# Patient Record
Sex: Female | Born: 1950 | Race: Black or African American | Hispanic: No | Marital: Married | Smoking: Former smoker
Health system: Southern US, Community
[De-identification: ages and names within clinical notes are randomized; demographics above are authoritative.]

## PROBLEM LIST (undated history)

## (undated) DIAGNOSIS — R011 Cardiac murmur, unspecified: Secondary | ICD-10-CM

## (undated) DIAGNOSIS — Z923 Personal history of irradiation: Secondary | ICD-10-CM

## (undated) DIAGNOSIS — I2699 Other pulmonary embolism without acute cor pulmonale: Secondary | ICD-10-CM

## (undated) DIAGNOSIS — C189 Malignant neoplasm of colon, unspecified: Secondary | ICD-10-CM

## (undated) DIAGNOSIS — D649 Anemia, unspecified: Secondary | ICD-10-CM

## (undated) HISTORY — DX: Malignant neoplasm of colon, unspecified: C18.9

---

## 2001-03-19 ENCOUNTER — Encounter: Payer: Self-pay | Admitting: Emergency Medicine

## 2001-03-19 ENCOUNTER — Emergency Department (HOSPITAL_COMMUNITY): Admission: EM | Admit: 2001-03-19 | Discharge: 2001-03-20 | Payer: Self-pay | Admitting: Emergency Medicine

## 2001-11-19 ENCOUNTER — Emergency Department (HOSPITAL_COMMUNITY): Admission: EM | Admit: 2001-11-19 | Discharge: 2001-11-19 | Payer: Self-pay | Admitting: Emergency Medicine

## 2002-03-13 ENCOUNTER — Inpatient Hospital Stay (HOSPITAL_COMMUNITY): Admission: RE | Admit: 2002-03-13 | Discharge: 2002-03-15 | Payer: Self-pay | Admitting: Obstetrics and Gynecology

## 2003-07-06 HISTORY — PX: ABDOMINAL HYSTERECTOMY: SHX81

## 2012-03-25 ENCOUNTER — Emergency Department (HOSPITAL_COMMUNITY)
Admission: EM | Admit: 2012-03-25 | Discharge: 2012-03-25 | Disposition: A | Discharge status: Home / Self Care | Payer: Self-pay | Source: Home / Self Care | Attending: Emergency Medicine | Admitting: Emergency Medicine

## 2012-03-25 ENCOUNTER — Encounter (HOSPITAL_COMMUNITY): Payer: Self-pay | Admitting: Emergency Medicine

## 2012-03-25 DIAGNOSIS — IMO0002 Reserved for concepts with insufficient information to code with codable children: Secondary | ICD-10-CM

## 2012-03-25 MED ORDER — CEPHALEXIN 500 MG PO CAPS: 500.0000 mg | ORAL_CAPSULE | Freq: Three times a day (TID) | ORAL | Status: DC

## 2012-03-25 MED ORDER — SULFAMETHOXAZOLE-TMP DS 800-160 MG PO TABS: 2.0000 | ORAL_TABLET | Freq: Two times a day (BID) | ORAL | Status: DC

## 2012-03-25 NOTE — ED Notes (Signed)
Spoke to pharmacist calling requesting clarification of septra order: dr Lorenz Coaster verified septra script correct as written.

## 2012-03-25 NOTE — ED Notes (Signed)
Pt c/o pain on her right ring finger x3 days... Says she pulled back part of the nail to far back and now it looks infected.. Sx include: throbbing pain, tender.... Denies: fevers, vomiting, nausea, diarrhea.

## 2012-03-25 NOTE — ED Provider Notes (Signed)
Chief Complaint  Patient presents with  . Hand Pain    History of Present Illness:   [name] [name] is a 61 year old female who has had a two-day history of swelling over the nail fold of her right ring finger. She had a hangnail which he pulled off, then it got red, swollen, and tender. She has not have a collection of pus under the nail fold. She has a full range of motion of all her joints. She denies any numbness or tingling.  Review of Systems:  Other than noted above, the patient denies any of the following symptoms: Systemic:  No fevers, chills, sweats, or aches.  No fatigue or tiredness. Musculoskeletal:  No joint pain, arthritis, bursitis, swelling, back pain, or neck pain. Neurological:  No muscular weakness, paresthesias, headache, or trouble with speech or coordination.  No dizziness.  PMFSH:  Past medical history, family history, social history, meds, and allergies were reviewed.  Physical Exam:   Vital signs:  There were no vitals taken for this visit. Gen:  Alert and oriented times 3.  In no distress. Musculoskeletal: There is swelling, erythema, and tenderness to palpation over the nail fold of the right ring finger. There is no collection of pus. The erythema does not extend below the PIP joint. She has full range of motion of flexor and extensor tendons. There's no pain to palpation over the flexor or extensor tendons. Otherwise, all joints had a full a ROM with no swelling, bruising or deformity.  No edema, pulses full. Extremities were warm and pink.  Capillary refill was brisk.  Skin:  Clear, warm and dry.  No rash. Neuro:  Alert and oriented times 3.  Muscle strength was normal.  Sensation was intact to light touch.   Assessment:  The encounter diagnosis was Paronychia.  Plan:   1.  The following meds were prescribed:   New Prescriptions   CEPHALEXIN (KEFLEX) 500 MG CAPSULE    Take 1 capsule (500 mg total) by mouth 3 (three) times daily.   SULFAMETHOXAZOLE-TRIMETHOPRIM  (BACTRIM DS) 800-160 MG PER TABLET    Take 2 tablets by mouth 2 (two) times daily.   2.  The patient was instructed in symptomatic care, including rest and activity, elevation, application of ice and compression.  Appropriate handouts were given. 3.  The patient was told to return if becoming worse in any way, if no better in 3 or 4 days, and given some red flag symptoms that would indicate earlier return.   4.  The patient was told to follow up here if no better in 48 hours or sooner if it should get any worse particularly with progression of the redness and swelling below the DIP joint.   Reuben Likes, MD 03/25/12 1007

## 2012-03-30 ENCOUNTER — Emergency Department (INDEPENDENT_AMBULATORY_CARE_PROVIDER_SITE_OTHER)
Admission: EM | Admit: 2012-03-30 | Discharge: 2012-03-30 | Disposition: A | Discharge status: Home / Self Care | Payer: Self-pay | Source: Home / Self Care | Attending: Emergency Medicine | Admitting: Emergency Medicine

## 2012-03-30 ENCOUNTER — Encounter (HOSPITAL_COMMUNITY): Payer: Self-pay | Admitting: *Deleted

## 2012-03-30 DIAGNOSIS — IMO0002 Reserved for concepts with insufficient information to code with codable children: Secondary | ICD-10-CM

## 2012-03-30 NOTE — ED Provider Notes (Signed)
Chief Complaint  Patient presents with  . Finger Injury    History of Present Illness:   [name] [name] is a 61 year old female who returns today for followup on paronychia involving her right ring finger. She was seen for this 6 days ago. At that time there was some swelling but no collection of pus. She was given cephalexin and Bactrim which she has been taking. The swelling and pain have gone down, but she now has a collection of pus encompassing the entire nail fold.  Review of Systems:  Other than noted above, the patient denies any of the following symptoms: Systemic:  No fevers, chills, sweats, or aches.  No fatigue or tiredness. Musculoskeletal:  No joint pain, arthritis, bursitis, swelling, back pain, or neck pain. Neurological:  No muscular weakness, paresthesias, headache, or trouble with speech or coordination.  No dizziness.  PMFSH:  Past medical history, family history, social history, meds, and allergies were reviewed.  Physical Exam:   Vital signs:  BP 122/53  Pulse 87  Temp 99.1 F (37.3 C) (Oral)  Resp 18  SpO2 100% Gen:  Alert and oriented times 3.  In no distress. Musculoskeletal: There is a collection of pus under the entire nail fold. This did not extend underneath the nail. There is no tenderness to palpation. No erythema it does not extend down below the  DIP joint or onto the volar aspect of the finger. Otherwise, all joints had a full a ROM with no swelling, bruising or deformity.  No edema, pulses full. Extremities were warm and pink.  Capillary refill was brisk.  Skin:  Clear, warm and dry.  No rash. Neuro:  Alert and oriented times 3.  Muscle strength was normal.  Sensation was intact to light touch.     Procedure Note:  Verbal informed consent was obtained from the patient.  Risks and benefits were outlined with the patient.  Patient understands and accepts these risks.  Identity of the patient was confirmed verbally and by armband.    Procedure was performed as  followed:  The entire nail fold was prepped with alcohol and a U-shaped incision was made around the nail fold. A moderate amount of pus was obtained which was cultured. A loose fitting dressing was applied and the patient was instructed in wound care.  Patient tolerated the procedure well without any immediate complications.  Assessment:  The encounter diagnosis was Paronychia.  Plan:   1.  The following meds were prescribed:   New Prescriptions   No medications on file   2.  The patient was instructed in symptomatic care, including rest and activity, elevation, application of ice and compression.  Appropriate handouts were given. 3.  The patient was told to return if becoming worse in any way, if no better in 3 or 4 days, and given some red flag symptoms that would indicate earlier return.   4.  The patient was told to follow up here in 5 days for recheck.   Reuben Likes, MD 03/30/12 2224

## 2012-03-30 NOTE — ED Notes (Signed)
Pt reports finger injury from hangnail - evaluated here on Saturday and given 2 antibiotics (currently taking) - nail bed and finger tip has continued to swell, tightness and difficult to bend

## 2012-04-02 LAB — CULTURE, ROUTINE-ABSCESS

## 2012-04-03 NOTE — ED Notes (Signed)
Abscess culture R hand: few Staph. Aureus.  Pt. adequately treated with Bactrim DS.  Keflex not non sensitivity. Vassie Moselle 04/03/2012

## 2013-05-22 ENCOUNTER — Emergency Department (HOSPITAL_COMMUNITY)
Admission: EM | Admit: 2013-05-22 | Discharge: 2013-05-22 | Disposition: A | Discharge status: Home / Self Care | Payer: Medicaid Other | Attending: Emergency Medicine | Admitting: Emergency Medicine

## 2013-05-22 ENCOUNTER — Encounter (HOSPITAL_COMMUNITY): Payer: Self-pay | Admitting: Emergency Medicine

## 2013-05-22 ENCOUNTER — Emergency Department (HOSPITAL_COMMUNITY): Payer: Medicaid Other

## 2013-05-22 DIAGNOSIS — Z87891 Personal history of nicotine dependence: Secondary | ICD-10-CM | POA: Insufficient documentation

## 2013-05-22 DIAGNOSIS — Z885 Allergy status to narcotic agent status: Secondary | ICD-10-CM | POA: Insufficient documentation

## 2013-05-22 DIAGNOSIS — R1084 Generalized abdominal pain: Secondary | ICD-10-CM | POA: Insufficient documentation

## 2013-05-22 DIAGNOSIS — R141 Gas pain: Secondary | ICD-10-CM | POA: Insufficient documentation

## 2013-05-22 DIAGNOSIS — R935 Abnormal findings on diagnostic imaging of other abdominal regions, including retroperitoneum: Secondary | ICD-10-CM | POA: Insufficient documentation

## 2013-05-22 DIAGNOSIS — I10 Essential (primary) hypertension: Secondary | ICD-10-CM | POA: Insufficient documentation

## 2013-05-22 DIAGNOSIS — K5900 Constipation, unspecified: Secondary | ICD-10-CM

## 2013-05-22 DIAGNOSIS — R9389 Abnormal findings on diagnostic imaging of other specified body structures: Secondary | ICD-10-CM

## 2013-05-22 DIAGNOSIS — R142 Eructation: Secondary | ICD-10-CM | POA: Insufficient documentation

## 2013-05-22 DIAGNOSIS — Z79899 Other long term (current) drug therapy: Secondary | ICD-10-CM | POA: Insufficient documentation

## 2013-05-22 LAB — URINE MICROSCOPIC-ADD ON

## 2013-05-22 LAB — COMPREHENSIVE METABOLIC PANEL
ALT: 15 U/L (ref 0–35)
Albumin: 4 g/dL (ref 3.5–5.2)
BUN: 17 mg/dL (ref 6–23)
Chloride: 101 mEq/L (ref 96–112)
Creatinine, Ser: 0.77 mg/dL (ref 0.50–1.10)
GFR calc Af Amer: >90 mL/min (ref >90)
GFR calc non Af Amer: 89 mL/min — ABNORMAL LOW (ref >90)
Glucose, Bld: 116 mg/dL — ABNORMAL HIGH (ref 70–99)
Total Bilirubin: 0.2 mg/dL — ABNORMAL LOW (ref 0.3–1.2)
Total Protein: 9.1 g/dL — ABNORMAL HIGH (ref 6.0–8.3)

## 2013-05-22 LAB — LIPASE, BLOOD: Lipase: 39 U/L (ref 11–59)

## 2013-05-22 LAB — CBC WITH DIFFERENTIAL/PLATELET
Eosinophils Absolute: 0 10*3/uL (ref 0.0–0.7)
Eosinophils Relative: 0 % (ref 0–5)
Lymphocytes Relative: 8 % — ABNORMAL LOW (ref 12–46)
Lymphs Abs: 0.8 10*3/uL (ref 0.7–4.0)
MCHC: 34.5 g/dL (ref 30.0–36.0)
MCV: 82.9 fL (ref 78.0–100.0)
Neutro Abs: 8.5 10*3/uL — ABNORMAL HIGH (ref 1.7–7.7)
Neutrophils Relative %: 89 % — ABNORMAL HIGH (ref 43–77)
Platelets: 303 10*3/uL (ref 150–400)
RBC: 4.51 MIL/uL (ref 3.87–5.11)
WBC: 9.5 10*3/uL (ref 4.0–10.5)

## 2013-05-22 LAB — URINALYSIS, ROUTINE W REFLEX MICROSCOPIC
Bilirubin Urine: NEGATIVE
Glucose, UA: NEGATIVE mg/dL
Ketones, ur: NEGATIVE mg/dL
Nitrite: NEGATIVE
Urobilinogen, UA: 0.2 mg/dL (ref 0.0–1.0)

## 2013-05-22 MED ORDER — HYDROMORPHONE HCL PF 1 MG/ML IJ SOLN: 0.5000 mg | Freq: Once | INTRAMUSCULAR | Status: DC

## 2013-05-22 MED ORDER — FLEET ENEMA 7-19 GM/118ML RE ENEM
1.0000 | ENEMA | Freq: Once | RECTAL | Status: AC
  Administered 2013-05-22: 1 via RECTAL
  Filled 2013-05-22: qty 1

## 2013-05-22 MED ORDER — PEG 3350-KCL-NA BICARB-NACL 420 G PO SOLR
4000.0000 mL | Freq: Once | ORAL | Status: AC
  Administered 2013-05-22: 4000 mL via ORAL
  Filled 2013-05-22: qty 4000

## 2013-05-22 MED ORDER — FLEET ENEMA 7-19 GM/118ML RE ENEM: 1.0000 | ENEMA | Freq: Once | RECTAL | Status: DC

## 2013-05-22 MED ORDER — HYDROMORPHONE HCL PF 1 MG/ML IJ SOLN
1.0000 mg | Freq: Once | INTRAMUSCULAR | Status: AC
  Administered 2013-05-22: 1 mg via INTRAVENOUS
  Filled 2013-05-22: qty 1

## 2013-05-22 MED ORDER — HYDROCODONE-ACETAMINOPHEN 5-325 MG PO TABS: 1.0000 | ORAL_TABLET | ORAL | Status: DC | PRN

## 2013-05-22 MED ORDER — IOHEXOL 300 MG/ML  SOLN
25.0000 mL | INTRAMUSCULAR | Status: AC
  Administered 2013-05-22: 25 mL via ORAL

## 2013-05-22 MED ORDER — ONDANSETRON HCL 4 MG/2ML IJ SOLN
4.0000 mg | Freq: Once | INTRAMUSCULAR | Status: AC
  Administered 2013-05-22: 4 mg via INTRAVENOUS
  Filled 2013-05-22: qty 2

## 2013-05-22 MED ORDER — PEG 3350-KCL-NA BICARB-NACL 420 G PO SOLR: 4000.0000 mL | Freq: Once | ORAL | Status: DC

## 2013-05-22 MED ORDER — ONDANSETRON HCL 4 MG PO TABS: 4.0000 mg | ORAL_TABLET | Freq: Four times a day (QID) | ORAL | Status: DC

## 2013-05-22 MED ORDER — IOHEXOL 300 MG/ML  SOLN
80.0000 mL | Freq: Once | INTRAMUSCULAR | Status: AC | PRN
  Administered 2013-05-22: 80 mL via INTRAVENOUS

## 2013-05-22 MED ORDER — MORPHINE SULFATE 4 MG/ML IJ SOLN
4.0000 mg | Freq: Once | INTRAMUSCULAR | Status: AC
  Administered 2013-05-22: 4 mg via INTRAVENOUS
  Filled 2013-05-22: qty 1

## 2013-05-22 NOTE — Progress Notes (Signed)
Met patient at bedside Case Manager role explained,patient verbalizes her understanding.Patient reports Increased abdominal pain as reason for ED visit. Patient reports she does not have a PCP.Verbal / Written Education provided for the [facility] and Urgent care.Education provided on importance Of Establishing a PCP.Patient provided with a [employer].No further Case Manager needs identified.

## 2013-05-22 NOTE — ED Notes (Signed)
Pt discharged.Vital signs stable and GCS 15.Discharge instruction given. 

## 2013-05-22 NOTE — ED Notes (Signed)
Pt reports generalized abdominal pain with nausea/vomiting since last night. No fever.

## 2013-05-22 NOTE — ED Notes (Signed)
Family very upset and not happy for pt to be discharged.They talked down to the PA.Pt took the oral polyethylene glyco with her to drink.Instructions given.Pt verbalizses understanding.

## 2013-05-22 NOTE — ED Provider Notes (Signed)
CSN: [id number]     Arrival date & time 05/22/13  0825 History   First MD Initiated Contact with Patient 05/22/13 [phone]     Chief Complaint  Patient presents with  . Nausea  . Emesis  . Abdominal Pain   (Consider location/radiation/quality/duration/timing/severity/associated sxs/prior Treatment) HPI No pcp Never had colonoscopy  HAISLEE CORSO is a 62 y.o.female with a significant PMH of hypertension presents to the ER with complaints of abdominal pain that started last night. She has had in the distant past symptoms like this before but they always resolved on their own,. The pain is mod/severe and patient requests medication to help with the pain.   Abdominal Pain  Pain location: diffuse Pain quality: sharp and dull Pain radiates to: Does not radiate  Pain severity: Moderate/sever  Onset quality: gradual Duration: since last night  Timing: Intermittent  Progression: Waxing and waning  Chronicity: recurrent Context: not diet changes, not recent illness, not suspicious food intake and not trauma  Relieved by: None tried  Ineffective treatments: None tried Associated symptoms: vomiting  Associated symptoms: no constipation, no diarrhea, no dysuria, no fever, no flatus, no hematemesis, no hematochezia, no hematuria and no shortness of breath  Risk factors: no alcohol abuse    History reviewed. No pertinent past medical history. History reviewed. No pertinent past surgical history. No family history on file. History  Substance Use Topics  . Smoking status: Never Smoker   . Smokeless tobacco: Not on file  . Alcohol Use: No   OB History   Grav Para Term Preterm Abortions TAB SAB Ect Mult Living                 Review of Systems The patient denies anorexia, fever, weight loss, vision loss, decreased hearing, hoarseness, chest pain, syncope, dyspnea on exertion, peripheral edema, balance deficits, hemoptysis, melena, hematochezia, severe indigestion/heartburn, hematuria,  incontinence, genital sores, muscle weakness, suspicious skin lesions, transient blindness, difficulty walking, depression, unusual weight change, abnormal bleeding, enlarged lymph nodes, angioedema, and breast masses.  Allergies  Codeine  Home Medications   Current Outpatient Rx  Name  Route  Sig  Dispense  Refill  . CALCIUM-MAGNESIUM-ZINC PO   Oral   Take 1 tablet by mouth daily. Fullmulsion         . CHLOROPHYLL PO   Oral   Take 2 tablets by mouth daily.         . Cholecalciferol (VITAMIN D PO)   Oral   Take 1 tablet by mouth daily.         . Cyanocobalamin (VITAMIN B 12 PO)   Oral   Take 1 tablet by mouth daily.         Marland Kitchen OVER THE COUNTER MEDICATION   Oral   Take 1 tablet by mouth daily. Fu          BP 119/66  Pulse 101  Temp(Src) 98.7 F (37.1 C) (Oral)  Resp 18  SpO2 98% Physical Exam  Nursing note and vitals reviewed. Constitutional: She appears well-developed and well-nourished. No distress.  HENT:  Head: Normocephalic and atraumatic.  Eyes: Pupils are equal, round, and reactive to light.  Neck: Normal range of motion. Neck supple.  Cardiovascular: Normal rate and regular rhythm.   Pulmonary/Chest: Effort normal.  Abdominal: Soft. Bowel sounds are normal. She exhibits distension (mild). She exhibits no ascites and no mass. There is tenderness (diffuse). There is guarding (involuntary). There is no CVA tenderness.    Neurological: She is alert.  Skin: Skin is warm and dry.    ED Course  Procedures (including critical care time) Labs Review Labs Reviewed  URINALYSIS, ROUTINE W REFLEX MICROSCOPIC - Abnormal; Notable for the following:    APPearance CLOUDY (*)    Specific Gravity, Urine 1.042 (*)    Leukocytes, UA SMALL (*)    All other components within normal limits  COMPREHENSIVE METABOLIC PANEL - Abnormal; Notable for the following:    Glucose, Bld 116 (*)    Calcium 12.4 (*)    Total Protein 9.1 (*)    Total Bilirubin 0.2 (*)    GFR  calc non Af Amer 89 (*)    All other components within normal limits  CBC WITH DIFFERENTIAL - Abnormal; Notable for the following:    Neutrophils Relative % 89 (*)    Neutro Abs 8.5 (*)    Lymphocytes Relative 8 (*)    All other components within normal limits  LIPASE, BLOOD  URINE MICROSCOPIC-ADD ON   Imaging Review Ct Abdomen Pelvis W Contrast  05/22/2013   CLINICAL DATA:  Generalized abdominal pain  EXAM: CT ABDOMEN AND PELVIS WITH CONTRAST  TECHNIQUE: Multidetector CT imaging of the abdomen and pelvis was performed using the standard protocol following bolus administration of intravenous contrast.  CONTRAST:  80mL OMNIPAQUE IOHEXOL 300 MG/ML  SOLN  COMPARISON:  None.  FINDINGS: There is a 5.6 mm left lower lobe pulmonary nodule (image 6/series 3).  There is a low-attenuation area in the left hepatic lobe adjacent to the falciform ligament measuring 3 x 3.3 cm with vessels coursing through this area of low-attenuation suggesting an area of focal fatty deposition. There is a 8.2 x 5.1 cm hypodense mass in the right hepatic lobe with peripheral nodular enhancement with progressive filling in on delayed phase imaging most consistent with a hemangioma. There is a smaller similar mass measuring 2 x 1.3 cm in the right hepatic lobe near the dome of the liver with peripheral nodular enhancement. There is no intrahepatic or extrahepatic biliary ductal dilatation. The gallbladder is normal. The spleen demonstrates no focal abnormality. There are nonobstructing left renal calculi with the largest measuring 9 mm. The right kidney, adrenal glands and pancreas are normal. The bladder is unremarkable.  The stomach, duodenum, small intestine, and large intestine demonstrate no contrast extravasation or dilatation. There is a large amount of stool throughout the colon. There is a normal caliber appendix in the right lower quadrant without periappendiceal inflammatory changes.There is a relative area of bowel wall  thickening with shouldering of the proximal and distal margins, involving the rectosigmoid junction with slight haziness along the right lateral aspect of the bowel wall. There is no pneumoperitoneum, pneumatosis, or portal venous gas. There is no abdominal or pelvic free fluid. There is no lymphadenopathy.  The abdominal aorta is normal in caliber with atherosclerosis.  There are no lytic or sclerotic osseous lesions.  IMPRESSION: 1. There is a relative area of bowel wall thickening with shouldering of the proximal and distal margins, involving the rectosigmoid junction with slight haziness along the right lateral aspect of the bowel wall. The area is concerning for malignancy. Recommend further evaluation with colonoscopy.  There is a large amount of stool throughout the colon proximal to this area.  2. There are 2 hypodense right hepatic mass is with peripheral nodular enhancement most consistent with hemangiomas.  3.  Nonobstructing left renal calculi.  4. There is a low-attenuation area in the left hepatic lobe adjacent to the falciform ligament  measuring 3 x 3.3 cm with vessels coursing through this area of low-attenuation suggesting an area of focal fatty deposition. If there is further clinical concern recommend an MRI of the abdomen without and with intravenous contrast.   Electronically Signed   By: Elige Ko   On: 05/22/2013 10:20    EKG Interpretation   None       MDM   1. Constipation   2. Abnormal finding on CT scan    Patients pain was controlled with IV analgesics. Her CT scan shows that she has a large amount of stool throughout the colon proximal to an area of slight haziness and the rectosigmoid junction concerning for malignancy. Colonoscopy is recommended. I spoke with on-call GI, did not look at the CT but is not entirely convinced that this is cancerious just from the CT reading. However, it is of the UPMOST importance that the patient have a colonoscopy for further  evaluation. If she calls the [place] office, she can schedule an appointment with a midlevel or physician.   I discussed these results with the patient and her family members. They voice their understanding that they need follow-up. Discussed case with Dr. Rubin Payor, will rx enema. Enema did not work, will give bottle of golytely for home.  62 y.o.Concepcion Elk Enzor's evaluation in the Emergency Department is complete. It has been determined that no acute conditions requiring further emergency intervention are present at this time. The patient/guardian have been advised of the diagnosis and plan. We have discussed signs and symptoms that warrant return to the ED, such as changes or worsening in symptoms.  Vital signs are stable at discharge. Filed Vitals:   05/22/13 1134  BP: 119/66  Pulse: 101  Temp: 98.7 F (37.1 C)  Resp: 18    Patient/guardian has voiced understanding and agreed to follow-up with the PCP or specialist.     Dorthula Matas, PA-C 05/22/13 1228  Dorthula Matas, PA-C 05/22/13 1233  Dorthula Matas, PA-C 05/22/13 1413

## 2013-05-22 NOTE — ED Notes (Signed)
Informed CT that pt had her contrast.

## 2013-05-23 ENCOUNTER — Inpatient Hospital Stay (HOSPITAL_COMMUNITY)
Admission: EM | Admit: 2013-05-23 | Discharge: 2013-05-27 | DRG: 375 | Disposition: A | Discharge status: Home / Self Care | Payer: Medicaid Other | Attending: Family Medicine | Admitting: Family Medicine

## 2013-05-23 ENCOUNTER — Encounter (HOSPITAL_COMMUNITY): Payer: Self-pay | Admitting: Emergency Medicine

## 2013-05-23 ENCOUNTER — Encounter: Payer: Self-pay | Admitting: Gastroenterology

## 2013-05-23 DIAGNOSIS — C189 Malignant neoplasm of colon, unspecified: Principal | ICD-10-CM | POA: Diagnosis present

## 2013-05-23 DIAGNOSIS — K5900 Constipation, unspecified: Secondary | ICD-10-CM | POA: Diagnosis present

## 2013-05-23 DIAGNOSIS — E860 Dehydration: Secondary | ICD-10-CM | POA: Diagnosis present

## 2013-05-23 DIAGNOSIS — R112 Nausea with vomiting, unspecified: Secondary | ICD-10-CM | POA: Diagnosis present

## 2013-05-23 DIAGNOSIS — R970 Elevated carcinoembryonic antigen [CEA]: Secondary | ICD-10-CM | POA: Diagnosis present

## 2013-05-23 DIAGNOSIS — R634 Abnormal weight loss: Secondary | ICD-10-CM | POA: Diagnosis present

## 2013-05-23 DIAGNOSIS — R140 Abdominal distension (gaseous): Secondary | ICD-10-CM | POA: Diagnosis present

## 2013-05-23 DIAGNOSIS — Z87891 Personal history of nicotine dependence: Secondary | ICD-10-CM

## 2013-05-23 DIAGNOSIS — K5669 Other intestinal obstruction: Secondary | ICD-10-CM | POA: Diagnosis present

## 2013-05-23 DIAGNOSIS — Z79899 Other long term (current) drug therapy: Secondary | ICD-10-CM

## 2013-05-23 DIAGNOSIS — K56609 Unspecified intestinal obstruction, unspecified as to partial versus complete obstruction: Secondary | ICD-10-CM | POA: Diagnosis present

## 2013-05-23 DIAGNOSIS — C787 Secondary malignant neoplasm of liver and intrahepatic bile duct: Secondary | ICD-10-CM | POA: Diagnosis present

## 2013-05-23 DIAGNOSIS — R142 Eructation: Secondary | ICD-10-CM | POA: Diagnosis present

## 2013-05-23 DIAGNOSIS — R109 Unspecified abdominal pain: Secondary | ICD-10-CM

## 2013-05-23 DIAGNOSIS — Z800 Family history of malignant neoplasm of digestive organs: Secondary | ICD-10-CM

## 2013-05-23 DIAGNOSIS — R141 Gas pain: Secondary | ICD-10-CM | POA: Diagnosis present

## 2013-05-23 DIAGNOSIS — K6289 Other specified diseases of anus and rectum: Secondary | ICD-10-CM

## 2013-05-23 LAB — BASIC METABOLIC PANEL
CO2: 24 mEq/L (ref 19–32)
Calcium: 12 mg/dL — ABNORMAL HIGH (ref 8.4–10.5)
Chloride: 104 mEq/L (ref 96–112)
GFR calc Af Amer: >90 mL/min (ref >90)
GFR calc non Af Amer: 78 mL/min — ABNORMAL LOW (ref >90)
Glucose, Bld: 118 mg/dL — ABNORMAL HIGH (ref 70–99)
Potassium: 4.5 mEq/L (ref 3.5–5.1)
Sodium: 138 mEq/L (ref 135–145)

## 2013-05-23 MED ORDER — ONDANSETRON HCL 4 MG/2ML IJ SOLN
4.0000 mg | Freq: Three times a day (TID) | INTRAMUSCULAR | Status: AC | PRN
  Administered 2013-05-23: 4 mg via INTRAVENOUS
  Filled 2013-05-23: qty 2

## 2013-05-23 MED ORDER — POLYETHYLENE GLYCOL 3350 17 G PO PACK
17.0000 g | PACK | Freq: Every day | ORAL | Status: DC
  Filled 2013-05-23 (×3): qty 1

## 2013-05-23 MED ORDER — HYDROCODONE-ACETAMINOPHEN 10-325 MG PO TABS
1.0000 | ORAL_TABLET | Freq: Once | ORAL | Status: AC
  Administered 2013-05-23: 1 via ORAL
  Filled 2013-05-23: qty 1

## 2013-05-23 MED ORDER — ACETAMINOPHEN 325 MG PO TABS: 650.0000 mg | ORAL_TABLET | Freq: Four times a day (QID) | ORAL | Status: DC | PRN

## 2013-05-23 MED ORDER — ACETAMINOPHEN 650 MG RE SUPP: 650.0000 mg | Freq: Four times a day (QID) | RECTAL | Status: DC | PRN

## 2013-05-23 MED ORDER — KETOROLAC TROMETHAMINE 30 MG/ML IJ SOLN
30.0000 mg | Freq: Once | INTRAMUSCULAR | Status: AC
  Administered 2013-05-23: 30 mg via INTRAVENOUS

## 2013-05-23 MED ORDER — SODIUM CHLORIDE 0.9 % IV SOLN
INTRAVENOUS | Status: DC
  Administered 2013-05-23 – 2013-05-27 (×5): via INTRAVENOUS

## 2013-05-23 MED ORDER — ENOXAPARIN SODIUM 40 MG/0.4ML ~~LOC~~ SOLN
40.0000 mg | SUBCUTANEOUS | Status: DC
  Administered 2013-05-23 – 2013-05-26 (×4): 40 mg via SUBCUTANEOUS
  Filled 2013-05-23 (×5): qty 0.4

## 2013-05-23 MED ORDER — KETOROLAC TROMETHAMINE 60 MG/2ML IM SOLN: 30.0000 mg | Freq: Once | INTRAMUSCULAR | Status: DC

## 2013-05-23 MED ORDER — ONDANSETRON HCL 4 MG/2ML IJ SOLN: 4.0000 mg | Freq: Four times a day (QID) | INTRAMUSCULAR | Status: DC | PRN

## 2013-05-23 MED ORDER — KETOROLAC TROMETHAMINE 30 MG/ML IJ SOLN
30.0000 mg | Freq: Once | INTRAMUSCULAR | Status: DC
  Filled 2013-05-23: qty 1

## 2013-05-23 MED ORDER — HYDROCODONE-ACETAMINOPHEN 5-325 MG PO TABS: 2.0000 | ORAL_TABLET | Freq: Once | ORAL | Status: AC

## 2013-05-23 MED ORDER — ONDANSETRON HCL 4 MG PO TABS: 4.0000 mg | ORAL_TABLET | Freq: Four times a day (QID) | ORAL | Status: DC | PRN

## 2013-05-23 MED ORDER — SODIUM CHLORIDE 0.9 % IV BOLUS (SEPSIS)
1000.0000 mL | Freq: Once | INTRAVENOUS | Status: AC
  Administered 2013-05-23: 1000 mL via INTRAVENOUS

## 2013-05-23 MED ORDER — LIDOCAINE HCL 2 % EX GEL
Freq: Once | CUTANEOUS | Status: DC
  Filled 2013-05-23: qty 10

## 2013-05-23 MED ORDER — KETOROLAC TROMETHAMINE 15 MG/ML IJ SOLN
15.0000 mg | Freq: Four times a day (QID) | INTRAMUSCULAR | Status: DC | PRN
  Administered 2013-05-24 (×4): 15 mg via INTRAVENOUS
  Filled 2013-05-23 (×4): qty 1

## 2013-05-23 NOTE — ED Notes (Signed)
Admitting Doctor at bedside 

## 2013-05-23 NOTE — ED Notes (Signed)
MD at bedside. 

## 2013-05-23 NOTE — Progress Notes (Signed)
Patient tem 101 given incentive spirometer and instructor on use.  Asked patient to use machine x2 in 15 min for temp recheck.  Patient rechecked after patient use of is and temp decrease to 98.3

## 2013-05-23 NOTE — ED Notes (Signed)
Pt c/o abd pain, abd distention, constipation, n/v that started on Monday. Pt was seen yesterday at [facility] but discharged last night.

## 2013-05-23 NOTE — ED Provider Notes (Signed)
CSN: [id number]     Arrival date & time 05/23/13  1655 History   First MD Initiated Contact with Patient 05/23/13 1723     Chief Complaint  Patient presents with  . Abdominal Pain   (Consider location/radiation/quality/duration/timing/severity/associated sxs/prior Treatment) HPI  LEDORA DELKER is a 62 y.o. female with no significant past medical history, however the patient has not had primary care in many decades, complaining of abdominal pain, constipation and increasing abdominal distention. Patient was seen for similar yesterday, had CT scan showing hypertrophy in the rectosigmoid colon which was read as suspicious for malignancy. GI was consulted and patient was given Fleet enema, Vicodin, and Zofran. Patient returns today with increasing pain. Patient does endorse a recent weight loss, denies any easy bruising or bleeding. Denies melena, endorses intermittent blood in stool over the last several months.  History reviewed. No pertinent past medical history. Past Surgical History  Procedure Laterality Date  . Abdominal hysterectomy     No family history on file. History  Substance Use Topics  . Smoking status: Never Smoker   . Smokeless tobacco: Not on file  . Alcohol Use: No   OB History   Grav Para Term Preterm Abortions TAB SAB Ect Mult Living                 Review of Systems 10 systems reviewed and found to be negative, except as noted in the HPI  Allergies  Codeine  Home Medications   Current Outpatient Rx  Name  Route  Sig  Dispense  Refill  . CALCIUM-MAGNESIUM-ZINC PO   Oral   Take 1 tablet by mouth daily. Fullmulsion         . CHLOROPHYLL PO   Oral   Take 2 tablets by mouth daily.         . Cholecalciferol (VITAMIN D PO)   Oral   Take 1 tablet by mouth daily.         . Cyanocobalamin (VITAMIN B 12 PO)   Oral   Take 1 tablet by mouth daily.         Marland Kitchen HYDROcodone-acetaminophen (NORCO/VICODIN) 5-325 MG per tablet   Oral   Take 1-2 tablets  by mouth every 4 (four) hours as needed.   10 tablet   0   . ondansetron (ZOFRAN) 4 MG tablet   Oral   Take 1 tablet (4 mg total) by mouth every 6 (six) hours.   12 tablet   0   . sodium phosphate (FLEET) 7-19 GM/118ML ENEM   Rectal   Place 1 enema rectally once.   1 enema   1    BP 159/57  Pulse 108  Resp 18  Ht 5' 5.5" (1.664 m)  Wt 150 lb (68.04 kg)  BMI 24.57 kg/m2  SpO2 97% Physical Exam  Nursing note and vitals reviewed. Constitutional: She is oriented to person, place, and time. She appears well-developed and well-nourished. No distress.  HENT:  Head: Normocephalic.  Mouth/Throat: Oropharynx is clear and moist.  No conjunctival pallor  Eyes: Conjunctivae and EOM are normal. Pupils are equal, round, and reactive to light.  Cardiovascular: Normal rate.   Pulmonary/Chest: Effort normal and breath sounds normal. No stridor. No respiratory distress. She has no wheezes. She has no rales. She exhibits no tenderness.  Abdominal: She exhibits distension. She exhibits no mass. There is tenderness. There is no rebound and no guarding.  Mild distention, diffusely tender to palpation with no guarding or rebound.  Musculoskeletal:  Normal range of motion.  Neurological: She is alert and oriented to person, place, and time.  Psychiatric: She has a normal mood and affect.    ED Course  Procedures (including critical care time) Labs Review Labs Reviewed - No data to display Imaging Review Ct Abdomen Pelvis W Contrast  05/22/2013   CLINICAL DATA:  Generalized abdominal pain  EXAM: CT ABDOMEN AND PELVIS WITH CONTRAST  TECHNIQUE: Multidetector CT imaging of the abdomen and pelvis was performed using the standard protocol following bolus administration of intravenous contrast.  CONTRAST:  80mL OMNIPAQUE IOHEXOL 300 MG/ML  SOLN  COMPARISON:  None.  FINDINGS: There is a 5.6 mm left lower lobe pulmonary nodule (image 6/series 3).  There is a low-attenuation area in the left hepatic lobe  adjacent to the falciform ligament measuring 3 x 3.3 cm with vessels coursing through this area of low-attenuation suggesting an area of focal fatty deposition. There is a 8.2 x 5.1 cm hypodense mass in the right hepatic lobe with peripheral nodular enhancement with progressive filling in on delayed phase imaging most consistent with a hemangioma. There is a smaller similar mass measuring 2 x 1.3 cm in the right hepatic lobe near the dome of the liver with peripheral nodular enhancement. There is no intrahepatic or extrahepatic biliary ductal dilatation. The gallbladder is normal. The spleen demonstrates no focal abnormality. There are nonobstructing left renal calculi with the largest measuring 9 mm. The right kidney, adrenal glands and pancreas are normal. The bladder is unremarkable.  The stomach, duodenum, small intestine, and large intestine demonstrate no contrast extravasation or dilatation. There is a large amount of stool throughout the colon. There is a normal caliber appendix in the right lower quadrant without periappendiceal inflammatory changes.There is a relative area of bowel wall thickening with shouldering of the proximal and distal margins, involving the rectosigmoid junction with slight haziness along the right lateral aspect of the bowel wall. There is no pneumoperitoneum, pneumatosis, or portal venous gas. There is no abdominal or pelvic free fluid. There is no lymphadenopathy.  The abdominal aorta is normal in caliber with atherosclerosis.  There are no lytic or sclerotic osseous lesions.  IMPRESSION: 1. There is a relative area of bowel wall thickening with shouldering of the proximal and distal margins, involving the rectosigmoid junction with slight haziness along the right lateral aspect of the bowel wall. The area is concerning for malignancy. Recommend further evaluation with colonoscopy.  There is a large amount of stool throughout the colon proximal to this area.  2. There are 2  hypodense right hepatic mass is with peripheral nodular enhancement most consistent with hemangiomas.  3.  Nonobstructing left renal calculi.  4. There is a low-attenuation area in the left hepatic lobe adjacent to the falciform ligament measuring 3 x 3.3 cm with vessels coursing through this area of low-attenuation suggesting an area of focal fatty deposition. If there is further clinical concern recommend an MRI of the abdomen without and with intravenous contrast.   Electronically Signed   By: Elige Ko   On: 05/22/2013 10:20    EKG Interpretation   None       MDM   1. Abdominal pain   2. Abdominal distention   3. Constipation     Filed Vitals:   05/23/13 1717  BP: 159/57  Pulse: 108  Resp: 18  Height: 5' 5.5" (1.664 m)  Weight: 150 lb (68.04 kg)  SpO2: 97%     Gillian Shields is a  62 y.o. female presenting for further evaluation of abdominal pain, distention and constipation. CT performed yesterday showed hypertrophy and a retrocecal A.: That was read as suspicious for malignancy. Patient has not had a bowel movement since that time.  I and gastroenterology consult from [facility] gastroenterologist Dr. Richardine Service: He will round on the patient in the morning. He recommends a medical admission. Patient will be admitted to [facility] Dr. Gonzella Lex.   Medications  sodium chloride 0.9 % bolus 1,000 mL (not administered)  polyethylene glycol (MIRALAX / GLYCOLAX) packet 17 g (not administered)  lidocaine (XYLOCAINE) 2 % jelly (not administered)  ondansetron (ZOFRAN) injection 4 mg (not administered)  ketorolac (TORADOL) injection 30 mg (not administered)    Note: Portions of this report may have been transcribed using voice recognition software. Every effort was made to ensure accuracy; however, inadvertent computerized transcription errors may be present        Wynetta Emery, PA-C 05/23/13 1852

## 2013-05-23 NOTE — H&P (Addendum)
[facility] History and Physical  [name] [name] ZOX:[id number] DOB: 02-05-1951 DOA: 05/23/2013  Referring physician: ED PCP: No PCP Per Patient   Chief Complaint:  abdominal distention and constipation for 2 days  HPI:  62 year old African American female with no past medical history who presented to the ED yesterday with acute onset of abdominal distention with soreness and unable to have a bowel movement for one day. This was also associated with several episodes of nausea and vomiting of food particles overnight. Prior to this, for past  one month he shouldn't reports having loose bowel movements  about 3-4 episodes every day. Denies any blood in stool. She was seen in the ED and had a CT scan of her abdomen and pelvis done which showed area of bowel wall thickening around the rectosigmoid junction concerning for malignancy. Also showed a low attenuating  lesions over left lobe of liver. [facility] was consulted who recommended giving patient a Fleet enema. This did not relieve her constipation. GI then recommended giving her GoLYTELY and sending her home with outpatient GI followup. Patient however did not have improvement in her symptoms and still had episodes of nausea and vomiting and persistent abdominal distention without any bowel movement. She has been able to pass gas. She denies any fever, chills, headache, blurred vision, dizziness, chest pain, palpitations, dysuria, jaundice, weakness. She does report a weight loss of about 10 pounds in the past 6 weeks which was unintentional. She also reports that her sister was diagnosed of colon caner at age 38.   Course in the ED Given her persistent symptoms without improvement GI was again consulted and recommended admission to medical floor. [facility] called to admit the patient. Patient given a was of IV toradol and Phenergan with improvement in her symptoms.  Review of Systems:  Constitutional: Denies fever, chills,  diaphoresis, appetite change and fatigue.  HEENT: Denies photophobia, eye pain, redness, hearing loss, ear pain, congestion, sore throat, rhinorrhea, sneezing, mouth sores, trouble swallowing, neck pain, neck stiffness and tinnitus.   Respiratory: Denies SOB, DOE, cough, chest tightness,  and wheezing.   Cardiovascular: Denies chest pain, palpitations and leg swelling.  Gastrointestinal: Denies nausea, vomiting, abdominal pain, constipation and distention . denies diarrhea,  blood in stool Genitourinary: Denies dysuria, urgency, frequency, hematuria, flank pain and difficulty urinating.  Musculoskeletal: Denies myalgias, back pain, joint swelling, arthralgias and gait problem.  Neurological: Denies dizziness, seizures, syncope, weakness, light-headedness, numbness and headaches.  Hematological: Denies adenopathy.     History reviewed. No pertinent past medical history. Past Surgical History  Procedure Laterality Date  . Abdominal hysterectomy     Social History:  reports that she quit smoking about 42 years ago. She has never used smokeless tobacco. She reports that she does not drink alcohol or use illicit drugs.  Allergies  Allergen Reactions  . Codeine Itching and Nausea And Vomiting    History reviewed. No pertinent family history.  Prior to Admission medications   Medication Sig Start Date End Date Taking? Authorizing Provider  CALCIUM-MAGNESIUM-ZINC PO Take 1 tablet by mouth daily. Fullmulsion   Yes Historical Provider, MD  CHLOROPHYLL PO Take 2 tablets by mouth daily.   Yes Historical Provider, MD  Cholecalciferol (VITAMIN D PO) Take 1 tablet by mouth daily.   Yes Historical Provider, MD  Cyanocobalamin (VITAMIN B 12 PO) Take 1 tablet by mouth daily.   Yes Historical Provider, MD  HYDROcodone-acetaminophen (NORCO/VICODIN) 5-325 MG per tablet Take 1-2 tablets by mouth every 4 (  four) hours as needed. 05/22/13  Yes Tiffany Irine Seal, PA-C  ondansetron (ZOFRAN) 4 MG tablet Take 1  tablet (4 mg total) by mouth every 6 (six) hours. 05/22/13  Yes Tiffany Irine Seal, PA-C  sodium phosphate (FLEET) 7-19 GM/118ML ENEM Place 1 enema rectally once. 05/22/13  Yes Dorthula Matas, PA-C    Physical Exam:  Filed Vitals:   05/23/13 1717 05/23/13 1852 05/23/13 1938  BP: 159/57 153/68 158/69  Pulse: 108 91 95  Temp:   98.4 F (36.9 C)  TempSrc:   Oral  Resp: 18  16  Height: 5' 5.5" (1.664 m)    Weight: 68.04 kg (150 lb)    SpO2: 97% 99% 99%    Constitutional: Vital signs reviewed.  Middle aged Female lying in bed in no distress HEENT: No pallor, mild temporal wasting, no icterus, dry oral mucosa, no cervical or supraclavicular lymphadenopathy Cardiovascular: RRR, S1 normal, S2 normal, no MRG,  Pulmonary/Chest: CTAB, no wheezes, rales, or rhonchi Abdominal: Soft. Moderately Distended, tender to palpation over bilateral lower quadrants, bowel sounds present, no hepatomegaly   extremity: Warm, no edema CNS: AAO x3  Labs on Admission:  Basic Metabolic Panel:  Recent Labs Lab 05/22/13 0930  NA 136  K 4.9  CL 101  CO2 23  GLUCOSE 116*  BUN 17  CREATININE 0.77  CALCIUM 12.4*   Liver Function Tests:  Recent Labs Lab 05/22/13 0930  AST 26  ALT 15  ALKPHOS 89  BILITOT 0.2*  PROT 9.1*  ALBUMIN 4.0    Recent Labs Lab 05/22/13 0930  LIPASE 39   No results found for this basename: AMMONIA,  in the last 168 hours CBC:  Recent Labs Lab 05/22/13 0930  WBC 9.5  NEUTROABS 8.5*  HGB 12.9  HCT 37.4  MCV 82.9  PLT 303   Cardiac Enzymes: No results found for this basename: CKTOTAL, CKMB, CKMBINDEX, TROPONINI,  in the last 168 hours BNP: No components found with this basename: POCBNP,  CBG: No results found for this basename: GLUCAP,  in the last 168 hours  Radiological Exams on Admission: Ct Abdomen Pelvis W Contrast  05/22/2013   CLINICAL DATA:  Generalized abdominal pain  EXAM: CT ABDOMEN AND PELVIS WITH CONTRAST  TECHNIQUE: Multidetector CT  imaging of the abdomen and pelvis was performed using the standard protocol following bolus administration of intravenous contrast.  CONTRAST:  80mL OMNIPAQUE IOHEXOL 300 MG/ML  SOLN  COMPARISON:  None.  FINDINGS: There is a 5.6 mm left lower lobe pulmonary nodule (image 6/series 3).  There is a low-attenuation area in the left hepatic lobe adjacent to the falciform ligament measuring 3 x 3.3 cm with vessels coursing through this area of low-attenuation suggesting an area of focal fatty deposition. There is a 8.2 x 5.1 cm hypodense mass in the right hepatic lobe with peripheral nodular enhancement with progressive filling in on delayed phase imaging most consistent with a hemangioma. There is a smaller similar mass measuring 2 x 1.3 cm in the right hepatic lobe near the dome of the liver with peripheral nodular enhancement. There is no intrahepatic or extrahepatic biliary ductal dilatation. The gallbladder is normal. The spleen demonstrates no focal abnormality. There are nonobstructing left renal calculi with the largest measuring 9 mm. The right kidney, adrenal glands and pancreas are normal. The bladder is unremarkable.  The stomach, duodenum, small intestine, and large intestine demonstrate no contrast extravasation or dilatation. There is a large amount of stool throughout the colon. There is  a normal caliber appendix in the right lower quadrant without periappendiceal inflammatory changes.There is a relative area of bowel wall thickening with shouldering of the proximal and distal margins, involving the rectosigmoid junction with slight haziness along the right lateral aspect of the bowel wall. There is no pneumoperitoneum, pneumatosis, or portal venous gas. There is no abdominal or pelvic free fluid. There is no lymphadenopathy.  The abdominal aorta is normal in caliber with atherosclerosis.  There are no lytic or sclerotic osseous lesions.  IMPRESSION: 1. There is a relative area of bowel wall thickening  with shouldering of the proximal and distal margins, involving the rectosigmoid junction with slight haziness along the right lateral aspect of the bowel wall. The area is concerning for malignancy. Recommend further evaluation with colonoscopy.  There is a large amount of stool throughout the colon proximal to this area.  2. There are 2 hypodense right hepatic mass is with peripheral nodular enhancement most consistent with hemangiomas.  3.  Nonobstructing left renal calculi.  4. There is a low-attenuation area in the left hepatic lobe adjacent to the falciform ligament measuring 3 x 3.3 cm with vessels coursing through this area of low-attenuation suggesting an area of focal fatty deposition. If there is further clinical concern recommend an MRI of the abdomen without and with intravenous contrast.   Electronically Signed   By: Elige Ko   On: 05/22/2013 10:20      Assessment/Plan Principal Problem: Acute  Abdominal distention with rectosigmoid mass on CT scan Patient presents with reported history of abdominal distention with soreness and rectosigmoid mass seen on CT scan of the abdomen. Patient has not had a bowel movement for possible protease however is able to pass some gas. She has had several episodes of nausea and vomiting for possible days. CT scan of the abdomen and pelvis done yesterday also showed some low attenuation area in the left hepatic lobe. -Admit on observation. -Keep n.p.o. Good control with when necessary IV Toradol. Zofran when necessary for nausea and vomiting. -check CEA. repeat ca level in am. -IV hydration with normal saline. -[facility] ( Dr Marina Goodell) consulted by ED. Will see patient in the morning for colonoscopy. -Monitor serial abdominal exam  DVT prophylaxis: Sq Lovenox    Code Status: Full code Family Communication: Family at bedside Disposition Plan: Admit on observation   Eddie North [facility] Pager [phone]  If 7PM-7AM, please contact  night-coverage [email or web address] Password [email or web address]  05/23/2013, 8:00 PM    Total time spent admission: 55 minutes

## 2013-05-24 ENCOUNTER — Observation Stay (HOSPITAL_COMMUNITY): Payer: Medicaid Other

## 2013-05-24 DIAGNOSIS — K56609 Unspecified intestinal obstruction, unspecified as to partial versus complete obstruction: Secondary | ICD-10-CM | POA: Diagnosis present

## 2013-05-24 DIAGNOSIS — R141 Gas pain: Secondary | ICD-10-CM

## 2013-05-24 DIAGNOSIS — D490 Neoplasm of unspecified behavior of digestive system: Secondary | ICD-10-CM

## 2013-05-24 MED ORDER — GADOBENATE DIMEGLUMINE 529 MG/ML IV SOLN
15.0000 mL | Freq: Once | INTRAVENOUS | Status: AC | PRN
  Administered 2013-05-24: 14 mL via INTRAVENOUS

## 2013-05-24 NOTE — Consult Note (Signed)
Reason for Consult: Acute Rectosigmoid obstruction Referring Physician: Dr. Wendall Papa  [name] [name] is an 62 y.o. female.  HPI: This is a normally healthy woman who says she felt funny on; her stomach felt bad, not really nauseated, but didn't feel right all day, Monday (05/21/13) during the day.  She was able to eat during the day, and even had a BM during the day. Monday evening she started feeling much worse, with stomach distension, then nausea, vomiting and diffuse abdominal pain.  This kept her up all night and nothing seemed to relieve her symptoms.  Tuesday she felt a little better at first, but she had abdominal swelling that seemed to get worse, she had more pain, nausea, vomiting and bloating. By Tuesday evening her pain was worse and she finally went to the ER at [facility].  CT scan on 05/22/13.  Finding include a thickening of the bowel wall proximal and distal margins of the rectosigmoid junction with haziness along the right lateral aspect of the bowel wall concerning for malignancy.  There is also a 4.5 x 4 cm splenic mass, a 3 x 3.3 cm liver mass left lobe, suggestive of a metastatic cancer. Additional finding include a left renal calculi.  She also had a large amount of stool. She was sent home with Golytely and enemas, with recommendation for urgent colonoscopy.  She continued to have pain, nausea, vomiting and enemeas and meds sent home with her did not help.  She returned to the ER at [facility] 05/23/13 with ongoing pain, bloating, nausea, vomiting and not really able to eat. She has been seen by GI and they plan colonoscopy tomorrow and we are ask to see in consult. Labs OK, except for elevated CEA 225.  She has never had a colonoscopy.  MRI shows:  2.7 x 3.9 cm enhancing lesion in the medial segment left hepatic lobe, corresponding to the CT abnormality, suspicious for metastasis. Two additional benign hemangiomas, 3.7 x 4.4 cm enhancing lesion along the superior spleen, indeterminate  but worrisome for metastasis. Multiple dilated loops of colon, possibly reflecting some degree of colonic obstruction related to known distal colonic mass on CT.    History reviewed. No pertinent past medical history. PMH: NONE  Past Surgical History  Procedure Laterality Date  . Abdominal hysterectomy    . Cesarean section  1976   PMH:  Mother living at 5 and healthier than all her family.            Father is deceased, she doesn't know what happened.  2 Brothers in good health, 1 sister in good health  1 sister deceased with colon cancer at age 60.  Social History:  reports that she quit smoking about 42 years ago. She has never used smokeless tobacco. She reports that she does not drink alcohol or use illicit drugs. Tobacco:  1-2 years around high school Drugs: none Etoh:  None Married, children are grown, 1 granddaughter living with her now.  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 Allergies:  Allergies  Allergen Reactions  . Codeine Itching and Nausea And Vomiting    Medications:  Prior to Admission:  Prescriptions prior to admission  Medication Sig Dispense Refill  . CALCIUM-MAGNESIUM-ZINC PO Take 1 tablet by mouth daily. Fullmulsion      . CHLOROPHYLL PO Take 2 tablets by mouth daily.      . Cholecalciferol (VITAMIN D PO) Take 1 tablet by mouth daily.      . Cyanocobalamin (VITAMIN  B 12 PO) Take 1 tablet by mouth daily.      Marland Kitchen HYDROcodone-acetaminophen (NORCO/VICODIN) 5-325 MG per tablet Take 1-2 tablets by mouth every 4 (four) hours as needed.  10 tablet  0  . ondansetron (ZOFRAN) 4 MG tablet Take 1 tablet (4 mg total) by mouth every 6 (six) hours.  12 tablet  0  . sodium phosphate (FLEET) 7-19 GM/118ML ENEM Place 1 enema rectally once.  1 enema  1   Scheduled: . enoxaparin (LOVENOX) injection  40 mg Subcutaneous Q24H  . lidocaine   Other Once  . polyethylene glycol  17 g Oral Daily   Continuous: . sodium chloride 100 mL/hr at 05/24/13 0900   EAV:[id number],  acetaminophen, ketorolac, ondansetron (ZOFRAN) IV, ondansetron Anti-infectives   None      Results for orders placed during the hospital encounter of 05/23/13 (from the past 48 hour(s))  BASIC METABOLIC PANEL     Status: Abnormal   Collection Time    05/23/13  8:47 PM      Result Value Range   Sodium 138  135 - 145 mEq/L   Potassium 4.5  3.5 - 5.1 mEq/L   Chloride 104  96 - 112 mEq/L   CO2 24  19 - 32 mEq/L   Glucose, Bld 118 (*) 70 - 99 mg/dL   BUN 16  6 - 23 mg/dL   Creatinine, Ser 5.62  0.50 - 1.10 mg/dL   Calcium 13.0 (*) 8.4 - 10.5 mg/dL   GFR calc non Af Amer 78 (*) >90 mL/min   GFR calc Af Amer >90  >90 mL/min   Comment: (NOTE)     The eGFR has been calculated using the CKD EPI equation.     This calculation has not been validated in all clinical situations.     eGFR's persistently <90 mL/min signify possible Chronic Kidney     Disease.  CEA     Status: Abnormal   Collection Time    05/23/13  8:48 PM      Result Value Range   CEA 225.7 (*) 0.0 - 5.0 ng/mL   Comment: (NOTE)     Result repeated and verified.     Result confirmed by automatic dilution.     Performed at [facility]    Mr Abdomen W Wo Contrast  05/24/2013   CLINICAL DATA:  Abdominal pain, liver lesion on CT  EXAM: MRI ABDOMEN WITHOUT AND WITH CONTRAST  TECHNIQUE: Multiplanar multisequence MR imaging of the abdomen was performed both before and after the administration of intravenous contrast.  CONTRAST:  14mL MULTIHANCE GADOBENATE DIMEGLUMINE 529 MG/ML IV SOLN  COMPARISON:  CT abdomen pelvis dated 05/22/2013  FINDINGS: 5 mm pulmonary nodule in the left lower lobe (series 3/image 7).  2.7 x 3.9 cm lesion in the medial segment left hepatic lobe (series 1102/image 36), corresponding to the CT abnormality, demonstrates heterogeneous/peripheral enhancement, no intracellular lipid, and restricted diffusion (series 400/image 23). This appearance is suspicious for metastasis.  Two additional hepatic lesions  with characteristic peripheral nodular discontinuous enhancement of benign hemangiomas, as follows:  --7.7 x 5.0 cm lesion in the right hepatic lobe (series 1102/ image 44)  --1.9 x 2.4 cm lesion in the posterior right hepatic dome (series 1102/image 25)  Multiple additional tiny T2 hyperintense lesions likely reflect small cysts or hemangiomas (for example, series 3/images 15, 17, 19, 20, and 22), but are too small to characterize.  3.7 x 4.4 cm heterogeneously enhancing lesion along the superior spleen (  series 13/ image 37), indeterminate, but worrisome for metastasis.  Pancreas and adrenal glands are within normal limits.  Gallbladder is unremarkable. No intrahepatic or extrahepatic ductal dilatation.  Kidneys are within normal limits.  No hydronephrosis.  No abdominal ascites.  No suspicious abdominal lymphadenopathy.  Multiple dilated loops of colon, possibly reflecting some degree of colonic obstruction related to known distal colonic mass on CT.  Enhancing T2 hyperintense lesion in the right T3 vertebral body (series 3/ image 37), favored to reflect a benign vertebral hemangioma.  IMPRESSION: 2.7 x 3.9 cm enhancing lesion in the medial segment left hepatic lobe, corresponding to the CT abnormality, suspicious for metastasis.  Two additional benign hemangiomas, as described above. Multiple additional tiny probable hepatic cysts or hemangiomas, too small to characterize.  3.7 x 4.4 cm enhancing lesion along the superior spleen, indeterminate but worrisome for metastasis.  Multiple dilated loops of colon, possibly reflecting some degree of colonic obstruction related to known distal colonic mass on CT.   Electronically Signed   By: Charline Bills M.D.   On: 05/24/2013 08:43    Review of Systems  Constitutional: Positive for weight loss (about 10 pounds over a 2 month period, decreased appetite, but she didn't think much about it.). Negative for fever, chills, malaise/fatigue and diaphoresis.  HENT:  Negative.   Eyes: Negative.   Respiratory: Negative.   Cardiovascular: Negative.   Gastrointestinal: Positive for heartburn (some on 05/21/13.  ), nausea (Monday PM with distension, 05/21/13.), vomiting (Monday PM), abdominal pain (started with distension and then got worse. 05/21/13.) and blood in stool (occasional, but not enough to concern her.). Negative for diarrhea, constipation and melena.       She reports 3-4 BM's per day was her normal till Monday.  She did have a BM on Monday. 05/21/13.  Genitourinary: Negative.   Musculoskeletal: Negative.   Skin: Negative.   Neurological: Negative.  Negative for weakness.  Endo/Heme/Allergies: Negative.   Psychiatric/Behavioral: Negative.    Blood pressure 118/73, pulse 80, temperature 98.1 F (36.7 C), temperature source Oral, resp. rate 18, height 5\' 5"  (1.651 m), weight 68.04 kg (150 lb), SpO2 98.00%. Physical Exam  Constitutional: She is oriented to person, place, and time. She appears well-developed and well-nourished. No distress.  HENT:  Head: Normocephalic and atraumatic.  Nose: Nose normal.  Eyes: Conjunctivae and EOM are normal. Pupils are equal, round, and reactive to light. Right eye exhibits no discharge. Left eye exhibits no discharge. No scleral icterus.  Neck: Normal range of motion. Neck supple. No JVD present. No tracheal deviation present. No thyromegaly present.  Cardiovascular: Normal rate, regular rhythm, normal heart sounds and intact distal pulses.  Exam reveals no gallop.   No murmur heard. Respiratory: Effort normal and breath sounds normal. No respiratory distress. She has no wheezes. She has no rales. She exhibits no tenderness.  GI: Soft. Bowel sounds are normal. She exhibits distension. She exhibits no mass. There is tenderness. There is no rebound and no guarding.  Musculoskeletal: She exhibits no edema and no tenderness.  Lymphadenopathy:    She has no cervical adenopathy.  Neurological: She is alert and  oriented to person, place, and time. No cranial nerve deficit.  Skin: Skin is warm and dry. No rash (more sore than specific pain.  the pain is generalized over the entire abodomen.) noted. No erythema. No pallor.  Psychiatric: She has a normal mood and affect. Her behavior is normal. Judgment and thought content normal.    Assessment/Plan: 1.  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 2.  Weight loss  3.  Family hx of colon cancer  Plan:   She is for colonoscopy and possible stenting tomorrow.  If this is unsuccessful we will be available for surgical intervention,  possible diverting colostomy if needed.  [name] [name] 05/24/2013, 12:02 PM

## 2013-05-24 NOTE — Progress Notes (Signed)
INITIAL NUTRITION ASSESSMENT  DOCUMENTATION CODES Per approved criteria  -Not Applicable   INTERVENTION: - Diet advancement per MD - Will continue to monitor   NUTRITION DIAGNOSIS: Inadequate oral intake related to inability to eat as evidenced by NPO.   Goal: Advance diet as tolerated to regular diet  Monitor:  Weights, labs, diet advancement  Reason for Assessment: Nutrition risk   62 y.o. female  Admitting Dx: Rectal mass  ASSESSMENT: Admitted with generalized abdominal pain with nausea/vomiting since 11/17. Had CT scan that showed hypertrophy in rectosigmoid colon which was suspicious for malignancy. Pt reported intermittent blood in stool over the past several months per ED notes. Pt reported for the past month she had 3-4 episodes of loose BMs/day. Found to have abdominal distention. Reported 10 pound unintended weight loss in the past 6 weeks.   Met with pt who reports typically eating 3-4 meals/day but recently has been making herself eat due to poor appetite. Not on any nutritional supplements at home.   Nutrition Focused Physical Exam:  Subcutaneous Fat:  Orbital Region: WNL Upper Arm Region: severe wasting Thoracic and Lumbar Region: NA  Muscle:  Temple Region: WNL Clavicle Bone Region: mild/moderate wasting Clavicle and Acromion Bone Region: mild/moderate wasting Scapular Bone Region: NA Dorsal Hand: mild/moderate wasting Patellar Region: WNL Anterior Thigh Region: WNL Posterior Calf Region: WNL  Edema: None noted    Height: Ht Readings from Last 1 Encounters:  05/23/13 5\' 5"  (1.651 m)    Weight: Wt Readings from Last 1 Encounters:  05/23/13 150 lb (68.04 kg)    Ideal Body Weight: 125 lb   % Ideal Body Weight: 83%  Wt Readings from Last 10 Encounters:  05/23/13 150 lb (68.04 kg)    Usual Body Weight: 160 lb per pt  % Usual Body Weight: 94%  BMI:  Body mass index is 24.96 kg/(m^2).  Estimated Nutritional Needs: Kcal:  1400-1600 Protein: 60-70g Fluid: 1.4-1.6L/day  Skin: Intact   Diet Order: NPO  EDUCATION NEEDS: -No education needs identified at this time   Intake/Output Summary (Last 24 hours) at 05/24/13 1016 Last data filed at 05/24/13 0636  Gross per 24 hour  Intake   1010 ml  Output    850 ml  Net    160 ml    Last BM: 11/17  Labs:   Recent Labs Lab 05/22/13 0930 05/23/13 2047  NA 136 138  K 4.9 4.5  CL 101 104  CO2 23 24  BUN 17 16  CREATININE 0.77 0.80  CALCIUM 12.4* 12.0*  GLUCOSE 116* 118*    CBG (last 3)  No results found for this basename: GLUCAP,  in the last 72 hours  Scheduled Meds: . enoxaparin (LOVENOX) injection  40 mg Subcutaneous Q24H  . lidocaine   Other Once  . polyethylene glycol  17 g Oral Daily    Continuous Infusions: . sodium chloride 100 mL/hr at 05/24/13 0900    History reviewed. No pertinent past medical history.  Past Surgical History  Procedure Laterality Date  . Abdominal hysterectomy    . Cesarean section  [address] [place], [place], [place] [phone] Pager [phone] After Hours Pager

## 2013-05-24 NOTE — Progress Notes (Signed)
UR completed.   Patient changed to inpatient status r/t requiring IVF, IV antiemetics, and IV pain medication

## 2013-05-24 NOTE — Progress Notes (Signed)
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 Noted elevated CEA level. For colonoscopy with stent placement and biopsy tomorrow and if: Is fully obstructed, surgical resection colostomy. Pathology like back on Monday Active Problems:   Acute constipation: Secondary to above   Gaseous abdominal distention: Secondary to above   Nausea and vomiting in adult Hypercalcemia: Likely from dehydration. Check ionized calcium level if elevated and levels persist tomorrow, check phosphorus and PTH.  Code Status: Full code  Family Communication: Plan discussed with patient's husband and daughter at the bedside.  Disposition Plan: Here for at least a few days until pathology returned and plan set   Consultants:  [facility]  [place] [facility]  Procedures:  For colonoscopy with stent placement versus partial colonic resection tomorrow  Antibiotics:  None  HPI/Subjective: Patient feeling very weak. NG tube is irritating  Objective: Filed Vitals:   05/24/13 0540  BP: 122/72  Pulse: 85  Temp: 98.3 F (36.8 C)  Resp: 18    Intake/Output Summary (Last 24 hours) at 05/24/13 0937 Last data filed at 05/24/13 0636  Gross per 24 hour  Intake   1010 ml  Output    850 ml  Net    160 ml   Filed Weights   05/23/13 1717 05/23/13 2017  Weight: 68.04 kg (150 lb) 68.04 kg (150 lb)    Exam:   General:  Alert and oriented x3, fatigue, looks younger than stated age  Cardiovascular: Regular rate and rhythm, S1-S2  Respiratory: Clear to auscultation bilaterally  Abdomen: Soft, mild distention, nontender, positive bowel sounds  Musculoskeletal: No clubbing or cyanosis or edema   Data Reviewed: Basic Metabolic Panel:  Recent Labs Lab 05/22/13 0930  05/23/13 2047  NA 136 138  K 4.9 4.5  CL 101 104  CO2 23 24  GLUCOSE 116* 118*  BUN 17 16  CREATININE 0.77 0.80  CALCIUM 12.4* 12.0*   Liver Function Tests:  Recent Labs Lab 05/22/13 0930  AST 26  ALT 15  ALKPHOS 89  BILITOT 0.2*  PROT 9.1*  ALBUMIN 4.0    Recent Labs Lab 05/22/13 0930  LIPASE 39   No results found for this basename: AMMONIA,  in the last 168 hours CBC:  Recent Labs Lab 05/22/13 0930  WBC 9.5  NEUTROABS 8.5*  HGB 12.9  HCT 37.4  MCV 82.9  PLT 303   Cardiac Enzymes: No results found for this basename: CKTOTAL, CKMB, CKMBINDEX, TROPONINI,  in the last 168 hours BNP (last 3 results) No results found for this basename: PROBNP,  in the last 8760 hours CBG: No results found for this basename: GLUCAP,  in the last 168 hours  No results found for this or any previous visit (from the past 240 hour(s)).   Studies: Mr Abdomen W Wo Contrast  05/24/2013   CLINICAL DATA:  Abdominal pain, liver lesion on CT  EXAM: MRI ABDOMEN WITHOUT AND WITH CONTRAST  TECHNIQUE: Multiplanar multisequence MR imaging of the abdomen was performed both before and after the administration of intravenous contrast.  CONTRAST:  14mL MULTIHANCE GADOBENATE DIMEGLUMINE 529 MG/ML IV SOLN  COMPARISON:  CT abdomen pelvis dated 05/22/2013  FINDINGS: 5 mm pulmonary nodule in the left lower lobe (series 3/image 7).  2.7 x 3.9 cm lesion in the medial segment left hepatic lobe (series 1102/image  36), corresponding to the CT abnormality, demonstrates heterogeneous/peripheral enhancement, no intracellular lipid, and restricted diffusion (series 400/image 23). This appearance is suspicious for metastasis.  Two additional hepatic lesions with characteristic peripheral nodular discontinuous enhancement of benign hemangiomas, as follows:  --7.7 x 5.0 cm lesion in the right hepatic lobe (series 1102/ image 44)  --1.9 x 2.4 cm lesion in the posterior right hepatic dome (series 1102/image 25)  Multiple  additional tiny T2 hyperintense lesions likely reflect small cysts or hemangiomas (for example, series 3/images 15, 17, 19, 20, and 22), but are too small to characterize.  3.7 x 4.4 cm heterogeneously enhancing lesion along the superior spleen (series 13/ image 37), indeterminate, but worrisome for metastasis.  Pancreas and adrenal glands are within normal limits.  Gallbladder is unremarkable. No intrahepatic or extrahepatic ductal dilatation.  Kidneys are within normal limits.  No hydronephrosis.  No abdominal ascites.  No suspicious abdominal lymphadenopathy.  Multiple dilated loops of colon, possibly reflecting some degree of colonic obstruction related to known distal colonic mass on CT.  Enhancing T2 hyperintense lesion in the right T3 vertebral body (series 3/ image 37), favored to reflect a benign vertebral hemangioma.  IMPRESSION: 2.7 x 3.9 cm enhancing lesion in the medial segment left hepatic lobe, corresponding to the CT abnormality, suspicious for metastasis.  Two additional benign hemangiomas, as described above. Multiple additional tiny probable hepatic cysts or hemangiomas, too small to characterize.  3.7 x 4.4 cm enhancing lesion along the superior spleen, indeterminate but worrisome for metastasis.  Multiple dilated loops of colon, possibly reflecting some degree of colonic obstruction related to known distal colonic mass on CT.   Electronically Signed   By: Charline Bills M.D.   On: 05/24/2013 08:43   Ct Abdomen Pelvis W Contrast  05/24/2013   ADDENDUM REPORT: 05/24/2013 08:51  ADDENDUM: Addendum 05/24/2013 at 0849 hr  There is a 4.5 x 4 cm heterogeneously enhancing splenic mass which is well-circumscribed. When correlated with the MRI performed 05/24/2013 and the colonic findings, the appearance is concerning for metastatic disease.   Electronically Signed   By: Elige Ko   On: 05/24/2013 08:51   05/24/2013   CLINICAL DATA:  Generalized abdominal pain  EXAM: CT ABDOMEN AND PELVIS WITH  CONTRAST  TECHNIQUE: Multidetector CT imaging of the abdomen and pelvis was performed using the standard protocol following bolus administration of intravenous contrast.  CONTRAST:  80mL OMNIPAQUE IOHEXOL 300 MG/ML  SOLN  COMPARISON:  None.  FINDINGS: There is a 5.6 mm left lower lobe pulmonary nodule (image 6/series 3).  There is a low-attenuation area in the left hepatic lobe adjacent to the falciform ligament measuring 3 x 3.3 cm with vessels coursing through this area of low-attenuation suggesting an area of focal fatty deposition. There is a 8.2 x 5.1 cm hypodense mass in the right hepatic lobe with peripheral nodular enhancement with progressive filling in on delayed phase imaging most consistent with a hemangioma. There is a smaller similar mass measuring 2 x 1.3 cm in the right hepatic lobe near the dome of the liver with peripheral nodular enhancement. There is no intrahepatic or extrahepatic biliary ductal dilatation. The gallbladder is normal. The spleen demonstrates no focal abnormality. There are nonobstructing left renal calculi with the largest measuring 9 mm. The right kidney, adrenal glands and pancreas are normal. The bladder is unremarkable.  The stomach, duodenum, small intestine, and large intestine demonstrate no contrast extravasation or dilatation. There is a large amount of stool throughout the colon.  There is a normal caliber appendix in the right lower quadrant without periappendiceal inflammatory changes.There is a relative area of bowel wall thickening with shouldering of the proximal and distal margins, involving the rectosigmoid junction with slight haziness along the right lateral aspect of the bowel wall. There is no pneumoperitoneum, pneumatosis, or portal venous gas. There is no abdominal or pelvic free fluid. There is no lymphadenopathy.  The abdominal aorta is normal in caliber with atherosclerosis.  There are no lytic or sclerotic osseous lesions.  IMPRESSION: 1. There is a  relative area of bowel wall thickening with shouldering of the proximal and distal margins, involving the rectosigmoid junction with slight haziness along the right lateral aspect of the bowel wall. The area is concerning for malignancy. Recommend further evaluation with colonoscopy.  There is a large amount of stool throughout the colon proximal to this area.  2. There are 2 hypodense right hepatic mass is with peripheral nodular enhancement most consistent with hemangiomas.  3.  Nonobstructing left renal calculi.  4. There is a low-attenuation area in the left hepatic lobe adjacent to the falciform ligament measuring 3 x 3.3 cm with vessels coursing through this area of low-attenuation suggesting an area of focal fatty deposition. If there is further clinical concern recommend an MRI of the abdomen without and with intravenous contrast.  Electronically Signed: By: Elige Ko On: 05/22/2013 10:20    Scheduled Meds: . enoxaparin (LOVENOX) injection  40 mg Subcutaneous Q24H  . lidocaine   Other Once  . polyethylene glycol  17 g Oral Daily   Continuous Infusions: . sodium chloride 100 mL/hr at 05/24/13 0900    Principal Problem:   Rectal mass Active Problems:   Acute constipation   Gaseous abdominal distention   Nausea and vomiting in adult    Time spent: 15 minutes    Hollice Espy  [facility] Pager [phone]. If 7PM-7AM, please contact night-coverage at [email or web address], password [facility] 05/24/2013, 9:37 AM  LOS: 1 day

## 2013-05-24 NOTE — ED Provider Notes (Signed)
Medical screening examination/treatment/procedure(s) were performed by non-physician practitioner and as supervising physician I was immediately available for consultation/collaboration.  EKG Interpretation    Date/Time:    Ventricular Rate:    PR Interval:    QRS Duration:   QT Interval:    QTC Calculation:   R Axis:     Text Interpretation:               Shon Baton, MD 05/24/13 1239

## 2013-05-24 NOTE — Consult Note (Signed)
Referring Provider: No ref. provider found Primary Care Physician:  No PCP Per Patient Primary Gastroenterologist:  Gentry Fitz  Reason for Consultation:  Abnormal imaging suspicious for metastatic colon cancer  HPI: [name] [name] is a 62 y.o. female with no past medical history who presented to the ED on 11/18 with acute onset of abdominal distention with soreness and unable to have a bowel movement since Monday. This was also associated with several episodes of nausea and vomiting of food particles.  This was all sudden in onset.  Prior to this, for past one month she reports having loose bowel movements about 3-4 episodes every day.  Says that she has seen blood in her stool once or twice in the past. She was seen in the ED and had a CT scan of her abdomen and pelvis done which showed area of bowel wall thickening around the rectosigmoid junction concerning for malignancy along with a large amount of stool throughout the colon proximal to the lesion. Also showed a low attenuating lesion over left lobe of liver. Apparently on-call GI was asked for input and it was recommended giving patient a Fleet enema. This did not relieve her constipation. GI then recommended giving her GoLYTELY and sending her home with outpatient GI follow-up ASAP. Patient, however, did not have improvement in her symptoms and still had episodes of nausea and vomiting and persistent abdominal distention without any bowel movement. Says that she has not been able to pass much flatus since Monday.  She returned to the ED and it was recommended that she be admitted for further evaluation and management.  She reports a weight loss of about 10 pounds in the past 6-8 weeks, which was unintentional. Appetite was good up until Monday when her symptoms began.  She also reports that her sister was diagnosed of colon caner at age 50 and passed away the same year.  Patient herself has never undergone colonoscopy.   History reviewed. No  pertinent past medical history.  Past Surgical History  Procedure Laterality Date  . Abdominal hysterectomy    . Cesarean section  1976    Prior to Admission medications   Medication Sig Start Date End Date Taking? Authorizing Provider  CALCIUM-MAGNESIUM-ZINC PO Take 1 tablet by mouth daily. Fullmulsion   Yes Historical Provider, MD  CHLOROPHYLL PO Take 2 tablets by mouth daily.   Yes Historical Provider, MD  Cholecalciferol (VITAMIN D PO) Take 1 tablet by mouth daily.   Yes Historical Provider, MD  Cyanocobalamin (VITAMIN B 12 PO) Take 1 tablet by mouth daily.   Yes Historical Provider, MD  HYDROcodone-acetaminophen (NORCO/VICODIN) 5-325 MG per tablet Take 1-2 tablets by mouth every 4 (four) hours as needed. 05/22/13  Yes Tiffany Irine Seal, PA-C  ondansetron (ZOFRAN) 4 MG tablet Take 1 tablet (4 mg total) by mouth every 6 (six) hours. 05/22/13  Yes Tiffany Irine Seal, PA-C  sodium phosphate (FLEET) 7-19 GM/118ML ENEM Place 1 enema rectally once. 05/22/13  Yes Dorthula Matas, PA-C    Current Facility-Administered Medications  Medication Dose Route Frequency Provider Last Rate Last Dose  . 0.9 %  sodium chloride infusion   Intravenous Continuous Nishant Dhungel, MD 100 mL/hr at 05/24/13 0900    . acetaminophen (TYLENOL) tablet 650 mg  650 mg Oral Q6H PRN Nishant Dhungel, MD       Or  . acetaminophen (TYLENOL) suppository 650 mg  650 mg Rectal Q6H PRN Nishant Dhungel, MD      . enoxaparin (LOVENOX) injection  40 mg  40 mg Subcutaneous Q24H Nishant Dhungel, MD   40 mg at 05/23/13 2201  . ketorolac (TORADOL) 15 MG/ML injection 15 mg  15 mg Intravenous Q6H PRN Nishant Dhungel, MD   15 mg at 05/24/13 0413  . 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 ondansetron (ZOFRAN) tablet 4 mg  4 mg Oral Q6H PRN Nishant Dhungel, MD       Or  . ondansetron (ZOFRAN) injection 4 mg  4 mg Intravenous Q6H PRN Nishant Dhungel, MD      . polyethylene glycol (MIRALAX / GLYCOLAX)  packet 17 g  17 g Oral Daily Nicole Pisciotta, PA-C        Allergies as of 05/23/2013 - Review Complete 05/23/2013  Allergen Reaction Noted  . Codeine Itching and Nausea And Vomiting 05/22/2013    History reviewed. No pertinent family history.  History   Social History  . Marital Status: Married    Spouse Name: N/A    Number of Children: N/A  . Years of Education: N/A   Occupational History  . Not on file.   Social History Main Topics  . Smoking status: Former Smoker -- 1 years    Quit date: 05/24/1971  . Smokeless tobacco: Never Used  . Alcohol Use: No  . Drug Use: No  . Sexual Activity: Not on file   Other Topics Concern  . Not on file   Social History Narrative  . No narrative on file    Review of Systems: Ten point ROS is O/W negative except as mentioned in HPI.  Physical Exam: Vital signs in last 24 hours: Temp:  [98.3 F (36.8 C)-101 F (38.3 C)] 98.3 F (36.8 C) (11/20 0540) Pulse Rate:  [80-108] 85 (11/20 0540) Resp:  [16-20] 18 (11/20 0540) BP: (116-159)/(57-80) 122/72 mmHg (11/20 0540) SpO2:  [95 %-99 %] 97 % (11/20 0540) Weight:  [150 lb (68.04 kg)] 150 lb (68.04 kg) (11/19 2017) Last BM Date: 05/21/13 General:  Alert, Well-developed, well-nourished, pleasant and cooperative in NAD Head:  Normocephalic and atraumatic. Eyes:  Sclera clear, no icterus.  Conjunctiva pink. Ears:  Normal auditory acuity. Mouth:  No deformity or lesions.   Lungs:  Clear throughout to auscultation.  No wheezes, crackles, or rhonchi.  Heart:  Regular rate and rhythm; no murmurs, clicks, rubs,  or gallops. Abdomen:  Soft, but distended.  BS present but very quiet and hypoactive.     Rectal:  Deferred.  Msk:  Symmetrical without gross deformities. Pulses:  Normal pulses noted. Extremities:  Without clubbing or edema. Neurologic:  Alert and  oriented x4;  grossly normal neurologically. Skin:  Intact without significant lesions or rashes. Psych:  Alert and cooperative.  Normal mood and affect.  Intake/Output from previous day: 11/19 0701 - 11/20 0700 In: 1010 [I.V.:1010] Out: 850 [Urine:850]  Lab Results:  Recent Labs  05/22/13 0930  WBC 9.5  HGB 12.9  HCT 37.4  PLT 303   BMET  Recent Labs  05/22/13 0930 05/23/13 2047  NA 136 138  K 4.9 4.5  CL 101 104  CO2 23 24  GLUCOSE 116* 118*  BUN 17 16  CREATININE 0.77 0.80  CALCIUM 12.4* 12.0*   LFT  Recent Labs  05/22/13 0930  PROT 9.1*  ALBUMIN 4.0  AST 26  ALT 15  ALKPHOS 89  BILITOT 0.2*   Studies/Results: Mr Abdomen W Wo Contrast  05/24/2013   CLINICAL DATA:  Abdominal pain, liver lesion on CT  EXAM: MRI ABDOMEN WITHOUT AND WITH CONTRAST  TECHNIQUE: Multiplanar multisequence MR imaging of the abdomen was performed both before and after the administration of intravenous contrast.  CONTRAST:  14mL MULTIHANCE GADOBENATE DIMEGLUMINE 529 MG/ML IV SOLN  COMPARISON:  CT abdomen pelvis dated 05/22/2013  FINDINGS: 5 mm pulmonary nodule in the left lower lobe (series 3/image 7).  2.7 x 3.9 cm lesion in the medial segment left hepatic lobe (series 1102/image 36), corresponding to the CT abnormality, demonstrates heterogeneous/peripheral enhancement, no intracellular lipid, and restricted diffusion (series 400/image 23). This appearance is suspicious for metastasis.  Two additional hepatic lesions with characteristic peripheral nodular discontinuous enhancement of benign hemangiomas, as follows:  --7.7 x 5.0 cm lesion in the right hepatic lobe (series 1102/ image 44)  --1.9 x 2.4 cm lesion in the posterior right hepatic dome (series 1102/image 25)  Multiple additional tiny T2 hyperintense lesions likely reflect small cysts or hemangiomas (for example, series 3/images 15, 17, 19, 20, and 22), but are too small to characterize.  3.7 x 4.4 cm heterogeneously enhancing lesion along the superior spleen (series 13/ image 37), indeterminate, but worrisome for metastasis.  Pancreas and adrenal glands are  within normal limits.  Gallbladder is unremarkable. No intrahepatic or extrahepatic ductal dilatation.  Kidneys are within normal limits.  No hydronephrosis.  No abdominal ascites.  No suspicious abdominal lymphadenopathy.  Multiple dilated loops of colon, possibly reflecting some degree of colonic obstruction related to known distal colonic mass on CT.  Enhancing T2 hyperintense lesion in the right T3 vertebral body (series 3/ image 37), favored to reflect a benign vertebral hemangioma.  IMPRESSION: 2.7 x 3.9 cm enhancing lesion in the medial segment left hepatic lobe, corresponding to the CT abnormality, suspicious for metastasis.  Two additional benign hemangiomas, as described above. Multiple additional tiny probable hepatic cysts or hemangiomas, too small to characterize.  3.7 x 4.4 cm enhancing lesion along the superior spleen, indeterminate but worrisome for metastasis.  Multiple dilated loops of colon, possibly reflecting some degree of colonic obstruction related to known distal colonic mass on CT.   Electronically Signed   By: Charline Bills M.D.   On: 05/24/2013 08:43   Ct Abdomen Pelvis W Contrast  05/24/2013   ADDENDUM REPORT: 05/24/2013 08:51  ADDENDUM: Addendum 05/24/2013 at 0849 hr  There is a 4.5 x 4 cm heterogeneously enhancing splenic mass which is well-circumscribed. When correlated with the MRI performed 05/24/2013 and the colonic findings, the appearance is concerning for metastatic disease.   Electronically Signed   By: Elige Ko   On: 05/24/2013 08:51   05/24/2013   CLINICAL DATA:  Generalized abdominal pain  EXAM: CT ABDOMEN AND PELVIS WITH CONTRAST  TECHNIQUE: Multidetector CT imaging of the abdomen and pelvis was performed using the standard protocol following bolus administration of intravenous contrast.  CONTRAST:  80mL OMNIPAQUE IOHEXOL 300 MG/ML  SOLN  COMPARISON:  None.  FINDINGS: There is a 5.6 mm left lower lobe pulmonary nodule (image 6/series 3).  There is a  low-attenuation area in the left hepatic lobe adjacent to the falciform ligament measuring 3 x 3.3 cm with vessels coursing through this area of low-attenuation suggesting an area of focal fatty deposition. There is a 8.2 x 5.1 cm hypodense mass in the right hepatic lobe with peripheral nodular enhancement with progressive filling in on delayed phase imaging most consistent with a hemangioma. There is a smaller similar mass measuring 2 x 1.3 cm in the  right hepatic lobe near the dome of the liver with peripheral nodular enhancement. There is no intrahepatic or extrahepatic biliary ductal dilatation. The gallbladder is normal. The spleen demonstrates no focal abnormality. There are nonobstructing left renal calculi with the largest measuring 9 mm. The right kidney, adrenal glands and pancreas are normal. The bladder is unremarkable.  The stomach, duodenum, small intestine, and large intestine demonstrate no contrast extravasation or dilatation. There is a large amount of stool throughout the colon. There is a normal caliber appendix in the right lower quadrant without periappendiceal inflammatory changes.There is a relative area of bowel wall thickening with shouldering of the proximal and distal margins, involving the rectosigmoid junction with slight haziness along the right lateral aspect of the bowel wall. There is no pneumoperitoneum, pneumatosis, or portal venous gas. There is no abdominal or pelvic free fluid. There is no lymphadenopathy.  The abdominal aorta is normal in caliber with atherosclerosis.  There are no lytic or sclerotic osseous lesions.  IMPRESSION: 1. There is a relative area of bowel wall thickening with shouldering of the proximal and distal margins, involving the rectosigmoid junction with slight haziness along the right lateral aspect of the bowel wall. The area is concerning for malignancy. Recommend further evaluation with colonoscopy.  There is a large amount of stool throughout the colon  proximal to this area.  2. There are 2 hypodense right hepatic mass is with peripheral nodular enhancement most consistent with hemangiomas.  3.  Nonobstructing left renal calculi.  4. There is a low-attenuation area in the left hepatic lobe adjacent to the falciform ligament measuring 3 x 3.3 cm with vessels coursing through this area of low-attenuation suggesting an area of focal fatty deposition. If there is further clinical concern recommend an MRI of the abdomen without and with intravenous contrast.  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  Patient now presenting with obstructive-type symptoms.  PLAN: -Plan for un-prepped flex sig with colonic stent placement tomorrow, 11/21. -Surgery is going to see the patient as well. -NGT for decompression.   ZEHR, JESSICA D.  05/24/2013, 9:41 AM  Pager number [phone]  ________________________________________________________________________  [facility] MD note:  I personally examined the patient, reviewed the data and agree with the assessment and plan described above.  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  I discussed with Dr. Abbey Chatters and we agree that flex sig for tissue diagnosis and then stenting to relieve her obstruction is best plan.  Her husband and daughter understand risks (perforation, stent migration), benefits, alternative (diverting colonostomy), wish to proceed.   Rob Bunting, MD [facility] Pager [phone]

## 2013-05-24 NOTE — Consult Note (Signed)
Patient seen and examined.  Agree with Betha Loa note.

## 2013-05-24 NOTE — Care Management Note (Signed)
    Page 1 of 1   05/24/2013     4:21:56 PM   CARE MANAGEMENT NOTE 05/24/2013  Patient:  [name] [name], [name] [name]   Account Number:  [id number]  Date Initiated:  05/24/2013  Documentation initiated by:  Lorenda Ishihara  Subjective/Objective Assessment:   62 yo female admitted with abd pain, n/v, questionable colonic mass. PTA lived at home with spouse.     Action/Plan:   Home when stable   Anticipated DC Date:  05/27/2013   Anticipated DC Plan:  HOME/SELF CARE      DC Planning Services  CM consult      Choice offered to / List presented to:             Status of service:  Completed, signed off Medicare Important Message given?   (If response is "NO", the following Medicare IM given date fields will be blank) Date Medicare IM given:   Date Additional Medicare IM given:    Discharge Disposition:  HOME/SELF CARE  Per UR Regulation:  Reviewed for med. necessity/level of care/duration of stay  If discussed at Long Length of Stay Meetings, dates discussed:    Comments:

## 2013-05-25 ENCOUNTER — Encounter (HOSPITAL_COMMUNITY)
Admission: EM | Disposition: A | Discharge status: Home / Self Care | Payer: Self-pay | Source: Home / Self Care | Attending: Family Medicine

## 2013-05-25 ENCOUNTER — Encounter (HOSPITAL_COMMUNITY): Payer: Self-pay

## 2013-05-25 ENCOUNTER — Inpatient Hospital Stay (HOSPITAL_COMMUNITY): Payer: Medicaid Other

## 2013-05-25 DIAGNOSIS — C189 Malignant neoplasm of colon, unspecified: Principal | ICD-10-CM

## 2013-05-25 DIAGNOSIS — R112 Nausea with vomiting, unspecified: Secondary | ICD-10-CM

## 2013-05-25 DIAGNOSIS — C787 Secondary malignant neoplasm of liver and intrahepatic bile duct: Secondary | ICD-10-CM

## 2013-05-25 DIAGNOSIS — C785 Secondary malignant neoplasm of large intestine and rectum: Secondary | ICD-10-CM

## 2013-05-25 HISTORY — PX: COLONIC STENT PLACEMENT: SHX5542

## 2013-05-25 HISTORY — PX: FLEXIBLE SIGMOIDOSCOPY: SHX5431

## 2013-05-25 LAB — BASIC METABOLIC PANEL
BUN: 17 mg/dL (ref 6–23)
CO2: 24 mEq/L (ref 19–32)
Chloride: 105 mEq/L (ref 96–112)
GFR calc Af Amer: >90 mL/min (ref >90)
GFR calc non Af Amer: >90 mL/min (ref >90)
Glucose, Bld: 101 mg/dL — ABNORMAL HIGH (ref 70–99)
Potassium: 3.5 mEq/L (ref 3.5–5.1)
Sodium: 139 mEq/L (ref 135–145)

## 2013-05-25 LAB — CALCIUM, IONIZED: Calcium, Ion: 1.62 mmol/L — ABNORMAL HIGH (ref 1.13–1.30)

## 2013-05-25 SURGERY — SIGMOIDOSCOPY, FLEXIBLE
Anesthesia: Moderate Sedation

## 2013-05-25 MED ORDER — POLYETHYLENE GLYCOL 3350 17 G PO PACK
34.0000 g | PACK | Freq: Every day | ORAL | Status: DC
  Filled 2013-05-25 (×2): qty 2

## 2013-05-25 MED ORDER — FENTANYL CITRATE 0.05 MG/ML IJ SOLN
INTRAMUSCULAR | Status: AC
  Filled 2013-05-25: qty 2

## 2013-05-25 MED ORDER — FENTANYL CITRATE 0.05 MG/ML IJ SOLN
INTRAMUSCULAR | Status: DC | PRN
  Administered 2013-05-25 (×4): 25 ug via INTRAVENOUS

## 2013-05-25 MED ORDER — HYDROMORPHONE HCL PF 1 MG/ML IJ SOLN
1.0000 mg | Freq: Once | INTRAMUSCULAR | Status: AC
  Administered 2013-05-25: 1 mg via INTRAVENOUS
  Filled 2013-05-25: qty 1

## 2013-05-25 MED ORDER — HYDROMORPHONE HCL PF 1 MG/ML IJ SOLN
0.5000 mg | INTRAMUSCULAR | Status: DC | PRN
  Administered 2013-05-25 – 2013-05-27 (×8): 0.5 mg via INTRAVENOUS
  Filled 2013-05-25 (×9): qty 1

## 2013-05-25 MED ORDER — SODIUM CHLORIDE 0.9 % IV SOLN
INTRAVENOUS | Status: DC | PRN
  Administered 2013-05-25: 10:00:00

## 2013-05-25 MED ORDER — SODIUM CHLORIDE 0.9 % IV SOLN: INTRAVENOUS | Status: DC

## 2013-05-25 MED ORDER — MIDAZOLAM HCL 10 MG/2ML IJ SOLN
INTRAMUSCULAR | Status: DC | PRN
  Administered 2013-05-25: 1 mg via INTRAVENOUS
  Administered 2013-05-25 (×2): 2 mg via INTRAVENOUS

## 2013-05-25 MED ORDER — MIDAZOLAM HCL 10 MG/2ML IJ SOLN
INTRAMUSCULAR | Status: AC
  Filled 2013-05-25: qty 2

## 2013-05-25 MED ORDER — HYDROMORPHONE HCL PF 1 MG/ML IJ SOLN: 0.5000 mg | INTRAMUSCULAR | Status: DC | PRN

## 2013-05-25 NOTE — Op Note (Signed)
[facility] [address] [place], [address]   FLEXIBLE SIGMOIDOSCOPY PROCEDURE REPORT  PATIENT: [name] [name], [name] [name]  MR#: [id number] BIRTHDATE: 1950/10/29 , 62  yrs. old GENDER: Female ENDOSCOPIST: Rachael Fee, MD PROCEDURE DATE:  05/25/2013 PROCEDURE:   Sigmoidoscopy with biopsy and Sigmoidoscopy with stent  ASA CLASS:   Class III INDICATIONS:newly diagnosed rectosigmoid mass (presumed cancer) causing colonic obstruction, probable metastatic spread (liver, abdominal). MEDICATIONS: Fentanyl 100 mcg IV and Versed 5 mg IV  DESCRIPTION OF PROCEDURE:   After the risks benefits and alternatives of the procedure were thoroughly explained, informed consent was obtained.  revealed no abnormalities of the rectum. The EC-[id number] ([id number])  endoscope was introduced through the anus  and advanced to the sigmoid colon , limited by No adverse events experienced.   The quality of the prep was poor .  The instrument was then slowly withdrawn as the mucosa was fully examined.  COLON FINDINGS: There was a distal sigmoid, proximal rectum mass that was clearly malignant.  This mass is circumferential and completely obstructs the lumen.  I estimate the malignant stricture is 4-5cm long (distal end 10cm from anal verge.  The mass was first biopsied.  Then I placed a 9cm long, 22mm diameter uncovered SEMS across the mass in good position over a long biliary wire, using fluoroscopy and direct visualization with endoscopy.  The distal end of the stent lays 8-9cm from anal verge.  Following placement, there was gas and liquid stool passing through the stent. Retroflexion was not performed.    The scope was then withdrawn from the patient and the procedure terminated.  COMPLICATIONS: There were no complications.  ENDOSCOPIC IMPRESSION: 5-6cm obstructing rectosigmoid mass, distal end 10cm from anal verge.  The mass was biopsied and then stented using a 9cm long, 22mm diameter  uncovered SEMS.  RECOMMENDATIONS: Can d/c her NG tube.  She can begin clear liquids and advanced to low residue diet. It will be very important that her bowels are at least very soft.  Constipation will obstruct the stent.  She should be on 2 dosese of miralax daily, I will order this.  She will need medical oncology consultation.  Her case will be discussed at next week's multidisciplinary GI cancer conference.    _______________________________ eSigned:  Rachael Fee, MD 05/25/2013 10:29 AM   CC: Avel Peace, MD

## 2013-05-25 NOTE — Progress Notes (Signed)
Recommend getting an oncology consult today.

## 2013-05-25 NOTE — Progress Notes (Signed)
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 Noted elevated CEA level. Pt went for colonoscopy with stent placement and biopsy.  - Oncology consulted 05/25/13  Active Problems:   Acute constipation:  -Secondary to obstructive rectosigmoid mass.  S/p stent placement with improvement in condition.  Recommendations are for 2 doses of Miralax daily.   Gaseous abdominal distention: Secondary to above   Nausea and vomiting in adult - Resolved with stent placement - NG tube removed.  Hypercalcemia:  - Likely from dehydration.  - Will reassess next am - obtain pth related peptide, PTH, phosphurus,  Code Status: Full code  Family Communication: Plan discussed with patient and older sister. Disposition Plan: Consulted Oncology for further evaluation and recommendations. Pathology obtained 05/25/13  Consultants:  Dr. Christella Hartigan with GI  Dr. Abbey Chatters: Surgery  Oncology consulted 05/25/13  Procedures:  Stent placed at rectosigmoid  Antibiotics:  none  HPI/Subjective: Pt has no new complaints and currently feels better after stent placement.  Had discussion with patient on what the game plan is and answered her questions to her satisfaction.  Objective: Filed Vitals:   05/25/13 1355  BP: 144/80  Pulse: 97  Temp: 97.5 F (36.4 C)  Resp: 16    Intake/Output Summary (Last 24 hours) at 05/25/13 1440 Last data filed at 05/25/13 1100  Gross per 24 hour  Intake   1320 ml  Output   1400 ml  Net    -80 ml   Filed Weights   05/23/13 1717 05/23/13 2017  Weight: 68.04 kg (150 lb) 68.04 kg (150 lb)    Exam:   General:  Pt in NAD, Alert and Awake  Cardiovascular: RRR, no MRG  Respiratory: CTA BL, no wheezes  Abdomen: soft, NT, ND  Musculoskeletal: no  cyanosis or clubbing   Data Reviewed: Basic Metabolic Panel:  Recent Labs Lab 05/22/13 0930 05/23/13 2047 05/25/13 0433  NA 136 138 139  K 4.9 4.5 3.5  CL 101 104 105  CO2 23 24 24   GLUCOSE 116* 118* 101*  BUN 17 16 17   CREATININE 0.77 0.80 0.65  CALCIUM 12.4* 12.0* 11.0*   Liver Function Tests:  Recent Labs Lab 05/22/13 0930  AST 26  ALT 15  ALKPHOS 89  BILITOT 0.2*  PROT 9.1*  ALBUMIN 4.0    Recent Labs Lab 05/22/13 0930  LIPASE 39   No results found for this basename: AMMONIA,  in the last 168 hours CBC:  Recent Labs Lab 05/22/13 0930  WBC 9.5  NEUTROABS 8.5*  HGB 12.9  HCT 37.4  MCV 82.9  PLT 303   Cardiac Enzymes: No results found for this basename: CKTOTAL, CKMB, CKMBINDEX, TROPONINI,  in the last 168 hours BNP (last 3 results) No results found for this basename: PROBNP,  in the last 8760 hours CBG: No results found for this basename: GLUCAP,  in the last 168 hours  No results found for this or any previous visit (from the past 240 hour(s)).   Studies: Dg Pelvis 1-2 Views  05/25/2013   CLINICAL DATA:  Colon cancer.  Insertion of colonic stent.  EXAM: PELVIS - 1-2 VIEW  COMPARISON:  05/22/2013 CT.  Fluoroscopic time:  5 min and 30 seconds  FINDINGS: Four intraoperative C-arm views submitted for review after procedure. This reveals placement of a rectosigmoid stent with  narrowing of the mid aspect of the stent. Contrast was instilled. On limited imaging, no extravasation detected.  IMPRESSION: Placement of a rectosigmoid stent with narrowing of the mid aspect of the stent.   Electronically Signed   By: Bridgett Larsson M.D.   On: 05/25/2013 10:55   Mr Abdomen W Wo Contrast  05/24/2013   CLINICAL DATA:  Abdominal pain, liver lesion on CT  EXAM: MRI ABDOMEN WITHOUT AND WITH CONTRAST  TECHNIQUE: Multiplanar multisequence MR imaging of the abdomen was performed both before and after the administration of intravenous contrast.  CONTRAST:  14mL MULTIHANCE  GADOBENATE DIMEGLUMINE 529 MG/ML IV SOLN  COMPARISON:  CT abdomen pelvis dated 05/22/2013  FINDINGS: 5 mm pulmonary nodule in the left lower lobe (series 3/image 7).  2.7 x 3.9 cm lesion in the medial segment left hepatic lobe (series 1102/image 36), corresponding to the CT abnormality, demonstrates heterogeneous/peripheral enhancement, no intracellular lipid, and restricted diffusion (series 400/image 23). This appearance is suspicious for metastasis.  Two additional hepatic lesions with characteristic peripheral nodular discontinuous enhancement of benign hemangiomas, as follows:  --7.7 x 5.0 cm lesion in the right hepatic lobe (series 1102/ image 44)  --1.9 x 2.4 cm lesion in the posterior right hepatic dome (series 1102/image 25)  Multiple additional tiny T2 hyperintense lesions likely reflect small cysts or hemangiomas (for example, series 3/images 15, 17, 19, 20, and 22), but are too small to characterize.  3.7 x 4.4 cm heterogeneously enhancing lesion along the superior spleen (series 13/ image 37), indeterminate, but worrisome for metastasis.  Pancreas and adrenal glands are within normal limits.  Gallbladder is unremarkable. No intrahepatic or extrahepatic ductal dilatation.  Kidneys are within normal limits.  No hydronephrosis.  No abdominal ascites.  No suspicious abdominal lymphadenopathy.  Multiple dilated loops of colon, possibly reflecting some degree of colonic obstruction related to known distal colonic mass on CT.  Enhancing T2 hyperintense lesion in the right T3 vertebral body (series 3/ image 37), favored to reflect a benign vertebral hemangioma.  IMPRESSION: 2.7 x 3.9 cm enhancing lesion in the medial segment left hepatic lobe, corresponding to the CT abnormality, suspicious for metastasis.  Two additional benign hemangiomas, as described above. Multiple additional tiny probable hepatic cysts or hemangiomas, too small to characterize.  3.7 x 4.4 cm enhancing lesion along the superior spleen,  indeterminate but worrisome for metastasis.  Multiple dilated loops of colon, possibly reflecting some degree of colonic obstruction related to known distal colonic mass on CT.   Electronically Signed   By: Charline Bills M.D.   On: 05/24/2013 08:43    Scheduled Meds: . enoxaparin (LOVENOX) injection  40 mg Subcutaneous Q24H  . lidocaine   Other Once  . [START ON 05/26/2013] polyethylene glycol  34 g Oral Daily   Continuous Infusions: . sodium chloride 100 mL/hr at 05/25/13 4540    Principal Problem:   Rectal mass Active Problems:   Acute constipation   Gaseous abdominal distention   Nausea and vomiting in adult   Colon obstruction   Hypercalcemia    Time spent: > 35 minutes    [name] [name]  [facility] Pager [phone]. If 7PM-7AM, please contact night-coverage at [email or web address], password [email or web address] 05/25/2013, 2:40 PM  LOS: 2 days

## 2013-05-25 NOTE — Progress Notes (Signed)
Patient seen and examined.  Agree with PA's note.  

## 2013-05-25 NOTE — Progress Notes (Signed)
Subjective: She is tolerating NG and NPO, abdomen is still distended,  She is not complaining of anything.  Objective: Vital signs in last 24 hours: Temp:  [97.6 F (36.4 C)-99.4 F (37.4 C)] 97.6 F (36.4 C) (11/21 0526) Pulse Rate:  [80-92] 85 (11/21 0526) Resp:  [16-18] 16 (11/21 0526) BP: (118-159)/(73-85) 136/85 mmHg (11/21 0526) SpO2:  [96 %-98 %] 96 % (11/21 0526) Last BM Date: 05/21/13 Afebrile, VSS Labs OK, k+ is low normal 2600 urine output yesterday. Intake/Output from previous day: 11/20 0701 - 11/21 0700 In: 1940 [I.V.:1940] Out: 2750 [Urine:2600; Emesis/NG output:150] Intake/Output this shift:    General appearance: alert, cooperative and no distress GI: soft, but distended few Bs, she says she passed some flatus.  Lab Results:   Recent Labs  05/22/13 0930  WBC 9.5  HGB 12.9  HCT 37.4  PLT 303    BMET  Recent Labs  05/23/13 2047 05/25/13 0433  NA 138 139  K 4.5 3.5  CL 104 105  CO2 24 24  GLUCOSE 118* 101*  BUN 16 17  CREATININE 0.80 0.65  CALCIUM 12.0* 11.0*   PT/INR No results found for this basename: LABPROT, INR,  in the last 72 hours   Recent Labs Lab 05/22/13 0930  AST 26  ALT 15  ALKPHOS 89  BILITOT 0.2*  PROT 9.1*  ALBUMIN 4.0     Lipase     Component Value Date/Time   LIPASE 39 05/22/2013 0930     Studies/Results: Mr Abdomen W Wo Contrast  05/24/2013   CLINICAL DATA:  Abdominal pain, liver lesion on CT  EXAM: MRI ABDOMEN WITHOUT AND WITH CONTRAST  TECHNIQUE: Multiplanar multisequence MR imaging of the abdomen was performed both before and after the administration of intravenous contrast.  CONTRAST:  14mL MULTIHANCE GADOBENATE DIMEGLUMINE 529 MG/ML IV SOLN  COMPARISON:  CT abdomen pelvis dated 05/22/2013  FINDINGS: 5 mm pulmonary nodule in the left lower lobe (series 3/image 7).  2.7 x 3.9 cm lesion in the medial segment left hepatic lobe (series 1102/image 36), corresponding to the CT abnormality, demonstrates  heterogeneous/peripheral enhancement, no intracellular lipid, and restricted diffusion (series 400/image 23). This appearance is suspicious for metastasis.  Two additional hepatic lesions with characteristic peripheral nodular discontinuous enhancement of benign hemangiomas, as follows:  --7.7 x 5.0 cm lesion in the right hepatic lobe (series 1102/ image 44)  --1.9 x 2.4 cm lesion in the posterior right hepatic dome (series 1102/image 25)  Multiple additional tiny T2 hyperintense lesions likely reflect small cysts or hemangiomas (for example, series 3/images 15, 17, 19, 20, and 22), but are too small to characterize.  3.7 x 4.4 cm heterogeneously enhancing lesion along the superior spleen (series 13/ image 37), indeterminate, but worrisome for metastasis.  Pancreas and adrenal glands are within normal limits.  Gallbladder is unremarkable. No intrahepatic or extrahepatic ductal dilatation.  Kidneys are within normal limits.  No hydronephrosis.  No abdominal ascites.  No suspicious abdominal lymphadenopathy.  Multiple dilated loops of colon, possibly reflecting some degree of colonic obstruction related to known distal colonic mass on CT.  Enhancing T2 hyperintense lesion in the right T3 vertebral body (series 3/ image 37), favored to reflect a benign vertebral hemangioma.  IMPRESSION: 2.7 x 3.9 cm enhancing lesion in the medial segment left hepatic lobe, corresponding to the CT abnormality, suspicious for metastasis.  Two additional benign hemangiomas, as described above. Multiple additional tiny probable hepatic cysts or hemangiomas, too small to characterize.  3.7 x 4.4  cm enhancing lesion along the superior spleen, indeterminate but worrisome for metastasis.  Multiple dilated loops of colon, possibly reflecting some degree of colonic obstruction related to known distal colonic mass on CT.   Electronically Signed   By: Charline Bills M.D.   On: 05/24/2013 08:43    Medications: . enoxaparin (LOVENOX)  injection  40 mg Subcutaneous Q24H  . lidocaine   Other Once  . polyethylene glycol  17 g Oral Daily    Assessment/Plan 1. 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  2. Weight loss  3. Family hx of colon cancer   Plan:  For colonoscopy today and possible stent.   LOS: 2 days    [name] [name] 05/25/2013

## 2013-05-25 NOTE — H&P (View-Only) (Signed)
Referring Provider: No ref. provider found Primary Care Physician:  No PCP Per Patient Primary Gastroenterologist:  Unassigned  Reason for Consultation:  Abnormal imaging suspicious for metastatic colon cancer  HPI: [name] [name] is a 62 y.o. female with no past medical history who presented to the ED on 11/18 with acute onset of abdominal distention with soreness and unable to have a bowel movement since Monday. This was also associated with several episodes of nausea and vomiting of food particles.  This was all sudden in onset.  Prior to this, for past one month she reports having loose bowel movements about 3-4 episodes every day.  Says that she has seen blood in her stool once or twice in the past. She was seen in the ED and had a CT scan of her abdomen and pelvis done which showed area of bowel wall thickening around the rectosigmoid junction concerning for malignancy along with a large amount of stool throughout the colon proximal to the lesion. Also showed a low attenuating lesion over left lobe of liver. Apparently on-call GI was asked for input and it was recommended giving patient a Fleet enema. This did not relieve her constipation. GI then recommended giving her GoLYTELY and sending her home with outpatient GI follow-up ASAP. Patient, however, did not have improvement in her symptoms and still had episodes of nausea and vomiting and persistent abdominal distention without any bowel movement. Says that she has not been able to pass much flatus since Monday.  She returned to the ED and it was recommended that she be admitted for further evaluation and management.  She reports a weight loss of about 10 pounds in the past 6-8 weeks, which was unintentional. Appetite was good up until Monday when her symptoms began.  She also reports that her sister was diagnosed of colon caner at age 46 and passed away the same year.  Patient herself has never undergone colonoscopy.   History reviewed. No  pertinent past medical history.  Past Surgical History  Procedure Laterality Date  . Abdominal hysterectomy    . Cesarean section  1976    Prior to Admission medications   Medication Sig Start Date End Date Taking? Authorizing Provider  CALCIUM-MAGNESIUM-ZINC PO Take 1 tablet by mouth daily. Fullmulsion   Yes Historical Provider, MD  CHLOROPHYLL PO Take 2 tablets by mouth daily.   Yes Historical Provider, MD  Cholecalciferol (VITAMIN D PO) Take 1 tablet by mouth daily.   Yes Historical Provider, MD  Cyanocobalamin (VITAMIN B 12 PO) Take 1 tablet by mouth daily.   Yes Historical Provider, MD  HYDROcodone-acetaminophen (NORCO/VICODIN) 5-325 MG per tablet Take 1-2 tablets by mouth every 4 (four) hours as needed. 05/22/13  Yes Tiffany G Greene, PA-C  ondansetron (ZOFRAN) 4 MG tablet Take 1 tablet (4 mg total) by mouth every 6 (six) hours. 05/22/13  Yes Tiffany G Greene, PA-C  sodium phosphate (FLEET) 7-19 GM/118ML ENEM Place 1 enema rectally once. 05/22/13  Yes Tiffany G Greene, PA-C    Current Facility-Administered Medications  Medication Dose Route Frequency Provider Last Rate Last Dose  . 0.9 %  sodium chloride infusion   Intravenous Continuous Nishant Dhungel, MD 100 mL/hr at 05/24/13 0900    . acetaminophen (TYLENOL) tablet 650 mg  650 mg Oral Q6H PRN Nishant Dhungel, MD       Or  . acetaminophen (TYLENOL) suppository 650 mg  650 mg Rectal Q6H PRN Nishant Dhungel, MD      . enoxaparin (LOVENOX) injection   40 mg  40 mg Subcutaneous Q24H Nishant Dhungel, MD   40 mg at 05/23/13 2201  . ketorolac (TORADOL) 15 MG/ML injection 15 mg  15 mg Intravenous Q6H PRN Nishant Dhungel, MD   15 mg at 05/24/13 0413  . lidocaine (XYLOCAINE) 2 % jelly   Other Once Nicole Pisciotta, PA-C      . ondansetron (ZOFRAN) tablet 4 mg  4 mg Oral Q6H PRN Nishant Dhungel, MD       Or  . ondansetron (ZOFRAN) injection 4 mg  4 mg Intravenous Q6H PRN Nishant Dhungel, MD      . polyethylene glycol (MIRALAX / GLYCOLAX)  packet 17 g  17 g Oral Daily Nicole Pisciotta, PA-C        Allergies as of 05/23/2013 - Review Complete 05/23/2013  Allergen Reaction Noted  . Codeine Itching and Nausea And Vomiting 05/22/2013    History reviewed. No pertinent family history.  History   Social History  . Marital Status: Married    Spouse Name: N/A    Number of Children: N/A  . Years of Education: N/A   Occupational History  . Not on file.   Social History Main Topics  . Smoking status: Former Smoker -- 1 years    Quit date: 05/24/1971  . Smokeless tobacco: Never Used  . Alcohol Use: No  . Drug Use: No  . Sexual Activity: Not on file   Other Topics Concern  . Not on file   Social History Narrative  . No narrative on file    Review of Systems: Ten point ROS is O/W negative except as mentioned in HPI.  Physical Exam: Vital signs in last 24 hours: Temp:  [98.3 F (36.8 C)-101 F (38.3 C)] 98.3 F (36.8 C) (11/20 0540) Pulse Rate:  [80-108] 85 (11/20 0540) Resp:  [16-20] 18 (11/20 0540) BP: (116-159)/(57-80) 122/72 mmHg (11/20 0540) SpO2:  [95 %-99 %] 97 % (11/20 0540) Weight:  [150 lb (68.04 kg)] 150 lb (68.04 kg) (11/19 2017) Last BM Date: 05/21/13 General:  Alert, Well-developed, well-nourished, pleasant and cooperative in NAD Head:  Normocephalic and atraumatic. Eyes:  Sclera clear, no icterus.  Conjunctiva pink. Ears:  Normal auditory acuity. Mouth:  No deformity or lesions.   Lungs:  Clear throughout to auscultation.  No wheezes, crackles, or rhonchi.  Heart:  Regular rate and rhythm; no murmurs, clicks, rubs,  or gallops. Abdomen:  Soft, but distended.  BS present but very quiet and hypoactive.     Rectal:  Deferred.  Msk:  Symmetrical without gross deformities. Pulses:  Normal pulses noted. Extremities:  Without clubbing or edema. Neurologic:  Alert and  oriented x4;  grossly normal neurologically. Skin:  Intact without significant lesions or rashes. Psych:  Alert and cooperative.  Normal mood and affect.  Intake/Output from previous day: 11/19 0701 - 11/20 0700 In: 1010 [I.V.:1010] Out: 850 [Urine:850]  Lab Results:  Recent Labs  05/22/13 0930  WBC 9.5  HGB 12.9  HCT 37.4  PLT 303   BMET  Recent Labs  05/22/13 0930 05/23/13 2047  NA 136 138  K 4.9 4.5  CL 101 104  CO2 23 24  GLUCOSE 116* 118*  BUN 17 16  CREATININE 0.77 0.80  CALCIUM 12.4* 12.0*   LFT  Recent Labs  05/22/13 0930  PROT 9.1*  ALBUMIN 4.0  AST 26  ALT 15  ALKPHOS 89  BILITOT 0.2*   Studies/Results: Mr Abdomen W Wo Contrast  05/24/2013   CLINICAL DATA:    Abdominal pain, liver lesion on CT  EXAM: MRI ABDOMEN WITHOUT AND WITH CONTRAST  TECHNIQUE: Multiplanar multisequence MR imaging of the abdomen was performed both before and after the administration of intravenous contrast.  CONTRAST:  14mL MULTIHANCE GADOBENATE DIMEGLUMINE 529 MG/ML IV SOLN  COMPARISON:  CT abdomen pelvis dated 05/22/2013  FINDINGS: 5 mm pulmonary nodule in the left lower lobe (series 3/image 7).  2.7 x 3.9 cm lesion in the medial segment left hepatic lobe (series 1102/image 36), corresponding to the CT abnormality, demonstrates heterogeneous/peripheral enhancement, no intracellular lipid, and restricted diffusion (series 400/image 23). This appearance is suspicious for metastasis.  Two additional hepatic lesions with characteristic peripheral nodular discontinuous enhancement of benign hemangiomas, as follows:  --7.7 x 5.0 cm lesion in the right hepatic lobe (series 1102/ image 44)  --1.9 x 2.4 cm lesion in the posterior right hepatic dome (series 1102/image 25)  Multiple additional tiny T2 hyperintense lesions likely reflect small cysts or hemangiomas (for example, series 3/images 15, 17, 19, 20, and 22), but are too small to characterize.  3.7 x 4.4 cm heterogeneously enhancing lesion along the superior spleen (series 13/ image 37), indeterminate, but worrisome for metastasis.  Pancreas and adrenal glands are  within normal limits.  Gallbladder is unremarkable. No intrahepatic or extrahepatic ductal dilatation.  Kidneys are within normal limits.  No hydronephrosis.  No abdominal ascites.  No suspicious abdominal lymphadenopathy.  Multiple dilated loops of colon, possibly reflecting some degree of colonic obstruction related to known distal colonic mass on CT.  Enhancing T2 hyperintense lesion in the right T3 vertebral body (series 3/ image 37), favored to reflect a benign vertebral hemangioma.  IMPRESSION: 2.7 x 3.9 cm enhancing lesion in the medial segment left hepatic lobe, corresponding to the CT abnormality, suspicious for metastasis.  Two additional benign hemangiomas, as described above. Multiple additional tiny probable hepatic cysts or hemangiomas, too small to characterize.  3.7 x 4.4 cm enhancing lesion along the superior spleen, indeterminate but worrisome for metastasis.  Multiple dilated loops of colon, possibly reflecting some degree of colonic obstruction related to known distal colonic mass on CT.   Electronically Signed   By: Sriyesh  Krishnan M.D.   On: 05/24/2013 08:43   Ct Abdomen Pelvis W Contrast  05/24/2013   ADDENDUM REPORT: 05/24/2013 08:51  ADDENDUM: Addendum 05/24/2013 at 0849 hr  There is a 4.5 x 4 cm heterogeneously enhancing splenic mass which is well-circumscribed. When correlated with the MRI performed 05/24/2013 and the colonic findings, the appearance is concerning for metastatic disease.   Electronically Signed   By: Hetal  Patel   On: 05/24/2013 08:51   05/24/2013   CLINICAL DATA:  Generalized abdominal pain  EXAM: CT ABDOMEN AND PELVIS WITH CONTRAST  TECHNIQUE: Multidetector CT imaging of the abdomen and pelvis was performed using the standard protocol following bolus administration of intravenous contrast.  CONTRAST:  80mL OMNIPAQUE IOHEXOL 300 MG/ML  SOLN  COMPARISON:  None.  FINDINGS: There is a 5.6 mm left lower lobe pulmonary nodule (image 6/series 3).  There is a  low-attenuation area in the left hepatic lobe adjacent to the falciform ligament measuring 3 x 3.3 cm with vessels coursing through this area of low-attenuation suggesting an area of focal fatty deposition. There is a 8.2 x 5.1 cm hypodense mass in the right hepatic lobe with peripheral nodular enhancement with progressive filling in on delayed phase imaging most consistent with a hemangioma. There is a smaller similar mass measuring 2 x 1.3 cm in the   right hepatic lobe near the dome of the liver with peripheral nodular enhancement. There is no intrahepatic or extrahepatic biliary ductal dilatation. The gallbladder is normal. The spleen demonstrates no focal abnormality. There are nonobstructing left renal calculi with the largest measuring 9 mm. The right kidney, adrenal glands and pancreas are normal. The bladder is unremarkable.  The stomach, duodenum, small intestine, and large intestine demonstrate no contrast extravasation or dilatation. There is a large amount of stool throughout the colon. There is a normal caliber appendix in the right lower quadrant without periappendiceal inflammatory changes.There is a relative area of bowel wall thickening with shouldering of the proximal and distal margins, involving the rectosigmoid junction with slight haziness along the right lateral aspect of the bowel wall. There is no pneumoperitoneum, pneumatosis, or portal venous gas. There is no abdominal or pelvic free fluid. There is no lymphadenopathy.  The abdominal aorta is normal in caliber with atherosclerosis.  There are no lytic or sclerotic osseous lesions.  IMPRESSION: 1. There is a relative area of bowel wall thickening with shouldering of the proximal and distal margins, involving the rectosigmoid junction with slight haziness along the right lateral aspect of the bowel wall. The area is concerning for malignancy. Recommend further evaluation with colonoscopy.  There is a large amount of stool throughout the colon  proximal to this area.  2. There are 2 hypodense right hepatic mass is with peripheral nodular enhancement most consistent with hemangiomas.  3.  Nonobstructing left renal calculi.  4. There is a low-attenuation area in the left hepatic lobe adjacent to the falciform ligament measuring 3 x 3.3 cm with vessels coursing through this area of low-attenuation suggesting an area of focal fatty deposition. If there is further clinical concern recommend an MRI of the abdomen without and with intravenous contrast.  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  Patient now presenting with obstructive-type symptoms.  PLAN: -Plan for un-prepped flex sig with colonic stent placement tomorrow, 11/21. -Surgery is going to see the patient as well. -NGT for decompression.   ZEHR, JESSICA D.  05/24/2013, 9:41 AM  Pager number [phone]  ________________________________________________________________________  [facility] MD note:  I personally examined the patient, reviewed the data and agree with the assessment and plan described above.  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  I discussed with Dr. Rosenbower and we agree that flex sig for tissue diagnosis and then stenting to relieve her obstruction is best plan.  Her husband and daughter understand risks (perforation, stent migration), benefits, alternative (diverting colonostomy), wish to proceed.   Daniel Jacobs, MD [facility] Pager [phone]  

## 2013-05-25 NOTE — Consult Note (Signed)
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 female with no past medical history presents to the [facility] with abdominal distention and constipation for 2 days on 05/23/2013.   She also reported nausea and vomiting.   She denies any hematochezia or melana presently but reported intermittent blood in stool over the past several months.  She also reported 3 episodes of loose bowel movements per day.  She had an CT of her abdomen done revealing an area of bowel wall thickening around the rectosigmoid junction concerning for malignancy.  In addition, lesions were noted over the left lobe of the liver.  [facility] was consulted.  She had colonoscopy with stent placement and biopsy today.   In addition, she had a MRI of the abdomen confirming above the notable lesion in the spleen.  She was found to have an elevated CEA of 225.   Her history is notable for her sister with colon cancer who died at age 38.  She had not had a screening colonoscopy.  She reports a 10 lb weight lost over the past several months.  HPI  History reviewed. No pertinent past medical history.  Past Surgical History  Procedure Laterality Date  . Abdominal hysterectomy    . Cesarean section  1976    History reviewed. No pertinent family history.  Social History History  Substance Use Topics  . Smoking status: Former Smoker -- 1 years    Quit date: 05/24/1971  . Smokeless tobacco: Never Used  . Alcohol Use: No    Allergies  Allergen Reactions  . Codeine Itching and Nausea And Vomiting    Current Facility-Administered Medications  Medication Dose Route Frequency Provider Last Rate Last Dose  . 0.9 %  sodium chloride  infusion   Intravenous Continuous Nishant Dhungel, MD 100 mL/hr at 05/25/13 0724    . acetaminophen (TYLENOL) tablet 650 mg  650 mg Oral Q6H PRN Nishant Dhungel, MD       Or  . acetaminophen (TYLENOL) suppository 650 mg  650 mg Rectal Q6H PRN Nishant Dhungel, MD      . enoxaparin (LOVENOX) injection 40 mg  40 mg Subcutaneous Q24H Nishant Dhungel, MD   40 mg at 05/24/13 2200  . HYDROmorphone (DILAUDID) injection 0.5 mg  0.5 mg Intravenous Q3H PRN Roma Kayser Schorr, NP   0.5 mg at 05/25/13 1808  . ketorolac (TORADOL) 15 MG/ML injection 15 mg  15 mg Intravenous Q6H PRN Nishant Dhungel, MD   15 mg at 05/24/13 2314  . 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 ondansetron (ZOFRAN) tablet 4 mg  4 mg Oral Q6H PRN Nishant Dhungel, MD       Or  . ondansetron (ZOFRAN) injection 4 mg  4 mg Intravenous Q6H PRN Nishant Dhungel, MD      . Melene Muller ON 05/26/2013] polyethylene glycol (MIRALAX / GLYCOLAX) packet 34 g  34 g Oral Daily Rachael Fee, MD        Review of Systems  Constitutional: positive for weight loss, negative for chills, fevers and sweats Eyes: negative for visual disturbance Ears, nose, mouth, throat, and face: negative Respiratory: negative Cardiovascular: negative Gastrointestinal: positive for  change in bowel habits, constipation, diarrhea, nausea and vomiting, negative for jaundice Genitourinary:negative Hematologic/lymphatic: negative Musculoskeletal:negative Neurological: negative Behavioral/Psych: negative  Physical Exam  YNW:GNFAO, well nourished and well developed SKIN: skin color, texture, turgor are normal HEAD: Normocephalic, No masses, lesions, tenderness or abnormalities EYES: PERRLA, EOMI, sclera clear OROPHARYNX:no exudate, no erythema and dentition normal  NECK: supple, thyroid normal size, non-tender, without nodularity LUNGS: negative findings:  normal respiratory rate and rhythm, clear to auscultation  LYMPHADENOPATHY: No  cervical lymphadenopathy.  HEART: regular rate & rhythm, no murmurs and no gallops ABDOMEN:abdomen soft, normal bowel sounds and positive distention and no rebound or guarding EXTREMITIES:no edema, no clubbing  NEURO: alert & oriented x 3 with fluent speech  PERFORMANCE STATUS: ECOG 1/2  LABORATORY DATA: Lab Results  Component Value Date   WBC 9.5 05/22/2013   HGB 12.9 05/22/2013   HCT 37.4 05/22/2013   MCV 82.9 05/22/2013   PLT 303 05/22/2013   RADIOGRAPHIC STUDIES: Dg Pelvis 1-2 Views  05/25/2013   CLINICAL DATA:  Colon cancer.  Insertion of colonic stent.  EXAM: PELVIS - 1-2 VIEW  COMPARISON:  05/22/2013 CT.  Fluoroscopic time:  5 min and 30 seconds  FINDINGS: Four intraoperative C-arm views submitted for review after procedure. This reveals placement of a rectosigmoid stent with narrowing of the mid aspect of the stent. Contrast was instilled. On limited imaging, no extravasation detected.  IMPRESSION: Placement of a rectosigmoid stent with narrowing of the mid aspect of the stent.   Electronically Signed   By: Bridgett Larsson M.D.   On: 05/25/2013 10:55   Mr Abdomen W Wo Contrast  05/24/2013   CLINICAL DATA:  Abdominal pain, liver lesion on CT  EXAM: MRI ABDOMEN WITHOUT AND WITH CONTRAST  TECHNIQUE: Multiplanar multisequence MR imaging of the abdomen was performed both before and after the administration of intravenous contrast.  CONTRAST:  14mL MULTIHANCE GADOBENATE DIMEGLUMINE 529 MG/ML IV SOLN  COMPARISON:  CT abdomen pelvis dated 05/22/2013  FINDINGS: 5 mm pulmonary nodule in the left lower lobe (series 3/image 7).  2.7 x 3.9 cm lesion in the medial segment left hepatic lobe (series 1102/image 36), corresponding to the CT abnormality, demonstrates heterogeneous/peripheral enhancement, no intracellular lipid, and restricted diffusion (series 400/image 23). This appearance is suspicious for metastasis.  Two additional hepatic lesions with characteristic peripheral nodular discontinuous  enhancement of benign hemangiomas, as follows:  --7.7 x 5.0 cm lesion in the right hepatic lobe (series 1102/ image 44)  --1.9 x 2.4 cm lesion in the posterior right hepatic dome (series 1102/image 25)  Multiple additional tiny T2 hyperintense lesions likely reflect small cysts or hemangiomas (for example, series 3/images 15, 17, 19, 20, and 22), but are too small to characterize.  3.7 x 4.4 cm heterogeneously enhancing lesion along the superior spleen (series 13/ image 37), indeterminate, but worrisome for metastasis.  Pancreas and adrenal glands are within normal limits.  Gallbladder is unremarkable. No intrahepatic or extrahepatic ductal dilatation.  Kidneys are within normal limits.  No hydronephrosis.  No abdominal ascites.  No suspicious abdominal lymphadenopathy.  Multiple dilated loops of colon, possibly reflecting some degree of colonic obstruction related to known distal colonic mass on CT.  Enhancing T2 hyperintense lesion in the right T3 vertebral body (series 3/ image 37), favored to reflect a benign vertebral hemangioma.  IMPRESSION: 2.7 x 3.9 cm enhancing lesion in the medial segment left hepatic lobe, corresponding to the CT abnormality, suspicious for metastasis.  Two additional benign hemangiomas, as described above. Multiple  additional tiny probable hepatic cysts or hemangiomas, too small to characterize.  3.7 x 4.4 cm enhancing lesion along the superior spleen, indeterminate but worrisome for metastasis.  Multiple dilated loops of colon, possibly reflecting some degree of colonic obstruction related to known distal colonic mass on CT.   Electronically Signed   By: Charline Bills M.D.   On: 05/24/2013 08:43   Ct Abdomen Pelvis W Contrast  05/24/2013   ADDENDUM REPORT: 05/24/2013 08:51  ADDENDUM: Addendum 05/24/2013 at 0849 hr  There is a 4.5 x 4 cm heterogeneously enhancing splenic mass which is well-circumscribed. When correlated with the MRI performed 05/24/2013 and the colonic findings,  the appearance is concerning for metastatic disease.   Electronically Signed   By: Elige Ko   On: 05/24/2013 08:51   05/24/2013   CLINICAL DATA:  Generalized abdominal pain  EXAM: CT ABDOMEN AND PELVIS WITH CONTRAST  TECHNIQUE: Multidetector CT imaging of the abdomen and pelvis was performed using the standard protocol following bolus administration of intravenous contrast.  CONTRAST:  80mL OMNIPAQUE IOHEXOL 300 MG/ML  SOLN  COMPARISON:  None.  FINDINGS: There is a 5.6 mm left lower lobe pulmonary nodule (image 6/series 3).  There is a low-attenuation area in the left hepatic lobe adjacent to the falciform ligament measuring 3 x 3.3 cm with vessels coursing through this area of low-attenuation suggesting an area of focal fatty deposition. There is a 8.2 x 5.1 cm hypodense mass in the right hepatic lobe with peripheral nodular enhancement with progressive filling in on delayed phase imaging most consistent with a hemangioma. There is a smaller similar mass measuring 2 x 1.3 cm in the right hepatic lobe near the dome of the liver with peripheral nodular enhancement. There is no intrahepatic or extrahepatic biliary ductal dilatation. The gallbladder is normal. The spleen demonstrates no focal abnormality. There are nonobstructing left renal calculi with the largest measuring 9 mm. The right kidney, adrenal glands and pancreas are normal. The bladder is unremarkable.  The stomach, duodenum, small intestine, and large intestine demonstrate no contrast extravasation or dilatation. There is a large amount of stool throughout the colon. There is a normal caliber appendix in the right lower quadrant without periappendiceal inflammatory changes.There is a relative area of bowel wall thickening with shouldering of the proximal and distal margins, involving the rectosigmoid junction with slight haziness along the right lateral aspect of the bowel wall. There is no pneumoperitoneum, pneumatosis, or portal venous gas. There  is no abdominal or pelvic free fluid. There is no lymphadenopathy.  The abdominal aorta is normal in caliber with atherosclerosis.  There are no lytic or sclerotic osseous lesions.  IMPRESSION: 1. There is a relative area of bowel wall thickening with shouldering of the proximal and distal margins, involving the rectosigmoid junction with slight haziness along the right lateral aspect of the bowel wall. The area is concerning for malignancy. Recommend further evaluation with colonoscopy.  There is a large amount of stool throughout the colon proximal to this area.  2. There are 2 hypodense right hepatic mass is with peripheral nodular enhancement most consistent with hemangiomas.  3.  Nonobstructing left renal calculi.  4. There is a low-attenuation area in the left hepatic lobe adjacent to the falciform ligament measuring 3 x 3.3 cm with vessels coursing through this area of low-attenuation suggesting an area of focal fatty deposition. If there is further clinical concern recommend an MRI of the abdomen without and with intravenous contrast.  Electronically Signed: By:  Elige Ko On: 05/22/2013 10:20   Dg C-arm 61-120 Min-no Report  05/25/2013   CLINICAL DATA: bowel obstruction   C-ARM 61-120 MINUTES  Fluoroscopy was utilized by the requesting physician.  No radiographic  interpretation.     ASSESSMENT: 62 yo AAF with family history of colon cancer, admitted with obstructive mass s/p stent and biopsy on 11/21 concerning for metastatic colon cancer with mets to liver, spleen.  ECOG 1. CEA of 225.  PLAN:  1. 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  -- Await final pathology. Please send pathology for KRAS, BRAF and NRAS testing.   --We will discuss treatment for non-operable metastatic colon cancer more in depth when pathology results made available on Monday.  Combinations to consider would be based on mutational status and functional status, i.e., FOLFOX.  She can follow-up  as an outpatient.  Consideration for liver biopsy to confirm metastatic liver disease. Given pulmonary nodule in the lower lobe, might consider CT of chest to complete staging.    --Based on her age and family history, referral for genetic testing for Lynch syndrome, i.e. MSI or IHC, would be appropriate.   --The patient voices understanding of current disease status and chemotherapy as a potential treatment options and is in agreement with the current care plan.  All questions were answered. The patient knows to call the clinic with any problems, questions or concerns. We can certainly see the patient much sooner if necessary.  Thank you so much for allowing me to participate in the care of [name] [name]. I will continue to follow up the patient with you and assist in her care.  I spent 30 minutes counseling the patient face to face. The total time spent in the appointment was 45 minutes.  Rodolfo Gaster 05/25/2013, 6:38 PM

## 2013-05-25 NOTE — Interval H&P Note (Signed)
History and Physical Interval Note:  05/25/2013 8:37 AM  [name] [name]  has presented today for surgery, with the diagnosis of Rectosigmoid mass causing colonic obstruction  The various methods of treatment have been discussed with the patient and family. After consideration of risks, benefits and other options for treatment, the patient has consented to  Procedure(s) with comments: FLEXIBLE SIGMOIDOSCOPY (N/A) - needs floro COLONIC STENT PLACEMENT (N/A) as a surgical intervention .  The patient's history has been reviewed, patient examined, no change in status, stable for surgery.  I have reviewed the patient's chart and labs.  Questions were answered to the patient's satisfaction.     Rachael Fee

## 2013-05-26 DIAGNOSIS — R109 Unspecified abdominal pain: Secondary | ICD-10-CM

## 2013-05-26 DIAGNOSIS — K56609 Unspecified intestinal obstruction, unspecified as to partial versus complete obstruction: Secondary | ICD-10-CM

## 2013-05-26 DIAGNOSIS — R198 Other specified symptoms and signs involving the digestive system and abdomen: Secondary | ICD-10-CM

## 2013-05-26 LAB — BASIC METABOLIC PANEL
BUN: 16 mg/dL (ref 6–23)
Calcium: 10.8 mg/dL — ABNORMAL HIGH (ref 8.4–10.5)
Chloride: 102 mEq/L (ref 96–112)
Creatinine, Ser: 0.61 mg/dL (ref 0.50–1.10)
GFR calc Af Amer: >90 mL/min (ref >90)
GFR calc non Af Amer: >90 mL/min (ref >90)

## 2013-05-26 LAB — VITAMIN D 25 HYDROXY (VIT D DEFICIENCY, FRACTURES): Vit D, 25-Hydroxy: 19 ng/mL — ABNORMAL LOW (ref 30–89)

## 2013-05-26 LAB — PHOSPHORUS: Phosphorus: 1.4 mg/dL — ABNORMAL LOW (ref 2.3–4.6)

## 2013-05-26 NOTE — Progress Notes (Signed)
1 Day Post-Op  Subjective: Having multiple BMs.  Feels significantly better.  Objective: Vital signs in last 24 hours: Temp:  [97.5 F (36.4 C)-98.5 F (36.9 C)] 98.3 F (36.8 C) (11/22 0630) Pulse Rate:  [67-155] 67 (11/22 0630) Resp:  [12-23] 18 (11/22 0630) BP: (131-175)/(74-124) 144/82 mmHg (11/22 0630) SpO2:  [92 %-100 %] 97 % (11/22 0630) Last BM Date: 05/21/13  Intake/Output from previous day: 11/21 0701 - 11/22 0700 In: 3271.7 [P.O.:480; I.V.:2791.7] Out: -  Intake/Output this shift:    PE: General- In NAD Abdomen-softer and less distended, non-tender  Lab Results:  No results found for this basename: WBC, HGB, HCT, PLT,  in the last 72 hours BMET  Recent Labs  05/25/13 0433 05/26/13 0521  NA 139 136  K 3.5 3.3*  CL 105 102  CO2 24 25  GLUCOSE 101* 108*  BUN 17 16  CREATININE 0.65 0.61  CALCIUM 11.0* 10.8*   PT/INR No results found for this basename: LABPROT, INR,  in the last 72 hours Comprehensive Metabolic Panel:    Component Value Date/Time   NA 136 05/26/2013 0521   K 3.3* 05/26/2013 0521   CL 102 05/26/2013 0521   CO2 25 05/26/2013 0521   BUN 16 05/26/2013 0521   CREATININE 0.61 05/26/2013 0521   GLUCOSE 108* 05/26/2013 0521   CALCIUM 10.8* 05/26/2013 0521   AST 26 05/22/2013 0930   ALT 15 05/22/2013 0930   ALKPHOS 89 05/22/2013 0930   BILITOT 0.2* 05/22/2013 0930   PROT 9.1* 05/22/2013 0930   ALBUMIN 4.0 05/22/2013 0930     Studies/Results: Dg Pelvis 1-2 Views  05/25/2013   CLINICAL DATA:  Colon cancer.  Insertion of colonic stent.  EXAM: PELVIS - 1-2 VIEW  COMPARISON:  05/22/2013 CT.  Fluoroscopic time:  5 min and 30 seconds  FINDINGS: Four intraoperative C-arm views submitted for review after procedure. This reveals placement of a rectosigmoid stent with narrowing of the mid aspect of the stent. Contrast was instilled. On limited imaging, no extravasation detected.  IMPRESSION: Placement of a rectosigmoid stent with narrowing of  the mid aspect of the stent.   Electronically Signed   By: Bridgett Larsson M.D.   On: 05/25/2013 10:55   Dg C-arm 61-120 Min-no Report  05/25/2013   CLINICAL DATA: bowel obstruction   C-ARM 61-120 MINUTES  Fluoroscopy was utilized by the requesting physician.  No radiographic  interpretation.     Anti-infectives: Anti-infectives   None      Assessment Large bowel obstruction secondary to probable rectosigmoid cancer with metastases-significant clinical improvement since stent placed.   LOS: 3 days   Plan: Await pathology results and final oncology recs.  Will be available if needed.   Kikue Gerhart J 05/26/2013

## 2013-05-26 NOTE — Progress Notes (Signed)
[facility] PROGRESS NOTE  [name] [name] UUV:[id number] DOB: [birth year] DOA: 05/23/2013 PCP: No PCP Per Patient  Assessment/Plan: Principal Problem:  Rectal mass: -  Most likely metastatic adenocarcinoma of the colon with metastases to the liver. Noted elevated CEA level. Pt went for colonoscopy with stent placement and biopsy. Currently feeling much better and passing BMs   - Oncology consulted 05/25/13  Active Problems:   Acute constipation:  -Secondary to obstructive rectosigmoid mass.  S/p stent placement with improvement in condition.  Recommendations are for 2 doses of Miralax daily.   Gaseous abdominal distention: Secondary to above   Nausea and vomiting in adult - Resolved with stent placement - NG tube removed.  Hypercalcemia:  - Likely from dehydration.  - Will reassess next am - obtain pth related peptide, PTH, phosphurus: full results pending although phosphorus is low and calcium remains high  Code Status: Full code  Family Communication: Plan discussed with patient and older sister. Disposition Plan: Consulted Oncology for further evaluation and recommendations. Pathology obtained 05/25/13  Consultants:  Dr. Christella Hartigan with GI  Dr. Abbey Chatters: Surgery  Oncology consulted 05/25/13  Procedures:  Stent placed at rectosigmoid  Antibiotics:  none  HPI/Subjective: No new complaints. No acute issues overnight  Objective: Filed Vitals:   05/26/13 0630  BP: 144/82  Pulse: 67  Temp: 98.3 F (36.8 C)  Resp: 18    Intake/Output Summary (Last 24 hours) at 05/26/13 1112 Last data filed at 05/26/13 1100  Gross per 24 hour  Intake 3511.67 ml  Output      0 ml  Net 3511.67 ml   Filed Weights   05/23/13 1717 05/23/13 2017  Weight: 68.04 kg (150 lb) 68.04 kg (150 lb)    Exam:   General:  Pt in NAD, Alert and Awake  Cardiovascular: RRR, no MRG  Respiratory: CTA BL, no wheezes  Abdomen: soft, NT, ND  Musculoskeletal: no cyanosis or  clubbing   Data Reviewed: Basic Metabolic Panel:  Recent Labs Lab 05/22/13 0930 05/23/13 2047 05/25/13 0433 05/26/13 0521  NA 136 138 139 136  K 4.9 4.5 3.5 3.3*  CL 101 104 105 102  CO2 23 24 24 25   GLUCOSE 116* 118* 101* 108*  BUN 17 16 17 16   CREATININE 0.77 0.80 0.65 0.61  CALCIUM 12.4* 12.0* 11.0* 10.8*  PHOS  --   --   --  1.4*   Liver Function Tests:  Recent Labs Lab 05/22/13 0930  AST 26  ALT 15  ALKPHOS 89  BILITOT 0.2*  PROT 9.1*  ALBUMIN 4.0    Recent Labs Lab 05/22/13 0930  LIPASE 39   No results found for this basename: AMMONIA,  in the last 168 hours CBC:  Recent Labs Lab 05/22/13 0930  WBC 9.5  NEUTROABS 8.5*  HGB 12.9  HCT 37.4  MCV 82.9  PLT 303   Cardiac Enzymes: No results found for this basename: CKTOTAL, CKMB, CKMBINDEX, TROPONINI,  in the last 168 hours BNP (last 3 results) No results found for this basename: PROBNP,  in the last 8760 hours CBG: No results found for this basename: GLUCAP,  in the last 168 hours  No results found for this or any previous visit (from the past 240 hour(s)).   Studies: Dg Pelvis 1-2 Views  05/25/2013   CLINICAL DATA:  Colon cancer.  Insertion of colonic stent.  EXAM: PELVIS - 1-2 VIEW  COMPARISON:  05/22/2013 CT.  Fluoroscopic time:  5 min and 30 seconds  FINDINGS: Four  intraoperative C-arm views submitted for review after procedure. This reveals placement of a rectosigmoid stent with narrowing of the mid aspect of the stent. Contrast was instilled. On limited imaging, no extravasation detected.  IMPRESSION: Placement of a rectosigmoid stent with narrowing of the mid aspect of the stent.   Electronically Signed   By: Bridgett Larsson M.D.   On: 05/25/2013 10:55   Dg C-arm 61-120 Min-no Report  05/25/2013   CLINICAL DATA: bowel obstruction   C-ARM 61-120 MINUTES  Fluoroscopy was utilized by the requesting physician.  No radiographic  interpretation.     Scheduled Meds: . enoxaparin (LOVENOX)  injection  40 mg Subcutaneous Q24H  . lidocaine   Other Once  . polyethylene glycol  34 g Oral Daily   Continuous Infusions: . sodium chloride 100 mL/hr at 05/26/13 1103    Principal Problem:   Rectal mass Active Problems:   Acute constipation   Gaseous abdominal distention   Nausea and vomiting in adult   Colon obstruction   Hypercalcemia    Time spent: > 35 minutes    [name] [name]  [facility] Pager [phone]. If 7PM-7AM, please contact night-coverage at [email or web address], password [facility] 05/26/2013, 11:12 AM  LOS: 3 days

## 2013-05-26 NOTE — Progress Notes (Signed)
[facility] Progress Note    Since last GI note: FLex sig with biopsy of mass and stent placement across mass to relieve the obstruction.  She is feeling much better today, + flatus, + BMs, tolerating liquids, abd distension gone  Objective: Vital signs in last 24 hours: Temp:  [97.5 F (36.4 C)-98.3 F (36.8 C)] 98.3 F (36.8 C) (11/22 0630) Pulse Rate:  [67-97] 67 (11/22 0630) Resp:  [16-18] 18 (11/22 0630) BP: (131-144)/(75-82) 144/82 mmHg (11/22 0630) SpO2:  [96 %-99 %] 97 % (11/22 0630) Last BM Date: 05/26/13 General: alert and oriented times 3 Heart: regular rate and rythm Abdomen: soft, non-tender, non-distended, normal bowel sounds   Lab Results: No results found for this basename: WBC, HGB, PLT, MCV,  in the last 72 hours  Recent Labs  05/23/13 2047 05/25/13 0433 05/26/13 0521  NA 138 139 136  K 4.5 3.5 3.3*  CL 104 105 102  CO2 24 24 25   GLUCOSE 118* 101* 108*  BUN 16 17 16   CREATININE 0.80 0.65 0.61  CALCIUM 12.0* 11.0* 10.8*   Medications: Scheduled Meds: . enoxaparin (LOVENOX) injection  40 mg Subcutaneous Q24H  . lidocaine   Other Once  . polyethylene glycol  34 g Oral Daily   Continuous Infusions: . sodium chloride 100 mL/hr at 05/26/13 1103   PRN Meds:.acetaminophen, acetaminophen, HYDROmorphone (DILAUDID) injection, ketorolac, ondansetron (ZOFRAN) IV, ondansetron    Assessment/Plan: 62 y.o. 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  Oncology consultation pending.  From GI standpoint she is safe to d/c home.  She can slowly advance to low residue diet but must always take 2 doses of miralax once daily to keep her bowels moving, stent patent.  Please call or page with any further questions or concerns.   Rachael Fee, MD  05/26/2013, 12:24 PM [facility] Pager [phone]

## 2013-05-27 MED ORDER — HYDROCODONE-ACETAMINOPHEN 5-325 MG PO TABS: 1.0000 | ORAL_TABLET | Freq: Four times a day (QID) | ORAL | Status: DC | PRN

## 2013-05-27 MED ORDER — ONDANSETRON 4 MG PO TBDP: 4.0000 mg | ORAL_TABLET | Freq: Three times a day (TID) | ORAL | Status: DC | PRN

## 2013-05-27 MED ORDER — POTASSIUM CHLORIDE CRYS ER 20 MEQ PO TBCR
40.0000 meq | EXTENDED_RELEASE_TABLET | Freq: Once | ORAL | Status: AC
  Administered 2013-05-27: 40 meq via ORAL
  Filled 2013-05-27: qty 2

## 2013-05-27 MED ORDER — POLYETHYLENE GLYCOL 3350 17 G PO PACK: 34.0000 g | PACK | Freq: Two times a day (BID) | ORAL | Status: DC

## 2013-05-27 NOTE — Discharge Summary (Signed)
Physician Discharge Summary  [name] [name] ZOX:[id number] DOB: [birth year] DOA: 05/23/2013  PCP: No PCP Per Patient  Admit date: 05/23/2013 Discharge date: 05/27/2013  Time spent: > 35 minutes  Recommendations for Outpatient Follow-up:  1. Please be sure to follow up with pending pathology reports 2. Follow up with potassium levels 3. Continue to ensure that patient is taking MiraLax daily as indicated below 4. Pain control 5. Follow up with workup for hypercalcemia. Parathyroid hormone pending and parathyroid hormone-related peptide pending as well  Discharge Diagnoses:  Principal Problem:   Rectal mass Active Problems:   Acute constipation   Gaseous abdominal distention   Nausea and vomiting in adult   Colon obstruction   Hypercalcemia   Discharge Condition: Stable  Diet recommendation: Low residue diet as recommended by GI specialist  Filed Weights   05/23/13 1717 05/23/13 2017  Weight: 68.04 kg (150 lb) 68.04 kg (150 lb)    History of present illness:  62 year old African American female with no past medical history who presented to the ED complaining of abdominal distention and constipation for 2 days CT scan of her abdomen and pelvis showed area of bowel wall thickening at the rectosigmoid junction concerning for malignancy. GI and general surgery consulted.  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 Noted elevated CEA level. Pt went for colonoscopy with stent placement and biopsy of mass at rectosigmoid junction. Currently feeling much better and passing BMs after stent placement. - Oncology consulted 05/25/13 and plans are for followup for her pathology results of biopsy of mass at rectosigmoid junction - Patient evaluated by GI and they recommended the following: Colonic obstruction relieved with stent placement. Oncology consultation pending. From GI standpoint she is  safe to d/c home. She can slowly advance to low residue diet but must always take 2 doses of miralax once daily to keep her bowels moving, stent patent.  Active Problems:  Acute constipation:  -Secondary to obstructive rectosigmoid mass. S/p stent placement with improvement in condition. Recommendations are for 2 doses of Miralax daily.   Gaseous abdominal distention: Secondary to above   Nausea and vomiting in adult  - Resolved after stent placement  Hypercalcemia:  - Likely from dehydration.  - Will reassess next am  - obtain pth related peptide, PTH, phosphurus: full results pending although phosphorus is low and calcium remains high. We'll recommend that primary care physician or oncologist followup with hypercalcemia workup. Phosphorus level oh and PTH as well as PTH related peptide results are pending   Procedures:  Colonoscopy by Dr. Christella Hartigan  Consultations:  GI  General surgery  Oncology  Discharge Exam: Filed Vitals:   05/27/13 1305  BP: 146/81  Pulse: 86  Temp: 97.6 F (36.4 C)  Resp: 16    General: Patient in no acute distress, alert and awake Cardiovascular: Regular rate and rhythm, no murmurs Respiratory: Clear to auscultation, no increased work of breathing, no wheezes  Discharge Instructions  Discharge Orders   Future Appointments Provider Department Dept Phone   06/19/2013 1:30 PM Rachael Fee, MD [facility] [phone]   Future Orders Complete By Expires   Diet - low sodium heart healthy  As directed    Discharge instructions  As directed    Comments:     Patient to follow up with Oncologist Dr. Myra Rude within the next week. If she has not heard back from oncology she is to  call the oncologist within the next day or 2 for appointment.   Increase activity slowly  As directed        Medication List    STOP taking these medications       CHLOROPHYLL PO     ondansetron 4 MG tablet  Commonly known as:  ZOFRAN      sodium phosphate 7-19 GM/118ML Enem      TAKE these medications       CALCIUM-MAGNESIUM-ZINC PO  Take 1 tablet by mouth daily. Fullmulsion     HYDROcodone-acetaminophen 5-325 MG per tablet  Commonly known as:  NORCO  Take 1 tablet by mouth every 6 (six) hours as needed for moderate pain.     ondansetron 4 MG disintegrating tablet  Commonly known as:  ZOFRAN ODT  Take 1 tablet (4 mg total) by mouth every 8 (eight) hours as needed for nausea or vomiting.     polyethylene glycol packet  Commonly known as:  MIRALAX / GLYCOLAX  Take 34 g by mouth 2 (two) times daily.     VITAMIN B 12 PO  Take 1 tablet by mouth daily.     VITAMIN D PO  Take 1 tablet by mouth daily.       Allergies  Allergen Reactions  . Codeine Itching and Nausea And Vomiting      The results of significant diagnostics from this hospitalization (including imaging, microbiology, ancillary and laboratory) are listed below for reference.    Significant Diagnostic Studies: Dg Pelvis 1-2 Views  05/25/2013   CLINICAL DATA:  Colon cancer.  Insertion of colonic stent.  EXAM: PELVIS - 1-2 VIEW  COMPARISON:  05/22/2013 CT.  Fluoroscopic time:  5 min and 30 seconds  FINDINGS: Four intraoperative C-arm views submitted for review after procedure. This reveals placement of a rectosigmoid stent with narrowing of the mid aspect of the stent. Contrast was instilled. On limited imaging, no extravasation detected.  IMPRESSION: Placement of a rectosigmoid stent with narrowing of the mid aspect of the stent.   Electronically Signed   By: Bridgett Larsson M.D.   On: 05/25/2013 10:55   Mr Abdomen W Wo Contrast  05/24/2013   CLINICAL DATA:  Abdominal pain, liver lesion on CT  EXAM: MRI ABDOMEN WITHOUT AND WITH CONTRAST  TECHNIQUE: Multiplanar multisequence MR imaging of the abdomen was performed both before and after the administration of intravenous contrast.  CONTRAST:  14mL MULTIHANCE GADOBENATE DIMEGLUMINE 529 MG/ML IV SOLN   COMPARISON:  CT abdomen pelvis dated 05/22/2013  FINDINGS: 5 mm pulmonary nodule in the left lower lobe (series 3/image 7).  2.7 x 3.9 cm lesion in the medial segment left hepatic lobe (series 1102/image 36), corresponding to the CT abnormality, demonstrates heterogeneous/peripheral enhancement, no intracellular lipid, and restricted diffusion (series 400/image 23). This appearance is suspicious for metastasis.  Two additional hepatic lesions with characteristic peripheral nodular discontinuous enhancement of benign hemangiomas, as follows:  --7.7 x 5.0 cm lesion in the right hepatic lobe (series 1102/ image 44)  --1.9 x 2.4 cm lesion in the posterior right hepatic dome (series 1102/image 25)  Multiple additional tiny T2 hyperintense lesions likely reflect small cysts or hemangiomas (for example, series 3/images 15, 17, 19, 20, and 22), but are too small to characterize.  3.7 x 4.4 cm heterogeneously enhancing lesion along the superior spleen (series 13/ image 37), indeterminate, but worrisome for metastasis.  Pancreas and adrenal glands are within normal limits.  Gallbladder is unremarkable. No intrahepatic or extrahepatic ductal dilatation.  Kidneys are within normal limits.  No hydronephrosis.  No abdominal ascites.  No suspicious abdominal lymphadenopathy.  Multiple dilated loops of colon, possibly reflecting some degree of colonic obstruction related to known distal colonic mass on CT.  Enhancing T2 hyperintense lesion in the right T3 vertebral body (series 3/ image 37), favored to reflect a benign vertebral hemangioma.  IMPRESSION: 2.7 x 3.9 cm enhancing lesion in the medial segment left hepatic lobe, corresponding to the CT abnormality, suspicious for metastasis.  Two additional benign hemangiomas, as described above. Multiple additional tiny probable hepatic cysts or hemangiomas, too small to characterize.  3.7 x 4.4 cm enhancing lesion along the superior spleen, indeterminate but worrisome for metastasis.   Multiple dilated loops of colon, possibly reflecting some degree of colonic obstruction related to known distal colonic mass on CT.   Electronically Signed   By: Charline Bills M.D.   On: 05/24/2013 08:43   Ct Abdomen Pelvis W Contrast  05/24/2013   ADDENDUM REPORT: 05/24/2013 08:51  ADDENDUM: Addendum 05/24/2013 at 0849 hr  There is a 4.5 x 4 cm heterogeneously enhancing splenic mass which is well-circumscribed. When correlated with the MRI performed 05/24/2013 and the colonic findings, the appearance is concerning for metastatic disease.   Electronically Signed   By: Elige Ko   On: 05/24/2013 08:51   05/24/2013   CLINICAL DATA:  Generalized abdominal pain  EXAM: CT ABDOMEN AND PELVIS WITH CONTRAST  TECHNIQUE: Multidetector CT imaging of the abdomen and pelvis was performed using the standard protocol following bolus administration of intravenous contrast.  CONTRAST:  80mL OMNIPAQUE IOHEXOL 300 MG/ML  SOLN  COMPARISON:  None.  FINDINGS: There is a 5.6 mm left lower lobe pulmonary nodule (image 6/series 3).  There is a low-attenuation area in the left hepatic lobe adjacent to the falciform ligament measuring 3 x 3.3 cm with vessels coursing through this area of low-attenuation suggesting an area of focal fatty deposition. There is a 8.2 x 5.1 cm hypodense mass in the right hepatic lobe with peripheral nodular enhancement with progressive filling in on delayed phase imaging most consistent with a hemangioma. There is a smaller similar mass measuring 2 x 1.3 cm in the right hepatic lobe near the dome of the liver with peripheral nodular enhancement. There is no intrahepatic or extrahepatic biliary ductal dilatation. The gallbladder is normal. The spleen demonstrates no focal abnormality. There are nonobstructing left renal calculi with the largest measuring 9 mm. The right kidney, adrenal glands and pancreas are normal. The bladder is unremarkable.  The stomach, duodenum, small intestine, and large  intestine demonstrate no contrast extravasation or dilatation. There is a large amount of stool throughout the colon. There is a normal caliber appendix in the right lower quadrant without periappendiceal inflammatory changes.There is a relative area of bowel wall thickening with shouldering of the proximal and distal margins, involving the rectosigmoid junction with slight haziness along the right lateral aspect of the bowel wall. There is no pneumoperitoneum, pneumatosis, or portal venous gas. There is no abdominal or pelvic free fluid. There is no lymphadenopathy.  The abdominal aorta is normal in caliber with atherosclerosis.  There are no lytic or sclerotic osseous lesions.  IMPRESSION: 1. There is a relative area of bowel wall thickening with shouldering of the proximal and distal margins, involving the rectosigmoid junction with slight haziness along the right lateral aspect of the bowel wall. The area is concerning for malignancy. Recommend further evaluation with colonoscopy.  There is a large amount  of stool throughout the colon proximal to this area.  2. There are 2 hypodense right hepatic mass is with peripheral nodular enhancement most consistent with hemangiomas.  3.  Nonobstructing left renal calculi.  4. There is a low-attenuation area in the left hepatic lobe adjacent to the falciform ligament measuring 3 x 3.3 cm with vessels coursing through this area of low-attenuation suggesting an area of focal fatty deposition. If there is further clinical concern recommend an MRI of the abdomen without and with intravenous contrast.  Electronically Signed: By: Elige Ko On: 05/22/2013 10:20   Dg C-arm 61-120 Min-no Report  05/25/2013   CLINICAL DATA: bowel obstruction   C-ARM 61-120 MINUTES  Fluoroscopy was utilized by the requesting physician.  No radiographic  interpretation.     Microbiology: No results found for this or any previous visit (from the past 240 hour(s)).   Labs: Basic Metabolic  Panel:  Recent Labs Lab 05/22/13 0930 05/23/13 2047 05/25/13 0433 05/26/13 0521  NA 136 138 139 136  K 4.9 4.5 3.5 3.3*  CL 101 104 105 102  CO2 23 24 24 25   GLUCOSE 116* 118* 101* 108*  BUN 17 16 17 16   CREATININE 0.77 0.80 0.65 0.61  CALCIUM 12.4* 12.0* 11.0* 10.8*  PHOS  --   --   --  1.4*   Liver Function Tests:  Recent Labs Lab 05/22/13 0930  AST 26  ALT 15  ALKPHOS 89  BILITOT 0.2*  PROT 9.1*  ALBUMIN 4.0    Recent Labs Lab 05/22/13 0930  LIPASE 39   No results found for this basename: AMMONIA,  in the last 168 hours CBC:  Recent Labs Lab 05/22/13 0930  WBC 9.5  NEUTROABS 8.5*  HGB 12.9  HCT 37.4  MCV 82.9  PLT 303   Cardiac Enzymes: No results found for this basename: CKTOTAL, CKMB, CKMBINDEX, TROPONINI,  in the last 168 hours BNP: BNP (last 3 results) No results found for this basename: PROBNP,  in the last 8760 hours CBG: No results found for this basename: GLUCAP,  in the last 168 hours     Signed:  Penny Pia  [facility] 05/27/2013, 2:06 PM

## 2013-05-27 NOTE — Progress Notes (Signed)
Pt did not receive her am dose of Miralax today or yesterday due to pt having numerous loose stools.  MD aware.

## 2013-05-28 ENCOUNTER — Telehealth: Payer: Self-pay

## 2013-05-28 ENCOUNTER — Encounter (HOSPITAL_COMMUNITY): Payer: Self-pay | Admitting: Gastroenterology

## 2013-05-28 LAB — PARATHYROID HORMONE, INTACT (NO CA): PTH: 192.5 pg/mL — ABNORMAL HIGH (ref 14.0–72.0)

## 2013-05-28 NOTE — ED Provider Notes (Signed)
Medical screening examination/treatment/procedure(s) were performed by non-physician practitioner and as supervising physician I was immediately available for consultation/collaboration.  EKG Interpretation   None        Juliet Rude. Rubin Payor, MD 05/28/13 1551

## 2013-05-28 NOTE — Telephone Encounter (Signed)
LEFT PT VM TO RETURN CALL IN REF TO HOSP. F/U APPT °

## 2013-05-28 NOTE — Telephone Encounter (Signed)
LEFT PT VM TO RETURN CALL IN REF TO HOSP. F/U APPT

## 2013-05-29 ENCOUNTER — Telehealth: Payer: Self-pay

## 2013-05-29 NOTE — Telephone Encounter (Signed)
S/W PT IN REF TO HOSP. F/U ON 06/04/13@11 :00 NO NEED TO SEND NP PACKET

## 2013-06-04 ENCOUNTER — Other Ambulatory Visit (HOSPITAL_BASED_OUTPATIENT_CLINIC_OR_DEPARTMENT_OTHER): Payer: Medicaid Other | Admitting: Lab

## 2013-06-04 ENCOUNTER — Encounter: Payer: Self-pay | Admitting: Internal Medicine

## 2013-06-04 ENCOUNTER — Ambulatory Visit (HOSPITAL_BASED_OUTPATIENT_CLINIC_OR_DEPARTMENT_OTHER): Payer: Medicaid Other | Admitting: Internal Medicine

## 2013-06-04 ENCOUNTER — Ambulatory Visit: Payer: Medicaid Other

## 2013-06-04 ENCOUNTER — Other Ambulatory Visit: Payer: Self-pay | Admitting: Internal Medicine

## 2013-06-04 ENCOUNTER — Telehealth: Payer: Self-pay | Admitting: Internal Medicine

## 2013-06-04 VITALS — BP 128/66 | HR 118 | Temp 98.1°F | Resp 18 | Ht 65.0 in | Wt 146.7 lb

## 2013-06-04 DIAGNOSIS — C189 Malignant neoplasm of colon, unspecified: Secondary | ICD-10-CM

## 2013-06-04 DIAGNOSIS — R000 Tachycardia, unspecified: Secondary | ICD-10-CM

## 2013-06-04 DIAGNOSIS — C19 Malignant neoplasm of rectosigmoid junction: Secondary | ICD-10-CM

## 2013-06-04 DIAGNOSIS — Z800 Family history of malignant neoplasm of digestive organs: Secondary | ICD-10-CM

## 2013-06-04 DIAGNOSIS — C787 Secondary malignant neoplasm of liver and intrahepatic bile duct: Secondary | ICD-10-CM

## 2013-06-04 DIAGNOSIS — B370 Candidal stomatitis: Secondary | ICD-10-CM

## 2013-06-04 DIAGNOSIS — C801 Malignant (primary) neoplasm, unspecified: Secondary | ICD-10-CM

## 2013-06-04 DIAGNOSIS — E559 Vitamin D deficiency, unspecified: Secondary | ICD-10-CM

## 2013-06-04 LAB — COMPREHENSIVE METABOLIC PANEL (CC13)
Albumin: 2.6 g/dL — ABNORMAL LOW (ref 3.5–5.0)
Alkaline Phosphatase: 75 U/L (ref 40–150)
Anion Gap: 10 mEq/L (ref 3–11)
BUN: 8.9 mg/dL (ref 7.0–26.0)
CO2: 30 mEq/L — ABNORMAL HIGH (ref 22–29)
Calcium: 12.7 mg/dL — ABNORMAL HIGH (ref 8.4–10.4)
Chloride: 98 mEq/L (ref 98–109)
Creatinine: 0.8 mg/dL (ref 0.6–1.1)
Glucose: 112 mg/dl (ref 70–140)
Potassium: 3.6 mEq/L (ref 3.5–5.1)
Total Bilirubin: 0.2 mg/dL (ref 0.20–1.20)

## 2013-06-04 LAB — CBC WITH DIFFERENTIAL/PLATELET
Basophils Absolute: 0 10*3/uL (ref 0.0–0.1)
HCT: 35.9 % (ref 34.8–46.6)
HGB: 11.7 g/dL (ref 11.6–15.9)
LYMPH%: 11.7 % — ABNORMAL LOW (ref 14.0–49.7)
MONO#: 0.7 10*3/uL (ref 0.1–0.9)
NEUT#: 10.3 10*3/uL — ABNORMAL HIGH (ref 1.5–6.5)
NEUT%: 80.8 % — ABNORMAL HIGH (ref 38.4–76.8)
Platelets: 432 10*3/uL — ABNORMAL HIGH (ref 145–400)
WBC: 12.7 10*3/uL — ABNORMAL HIGH (ref 3.9–10.3)
lymph#: 1.5 10*3/uL (ref 0.9–3.3)

## 2013-06-04 LAB — TECHNOLOGIST REVIEW

## 2013-06-04 NOTE — Telephone Encounter (Signed)
Instructed patient not to take the calcium supplementation in light of her elevated calcium levels.  We will repeat on next visit.  She agrees to continued aggressive oral hydration.

## 2013-06-04 NOTE — Progress Notes (Signed)
Checked in new pt with no insurance.  Pt has filed for [facility] and is awaiting response.  We talked about the [facility] grant and drug replacement.  Her husband is working so I requested his income info to see if she qualifies for the grant.

## 2013-06-05 DIAGNOSIS — R000 Tachycardia, unspecified: Secondary | ICD-10-CM | POA: Insufficient documentation

## 2013-06-05 DIAGNOSIS — B370 Candidal stomatitis: Secondary | ICD-10-CM | POA: Insufficient documentation

## 2013-06-05 DIAGNOSIS — Z800 Family history of malignant neoplasm of digestive organs: Secondary | ICD-10-CM | POA: Insufficient documentation

## 2013-06-05 DIAGNOSIS — C19 Malignant neoplasm of rectosigmoid junction: Secondary | ICD-10-CM | POA: Insufficient documentation

## 2013-06-05 LAB — CEA: CEA: 140.5 ng/mL — ABNORMAL HIGH (ref 0.0–5.0)

## 2013-06-05 MED ORDER — NYSTATIN 100000 UNIT/ML MT SUSP: 5.0000 mL | Freq: Four times a day (QID) | OROMUCOSAL | Status: DC

## 2013-06-05 NOTE — Progress Notes (Signed)
[facility] OFFICE PROGRESS NOTE  No PCP Per Patient [address] [place] [address]  DIAGNOSIS: Metastatic colon cancer to liver - Plan: CT Chest W Contrast, CBC with Differential, Comprehensive metabolic panel, Ambulatory referral to Nutrition and Diabetic Education, US Biopsy, Ambulatory referral to Genetics, Ambulatory referral to Surgical Oncology, CANCELED: CT Biopsy, CANCELED: Ambulatory referral to General Surgery  Thrush, oral  Colorectal cancer, stage IV  Family history of colorectal cancer  Hypercalcemia  Unspecified vitamin D deficiency  Tachycardia  Chief Complaint  Patient presents with  . Colorectal cancer   CURRENT THERAPY: Biopsy only of primary site; Scheduled for metastatic ultrasound-guided biopsy.   INTERVAL HISTORY: [name] [name] 62 y.o. female with a history of newly diagnosed colorectal cancer (adenocarcinoma) probably metastatic to liver/spleen who [facility] with abdominal distention  for 2 days on 05/23/2013. She also reported nausea and vomiting. She denies any hematochezia or melana presently but reported intermittent blood in stool over the past several months. She also reported 3 episodes of loose bowel movements per day. She had an CT of her abdomen done revealing an area of bowel wall thickening around the rectosigmoid junction concerning for malignancy. In addition, lesions were noted over the left lobe of the liver. [facility] was consulted. She had colonoscopy with stent placement and biopsy on 05/25/2013. In addition, she had a MRI of the abdomen confirming above and there was also a 4.5 x 4 cm splenic mass, a 3 x 3.3 cm liver mass left lobe, suggestive of a metastatic cancer. She was found to have an elevated CEA of 225. Her pathology revealed invasive adenocarcinoma.  Of note, her history is notable for her sister with colon cancer who died at age 6. She had not had a  screening colonoscopy. She reports a 10 lb weight lost over the past several months.   MEDICAL HISTORY:No past medical history on file.  INTERIM HISTORY: has Rectal mass; Colon obstruction; Hypercalcemia; Colorectal cancer, stage IV; Family history of colorectal cancer; Thrush, oral; and Tachycardia on her problem list.    ALLERGIES:  is allergic to codeine.  MEDICATIONS: has a current medication list which includes the following prescription(s): calcium-magnesium-zinc, cholecalciferol, cyanocobalamin, hydrocodone-acetaminophen, ondansetron, polyethylene glycol, and nystatin.  SURGICAL HISTORY:  Past Surgical History  Procedure Laterality Date  . Abdominal hysterectomy    . Cesarean section  1976  . Flexible sigmoidoscopy N/A 05/25/2013    Procedure: FLEXIBLE SIGMOIDOSCOPY;  Surgeon: Rachael Fee, MD;  Location: [facility];  Service: Endoscopy;  Laterality: N/A;  needs floro  . Colonic stent placement N/A 05/25/2013    Procedure: COLONIC STENT PLACEMENT;  Surgeon: Rachael Fee, MD;  Location: [facility];  Service: Endoscopy;  Laterality: N/A;    REVIEW OF SYSTEMS:   Constitutional: Denies fevers, chills or abnormal weight loss Eyes: Denies blurriness of vision Ears, nose, mouth, throat, and face: Denies mucositis or sore throat Respiratory: Denies cough, dyspnea or wheezes Cardiovascular: Denies palpitation, chest discomfort or lower extremity swelling Gastrointestinal:  Denies nausea, heartburn or change in bowel habits Skin: Denies abnormal skin rashes Lymphatics: Denies new lymphadenopathy or easy bruising Neurological:Denies numbness, tingling or new weaknesses Behavioral/Psych: Mood is stable, no new changes  All other systems were reviewed with the patient and are negative.  PHYSICAL EXAMINATION: ECOG PERFORMANCE STATUS: 1 - Symptomatic but completely ambulatory  Blood pressure 128/66, pulse 118, temperature 98.1 F (36.7 C), temperature source Oral, resp. rate  18, height 5\' 5"  (1.651 m), weight 146 lb 11.2 oz (66.543 kg).  GENERAL:alert, no distress and comfortable; well-developed, well nourished.  SKIN: skin color, texture, turgor are normal, no rashes or significant lesions EYES: normal, Conjunctiva are pink and non-injected, sclera clear OROPHARYNX:no exudate, no erythema and lips, buccal mucosa, and tongue with whitish strips NECK: supple, thyroid normal size, non-tender, without nodularity LYMPH:  no palpable lymphadenopathy in the cervical, axillary or supraclavicular LUNGS: clear to auscultation and percussion with normal breathing effort HEART: regular rate & rhythm and no murmurs and no lower extremity edema ABDOMEN:abdomen soft, non-tender and normal bowel sounds; + distention  Musculoskeletal:no cyanosis of digits and no clubbing  NEURO: alert & oriented x 3 with fluent speech, no focal motor/sensory deficits  LABORATORY DATA: Results for orders placed in visit on 06/04/13 (from the past 48 hour(s))  CBC WITH DIFFERENTIAL     Status: Abnormal   Collection Time    06/04/13 11:13 AM      Result Value Range   WBC 12.7 (*) 3.9 - 10.3 10e3/uL   NEUT# 10.3 (*) 1.5 - 6.5 10e3/uL   HGB 11.7  11.6 - 15.9 g/dL   HCT 96.0  45.4 - 09.8 %   Platelets 432 (*) 145 - 400 10e3/uL   MCV 82.7  79.5 - 101.0 fL   MCH 27.0  25.1 - 34.0 pg   MCHC 32.6  31.5 - 36.0 g/dL   RBC 1.19  1.47 - 8.29 10e6/uL   RDW 12.5  11.2 - 14.5 %   lymph# 1.5  0.9 - 3.3 10e3/uL   MONO# 0.7  0.1 - 0.9 10e3/uL   Eosinophils Absolute 0.2  0.0 - 0.5 10e3/uL   Basophils Absolute 0.0  0.0 - 0.1 10e3/uL   NEUT% 80.8 (*) 38.4 - 76.8 %   LYMPH% 11.7 (*) 14.0 - 49.7 %   MONO% 5.4  0.0 - 14.0 %   EOS% 1.9  0.0 - 7.0 %   BASO% 0.2  0.0 - 2.0 %   nRBC 0  0 - 0 %  TECHNOLOGIST REVIEW     Status: None   Collection Time    06/04/13 11:13 AM      Result Value Range   Technologist Review       Value: large plts, occ Variant lymphs and rouleaux present  CEA     Status: Abnormal    Collection Time    06/04/13 11:13 AM      Result Value Range   CEA 140.5 (*) 0.0 - 5.0 ng/mL   Comment: Result repeated and verified.Result confirmed by automatic dilution.  COMPREHENSIVE METABOLIC PANEL (CC13)     Status: Abnormal   Collection Time    06/04/13 11:13 AM      Result Value Range   Sodium 138  136 - 145 mEq/L   Potassium 3.6  3.5 - 5.1 mEq/L   Chloride 98  98 - 109 mEq/L   CO2 30 (*) 22 - 29 mEq/L   Glucose 112  70 - 140 mg/dl   BUN 8.9  7.0 - 56.2 mg/dL   Creatinine 0.8  0.6 - 1.1 mg/dL   Total Bilirubin 1.30  0.20 - 1.20 mg/dL   Alkaline Phosphatase 75  40 - 150 U/L   AST 11  5 - 34 U/L   ALT 15  0 - 55 U/L   Total Protein 7.5  6.4 - 8.3 g/dL   Albumin 2.6 (*) 3.5 - 5.0 g/dL  Calcium 12.7 (*) 8.4 - 10.4 mg/dL   Anion Gap 10  3 - 11 mEq/L    Labs:  Lab Results  Component Value Date   WBC 12.7* 06/04/2013   HGB 11.7 06/04/2013   HCT 35.9 06/04/2013   MCV 82.7 06/04/2013   PLT 432* 06/04/2013   NEUTROABS 10.3* 06/04/2013      Chemistry      Component Value Date/Time   NA 138 06/04/2013 1113   NA 136 05/26/2013 0521   K 3.6 06/04/2013 1113   K 3.3* 05/26/2013 0521   CL 102 05/26/2013 0521   CO2 30* 06/04/2013 1113   CO2 25 05/26/2013 0521   BUN 8.9 06/04/2013 1113   BUN 16 05/26/2013 0521   CREATININE 0.8 06/04/2013 1113   CREATININE 0.61 05/26/2013 0521      Component Value Date/Time   CALCIUM 12.7* 06/04/2013 1113   CALCIUM 10.8* 05/26/2013 0521   ALKPHOS 75 06/04/2013 1113   ALKPHOS 89 05/22/2013 0930   AST 11 06/04/2013 1113   AST 26 05/22/2013 0930   ALT 15 06/04/2013 1113   ALT 15 05/22/2013 0930   BILITOT 0.20 06/04/2013 1113   BILITOT 0.2* 05/22/2013 0930     Results for ROMILDA, PROBY (MRN [id number]) as of 06/05/2013 16:08  Ref. Range 05/22/2013 09:30 05/23/2013 20:47 05/25/2013 04:33 05/26/2013 05:21 06/04/2013 11:13  Calcium Latest Range: 8.4-10.5 mg/dL 08.6 (H) 57.8 (H) 46.9 (H) 10.8 (H) 12.7 (H)   Basic Metabolic Panel:  Recent  Labs Lab 06/04/13 1113  NA 138  K 3.6  CO2 30*  GLUCOSE 112  BUN 8.9  CREATININE 0.8  CALCIUM 12.7*   GFR Estimated Creatinine Clearance: 66.5 ml/min (by C-G formula based on Cr of 0.8). Liver Function Tests:  Recent Labs Lab 06/04/13 1113  AST 11  ALT 15  ALKPHOS 75  BILITOT 0.20  PROT 7.5  ALBUMIN 2.6*   CBC:  Recent Labs Lab 06/04/13 1113  WBC 12.7*  NEUTROABS 10.3*  HGB 11.7  HCT 35.9  MCV 82.7  PLT 432*   Microbiology Recent Results (from the past 240 hour(s))  TECHNOLOGIST REVIEW     Status: None   Collection Time    06/04/13 11:13 AM      Result Value Range Status   Technologist Review     Final   Value: large plts, occ Variant lymphs and rouleaux present     Ref. Range 05/26/2013 05:21  PTH Latest Range: 14.0-72.0 pg/mL 192.5 (H)     Ref. Range 05/24/2013 18:02  Calcium Ionized Latest Range: 1.13-1.30 mmol/L 1.62 (H)     Ref. Range 06/04/2013 11:13  CEA Latest Range: 0.0-5.0 ng/mL 140.5 (H)     Ref. Range 05/25/2013 16:06  Vit D, 25-Hydroxy Latest Range: 30-89 ng/mL 19 (L)   Studies:  No results found.   RADIOGRAPHIC STUDIES: Dg Pelvis 1-2 Views  05/25/2013   CLINICAL DATA:  Colon cancer.  Insertion of colonic stent.  EXAM: PELVIS - 1-2 VIEW  COMPARISON:  05/22/2013 CT.  Fluoroscopic time:  5 min and 30 seconds  FINDINGS: Four intraoperative C-arm views submitted for review after procedure. This reveals placement of a rectosigmoid stent with narrowing of the mid aspect of the stent. Contrast was instilled. On limited imaging, no extravasation detected.  IMPRESSION: Placement of a rectosigmoid stent with narrowing of the mid aspect of the stent.   Electronically Signed   By: Bridgett Larsson M.D.   On: 05/25/2013 10:55   Mr Abdomen W  Wo Contrast  05/24/2013   CLINICAL DATA:  Abdominal pain, liver lesion on CT  EXAM: MRI ABDOMEN WITHOUT AND WITH CONTRAST  TECHNIQUE: Multiplanar multisequence MR imaging of the abdomen was performed both before  and after the administration of intravenous contrast.  CONTRAST:  14mL MULTIHANCE GADOBENATE DIMEGLUMINE 529 MG/ML IV SOLN  COMPARISON:  CT abdomen pelvis dated 05/22/2013  FINDINGS: 5 mm pulmonary nodule in the left lower lobe (series 3/image 7).  2.7 x 3.9 cm lesion in the medial segment left hepatic lobe (series 1102/image 36), corresponding to the CT abnormality, demonstrates heterogeneous/peripheral enhancement, no intracellular lipid, and restricted diffusion (series 400/image 23). This appearance is suspicious for metastasis.  Two additional hepatic lesions with characteristic peripheral nodular discontinuous enhancement of benign hemangiomas, as follows:  --7.7 x 5.0 cm lesion in the right hepatic lobe (series 1102/ image 44)  --1.9 x 2.4 cm lesion in the posterior right hepatic dome (series 1102/image 25)  Multiple additional tiny T2 hyperintense lesions likely reflect small cysts or hemangiomas (for example, series 3/images 15, 17, 19, 20, and 22), but are too small to characterize.  3.7 x 4.4 cm heterogeneously enhancing lesion along the superior spleen (series 13/ image 37), indeterminate, but worrisome for metastasis.  Pancreas and adrenal glands are within normal limits.  Gallbladder is unremarkable. No intrahepatic or extrahepatic ductal dilatation.  Kidneys are within normal limits.  No hydronephrosis.  No abdominal ascites.  No suspicious abdominal lymphadenopathy.  Multiple dilated loops of colon, possibly reflecting some degree of colonic obstruction related to known distal colonic mass on CT.  Enhancing T2 hyperintense lesion in the right T3 vertebral body (series 3/ image 37), favored to reflect a benign vertebral hemangioma.  IMPRESSION: 2.7 x 3.9 cm enhancing lesion in the medial segment left hepatic lobe, corresponding to the CT abnormality, suspicious for metastasis.  Two additional benign hemangiomas, as described above. Multiple additional tiny probable hepatic cysts or hemangiomas, too  small to characterize.  3.7 x 4.4 cm enhancing lesion along the superior spleen, indeterminate but worrisome for metastasis.  Multiple dilated loops of colon, possibly reflecting some degree of colonic obstruction related to known distal colonic mass on CT.   Electronically Signed   By: Charline Bills M.D.   On: 05/24/2013 08:43   Ct Abdomen Pelvis W Contrast  05/24/2013   ADDENDUM REPORT: 05/24/2013 08:51  ADDENDUM: Addendum 05/24/2013 at 0849 hr  There is a 4.5 x 4 cm heterogeneously enhancing splenic mass which is well-circumscribed. When correlated with the MRI performed 05/24/2013 and the colonic findings, the appearance is concerning for metastatic disease.   Electronically Signed   By: Elige Ko   On: 05/24/2013 08:51   05/24/2013   CLINICAL DATA:  Generalized abdominal pain  EXAM: CT ABDOMEN AND PELVIS WITH CONTRAST  TECHNIQUE: Multidetector CT imaging of the abdomen and pelvis was performed using the standard protocol following bolus administration of intravenous contrast.  CONTRAST:  80mL OMNIPAQUE IOHEXOL 300 MG/ML  SOLN  COMPARISON:  None.  FINDINGS: There is a 5.6 mm left lower lobe pulmonary nodule (image 6/series 3).  There is a low-attenuation area in the left hepatic lobe adjacent to the falciform ligament measuring 3 x 3.3 cm with vessels coursing through this area of low-attenuation suggesting an area of focal fatty deposition. There is a 8.2 x 5.1 cm hypodense mass in the right hepatic lobe with peripheral nodular enhancement with progressive filling in on delayed phase imaging most consistent with a hemangioma. There is a smaller  similar mass measuring 2 x 1.3 cm in the right hepatic lobe near the dome of the liver with peripheral nodular enhancement. There is no intrahepatic or extrahepatic biliary ductal dilatation. The gallbladder is normal. The spleen demonstrates no focal abnormality. There are nonobstructing left renal calculi with the largest measuring 9 mm. The right kidney,  adrenal glands and pancreas are normal. The bladder is unremarkable.  The stomach, duodenum, small intestine, and large intestine demonstrate no contrast extravasation or dilatation. There is a large amount of stool throughout the colon. There is a normal caliber appendix in the right lower quadrant without periappendiceal inflammatory changes.There is a relative area of bowel wall thickening with shouldering of the proximal and distal margins, involving the rectosigmoid junction with slight haziness along the right lateral aspect of the bowel wall. There is no pneumoperitoneum, pneumatosis, or portal venous gas. There is no abdominal or pelvic free fluid. There is no lymphadenopathy.  The abdominal aorta is normal in caliber with atherosclerosis.  There are no lytic or sclerotic osseous lesions.  IMPRESSION: 1. There is a relative area of bowel wall thickening with shouldering of the proximal and distal margins, involving the rectosigmoid junction with slight haziness along the right lateral aspect of the bowel wall. The area is concerning for malignancy. Recommend further evaluation with colonoscopy.  There is a large amount of stool throughout the colon proximal to this area.  2. There are 2 hypodense right hepatic mass is with peripheral nodular enhancement most consistent with hemangiomas.  3.  Nonobstructing left renal calculi.  4. There is a low-attenuation area in the left hepatic lobe adjacent to the falciform ligament measuring 3 x 3.3 cm with vessels coursing through this area of low-attenuation suggesting an area of focal fatty deposition. If there is further clinical concern recommend an MRI of the abdomen without and with intravenous contrast.  Electronically Signed: By: Elige Ko On: 05/22/2013 10:20   Dg C-arm 61-120 Min-no Report  05/25/2013   CLINICAL DATA: bowel obstruction   C-ARM 61-120 MINUTES  Fluoroscopy was utilized by the requesting physician.  No radiographic  interpretation.     PATHOLOGY: Diagnosis Rectum, biopsy, proximal - INVASIVE ADENOCARCINOMA. Microscopic Comment Microsatellite instability testing can be performed upon request. (HCL:caf 05/28/13) Abigail Miyamoto MD Pathologist, Electronic Signature (Case signed 05/28/2013) Specimen Gross and Clinical Information Specimen(s) Obtained: Rectum, biopsy, proximal Specimen Clinical Information stent colon mass stricture (kp) Gross Received in formalin are tan, soft tissue fragments that are submitted in toto. Number: five, Size: 0.1 to 0.5 cm, one block. (MM:gt, 05/25/13)  ASSESSMENT: [name] [name] 63 y.o. female with a history of Metastatic colon cancer to liver - Plan: CT Chest W Contrast, CBC with Differential, Comprehensive metabolic panel, Ambulatory referral to Nutrition and Diabetic Education, US Biopsy, Ambulatory referral to Genetics, Ambulatory referral to Surgical Oncology, CANCELED: CT Biopsy, CANCELED: Ambulatory referral to General Surgery  Thrush, oral  Colorectal cancer, stage IV  Family history of colorectal cancer  Hypercalcemia  Unspecified vitamin D deficiency  Tachycardia   ASSESSMENT: 62 yo AAF with family history of colon cancer, admitted with obstructive mass s/p stent and biopsy on 11/21 concerning for metastatic colon cancer with mets to liver, spleen. ECOG 1. CEA of 225 down to 140.5 today.   PLAN:  1. 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   -- We reviewed her recent imaging and pathology in detail.  Based on imaging, she is clinical stage IV.  We discussed that  this less likely to be curable.  To complete staging, we will obtain a CT of Chest to look for additional EOD. In addition, we will set up for an ultrasound-guided biopsy to confirm metastatic disease.   Prior biology of rectum as noted above.  We discussed case with pathology (Dr. Frederica Kuster) who will perform MSI testing on primary tumor sample;  We have requested additional  testing KRAS, BRAF and NRAS testing on ultrasound-guided biopsy sample (given a 85% concordance rate between primary tumor and metastatic site).    --We then reviewed her treatment options including clinical trial enrollment with [facility]17, a phase II study of irinotecan dosing in metastatic colorectal cancer (mCRC) patients receiving FOLFIRI + Bevacizumab (avastin); surgery followed by chemotherapy or vice/versa; chemotherapy alone, i.e., combinations to consider would be based on mutational status and functional status, i.e., FOLFOX.  We will refer her to surgical oncology for consideration of resection of the rectal mass prior to chemotherapy as this might reduce the chance of bowel perforation due to stenting and/or use of avastin.   --We discussed her favorable factors include her current functional status.  We will refer to nutrtional based upon her request to continue to optimize her nutritional status based on above.   2. Family history of Colon cancer. --Based on her age and family history (sister deceased at age 68 from colon cancer), referral for genetic testing for Lynch syndrome, i.e. MSI or IHC, would be appropriate. We made a referral to genetics.   3. Thrush. --Patient with mild thrush.  We will provide Nystatin swish and swallow q6 hours prn.   4. 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  --Parathyroid hormone elevated 192.5 and parathyroid hormone-related peptide pending. PTH is generally low when hypercalcemia is secondary to malignancy. In primary PTH, it is elevated.  We counseled continue aggressive fluid hydration.  She was instructed to report to emergency room with worsening constipation or confusion.   We will likely check urinary calcium and refer to endocrinology for further evaluation.   5. Vitamin D deficency due poor intake of lack of sun exposure -- Vitamin D level 19.  We will replete on her next visit.   6. Follow-up. --Patient instructed to follow-up 2 weeks  for labs and discussion of ultrasound-guided biopsy of the liver.   All questions were answered. The patient knows to call the clinic with any problems, questions or concerns. We can certainly see the patient much sooner if necessary.  I spent 25 minutes counseling the patient face to face. The total time spent in the appointment was 40 minutes.    Galia Rahm, MD 06/05/2013 4:38 PM

## 2013-06-06 ENCOUNTER — Telehealth: Payer: Self-pay | Admitting: Internal Medicine

## 2013-06-06 NOTE — Telephone Encounter (Signed)
Returned call to pt's dtr Marcelino Duster [phone]) re appts and gv dtr appts for 12/12, 12/17 and 2/23. dtr also given appt d/t/location/phone # for Dr. Abbey Chatters @ [facility] 12/9. dtr aware central radiology scheduling will call re ct and bx. Per dtr primary phone number was changed to pt's cell due to pt some time has difficulty w/home phone. Per dtr this is pt's request.

## 2013-06-08 ENCOUNTER — Other Ambulatory Visit: Payer: Self-pay | Admitting: Medical Oncology

## 2013-06-08 ENCOUNTER — Other Ambulatory Visit (HOSPITAL_COMMUNITY): Payer: Self-pay | Admitting: Radiology

## 2013-06-08 ENCOUNTER — Other Ambulatory Visit: Payer: Self-pay | Admitting: Radiology

## 2013-06-08 MED ORDER — HYDROCODONE-ACETAMINOPHEN 5-325 MG PO TABS: 1.0000 | ORAL_TABLET | Freq: Four times a day (QID) | ORAL | Status: DC | PRN

## 2013-06-11 ENCOUNTER — Encounter (HOSPITAL_COMMUNITY): Payer: Self-pay | Admitting: Pharmacy Technician

## 2013-06-12 ENCOUNTER — Ambulatory Visit (HOSPITAL_COMMUNITY)
Admission: RE | Admit: 2013-06-12 | Discharge: 2013-06-12 | Disposition: A | Discharge status: Home / Self Care | Payer: Medicaid Other | Source: Ambulatory Visit | Attending: Internal Medicine | Admitting: Internal Medicine

## 2013-06-12 ENCOUNTER — Encounter (INDEPENDENT_AMBULATORY_CARE_PROVIDER_SITE_OTHER): Payer: Self-pay | Admitting: General Surgery

## 2013-06-12 ENCOUNTER — Encounter (HOSPITAL_COMMUNITY): Payer: Self-pay

## 2013-06-12 DIAGNOSIS — C189 Malignant neoplasm of colon, unspecified: Secondary | ICD-10-CM

## 2013-06-12 DIAGNOSIS — M47814 Spondylosis without myelopathy or radiculopathy, thoracic region: Secondary | ICD-10-CM | POA: Insufficient documentation

## 2013-06-12 DIAGNOSIS — R918 Other nonspecific abnormal finding of lung field: Secondary | ICD-10-CM | POA: Insufficient documentation

## 2013-06-12 DIAGNOSIS — K7689 Other specified diseases of liver: Secondary | ICD-10-CM | POA: Insufficient documentation

## 2013-06-12 DIAGNOSIS — C787 Secondary malignant neoplasm of liver and intrahepatic bile duct: Secondary | ICD-10-CM | POA: Insufficient documentation

## 2013-06-12 DIAGNOSIS — D739 Disease of spleen, unspecified: Secondary | ICD-10-CM | POA: Insufficient documentation

## 2013-06-12 DIAGNOSIS — N200 Calculus of kidney: Secondary | ICD-10-CM | POA: Insufficient documentation

## 2013-06-12 LAB — CBC
MCH: 26.8 pg (ref 26.0–34.0)
MCHC: 32.7 g/dL (ref 30.0–36.0)
MCV: 82 fL (ref 78.0–100.0)
Platelets: 353 10*3/uL (ref 150–400)
RBC: 3.95 MIL/uL (ref 3.87–5.11)

## 2013-06-12 LAB — PROTIME-INR
INR: 0.97 (ref 0.00–1.49)
Prothrombin Time: 12.7 seconds (ref 11.6–15.2)

## 2013-06-12 MED ORDER — IOHEXOL 300 MG/ML  SOLN
80.0000 mL | Freq: Once | INTRAMUSCULAR | Status: AC | PRN
  Administered 2013-06-12: 80 mL via INTRAVENOUS

## 2013-06-12 MED ORDER — MIDAZOLAM HCL 2 MG/2ML IJ SOLN
INTRAMUSCULAR | Status: AC | PRN
Start: 2013-06-12 — End: 2013-06-12
  Administered 2013-06-12 (×2): 1 mg via INTRAVENOUS

## 2013-06-12 MED ORDER — SODIUM CHLORIDE 0.9 % IV SOLN
INTRAVENOUS | Status: DC
  Administered 2013-06-12: 11:00:00 via INTRAVENOUS

## 2013-06-12 MED ORDER — FENTANYL CITRATE 0.05 MG/ML IJ SOLN
INTRAMUSCULAR | Status: AC
  Filled 2013-06-12: qty 6

## 2013-06-12 MED ORDER — FENTANYL CITRATE 0.05 MG/ML IJ SOLN
INTRAMUSCULAR | Status: AC | PRN
  Administered 2013-06-12 (×2): 25 ug via INTRAVENOUS
  Administered 2013-06-12: 50 ug via INTRAVENOUS

## 2013-06-12 MED ORDER — ACETAMINOPHEN 500 MG PO TABS
500.0000 mg | ORAL_TABLET | Freq: Four times a day (QID) | ORAL | Status: DC | PRN
  Filled 2013-06-12: qty 1

## 2013-06-12 MED ORDER — MIDAZOLAM HCL 2 MG/2ML IJ SOLN
INTRAMUSCULAR | Status: AC
  Filled 2013-06-12: qty 6

## 2013-06-12 NOTE — H&P (Signed)
Chief Complaint: "I am here for a liver biopsy." Referring Physician: Dr. Rosie Fate HPI: [name] [name] is an 62 y.o. female with diagnosis of colon cancer and new liver lesions on CT and MRI. The patient is here today for an image guided liver lesion biopsy. She denies any chest pain or shortness of breath. She denies any active bleeding, blood in her stool or urine. She denies any fever or chills. She denies taking any blood thinners and states she did not have any complications with previous sedation.   Past Medical History: History reviewed. No pertinent past medical history.  Past Surgical History:  Past Surgical History  Procedure Laterality Date  . Abdominal hysterectomy    . Cesarean section  1976  . Flexible sigmoidoscopy N/A 05/25/2013    Procedure: FLEXIBLE SIGMOIDOSCOPY;  Surgeon: Rachael Fee, MD;  Location: [facility];  Service: Endoscopy;  Laterality: N/A;  needs floro  . Colonic stent placement N/A 05/25/2013    Procedure: COLONIC STENT PLACEMENT;  Surgeon: Rachael Fee, MD;  Location: [facility];  Service: Endoscopy;  Laterality: N/A;   Family History: History reviewed. No pertinent family history.  Social History:  reports that she quit smoking about 42 years ago. She has never used smokeless tobacco. She reports that she does not drink alcohol or use illicit drugs.  Allergies:  Allergies  Allergen Reactions  . Codeine Itching and Nausea And Vomiting      Medication List    ASK your doctor about these medications       HYDROcodone-acetaminophen 5-325 MG per tablet  Commonly known as:  NORCO/VICODIN  Take 1 tablet by mouth every 6 (six) hours as needed for moderate pain.     nystatin 100000 UNIT/ML suspension  Commonly known as:  MYCOSTATIN  Take 5 mLs by mouth 2 (two) times daily.     ondansetron 4 MG disintegrating tablet  Commonly known as:  ZOFRAN-ODT  Take 4 mg by mouth every 8 (eight) hours as needed for nausea or vomiting.     polyethylene  glycol packet  Commonly known as:  MIRALAX / GLYCOLAX  Take 34 g by mouth daily as needed for mild constipation or moderate constipation. Is using prn     VITAMIN B 12 PO  Take 1 tablet by mouth daily.     VITAMIN D PO  Take 1 tablet by mouth daily.       Please HPI for pertinent positives, otherwise complete 10 system ROS negative.  Physical Exam: BP 142/60  Pulse 96  Temp(Src) 98.2 F (36.8 C) (Oral)  Resp 20  Ht 5\' 5"  (1.651 m)  Wt 146 lb (66.225 kg)  BMI 24.30 kg/m2  SpO2 99% Body mass index is 24.3 kg/(m^2).  General Appearance:  Alert, cooperative, no distress  Head:  Normocephalic, without obvious abnormality, atraumatic  Neck: Supple, symmetrical, trachea midline  Lungs:   Clear to auscultation bilaterally, no w/r/r, respirations unlabored without use of accessory muscles.  Chest Wall:  No tenderness or deformity  Heart:  Regular rate and rhythm, S1, S2 normal, no murmur, rub or gallop.  Abdomen:   Soft, non-tender, non distended, (+) BS  Extremities: Extremities normal, atraumatic, no cyanosis or edema  Pulses: 2+ and symmetric  Neurologic: Normal affect, no gross deficits.   Results for orders placed during the hospital encounter of 06/12/13 (from the past 48 hour(s))  APTT     Status: None   Collection Time    06/12/13 11:10 AM  Result Value Range   aPTT 26  24 - 37 seconds  CBC     Status: Abnormal   Collection Time    06/12/13 11:10 AM      Result Value Range   WBC 5.6  4.0 - 10.5 K/uL   RBC 3.95  3.87 - 5.11 MIL/uL   Hemoglobin 10.6 (*) 12.0 - 15.0 g/dL   HCT 16.1 (*) 09.6 - 04.5 %   MCV 82.0  78.0 - 100.0 fL   MCH 26.8  26.0 - 34.0 pg   MCHC 32.7  30.0 - 36.0 g/dL   RDW 40.9  81.1 - 91.4 %   Platelets 353  150 - 400 K/uL  PROTIME-INR     Status: None   Collection Time    06/12/13 11:10 AM      Result Value Range   Prothrombin Time 12.7  11.6 - 15.2 seconds   INR 0.97  0.00 - 1.49   No results found.  Assessment/Plan Colon  cancer. Liver lesions on CT 05/22/13, MRI 05/24/13 Request for image guided liver lesion biopsy. Patient has been NPO, no blood thinners, labs and images reviewed. Risks and Benefits discussed with the patient. All of the patient's questions were answered, patient is agreeable to proceed. Consent signed and in chart.   Pattricia Boss D PA-C 06/12/2013, 12:40 PM

## 2013-06-12 NOTE — Procedures (Signed)
US guided FNA of left hepatic lesion.  3 FNAs obtained with 20 gauge needles.  No immediate complication.

## 2013-06-12 NOTE — Progress Notes (Signed)
Patient was accompanied by nursing staff to Radiology for a scheduled CT Scan. Patient's daughter and niece is with her. Patient is discharged from Short Stay.

## 2013-06-15 ENCOUNTER — Ambulatory Visit: Payer: Medicaid Other | Admitting: Nutrition

## 2013-06-15 NOTE — Progress Notes (Signed)
Patient is a 62 year old female diagnosed with metastatic colon cancer.  She is a patient of Dr. Rosie Fate.  Past medical history includes vitamin D deficiency, and thrush.  Medications include vitamin D, vitamin B12, Zofran, and MiraLax.  Labs include albumin of 2.6, and calcium of 12.7.  Height: 65 inches. Weight: 146.7 pounds on December 1. BMI: 24.41.    Patient reports she has discontinued her calcium supplement after physician requested her to do so.  She has a good appetite.  She has some gas for which she takes beano with good results.  She believes she has some lactose intolerance.  She has questions about the acid/alkaline diet and it's efficacy.  Nutrition diagnosis: Food and nutrition related knowledge deficit related to new diagnosis of metastatic colon cancer and associated treatments as evidenced by no prior need for nutrition related information.  Intervention: Patient was educated to consume smaller, more frequent meals with adequate calories and protein to promote weight maintenance.  I've educated her on foods to avoid, which can sometimes cause increased gas.  She will discuss medications with physician as needed.  I have educated her on high-protein foods.  I've also educated her on the acid/alkaline diet and evidenced based research completed.  Agree with discontinuing calcium supplements.  She was given fact sheets and my contact information.  Questions were answered.  Teach back method used.  Monitoring, evaluation, goals: Patient will tolerate adequate calories and protein for weight maintenance throughout treatment.  Next visit: Followup, during chemotherapy as needed.

## 2013-06-19 ENCOUNTER — Ambulatory Visit: Payer: Self-pay | Admitting: Gastroenterology

## 2013-06-19 ENCOUNTER — Ambulatory Visit (INDEPENDENT_AMBULATORY_CARE_PROVIDER_SITE_OTHER): Payer: Medicaid Other | Admitting: General Surgery

## 2013-06-19 ENCOUNTER — Encounter (INDEPENDENT_AMBULATORY_CARE_PROVIDER_SITE_OTHER): Payer: Self-pay | Admitting: General Surgery

## 2013-06-19 VITALS — BP 118/68 | HR 116 | Temp 97.7°F | Resp 14 | Ht 65.5 in | Wt 143.0 lb

## 2013-06-19 DIAGNOSIS — C19 Malignant neoplasm of rectosigmoid junction: Secondary | ICD-10-CM

## 2013-06-19 NOTE — Progress Notes (Signed)
Subjective:     Patient ID: [name] [name], female   DOB: 09/08/1950, 62 y.o.   MRN: [id number]  HPI  She is here for followup of her stage IV colon cancer. She went to the hospital with a near total large bowel structure. A. palliative stent was placed and she is much better. She is eating well. Bowels are moving. She had an image guided biopsy of the liver lesion that was positive for metastatic colorectal cancer. She is due to see the oncologist tomorrow.   Review of SystemsNo further abdominal pain. No nausea or vomiting. She does have a rash on her abdominal wall from scratching the area.     Objective:   Physical Exam Gen.-she looks well and is in no acute distress.  Abdomen-soft, nontender, nondistended, no palpable mass.  Lymph nodes-no palpable supraclavicular adenopathy, no periumbilical adenopathy.    Assessment:     Stage IV rectosigmoid colon cancer. Obstruction is relieved by stent.     Plan:     We'll place a Port-A-Cath if Dr. [name] thinks she needs 14 chemotherapy.   The procedure risks and aftercare been explained. Risks include but are not limited to bleeding, infection, malfunction, pneumothorax, wound problems, DVT.       

## 2013-06-19 NOTE — Patient Instructions (Signed)
We will put in a Port-a-cath if Dr. Rosie Fate feels it is necessary.

## 2013-06-20 ENCOUNTER — Other Ambulatory Visit (HOSPITAL_BASED_OUTPATIENT_CLINIC_OR_DEPARTMENT_OTHER): Payer: Medicaid Other

## 2013-06-20 ENCOUNTER — Telehealth: Payer: Self-pay | Admitting: Internal Medicine

## 2013-06-20 ENCOUNTER — Ambulatory Visit (HOSPITAL_BASED_OUTPATIENT_CLINIC_OR_DEPARTMENT_OTHER): Payer: Medicaid Other | Admitting: Internal Medicine

## 2013-06-20 ENCOUNTER — Encounter (HOSPITAL_BASED_OUTPATIENT_CLINIC_OR_DEPARTMENT_OTHER): Payer: Self-pay | Admitting: *Deleted

## 2013-06-20 ENCOUNTER — Encounter (INDEPENDENT_AMBULATORY_CARE_PROVIDER_SITE_OTHER): Payer: Self-pay | Admitting: General Surgery

## 2013-06-20 VITALS — BP 126/74 | HR 123 | Temp 98.0°F | Resp 19 | Ht 65.0 in | Wt 143.9 lb

## 2013-06-20 DIAGNOSIS — C787 Secondary malignant neoplasm of liver and intrahepatic bile duct: Secondary | ICD-10-CM

## 2013-06-20 DIAGNOSIS — E559 Vitamin D deficiency, unspecified: Secondary | ICD-10-CM

## 2013-06-20 DIAGNOSIS — C189 Malignant neoplasm of colon, unspecified: Secondary | ICD-10-CM

## 2013-06-20 DIAGNOSIS — Z800 Family history of malignant neoplasm of digestive organs: Secondary | ICD-10-CM

## 2013-06-20 DIAGNOSIS — C19 Malignant neoplasm of rectosigmoid junction: Secondary | ICD-10-CM

## 2013-06-20 LAB — COMPREHENSIVE METABOLIC PANEL (CC13)
ALT: 11 U/L (ref 0–55)
AST: 14 U/L (ref 5–34)
Alkaline Phosphatase: 78 U/L (ref 40–150)
BUN: 11.8 mg/dL (ref 7.0–26.0)
CO2: 26 mEq/L (ref 22–29)
Calcium: 12.6 mg/dL — ABNORMAL HIGH (ref 8.4–10.4)
Glucose: 106 mg/dl (ref 70–140)
Potassium: 4.4 mEq/L (ref 3.5–5.1)
Sodium: 141 mEq/L (ref 136–145)
Total Bilirubin: 0.22 mg/dL (ref 0.20–1.20)

## 2013-06-20 LAB — CBC WITH DIFFERENTIAL/PLATELET
BASO%: 0.7 % (ref 0.0–2.0)
Basophils Absolute: 0 10*3/uL (ref 0.0–0.1)
EOS%: 6 % (ref 0.0–7.0)
Eosinophils Absolute: 0.4 10*3/uL (ref 0.0–0.5)
LYMPH%: 22.9 % (ref 14.0–49.7)
MCH: 27 pg (ref 25.1–34.0)
MCV: 83.2 fL (ref 79.5–101.0)
MONO#: 0.4 10*3/uL (ref 0.1–0.9)
MONO%: 6 % (ref 0.0–14.0)
NEUT#: 4.3 10*3/uL (ref 1.5–6.5)
Platelets: 377 10*3/uL (ref 145–400)
RBC: 4.1 10*6/uL (ref 3.70–5.45)
RDW: 13.1 % (ref 11.2–14.5)
lymph#: 1.5 10*3/uL (ref 0.9–3.3)

## 2013-06-20 NOTE — Progress Notes (Signed)
Patient ID: [name] [name], female   DOB: Feb 07, 1951, 62 y.o.   MRN: [id number] I spoke with Dr. Rosie Fate (Medical Oncologist) about her. He wants to start her on chemotherapy and would like to have a Port-A-Cath placed. He was wanting to enroll her in a clinical trial using Avastin.  We discussed the fact that there is a significantly increased risk of perforation and people who have enteral stents and were given Avastin so I did not recommend it.  We will schedule her to have Port-A-Cath insertion.

## 2013-06-20 NOTE — Telephone Encounter (Signed)
Called patient at 4:40 pm on 06/20/13 to discuss 3 things: 1) Portacath is being scheduled by surgery; 2) No longer a candidate for clinical trial secondary to risk of perforation with stent in place in setting of avastatin required with trial enrollment; 3) referral to endocrinologist for further evaluation of elevated calcium level.  She reports that she is asymptomatic without confusion, constipation.  She will call to report any symptoms of concern.

## 2013-06-20 NOTE — Patient Instructions (Signed)
Colorectal Cancer Colorectal cancer is an abnormal growth of tissue (tumor) in the colon or rectum that is cancerous (malignant). Unlike noncancerous (benign) tumors, malignant tumors can spread to other parts of your body. The colon is the large bowel or large intestine. The rectum is the last several inches of the colon.  RISK FACTORS The exact cause of colorectal cancer is unknown. However, the following factors may increase your chances of getting colorectal cancer:   Age older than 24 years.   Abnormal growths (polyps) on the inner wall of the colon or rectum.   Diabetes.   African American race.   Family history of hereditary nonpolyposis colorectal cancer. This condition is caused by changes in the genes that are responsible for repairing mismatched DNA.   Personal history of cancer. A person who has already had colorectal cancer may develop it a second time. Also, women with a history of ovarian, uterine, or breast cancer are at a somewhat higher risk of developing colorectal cancer.  Certain hereditary conditions.  Eating a diet that is high in fat (especially animal fat) and low in fiber, fruits, and vegetables.  Sedentary lifestyle.  Inflammatory bowel disease, including ulcerative colitis and Crohn disease.   Smoking.   Excessive alcohol use.  SYMPTOMS Early colorectal cancer often does not cause symptoms. As the cancer grows, symptoms may include:   Changes in bowel habits.  Diarrhea.   Constipation.   Feeling like the bowel does not empty completely after a bowel movement.   Blood in the stool.   Stools that are narrower than usual.   Abdominal discomfort, pain, bloating, fullness, or cramps.  Frequent gas pain.   Unexplained weight loss.   Constant tiredness.   Nausea and vomiting.  DIAGNOSIS  Your health care provider will ask about your medical history. He or she may also perform a number of procedures, such as:   A physical  exam.  A digital rectal exam.  A fecal occult blood test.  A barium enema.  Blood tests.   X-rays.   Imaging tests, such as CT scans or MRIs.   Taking a tissue sample (biopsy) from your colon or rectum to look for cancer cells.   A sigmoidoscopy to view the inside of the last part of your colon.   A colonoscopy to view the inside of your entire colon.   An endorectal ultrasound to see how deep a rectal tumor has grown and whether the cancer has spread to lymph nodes or other nearby tissues.  Your cancer will be staged to determine its severity and extent. Staging is a careful attempt to find out the size of the tumor, whether the cancer has spread, and if so, to what parts of the body. You may need to have more tests to determine the stage of your cancer. The test results will help determine what treatment plan is best for you.   Stage 0 The cancer is found only in the innermost lining of the colon or rectum.   Stage I The cancer has grown into the inner wall of the colon or rectum. The cancer has not yet reached the outer wall of the colon.   Stage II The cancer extends more deeply into or through the wall of the colon or rectum. It may have invaded nearby tissue, but cancer cells have not spread to the lymph nodes.   Stage III The cancer has spread to nearby lymph nodes but not to other parts of the body.  Stage IV The cancer has spread to other parts of the body, such as the liver or lungs.  Your health care provider may tell you the detailed stage of your cancer, which includes both a number and a letter.  TREATMENT  Depending on the type and stage, colorectal cancer may be treated with surgery, radiation therapy, chemotherapy, targeted therapy, or radiofrequency ablation. Some people have a combination of these therapies. Surgery may be done to remove the polyps from your colon. In early stages, your health care provider may be able to do this during a  colonoscopy. In later stages, surgery may be done to remove part of your colon.  HOME CARE INSTRUCTIONS   Only take over-the-counter or prescription medicines for pain, discomfort, or fever as directed by your health care provider.   Maintain a healthy diet.   Consider joining a support group. This may help you learn to cope with the stress of having colorectal cancer.   Seek advice to help you manage treatment of side effects.   Keep all follow-up appointments as directed by your health care provider.   Inform your cancer specialist if you are admitted to the hospital.  SEEK MEDICAL CARE IF:  Your diarrhea or constipation does not go away.   Your bowel habits change.  You have increased abdominal pain.   You notice new fatigue or weakness.  You lose weight. Document Released: 06/21/2005 Document Revised: 02/21/2013 Document Reviewed: 12/14/2012 [facility] Patient Information 2014 [place], [place]. Metastatic Cancer, Questions and Answers KEY POINTS  Cancer happens when cells become abnormal and grow without control.  Where the cancer started is called the primary cancer or the primary tumor.  Metastatic cancer happens when cancer cells spread from the place where it started to other parts of the body.  When cancer spreads, the metastatic cancer keeps the same type of cells and the same name as the primary tumor.  The most common sites of metastasis are the lungs, bones, liver, and brain.  Treatment for metastatic cancer usually depends on the type of cancer. It also depends on the size and location of the metastasis. WHAT IS CANCER?   Cancer is a group of many related diseases. All cancers begin in cells. Cells are the building blocks that make up tissues. Cancer that arises from organs and solid tissues is called a solid tumor. Cancer that begins in blood cells is called leukemia, multiple myeloma, or lymphoma.  Normally, cells grow and divide to form new cells as  the body needs them. When cells grow old and die, new cells take their place. Sometimes this orderly process goes wrong. New cells form when the body does not need them. Old cells do not die when they should.  The extra cells form a mass of tissue. This is called a growth or tumor. Tumors can be either not cancerous (benign) or cancerous (malignant). Benign tumors do not spread to other parts of the body. They are rarely a threat to life. Malignant tumors can spread (metastasize) and may be life threatening. WHAT IS PRIMARY CANCER?  Cancer can begin in any organ or tissue of the body. The original tumor is called the primary cancer or primary tumor. It is usually named for the part of the body or the type of cell in which it begins. WHAT IS METASTASIS, AND HOW DOES IT HAPPEN?   Metastasis means the spread of cancer. Cancer cells can break away from a primary tumor and enter the bloodstream or lymphatic  system. This is the system that produces, stores, and carries the cells that fight infections. That is how cancer cells spread to other parts of the body.  When cancer cells spread and form a new tumor in a different organ, the new tumor is a metastatic tumor. The cells in the metastatic tumor come from the original tumor. For example, if breast cancer spreads to the lungs, the metastatic tumor in the lung is made up of cancerous breast cells. It is not made of lung cells. In this case, the disease in the lungs is metastatic breast cancer (not lung cancer). Under a microscope, metastatic breast cancer cells generally look the same as the cancer cells in the breast. WHERE DOES CANCER SPREAD?   Cancer cells can spread to almost any part of the body. Cancer cells frequently spread to lymph nodes (rounded masses of lymphatic tissue) near the primary tumor (regional lymph nodes). This is called lymph node involvement or regional disease. Cancer that spreads to other organs or to lymph nodes far from the primary  tumor is called metastatic disease. Caregivers sometimes also call this distant disease.  The most common sites of metastasis from solid tumors are the lungs, bones, liver, and brain. Some cancers tend to spread to certain parts of the body. For example, lung cancer often metastasizes to the brain or bones. Colon cancer often spreads to the liver. Prostate cancer tends to spread to the bones. Breast cancer commonly spreads to the bones, lungs, liver, or brain. But each of these cancers can spread to other parts of the body as well.  Because blood cells travel throughout the body, leukemia, multiple myeloma, and lymphoma cells are usually not localized when the cancer is diagnosed. Tumor cells may be found in the blood, several lymph nodes, or other parts of the body such as the liver or bones. This type of spread is not referred to as metastasis. ARE THERE SYMPTOMS OF METASTATIC CANCER?   Some people with metastatic cancer do not have symptoms. Their metastases are found by X-rays and other tests performed for other reasons.  When symptoms of metastatic cancer occur, the type and frequency of the symptoms will depend on the size and location of the metastasis. For example, cancer that spreads to the bones is likely to cause pain and can lead to bone fractures. Cancer that spreads to the brain can cause a variety of symptoms. These include headaches, seizures, and unsteadiness. Shortness of breath may be a sign of lung involvement. Abdominal swelling or yellowing of the skin (jaundice) can indicate that cancer has spread to the liver.  Sometimes a person's primary cancer is discovered only after the metastatic tumor causes symptoms. For example, a man whose prostate cancer has spread to the bones in his pelvis may have lower back pain (caused by the cancer in his bones) before he experiences any symptoms from the primary tumor in his prostate. HOW DOES THE CAREGIVER KNOW WHETHER A CANCER IS PRIMARY OR A  METASTATIC TUMOR?  To determine whether a tumor is primary or metastatic, the tumor will be examined under a microscope. In general, cancer cells look like abnormal versions of cells in the tissue where the cancer began. Using specialized diagnostic tests, a trained person is often able to tell where the cancer cells came from. Markers or antigens found in or on the cancer cells can indicate the primary site of the cancer.  Metastatic cancers may be found before or at the same time as  the primary tumor, or months or years later. When a new tumor is found in a patient who has been treated for cancer in the past, it is more often a metastasis than another primary tumor. IS IT POSSIBLE TO HAVE A METASTATIC TUMOR WITHOUT HAVING A PRIMARY CANCER?  No. A metastatic tumor always starts from cancer cells in another part of the body. In most cases, when a metastatic tumor is found first, the primary tumor can be found. The search for the primary tumor may involve lab tests, X-rays, and other procedures. However, in a small number of cases, a metastatic tumor is diagnosed but the primary tumor cannot be found, in spite of extensive tests. The tumor is metastatic because the cells are not like those in the organ or tissue in which the tumor is found. The primary tumor is called unknown or hidden (occult). The patient is said to have cancer of unknown primary origin (CUP). Because diagnostic techniques are constantly improving, the number of cases of CUP is going down.  WHAT TREATMENTS ARE USED FOR METASTATIC CANCER?   When cancer has metastasized, it may be treated with:  Chemotherapy.  Radiation therapy.  Biological therapy.  Hormone therapy.  Surgery.  Cryosurgery.  A combination of these.  The choice of treatment generally depends on the:  Type of primary cancer.  Size and location of the metastasis.  Patient's age and general health.  Types of treatments the patient has had in the past. In  patients with CUP, it is possible to treat the disease even though the primary tumor has not been located. The goal of treatment may be to control the cancer, or to relieve symptoms or side effects of treatment. ARE NEW TREATMENTS FOR METASTATIC CANCER BEING DEVELOPED?  Yes, many new cancer treatments are under study. To develop new treatments, the NCI sponsors clinical trials (research studies) with cancer patients in many hospitals, universities, medical schools, and cancer centers around the country. Clinical trials are a critical step in the improvement of treatment. Before any new treatment can be recommended for general use, doctors conduct studies to find out whether the treatment is both safe for patients and effective against the disease. The results of such studies have led to progress not only in the treatment of cancer, but in the detection, diagnosis, and prevention of the disease as well. Patients interested in taking part in a clinical trial should talk with their caregivers. FOR MORE INFORMATION National Cancer Institute (NCI): www.cancer.gov Document Released: 10/26/2004 Document Revised: 09/13/2011 Document Reviewed: 06/13/2008 [facility] Patient Information 2014 [place], [place]. Oxaliplatin Injection What is this medicine? OXALIPLATIN (ox AL i PLA tin) is a chemotherapy drug. It targets fast dividing cells, like cancer cells, and causes these cells to die. This medicine is used to treat cancers of the colon and rectum, and many other cancers. This medicine may be used for other purposes; ask your health care provider or pharmacist if you have questions. COMMON BRAND NAME(S): Eloxatin What should I tell my health care provider before I take this medicine? They need to know if you have any of these conditions: -kidney disease -an unusual or allergic reaction to oxaliplatin, other chemotherapy, other medicines, foods, dyes, or preservatives -pregnant or trying to get  pregnant -breast-feeding How should I use this medicine? This drug is given as an infusion into a vein. It is administered in a hospital or clinic by a specially trained health care professional. Talk to your pediatrician regarding the use of this  medicine in children. Special care may be needed. Overdosage: If you think you have taken too much of this medicine contact a poison control center or emergency room at once. NOTE: This medicine is only for you. Do not share this medicine with others. What if I miss a dose? It is important not to miss a dose. Call your doctor or health care professional if you are unable to keep an appointment. What may interact with this medicine? -medicines to increase blood counts like filgrastim, pegfilgrastim, sargramostim -probenecid -some antibiotics like amikacin, gentamicin, neomycin, polymyxin B, streptomycin, tobramycin -zalcitabine Talk to your doctor or health care professional before taking any of these medicines: -acetaminophen -aspirin -ibuprofen -ketoprofen -naproxen This list may not describe all possible interactions. Give your health care provider a list of all the medicines, herbs, non-prescription drugs, or dietary supplements you use. Also tell them if you smoke, drink alcohol, or use illegal drugs. Some items may interact with your medicine. What should I watch for while using this medicine? Your condition will be monitored carefully while you are receiving this medicine. You will need important blood work done while you are taking this medicine. This medicine can make you more sensitive to cold. Do not drink cold drinks or use ice. Cover exposed skin before coming in contact with cold temperatures or cold objects. When out in cold weather wear warm clothing and cover your mouth and nose to warm the air that goes into your lungs. Tell your doctor if you get sensitive to the cold. This drug may make you feel generally unwell. This is not  uncommon, as chemotherapy can affect healthy cells as well as cancer cells. Report any side effects. Continue your course of treatment even though you feel ill unless your doctor tells you to stop. In some cases, you may be given additional medicines to help with side effects. Follow all directions for their use. Call your doctor or health care professional for advice if you get a fever, chills or sore throat, or other symptoms of a cold or flu. Do not treat yourself. This drug decreases your body's ability to fight infections. Try to avoid being around people who are sick. This medicine may increase your risk to bruise or bleed. Call your doctor or health care professional if you notice any unusual bleeding. Be careful brushing and flossing your teeth or using a toothpick because you may get an infection or bleed more easily. If you have any dental work done, tell your dentist you are receiving this medicine. Avoid taking products that contain aspirin, acetaminophen, ibuprofen, naproxen, or ketoprofen unless instructed by your doctor. These medicines may hide a fever. Do not become pregnant while taking this medicine. Women should inform their doctor if they wish to become pregnant or think they might be pregnant. There is a potential for serious side effects to an unborn child. Talk to your health care professional or pharmacist for more information. Do not breast-feed an infant while taking this medicine. Call your doctor or health care professional if you get diarrhea. Do not treat yourself. What side effects may I notice from receiving this medicine? Side effects that you should report to your doctor or health care professional as soon as possible: -allergic reactions like skin rash, itching or hives, swelling of the face, lips, or tongue -low blood counts - This drug may decrease the number of white blood cells, red blood cells and platelets. You may be at increased risk for infections and  bleeding. -  signs of infection - fever or chills, cough, sore throat, pain or difficulty passing urine -signs of decreased platelets or bleeding - bruising, pinpoint red spots on the skin, black, tarry stools, nosebleeds -signs of decreased red blood cells - unusually weak or tired, fainting spells, lightheadedness -breathing problems -chest pain, pressure -cough -diarrhea -jaw tightness -mouth sores -nausea and vomiting -pain, swelling, redness or irritation at the injection site -pain, tingling, numbness in the hands or feet -problems with balance, talking, walking -redness, blistering, peeling or loosening of the skin, including inside the mouth -trouble passing urine or change in the amount of urine Side effects that usually do not require medical attention (report to your doctor or health care professional if they continue or are bothersome): -changes in vision -constipation -hair loss -loss of appetite -metallic taste in the mouth or changes in taste -stomach pain This list may not describe all possible side effects. Call your doctor for medical advice about side effects. You may report side effects to FDA at 1-800-FDA-1088. Where should I keep my medicine? This drug is given in a hospital or clinic and will not be stored at home. NOTE: This sheet is a summary. It may not cover all possible information. If you have questions about this medicine, talk to your doctor, pharmacist, or health care provider.  2014, [employer]. (2008-01-16 17:22:47) Leucovorin injection What is this medicine? LEUCOVORIN (loo koe VOR in) is used to prevent or treat the harmful effects of some medicines. This medicine is used to treat anemia caused by a low amount of folic acid in the body. It is also used with 5-fluorouracil (5-FU) to treat colon cancer. This medicine may be used for other purposes; ask your health care provider or pharmacist if you have questions. What should I tell my  health care provider before I take this medicine? They need to know if you have any of these conditions: -anemia from low levels of vitamin B-12 in the blood -an unusual or allergic reaction to leucovorin, folic acid, other medicines, foods, dyes, or preservatives -pregnant or trying to get pregnant -breast-feeding How should I use this medicine? This medicine is for injection into a muscle or into a vein. It is given by a health care professional in a hospital or clinic setting. Talk to your pediatrician regarding the use of this medicine in children. Special care may be needed. Overdosage: If you think you have taken too much of this medicine contact a poison control center or emergency room at once. NOTE: This medicine is only for you. Do not share this medicine with others. What if I miss a dose? This does not apply. What may interact with this medicine? -capecitabine -fluorouracil -phenobarbital -phenytoin -primidone -trimethoprim-sulfamethoxazole This list may not describe all possible interactions. Give your health care provider a list of all the medicines, herbs, non-prescription drugs, or dietary supplements you use. Also tell them if you smoke, drink alcohol, or use illegal drugs. Some items may interact with your medicine. What should I watch for while using this medicine? Your condition will be monitored carefully while you are receiving this medicine. This medicine may increase the side effects of 5-fluorouracil, 5-FU. Tell your doctor or health care professional if you have diarrhea or mouth sores that do not get better or that get worse. What side effects may I notice from receiving this medicine? Side effects that you should report to your doctor or health care professional as soon as possible: -allergic reactions like skin rash, itching  or hives, swelling of the face, lips, or tongue -breathing problems -fever, infection -mouth sores -unusual bleeding or  bruising -unusually weak or tired Side effects that usually do not require medical attention (report to your doctor or health care professional if they continue or are bothersome): -constipation or diarrhea -loss of appetite -nausea, vomiting This list may not describe all possible side effects. Call your doctor for medical advice about side effects. You may report side effects to FDA at 1-800-FDA-1088. Where should I keep my medicine? This drug is given in a hospital or clinic and will not be stored at home. NOTE: This sheet is a summary. It may not cover all possible information. If you have questions about this medicine, talk to your doctor, pharmacist, or health care provider.  2014, [employer] Standard. (2007-12-26 16:50:29) Fluorouracil, 5-FU injection What is this medicine? FLUOROURACIL, 5-FU (flure oh YOOR a sil) is a chemotherapy drug. It slows the growth of cancer cells. This medicine is used to treat many types of cancer like breast cancer, colon or rectal cancer, pancreatic cancer, and stomach cancer. This medicine may be used for other purposes; ask your health care provider or pharmacist if you have questions. COMMON BRAND NAME(S): Adrucil What should I tell my health care provider before I take this medicine? They need to know if you have any of these conditions: -blood disorders -dihydropyrimidine dehydrogenase (DPD) deficiency -infection (especially a virus infection such as chickenpox, cold sores, or herpes) -kidney disease -liver disease -malnourished, poor nutrition -recent or ongoing radiation therapy -an unusual or allergic reaction to fluorouracil, other chemotherapy, other medicines, foods, dyes, or preservatives -pregnant or trying to get pregnant -breast-feeding How should I use this medicine? This drug is given as an infusion or injection into a vein. It is administered in a hospital or clinic by a specially trained health care professional. Talk to your  pediatrician regarding the use of this medicine in children. Special care may be needed. Overdosage: If you think you have taken too much of this medicine contact a poison control center or emergency room at once. NOTE: This medicine is only for you. Do not share this medicine with others. What if I miss a dose? It is important not to miss your dose. Call your doctor or health care professional if you are unable to keep an appointment. What may interact with this medicine? -allopurinol -cimetidine -dapsone -digoxin -hydroxyurea -leucovorin -levamisole -medicines for seizures like ethotoin, fosphenytoin, phenytoin -medicines to increase blood counts like filgrastim, pegfilgrastim, sargramostim -medicines that treat or prevent blood clots like warfarin, enoxaparin, and dalteparin -methotrexate -metronidazole -pyrimethamine -some other chemotherapy drugs like busulfan, cisplatin, estramustine, vinblastine -trimethoprim -trimetrexate -vaccines Talk to your doctor or health care professional before taking any of these medicines: -acetaminophen -aspirin -ibuprofen -ketoprofen -naproxen This list may not describe all possible interactions. Give your health care provider a list of all the medicines, herbs, non-prescription drugs, or dietary supplements you use. Also tell them if you smoke, drink alcohol, or use illegal drugs. Some items may interact with your medicine. What should I watch for while using this medicine? Visit your doctor for checks on your progress. This drug may make you feel generally unwell. This is not uncommon, as chemotherapy can affect healthy cells as well as cancer cells. Report any side effects. Continue your course of treatment even though you feel ill unless your doctor tells you to stop. In some cases, you may be given additional medicines to help with side effects. Follow all directions  for their use. Call your doctor or health care professional for advice if  you get a fever, chills or sore throat, or other symptoms of a cold or flu. Do not treat yourself. This drug decreases your body's ability to fight infections. Try to avoid being around people who are sick. This medicine may increase your risk to bruise or bleed. Call your doctor or health care professional if you notice any unusual bleeding. Be careful brushing and flossing your teeth or using a toothpick because you may get an infection or bleed more easily. If you have any dental work done, tell your dentist you are receiving this medicine. Avoid taking products that contain aspirin, acetaminophen, ibuprofen, naproxen, or ketoprofen unless instructed by your doctor. These medicines may hide a fever. Do not become pregnant while taking this medicine. Women should inform their doctor if they wish to become pregnant or think they might be pregnant. There is a potential for serious side effects to an unborn child. Talk to your health care professional or pharmacist for more information. Do not breast-feed an infant while taking this medicine. Men should inform their doctor if they wish to father a child. This medicine may lower sperm counts. Do not treat diarrhea with over the counter products. Contact your doctor if you have diarrhea that lasts more than 2 days or if it is severe and watery. This medicine can make you more sensitive to the sun. Keep out of the sun. If you cannot avoid being in the sun, wear protective clothing and use sunscreen. Do not use sun lamps or tanning beds/booths. What side effects may I notice from receiving this medicine? Side effects that you should report to your doctor or health care professional as soon as possible: -allergic reactions like skin rash, itching or hives, swelling of the face, lips, or tongue -low blood counts - this medicine may decrease the number of white blood cells, red blood cells and platelets. You may be at increased risk for infections and  bleeding. -signs of infection - fever or chills, cough, sore throat, pain or difficulty passing urine -signs of decreased platelets or bleeding - bruising, pinpoint red spots on the skin, black, tarry stools, blood in the urine -signs of decreased red blood cells - unusually weak or tired, fainting spells, lightheadedness -breathing problems -changes in vision -chest pain -mouth sores -nausea and vomiting -pain, swelling, redness at site where injected -pain, tingling, numbness in the hands or feet -redness, swelling, or sores on hands or feet -stomach pain -unusual bleeding Side effects that usually do not require medical attention (report to your doctor or health care professional if they continue or are bothersome): -changes in finger or toe nails -diarrhea -dry or itchy skin -hair loss -headache -loss of appetite -sensitivity of eyes to the light -stomach upset -unusually teary eyes This list may not describe all possible side effects. Call your doctor for medical advice about side effects. You may report side effects to FDA at 1-800-FDA-1088. Where should I keep my medicine? This drug is given in a hospital or clinic and will not be stored at home. NOTE: This sheet is a summary. It may not cover all possible information. If you have questions about this medicine, talk to your doctor, pharmacist, or health care provider.  2014, [employer]. (2007-10-25 13:53:16)

## 2013-06-21 ENCOUNTER — Encounter: Payer: Self-pay | Admitting: Medical Oncology

## 2013-06-21 NOTE — Progress Notes (Signed)
Received a call from IR regarding order for port placement. Pt has an appointment with out patient surgery to have port placed 06/25/13. Dr. Rosie Fate placed an order with IR to place port. I explained Dr. Abbey Chatters will place port and order for IR placement discontinued.

## 2013-06-22 ENCOUNTER — Telehealth: Payer: Self-pay | Admitting: *Deleted

## 2013-06-22 ENCOUNTER — Telehealth: Payer: Self-pay | Admitting: Medical Oncology

## 2013-06-22 NOTE — Progress Notes (Signed)
[facility] OFFICE PROGRESS NOTE  No PCP Per Patient [address] [place] [address]  DIAGNOSIS: Metastatic colon cancer to liver - Plan: CBC with Differential, Comprehensive metabolic panel, CANCELED: IR Fluoro Guide CV Line Right  Hypercalcemia - Plan: Ambulatory referral to Endocrinology  Colorectal cancer, stage IV  Family history of colorectal cancer  Chief Complaint  Patient presents with  . Metastatic colon cancer to liver   CURRENT THERAPY: Biopsy only of primary site; Scheduled for metastatic ultrasound-guided biopsy.   INTERVAL HISTORY: [name] [name] 63 y.o. female with a history of newly diagnosed colorectal cancer (adenocarcinoma)  metastatic to liver/spleen who [facility] distention  for 2 days on 05/23/2013.   On her original presentation ,  she denies any hematochezia or melana presently but reported intermittent blood in stool over the past several months. She also reported 3 episodes of loose bowel movements per day. She had an CT of her abdomen done revealing an area of bowel wall thickening around the rectosigmoid junction concerning for malignancy. In addition, lesions were noted over the left lobe of the liver. [facility] was consulted. She had colonoscopy with stent placement and biopsy on 05/25/2013. In addition, she had a MRI of the abdomen confirming above and there was also a 4.5 x 4 cm splenic mass, a 3 x 3.3 cm liver mass left lobe, suggestive of a metastatic cancer. She was found to have an elevated CEA of 225. Her pathology revealed invasive adenocarcinoma.  Of note, her history is notable for her sister with colon cancer who died at age 32. She had not had a screening colonoscopy. She reports a 10 lb weight lost over the past several months.   She was last seen by me on 06/04/2013. She was scheduled for liver biopsy to confirm metastatic disease and she had that  obtained as indicated below.  She also had a CT of chest which showed pulmonary nodules both less than 1 cm (see official radiology below).  She was seen by surgery and is scheduled for Port-a-cath placement of 06/25/2013.   Today, she reports her bowel movements are regular and that she is with minimal pain.  She denies any fevers or chills.  She also reports that her weight is stable with an adequate appetite.  She is accompanied by her husband and daughter and sister.  She is very active and handles her basic, intermediate and advanced ADLs without difficulties.  MEDICAL HISTORY: Past Medical History  Diagnosis Date  . Colon cancer   . Anxiety     INTERIM HISTORY: has Rectal mass; Colon obstruction; Hypercalcemia; Colorectal cancer, stage IV; Family history of colorectal cancer; Thrush, oral; and Tachycardia on her problem list.    ALLERGIES:  is allergic to codeine.  MEDICATIONS: has a current medication list which includes the following prescription(s): cholecalciferol, cyanocobalamin, nystatin, polyethylene glycol, potassium, and hydrocodone-acetaminophen.  SURGICAL HISTORY:  Past Surgical History  Procedure Laterality Date  . Abdominal hysterectomy  2005  . Cesarean section  1976  . Flexible sigmoidoscopy N/A 05/25/2013    Procedure: FLEXIBLE SIGMOIDOSCOPY;  Surgeon: Rachael Fee, MD;  Location: [facility];  Service: Endoscopy;  Laterality: N/A;  needs floro  . Colonic stent placement N/A 05/25/2013    Procedure: COLONIC STENT PLACEMENT;  Surgeon: Rachael Fee, MD;  Location: [facility];  Service: Endoscopy;  Laterality: N/A;   REVIEW OF SYSTEMS:   Constitutional: Denies fevers,  chills or abnormal weight loss Eyes: Denies blurriness of vision Ears, nose, mouth, throat, and face: Denies mucositis or sore throat Respiratory: Denies cough, dyspnea or wheezes Cardiovascular: Denies palpitation, chest discomfort or lower extremity swelling Gastrointestinal:  Denies nausea,  heartburn or change in bowel habits Skin: Denies abnormal skin rashes Lymphatics: Denies new lymphadenopathy or easy bruising Neurological:Denies numbness, tingling or new weaknesses Behavioral/Psych: Mood is stable, no new changes  All other systems were reviewed with the patient and are negative.  PHYSICAL EXAMINATION: ECOG PERFORMANCE STATUS: 0 - Asymptomatic  Blood pressure 126/74, pulse 123, temperature 98 F (36.7 C), temperature source Oral, resp. rate 19, height 5\' 5"  (1.651 m), weight 143 lb 14.4 oz (65.273 kg).  GENERAL:alert, no distress and comfortable; well-developed, well nourished.  SKIN: skin color, texture, turgor are normal, no rashes or significant lesions EYES: normal, Conjunctiva are pink and non-injected, sclera clear OROPHARYNX:no exudate, no erythema and lips, buccal mucosa, and tongue with whitish strips NECK: supple, thyroid normal size, non-tender, without nodularity LYMPH:  no palpable lymphadenopathy in the cervical, axillary or supraclavicular LUNGS: clear to auscultation and percussion with normal breathing effort HEART: regular rate & rhythm and no murmurs and no lower extremity edema ABDOMEN:abdomen soft, non-tender and normal bowel sounds; + mild distention  Musculoskeletal:no cyanosis of digits and no clubbing  NEURO: alert & oriented x 3 with fluent speech, no focal motor/sensory deficits  LABORATORY DATA: Results for orders placed in visit on 06/20/13 (from the past 48 hour(s))  CBC WITH DIFFERENTIAL     Status: Abnormal   Collection Time    06/20/13 10:53 AM      Result Value Range   WBC 6.7  3.9 - 10.3 10e3/uL   NEUT# 4.3  1.5 - 6.5 10e3/uL   HGB 11.1 (*) 11.6 - 15.9 g/dL   HCT 16.1 (*) 09.6 - 04.5 %   Platelets 377  145 - 400 10e3/uL   MCV 83.2  79.5 - 101.0 fL   MCH 27.0  25.1 - 34.0 pg   MCHC 32.5  31.5 - 36.0 g/dL   RBC 4.09  8.11 - 9.14 10e6/uL   RDW 13.1  11.2 - 14.5 %   lymph# 1.5  0.9 - 3.3 10e3/uL   MONO# 0.4  0.1 - 0.9 10e3/uL    Eosinophils Absolute 0.4  0.0 - 0.5 10e3/uL   Basophils Absolute 0.0  0.0 - 0.1 10e3/uL   NEUT% 64.4  38.4 - 76.8 %   LYMPH% 22.9  14.0 - 49.7 %   MONO% 6.0  0.0 - 14.0 %   EOS% 6.0  0.0 - 7.0 %   BASO% 0.7  0.0 - 2.0 %  COMPREHENSIVE METABOLIC PANEL (CC13)     Status: Abnormal   Collection Time    06/20/13 10:53 AM      Result Value Range   Sodium 141  136 - 145 mEq/L   Potassium 4.4  3.5 - 5.1 mEq/L   Chloride 106  98 - 109 mEq/L   CO2 26  22 - 29 mEq/L   Glucose 106  70 - 140 mg/dl   BUN 78.2  7.0 - 95.6 mg/dL   Creatinine 0.8  0.6 - 1.1 mg/dL   Total Bilirubin 2.13  0.20 - 1.20 mg/dL   Alkaline Phosphatase 78  40 - 150 U/L   AST 14  5 - 34 U/L   ALT 11  0 - 55 U/L   Total Protein 8.2  6.4 - 8.3 g/dL  Albumin 3.0 (*) 3.5 - 5.0 g/dL   Calcium 24.4 (*) 8.4 - 10.4 mg/dL   Anion Gap 9  3 - 11 mEq/L    Labs:  Lab Results  Component Value Date   WBC 6.7 06/20/2013   HGB 11.1* 06/20/2013   HCT 34.1* 06/20/2013   MCV 83.2 06/20/2013   PLT 377 06/20/2013   NEUTROABS 4.3 06/20/2013      Chemistry      Component Value Date/Time   NA 141 06/20/2013 1053   NA 136 05/26/2013 0521   K 4.4 06/20/2013 1053   K 3.3* 05/26/2013 0521   CL 102 05/26/2013 0521   CO2 26 06/20/2013 1053   CO2 25 05/26/2013 0521   BUN 11.8 06/20/2013 1053   BUN 16 05/26/2013 0521   CREATININE 0.8 06/20/2013 1053   CREATININE 0.61 05/26/2013 0521      Component Value Date/Time   CALCIUM 12.6* 06/20/2013 1053   CALCIUM 10.8* 05/26/2013 0521   ALKPHOS 78 06/20/2013 1053   ALKPHOS 89 05/22/2013 0930   AST 14 06/20/2013 1053   AST 26 05/22/2013 0930   ALT 11 06/20/2013 1053   ALT 15 05/22/2013 0930   BILITOT 0.22 06/20/2013 1053   BILITOT 0.2* 05/22/2013 0930     Results for AIRYANNA, DIPALMA (MRN [id number]) as of 06/05/2013 16:08  Ref. Range 05/22/2013 09:30 05/23/2013 20:47 05/25/2013 04:33 05/26/2013 05:21 06/04/2013 11:13  Calcium Latest Range: 8.4-10.5 mg/dL 64.4 (H) 03.4 (H) 74.2 (H)  10.8 (H) 12.7 (H)   Basic Metabolic Panel:  Recent Labs Lab 06/20/13 1053  NA 141  K 4.4  CO2 26  GLUCOSE 106  BUN 11.8  CREATININE 0.8  CALCIUM 12.6*   GFR Estimated Creatinine Clearance: 66.5 ml/min (by C-G formula based on Cr of 0.8). Liver Function Tests:  Recent Labs Lab 06/20/13 1053  AST 14  ALT 11  ALKPHOS 78  BILITOT 0.22  PROT 8.2  ALBUMIN 3.0*   CBC:  Recent Labs Lab 06/20/13 1053  WBC 6.7  NEUTROABS 4.3  HGB 11.1*  HCT 34.1*  MCV 83.2  PLT 377   Microbiology No results found for this or any previous visit (from the past 240 hour(s)).   Ref. Range 05/26/2013 05:21  PTH Latest Range: 14.0-72.0 pg/mL 192.5 (H)     Ref. Range 05/24/2013 18:02  Calcium Ionized Latest Range: 1.13-1.30 mmol/L 1.62 (H)     Ref. Range 06/04/2013 11:13  CEA Latest Range: 0.0-5.0 ng/mL 140.5 (H)     Ref. Range 05/25/2013 16:06  Vit D, 25-Hydroxy Latest Range: 30-89 ng/mL 19 (L)   Studies:  No results found.   RADIOGRAPHIC STUDIES: CT CHEST WITH CONTRAST  TECHNIQUE: ultidetector CT imaging of the chest was performed during  intravenous contrast administration. CONTRAST: 80mL OMNIPAQUE IOHEXOL 300 MG/ML SOLN COMPARISON: 05/22/2013 FINDINGS:  Hypodense hepatic and splenic masses noted. Left kidney upper pole 2  mm nonobstructive calculus. Small epicardial lymph nodes noted. No pathologic thoracic adenopathy. Vascular structures unremarkable. 4 x 5 mm noncalcified solid nodule, left lower lobe, image 30 of series 5, stable from the CT scan of 05/22/2013. 2 mm right middle lobe nodule, image 31 of series 5. No other nodules observed. Mild thoracic spondylosis.  IMPRESSION: 1. 4 x 5 mm pulmonary nodule, left lower lobe, noncalcified. 2 mm right middle lobe pulmonary nodule. Although quite likely benign and too small for reliable PET-CT assessment or biopsy, these nodules merit attention on followup CT scans to exclude growth.  2. Scattered hepatic  and splenic hypodense  lesions as shown on recent abdomen workups. 3. Nonobstructive left nephrolithiasis.   Dg Pelvis 1-2 Views  05/25/2013   CLINICAL DATA:  Colon cancer.  Insertion of colonic stent.  EXAM: PELVIS - 1-2 VIEW  COMPARISON:  05/22/2013 CT.  Fluoroscopic time:  5 min and 30 seconds  FINDINGS: Four intraoperative C-arm views submitted for review after procedure. This reveals placement of a rectosigmoid stent with narrowing of the mid aspect of the stent. Contrast was instilled. On limited imaging, no extravasation detected.  IMPRESSION: Placement of a rectosigmoid stent with narrowing of the mid aspect of the stent.   Electronically Signed   By: Bridgett Larsson M.D.   On: 05/25/2013 10:55   Mr Abdomen W Wo Contrast  05/24/2013   CLINICAL DATA:  Abdominal pain, liver lesion on CT  EXAM: MRI ABDOMEN WITHOUT AND WITH CONTRAST  TECHNIQUE: Multiplanar multisequence MR imaging of the abdomen was performed both before and after the administration of intravenous contrast.  CONTRAST:  14mL MULTIHANCE GADOBENATE DIMEGLUMINE 529 MG/ML IV SOLN  COMPARISON:  CT abdomen pelvis dated 05/22/2013  FINDINGS: 5 mm pulmonary nodule in the left lower lobe (series 3/image 7).  2.7 x 3.9 cm lesion in the medial segment left hepatic lobe (series 1102/image 36), corresponding to the CT abnormality, demonstrates heterogeneous/peripheral enhancement, no intracellular lipid, and restricted diffusion (series 400/image 23). This appearance is suspicious for metastasis.  Two additional hepatic lesions with characteristic peripheral nodular discontinuous enhancement of benign hemangiomas, as follows:  --7.7 x 5.0 cm lesion in the right hepatic lobe (series 1102/ image 44)  --1.9 x 2.4 cm lesion in the posterior right hepatic dome (series 1102/image 25)  Multiple additional tiny T2 hyperintense lesions likely reflect small cysts or hemangiomas (for example, series 3/images 15, 17, 19, 20, and 22), but are too small to characterize.  3.7 x 4.4 cm  heterogeneously enhancing lesion along the superior spleen (series 13/ image 37), indeterminate, but worrisome for metastasis.  Pancreas and adrenal glands are within normal limits.  Gallbladder is unremarkable. No intrahepatic or extrahepatic ductal dilatation.  Kidneys are within normal limits.  No hydronephrosis.  No abdominal ascites.  No suspicious abdominal lymphadenopathy.  Multiple dilated loops of colon, possibly reflecting some degree of colonic obstruction related to known distal colonic mass on CT.  Enhancing T2 hyperintense lesion in the right T3 vertebral body (series 3/ image 37), favored to reflect a benign vertebral hemangioma.  IMPRESSION: 2.7 x 3.9 cm enhancing lesion in the medial segment left hepatic lobe, corresponding to the CT abnormality, suspicious for metastasis.  Two additional benign hemangiomas, as described above. Multiple additional tiny probable hepatic cysts or hemangiomas, too small to characterize.  3.7 x 4.4 cm enhancing lesion along the superior spleen, indeterminate but worrisome for metastasis.  Multiple dilated loops of colon, possibly reflecting some degree of colonic obstruction related to known distal colonic mass on CT.   Electronically Signed   By: Charline Bills M.D.   On: 05/24/2013 08:43   Ct Abdomen Pelvis W Contrast  05/24/2013   ADDENDUM REPORT: 05/24/2013 08:51  ADDENDUM: Addendum 05/24/2013 at 0849 hr  There is a 4.5 x 4 cm heterogeneously enhancing splenic mass which is well-circumscribed. When correlated with the MRI performed 05/24/2013 and the colonic findings, the appearance is concerning for metastatic disease.   Electronically Signed   By: Elige Ko   On: 05/24/2013 08:51   05/24/2013   CLINICAL DATA:  Generalized abdominal pain  EXAM: CT  ABDOMEN AND PELVIS WITH CONTRAST  TECHNIQUE: Multidetector CT imaging of the abdomen and pelvis was performed using the standard protocol following bolus administration of intravenous contrast.  CONTRAST:   80mL OMNIPAQUE IOHEXOL 300 MG/ML  SOLN  COMPARISON:  None.  FINDINGS: There is a 5.6 mm left lower lobe pulmonary nodule (image 6/series 3).  There is a low-attenuation area in the left hepatic lobe adjacent to the falciform ligament measuring 3 x 3.3 cm with vessels coursing through this area of low-attenuation suggesting an area of focal fatty deposition. There is a 8.2 x 5.1 cm hypodense mass in the right hepatic lobe with peripheral nodular enhancement with progressive filling in on delayed phase imaging most consistent with a hemangioma. There is a smaller similar mass measuring 2 x 1.3 cm in the right hepatic lobe near the dome of the liver with peripheral nodular enhancement. There is no intrahepatic or extrahepatic biliary ductal dilatation. The gallbladder is normal. The spleen demonstrates no focal abnormality. There are nonobstructing left renal calculi with the largest measuring 9 mm. The right kidney, adrenal glands and pancreas are normal. The bladder is unremarkable.  The stomach, duodenum, small intestine, and large intestine demonstrate no contrast extravasation or dilatation. There is a large amount of stool throughout the colon. There is a normal caliber appendix in the right lower quadrant without periappendiceal inflammatory changes.There is a relative area of bowel wall thickening with shouldering of the proximal and distal margins, involving the rectosigmoid junction with slight haziness along the right lateral aspect of the bowel wall. There is no pneumoperitoneum, pneumatosis, or portal venous gas. There is no abdominal or pelvic free fluid. There is no lymphadenopathy.  The abdominal aorta is normal in caliber with atherosclerosis.  There are no lytic or sclerotic osseous lesions.  IMPRESSION: 1. There is a relative area of bowel wall thickening with shouldering of the proximal and distal margins, involving the rectosigmoid junction with slight haziness along the right lateral aspect of the  bowel wall. The area is concerning for malignancy. Recommend further evaluation with colonoscopy.  There is a large amount of stool throughout the colon proximal to this area.  2. There are 2 hypodense right hepatic mass is with peripheral nodular enhancement most consistent with hemangiomas.  3.  Nonobstructing left renal calculi.  4. There is a low-attenuation area in the left hepatic lobe adjacent to the falciform ligament measuring 3 x 3.3 cm with vessels coursing through this area of low-attenuation suggesting an area of focal fatty deposition. If there is further clinical concern recommend an MRI of the abdomen without and with intravenous contrast.  Electronically Signed: By: Elige Ko On: 05/22/2013 10:20   Dg C-arm 61-120 Min-no Report  05/25/2013   CLINICAL DATA: bowel obstruction   C-ARM 61-120 MINUTES  Fluoroscopy was utilized by the requesting physician.  No radiographic  interpretation.    PATHOLOGY: Diagnosis Rectum, biopsy, proximal - INVASIVE ADENOCARCINOMA. Microscopic Comment Microsatellite instability testing can be performed upon request. (HCL:caf 05/28/13) [name] Miyamoto MD  Pathologist, Electronic Signature(Case signed 05/28/2013) Specimen(s) Obtained: Rectum, biopsy, proximal  Specimen Clinical Information stent colon mass stricture (kp)  Gross  Received in formalin are tan, soft tissue fragments that are submitted in toto. Number: five, Size: 0.1 to 0.5 cm, one block. (MM:gt, 05/25/13)  Diagnosis LIVER, FINE NEEDLE ASPIRATION, LEFT LOBE(SPECIMEN 1 OF 1 COLLECTED 06/12/13): MALIGNANT CELLS CONSISTENT WITH METASTATIC ADENOCARCINOMA OF COLONIC PRIMARY. Preliminary Diagnosis  ADEQUATE(HCL)  [name] Miyamoto MD Pathologist, Electronic Signature  (  Case signed 06/13/2013) Specimen Clinical Information  colon cancer, possible metastatic disease Source  Liver, Fine Needle Aspiration, left lobe (specimen 1 of 1 collected 06/12/13)  Gross  Specimen: Received is/are _ Prepared:1) 2  slides (1 quick stain) 2) 2 slides (1 quick stain) 3) 2 slides (1 quick stain). 30cc's of pink Cytolyt solution from needle rinses. (DB:BS:bs) # Smears: 6  # Concentration Technique Slides (i.e. ThinPrep): 1 # Cell Block: 1 Additional Studies: n/a  Mismatch Repair (MMR) Protein Immunohistochemistry (IHC) IHC Expression Result: MLH1: Preserved nuclear expression (greater 50% tumor expression) MSH2: Preserved nuclear expression (greater 50% tumor expression) MSH6: Preserved nuclear expression (greater 50% tumor expression) PMS2: Preserved nuclear expression (greater 50% tumor expression) * Internal control demonstrates intact nuclear expression Interpretation: NORMAL There is preserved expression of the major and minor MMR proteins. There is a very low probability that microsatellite instability (MSI) is present. However, certain clinically significant MMR protein mutations may result in preservation of nuclear expression. It is recommended that the preservation of protein expression be correlated with molecular based MSI testing. References: 1. Guidelines on Genetic Evaluation and Management of Lynch Syndrome: A Consensus Statement by the [place] Multi-Society Task Force on Colorectal Cancer Stana Bunting. Ileana Roup , MD, and others . Am Katheren Puller 2014; [phone]; doi: 10.1038/ajg.2014.186; published online 23 January 2013 2. Outcomes of screening endometrial cancer patients for Lynch syndrome by patient-administered checklist. Garner Nash MS, and others. Gynecol Oncol 2013;131(3):619-623. [name] Miyamoto MD Pathologist, Electronic Signature ( Signed 06/11/2013)  ASSESSMENT: Gillian Shields 62 y.o. female with a history of Metastatic colon cancer to liver - Plan: CBC with Differential, Comprehensive metabolic panel, CANCELED: IR Fluoro Guide CV Line Right  Hypercalcemia - Plan: Ambulatory referral to Endocrinology  Colorectal cancer, stage IV  Family history of colorectal  cancer   ASSESSMENT: 61 yo AAF with family history of colon cancer, admitted with obstructive mass s/p stent and biopsy on 11/21 concerning for metastatic colon cancer with mets to liver, spleen. ECOG 1. CEA of 225 down to 140.5 today.   PLAN:  1. 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   -- Last visit, we reviewed her recent imaging and pathology in detail.  Based on imaging, she is clinical stage IV.  We discussed that this less likely to be curable.  To complete staging, we obtained a CT of Chest to look for additional EOD. It revealed 4 x 5 mm pulmonary nodule, left lower lobe, noncalcified. 2 mm right middle lobe pulmonary nodule which requires follow-up to exclude increase in size.  In addition, we will set up for an ultrasound-guided biopsy to confirm metastatic disease which was done and consistent with colon adenocarcinoma.   Prior biology of rectum as noted above.  We discussed case with pathology (Dr. Frederica Kuster) who will perform MSI testing on primary tumor sample which was negative for MSI;  We have requested additional testing KRAS, BRAF and NRAS testing on ultrasound-guided biopsy sample (given a 85% concordance rate between primary tumor and metastatic site).    --We then reviewed her treatment options including clinical trial enrollment with [facility] 1317, a phase II study of irinotecan dosing in metastatic colorectal cancer (mCRC) patients receiving FOLFIRI + Bevacizumab (avastin); surgery followed by chemotherapy or vice/versa; chemotherapy alone, i.e., combinations to consider would be based on mutational status and functional status, i.e., FOLFOX.  However, given placement of stent and with discussion with surgery, she will not be a candidate for ava tatin.  Avastatin use in the  setting of stent placement lead to perforation risk between 15 - 50% according to some clinical trials.  Baseline rate of perforation without stent placement is 5 to 6%.   Given this risk,  she will not be a candidate for [facility].   We then discussed FOLFOX and FOLFIRI as initial chemotherapy treatments.     --There is no difference in overall response rate, time to progression and overall survival for patients treated with FOLFIRI versus FOLFOX4 in the treatment of advanced colorectal cancer.  Georjean Mode Clin Oncol, 2005, Aug 1; 13(08): 6578-46.  For FOLFIRI and FOLFOX respectively, the median to to progression was 7 months versus 7 months; duration of response was 9 versus 10 months; overall survival was 14 versus 15 months and overall response rates were 31% versus 34%. Per NCCN Guidelines 2.2015, we recommended FOLFOX for initial therapy for her colon cancer pending review of her KRAS/NRAS mutation status (if wild type, can add cetuximab or panitumumab).   Other potential therapies if progression is FOLFIRI, 5-FU/LV, CapeOx or capecitabine.   We plan to start FOLFOX on 07/02/2013 due to receipt of her port on 06/25/2013 and subsequent holiday schedule.  She was referred to chemotherapy teaching.  She was provided handouts on the side-effects and indications for this chemotherapy to review.   If offered consent on her next visit.  We discussed her favorable factors include her current functional status.  We will refer to nutrtional based upon her request to continue to optimize her nutritional status based on above.   2. Family history of Colon cancer. --Based on her age and family history (sister deceased at age 106 from colon cancer), referral for genetic testing for Lynch syndrome, i.e. MSI or IHC, would be appropriate. We made a referral to genetics. Her tumor was negative for lynch syndrome.  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  --Parathyroid hormone elevated 192.5 and parathyroid hormone-related peptide pending. PTH is generally low when hypercalcemia is secondary to malignancy. In primary PTH, it is elevated.  We counseled continue aggressive fluid hydration.   She was instructed to report to emergency room with worsening constipation or confusion.   We will likely check urinary calcium and refer to endocrinology for further evaluation.   4. Vitamin D deficency due poor intake of lack of sun exposure -- Vitamin D level 19.  We will replete on her next visit.   5. Follow-up. --Patient instructed to follow-up 2 weeks for labs and discussion of ultrasound-guided biopsy of the liver.   All questions were answered. The patient knows to call the clinic with any problems, questions or concerns. We can certainly see the patient much sooner if necessary.  I spent 25 minutes counseling the patient face to face. The total time spent in the appointment was 40 minutes.    Abdon Petrosky, MD 06/22/2013 7:14 AM

## 2013-06-22 NOTE — Telephone Encounter (Signed)
I called pt to discuss date for chemotherapy. Dr. Rosie Fate wanted pt to start 12/29 but due to the holidays we are not able to schedule on this date. We could treat pt on 06/27/13 if pt desires. She states that she gets her port on 06/25/13 and would like to spend Christmas with her family. I explained that we are going to schedule her to get her chemo 1/05 but if we have a cancellation the week of the 29th we will move her treatment up. Dr. Rosie Fate is aware and is ok with pt's decision.

## 2013-06-22 NOTE — Telephone Encounter (Signed)
I have tried to schedule appts per POF, but no available that week until 1/2. scheudler notified

## 2013-06-25 ENCOUNTER — Ambulatory Visit (HOSPITAL_BASED_OUTPATIENT_CLINIC_OR_DEPARTMENT_OTHER)
Admission: RE | Admit: 2013-06-25 | Discharge: 2013-06-25 | Disposition: A | Discharge status: Home / Self Care | Payer: Medicaid Other | Source: Ambulatory Visit | Attending: General Surgery | Admitting: General Surgery

## 2013-06-25 ENCOUNTER — Ambulatory Visit (HOSPITAL_COMMUNITY): Payer: Medicaid Other

## 2013-06-25 ENCOUNTER — Encounter (HOSPITAL_BASED_OUTPATIENT_CLINIC_OR_DEPARTMENT_OTHER): Payer: Medicaid Other | Admitting: Anesthesiology

## 2013-06-25 ENCOUNTER — Encounter (HOSPITAL_BASED_OUTPATIENT_CLINIC_OR_DEPARTMENT_OTHER): Payer: Self-pay

## 2013-06-25 ENCOUNTER — Encounter (HOSPITAL_BASED_OUTPATIENT_CLINIC_OR_DEPARTMENT_OTHER)
Admission: RE | Disposition: A | Discharge status: Home / Self Care | Payer: Self-pay | Source: Ambulatory Visit | Attending: General Surgery

## 2013-06-25 ENCOUNTER — Ambulatory Visit (HOSPITAL_BASED_OUTPATIENT_CLINIC_OR_DEPARTMENT_OTHER): Payer: Medicaid Other | Admitting: Anesthesiology

## 2013-06-25 DIAGNOSIS — C189 Malignant neoplasm of colon, unspecified: Secondary | ICD-10-CM

## 2013-06-25 DIAGNOSIS — C787 Secondary malignant neoplasm of liver and intrahepatic bile duct: Secondary | ICD-10-CM | POA: Insufficient documentation

## 2013-06-25 HISTORY — PX: PORTACATH PLACEMENT: SHX2246

## 2013-06-25 LAB — POCT HEMOGLOBIN-HEMACUE: Hemoglobin: 11.1 g/dL — ABNORMAL LOW (ref 12.0–15.0)

## 2013-06-25 SURGERY — INSERTION, TUNNELED CENTRAL VENOUS DEVICE, WITH PORT
Anesthesia: General | Site: Chest | Laterality: Right

## 2013-06-25 MED ORDER — PROMETHAZINE HCL 25 MG/ML IJ SOLN: 6.2500 mg | INTRAMUSCULAR | Status: DC | PRN

## 2013-06-25 MED ORDER — ACETAMINOPHEN 650 MG RE SUPP: 650.0000 mg | RECTAL | Status: DC | PRN

## 2013-06-25 MED ORDER — ACETAMINOPHEN 325 MG PO TABS: 650.0000 mg | ORAL_TABLET | ORAL | Status: DC | PRN

## 2013-06-25 MED ORDER — HYDROMORPHONE HCL PF 1 MG/ML IJ SOLN
0.2500 mg | INTRAMUSCULAR | Status: DC | PRN
  Administered 2013-06-25 (×2): 0.5 mg via INTRAVENOUS

## 2013-06-25 MED ORDER — ONDANSETRON HCL 4 MG/2ML IJ SOLN: 4.0000 mg | Freq: Four times a day (QID) | INTRAMUSCULAR | Status: DC | PRN

## 2013-06-25 MED ORDER — LIDOCAINE HCL (PF) 1 % IJ SOLN
INTRAMUSCULAR | Status: DC | PRN
  Administered 2013-06-25: 11 mL

## 2013-06-25 MED ORDER — HEPARIN (PORCINE) IN NACL 2-0.9 UNIT/ML-% IJ SOLN
INTRAMUSCULAR | Status: AC
  Filled 2013-06-25: qty 500

## 2013-06-25 MED ORDER — TRAMADOL HCL 50 MG PO TABS: 50.0000 mg | ORAL_TABLET | Freq: Four times a day (QID) | ORAL | Status: DC | PRN

## 2013-06-25 MED ORDER — LIDOCAINE HCL (CARDIAC) 20 MG/ML IV SOLN
INTRAVENOUS | Status: DC | PRN
  Administered 2013-06-25: 60 mg via INTRAVENOUS

## 2013-06-25 MED ORDER — OXYCODONE HCL 5 MG PO TABS
5.0000 mg | ORAL_TABLET | ORAL | Status: DC | PRN
Start: 2013-06-25 — End: 2013-06-25

## 2013-06-25 MED ORDER — FENTANYL CITRATE 0.05 MG/ML IJ SOLN: 50.0000 ug | INTRAMUSCULAR | Status: DC | PRN

## 2013-06-25 MED ORDER — PROPOFOL 10 MG/ML IV BOLUS
INTRAVENOUS | Status: DC | PRN
  Administered 2013-06-25: 120 mg via INTRAVENOUS

## 2013-06-25 MED ORDER — MIDAZOLAM HCL 2 MG/2ML IJ SOLN
INTRAMUSCULAR | Status: AC
  Filled 2013-06-25: qty 4

## 2013-06-25 MED ORDER — MIDAZOLAM HCL 5 MG/5ML IJ SOLN
INTRAMUSCULAR | Status: DC | PRN
  Administered 2013-06-25: 1.5 mg via INTRAVENOUS

## 2013-06-25 MED ORDER — FENTANYL CITRATE 0.05 MG/ML IJ SOLN
INTRAMUSCULAR | Status: DC | PRN
  Administered 2013-06-25 (×2): 50 ug via INTRAVENOUS

## 2013-06-25 MED ORDER — FENTANYL CITRATE 0.05 MG/ML IJ SOLN: 50.0000 ug | Freq: Once | INTRAMUSCULAR | Status: DC

## 2013-06-25 MED ORDER — HEPARIN SOD (PORK) LOCK FLUSH 100 UNIT/ML IV SOLN
INTRAVENOUS | Status: AC
  Filled 2013-06-25: qty 5

## 2013-06-25 MED ORDER — ONDANSETRON HCL 4 MG/2ML IJ SOLN
INTRAMUSCULAR | Status: DC | PRN
  Administered 2013-06-25: 4 mg via INTRAVENOUS

## 2013-06-25 MED ORDER — HEPARIN (PORCINE) IN NACL 2-0.9 UNIT/ML-% IJ SOLN
INTRAMUSCULAR | Status: DC | PRN
  Administered 2013-06-25: 500 mL via INTRAVENOUS

## 2013-06-25 MED ORDER — LACTATED RINGERS IV SOLN
INTRAVENOUS | Status: DC
  Administered 2013-06-25 (×2): via INTRAVENOUS

## 2013-06-25 MED ORDER — LIDOCAINE-EPINEPHRINE (PF) 1 %-1:200000 IJ SOLN
INTRAMUSCULAR | Status: AC
  Filled 2013-06-25: qty 10

## 2013-06-25 MED ORDER — HEPARIN SOD (PORK) LOCK FLUSH 100 UNIT/ML IV SOLN
INTRAVENOUS | Status: DC | PRN
  Administered 2013-06-25: 500 [IU] via INTRAVENOUS

## 2013-06-25 MED ORDER — PROPOFOL 10 MG/ML IV EMUL
INTRAVENOUS | Status: AC
  Filled 2013-06-25: qty 50

## 2013-06-25 MED ORDER — CEFAZOLIN SODIUM-DEXTROSE 2-3 GM-% IV SOLR
2.0000 g | INTRAVENOUS | Status: AC
  Administered 2013-06-25: 2 g via INTRAVENOUS

## 2013-06-25 MED ORDER — LIDOCAINE HCL (PF) 1 % IJ SOLN
INTRAMUSCULAR | Status: AC
  Filled 2013-06-25: qty 30

## 2013-06-25 MED ORDER — MIDAZOLAM HCL 2 MG/2ML IJ SOLN: 1.0000 mg | INTRAMUSCULAR | Status: DC | PRN

## 2013-06-25 MED ORDER — FENTANYL CITRATE 0.05 MG/ML IJ SOLN
INTRAMUSCULAR | Status: AC
  Filled 2013-06-25: qty 4

## 2013-06-25 MED ORDER — CEFAZOLIN SODIUM 1-5 GM-% IV SOLN
INTRAVENOUS | Status: AC
  Filled 2013-06-25: qty 100

## 2013-06-25 MED ORDER — DEXAMETHASONE SODIUM PHOSPHATE 4 MG/ML IJ SOLN
INTRAMUSCULAR | Status: DC | PRN
  Administered 2013-06-25: 10 mg via INTRAVENOUS

## 2013-06-25 MED ORDER — SODIUM CHLORIDE 0.9 % IJ SOLN: 3.0000 mL | INTRAMUSCULAR | Status: DC | PRN

## 2013-06-25 MED ORDER — HYDROMORPHONE HCL PF 1 MG/ML IJ SOLN
INTRAMUSCULAR | Status: AC
  Filled 2013-06-25: qty 1

## 2013-06-25 SURGICAL SUPPLY — 55 items
APL SKNCLS STERI-STRIP NONHPOA (GAUZE/BANDAGES/DRESSINGS) ×1
BAG DECANTER FOR FLEXI CONT (MISCELLANEOUS) ×2 IMPLANT
BENZOIN TINCTURE PRP APPL 2/3 (GAUZE/BANDAGES/DRESSINGS) ×2 IMPLANT
BLADE SURG 11 STRL SS (BLADE) ×2 IMPLANT
BLADE SURG 15 STRL LF DISP TIS (BLADE) IMPLANT
BLADE SURG 15 STRL SS (BLADE) ×2
BLADE SURG ROTATE 9660 (MISCELLANEOUS) IMPLANT
CANISTER SUCT 1200ML W/VALVE (MISCELLANEOUS) IMPLANT
CHLORAPREP W/TINT 26ML (MISCELLANEOUS) ×2 IMPLANT
CLEANER CAUTERY TIP 5X5 PAD (MISCELLANEOUS) ×1 IMPLANT
COVER MAYO STAND STRL (DRAPES) ×2 IMPLANT
COVER PROBE 5X48 (MISCELLANEOUS) ×2
COVER TABLE BACK 60X90 (DRAPES) ×2 IMPLANT
DECANTER SPIKE VIAL GLASS SM (MISCELLANEOUS) IMPLANT
DRAPE C-ARM 42X72 X-RAY (DRAPES) ×2 IMPLANT
DRAPE LAPAROTOMY TRNSV 102X78 (DRAPE) ×2 IMPLANT
DRAPE UTILITY XL STRL (DRAPES) ×2 IMPLANT
DRSG TEGADERM 2-3/8X2-3/4 SM (GAUZE/BANDAGES/DRESSINGS) ×1 IMPLANT
DRSG TEGADERM 4X4.75 (GAUZE/BANDAGES/DRESSINGS) ×2 IMPLANT
ELECT REM PT RETURN 9FT ADLT (ELECTROSURGICAL) ×2
ELECTRODE REM PT RTRN 9FT ADLT (ELECTROSURGICAL) ×1 IMPLANT
GAUZE SPONGE 4X4 16PLY XRAY LF (GAUZE/BANDAGES/DRESSINGS) IMPLANT
GLOVE BIOGEL PI IND STRL 7.0 (GLOVE) IMPLANT
GLOVE BIOGEL PI IND STRL 8.5 (GLOVE) ×1 IMPLANT
GLOVE BIOGEL PI INDICATOR 7.0 (GLOVE) ×1
GLOVE BIOGEL PI INDICATOR 8.5 (GLOVE) ×1
GLOVE ECLIPSE 6.5 STRL STRAW (GLOVE) ×1 IMPLANT
GLOVE ECLIPSE 8.0 STRL XLNG CF (GLOVE) ×2 IMPLANT
GOWN PREVENTION PLUS XLARGE (GOWN DISPOSABLE) ×3 IMPLANT
GOWN PREVENTION PLUS XXLARGE (GOWN DISPOSABLE) ×1 IMPLANT
IV CATH PLACEMENT UNIT 16 GA (IV SOLUTION) IMPLANT
IV KIT MINILOC 20X1 SAFETY (NEEDLE) IMPLANT
KIT CVR 48X5XPRB PLUP LF (MISCELLANEOUS) IMPLANT
KIT PORT POWER 8FR ISP CVUE (Catheter) ×1 IMPLANT
NDL HYPO 25X1 1.5 SAFETY (NEEDLE) ×1 IMPLANT
NEEDLE HYPO 22GX1.5 SAFETY (NEEDLE) IMPLANT
NEEDLE HYPO 25X1 1.5 SAFETY (NEEDLE) ×2 IMPLANT
PACK BASIN DAY SURGERY FS (CUSTOM PROCEDURE TRAY) ×2 IMPLANT
PAD CLEANER CAUTERY TIP 5X5 (MISCELLANEOUS)
PENCIL BUTTON HOLSTER BLD 10FT (ELECTRODE) ×2 IMPLANT
SET SHEATH INTRODUCER 10FR (MISCELLANEOUS) IMPLANT
SLEEVE SCD COMPRESS KNEE MED (MISCELLANEOUS) ×1 IMPLANT
SPONGE GAUZE 2X2 8PLY STRL LF (GAUZE/BANDAGES/DRESSINGS) ×3 IMPLANT
SPONGE GAUZE 4X4 12PLY STER LF (GAUZE/BANDAGES/DRESSINGS) ×2 IMPLANT
STRIP CLOSURE SKIN 1/2X4 (GAUZE/BANDAGES/DRESSINGS) ×2 IMPLANT
SUT MNCRL AB 4-0 PS2 18 (SUTURE) ×2 IMPLANT
SUT VIC AB 2-0 SH 18 (SUTURE) ×2 IMPLANT
SUT VIC AB 3-0 SH 27 (SUTURE) ×2
SUT VIC AB 3-0 SH 27X BRD (SUTURE) ×1 IMPLANT
SYR 5ML LUER SLIP (SYRINGE) ×2 IMPLANT
SYR CONTROL 10ML LL (SYRINGE) ×2 IMPLANT
TOWEL OR 17X24 6PK STRL BLUE (TOWEL DISPOSABLE) ×3 IMPLANT
TOWEL OR NON WOVEN STRL DISP B (DISPOSABLE) ×1 IMPLANT
TUBE CONNECTING 20X1/4 (TUBING) IMPLANT
YANKAUER SUCT BULB TIP NO VENT (SUCTIONS) IMPLANT

## 2013-06-25 NOTE — Op Note (Signed)
Preoperative diagnosis:  Stage IV colon cancer  Postoperative diagnosis:  Same  Procedure: Ultrasound-guided Port-A-Cath insertion into right internal jugular vein under fluoroscopy.  Surgeon: Avel Peace M.D.  Anesthesia: Local (Xylocaine) with General/LMA  Indication:  This is a 62 year old female diagnosed with Stage IV colon cancer.  She needs long-term venous access for chemotherapy.  Technique: She was brought to the operating room, placed supine on the operating table, and a general anesthetic was given. A roll was placed under the back and the arms were tucked.  The upper chest wall and neck were sterilely prepped and draped.  Her head was rotated to the left. Using the ultrasound, the right internal jugular vein was identified. Local anesthetic was infiltrated in the skin and subcutaneous tissue just anterior to it. A 16-gauge needle was used to cannulate the right internal jugular vein under ultrasound guidance. A wire was then threaded through the needle into the internal jugular vein and down into the right heart under ultrasound and fluoroscopic guidance.  Local anesthetic was infiltrated into the right upper chest wall. A right upper chest wall incision was made and a pocket was created for the Portacath.  An incision was made around the wire in the neck. The catheter was then tunneled from the chest wall incision up through the neck incision.  A dilator- introducer complex was placed over the wire into the superior vena cava. The dilator and wire were then removed and the catheter was threaded through the peel-away sheath introducer into the right heart. The introducer was then peeled away and removed. Under fluoroscopic guidance, the tip of the catheter was then pulled back until it was at the junction of the superior vena cava and right atrium. The catheter was then connected to the port.  The port aspirated blood and flushed easily.  The port was then anchored to the chest  wall with 2-0 Vicryl suture. Concentrated heparin solution was then placed into the port. The port and catheter position were then verified using fluoroscopy. The subcutaneous tissue was then closed over the port with running 3-0 Vicryl suture. The skin incisions were then closed with 4-0 Monocryl subcuticular stitches. Steri-Strips and sterile dressings were applied.  The procedure was well-tolerated without any apparent complications. She was taken to the recovery room in satisfactory condition where a portable chest x-ray is pending.

## 2013-06-25 NOTE — Anesthesia Preprocedure Evaluation (Signed)
Anesthesia Evaluation  Patient identified by MRN, date of birth, ID band Patient awake    Reviewed: Allergy & Precautions, H&P , NPO status , Patient's Chart, lab work & pertinent test results  Airway Mallampati: I TM Distance: >3 FB     Dental   Pulmonary former smoker,  breath sounds clear to auscultation        Cardiovascular Rhythm:Regular Rate:Tachycardia  Sinus tach   Neuro/Psych Anxiety    GI/Hepatic Colon ca   Endo/Other    Renal/GU      Musculoskeletal   Abdominal   Peds  Hematology   Anesthesia Other Findings   Reproductive/Obstetrics                           Anesthesia Physical Anesthesia Plan  ASA: III  Anesthesia Plan: General   Post-op Pain Management:    Induction: Intravenous  Airway Management Planned: LMA  Additional Equipment:   Intra-op Plan:   Post-operative Plan: Extubation in OR  Informed Consent: I have reviewed the patients History and Physical, chart, labs and discussed the procedure including the risks, benefits and alternatives for the proposed anesthesia with the patient or authorized representative who has indicated his/her understanding and acceptance.     Plan Discussed with: CRNA and Surgeon  Anesthesia Plan Comments:         Anesthesia Quick Evaluation

## 2013-06-25 NOTE — Transfer of Care (Signed)
Immediate Anesthesia Transfer of Care Note  Patient: [name] [name]  Procedure(s) Performed: Procedure(s): ULTRA SOUND GUIDED INSERTION PORT-A-CATH (Right)  Patient Location: PACU  Anesthesia Type:General  Level of Consciousness: awake, sedated and responds to stimulation  Airway & Oxygen Therapy: Patient Spontanous Breathing and Patient connected to face mask oxygen  Post-op Assessment: Report given to PACU RN, Post -op Vital signs reviewed and stable and Patient moving all extremities  Post vital signs: Reviewed and stable  Complications: No apparent anesthesia complications

## 2013-06-25 NOTE — Anesthesia Procedure Notes (Signed)
Procedure Name: LMA Insertion Date/Time: 06/25/2013 12:15 PM Performed by: Adolph Pollack Pre-anesthesia Checklist: Patient identified, Emergency Drugs available, Suction available and Patient being monitored Patient Re-evaluated:Patient Re-evaluated prior to inductionOxygen Delivery Method: Circle System Utilized Preoxygenation: Pre-oxygenation with 100% oxygen Intubation Type: IV induction Ventilation: Mask ventilation without difficulty LMA: LMA inserted LMA Size: 4.0 Number of attempts: 1 Airway Equipment and Method: bite block Placement Confirmation: positive ETCO2 and breath sounds checked- equal and bilateral Tube secured with: Tape Dental Injury: Teeth and Oropharynx as per pre-operative assessment

## 2013-06-25 NOTE — Interval H&P Note (Signed)
History and Physical Interval Note:  06/25/2013 11:57 AM  [name] [name]  has presented today for surgery, with the diagnosis of stage iv colon cancer  The various methods of treatment have been discussed with the patient and family. After consideration of risks, benefits and other options for treatment, the patient has consented to  Procedure(s): ULTRA SOUND GUIDED INSERTION PORT-A-CATH (N/A) as a surgical intervention .  The patient's history has been reviewed, patient examined, no change in status, stable for surgery.  I have reviewed the patient's chart and labs.  Questions were answered to the patient's satisfaction.     [name] [name]   

## 2013-06-25 NOTE — Anesthesia Postprocedure Evaluation (Signed)
  Anesthesia Post-op Note  Patient: [name] [name]  Procedure(s) Performed: Procedure(s): ULTRA SOUND GUIDED INSERTION PORT-A-CATH (Right)  Patient Location: PACU  Anesthesia Type:General  Level of Consciousness: awake  Airway and Oxygen Therapy: Patient Spontanous Breathing  Post-op Pain: mild  Post-op Assessment: Post-op Vital signs reviewed, Patient's Cardiovascular Status Stable, Respiratory Function Stable, Patent Airway, No signs of Nausea or vomiting and Pain level controlled  Post-op Vital Signs: Reviewed and stable  Complications: No apparent anesthesia complications

## 2013-06-25 NOTE — H&P (View-Only) (Signed)
Subjective:     Patient ID: [name] [name], female   DOB: March 09, 1951, 62 y.o.   MRN: [id number]  HPI  She is here for followup of her stage IV colon cancer. She went to the hospital with a near total large bowel structure. A. palliative stent was placed and she is much better. She is eating well. Bowels are moving. She had an image guided biopsy of the liver lesion that was positive for metastatic colorectal cancer. She is due to see the oncologist tomorrow.   Review of SystemsNo further abdominal pain. No nausea or vomiting. She does have a rash on her abdominal wall from scratching the area.     Objective:   Physical Exam Gen.-she looks well and is in no acute distress.  Abdomen-soft, nontender, nondistended, no palpable mass.  Lymph nodes-no palpable supraclavicular adenopathy, no periumbilical adenopathy.    Assessment:     Stage IV rectosigmoid colon cancer. Obstruction is relieved by stent.     Plan:     We'll place a Port-A-Cath if Dr. Rosie Fate thinks she needs 14 chemotherapy.   The procedure risks and aftercare been explained. Risks include but are not limited to bleeding, infection, malfunction, pneumothorax, wound problems, DVT.

## 2013-06-25 NOTE — Interval H&P Note (Signed)
History and Physical Interval Note:  06/25/2013 11:57 AM  [name] [name]  has presented today for surgery, with the diagnosis of stage iv colon cancer  The various methods of treatment have been discussed with the patient and family. After consideration of risks, benefits and other options for treatment, the patient has consented to  Procedure(s): ULTRA SOUND GUIDED INSERTION PORT-A-CATH (N/A) as a surgical intervention .  The patient's history has been reviewed, patient examined, no change in status, stable for surgery.  I have reviewed the patient's chart and labs.  Questions were answered to the patient's satisfaction.     [name] [name]

## 2013-06-25 NOTE — H&P (View-Only) (Signed)
Patient ID: [name] [name], female   DOB: 02/13/1951, 62 y.o.   MRN: [id number] I spoke with Dr. [name] (Medical Oncologist) about her. He wants to start her on chemotherapy and would like to have a Port-A-Cath placed. He was wanting to enroll her in a clinical trial using Avastin.  We discussed the fact that there is a significantly increased risk of perforation and people who have enteral stents and were given Avastin so I did not recommend it.  We will schedule her to have Port-A-Cath insertion. 

## 2013-06-26 ENCOUNTER — Encounter (HOSPITAL_BASED_OUTPATIENT_CLINIC_OR_DEPARTMENT_OTHER): Payer: Self-pay | Admitting: General Surgery

## 2013-06-26 ENCOUNTER — Inpatient Hospital Stay: Payer: Self-pay

## 2013-06-26 ENCOUNTER — Other Ambulatory Visit: Payer: Self-pay

## 2013-06-27 ENCOUNTER — Inpatient Hospital Stay: Payer: Self-pay

## 2013-07-02 ENCOUNTER — Telehealth: Payer: Self-pay | Admitting: *Deleted

## 2013-07-02 ENCOUNTER — Ambulatory Visit (HOSPITAL_BASED_OUTPATIENT_CLINIC_OR_DEPARTMENT_OTHER): Payer: Medicaid Other | Admitting: Internal Medicine

## 2013-07-02 ENCOUNTER — Other Ambulatory Visit: Payer: Self-pay | Admitting: Medical Oncology

## 2013-07-02 ENCOUNTER — Other Ambulatory Visit (HOSPITAL_BASED_OUTPATIENT_CLINIC_OR_DEPARTMENT_OTHER): Payer: Medicaid Other

## 2013-07-02 ENCOUNTER — Encounter: Payer: Self-pay | Admitting: Internal Medicine

## 2013-07-02 ENCOUNTER — Encounter (INDEPENDENT_AMBULATORY_CARE_PROVIDER_SITE_OTHER): Payer: Self-pay

## 2013-07-02 ENCOUNTER — Other Ambulatory Visit: Payer: Self-pay | Admitting: Internal Medicine

## 2013-07-02 VITALS — BP 132/82 | HR 118 | Temp 97.0°F | Resp 18 | Ht 65.5 in | Wt 144.4 lb

## 2013-07-02 DIAGNOSIS — C19 Malignant neoplasm of rectosigmoid junction: Secondary | ICD-10-CM

## 2013-07-02 DIAGNOSIS — C7889 Secondary malignant neoplasm of other digestive organs: Secondary | ICD-10-CM

## 2013-07-02 DIAGNOSIS — C787 Secondary malignant neoplasm of liver and intrahepatic bile duct: Secondary | ICD-10-CM

## 2013-07-02 DIAGNOSIS — K56609 Unspecified intestinal obstruction, unspecified as to partial versus complete obstruction: Secondary | ICD-10-CM

## 2013-07-02 DIAGNOSIS — C189 Malignant neoplasm of colon, unspecified: Secondary | ICD-10-CM

## 2013-07-02 DIAGNOSIS — Z800 Family history of malignant neoplasm of digestive organs: Secondary | ICD-10-CM

## 2013-07-02 DIAGNOSIS — E559 Vitamin D deficiency, unspecified: Secondary | ICD-10-CM

## 2013-07-02 LAB — CBC WITH DIFFERENTIAL/PLATELET
EOS%: 5.6 % (ref 0.0–7.0)
Eosinophils Absolute: 0.3 10*3/uL (ref 0.0–0.5)
MCH: 27.5 pg (ref 25.1–34.0)
MCV: 83.3 fL (ref 79.5–101.0)
MONO%: 7.2 % (ref 0.0–14.0)
NEUT#: 3.4 10*3/uL (ref 1.5–6.5)
RBC: 4.02 10*6/uL (ref 3.70–5.45)
RDW: 13.3 % (ref 11.2–14.5)
WBC: 5.7 10*3/uL (ref 3.9–10.3)
lymph#: 1.6 10*3/uL (ref 0.9–3.3)

## 2013-07-02 LAB — COMPREHENSIVE METABOLIC PANEL (CC13)
AST: 13 U/L (ref 5–34)
Albumin: 3.1 g/dL — ABNORMAL LOW (ref 3.5–5.0)
Alkaline Phosphatase: 72 U/L (ref 40–150)
Anion Gap: 9 mEq/L (ref 3–11)
CO2: 25 mEq/L (ref 22–29)
Calcium: 12.1 mg/dL — ABNORMAL HIGH (ref 8.4–10.4)
Creatinine: 0.8 mg/dL (ref 0.6–1.1)
Glucose: 117 mg/dl (ref 70–140)
Potassium: 4.3 mEq/L (ref 3.5–5.1)
Sodium: 139 mEq/L (ref 136–145)
Total Protein: 7.9 g/dL (ref 6.4–8.3)

## 2013-07-02 LAB — CEA: CEA: 272.3 ng/mL — ABNORMAL HIGH (ref 0.0–5.0)

## 2013-07-02 MED ORDER — ONDANSETRON HCL 8 MG PO TABS: 8.0000 mg | ORAL_TABLET | Freq: Two times a day (BID) | ORAL | Status: DC

## 2013-07-02 MED ORDER — DEXAMETHASONE 4 MG PO TABS: ORAL_TABLET | ORAL | Status: DC

## 2013-07-02 MED ORDER — LIDOCAINE-PRILOCAINE 2.5-2.5 % EX CREA: 1.0000 "application " | TOPICAL_CREAM | CUTANEOUS | Status: DC | PRN

## 2013-07-02 MED ORDER — PROCHLORPERAZINE MALEATE 10 MG PO TABS: 10.0000 mg | ORAL_TABLET | Freq: Four times a day (QID) | ORAL | Status: DC | PRN

## 2013-07-02 MED ORDER — LORAZEPAM 0.5 MG PO TABS: 0.5000 mg | ORAL_TABLET | Freq: Four times a day (QID) | ORAL | Status: DC | PRN

## 2013-07-02 NOTE — Telephone Encounter (Signed)
Per staff message and POF I have scheduled appts.  JMW  

## 2013-07-02 NOTE — Patient Instructions (Signed)
Colorectal Cancer Colorectal cancer is an abnormal growth of tissue (tumor) in the colon or rectum that is cancerous (malignant). Unlike noncancerous (benign) tumors, malignant tumors can spread to other parts of your body. The colon is the large bowel or large intestine. The rectum is the last several inches of the colon.  RISK FACTORS The exact cause of colorectal cancer is unknown. However, the following factors may increase your chances of getting colorectal cancer:   Age older than 24 years.   Abnormal growths (polyps) on the inner wall of the colon or rectum.   Diabetes.   African American race.   Family history of hereditary nonpolyposis colorectal cancer. This condition is caused by changes in the genes that are responsible for repairing mismatched DNA.   Personal history of cancer. A person who has already had colorectal cancer may develop it a second time. Also, women with a history of ovarian, uterine, or breast cancer are at a somewhat higher risk of developing colorectal cancer.  Certain hereditary conditions.  Eating a diet that is high in fat (especially animal fat) and low in fiber, fruits, and vegetables.  Sedentary lifestyle.  Inflammatory bowel disease, including ulcerative colitis and Crohn disease.   Smoking.   Excessive alcohol use.  SYMPTOMS Early colorectal cancer often does not cause symptoms. As the cancer grows, symptoms may include:   Changes in bowel habits.  Diarrhea.   Constipation.   Feeling like the bowel does not empty completely after a bowel movement.   Blood in the stool.   Stools that are narrower than usual.   Abdominal discomfort, pain, bloating, fullness, or cramps.  Frequent gas pain.   Unexplained weight loss.   Constant tiredness.   Nausea and vomiting.  DIAGNOSIS  Your health care provider will ask about your medical history. He or she may also perform a number of procedures, such as:   A physical  exam.  A digital rectal exam.  A fecal occult blood test.  A barium enema.  Blood tests.   X-rays.   Imaging tests, such as CT scans or MRIs.   Taking a tissue sample (biopsy) from your colon or rectum to look for cancer cells.   A sigmoidoscopy to view the inside of the last part of your colon.   A colonoscopy to view the inside of your entire colon.   An endorectal ultrasound to see how deep a rectal tumor has grown and whether the cancer has spread to lymph nodes or other nearby tissues.  Your cancer will be staged to determine its severity and extent. Staging is a careful attempt to find out the size of the tumor, whether the cancer has spread, and if so, to what parts of the body. You may need to have more tests to determine the stage of your cancer. The test results will help determine what treatment plan is best for you.   Stage 0 The cancer is found only in the innermost lining of the colon or rectum.   Stage I The cancer has grown into the inner wall of the colon or rectum. The cancer has not yet reached the outer wall of the colon.   Stage II The cancer extends more deeply into or through the wall of the colon or rectum. It may have invaded nearby tissue, but cancer cells have not spread to the lymph nodes.   Stage III The cancer has spread to nearby lymph nodes but not to other parts of the body.  Stage IV The cancer has spread to other parts of the body, such as the liver or lungs.  Your health care provider may tell you the detailed stage of your cancer, which includes both a number and a letter.  TREATMENT  Depending on the type and stage, colorectal cancer may be treated with surgery, radiation therapy, chemotherapy, targeted therapy, or radiofrequency ablation. Some people have a combination of these therapies. Surgery may be done to remove the polyps from your colon. In early stages, your health care provider may be able to do this during a  colonoscopy. In later stages, surgery may be done to remove part of your colon.  HOME CARE INSTRUCTIONS   Only take over-the-counter or prescription medicines for pain, discomfort, or fever as directed by your health care provider.   Maintain a healthy diet.   Consider joining a support group. This may help you learn to cope with the stress of having colorectal cancer.   Seek advice to help you manage treatment of side effects.   Keep all follow-up appointments as directed by your health care provider.   Inform your cancer specialist if you are admitted to the hospital.  SEEK MEDICAL CARE IF:  Your diarrhea or constipation does not go away.   Your bowel habits change.  You have increased abdominal pain.   You notice new fatigue or weakness.  You lose weight. Document Released: 06/21/2005 Document Revised: 02/21/2013 Document Reviewed: 12/14/2012 [facility] Patient Information 2014 [place], [place]. Metastatic Cancer, Questions and Answers KEY POINTS  Cancer happens when cells become abnormal and grow without control.  Where the cancer started is called the primary cancer or the primary tumor.  Metastatic cancer happens when cancer cells spread from the place where it started to other parts of the body.  When cancer spreads, the metastatic cancer keeps the same type of cells and the same name as the primary tumor.  The most common sites of metastasis are the lungs, bones, liver, and brain.  Treatment for metastatic cancer usually depends on the type of cancer. It also depends on the size and location of the metastasis. WHAT IS CANCER?   Cancer is a group of many related diseases. All cancers begin in cells. Cells are the building blocks that make up tissues. Cancer that arises from organs and solid tissues is called a solid tumor. Cancer that begins in blood cells is called leukemia, multiple myeloma, or lymphoma.  Normally, cells grow and divide to form new cells as  the body needs them. When cells grow old and die, new cells take their place. Sometimes this orderly process goes wrong. New cells form when the body does not need them. Old cells do not die when they should.  The extra cells form a mass of tissue. This is called a growth or tumor. Tumors can be either not cancerous (benign) or cancerous (malignant). Benign tumors do not spread to other parts of the body. They are rarely a threat to life. Malignant tumors can spread (metastasize) and may be life threatening. WHAT IS PRIMARY CANCER?  Cancer can begin in any organ or tissue of the body. The original tumor is called the primary cancer or primary tumor. It is usually named for the part of the body or the type of cell in which it begins. WHAT IS METASTASIS, AND HOW DOES IT HAPPEN?   Metastasis means the spread of cancer. Cancer cells can break away from a primary tumor and enter the bloodstream or lymphatic  system. This is the system that produces, stores, and carries the cells that fight infections. That is how cancer cells spread to other parts of the body.  When cancer cells spread and form a new tumor in a different organ, the new tumor is a metastatic tumor. The cells in the metastatic tumor come from the original tumor. For example, if breast cancer spreads to the lungs, the metastatic tumor in the lung is made up of cancerous breast cells. It is not made of lung cells. In this case, the disease in the lungs is metastatic breast cancer (not lung cancer). Under a microscope, metastatic breast cancer cells generally look the same as the cancer cells in the breast. WHERE DOES CANCER SPREAD?   Cancer cells can spread to almost any part of the body. Cancer cells frequently spread to lymph nodes (rounded masses of lymphatic tissue) near the primary tumor (regional lymph nodes). This is called lymph node involvement or regional disease. Cancer that spreads to other organs or to lymph nodes far from the primary  tumor is called metastatic disease. Caregivers sometimes also call this distant disease.  The most common sites of metastasis from solid tumors are the lungs, bones, liver, and brain. Some cancers tend to spread to certain parts of the body. For example, lung cancer often metastasizes to the brain or bones. Colon cancer often spreads to the liver. Prostate cancer tends to spread to the bones. Breast cancer commonly spreads to the bones, lungs, liver, or brain. But each of these cancers can spread to other parts of the body as well.  Because blood cells travel throughout the body, leukemia, multiple myeloma, and lymphoma cells are usually not localized when the cancer is diagnosed. Tumor cells may be found in the blood, several lymph nodes, or other parts of the body such as the liver or bones. This type of spread is not referred to as metastasis. ARE THERE SYMPTOMS OF METASTATIC CANCER?   Some people with metastatic cancer do not have symptoms. Their metastases are found by X-rays and other tests performed for other reasons.  When symptoms of metastatic cancer occur, the type and frequency of the symptoms will depend on the size and location of the metastasis. For example, cancer that spreads to the bones is likely to cause pain and can lead to bone fractures. Cancer that spreads to the brain can cause a variety of symptoms. These include headaches, seizures, and unsteadiness. Shortness of breath may be a sign of lung involvement. Abdominal swelling or yellowing of the skin (jaundice) can indicate that cancer has spread to the liver.  Sometimes a person's primary cancer is discovered only after the metastatic tumor causes symptoms. For example, a man whose prostate cancer has spread to the bones in his pelvis may have lower back pain (caused by the cancer in his bones) before he experiences any symptoms from the primary tumor in his prostate. HOW DOES THE CAREGIVER KNOW WHETHER A CANCER IS PRIMARY OR A  METASTATIC TUMOR?  To determine whether a tumor is primary or metastatic, the tumor will be examined under a microscope. In general, cancer cells look like abnormal versions of cells in the tissue where the cancer began. Using specialized diagnostic tests, a trained person is often able to tell where the cancer cells came from. Markers or antigens found in or on the cancer cells can indicate the primary site of the cancer.  Metastatic cancers may be found before or at the same time as  the primary tumor, or months or years later. When a new tumor is found in a patient who has been treated for cancer in the past, it is more often a metastasis than another primary tumor. IS IT POSSIBLE TO HAVE A METASTATIC TUMOR WITHOUT HAVING A PRIMARY CANCER?  No. A metastatic tumor always starts from cancer cells in another part of the body. In most cases, when a metastatic tumor is found first, the primary tumor can be found. The search for the primary tumor may involve lab tests, X-rays, and other procedures. However, in a small number of cases, a metastatic tumor is diagnosed but the primary tumor cannot be found, in spite of extensive tests. The tumor is metastatic because the cells are not like those in the organ or tissue in which the tumor is found. The primary tumor is called unknown or hidden (occult). The patient is said to have cancer of unknown primary origin (CUP). Because diagnostic techniques are constantly improving, the number of cases of CUP is going down.  WHAT TREATMENTS ARE USED FOR METASTATIC CANCER?   When cancer has metastasized, it may be treated with:  Chemotherapy.  Radiation therapy.  Biological therapy.  Hormone therapy.  Surgery.  Cryosurgery.  A combination of these.  The choice of treatment generally depends on the:  Type of primary cancer.  Size and location of the metastasis.  Patient's age and general health.  Types of treatments the patient has had in the past. In  patients with CUP, it is possible to treat the disease even though the primary tumor has not been located. The goal of treatment may be to control the cancer, or to relieve symptoms or side effects of treatment. ARE NEW TREATMENTS FOR METASTATIC CANCER BEING DEVELOPED?  Yes, many new cancer treatments are under study. To develop new treatments, the NCI sponsors clinical trials (research studies) with cancer patients in many hospitals, universities, medical schools, and cancer centers around the country. Clinical trials are a critical step in the improvement of treatment. Before any new treatment can be recommended for general use, doctors conduct studies to find out whether the treatment is both safe for patients and effective against the disease. The results of such studies have led to progress not only in the treatment of cancer, but in the detection, diagnosis, and prevention of the disease as well. Patients interested in taking part in a clinical trial should talk with their caregivers. FOR MORE INFORMATION National Cancer Institute (NCI): www.cancer.gov Document Released: 10/26/2004 Document Revised: 09/13/2011 Document Reviewed: 06/13/2008 [facility] Patient Information 2014 [place], [place]. Fluorouracil, 5-FU injection What is this medicine? FLUOROURACIL, 5-FU (flure oh YOOR a sil) is a chemotherapy drug. It slows the growth of cancer cells. This medicine is used to treat many types of cancer like breast cancer, colon or rectal cancer, pancreatic cancer, and stomach cancer. This medicine may be used for other purposes; ask your health care provider or pharmacist if you have questions. COMMON BRAND NAME(S): Adrucil What should I tell my health care provider before I take this medicine? They need to know if you have any of these conditions: -blood disorders -dihydropyrimidine dehydrogenase (DPD) deficiency -infection (especially a virus infection such as chickenpox, cold sores, or herpes) -kidney  disease -liver disease -malnourished, poor nutrition -recent or ongoing radiation therapy -an unusual or allergic reaction to fluorouracil, other chemotherapy, other medicines, foods, dyes, or preservatives -pregnant or trying to get pregnant -breast-feeding How should I use this medicine? This drug is given as  an infusion or injection into a vein. It is administered in a hospital or clinic by a specially trained health care professional. Talk to your pediatrician regarding the use of this medicine in children. Special care may be needed. Overdosage: If you think you have taken too much of this medicine contact a poison control center or emergency room at once. NOTE: This medicine is only for you. Do not share this medicine with others. What if I miss a dose? It is important not to miss your dose. Call your doctor or health care professional if you are unable to keep an appointment. What may interact with this medicine? -allopurinol -cimetidine -dapsone -digoxin -hydroxyurea -leucovorin -levamisole -medicines for seizures like ethotoin, fosphenytoin, phenytoin -medicines to increase blood counts like filgrastim, pegfilgrastim, sargramostim -medicines that treat or prevent blood clots like warfarin, enoxaparin, and dalteparin -methotrexate -metronidazole -pyrimethamine -some other chemotherapy drugs like busulfan, cisplatin, estramustine, vinblastine -trimethoprim -trimetrexate -vaccines Talk to your doctor or health care professional before taking any of these medicines: -acetaminophen -aspirin -ibuprofen -ketoprofen -naproxen This list may not describe all possible interactions. Give your health care provider a list of all the medicines, herbs, non-prescription drugs, or dietary supplements you use. Also tell them if you smoke, drink alcohol, or use illegal drugs. Some items may interact with your medicine. What should I watch for while using this medicine? Visit your doctor  for checks on your progress. This drug may make you feel generally unwell. This is not uncommon, as chemotherapy can affect healthy cells as well as cancer cells. Report any side effects. Continue your course of treatment even though you feel ill unless your doctor tells you to stop. In some cases, you may be given additional medicines to help with side effects. Follow all directions for their use. Call your doctor or health care professional for advice if you get a fever, chills or sore throat, or other symptoms of a cold or flu. Do not treat yourself. This drug decreases your body's ability to fight infections. Try to avoid being around people who are sick. This medicine may increase your risk to bruise or bleed. Call your doctor or health care professional if you notice any unusual bleeding. Be careful brushing and flossing your teeth or using a toothpick because you may get an infection or bleed more easily. If you have any dental work done, tell your dentist you are receiving this medicine. Avoid taking products that contain aspirin, acetaminophen, ibuprofen, naproxen, or ketoprofen unless instructed by your doctor. These medicines may hide a fever. Do not become pregnant while taking this medicine. Women should inform their doctor if they wish to become pregnant or think they might be pregnant. There is a potential for serious side effects to an unborn child. Talk to your health care professional or pharmacist for more information. Do not breast-feed an infant while taking this medicine. Men should inform their doctor if they wish to father a child. This medicine may lower sperm counts. Do not treat diarrhea with over the counter products. Contact your doctor if you have diarrhea that lasts more than 2 days or if it is severe and watery. This medicine can make you more sensitive to the sun. Keep out of the sun. If you cannot avoid being in the sun, wear protective clothing and use sunscreen. Do not  use sun lamps or tanning beds/booths. What side effects may I notice from receiving this medicine? Side effects that you should report to your doctor or health care  professional as soon as possible: -allergic reactions like skin rash, itching or hives, swelling of the face, lips, or tongue -low blood counts - this medicine may decrease the number of white blood cells, red blood cells and platelets. You may be at increased risk for infections and bleeding. -signs of infection - fever or chills, cough, sore throat, pain or difficulty passing urine -signs of decreased platelets or bleeding - bruising, pinpoint red spots on the skin, black, tarry stools, blood in the urine -signs of decreased red blood cells - unusually weak or tired, fainting spells, lightheadedness -breathing problems -changes in vision -chest pain -mouth sores -nausea and vomiting -pain, swelling, redness at site where injected -pain, tingling, numbness in the hands or feet -redness, swelling, or sores on hands or feet -stomach pain -unusual bleeding Side effects that usually do not require medical attention (report to your doctor or health care professional if they continue or are bothersome): -changes in finger or toe nails -diarrhea -dry or itchy skin -hair loss -headache -loss of appetite -sensitivity of eyes to the light -stomach upset -unusually teary eyes This list may not describe all possible side effects. Call your doctor for medical advice about side effects. You may report side effects to FDA at 1-800-FDA-1088. Where should I keep my medicine? This drug is given in a hospital or clinic and will not be stored at home. NOTE: This sheet is a summary. It may not cover all possible information. If you have questions about this medicine, talk to your doctor, pharmacist, or health care provider.  2014, [employer]. (2007-10-25 13:53:16) Oxaliplatin Injection What is this medicine? OXALIPLATIN (ox AL  i PLA tin) is a chemotherapy drug. It targets fast dividing cells, like cancer cells, and causes these cells to die. This medicine is used to treat cancers of the colon and rectum, and many other cancers. This medicine may be used for other purposes; ask your health care provider or pharmacist if you have questions. COMMON BRAND NAME(S): Eloxatin What should I tell my health care provider before I take this medicine? They need to know if you have any of these conditions: -kidney disease -an unusual or allergic reaction to oxaliplatin, other chemotherapy, other medicines, foods, dyes, or preservatives -pregnant or trying to get pregnant -breast-feeding How should I use this medicine? This drug is given as an infusion into a vein. It is administered in a hospital or clinic by a specially trained health care professional. Talk to your pediatrician regarding the use of this medicine in children. Special care may be needed. Overdosage: If you think you have taken too much of this medicine contact a poison control center or emergency room at once. NOTE: This medicine is only for you. Do not share this medicine with others. What if I miss a dose? It is important not to miss a dose. Call your doctor or health care professional if you are unable to keep an appointment. What may interact with this medicine? -medicines to increase blood counts like filgrastim, pegfilgrastim, sargramostim -probenecid -some antibiotics like amikacin, gentamicin, neomycin, polymyxin B, streptomycin, tobramycin -zalcitabine Talk to your doctor or health care professional before taking any of these medicines: -acetaminophen -aspirin -ibuprofen -ketoprofen -naproxen This list may not describe all possible interactions. Give your health care provider a list of all the medicines, herbs, non-prescription drugs, or dietary supplements you use. Also tell them if you smoke, drink alcohol, or use illegal drugs. Some items may  interact with your medicine. What should I  watch for while using this medicine? Your condition will be monitored carefully while you are receiving this medicine. You will need important blood work done while you are taking this medicine. This medicine can make you more sensitive to cold. Do not drink cold drinks or use ice. Cover exposed skin before coming in contact with cold temperatures or cold objects. When out in cold weather wear warm clothing and cover your mouth and nose to warm the air that goes into your lungs. Tell your doctor if you get sensitive to the cold. This drug may make you feel generally unwell. This is not uncommon, as chemotherapy can affect healthy cells as well as cancer cells. Report any side effects. Continue your course of treatment even though you feel ill unless your doctor tells you to stop. In some cases, you may be given additional medicines to help with side effects. Follow all directions for their use. Call your doctor or health care professional for advice if you get a fever, chills or sore throat, or other symptoms of a cold or flu. Do not treat yourself. This drug decreases your body's ability to fight infections. Try to avoid being around people who are sick. This medicine may increase your risk to bruise or bleed. Call your doctor or health care professional if you notice any unusual bleeding. Be careful brushing and flossing your teeth or using a toothpick because you may get an infection or bleed more easily. If you have any dental work done, tell your dentist you are receiving this medicine. Avoid taking products that contain aspirin, acetaminophen, ibuprofen, naproxen, or ketoprofen unless instructed by your doctor. These medicines may hide a fever. Do not become pregnant while taking this medicine. Women should inform their doctor if they wish to become pregnant or think they might be pregnant. There is a potential for serious side effects to an unborn child.  Talk to your health care professional or pharmacist for more information. Do not breast-feed an infant while taking this medicine. Call your doctor or health care professional if you get diarrhea. Do not treat yourself. What side effects may I notice from receiving this medicine? Side effects that you should report to your doctor or health care professional as soon as possible: -allergic reactions like skin rash, itching or hives, swelling of the face, lips, or tongue -low blood counts - This drug may decrease the number of white blood cells, red blood cells and platelets. You may be at increased risk for infections and bleeding. -signs of infection - fever or chills, cough, sore throat, pain or difficulty passing urine -signs of decreased platelets or bleeding - bruising, pinpoint red spots on the skin, black, tarry stools, nosebleeds -signs of decreased red blood cells - unusually weak or tired, fainting spells, lightheadedness -breathing problems -chest pain, pressure -cough -diarrhea -jaw tightness -mouth sores -nausea and vomiting -pain, swelling, redness or irritation at the injection site -pain, tingling, numbness in the hands or feet -problems with balance, talking, walking -redness, blistering, peeling or loosening of the skin, including inside the mouth -trouble passing urine or change in the amount of urine Side effects that usually do not require medical attention (report to your doctor or health care professional if they continue or are bothersome): -changes in vision -constipation -hair loss -loss of appetite -metallic taste in the mouth or changes in taste -stomach pain This list may not describe all possible side effects. Call your doctor for medical advice about side effects. You may report  side effects to FDA at 1-800-FDA-1088. Where should I keep my medicine? This drug is given in a hospital or clinic and will not be stored at home. NOTE: This sheet is a summary. It  may not cover all possible information. If you have questions about this medicine, talk to your doctor, pharmacist, or health care provider.  2014, [employer]. (2008-01-16 17:22:47)

## 2013-07-02 NOTE — Telephone Encounter (Signed)
appts made and printed. Pt is aware that tx will be added. i emailed MW to add the tx...td 

## 2013-07-02 NOTE — Progress Notes (Signed)
[facility] OFFICE PROGRESS NOTE  No PCP Per Patient [address] [place] [address]  DIAGNOSIS: Colorectal cancer, stage IV - Plan: CBC with Differential, CEA, Comprehensive metabolic panel (Cmet) - [facility], Lactate dehydrogenase (LDH) - [facility], dexamethasone (DECADRON) 4 MG tablet, ondansetron (ZOFRAN) 8 MG tablet, prochlorperazine (COMPAZINE) 10 MG tablet, LORazepam (ATIVAN) 0.5 MG tablet  Family history of colorectal cancer  Hypercalcemia  Colon obstruction  Chief Complaint  Patient presents with  . Metastatic colon cancer to liver   CURRENT THERAPY: Biopsy only of primary site; Scheduled for FOLFOX on 07/09/2013.   INTERVAL HISTORY: [name] [name] 62 y.o. female with a history of newly diagnosed colorectal cancer (adenocarcinoma)  metastatic to liver/spleen who presented to the [facility] with abdominal distention  for 2 days on 05/23/2013.   On her original presentation,  she denied any hematochezia or melana  but reported intermittent blood in stool over the past several months. She also reported 3 episodes of loose bowel movements per day. She had an CT of her abdomen done revealing an area of bowel wall thickening around the rectosigmoid junction concerning for malignancy. In addition, lesions were noted over the left lobe of the liver. [facility] were consulted. She had colonoscopy with stent placement and biopsy on 05/25/2013. In addition, she had a MRI of the abdomen confirming above and there was also a 4.5 x 4 cm splenic mass, a 3 x 3.3 cm liver mass left lobe, suggestive of a metastatic cancer. She was found to have an elevated CEA of 225. Her pathology revealed invasive adenocarcinoma.  Of note, her history is notable for her sister with colon cancer who died at age 68. She had not had a screening colonoscopy. She reports a 10 lb weight lost over the past several months.   She was last seen by me on 06/20/2013. She  completed a liver biopsy which confirmed metastatic disease and she had that obtained as indicated below.  She also had a CT of chest which showed pulmonary nodules both less than 1 cm (see official radiology below).  She was seen by surgery and had a Port-a-cath placement of 06/25/2013.    Today, she reports her bowel movements are regular and that she is with minimal pain.  She denies any fevers or chills.  She also reports that her weight is stable with an adequate appetite.  She is accompanied by her husband and and sister.  She is very active and handles her basic, intermediate and advanced ADLs without difficulties. She had a great Christmas holiday.   MEDICAL HISTORY: Past Medical History  Diagnosis Date  . Colon cancer   . Anxiety     INTERIM HISTORY: has Rectal mass; Colon obstruction; Hypercalcemia; Colorectal cancer, stage IV; Family history of colorectal cancer; Thrush, oral; and Tachycardia on her problem list.    ALLERGIES:  is allergic to codeine.  MEDICATIONS: has a current medication list which includes the following prescription(s): cyanocobalamin, polyethylene glycol, tramadol, dexamethasone, lidocaine-prilocaine, lorazepam, ondansetron, and prochlorperazine.  SURGICAL HISTORY:  Past Surgical History  Procedure Laterality Date  . Abdominal hysterectomy  2005  . Cesarean section  1976  . Flexible sigmoidoscopy N/A 05/25/2013    Procedure: FLEXIBLE SIGMOIDOSCOPY;  Surgeon: Rachael Fee, MD;  Location: [facility];  Service: Endoscopy;  Laterality: N/A;  needs floro  . Colonic stent placement N/A 05/25/2013    Procedure: COLONIC STENT PLACEMENT;  Surgeon: Rachael Fee, MD;  Location: [facility];  Service: Endoscopy;  Laterality: N/A;  . Portacath placement Right 06/25/2013    Procedure: ULTRA SOUND GUIDED INSERTION PORT-A-CATH;  Surgeon: Adolph Pollack, MD;  Location: [facility];  Service: General;  Laterality: Right;   REVIEW OF SYSTEMS:    Constitutional: Denies fevers, chills or abnormal weight loss Eyes: Denies blurriness of vision Ears, nose, mouth, throat, and face: Denies mucositis or sore throat Respiratory: Denies cough, dyspnea or wheezes Cardiovascular: Denies palpitation, chest discomfort or lower extremity swelling Gastrointestinal:  Denies nausea, heartburn or change in bowel habits Skin: Denies abnormal skin rashes Lymphatics: Denies new lymphadenopathy or easy bruising Neurological:Denies numbness, tingling or new weaknesses Behavioral/Psych: Mood is stable, no new changes  All other systems were reviewed with the patient and are negative.  PHYSICAL EXAMINATION: ECOG PERFORMANCE STATUS: 0 - Asymptomatic  Blood pressure 132/82, pulse 118, temperature 97 F (36.1 C), temperature source Oral, resp. rate 18, height 5' 5.5" (1.664 m), weight 144 lb 6.4 oz (65.499 kg).  GENERAL:alert, no distress and comfortable; well-developed, well nourished.  SKIN: skin color, texture, turgor are normal, no rashes or significant lesions EYES: normal, Conjunctiva are pink and non-injected, sclera clear OROPHARYNX:no exudate, no erythema and lips, buccal mucosa, and tongue with whitish strips NECK: supple, thyroid normal size, non-tender, without nodularity LYMPH:  no palpable lymphadenopathy in the cervical, axillary or supraclavicular LUNGS: clear to auscultation and percussion with normal breathing effort HEART: regular rate & rhythm and no murmurs and no lower extremity edema ABDOMEN:abdomen soft, non-tender and normal bowel sounds; + mild distention  Musculoskeletal:no cyanosis of digits and no clubbing  NEURO: alert & oriented x 3 with fluent speech, no focal motor/sensory deficits  LABORATORY DATA: Results for orders placed in visit on 07/02/13 (from the past 48 hour(s))  CEA     Status: Abnormal   Collection Time    07/02/13  9:54 AM      Result Value Range   CEA 272.3 (*) 0.0 - 5.0 ng/mL   Comment: Result  repeated and verified.Result confirmed by automatic dilution.  CBC WITH DIFFERENTIAL     Status: Abnormal   Collection Time    07/02/13  9:54 AM      Result Value Range   WBC 5.7  3.9 - 10.3 10e3/uL   NEUT# 3.4  1.5 - 6.5 10e3/uL   HGB 11.0 (*) 11.6 - 15.9 g/dL   HCT 30.8 (*) 65.7 - 84.6 %   Platelets 328  145 - 400 10e3/uL   MCV 83.3  79.5 - 101.0 fL   MCH 27.5  25.1 - 34.0 pg   MCHC 33.0  31.5 - 36.0 g/dL   RBC 9.62  9.52 - 8.41 10e6/uL   RDW 13.3  11.2 - 14.5 %   lymph# 1.6  0.9 - 3.3 10e3/uL   MONO# 0.4  0.1 - 0.9 10e3/uL   Eosinophils Absolute 0.3  0.0 - 0.5 10e3/uL   Basophils Absolute 0.0  0.0 - 0.1 10e3/uL   NEUT% 59.2  38.4 - 76.8 %   LYMPH% 27.3  14.0 - 49.7 %   MONO% 7.2  0.0 - 14.0 %   EOS% 5.6  0.0 - 7.0 %   BASO% 0.7  0.0 - 2.0 %  COMPREHENSIVE METABOLIC PANEL (CC13)     Status: Abnormal   Collection Time    07/02/13  9:55 AM      Result Value Range   Sodium 139  136 - 145 mEq/L   Potassium  4.3  3.5 - 5.1 mEq/L   Chloride 106  98 - 109 mEq/L   CO2 25  22 - 29 mEq/L   Glucose 117  70 - 140 mg/dl   BUN 40.9  7.0 - 81.1 mg/dL   Creatinine 0.8  0.6 - 1.1 mg/dL   Total Bilirubin 9.14  0.20 - 1.20 mg/dL   Alkaline Phosphatase 72  40 - 150 U/L   AST 13  5 - 34 U/L   ALT 12  0 - 55 U/L   Total Protein 7.9  6.4 - 8.3 g/dL   Albumin 3.1 (*) 3.5 - 5.0 g/dL   Calcium 78.2 (*) 8.4 - 10.4 mg/dL   Anion Gap 9  3 - 11 mEq/L    Labs:  Lab Results  Component Value Date   WBC 5.7 07/02/2013   HGB 11.0* 07/02/2013   HCT 33.4* 07/02/2013   MCV 83.3 07/02/2013   PLT 328 07/02/2013   NEUTROABS 3.4 07/02/2013      Chemistry      Component Value Date/Time   NA 139 07/02/2013 0955   NA 136 05/26/2013 0521   K 4.3 07/02/2013 0955   K 3.3* 05/26/2013 0521   CL 102 05/26/2013 0521   CO2 25 07/02/2013 0955   CO2 25 05/26/2013 0521   BUN 12.8 07/02/2013 0955   BUN 16 05/26/2013 0521   CREATININE 0.8 07/02/2013 0955   CREATININE 0.61 05/26/2013 0521      Component  Value Date/Time   CALCIUM 12.1* 07/02/2013 0955   CALCIUM 10.8* 05/26/2013 0521   ALKPHOS 72 07/02/2013 0955   ALKPHOS 89 05/22/2013 0930   AST 13 07/02/2013 0955   AST 26 05/22/2013 0930   ALT 12 07/02/2013 0955   ALT 15 05/22/2013 0930   BILITOT 0.21 07/02/2013 0955   BILITOT 0.2* 05/22/2013 0930     Results for LATRELL, REITAN (MRN [id number]) as of 06/05/2013 16:08  Ref. Range 05/22/2013 09:30 05/23/2013 20:47 05/25/2013 04:33 05/26/2013 05:21 06/04/2013 11:13  Calcium Latest Range: 8.4-10.5 mg/dL 57.8 (H) 46.9 (H) 62.9 (H) 10.8 (H) 12.7 (H)   Basic Metabolic Panel:  Recent Labs Lab 07/02/13 0955  NA 139  K 4.3  CO2 25  GLUCOSE 117  BUN 12.8  CREATININE 0.8  CALCIUM 12.1*   GFR Estimated Creatinine Clearance: 67.8 ml/min (by C-G formula based on Cr of 0.8). Liver Function Tests:  Recent Labs Lab 07/02/13 0955  AST 13  ALT 12  ALKPHOS 72  BILITOT 0.21  PROT 7.9  ALBUMIN 3.1*   CBC:  Recent Labs Lab 07/02/13 0954  WBC 5.7  NEUTROABS 3.4  HGB 11.0*  HCT 33.4*  MCV 83.3  PLT 328   Microbiology No results found for this or any previous visit (from the past 240 hour(s)).   Ref. Range 05/26/2013 05:21  PTH Latest Range: 14.0-72.0 pg/mL 192.5 (H)     Ref. Range 05/24/2013 18:02  Calcium Ionized Latest Range: 1.13-1.30 mmol/L 1.62 (H)     Ref. Range 06/04/2013 11:13  CEA Latest Range: 0.0-5.0 ng/mL 140.5 (H)     Ref. Range 05/25/2013 16:06  Vit D, 25-Hydroxy Latest Range: 30-89 ng/mL 19 (L)   Studies:  No results found.   RADIOGRAPHIC STUDIES: CT CHEST WITH CONTRAST  TECHNIQUE: ultidetector CT imaging of the chest was performed during  intravenous contrast administration. CONTRAST: 80mL OMNIPAQUE IOHEXOL 300 MG/ML SOLN COMPARISON: 05/22/2013 FINDINGS:  Hypodense hepatic and splenic masses noted. Left kidney upper pole 2  mm nonobstructive  calculus. Small epicardial lymph nodes noted. No pathologic thoracic adenopathy. Vascular structures  unremarkable. 4 x 5 mm noncalcified solid nodule, left lower lobe, image 30 of series 5, stable from the CT scan of 05/22/2013. 2 mm right middle lobe nodule, image 31 of series 5. No other nodules observed. Mild thoracic spondylosis.  IMPRESSION: 1. 4 x 5 mm pulmonary nodule, left lower lobe, noncalcified. 2 mm right middle lobe pulmonary nodule. Although quite likely benign and too small for reliable PET-CT assessment or biopsy, these nodules merit attention on followup CT scans to exclude growth.  2. Scattered hepatic and splenic hypodense lesions as shown on recent abdomen workups. 3. Nonobstructive left nephrolithiasis.   Dg Pelvis 1-2 Views  05/25/2013   CLINICAL DATA:  Colon cancer.  Insertion of colonic stent.  EXAM: PELVIS - 1-2 VIEW  COMPARISON:  05/22/2013 CT.  Fluoroscopic time:  5 min and 30 seconds  FINDINGS: Four intraoperative C-arm views submitted for review after procedure. This reveals placement of a rectosigmoid stent with narrowing of the mid aspect of the stent. Contrast was instilled. On limited imaging, no extravasation detected.  IMPRESSION: Placement of a rectosigmoid stent with narrowing of the mid aspect of the stent.   Electronically Signed   By: Bridgett Larsson M.D.   On: 05/25/2013 10:55   Mr Abdomen W Wo Contrast  05/24/2013   CLINICAL DATA:  Abdominal pain, liver lesion on CT  EXAM: MRI ABDOMEN WITHOUT AND WITH CONTRAST  TECHNIQUE: Multiplanar multisequence MR imaging of the abdomen was performed both before and after the administration of intravenous contrast.  CONTRAST:  14mL MULTIHANCE GADOBENATE DIMEGLUMINE 529 MG/ML IV SOLN  COMPARISON:  CT abdomen pelvis dated 05/22/2013  FINDINGS: 5 mm pulmonary nodule in the left lower lobe (series 3/image 7).  2.7 x 3.9 cm lesion in the medial segment left hepatic lobe (series 1102/image 36), corresponding to the CT abnormality, demonstrates heterogeneous/peripheral enhancement, no intracellular lipid, and restricted diffusion (series  400/image 23). This appearance is suspicious for metastasis.  Two additional hepatic lesions with characteristic peripheral nodular discontinuous enhancement of benign hemangiomas, as follows:  --7.7 x 5.0 cm lesion in the right hepatic lobe (series 1102/ image 44)  --1.9 x 2.4 cm lesion in the posterior right hepatic dome (series 1102/image 25)  Multiple additional tiny T2 hyperintense lesions likely reflect small cysts or hemangiomas (for example, series 3/images 15, 17, 19, 20, and 22), but are too small to characterize.  3.7 x 4.4 cm heterogeneously enhancing lesion along the superior spleen (series 13/ image 37), indeterminate, but worrisome for metastasis.  Pancreas and adrenal glands are within normal limits.  Gallbladder is unremarkable. No intrahepatic or extrahepatic ductal dilatation.  Kidneys are within normal limits.  No hydronephrosis.  No abdominal ascites.  No suspicious abdominal lymphadenopathy.  Multiple dilated loops of colon, possibly reflecting some degree of colonic obstruction related to known distal colonic mass on CT.  Enhancing T2 hyperintense lesion in the right T3 vertebral body (series 3/ image 37), favored to reflect a benign vertebral hemangioma.  IMPRESSION: 2.7 x 3.9 cm enhancing lesion in the medial segment left hepatic lobe, corresponding to the CT abnormality, suspicious for metastasis.  Two additional benign hemangiomas, as described above. Multiple additional tiny probable hepatic cysts or hemangiomas, too small to characterize.  3.7 x 4.4 cm enhancing lesion along the superior spleen, indeterminate but worrisome for metastasis.  Multiple dilated loops of colon, possibly reflecting some degree of colonic obstruction related to known distal colonic mass on  CT.   Electronically Signed   By: Charline Bills M.D.   On: 05/24/2013 08:43   Ct Abdomen Pelvis W Contrast  05/24/2013   ADDENDUM REPORT: 05/24/2013 08:51  ADDENDUM: Addendum 05/24/2013 at 0849 hr  There is a 4.5 x 4  cm heterogeneously enhancing splenic mass which is well-circumscribed. When correlated with the MRI performed 05/24/2013 and the colonic findings, the appearance is concerning for metastatic disease.   Electronically Signed   By: Elige Ko   On: 05/24/2013 08:51   05/24/2013   CLINICAL DATA:  Generalized abdominal pain  EXAM: CT ABDOMEN AND PELVIS WITH CONTRAST  TECHNIQUE: Multidetector CT imaging of the abdomen and pelvis was performed using the standard protocol following bolus administration of intravenous contrast.  CONTRAST:  80mL OMNIPAQUE IOHEXOL 300 MG/ML  SOLN  COMPARISON:  None.  FINDINGS: There is a 5.6 mm left lower lobe pulmonary nodule (image 6/series 3).  There is a low-attenuation area in the left hepatic lobe adjacent to the falciform ligament measuring 3 x 3.3 cm with vessels coursing through this area of low-attenuation suggesting an area of focal fatty deposition. There is a 8.2 x 5.1 cm hypodense mass in the right hepatic lobe with peripheral nodular enhancement with progressive filling in on delayed phase imaging most consistent with a hemangioma. There is a smaller similar mass measuring 2 x 1.3 cm in the right hepatic lobe near the dome of the liver with peripheral nodular enhancement. There is no intrahepatic or extrahepatic biliary ductal dilatation. The gallbladder is normal. The spleen demonstrates no focal abnormality. There are nonobstructing left renal calculi with the largest measuring 9 mm. The right kidney, adrenal glands and pancreas are normal. The bladder is unremarkable.  The stomach, duodenum, small intestine, and large intestine demonstrate no contrast extravasation or dilatation. There is a large amount of stool throughout the colon. There is a normal caliber appendix in the right lower quadrant without periappendiceal inflammatory changes.There is a relative area of bowel wall thickening with shouldering of the proximal and distal margins, involving the rectosigmoid  junction with slight haziness along the right lateral aspect of the bowel wall. There is no pneumoperitoneum, pneumatosis, or portal venous gas. There is no abdominal or pelvic free fluid. There is no lymphadenopathy.  The abdominal aorta is normal in caliber with atherosclerosis.  There are no lytic or sclerotic osseous lesions.  IMPRESSION: 1. There is a relative area of bowel wall thickening with shouldering of the proximal and distal margins, involving the rectosigmoid junction with slight haziness along the right lateral aspect of the bowel wall. The area is concerning for malignancy. Recommend further evaluation with colonoscopy.  There is a large amount of stool throughout the colon proximal to this area.  2. There are 2 hypodense right hepatic mass is with peripheral nodular enhancement most consistent with hemangiomas.  3.  Nonobstructing left renal calculi.  4. There is a low-attenuation area in the left hepatic lobe adjacent to the falciform ligament measuring 3 x 3.3 cm with vessels coursing through this area of low-attenuation suggesting an area of focal fatty deposition. If there is further clinical concern recommend an MRI of the abdomen without and with intravenous contrast.  Electronically Signed: By: Elige Ko On: 05/22/2013 10:20   Dg C-arm 61-120 Min-no Report  05/25/2013   CLINICAL DATA: bowel obstruction   C-ARM 61-120 MINUTES  Fluoroscopy was utilized by the requesting physician.  No radiographic  interpretation.    PATHOLOGY: Diagnosis Rectum, biopsy, proximal -  INVASIVE ADENOCARCINOMA. Microscopic Comment Microsatellite instability testing can be performed upon request. (HCL:caf 05/28/13) Abigail Miyamoto MD  Pathologist, Electronic Signature(Case signed 05/28/2013) Specimen(s) Obtained: Rectum, biopsy, proximal  Specimen Clinical Information stent colon mass stricture (kp)  Gross  Received in formalin are tan, soft tissue fragments that are submitted in toto. Number: five, Size:  0.1 to 0.5 cm, one block. (MM:gt, 05/25/13)  Diagnosis LIVER, FINE NEEDLE ASPIRATION, LEFT LOBE(SPECIMEN 1 OF 1 COLLECTED 06/12/13): MALIGNANT CELLS CONSISTENT WITH METASTATIC ADENOCARCINOMA OF COLONIC PRIMARY. Preliminary Diagnosis  ADEQUATE(HCL)  Abigail Miyamoto MD Pathologist, Electronic Signature  (Case signed 06/13/2013) Specimen Clinical Information  colon cancer, possible metastatic disease Source  Liver, Fine Needle Aspiration, left lobe (specimen 1 of 1 collected 06/12/13)  Gross  Specimen: Received is/are _ Prepared:1) 2 slides (1 quick stain) 2) 2 slides (1 quick stain) 3) 2 slides (1 quick stain). 30cc's of pink Cytolyt solution from needle rinses. (DB:BS:bs) # Smears: 6  # Concentration Technique Slides (i.e. ThinPrep): 1 # Cell Block: 1 Additional Studies: n/a  Mismatch Repair (MMR) Protein Immunohistochemistry (IHC) IHC Expression Result: MLH1: Preserved nuclear expression (greater 50% tumor expression) MSH2: Preserved nuclear expression (greater 50% tumor expression) MSH6: Preserved nuclear expression (greater 50% tumor expression) PMS2: Preserved nuclear expression (greater 50% tumor expression) * Internal control demonstrates intact nuclear expression Interpretation: NORMAL There is preserved expression of the major and minor MMR proteins. There is a very low probability that microsatellite instability (MSI) is present. However, certain clinically significant MMR protein mutations may result in preservation of nuclear expression. It is recommended that the preservation of protein expression be correlated with molecular based MSI testing. References: 1. Guidelines on Genetic Evaluation and Management of Lynch Syndrome: A Consensus Statement by the [place] Multi-Society Task Force on Colorectal Cancer Stana Bunting. Ileana Roup , MD, and others . Am Katheren Puller 2014; [phone]; doi: 10.1038/ajg.2014.186; published online 23 January 2013 2. Outcomes of screening endometrial cancer  patients for Lynch syndrome by patient-administered checklist. Garner Nash MS, and others. Gynecol Oncol 2013;131(3):619-623. Gwendolyn Grant LI MD Pathologist, Electronic Signature ( Signed 06/11/2013)  ASSESSMENT: [name] [name] 62 y.o. female with a history of Colorectal cancer, stage IV - Plan: CBC with Differential, CEA, Comprehensive metabolic panel (Cmet) - [facility], Lactate dehydrogenase (LDH) - [facility], dexamethasone (DECADRON) 4 MG tablet, ondansetron (ZOFRAN) 8 MG tablet, prochlorperazine (COMPAZINE) 10 MG tablet, LORazepam (ATIVAN) 0.5 MG tablet  Family history of colorectal cancer  Hypercalcemia  Colon obstruction   ASSESSMENT: 62 yo AAF with family history of colon cancer, admitted with obstructive mass s/p stent and biopsy on 11/21 consistent with metastatic colon cancer with mets to liver, spleen. ECOG 0. Elevated CEA.  PLAN:  1. 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   -- Last visit, we reviewed her recent imaging and pathology in detail.  Based on imaging and pathology by liver biopsy, she is stage IV.  We discussed that this less likely to be curable.  To complete staging, we obtained a CT of Chest to look for additional EOD. It revealed 4 x 5 mm pulmonary nodule, left lower lobe, noncalcified. 2 mm right middle lobe pulmonary nodule which requires follow-up to exclude increase in size.  In addition, we will set up for an ultrasound-guided biopsy to confirm metastatic disease which was done and consistent with colon adenocarcinoma.   Prior biology of rectum as noted above.  We discussed case with pathology (Dr. Frederica Kuster) who performed MSI testing on primary tumor sample which was negative  for MSI;  We have requested additional testing KRAS, BRAF and NRAS testing on ultrasound-guided biopsy sample (given a 85% concordance rate between primary tumor and metastatic site).  We will add cetuximab or panitumumab if KRAS/NRAS wild type only. (per NCCN guidelines).    --On prior visit, we  reviewed her treatment options including clinical trial enrollment with [facility] 1317, a phase II study of irinotecan dosing in metastatic colorectal cancer (mCRC) patients receiving FOLFIRI + Bevacizumab (avastin); surgery followed by chemotherapy or vice/versa; chemotherapy alone, i.e., combinations to consider would be based on mutational status and functional status, i.e., FOLFOX.  However, given placement of stent and with discussion with surgery, she will not be a candidate for avastatin.  Avastatin use in the setting of stent placement lead to perforation risk between 15 - 50% according to some clinical trials.  Baseline rate of perforation without stent placement is 5 to 6%.   Given this substantially increased risk of perforation, she will not be a candidate for [facility].   We then discussed FOLFOX and FOLFIRI as initial chemotherapy treatments.     --There is no difference in overall response rate, time to progression and overall survival for patients treated with FOLFIRI versus FOLFOX4 in the treatment of advanced colorectal cancer.  Georjean Mode Clin Oncol, 2005, Aug 1; 40(98): 1191-47.  For FOLFIRI and FOLFOX respectively, the median to to progression was 7 months versus 7 months; duration of response was 9 versus 10 months; overall survival was 14 versus 15 months and overall response rates were 31% versus 34%. Per NCCN Guidelines 2.2015, we recommended FOLFOX for initial therapy for her colon cancer pending review of her KRAS/NRAS mutation status (if wild type, can add cetuximab or panitumumab as noted above).   Other potential therapies if progression is FOLFIRI, 5-FU/LV, CapeOx or capecitabine.   We plan to start modified FOLFOX  6 q 14 day until progression or unacceptable toxicity starting on 07/09/2012 (Day #1) due to receipt of her port on 06/25/2013 and subsequent holiday schedule.  It will consists of the following:     Day # 1 Oxaliplatin 85 mg/m2 for 2  hours                                      Leucovorin 400 mg/m2 for 2 hours before     5-FU d1     Fluorouracil 400 mg/m2 once starting 2.5     After treatment start time   Days #1/2  Fluorouracil 2,400 mg/m2 for 46 hours   Day #3  Pump D/C  The reference is from Hochster, HS et al. Safety and efficacy of oxaliplatin and fluoropyrimidine regimens with or without bevacizumab as first-line treatment of metastatic colorectal cancer: results of the TREE Study. J Clin Oncol 2008; 82:9562.  In addition, we provided an in depth discussion concerning the indications and side-effects of therapy.  Side-effects includes but is not limited to bone marrow suppression (i.e., anemia, thrombocytopenia or leukopenia which can lead to life threatening infections), nausea or vomiting, gastrointestinal effects including diarrhea and electrolyte imbalances, neurotoxicity mainly from oxaliplatinum, liver and/or kidney dysfunction.  We will not provide neulasta unless she has febrile neutropenia as risk of febrile neutropenia is less than 5%.  We will follow labs prior to chemotherapy.  She understood the indications, benefits and risk of therapy and chose to proceed.  She was referred to chemotherapy  teaching and will complete this tomorrow.  She was provided handouts on the side-effects and indications for this chemotherapy to review.  We discussed her favorable factors include her current functional status.  We will refer to nutrtional based upon her request to continue to optimize her nutritional status based on above.   2. Family history of Colon cancer. --Based on her age and family history (sister deceased at age 74 from colon cancer), referral for genetic testing for Lynch syndrome, i.e. MSI or IHC, would be appropriate. We made a referral to genetics. Her tumor was negative for lynch syndrome.  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  --Parathyroid hormone elevated 192.5 and parathyroid hormone-related  peptide pending. PTH is generally low when hypercalcemia is secondary to malignancy. In primary PTH, it is elevated.  We counseled continue aggressive fluid hydration.  She was instructed to report to emergency room with worsening constipation or confusion.   We will likely check urinary calcium and refer to endocrinology for further evaluation.   4. Vitamin D deficency due poor intake of lack of sun exposure -- Vitamin D level 19.  We will replete on her next visit.   5. Follow-up. --Patient instructed to follow-up 3 weeks for labs and consideration of next chemotherapy cycle.   All questions were answered. The patient knows to call the clinic with any problems, questions or concerns. We can certainly see the patient much sooner if necessary.  I spent 15 minutes counseling the patient face to face. The total time spent in the appointment was 25 minutes.    Tyreisha Ungar, MD 07/02/2013 8:21 PM

## 2013-07-03 ENCOUNTER — Other Ambulatory Visit: Payer: Medicaid Other

## 2013-07-03 ENCOUNTER — Encounter: Payer: Self-pay | Admitting: *Deleted

## 2013-07-09 ENCOUNTER — Other Ambulatory Visit: Payer: Self-pay | Admitting: Internal Medicine

## 2013-07-09 ENCOUNTER — Encounter (INDEPENDENT_AMBULATORY_CARE_PROVIDER_SITE_OTHER): Payer: Self-pay

## 2013-07-09 ENCOUNTER — Ambulatory Visit (HOSPITAL_BASED_OUTPATIENT_CLINIC_OR_DEPARTMENT_OTHER): Payer: Medicaid Other

## 2013-07-09 VITALS — BP 143/66 | HR 121 | Temp 98.9°F | Resp 20

## 2013-07-09 DIAGNOSIS — C7889 Secondary malignant neoplasm of other digestive organs: Secondary | ICD-10-CM

## 2013-07-09 DIAGNOSIS — C19 Malignant neoplasm of rectosigmoid junction: Secondary | ICD-10-CM

## 2013-07-09 DIAGNOSIS — C787 Secondary malignant neoplasm of liver and intrahepatic bile duct: Secondary | ICD-10-CM

## 2013-07-09 DIAGNOSIS — Z5111 Encounter for antineoplastic chemotherapy: Secondary | ICD-10-CM

## 2013-07-09 MED ORDER — OXALIPLATIN CHEMO INJECTION 100 MG/20ML
85.0000 mg/m2 | Freq: Once | INTRAVENOUS | Status: AC
  Administered 2013-07-09: 150 mg via INTRAVENOUS
  Filled 2013-07-09: qty 30

## 2013-07-09 MED ORDER — SODIUM CHLORIDE 0.9 % IJ SOLN
10.0000 mL | INTRAMUSCULAR | Status: DC | PRN
  Filled 2013-07-09: qty 10

## 2013-07-09 MED ORDER — LEUCOVORIN CALCIUM INJECTION 350 MG
402.0000 mg/m2 | Freq: Once | INTRAVENOUS | Status: AC
  Administered 2013-07-09: 700 mg via INTRAVENOUS
  Filled 2013-07-09: qty 35

## 2013-07-09 MED ORDER — ONDANSETRON 8 MG/NS 50 ML IVPB
INTRAVENOUS | Status: AC
  Filled 2013-07-09: qty 8

## 2013-07-09 MED ORDER — DEXTROSE 5 % IV SOLN
Freq: Once | INTRAVENOUS | Status: AC
  Administered 2013-07-09: 13:00:00 via INTRAVENOUS

## 2013-07-09 MED ORDER — HEPARIN SOD (PORK) LOCK FLUSH 100 UNIT/ML IV SOLN
500.0000 [IU] | Freq: Once | INTRAVENOUS | Status: DC | PRN
  Filled 2013-07-09: qty 5

## 2013-07-09 MED ORDER — DEXAMETHASONE SODIUM PHOSPHATE 10 MG/ML IJ SOLN
10.0000 mg | Freq: Once | INTRAMUSCULAR | Status: AC
  Administered 2013-07-09: 10 mg via INTRAVENOUS

## 2013-07-09 MED ORDER — DEXAMETHASONE SODIUM PHOSPHATE 10 MG/ML IJ SOLN
INTRAMUSCULAR | Status: AC
  Filled 2013-07-09: qty 1

## 2013-07-09 MED ORDER — ONDANSETRON 8 MG/50ML IVPB (CHCC)
8.0000 mg | Freq: Once | INTRAVENOUS | Status: AC
  Administered 2013-07-09: 8 mg via INTRAVENOUS

## 2013-07-09 MED ORDER — FLUOROURACIL CHEMO INJECTION 2.5 GM/50ML
400.0000 mg/m2 | Freq: Once | INTRAVENOUS | Status: AC
  Administered 2013-07-09: 700 mg via INTRAVENOUS
  Filled 2013-07-09: qty 14

## 2013-07-09 MED ORDER — SODIUM CHLORIDE 0.9 % IV SOLN
2400.0000 mg/m2 | INTRAVENOUS | Status: DC
  Administered 2013-07-09: 4200 mg via INTRAVENOUS
  Filled 2013-07-09: qty 84

## 2013-07-09 NOTE — Patient Instructions (Signed)
[facility] Discharge Instructions for Patients Receiving Chemotherapy  Today you received the following chemotherapy agents Oxaliplatin, Leucovorin, Fluorouracil  To help prevent nausea and vomiting after your treatment, we encourage you to take your nausea medication Zofran   If you develop nausea and vomiting that is not controlled by your nausea medication, call the clinic.   BELOW ARE SYMPTOMS THAT SHOULD BE REPORTED IMMEDIATELY:  *FEVER GREATER THAN 100.5 F  *CHILLS WITH OR WITHOUT FEVER  NAUSEA AND VOMITING THAT IS NOT CONTROLLED WITH YOUR NAUSEA MEDICATION  *UNUSUAL SHORTNESS OF BREATH  *UNUSUAL BRUISING OR BLEEDING  TENDERNESS IN MOUTH AND THROAT WITH OR WITHOUT PRESENCE OF ULCERS  *URINARY PROBLEMS  *BOWEL PROBLEMS  UNUSUAL RASH Items with * indicate a potential emergency and should be followed up as soon as possible.  Feel free to call the clinic you have any questions or concerns. The clinic phone number is [phone].  Fluorouracil, 5-FU injection What is this medicine? FLUOROURACIL, 5-FU (flure oh YOOR a sil) is a chemotherapy drug. It slows the growth of cancer cells. This medicine is used to treat many types of cancer like breast cancer, colon or rectal cancer, pancreatic cancer, and stomach cancer. This medicine may be used for other purposes; ask your health care provider or pharmacist if you have questions. COMMON BRAND NAME(S): Adrucil What should I tell my health care provider before I take this medicine? They need to know if you have any of these conditions: -blood disorders -dihydropyrimidine dehydrogenase (DPD) deficiency -infection (especially a virus infection such as chickenpox, cold sores, or herpes) -kidney disease -liver disease -malnourished, poor nutrition -recent or ongoing radiation therapy -an unusual or allergic reaction to fluorouracil, other chemotherapy, other medicines, foods, dyes, or preservatives -pregnant or  trying to get pregnant -breast-feeding How should I use this medicine? This drug is given as an infusion or injection into a vein. It is administered in a hospital or clinic by a specially trained health care professional. Talk to your pediatrician regarding the use of this medicine in children. Special care may be needed. Overdosage: If you think you have taken too much of this medicine contact a poison control center or emergency room at once. NOTE: This medicine is only for you. Do not share this medicine with others. What if I miss a dose? It is important not to miss your dose. Call your doctor or health care professional if you are unable to keep an appointment. What may interact with this medicine? -allopurinol -cimetidine -dapsone -digoxin -hydroxyurea -leucovorin -levamisole -medicines for seizures like ethotoin, fosphenytoin, phenytoin -medicines to increase blood counts like filgrastim, pegfilgrastim, sargramostim -medicines that treat or prevent blood clots like warfarin, enoxaparin, and dalteparin -methotrexate -metronidazole -pyrimethamine -some other chemotherapy drugs like busulfan, cisplatin, estramustine, vinblastine -trimethoprim -trimetrexate -vaccines Talk to your doctor or health care professional before taking any of these medicines: -acetaminophen -aspirin -ibuprofen -ketoprofen -naproxen This list may not describe all possible interactions. Give your health care provider a list of all the medicines, herbs, non-prescription drugs, or dietary supplements you use. Also tell them if you smoke, drink alcohol, or use illegal drugs. Some items may interact with your medicine. What should I watch for while using this medicine? Visit your doctor for checks on your progress. This drug may make you feel generally unwell. This is not uncommon, as chemotherapy can affect healthy cells as well as cancer cells. Report any side effects. Continue your course of treatment  even though you feel ill unless  your doctor tells you to stop. In some cases, you may be given additional medicines to help with side effects. Follow all directions for their use. Call your doctor or health care professional for advice if you get a fever, chills or sore throat, or other symptoms of a cold or flu. Do not treat yourself. This drug decreases your body's ability to fight infections. Try to avoid being around people who are sick. This medicine may increase your risk to bruise or bleed. Call your doctor or health care professional if you notice any unusual bleeding. Be careful brushing and flossing your teeth or using a toothpick because you may get an infection or bleed more easily. If you have any dental work done, tell your dentist you are receiving this medicine. Avoid taking products that contain aspirin, acetaminophen, ibuprofen, naproxen, or ketoprofen unless instructed by your doctor. These medicines may hide a fever. Do not become pregnant while taking this medicine. Women should inform their doctor if they wish to become pregnant or think they might be pregnant. There is a potential for serious side effects to an unborn child. Talk to your health care professional or pharmacist for more information. Do not breast-feed an infant while taking this medicine. Men should inform their doctor if they wish to father a child. This medicine may lower sperm counts. Do not treat diarrhea with over the counter products. Contact your doctor if you have diarrhea that lasts more than 2 days or if it is severe and watery. This medicine can make you more sensitive to the sun. Keep out of the sun. If you cannot avoid being in the sun, wear protective clothing and use sunscreen. Do not use sun lamps or tanning beds/booths. What side effects may I notice from receiving this medicine? Side effects that you should report to your doctor or health care professional as soon as possible: -allergic reactions  like skin rash, itching or hives, swelling of the face, lips, or tongue -low blood counts - this medicine may decrease the number of white blood cells, red blood cells and platelets. You may be at increased risk for infections and bleeding. -signs of infection - fever or chills, cough, sore throat, pain or difficulty passing urine -signs of decreased platelets or bleeding - bruising, pinpoint red spots on the skin, black, tarry stools, blood in the urine -signs of decreased red blood cells - unusually weak or tired, fainting spells, lightheadedness -breathing problems -changes in vision -chest pain -mouth sores -nausea and vomiting -pain, swelling, redness at site where injected -pain, tingling, numbness in the hands or feet -redness, swelling, or sores on hands or feet -stomach pain -unusual bleeding Side effects that usually do not require medical attention (report to your doctor or health care professional if they continue or are bothersome): -changes in finger or toe nails -diarrhea -dry or itchy skin -hair loss -headache -loss of appetite -sensitivity of eyes to the light -stomach upset -unusually teary eyes This list may not describe all possible side effects. Call your doctor for medical advice about side effects. You may report side effects to FDA at 1-800-FDA-1088. Where should I keep my medicine? This drug is given in a hospital or clinic and will not be stored at home. NOTE: This sheet is a summary. It may not cover all possible information. If you have questions about this medicine, talk to your doctor, pharmacist, or health care provider.  2014, Elsevier/Gold Standard. (2007-10-25 13:53:16)  Leucovorin injection What is this medicine? LEUCOVORIN (  loo koe VOR in) is used to prevent or treat the harmful effects of some medicines. This medicine is used to treat anemia caused by a low amount of folic acid in the body. It is also used with 5-fluorouracil (5-FU) to treat  colon cancer. This medicine may be used for other purposes; ask your health care provider or pharmacist if you have questions. What should I tell my health care provider before I take this medicine? They need to know if you have any of these conditions: -anemia from low levels of vitamin B-12 in the blood -an unusual or allergic reaction to leucovorin, folic acid, other medicines, foods, dyes, or preservatives -pregnant or trying to get pregnant -breast-feeding How should I use this medicine? This medicine is for injection into a muscle or into a vein. It is given by a health care professional in a hospital or clinic setting. Talk to your pediatrician regarding the use of this medicine in children. Special care may be needed. Overdosage: If you think you have taken too much of this medicine contact a poison control center or emergency room at once. NOTE: This medicine is only for you. Do not share this medicine with others. What if I miss a dose? This does not apply. What may interact with this medicine? -capecitabine -fluorouracil -phenobarbital -phenytoin -primidone -trimethoprim-sulfamethoxazole This list may not describe all possible interactions. Give your health care provider a list of all the medicines, herbs, non-prescription drugs, or dietary supplements you use. Also tell them if you smoke, drink alcohol, or use illegal drugs. Some items may interact with your medicine. What should I watch for while using this medicine? Your condition will be monitored carefully while you are receiving this medicine. This medicine may increase the side effects of 5-fluorouracil, 5-FU. Tell your doctor or health care professional if you have diarrhea or mouth sores that do not get better or that get worse. What side effects may I notice from receiving this medicine? Side effects that you should report to your doctor or health care professional as soon as possible: -allergic reactions like skin  rash, itching or hives, swelling of the face, lips, or tongue -breathing problems -fever, infection -mouth sores -unusual bleeding or bruising -unusually weak or tired Side effects that usually do not require medical attention (report to your doctor or health care professional if they continue or are bothersome): -constipation or diarrhea -loss of appetite -nausea, vomiting This list may not describe all possible side effects. Call your doctor for medical advice about side effects. You may report side effects to FDA at 1-800-FDA-1088. Where should I keep my medicine? This drug is given in a hospital or clinic and will not be stored at home. NOTE: This sheet is a summary. It may not cover all possible information. If you have questions about this medicine, talk to your doctor, pharmacist, or health care provider.  2014, Elsevier/Gold Standard. (2007-12-26 16:50:29)   Oxaliplatin Injection What is this medicine? OXALIPLATIN (ox AL i PLA tin) is a chemotherapy drug. It targets fast dividing cells, like cancer cells, and causes these cells to die. This medicine is used to treat cancers of the colon and rectum, and many other cancers. This medicine may be used for other purposes; ask your health care provider or pharmacist if you have questions. COMMON BRAND NAME(S): Eloxatin What should I tell my health care provider before I take this medicine? They need to know if you have any of these conditions: -kidney disease -an unusual or  allergic reaction to oxaliplatin, other chemotherapy, other medicines, foods, dyes, or preservatives -pregnant or trying to get pregnant -breast-feeding How should I use this medicine? This drug is given as an infusion into a vein. It is administered in a hospital or clinic by a specially trained health care professional. Talk to your pediatrician regarding the use of this medicine in children. Special care may be needed. Overdosage: If you think you have taken  too much of this medicine contact a poison control center or emergency room at once. NOTE: This medicine is only for you. Do not share this medicine with others. What if I miss a dose? It is important not to miss a dose. Call your doctor or health care professional if you are unable to keep an appointment. What may interact with this medicine? -medicines to increase blood counts like filgrastim, pegfilgrastim, sargramostim -probenecid -some antibiotics like amikacin, gentamicin, neomycin, polymyxin B, streptomycin, tobramycin -zalcitabine Talk to your doctor or health care professional before taking any of these medicines: -acetaminophen -aspirin -ibuprofen -ketoprofen -naproxen This list may not describe all possible interactions. Give your health care provider a list of all the medicines, herbs, non-prescription drugs, or dietary supplements you use. Also tell them if you smoke, drink alcohol, or use illegal drugs. Some items may interact with your medicine. What should I watch for while using this medicine? Your condition will be monitored carefully while you are receiving this medicine. You will need important blood work done while you are taking this medicine. This medicine can make you more sensitive to cold. Do not drink cold drinks or use ice. Cover exposed skin before coming in contact with cold temperatures or cold objects. When out in cold weather wear warm clothing and cover your mouth and nose to warm the air that goes into your lungs. Tell your doctor if you get sensitive to the cold. This drug may make you feel generally unwell. This is not uncommon, as chemotherapy can affect healthy cells as well as cancer cells. Report any side effects. Continue your course of treatment even though you feel ill unless your doctor tells you to stop. In some cases, you may be given additional medicines to help with side effects. Follow all directions for their use. Call your doctor or health care  professional for advice if you get a fever, chills or sore throat, or other symptoms of a cold or flu. Do not treat yourself. This drug decreases your body's ability to fight infections. Try to avoid being around people who are sick. This medicine may increase your risk to bruise or bleed. Call your doctor or health care professional if you notice any unusual bleeding. Be careful brushing and flossing your teeth or using a toothpick because you may get an infection or bleed more easily. If you have any dental work done, tell your dentist you are receiving this medicine. Avoid taking products that contain aspirin, acetaminophen, ibuprofen, naproxen, or ketoprofen unless instructed by your doctor. These medicines may hide a fever. Do not become pregnant while taking this medicine. Women should inform their doctor if they wish to become pregnant or think they might be pregnant. There is a potential for serious side effects to an unborn child. Talk to your health care professional or pharmacist for more information. Do not breast-feed an infant while taking this medicine. Call your doctor or health care professional if you get diarrhea. Do not treat yourself. What side effects may I notice from receiving this medicine? Side effects  that you should report to your doctor or health care professional as soon as possible: -allergic reactions like skin rash, itching or hives, swelling of the face, lips, or tongue -low blood counts - This drug may decrease the number of white blood cells, red blood cells and platelets. You may be at increased risk for infections and bleeding. -signs of infection - fever or chills, cough, sore throat, pain or difficulty passing urine -signs of decreased platelets or bleeding - bruising, pinpoint red spots on the skin, black, tarry stools, nosebleeds -signs of decreased red blood cells - unusually weak or tired, fainting spells, lightheadedness -breathing problems -chest pain,  pressure -cough -diarrhea -jaw tightness -mouth sores -nausea and vomiting -pain, swelling, redness or irritation at the injection site -pain, tingling, numbness in the hands or feet -problems with balance, talking, walking -redness, blistering, peeling or loosening of the skin, including inside the mouth -trouble passing urine or change in the amount of urine Side effects that usually do not require medical attention (report to your doctor or health care professional if they continue or are bothersome): -changes in vision -constipation -hair loss -loss of appetite -metallic taste in the mouth or changes in taste -stomach pain This list may not describe all possible side effects. Call your doctor for medical advice about side effects. You may report side effects to FDA at 1-800-FDA-1088. Where should I keep my medicine? This drug is given in a hospital or clinic and will not be stored at home. NOTE: This sheet is a summary. It may not cover all possible information. If you have questions about this medicine, talk to your doctor, pharmacist, or health care provider.  2014, Elsevier/Gold Standard. (2008-01-16 17:22:47)

## 2013-07-10 ENCOUNTER — Telehealth: Payer: Self-pay | Admitting: *Deleted

## 2013-07-10 NOTE — Telephone Encounter (Signed)
Called [facility] for chemotherapy F/U.  Patient is doing well.  Denies n/v.  Denies any new side effects or symptoms.  Bowel and bladder is functioning well.  Eating and drinking well and I instructed to drink 64 oz minimum daily or at least the day before, of and after treatment.  Denies questions at this time and encouraged to call if needed.  Reports problem with fanny pack and positioning herself at night.  Wearing pack so I suggested she place pillows to support her she doesn't get tangled.  Reviewed how to call after hours in the case of an emergency.

## 2013-07-10 NOTE — Telephone Encounter (Signed)
Message copied by Cherylynn Ridges on Tue Jul 10, 2013  1:18 PM ------      Message from: Azzie Glatter      Created: Mon Jul 09, 2013  4:22 PM      Regarding: "1st time chemotherapy"       Patient received Oxaliplatin, Leucovorin, Fluororouracil 1st time today, per Dr. Juliann Mule.  Tolerated treatment well. ------

## 2013-07-11 ENCOUNTER — Ambulatory Visit (HOSPITAL_BASED_OUTPATIENT_CLINIC_OR_DEPARTMENT_OTHER): Payer: Medicaid Other

## 2013-07-11 VITALS — BP 109/61 | HR 130 | Temp 97.6°F

## 2013-07-11 DIAGNOSIS — Z452 Encounter for adjustment and management of vascular access device: Secondary | ICD-10-CM

## 2013-07-11 DIAGNOSIS — C19 Malignant neoplasm of rectosigmoid junction: Secondary | ICD-10-CM

## 2013-07-11 MED ORDER — SODIUM CHLORIDE 0.9 % IJ SOLN
10.0000 mL | INTRAMUSCULAR | Status: DC | PRN
  Administered 2013-07-11: 10 mL
  Filled 2013-07-11: qty 10

## 2013-07-11 MED ORDER — HEPARIN SOD (PORK) LOCK FLUSH 100 UNIT/ML IV SOLN
500.0000 [IU] | Freq: Once | INTRAVENOUS | Status: AC | PRN
  Administered 2013-07-11: 500 [IU]
  Filled 2013-07-11: qty 5

## 2013-07-11 NOTE — Progress Notes (Signed)
Patient in for pump start/stop today. Patient states, "There is something going on with my ears. They are not ringing. They just feel funny." Patient denies any pain, dizziness, blurred vision, or headache. Patient denies having any congestion, cough, or runny nose. Patient reports that she had vomited once today and that she took a Zofran as ordered by her doctor. Discussed Patient's complaints with Amy M., RN. Patient advised by Philis Pique., RN to take Claritin 10 mg daily for relief. Patient also advised by Philis Pique., RN to take OTC Sudafed if congestion occurs. Patient verbalized understanding. Patient advised to call [facility] if temperature is greater than 100.5 or if condition worsens. Patient verbalized understanding.

## 2013-07-12 ENCOUNTER — Encounter: Payer: Self-pay | Admitting: *Deleted

## 2013-07-12 NOTE — Progress Notes (Signed)
Received fax from [facility] to verify pt DOB.  I called to clarify and notified Dr. Boyce Medici nurse

## 2013-07-16 ENCOUNTER — Telehealth: Payer: Self-pay | Admitting: Medical Oncology

## 2013-07-16 ENCOUNTER — Other Ambulatory Visit: Payer: Self-pay | Admitting: Medical Oncology

## 2013-07-16 DIAGNOSIS — C19 Malignant neoplasm of rectosigmoid junction: Secondary | ICD-10-CM

## 2013-07-16 NOTE — Telephone Encounter (Signed)
Pt called and states that when she gets up from sitting or lying she feels a little dizzy. I asked if she has been drinking fluids and eating well. She states that a few days post chemo she did not feel like eating and she just slept a lot. She did take her nausea medications as prescribed. I asked her to increase her fluids and to sit on the edge of the bed or chair a few minutes before getting up to walk and see if this makes a difference. I explained sometimes with decrease in fluids can cause drop in blood pressure. She voiced understanding and will call back if she does not improve.

## 2013-07-16 NOTE — Telephone Encounter (Signed)
I called pt to inform her that Dr. Juliann Mule would like to get a CBC tomorrow to check her counts. This is her first chemo treatment and he would like to make sure her hgb has not dropped due to her dizziness. She states that mid-morning would be good for her. I will place a POF.

## 2013-07-17 ENCOUNTER — Ambulatory Visit (HOSPITAL_BASED_OUTPATIENT_CLINIC_OR_DEPARTMENT_OTHER): Payer: Medicaid Other

## 2013-07-17 ENCOUNTER — Telehealth: Payer: Self-pay | Admitting: Internal Medicine

## 2013-07-17 ENCOUNTER — Telehealth: Payer: Self-pay

## 2013-07-17 DIAGNOSIS — C19 Malignant neoplasm of rectosigmoid junction: Secondary | ICD-10-CM

## 2013-07-17 LAB — CBC WITH DIFFERENTIAL/PLATELET
BASO%: 0.6 % (ref 0.0–2.0)
BASOS ABS: 0 10*3/uL (ref 0.0–0.1)
EOS%: 6.8 % (ref 0.0–7.0)
Eosinophils Absolute: 0.5 10*3/uL (ref 0.0–0.5)
HEMATOCRIT: 31.7 % — AB (ref 34.8–46.6)
HEMOGLOBIN: 10.6 g/dL — AB (ref 11.6–15.9)
LYMPH%: 26.4 % (ref 14.0–49.7)
MCH: 27.5 pg (ref 25.1–34.0)
MCHC: 33.5 g/dL (ref 31.5–36.0)
MCV: 82.1 fL (ref 79.5–101.0)
MONO#: 0.6 10*3/uL (ref 0.1–0.9)
MONO%: 8.5 % (ref 0.0–14.0)
NEUT#: 4.2 10*3/uL (ref 1.5–6.5)
NEUT%: 57.7 % (ref 38.4–76.8)
Platelets: 297 10*3/uL (ref 145–400)
RBC: 3.86 10*6/uL (ref 3.70–5.45)
RDW: 13.3 % (ref 11.2–14.5)
WBC: 7.2 10*3/uL (ref 3.9–10.3)
lymph#: 1.9 10*3/uL (ref 0.9–3.3)

## 2013-07-17 NOTE — Telephone Encounter (Signed)
I reviewed her CBC results from today demonstrating a normal white blood count of 7.2, hemoglobin of 10.6 and Platelets of 297.   She denied dizziness presently.   We counseled her to continue adequate hydration and to call us back with any symptoms including fever (temp greater than 100.5).  She voiced understanding and will follow up with [place] on 07/23/2013.

## 2013-07-17 NOTE — Telephone Encounter (Signed)
S/w pt her lab appt is at 1045 today.

## 2013-07-18 ENCOUNTER — Encounter: Payer: Self-pay | Admitting: Internal Medicine

## 2013-07-18 NOTE — Progress Notes (Signed)
FOLFOX are not replaceable drugs °

## 2013-07-23 ENCOUNTER — Other Ambulatory Visit (HOSPITAL_BASED_OUTPATIENT_CLINIC_OR_DEPARTMENT_OTHER): Payer: Medicaid Other

## 2013-07-23 ENCOUNTER — Ambulatory Visit (HOSPITAL_BASED_OUTPATIENT_CLINIC_OR_DEPARTMENT_OTHER): Payer: Medicaid Other | Admitting: Internal Medicine

## 2013-07-23 ENCOUNTER — Telehealth: Payer: Self-pay | Admitting: Internal Medicine

## 2013-07-23 ENCOUNTER — Ambulatory Visit (HOSPITAL_BASED_OUTPATIENT_CLINIC_OR_DEPARTMENT_OTHER): Payer: Medicaid Other

## 2013-07-23 VITALS — BP 139/74 | HR 106 | Temp 97.9°F | Resp 18 | Ht 65.0 in | Wt 147.7 lb

## 2013-07-23 DIAGNOSIS — C787 Secondary malignant neoplasm of liver and intrahepatic bile duct: Secondary | ICD-10-CM

## 2013-07-23 DIAGNOSIS — K56609 Unspecified intestinal obstruction, unspecified as to partial versus complete obstruction: Secondary | ICD-10-CM

## 2013-07-23 DIAGNOSIS — C7889 Secondary malignant neoplasm of other digestive organs: Secondary | ICD-10-CM

## 2013-07-23 DIAGNOSIS — C19 Malignant neoplasm of rectosigmoid junction: Secondary | ICD-10-CM

## 2013-07-23 DIAGNOSIS — R000 Tachycardia, unspecified: Secondary | ICD-10-CM

## 2013-07-23 DIAGNOSIS — Z800 Family history of malignant neoplasm of digestive organs: Secondary | ICD-10-CM

## 2013-07-23 DIAGNOSIS — Z5111 Encounter for antineoplastic chemotherapy: Secondary | ICD-10-CM

## 2013-07-23 DIAGNOSIS — E559 Vitamin D deficiency, unspecified: Secondary | ICD-10-CM

## 2013-07-23 LAB — COMPREHENSIVE METABOLIC PANEL (CC13)
ALBUMIN: 3.3 g/dL — AB (ref 3.5–5.0)
ALT: 11 U/L (ref 0–55)
ANION GAP: 10 meq/L (ref 3–11)
AST: 16 U/L (ref 5–34)
Alkaline Phosphatase: 78 U/L (ref 40–150)
BUN: 18.8 mg/dL (ref 7.0–26.0)
CO2: 24 meq/L (ref 22–29)
Calcium: 12.1 mg/dL — ABNORMAL HIGH (ref 8.4–10.4)
Chloride: 105 mEq/L (ref 98–109)
Creatinine: 0.8 mg/dL (ref 0.6–1.1)
Glucose: 105 mg/dl (ref 70–140)
Potassium: 4.4 mEq/L (ref 3.5–5.1)
SODIUM: 138 meq/L (ref 136–145)
TOTAL PROTEIN: 7.5 g/dL (ref 6.4–8.3)
Total Bilirubin: <0.2 mg/dL (ref 0.20–1.20)

## 2013-07-23 LAB — CBC WITH DIFFERENTIAL/PLATELET
BASO%: 0.9 % (ref 0.0–2.0)
BASOS ABS: 0.1 10*3/uL (ref 0.0–0.1)
EOS ABS: 0.3 10*3/uL (ref 0.0–0.5)
EOS%: 4.1 % (ref 0.0–7.0)
HEMATOCRIT: 32.5 % — AB (ref 34.8–46.6)
HEMOGLOBIN: 10.7 g/dL — AB (ref 11.6–15.9)
LYMPH%: 23.3 % (ref 14.0–49.7)
MCH: 27.6 pg (ref 25.1–34.0)
MCHC: 33 g/dL (ref 31.5–36.0)
MCV: 83.6 fL (ref 79.5–101.0)
MONO#: 0.6 10*3/uL (ref 0.1–0.9)
MONO%: 9.2 % (ref 0.0–14.0)
NEUT%: 62.5 % (ref 38.4–76.8)
NEUTROS ABS: 4 10*3/uL (ref 1.5–6.5)
PLATELETS: 281 10*3/uL (ref 145–400)
RBC: 3.89 10*6/uL (ref 3.70–5.45)
RDW: 14.1 % (ref 11.2–14.5)
WBC: 6.4 10*3/uL (ref 3.9–10.3)
lymph#: 1.5 10*3/uL (ref 0.9–3.3)

## 2013-07-23 LAB — LACTATE DEHYDROGENASE (CC13): LDH: 204 U/L (ref 125–245)

## 2013-07-23 LAB — CEA: CEA: 286.2 ng/mL — ABNORMAL HIGH (ref 0.0–5.0)

## 2013-07-23 MED ORDER — ONDANSETRON 8 MG/NS 50 ML IVPB
INTRAVENOUS | Status: AC
  Filled 2013-07-23: qty 8

## 2013-07-23 MED ORDER — SODIUM CHLORIDE 0.9 % IJ SOLN
10.0000 mL | INTRAMUSCULAR | Status: DC | PRN
  Filled 2013-07-23: qty 10

## 2013-07-23 MED ORDER — DEXAMETHASONE SODIUM PHOSPHATE 10 MG/ML IJ SOLN
10.0000 mg | Freq: Once | INTRAMUSCULAR | Status: AC
  Administered 2013-07-23: 10 mg via INTRAVENOUS

## 2013-07-23 MED ORDER — FLUOROURACIL CHEMO INJECTION 2.5 GM/50ML
400.0000 mg/m2 | Freq: Once | INTRAVENOUS | Status: AC
  Administered 2013-07-23: 700 mg via INTRAVENOUS
  Filled 2013-07-23: qty 14

## 2013-07-23 MED ORDER — OXALIPLATIN CHEMO INJECTION 100 MG/20ML
85.0000 mg/m2 | Freq: Once | INTRAVENOUS | Status: AC
  Administered 2013-07-23: 150 mg via INTRAVENOUS
  Filled 2013-07-23: qty 30

## 2013-07-23 MED ORDER — LEUCOVORIN CALCIUM INJECTION 350 MG: 400.0000 mg/m2 | Freq: Once | INTRAVENOUS | Status: DC

## 2013-07-23 MED ORDER — LEUCOVORIN CALCIUM INJECTION 350 MG
400.0000 mg/m2 | Freq: Once | INTRAMUSCULAR | Status: AC
  Administered 2013-07-23: 700 mg via INTRAVENOUS
  Filled 2013-07-23: qty 35

## 2013-07-23 MED ORDER — SODIUM CHLORIDE 0.9 % IV SOLN
2400.0000 mg/m2 | INTRAVENOUS | Status: DC
  Administered 2013-07-23: 4200 mg via INTRAVENOUS
  Filled 2013-07-23: qty 84

## 2013-07-23 MED ORDER — DEXAMETHASONE SODIUM PHOSPHATE 10 MG/ML IJ SOLN
INTRAMUSCULAR | Status: AC
  Filled 2013-07-23: qty 1

## 2013-07-23 MED ORDER — HEPARIN SOD (PORK) LOCK FLUSH 100 UNIT/ML IV SOLN
500.0000 [IU] | Freq: Once | INTRAVENOUS | Status: DC | PRN
  Filled 2013-07-23: qty 5

## 2013-07-23 MED ORDER — DEXTROSE 5 % IV SOLN
Freq: Once | INTRAVENOUS | Status: AC
  Administered 2013-07-23: 11:00:00 via INTRAVENOUS

## 2013-07-23 MED ORDER — ONDANSETRON 8 MG/50ML IVPB (CHCC)
8.0000 mg | Freq: Once | INTRAVENOUS | Status: AC
  Administered 2013-07-23: 8 mg via INTRAVENOUS

## 2013-07-23 NOTE — Telephone Encounter (Signed)
gv pt appt schedule for january thru february.

## 2013-07-23 NOTE — Patient Instructions (Signed)
[facility] Discharge Instructions for Patients Receiving Chemotherapy  Today you received the following chemotherapy agents: 5FU, oxaliplatin, leucovorin  To help prevent nausea and vomiting after your treatment, we encourage you to take your nausea medication as needed   If you develop nausea and vomiting that is not controlled by your nausea medication, call the clinic.   BELOW ARE SYMPTOMS THAT SHOULD BE REPORTED IMMEDIATELY:  *FEVER GREATER THAN 100.5 F  *CHILLS WITH OR WITHOUT FEVER  NAUSEA AND VOMITING THAT IS NOT CONTROLLED WITH YOUR NAUSEA MEDICATION  *UNUSUAL SHORTNESS OF BREATH  *UNUSUAL BRUISING OR BLEEDING  TENDERNESS IN MOUTH AND THROAT WITH OR WITHOUT PRESENCE OF ULCERS  *URINARY PROBLEMS  *BOWEL PROBLEMS  UNUSUAL RASH Items with * indicate a potential emergency and should be followed up as soon as possible.  Feel free to call the clinic you have any questions or concerns. The clinic phone number is [phone].

## 2013-07-23 NOTE — Patient Instructions (Signed)
Hypercalcemia Hypercalcemia means the calcium in your blood is too high. Calcium in our blood is important for the control of many things, such as: Blood clotting. Conducting of nerve impulses. Muscle contraction. Maintaining teeth and bone health. Other body functions. In the bloodstream, calcium maintains a constant balance with another mineral, phosphate. Calcium is absorbed into the body through the small intestine. This is helped by Vitamin D. Calcium levels are maintained mostly by vitamin D and a hormone (parathyroid hormone). But the kidneys also help. Hypercalcemia can happen when the concentration of calcium is too high for the kidneys to maintain balance. The body maintains a balance between the calcium we eat and the calcium already in our body. If calcium intake is increased or we cannot use calcium properly, there may be problems. Some common sources of calcium are:  Dairy products. Nuts. Eggs. Whole grains. Legumes. Green leafy vegetables. CAUSES There are many causes of this condition, but some common ones are: Hyperparathyroidism. This is an over activity of the parathyroid gland. Cancers of the breast, kidney, lung, head and neck are common causes of calcium increases. Medications that cause you to urinate more often (diuretics), nausea, vomiting and diarrhea also increase the calcium in the blood. Overuse of calcium-containing antacids. SYMPTOMS  Many patients with mild hypercalcemia have no symptoms. For those with symptoms common problems include: Loss of appetite. Constipation. Increased thirst. Heart rhythm changes. Abnormal thinking. Nausea. Abdominal pain. Kidney stones. Mood swings. Coma and death when severe. Vomiting. Increased urination. High blood pressure. Confusion. DIAGNOSIS  Your caregiver will do a medical history and perform a physical exam on you. Calcium and parathyroid hormone (PTH) may be measured with a blood test. TREATMENT  The  treatment depends on the calcium level and what is causing the higher level. Hypercalcemia can be lifethreatening. Fast lowering of the calcium level may be necessary. With normal kidney function, fluids can be given by vein to clear the excess calcium. Hemodialysis works well to reduce dangerous calcium levels if there is poor kidney function. This is a procedure in which a machine is used to filter out unwanted substances. The blood is then returned to the body. Drugs, such as diuretics, can be given after adequate fluid intake is established. These medications help the kidneys get rid of extra calcium. Drugs that lessen (inhibit) bone loss are helpful in gaining long-term control. Phosphate pills help lower high calcium levels caused by a low supply of phosphate. Anti-inflammatory agents such as steroids are helpful with some cancers and toxic levels of vitamin D. Treatment of the underlying cause of the hypercalcemia will also correct the imbalance. Hyperparathyroidism is usually treated by surgical removal of one or more of the parathyroid glands and any tissue, other than the glands themselves, that is producing too much hormone. The hypercalcemia caused by cancer is difficult to treat without controlling the cancer. Symptoms can be improved with fluids and drug therapy as outlined above. PROGNOSIS  Surgery to remove the parathyroid glands is usually successful. This also depends on the amount of damage to the kidneys and whether or not it can be treated. Mild hypercalcemia can be controlled with good fluid intake and the use of effective medications. Hypercalcemia often develops as a late complication of cancer. The expected outlook is poor without effective anticancer therapy. PREVENTION  If you are at risk for developing hypercalcemia, be familiar with early symptoms. Report these to your caregiver. Good fluid intake (up to four quarts of liquid a day if possible)  is helpful. Try to control  nausea and vomiting, and treat fevers to avoid dehydration. Lowering the amount of calcium in your diet is not necessary. High blood calcium reduces absorption of calcium in the intestine. Stay as active as possible. SEEK IMMEDIATE MEDICAL CARE IF:  You develop chest pain, sweating, or shortness of breath. You get confused, feel faint or pass out. You develop severe nausea and vomiting. MAKE SURE YOU:  Understand these instructions. Will watch your condition. Will get help right away if you are not doing well or get worse. Document Released: 09/04/2004 Document Revised: 10/16/2012 Document Reviewed: 06/16/2010 [facility] Patient Information 2014 [place], [place]. Nausea and Vomiting Nausea is a sick feeling that often comes before throwing up (vomiting). Vomiting is a reflex where stomach contents come out of your mouth. Vomiting can cause severe loss of body fluids (dehydration). Children and elderly adults can become dehydrated quickly, especially if they also have diarrhea. Nausea and vomiting are symptoms of a condition or disease. It is important to find the cause of your symptoms. CAUSES   Direct irritation of the stomach lining. This irritation can result from increased acid production (gastroesophageal reflux disease), infection, food poisoning, taking certain medicines (such as nonsteroidal anti-inflammatory drugs), alcohol use, or tobacco use.  Signals from the brain.These signals could be caused by a headache, heat exposure, an inner ear disturbance, increased pressure in the brain from injury, infection, a tumor, or a concussion, pain, emotional stimulus, or metabolic problems.  An obstruction in the gastrointestinal tract (bowel obstruction).  Illnesses such as diabetes, hepatitis, gallbladder problems, appendicitis, kidney problems, cancer, sepsis, atypical symptoms of a heart attack, or eating disorders.  Medical treatments such as chemotherapy and radiation.  Receiving  medicine that makes you sleep (general anesthetic) during surgery. DIAGNOSIS Your caregiver may ask for tests to be done if the problems do not improve after a few days. Tests may also be done if symptoms are severe or if the reason for the nausea and vomiting is not clear. Tests may include:  Urine tests.  Blood tests.  Stool tests.  Cultures (to look for evidence of infection).  X-rays or other imaging studies. Test results can help your caregiver make decisions about treatment or the need for additional tests. TREATMENT You need to stay well hydrated. Drink frequently but in small amounts.You may wish to drink water, sports drinks, clear broth, or eat frozen ice pops or gelatin dessert to help stay hydrated.When you eat, eating slowly may help prevent nausea.There are also some antinausea medicines that may help prevent nausea. HOME CARE INSTRUCTIONS   Take all medicine as directed by your caregiver.  If you do not have an appetite, do not force yourself to eat. However, you must continue to drink fluids.  If you have an appetite, eat a normal diet unless your caregiver tells you differently.  Eat a variety of complex carbohydrates (rice, wheat, potatoes, bread), lean meats, yogurt, fruits, and vegetables.  Avoid high-fat foods because they are more difficult to digest.  Drink enough water and fluids to keep your urine clear or pale yellow.  If you are dehydrated, ask your caregiver for specific rehydration instructions. Signs of dehydration may include:  Severe thirst.  Dry lips and mouth.  Dizziness.  Dark urine.  Decreasing urine frequency and amount.  Confusion.  Rapid breathing or pulse. SEEK IMMEDIATE MEDICAL CARE IF:   You have blood or brown flecks (like coffee grounds) in your vomit.  You have black or bloody stools.  You have a severe headache or stiff neck.  You are confused.  You have severe abdominal pain.  You have chest pain or trouble  breathing.  You do not urinate at least once every 8 hours.  You develop cold or clammy skin.  You continue to vomit for longer than 24 to 48 hours.  You have a fever. MAKE SURE YOU:   Understand these instructions.  Will watch your condition.  Will get help right away if you are not doing well or get worse. Document Released: 06/21/2005 Document Revised: 09/13/2011 Document Reviewed: 11/18/2010 [facility] Patient Information 2014 [place], [place].

## 2013-07-24 ENCOUNTER — Telehealth: Payer: Self-pay | Admitting: Medical Oncology

## 2013-07-24 ENCOUNTER — Emergency Department (HOSPITAL_COMMUNITY): Payer: Medicaid Other

## 2013-07-24 ENCOUNTER — Emergency Department (HOSPITAL_COMMUNITY)
Admission: EM | Admit: 2013-07-24 | Discharge: 2013-07-25 | Disposition: A | Discharge status: Home / Self Care | Payer: Medicaid Other | Attending: Emergency Medicine | Admitting: Emergency Medicine

## 2013-07-24 ENCOUNTER — Other Ambulatory Visit: Payer: Self-pay

## 2013-07-24 ENCOUNTER — Encounter (HOSPITAL_COMMUNITY): Payer: Self-pay | Admitting: Emergency Medicine

## 2013-07-24 DIAGNOSIS — C189 Malignant neoplasm of colon, unspecified: Secondary | ICD-10-CM | POA: Insufficient documentation

## 2013-07-24 DIAGNOSIS — M549 Dorsalgia, unspecified: Secondary | ICD-10-CM | POA: Insufficient documentation

## 2013-07-24 DIAGNOSIS — R071 Chest pain on breathing: Secondary | ICD-10-CM | POA: Insufficient documentation

## 2013-07-24 DIAGNOSIS — F411 Generalized anxiety disorder: Secondary | ICD-10-CM | POA: Insufficient documentation

## 2013-07-24 DIAGNOSIS — Z79899 Other long term (current) drug therapy: Secondary | ICD-10-CM | POA: Insufficient documentation

## 2013-07-24 DIAGNOSIS — Z9889 Other specified postprocedural states: Secondary | ICD-10-CM | POA: Insufficient documentation

## 2013-07-24 DIAGNOSIS — IMO0002 Reserved for concepts with insufficient information to code with codable children: Secondary | ICD-10-CM | POA: Insufficient documentation

## 2013-07-24 DIAGNOSIS — R0789 Other chest pain: Secondary | ICD-10-CM

## 2013-07-24 DIAGNOSIS — Z87891 Personal history of nicotine dependence: Secondary | ICD-10-CM | POA: Insufficient documentation

## 2013-07-24 LAB — CBC
HEMATOCRIT: 32.9 % — AB (ref 36.0–46.0)
HEMOGLOBIN: 11.1 g/dL — AB (ref 12.0–15.0)
MCH: 27.6 pg (ref 26.0–34.0)
MCHC: 33.7 g/dL (ref 30.0–36.0)
MCV: 81.8 fL (ref 78.0–100.0)
Platelets: 238 10*3/uL (ref 150–400)
RBC: 4.02 MIL/uL (ref 3.87–5.11)
RDW: 14.1 % (ref 11.5–15.5)
WBC: 5 10*3/uL (ref 4.0–10.5)

## 2013-07-24 LAB — TROPONIN I: Troponin I: <0.3 ng/mL (ref <0.30)

## 2013-07-24 LAB — BASIC METABOLIC PANEL
BUN: 20 mg/dL (ref 6–23)
CO2: 23 mEq/L (ref 19–32)
CREATININE: 0.76 mg/dL (ref 0.50–1.10)
Calcium: 12.6 mg/dL — ABNORMAL HIGH (ref 8.4–10.5)
Chloride: 101 mEq/L (ref 96–112)
GFR calc Af Amer: >90 mL/min (ref >90)
GFR, EST NON AFRICAN AMERICAN: 89 mL/min — AB (ref >90)
GLUCOSE: 127 mg/dL — AB (ref 70–99)
POTASSIUM: 4.7 meq/L (ref 3.7–5.3)
Sodium: 138 mEq/L (ref 137–147)

## 2013-07-24 LAB — POCT I-STAT TROPONIN I: Troponin i, poc: 0.02 ng/mL (ref 0.00–0.08)

## 2013-07-24 LAB — PRO B NATRIURETIC PEPTIDE: Pro B Natriuretic peptide (BNP): 78.1 pg/mL (ref 0–125)

## 2013-07-24 MED ORDER — PROMETHAZINE HCL 25 MG/ML IJ SOLN
12.5000 mg | Freq: Once | INTRAMUSCULAR | Status: DC
  Filled 2013-07-24: qty 1

## 2013-07-24 MED ORDER — ONDANSETRON HCL 4 MG/2ML IJ SOLN
4.0000 mg | Freq: Once | INTRAMUSCULAR | Status: AC
  Administered 2013-07-24: 4 mg via INTRAVENOUS
  Filled 2013-07-24: qty 2

## 2013-07-24 MED ORDER — SODIUM CHLORIDE 0.9 % IV BOLUS (SEPSIS)
500.0000 mL | Freq: Once | INTRAVENOUS | Status: AC
  Administered 2013-07-24: 500 mL via INTRAVENOUS

## 2013-07-24 MED ORDER — ONDANSETRON HCL 4 MG/2ML IJ SOLN
4.0000 mg | Freq: Once | INTRAMUSCULAR | Status: DC
  Filled 2013-07-24: qty 2

## 2013-07-24 MED ORDER — SODIUM CHLORIDE 0.9 % IV SOLN
INTRAVENOUS | Status: DC
  Administered 2013-07-24: 20:00:00 via INTRAVENOUS

## 2013-07-24 MED ORDER — IOHEXOL 350 MG/ML SOLN
80.0000 mL | Freq: Once | INTRAVENOUS | Status: AC | PRN
  Administered 2013-07-24: 80 mL via INTRAVENOUS

## 2013-07-24 MED ORDER — HYDROMORPHONE HCL PF 1 MG/ML IJ SOLN
1.0000 mg | Freq: Once | INTRAMUSCULAR | Status: DC
  Filled 2013-07-24: qty 1

## 2013-07-24 MED ORDER — HYDROMORPHONE HCL PF 1 MG/ML IJ SOLN
1.0000 mg | Freq: Once | INTRAMUSCULAR | Status: AC
  Administered 2013-07-24: 1 mg via INTRAVENOUS
  Filled 2013-07-24: qty 1

## 2013-07-24 NOTE — ED Notes (Signed)
CT Personnel Tommy informed of patient's new IV.

## 2013-07-24 NOTE — ED Notes (Addendum)
Pt reports mid upper chest pains since this am. Describes it as a dull pain and mild sob and n/v. No resp distress noted at triage, ekg done. Pt currently receiving chemo for colon cancer. Denies recent cough. Mask on pt at triage.

## 2013-07-24 NOTE — Telephone Encounter (Signed)
Pt called back to say she took zantac and tums for the heartburn. It worked only for a short while and now it is back.I could hear that pt sounded short of breath.  After questioning the pt .Marland Kitchen She states her chest hurts, her left arm has some pain and she does have some shortness of breath. I instructed her to call 911 and go to [facility]. She voiced understanding. Dr. Juliann Mule is aware.

## 2013-07-24 NOTE — Progress Notes (Signed)
[facility] OFFICE PROGRESS NOTE  No PCP Per Patient [place] [address]  DIAGNOSIS: Colorectal cancer, stage IV - Plan: CBC with Differential, Comprehensive metabolic panel (Cmet) - [facility], Lactate dehydrogenase (LDH) - [facility], CBC with Differential, Ambulatory referral to Endocrinology  Colon obstruction  Family history of colorectal cancer  Hypercalcemia - Plan: Ambulatory referral to Endocrinology  Tachycardia  Chief Complaint  Patient presents with  . Colorectal cancer, stage IV   CURRENT THERAPY:  FOLFOX started on Cycle #1, 07/09/2013. Cycle #1B, 07/23/2013 scheduled.   INTERVAL HISTORY: AVALYNN BOWE 63 y.o. 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   On her original presentation,  she denied any hematochezia or melana  but reported intermittent blood in stool over the past several months. She also reported 3 episodes of loose bowel movements per day. She had an CT of her abdomen done revealing an area of bowel wall thickening around the rectosigmoid junction concerning for malignancy. In addition, lesions were noted over the left lobe of the liver. [facility] were consulted. She had colonoscopy with stent placement and biopsy on 05/25/2013. In addition, she had a MRI of the abdomen confirming above and there was also a 4.5 x 4 cm splenic mass, a 3 x 3.3 cm liver mass left lobe, suggestive of a metastatic cancer. She was found to have an elevated CEA of 225. Her pathology revealed invasive adenocarcinoma.  Of note, her history is notable for her sister with colon cancer who died at age 67. She had not had a screening colonoscopy. She reports a 10 lb weight lost over the past several months.   She was last seen by me on 07/02/2013. She completed a liver biopsy which  confirmed metastatic disease and she had that obtained as indicated below.  She also had a CT of chest which showed pulmonary nodules both less than 1 cm (see official radiology below).  She was seen by surgery and had a Port-a-cath placement of 06/25/2013.    Today, she reports her bowel movements are regular and that she is with minimal pain.  She denies any fevers or chills.  She also reports that her weight is stable with an adequate appetite.   She is very active and handles her basic, intermediate and advanced ADLs without difficulties. She stated she had nausea and vomiting the first 2 days of cycle #1.  Otherwise she did well.  Her weight has increased by 4 lbs today.   MEDICAL HISTORY: Past Medical History  Diagnosis Date  . Colon cancer   . Anxiety     INTERIM HISTORY: has Rectal mass; Colon obstruction; Hypercalcemia; Colorectal cancer, stage IV; Family history of colorectal cancer; Thrush, oral; and Tachycardia on her problem list.    ALLERGIES:  is allergic to codeine.  MEDICATIONS: has a current medication list which includes the following prescription(s): cyanocobalamin, dexamethasone, lidocaine-prilocaine, lorazepam, ondansetron, polyethylene glycol, prochlorperazine, and tramadol.  SURGICAL HISTORY:  Past Surgical History  Procedure Laterality Date  . Abdominal hysterectomy  2005  . Cesarean section  1976  . Flexible sigmoidoscopy N/A 05/25/2013    Procedure: FLEXIBLE SIGMOIDOSCOPY;  Surgeon: [name], MD;  Location: [facility];  Service: Endoscopy;  Laterality: N/A;  needs floro  . Colonic stent placement N/A 05/25/2013    Procedure: COLONIC STENT PLACEMENT;  Surgeon: [name], MD;  Location: [facility];  Service: Endoscopy;  Laterality: N/A;  . Portacath placement Right 06/25/2013    Procedure: ULTRA SOUND GUIDED INSERTION PORT-A-CATH;  Surgeon: [name], MD;  Location: [facility];  Service: General;  Laterality: Right;    REVIEW OF SYSTEMS:   Constitutional: Denies fevers, chills or abnormal weight loss Eyes: Denies blurriness of vision Ears, nose, mouth, throat, and face: Denies mucositis or sore throat Respiratory: Denies cough, dyspnea or wheezes Cardiovascular: Denies palpitation, chest discomfort or lower extremity swelling Gastrointestinal:  Denies nausea, heartburn or change in bowel habits Skin: Denies abnormal skin rashes Lymphatics: Denies new lymphadenopathy or easy bruising Neurological:Denies numbness, tingling or new weaknesses Behavioral/Psych: Mood is stable, no new changes  All other systems were reviewed with the patient and are negative.  PHYSICAL EXAMINATION: ECOG PERFORMANCE STATUS: 0 - Asymptomatic  Blood pressure 139/74, pulse 106, temperature 97.9 F (36.6 C), temperature source Oral, resp. rate 18, height _0  (1.651 m), weight 147 lb 11.2 oz (66.996 kg), SpO2 100.00%.  GENERAL:alert, no distress and comfortable; well-developed, well nourished.  SKIN: skin color, texture, turgor are normal, no rashes or significant lesions; + R Port a cath without tenderness  EYES: normal, Conjunctiva are pink and non-injected, sclera clear OROPHARYNX:no exudate, no erythema and lips, buccal mucosa, and tongue with whitish strips NECK: supple, thyroid normal size, non-tender, without nodularity LYMPH:  no palpable lymphadenopathy in the cervical, axillary or supraclavicular LUNGS: clear to auscultation and percussion with normal breathing effort HEART: Tachycardic with regular rhythm and no murmurs and no lower extremity edema ABDOMEN:abdomen soft, non-tender and normal bowel sounds; + mild distention  Musculoskeletal:no cyanosis of digits and no clubbing  NEURO: alert & oriented x 3 with fluent speech, no focal motor/sensory deficits  LABORATORY DATA: Results for orders placed in visit on 07/23/13 (from the past 48 hour(s))  CBC WITH DIFFERENTIAL     Status: Abnormal   Collection Time     07/23/13  9:45 AM      Result Value Range   WBC 6.4  3.9 - 10.3 10e3/uL   NEUT# 4.0  1.5 - 6.5 10e3/uL   HGB 10.7 (*) 11.6 - 15.9 g/dL   HCT 32.5 (*) 34.8 - 46.6 %   Platelets 281  145 - 400 10e3/uL   MCV 83.6  79.5 - 101.0 fL   MCH 27.6  25.1 - 34.0 pg   MCHC 33.0  31.5 - 36.0 g/dL   RBC 3.89  3.70 - 5.45 10e6/uL   RDW 14.1  11.2 - 14.5 %   lymph# 1.5  0.9 - 3.3 10e3/uL   MONO# 0.6  0.1 - 0.9 10e3/uL   Eosinophils Absolute 0.3  0.0 - 0.5 10e3/uL   Basophils Absolute 0.1  0.0 - 0.1 10e3/uL   NEUT% 62.5  38.4 - 76.8 %   LYMPH% 23.3  14.0 - 49.7 %   MONO% 9.2  0.0 - 14.0 %   EOS% 4.1  0.0 - 7.0 %   BASO% 0.9  0.0 - 2.0 %  COMPREHENSIVE METABOLIC PANEL (BL39)     Status: Abnormal   Collection Time    07/23/13  9:46 AM      Result Value Range   Sodium 138  136 - 145 mEq/L   Potassium 4.4  3.5 - 5.1 mEq/L   Chloride 105  98 - 109 mEq/L   CO2 24  22 - 29 mEq/L   Glucose 105  70 -  140 mg/dl   BUN 18.8  7.0 - 26.0 mg/dL   Creatinine 0.8  0.6 - 1.1 mg/dL   Total Bilirubin <0.20  0.20 - 1.20 mg/dL   Alkaline Phosphatase 78  40 - 150 U/L   AST 16  5 - 34 U/L   ALT 11  0 - 55 U/L   Total Protein 7.5  6.4 - 8.3 g/dL   Albumin 3.3 (*) 3.5 - 5.0 g/dL   Calcium 12.1 (*) 8.4 - 10.4 mg/dL   Anion Gap 10  3 - 11 mEq/L  CEA     Status: Abnormal   Collection Time    07/23/13  9:46 AM      Result Value Range   CEA 286.2 (*) 0.0 - 5.0 ng/mL   Comment: Result confirmed by automatic dilution.  LACTATE DEHYDROGENASE (CC13)     Status: None   Collection Time    07/23/13  9:46 AM      Result Value Range   LDH 204  125 - 245 U/L    Labs:  Lab Results  Component Value Date   WBC 6.4 07/23/2013   HGB 10.7* 07/23/2013   HCT 32.5* 07/23/2013   MCV 83.6 07/23/2013   PLT 281 07/23/2013   NEUTROABS 4.0 07/23/2013      Chemistry      Component Value Date/Time   NA 138 07/23/2013 0946   NA 136 05/26/2013 0521   K 4.4 07/23/2013 0946   K 3.3* 05/26/2013 0521   CL 102 05/26/2013 0521   CO2  24 07/23/2013 0946   CO2 25 05/26/2013 0521   BUN 18.8 07/23/2013 0946   BUN 16 05/26/2013 0521   CREATININE 0.8 07/23/2013 0946   CREATININE 0.61 05/26/2013 0521      Component Value Date/Time   CALCIUM 12.1* 07/23/2013 0946   CALCIUM 10.8* 05/26/2013 0521   ALKPHOS 78 07/23/2013 0946   ALKPHOS 89 05/22/2013 0930   AST 16 07/23/2013 0946   AST 26 05/22/2013 0930   ALT 11 07/23/2013 0946   ALT 15 05/22/2013 0930   BILITOT <0.20 07/23/2013 0946   BILITOT 0.2* 05/22/2013 0930     Results for CAPRINA, WUSSOW (MRN [id number]) as of 06/05/2013 16:08  Ref. Range 05/22/2013 09:30 05/23/2013 20:47 05/25/2013 04:33 05/26/2013 05:21 06/04/2013 11:13  Calcium Latest Range: 8.4-10.5 mg/dL 12.4 (H) 12.0 (H) 11.0 (H) 10.8 (H) 12.7 (H)   Basic Metabolic Panel:  Recent Labs Lab 07/23/13 0946  NA 138  K 4.4  CO2 24  GLUCOSE 105  BUN 18.8  CREATININE 0.8  CALCIUM 12.1*   GFR Estimated Creatinine Clearance: 66.5 ml/min (by C-G formula based on Cr of 0.8). Liver Function Tests:  Recent Labs Lab 07/23/13 0946  AST 16  ALT 11  ALKPHOS 78  BILITOT <0.20  PROT 7.5  ALBUMIN 3.3*   CBC:  Recent Labs Lab 07/17/13 1045 07/23/13 0945  WBC 7.2 6.4  NEUTROABS 4.2 4.0  HGB 10.6* 10.7*  HCT 31.7* 32.5*  MCV 82.1 83.6  PLT 297 281   Microbiology No results found for this or any previous visit (from the past 240 hour(s)).   Ref. Range 05/26/2013 05:21  PTH Latest Range: 14.0-72.0 pg/mL 192.5 (H)     Ref. Range 05/24/2013 18:02  Calcium Ionized Latest Range: 1.13-1.30 mmol/L 1.62 (H)     Ref. Range 06/04/2013 11:13  CEA Latest Range: 0.0-5.0 ng/mL 140.5 (H)     Ref. Range 05/25/2013 16:06  Vit D, 25-Hydroxy Latest Range:  30-89 ng/mL 19 (L)   Studies:  No results found.   RADIOGRAPHIC STUDIES: CT CHEST WITH CONTRAST  TECHNIQUE: ultidetector CT imaging of the chest was performed during  intravenous contrast administration. CONTRAST: 21mL OMNIPAQUE IOHEXOL 300 MG/ML SOLN  COMPARISON: 05/22/2013 FINDINGS:  Hypodense hepatic and splenic masses noted. Left kidney upper pole 2  mm nonobstructive calculus. Small epicardial lymph nodes noted. No pathologic thoracic adenopathy. Vascular structures unremarkable. 4 x 5 mm noncalcified solid nodule, left lower lobe, image 30 of series 5, stable from the CT scan of 05/22/2013. 2 mm right middle lobe nodule, image 31 of series 5. No other nodules observed. Mild thoracic spondylosis.  IMPRESSION: 1. 4 x 5 mm pulmonary nodule, left lower lobe, noncalcified. 2 mm right middle lobe pulmonary nodule. Although quite likely benign and too small for reliable PET-CT assessment or biopsy, these nodules merit attention on followup CT scans to exclude growth.  2. Scattered hepatic and splenic hypodense lesions as shown on recent abdomen workups. 3. Nonobstructive left nephrolithiasis.   Dg Pelvis 1-2 Views  05/25/2013   CLINICAL DATA:  Colon cancer.  Insertion of colonic stent.  EXAM: PELVIS - 1-2 VIEW  COMPARISON:  05/22/2013 CT.  Fluoroscopic time:  5 min and 30 seconds  FINDINGS: Four intraoperative C-arm views submitted for review after procedure. This reveals placement of a rectosigmoid stent with narrowing of the mid aspect of the stent. Contrast was instilled. On limited imaging, no extravasation detected.  IMPRESSION: Placement of a rectosigmoid stent with narrowing of the mid aspect of the stent.   Electronically Signed   By: Chauncey Cruel M.D.   On: 05/25/2013 10:55   Mr Abdomen W Wo Contrast  05/24/2013   CLINICAL DATA:  Abdominal pain, liver lesion on CT  EXAM: MRI ABDOMEN WITHOUT AND WITH CONTRAST  TECHNIQUE: Multiplanar multisequence MR imaging of the abdomen was performed both before and after the administration of intravenous contrast.  CONTRAST:  69mL MULTIHANCE GADOBENATE DIMEGLUMINE 529 MG/ML IV SOLN  COMPARISON:  CT abdomen pelvis dated 05/22/2013  FINDINGS: 5 mm pulmonary nodule in the left lower lobe (series 3/image 7).  2.7  x 3.9 cm lesion in the medial segment left hepatic lobe (series 1102/image 36), corresponding to the CT abnormality, demonstrates heterogeneous/peripheral enhancement, no intracellular lipid, and restricted diffusion (series 400/image 23). This appearance is suspicious for metastasis.  Two additional hepatic lesions with characteristic peripheral nodular discontinuous enhancement of benign hemangiomas, as follows:  --7.7 x 5.0 cm lesion in the right hepatic lobe (series 1102/ image 44)  --1.9 x 2.4 cm lesion in the posterior right hepatic dome (series 1102/image 25)  Multiple additional tiny T2 hyperintense lesions likely reflect small cysts or hemangiomas (for example, series 3/images 15, 17, 19, 20, and 22), but are too small to characterize.  3.7 x 4.4 cm heterogeneously enhancing lesion along the superior spleen (series 13/ image 37), indeterminate, but worrisome for metastasis.  Pancreas and adrenal glands are within normal limits.  Gallbladder is unremarkable. No intrahepatic or extrahepatic ductal dilatation.  Kidneys are within normal limits.  No hydronephrosis.  No abdominal ascites.  No suspicious abdominal lymphadenopathy.  Multiple dilated loops of colon, possibly reflecting some degree of colonic obstruction related to known distal colonic mass on CT.  Enhancing T2 hyperintense lesion in the right T3 vertebral body (series 3/ image 37), favored to reflect a benign vertebral hemangioma.  IMPRESSION: 2.7 x 3.9 cm enhancing lesion in the medial segment left hepatic lobe, corresponding to the CT abnormality,  suspicious for metastasis.  Two additional benign hemangiomas, as described above. Multiple additional tiny probable hepatic cysts or hemangiomas, too small to characterize.  3.7 x 4.4 cm enhancing lesion along the superior spleen, indeterminate but worrisome for metastasis.  Multiple dilated loops of colon, possibly reflecting some degree of colonic obstruction related to known distal colonic mass on  CT.   Electronically Signed   By: Julian Hy M.D.   On: 05/24/2013 08:43   Ct Abdomen Pelvis W Contrast  05/24/2013   ADDENDUM REPORT: 05/24/2013 08:51  ADDENDUM: Addendum 05/24/2013 at 0849 hr  There is a 4.5 x 4 cm heterogeneously enhancing splenic mass which is well-circumscribed. When correlated with the MRI performed 05/24/2013 and the colonic findings, the appearance is concerning for metastatic disease.   Electronically Signed   By: Kathreen Devoid   On: 05/24/2013 08:51   05/24/2013   CLINICAL DATA:  Generalized abdominal pain  EXAM: CT ABDOMEN AND PELVIS WITH CONTRAST  TECHNIQUE: Multidetector CT imaging of the abdomen and pelvis was performed using the standard protocol following bolus administration of intravenous contrast.  CONTRAST:  19mL OMNIPAQUE IOHEXOL 300 MG/ML  SOLN  COMPARISON:  None.  FINDINGS: There is a 5.6 mm left lower lobe pulmonary nodule (image 6/series 3).  There is a low-attenuation area in the left hepatic lobe adjacent to the falciform ligament measuring 3 x 3.3 cm with vessels coursing through this area of low-attenuation suggesting an area of focal fatty deposition. There is a 8.2 x 5.1 cm hypodense mass in the right hepatic lobe with peripheral nodular enhancement with progressive filling in on delayed phase imaging most consistent with a hemangioma. There is a smaller similar mass measuring 2 x 1.3 cm in the right hepatic lobe near the dome of the liver with peripheral nodular enhancement. There is no intrahepatic or extrahepatic biliary ductal dilatation. The gallbladder is normal. The spleen demonstrates no focal abnormality. There are nonobstructing left renal calculi with the largest measuring 9 mm. The right kidney, adrenal glands and pancreas are normal. The bladder is unremarkable.  The stomach, duodenum, small intestine, and large intestine demonstrate no contrast extravasation or dilatation. There is a large amount of stool throughout the colon. There is a  normal caliber appendix in the right lower quadrant without periappendiceal inflammatory changes.There is a relative area of bowel wall thickening with shouldering of the proximal and distal margins, involving the rectosigmoid junction with slight haziness along the right lateral aspect of the bowel wall. There is no pneumoperitoneum, pneumatosis, or portal venous gas. There is no abdominal or pelvic free fluid. There is no lymphadenopathy.  The abdominal aorta is normal in caliber with atherosclerosis.  There are no lytic or sclerotic osseous lesions.  IMPRESSION: 1. There is a relative area of bowel wall thickening with shouldering of the proximal and distal margins, involving the rectosigmoid junction with slight haziness along the right lateral aspect of the bowel wall. The area is concerning for malignancy. Recommend further evaluation with colonoscopy.  There is a large amount of stool throughout the colon proximal to this area.  2. There are 2 hypodense right hepatic mass is with peripheral nodular enhancement most consistent with hemangiomas.  3.  Nonobstructing left renal calculi.  4. There is a low-attenuation area in the left hepatic lobe adjacent to the falciform ligament measuring 3 x 3.3 cm with vessels coursing through this area of low-attenuation suggesting an area of focal fatty deposition. If there is further clinical concern recommend an MRI  of the abdomen without and with intravenous contrast.  Electronically Signed: By: Kathreen Devoid On: 05/22/2013 10:20   Dg C-arm 61-120 Min-no Report  05/25/2013   CLINICAL DATA: bowel obstruction   C-ARM 61-120 MINUTES  Fluoroscopy was utilized by the requesting physician.  No radiographic  interpretation.    PATHOLOGY: Diagnosis Rectum, biopsy, proximal - INVASIVE ADENOCARCINOMA. Microscopic Comment Microsatellite instability testing can be performed upon request. (HCL:caf 05/28/13) Aldona Bar MD  Pathologist, Electronic Signature(Case signed  05/28/2013) Specimen(s) Obtained: Rectum, biopsy, proximal  Specimen Clinical Information stent colon mass stricture (kp)  Gross  Received in formalin are tan, soft tissue fragments that are submitted in toto. Number: five, Size: 0.1 to 0.5 cm, one block. (MM:gt, 05/25/13)  Diagnosis LIVER, FINE NEEDLE ASPIRATION, LEFT LOBE(SPECIMEN 1 OF 1 COLLECTED 06/12/13): MALIGNANT CELLS CONSISTENT WITH METASTATIC ADENOCARCINOMA OF COLONIC PRIMARY. Preliminary Diagnosis  ADEQUATE(HCL)  Aldona Bar MD Pathologist, Electronic Signature  (Case signed 06/13/2013) Specimen Clinical Information  colon cancer, possible metastatic disease Source  Liver, Fine Needle Aspiration, left lobe (specimen 1 of 1 collected 06/12/13)  Gross  Specimen: Received is/are _ Prepared:1) 2 slides (1 quick stain) 2) 2 slides (1 quick stain) 3) 2 slides (1 quick stain). 30cc's of pink Cytolyt solution from needle rinses. (DB:BS:bs) # Smears: 6  # Concentration Technique Slides (i.e. ThinPrep): 1 # Cell Block: 1 Additional Studies: n/a  Mismatch Repair (MMR) Protein Immunohistochemistry (IHC) IHC Expression Result: MLH1: Preserved nuclear expression (greater 50% tumor expression) MSH2: Preserved nuclear expression (greater 50% tumor expression) MSH6: Preserved nuclear expression (greater 50% tumor expression) PMS2: Preserved nuclear expression (greater 50% tumor expression) * Internal control demonstrates intact nuclear expression Interpretation: NORMAL There is preserved expression of the major and minor MMR proteins. There is a very low probability that microsatellite instability (MSI) is present. However, certain clinically significant MMR protein mutations may result in preservation of nuclear expression. It is recommended that the preservation of protein expression be correlated with molecular based MSI testing. References: 1. Guidelines on Genetic Evaluation and Management of Lynch Syndrome: A Consensus Statement by the [place]  Multi-Society Task Force on Colorectal Cancer Gae Dry. Sherlie Ban , MD, and others . Am Nicki [place] 2014; [phone]; doi: 10.1038/ajg.2014.186; published online 23 January 2013 2. Outcomes of screening endometrial cancer patients for Lynch syndrome by patient-administered checklist. Olena Heckle MS, and others. Gynecol Oncol 2013;131(3):619-623. Aldona Bar MD Pathologist, Electronic Signature ( Signed 06/11/2013)  ASSESSMENT: Park Breed 63 y.o. female with a history of Colorectal cancer, stage IV - Plan: CBC with Differential, Comprehensive metabolic panel (Cmet) - [facility], Lactate dehydrogenase (LDH) - [facility], CBC with Differential, Ambulatory referral to Endocrinology  Colon obstruction  Family history of colorectal cancer  Hypercalcemia - Plan: Ambulatory referral to Endocrinology  Tachycardia   ASSESSMENT: 63 yo AAF with family history of colon cancer, admitted with obstructive mass s/p stent and biopsy on 11/21 consistent with metastatic colon cancer with mets to liver, spleen. ECOG 0. Elevated CEA.  PLAN:  1. 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   -- On prior visits, we reviewed her recent imaging and pathology in detail.  Based on imaging and pathology by liver biopsy, she is stage IV.  We discussed that this less likely to be curable.  To complete staging, we obtained a CT of Chest to look for additional EOD. It revealed 4 x 5 mm pulmonary nodule, left lower lobe, noncalcified. 2 mm right middle lobe pulmonary nodule which requires follow-up to  exclude increase in size.  In addition, we will set up for an ultrasound-guided biopsy to confirm metastatic disease which was done and consistent with colon adenocarcinoma.   Prior biology of rectum as noted above.  We discussed case with pathology (Dr. Donato Heinz) who performed MSI testing on primary tumor sample which was negative for MSI;  We have requested additional testing KRAS, BRAF and NRAS testing on  ultrasound-guided biopsy sample (given a 85% concordance rate between primary tumor and metastatic site).  We will add cetuximab or panitumumab if KRAS/NRAS wild type only. (per NCCN guidelines).   --On prior visits, we  reviewed her treatment options including clinical trial enrollment with [facility], a phase II study of irinotecan dosing in metastatic colorectal cancer (mCRC) patients receiving FOLFIRI + Bevacizumab (avastin); surgery followed by chemotherapy or vice/versa; chemotherapy alone, i.e., combinations to consider would be based on mutational status and functional status, i.e., FOLFOX.  However, given placement of stent and with discussion with surgery, she will not be a candidate for avastatin.  Avastatin use in the setting of stent placement lead to perforation risk between 15 - 50% according to some clinical trials.  Baseline rate of perforation without stent placement is 5 to 6%.   Given this substantially increased risk of perforation, she will not be a candidate for [facility].   We then discussed FOLFOX and FOLFIRI as initial chemotherapy treatments.     --There is no difference in overall response rate, time to progression and overall survival for patients treated with FOLFIRI versus FOLFOX4 in the treatment of advanced colorectal cancer.  [facility] Oncol, 2005, Aug 1; 23(22): 5188-41.  For FOLFIRI and FOLFOX respectively, the median to to progression was 7 months versus 7 months; duration of response was 9 versus 10 months; overall survival was 14 versus 15 months and overall response rates were 31% versus 34%. Per NCCN Guidelines 2.2015, we recommended FOLFOX for initial therapy for her colon cancer pending review of her KRAS/NRAS mutation status (if wild type, can add cetuximab or panitumumab as noted above).   Other potential therapies if progression is FOLFIRI, 5-FU/LV, CapeOx or capecitabine.   We plan to start modified FOLFOX  6 q 14 day until progression or unacceptable  toxicity starting on 07/09/2012 (Day #1) due to receipt of her port on 06/25/2013 and subsequent holiday schedule. Her next cycle is scheduled for today (07/23/2012).  It  consists of the following:    Day # 1 Oxaliplatin 85 mg/m2 for 2 hours                                     Leucovorin 400 mg/m2 for 2 hours before 5-FU d1    Fluorouracil 400 mg/m2 once starting 2.5 After treatment start time  Days #1/2  Fluorouracil 2,400 mg/m2 for 46 hours  Day #3  Pump D/C  The reference is from Hochster, HS et al. Safety and efficacy of oxaliplatin and fluoropyrimidine regimens with or without bevacizumab as first-line treatment of metastatic colorectal cancer: results of the TREE Study. J Clin Oncol 2008; 66:0630.  In addition, we provided an in depth discussion concerning the indications and side-effects of therapy.  Side-effects includes but is not limited to bone marrow suppression (i.e., anemia, thrombocytopenia or leukopenia which can lead to life threatening infections), nausea or vomiting, gastrointestinal effects including diarrhea and electrolyte imbalances, neurotoxicity mainly from oxaliplatinum, liver and/or  kidney dysfunction.  We will not provide neulasta unless she has febrile neutropenia as risk of febrile neutropenia is less than 5%.  We will follow labs prior to chemotherapy.  She understood the indications, benefits and risk of therapy and chose to proceed.  -- We will refer to nutrtional based upon her request to continue to optimize her nutritional status based on above.   2. Family history of Colon cancer. --Based on her age and family history (sister deceased at age 41 from colon cancer), referral for genetic testing for Lynch syndrome, i.e. MSI or IHC, would be appropriate. We made a referral to genetics. Her tumor was negative for lynch syndrome.  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  --Parathyroid hormone elevated 192.5 and parathyroid hormone-related peptide pending. PTH  is generally low when hypercalcemia is secondary to malignancy. In primary PTH, it is elevated.  We counseled continue aggressive fluid hydration.  She was instructed to report to emergency room with worsening constipation or confusion.   We referred to endocrinology for further evaluation.   4. Vitamin D deficency due poor intake of lack of sun exposure -- Vitamin D level 19.  We will replete on her next visit.   5. Follow-up. --Patient instructed to follow-up 2 weeks for labs and consideration of next chemotherapy cycle.   All questions were answered. The patient knows to call the clinic with any problems, questions or concerns. We can certainly see the patient much sooner if necessary.  I spent 15 minutes counseling the patient face to face. The total time spent in the appointment was 25 minutes.    Govind Furey, MD 07/24/2013 8:19 AM

## 2013-07-24 NOTE — ED Notes (Signed)
Spoke with CT personnel regarding IV need. Per CT 20g IV is required for this exam.

## 2013-07-24 NOTE — Discharge Instructions (Signed)
Workup for the chest pain now without evidence of any acute cardiac abnormality no evidence of pneumonia no evidence of significant pulmonary edema no evidence of pneumothorax. Also no evidence of pulmonary embolism by CT angios. Suspect that it's chest wall in nature. Patient's labs without any significant abnormalities. Will treat with pain control and antinausea medicine. Keep your followup with your hematology oncology doctor tomorrow as scheduled. Return for any newer worse symptoms. Continue take your pain medicine and antinausea medicine to have at home.

## 2013-07-24 NOTE — ED Provider Notes (Signed)
CSN: [id number]     Arrival date & time 07/24/13  1619 History   First MD Initiated Contact with Patient 07/24/13 1743     Chief Complaint  Patient presents with  . Chest Pain   (Consider location/radiation/quality/duration/timing/severity/associated sxs/prior Treatment) The history is provided by the patient and the spouse.   63 year old female brought in by family member. Patient is followed by hematology oncology at [facility] long or advanced colon CA. Currently undergoing chemotherapy. Patient with onset of chest pains left-sided chest 8:00 this morning into left arm described as tight vomited x2 no blood and nauseated. At its worse chest pain has been 10 out of 10. Nonradiating to the back. Patient denies any fever. Pain described as a tightness and sharp.  Past Medical History  Diagnosis Date  . Colon cancer   . Anxiety    Past Surgical History  Procedure Laterality Date  . Abdominal hysterectomy  2005  . Cesarean section  1976  . Flexible sigmoidoscopy N/A 05/25/2013    Procedure: FLEXIBLE SIGMOIDOSCOPY;  Surgeon: [name], MD;  Location: [facility];  Service: Endoscopy;  Laterality: N/A;  needs floro  . Colonic stent placement N/A 05/25/2013    Procedure: COLONIC STENT PLACEMENT;  Surgeon: [name], MD;  Location: [facility];  Service: Endoscopy;  Laterality: N/A;  . Portacath placement Right 06/25/2013    Procedure: ULTRA SOUND GUIDED INSERTION PORT-A-CATH;  Surgeon: [name], MD;  Location: [place];  Service: General;  Laterality: Right;   Family History  Problem Relation Age of Onset  . Colon cancer Sister    History  Substance Use Topics  . Smoking status: Former Smoker -- 1 years    Quit date: 05/24/1971  . Smokeless tobacco: Never Used  . Alcohol Use: No   OB History   Grav Para Term Preterm Abortions TAB SAB Ect Mult Living                 Review of Systems  Constitutional: Negative for fever.  HENT: Negative for  congestion.   Eyes: Negative for redness.  Respiratory: Positive for shortness of breath.   Cardiovascular: Positive for chest pain.  Gastrointestinal: Positive for nausea, vomiting and abdominal pain.  Genitourinary: Negative for dysuria.  Musculoskeletal: Positive for back pain.  Skin: Negative for rash.  Neurological: Negative for headaches.  Hematological: Does not bruise/bleed easily.  Psychiatric/Behavioral: Negative for confusion.    Allergies  Codeine  Home Medications   Current Outpatient Rx  Name  Route  Sig  Dispense  Refill  . Cyanocobalamin (VITAMIN B 12 PO)   Oral   Take 1 tablet by mouth daily.         Marland Kitchen dexamethasone (DECADRON) 4 MG tablet      Take daily starting the day after chemotherapy for 2 days. Take with food.   30 tablet   1   . lidocaine-prilocaine (EMLA) cream   Topical   Apply 1 application topically as needed. Apply to port site one hour before treatment and cover with plastic wrap.   30 g   3   . ondansetron (ZOFRAN) 8 MG tablet   Oral   Take 1 tablet (8 mg total) by mouth 2 (two) times daily. Take two times a day starting the day after chemo for 2 days. Then take two times a day as needed for nausea or vomiting.   30 tablet   1   . polyethylene glycol (MIRALAX / GLYCOLAX) packet  Oral   Take 34 g by mouth daily as needed for mild constipation or moderate constipation. Is using prn         . Potassium (POTASSIMIN PO)   Oral   Take 1 tablet by mouth daily.         . prochlorperazine (COMPAZINE) 10 MG tablet   Oral   Take 1 tablet (10 mg total) by mouth every 6 (six) hours as needed (Nausea or vomiting).   30 tablet   1   . ranitidine (ZANTAC) 150 MG tablet   Oral   Take 150 mg by mouth daily as needed for heartburn.          BP 119/65  Pulse 91  Temp(Src) 98.6 F (37 C)  Resp 12  Wt 148 lb 9 oz (67.388 kg)  SpO2 100% Physical Exam  Nursing note and vitals reviewed. Constitutional: She is oriented to person,  place, and time. She appears well-developed and well-nourished. No distress.  HENT:  Head: Normocephalic and atraumatic.  Mouth/Throat: Oropharynx is clear and moist.  Eyes: Conjunctivae and EOM are normal. Pupils are equal, round, and reactive to light.  Neck: Normal range of motion.  Cardiovascular: Normal rate, regular rhythm and normal heart sounds.   No murmur heard. Pulmonary/Chest: Effort normal and breath sounds normal. No respiratory distress.  Abdominal: Soft. Bowel sounds are normal. There is no tenderness.  Neurological: She is alert and oriented to person, place, and time. No cranial nerve deficit. She exhibits normal muscle tone. Coordination normal.  Skin: Skin is warm. No erythema.    ED Course  Procedures (including critical care time) Labs Review Labs Reviewed  CBC - Abnormal; Notable for the following:    Hemoglobin 11.1 (*)    HCT 32.9 (*)    All other components within normal limits  BASIC METABOLIC PANEL - Abnormal; Notable for the following:    Glucose, Bld 127 (*)    Calcium 12.6 (*)    GFR calc non Af Amer 89 (*)    All other components within normal limits  PRO B NATRIURETIC PEPTIDE  TROPONIN I  POCT I-STAT TROPONIN I   Imaging Review Dg Chest 2 View  07/24/2013   CLINICAL DATA:  Colon cancer, left chest pain  EXAM: CHEST  2 VIEW  COMPARISON:  06/25/2013  FINDINGS: Right IJ power port catheter tip mid SVC. Normal heart size and vascularity. Lungs clear. No focal pneumonia, collapse or consolidation. No edema, effusion or pneumothorax. Previously described left lower lobe 5 mm nodule is not visualized by plain radiography. Trachea is midline.  IMPRESSION: No acute chest finding   Electronically Signed   By: Daryll Brod M.D.   On: 07/24/2013 17:04   Ct Angio Chest Pe W/cm &/or Wo Cm  07/24/2013   CLINICAL DATA:  Left chest pain and shortness of breath  EXAM: CT ANGIOGRAPHY CHEST WITH CONTRAST  TECHNIQUE: Multidetector CT imaging of the chest was performed  using the standard protocol during bolus administration of intravenous contrast. Multiplanar CT image reconstructions including MIPs were obtained to evaluate the vascular anatomy.  CONTRAST:  41mL OMNIPAQUE IOHEXOL 350 MG/ML SOLN  COMPARISON:  07/24/2013 chest x-ray, 06/12/2013 CT 8  FINDINGS: Visualized pulmonary arteries are patent. No significant filling defect or pulmonary embolus demonstrated by CTA. Normal heart size. No pericardial or pleural effusion. Intact thoracic aorta. Negative for dissection or aneurysm. Major branch vessels are patent. No adenopathy. Right IJ port catheter tip lower SVC. Persistent left superior intercostal vein  noted along the left mediastinum, a normal variant.  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 No acute upper abdominal finding.  Lung windows demonstrate mild patchy lower lobe ground-glass attenuation bilaterally. This is nonspecific but can be seen with mild alveolitis versus early edema. No significant interstitial process otherwise. No collapse or consolidation. Trachea and central airways are patent. Stable 6 mm nodule in the right lower lobe posteriorly, image 69. No enlarging pulmonary nodule or mass evident.  No acute osseous finding. Degenerative changes of the lower cervical spine.  Review of the MIP images confirms the above findings.  IMPRESSION: Negative for significant acute pulmonary embolus.  No acute intra thoracic finding  Mild bibasilar ground-glass attenuation can be seen with alveolitis versus early edema.  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  Stable 6 mm left lower lobe nodule   Electronically Signed   By: Daryll Brod M.D.   On: 07/24/2013 20:31    EKG Interpretation   None      Date: 07/24/2013  Rate: 96  Rhythm: normal sinus rhythm  QRS Axis: normal  Intervals: normal   ST/T Wave abnormalities: nonspecific T wave changes  Conduction Disutrbances:first-degree A-V block   Narrative Interpretation:   Old EKG Reviewed: none available Correction no first degree AV block.   Date: 07/24/2013  Rate: 98  Rhythm: normal sinus rhythm and premature ventricular contractions (PVC)  QRS Axis: normal  Intervals: normal  ST/T Wave abnormalities: nonspecific T wave changes  Conduction Disutrbances:none  Narrative Interpretation:   Old EKG Reviewed: unchanged No acute change on this EKG is the second compared to the first. Second EKG was done at [facility]   1. Chest wall pain    Patient with history of advanced colon cancer. Currently undergoing chemotherapy. Patient presents with the chest pain started this morning at 8:00 left-sided associated with 2 episodes of vomiting and nausea patient with extensive workup for cardiac negative clonus x2 EKGs x2 without any acute events. Seek TNG without evidence of pneumothorax pneumonia or pulmonary edema. Suspect that this is more chest wall pain. Patient has followup with hematologist oncologist in the morning. Will discharge home patient improved here with pain medicine and antinausea medicine. Patient has pain medicine and nausea medicine at home.    Mervin Kung, MD 07/25/13 [phone]

## 2013-07-24 NOTE — Telephone Encounter (Signed)
Pt called and states she had chemo yesterday. She is doing well just has some heartburn today. She just wanted to see what she can take. I explained she can use tums or over the counter for heart burn. She will try this and if this does not she will cal me back.

## 2013-07-25 ENCOUNTER — Telehealth: Payer: Self-pay | Admitting: Internal Medicine

## 2013-07-25 ENCOUNTER — Ambulatory Visit: Payer: Medicaid Other

## 2013-07-25 ENCOUNTER — Ambulatory Visit (HOSPITAL_BASED_OUTPATIENT_CLINIC_OR_DEPARTMENT_OTHER): Payer: Medicaid Other

## 2013-07-25 ENCOUNTER — Other Ambulatory Visit: Payer: Self-pay | Admitting: Internal Medicine

## 2013-07-25 ENCOUNTER — Encounter: Payer: Self-pay | Admitting: Internal Medicine

## 2013-07-25 VITALS — BP 109/57 | HR 110 | Temp 99.0°F | Resp 18

## 2013-07-25 VITALS — BP 96/59 | HR 90 | Temp 97.6°F

## 2013-07-25 DIAGNOSIS — C19 Malignant neoplasm of rectosigmoid junction: Secondary | ICD-10-CM

## 2013-07-25 DIAGNOSIS — R0789 Other chest pain: Secondary | ICD-10-CM

## 2013-07-25 DIAGNOSIS — R110 Nausea: Secondary | ICD-10-CM

## 2013-07-25 DIAGNOSIS — C787 Secondary malignant neoplasm of liver and intrahepatic bile duct: Secondary | ICD-10-CM

## 2013-07-25 DIAGNOSIS — C7889 Secondary malignant neoplasm of other digestive organs: Secondary | ICD-10-CM

## 2013-07-25 LAB — TROPONIN I: Troponin I: <0.3 ng/mL (ref <0.30)

## 2013-07-25 MED ORDER — ZOLEDRONIC ACID 4 MG/100ML IV SOLN
4.0000 mg | Freq: Once | INTRAVENOUS | Status: AC
  Administered 2013-07-25: 4 mg via INTRAVENOUS
  Filled 2013-07-25: qty 100

## 2013-07-25 MED ORDER — HEPARIN SOD (PORK) LOCK FLUSH 100 UNIT/ML IV SOLN
500.0000 [IU] | Freq: Once | INTRAVENOUS | Status: AC
  Administered 2013-07-25: 500 [IU] via INTRAVENOUS
  Filled 2013-07-25: qty 5

## 2013-07-25 MED ORDER — SODIUM CHLORIDE 0.9 % IJ SOLN
10.0000 mL | INTRAMUSCULAR | Status: DC | PRN
  Administered 2013-07-25: 10 mL via INTRAVENOUS
  Filled 2013-07-25: qty 10

## 2013-07-25 MED ORDER — ONDANSETRON 8 MG/50ML IVPB (CHCC)
8.0000 mg | Freq: Once | INTRAVENOUS | Status: AC
  Administered 2013-07-25: 8 mg via INTRAVENOUS

## 2013-07-25 MED ORDER — SODIUM CHLORIDE 0.9 % IJ SOLN
10.0000 mL | INTRAMUSCULAR | Status: DC | PRN
  Administered 2013-07-25: 10 mL
  Filled 2013-07-25: qty 10

## 2013-07-25 MED ORDER — SODIUM CHLORIDE 0.9 % IV SOLN
INTRAVENOUS | Status: AC
Start: 2013-07-25 — End: 2013-07-25
  Administered 2013-07-25: 14:00:00 via INTRAVENOUS

## 2013-07-25 MED ORDER — TRAMADOL HCL 50 MG PO TABS: 50.0000 mg | ORAL_TABLET | Freq: Four times a day (QID) | ORAL | Status: DC | PRN

## 2013-07-25 MED ORDER — OMEPRAZOLE 40 MG PO CPDR: 40.0000 mg | DELAYED_RELEASE_CAPSULE | Freq: Every day | ORAL | Status: DC

## 2013-07-25 MED ORDER — ONDANSETRON 8 MG/NS 50 ML IVPB
INTRAVENOUS | Status: AC
  Filled 2013-07-25: qty 8

## 2013-07-25 NOTE — Progress Notes (Signed)
I evaluated the patient in infusion.  She states the chest pain is better but is intermittent.  She did not report any aggravating factors. She stated the pain medication with dilaudid helped.  Zantac did not help.  She denies belching or water brush.  She was seen and evaluated in the ER yesterday without ST elevations.  Her troponin was negative.    PLAN: Likely atypical chest pain. --Provided a prescription of omeprazol 40 mg daily. --Provided NS 1,000 mL over 2 hours. --For her hypercalcemia, we will provide zometa 4 mg IV x one. --Check a 3 rd Troponin to rule out ACS though unlikely at this point.

## 2013-07-25 NOTE — Patient Instructions (Addendum)
Dehydration, Adult Dehydration is when you lose more fluids from the body than you take in. Vital organs like the kidneys, brain, and heart cannot function without a proper amount of fluids and salt. Any loss of fluids from the body can cause dehydration.  CAUSES   Vomiting.  Diarrhea.  Excessive sweating.  Excessive urine output.  Fever. SYMPTOMS  Mild dehydration  Thirst.  Dry lips.  Slightly dry mouth. Moderate dehydration  Very dry mouth.  Sunken eyes.  Skin does not bounce back quickly when lightly pinched and released.  Dark urine and decreased urine production.  Decreased tear production.  Headache. Severe dehydration  Very dry mouth.  Extreme thirst.  Rapid, weak pulse (more than 100 beats per minute at rest).  Cold hands and feet.  Not able to sweat in spite of heat and temperature.  Rapid breathing.  Blue lips.  Confusion and lethargy.  Difficulty being awakened.  Minimal urine production.  No tears. DIAGNOSIS  Your caregiver will diagnose dehydration based on your symptoms and your exam. Blood and urine tests will help confirm the diagnosis. The diagnostic evaluation should also identify the cause of dehydration. TREATMENT  Treatment of mild or moderate dehydration can often be done at home by increasing the amount of fluids that you drink. It is best to drink small amounts of fluid more often. Drinking too much at one time can make vomiting worse. Refer to the home care instructions below. Severe dehydration needs to be treated at the hospital where you will probably be given intravenous (IV) fluids that contain water and electrolytes. HOME CARE INSTRUCTIONS   Ask your caregiver about specific rehydration instructions.  Drink enough fluids to keep your urine clear or pale yellow.  Drink small amounts frequently if you have nausea and vomiting.  Eat as you normally do.  Avoid:  Foods or drinks high in sugar.  Carbonated  drinks.  Juice.  Extremely hot or cold fluids.  Drinks with caffeine.  Fatty, greasy foods.  Alcohol.  Tobacco.  Overeating.  Gelatin desserts.  Wash your hands well to avoid spreading bacteria and viruses.  Only take over-the-counter or prescription medicines for pain, discomfort, or fever as directed by your caregiver.  Ask your caregiver if you should continue all prescribed and over-the-counter medicines.  Keep all follow-up appointments with your caregiver. SEEK MEDICAL CARE IF:  You have abdominal pain and it increases or stays in one area (localizes).  You have a rash, stiff neck, or severe headache.  You are irritable, sleepy, or difficult to awaken.  You are weak, dizzy, or extremely thirsty. SEEK IMMEDIATE MEDICAL CARE IF:   You are unable to keep fluids down or you get worse despite treatment.  You have frequent episodes of vomiting or diarrhea.  You have blood or green matter (bile) in your vomit.  You have blood in your stool or your stool looks black and tarry.  You have not urinated in 6 to 8 hours, or you have only urinated a small amount of very dark urine.  You have a fever.  You faint. MAKE SURE YOU:   Understand these instructions.  Will watch your condition.  Will get help right away if you are not doing well or get worse. Document Released: 06/21/2005 Document Revised: 09/13/2011 Document Reviewed: 02/08/2011 [facility] Patient Information 2014 [place], [place]. Zoledronic Acid injection (Hypercalcemia, Oncology) What is this medicine? ZOLEDRONIC ACID (ZOE le dron ik AS id) lowers the amount of calcium loss from bone. It is used  to treat too much calcium in your blood from cancer. It is also used to prevent complications of cancer that has spread to the bone. This medicine may be used for other purposes; ask your health care provider or pharmacist if you have questions. COMMON BRAND NAME(S): Zometa What should I tell my health care  provider before I take this medicine? They need to know if you have any of these conditions: -aspirin-sensitive asthma -cancer, especially if you are receiving medicines used to treat cancer -dental disease or wear dentures -infection -kidney disease -receiving corticosteroids like dexamethasone or prednisone -an unusual or allergic reaction to zoledronic acid, other medicines, foods, dyes, or preservatives -pregnant or trying to get pregnant -breast-feeding How should I use this medicine? This medicine is for infusion into a vein. It is given by a health care professional in a hospital or clinic setting. Talk to your pediatrician regarding the use of this medicine in children. Special care may be needed. Overdosage: If you think you have taken too much of this medicine contact a poison control center or emergency room at once. NOTE: This medicine is only for you. Do not share this medicine with others. What if I miss a dose? It is important not to miss your dose. Call your doctor or health care professional if you are unable to keep an appointment. What may interact with this medicine? -certain antibiotics given by injection -NSAIDs, medicines for pain and inflammation, like ibuprofen or naproxen -some diuretics like bumetanide, furosemide -teriparatide -thalidomide This list may not describe all possible interactions. Give your health care provider a list of all the medicines, herbs, non-prescription drugs, or dietary supplements you use. Also tell them if you smoke, drink alcohol, or use illegal drugs. Some items may interact with your medicine. What should I watch for while using this medicine? Visit your doctor or health care professional for regular checkups. It may be some time before you see the benefit from this medicine. Do not stop taking your medicine unless your doctor tells you to. Your doctor may order blood tests or other tests to see how you are doing. Women should inform  their doctor if they wish to become pregnant or think they might be pregnant. There is a potential for serious side effects to an unborn child. Talk to your health care professional or pharmacist for more information. You should make sure that you get enough calcium and vitamin D while you are taking this medicine. Discuss the foods you eat and the vitamins you take with your health care professional. Some people who take this medicine have severe bone, joint, and/or muscle pain. This medicine may also increase your risk for jaw problems or a broken thigh bone. Tell your doctor right away if you have severe pain in your jaw, bones, joints, or muscles. Tell your doctor if you have any pain that does not go away or that gets worse. Tell your dentist and dental surgeon that you are taking this medicine. You should not have major dental surgery while on this medicine. See your dentist to have a dental exam and fix any dental problems before starting this medicine. Take good care of your teeth while on this medicine. Make sure you see your dentist for regular follow-up appointments. What side effects may I notice from receiving this medicine? Side effects that you should report to your doctor or health care professional as soon as possible: -allergic reactions like skin rash, itching or hives, swelling of the face, lips,  or tongue -anxiety, confusion, or depression -breathing problems -changes in vision -eye pain -feeling faint or lightheaded, falls -jaw pain, especially after dental work -mouth sores -muscle cramps, stiffness, or weakness -trouble passing urine or change in the amount of urine Side effects that usually do not require medical attention (report to your doctor or health care professional if they continue or are bothersome): -bone, joint, or muscle pain -constipation -diarrhea -fever -hair loss -irritation at site where injected -loss of appetite -nausea, vomiting -stomach  upset -trouble sleeping -trouble swallowing -weak or tired This list may not describe all possible side effects. Call your doctor for medical advice about side effects. You may report side effects to FDA at 1-800-FDA-1088. Where should I keep my medicine? This drug is given in a hospital or clinic and will not be stored at home. NOTE: This sheet is a summary. It may not cover all possible information. If you have questions about this medicine, talk to your doctor, pharmacist, or health care provider.  2014, [employer]. (2012-11-30 13:03:13)

## 2013-07-25 NOTE — Patient Instructions (Signed)

## 2013-07-25 NOTE — Telephone Encounter (Signed)
received call from desk nurse 1/20 asking that I check on endocrinology referral. new referral entered 07/24/13. s/w stephanie @ [facility] this morning [phone]). per stephanie she can see referral in EPIC and office will call pt w/appt and once appt is scheduled it will appear on pt appt desk.  desk for today given an update on this referral

## 2013-07-25 NOTE — Progress Notes (Signed)
Patient here to have port flushed and pump DC'd   Was at the emergency room last night.  C/o of nausea, vomiting and dizziness.   They gave her fluids and sent here home.  States that she isn't able to drink much, but is trying to get them in.  BP 96/59-90  Temp 97.6.  Drinking water at this time, but vomited up after 5 minutes.   Dr Juliann Mule ordered for her to get some IV fluids and antiemetics.

## 2013-07-26 ENCOUNTER — Encounter (HOSPITAL_COMMUNITY): Payer: Self-pay

## 2013-07-26 ENCOUNTER — Other Ambulatory Visit: Payer: Self-pay | Admitting: Internal Medicine

## 2013-07-26 ENCOUNTER — Telehealth: Payer: Self-pay

## 2013-07-26 NOTE — Telephone Encounter (Signed)
Lab results from [facility] received and forwarded to Dr Juliann Mule. Copy placed to be scanned into chart.

## 2013-07-27 ENCOUNTER — Ambulatory Visit (INDEPENDENT_AMBULATORY_CARE_PROVIDER_SITE_OTHER): Payer: Medicaid Other | Admitting: Endocrinology

## 2013-07-27 ENCOUNTER — Encounter: Payer: Self-pay | Admitting: Endocrinology

## 2013-07-27 DIAGNOSIS — R634 Abnormal weight loss: Secondary | ICD-10-CM

## 2013-07-27 LAB — TSH: TSH: 1.26 u[IU]/mL (ref 0.35–5.50)

## 2013-07-27 LAB — T4, FREE: FREE T4: 1.14 ng/dL (ref 0.60–1.60)

## 2013-07-27 NOTE — Patient Instructions (Signed)
Do not take any vitamin D or calcium, drink plenty of fluids

## 2013-07-27 NOTE — Progress Notes (Signed)
[name] [name]   Chief complaint: High calcium  History of Present Illness:  The patient was found to have high calcium level in 11/14 when she was first being evaluated for her colon cancer Patient says that she has not had any previous regular visits with physicians or lab work Her calcium levels have been persistently high and have ranged between 12.1-12.7 since last month  She has no history of fragility fractures, renal insufficiency, kidney stones, sarcoidosis, known thyroid disease.  She had a recent chest x-ray and CT scan of chest this week She does not have any decreased appetite or nausea except when she has chemotherapy Today she had lost about 5 pounds Previously was taking vitamin D but has stopped this about 2 weeks ago  Because of her high calcium level she was given zoledronic acid IV, 4 mg on 1/23   Lab Results  Component Value Date   CALCIUM 12.6* 07/24/2013   CALCIUM 12.1* 07/23/2013   CALCIUM 12.1* 07/02/2013   CALCIUM 12.6* 06/20/2013   CALCIUM 12.7* 06/04/2013   CALCIUM 10.8* 05/26/2013   CALCIUM 11.0* 05/25/2013   CALCIUM 12.0* 05/23/2013   CALCIUM 12.4* 05/22/2013    Prior serologic and radiologic studies have included: Lab Results  Component Value Date   PTH 192.5* 05/26/2013   CALCIUM 12.6* 07/24/2013   CAION 1.62* 05/24/2013   PHOS 1.4* 05/26/2013       Medication List       This list is accurate as of: 07/27/13 11:42 AM.  Always use your most recent med list.               dexamethasone 4 MG tablet  Commonly known as:  DECADRON  Take daily starting the day after chemotherapy for 2 days. Take with food.     lidocaine-prilocaine cream  Commonly known as:  EMLA  Apply 1 application topically as needed. Apply to port site one hour before treatment and cover with plastic wrap.     omeprazole 40 MG capsule  Commonly known as:  PRILOSEC  Take 1 capsule (40 mg total) by mouth daily.     ondansetron 8 MG tablet  Commonly known as:   ZOFRAN  Take 1 tablet (8 mg total) by mouth 2 (two) times daily. Take two times a day starting the day after chemo for 2 days. Then take two times a day as needed for nausea or vomiting.     polyethylene glycol packet  Commonly known as:  MIRALAX / GLYCOLAX  Take 34 g by mouth daily as needed for mild constipation or moderate constipation. Is using prn     POTASSIMIN PO  Take 1 tablet by mouth daily.     traMADol 50 MG tablet  Commonly known as:  ULTRAM  Take 1 tablet (50 mg total) by mouth every 6 (six) hours as needed for moderate pain.     VITAMIN B 12 PO  Take 1 tablet by mouth daily.        Allergies:  Allergies  Allergen Reactions  . Codeine Itching and Nausea And Vomiting    Past Medical History  Diagnosis Date  . Colon cancer   . Anxiety     Past Surgical History  Procedure Laterality Date  . Abdominal hysterectomy  2005  . Cesarean section  1976  . Flexible sigmoidoscopy N/A 05/25/2013    Procedure: FLEXIBLE SIGMOIDOSCOPY;  Surgeon: [name], MD;  Location: [facility];  Service: Endoscopy;  Laterality: N/A;  needs floro  .  Colonic stent placement N/A 05/25/2013    Procedure: COLONIC STENT PLACEMENT;  Surgeon: [name], MD;  Location: [facility];  Service: Endoscopy;  Laterality: N/A;  . Portacath placement Right 06/25/2013    Procedure: ULTRA SOUND GUIDED INSERTION PORT-A-CATH;  Surgeon: [name], MD;  Location: [facility];  Service: General;  Laterality: Right;    Family History  Problem Relation Age of Onset  . Colon cancer Sister     Social History:  reports that she quit smoking about 42 years ago. She has never used smokeless tobacco. She reports that she does not drink alcohol or use illicit drugs.   REVIEW of SYSTEMS  She has had problems with hot flashes Does not usually have shakiness or heat intolerance Recently she has been feeling her heart beating rapidly at times She thinks she has lost about 5  pounds Appetite is normal recently No history of hypertension No history of diabetes No history of swelling of her feet Her bowel habits are normal recently  EXAM:  BP 116/78  Pulse 120  Temp(Src) 98.3 F (36.8 C)  Resp 12  Ht 5' 6.25" (1.683 m)  Wt 143 lb 11.2 oz (65.182 kg)  BMI 23.01 kg/m2  SpO2 98%  GENERAL: Averagely built and nourished  No pallor, clubbing, lymphadenopathy or edema.   Skin:  no rash or pigmentation. Her hands and not diaphoretic or unusually warm  EYES:  Externally normal. No lid lag or stare  ENT: Oral mucosa and tongue normal.  THYROID:  Not palpable.  HEART:  Normal S1 and S2; no murmur or click.  CHEST:  Normal shape Lungs:   Vescicular breath sounds heard equally.  No crepitations/ wheeze.  ABDOMEN:  No distention.  Liver and spleen not palpable.  No other mass or tenderness.  NEUROLOGICAL: She has a mild fine tremor present.Reflexes are brisk bilaterally at biceps  SPINE AND JOINTS:  Normal.  Assessment/Plan:   HYPERCALCEMIA likely to be related to primary hyperparathyroidism because of her elevated PTH level and relatively low  level of PTH RP She does have significantly high calcium levels but is currently asymptomatic with no history of nausea, decreased appetite or drowsiness Have discussed the case with Dr. Charlton Amor today and agree that since she had Zometa 2 days ago they should prevent any further rise in calcium and improve the level slowly. Does not need any aggressive treatment since she is not symptomatic  Today she does appear to be somewhat tachycardic and tremulous and will need to rule out hyperthyroidism   RECOMMENDATIONS:  Check thyroid level to rule out hyperthyroidism  Parathyroid scan will be done to look for an adenoma  She has a positive parathyroid scan she will be referred for minimally invasive surgery  Advised patient not to take any calcium or vitamin D supplements at present   [facility] 07/27/2013,  11:42 AM

## 2013-07-30 ENCOUNTER — Other Ambulatory Visit (HOSPITAL_BASED_OUTPATIENT_CLINIC_OR_DEPARTMENT_OTHER): Payer: Medicaid Other

## 2013-07-30 DIAGNOSIS — C19 Malignant neoplasm of rectosigmoid junction: Secondary | ICD-10-CM

## 2013-07-30 LAB — BASIC METABOLIC PANEL (CC13)
Anion Gap: 7 mEq/L (ref 3–11)
BUN: 11 mg/dL (ref 7.0–26.0)
CHLORIDE: 109 meq/L (ref 98–109)
CO2: 21 mEq/L — ABNORMAL LOW (ref 22–29)
Calcium: 10.1 mg/dL (ref 8.4–10.4)
Creatinine: 0.7 mg/dL (ref 0.6–1.1)
Glucose: 106 mg/dl (ref 70–140)
Potassium: 4.1 mEq/L (ref 3.5–5.1)
Sodium: 137 mEq/L (ref 136–145)

## 2013-07-30 LAB — CBC WITH DIFFERENTIAL/PLATELET
BASO%: 0.7 % (ref 0.0–2.0)
Basophils Absolute: 0 10*3/uL (ref 0.0–0.1)
EOS%: 5 % (ref 0.0–7.0)
Eosinophils Absolute: 0.2 10*3/uL (ref 0.0–0.5)
HCT: 29.4 % — ABNORMAL LOW (ref 34.8–46.6)
HGB: 9.9 g/dL — ABNORMAL LOW (ref 11.6–15.9)
LYMPH%: 25.4 % (ref 14.0–49.7)
MCH: 27.9 pg (ref 25.1–34.0)
MCHC: 33.6 g/dL (ref 31.5–36.0)
MCV: 83.1 fL (ref 79.5–101.0)
MONO#: 0.4 10*3/uL (ref 0.1–0.9)
MONO%: 9.1 % (ref 0.0–14.0)
NEUT#: 2.7 10*3/uL (ref 1.5–6.5)
NEUT%: 59.8 % (ref 38.4–76.8)
Platelets: 241 10*3/uL (ref 145–400)
RBC: 3.54 10*6/uL — ABNORMAL LOW (ref 3.70–5.45)
RDW: 14.2 % (ref 11.2–14.5)
WBC: 4.6 10*3/uL (ref 3.9–10.3)
lymph#: 1.2 10*3/uL (ref 0.9–3.3)

## 2013-08-01 ENCOUNTER — Other Ambulatory Visit: Payer: Self-pay | Admitting: Medical Oncology

## 2013-08-06 ENCOUNTER — Ambulatory Visit: Payer: Medicaid Other | Admitting: Nutrition

## 2013-08-06 ENCOUNTER — Other Ambulatory Visit (HOSPITAL_BASED_OUTPATIENT_CLINIC_OR_DEPARTMENT_OTHER): Payer: Medicaid Other

## 2013-08-06 ENCOUNTER — Other Ambulatory Visit: Payer: Self-pay | Admitting: *Deleted

## 2013-08-06 ENCOUNTER — Telehealth: Payer: Self-pay | Admitting: Internal Medicine

## 2013-08-06 ENCOUNTER — Ambulatory Visit (HOSPITAL_BASED_OUTPATIENT_CLINIC_OR_DEPARTMENT_OTHER): Payer: Medicaid Other | Admitting: Internal Medicine

## 2013-08-06 ENCOUNTER — Ambulatory Visit (HOSPITAL_BASED_OUTPATIENT_CLINIC_OR_DEPARTMENT_OTHER): Payer: Medicaid Other

## 2013-08-06 ENCOUNTER — Other Ambulatory Visit: Payer: Self-pay | Admitting: Internal Medicine

## 2013-08-06 VITALS — BP 133/70 | HR 107 | Temp 98.3°F | Resp 18 | Ht 66.25 in | Wt 148.9 lb

## 2013-08-06 DIAGNOSIS — C787 Secondary malignant neoplasm of liver and intrahepatic bile duct: Secondary | ICD-10-CM

## 2013-08-06 DIAGNOSIS — C19 Malignant neoplasm of rectosigmoid junction: Secondary | ICD-10-CM

## 2013-08-06 DIAGNOSIS — Z5111 Encounter for antineoplastic chemotherapy: Secondary | ICD-10-CM

## 2013-08-06 DIAGNOSIS — C7889 Secondary malignant neoplasm of other digestive organs: Secondary | ICD-10-CM

## 2013-08-06 DIAGNOSIS — Z800 Family history of malignant neoplasm of digestive organs: Secondary | ICD-10-CM

## 2013-08-06 LAB — COMPREHENSIVE METABOLIC PANEL (CC13)
ALBUMIN: 3 g/dL — AB (ref 3.5–5.0)
ALT: 12 U/L (ref 0–55)
AST: 18 U/L (ref 5–34)
Alkaline Phosphatase: 72 U/L (ref 40–150)
Anion Gap: 8 mEq/L (ref 3–11)
BUN: 10.8 mg/dL (ref 7.0–26.0)
CO2: 22 mEq/L (ref 22–29)
Calcium: 10.8 mg/dL — ABNORMAL HIGH (ref 8.4–10.4)
Chloride: 108 mEq/L (ref 98–109)
Creatinine: 0.7 mg/dL (ref 0.6–1.1)
Glucose: 87 mg/dl (ref 70–140)
POTASSIUM: 4.6 meq/L (ref 3.5–5.1)
Sodium: 138 mEq/L (ref 136–145)
Total Bilirubin: <0.2 mg/dL (ref 0.20–1.20)
Total Protein: 7.4 g/dL (ref 6.4–8.3)

## 2013-08-06 LAB — CBC WITH DIFFERENTIAL/PLATELET
BASO%: 0.6 % (ref 0.0–2.0)
BASOS ABS: 0 10*3/uL (ref 0.0–0.1)
EOS%: 2.7 % (ref 0.0–7.0)
Eosinophils Absolute: 0.1 10*3/uL (ref 0.0–0.5)
HEMATOCRIT: 30.9 % — AB (ref 34.8–46.6)
HGB: 10.1 g/dL — ABNORMAL LOW (ref 11.6–15.9)
LYMPH%: 25.8 % (ref 14.0–49.7)
MCH: 26.8 pg (ref 25.1–34.0)
MCHC: 32.7 g/dL (ref 31.5–36.0)
MCV: 82 fL (ref 79.5–101.0)
MONO#: 0.5 10*3/uL (ref 0.1–0.9)
MONO%: 9.8 % (ref 0.0–14.0)
NEUT%: 61.1 % (ref 38.4–76.8)
NEUTROS ABS: 3.2 10*3/uL (ref 1.5–6.5)
PLATELETS: 237 10*3/uL (ref 145–400)
RBC: 3.77 10*6/uL (ref 3.70–5.45)
RDW: 15.1 % — ABNORMAL HIGH (ref 11.2–14.5)
WBC: 5.2 10*3/uL (ref 3.9–10.3)
lymph#: 1.3 10*3/uL (ref 0.9–3.3)

## 2013-08-06 LAB — LACTATE DEHYDROGENASE (CC13): LDH: 220 U/L (ref 125–245)

## 2013-08-06 MED ORDER — DEXTROSE 5 % IV SOLN
85.0000 mg/m2 | Freq: Once | INTRAVENOUS | Status: AC
  Administered 2013-08-06: 150 mg via INTRAVENOUS
  Filled 2013-08-06: qty 30

## 2013-08-06 MED ORDER — LEUCOVORIN CALCIUM INJECTION 350 MG
402.0000 mg/m2 | Freq: Once | INTRAVENOUS | Status: AC
  Administered 2013-08-06: 700 mg via INTRAVENOUS
  Filled 2013-08-06: qty 35

## 2013-08-06 MED ORDER — DEXAMETHASONE SODIUM PHOSPHATE 10 MG/ML IJ SOLN
10.0000 mg | Freq: Once | INTRAMUSCULAR | Status: AC
  Administered 2013-08-06: 10 mg via INTRAVENOUS

## 2013-08-06 MED ORDER — ONDANSETRON 8 MG/50ML IVPB (CHCC)
8.0000 mg | Freq: Once | INTRAVENOUS | Status: AC
  Administered 2013-08-06: 8 mg via INTRAVENOUS

## 2013-08-06 MED ORDER — DEXTROSE 5 % IV SOLN
Freq: Once | INTRAVENOUS | Status: AC
  Administered 2013-08-06: 12:00:00 via INTRAVENOUS

## 2013-08-06 MED ORDER — DEXAMETHASONE SODIUM PHOSPHATE 10 MG/ML IJ SOLN
INTRAMUSCULAR | Status: AC
  Filled 2013-08-06: qty 1

## 2013-08-06 MED ORDER — FLUOROURACIL CHEMO INJECTION 2.5 GM/50ML
400.0000 mg/m2 | Freq: Once | INTRAVENOUS | Status: AC
  Administered 2013-08-06: 700 mg via INTRAVENOUS
  Filled 2013-08-06: qty 14

## 2013-08-06 MED ORDER — ONDANSETRON 8 MG/NS 50 ML IVPB
INTRAVENOUS | Status: AC
Start: 2013-08-06 — End: 2013-08-06
  Filled 2013-08-06: qty 8

## 2013-08-06 MED ORDER — SODIUM CHLORIDE 0.9 % IV SOLN
2400.0000 mg/m2 | INTRAVENOUS | Status: DC
  Administered 2013-08-06: 4200 mg via INTRAVENOUS
  Filled 2013-08-06: qty 84

## 2013-08-06 NOTE — Progress Notes (Signed)
[facility] OFFICE PROGRESS NOTE  [name] Foulk, MD [place] [address]  DIAGNOSIS: Colorectal cancer, stage IV - Plan: CBC with Differential, Comprehensive metabolic panel (Cmet) - [facility], CBC with Differential, Comprehensive metabolic panel (Cmet) - [facility], CEA, CT Abdomen Pelvis W Contrast, CANCELED: CT Chest W Contrast  Chief Complaint  Patient presents with  . Colorectal cancer, stage IV   CURRENT THERAPY:  FOLFOX started on Cycle #1, 07/09/2013. Cycle #1B, 07/23/2013.  Scheduled for cycle #2 on 08/06/2013.    INTERVAL HISTORY: [name] [name] 63 y.o. 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   On her original presentation,  she denied any hematochezia or melana  but reported intermittent blood in stool over the past several months. She also reported 3 episodes of loose bowel movements per day. She had an CT of her abdomen done revealing an area of bowel wall thickening around the rectosigmoid junction concerning for malignancy. In addition, lesions were noted over the left lobe of the liver. [facility] were consulted. She had colonoscopy with stent placement and biopsy on 05/25/2013. In addition, she had a MRI of the abdomen confirming above and there was also a 4.5 x 4 cm splenic mass, a 3 x 3.3 cm liver mass left lobe, suggestive of a metastatic cancer. She was found to have an elevated CEA of 225. Her pathology revealed invasive adenocarcinoma.  Of note, her history is notable for her sister with colon cancer who died at age 26. She had not had a screening colonoscopy. She reports a 10 lb weight lost over the past several months.   She was last seen by me on  07/23/2013. She completed a liver biopsy which confirmed metastatic disease  and she had that obtained as indicated below.  She also had a CT of chest which showed pulmonary nodules both less than 1 cm (see official radiology below).  She was seen by surgery and had a Port-a-cath placement of 06/25/2013.    Today, she reports her bowel movements are regular and that she is with minimal pain.  She denies any fevers or chills.  She also reports that her weight is stable with an adequate appetite.   She is very active and handles her basic, intermediate and advanced ADLs without difficulties. She stated she had nausea and vomiting the first 2 days of cycle #1A and cycle 1B.  Otherwise she did well.  She was seen by Dr. Dwyane Dee of endocrinology.   MEDICAL HISTORY: Past Medical History  Diagnosis Date  . Colon cancer   . Anxiety     INTERIM HISTORY: has Rectal mass; Colon obstruction; Hypercalcemia; Colorectal cancer, stage IV; Family history of colorectal cancer; Thrush, oral; and Tachycardia on her problem list.    ALLERGIES:  is allergic to codeine.  MEDICATIONS: has a current medication list which includes the following prescription(s): cyanocobalamin, dexamethasone, lidocaine-prilocaine, ondansetron, polyethylene glycol, potassium, tramadol, and omeprazole, and the following Facility-Administered Medications: fluorouracil (ADRUCIL) 4,200 mg in sodium chloride 0.9 % 150 mL chemo infusion, fluorouracil, leucovorin 700 mg in dextrose 5 % 250 mL infusion, and oxaliplatin (ELOXATIN) 150 mg in dextrose 5 % 500 mL chemo infusion.  SURGICAL HISTORY:  Past Surgical History  Procedure Laterality Date  . Abdominal hysterectomy  2005  . Cesarean section  1976  . Flexible sigmoidoscopy N/A 05/25/2013    Procedure: FLEXIBLE SIGMOIDOSCOPY;  Surgeon: [name], MD;  Location: [facility];  Service: Endoscopy;  Laterality: N/A;  needs floro  . Colonic stent placement N/A 05/25/2013    Procedure: COLONIC STENT PLACEMENT;  Surgeon: [name], MD;  Location: [facility];   Service: Endoscopy;  Laterality: N/A;  . Portacath placement Right 06/25/2013    Procedure: ULTRA SOUND GUIDED INSERTION PORT-A-CATH;  Surgeon: [name], MD;  Location: [facility];  Service: General;  Laterality: Right;   REVIEW OF SYSTEMS:   Constitutional: Denies fevers, chills or abnormal weight loss Eyes: Denies blurriness of vision Ears, nose, mouth, throat, and face: Denies mucositis or sore throat Respiratory: Denies cough, dyspnea or wheezes Cardiovascular: Denies palpitation, chest discomfort or lower extremity swelling Gastrointestinal:  Denies nausea, heartburn or change in bowel habits Skin: Denies abnormal skin rashes Lymphatics: Denies new lymphadenopathy or easy bruising Neurological:Denies numbness, tingling or new weaknesses Behavioral/Psych: Mood is stable, no new changes  All other systems were reviewed with the patient and are negative.  PHYSICAL EXAMINATION: ECOG PERFORMANCE STATUS: 0 - Asymptomatic  Blood pressure 133/70, pulse 107, temperature 98.3 F (36.8 C), temperature source Oral, resp. rate 18, height 5' 6.25" (1.683 m), weight 148 lb 14.4 oz (67.541 kg).  GENERAL:alert, no distress and comfortable; well-developed, well nourished.  SKIN: skin color, texture, turgor are normal, no rashes or significant lesions; + R Port a cath without tenderness  EYES: normal, Conjunctiva are pink and non-injected, sclera clear OROPHARYNX:no exudate, no erythema and lips, buccal mucosa, and tongue with whitish strips NECK: supple, thyroid normal size, non-tender, without nodularity LYMPH:  no palpable lymphadenopathy in the cervical, axillary or supraclavicular LUNGS: clear to auscultation and percussion with normal breathing effort HEART: Tachycardic with regular rhythm and no murmurs and no lower extremity edema ABDOMEN:abdomen soft, non-tender and normal bowel sounds; + mild distention  Musculoskeletal:no cyanosis of digits and no clubbing   NEURO: alert & oriented x 3 with fluent speech, no focal motor/sensory deficits  LABORATORY DATA: Results for orders placed in visit on 08/06/13 (from the past 48 hour(s))  CBC WITH DIFFERENTIAL     Status: Abnormal   Collection Time    08/06/13  9:41 AM      Result Value Range   WBC 5.2  3.9 - 10.3 10e3/uL   NEUT# 3.2  1.5 - 6.5 10e3/uL   HGB 10.1 (*) 11.6 - 15.9 g/dL   HCT 30.9 (*) 34.8 - 46.6 %   Platelets 237  145 - 400 10e3/uL   MCV 82.0  79.5 - 101.0 fL   MCH 26.8  25.1 - 34.0 pg   MCHC 32.7  31.5 - 36.0 g/dL   RBC 3.77  3.70 - 5.45 10e6/uL   RDW 15.1 (*) 11.2 - 14.5 %   lymph# 1.3  0.9 - 3.3 10e3/uL   MONO# 0.5  0.1 - 0.9 10e3/uL   Eosinophils Absolute 0.1  0.0 - 0.5 10e3/uL   Basophils Absolute 0.0  0.0 - 0.1 10e3/uL   NEUT% 61.1  38.4 - 76.8 %   LYMPH% 25.8  14.0 - 49.7 %   MONO% 9.8  0.0 - 14.0 %   EOS% 2.7  0.0 - 7.0 %   BASO% 0.6  0.0 - 2.0 %  COMPREHENSIVE METABOLIC PANEL (XV40)     Status: Abnormal   Collection Time    08/06/13  9:41 AM      Result  Value Range   Sodium 138  136 - 145 mEq/L   Potassium 4.6  3.5 - 5.1 mEq/L   Chloride 108  98 - 109 mEq/L   CO2 22  22 - 29 mEq/L   Glucose 87  70 - 140 mg/dl   BUN 10.8  7.0 - 26.0 mg/dL   Creatinine 0.7  0.6 - 1.1 mg/dL   Total Bilirubin <0.20  0.20 - 1.20 mg/dL   Alkaline Phosphatase 72  40 - 150 U/L   AST 18  5 - 34 U/L   ALT 12  0 - 55 U/L   Total Protein 7.4  6.4 - 8.3 g/dL   Albumin 3.0 (*) 3.5 - 5.0 g/dL   Calcium 10.8 (*) 8.4 - 10.4 mg/dL   Anion Gap 8  3 - 11 mEq/L  LACTATE DEHYDROGENASE (CC13)     Status: None   Collection Time    08/06/13  9:41 AM      Result Value Range   LDH 220  125 - 245 U/L    Labs:  Lab Results  Component Value Date   WBC 5.2 08/06/2013   HGB 10.1* 08/06/2013   HCT 30.9* 08/06/2013   MCV 82.0 08/06/2013   PLT 237 08/06/2013   NEUTROABS 3.2 08/06/2013      Chemistry      Component Value Date/Time   NA 138 08/06/2013 0941   NA 138 07/24/2013 1808   K 4.6 08/06/2013 0941    K 4.7 07/24/2013 1808   CL 101 07/24/2013 1808   CO2 22 08/06/2013 0941   CO2 23 07/24/2013 1808   BUN 10.8 08/06/2013 0941   BUN 20 07/24/2013 1808   CREATININE 0.7 08/06/2013 0941   CREATININE 0.76 07/24/2013 1808      Component Value Date/Time   CALCIUM 10.8* 08/06/2013 0941   CALCIUM 12.6* 07/24/2013 1808   ALKPHOS 72 08/06/2013 0941   ALKPHOS 89 05/22/2013 0930   AST 18 08/06/2013 0941   AST 26 05/22/2013 0930   ALT 12 08/06/2013 0941   ALT 15 05/22/2013 0930   BILITOT <0.20 08/06/2013 0941   BILITOT 0.2* 05/22/2013 0930     Basic Metabolic Panel:  Recent Labs Lab 08/06/13 0941  NA 138  K 4.6  CO2 22  GLUCOSE 87  BUN 10.8  CREATININE 0.7  CALCIUM 10.8*   GFR Estimated Creatinine Clearance: 69.8 ml/min (by C-G formula based on Cr of 0.7). Liver Function Tests:  Recent Labs Lab 08/06/13 0941  AST 18  ALT 12  ALKPHOS 72  BILITOT <0.20  PROT 7.4  ALBUMIN 3.0*   CBC:  Recent Labs Lab 08/06/13 0941  WBC 5.2  NEUTROABS 3.2  HGB 10.1*  HCT 30.9*  MCV 82.0  PLT 237   Microbiology No results found for this or any previous visit (from the past 240 hour(s)).  Studies:  No results found.   RADIOGRAPHIC STUDIES: CT CHEST WITH CONTRAST TECHNIQUE: ultidetector CT imaging of the chest was performed during  intravenous contrast administration. CONTRAST: 52m OMNIPAQUE IOHEXOL 300 MG/ML SOLN COMPARISON: 05/22/2013 FINDINGS: Hypodense hepatic and splenic masses noted. Left kidney upper pole 2 mm nonobstructive calculus. Small epicardial lymph nodes noted. No pathologic thoracic adenopathy. Vascular structures unremarkable. 4 x 5 mm noncalcified solid nodule, left lower lobe, image 30 of series 5, stable from the CT scan of 05/22/2013. 2 mm right middle lobe nodule, image 31 of series 5. No other nodules observed. Mild thoracic spondylosis. IMPRESSION: 1. 4 x 5 mm  pulmonary nodule, left lower lobe, noncalcified. 2 mm right middle lobe pulmonary nodule. Although quite likely benign and  too small for reliable PET-CT assessment or biopsy, these nodules merit attention on followup CT scans to exclude growth. 2. Scattered hepatic and splenic hypodense lesions as shown on recent abdomen workups. 3. Nonobstructive left nephrolithiasis.   Dg Pelvis 1-2 Views  05/25/2013   CLINICAL DATA:  Colon cancer.  Insertion of colonic stent.  EXAM: PELVIS - 1-2 VIEW  COMPARISON:  05/22/2013 CT.  Fluoroscopic time:  5 min and 30 seconds  FINDINGS: Four intraoperative C-arm views submitted for review after procedure. This reveals placement of a rectosigmoid stent with narrowing of the mid aspect of the stent. Contrast was instilled. On limited imaging, no extravasation detected.  IMPRESSION: Placement of a rectosigmoid stent with narrowing of the mid aspect of the stent.   Electronically Signed   By: Chauncey Cruel M.D.   On: 05/25/2013 10:55   Mr Abdomen W Wo Contrast  05/24/2013   CLINICAL DATA:  Abdominal pain, liver lesion on CT  EXAM: MRI ABDOMEN WITHOUT AND WITH CONTRAST  TECHNIQUE: Multiplanar multisequence MR imaging of the abdomen was performed both before and after the administration of intravenous contrast.  CONTRAST:  69m MULTIHANCE GADOBENATE DIMEGLUMINE 529 MG/ML IV SOLN  COMPARISON:  CT abdomen pelvis dated 05/22/2013  FINDINGS: 5 mm pulmonary nodule in the left lower lobe (series 3/image 7).  2.7 x 3.9 cm lesion in the medial segment left hepatic lobe (series 1102/image 36), corresponding to the CT abnormality, demonstrates heterogeneous/peripheral enhancement, no intracellular lipid, and restricted diffusion (series 400/image 23). This appearance is suspicious for metastasis.  Two additional hepatic lesions with characteristic peripheral nodular discontinuous enhancement of benign hemangiomas, as follows:  --7.7 x 5.0 cm lesion in the right hepatic lobe (series 1102/ image 44)  --1.9 x 2.4 cm lesion in the posterior right hepatic dome (series 1102/image 25)  Multiple additional tiny T2  hyperintense lesions likely reflect small cysts or hemangiomas (for example, series 3/images 15, 17, 19, 20, and 22), but are too small to characterize.  3.7 x 4.4 cm heterogeneously enhancing lesion along the superior spleen (series 13/ image 37), indeterminate, but worrisome for metastasis.  Pancreas and adrenal glands are within normal limits.  Gallbladder is unremarkable. No intrahepatic or extrahepatic ductal dilatation.  Kidneys are within normal limits.  No hydronephrosis.  No abdominal ascites.  No suspicious abdominal lymphadenopathy.  Multiple dilated loops of colon, possibly reflecting some degree of colonic obstruction related to known distal colonic mass on CT.  Enhancing T2 hyperintense lesion in the right T3 vertebral body (series 3/ image 37), favored to reflect a benign vertebral hemangioma.  IMPRESSION: 2.7 x 3.9 cm enhancing lesion in the medial segment left hepatic lobe, corresponding to the CT abnormality, suspicious for metastasis.  Two additional benign hemangiomas, as described above. Multiple additional tiny probable hepatic cysts or hemangiomas, too small to characterize.  3.7 x 4.4 cm enhancing lesion along the superior spleen, indeterminate but worrisome for metastasis.  Multiple dilated loops of colon, possibly reflecting some degree of colonic obstruction related to known distal colonic mass on CT.   Electronically Signed   By: SJulian HyM.D.   On: 05/24/2013 08:43   Ct Abdomen Pelvis W Contrast  05/24/2013   ADDENDUM REPORT: 05/24/2013 08:51  ADDENDUM: Addendum 05/24/2013 at 0849 hr  There is a 4.5 x 4 cm heterogeneously enhancing splenic mass which is well-circumscribed. When correlated with the MRI performed 05/24/2013  and the colonic findings, the appearance is concerning for metastatic disease.   Electronically Signed   By: Kathreen Devoid   On: 05/24/2013 08:51   05/24/2013   CLINICAL DATA:  Generalized abdominal pain  EXAM: CT ABDOMEN AND PELVIS WITH CONTRAST   TECHNIQUE: Multidetector CT imaging of the abdomen and pelvis was performed using the standard protocol following bolus administration of intravenous contrast.  CONTRAST:  52m OMNIPAQUE IOHEXOL 300 MG/ML  SOLN  COMPARISON:  None.  FINDINGS: There is a 5.6 mm left lower lobe pulmonary nodule (image 6/series 3).  There is a low-attenuation area in the left hepatic lobe adjacent to the falciform ligament measuring 3 x 3.3 cm with vessels coursing through this area of low-attenuation suggesting an area of focal fatty deposition. There is a 8.2 x 5.1 cm hypodense mass in the right hepatic lobe with peripheral nodular enhancement with progressive filling in on delayed phase imaging most consistent with a hemangioma. There is a smaller similar mass measuring 2 x 1.3 cm in the right hepatic lobe near the dome of the liver with peripheral nodular enhancement. There is no intrahepatic or extrahepatic biliary ductal dilatation. The gallbladder is normal. The spleen demonstrates no focal abnormality. There are nonobstructing left renal calculi with the largest measuring 9 mm. The right kidney, adrenal glands and pancreas are normal. The bladder is unremarkable.  The stomach, duodenum, small intestine, and large intestine demonstrate no contrast extravasation or dilatation. There is a large amount of stool throughout the colon. There is a normal caliber appendix in the right lower quadrant without periappendiceal inflammatory changes.There is a relative area of bowel wall thickening with shouldering of the proximal and distal margins, involving the rectosigmoid junction with slight haziness along the right lateral aspect of the bowel wall. There is no pneumoperitoneum, pneumatosis, or portal venous gas. There is no abdominal or pelvic free fluid. There is no lymphadenopathy.  The abdominal aorta is normal in caliber with atherosclerosis.  There are no lytic or sclerotic osseous lesions.  IMPRESSION: 1. There is a relative area  of bowel wall thickening with shouldering of the proximal and distal margins, involving the rectosigmoid junction with slight haziness along the right lateral aspect of the bowel wall. The area is concerning for malignancy. Recommend further evaluation with colonoscopy.  There is a large amount of stool throughout the colon proximal to this area.  2. There are 2 hypodense right hepatic mass is with peripheral nodular enhancement most consistent with hemangiomas.  3.  Nonobstructing left renal calculi.  4. There is a low-attenuation area in the left hepatic lobe adjacent to the falciform ligament measuring 3 x 3.3 cm with vessels coursing through this area of low-attenuation suggesting an area of focal fatty deposition. If there is further clinical concern recommend an MRI of the abdomen without and with intravenous contrast.  Electronically Signed: By: HKathreen DevoidOn: 05/22/2013 10:20   Dg C-arm 61-120 Min-no Report  05/25/2013   CLINICAL DATA: bowel obstruction   C-ARM 61-120 MINUTES  Fluoroscopy was utilized by the requesting physician.  No radiographic  interpretation.    PATHOLOGY: Diagnosis Rectum, biopsy, proximal - INVASIVE ADENOCARCINOMA. Microscopic Comment Microsatellite instability testing can be performed upon request. (HCL:caf 05/28/13) HAldona BarMD  Pathologist, Electronic Signature(Case signed 05/28/2013) Specimen(s) Obtained: Rectum, biopsy, proximal  Specimen Clinical Information stent colon mass stricture (kp)  Gross  Received in formalin are tan, soft tissue fragments that are submitted in toto. Number: five, Size: 0.1 to 0.5  cm, one block. (MM:gt, 05/25/13)  Diagnosis LIVER, FINE NEEDLE ASPIRATION, LEFT LOBE(SPECIMEN 1 OF 1 COLLECTED 06/12/13): MALIGNANT CELLS CONSISTENT WITH METASTATIC ADENOCARCINOMA OF COLONIC PRIMARY. Preliminary Diagnosis  ADEQUATE(HCL)  Aldona Bar MD Pathologist, Electronic Signature  (Case signed 06/13/2013) Specimen Clinical Information  colon  cancer, possible metastatic disease Source  Liver, Fine Needle Aspiration, left lobe (specimen 1 of 1 collected 06/12/13)  Gross  Specimen: Received is/are _ Prepared:1) 2 slides (1 quick stain) 2) 2 slides (1 quick stain) 3) 2 slides (1 quick stain). 30cc's of pink Cytolyt solution from needle rinses. (DB:BS:bs) # Smears: 6  # Concentration Technique Slides (i.e. ThinPrep): 1 # Cell Block: 1 Additional Studies: n/a  Mismatch Repair (MMR) Protein Immunohistochemistry (IHC) IHC Expression Result: MLH1: Preserved nuclear expression (greater 50% tumor expression) MSH2: Preserved nuclear expression (greater 50% tumor expression) MSH6: Preserved nuclear expression (greater 50% tumor expression) PMS2: Preserved nuclear expression (greater 50% tumor expression) * Internal control demonstrates intact nuclear expression Interpretation: NORMAL There is preserved expression of the major and minor MMR proteins. There is a very low probability that microsatellite instability (MSI) is present. However, certain clinically significant MMR protein mutations may result in preservation of nuclear expression. It is recommended that the preservation of protein expression be correlated with molecular based MSI testing. References: 1. Guidelines on Genetic Evaluation and Management of Lynch Syndrome: A Consensus Statement by the [place] Multi-Society Task Force on Colorectal Cancer Gae Dry. Sherlie Ban , MD, and others . Am Nicki [place] 2014; [phone]; doi: 10.1038/ajg.2014.186; published online 23 January 2013 2. Outcomes of screening endometrial cancer patients for Lynch syndrome by patient-administered checklist. Olena Heckle MS, and others. Gynecol Oncol 2013;131(3):619-623. Gretel Acre LI MD Pathologist, Electronic Signature ( Signed 06/11/2013)  ASSESSMENT: [name] [name] 63 y.o. female with a history of Colorectal cancer, stage IV - Plan: CBC with Differential, Comprehensive metabolic panel (Cmet) - [facility], CBC  with Differential, Comprehensive metabolic panel (Cmet) - [facility], CEA, CT Abdomen Pelvis W Contrast, CANCELED: CT Chest W Contrast   ASSESSMENT: 63 yo AAF with family history of colon cancer, admitted with obstructive mass s/p stent and biopsy on 11/21 consistent with metastatic colon cancer with mets to liver, spleen. ECOG 0. Elevated CEA.  PLAN:  1. 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   -- On prior visits, we reviewed her recent imaging and pathology in detail.  Based on imaging and pathology by liver biopsy, she is stage IV.  We discussed that this less likely to be curable.  To complete staging, we obtained a CT of Chest to look for additional EOD. It revealed 4 x 5 mm pulmonary nodule, left lower lobe, noncalcified. 2 mm right middle lobe pulmonary nodule which requires follow-up to exclude increase in size.  In addition, we will set up for an ultrasound-guided biopsy to confirm metastatic disease which was done and consistent with colon adenocarcinoma.   Prior biology of rectum as noted above.  We discussed case with pathology (Dr. Donato Heinz) who performed MSI testing on primary tumor sample which was negative for MSI;  We have requested additional testing KRAS, BRAF and NRAS testing on ultrasound-guided biopsy sample (given a 85% concordance rate between primary tumor and metastatic site).  We will add cetuximab or panitumumab if KRAS/NRAS wild type only. She had the KRAS mutation. (per NCCN guidelines).   --On prior visits, we  reviewed her treatment options including clinical trial enrollment with [facility], a phase II study of irinotecan dosing in metastatic colorectal  cancer (mCRC) patients receiving FOLFIRI + Bevacizumab (avastin); surgery followed by chemotherapy or vice/versa; chemotherapy alone, i.e., combinations to consider would be based on mutational status and functional status, i.e., FOLFOX.  However, given placement of stent and with discussion with surgery,  she will not be a candidate for avastatin.  Avastatin use in the setting of stent placement lead to perforation risk between 15 - 50% according to some clinical trials.  Baseline rate of perforation without stent placement is 5 to 6%.   Given this substantially increased risk of perforation, she will not be a candidate for [facility] 1317.   We then discussed FOLFOX and FOLFIRI as initial chemotherapy treatments.     --There is no difference in overall response rate, time to progression and overall survival for patients treated with FOLFIRI versus FOLFOX4 in the treatment of advanced colorectal cancer.  [facility] Clin Oncol, 2005, Aug 1; 23(22): 1749-44.  For FOLFIRI and FOLFOX respectively, the median to to progression was 7 months versus 7 months; duration of response was 9 versus 10 months; overall survival was 14 versus 15 months and overall response rates were 31% versus 34%. Per NCCN Guidelines 2.2015, we recommended FOLFOX for initial therapy for her colon cancer pending review of her KRAS/NRAS mutation status (if wild type, can add cetuximab or panitumumab as noted above).   Other potential therapies if progression is FOLFIRI, 5-FU/LV, CapeOx or capecitabine.   We plan to start modified FOLFOX  6 q 14 day until progression or unacceptable toxicity which started on 07/09/2012 (Day #1) due to receipt of her port on 06/25/2013 and subsequent holiday schedule. Her next cycle 2 is scheduled for today (08/06/2013).  It  consists of the following:    Day # 1 Oxaliplatin 85 mg/m2 for 2 hours                                     Leucovorin 400 mg/m2 for 2 hours before 5-FU d1    Fluorouracil 400 mg/m2 once starting 2.5 After treatment start time  Days #1/2  Fluorouracil 2,400 mg/m2 for 46 hours  Day #3  Pump D/C  The reference is from Hochster, HS et al. Safety and efficacy of oxaliplatin and fluoropyrimidine regimens with or without bevacizumab as first-line treatment of metastatic colorectal cancer:  results of the TREE Study. J Clin Oncol 2008; 96:7591.  In addition, we provided an in depth discussion concerning the indications and side-effects of therapy.  Side-effects includes but is not limited to bone marrow suppression (i.e., anemia, thrombocytopenia or leukopenia which can lead to life threatening infections), nausea or vomiting, gastrointestinal effects including diarrhea and electrolyte imbalances, neurotoxicity mainly from oxaliplatinum, liver and/or kidney dysfunction.  We will not provide neulasta unless she has febrile neutropenia as risk of febrile neutropenia is less than 5%.  We will follow labs prior to chemotherapy.  She understood the indications, benefits and risk of therapy and chose to proceed.  -- We will obtain a restaging CT of abdomen pelvis s/p 2 cycles of FOLFOX on 08/31/2013.   2. Family history of Colon cancer. --Based on her age and family history (sister deceased at age 12 from colon cancer), referral for genetic testing for Lynch syndrome, i.e. MSI or IHC, would be appropriate. We made a referral to genetics. Her tumor was negative for lynch syndrome.  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  --Parathyroid hormone elevated  192.5 and parathyroid hormone-related peptide pending. PTH is generally low when hypercalcemia is secondary to malignancy. In primary PTH, it is elevated.  We counseled continue aggressive fluid hydration.  She was instructed to report to emergency room with worsening constipation or confusion.   We referred to endocrinology for further evaluation. She was seen by Dr. Dwyane Dee.   4. Follow-up. --Patient instructed to follow-up 2 weeks for labs and 4 weeks prior to consideration of next chemotherapy cycle.   All questions were answered. The patient knows to call the clinic with any problems, questions or concerns. We can certainly see the patient much sooner if necessary.  I spent 15 minutes counseling the patient face to face. The total  time spent in the appointment was 25 minutes.    Tonae Livolsi, MD 08/06/2013 1:05 PM

## 2013-08-06 NOTE — Progress Notes (Signed)
Followup completed with patient in chemotherapy room.  Patient states she feels much better.  Patient reports she has nausea the day after her chemotherapy for 2 days.  She is unable to increase her oral intake for those 2 days.  Patient used to drink Ginger tea and enjoyed it.  Weight was documented as 148.9 pounds February 2, which is increased from 143.7 pounds January 23.    Nutrition diagnosis: Food and nutrition related knowledge deficit has improved.  Intervention: Patient educated on strategies for eating with nausea.  She was encouraged to take her nausea medications as prescribed by her physician.  Patient educated on trying Ginger te anda for nausea  I also provided patient with recipes for increased calories and protein for use after treatment with oxaliplatin.  Monitoring, evaluation, goals: Patient will tolerate adequate calories and protein to promote weight maintenance throughout treatment.  She will follow strategies for improving nausea.  Next visit: Monday, February 16, during chemotherapy.

## 2013-08-06 NOTE — Telephone Encounter (Signed)
Gave pt appt for lab,chemo and Md for february and MArch 201

## 2013-08-06 NOTE — Patient Instructions (Signed)
[facility] Discharge Instructions for Patients Receiving Chemotherapy  Today you received the following chemotherapy agents Oxaliplatin/Leucovorin/5FU  To help prevent nausea and vomiting after your treatment, we encourage you to take your nausea medication as prescribed.   If you develop nausea and vomiting that is not controlled by your nausea medication, call the clinic.   BELOW ARE SYMPTOMS THAT SHOULD BE REPORTED IMMEDIATELY:  *FEVER GREATER THAN 100.5 F  *CHILLS WITH OR WITHOUT FEVER  NAUSEA AND VOMITING THAT IS NOT CONTROLLED WITH YOUR NAUSEA MEDICATION  *UNUSUAL SHORTNESS OF BREATH  *UNUSUAL BRUISING OR BLEEDING  TENDERNESS IN MOUTH AND THROAT WITH OR WITHOUT PRESENCE OF ULCERS  *URINARY PROBLEMS  *BOWEL PROBLEMS  UNUSUAL RASH Items with * indicate a potential emergency and should be followed up as soon as possible.  Feel free to call the clinic you have any questions or concerns. The clinic phone number is [phone].    

## 2013-08-08 ENCOUNTER — Ambulatory Visit (HOSPITAL_BASED_OUTPATIENT_CLINIC_OR_DEPARTMENT_OTHER): Payer: Medicaid Other

## 2013-08-08 ENCOUNTER — Encounter: Payer: Self-pay | Admitting: Internal Medicine

## 2013-08-08 VITALS — BP 118/73 | HR 100 | Temp 97.8°F

## 2013-08-08 DIAGNOSIS — C19 Malignant neoplasm of rectosigmoid junction: Secondary | ICD-10-CM

## 2013-08-08 DIAGNOSIS — C787 Secondary malignant neoplasm of liver and intrahepatic bile duct: Secondary | ICD-10-CM

## 2013-08-08 MED ORDER — SODIUM CHLORIDE 0.9 % IJ SOLN
10.0000 mL | INTRAMUSCULAR | Status: DC | PRN
  Administered 2013-08-08: 10 mL
  Filled 2013-08-08: qty 10

## 2013-08-08 MED ORDER — HEPARIN SOD (PORK) LOCK FLUSH 100 UNIT/ML IV SOLN
500.0000 [IU] | Freq: Once | INTRAVENOUS | Status: AC | PRN
  Administered 2013-08-08: 500 [IU]
  Filled 2013-08-08: qty 5

## 2013-08-08 NOTE — Progress Notes (Signed)
Put disability form on nurse's desk. °

## 2013-08-09 ENCOUNTER — Telehealth: Payer: Self-pay

## 2013-08-09 NOTE — Telephone Encounter (Signed)
S/w pt that her disability papers were signed. She needs to complete and sign information on page 2. She said she will pick them up tomorrow. Papers placed with Thayer Headings in Rx binder.

## 2013-08-10 ENCOUNTER — Encounter: Payer: Self-pay | Admitting: *Deleted

## 2013-08-17 ENCOUNTER — Telehealth: Payer: Self-pay

## 2013-08-17 NOTE — Telephone Encounter (Signed)
Pt called stating her hands are getting darker and asking if that is permanent. Also that she has patchy on her tongue, bluish color, non painful. She uses biotene mouthwash. Asked her to show these to the nurse on Monday before next treatment. To call if she gets cracked painful hands, or painful tongue.

## 2013-08-20 ENCOUNTER — Ambulatory Visit: Payer: Medicaid Other | Admitting: Nutrition

## 2013-08-20 ENCOUNTER — Ambulatory Visit (HOSPITAL_BASED_OUTPATIENT_CLINIC_OR_DEPARTMENT_OTHER): Payer: Medicaid Other

## 2013-08-20 ENCOUNTER — Other Ambulatory Visit: Payer: Self-pay | Admitting: Internal Medicine

## 2013-08-20 VITALS — BP 136/49 | HR 94 | Temp 98.3°F | Resp 18

## 2013-08-20 DIAGNOSIS — C19 Malignant neoplasm of rectosigmoid junction: Secondary | ICD-10-CM

## 2013-08-20 DIAGNOSIS — C7889 Secondary malignant neoplasm of other digestive organs: Secondary | ICD-10-CM

## 2013-08-20 DIAGNOSIS — Z5111 Encounter for antineoplastic chemotherapy: Secondary | ICD-10-CM

## 2013-08-20 DIAGNOSIS — C787 Secondary malignant neoplasm of liver and intrahepatic bile duct: Secondary | ICD-10-CM

## 2013-08-20 LAB — CBC WITH DIFFERENTIAL/PLATELET
BASO%: 0.3 % (ref 0.0–2.0)
Basophils Absolute: 0 10*3/uL (ref 0.0–0.1)
EOS%: 2 % (ref 0.0–7.0)
Eosinophils Absolute: 0.1 10*3/uL (ref 0.0–0.5)
HCT: 32 % — ABNORMAL LOW (ref 34.8–46.6)
HGB: 10.3 g/dL — ABNORMAL LOW (ref 11.6–15.9)
LYMPH%: 23.3 % (ref 14.0–49.7)
MCH: 26.5 pg (ref 25.1–34.0)
MCHC: 32.2 g/dL (ref 31.5–36.0)
MCV: 82.5 fL (ref 79.5–101.0)
MONO#: 0.7 10*3/uL (ref 0.1–0.9)
MONO%: 11 % (ref 0.0–14.0)
NEUT#: 4 10*3/uL (ref 1.5–6.5)
NEUT%: 63.4 % (ref 38.4–76.8)
NRBC: 0 % (ref 0–0)
Platelets: 181 10*3/uL (ref 145–400)
RBC: 3.88 10*6/uL (ref 3.70–5.45)
RDW: 15.2 % — AB (ref 11.2–14.5)
WBC: 6.4 10*3/uL (ref 3.9–10.3)
lymph#: 1.5 10*3/uL (ref 0.9–3.3)

## 2013-08-20 LAB — COMPREHENSIVE METABOLIC PANEL (CC13)
ALK PHOS: 72 U/L (ref 40–150)
ALT: 19 U/L (ref 0–55)
ANION GAP: 6 meq/L (ref 3–11)
AST: 25 U/L (ref 5–34)
Albumin: 3.2 g/dL — ABNORMAL LOW (ref 3.5–5.0)
BUN: 12.1 mg/dL (ref 7.0–26.0)
CO2: 23 mEq/L (ref 22–29)
Calcium: 12.1 mg/dL — ABNORMAL HIGH (ref 8.4–10.4)
Chloride: 107 mEq/L (ref 98–109)
Creatinine: 0.7 mg/dL (ref 0.6–1.1)
GLUCOSE: 89 mg/dL (ref 70–140)
Potassium: 4.5 mEq/L (ref 3.5–5.1)
Sodium: 136 mEq/L (ref 136–145)
Total Protein: 7.7 g/dL (ref 6.4–8.3)

## 2013-08-20 MED ORDER — DEXTROSE 5 % IV SOLN
Freq: Once | INTRAVENOUS | Status: AC
  Administered 2013-08-20: 12:00:00 via INTRAVENOUS

## 2013-08-20 MED ORDER — DEXAMETHASONE SODIUM PHOSPHATE 10 MG/ML IJ SOLN
INTRAMUSCULAR | Status: AC
  Filled 2013-08-20: qty 1

## 2013-08-20 MED ORDER — DEXAMETHASONE SODIUM PHOSPHATE 10 MG/ML IJ SOLN
10.0000 mg | Freq: Once | INTRAMUSCULAR | Status: AC
  Administered 2013-08-20: 10 mg via INTRAVENOUS

## 2013-08-20 MED ORDER — ONDANSETRON 8 MG/NS 50 ML IVPB
INTRAVENOUS | Status: AC
  Filled 2013-08-20: qty 8

## 2013-08-20 MED ORDER — FLUOROURACIL CHEMO INJECTION 2.5 GM/50ML
400.0000 mg/m2 | Freq: Once | INTRAVENOUS | Status: AC
  Administered 2013-08-20: 700 mg via INTRAVENOUS
  Filled 2013-08-20: qty 14

## 2013-08-20 MED ORDER — ONDANSETRON 8 MG/50ML IVPB (CHCC)
8.0000 mg | Freq: Once | INTRAVENOUS | Status: AC
  Administered 2013-08-20: 8 mg via INTRAVENOUS

## 2013-08-20 MED ORDER — SODIUM CHLORIDE 0.9 % IV SOLN
2400.0000 mg/m2 | INTRAVENOUS | Status: DC
  Administered 2013-08-20: 4200 mg via INTRAVENOUS
  Filled 2013-08-20: qty 84

## 2013-08-20 MED ORDER — LEUCOVORIN CALCIUM INJECTION 350 MG
700.0000 mg | Freq: Once | INTRAVENOUS | Status: AC
  Administered 2013-08-20: 700 mg via INTRAVENOUS
  Filled 2013-08-20: qty 35

## 2013-08-20 MED ORDER — OXALIPLATIN CHEMO INJECTION 100 MG/20ML
85.0000 mg/m2 | Freq: Once | INTRAVENOUS | Status: AC
  Administered 2013-08-20: 150 mg via INTRAVENOUS
  Filled 2013-08-20: qty 30

## 2013-08-20 NOTE — Progress Notes (Signed)
Patient continues to try to increase oral intake for weight maintenance.  She does have difficulty eating the day after chemotherapy.  She does continue to use strategies for increased intake with nausea.  She has nausea medication.  No new weight documented.    Nutrition diagnosis: Food and nutrition related knowledge deficit improved.  Intervention: Reinforced importance eating strategies for patient with nausea.  She was encouraged to take nausea medication as prescribed by her physician.  Questions were answered.  Teach back method used.  Monitoring, evaluation, goals: Patient will tolerate adequate calories and protein for weight maintenance.  She will follow strategies for improving nausea.  Next visit: No followup is scheduled at this time.  Patient has my contact information for questions or concerns.

## 2013-08-20 NOTE — Patient Instructions (Signed)
[facility] Discharge Instructions for Patients Receiving Chemotherapy  Today you received the following chemotherapy agents: Oxaliplatin, Leucovorin, 5FU (Adrucil).  To help prevent nausea and vomiting after your treatment, we encourage you to take your nausea medication as prescribed by your physician.   If you develop nausea and vomiting that is not controlled by your nausea medication, call the clinic.   BELOW ARE SYMPTOMS THAT SHOULD BE REPORTED IMMEDIATELY:  *FEVER GREATER THAN 100.5 F  *CHILLS WITH OR WITHOUT FEVER  NAUSEA AND VOMITING THAT IS NOT CONTROLLED WITH YOUR NAUSEA MEDICATION  *UNUSUAL SHORTNESS OF BREATH  *UNUSUAL BRUISING OR BLEEDING  TENDERNESS IN MOUTH AND THROAT WITH OR WITHOUT PRESENCE OF ULCERS  *URINARY PROBLEMS  *BOWEL PROBLEMS  UNUSUAL RASH Items with * indicate a potential emergency and should be followed up as soon as possible.  Feel free to call the clinic you have any questions or concerns. The clinic phone number is [phone].    

## 2013-08-21 ENCOUNTER — Encounter (HOSPITAL_COMMUNITY): Payer: Self-pay

## 2013-08-21 ENCOUNTER — Other Ambulatory Visit: Payer: Self-pay

## 2013-08-22 ENCOUNTER — Ambulatory Visit (HOSPITAL_BASED_OUTPATIENT_CLINIC_OR_DEPARTMENT_OTHER): Payer: Medicaid Other

## 2013-08-22 ENCOUNTER — Encounter (INDEPENDENT_AMBULATORY_CARE_PROVIDER_SITE_OTHER): Payer: Self-pay

## 2013-08-22 VITALS — BP 119/70 | HR 112 | Temp 97.0°F

## 2013-08-22 DIAGNOSIS — C19 Malignant neoplasm of rectosigmoid junction: Secondary | ICD-10-CM

## 2013-08-22 MED ORDER — SODIUM CHLORIDE 0.9 % IJ SOLN
10.0000 mL | INTRAMUSCULAR | Status: DC | PRN
  Administered 2013-08-22: 10 mL
  Filled 2013-08-22: qty 10

## 2013-08-22 MED ORDER — HEPARIN SOD (PORK) LOCK FLUSH 100 UNIT/ML IV SOLN
500.0000 [IU] | Freq: Once | INTRAVENOUS | Status: AC | PRN
  Administered 2013-08-22: 500 [IU]
  Filled 2013-08-22: qty 5

## 2013-08-24 ENCOUNTER — Ambulatory Visit: Payer: Self-pay | Admitting: Endocrinology

## 2013-08-27 ENCOUNTER — Encounter: Payer: Self-pay | Admitting: Genetic Counselor

## 2013-08-27 ENCOUNTER — Ambulatory Visit (HOSPITAL_BASED_OUTPATIENT_CLINIC_OR_DEPARTMENT_OTHER): Payer: Medicaid Other | Admitting: Genetic Counselor

## 2013-08-27 ENCOUNTER — Other Ambulatory Visit: Payer: Self-pay

## 2013-08-27 DIAGNOSIS — Z8042 Family history of malignant neoplasm of prostate: Secondary | ICD-10-CM

## 2013-08-27 DIAGNOSIS — Z5189 Encounter for other specified aftercare: Secondary | ICD-10-CM

## 2013-08-27 DIAGNOSIS — Z803 Family history of malignant neoplasm of breast: Secondary | ICD-10-CM

## 2013-08-27 DIAGNOSIS — Z800 Family history of malignant neoplasm of digestive organs: Secondary | ICD-10-CM

## 2013-08-27 DIAGNOSIS — C19 Malignant neoplasm of rectosigmoid junction: Secondary | ICD-10-CM

## 2013-08-27 NOTE — Progress Notes (Signed)
Dr.  Juliann Mule requested a consultation for genetic counseling and risk assessment for [name] [name], a 63 y.o. female, for discussion of her personal history of colon cancer and family history of colon, breast and prostate cancer.  She presents to clinic today to discuss the possibility of a genetic predisposition to cancer, and to further clarify her risks, as well as her family members' risks for cancer.   HISTORY OF PRESENT ILLNESS: In 2014, at the age of 20, [name] [name] was diagnosed with colon cancer. This was treated with stent and chemotherapy.  The tumor was tested for Lynch syndrome and the IHC did not show loss of expression of any of the MMR genes.  The patient has not had a mammogram in over 5 years, and has never had a colonoscopy.    Past Medical History  Diagnosis Date  . Colon cancer   . Anxiety     Past Surgical History  Procedure Laterality Date  . Abdominal hysterectomy  2005  . Cesarean section  1976  . Flexible sigmoidoscopy N/A 05/25/2013    Procedure: FLEXIBLE SIGMOIDOSCOPY;  Surgeon: [name], MD;  Location: [facility];  Service: Endoscopy;  Laterality: N/A;  needs floro  . Colonic stent placement N/A 05/25/2013    Procedure: COLONIC STENT PLACEMENT;  Surgeon: [name], MD;  Location: [facility];  Service: Endoscopy;  Laterality: N/A;  . Portacath placement Right 06/25/2013    Procedure: ULTRA SOUND GUIDED INSERTION PORT-A-CATH;  Surgeon: [name], MD;  Location: [facility];  Service: General;  Laterality: Right;    History   Social History  . Marital Status: Married    Spouse Name: N/A    Number of Children: 3  . Years of Education: N/A   Occupational History  .     Social History Main Topics  . Smoking status: Former Smoker -- 1 years    Quit date: 05/24/1971  . Smokeless tobacco: Never Used  . Alcohol Use: No  . Drug Use: No  . Sexual Activity: None   Other Topics Concern  . None   Social  History Narrative  . None    REPRODUCTIVE HISTORY AND PERSONAL RISK ASSESSMENT FACTORS: Menarche was at age 83.   postmenopausal Uterus Intact: no Ovaries Intact: yes G3P3A0, first live birth at age 35  She has not previously undergone treatment for infertility.   Oral Contraceptive use: 0 years   She has not used HRT in the past.    FAMILY HISTORY:  We obtained a detailed, 4-generation family history.  Significant diagnoses are listed below: Family History  Problem Relation Age of Onset  . Colon cancer Sister 10  . Diabetes Neg Hx   . Thyroid disease Neg Hx   . Breast cancer Maternal Aunt   . Prostate cancer Maternal Uncle   . Bone cancer Maternal Grandfather   . Prostate cancer Maternal Uncle     Patient's maternal ancestors are of [place] and [place] descent, and paternal ancestors are of African American descent. There is no reported Ashkenazi Jewish ancestry. There is no known consanguinity.  GENETIC COUNSELING ASSESSMENT: [name] is a 63 y.o. female with a personal history of colon cancer and family history of colon, prostate and breast cancer which somewhat suggestive of a hereditary cancer syndrome and predisposition to cancer. We, therefore, discussed and recommended the following at today's visit.   DISCUSSION: We reviewed the characteristics, features and inheritance patterns of hereditary  cancer syndromes. We also discussed genetic testing, including the appropriate family members to test, the process of testing, insurance coverage and turn-around-time for results. We discussed Lynch syndrome, and reviewed her IHC testing.  Based on no loss of expression of MMR genes, her chance for having Lynch syndrome has been greatly reduced.  We discussed the chance of having a different hereditary cancer syndrome such as having a change in CHEK2 which can increase the risk for colon, breast and prostate cancer.  We reviewed medical management changes that  could occur if someone were to be found with a hereditary cancer syndrome.  PLAN: After considering the risks, benefits, and limitations, [name] declined testing today, but will call and reschedule an appointment should she decide to pursue testing. We encouraged [place] to remain in contact with cancer genetics annually so that we can continuously update the family history and inform her of any changes in cancer genetics and testing that may be of benefit for her family. Tobias Alexander Siegrist's questions were answered to her satisfaction today. Our contact information was provided should additional questions or concerns arise.  The patient was seen for a total of 60 minutes, greater than 50% of which was spent face-to-face counseling.  This note will also be sent to the referring provider via the electronic medical record. The patient will be supplied with a summary of this genetic counseling discussion as well as educational information on the discussed hereditary cancer syndromes following the conclusion of their visit.   Patient was discussed with Dr. Marcy Panning.   _______________________________________________________________________ For Office Staff:  Number of people involved in session: 2 Was an Intern/ student involved with case: yes

## 2013-08-30 ENCOUNTER — Other Ambulatory Visit: Payer: Self-pay | Admitting: Medical Oncology

## 2013-08-30 ENCOUNTER — Telehealth: Payer: Self-pay | Admitting: Medical Oncology

## 2013-08-30 NOTE — Telephone Encounter (Signed)
Pt called and states she does not thinks she can come tomorrow for scan due to weather. She moved her CT to 3/06. I spoke with Dr. Juliann Mule and he would like for pt to have scan results when he see her. POF place to reschedule her appts.

## 2013-08-31 ENCOUNTER — Telehealth: Payer: Self-pay | Admitting: Internal Medicine

## 2013-08-31 ENCOUNTER — Telehealth: Payer: Self-pay

## 2013-08-31 ENCOUNTER — Ambulatory Visit (HOSPITAL_COMMUNITY): Payer: Medicaid Other

## 2013-08-31 NOTE — Telephone Encounter (Signed)
, °

## 2013-08-31 NOTE — Telephone Encounter (Signed)
Pt called to verify her schedule.

## 2013-09-03 ENCOUNTER — Encounter (HOSPITAL_COMMUNITY): Payer: Self-pay

## 2013-09-03 ENCOUNTER — Ambulatory Visit: Payer: Medicaid Other

## 2013-09-03 ENCOUNTER — Other Ambulatory Visit: Payer: Medicaid Other

## 2013-09-07 ENCOUNTER — Ambulatory Visit (HOSPITAL_COMMUNITY)
Admission: RE | Admit: 2013-09-07 | Discharge: 2013-09-07 | Disposition: A | Discharge status: Home / Self Care | Payer: Medicaid Other | Source: Ambulatory Visit | Attending: Internal Medicine | Admitting: Internal Medicine

## 2013-09-07 ENCOUNTER — Encounter (HOSPITAL_COMMUNITY): Payer: Self-pay

## 2013-09-07 DIAGNOSIS — D1803 Hemangioma of intra-abdominal structures: Secondary | ICD-10-CM | POA: Insufficient documentation

## 2013-09-07 DIAGNOSIS — D739 Disease of spleen, unspecified: Secondary | ICD-10-CM | POA: Insufficient documentation

## 2013-09-07 DIAGNOSIS — C19 Malignant neoplasm of rectosigmoid junction: Secondary | ICD-10-CM

## 2013-09-07 DIAGNOSIS — K7689 Other specified diseases of liver: Secondary | ICD-10-CM | POA: Insufficient documentation

## 2013-09-07 DIAGNOSIS — C787 Secondary malignant neoplasm of liver and intrahepatic bile duct: Secondary | ICD-10-CM | POA: Insufficient documentation

## 2013-09-07 DIAGNOSIS — C189 Malignant neoplasm of colon, unspecified: Secondary | ICD-10-CM | POA: Insufficient documentation

## 2013-09-07 MED ORDER — IOHEXOL 300 MG/ML  SOLN
100.0000 mL | Freq: Once | INTRAMUSCULAR | Status: AC | PRN
  Administered 2013-09-07: 100 mL via INTRAVENOUS

## 2013-09-13 ENCOUNTER — Telehealth: Payer: Self-pay | Admitting: *Deleted

## 2013-09-13 ENCOUNTER — Telehealth: Payer: Self-pay | Admitting: Internal Medicine

## 2013-09-13 ENCOUNTER — Other Ambulatory Visit (HOSPITAL_BASED_OUTPATIENT_CLINIC_OR_DEPARTMENT_OTHER): Payer: Medicaid Other

## 2013-09-13 ENCOUNTER — Ambulatory Visit (HOSPITAL_BASED_OUTPATIENT_CLINIC_OR_DEPARTMENT_OTHER): Payer: Medicaid Other | Admitting: Internal Medicine

## 2013-09-13 VITALS — BP 137/59 | HR 88 | Temp 97.9°F | Resp 18 | Ht 66.25 in | Wt 150.5 lb

## 2013-09-13 DIAGNOSIS — K56609 Unspecified intestinal obstruction, unspecified as to partial versus complete obstruction: Secondary | ICD-10-CM

## 2013-09-13 DIAGNOSIS — Z800 Family history of malignant neoplasm of digestive organs: Secondary | ICD-10-CM

## 2013-09-13 DIAGNOSIS — C19 Malignant neoplasm of rectosigmoid junction: Secondary | ICD-10-CM

## 2013-09-13 DIAGNOSIS — C7889 Secondary malignant neoplasm of other digestive organs: Secondary | ICD-10-CM

## 2013-09-13 DIAGNOSIS — C787 Secondary malignant neoplasm of liver and intrahepatic bile duct: Secondary | ICD-10-CM

## 2013-09-13 LAB — CBC WITH DIFFERENTIAL/PLATELET
BASO%: 0.7 % (ref 0.0–2.0)
Basophils Absolute: 0 10*3/uL (ref 0.0–0.1)
EOS%: 3 % (ref 0.0–7.0)
Eosinophils Absolute: 0.1 10*3/uL (ref 0.0–0.5)
HCT: 33.8 % — ABNORMAL LOW (ref 34.8–46.6)
HGB: 10.9 g/dL — ABNORMAL LOW (ref 11.6–15.9)
LYMPH#: 1.6 10*3/uL (ref 0.9–3.3)
LYMPH%: 36.4 % (ref 14.0–49.7)
MCH: 27.3 pg (ref 25.1–34.0)
MCHC: 32.2 g/dL (ref 31.5–36.0)
MCV: 84.5 fL (ref 79.5–101.0)
MONO#: 0.5 10*3/uL (ref 0.1–0.9)
MONO%: 11.1 % (ref 0.0–14.0)
NEUT#: 2.1 10*3/uL (ref 1.5–6.5)
NEUT%: 48.8 % (ref 38.4–76.8)
Platelets: 241 10*3/uL (ref 145–400)
RBC: 4 10*6/uL (ref 3.70–5.45)
RDW: 15.7 % — AB (ref 11.2–14.5)
WBC: 4.3 10*3/uL (ref 3.9–10.3)

## 2013-09-13 LAB — COMPREHENSIVE METABOLIC PANEL (CC13)
ALT: 16 U/L (ref 0–55)
AST: 22 U/L (ref 5–34)
Albumin: 3.3 g/dL — ABNORMAL LOW (ref 3.5–5.0)
Alkaline Phosphatase: 68 U/L (ref 40–150)
Anion Gap: 9 mEq/L (ref 3–11)
BUN: 12.5 mg/dL (ref 7.0–26.0)
CALCIUM: 11.8 mg/dL — AB (ref 8.4–10.4)
CHLORIDE: 108 meq/L (ref 98–109)
CO2: 23 mEq/L (ref 22–29)
Creatinine: 0.8 mg/dL (ref 0.6–1.1)
Glucose: 95 mg/dl (ref 70–140)
POTASSIUM: 4.5 meq/L (ref 3.5–5.1)
Sodium: 140 mEq/L (ref 136–145)
Total Bilirubin: <0.2 mg/dL (ref 0.20–1.20)
Total Protein: 8.5 g/dL — ABNORMAL HIGH (ref 6.4–8.3)

## 2013-09-13 LAB — CEA: CEA: 33.5 ng/mL — ABNORMAL HIGH (ref 0.0–5.0)

## 2013-09-13 MED ORDER — LOPERAMIDE HCL 2 MG PO TABS: ORAL_TABLET | ORAL | Status: DC

## 2013-09-13 NOTE — Telephone Encounter (Signed)
Per staff message and POF I have scheduled appts.  JMW  

## 2013-09-13 NOTE — Patient Instructions (Addendum)
Irinotecan injection What is this medicine? IRINOTECAN (ir in oh TEE kan ) is a chemotherapy drug. It is used to treat colon and rectal cancer. This medicine may be used for other purposes; ask your health care provider or pharmacist if you have questions. COMMON BRAND NAME(S): Camptosar What should I tell my health care provider before I take this medicine? They need to know if you have any of these conditions: -blood disorders -dehydration -diarrhea -infection (especially a virus infection such as chickenpox, cold sores, or herpes) -liver disease -low blood counts, like low white cell, platelet, or red cell counts -recent or ongoing radiation therapy -an unusual or allergic reaction to irinotecan, sorbitol, other chemotherapy, other medicines, foods, dyes, or preservatives -pregnant or trying to get pregnant -breast-feeding How should I use this medicine? This drug is given as an infusion into a vein. It is administered in a hospital or clinic by a specially trained health care professional. Talk to your pediatrician regarding the use of this medicine in children. Special care may be needed. Overdosage: If you think you have taken too much of this medicine contact a poison control center or emergency room at once. NOTE: This medicine is only for you. Do not share this medicine with others. What if I miss a dose? It is important not to miss your dose. Call your doctor or health care professional if you are unable to keep an appointment. What may interact with this medicine? Do not take this medicine with any of the following medications: -atazanavir -ketoconazole -St. John's Wort This medicine may also interact with the following medications: -dexamethasone -diuretics -laxatives -medicines for seizures like carbamazepine, mephobarbital, phenobarbital, phenytoin, primidone -medicines to increase blood counts like filgrastim, pegfilgrastim,  sargramostim -prochlorperazine -vaccines This list may not describe all possible interactions. Give your health care provider a list of all the medicines, herbs, non-prescription drugs, or dietary supplements you use. Also tell them if you smoke, drink alcohol, or use illegal drugs. Some items may interact with your medicine. What should I watch for while using this medicine? Your condition will be monitored carefully while you are receiving this medicine. You will need important blood work done while you are taking this medicine. This drug may make you feel generally unwell. This is not uncommon, as chemotherapy can affect healthy cells as well as cancer cells. Report any side effects. Continue your course of treatment even though you feel ill unless your doctor tells you to stop. In some cases, you may be given additional medicines to help with side effects. Follow all directions for their use. You may get drowsy or dizzy. Do not drive, use machinery, or do anything that needs mental alertness until you know how this medicine affects you. Do not stand or sit up quickly, especially if you are an older patient. This reduces the risk of dizzy or fainting spells. Call your doctor or health care professional for advice if you get a fever, chills or sore throat, or other symptoms of a cold or flu. Do not treat yourself. This drug decreases your body's ability to fight infections. Try to avoid being around people who are sick. This medicine may increase your risk to bruise or bleed. Call your doctor or health care professional if you notice any unusual bleeding. Be careful brushing and flossing your teeth or using a toothpick because you may get an infection or bleed more easily. If you have any dental work done, tell your dentist you are receiving this medicine. Avoid  taking products that contain aspirin, acetaminophen, ibuprofen, naproxen, or ketoprofen unless instructed by your doctor. These medicines may  hide a fever. Do not become pregnant while taking this medicine. Women should inform their doctor if they wish to become pregnant or think they might be pregnant. There is a potential for serious side effects to an unborn child. Talk to your health care professional or pharmacist for more information. Do not breast-feed an infant while taking this medicine. What side effects may I notice from receiving this medicine? Side effects that you should report to your doctor or health care professional as soon as possible: -allergic reactions like skin rash, itching or hives, swelling of the face, lips, or tongue -low blood counts - this medicine may decrease the number of white blood cells, red blood cells and platelets. You may be at increased risk for infections and bleeding. -signs of infection - fever or chills, cough, sore throat, pain or difficulty passing urine -signs of decreased platelets or bleeding - bruising, pinpoint red spots on the skin, black, tarry stools, blood in the urine -signs of decreased red blood cells - unusually weak or tired, fainting spells, lightheadedness -breathing problems -chest pain -diarrhea -feeling faint or lightheaded, falls -flushing, runny nose, sweating during infusion -mouth sores or pain -pain, swelling, redness or irritation where injected -pain, swelling, warmth in the leg -pain, tingling, numbness in the hands or feet -problems with balance, talking, walking -stomach cramps, pain -trouble passing urine or change in the amount of urine -vomiting as to be unable to hold down drinks or food -yellowing of the eyes or skin Side effects that usually do not require medical attention (report to your doctor or health care professional if they continue or are bothersome): -constipation -hair loss -headache -loss of appetite -nausea, vomiting -stomach upset This list may not describe all possible side effects. Call your doctor for medical advice about side  effects. You may report side effects to FDA at 1-800-FDA-1088. Where should I keep my medicine? This drug is given in a hospital or clinic and will not be stored at home. NOTE: This sheet is a summary. It may not cover all possible information. If you have questions about this medicine, talk to your doctor, pharmacist, or health care provider.  2014, Elsevier/Gold Standard. (2007-11-07 16:29:12)   Loperamide tablets or capsules What is this medicine? LOPERAMIDE (loe PER a mide) is used to treat diarrhea. This medicine may be used for other purposes; ask your health care provider or pharmacist if you have questions. COMMON BRAND NAME(S): Anti-Diarrheal, Imodium A-D, K-Pek II What should I tell my health care provider before I take this medicine? They need to know if you have any of these conditions: -a black or bloody stool -bacterial food poisoning -colitis or mucus in your stool -currently taking an antibiotic medication for an infection -fever -liver disease -severe abdominal pain, swelling or bulging -an unusual or allergic reaction to loperamide, other medicines, foods, dyes, or preservatives -pregnant or trying to get pregnant -breast-feeding How should I use this medicine? Take this medicine by mouth with a glass of water. Follow the directions on the prescription label. Take your doses at regular intervals. Do not take your medicine more often than directed. Talk to your pediatrician regarding the use of this medicine in children. Special care may be needed. Overdosage: If you think you have taken too much of this medicine contact a poison control center or emergency room at once. NOTE: This medicine is only for  you. Do not share this medicine with others. What if I miss a dose? This does not apply. This medicine is not for regular use. Only take this medicine while you continue to have loose bowel movements. Do not take more medicine than recommended by the packaging label or  by your healthcare professional. What may interact with this medicine? Do not take this medicine with any of the following medications: -alosetron This medicine may also interact with the following medications: -quinidine -ritonavir -saquinavir This list may not describe all possible interactions. Give your health care provider a list of all the medicines, herbs, non-prescription drugs, or dietary supplements you use. Also tell them if you smoke, drink alcohol, or use illegal drugs. Some items may interact with your medicine. What should I watch for while using this medicine? Do not take this medicine for more than 1 week without asking your doctor or health care professional. If your symptoms do not start to get better after two days, you may have a problem that needs further evaluation. Check with your doctor or health care professional right away if you develop a fever, severe abdominal pain, swelling or bulging, or if you have have bloody/black diarrhea or stools. You may get drowsy or dizzy. Do not drive, use machinery, or do anything that needs mental alertness until you know how this medicine affects you. Do not stand or sit up quickly, especially if you are an older patient. This reduces the risk of dizzy or fainting spells. Alcohol can increase possible drowsiness and dizziness. Avoid alcoholic drinks. Your mouth may get dry. Chewing sugarless gum or sucking hard candy, and drinking plenty of water may help. Contact your doctor if the problem does not go away or is severe. Drinking plenty of water can also help prevent dehydration that can occur with diarrhea. Elderly patients may have a more variable response to the effects of this medicine, and are more susceptible to the effects of dehydration. What side effects may I notice from receiving this medicine? Side effects that you should report to your doctor or health care professional as soon as possible: -allergic reactions like skin rash,  itching or hives, swelling of the face, lips, or tongue -bloated, swollen feeling in your abdomen -blurred vision -loss of appetite -stomach pain Side effects that usually do not require medical attention (report to your doctor or health care professional if they continue or are bothersome): -constipation -drowsiness or dizziness -dry mouth -nausea, vomiting This list may not describe all possible side effects. Call your doctor for medical advice about side effects. You may report side effects to FDA at 1-800-FDA-1088. Where should I keep my medicine? Keep out of the reach of children. Store at room temperature between 15 and 25 degrees C (59 and 77 degrees F). Keep container tightly closed. Throw away any unused medicine after the expiration date. NOTE: This sheet is a summary. It may not cover all possible information. If you have questions about this medicine, talk to your doctor, pharmacist, or health care provider.  2014, Elsevier/Gold Standard. (2007-12-26 16:02:13)

## 2013-09-13 NOTE — Telephone Encounter (Signed)
gv pt appt schedule for march/april. per Dr. Juliann Mule pt to see [name] [name] 4/13 due to he is full.

## 2013-09-15 NOTE — Progress Notes (Signed)
[facility] OFFICE PROGRESS NOTE  Ameira Alessandrini, MD [place] [address]  DIAGNOSIS: Colorectal cancer, stage IV - Plan: loperamide (IMODIUM A-D) 2 MG tablet, CBC with Differential, Comprehensive metabolic panel (Cmet) - [facility], CBC with Differential, Comprehensive metabolic panel (Cmet) - [facility], CEA  Family history of colorectal cancer  Colon obstruction  Hypercalcemia  Chief Complaint  Patient presents with  . Colorectal cancer, stage IV    CURRENT TREATMENT: Folfox q 14 weeks.      Colorectal cancer, stage IV   05/22/2013 - 05/27/2013 Hospital Admission A/w abdominal distention and constipation for 2 days CT scan of her abdomen and pelvis showed area of bowel wall thickening at the rectosigmoid junction concerning for malignancy.   05/22/2013 Imaging CT of Abdomen: bowel wall thickening with shouldering of the proximal and distal margins, involving therectosigmoid junction, supicous liver spleen mets. Colonsocopy recommended.    05/23/2013 Tumor Marker CEA 225.7   05/24/2013 Imaging MRI of abdomen: 3.7 x 4.4 cm enhancing lesion along the superior spleen, indeterminate but worrisome for metastasis.2.7 x 3.9 cm enhancing lesion in the medial segment left hepatic lobe,    05/25/2013 Pathology Diagnosis Rectum, biopsy, proximal - INVASIVE ADENOCARCINOMA.   05/25/2013 Procedure Flex sig: (Dr. Ardis Hughs). 5-6 cm obstructing rectosigmoid mass distal end 10 cm from anal verge; mass biopsed and then stented 9 cm long, 22 cm diameter uncovered SEM.    05/25/2013 Initial Diagnosis Colorectal cancer, stage IV   06/25/2013 Procedure R port a cath placement.   07/09/2013 - 08/20/2013 Chemotherapy FOLFOX q 2 weeks started.  Not elgible for clinical trial and bevacimab based on stent and high risk for perforation.    07/30/2013 Tumor Marker KRAS positive.  p53, APC identified.    08/06/2013 Adverse Reaction Complains of darkening of her hands with peeling.    08/27/2013 Family  History She was seen by Genetic couselor.  She declined testing today, but will call and reschedule an appointment should she decide to pursue testing.   09/07/2013 Imaging CT abdomen: Mixed response to therapy. Response to therapy of hepatic metastasis. right hepatic hemangiomas are again identified. Other too small to characterize liver lesions are felt to be similar but are indeterminate.. Enlargement of a splenic lesions   09/13/2013 Tumor Marker CEA 33.5   09/13/2013 Treatment Plan Change Reviewed scans consistent with mixed response.  Switch to FOLFIRI.  Provided script for immodium.      INTERVAL HISTORY: [name] [name] 63 y.o. female with a history of Stage IV colon cancer is here for follow-up.  She was last seen by me on 08/20/2013.   Today, she reports her bowel movements are regular and that she is with minimal pain. She denies any fevers or chills. She also reports that her weight is stable with an adequate appetite. She is very active and handles her basic, intermediate and advanced ADLs without difficulties. She stated she had nausea and vomiting the first 2 days of each cycle.  She reports good energy and appetite.  She complains hyperpigmentation of her hands. Oherwise she did well. She was seen by Dr. Dwyane Dee of endocrinology.    MEDICAL HISTORY: Past Medical History  Diagnosis Date  . Colon cancer   . Anxiety     INTERIM HISTORY: has Rectal mass; Colon obstruction; Hypercalcemia; Colorectal cancer, stage IV; Family history of colorectal cancer; Thrush, oral; and Tachycardia on her problem list.    ALLERGIES:  is allergic to codeine.  MEDICATIONS: has a current  medication list which includes the following prescription(s): lidocaine-prilocaine, loperamide, omeprazole, polyethylene glycol, potassium, tramadol, and cyanocobalamin.  SURGICAL HISTORY:  Past Surgical History  Procedure Laterality Date  . Abdominal hysterectomy  2005  . Cesarean section  1976  . Flexible  sigmoidoscopy N/A 05/25/2013    Procedure: FLEXIBLE SIGMOIDOSCOPY;  Surgeon: [name], MD;  Location: [facility];  Service: Endoscopy;  Laterality: N/A;  needs floro  . Colonic stent placement N/A 05/25/2013    Procedure: COLONIC STENT PLACEMENT;  Surgeon: [name], MD;  Location: [facility];  Service: Endoscopy;  Laterality: N/A;  . Portacath placement Right 06/25/2013    Procedure: ULTRA SOUND GUIDED INSERTION PORT-A-CATH;  Surgeon: [name], MD;  Location: [facility];  Service: General;  Laterality: Right;    REVIEW OF SYSTEMS:   Constitutional: Denies fevers, chills or abnormal weight loss Eyes: Denies blurriness of vision Ears, nose, mouth, throat, and face: Denies mucositis or sore throat Respiratory: Denies cough, dyspnea or wheezes Cardiovascular: Denies palpitation, chest discomfort or lower extremity swelling Gastrointestinal:  Denies nausea, heartburn or change in bowel habits Skin: Denies abnormal skin rashes Lymphatics: Denies new lymphadenopathy or easy bruising Neurological:Denies numbness, tingling or new weaknesses Behavioral/Psych: Mood is stable, no new changes  All other systems were reviewed with the patient and are negative.  PHYSICAL EXAMINATION: ECOG PERFORMANCE STATUS: 0 - Asymptomatic  Blood pressure 137/59, pulse 88, temperature 97.9 F (36.6 C), temperature source Oral, resp. rate 18, height 5' 6.25" (1.683 m), weight 150 lb 8 oz (68.266 kg), SpO2 98.00%.  GENERAL:alert, no distress and comfortable; well-developed, well nourished.  SKIN: skin color, texture, turgor are normal, no rashes or significant lesions; + R Port a cath without tenderness  EYES: normal, Conjunctiva are pink and non-injected, sclera clear  OROPHARYNX:no exudate, no erythema and lips, buccal mucosa, and tongue with whitish strips  NECK: supple, thyroid normal size, non-tender, without nodularity  LYMPH: no palpable lymphadenopathy in the  cervical, axillary or supraclavicular  LUNGS: clear to auscultation and percussion with normal breathing effort  HEART: Tachycardic with regular rhythm and no murmurs and no lower extremity edema  ABDOMEN:abdomen soft, non-tender and normal bowel sounds; + mild distention  Musculoskeletal:no cyanosis of digits and no clubbing  NEURO: alert & oriented x 3 with fluent speech, no focal motor/sensory deficits  Labs:  Lab Results  Component Value Date   WBC 4.3 09/13/2013   HGB 10.9* 09/13/2013   HCT 33.8* 09/13/2013   MCV 84.5 09/13/2013   PLT 241 09/13/2013   NEUTROABS 2.1 09/13/2013      Chemistry      Component Value Date/Time   NA 140 09/13/2013 0830   NA 138 07/24/2013 1808   K 4.5 09/13/2013 0830   K 4.7 07/24/2013 1808   CL 101 07/24/2013 1808   CO2 23 09/13/2013 0830   CO2 23 07/24/2013 1808   BUN 12.5 09/13/2013 0830   BUN 20 07/24/2013 1808   CREATININE 0.8 09/13/2013 0830   CREATININE 0.76 07/24/2013 1808      Component Value Date/Time   CALCIUM 11.8* 09/13/2013 0830   CALCIUM 12.6* 07/24/2013 1808   ALKPHOS 68 09/13/2013 0830   ALKPHOS 89 05/22/2013 0930   AST 22 09/13/2013 0830   AST 26 05/22/2013 0930   ALT 16 09/13/2013 0830   ALT 15 05/22/2013 0930   BILITOT <0.20 09/13/2013 0830   BILITOT 0.2* 05/22/2013 0930       Basic Metabolic Panel:  Recent Labs Lab  09/13/13 0830  NA 140  K 4.5  CO2 23  GLUCOSE 95  BUN 12.5  CREATININE 0.8  CALCIUM 11.8*   GFR Estimated Creatinine Clearance: 69.8 ml/min (by C-G formula based on Cr of 0.8). Liver Function Tests:  Recent Labs Lab 09/13/13 0830  AST 22  ALT 16  ALKPHOS 68  BILITOT <0.20  PROT 8.5*  ALBUMIN 3.3*    CBC:  Recent Labs Lab 09/13/13 0830  WBC 4.3  NEUTROABS 2.1  HGB 10.9*  HCT 33.8*  MCV 84.5  PLT 241   Results for FLORIDE, HUTMACHER (MRN [id number]) as of 09/15/2013 13:05  Ref. Range 05/23/2013 20:48 06/04/2013 11:13 07/02/2013 09:54 07/23/2013 09:46 09/13/2013 08:30  CEA Latest Range: 0.0-5.0  ng/mL 225.7 (H) 140.5 (H) 272.3 (H) 286.2 (H) 33.5 (H)   Studies:  No results found.   RADIOGRAPHIC STUDIES: Ct Abdomen Pelvis W Contrast (Reviewed personally by me)  09/07/2013   CLINICAL DATA:  Colon cancer diagnosed in 2014. Chemotherapy in progress. Colonic stent in place. Stage 4 colon cancer.  EXAM: CT ABDOMEN AND PELVIS WITH CONTRAST  TECHNIQUE: Multidetector CT imaging of the abdomen and pelvis was performed using the standard protocol following bolus administration of intravenous contrast.  CONTRAST:  125m OMNIPAQUE IOHEXOL 300 MG/ML  SOLN  COMPARISON:  MR ABDOMEN WO/W CM dated 05/24/2013; CT ABD/PELVIS W CM dated 05/22/2013  FINDINGS: Lower Chest: 6 mm left lower lobe lung nodule on image 1 which is unchanged. Normal heart size without pericardial or pleural effusion. Fluid level in a distal esophagus on image 13.  Abdomen/Pelvis: Too small to characterize posterior right hepatic lobe lesion on image 15 is unchanged. Giant hemangioma in the right lobe is similar at greater than 7.5 cm. More cephalad right liver lobe hemangioma is again identified at 1.8 cm.  Suspect a too small to characterize posterior right hepatic lobe lesion on image 17, likely similar on the prior.  The left hepatic lobe metastasis is decreased. Measures 1.6 x 1.8 cm on image 16 versus 3.3 x 2.9 cm on the prior exam.  Splenule. An anterior splenic mass measures 4.4 x 4.8 cm and is enlarged from a 4.5 x 4.0 cm on the prior.  Normal stomach, pancreas, gallbladder, biliary tract, adrenal glands, right kidney. Left nephrolithiasis. Too small to characterize interpolar left renal lesion. Adenopathy along the inferior mesenteric artery is at the root of the sigmoid mesocolon. 1.0 cm on image 51, new. Mildly prominent preaortic node on image 45 is also new.  A stent has been placed across the previously described rectosigmoid mass. No surrounding fluid collection or other acute complication. Large proximal colonic stool burden,  suggesting a component of constipation. Normal terminal ileum and appendix. Normal small bowel caliber. Ventral abdominal wall laxity containing nonobstructive small bowel on image 48.  No pelvic sidewall adenopathy. Normal urinary bladder. Hysterectomy. No adnexal mass. No significant free fluid.  Bones/Musculoskeletal: Degenerative changes about the symphysis pubis. Degenerative disc disease at the lumbosacral junction.  IMPRESSION: 1. Mixed response to therapy. 2. Response to therapy of hepatic metastasis. 2 right hepatic hemangiomas are again identified. Other too small to characterize liver lesions are felt to be similar but are indeterminate. 3. Enlargement of a splenic lesion, suspicious for metastasis. 4. Development of adenopathy in the sigmoid mesocolon and low retroperitoneum, highly suspicious for nodal metastasis. 5. Indeterminate left lung base nodule, unchanged 6. Esophageal air fluid level suggests dysmotility or gastroesophageal reflux. 7. Left nephrolithiasis.   Electronically Signed   By: KMarylyn Ishihara  Jobe Igo M.D.   On: 09/07/2013 09:39    ASSESSMENT: [name] [name] 63 y.o. female with a history of Colorectal cancer, stage IV - Plan: loperamide (IMODIUM A-D) 2 MG tablet, CBC with Differential, Comprehensive metabolic panel (Cmet) - [facility], CBC with Differential, Comprehensive metabolic panel (Cmet) - [facility], CEA  Family history of colorectal cancer  Colon obstruction  Hypercalcemia   ASSESSMENT: 63 yo AAF with family history of colon cancer, admitted with obstructive mass s/p stent and biopsy on 11/21 consistent with metastatic colon cancer with mets to liver, spleen. ECOG 0. Elevated CEA.  PLAN:  1. 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  -- Today we reviewed her recent CT of abdomen which is consistent with mixed response to therapy.  We will change to FOLFIRI.   There is no difference in overall response rate, time to progression and  overall survival for patients treated with FOLFIRI versus FOLFOX4 in the treatment of advanced colorectal cancer. [facility], 2005, Aug 1; 23(22): 2409-73. For FOLFIRI and FOLFOX respectively, the median to to progression was 7 months versus 7 months; duration of response was 9 versus 10 months; overall survival was 14 versus 15 months and overall response rates were 31% versus 34%. Other potential therapies if progression is 5-FU/LV, CapeOx or capecitabine. We plan to start [place] on 09/18/2013 q 14 day until progression or unacceptable toxicity. It consists of the following:   Day # 1 Irinotecan 322 mg for 90 minutes  Leucovorin 400 mg/m2 for 2 hours before 5-FU d1  Fluorouracil 400 mg/m2 once starting 2.5 After treatment start time  Days #1/2 Fluorouracil 2,400 mg/m2 for 46 hours  Day #3 Pump D/C   The reference is from [place]. Al, Irinotecan combined with fluorouracil compared with fluororacil alone as first-line treatment for metastatic colorectal cancer: a multicenter randomised trial.  Lancet 2000 Apr 15; 355 (5329):  In addition, we provided an in depth discussion concerning the indications and side-effects of therapy. Side-effects includes but is not limited to bone marrow suppression (i.e., anemia, thrombocytopenia or leukopenia which can lead to life threatening infections), nausea or vomiting, gastrointestinal effects including diarrhea and electrolyte imbalances, neurotoxicity mainly from oxaliplatinum, liver and/or kidney dysfunction. We will not provide neulasta unless she has febrile neutropenia as risk of febrile neutropenia is less than 5%. We will follow labs prior to chemotherapy. She understood the indications, benefits and risk of therapy and chose to proceed.  We will obtain a restaging CT of abdomen pelvis s/p 2 cycles of FOLFORI.   2. Family history of Colon cancer.  --Based on her age and family history (sister deceased at age 78 from colon cancer),  referral for genetic testing for Lynch syndrome, i.e. MSI or IHC, would be appropriate. We made a referral to genetics. Her tumor was negative for lynch syndrome. She was seen on 02/23 but declined further testing for genetic syndromes presently.   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  --Parathyroid hormone elevated 192.5 and parathyroid hormone-related peptide pending. PTH is generally low when hypercalcemia is secondary to malignancy. In primary PTH, it is elevated. We counseled continue aggressive fluid hydration. She was instructed to report to emergency room with worsening constipation or confusion. We referred to endocrinology for further evaluation. She was seen by Dr. Dwyane Dee.  We will give intermittent zometa.    4. Follow-up.  --Patient instructed to follow-up 2 weeks for labs and 4 weeks prior to consideration  of next chemotherapy cycle.  All questions were answered. The patient knows to call the clinic with any problems, questions or concerns. We can certainly see the patient much sooner if necessary.  I spent 25 minutes counseling the patient face to face. The total time spent in the appointment was 40 minutes.    Ladamien Rammel, MD 09/15/2013 1:46 PM

## 2013-09-17 ENCOUNTER — Ambulatory Visit (HOSPITAL_BASED_OUTPATIENT_CLINIC_OR_DEPARTMENT_OTHER): Payer: Medicaid Other

## 2013-09-17 ENCOUNTER — Other Ambulatory Visit (HOSPITAL_BASED_OUTPATIENT_CLINIC_OR_DEPARTMENT_OTHER): Payer: Medicaid Other

## 2013-09-17 ENCOUNTER — Other Ambulatory Visit: Payer: Self-pay | Admitting: Internal Medicine

## 2013-09-17 VITALS — BP 132/78 | HR 95 | Temp 97.9°F | Resp 20

## 2013-09-17 DIAGNOSIS — Z5111 Encounter for antineoplastic chemotherapy: Secondary | ICD-10-CM

## 2013-09-17 DIAGNOSIS — C7889 Secondary malignant neoplasm of other digestive organs: Secondary | ICD-10-CM

## 2013-09-17 DIAGNOSIS — C787 Secondary malignant neoplasm of liver and intrahepatic bile duct: Secondary | ICD-10-CM

## 2013-09-17 DIAGNOSIS — C19 Malignant neoplasm of rectosigmoid junction: Secondary | ICD-10-CM

## 2013-09-17 LAB — CBC WITH DIFFERENTIAL/PLATELET
BASO%: 0.5 % (ref 0.0–2.0)
Basophils Absolute: 0 10*3/uL (ref 0.0–0.1)
EOS ABS: 0.2 10*3/uL (ref 0.0–0.5)
EOS%: 2.8 % (ref 0.0–7.0)
HEMATOCRIT: 34.4 % — AB (ref 34.8–46.6)
HGB: 11.2 g/dL — ABNORMAL LOW (ref 11.6–15.9)
LYMPH%: 29.4 % (ref 14.0–49.7)
MCH: 27.1 pg (ref 25.1–34.0)
MCHC: 32.6 g/dL (ref 31.5–36.0)
MCV: 83.1 fL (ref 79.5–101.0)
MONO#: 0.5 10*3/uL (ref 0.1–0.9)
MONO%: 8.7 % (ref 0.0–14.0)
NEUT%: 58.6 % (ref 38.4–76.8)
NEUTROS ABS: 3.3 10*3/uL (ref 1.5–6.5)
PLATELETS: 265 10*3/uL (ref 145–400)
RBC: 4.14 10*6/uL (ref 3.70–5.45)
RDW: 15.6 % — ABNORMAL HIGH (ref 11.2–14.5)
WBC: 5.6 10*3/uL (ref 3.9–10.3)
lymph#: 1.7 10*3/uL (ref 0.9–3.3)
nRBC: 0 % (ref 0–0)

## 2013-09-17 LAB — COMPREHENSIVE METABOLIC PANEL (CC13)
ALBUMIN: 3.3 g/dL — AB (ref 3.5–5.0)
ALT: 12 U/L (ref 0–55)
AST: 20 U/L (ref 5–34)
Alkaline Phosphatase: 67 U/L (ref 40–150)
Anion Gap: 7 mEq/L (ref 3–11)
BUN: 13 mg/dL (ref 7.0–26.0)
CHLORIDE: 107 meq/L (ref 98–109)
CO2: 22 meq/L (ref 22–29)
Calcium: 11.8 mg/dL — ABNORMAL HIGH (ref 8.4–10.4)
Creatinine: 0.7 mg/dL (ref 0.6–1.1)
Glucose: 91 mg/dl (ref 70–140)
Potassium: 4.2 mEq/L (ref 3.5–5.1)
Sodium: 137 mEq/L (ref 136–145)
TOTAL PROTEIN: 8.6 g/dL — AB (ref 6.4–8.3)
Total Bilirubin: <0.2 mg/dL (ref 0.20–1.20)

## 2013-09-17 MED ORDER — DEXAMETHASONE SODIUM PHOSPHATE 20 MG/5ML IJ SOLN
INTRAMUSCULAR | Status: AC
  Filled 2013-09-17: qty 5

## 2013-09-17 MED ORDER — ONDANSETRON 16 MG/50ML IVPB (CHCC)
INTRAVENOUS | Status: AC
  Filled 2013-09-17: qty 16

## 2013-09-17 MED ORDER — DEXAMETHASONE SODIUM PHOSPHATE 20 MG/5ML IJ SOLN
20.0000 mg | Freq: Once | INTRAMUSCULAR | Status: AC
  Administered 2013-09-17: 20 mg via INTRAVENOUS

## 2013-09-17 MED ORDER — SODIUM CHLORIDE 0.9 % IV SOLN
Freq: Once | INTRAVENOUS | Status: AC
  Administered 2013-09-17: 13:00:00 via INTRAVENOUS

## 2013-09-17 MED ORDER — FLUOROURACIL CHEMO INJECTION 2.5 GM/50ML
400.0000 mg/m2 | Freq: Once | INTRAVENOUS | Status: AC
  Administered 2013-09-17: 700 mg via INTRAVENOUS
  Filled 2013-09-17: qty 14

## 2013-09-17 MED ORDER — ATROPINE SULFATE 1 MG/ML IJ SOLN
INTRAMUSCULAR | Status: AC
  Filled 2013-09-17: qty 1

## 2013-09-17 MED ORDER — ATROPINE SULFATE 1 MG/ML IJ SOLN
0.5000 mg | Freq: Once | INTRAMUSCULAR | Status: AC | PRN
  Administered 2013-09-17: 0.5 mg via INTRAVENOUS

## 2013-09-17 MED ORDER — IRINOTECAN HCL CHEMO INJECTION 100 MG/5ML
179.0000 mg/m2 | Freq: Once | INTRAVENOUS | Status: AC
  Administered 2013-09-17: 320 mg via INTRAVENOUS
  Filled 2013-09-17: qty 16

## 2013-09-17 MED ORDER — ONDANSETRON 16 MG/50ML IVPB (CHCC)
16.0000 mg | Freq: Once | INTRAVENOUS | Status: AC
  Administered 2013-09-17: 16 mg via INTRAVENOUS

## 2013-09-17 MED ORDER — LEUCOVORIN CALCIUM INJECTION 350 MG
391.0000 mg/m2 | Freq: Once | INTRAVENOUS | Status: AC
  Administered 2013-09-17: 700 mg via INTRAVENOUS
  Filled 2013-09-17: qty 35

## 2013-09-17 MED ORDER — FLUOROURACIL CHEMO INJECTION 5 GM/100ML
2400.0000 mg/m2 | INTRAVENOUS | Status: DC
  Administered 2013-09-17: 4300 mg via INTRAVENOUS
  Filled 2013-09-17: qty 86

## 2013-09-17 NOTE — Patient Instructions (Signed)
[facility] Discharge Instructions for Patients Receiving Chemotherapy  Today you received the following chemotherapy agents: Irinotecan, Leucovorin, 5FU   To help prevent nausea and vomiting after your treatment, we encourage you to take your nausea medication as prescribed.    If you develop nausea and vomiting that is not controlled by your nausea medication, call the clinic.   BELOW ARE SYMPTOMS THAT SHOULD BE REPORTED IMMEDIATELY:  *FEVER GREATER THAN 100.5 F  *CHILLS WITH OR WITHOUT FEVER  NAUSEA AND VOMITING THAT IS NOT CONTROLLED WITH YOUR NAUSEA MEDICATION  *UNUSUAL SHORTNESS OF BREATH  *UNUSUAL BRUISING OR BLEEDING  TENDERNESS IN MOUTH AND THROAT WITH OR WITHOUT PRESENCE OF ULCERS  *URINARY PROBLEMS  *BOWEL PROBLEMS  UNUSUAL RASH Items with * indicate a potential emergency and should be followed up as soon as possible.  Feel free to call the clinic you have any questions or concerns. The clinic phone number is [phone].    

## 2013-09-17 NOTE — Progress Notes (Signed)
Dr. Juliann Mule notified of patient tooth pain. MD visited patient in the infusion room and assessed mouth. Per Dr. Juliann Mule, hold Zometa, proceed with chemo, and see a dentist as soon as possible. Pt verbalized understanding and agrees with this plan. She will call Dr. Boyce Medici desk RN for dental referral.

## 2013-09-18 ENCOUNTER — Telehealth: Payer: Self-pay | Admitting: *Deleted

## 2013-09-18 NOTE — Telephone Encounter (Signed)
Spoke with pt for post chemo follow up call.  Pt stated she was doing fine.  Denied nausea/vomiting;  Stated good appetite, and drinking lots of fluids as tolerated.  Denied pain.  Bowel and bladder function fine. Pt stated she would call her dentis today and informed of her dental problems.  Instructed pt to let Dr. Boyce Medici nurse know of her dentist's instructions and/or plan for pt's dental work when pt comes for chemo pump disconnect on 09/19/13. Pt stated she had very good experience with her first chemo treatment yesterday.  Staff were courteous, nice, and provided explanations to pt throughout her treatment.  No other concerns at this time.

## 2013-09-19 ENCOUNTER — Other Ambulatory Visit: Payer: Self-pay | Admitting: *Deleted

## 2013-09-19 ENCOUNTER — Ambulatory Visit (HOSPITAL_BASED_OUTPATIENT_CLINIC_OR_DEPARTMENT_OTHER): Payer: Medicaid Other

## 2013-09-19 VITALS — BP 93/41 | HR 134 | Temp 97.7°F

## 2013-09-19 DIAGNOSIS — C7889 Secondary malignant neoplasm of other digestive organs: Secondary | ICD-10-CM

## 2013-09-19 DIAGNOSIS — C19 Malignant neoplasm of rectosigmoid junction: Secondary | ICD-10-CM

## 2013-09-19 DIAGNOSIS — C787 Secondary malignant neoplasm of liver and intrahepatic bile duct: Secondary | ICD-10-CM

## 2013-09-19 LAB — CBC WITH DIFFERENTIAL/PLATELET
BASO%: 0.4 % (ref 0.0–2.0)
BASOS ABS: 0 10*3/uL (ref 0.0–0.1)
EOS ABS: 0 10*3/uL (ref 0.0–0.5)
EOS%: 0 % (ref 0.0–7.0)
HCT: 34.8 % (ref 34.8–46.6)
HGB: 11.4 g/dL — ABNORMAL LOW (ref 11.6–15.9)
LYMPH#: 1.4 10*3/uL (ref 0.9–3.3)
LYMPH%: 16.6 % (ref 14.0–49.7)
MCH: 27.3 pg (ref 25.1–34.0)
MCHC: 32.8 g/dL (ref 31.5–36.0)
MCV: 83.5 fL (ref 79.5–101.0)
MONO#: 0.6 10*3/uL (ref 0.1–0.9)
MONO%: 7.4 % (ref 0.0–14.0)
NEUT%: 75.6 % (ref 38.4–76.8)
NEUTROS ABS: 6.2 10*3/uL (ref 1.5–6.5)
Platelets: 313 10*3/uL (ref 145–400)
RBC: 4.16 10*6/uL (ref 3.70–5.45)
RDW: 16.3 % — ABNORMAL HIGH (ref 11.2–14.5)
WBC: 8.2 10*3/uL (ref 3.9–10.3)

## 2013-09-19 LAB — COMPREHENSIVE METABOLIC PANEL (CC13)
ALBUMIN: 3.4 g/dL — AB (ref 3.5–5.0)
ALT: 13 U/L (ref 0–55)
AST: 17 U/L (ref 5–34)
Alkaline Phosphatase: 70 U/L (ref 40–150)
Anion Gap: 13 mEq/L — ABNORMAL HIGH (ref 3–11)
BUN: 19.4 mg/dL (ref 7.0–26.0)
CHLORIDE: 101 meq/L (ref 98–109)
CO2: 25 mEq/L (ref 22–29)
Calcium: 12.1 mg/dL — ABNORMAL HIGH (ref 8.4–10.4)
Creatinine: 0.9 mg/dL (ref 0.6–1.1)
GLUCOSE: 139 mg/dL (ref 70–140)
Potassium: 3.3 mEq/L — ABNORMAL LOW (ref 3.5–5.1)
Sodium: 139 mEq/L (ref 136–145)
Total Bilirubin: 0.31 mg/dL (ref 0.20–1.20)
Total Protein: 8.7 g/dL — ABNORMAL HIGH (ref 6.4–8.3)

## 2013-09-19 MED ORDER — SODIUM CHLORIDE 0.9 % IJ SOLN
10.0000 mL | INTRAMUSCULAR | Status: DC | PRN
  Administered 2013-09-19: 10 mL
  Filled 2013-09-19: qty 10

## 2013-09-19 MED ORDER — ONDANSETRON 8 MG/50ML IVPB (CHCC)
8.0000 mg | Freq: Once | INTRAVENOUS | Status: AC
  Administered 2013-09-19: 8 mg via INTRAVENOUS

## 2013-09-19 MED ORDER — SODIUM CHLORIDE 0.9 % IJ SOLN
10.0000 mL | INTRAMUSCULAR | Status: DC | PRN
  Administered 2013-09-19: 10 mL via INTRAVENOUS
  Filled 2013-09-19: qty 10

## 2013-09-19 MED ORDER — HEPARIN SOD (PORK) LOCK FLUSH 100 UNIT/ML IV SOLN
500.0000 [IU] | Freq: Once | INTRAVENOUS | Status: AC | PRN
  Administered 2013-09-19: 500 [IU]
  Filled 2013-09-19: qty 5

## 2013-09-19 MED ORDER — ONDANSETRON 8 MG/NS 50 ML IVPB
INTRAVENOUS | Status: AC
  Filled 2013-09-19: qty 8

## 2013-09-19 MED ORDER — HEPARIN SOD (PORK) LOCK FLUSH 100 UNIT/ML IV SOLN
500.0000 [IU] | Freq: Once | INTRAVENOUS | Status: AC
  Administered 2013-09-19: 500 [IU] via INTRAVENOUS
  Filled 2013-09-19: qty 5

## 2013-09-19 MED ORDER — SODIUM CHLORIDE 0.9 % IV SOLN
Freq: Once | INTRAVENOUS | Status: AC
  Administered 2013-09-19: 15:00:00 via INTRAVENOUS

## 2013-09-19 NOTE — Patient Instructions (Signed)
Dehydration, Adult  Dehydration means your body does not have as much fluid as it needs. Your kidneys, brain, and heart will not work properly without the right amount of fluids and salt.   HOME CARE   Ask your doctor how to replace body fluid losses (rehydrate).   Drink enough fluids to keep your pee (urine) clear or pale yellow.   Drink small amounts of fluids often if you feel sick to your stomach (nauseous) or throw up (vomit).   Eat like you normally do.   Avoid:   Foods or drinks high in sugar.   Bubbly (carbonated) drinks.   Juice.   Very hot or cold fluids.   Drinks with caffeine.   Fatty, greasy foods.   Alcohol.   Tobacco.   Eating too much.   Gelatin desserts.   Wash your hands to avoid spreading germs (bacteria, viruses).   Only take medicine as told by your doctor.   Keep all doctor visits as told.  GET HELP RIGHT AWAY IF:    You cannot drink something without throwing up.   You get worse even with treatment.   Your vomit has blood in it or looks greenish.   Your poop (stool) has blood in it or looks black and tarry.   You have not peed in 6 to 8 hours.   You pee a small amount of very dark pee.   You have a fever.   You pass out (faint).   You have belly (abdominal) pain that gets worse or stays in one spot (localizes).   You have a rash, stiff neck, or bad headache.   You get easily annoyed, sleepy, or are hard to wake up.   You feel weak, dizzy, or very thirsty.  MAKE SURE YOU:    Understand these instructions.   Will watch your condition.   Will get help right away if you are not doing well or get worse.  Document Released: 04/17/2009 Document Revised: 09/13/2011 Document Reviewed: 02/08/2011  [employer] Patient Information 2014 [employer].

## 2013-09-19 NOTE — Progress Notes (Signed)
Pt complains of nausea and vomiting. States she has had more than 5 episodes of vomiting since last night. BP 93/41. Heart rate 134. Pt states she is feeling weak. Dr Juliann Mule made aware of pt condition. New orders obtained for CBC, CMET, 1 liter bolus of NS over 1-2 hours, and zofran 8mg  IV. Pump DC'd and labs drawn via port a cath.  Pt taken to infusion for fluids as ordered.

## 2013-09-28 ENCOUNTER — Other Ambulatory Visit: Payer: Self-pay | Admitting: Internal Medicine

## 2013-09-28 ENCOUNTER — Telehealth: Payer: Self-pay | Admitting: *Deleted

## 2013-09-28 ENCOUNTER — Other Ambulatory Visit: Payer: Self-pay | Admitting: *Deleted

## 2013-09-28 DIAGNOSIS — C19 Malignant neoplasm of rectosigmoid junction: Secondary | ICD-10-CM

## 2013-09-28 DIAGNOSIS — K6289 Other specified diseases of anus and rectum: Secondary | ICD-10-CM

## 2013-09-28 MED ORDER — LORAZEPAM 0.5 MG PO TABS: ORAL_TABLET | ORAL | Status: DC

## 2013-09-28 NOTE — Telephone Encounter (Signed)
Pt called and left message requesting refill of Lorazepam 0.5mg  - take 1 tab po every 6 hours as needed for nausea/vomiting.   Spoke with pt and was informed that Lorazepam really helps pt from feeling sick from chemo.   [place] to Dr. Juliann Mule. Pt's  Phone    [phone].

## 2013-09-29 ENCOUNTER — Other Ambulatory Visit: Payer: Self-pay | Admitting: *Deleted

## 2013-09-30 ENCOUNTER — Other Ambulatory Visit: Payer: Self-pay | Admitting: Internal Medicine

## 2013-10-01 ENCOUNTER — Ambulatory Visit (HOSPITAL_BASED_OUTPATIENT_CLINIC_OR_DEPARTMENT_OTHER): Payer: Medicaid Other

## 2013-10-01 ENCOUNTER — Other Ambulatory Visit (HOSPITAL_BASED_OUTPATIENT_CLINIC_OR_DEPARTMENT_OTHER): Payer: Medicaid Other

## 2013-10-01 ENCOUNTER — Other Ambulatory Visit: Payer: Self-pay | Admitting: Medical Oncology

## 2013-10-01 ENCOUNTER — Other Ambulatory Visit: Payer: Self-pay | Admitting: Physician Assistant

## 2013-10-01 VITALS — BP 133/87 | HR 90 | Temp 98.1°F

## 2013-10-01 DIAGNOSIS — C787 Secondary malignant neoplasm of liver and intrahepatic bile duct: Secondary | ICD-10-CM

## 2013-10-01 DIAGNOSIS — C19 Malignant neoplasm of rectosigmoid junction: Secondary | ICD-10-CM

## 2013-10-01 DIAGNOSIS — Z5111 Encounter for antineoplastic chemotherapy: Secondary | ICD-10-CM

## 2013-10-01 DIAGNOSIS — C7889 Secondary malignant neoplasm of other digestive organs: Secondary | ICD-10-CM

## 2013-10-01 LAB — COMPREHENSIVE METABOLIC PANEL (CC13)
ALBUMIN: 3.2 g/dL — AB (ref 3.5–5.0)
ALK PHOS: 66 U/L (ref 40–150)
ALT: 13 U/L (ref 0–55)
AST: 21 U/L (ref 5–34)
Anion Gap: 7 mEq/L (ref 3–11)
BUN: 10.4 mg/dL (ref 7.0–26.0)
CO2: 22 mEq/L (ref 22–29)
Calcium: 11.2 mg/dL — ABNORMAL HIGH (ref 8.4–10.4)
Chloride: 110 mEq/L — ABNORMAL HIGH (ref 98–109)
Creatinine: 0.7 mg/dL (ref 0.6–1.1)
Glucose: 96 mg/dl (ref 70–140)
POTASSIUM: 4.2 meq/L (ref 3.5–5.1)
SODIUM: 139 meq/L (ref 136–145)
TOTAL PROTEIN: 7.9 g/dL (ref 6.4–8.3)
Total Bilirubin: 0.2 mg/dL (ref 0.20–1.20)

## 2013-10-01 LAB — CBC WITH DIFFERENTIAL/PLATELET
BASO%: 0.2 % (ref 0.0–2.0)
BASOS ABS: 0 10*3/uL (ref 0.0–0.1)
EOS ABS: 0.2 10*3/uL (ref 0.0–0.5)
EOS%: 3.2 % (ref 0.0–7.0)
HCT: 32 % — ABNORMAL LOW (ref 34.8–46.6)
HEMOGLOBIN: 10.5 g/dL — AB (ref 11.6–15.9)
LYMPH#: 1.4 10*3/uL (ref 0.9–3.3)
LYMPH%: 26.5 % (ref 14.0–49.7)
MCH: 27.3 pg (ref 25.1–34.0)
MCHC: 32.8 g/dL (ref 31.5–36.0)
MCV: 83.1 fL (ref 79.5–101.0)
MONO#: 0.4 10*3/uL (ref 0.1–0.9)
MONO%: 7.4 % (ref 0.0–14.0)
NEUT%: 62.7 % (ref 38.4–76.8)
NEUTROS ABS: 3.4 10*3/uL (ref 1.5–6.5)
Platelets: 247 10*3/uL (ref 145–400)
RBC: 3.85 10*6/uL (ref 3.70–5.45)
RDW: 15.3 % — AB (ref 11.2–14.5)
WBC: 5.4 10*3/uL (ref 3.9–10.3)
nRBC: 0 % (ref 0–0)

## 2013-10-01 MED ORDER — ONDANSETRON 16 MG/50ML IVPB (CHCC)
INTRAVENOUS | Status: AC
  Filled 2013-10-01: qty 16

## 2013-10-01 MED ORDER — ATROPINE SULFATE 1 MG/ML IJ SOLN
0.5000 mg | Freq: Once | INTRAMUSCULAR | Status: AC | PRN
  Administered 2013-10-01: 0.5 mg via INTRAVENOUS

## 2013-10-01 MED ORDER — SODIUM CHLORIDE 0.9 % IV SOLN
Freq: Once | INTRAVENOUS | Status: AC
  Administered 2013-10-01: 12:00:00 via INTRAVENOUS

## 2013-10-01 MED ORDER — DEXAMETHASONE SODIUM PHOSPHATE 20 MG/5ML IJ SOLN
INTRAMUSCULAR | Status: AC
  Filled 2013-10-01: qty 5

## 2013-10-01 MED ORDER — DEXAMETHASONE SODIUM PHOSPHATE 20 MG/5ML IJ SOLN
20.0000 mg | Freq: Once | INTRAMUSCULAR | Status: AC
  Administered 2013-10-01: 20 mg via INTRAVENOUS

## 2013-10-01 MED ORDER — ONDANSETRON 16 MG/50ML IVPB (CHCC)
16.0000 mg | Freq: Once | INTRAVENOUS | Status: AC
  Administered 2013-10-01: 16 mg via INTRAVENOUS

## 2013-10-01 MED ORDER — FLUOROURACIL CHEMO INJECTION 2.5 GM/50ML
400.0000 mg/m2 | Freq: Once | INTRAVENOUS | Status: AC
  Administered 2013-10-01: 700 mg via INTRAVENOUS
  Filled 2013-10-01: qty 14

## 2013-10-01 MED ORDER — SODIUM CHLORIDE 0.9 % IV SOLN
2400.0000 mg/m2 | INTRAVENOUS | Status: DC
  Administered 2013-10-01: 4300 mg via INTRAVENOUS
  Filled 2013-10-01: qty 86

## 2013-10-01 MED ORDER — ATROPINE SULFATE 1 MG/ML IJ SOLN
INTRAMUSCULAR | Status: AC
  Filled 2013-10-01: qty 1

## 2013-10-01 MED ORDER — LEUCOVORIN CALCIUM INJECTION 350 MG
391.0000 mg/m2 | Freq: Once | INTRAVENOUS | Status: AC
  Administered 2013-10-01: 700 mg via INTRAVENOUS
  Filled 2013-10-01: qty 35

## 2013-10-01 MED ORDER — IRINOTECAN HCL CHEMO INJECTION 100 MG/5ML
179.0000 mg/m2 | Freq: Once | INTRAVENOUS | Status: AC
  Administered 2013-10-01: 320 mg via INTRAVENOUS
  Filled 2013-10-01: qty 16

## 2013-10-01 NOTE — Patient Instructions (Signed)
[facility] Discharge Instructions for Patients Receiving Chemotherapy  Today you received the following chemotherapy agents leucovorin/irinotecan/fluorouracil  To help prevent nausea and vomiting after your treatment, we encourage you to take your nausea medication as directed   If you develop nausea and vomiting that is not controlled by your nausea medication, call the clinic.   BELOW ARE SYMPTOMS THAT SHOULD BE REPORTED IMMEDIATELY:  *FEVER GREATER THAN 100.5 F  *CHILLS WITH OR WITHOUT FEVER  NAUSEA AND VOMITING THAT IS NOT CONTROLLED WITH YOUR NAUSEA MEDICATION  *UNUSUAL SHORTNESS OF BREATH  *UNUSUAL BRUISING OR BLEEDING  TENDERNESS IN MOUTH AND THROAT WITH OR WITHOUT PRESENCE OF ULCERS  *URINARY PROBLEMS  *BOWEL PROBLEMS  UNUSUAL RASH Items with * indicate a potential emergency and should be followed up as soon as possible.  Feel free to call the clinic you have any questions or concerns. The clinic phone number is [phone]. 

## 2013-10-03 ENCOUNTER — Ambulatory Visit (HOSPITAL_BASED_OUTPATIENT_CLINIC_OR_DEPARTMENT_OTHER): Payer: Medicaid Other

## 2013-10-03 VITALS — BP 105/66 | HR 100 | Temp 97.3°F | Resp 17

## 2013-10-03 DIAGNOSIS — C19 Malignant neoplasm of rectosigmoid junction: Secondary | ICD-10-CM

## 2013-10-03 DIAGNOSIS — C787 Secondary malignant neoplasm of liver and intrahepatic bile duct: Secondary | ICD-10-CM

## 2013-10-03 DIAGNOSIS — C7889 Secondary malignant neoplasm of other digestive organs: Secondary | ICD-10-CM

## 2013-10-03 MED ORDER — SODIUM CHLORIDE 0.9 % IJ SOLN
10.0000 mL | INTRAMUSCULAR | Status: DC | PRN
  Administered 2013-10-03: 10 mL
  Filled 2013-10-03: qty 10

## 2013-10-03 MED ORDER — HEPARIN SOD (PORK) LOCK FLUSH 100 UNIT/ML IV SOLN
500.0000 [IU] | Freq: Once | INTRAVENOUS | Status: AC | PRN
  Administered 2013-10-03: 500 [IU]
  Filled 2013-10-03: qty 5

## 2013-10-15 ENCOUNTER — Ambulatory Visit (HOSPITAL_BASED_OUTPATIENT_CLINIC_OR_DEPARTMENT_OTHER): Payer: Medicaid Other

## 2013-10-15 ENCOUNTER — Ambulatory Visit: Payer: Medicaid Other | Admitting: Nutrition

## 2013-10-15 ENCOUNTER — Other Ambulatory Visit: Payer: Self-pay | Admitting: Internal Medicine

## 2013-10-15 ENCOUNTER — Ambulatory Visit (HOSPITAL_BASED_OUTPATIENT_CLINIC_OR_DEPARTMENT_OTHER): Payer: Medicaid Other | Admitting: Physician Assistant

## 2013-10-15 ENCOUNTER — Ambulatory Visit: Payer: Medicaid Other

## 2013-10-15 ENCOUNTER — Encounter: Payer: Self-pay | Admitting: Physician Assistant

## 2013-10-15 VITALS — BP 141/55 | HR 102 | Temp 98.1°F | Resp 18 | Ht 62.0 in | Wt 149.1 lb

## 2013-10-15 DIAGNOSIS — C787 Secondary malignant neoplasm of liver and intrahepatic bile duct: Secondary | ICD-10-CM

## 2013-10-15 DIAGNOSIS — Z452 Encounter for adjustment and management of vascular access device: Secondary | ICD-10-CM

## 2013-10-15 DIAGNOSIS — Z95828 Presence of other vascular implants and grafts: Secondary | ICD-10-CM

## 2013-10-15 DIAGNOSIS — Z5111 Encounter for antineoplastic chemotherapy: Secondary | ICD-10-CM

## 2013-10-15 DIAGNOSIS — C19 Malignant neoplasm of rectosigmoid junction: Secondary | ICD-10-CM

## 2013-10-15 DIAGNOSIS — C7889 Secondary malignant neoplasm of other digestive organs: Secondary | ICD-10-CM

## 2013-10-15 LAB — CBC WITH DIFFERENTIAL/PLATELET
BASO%: 0.8 % (ref 0.0–2.0)
Basophils Absolute: 0 10*3/uL (ref 0.0–0.1)
EOS%: 3 % (ref 0.0–7.0)
Eosinophils Absolute: 0.2 10*3/uL (ref 0.0–0.5)
HCT: 37 % (ref 34.8–46.6)
HGB: 12 g/dL (ref 11.6–15.9)
LYMPH%: 23.7 % (ref 14.0–49.7)
MCH: 27.4 pg (ref 25.1–34.0)
MCHC: 32.3 g/dL (ref 31.5–36.0)
MCV: 84.7 fL (ref 79.5–101.0)
MONO#: 0.3 10*3/uL (ref 0.1–0.9)
MONO%: 5.8 % (ref 0.0–14.0)
NEUT#: 3.5 10*3/uL (ref 1.5–6.5)
NEUT%: 66.7 % (ref 38.4–76.8)
PLATELETS: 316 10*3/uL (ref 145–400)
RBC: 4.37 10*6/uL (ref 3.70–5.45)
RDW: 16.5 % — ABNORMAL HIGH (ref 11.2–14.5)
WBC: 5.3 10*3/uL (ref 3.9–10.3)
lymph#: 1.3 10*3/uL (ref 0.9–3.3)

## 2013-10-15 LAB — COMPREHENSIVE METABOLIC PANEL (CC13)
ALK PHOS: 67 U/L (ref 40–150)
ALT: 11 U/L (ref 0–55)
ANION GAP: 9 meq/L (ref 3–11)
AST: 18 U/L (ref 5–34)
Albumin: 3.5 g/dL (ref 3.5–5.0)
BILIRUBIN TOTAL: 0.22 mg/dL (ref 0.20–1.20)
BUN: 13.9 mg/dL (ref 7.0–26.0)
CO2: 23 meq/L (ref 22–29)
CREATININE: 0.8 mg/dL (ref 0.6–1.1)
Calcium: 11.9 mg/dL — ABNORMAL HIGH (ref 8.4–10.4)
Chloride: 107 mEq/L (ref 98–109)
Glucose: 87 mg/dl (ref 70–140)
Potassium: 4 mEq/L (ref 3.5–5.1)
Sodium: 139 mEq/L (ref 136–145)
Total Protein: 8.3 g/dL (ref 6.4–8.3)

## 2013-10-15 LAB — CEA: CEA: 22.3 ng/mL — ABNORMAL HIGH (ref 0.0–5.0)

## 2013-10-15 MED ORDER — SODIUM CHLORIDE 0.9 % IV SOLN
2400.0000 mg/m2 | INTRAVENOUS | Status: DC
  Administered 2013-10-15: 4300 mg via INTRAVENOUS
  Filled 2013-10-15: qty 86

## 2013-10-15 MED ORDER — FLUOROURACIL CHEMO INJECTION 2.5 GM/50ML
400.0000 mg/m2 | Freq: Once | INTRAVENOUS | Status: AC
  Administered 2013-10-15: 700 mg via INTRAVENOUS
  Filled 2013-10-15: qty 14

## 2013-10-15 MED ORDER — HEPARIN SOD (PORK) LOCK FLUSH 100 UNIT/ML IV SOLN
500.0000 [IU] | Freq: Once | INTRAVENOUS | Status: DC
  Filled 2013-10-15: qty 5

## 2013-10-15 MED ORDER — LEUCOVORIN CALCIUM INJECTION 350 MG
391.0000 mg/m2 | Freq: Once | INTRAVENOUS | Status: AC
  Administered 2013-10-15: 700 mg via INTRAVENOUS
  Filled 2013-10-15: qty 35

## 2013-10-15 MED ORDER — ATROPINE SULFATE 1 MG/ML IJ SOLN
INTRAMUSCULAR | Status: AC
  Filled 2013-10-15: qty 1

## 2013-10-15 MED ORDER — SODIUM CHLORIDE 0.9 % IV SOLN
Freq: Once | INTRAVENOUS | Status: AC
  Administered 2013-10-15: 11:00:00 via INTRAVENOUS

## 2013-10-15 MED ORDER — DEXAMETHASONE SODIUM PHOSPHATE 20 MG/5ML IJ SOLN
INTRAMUSCULAR | Status: AC
  Filled 2013-10-15: qty 5

## 2013-10-15 MED ORDER — ALTEPLASE 2 MG IJ SOLR
2.0000 mg | Freq: Once | INTRAMUSCULAR | Status: AC
  Administered 2013-10-15: 2 mg
  Filled 2013-10-15: qty 2

## 2013-10-15 MED ORDER — ATROPINE SULFATE 1 MG/ML IJ SOLN
0.5000 mg | Freq: Once | INTRAMUSCULAR | Status: AC | PRN
  Administered 2013-10-15: 0.5 mg via INTRAVENOUS

## 2013-10-15 MED ORDER — DEXAMETHASONE SODIUM PHOSPHATE 20 MG/5ML IJ SOLN
20.0000 mg | Freq: Once | INTRAMUSCULAR | Status: AC
  Administered 2013-10-15: 20 mg via INTRAVENOUS

## 2013-10-15 MED ORDER — ONDANSETRON 16 MG/50ML IVPB (CHCC)
16.0000 mg | Freq: Once | INTRAVENOUS | Status: AC
  Administered 2013-10-15: 16 mg via INTRAVENOUS

## 2013-10-15 MED ORDER — IRINOTECAN HCL CHEMO INJECTION 100 MG/5ML
179.0000 mg/m2 | Freq: Once | INTRAVENOUS | Status: AC
  Administered 2013-10-15: 320 mg via INTRAVENOUS
  Filled 2013-10-15: qty 16

## 2013-10-15 MED ORDER — ONDANSETRON 16 MG/50ML IVPB (CHCC)
INTRAVENOUS | Status: AC
  Filled 2013-10-15: qty 16

## 2013-10-15 MED ORDER — SODIUM CHLORIDE 0.9 % IJ SOLN
10.0000 mL | INTRAMUSCULAR | Status: DC | PRN
  Administered 2013-10-15: 10 mL via INTRAVENOUS
  Filled 2013-10-15: qty 10

## 2013-10-15 NOTE — Patient Instructions (Signed)

## 2013-10-15 NOTE — Progress Notes (Signed)
Patient add on for lab draw via [employer]. PAC accessed and flushed without difficulty. Minimal blood return noted with flush. Less than 4 mL blood return noted. Patient remained accessed and Awilda Metro, PA's nurse made aware.

## 2013-10-15 NOTE — Patient Instructions (Signed)
[facility] Discharge Instructions for Patients Receiving Chemotherapy  Today you received the following chemotherapy agents: Irinotecan, Leucovorin, 5FU   To help prevent nausea and vomiting after your treatment, we encourage you to take your nausea medication as prescribed.    If you develop nausea and vomiting that is not controlled by your nausea medication, call the clinic.   BELOW ARE SYMPTOMS THAT SHOULD BE REPORTED IMMEDIATELY:  *FEVER GREATER THAN 100.5 F  *CHILLS WITH OR WITHOUT FEVER  NAUSEA AND VOMITING THAT IS NOT CONTROLLED WITH YOUR NAUSEA MEDICATION  *UNUSUAL SHORTNESS OF BREATH  *UNUSUAL BRUISING OR BLEEDING  TENDERNESS IN MOUTH AND THROAT WITH OR WITHOUT PRESENCE OF ULCERS  *URINARY PROBLEMS  *BOWEL PROBLEMS  UNUSUAL RASH Items with * indicate a potential emergency and should be followed up as soon as possible.  Feel free to call the clinic you have any questions or concerns. The clinic phone number is [phone].    

## 2013-10-15 NOTE — Progress Notes (Signed)
[name] OFFICE PROGRESS NOTE  [name], [name], MD [place] [address]  DIAGNOSIS: Colorectal cancer, stage IV - Plan: CBC with Differential, Comprehensive metabolic panel (Cmet) - [facility]  No chief complaint on file.   CURRENT TREATMENT: Folfox q 14 weeks.      Colorectal cancer, stage IV   05/22/2013 - 05/27/2013 Hospital Admission A/w abdominal distention and constipation for 2 days CT scan of her abdomen and pelvis showed area of bowel wall thickening at the rectosigmoid junction concerning for malignancy.   05/22/2013 Imaging CT of Abdomen: bowel wall thickening with shouldering of the proximal and distal margins, involving therectosigmoid junction, supicous liver spleen mets. Colonsocopy recommended.    05/23/2013 Tumor Marker CEA 225.7   05/24/2013 Imaging MRI of abdomen: 3.7 x 4.4 cm enhancing lesion along the superior spleen, indeterminate but worrisome for metastasis.2.7 x 3.9 cm enhancing lesion in the medial segment left hepatic lobe,    05/25/2013 Pathology Diagnosis Rectum, biopsy, proximal - INVASIVE ADENOCARCINOMA.   05/25/2013 Procedure Flex sig: (Dr. Ardis Hughs). 5-6 cm obstructing rectosigmoid mass distal end 10 cm from anal verge; mass biopsed and then stented 9 cm long, 22 cm diameter uncovered SEM.    05/25/2013 Initial Diagnosis Colorectal cancer, stage IV   06/25/2013 Procedure R port a cath placement.   07/09/2013 - 08/20/2013 Chemotherapy FOLFOX q 2 weeks started.  Not elgible for clinical trial and bevacimab based on stent and high risk for perforation.    07/30/2013 Tumor Marker KRAS positive.  p53, APC identified. Mismatch Repair (MMR) preserved.    08/06/2013 Adverse Reaction Complains of darkening of her hands with peeling.    08/27/2013 Family History She was seen by Genetic couselor.  She declined testing today, but will call and reschedule an appointment should she decide to pursue testing.   09/07/2013 Imaging CT abdomen: Mixed response to  therapy. Response to therapy of hepatic metastasis. right hepatic hemangiomas are again identified. Other too small to characterize liver lesions are felt to be similar but are indeterminate.. Enlargement of a splenic lesions   09/13/2013 Tumor Marker CEA 33.5   09/13/2013 Treatment Plan Change Reviewed scans consistent with mixed response.  Switch to FOLFIRI.  Provided script for immodium.      INTERVAL HISTORY: [name] 63 y.o. female with a history of Stage IV colon cancer is here for follow-up.  She was last seen by Dr. Juliann Mule on 09/13/2013.   That she is tolerating this chemotherapy well.she is not having any issues with temperature intolerance. She occasionally has some hot flashes. She has no nausea or vomiting outside the few days surrounding chemotherapy. This is well managed with lorazepam and ondansetron. Today, she reports her bowel movements are regular and that she is with minimal pain. She denies any fevers or chills. She also reports that her weight is stable with an adequate appetite. She is very active and handles her basic, intermediate and advanced ADLs without difficulties.   She reports good energy and appetite.     MEDICAL HISTORY: Past Medical History  Diagnosis Date  . Colon cancer   . Anxiety     INTERIM HISTORY: has Rectal mass; Colon obstruction; Hypercalcemia; Colorectal cancer, stage IV; Family history of colorectal cancer; Thrush, oral; and Tachycardia on her problem list.    ALLERGIES:  is allergic to codeine.  MEDICATIONS: has a current medication list which includes the following prescription(s): cyanocobalamin, lidocaine-prilocaine, lorazepam, polyethylene glycol, potassium, loperamide, omeprazole, and tramadol.  SURGICAL HISTORY:  Past Surgical History  Procedure Laterality Date  . Abdominal hysterectomy  2005  . Cesarean section  1976  . Flexible sigmoidoscopy N/A 05/25/2013    Procedure: FLEXIBLE SIGMOIDOSCOPY;  Surgeon: [name], MD;   Location: [facility];  Service: Endoscopy;  Laterality: N/A;  needs floro  . Colonic stent placement N/A 05/25/2013    Procedure: COLONIC STENT PLACEMENT;  Surgeon: [name], MD;  Location: [facility];  Service: Endoscopy;  Laterality: N/A;  . Portacath placement Right 06/25/2013    Procedure: ULTRA SOUND GUIDED INSERTION PORT-A-CATH;  Surgeon: [name], MD;  Location: [name];  Service: General;  Laterality: Right;    REVIEW OF SYSTEMS:   Constitutional: Denies fevers, chills or abnormal weight loss Eyes: Denies blurriness of vision Ears, nose, mouth, throat, and face: Denies mucositis or sore throat Respiratory: Denies cough, dyspnea or wheezes Cardiovascular: Denies palpitation, chest discomfort or lower extremity swelling Gastrointestinal:  Denies nausea, heartburn or change in bowel habits Skin: Denies abnormal skin rashes Lymphatics: Denies new lymphadenopathy or easy bruising Neurological:Denies numbness, tingling or new weaknesses Behavioral/Psych: Mood is stable, no new changes  All other systems were reviewed with the patient and are negative.  PHYSICAL EXAMINATION: ECOG PERFORMANCE STATUS: 0 - Asymptomatic  Blood pressure 141/55, pulse 102, temperature 98.1 F (36.7 C), temperature source Oral, resp. rate 18, height '5\' 2"'  (1.575 m), weight 149 lb 1.6 oz (67.631 kg), SpO2 100.00%.  GENERAL:alert, no distress and comfortable; well-developed, well nourished.  SKIN: skin color, texture, turgor are normal, no rashes or significant lesions; + R Port a cath without tenderness  EYES: normal, Conjunctiva are pink and non-injected, sclera clear  OROPHARYNX:no exudate, no erythema and lips NECK: supple, thyroid normal size, non-tender, without nodularity  LYMPH: no palpable lymphadenopathy in the cervical, axillary or supraclavicular  LUNGS: clear to auscultation and percussion with normal breathing effort  HEART: Tachycardic with regular rhythm  and no murmurs and no lower extremity edema  ABDOMEN:abdomen soft, non-tender and normal bowel sounds; + mild distention  Musculoskeletal:no cyanosis of digits and no clubbing  NEURO: alert & oriented x 3 with fluent speech, no focal motor/sensory deficits  Labs:  Lab Results  Component Value Date   WBC 5.3 10/15/2013   HGB 12.0 10/15/2013   HCT 37.0 10/15/2013   MCV 84.7 10/15/2013   PLT 316 10/15/2013   NEUTROABS 3.5 10/15/2013      Chemistry      Component Value Date/Time   NA 139 10/01/2013 1056   NA 138 07/24/2013 1808   K 4.2 10/01/2013 1056   K 4.7 07/24/2013 1808   CL 101 07/24/2013 1808   CO2 22 10/01/2013 1056   CO2 23 07/24/2013 1808   BUN 10.4 10/01/2013 1056   BUN 20 07/24/2013 1808   CREATININE 0.7 10/01/2013 1056   CREATININE 0.76 07/24/2013 1808      Component Value Date/Time   CALCIUM 11.2* 10/01/2013 1056   CALCIUM 12.6* 07/24/2013 1808   ALKPHOS 66 10/01/2013 1056   ALKPHOS 89 05/22/2013 0930   AST 21 10/01/2013 1056   AST 26 05/22/2013 0930   ALT 13 10/01/2013 1056   ALT 15 05/22/2013 0930   BILITOT 0.20 10/01/2013 1056   BILITOT 0.2* 05/22/2013 0930       Basic Metabolic Panel: No results found for this basename: NA, K, CL, CO2, GLUCOSE, BUN, CREATININE, CALCIUM, MG, PHOS,  in the last 168 hours GFR Estimated Creatinine Clearance: 66.6 ml/min (by C-G formula based  on Cr of 0.7). Liver Function Tests: No results found for this basename: AST, ALT, ALKPHOS, BILITOT, PROT, ALBUMIN,  in the last 168 hours  CBC:  Recent Labs Lab 10/15/13 0828  WBC 5.3  NEUTROABS 3.5  HGB 12.0  HCT 37.0  MCV 84.7  PLT 316   Results for KADELYN, DIMASCIO (MRN [id number]) as of 09/15/2013 13:05  Ref. Range 05/23/2013 20:48 06/04/2013 11:13 07/02/2013 09:54 07/23/2013 09:46 09/13/2013 08:30  CEA Latest Range: 0.0-5.0 ng/mL 225.7 (H) 140.5 (H) 272.3 (H) 286.2 (H) 33.5 (H)   Studies:  No results found.   RADIOGRAPHIC STUDIES: Ct Abdomen Pelvis W Contrast (Reviewed personally by  Dr. Juliann Mule)  09/07/2013   CLINICAL DATA:  Colon cancer diagnosed in 2014. Chemotherapy in progress. Colonic stent in place. Stage 4 colon cancer.  EXAM: CT ABDOMEN AND PELVIS WITH CONTRAST  TECHNIQUE: Multidetector CT imaging of the abdomen and pelvis was performed using the standard protocol following bolus administration of intravenous contrast.  CONTRAST:  14m OMNIPAQUE IOHEXOL 300 MG/ML  SOLN  COMPARISON:  MR ABDOMEN WO/W CM dated 05/24/2013; CT ABD/PELVIS W CM dated 05/22/2013  FINDINGS: Lower Chest: 6 mm left lower lobe lung nodule on image 1 which is unchanged. Normal heart size without pericardial or pleural effusion. Fluid level in a distal esophagus on image 13.  Abdomen/Pelvis: Too small to characterize posterior right hepatic lobe lesion on image 15 is unchanged. Giant hemangioma in the right lobe is similar at greater than 7.5 cm. More cephalad right liver lobe hemangioma is again identified at 1.8 cm.  Suspect a too small to characterize posterior right hepatic lobe lesion on image 17, likely similar on the prior.  The left hepatic lobe metastasis is decreased. Measures 1.6 x 1.8 cm on image 16 versus 3.3 x 2.9 cm on the prior exam.  Splenule. An anterior splenic mass measures 4.4 x 4.8 cm and is enlarged from a 4.5 x 4.0 cm on the prior.  Normal stomach, pancreas, gallbladder, biliary tract, adrenal glands, right kidney. Left nephrolithiasis. Too small to characterize interpolar left renal lesion. Adenopathy along the inferior mesenteric artery is at the root of the sigmoid mesocolon. 1.0 cm on image 51, new. Mildly prominent preaortic node on image 45 is also new.  A stent has been placed across the previously described rectosigmoid mass. No surrounding fluid collection or other acute complication. Large proximal colonic stool burden, suggesting a component of constipation. Normal terminal ileum and appendix. Normal small bowel caliber. Ventral abdominal wall laxity containing nonobstructive small  bowel on image 48.  No pelvic sidewall adenopathy. Normal urinary bladder. Hysterectomy. No adnexal mass. No significant free fluid.  Bones/Musculoskeletal: Degenerative changes about the symphysis pubis. Degenerative disc disease at the lumbosacral junction.  IMPRESSION: 1. Mixed response to therapy. 2. Response to therapy of hepatic metastasis. 2 right hepatic hemangiomas are again identified. Other too small to characterize liver lesions are felt to be similar but are indeterminate. 3. Enlargement of a splenic lesion, suspicious for metastasis. 4. Development of adenopathy in the sigmoid mesocolon and low retroperitoneum, highly suspicious for nodal metastasis. 5. Indeterminate left lung base nodule, unchanged 6. Esophageal air fluid level suggests dysmotility or gastroesophageal reflux. 7. Left nephrolithiasis.   Electronically Signed   By: KAbigail MiyamotoM.D.   On: 09/07/2013 09:39    ASSESSMENT: CMAECI KALBFLEISCH89y.o. female with a history of Colorectal cancer, stage IV - Plan: CBC with Differential, Comprehensive metabolic panel (Cmet) - [facility]   ASSESSMENT: 63yo  AAF with family history of colon cancer, admitted with obstructive mass s/p stent and biopsy on 11/21 consistent with metastatic colon cancer with mets to liver, spleen. ECOG 0. Elevated CEA.  PLAN:  1. 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  -- Today we reviewed her recent CT of abdomen which is consistent with mixed response to therapy.  We will change to FOLFIRI.   There is no difference in overall response rate, time to progression and overall survival for patients treated with FOLFIRI versus FOLFOX4 in the treatment of advanced colorectal cancer. [facility], 2005, Aug 1; 23(22): 7948-01. For FOLFIRI and FOLFOX respectively, the median to to progression was 7 months versus 7 months; duration of response was 9 versus 10 months; overall survival was 14 versus 15  months and overall response rates were 31% versus 34%. Other potential therapies if progression is 5-FU/LV, CapeOx or capecitabine. Systemic chemotherapy with FOLFORI  Started on 09/17/2013 q 14 days to continue until progression or unacceptable toxicity.  It consists of the following:   Day # 1 Irinotecan 322 mg for 90 minutes  Leucovorin 400 mg/m2 for 2 hours before 5-FU d1  Fluorouracil 400 mg/m2 once starting 2.5 After treatment start time  Days #1/2 Fluorouracil 2,400 mg/m2 for 46 hours  Day #3 Pump D/C   The reference is from [place]. Al, Irinotecan combined with fluorouracil compared with fluororacil alone as first-line treatment for metastatic colorectal cancer: a multicenter randomised trial.  Lancet 2000 Apr 15; 355 (9212):  Per Dr. Boyce Medici note on 09/13/2013 an in depth discussion concerning the indications and side-effects of therapy was held. Side-effects includes but is not limited to bone marrow suppression (i.e., anemia, thrombocytopenia or leukopenia which can lead to life threatening infections), nausea or vomiting, gastrointestinal effects including diarrhea and electrolyte imbalances, neurotoxicity mainly from oxaliplatinum, liver and/or kidney dysfunction. We will not provide neulasta unless she has febrile neutropenia as risk of febrile neutropenia is less than 5%. We will follow labs prior to chemotherapy. She understood the indications, benefits and risk of therapy and chose to proceed.  We had planned to obtain restaging CT of abdomen pelvis s/p 2 cycles of FOLFORI. Patient had a CT of the abdomen and pelvis on 09/07/2013 revealing mixed response.   Currently status post 2 cycles. Laboratory data was reviewed and she'll proceed with cycle #3 today as scheduled without dose modifications.  2. Family history of Colon cancer.  --Based on her age and family history (sister deceased at age 59 from colon cancer), referral for genetic testing for Lynch syndrome, i.e. MSI or  IHC, would be appropriate. We made a referral to genetics. Her tumor was negative for lynch syndrome. She was seen on 02/23 but declined further testing for genetic syndromes presently.   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  --Parathyroid hormone elevated 192.5 and parathyroid hormone-related peptide pending. PTH is generally low when hypercalcemia is secondary to malignancy. In primary PTH, it is elevated. We counseled continue aggressive fluid hydration. She was instructed to report to emergency room with worsening constipation or confusion. We referred to endocrinology for further evaluation. She was seen by Dr. Dwyane Dee.  We will give intermittent zometa.    4. Follow-up.  --Patient instructed to follow-up 2 weeks for labs prior to consideration of next chemotherapy cycle. As she just had a CT of the abdomen and pelvis on 09/07/2013 revealing a mixed response to therapy,  Dr. Juliann Mule  will decide within the next series of restaging CT scans will be scheduled.  All questions were answered. The patient knows to call the clinic with any problems, questions or concerns. We can certainly see the patient much sooner if necessary.  Patient reviewed with Dr. Juliann Mule.  I spent 25 minutes counseling the patient face to face. The total time spent in the appointment was 35 minutes.    Carlton Adam, PA-C 10/15/2013 10:43 AM

## 2013-10-15 NOTE — Progress Notes (Signed)
Brief followup completed with patient during chemotherapy.  Patient feels well.  She has a good appetite.  Her taste has improved.  She is drinking a minimum of 64 ounces of water daily along with other liquids.  Her nausea and vomiting have improved.  Weight is stable and was documented as 149.1 pounds.  Patient has no new concerns.  Nutrition diagnosis: Food and nutrition related knowledge deficit improved.  Intervention: I encouraged patient to continue strategies for adequate oral intake with, high-protein foods as tolerated.  Educated patient to avoid spicy foods directly after chemotherapy as this can aggravate nausea.  Questions were answered.  Teach back method used.  Monitoring, evaluation, goals: Patient is tolerating adequate calories and protein for weight maintenance.  Her nausea has improved.  Next visit: Monday, April 27, during chemotherapy.

## 2013-10-16 NOTE — Patient Instructions (Signed)
Follow-up in 2 weeks

## 2013-10-17 ENCOUNTER — Other Ambulatory Visit: Payer: Self-pay | Admitting: Medical Oncology

## 2013-10-17 ENCOUNTER — Ambulatory Visit: Payer: Medicaid Other

## 2013-10-17 ENCOUNTER — Ambulatory Visit (HOSPITAL_BASED_OUTPATIENT_CLINIC_OR_DEPARTMENT_OTHER): Payer: Medicaid Other

## 2013-10-17 VITALS — BP 88/44 | HR 149 | Temp 97.6°F

## 2013-10-17 VITALS — BP 102/64 | HR 113

## 2013-10-17 DIAGNOSIS — C19 Malignant neoplasm of rectosigmoid junction: Secondary | ICD-10-CM

## 2013-10-17 MED ORDER — SODIUM CHLORIDE 0.9 % IV SOLN
Freq: Once | INTRAVENOUS | Status: AC
  Administered 2013-10-17: 14:00:00 via INTRAVENOUS

## 2013-10-17 MED ORDER — SODIUM CHLORIDE 0.9 % IJ SOLN
10.0000 mL | INTRAMUSCULAR | Status: DC | PRN
  Administered 2013-10-17: 10 mL via INTRAVENOUS
  Filled 2013-10-17: qty 10

## 2013-10-17 MED ORDER — HEPARIN SOD (PORK) LOCK FLUSH 100 UNIT/ML IV SOLN
500.0000 [IU] | Freq: Once | INTRAVENOUS | Status: AC | PRN
  Administered 2013-10-17: 500 [IU]
  Filled 2013-10-17: qty 5

## 2013-10-17 MED ORDER — SODIUM CHLORIDE 0.9 % IV SOLN
1000.0000 mL | INTRAVENOUS | Status: DC
  Administered 2013-10-17: 1000 mL via INTRAVENOUS

## 2013-10-17 MED ORDER — PROCHLORPERAZINE EDISYLATE 5 MG/ML IJ SOLN
10.0000 mg | Freq: Once | INTRAMUSCULAR | Status: AC
  Administered 2013-10-17: 10 mg via INTRAVENOUS

## 2013-10-17 MED ORDER — SODIUM CHLORIDE 0.9 % IJ SOLN
10.0000 mL | INTRAMUSCULAR | Status: DC | PRN
  Administered 2013-10-17: 10 mL
  Filled 2013-10-17: qty 10

## 2013-10-17 MED ORDER — PROCHLORPERAZINE EDISYLATE 5 MG/ML IJ SOLN
INTRAMUSCULAR | Status: AC
  Filled 2013-10-17: qty 2

## 2013-10-17 MED ORDER — HEPARIN SOD (PORK) LOCK FLUSH 100 UNIT/ML IV SOLN
500.0000 [IU] | Freq: Once | INTRAVENOUS | Status: AC
  Administered 2013-10-17: 500 [IU] via INTRAVENOUS
  Filled 2013-10-17: qty 5

## 2013-10-17 NOTE — Progress Notes (Signed)
Pt BP 80s/40s. Shirlean Mylar, RN made aware. Pump DC'd PAC left accessed.

## 2013-10-17 NOTE — Patient Instructions (Signed)
Dehydration, Adult  Dehydration means your body does not have as much fluid as it needs. Your kidneys, brain, and heart will not work properly without the right amount of fluids and salt.   HOME CARE   Ask your doctor how to replace body fluid losses (rehydrate).   Drink enough fluids to keep your pee (urine) clear or pale yellow.   Drink small amounts of fluids often if you feel sick to your stomach (nauseous) or throw up (vomit).   Eat like you normally do.   Avoid:   Foods or drinks high in sugar.   Bubbly (carbonated) drinks.   Juice.   Very hot or cold fluids.   Drinks with caffeine.   Fatty, greasy foods.   Alcohol.   Tobacco.   Eating too much.   Gelatin desserts.   Wash your hands to avoid spreading germs (bacteria, viruses).   Only take medicine as told by your doctor.   Keep all doctor visits as told.  GET HELP RIGHT AWAY IF:    You cannot drink something without throwing up.   You get worse even with treatment.   Your vomit has blood in it or looks greenish.   Your poop (stool) has blood in it or looks black and tarry.   You have not peed in 6 to 8 hours.   You pee a small amount of very dark pee.   You have a fever.   You pass out (faint).   You have belly (abdominal) pain that gets worse or stays in one spot (localizes).   You have a rash, stiff neck, or bad headache.   You get easily annoyed, sleepy, or are hard to wake up.   You feel weak, dizzy, or very thirsty.  MAKE SURE YOU:    Understand these instructions.   Will watch your condition.   Will get help right away if you are not doing well or get worse.  Document Released: 04/17/2009 Document Revised: 09/13/2011 Document Reviewed: 02/08/2011  [employer] Patient Information 2014 [employer].

## 2013-10-17 NOTE — Progress Notes (Signed)
Pt vomiting 750cc

## 2013-10-18 ENCOUNTER — Telehealth: Payer: Self-pay | Admitting: Internal Medicine

## 2013-10-18 NOTE — Telephone Encounter (Signed)
Sent a staff message to dr Juliann Mule for him to assign a spot for this pt on 10/29/2013.

## 2013-10-19 ENCOUNTER — Telehealth: Payer: Self-pay | Admitting: Internal Medicine

## 2013-10-19 ENCOUNTER — Encounter (HOSPITAL_COMMUNITY): Payer: Self-pay

## 2013-10-19 NOTE — Telephone Encounter (Signed)
S/w the pt and she is aware of her April appts.

## 2013-10-24 ENCOUNTER — Telehealth: Payer: Self-pay

## 2013-10-24 NOTE — Telephone Encounter (Signed)
Answering pt call of 1230 asking for a Rx refill. lvm to call us back and lvm with which medication she needs refilled, and we will work on it in the morning.

## 2013-10-25 ENCOUNTER — Other Ambulatory Visit: Payer: Self-pay

## 2013-10-25 DIAGNOSIS — C19 Malignant neoplasm of rectosigmoid junction: Secondary | ICD-10-CM

## 2013-10-25 MED ORDER — ONDANSETRON HCL 8 MG PO TABS: ORAL_TABLET | ORAL | Status: DC

## 2013-10-25 NOTE — Telephone Encounter (Signed)
lvm that we are refilling zofran. If she gets headache from zofran, she should call us back and we will prescribe compazine.

## 2013-10-29 ENCOUNTER — Other Ambulatory Visit: Payer: Self-pay | Admitting: Medical Oncology

## 2013-10-29 ENCOUNTER — Ambulatory Visit: Payer: Medicaid Other | Admitting: Nutrition

## 2013-10-29 ENCOUNTER — Telehealth: Payer: Self-pay | Admitting: *Deleted

## 2013-10-29 ENCOUNTER — Other Ambulatory Visit (HOSPITAL_BASED_OUTPATIENT_CLINIC_OR_DEPARTMENT_OTHER): Payer: Medicaid Other

## 2013-10-29 ENCOUNTER — Encounter: Payer: Self-pay | Admitting: Internal Medicine

## 2013-10-29 ENCOUNTER — Telehealth: Payer: Self-pay | Admitting: Internal Medicine

## 2013-10-29 ENCOUNTER — Ambulatory Visit (HOSPITAL_BASED_OUTPATIENT_CLINIC_OR_DEPARTMENT_OTHER): Payer: Medicaid Other

## 2013-10-29 ENCOUNTER — Ambulatory Visit (HOSPITAL_BASED_OUTPATIENT_CLINIC_OR_DEPARTMENT_OTHER): Payer: Medicaid Other | Admitting: Internal Medicine

## 2013-10-29 VITALS — BP 127/68 | HR 102 | Temp 97.9°F | Resp 18 | Ht 62.0 in | Wt 148.9 lb

## 2013-10-29 DIAGNOSIS — C787 Secondary malignant neoplasm of liver and intrahepatic bile duct: Secondary | ICD-10-CM

## 2013-10-29 DIAGNOSIS — R109 Unspecified abdominal pain: Secondary | ICD-10-CM

## 2013-10-29 DIAGNOSIS — C19 Malignant neoplasm of rectosigmoid junction: Secondary | ICD-10-CM

## 2013-10-29 DIAGNOSIS — Z800 Family history of malignant neoplasm of digestive organs: Secondary | ICD-10-CM

## 2013-10-29 DIAGNOSIS — Z5111 Encounter for antineoplastic chemotherapy: Secondary | ICD-10-CM

## 2013-10-29 LAB — COMPREHENSIVE METABOLIC PANEL (CC13)
ALT: 10 U/L (ref 0–55)
ANION GAP: 8 meq/L (ref 3–11)
AST: 15 U/L (ref 5–34)
Albumin: 3.2 g/dL — ABNORMAL LOW (ref 3.5–5.0)
Alkaline Phosphatase: 64 U/L (ref 40–150)
BUN: 13.3 mg/dL (ref 7.0–26.0)
CHLORIDE: 107 meq/L (ref 98–109)
CO2: 24 meq/L (ref 22–29)
CREATININE: 0.9 mg/dL (ref 0.6–1.1)
Calcium: 12.3 mg/dL — ABNORMAL HIGH (ref 8.4–10.4)
GLUCOSE: 96 mg/dL (ref 70–140)
Potassium: 4.6 mEq/L (ref 3.5–5.1)
Sodium: 139 mEq/L (ref 136–145)
Total Bilirubin: 0.22 mg/dL (ref 0.20–1.20)
Total Protein: 7.8 g/dL (ref 6.4–8.3)

## 2013-10-29 LAB — CBC WITH DIFFERENTIAL/PLATELET
BASO%: 0.2 % (ref 0.0–2.0)
Basophils Absolute: 0 10*3/uL (ref 0.0–0.1)
EOS ABS: 0.1 10*3/uL (ref 0.0–0.5)
EOS%: 2.5 % (ref 0.0–7.0)
HEMATOCRIT: 31.9 % — AB (ref 34.8–46.6)
HEMOGLOBIN: 10.3 g/dL — AB (ref 11.6–15.9)
LYMPH#: 1.5 10*3/uL (ref 0.9–3.3)
LYMPH%: 26.1 % (ref 14.0–49.7)
MCH: 27.5 pg (ref 25.1–34.0)
MCHC: 32.3 g/dL (ref 31.5–36.0)
MCV: 85.1 fL (ref 79.5–101.0)
MONO#: 0.5 10*3/uL (ref 0.1–0.9)
MONO%: 8.4 % (ref 0.0–14.0)
NEUT#: 3.5 10*3/uL (ref 1.5–6.5)
NEUT%: 62.8 % (ref 38.4–76.8)
PLATELETS: 294 10*3/uL (ref 145–400)
RBC: 3.75 10*6/uL (ref 3.70–5.45)
RDW: 15.5 % — ABNORMAL HIGH (ref 11.2–14.5)
WBC: 5.6 10*3/uL (ref 3.9–10.3)

## 2013-10-29 MED ORDER — POLYETHYLENE GLYCOL 3350 17 G PO PACK: 34.0000 g | PACK | Freq: Every day | ORAL | Status: DC | PRN

## 2013-10-29 MED ORDER — SODIUM CHLORIDE 0.9 % IV SOLN: Freq: Once | INTRAVENOUS | Status: DC

## 2013-10-29 MED ORDER — LEUCOVORIN CALCIUM INJECTION 350 MG
391.0000 mg/m2 | Freq: Once | INTRAVENOUS | Status: AC
  Administered 2013-10-29: 700 mg via INTRAVENOUS
  Filled 2013-10-29: qty 35

## 2013-10-29 MED ORDER — FLUOROURACIL CHEMO INJECTION 2.5 GM/50ML
400.0000 mg/m2 | Freq: Once | INTRAVENOUS | Status: AC
  Administered 2013-10-29: 700 mg via INTRAVENOUS
  Filled 2013-10-29: qty 14

## 2013-10-29 MED ORDER — SODIUM CHLORIDE 0.9 % IV SOLN
2400.0000 mg/m2 | INTRAVENOUS | Status: DC
  Administered 2013-10-29: 4300 mg via INTRAVENOUS
  Filled 2013-10-29: qty 86

## 2013-10-29 MED ORDER — ONDANSETRON 8 MG PO TBDP
8.0000 mg | ORAL_TABLET | Freq: Once | ORAL | Status: AC
  Administered 2013-10-29: 8 mg via ORAL
  Filled 2013-10-29: qty 1

## 2013-10-29 MED ORDER — DEXAMETHASONE SODIUM PHOSPHATE 20 MG/5ML IJ SOLN
20.0000 mg | Freq: Once | INTRAMUSCULAR | Status: AC
  Administered 2013-10-29: 20 mg via INTRAVENOUS

## 2013-10-29 MED ORDER — HYOSCYAMINE SULFATE 0.125 MG PO TABS: 0.1250 mg | ORAL_TABLET | Freq: Four times a day (QID) | ORAL | Status: DC | PRN

## 2013-10-29 MED ORDER — IRINOTECAN HCL CHEMO INJECTION 100 MG/5ML
179.0000 mg/m2 | Freq: Once | INTRAVENOUS | Status: AC
  Administered 2013-10-29: 320 mg via INTRAVENOUS
  Filled 2013-10-29: qty 16

## 2013-10-29 MED ORDER — ONDANSETRON 16 MG/50ML IVPB (CHCC)
INTRAVENOUS | Status: AC
  Filled 2013-10-29: qty 16

## 2013-10-29 MED ORDER — ATROPINE SULFATE 1 MG/ML IJ SOLN
0.5000 mg | Freq: Once | INTRAMUSCULAR | Status: AC | PRN
  Administered 2013-10-29: 0.5 mg via INTRAVENOUS

## 2013-10-29 MED ORDER — DEXAMETHASONE SODIUM PHOSPHATE 20 MG/5ML IJ SOLN
INTRAMUSCULAR | Status: AC
  Filled 2013-10-29: qty 5

## 2013-10-29 MED ORDER — SODIUM CHLORIDE 0.9 % IV SOLN
Freq: Once | INTRAVENOUS | Status: AC
  Administered 2013-10-29: 11:00:00 via INTRAVENOUS

## 2013-10-29 MED ORDER — ONDANSETRON 16 MG/50ML IVPB (CHCC)
16.0000 mg | Freq: Once | INTRAVENOUS | Status: AC
  Administered 2013-10-29: 16 mg via INTRAVENOUS

## 2013-10-29 MED ORDER — ATROPINE SULFATE 1 MG/ML IJ SOLN
INTRAMUSCULAR | Status: AC
  Filled 2013-10-29: qty 1

## 2013-10-29 NOTE — Telephone Encounter (Signed)
Per staff message and POF I have scheduled appts.  JMW  

## 2013-10-29 NOTE — Progress Notes (Signed)
[facility] OFFICE PROGRESS NOTE  Thang Flett, MD [place] [address]  DIAGNOSIS: Colorectal cancer, stage IV - Plan: 0.9 %  sodium chloride infusion, CBC with Differential, Comprehensive metabolic panel (Cmet) - [facility], CEA, CANCELED: CBC with Differential, CANCELED: Comprehensive metabolic panel (Cmet) - [facility], CANCELED: CEA  Chief Complaint  Patient presents with  . Colorectal cancer, stage IV    CURRENT TREATMENT: Folfox q 14 weeks (07/09/2013 until 2/16).  Switched to [facility] due to mixed response (started on 09/13/2013).      Colorectal cancer, stage IV   05/22/2013 - 05/27/2013 Hospital Admission A/w abdominal distention and constipation for 2 days CT scan of her abdomen and pelvis showed area of bowel wall thickening at the rectosigmoid junction concerning for malignancy.   05/22/2013 Imaging CT of Abdomen: bowel wall thickening with shouldering of the proximal and distal margins, involving therectosigmoid junction, supicous liver spleen mets. Colonsocopy recommended.    05/23/2013 Tumor Marker CEA 225.7   05/24/2013 Imaging MRI of abdomen: 3.7 x 4.4 cm enhancing lesion along the superior spleen, indeterminate but worrisome for metastasis.2.7 x 3.9 cm enhancing lesion in the medial segment left hepatic lobe,    05/25/2013 Pathology Diagnosis Rectum, biopsy, proximal - INVASIVE ADENOCARCINOMA.   05/25/2013 Procedure Flex sig: (Dr. Ardis Hughs). 5-6 cm obstructing rectosigmoid mass distal end 10 cm from anal verge; mass biopsed and then stented 9 cm long, 22 cm diameter uncovered SEM.    05/25/2013 Initial Diagnosis Colorectal cancer, stage IV   06/25/2013 Procedure R port a cath placement.   07/09/2013 - 08/20/2013 Chemotherapy FOLFOX q 2 weeks started.  Not elgible for clinical trial and bevacimab based on stent and high risk for perforation.    07/30/2013 Tumor Marker KRAS positive.  p53, APC identified. Mismatch Repair (MMR) preserved.    08/06/2013 Adverse  Reaction Complains of darkening of her hands with peeling.    08/27/2013 Family History She was seen by Genetic couselor.  She declined testing today, but will call and reschedule an appointment should she decide to pursue testing.   09/07/2013 Imaging CT abdomen: Mixed response to therapy. Response to therapy of hepatic metastasis. right hepatic hemangiomas are again identified. Other too small to characterize liver lesions are felt to be similar but are indeterminate.. Enlargement of a splenic lesions   09/13/2013 Tumor Marker CEA 33.5   09/13/2013 Treatment Plan Change Reviewed scans consistent with mixed response.  Switch to FOLFIRI.  Provided script for immodium.    10/29/2013 Adverse Reaction Patient reports intense abdominal cramping lasting the first few days on chemotherapy.  Starting Hyoscyamine 0.125 mg q 6 hours prn.     INTERVAL HISTORY: [name] [name] 63 y.o. female with a history of Stage IV colon cancer is here for follow-up.  She was last seen by Carlton Adam on 10/15/2013.   Today, she reports her bowel movements are regular and that she is with minimal pain. She was constipated but is taking miralax regularly.  She now complains of intermittent abdominal cramping.  Lasted Monday and Tuesday of last week.   She relates it to the [facility].    She denies any fevers or chills. She also reports that her weight is stable with an adequate appetite. She is very active and handles her basic, intermediate and advanced ADLs without difficulties. She stated she had nausea and vomiting the first 2 days of each cycle.  She reports good energy and appetite.  She was seen by Dr. Dwyane Dee  of endocrinology.      MEDICAL HISTORY: Past Medical History  Diagnosis Date  . Colon cancer   . Anxiety     INTERIM HISTORY: has Rectal mass; Colon obstruction; Hypercalcemia; Colorectal cancer, stage IV; Family history of colorectal cancer; Thrush, oral; and Tachycardia on her problem list.    ALLERGIES:  is  allergic to codeine.  MEDICATIONS: has a current medication list which includes the following prescription(s): cyanocobalamin, lidocaine-prilocaine, loperamide, lorazepam, omeprazole, ondansetron, polyethylene glycol, tramadol, and hyoscyamine, and the following Facility-Administered Medications: sodium chloride, sodium chloride, fluorouracil (ADRUCIL) 4,300 mg in sodium chloride 0.9 % 150 mL chemo infusion, fluorouracil, irinotecan (CAMPTOSAR) 320 mg in dextrose 5 % 500 mL chemo infusion, and leucovorin 700 mg in dextrose 5 % 250 mL infusion.  SURGICAL HISTORY:  Past Surgical History  Procedure Laterality Date  . Abdominal hysterectomy  2005  . Cesarean section  1976  . Flexible sigmoidoscopy N/A 05/25/2013    Procedure: FLEXIBLE SIGMOIDOSCOPY;  Surgeon: [name], MD;  Location: [facility];  Service: Endoscopy;  Laterality: N/A;  needs floro  . Colonic stent placement N/A 05/25/2013    Procedure: COLONIC STENT PLACEMENT;  Surgeon: [name], MD;  Location: [facility];  Service: Endoscopy;  Laterality: N/A;  . Portacath placement Right 06/25/2013    Procedure: ULTRA SOUND GUIDED INSERTION PORT-A-CATH;  Surgeon: [name], MD;  Location: [facility];  Service: General;  Laterality: Right;    REVIEW OF SYSTEMS:   Constitutional: Denies fevers, chills or abnormal weight loss Eyes: Denies blurriness of vision Ears, nose, mouth, throat, and face: Denies mucositis or sore throat Respiratory: Denies cough, dyspnea or wheezes Cardiovascular: Denies palpitation, chest discomfort or lower extremity swelling Gastrointestinal:  Denies nausea, heartburn or change in bowel habits Skin: Denies abnormal skin rashes Lymphatics: Denies new lymphadenopathy or easy bruising Neurological:Denies numbness, tingling or new weaknesses Behavioral/Psych: Mood is stable, no new changes  All other systems were reviewed with the patient and are negative.  PHYSICAL  EXAMINATION: ECOG PERFORMANCE STATUS: 0 - Asymptomatic  Blood pressure 127/68, pulse 102, temperature 97.9 F (36.6 C), temperature source Oral, resp. rate 18, height '5\' 2"'  (1.575 m), weight 148 lb 14.4 oz (67.541 kg), SpO2 100.00%.  GENERAL:alert, no distress and comfortable; well-developed, well nourished.  SKIN: skin color, texture, turgor are normal, no rashes or significant lesions; + R Port a cath without tenderness  EYES: normal, Conjunctiva are pink and non-injected, sclera clear  OROPHARYNX:no exudate, no erythema and lips, buccal mucosa, and tongue with whitish strips  NECK: supple, thyroid normal size, non-tender, without nodularity  LYMPH: no palpable lymphadenopathy in the cervical, axillary or supraclavicular  LUNGS: clear to auscultation and percussion with normal breathing effort  HEART: Tachycardic with regular rhythm and no murmurs and no lower extremity edema  ABDOMEN:abdomen soft, non-tender and normal bowel sounds; + mild distention and mild TTP (deep) in the lower abdominal quandrant Musculoskeletal:no cyanosis of digits and no clubbing  NEURO: alert & oriented x 3 with fluent speech, no focal motor/sensory deficits  Labs:  Lab Results  Component Value Date   WBC 5.6 10/29/2013   HGB 10.3* 10/29/2013   HCT 31.9* 10/29/2013   MCV 85.1 10/29/2013   PLT 294 10/29/2013   NEUTROABS 3.5 10/29/2013      Chemistry      Component Value Date/Time   NA 139 10/29/2013 0934   NA 138 07/24/2013 1808   K 4.6 10/29/2013 0934   K 4.7 07/24/2013 1808  CL 101 07/24/2013 1808   CO2 24 10/29/2013 0934   CO2 23 07/24/2013 1808   BUN 13.3 10/29/2013 0934   BUN 20 07/24/2013 1808   CREATININE 0.9 10/29/2013 0934   CREATININE 0.76 07/24/2013 1808      Component Value Date/Time   CALCIUM 12.3* 10/29/2013 0934   CALCIUM 12.6* 07/24/2013 1808   ALKPHOS 64 10/29/2013 0934   ALKPHOS 89 05/22/2013 0930   AST 15 10/29/2013 0934   AST 26 05/22/2013 0930   ALT 10 10/29/2013 0934   ALT 15  05/22/2013 0930   BILITOT 0.22 10/29/2013 0934   BILITOT 0.2* 05/22/2013 0930       Basic Metabolic Panel:  Recent Labs Lab 10/29/13 0934  NA 139  K 4.6  CO2 24  GLUCOSE 96  BUN 13.3  CREATININE 0.9  CALCIUM 12.3*   GFR Estimated Creatinine Clearance: 59.2 ml/min (by C-G formula based on Cr of 0.9). Liver Function Tests:  Recent Labs Lab 10/29/13 0934  AST 15  ALT 10  ALKPHOS 64  BILITOT 0.22  PROT 7.8  ALBUMIN 3.2*    CBC:  Recent Labs Lab 10/29/13 0934  WBC 5.6  NEUTROABS 3.5  HGB 10.3*  HCT 31.9*  MCV 85.1  PLT 294   Results for LETTA, CARGILE (MRN [id number]) as of 10/29/2013 12:45  Ref. Range 07/23/2013 09:46 09/13/2013 08:30 10/15/2013 08:28  CEA Latest Range: 0.0-5.0 ng/mL 286.2 (H) 33.5 (H) 22.3 (H)   Studies:  No results found.   RADIOGRAPHIC STUDIES: None.  ASSESSMENT: Park Breed 63 y.o. female with a history of Colorectal cancer, stage IV - Plan: 0.9 %  sodium chloride infusion, CBC with Differential, Comprehensive metabolic panel (Cmet) - [facility], CEA, CANCELED: CBC with Differential, CANCELED: Comprehensive metabolic panel (Cmet) - [facility], CANCELED: CEA   ASSESSMENT: 63 yo AAF with family history of colon cancer, admitted with obstructive mass s/p stent and biopsy on 11/21 consistent with metastatic colon cancer with mets to liver, spleen. ECOG 0. Elevated CEA.  PLAN:  1. 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  -- Today, she reports feeling well overall.  We changed her chemotherapy to FOLFIRI.   There is no difference in overall response rate, time to progression and overall survival for patients treated with FOLFIRI versus FOLFOX4 in the treatment of advanced colorectal cancer. [facility], 2005, Aug 1; 23(22): 1829-93. For FOLFIRI and FOLFOX respectively, the median to to progression was 7 months versus 7 months; duration of response was 9 versus 10 months; overall survival was 14 versus 15  months and overall response rates were 31% versus 34%. Other potential therapies if progression is 5-FU/LV, CapeOx or capecitabine. We will continue FOLFORI on today 10/29/2013 q 14 day until progression or unacceptable toxicity.  It consists of the following:   Day # 1 Irinotecan 322 mg for 90 minutes  Leucovorin 400 mg/m2 for 2 hours before 5-FU d1  Fluorouracil 400 mg/m2 once starting 2.5 After treatment start time  Days #1/2 Fluorouracil 2,400 mg/m2 for 46 hours  Day #3 Pump D/C   The reference is from [place]. Al, Irinotecan combined with fluorouracil compared with fluororacil alone as first-line treatment for metastatic colorectal cancer: a multicenter randomised trial.  Lancet 2000 Apr 15; 355 (7169):  In addition, we previously provided an in depth discussion concerning the indications and side-effects of therapy. Side-effects includes but is not limited to bone marrow suppression (i.e., anemia, thrombocytopenia or leukopenia which can  lead to life threatening infections), nausea or vomiting, gastrointestinal effects including diarrhea and electrolyte imbalances, neurotoxicity mainly from oxaliplatinum, liver and/or kidney dysfunction. We will not provide neulasta unless she has febrile neutropenia as risk of febrile neutropenia is less than 5%. We will follow labs prior to chemotherapy. She understood the indications, benefits and risk of therapy and chose to proceed.  We will obtain a restaging CT of abdomen pelvis s/p 2 cycles of FOLFORI or 11/23/2013 .  2. Abdominal cramping. --We provided Hyoscyamine 0.125 mg q 6 hours prn.  Likely secondary to irinotecan.    3. Family history of Colon cancer.  --Based on her age and family history (sister deceased at age 71 from colon cancer), referral for genetic testing for Lynch syndrome, i.e. MSI or IHC, would be appropriate. We made a referral to genetics. Her tumor was negative for lynch syndrome. She was seen on 02/23 but declined  further testing for genetic syndromes presently.   4. 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  --Parathyroid hormone elevated 192.5 and parathyroid hormone-related peptide pending. PTH is generally low when hypercalcemia is secondary to malignancy. In primary PTH, it is elevated. We counseled continue aggressive fluid hydration. She was instructed to report to emergency room with worsening constipation or confusion. We referred to endocrinology for further evaluation. She was seen by Dr. Dwyane Dee.  We will give intermittent zometa.  She had dental-related pain and will be evaluated by her dentist prior to restarting.    5. Follow-up.  --Patient instructed to follow-up 2 weeks for labs, symptom visit, and consideration of next chemotherapy cycle.  All questions were answered. The patient knows to call the clinic with any problems, questions or concerns. We can certainly see the patient much sooner if necessary.  I spent 15 minutes counseling the patient face to face. The total time spent in the appointment was 25 minutes.    Concha Norway, MD 10/29/2013 12:50 PM

## 2013-10-29 NOTE — Progress Notes (Signed)
Patient reports she has a good appetite.  She was having constipation, but it has resolved.  Continues to take MiraLax.  She does complain of abdominal cramping.  Medication has been provided.  Patient has no nutrition issues at this time.  Weight is stable and was documented as 148.9 pounds on April 27.  Nutrition diagnosis: Food and nutrition related knowledge deficit improved.  Intervention: Patient educated to continue strategies for adequate oral intake with high protein foods as tolerated.  Educated patient to avoid spicy foods directly after chemotherapy to decrease nausea. Questions were answered.  Teach back method used.  Monitoring, evaluation, goals: Patient is tolerating adequate calories and protein for weight maintenance.  Next visit: Tuesday, May 26, during chemotherapy.

## 2013-10-29 NOTE — Telephone Encounter (Signed)
Gave pt appt for lab and MD , emailed Sharyn Lull regarding May chemo

## 2013-10-29 NOTE — Progress Notes (Signed)
1350 Patient c/o nausea, Per MD, v/o for zofran 8 mg odt.  1420 Patient states nausea has subsided and she was feeling better. Discharged ambulating with daughter.

## 2013-10-29 NOTE — Patient Instructions (Signed)
[facility] Discharge Instructions for Patients Receiving Chemotherapy  Today you received the following chemotherapy agents; Irinotecan, Leucovorin, Adrucil and Adrucil pump.  To help prevent nausea and vomiting after your treatment, we encourage you to take your nausea medication.   If you develop nausea and vomiting that is not controlled by your nausea medication, call the clinic.   BELOW ARE SYMPTOMS THAT SHOULD BE REPORTED IMMEDIATELY:  *FEVER GREATER THAN 100.5 F  *CHILLS WITH OR WITHOUT FEVER  NAUSEA AND VOMITING THAT IS NOT CONTROLLED WITH YOUR NAUSEA MEDICATION  *UNUSUAL SHORTNESS OF BREATH  *UNUSUAL BRUISING OR BLEEDING  TENDERNESS IN MOUTH AND THROAT WITH OR WITHOUT PRESENCE OF ULCERS  *URINARY PROBLEMS  *BOWEL PROBLEMS  UNUSUAL RASH Items with * indicate a potential emergency and should be followed up as soon as possible.  Feel free to call the clinic you have any questions or concerns. The clinic phone number is [phone].

## 2013-10-30 ENCOUNTER — Other Ambulatory Visit: Payer: Self-pay | Admitting: Medical Oncology

## 2013-10-31 ENCOUNTER — Telehealth: Payer: Self-pay | Admitting: Internal Medicine

## 2013-10-31 ENCOUNTER — Ambulatory Visit (HOSPITAL_BASED_OUTPATIENT_CLINIC_OR_DEPARTMENT_OTHER): Payer: Medicaid Other

## 2013-10-31 VITALS — BP 95/55 | HR 116 | Resp 16

## 2013-10-31 DIAGNOSIS — Z95828 Presence of other vascular implants and grafts: Secondary | ICD-10-CM

## 2013-10-31 DIAGNOSIS — C19 Malignant neoplasm of rectosigmoid junction: Secondary | ICD-10-CM

## 2013-10-31 DIAGNOSIS — C787 Secondary malignant neoplasm of liver and intrahepatic bile duct: Secondary | ICD-10-CM

## 2013-10-31 MED ORDER — SODIUM CHLORIDE 0.9 % IJ SOLN
10.0000 mL | INTRAMUSCULAR | Status: DC | PRN
  Administered 2013-10-31: 10 mL via INTRAVENOUS
  Filled 2013-10-31: qty 10

## 2013-10-31 MED ORDER — HEPARIN SOD (PORK) LOCK FLUSH 100 UNIT/ML IV SOLN
500.0000 [IU] | Freq: Once | INTRAVENOUS | Status: AC
  Administered 2013-10-31: 500 [IU] via INTRAVENOUS
  Filled 2013-10-31: qty 5

## 2013-10-31 NOTE — Telephone Encounter (Signed)
Called pt, left message regarding lab,md and chemo for May, appt mailed to pt

## 2013-10-31 NOTE — Progress Notes (Signed)
Pt came in for Pump start stop, Patient has no complaints about PAC. No swelling, redness or tenderness around area. Patient PAC flushes well. No blood return noted at this time. All chemo infusion complete via pump. Myrtle,RN notified. Informed to flush patient and deaccess at this time.

## 2013-11-12 ENCOUNTER — Other Ambulatory Visit: Payer: Self-pay | Admitting: Internal Medicine

## 2013-11-12 ENCOUNTER — Ambulatory Visit (HOSPITAL_BASED_OUTPATIENT_CLINIC_OR_DEPARTMENT_OTHER): Payer: Medicaid Other

## 2013-11-12 ENCOUNTER — Telehealth: Payer: Self-pay | Admitting: Internal Medicine

## 2013-11-12 ENCOUNTER — Ambulatory Visit (HOSPITAL_BASED_OUTPATIENT_CLINIC_OR_DEPARTMENT_OTHER): Payer: Medicaid Other | Admitting: Internal Medicine

## 2013-11-12 ENCOUNTER — Other Ambulatory Visit (HOSPITAL_BASED_OUTPATIENT_CLINIC_OR_DEPARTMENT_OTHER): Payer: Medicaid Other

## 2013-11-12 ENCOUNTER — Telehealth: Payer: Self-pay | Admitting: *Deleted

## 2013-11-12 VITALS — BP 130/64 | HR 101 | Temp 97.8°F | Resp 19 | Ht 62.0 in | Wt 148.5 lb

## 2013-11-12 DIAGNOSIS — C787 Secondary malignant neoplasm of liver and intrahepatic bile duct: Secondary | ICD-10-CM

## 2013-11-12 DIAGNOSIS — C50919 Malignant neoplasm of unspecified site of unspecified female breast: Secondary | ICD-10-CM

## 2013-11-12 DIAGNOSIS — C19 Malignant neoplasm of rectosigmoid junction: Secondary | ICD-10-CM

## 2013-11-12 DIAGNOSIS — C779 Secondary and unspecified malignant neoplasm of lymph node, unspecified: Secondary | ICD-10-CM

## 2013-11-12 DIAGNOSIS — Z800 Family history of malignant neoplasm of digestive organs: Secondary | ICD-10-CM

## 2013-11-12 DIAGNOSIS — K6289 Other specified diseases of anus and rectum: Secondary | ICD-10-CM

## 2013-11-12 DIAGNOSIS — Z5111 Encounter for antineoplastic chemotherapy: Secondary | ICD-10-CM

## 2013-11-12 LAB — CBC WITH DIFFERENTIAL/PLATELET
BASO%: 0.2 % (ref 0.0–2.0)
Basophils Absolute: 0 10*3/uL (ref 0.0–0.1)
EOS ABS: 0.2 10*3/uL (ref 0.0–0.5)
EOS%: 3.5 % (ref 0.0–7.0)
HCT: 31.5 % — ABNORMAL LOW (ref 34.8–46.6)
HGB: 10.2 g/dL — ABNORMAL LOW (ref 11.6–15.9)
LYMPH%: 21.1 % (ref 14.0–49.7)
MCH: 27.7 pg (ref 25.1–34.0)
MCHC: 32.3 g/dL (ref 31.5–36.0)
MCV: 85.6 fL (ref 79.5–101.0)
MONO#: 0.5 10*3/uL (ref 0.1–0.9)
MONO%: 8.9 % (ref 0.0–14.0)
NEUT%: 66.3 % (ref 38.4–76.8)
NEUTROS ABS: 3.5 10*3/uL (ref 1.5–6.5)
Platelets: 320 10*3/uL (ref 145–400)
RBC: 3.69 10*6/uL — ABNORMAL LOW (ref 3.70–5.45)
RDW: 16.4 % — ABNORMAL HIGH (ref 11.2–14.5)
WBC: 5.2 10*3/uL (ref 3.9–10.3)
lymph#: 1.1 10*3/uL (ref 0.9–3.3)

## 2013-11-12 LAB — COMPREHENSIVE METABOLIC PANEL (CC13)
ALT: 7 U/L (ref 0–55)
AST: 13 U/L (ref 5–34)
Albumin: 3.1 g/dL — ABNORMAL LOW (ref 3.5–5.0)
Alkaline Phosphatase: 63 U/L (ref 40–150)
Anion Gap: 7 mEq/L (ref 3–11)
BUN: 12.1 mg/dL (ref 7.0–26.0)
CO2: 24 meq/L (ref 22–29)
CREATININE: 0.8 mg/dL (ref 0.6–1.1)
Calcium: 12.1 mg/dL — ABNORMAL HIGH (ref 8.4–10.4)
Chloride: 108 mEq/L (ref 98–109)
GLUCOSE: 103 mg/dL (ref 70–140)
Potassium: 4.5 mEq/L (ref 3.5–5.1)
Sodium: 139 mEq/L (ref 136–145)
Total Protein: 7.7 g/dL (ref 6.4–8.3)

## 2013-11-12 LAB — CEA: CEA: 13.6 ng/mL — AB (ref 0.0–5.0)

## 2013-11-12 MED ORDER — LORAZEPAM 0.5 MG PO TABS: ORAL_TABLET | ORAL | Status: DC

## 2013-11-12 MED ORDER — ATROPINE SULFATE 1 MG/ML IJ SOLN
INTRAMUSCULAR | Status: AC
  Filled 2013-11-12: qty 1

## 2013-11-12 MED ORDER — ONDANSETRON 16 MG/50ML IVPB (CHCC)
16.0000 mg | Freq: Once | INTRAVENOUS | Status: AC
  Administered 2013-11-12: 16 mg via INTRAVENOUS

## 2013-11-12 MED ORDER — SODIUM CHLORIDE 0.9 % IV SOLN
2400.0000 mg/m2 | INTRAVENOUS | Status: DC
  Administered 2013-11-12: 4300 mg via INTRAVENOUS
  Filled 2013-11-12: qty 86

## 2013-11-12 MED ORDER — LEUCOVORIN CALCIUM INJECTION 350 MG
400.0000 mg/m2 | Freq: Once | INTRAVENOUS | Status: AC
  Administered 2013-11-12: 700 mg via INTRAVENOUS
  Filled 2013-11-12: qty 35

## 2013-11-12 MED ORDER — FLUOROURACIL CHEMO INJECTION 2.5 GM/50ML
400.0000 mg/m2 | Freq: Once | INTRAVENOUS | Status: AC
  Administered 2013-11-12: 700 mg via INTRAVENOUS
  Filled 2013-11-12: qty 14

## 2013-11-12 MED ORDER — SODIUM CHLORIDE 0.9 % IV SOLN
Freq: Once | INTRAVENOUS | Status: AC
  Administered 2013-11-12: 10:00:00 via INTRAVENOUS

## 2013-11-12 MED ORDER — ONDANSETRON 16 MG/50ML IVPB (CHCC)
INTRAVENOUS | Status: AC
  Filled 2013-11-12: qty 16

## 2013-11-12 MED ORDER — DEXAMETHASONE SODIUM PHOSPHATE 20 MG/5ML IJ SOLN
20.0000 mg | Freq: Once | INTRAMUSCULAR | Status: AC
  Administered 2013-11-12: 20 mg via INTRAVENOUS

## 2013-11-12 MED ORDER — DEXAMETHASONE SODIUM PHOSPHATE 20 MG/5ML IJ SOLN
INTRAMUSCULAR | Status: AC
  Filled 2013-11-12: qty 5

## 2013-11-12 MED ORDER — ATROPINE SULFATE 1 MG/ML IJ SOLN
0.5000 mg | Freq: Once | INTRAMUSCULAR | Status: AC | PRN
  Administered 2013-11-12: 0.5 mg via INTRAVENOUS

## 2013-11-12 MED ORDER — DEXTROSE 5 % IV SOLN
180.0000 mg/m2 | Freq: Once | INTRAVENOUS | Status: AC
  Administered 2013-11-12: 320 mg via INTRAVENOUS
  Filled 2013-11-12: qty 16

## 2013-11-12 NOTE — Progress Notes (Signed)
[facility] OFFICE PROGRESS NOTE  [name] Carrasco, MD [place] [address]  DIAGNOSIS: Colorectal cancer, stage IV - Plan: CBC with Differential, Comprehensive metabolic panel (Cmet) - [facility], CBC with Differential, Comprehensive metabolic panel (Cmet) - [facility], CEA, CT Abdomen Pelvis W Wo Contrast, CT Chest W Contrast, LORazepam (ATIVAN) 0.5 MG tablet, DISCONTINUED: LORazepam (ATIVAN) 0.5 MG tablet  Rectal mass - Plan: LORazepam (ATIVAN) 0.5 MG tablet, DISCONTINUED: LORazepam (ATIVAN) 0.5 MG tablet  Chief Complaint  Patient presents with  . Follow-up    CURRENT TREATMENT: Folfox q 14 weeks (07/09/2013 until 2/16).  Switched to [facility] q 14 days due to mixed response (started on 09/13/2013).      Colorectal cancer, stage IV   05/22/2013 - 05/27/2013 Hospital Admission A/w abdominal distention and constipation for 2 days CT scan of her abdomen and pelvis showed area of bowel wall thickening at the rectosigmoid junction concerning for malignancy.   05/22/2013 Imaging CT of Abdomen: bowel wall thickening with shouldering of the proximal and distal margins, involving therectosigmoid junction, supicous liver spleen mets. Colonsocopy recommended.    05/23/2013 Tumor Marker CEA 225.7   05/24/2013 Imaging MRI of abdomen: 3.7 x 4.4 cm enhancing lesion along the superior spleen, indeterminate but worrisome for metastasis.2.7 x 3.9 cm enhancing lesion in the medial segment left hepatic lobe,    05/25/2013 Pathology Diagnosis Rectum, biopsy, proximal - INVASIVE ADENOCARCINOMA.   05/25/2013 Procedure Flex sig: (Dr. Ardis Hughs). 5-6 cm obstructing rectosigmoid mass distal end 10 cm from anal verge; mass biopsed and then stented 9 cm long, 22 cm diameter uncovered SEM.    05/25/2013 Initial Diagnosis Colorectal cancer, stage IV   06/25/2013 Procedure R port a cath placement.   07/09/2013 - 08/20/2013 Chemotherapy FOLFOX q 2 weeks started.  Not elgible for clinical trial and bevacimab based  on stent and high risk for perforation.    07/30/2013 Tumor Marker KRAS positive.  p53, APC identified. Mismatch Repair (MMR) preserved.    08/06/2013 Adverse Reaction Complains of darkening of her hands with peeling.    08/27/2013 Family History She was seen by Genetic couselor.  She declined testing today, but will call and reschedule an appointment should she decide to pursue testing.   09/07/2013 Imaging CT abdomen: Mixed response to therapy. Response to therapy of hepatic metastasis. right hepatic hemangiomas are again identified. Other too small to characterize liver lesions are felt to be similar but are indeterminate.. Enlargement of a splenic lesions   09/13/2013 Tumor Marker CEA 33.5   09/13/2013 Treatment Plan Change Reviewed scans consistent with mixed response.  Switch to FOLFIRI.  Provided script for immodium.    10/29/2013 Adverse Reaction Patient reports intense abdominal cramping lasting the first few days on chemotherapy.  Starting Hyoscyamine 0.125 mg q 6 hours prn.     INTERVAL HISTORY: [name] [name] 63 y.o. female with a history of Stage IV colon cancer is here for follow-up.  She was last seen by me on 10/29/2013   Today, she reports her bowel movements are regular and that she is with minimal pain. She is having intermittent diarrhea.   She complains of intermittent abdominal cramping for the first 2 days but has improved on hyoscyamine. She denies any fevers or chills. She also reports that her weight is stable with an adequate appetite. She is very active and handles her basic, intermediate and advanced ADLs without difficulties. She stated she had nausea and vomiting the first 2 days of each cycle.  She reports good energy and appetite.    MEDICAL HISTORY: Past Medical History  Diagnosis Date  . Colon cancer   . Anxiety     INTERIM HISTORY: has Rectal mass; Colon obstruction; Hypercalcemia; Colorectal cancer, stage IV; Family history of colorectal cancer; Thrush, oral; and  Tachycardia on her problem list.    ALLERGIES:  is allergic to codeine.  MEDICATIONS: has a current medication list which includes the following prescription(s): cyanocobalamin, hyoscyamine, lidocaine-prilocaine, loperamide, lorazepam, omeprazole, ondansetron, polyethylene glycol, and tramadol, and the following Facility-Administered Medications: sodium chloride.  SURGICAL HISTORY:  Past Surgical History  Procedure Laterality Date  . Abdominal hysterectomy  2005  . Cesarean section  1976  . Flexible sigmoidoscopy N/A 05/25/2013    Procedure: FLEXIBLE SIGMOIDOSCOPY;  Surgeon: [name], MD;  Location: [facility];  Service: Endoscopy;  Laterality: N/A;  needs floro  . Colonic stent placement N/A 05/25/2013    Procedure: COLONIC STENT PLACEMENT;  Surgeon: [name], MD;  Location: [facility];  Service: Endoscopy;  Laterality: N/A;  . Portacath placement Right 06/25/2013    Procedure: ULTRA SOUND GUIDED INSERTION PORT-A-CATH;  Surgeon: [name], MD;  Location: [facility];  Service: General;  Laterality: Right;    REVIEW OF SYSTEMS:   Constitutional: Denies fevers, chills or abnormal weight loss Eyes: Denies blurriness of vision Ears, nose, mouth, throat, and face: Denies mucositis or sore throat Respiratory: Denies cough, dyspnea or wheezes Cardiovascular: Denies palpitation, chest discomfort or lower extremity swelling Gastrointestinal:  Denies nausea, heartburn or change in bowel habits Skin: Denies abnormal skin rashes Lymphatics: Denies new lymphadenopathy or easy bruising Neurological:Denies numbness, tingling or new weaknesses Behavioral/Psych: Mood is stable, no new changes  All other systems were reviewed with the patient and are negative.  PHYSICAL EXAMINATION: ECOG PERFORMANCE STATUS: 0 - Asymptomatic  Blood pressure 130/64, pulse 101, temperature 97.8 F (36.6 C), temperature source Oral, resp. rate 19, height '5\' 2"'  (1.575 m), weight  148 lb 8 oz (67.359 kg), SpO2 98.00%.  GENERAL:alert, no distress and comfortable; well-developed, well nourished.  SKIN: skin color, texture, turgor are normal, no rashes or significant lesions; + R Port a cath without tenderness  EYES: normal, Conjunctiva are pink and non-injected, sclera clear  OROPHARYNX:no exudate, no erythema and lips, buccal mucosa, and tongue with whitish strips  NECK: supple, thyroid normal size, non-tender, without nodularity  LYMPH: no palpable lymphadenopathy in the cervical, axillary or supraclavicular  LUNGS: clear to auscultation and percussion with normal breathing effort  HEART: Tachycardic with regular rhythm and no murmurs and no lower extremity edema  ABDOMEN:abdomen soft, non-tender and normal bowel sounds; mild TTP (deep) in the lower abdominal quandrant Musculoskeletal:no cyanosis of digits and no clubbing  NEURO: alert & oriented x 3 with fluent speech, no focal motor/sensory deficits  Labs:  Lab Results  Component Value Date   WBC 5.2 11/12/2013   HGB 10.2* 11/12/2013   HCT 31.5* 11/12/2013   MCV 85.6 11/12/2013   PLT 320 11/12/2013   NEUTROABS 3.5 11/12/2013      Chemistry      Component Value Date/Time   NA 139 11/12/2013 0816   NA 138 07/24/2013 1808   K 4.5 11/12/2013 0816   K 4.7 07/24/2013 1808   CL 101 07/24/2013 1808   CO2 24 11/12/2013 0816   CO2 23 07/24/2013 1808   BUN 12.1 11/12/2013 0816   BUN 20 07/24/2013 1808   CREATININE 0.8 11/12/2013 0816   CREATININE 0.76 07/24/2013 1808  Component Value Date/Time   CALCIUM 12.1* 11/12/2013 0816   CALCIUM 12.6* 07/24/2013 1808   ALKPHOS 63 11/12/2013 0816   ALKPHOS 89 05/22/2013 0930   AST 13 11/12/2013 0816   AST 26 05/22/2013 0930   ALT 7 11/12/2013 0816   ALT 15 05/22/2013 0930   BILITOT <0.20 11/12/2013 0816   BILITOT 0.2* 05/22/2013 0930       Basic Metabolic Panel:  Recent Labs Lab 11/12/13 0816  NA 139  K 4.5  CO2 24  GLUCOSE 103  BUN 12.1  CREATININE 0.8  CALCIUM  12.1*   GFR Estimated Creatinine Clearance: 66.5 ml/min (by C-G formula based on Cr of 0.8). Liver Function Tests:  Recent Labs Lab 11/12/13 0816  AST 13  ALT 7  ALKPHOS 63  BILITOT <0.20  PROT 7.7  ALBUMIN 3.1*    CBC:  Recent Labs Lab 11/12/13 0816  WBC 5.2  NEUTROABS 3.5  HGB 10.2*  HCT 31.5*  MCV 85.6  PLT 320   Results for ERIONNA, STRUM (MRN [id number]) as of 10/29/2013 12:45  Ref. Range 07/23/2013 09:46 09/13/2013 08:30 10/15/2013 08:28  CEA Latest Range: 0.0-5.0 ng/mL 286.2 (H) 33.5 (H) 22.3 (H)   Studies:  No results found.   RADIOGRAPHIC STUDIES: None.  ASSESSMENT: Park Breed 63 y.o. female with a history of Colorectal cancer, stage IV - Plan: CBC with Differential, Comprehensive metabolic panel (Cmet) - [facility], CBC with Differential, Comprehensive metabolic panel (Cmet) - [facility], CEA, CT Abdomen Pelvis W Wo Contrast, CT Chest W Contrast, LORazepam (ATIVAN) 0.5 MG tablet, DISCONTINUED: LORazepam (ATIVAN) 0.5 MG tablet  Rectal mass - Plan: LORazepam (ATIVAN) 0.5 MG tablet, DISCONTINUED: LORazepam (ATIVAN) 0.5 MG tablet   ASSESSMENT: 63 yo AAF with family history of colon cancer, admitted with obstructive mass s/p stent and biopsy on 11/21 consistent with metastatic colon cancer with mets to liver, spleen. ECOG 0. Elevated CEA.  PLAN:  1. 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  -- Today, she reports feeling well overall.  We changed her chemotherapy to FOLFIRI.   There is no difference in overall response rate, time to progression and overall survival for patients treated with FOLFIRI versus FOLFOX4 in the treatment of advanced colorectal cancer. [facility], 2005, Aug 1; 23(22): 7622-63. For FOLFIRI and FOLFOX respectively, the median to to progression was 7 months versus 7 months; duration of response was 9 versus 10 months; overall survival was 14 versus 15 months and overall response rates were 31% versus 34%.  Other potential therapies if progression is 5-FU/LV, CapeOx or capecitabine. We will continue FOLFORI on today 11/12/2013 q 14 day until progression or unacceptable toxicity.  It consists of the following:   Day # 1 Irinotecan 322 mg for 90 minutes  Leucovorin 400 mg/m2 for 2 hours before 5-FU d1  Fluorouracil 400 mg/m2 once starting 2.5 After treatment start time  Days #1/2 Fluorouracil 2,400 mg/m2 for 46 hours  Day #3 Pump D/C   The reference is from [place]. Al, Irinotecan combined with fluorouracil compared with fluororacil alone as first-line treatment for metastatic colorectal cancer: a multicenter randomised trial.  Lancet 2000 Apr 15; 355 (3354):  In addition, we previously provided an in depth discussion concerning the indications and side-effects of therapy. Side-effects includes but is not limited to bone marrow suppression (i.e., anemia, thrombocytopenia or leukopenia which can lead to life threatening infections), nausea or vomiting, gastrointestinal effects including diarrhea and electrolyte imbalances, neurotoxicity mainly from  oxaliplatinum, liver and/or kidney dysfunction. We will not provide neulasta unless she has febrile neutropenia as risk of febrile neutropenia is less than 5%. We will follow labs prior to chemotherapy. She understood the indications, benefits and risk of therapy and chose to proceed.  We will obtain a restaging CT of abdomen pelvis s/p 2 cycles of FOLFORI or 11/23/2013 .  2. Abdominal cramping. --Last visit, we provided Hyoscyamine 0.125 mg q 6 hours prn.  Likely secondary to irinotecan. She notes some improvement with this medication.    3. Family history of Colon cancer.  --Based on her age and family history (sister deceased at age 57 from colon cancer), referral for genetic testing for Lynch syndrome, i.e. MSI or IHC, would be appropriate. We made a referral to genetics. Her tumor was negative for lynch syndrome. She was seen on 02/23 but declined  further testing for genetic syndromes presently.   4. 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  --Parathyroid hormone elevated 192.5 and parathyroid hormone-related peptide pending. PTH is generally low when hypercalcemia is secondary to malignancy. In primary PTH, it is elevated. We counseled continue aggressive fluid hydration. She was instructed to report to emergency room with worsening constipation or confusion. We referred to endocrinology for further evaluation. She was seen by Dr. Dwyane Dee.  We will give intermittent zometa.  She had dental-related pain and will be evaluated by her dentist prior to restarting.    5. Follow-up.  --Patient instructed to follow-up 4 weeks for labs, symptom visit, and consideration of next chemotherapy cycle. We will follow CEA monthly.   All questions were answered. The patient knows to call the clinic with any problems, questions or concerns. We can certainly see the patient much sooner if necessary.  I spent 15 minutes counseling the patient face to face. The total time spent in the appointment was 25 minutes.    Concha Norway, MD 11/12/2013 9:12 AM

## 2013-11-12 NOTE — Patient Instructions (Signed)
[facility] Discharge Instructions for Patients Receiving Chemotherapy  Today you received the following chemotherapy agents Camptosar, leucovorin, 5FU.  To help prevent nausea and vomiting after your treatment, we encourage you to take your nausea medication ativan 0.5 and zofran 8 mg as ordered by Dr. Juliann Mule.   If you develop nausea and vomiting that is not controlled by your nausea medication, call the clinic.   BELOW ARE SYMPTOMS THAT SHOULD BE REPORTED IMMEDIATELY:  *FEVER GREATER THAN 100.5 F  *CHILLS WITH OR WITHOUT FEVER  NAUSEA AND VOMITING THAT IS NOT CONTROLLED WITH YOUR NAUSEA MEDICATION  *UNUSUAL SHORTNESS OF BREATH  *UNUSUAL BRUISING OR BLEEDING  TENDERNESS IN MOUTH AND THROAT WITH OR WITHOUT PRESENCE OF ULCERS  *URINARY PROBLEMS  *BOWEL PROBLEMS  UNUSUAL RASH Items with * indicate a potential emergency and should be followed up as soon as possible.  Feel free to call the clinic you have any questions or concerns. The clinic phone number is [phone].

## 2013-11-12 NOTE — Progress Notes (Signed)
Discharged at 48 with cousin, ambulatory in no distress

## 2013-11-12 NOTE — Telephone Encounter (Signed)
Per staff message and POF I have scheduled appts.  JMW  

## 2013-11-12 NOTE — Telephone Encounter (Signed)
Gave pt appt for lab,md and chemo for May and emailed Sharyn Lull for June chemo

## 2013-11-14 ENCOUNTER — Ambulatory Visit (HOSPITAL_BASED_OUTPATIENT_CLINIC_OR_DEPARTMENT_OTHER): Payer: Medicaid Other

## 2013-11-14 VITALS — BP 98/58 | HR 68 | Temp 97.0°F

## 2013-11-14 DIAGNOSIS — C19 Malignant neoplasm of rectosigmoid junction: Secondary | ICD-10-CM

## 2013-11-14 DIAGNOSIS — Z452 Encounter for adjustment and management of vascular access device: Secondary | ICD-10-CM

## 2013-11-14 MED ORDER — SODIUM CHLORIDE 0.9 % IJ SOLN
10.0000 mL | INTRAMUSCULAR | Status: DC | PRN
  Administered 2013-11-14: 10 mL
  Filled 2013-11-14: qty 10

## 2013-11-14 MED ORDER — HEPARIN SOD (PORK) LOCK FLUSH 100 UNIT/ML IV SOLN
500.0000 [IU] | Freq: Once | INTRAVENOUS | Status: AC | PRN
  Administered 2013-11-14: 500 [IU]
  Filled 2013-11-14: qty 5

## 2013-11-15 ENCOUNTER — Emergency Department (HOSPITAL_COMMUNITY): Payer: Medicaid Other

## 2013-11-15 ENCOUNTER — Encounter (HOSPITAL_COMMUNITY): Payer: Self-pay | Admitting: Emergency Medicine

## 2013-11-15 ENCOUNTER — Emergency Department (HOSPITAL_COMMUNITY)
Admission: EM | Admit: 2013-11-15 | Discharge: 2013-11-15 | Disposition: A | Discharge status: Home / Self Care | Payer: Medicaid Other | Attending: Emergency Medicine | Admitting: Emergency Medicine

## 2013-11-15 DIAGNOSIS — IMO0002 Reserved for concepts with insufficient information to code with codable children: Secondary | ICD-10-CM | POA: Insufficient documentation

## 2013-11-15 DIAGNOSIS — Y9289 Other specified places as the place of occurrence of the external cause: Secondary | ICD-10-CM | POA: Insufficient documentation

## 2013-11-15 DIAGNOSIS — F411 Generalized anxiety disorder: Secondary | ICD-10-CM | POA: Insufficient documentation

## 2013-11-15 DIAGNOSIS — Z87891 Personal history of nicotine dependence: Secondary | ICD-10-CM | POA: Insufficient documentation

## 2013-11-15 DIAGNOSIS — C19 Malignant neoplasm of rectosigmoid junction: Secondary | ICD-10-CM

## 2013-11-15 DIAGNOSIS — T185XXA Foreign body in anus and rectum, initial encounter: Secondary | ICD-10-CM | POA: Insufficient documentation

## 2013-11-15 DIAGNOSIS — Y9389 Activity, other specified: Secondary | ICD-10-CM | POA: Insufficient documentation

## 2013-11-15 DIAGNOSIS — Z9889 Other specified postprocedural states: Secondary | ICD-10-CM | POA: Insufficient documentation

## 2013-11-15 DIAGNOSIS — Z79899 Other long term (current) drug therapy: Secondary | ICD-10-CM | POA: Insufficient documentation

## 2013-11-15 DIAGNOSIS — T85528A Displacement of other gastrointestinal prosthetic devices, implants and grafts, initial encounter: Secondary | ICD-10-CM

## 2013-11-15 DIAGNOSIS — C189 Malignant neoplasm of colon, unspecified: Secondary | ICD-10-CM | POA: Insufficient documentation

## 2013-11-15 LAB — BASIC METABOLIC PANEL
BUN: 19 mg/dL (ref 6–23)
CO2: 24 mEq/L (ref 19–32)
Calcium: 12.7 mg/dL — ABNORMAL HIGH (ref 8.4–10.5)
Chloride: 99 mEq/L (ref 96–112)
Creatinine, Ser: 0.91 mg/dL (ref 0.50–1.10)
GFR calc Af Amer: 77 mL/min — ABNORMAL LOW (ref >90)
GFR, EST NON AFRICAN AMERICAN: 67 mL/min — AB (ref >90)
GLUCOSE: 114 mg/dL — AB (ref 70–99)
Potassium: 4.3 mEq/L (ref 3.7–5.3)
Sodium: 137 mEq/L (ref 137–147)

## 2013-11-15 LAB — CBC WITH DIFFERENTIAL/PLATELET
BASOS PCT: 0 % (ref 0–1)
Basophils Absolute: 0 10*3/uL (ref 0.0–0.1)
EOS PCT: 0 % (ref 0–5)
Eosinophils Absolute: 0 10*3/uL (ref 0.0–0.7)
HCT: 34 % — ABNORMAL LOW (ref 36.0–46.0)
Hemoglobin: 11.5 g/dL — ABNORMAL LOW (ref 12.0–15.0)
LYMPHS ABS: 1.3 10*3/uL (ref 0.7–4.0)
Lymphocytes Relative: 27 % (ref 12–46)
MCH: 28.2 pg (ref 26.0–34.0)
MCHC: 33.8 g/dL (ref 30.0–36.0)
MCV: 83.3 fL (ref 78.0–100.0)
Monocytes Absolute: 0.5 10*3/uL (ref 0.1–1.0)
Monocytes Relative: 10 % (ref 3–12)
Neutro Abs: 3 10*3/uL (ref 1.7–7.7)
Neutrophils Relative %: 64 % (ref 43–77)
PLATELETS: 338 10*3/uL (ref 150–400)
RBC: 4.08 MIL/uL (ref 3.87–5.11)
RDW: 14.9 % (ref 11.5–15.5)
WBC: 4.7 10*3/uL (ref 4.0–10.5)

## 2013-11-15 MED ORDER — MORPHINE SULFATE 4 MG/ML IJ SOLN
6.0000 mg | Freq: Once | INTRAMUSCULAR | Status: AC
  Administered 2013-11-15: 6 mg via INTRAVENOUS
  Filled 2013-11-15: qty 2

## 2013-11-15 NOTE — Consult Note (Signed)
[facility] Referring Provider: No ref. provider found Primary Care Physician:  Concha Norway, MD Primary Gastroenterologist:  Dr. Ardis Hughs  Reason for Consultation: Stent protruding from anus   HPI:  [name] [name] is a 63 y.o. 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  She did very well after stent placement, has really had no issues with her bowels until last night when having a usual BM she had some anal discomfort and then noted stent protruding from her bottom. There has been no bleeding. She came to the ER for help.  No fevers or chills, no abd pains.   Past Medical History  Diagnosis Date  . Colon cancer   . Anxiety     Past Surgical History  Procedure Laterality Date  . Abdominal hysterectomy  2005  . Cesarean section  1976  . Flexible sigmoidoscopy N/A 05/25/2013    Procedure: FLEXIBLE SIGMOIDOSCOPY;  Surgeon: [name], MD;  Location: [facility];  Service: Endoscopy;  Laterality: N/A;  needs floro  . Colonic stent placement N/A 05/25/2013    Procedure: COLONIC STENT PLACEMENT;  Surgeon: [name], MD;  Location: [facility];  Service: Endoscopy;  Laterality: N/A;  . Portacath placement Right 06/25/2013    Procedure: ULTRA SOUND GUIDED INSERTION PORT-A-CATH;  Surgeon: [name], MD;  Location: [facility];  Service: General;  Laterality: Right;    Prior to Admission medications   Medication Sig Start Date End Date Taking? Authorizing Provider  hyoscyamine (LEVSIN, ANASPAZ) 0.125 MG tablet Take 1 tablet (0.125 mg total) by mouth every 6 (six) hours as needed. 10/29/13  Yes Concha Norway, MD  loperamide (IMODIUM A-D) 2 MG tablet Take 2 at onset of diarrhea,  then 1 every 2hrs until 12hr without a BM. May take 2 tab every 4hrs at bedtime. If diarrhea recurs repeat. 09/13/13  Yes Concha Norway, MD  LORazepam (ATIVAN) 0.5 MG tablet Take 0.5 mg by mouth every 6 (six) hours as needed (nausea/vomiting).   Yes Historical Provider, MD  ondansetron (ZOFRAN) 8 MG tablet Take 1 tablet (8mg ) by mouth 2 times a day starting the day after chemo for 2 days. Then take 1 tablet (8mg ) twice a day as needed for nausea or vomiting. 10/25/13  Yes Concha Norway, MD  polyethylene glycol (MIRALAX / GLYCOLAX) packet Take 34 g by mouth daily as needed for mild constipation or moderate constipation. Is using prn 10/29/13  Yes Concha Norway, MD  lidocaine-prilocaine (EMLA) cream Apply 1 application topically as needed. Apply to port site one hour before treatment and cover with plastic wrap. 07/02/13   Concha Norway, MD    No current facility-administered medications for this encounter.   Current Outpatient Prescriptions  Medication Sig Dispense Refill  . hyoscyamine (LEVSIN, ANASPAZ) 0.125 MG tablet Take 1 tablet (0.125 mg total) by mouth every 6 (six) hours as needed.  30 tablet  1  . loperamide (IMODIUM A-D) 2 MG tablet Take 2 at onset of diarrhea, then 1 every 2hrs until 12hr without a BM. May take 2 tab every 4hrs at bedtime. If diarrhea recurs repeat.  100 tablet  1  . LORazepam (ATIVAN) 0.5 MG tablet Take 0.5 mg by mouth every 6 (six) hours as needed (nausea/vomiting).      Marland Kitchen  ondansetron (ZOFRAN) 8 MG tablet Take 1 tablet (8mg ) by mouth 2 times a day starting the day after chemo for 2 days. Then take 1 tablet (8mg ) twice a day as needed for nausea or vomiting.  30 tablet  1  . polyethylene glycol (MIRALAX / GLYCOLAX) packet Take 34 g by mouth daily as needed for mild constipation or moderate constipation. Is using prn  30 each  0  . lidocaine-prilocaine (EMLA) cream Apply 1 application topically as needed. Apply to port site one hour before treatment and cover with plastic wrap.  30 g   3   Facility-Administered Medications Ordered in Other Encounters  Medication Dose Route Frequency Provider Last Rate Last Dose  . 0.9 %  sodium chloride infusion  1,000 mL Intravenous Continuous Concha Norway, MD   1,000 mL at 10/17/13 1250    Allergies as of 11/15/2013 - Review Complete 11/15/2013  Allergen Reaction Noted  . Codeine Itching and Nausea And Vomiting 05/22/2013    Family History  Problem Relation Age of Onset  . Colon cancer Sister 47  . Diabetes Neg Hx   . Thyroid disease Neg Hx   . Breast cancer Maternal Aunt   . Prostate cancer Maternal Uncle   . Bone cancer Maternal Grandfather   . Prostate cancer Maternal Uncle     History   Social History  . Marital Status: Married    Spouse Name: N/A    Number of Children: 3  . Years of Education: N/A   Occupational History  .     Social History Main Topics  . Smoking status: Former Smoker -- 1 years    Quit date: 05/24/1971  . Smokeless tobacco: Never Used  . Alcohol Use: No  . Drug Use: No  . Sexual Activity: Not on file   Other Topics Concern  . Not on file   Social History Narrative  . No narrative on file     Review of Systems: Pertinent positive and negative review of systems were noted in the above HPI section. Complete review of systems was performed and was otherwise normal.   Physical Exam: Vital signs in last 24 hours: Temp:  [97 F (36.1 C)-97.6 F (36.4 C)] 97.6 F (36.4 C) (05/14 0422) Pulse Rate:  [68-117] 95 (05/14 0655) Resp:  [16-18] 18 (05/14 0655) BP: (98-107)/(58-62) 107/62 mmHg (05/14 0655) SpO2:  [97 %-100 %] 97 % (05/14 0655)   Constitutional: generally well-appearing Psychiatric: alert and oriented x3 Eyes: extraocular movements intact Mouth: oral pharynx moist, no lesions Neck: supple no lymphadenopathy Cardiovascular: heart regular rate and rhythm Lungs: clear to auscultation bilaterally Abdomen: soft, nontender, nondistended, no obvious ascites, no peritoneal  signs, normal bowel sounds Extremities: no lower extremity edema bilaterally Skin: no lesions on visible extremities Rectal exam: metal mesh stent protruding from anus by about 4-5cm with adherent stool. There was no blood present.  I gently pulled the stent from her anus without signficant pain, resistence or bleeding.  The stent was intact after removal and there was no blood present. She felt well.     Lab Results:  Recent Labs  11/12/13 0816 11/15/13 0615  WBC 5.2 4.7  HGB 10.2* 11.5*  HCT 31.5* 34.0*  PLT 320 338  MCV 85.6 83.3   BMET  Recent Labs  11/12/13 0816 11/15/13 0615  NA 139 137  K 4.5 4.3  CL  --  99  CO2 24 24  GLUCOSE 103 114*  BUN 12.1 19  CREATININE 0.8 0.91  CALCIUM 12.1* 12.7*   LFT  Recent Labs  11/12/13 0816  BILITOT <0.20  AST 13  ALT 7  ALKPHOS 38  PROT 7.7  ALBUMIN 3.1*   Impression/Plan: 63 y.o. female with migrated rectosigmoid colon stent placed 05/2013 by me for metastatic, obstructing colon cancer  The stent was extending about 4cm out of the anus and I gently removed it with traction in ER. I've asked that KUB plain films be performed and if no obvious free air or perirectal air then she will be safe to go home.    [name], MD  11/15/2013, 8:07 AM [facility] Pager [phone]

## 2013-11-15 NOTE — ED Provider Notes (Signed)
CSN: [id number]     Arrival date & time 11/15/13  0418 History   First MD Initiated Contact with Patient 11/15/13 0541     Chief Complaint  Patient presents with  . Rectal Pain      HPI Patient presents with rectal pain and complains that her rectal stents is coming out of her bottom.  She had a colonic stent placed in November 2014 for colon cancer.  She's currently undergoing chemotherapy.  She's been in her normal state of health.  She reports that she had a bowel movement today and her rectal stents came out.  She states it causes pain with movement.  No bleeding.  No abdominal pain.  No other complaints   Past Medical History  Diagnosis Date  . Colon cancer   . Anxiety    Past Surgical History  Procedure Laterality Date  . Abdominal hysterectomy  2005  . Cesarean section  1976  . Flexible sigmoidoscopy N/A 05/25/2013    Procedure: FLEXIBLE SIGMOIDOSCOPY;  Surgeon: [name], MD;  Location: [facility];  Service: Endoscopy;  Laterality: N/A;  needs floro  . Colonic stent placement N/A 05/25/2013    Procedure: COLONIC STENT PLACEMENT;  Surgeon: [name], MD;  Location: [facility];  Service: Endoscopy;  Laterality: N/A;  . Portacath placement Right 06/25/2013    Procedure: ULTRA SOUND GUIDED INSERTION PORT-A-CATH;  Surgeon: [name], MD;  Location: [place];  Service: General;  Laterality: Right;   Family History  Problem Relation Age of Onset  . Colon cancer Sister 103  . Diabetes Neg Hx   . Thyroid disease Neg Hx   . Breast cancer Maternal Aunt   . Prostate cancer Maternal Uncle   . Bone cancer Maternal Grandfather   . Prostate cancer Maternal Uncle    History  Substance Use Topics  . Smoking status: Former Smoker -- 1 years    Quit date: 05/24/1971  . Smokeless tobacco: Never Used  . Alcohol Use: No   OB History   Grav Para Term Preterm Abortions TAB SAB Ect Mult Living                 Review of Systems  All other  systems reviewed and are negative.     Allergies  Codeine  Home Medications   Prior to Admission medications   Medication Sig Start Date End Date Taking? Authorizing Provider  hyoscyamine (LEVSIN, ANASPAZ) 0.125 MG tablet Take 1 tablet (0.125 mg total) by mouth every 6 (six) hours as needed. 10/29/13  Yes Concha Norway, MD  loperamide (IMODIUM A-D) 2 MG tablet Take 2 at onset of diarrhea, then 1 every 2hrs until 12hr without a BM. May take 2 tab every 4hrs at bedtime. If diarrhea recurs repeat. 09/13/13  Yes Concha Norway, MD  LORazepam (ATIVAN) 0.5 MG tablet Take 0.5 mg by mouth every 6 (six) hours as needed (nausea/vomiting).   Yes Historical Provider, MD  ondansetron (ZOFRAN) 8 MG tablet Take 1 tablet (8mg ) by mouth 2 times a day starting the day after chemo for 2 days. Then take 1 tablet (8mg ) twice a day as needed for nausea or vomiting. 10/25/13  Yes Concha Norway, MD  polyethylene glycol (MIRALAX / GLYCOLAX) packet Take 34 g by mouth daily as needed for mild constipation or moderate constipation. Is using prn 10/29/13  Yes Concha Norway, MD  lidocaine-prilocaine (EMLA) cream Apply 1 application topically as needed. Apply to port site one hour before treatment and  cover with plastic wrap. 07/02/13   Concha Norway, MD   BP 99/60  Pulse 117  Temp(Src) 97.6 F (36.4 C) (Oral)  Resp 16  SpO2 100% Physical Exam  Nursing note and vitals reviewed. Constitutional: She is oriented to person, place, and time. She appears well-developed and well-nourished.  HENT:  Head: Normocephalic.  Eyes: EOM are normal.  Neck: Normal range of motion.  Pulmonary/Chest: Effort normal.  Abdominal: She exhibits no distension. There is no tenderness.  Genitourinary:  Colonic stent appears to be a coming out of her rectum.  Its out approximately 2 cm.  No active bleeding  Musculoskeletal: Normal range of motion.  Neurological: She is alert and oriented to person, place, and time.  Psychiatric: She has a normal mood  and affect.    ED Course  Procedures (including critical care time) Labs Review Labs Reviewed - No data to display  Imaging Review No results found.   EKG Interpretation None      MDM   Final diagnoses:  None    I spoke with gastroenterology Dr. Maurene Capes who either will value with the patient emergency department or one of her partners will evaluate her.  N.p.o.  May require evaluation in the endoscopy suite.  Baseline labs ordered.  Patient updated    Hoy Morn, MD 11/15/13 [phone]

## 2013-11-15 NOTE — ED Notes (Signed)
Pt states she has colon cancer and had a stent placed in her intestines in November  Pt states tonight she feels the stent has moved and she is having rectal pain   Pt states her last chemo was on Monday

## 2013-11-18 ENCOUNTER — Telehealth: Payer: Self-pay | Admitting: Internal Medicine

## 2013-11-18 NOTE — Telephone Encounter (Signed)
Called pt and left message regarding appt this week, advised her to get appt calendar for May and June 2015

## 2013-11-19 ENCOUNTER — Telehealth: Payer: Self-pay

## 2013-11-19 NOTE — Telephone Encounter (Signed)
Pt called stating her insurance company needs more information to authorize her CT scan on Friday. Information forwarded to [employer].

## 2013-11-20 ENCOUNTER — Ambulatory Visit (INDEPENDENT_AMBULATORY_CARE_PROVIDER_SITE_OTHER): Payer: Medicaid Other | Admitting: Gastroenterology

## 2013-11-20 ENCOUNTER — Encounter: Payer: Self-pay | Admitting: Gastroenterology

## 2013-11-20 VITALS — BP 128/78 | HR 112 | Ht 64.5 in | Wt 148.0 lb

## 2013-11-20 DIAGNOSIS — C189 Malignant neoplasm of colon, unspecified: Secondary | ICD-10-CM

## 2013-11-20 NOTE — Patient Instructions (Signed)
Call if any new troubles. Will forward this note to Dr. Juliann Mule.

## 2013-11-20 NOTE — Progress Notes (Signed)
Review of pertinent gastrointestinal problems: 1. Metastatic colon cancer, diagnosed November 2014, 2 liver and spleen. She is under the care of Dr.  Juliann Mule. Diagnosed during colonoscopy Dr. Ardis Hughs 11 2014, biopsy proven adenocarcinoma. She had an obstructing distal sigmoid mass which I stented after extensive discussions with surgery. This was a metal mesh stent and then she underwent chemotherapy. The stent migrated, protruding about halfway out of her anus may 2015 and I gently removed the stent with simple traction without any bleeding or significant discomfort.    HPI: This is a very pleasant 63 year old woman whom I last saw last week in the emergency room when her stent had migrated about halfway out of her anus. I removed it gently with traction. Since removal she is and she felt a bit better. She has less abdominal cramping. She has 4-5 loose stools daily, these are "more complete" and her bowel movements were before the stent migrated out of position.   No abdominal pains, she was having some cramps in lower abdomen in past 2 weeks or so.  She has 4-5 loose stools per day.  She was having a lot of loose stools even with the stent (also bloating).  She has not had any radiation, only chemotherapy.  No rectal bleeding.   Past Medical History  Diagnosis Date  . Colon cancer   . Anxiety     Past Surgical History  Procedure Laterality Date  . Abdominal hysterectomy  2005  . Cesarean section  1976  . Flexible sigmoidoscopy N/A 05/25/2013    Procedure: FLEXIBLE SIGMOIDOSCOPY;  Surgeon: [name], MD;  Location: [facility];  Service: Endoscopy;  Laterality: N/A;  needs floro  . Colonic stent placement N/A 05/25/2013    Procedure: COLONIC STENT PLACEMENT;  Surgeon: [name], MD;  Location: [facility];  Service: Endoscopy;  Laterality: N/A;  . Portacath placement Right 06/25/2013    Procedure: ULTRA SOUND GUIDED INSERTION PORT-A-CATH;  Surgeon: [name], MD;   Location: [place];  Service: General;  Laterality: Right;    Current Outpatient Prescriptions  Medication Sig Dispense Refill  . hyoscyamine (LEVSIN, ANASPAZ) 0.125 MG tablet Take 1 tablet (0.125 mg total) by mouth every 6 (six) hours as needed.  30 tablet  1  . lidocaine-prilocaine (EMLA) cream Apply 1 application topically as needed. Apply to port site one hour before treatment and cover with plastic wrap.  30 g  3  . loperamide (IMODIUM A-D) 2 MG tablet Take 2 at onset of diarrhea, then 1 every 2hrs until 12hr without a BM. May take 2 tab every 4hrs at bedtime. If diarrhea recurs repeat.  100 tablet  1  . LORazepam (ATIVAN) 0.5 MG tablet Take 0.5 mg by mouth every 6 (six) hours as needed (nausea/vomiting).      . ondansetron (ZOFRAN) 8 MG tablet Take 1 tablet (8mg ) by mouth 2 times a day starting the day after chemo for 2 days. Then take 1 tablet (8mg ) twice a day as needed for nausea or vomiting.  30 tablet  1  . polyethylene glycol (MIRALAX / GLYCOLAX) packet Take 34 g by mouth daily as needed for mild constipation or moderate constipation. Is using prn  30 each  0   No current facility-administered medications for this visit.   Facility-Administered Medications Ordered in Other Visits  Medication Dose Route Frequency Provider Last Rate Last Dose  . 0.9 %  sodium chloride infusion  1,000 mL Intravenous Continuous Concha Norway, MD  1,000 mL at 10/17/13 1250    Allergies as of 11/20/2013 - Review Complete 11/20/2013  Allergen Reaction Noted  . Codeine Itching and Nausea And Vomiting 05/22/2013    Family History  Problem Relation Age of Onset  . Colon cancer Sister 79  . Diabetes Neg Hx   . Thyroid disease Neg Hx   . Breast cancer Maternal Aunt   . Prostate cancer Maternal Uncle   . Bone cancer Maternal Grandfather   . Prostate cancer Maternal Uncle     History   Social History  . Marital Status: Married    Spouse Name: N/A    Number of Children: 3  .  Years of Education: N/A   Occupational History  .     Social History Main Topics  . Smoking status: Former Smoker -- 1 years    Types: Cigarettes    Quit date: 05/24/1971  . Smokeless tobacco: Never Used  . Alcohol Use: No  . Drug Use: No  . Sexual Activity: Not on file   Other Topics Concern  . Not on file   Social History Narrative  . No narrative on file      Physical Exam: BP 128/78  Pulse 112  Ht 5' 4.5" (1.638 m)  Wt 148 lb (67.132 kg)  BMI 25.02 kg/m2 Constitutional: generally well-appearing Psychiatric: alert and oriented x3 Abdomen: soft, nontender, nondistended, no obvious ascites, no peritoneal signs, normal bowel sounds     Assessment and plan: 63 y.o. female with metastatic colon cancer, distal sigmoid, rectosigmoid primary  One concern I have given her 4-5 loose stools per day is that she is having overflow, obstructive diarrhea. She actually presented with a colonic obstruction initially 6 or 8 months ago. The primary is still in site, I am sure at some point she'll begin to obstruct again. Perhaps it would be worthwhile to initiate local therapy with radiation. I suspect if we are to do this we would have to define the area of narrowing a bit better, perhaps with flexible sigmoidoscopy, perhaps radiologically. I will send a copy of this note to her oncologist Dr. Juliann Mule as well as radiation oncologist Dr. Kyung Rudd for their opinions and I will present her case at the next GI cancer conference.

## 2013-11-21 ENCOUNTER — Telehealth: Payer: Self-pay | Admitting: Medical Oncology

## 2013-11-21 NOTE — Telephone Encounter (Signed)
Pt called to verify her CT for 11/23/13. She has been informed that her insurance company was not going to approve. I spoke with Benedetto Goad and she has been approved for chest CT and Dr. Juliann Mule is ok with this.He had order CT chest, abdomen and pelvis.

## 2013-11-22 ENCOUNTER — Other Ambulatory Visit: Payer: Self-pay | Admitting: Internal Medicine

## 2013-11-22 DIAGNOSIS — C19 Malignant neoplasm of rectosigmoid junction: Secondary | ICD-10-CM

## 2013-11-23 ENCOUNTER — Other Ambulatory Visit: Payer: Medicaid Other

## 2013-11-23 ENCOUNTER — Ambulatory Visit (HOSPITAL_COMMUNITY)
Admission: RE | Admit: 2013-11-23 | Discharge: 2013-11-23 | Disposition: A | Discharge status: Home / Self Care | Payer: Medicaid Other | Source: Ambulatory Visit | Attending: Internal Medicine | Admitting: Internal Medicine

## 2013-11-23 ENCOUNTER — Encounter (HOSPITAL_COMMUNITY): Payer: Self-pay

## 2013-11-23 ENCOUNTER — Telehealth: Payer: Self-pay | Admitting: Internal Medicine

## 2013-11-23 DIAGNOSIS — C19 Malignant neoplasm of rectosigmoid junction: Secondary | ICD-10-CM | POA: Insufficient documentation

## 2013-11-23 MED ORDER — IOHEXOL 300 MG/ML  SOLN
80.0000 mL | Freq: Once | INTRAMUSCULAR | Status: AC | PRN
  Administered 2013-11-23: 80 mL via INTRAVENOUS

## 2013-11-23 NOTE — Telephone Encounter (Signed)
, °

## 2013-11-27 ENCOUNTER — Ambulatory Visit (HOSPITAL_BASED_OUTPATIENT_CLINIC_OR_DEPARTMENT_OTHER): Payer: Medicaid Other

## 2013-11-27 ENCOUNTER — Ambulatory Visit: Payer: Medicaid Other | Admitting: Nutrition

## 2013-11-27 ENCOUNTER — Other Ambulatory Visit: Payer: Self-pay | Admitting: Internal Medicine

## 2013-11-27 ENCOUNTER — Telehealth: Payer: Self-pay

## 2013-11-27 ENCOUNTER — Other Ambulatory Visit: Payer: Self-pay

## 2013-11-27 VITALS — BP 125/69 | HR 85 | Temp 98.5°F

## 2013-11-27 DIAGNOSIS — C50919 Malignant neoplasm of unspecified site of unspecified female breast: Secondary | ICD-10-CM

## 2013-11-27 DIAGNOSIS — C19 Malignant neoplasm of rectosigmoid junction: Secondary | ICD-10-CM

## 2013-11-27 DIAGNOSIS — C779 Secondary and unspecified malignant neoplasm of lymph node, unspecified: Secondary | ICD-10-CM

## 2013-11-27 DIAGNOSIS — C787 Secondary malignant neoplasm of liver and intrahepatic bile duct: Secondary | ICD-10-CM

## 2013-11-27 DIAGNOSIS — Z5111 Encounter for antineoplastic chemotherapy: Secondary | ICD-10-CM

## 2013-11-27 LAB — CBC WITH DIFFERENTIAL/PLATELET
BASO%: 0.2 % (ref 0.0–2.0)
Basophils Absolute: 0 10*3/uL (ref 0.0–0.1)
EOS%: 3.9 % (ref 0.0–7.0)
Eosinophils Absolute: 0.2 10*3/uL (ref 0.0–0.5)
HCT: 33.4 % — ABNORMAL LOW (ref 34.8–46.6)
HGB: 10.9 g/dL — ABNORMAL LOW (ref 11.6–15.9)
LYMPH%: 26.5 % (ref 14.0–49.7)
MCH: 28.2 pg (ref 25.1–34.0)
MCHC: 32.6 g/dL (ref 31.5–36.0)
MCV: 86.5 fL (ref 79.5–101.0)
MONO#: 0.3 10*3/uL (ref 0.1–0.9)
MONO%: 6.9 % (ref 0.0–14.0)
NEUT%: 62.5 % (ref 38.4–76.8)
NEUTROS ABS: 2.9 10*3/uL (ref 1.5–6.5)
Platelets: 229 10*3/uL (ref 145–400)
RBC: 3.86 10*6/uL (ref 3.70–5.45)
RDW: 16.5 % — AB (ref 11.2–14.5)
WBC: 4.7 10*3/uL (ref 3.9–10.3)
lymph#: 1.2 10*3/uL (ref 0.9–3.3)

## 2013-11-27 LAB — COMPREHENSIVE METABOLIC PANEL (CC13)
ALBUMIN: 3.3 g/dL — AB (ref 3.5–5.0)
ALK PHOS: 67 U/L (ref 40–150)
ALT: 13 U/L (ref 0–55)
AST: 16 U/L (ref 5–34)
Anion Gap: 9 mEq/L (ref 3–11)
BUN: 12 mg/dL (ref 7.0–26.0)
CO2: 21 mEq/L — ABNORMAL LOW (ref 22–29)
Calcium: 11.9 mg/dL — ABNORMAL HIGH (ref 8.4–10.4)
Chloride: 107 mEq/L (ref 98–109)
Creatinine: 0.8 mg/dL (ref 0.6–1.1)
Glucose: 97 mg/dl (ref 70–140)
POTASSIUM: 4 meq/L (ref 3.5–5.1)
SODIUM: 137 meq/L (ref 136–145)
TOTAL PROTEIN: 7.9 g/dL (ref 6.4–8.3)
Total Bilirubin: 0.2 mg/dL (ref 0.20–1.20)

## 2013-11-27 MED ORDER — DEXAMETHASONE SODIUM PHOSPHATE 20 MG/5ML IJ SOLN
20.0000 mg | Freq: Once | INTRAMUSCULAR | Status: AC
  Administered 2013-11-27: 20 mg via INTRAVENOUS

## 2013-11-27 MED ORDER — LEUCOVORIN CALCIUM INJECTION 350 MG
400.0000 mg/m2 | Freq: Once | INTRAVENOUS | Status: AC
  Administered 2013-11-27: 700 mg via INTRAVENOUS
  Filled 2013-11-27: qty 35

## 2013-11-27 MED ORDER — SODIUM CHLORIDE 0.9 % IV SOLN
Freq: Once | INTRAVENOUS | Status: AC
  Administered 2013-11-27: 13:00:00 via INTRAVENOUS

## 2013-11-27 MED ORDER — DEXAMETHASONE SODIUM PHOSPHATE 20 MG/5ML IJ SOLN
INTRAMUSCULAR | Status: AC
  Filled 2013-11-27: qty 5

## 2013-11-27 MED ORDER — SODIUM CHLORIDE 0.9 % IV SOLN
2400.0000 mg/m2 | INTRAVENOUS | Status: DC
  Administered 2013-11-27: 4300 mg via INTRAVENOUS
  Filled 2013-11-27: qty 86

## 2013-11-27 MED ORDER — ONDANSETRON 16 MG/50ML IVPB (CHCC)
16.0000 mg | Freq: Once | INTRAVENOUS | Status: AC
  Administered 2013-11-27: 16 mg via INTRAVENOUS

## 2013-11-27 MED ORDER — FLUOROURACIL CHEMO INJECTION 2.5 GM/50ML
400.0000 mg/m2 | Freq: Once | INTRAVENOUS | Status: AC
  Administered 2013-11-27: 700 mg via INTRAVENOUS
  Filled 2013-11-27: qty 14

## 2013-11-27 MED ORDER — ATROPINE SULFATE 1 MG/ML IJ SOLN
INTRAMUSCULAR | Status: AC
  Filled 2013-11-27: qty 1

## 2013-11-27 MED ORDER — ONDANSETRON 16 MG/50ML IVPB (CHCC)
INTRAVENOUS | Status: AC
  Filled 2013-11-27: qty 16

## 2013-11-27 MED ORDER — IRINOTECAN HCL CHEMO INJECTION 100 MG/5ML
180.0000 mg/m2 | Freq: Once | INTRAVENOUS | Status: AC
  Administered 2013-11-27: 320 mg via INTRAVENOUS
  Filled 2013-11-27: qty 16

## 2013-11-27 MED ORDER — ATROPINE SULFATE 1 MG/ML IJ SOLN
0.5000 mg | Freq: Once | INTRAMUSCULAR | Status: AC | PRN
  Administered 2013-11-27: 0.5 mg via INTRAVENOUS

## 2013-11-27 NOTE — Progress Notes (Signed)
Nutrition followup completed with patient receiving chemotherapy for metastatic colon cancer.  Patient's weight remained stable at 148 pounds, May 19.  Patient reports abdominal cramps have now resolved.  Patient states constipation is resolved.  No nutrition concerns.  Nutrition diagnosis: Food and nutrition related knowledge deficit resolved.  Patient encouraged to continue small frequent meals and snacks for weight maintenance.  Patient to contact me with further questions or concerns.

## 2013-11-27 NOTE — Telephone Encounter (Signed)
Answering call, pt needs labs today before chemo, come in at 1230 for labs. POF and call to scheduler.

## 2013-11-27 NOTE — Patient Instructions (Signed)
Cancer Center Discharge Instructions for Patients Receiving Chemotherapy  Today you received the following chemotherapy agents Irinotecan/Leucovorin/5FU.  To help prevent nausea and vomiting after your treatment, we encourage you to take your nausea medication as prescribed.  If you develop nausea and vomiting that is not controlled by your nausea medication, call the clinic.   BELOW ARE SYMPTOMS THAT SHOULD BE REPORTED IMMEDIATELY:  *FEVER GREATER THAN 100.5 F  *CHILLS WITH OR WITHOUT FEVER  NAUSEA AND VOMITING THAT IS NOT CONTROLLED WITH YOUR NAUSEA MEDICATION  *UNUSUAL SHORTNESS OF BREATH  *UNUSUAL BRUISING OR BLEEDING  TENDERNESS IN MOUTH AND THROAT WITH OR WITHOUT PRESENCE OF ULCERS  *URINARY PROBLEMS  *BOWEL PROBLEMS  UNUSUAL RASH Items with * indicate a potential emergency and should be followed up as soon as possible.  Feel free to call the clinic you have any questions or concerns. The clinic phone number is [phone].    

## 2013-11-29 ENCOUNTER — Ambulatory Visit (HOSPITAL_BASED_OUTPATIENT_CLINIC_OR_DEPARTMENT_OTHER): Payer: Medicaid Other

## 2013-11-29 DIAGNOSIS — C19 Malignant neoplasm of rectosigmoid junction: Secondary | ICD-10-CM

## 2013-11-29 DIAGNOSIS — Z452 Encounter for adjustment and management of vascular access device: Secondary | ICD-10-CM

## 2013-11-29 DIAGNOSIS — Z95828 Presence of other vascular implants and grafts: Secondary | ICD-10-CM

## 2013-11-29 MED ORDER — SODIUM CHLORIDE 0.9 % IJ SOLN
10.0000 mL | INTRAMUSCULAR | Status: DC | PRN
  Administered 2013-11-29: 10 mL via INTRAVENOUS
  Filled 2013-11-29: qty 10

## 2013-11-29 MED ORDER — HEPARIN SOD (PORK) LOCK FLUSH 100 UNIT/ML IV SOLN
500.0000 [IU] | Freq: Once | INTRAVENOUS | Status: AC
Start: 2013-11-29 — End: 2013-11-29
  Administered 2013-11-29: 500 [IU] via INTRAVENOUS
  Filled 2013-11-29: qty 5

## 2013-11-29 NOTE — Patient Instructions (Signed)

## 2013-12-10 ENCOUNTER — Telehealth: Payer: Self-pay | Admitting: Internal Medicine

## 2013-12-10 ENCOUNTER — Other Ambulatory Visit: Payer: Self-pay | Admitting: Internal Medicine

## 2013-12-10 ENCOUNTER — Telehealth: Payer: Self-pay | Admitting: *Deleted

## 2013-12-10 ENCOUNTER — Ambulatory Visit (HOSPITAL_BASED_OUTPATIENT_CLINIC_OR_DEPARTMENT_OTHER): Payer: Medicaid Other

## 2013-12-10 ENCOUNTER — Encounter: Payer: Self-pay | Admitting: Internal Medicine

## 2013-12-10 ENCOUNTER — Other Ambulatory Visit (HOSPITAL_BASED_OUTPATIENT_CLINIC_OR_DEPARTMENT_OTHER): Payer: Medicaid Other

## 2013-12-10 ENCOUNTER — Ambulatory Visit (HOSPITAL_BASED_OUTPATIENT_CLINIC_OR_DEPARTMENT_OTHER): Payer: Medicaid Other | Admitting: Internal Medicine

## 2013-12-10 VITALS — BP 133/61 | HR 80 | Temp 98.7°F | Resp 18 | Ht 64.5 in | Wt 151.5 lb

## 2013-12-10 DIAGNOSIS — C19 Malignant neoplasm of rectosigmoid junction: Secondary | ICD-10-CM

## 2013-12-10 DIAGNOSIS — C7889 Secondary malignant neoplasm of other digestive organs: Secondary | ICD-10-CM

## 2013-12-10 DIAGNOSIS — T85528A Displacement of other gastrointestinal prosthetic devices, implants and grafts, initial encounter: Secondary | ICD-10-CM

## 2013-12-10 DIAGNOSIS — K6289 Other specified diseases of anus and rectum: Secondary | ICD-10-CM

## 2013-12-10 DIAGNOSIS — C787 Secondary malignant neoplasm of liver and intrahepatic bile duct: Secondary | ICD-10-CM

## 2013-12-10 DIAGNOSIS — Z5111 Encounter for antineoplastic chemotherapy: Secondary | ICD-10-CM

## 2013-12-10 DIAGNOSIS — Z800 Family history of malignant neoplasm of digestive organs: Secondary | ICD-10-CM

## 2013-12-10 LAB — CBC WITH DIFFERENTIAL/PLATELET
BASO%: 0.5 % (ref 0.0–2.0)
Basophils Absolute: 0 10e3/uL (ref 0.0–0.1)
EOS%: 2.7 % (ref 0.0–7.0)
Eosinophils Absolute: 0.1 10e3/uL (ref 0.0–0.5)
HCT: 33.8 % — ABNORMAL LOW (ref 34.8–46.6)
HGB: 10.8 g/dL — ABNORMAL LOW (ref 11.6–15.9)
LYMPH%: 26.3 % (ref 14.0–49.7)
MCH: 27.9 pg (ref 25.1–34.0)
MCHC: 32 g/dL (ref 31.5–36.0)
MCV: 87.2 fL (ref 79.5–101.0)
MONO#: 0.5 10e3/uL (ref 0.1–0.9)
MONO%: 10.5 % (ref 0.0–14.0)
NEUT#: 3 10e3/uL (ref 1.5–6.5)
NEUT%: 60 % (ref 38.4–76.8)
Platelets: 287 10e3/uL (ref 145–400)
RBC: 3.87 10e6/uL (ref 3.70–5.45)
RDW: 17.7 % — ABNORMAL HIGH (ref 11.2–14.5)
WBC: 5 10e3/uL (ref 3.9–10.3)
lymph#: 1.3 10e3/uL (ref 0.9–3.3)

## 2013-12-10 LAB — COMPREHENSIVE METABOLIC PANEL (CC13)
ALT: 11 U/L (ref 0–55)
AST: 14 U/L (ref 5–34)
Albumin: 3.2 g/dL — ABNORMAL LOW (ref 3.5–5.0)
Alkaline Phosphatase: 59 U/L (ref 40–150)
Anion Gap: 6 meq/L (ref 3–11)
BUN: 13.7 mg/dL (ref 7.0–26.0)
CO2: 24 meq/L (ref 22–29)
Calcium: 12.2 mg/dL — ABNORMAL HIGH (ref 8.4–10.4)
Chloride: 108 meq/L (ref 98–109)
Creatinine: 0.8 mg/dL (ref 0.6–1.1)
Glucose: 92 mg/dL (ref 70–140)
Potassium: 5 meq/L (ref 3.5–5.1)
Sodium: 139 meq/L (ref 136–145)
Total Bilirubin: <0.2 mg/dL (ref 0.20–1.20)
Total Protein: 7.6 g/dL (ref 6.4–8.3)

## 2013-12-10 MED ORDER — LEUCOVORIN CALCIUM INJECTION 350 MG
400.0000 mg/m2 | Freq: Once | INTRAVENOUS | Status: AC
  Administered 2013-12-10: 700 mg via INTRAVENOUS
  Filled 2013-12-10: qty 35

## 2013-12-10 MED ORDER — DEXAMETHASONE SODIUM PHOSPHATE 20 MG/5ML IJ SOLN
20.0000 mg | Freq: Once | INTRAMUSCULAR | Status: AC
  Administered 2013-12-10: 20 mg via INTRAVENOUS

## 2013-12-10 MED ORDER — IRINOTECAN HCL CHEMO INJECTION 100 MG/5ML
180.0000 mg/m2 | Freq: Once | INTRAVENOUS | Status: AC
  Administered 2013-12-10: 320 mg via INTRAVENOUS
  Filled 2013-12-10: qty 16

## 2013-12-10 MED ORDER — FLUOROURACIL CHEMO INJECTION 2.5 GM/50ML
400.0000 mg/m2 | Freq: Once | INTRAVENOUS | Status: AC
  Administered 2013-12-10: 700 mg via INTRAVENOUS
  Filled 2013-12-10: qty 14

## 2013-12-10 MED ORDER — ALTEPLASE 2 MG IJ SOLR
2.0000 mg | Freq: Once | INTRAMUSCULAR | Status: AC | PRN
  Administered 2013-12-10: 2 mg
  Filled 2013-12-10: qty 2

## 2013-12-10 MED ORDER — ATROPINE SULFATE 1 MG/ML IJ SOLN
0.5000 mg | Freq: Once | INTRAMUSCULAR | Status: AC | PRN
  Administered 2013-12-10: 0.5 mg via INTRAVENOUS

## 2013-12-10 MED ORDER — SODIUM CHLORIDE 0.9 % IV SOLN
2400.0000 mg/m2 | INTRAVENOUS | Status: DC
  Administered 2013-12-10: 4300 mg via INTRAVENOUS
  Filled 2013-12-10: qty 86

## 2013-12-10 MED ORDER — ONDANSETRON 16 MG/50ML IVPB (CHCC)
INTRAVENOUS | Status: AC
Start: 2013-12-10 — End: 2013-12-10
  Filled 2013-12-10: qty 16

## 2013-12-10 MED ORDER — SODIUM CHLORIDE 0.9 % IV SOLN
Freq: Once | INTRAVENOUS | Status: AC
  Administered 2013-12-10: 14:00:00 via INTRAVENOUS

## 2013-12-10 MED ORDER — ATROPINE SULFATE 1 MG/ML IJ SOLN
INTRAMUSCULAR | Status: AC
  Filled 2013-12-10: qty 1

## 2013-12-10 MED ORDER — ONDANSETRON 16 MG/50ML IVPB (CHCC)
16.0000 mg | Freq: Once | INTRAVENOUS | Status: AC
  Administered 2013-12-10: 16 mg via INTRAVENOUS

## 2013-12-10 MED ORDER — DEXAMETHASONE SODIUM PHOSPHATE 20 MG/5ML IJ SOLN
INTRAMUSCULAR | Status: AC
  Filled 2013-12-10: qty 5

## 2013-12-10 NOTE — Telephone Encounter (Signed)
Per staff message and POF I have scheduled appts.  JMW  

## 2013-12-10 NOTE — Progress Notes (Signed)
1425: Able to withdraw 10 ml of blood after cathflo instilled at 12:50.  Port flushes easily.

## 2013-12-10 NOTE — Telephone Encounter (Signed)
[facility] dn printed appt sched and avs for pt fro June...MW added tx...Marland KitchenMarland KitchenPt sched to see Dr. Zella Richer on 6.30 @ 2:30pm

## 2013-12-10 NOTE — Progress Notes (Signed)
[facility] OFFICE PROGRESS NOTE  [name] Eugene, MD [place] [address]  DIAGNOSIS: Colorectal cancer, stage IV - Plan: CBC with Differential, Comprehensive metabolic panel (Cmet) - [facility], Lactate dehydrogenase (LDH) - [facility], Ambulatory referral to General Surgery  Family history of colorectal cancer  Migrated colon stent - Plan: Ambulatory referral to General Surgery  Rectal mass  Chief Complaint  Patient presents with  . Colorectal cancer, stage IV    CURRENT TREATMENT: Folfox q 14 weeks (07/09/2013 until 08/20/2013).  Switched to [facility] q 14 days due to mixed response (started on 09/13/2013).      Colorectal cancer, stage IV   05/22/2013 - 05/27/2013 Hospital Admission A/w abdominal distention and constipation for 2 days CT scan of her abdomen and pelvis showed area of bowel wall thickening at the rectosigmoid junction concerning for malignancy.   05/22/2013 Imaging CT of Abdomen: bowel wall thickening with shouldering of the proximal and distal margins, involving therectosigmoid junction, supicous liver spleen mets. Colonsocopy recommended.    05/23/2013 Tumor Marker CEA 225.7   05/24/2013 Imaging MRI of abdomen: 3.7 x 4.4 cm enhancing lesion along the superior spleen, indeterminate but worrisome for metastasis.2.7 x 3.9 cm enhancing lesion in the medial segment left hepatic lobe,    05/25/2013 Pathology Diagnosis Rectum, biopsy, proximal - INVASIVE ADENOCARCINOMA.   05/25/2013 Procedure Flex sig: (Dr. Ardis Hughs). 5-6 cm obstructing rectosigmoid mass distal end 10 cm from anal verge; mass biopsed and then stented 9 cm long, 22 cm diameter uncovered SEM.    05/25/2013 Initial Diagnosis Colorectal cancer, stage IV   06/25/2013 Procedure R port a cath placement.   07/09/2013 - 08/20/2013 Chemotherapy FOLFOX q 2 weeks started.  Not elgible for clinical trial and bevacimab based on stent and high risk for perforation.    07/30/2013 Tumor Marker KRAS positive.  p53,  APC identified. Mismatch Repair (MMR) preserved.    08/06/2013 Adverse Reaction Complains of darkening of her hands with peeling.    08/27/2013 Family History She was seen by Genetic couselor.  She declined testing today, but will call and reschedule an appointment should she decide to pursue testing.   09/07/2013 Imaging CT abdomen: Mixed response to therapy. Response to therapy of hepatic metastasis. right hepatic hemangiomas are again identified. Other too small to characterize liver lesions are felt to be similar but are indeterminate.. Enlargement of a splenic lesions   09/13/2013 Tumor Marker CEA 33.5   09/13/2013 Treatment Plan Change Reviewed scans consistent with mixed response.  Switch to FOLFIRI.  Provided script for immodium.    10/29/2013 Adverse Reaction Patient reports intense abdominal cramping lasting the first few days on chemotherapy.  Starting Hyoscyamine 0.125 mg q 6 hours prn.    11/15/2013 - 11/15/2013 Hospital Admission Patient presented to ED with migrated stent.  Stent retrieved by Dr. Ardis Hughs.  Imaging negative for perforation.   Discussed in conference with referral to Dr. Zella Richer made.     INTERVAL HISTORY: [name] [name] 63 y.o. female with a history of Stage IV colon cancer is here for follow-up.  She was last seen by me on 11/27/2013   Today, she reports her bowel movements are regular and that she is with minimal pain. She is not having intermittent diarrhea.  Her abdominal cramping has subsided since her colorectal stent was migrated and retrieved on 11/15/2013.   Marland Kitchen She denies any fevers or chills. She also reports that her weight is stable with an adequate appetite. She is very active  and handles her basic, intermediate and advanced ADLs without difficulties. She stated she had nausea and vomiting the first 2 days of each cycle.  She reports good energy and appetite.    MEDICAL HISTORY: Past Medical History  Diagnosis Date  . Anxiety   . Colon cancer     dx'd 2014     INTERIM HISTORY: has Rectal mass; Colon obstruction; Hypercalcemia; Colorectal cancer, stage IV; Family history of colorectal cancer; Thrush, oral; Tachycardia; and Migrated colon stent on her problem list.    ALLERGIES:  is allergic to codeine.  MEDICATIONS: has a current medication list which includes the following prescription(s): hyoscyamine, lidocaine-prilocaine, loperamide, lorazepam, ondansetron, and polyethylene glycol, and the following Facility-Administered Medications: sodium chloride.  SURGICAL HISTORY:  Past Surgical History  Procedure Laterality Date  . Abdominal hysterectomy  2005  . Cesarean section  1976  . Flexible sigmoidoscopy N/A 05/25/2013    Procedure: FLEXIBLE SIGMOIDOSCOPY;  Surgeon: [name], MD;  Location: [facility];  Service: Endoscopy;  Laterality: N/A;  needs floro  . Colonic stent placement N/A 05/25/2013    Procedure: COLONIC STENT PLACEMENT;  Surgeon: [name], MD;  Location: [facility];  Service: Endoscopy;  Laterality: N/A;  . Portacath placement Right 06/25/2013    Procedure: ULTRA SOUND GUIDED INSERTION PORT-A-CATH;  Surgeon: [name], MD;  Location: [facility];  Service: General;  Laterality: Right;    REVIEW OF SYSTEMS:   Constitutional: Denies fevers, chills or abnormal weight loss Eyes: Denies blurriness of vision Ears, nose, mouth, throat, and face: Denies mucositis or sore throat Respiratory: Denies cough, dyspnea or wheezes Cardiovascular: Denies palpitation, chest discomfort or lower extremity swelling Gastrointestinal:  Denies nausea, heartburn or change in bowel habits Skin: Denies abnormal skin rashes Lymphatics: Denies new lymphadenopathy or easy bruising Neurological:Denies numbness, tingling or new weaknesses Behavioral/Psych: Mood is stable, no new changes  All other systems were reviewed with the patient and are negative.  PHYSICAL EXAMINATION: ECOG PERFORMANCE STATUS: 0 -  Asymptomatic  Blood pressure 133/61, pulse 80, temperature 98.7 F (37.1 C), temperature source Oral, resp. rate 18, height 5' 4.5" (1.638 m), weight 151 lb 8 oz (68.72 kg), SpO2 100.00%.  GENERAL:alert, no distress and comfortable; well-developed, well nourished.  SKIN: skin color, texture, turgor are normal, no rashes or significant lesions; + R Port a cath without tenderness  EYES: normal, Conjunctiva are pink and non-injected, sclera clear  OROPHARYNX:no exudate, no erythema and lips, buccal mucosa  NECK: supple, thyroid normal size, non-tender, without nodularity  LYMPH: no palpable lymphadenopathy in the cervical, axillary or supraclavicular  LUNGS: clear to auscultation and percussion with normal breathing effort  HEART: Tachycardic with regular rhythm and no murmurs and no lower extremity edema  ABDOMEN:abdomen soft, non-tender and normal bowel sounds Musculoskeletal:no cyanosis of digits and no clubbing  NEURO: alert & oriented x 3 with fluent speech, no focal motor/sensory deficits  Labs:  Lab Results  Component Value Date   WBC 5.0 12/10/2013   HGB 10.8* 12/10/2013   HCT 33.8* 12/10/2013   MCV 87.2 12/10/2013   PLT 287 12/10/2013   NEUTROABS 3.0 12/10/2013      Chemistry      Component Value Date/Time   NA 139 12/10/2013 1028   NA 137 11/15/2013 0615   K 5.0 12/10/2013 1028   K 4.3 11/15/2013 0615   CL 99 11/15/2013 0615   CO2 24 12/10/2013 1028   CO2 24 11/15/2013 0615   BUN 13.7 12/10/2013 1028  BUN 19 11/15/2013 0615   CREATININE 0.8 12/10/2013 1028   CREATININE 0.91 11/15/2013 0615      Component Value Date/Time   CALCIUM 12.2* 12/10/2013 1028   CALCIUM 12.7* 11/15/2013 0615   ALKPHOS 59 12/10/2013 1028   ALKPHOS 89 05/22/2013 0930   AST 14 12/10/2013 1028   AST 26 05/22/2013 0930   ALT 11 12/10/2013 1028   ALT 15 05/22/2013 0930   BILITOT <0.20 12/10/2013 1028   BILITOT 0.2* 05/22/2013 0930       Basic Metabolic Panel:  Recent Labs Lab 12/10/13 1028  NA 139  K 5.0  CO2 24   GLUCOSE 92  BUN 13.7  CREATININE 0.8  CALCIUM 12.2*   GFR Estimated Creatinine Clearance: 71.1 ml/min (by C-G formula based on Cr of 0.8). Liver Function Tests:  Recent Labs Lab 12/10/13 1028  AST 14  ALT 11  ALKPHOS 59  BILITOT <0.20  PROT 7.6  ALBUMIN 3.2*    CBC:  Recent Labs Lab 12/10/13 1027  WBC 5.0  NEUTROABS 3.0  HGB 10.8*  HCT 33.8*  MCV 87.2  PLT 287   Results for IOANNA, COLQUHOUN (MRN [id number]) as of 12/10/2013 11:51  Ref. Range 07/02/2013 09:54 07/23/2013 09:46 09/13/2013 08:30 10/15/2013 08:28 11/12/2013 08:16  CEA Latest Range: 0.0-5.0 ng/mL 272.3 (H) 286.2 (H) 33.5 (H) 22.3 (H) 13.6 (H)   Studies:  No results found.   RADIOGRAPHIC STUDIES: 11/23/2013 CT CHEST WITH CONTRAST TECHNIQUE: Multidetector CT imaging of the chest was performed during  intravenous contrast administration. CONTRAST: 64m OMNIPAQUE IOHEXOL 300 MG/ML SOLN  COMPARISON: CT 07/24/2013 FINDINGS: Port right anterior chest wall. No axillary or supraclavicular  lymphadenopathy. No mediastinal hilar lymphadenopathy. No pericardial fluid. No central pulmonary embolism. Review of the lung parenchyma demonstrates a 5 mm nodule at the left lung base unchanged (image 3 series 5). Airways are normal. Limited view of the upper abdomen demonstrates a large 7.5 cm lesion within the right hepatic lobe consistent hemangioma. Benign left hepatic lobe there is a focus of calcification (image 47, series 2 at site of prior for metastasis. Multiple additional small lesions in the more superior right leiomyoma also likely represent hemangiomas. Rounded lesion within the anterior margin of the spleen measuring 49 x 46 mm. This is increased in 45 x 38 mm on CT of 05/22/2013. IMPRESSION:  1. Stable left lower lobe 5 mm pulmonary nodule. 2. Interval increase in size splenic mass is concerning for metastatic lesion. 3. Interval calcification of the left hepatic lobe metastasis. No evidence of active residual disease  by CT. 4. Stable benign hepatic hemangiomas.    ASSESSMENT: CPark Breed89y.o. female with a history of Colorectal cancer, stage IV - Plan: CBC with Differential, Comprehensive metabolic panel (Cmet) - [facility], Lactate dehydrogenase (LDH) - [facility], Ambulatory referral to General Surgery  Family history of colorectal cancer  Migrated colon stent - Plan: Ambulatory referral to General Surgery  Rectal mass   ASSESSMENT: 63yo AAF with family history of colon cancer, admitted with obstructive mass s/p stent and biopsy on 11/21 consistent with metastatic colon cancer with mets to liver, spleen. ECOG 0. Elevated CEA.  PLAN:   1. Migrated colorectal stent, s/p retrieval on 05/14 --We discussed her case doing GI conference.  We will refer to Dr. RZella Richerfor consideration of surgery if indicated.  Dr. JArdis Hughsis also following.   2. 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  -- Today, she reports feeling well overall.  We changed her chemotherapy to FOLFIRI.   There is no difference in overall response rate, time to progression and overall survival for patients treated with FOLFIRI versus FOLFOX4 in the treatment of advanced colorectal cancer. [facility], 2005, Aug 1; 23(22): 6468-03. For FOLFIRI and FOLFOX respectively, the median to to progression was 7 months versus 7 months; duration of response was 9 versus 10 months; overall survival was 14 versus 15 months and overall response rates were 31% versus 34%. Other potential therapies if progression is 5-FU/LV, CapeOx or capecitabine. We will continue FOLFORI on today 12/10/2013 q 14 day until progression or unacceptable toxicity.  It consists of the following:   Day # 1 Irinotecan 322 mg for 90 minutes  Leucovorin 400 mg/m2 for 2 hours before 5-FU d1  Fluorouracil 400 mg/m2 once starting 2.5 After treatment start time  Days #1/2 Fluorouracil 2,400 mg/m2 for 46 hours  Day #3 Pump D/C    The reference is from [place]. Al, Irinotecan combined with fluorouracil compared with fluororacil alone as first-line treatment for metastatic colorectal cancer: a multicenter randomised trial.  Lancet 2000 Apr 15; 355 (2122):  In addition, we previously provided an in depth discussion concerning the indications and side-effects of therapy. Side-effects includes but is not limited to bone marrow suppression (i.e., anemia, thrombocytopenia or leukopenia which can lead to life threatening infections), nausea or vomiting, gastrointestinal effects including diarrhea and electrolyte imbalances, neurotoxicity mainly from oxaliplatinum, liver and/or kidney dysfunction. We will not provide neulasta unless she has febrile neutropenia as risk of febrile neutropenia is less than 5%. We will follow labs prior to chemotherapy. She understood the indications, benefits and risk of therapy and chose to proceed.    --We will obtained a restaging CT (but abdomen and pelvis was not approved).  CT of chest overall demonstrates stable disease.  Her CEA from today is pending but was 13.6 on 11/12/2013.   3. Family history of Colon cancer.  --Based on her age and family history (sister deceased at age 24 from colon cancer), referral for genetic testing for Lynch syndrome, i.e. MSI or IHC, would be appropriate. We made a referral to genetics. Her tumor was negative for lynch syndrome. She was seen on 02/23 but declined further testing for genetic syndromes presently.   4. 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  --Parathyroid hormone elevated 192.5 and parathyroid hormone-related peptide pending. PTH is generally low when hypercalcemia is secondary to malignancy. In primary PTH, it is elevated. We counseled continue aggressive fluid hydration. She was instructed to report to emergency room with worsening constipation or confusion. We referred to endocrinology for further evaluation. She was seen by Dr. Dwyane Dee.   We will give intermittent zometa.     5. Follow-up.  --Patient instructed to follow-up 2 weeks for labs, symptom visit, and consideration of next chemotherapy cycle. We will follow CEA monthly.   All questions were answered. The patient knows to call the clinic with any problems, questions or concerns. We can certainly see the patient much sooner if necessary.  I spent 15 minutes counseling the patient face to face. The total time spent in the appointment was 25 minutes.    Concha Norway, MD 12/10/2013 11:53 AM

## 2013-12-10 NOTE — Patient Instructions (Signed)
[facility] Discharge Instructions for Patients Receiving Chemotherapy  Today you received the following chemotherapy agents Camptosar, Leucovorin, Adrucil.  To help prevent nausea and vomiting after your treatment, we encourage you to take your nausea medication as prescribed.  If you develop nausea and vomiting that is not controlled by your nausea medication, call the clinic.   BELOW ARE SYMPTOMS THAT SHOULD BE REPORTED IMMEDIATELY:  *FEVER GREATER THAN 100.5 F  *CHILLS WITH OR WITHOUT FEVER  NAUSEA AND VOMITING THAT IS NOT CONTROLLED WITH YOUR NAUSEA MEDICATION  *UNUSUAL SHORTNESS OF BREATH  *UNUSUAL BRUISING OR BLEEDING  TENDERNESS IN MOUTH AND THROAT WITH OR WITHOUT PRESENCE OF ULCERS  *URINARY PROBLEMS  *BOWEL PROBLEMS  UNUSUAL RASH Items with * indicate a potential emergency and should be followed up as soon as possible.  Feel free to call the clinic you have any questions or concerns. The clinic phone number is [phone].

## 2013-12-11 LAB — CEA: CEA: 8.3 ng/mL — AB (ref 0.0–5.0)

## 2013-12-12 ENCOUNTER — Ambulatory Visit (HOSPITAL_BASED_OUTPATIENT_CLINIC_OR_DEPARTMENT_OTHER): Payer: Medicaid Other

## 2013-12-12 VITALS — BP 114/52 | HR 99 | Temp 97.0°F

## 2013-12-12 DIAGNOSIS — C19 Malignant neoplasm of rectosigmoid junction: Secondary | ICD-10-CM

## 2013-12-12 MED ORDER — SODIUM CHLORIDE 0.9 % IJ SOLN
10.0000 mL | INTRAMUSCULAR | Status: DC | PRN
  Administered 2013-12-12: 10 mL
  Filled 2013-12-12: qty 10

## 2013-12-12 MED ORDER — HEPARIN SOD (PORK) LOCK FLUSH 100 UNIT/ML IV SOLN
500.0000 [IU] | Freq: Once | INTRAVENOUS | Status: AC | PRN
  Administered 2013-12-12: 500 [IU]
  Filled 2013-12-12: qty 5

## 2013-12-17 NOTE — Telephone Encounter (Signed)
1 

## 2013-12-24 ENCOUNTER — Other Ambulatory Visit: Payer: Self-pay

## 2013-12-24 ENCOUNTER — Telehealth: Payer: Self-pay | Admitting: Internal Medicine

## 2013-12-24 ENCOUNTER — Other Ambulatory Visit (HOSPITAL_BASED_OUTPATIENT_CLINIC_OR_DEPARTMENT_OTHER): Payer: Medicaid Other

## 2013-12-24 ENCOUNTER — Ambulatory Visit (HOSPITAL_BASED_OUTPATIENT_CLINIC_OR_DEPARTMENT_OTHER): Payer: Medicaid Other

## 2013-12-24 ENCOUNTER — Ambulatory Visit (HOSPITAL_BASED_OUTPATIENT_CLINIC_OR_DEPARTMENT_OTHER): Payer: Medicaid Other | Admitting: Internal Medicine

## 2013-12-24 VITALS — BP 115/48 | HR 89 | Temp 97.9°F | Resp 18 | Ht 64.5 in | Wt 151.6 lb

## 2013-12-24 DIAGNOSIS — C787 Secondary malignant neoplasm of liver and intrahepatic bile duct: Secondary | ICD-10-CM

## 2013-12-24 DIAGNOSIS — C19 Malignant neoplasm of rectosigmoid junction: Secondary | ICD-10-CM

## 2013-12-24 DIAGNOSIS — Z800 Family history of malignant neoplasm of digestive organs: Secondary | ICD-10-CM

## 2013-12-24 DIAGNOSIS — C7889 Secondary malignant neoplasm of other digestive organs: Secondary | ICD-10-CM

## 2013-12-24 DIAGNOSIS — Z5111 Encounter for antineoplastic chemotherapy: Secondary | ICD-10-CM

## 2013-12-24 DIAGNOSIS — T85528D Displacement of other gastrointestinal prosthetic devices, implants and grafts, subsequent encounter: Secondary | ICD-10-CM

## 2013-12-24 DIAGNOSIS — R110 Nausea: Secondary | ICD-10-CM

## 2013-12-24 LAB — COMPREHENSIVE METABOLIC PANEL (CC13)
ALT: 10 U/L (ref 0–55)
ANION GAP: 8 meq/L (ref 3–11)
AST: 13 U/L (ref 5–34)
Albumin: 3.1 g/dL — ABNORMAL LOW (ref 3.5–5.0)
Alkaline Phosphatase: 57 U/L (ref 40–150)
BUN: 16.1 mg/dL (ref 7.0–26.0)
CO2: 23 meq/L (ref 22–29)
Calcium: 11.6 mg/dL — ABNORMAL HIGH (ref 8.4–10.4)
Chloride: 106 mEq/L (ref 98–109)
Creatinine: 0.8 mg/dL (ref 0.6–1.1)
GLUCOSE: 91 mg/dL (ref 70–140)
Potassium: 4.6 mEq/L (ref 3.5–5.1)
SODIUM: 138 meq/L (ref 136–145)
TOTAL PROTEIN: 7.3 g/dL (ref 6.4–8.3)
Total Bilirubin: <0.2 mg/dL (ref 0.20–1.20)

## 2013-12-24 LAB — CBC WITH DIFFERENTIAL/PLATELET
BASO%: 0.7 % (ref 0.0–2.0)
Basophils Absolute: 0 10*3/uL (ref 0.0–0.1)
EOS%: 3.1 % (ref 0.0–7.0)
Eosinophils Absolute: 0.2 10*3/uL (ref 0.0–0.5)
HCT: 31.5 % — ABNORMAL LOW (ref 34.8–46.6)
HGB: 10.4 g/dL — ABNORMAL LOW (ref 11.6–15.9)
LYMPH%: 22 % (ref 14.0–49.7)
MCH: 29 pg (ref 25.1–34.0)
MCHC: 32.9 g/dL (ref 31.5–36.0)
MCV: 88.2 fL (ref 79.5–101.0)
MONO#: 0.5 10*3/uL (ref 0.1–0.9)
MONO%: 9.9 % (ref 0.0–14.0)
NEUT#: 3.4 10*3/uL (ref 1.5–6.5)
NEUT%: 64.3 % (ref 38.4–76.8)
Platelets: 288 10*3/uL (ref 145–400)
RBC: 3.57 10*6/uL — AB (ref 3.70–5.45)
RDW: 17.3 % — ABNORMAL HIGH (ref 11.2–14.5)
WBC: 5.3 10*3/uL (ref 3.9–10.3)
lymph#: 1.2 10*3/uL (ref 0.9–3.3)

## 2013-12-24 LAB — LACTATE DEHYDROGENASE (CC13): LDH: 150 U/L (ref 125–245)

## 2013-12-24 MED ORDER — LEUCOVORIN CALCIUM INJECTION 350 MG
400.0000 mg/m2 | Freq: Once | INTRAVENOUS | Status: AC
  Administered 2013-12-24: 700 mg via INTRAVENOUS
  Filled 2013-12-24: qty 35

## 2013-12-24 MED ORDER — SODIUM CHLORIDE 0.9 % IV SOLN
Freq: Once | INTRAVENOUS | Status: AC
  Administered 2013-12-24: 10:00:00 via INTRAVENOUS

## 2013-12-24 MED ORDER — LORAZEPAM 2 MG/ML IJ SOLN
0.5000 mg | Freq: Once | INTRAMUSCULAR | Status: AC
  Administered 2013-12-24: 0.5 mg via INTRAVENOUS

## 2013-12-24 MED ORDER — DEXAMETHASONE SODIUM PHOSPHATE 20 MG/5ML IJ SOLN
20.0000 mg | Freq: Once | INTRAMUSCULAR | Status: AC
  Administered 2013-12-24: 20 mg via INTRAVENOUS

## 2013-12-24 MED ORDER — FLUOROURACIL CHEMO INJECTION 2.5 GM/50ML
400.0000 mg/m2 | Freq: Once | INTRAVENOUS | Status: AC
  Administered 2013-12-24: 700 mg via INTRAVENOUS
  Filled 2013-12-24: qty 14

## 2013-12-24 MED ORDER — DEXAMETHASONE SODIUM PHOSPHATE 20 MG/5ML IJ SOLN
INTRAMUSCULAR | Status: AC
  Filled 2013-12-24: qty 5

## 2013-12-24 MED ORDER — SODIUM CHLORIDE 0.9 % IV SOLN
2400.0000 mg/m2 | INTRAVENOUS | Status: DC
  Administered 2013-12-24: 4300 mg via INTRAVENOUS
  Filled 2013-12-24: qty 86

## 2013-12-24 MED ORDER — ATROPINE SULFATE 1 MG/ML IJ SOLN
INTRAMUSCULAR | Status: AC
  Filled 2013-12-24: qty 1

## 2013-12-24 MED ORDER — IRINOTECAN HCL CHEMO INJECTION 100 MG/5ML
180.0000 mg/m2 | Freq: Once | INTRAVENOUS | Status: AC
  Administered 2013-12-24: 320 mg via INTRAVENOUS
  Filled 2013-12-24: qty 16

## 2013-12-24 MED ORDER — LORAZEPAM 0.5 MG PO TABS: 0.5000 mg | ORAL_TABLET | Freq: Four times a day (QID) | ORAL | Status: DC | PRN

## 2013-12-24 MED ORDER — ATROPINE SULFATE 1 MG/ML IJ SOLN
0.5000 mg | Freq: Once | INTRAMUSCULAR | Status: AC | PRN
  Administered 2013-12-24: 0.5 mg via INTRAVENOUS

## 2013-12-24 MED ORDER — LORAZEPAM 2 MG/ML IJ SOLN
INTRAMUSCULAR | Status: AC
  Filled 2013-12-24: qty 1

## 2013-12-24 MED ORDER — ONDANSETRON HCL 8 MG PO TABS: ORAL_TABLET | ORAL | Status: DC

## 2013-12-24 MED ORDER — ONDANSETRON 16 MG/50ML IVPB (CHCC)
16.0000 mg | Freq: Once | INTRAVENOUS | Status: AC
  Administered 2013-12-24: 16 mg via INTRAVENOUS

## 2013-12-24 MED ORDER — ONDANSETRON 16 MG/50ML IVPB (CHCC)
INTRAVENOUS | Status: AC
  Filled 2013-12-24: qty 16

## 2013-12-24 NOTE — Progress Notes (Signed)
1145 am- Pt complained of being nauseated. She states that she always get nauseated right at the end. I spoke with Dr. Juliann Mule and orders for ativan given.

## 2013-12-24 NOTE — Progress Notes (Signed)
[name] [name] OFFICE PROGRESS NOTE  [name], [name], MD [place] [address]  DIAGNOSIS: Colorectal cancer, stage IV - Plan: CBC with Differential, Comprehensive metabolic panel (Cmet) - [facility], Lactate dehydrogenase (LDH) - [facility], CEA  Family history of colorectal cancer  Hypercalcemia  Migrated colon stent, subsequent encounter  Malignant neoplasm of rectosigmoid junction - Plan: ondansetron (ZOFRAN) 8 MG tablet  Chief Complaint  Patient presents with  . Colorectal cancer, stage IV    CURRENT TREATMENT: Folfox q 14 weeks (07/09/2013 until 08/20/2013).  Switched to [facility] q 14 days due to mixed response (started on 09/13/2013).      Colorectal cancer, stage IV   05/22/2013 - 05/27/2013 Hospital Admission A/w abdominal distention and constipation for 2 days CT scan of her abdomen and pelvis showed area of bowel wall thickening at the rectosigmoid junction concerning for malignancy.   05/22/2013 Imaging CT of Abdomen: bowel wall thickening with shouldering of the proximal and distal margins, involving therectosigmoid junction, supicous liver spleen mets. Colonsocopy recommended.    05/23/2013 Tumor Marker CEA 225.7   05/24/2013 Imaging MRI of abdomen: 3.7 x 4.4 cm enhancing lesion along the superior spleen, indeterminate but worrisome for metastasis.2.7 x 3.9 cm enhancing lesion in the medial segment left hepatic lobe,    05/25/2013 Pathology Diagnosis Rectum, biopsy, proximal - INVASIVE ADENOCARCINOMA.   05/25/2013 Procedure Flex sig: (Dr. Ardis Hughs). 5-6 cm obstructing rectosigmoid mass distal end 10 cm from anal verge; mass biopsed and then stented 9 cm long, 22 cm diameter uncovered SEM.    05/25/2013 Initial Diagnosis Colorectal cancer, stage IV   06/25/2013 Procedure R port a cath placement.   07/09/2013 - 08/20/2013 Chemotherapy FOLFOX q 2 weeks started.  Not elgible for clinical trial and bevacimab based on stent and high risk for perforation.    07/30/2013 Tumor  Marker KRAS positive.  p53, APC identified. Mismatch Repair (MMR) preserved.    08/06/2013 Adverse Reaction Complains of darkening of her hands with peeling.    08/27/2013 Family History She was seen by Genetic couselor.  She declined testing today, but will call and reschedule an appointment should she decide to pursue testing.   09/07/2013 Imaging CT abdomen: Mixed response to therapy. Response to therapy of hepatic metastasis. right hepatic hemangiomas are again identified. Other too small to characterize liver lesions are felt to be similar but are indeterminate.. Enlargement of a splenic lesions   09/13/2013 Tumor Marker CEA 33.5   09/13/2013 Treatment Plan Change Reviewed scans consistent with mixed response.  Switch to FOLFIRI.  Provided script for immodium.    10/29/2013 Adverse Reaction Patient reports intense abdominal cramping lasting the first few days on chemotherapy.  Starting Hyoscyamine 0.125 mg q 6 hours prn.    11/15/2013 - 11/15/2013 Hospital Admission Patient presented to ED with migrated stent.  Stent retrieved by Dr. Ardis Hughs.  Imaging negative for perforation.   Discussed in conference with referral to Dr. Zella Richer made.    11/23/2013 Imaging CT Chest. 1. Stable left lower lobe 5 mm pulmonary nodule. 2. Interval increase in size splenic mass is concerning formetastatic lesion.3. Interval calcification of the left hepatic lobe metastasis. Noevidence of active residual disease by CT.   12/10/2013 Tumor Marker CEA 8.3    INTERVAL HISTORY: [name] [name] 63 y.o. female with a history of Stage IV colon cancer is here for follow-up.  She was last seen by me on 12/10/2013   Today, she reports her bowel movements are regular and that she  is with minimal pain. She denies any fevers or chills. She also reports that her weight is stable with an adequate appetite. She is very active and handles her basic, intermediate and advanced ADLs without difficulties. She stated she had abdominal cramps following  the first two days of chemotherapy.  She reports good energy and appetite.    MEDICAL HISTORY: Past Medical History  Diagnosis Date  . Anxiety   . Colon cancer     dx'd 2014    INTERIM HISTORY: has Rectal mass; Colon obstruction; Hypercalcemia; Colorectal cancer, stage IV; Family history of colorectal cancer; Thrush, oral; Tachycardia; and Migrated colon stent on her problem list.    ALLERGIES:  is allergic to codeine.  MEDICATIONS: has a current medication list which includes the following prescription(s): hyoscyamine, lidocaine-prilocaine, loperamide, lorazepam, ondansetron, and polyethylene glycol, and the following Facility-Administered Medications: sodium chloride, fluorouracil (ADRUCIL) 4,300 mg in sodium chloride 0.9 % 150 mL chemo infusion, fluorouracil, irinotecan (CAMPTOSAR) 320 mg in dextrose 5 % 500 mL chemo infusion, and leucovorin 700 mg in dextrose 5 % 250 mL infusion.  SURGICAL HISTORY:  Past Surgical History  Procedure Laterality Date  . Abdominal hysterectomy  2005  . Cesarean section  1976  . Flexible sigmoidoscopy N/A 05/25/2013    Procedure: FLEXIBLE SIGMOIDOSCOPY;  Surgeon: [name], MD;  Location: [facility];  Service: Endoscopy;  Laterality: N/A;  needs floro  . Colonic stent placement N/A 05/25/2013    Procedure: COLONIC STENT PLACEMENT;  Surgeon: [name], MD;  Location: [facility];  Service: Endoscopy;  Laterality: N/A;  . Portacath placement Right 06/25/2013    Procedure: ULTRA SOUND GUIDED INSERTION PORT-A-CATH;  Surgeon: [name], MD;  Location: [facility];  Service: General;  Laterality: Right;    REVIEW OF SYSTEMS:   Constitutional: Denies fevers, chills or abnormal weight loss Eyes: Denies blurriness of vision Ears, nose, mouth, throat, and face: Denies mucositis or sore throat Respiratory: Denies cough, dyspnea or wheezes Cardiovascular: Denies palpitation, chest discomfort or lower extremity  swelling Gastrointestinal:  Denies nausea, heartburn or change in bowel habits Skin: Denies abnormal skin rashes Lymphatics: Denies new lymphadenopathy or easy bruising Neurological:Denies numbness, tingling or new weaknesses Behavioral/Psych: Mood is stable, no new changes  All other systems were reviewed with the patient and are negative.  PHYSICAL EXAMINATION: ECOG PERFORMANCE STATUS: 0 - Asymptomatic  Blood pressure 115/48, pulse 89, temperature 97.9 F (36.6 C), temperature source Oral, resp. rate 18, height 5' 4.5" (1.638 m), weight 151 lb 9.6 oz (68.765 kg), SpO2 100.00%.  GENERAL:alert, no distress and comfortable; well-developed, well nourished.  SKIN: skin color, texture, turgor are normal, no rashes or significant lesions; + R Port a cath without tenderness  EYES: normal, Conjunctiva are pink and non-injected, sclera clear  OROPHARYNX:no exudate, no erythema and lips, buccal mucosa  NECK: supple, thyroid normal size, non-tender, without nodularity  LYMPH: no palpable lymphadenopathy in the cervical, axillary or supraclavicular  LUNGS: clear to auscultation and percussion with normal breathing effort  HEART: Tachycardic with regular rhythm and no murmurs and no lower extremity edema  ABDOMEN:abdomen soft, non-tender and normal bowel sounds Musculoskeletal:no cyanosis of digits and no clubbing  NEURO: alert & oriented x 3 with fluent speech, no focal motor/sensory deficits  Labs:  Lab Results  Component Value Date   WBC 5.3 12/24/2013   HGB 10.4* 12/24/2013   HCT 31.5* 12/24/2013   MCV 88.2 12/24/2013   PLT 288 12/24/2013   NEUTROABS 3.4 12/24/2013  Chemistry      Component Value Date/Time   NA 138 12/24/2013 0803   NA 137 11/15/2013 0615   K 4.6 12/24/2013 0803   K 4.3 11/15/2013 0615   CL 99 11/15/2013 0615   CO2 23 12/24/2013 0803   CO2 24 11/15/2013 0615   BUN 16.1 12/24/2013 0803   BUN 19 11/15/2013 0615   CREATININE 0.8 12/24/2013 0803   CREATININE 0.91 11/15/2013  0615      Component Value Date/Time   CALCIUM 11.6* 12/24/2013 0803   CALCIUM 12.7* 11/15/2013 0615   ALKPHOS 57 12/24/2013 0803   ALKPHOS 89 05/22/2013 0930   AST 13 12/24/2013 0803   AST 26 05/22/2013 0930   ALT 10 12/24/2013 0803   ALT 15 05/22/2013 0930   BILITOT <0.20 12/24/2013 0803   BILITOT 0.2* 05/22/2013 0930       Basic Metabolic Panel:  Recent Labs Lab 12/24/13 0803  NA 138  K 4.6  CO2 23  GLUCOSE 91  BUN 16.1  CREATININE 0.8  CALCIUM 11.6*   GFR Estimated Creatinine Clearance: 71.2 ml/min (by C-G formula based on Cr of 0.8). Liver Function Tests:  Recent Labs Lab 12/24/13 0803  AST 13  ALT 10  ALKPHOS 57  BILITOT <0.20  PROT 7.3  ALBUMIN 3.1*    CBC:  Recent Labs Lab 12/24/13 0803  WBC 5.3  NEUTROABS 3.4  HGB 10.4*  HCT 31.5*  MCV 88.2  PLT 288    Studies:  No results found.  Results for VANESSIA, BOKHARI (MRN [id number]) as of 12/24/2013 11:27  Ref. Range 07/23/2013 09:46 09/13/2013 08:30 10/15/2013 08:28 11/12/2013 08:16 12/10/2013 10:27  CEA Latest Range: 0.0-5.0 ng/mL 286.2 (H) 33.5 (H) 22.3 (H) 13.6 (H) 8.3 (H)   RADIOGRAPHIC STUDIES: None  ASSESSMENT: [name] [name] 63 y.o. female with a history of Colorectal cancer, stage IV - Plan: CBC with Differential, Comprehensive metabolic panel (Cmet) - [facility], Lactate dehydrogenase (LDH) - [facility], CEA  Family history of colorectal cancer  Hypercalcemia  Migrated colon stent, subsequent encounter  Malignant neoplasm of rectosigmoid junction - Plan: ondansetron (ZOFRAN) 8 MG tablet   ASSESSMENT: 63 yo AAF with family history of colon cancer, admitted with obstructive mass s/p stent and biopsy on 11/21 consistent with metastatic colon cancer with mets to liver, spleen. ECOG 0. Elevated CEA.   PLAN:   1. Migrated colorectal stent, s/p retrieval on 05/14 --We discussed her case doing GI conference.  We referred to Dr. Zella Richer for consideration of surgery if indicated and she has an  appointment on 06/30.  Dr. Ardis Hughs is also following.   2. 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  -- Today, she reports feeling well overall.  We changed her chemotherapy to FOLFIRI in February.   There is no difference in overall response rate, time to progression and overall survival for patients treated with FOLFIRI versus FOLFOX4 in the treatment of advanced colorectal cancer. [facility], 2005, Aug 1; 23(22): 7628-31. For FOLFIRI and FOLFOX respectively, the median to to progression was 7 months versus 7 months; duration of response was 9 versus 10 months; overall survival was 14 versus 15 months and overall response rates were 31% versus 34%. Other potential therapies if progression is 5-FU/LV, CapeOx or capecitabine. We will continue FOLFORI on today 12/24/2013 q 14 day until progression or unacceptable toxicity.  It consists of the following:   Day # 1 Irinotecan 322 mg for 90 minutes  Leucovorin 400 mg/m2 for 2 hours before 5-FU d1  Fluorouracil 400 mg/m2 once starting 2.5 After treatment start time  Days #1/2 Fluorouracil 2,400 mg/m2 for 46 hours  Day #3 Pump D/C   The reference is from Chandler. Al, Irinotecan combined with fluorouracil compared with fluororacil alone as first-line treatment for metastatic colorectal cancer: a multicenter randomised trial.  Lancet 2000 Apr 15; 355 (5051):  In addition, we previously provided an in depth discussion concerning the indications and side-effects of therapy. Side-effects includes but is not limited to bone marrow suppression (i.e., anemia, thrombocytopenia or leukopenia which can lead to life threatening infections), nausea or vomiting, gastrointestinal effects including diarrhea and electrolyte imbalances, neurotoxicity mainly from oxaliplatinum, liver and/or kidney dysfunction. We will not provide neulasta unless she has febrile neutropenia as risk of febrile neutropenia is  less than 5%. We will follow labs prior to chemotherapy. She understood the indications, benefits and risk of therapy and chose to proceed.    --We will obtained a restaging CT (but abdomen and pelvis was not approved) as noted above.  CT of chest overall demonstrates stable disease.  Her CEA from 06/08 was 8.3.   3. Family history of Colon cancer.  --Based on her age and family history (sister deceased at age 2 from colon cancer), referral for genetic testing for Lynch syndrome, i.e. MSI or IHC, would be appropriate. We made a referral to genetics. Her tumor was negative for lynch syndrome. She was seen on 02/23 but declined further testing for genetic syndromes presently.   4. 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  --Parathyroid hormone elevated 192.5 and parathyroid hormone-related peptide pending. PTH is generally low when hypercalcemia is secondary to malignancy. In primary PTH, it is elevated. We counseled continue aggressive fluid hydration. She was instructed to report to emergency room with worsening constipation or confusion. We referred to endocrinology for further evaluation. She was seen by Dr. Dwyane Dee.  We will give intermittent zometa.     5. Follow-up.  --Patient instructed to follow-up 2 weeks for labs, symptom visit, and consideration of next chemotherapy cycle. We will follow CEA monthly.   All questions were answered. The patient knows to call the clinic with any problems, questions or concerns. We can certainly see the patient much sooner if necessary.  I spent 15 minutes counseling the patient face to face. The total time spent in the appointment was 25 minutes.    [name], [name], MD 12/24/2013 11:26 AM

## 2013-12-24 NOTE — Telephone Encounter (Signed)
gv adn printed appt sched and avs for pt for June and July....sed added tx. °

## 2013-12-24 NOTE — Patient Instructions (Signed)
[facility] Discharge Instructions for Patients Receiving Chemotherapy  Today you received the following chemotherapy agents Leucovorin, 5 FU, Irinotican.   To help prevent nausea and vomiting after your treatment, we encourage you to take your nausea medication as directed/prescribed   If you develop nausea and vomiting that is not controlled by your nausea medication, call the clinic.   BELOW ARE SYMPTOMS THAT SHOULD BE REPORTED IMMEDIATELY:  *FEVER GREATER THAN 100.5 F  *CHILLS WITH OR WITHOUT FEVER  NAUSEA AND VOMITING THAT IS NOT CONTROLLED WITH YOUR NAUSEA MEDICATION  *UNUSUAL SHORTNESS OF BREATH  *UNUSUAL BRUISING OR BLEEDING  TENDERNESS IN MOUTH AND THROAT WITH OR WITHOUT PRESENCE OF ULCERS  *URINARY PROBLEMS  *BOWEL PROBLEMS  UNUSUAL RASH Items with * indicate a potential emergency and should be followed up as soon as possible.  Feel free to call the clinic you have any questions or concerns. The clinic phone number is [phone].

## 2013-12-25 ENCOUNTER — Other Ambulatory Visit: Payer: Self-pay

## 2013-12-26 ENCOUNTER — Ambulatory Visit (HOSPITAL_BASED_OUTPATIENT_CLINIC_OR_DEPARTMENT_OTHER): Payer: Medicaid Other

## 2013-12-26 VITALS — BP 97/53 | HR 100 | Temp 97.0°F

## 2013-12-26 DIAGNOSIS — C19 Malignant neoplasm of rectosigmoid junction: Secondary | ICD-10-CM

## 2013-12-26 DIAGNOSIS — Z452 Encounter for adjustment and management of vascular access device: Secondary | ICD-10-CM

## 2013-12-26 MED ORDER — SODIUM CHLORIDE 0.9 % IJ SOLN
10.0000 mL | INTRAMUSCULAR | Status: DC | PRN
  Administered 2013-12-26: 10 mL
  Filled 2013-12-26: qty 10

## 2013-12-26 MED ORDER — HEPARIN SOD (PORK) LOCK FLUSH 100 UNIT/ML IV SOLN
500.0000 [IU] | Freq: Once | INTRAVENOUS | Status: AC | PRN
  Administered 2013-12-26: 500 [IU]
  Filled 2013-12-26: qty 5

## 2013-12-30 ENCOUNTER — Other Ambulatory Visit: Payer: Self-pay | Admitting: Internal Medicine

## 2013-12-31 ENCOUNTER — Telehealth: Payer: Self-pay | Admitting: *Deleted

## 2013-12-31 ENCOUNTER — Other Ambulatory Visit: Payer: Self-pay | Admitting: Internal Medicine

## 2013-12-31 DIAGNOSIS — C19 Malignant neoplasm of rectosigmoid junction: Secondary | ICD-10-CM

## 2013-12-31 NOTE — Telephone Encounter (Signed)
Pt called and would like to ask Dr. Juliann Mule to be off chemo next week.  Pt did not give reasons.  Pt stated she would resume chemo as scheduled after next week. Pt's  Phone    [phone].

## 2014-01-01 ENCOUNTER — Telehealth: Payer: Self-pay | Admitting: *Deleted

## 2014-01-01 ENCOUNTER — Encounter (INDEPENDENT_AMBULATORY_CARE_PROVIDER_SITE_OTHER): Payer: Self-pay | Admitting: General Surgery

## 2014-01-01 ENCOUNTER — Encounter: Payer: Self-pay | Admitting: Internal Medicine

## 2014-01-01 ENCOUNTER — Ambulatory Visit (INDEPENDENT_AMBULATORY_CARE_PROVIDER_SITE_OTHER): Payer: Medicaid Other | Admitting: General Surgery

## 2014-01-01 ENCOUNTER — Other Ambulatory Visit: Payer: Self-pay | Admitting: Internal Medicine

## 2014-01-01 ENCOUNTER — Telehealth: Payer: Self-pay | Admitting: Internal Medicine

## 2014-01-01 VITALS — BP 130/78 | HR 92 | Resp 16 | Ht 64.5 in | Wt 149.8 lb

## 2014-01-01 DIAGNOSIS — C19 Malignant neoplasm of rectosigmoid junction: Secondary | ICD-10-CM

## 2014-01-01 NOTE — Telephone Encounter (Signed)
Per staff message and POF I have scheduled appts. Advised scheduler of appts. JMW  

## 2014-01-01 NOTE — Progress Notes (Signed)
Returned pt's phone call.  Requested she call me back.

## 2014-01-01 NOTE — Progress Notes (Signed)
Patient ID: [name] [name], female   DOB: 1951-06-09, 63 y.o.   MRN: [id number]  Chief Complaint  Patient presents with  . colorectal cancer    HPI [name] [name] is a 63 y.o. female.   HPI  She is well known to me.  She is here to discuss palliative partial colectomy for her stage IV colorectal cancer. Recently, her stent came out. She has no obstructive symptoms. She is still undergoing chemotherapy. CT scan from March demonstrated some response of the liver lesions but some progression in the pelvic area was suspected adenopathy. Lesion in the spleen was also larger. Recent CT of the chest demonstrated stability of the lung lesions.  Past Medical History  Diagnosis Date  . Anxiety   . Colon cancer     dx'd 2014    Past Surgical History  Procedure Laterality Date  . Abdominal hysterectomy  2005  . Cesarean section  1976  . Flexible sigmoidoscopy N/A 05/25/2013    Procedure: FLEXIBLE SIGMOIDOSCOPY;  Surgeon: [name], MD;  Location: [facility];  Service: Endoscopy;  Laterality: N/A;  needs floro  . Colonic stent placement N/A 05/25/2013    Procedure: COLONIC STENT PLACEMENT;  Surgeon: [name], MD;  Location: [facility];  Service: Endoscopy;  Laterality: N/A;  . Portacath placement Right 06/25/2013    Procedure: ULTRA SOUND GUIDED INSERTION PORT-A-CATH;  Surgeon: [name], MD;  Location: [facility];  Service: General;  Laterality: Right;    Family History  Problem Relation Age of Onset  . Colon cancer Sister 61  . Diabetes Neg Hx   . Thyroid disease Neg Hx   . Breast cancer Maternal Aunt   . Prostate cancer Maternal Uncle   . Bone cancer Maternal Grandfather   . Prostate cancer Maternal Uncle     Social History History  Substance Use Topics  . Smoking status: Former Smoker -- 1 years    Types: Cigarettes    Quit date: 05/24/1971  . Smokeless tobacco: Never Used  . Alcohol Use: No    Allergies  Allergen Reactions  .  Codeine Itching and Nausea And Vomiting    Current Outpatient Prescriptions  Medication Sig Dispense Refill  . hyoscyamine (LEVSIN, ANASPAZ) 0.125 MG tablet Take 1 tablet (0.125 mg total) by mouth every 6 (six) hours as needed.  30 tablet  1  . lidocaine-prilocaine (EMLA) cream Apply 1 application topically as needed. Apply to port site one hour before treatment and cover with plastic wrap.  30 g  3  . loperamide (IMODIUM A-D) 2 MG tablet Take 2 at onset of diarrhea, then 1 every 2hrs until 12hr without a BM. May take 2 tab every 4hrs at bedtime. If diarrhea recurs repeat.  100 tablet  1  . LORazepam (ATIVAN) 0.5 MG tablet Take 1 tablet (0.5 mg total) by mouth every 6 (six) hours as needed (nausea/vomiting).  30 tablet  2  . ondansetron (ZOFRAN) 8 MG tablet Take 1 tablet (8mg ) by mouth 2 times a day starting the day after chemo for 2 days. Then take 1 tablet (8mg ) twice a day as needed for nausea or vomiting.  30 tablet  1  . polyethylene glycol powder (GLYCOLAX/MIRALAX) powder TAKE 34 G BY MOUTH DAILY AS NEEDED FOR MILD CONSTIPATION OR MODERATE CONSTIPATION  255 g  0   No current facility-administered medications for this visit.   Facility-Administered Medications Ordered in Other Visits  Medication Dose Route Frequency Provider Last Rate Last  Dose  . 0.9 %  sodium chloride infusion  1,000 mL Intravenous Continuous Concha Norway, MD   1,000 mL at 10/17/13 1250    Review of Systems Review of Systems  Constitutional: Negative for appetite change and unexpected weight change.  Gastrointestinal: Positive for constipation (Intermittent). Negative for abdominal pain and abdominal distention.    Blood pressure 130/78, pulse 92, resp. rate 16, height 5' 4.5" (1.638 m), weight 149 lb 12.8 oz (67.949 kg).  Physical Exam Physical Exam  Constitutional: She appears well-developed and well-nourished. No distress.  HENT:  Head: Normocephalic and atraumatic.  Eyes: No scleral icterus.   Cardiovascular: Normal rate and regular rhythm.   Pulmonary/Chest: Effort normal and breath sounds normal.  Abdominal: Soft. She exhibits no distension and no mass. There is no tenderness.  Lower midline scar.  Neurological: She is alert.  Skin: Skin is warm and dry.  Psychiatric: She has a normal mood and affect. Her behavior is normal.    Data Reviewed CT scans. CEA level which is now 8. Note from Dr. Juliann Mule. Phone conversation with Dr. Ardis Hughs.  Assessment    Stage IV colorectal cancer with some evidence of response to chemotherapy but other evidence of progression. Colorectal stent migrated out. No obstructive symptoms at this time.     Plan    We discussed a palliative laparoscopic-assisted partial colectomy with a possible colostomy.  I went over the procedure and risks of colon resection.  Risks include but are not limited to bleeding, infection, wound problems, anesthesia, anastomotic leak, need for colostomy, injury to intraabominal organs (such as intestine, spleen, kidney, bladder, ureter, etc.), ileus.  All her questions were answered. She has elected not to proceed with this operation at the time. We discussed the fact that she could become obstructed again. If that happens, she would like to have the stent placed again. She does not want to have a colostomy. I told her that might be the only alternative versus doing nothing .I asked her to call back if she changed her mind about the surgery.       ROSENBOWER,TODD J 01/01/2014, 3:23 PM

## 2014-01-01 NOTE — Patient Instructions (Signed)
Call us if you change your mind about having the surgery.

## 2014-01-01 NOTE — Telephone Encounter (Signed)
Called pt r/s from 07/01 to 07/16, pt confirmed.

## 2014-01-07 ENCOUNTER — Ambulatory Visit: Payer: Medicaid Other | Admitting: Physician Assistant

## 2014-01-07 ENCOUNTER — Other Ambulatory Visit: Payer: Medicaid Other

## 2014-01-07 ENCOUNTER — Inpatient Hospital Stay: Payer: Medicaid Other

## 2014-01-14 ENCOUNTER — Ambulatory Visit (HOSPITAL_BASED_OUTPATIENT_CLINIC_OR_DEPARTMENT_OTHER): Payer: Medicaid Other | Admitting: Internal Medicine

## 2014-01-14 ENCOUNTER — Telehealth: Payer: Self-pay | Admitting: *Deleted

## 2014-01-14 ENCOUNTER — Encounter: Payer: Self-pay | Admitting: Internal Medicine

## 2014-01-14 ENCOUNTER — Telehealth: Payer: Self-pay | Admitting: Internal Medicine

## 2014-01-14 ENCOUNTER — Ambulatory Visit (HOSPITAL_BASED_OUTPATIENT_CLINIC_OR_DEPARTMENT_OTHER): Payer: Medicaid Other

## 2014-01-14 ENCOUNTER — Other Ambulatory Visit (HOSPITAL_BASED_OUTPATIENT_CLINIC_OR_DEPARTMENT_OTHER): Payer: Medicaid Other

## 2014-01-14 VITALS — BP 126/65 | HR 94 | Temp 98.2°F | Resp 20 | Ht 64.5 in | Wt 152.0 lb

## 2014-01-14 DIAGNOSIS — C787 Secondary malignant neoplasm of liver and intrahepatic bile duct: Secondary | ICD-10-CM

## 2014-01-14 DIAGNOSIS — C7889 Secondary malignant neoplasm of other digestive organs: Secondary | ICD-10-CM

## 2014-01-14 DIAGNOSIS — K56609 Unspecified intestinal obstruction, unspecified as to partial versus complete obstruction: Secondary | ICD-10-CM

## 2014-01-14 DIAGNOSIS — C19 Malignant neoplasm of rectosigmoid junction: Secondary | ICD-10-CM

## 2014-01-14 DIAGNOSIS — Z5111 Encounter for antineoplastic chemotherapy: Secondary | ICD-10-CM

## 2014-01-14 DIAGNOSIS — T85528D Displacement of other gastrointestinal prosthetic devices, implants and grafts, subsequent encounter: Secondary | ICD-10-CM

## 2014-01-14 DIAGNOSIS — Z800 Family history of malignant neoplasm of digestive organs: Secondary | ICD-10-CM

## 2014-01-14 LAB — CBC WITH DIFFERENTIAL/PLATELET
BASO%: 0.2 % (ref 0.0–2.0)
Basophils Absolute: 0 10*3/uL (ref 0.0–0.1)
EOS ABS: 0.1 10*3/uL (ref 0.0–0.5)
EOS%: 2.6 % (ref 0.0–7.0)
HCT: 34.8 % (ref 34.8–46.6)
HGB: 11.3 g/dL — ABNORMAL LOW (ref 11.6–15.9)
LYMPH%: 25.8 % (ref 14.0–49.7)
MCH: 28.8 pg (ref 25.1–34.0)
MCHC: 32.5 g/dL (ref 31.5–36.0)
MCV: 88.8 fL (ref 79.5–101.0)
MONO#: 0.4 10*3/uL (ref 0.1–0.9)
MONO%: 7.8 % (ref 0.0–14.0)
NEUT#: 2.9 10*3/uL (ref 1.5–6.5)
NEUT%: 63.6 % (ref 38.4–76.8)
Platelets: 302 10*3/uL (ref 145–400)
RBC: 3.92 10*6/uL (ref 3.70–5.45)
RDW: 15.3 % — AB (ref 11.2–14.5)
WBC: 4.6 10*3/uL (ref 3.9–10.3)
lymph#: 1.2 10*3/uL (ref 0.9–3.3)

## 2014-01-14 LAB — COMPREHENSIVE METABOLIC PANEL (CC13)
ALBUMIN: 3.4 g/dL — AB (ref 3.5–5.0)
ALK PHOS: 70 U/L (ref 40–150)
ALT: 11 U/L (ref 0–55)
ANION GAP: 9 meq/L (ref 3–11)
AST: 15 U/L (ref 5–34)
BUN: 12.8 mg/dL (ref 7.0–26.0)
CALCIUM: 12.5 mg/dL — AB (ref 8.4–10.4)
CHLORIDE: 107 meq/L (ref 98–109)
CO2: 23 mEq/L (ref 22–29)
Creatinine: 0.8 mg/dL (ref 0.6–1.1)
Glucose: 73 mg/dl (ref 70–140)
Potassium: 4.5 mEq/L (ref 3.5–5.1)
Sodium: 139 mEq/L (ref 136–145)
Total Bilirubin: <0.2 mg/dL (ref 0.20–1.20)
Total Protein: 8.4 g/dL — ABNORMAL HIGH (ref 6.4–8.3)

## 2014-01-14 LAB — LACTATE DEHYDROGENASE (CC13): LDH: 174 U/L (ref 125–245)

## 2014-01-14 MED ORDER — DEXAMETHASONE SODIUM PHOSPHATE 20 MG/5ML IJ SOLN
INTRAMUSCULAR | Status: AC
  Filled 2014-01-14: qty 5

## 2014-01-14 MED ORDER — SODIUM CHLORIDE 0.9 % IV SOLN
Freq: Once | INTRAVENOUS | Status: AC
  Administered 2014-01-14: 12:00:00 via INTRAVENOUS

## 2014-01-14 MED ORDER — LEUCOVORIN CALCIUM INJECTION 350 MG
400.0000 mg/m2 | Freq: Once | INTRAVENOUS | Status: AC
  Administered 2014-01-14: 700 mg via INTRAVENOUS
  Filled 2014-01-14: qty 35

## 2014-01-14 MED ORDER — FLUOROURACIL CHEMO INJECTION 2.5 GM/50ML
400.0000 mg/m2 | Freq: Once | INTRAVENOUS | Status: AC
Start: 2014-01-14 — End: 2014-01-14
  Administered 2014-01-14: 700 mg via INTRAVENOUS
  Filled 2014-01-14: qty 14

## 2014-01-14 MED ORDER — DEXAMETHASONE SODIUM PHOSPHATE 20 MG/5ML IJ SOLN
20.0000 mg | Freq: Once | INTRAMUSCULAR | Status: AC
  Administered 2014-01-14: 20 mg via INTRAVENOUS

## 2014-01-14 MED ORDER — ATROPINE SULFATE 1 MG/ML IJ SOLN
INTRAMUSCULAR | Status: AC
  Filled 2014-01-14: qty 1

## 2014-01-14 MED ORDER — ONDANSETRON 16 MG/50ML IVPB (CHCC)
16.0000 mg | Freq: Once | INTRAVENOUS | Status: AC
  Administered 2014-01-14: 16 mg via INTRAVENOUS

## 2014-01-14 MED ORDER — ONDANSETRON 16 MG/50ML IVPB (CHCC)
INTRAVENOUS | Status: AC
  Filled 2014-01-14: qty 16

## 2014-01-14 MED ORDER — ATROPINE SULFATE 1 MG/ML IJ SOLN
0.5000 mg | Freq: Once | INTRAMUSCULAR | Status: AC | PRN
  Administered 2014-01-14: 0.5 mg via INTRAVENOUS

## 2014-01-14 MED ORDER — IRINOTECAN HCL CHEMO INJECTION 100 MG/5ML
180.0000 mg/m2 | Freq: Once | INTRAVENOUS | Status: AC
  Administered 2014-01-14: 320 mg via INTRAVENOUS
  Filled 2014-01-14: qty 16

## 2014-01-14 MED ORDER — SODIUM CHLORIDE 0.9 % IV SOLN
2400.0000 mg/m2 | INTRAVENOUS | Status: DC
  Administered 2014-01-14: 4300 mg via INTRAVENOUS
  Filled 2014-01-14: qty 86

## 2014-01-14 NOTE — Telephone Encounter (Signed)
Per staff message and POF I have scheduled appts. Advised scheduler of appts. JMW  

## 2014-01-14 NOTE — Telephone Encounter (Signed)
gv and printed appts ched and avs for pt for July and Aug...sed added tx. °

## 2014-01-14 NOTE — Patient Instructions (Signed)
[facility] Discharge Instructions for Patients Receiving Chemotherapy  Today you received the following chemotherapy agents Irinotecan, Leucovorin, 5FU. To help prevent nausea and vomiting after your treatment, we encourage you to take your nausea medication.   If you develop nausea and vomiting that is not controlled by your nausea medication, call the clinic.   BELOW ARE SYMPTOMS THAT SHOULD BE REPORTED IMMEDIATELY:  *FEVER GREATER THAN 100.5 F  *CHILLS WITH OR WITHOUT FEVER  NAUSEA AND VOMITING THAT IS NOT CONTROLLED WITH YOUR NAUSEA MEDICATION  *UNUSUAL SHORTNESS OF BREATH  *UNUSUAL BRUISING OR BLEEDING  TENDERNESS IN MOUTH AND THROAT WITH OR WITHOUT PRESENCE OF ULCERS  *URINARY PROBLEMS  *BOWEL PROBLEMS  UNUSUAL RASH Items with * indicate a potential emergency and should be followed up as soon as possible.  Feel free to call the clinic you have any questions or concerns. The clinic phone number is [phone].

## 2014-01-14 NOTE — Progress Notes (Signed)
[facility] OFFICE PROGRESS NOTE  [name] Bachmann, MD [place] [address]  DIAGNOSIS: Colorectal cancer, stage IV - Plan: CBC with Differential, Comprehensive metabolic panel (Cmet) - [facility], Lactate dehydrogenase (LDH) - [facility]  Colon obstruction  Migrated colon stent, subsequent encounter  Chief Complaint  Patient presents with  . Colorectal cancer, stage IV    CURRENT TREATMENT: Folfox q 14 weeks (07/09/2013 until 08/20/2013).  Switched to [facility] q 14 days due to mixed response (started on 09/13/2013).      Colorectal cancer, stage IV   05/22/2013 - 05/27/2013 Hospital Admission A/w abdominal distention and constipation for 2 days CT scan of her abdomen and pelvis showed area of bowel wall thickening at the rectosigmoid junction concerning for malignancy.   05/22/2013 Imaging CT of Abdomen: bowel wall thickening with shouldering of the proximal and distal margins, involving therectosigmoid junction, supicous liver spleen mets. Colonsocopy recommended.    05/23/2013 Tumor Marker CEA 225.7   05/24/2013 Imaging MRI of abdomen: 3.7 x 4.4 cm enhancing lesion along the superior spleen, indeterminate but worrisome for metastasis.2.7 x 3.9 cm enhancing lesion in the medial segment left hepatic lobe,    05/25/2013 Pathology Diagnosis Rectum, biopsy, proximal - INVASIVE ADENOCARCINOMA.   05/25/2013 Procedure Flex sig: (Dr. Ardis Hughs). 5-6 cm obstructing rectosigmoid mass distal end 10 cm from anal verge; mass biopsed and then stented 9 cm long, 22 cm diameter uncovered SEM.    05/25/2013 Initial Diagnosis Colorectal cancer, stage IV   06/25/2013 Procedure R port a cath placement.   07/09/2013 - 08/20/2013 Chemotherapy FOLFOX q 2 weeks started.  Not elgible for clinical trial and bevacimab based on stent and high risk for perforation.    07/30/2013 Tumor Marker KRAS positive.  p53, APC identified. Mismatch Repair (MMR) preserved.    08/06/2013 Adverse Reaction Complains of  darkening of her hands with peeling.    08/27/2013 Family History She was seen by Genetic couselor.  She declined testing today, but will call and reschedule an appointment should she decide to pursue testing.   09/07/2013 Imaging CT abdomen: Mixed response to therapy. Response to therapy of hepatic metastasis. right hepatic hemangiomas are again identified. Other too small to characterize liver lesions are felt to be similar but are indeterminate.. Enlargement of a splenic lesions   09/13/2013 Tumor Marker CEA 33.5   09/13/2013 Treatment Plan Change Reviewed scans consistent with mixed response.  Switch to FOLFIRI.  Provided script for immodium.    10/29/2013 Adverse Reaction Patient reports intense abdominal cramping lasting the first few days on chemotherapy.  Starting Hyoscyamine 0.125 mg q 6 hours prn.    11/15/2013 - 11/15/2013 Hospital Admission Patient presented to ED with migrated stent.  Stent retrieved by Dr. Ardis Hughs.  Imaging negative for perforation.   Discussed in conference with referral to Dr. Zella Richer made.    11/23/2013 Imaging CT Chest. 1. Stable left lower lobe 5 mm pulmonary nodule. 2. Interval increase in size splenic mass is concerning formetastatic lesion.3. Interval calcification of the left hepatic lobe metastasis. Noevidence of active residual disease by CT.   12/10/2013 Tumor Marker CEA 8.3    INTERVAL HISTORY: [name] [name] 63 y.o. female with a history of Stage IV colon cancer is here for follow-up.  She was last seen by me on 12/24/2013   Today, she reports her bowel movements are regular and that she is with minimal pain. She denies any fevers or chills. She also reports that her weight is stable with  an adequate appetite. She is very active and handles her basic, intermediate and advanced ADLs without difficulties.  She reports good energy and appetite.  Her chemotherapy was rescheduled from 07/06 till today at her request due to family reunion.   MEDICAL HISTORY: Past  Medical History  Diagnosis Date  . Anxiety   . Colon cancer     dx'd 2014    INTERIM HISTORY: has Rectal mass; Colon obstruction; Hypercalcemia; Colorectal cancer, stage IV; Family history of colorectal cancer; Thrush, oral; Tachycardia; and Migrated colon stent on her problem list.    ALLERGIES:  is allergic to codeine.  MEDICATIONS: has a current medication list which includes the following prescription(s): hyoscyamine, lidocaine-prilocaine, loperamide, lorazepam, ondansetron, polyethylene glycol powder, and prochlorperazine, and the following Facility-Administered Medications: sodium chloride.  SURGICAL HISTORY:  Past Surgical History  Procedure Laterality Date  . Abdominal hysterectomy  2005  . Cesarean section  1976  . Flexible sigmoidoscopy N/A 05/25/2013    Procedure: FLEXIBLE SIGMOIDOSCOPY;  Surgeon: [name], MD;  Location: [facility];  Service: Endoscopy;  Laterality: N/A;  needs floro  . Colonic stent placement N/A 05/25/2013    Procedure: COLONIC STENT PLACEMENT;  Surgeon: [name], MD;  Location: [facility];  Service: Endoscopy;  Laterality: N/A;  . Portacath placement Right 06/25/2013    Procedure: ULTRA SOUND GUIDED INSERTION PORT-A-CATH;  Surgeon: [name], MD;  Location: [facility];  Service: General;  Laterality: Right;    REVIEW OF SYSTEMS:   Constitutional: Denies fevers, chills or abnormal weight loss Eyes: Denies blurriness of vision Ears, nose, mouth, throat, and face: Denies mucositis or sore throat Respiratory: Denies cough, dyspnea or wheezes Cardiovascular: Denies palpitation, chest discomfort or lower extremity swelling Gastrointestinal:  Denies nausea, heartburn or change in bowel habits Skin: Denies abnormal skin rashes Lymphatics: Denies new lymphadenopathy or easy bruising Neurological:Denies numbness, tingling or new weaknesses Behavioral/Psych: Mood is stable, no new changes  All other systems were  reviewed with the patient and are negative.  PHYSICAL EXAMINATION: ECOG PERFORMANCE STATUS: 0 - Asymptomatic  Blood pressure 126/65, pulse 94, temperature 98.2 F (36.8 C), temperature source Oral, resp. rate 20, height 5' 4.5" (1.638 m), weight 152 lb (68.947 kg), SpO2 100.00%.  GENERAL:alert, no distress and comfortable; well-developed, well nourished.  SKIN: skin color, texture, turgor are normal, no rashes or significant lesions; + R Port a cath without tenderness; hyperpigmentation of her hands.  EYES: normal, Conjunctiva are pink and non-injected, sclera clear  OROPHARYNX:no exudate, no erythema and lips, buccal mucosa  NECK: supple, thyroid normal size, non-tender, without nodularity  LYMPH: no palpable lymphadenopathy in the cervical, axillary or supraclavicular  LUNGS: clear to auscultation and percussion with normal breathing effort  HEART: Tachycardic with regular rhythm and no murmurs and no lower extremity edema  ABDOMEN:abdomen soft, non-tender and normal bowel sounds Musculoskeletal:no cyanosis of digits and no clubbing  NEURO: alert & oriented x 3 with fluent speech, no focal motor/sensory deficits  Labs:  Lab Results  Component Value Date   WBC 4.6 01/14/2014   HGB 11.3* 01/14/2014   HCT 34.8 01/14/2014   MCV 88.8 01/14/2014   PLT 302 01/14/2014   NEUTROABS 2.9 01/14/2014      Chemistry      Component Value Date/Time   NA 139 01/14/2014 1003   NA 137 11/15/2013 0615   K 4.5 01/14/2014 1003   K 4.3 11/15/2013 0615   CL 99 11/15/2013 0615   CO2 23 01/14/2014 1003  CO2 24 11/15/2013 0615   BUN 12.8 01/14/2014 1003   BUN 19 11/15/2013 0615   CREATININE 0.8 01/14/2014 1003   CREATININE 0.91 11/15/2013 0615      Component Value Date/Time   CALCIUM 12.5* 01/14/2014 1003   CALCIUM 12.7* 11/15/2013 0615   ALKPHOS 70 01/14/2014 1003   ALKPHOS 89 05/22/2013 0930   AST 15 01/14/2014 1003   AST 26 05/22/2013 0930   ALT 11 01/14/2014 1003   ALT 15 05/22/2013 0930   BILITOT <0.20  01/14/2014 1003   BILITOT 0.2* 05/22/2013 0930       Basic Metabolic Panel:  Recent Labs Lab 01/14/14 1003  NA 139  K 4.5  CO2 23  GLUCOSE 73  BUN 12.8  CREATININE 0.8  CALCIUM 12.5*   GFR Estimated Creatinine Clearance: 71.2 ml/min (by C-G formula based on Cr of 0.8). Liver Function Tests:  Recent Labs Lab 01/14/14 1003  AST 15  ALT 11  ALKPHOS 70  BILITOT <0.20  PROT 8.4*  ALBUMIN 3.4*    CBC:  Recent Labs Lab 01/14/14 1002  WBC 4.6  NEUTROABS 2.9  HGB 11.3*  HCT 34.8  MCV 88.8  PLT 302    Studies:  No results found.   RADIOGRAPHIC STUDIES: None  ASSESSMENT: [name] [name] 63 y.o. female with a history of Colorectal cancer, stage IV - Plan: CBC with Differential, Comprehensive metabolic panel (Cmet) - [facility], Lactate dehydrogenase (LDH) - [facility]  Colon obstruction  Migrated colon stent, subsequent encounter   ASSESSMENT: 63 yo AAF with family history of colon cancer, admitted with obstructive mass s/p stent and biopsy on 11/21 consistent with metastatic colon cancer with mets to liver, spleen. ECOG 0. Elevated CEA.   PLAN:   1. Migrated colorectal stent, s/p retrieval on 05/14 --We discussed her case doing GI conference.  We referred to Dr. Zella Richer for consideration of surgery if indicated and she had an appointment on 06/30.  She declined surgical intervention presently.  She will follow up with GI/Surgery as needed.    2. 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  -- Today, she reports feeling well overall.  We changed her chemotherapy to FOLFIRI in February 2015.   There is no difference in overall response rate, time to progression and overall survival for patients treated with FOLFIRI versus FOLFOX4 in the treatment of advanced colorectal cancer. [facility], 2005, Aug 1; 23(22): 1696-78. For FOLFIRI and FOLFOX respectively, the median to to progression was 7 months versus 7 months;  duration of response was 9 versus 10 months; overall survival was 14 versus 15 months and overall response rates were 31% versus 34%. Other potential therapies if progression is 5-FU/LV, CapeOx or capecitabine. We will continue FOLFORI on today 01/14/2014 q 14 day until progression or unacceptable toxicity.  It consists of the following:   Day # 1 Irinotecan 322 mg for 90 minutes  Leucovorin 400 mg/m2 for 2 hours before 5-FU d1  Fluorouracil 400 mg/m2 once starting 2.5 After treatment start time  Days #1/2 Fluorouracil 2,400 mg/m2 for 46 hours  Day #3 Pump D/C   The reference is from [email or web address]. Al, Irinotecan combined with fluorouracil compared with fluororacil alone as first-line treatment for metastatic colorectal cancer: a multicenter randomised trial.  Lancet 2000 Apr 15; 355 (9381):  In addition, we previously provided an in depth discussion concerning the indications and side-effects of therapy. Side-effects includes but is not limited to bone marrow  suppression (i.e., anemia, thrombocytopenia or leukopenia which can lead to life threatening infections), nausea or vomiting, gastrointestinal effects including diarrhea and electrolyte imbalances, neurotoxicity mainly from oxaliplatinum, liver and/or kidney dysfunction. We will not provide neulasta unless she has febrile neutropenia as risk of febrile neutropenia is less than 5%. We will follow labs prior to chemotherapy. She understood the indications, benefits and risk of therapy and chose to proceed.    --We will obtained a restaging CT (but abdomen and pelvis was not approved) as noted above.  CT of chest overall demonstrates stable disease.  Her CEA from 06/08 was 8.3. We will plan on restaging scans in 1-2 months.     3. Family history of Colon cancer.  --Based on her age and family history (sister deceased at age 74 from colon cancer), referral for genetic testing for Lynch syndrome, i.e. MSI or IHC, would be appropriate. We made a  referral to genetics. Her tumor was negative for lynch syndrome. She was seen on 02/23 but declined further testing for genetic syndromes presently.   4. 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  --Parathyroid hormone elevated 192.5 and parathyroid hormone-related peptide pending. PTH is generally low when hypercalcemia is secondary to malignancy. In primary PTH, it is elevated. We counseled continue aggressive fluid hydration. She was instructed to report to emergency room with worsening constipation or confusion. We referred to endocrinology for further evaluation. She was seen by Dr. Dwyane Dee.  We will give intermittent zometa.     5. Follow-up.  --Patient instructed to follow-up 2 weeks for labs, symptom visit, and consideration of next chemotherapy cycle. We will follow CEA monthly.   All questions were answered. The patient knows to call the clinic with any problems, questions or concerns. We can certainly see the patient much sooner if necessary.  I spent 10 minutes counseling the patient face to face. The total time spent in the appointment was 15 minutes.    Jalacia Mattila, MD 01/14/2014 11:48 AM

## 2014-01-15 LAB — CEA: CEA: 6.4 ng/mL — AB (ref 0.0–5.0)

## 2014-01-16 ENCOUNTER — Ambulatory Visit (HOSPITAL_BASED_OUTPATIENT_CLINIC_OR_DEPARTMENT_OTHER): Payer: Medicaid Other

## 2014-01-16 VITALS — BP 107/61 | HR 100 | Temp 97.1°F

## 2014-01-16 DIAGNOSIS — C7889 Secondary malignant neoplasm of other digestive organs: Secondary | ICD-10-CM

## 2014-01-16 DIAGNOSIS — C787 Secondary malignant neoplasm of liver and intrahepatic bile duct: Secondary | ICD-10-CM

## 2014-01-16 DIAGNOSIS — Z95828 Presence of other vascular implants and grafts: Secondary | ICD-10-CM

## 2014-01-16 DIAGNOSIS — C19 Malignant neoplasm of rectosigmoid junction: Secondary | ICD-10-CM

## 2014-01-16 MED ORDER — SODIUM CHLORIDE 0.9 % IJ SOLN
10.0000 mL | INTRAMUSCULAR | Status: DC | PRN
  Administered 2014-01-16: 10 mL via INTRAVENOUS
  Filled 2014-01-16: qty 10

## 2014-01-16 MED ORDER — HEPARIN SOD (PORK) LOCK FLUSH 100 UNIT/ML IV SOLN
500.0000 [IU] | Freq: Once | INTRAVENOUS | Status: AC
  Administered 2014-01-16: 500 [IU] via INTRAVENOUS
  Filled 2014-01-16: qty 5

## 2014-01-16 NOTE — Progress Notes (Signed)
Patient in for Pump disconnect today. Pump stopped and Port-A-Cath accessed and flushed without any difficulty. Blood return noted during flush. Patient denies any pain or discomfort at this time. Patient discharged from Flush room.

## 2014-01-17 ENCOUNTER — Encounter: Payer: Self-pay | Admitting: Internal Medicine

## 2014-01-17 NOTE — Progress Notes (Signed)
Pt called inquiring about her balance.  I informed her of the amount in self pay and told her if she wanted to pay it she could do that here in the office.  I also informed her of the amount in bad debt but if she wanted more info about that she would have to call billing and I gave her the number to billing.

## 2014-01-21 ENCOUNTER — Inpatient Hospital Stay: Payer: Medicaid Other

## 2014-01-21 ENCOUNTER — Other Ambulatory Visit: Payer: Medicaid Other

## 2014-01-23 ENCOUNTER — Encounter: Payer: Self-pay | Admitting: Internal Medicine

## 2014-01-23 NOTE — Progress Notes (Signed)
Called and gave patient the ph# for billing because she needs for medicaid.

## 2014-01-28 ENCOUNTER — Telehealth: Payer: Self-pay | Admitting: Internal Medicine

## 2014-01-28 ENCOUNTER — Ambulatory Visit (HOSPITAL_BASED_OUTPATIENT_CLINIC_OR_DEPARTMENT_OTHER): Payer: Medicaid Other | Admitting: Internal Medicine

## 2014-01-28 ENCOUNTER — Other Ambulatory Visit (HOSPITAL_BASED_OUTPATIENT_CLINIC_OR_DEPARTMENT_OTHER): Payer: Medicaid Other

## 2014-01-28 ENCOUNTER — Ambulatory Visit (HOSPITAL_BASED_OUTPATIENT_CLINIC_OR_DEPARTMENT_OTHER): Payer: Medicaid Other

## 2014-01-28 VITALS — BP 120/50 | HR 93 | Temp 98.3°F | Resp 18 | Ht 64.0 in | Wt 151.1 lb

## 2014-01-28 DIAGNOSIS — C19 Malignant neoplasm of rectosigmoid junction: Secondary | ICD-10-CM

## 2014-01-28 DIAGNOSIS — C787 Secondary malignant neoplasm of liver and intrahepatic bile duct: Secondary | ICD-10-CM

## 2014-01-28 DIAGNOSIS — C7889 Secondary malignant neoplasm of other digestive organs: Secondary | ICD-10-CM

## 2014-01-28 DIAGNOSIS — T85528D Displacement of other gastrointestinal prosthetic devices, implants and grafts, subsequent encounter: Secondary | ICD-10-CM

## 2014-01-28 DIAGNOSIS — Z5111 Encounter for antineoplastic chemotherapy: Secondary | ICD-10-CM

## 2014-01-28 DIAGNOSIS — K56609 Unspecified intestinal obstruction, unspecified as to partial versus complete obstruction: Secondary | ICD-10-CM

## 2014-01-28 LAB — COMPREHENSIVE METABOLIC PANEL (CC13)
ALT: 10 U/L (ref 0–55)
ANION GAP: 9 meq/L (ref 3–11)
AST: 13 U/L (ref 5–34)
Albumin: 3.2 g/dL — ABNORMAL LOW (ref 3.5–5.0)
Alkaline Phosphatase: 66 U/L (ref 40–150)
BUN: 11.3 mg/dL (ref 7.0–26.0)
CO2: 21 meq/L — AB (ref 22–29)
CREATININE: 0.8 mg/dL (ref 0.6–1.1)
Calcium: 11.4 mg/dL — ABNORMAL HIGH (ref 8.4–10.4)
Chloride: 109 mEq/L (ref 98–109)
GLUCOSE: 88 mg/dL (ref 70–140)
Potassium: 3.8 mEq/L (ref 3.5–5.1)
Sodium: 139 mEq/L (ref 136–145)
Total Protein: 7.5 g/dL (ref 6.4–8.3)

## 2014-01-28 LAB — CBC WITH DIFFERENTIAL/PLATELET
BASO%: 1.2 % (ref 0.0–2.0)
BASOS ABS: 0.1 10*3/uL (ref 0.0–0.1)
EOS ABS: 0.1 10*3/uL (ref 0.0–0.5)
EOS%: 2.3 % (ref 0.0–7.0)
HCT: 32.3 % — ABNORMAL LOW (ref 34.8–46.6)
HGB: 10.6 g/dL — ABNORMAL LOW (ref 11.6–15.9)
LYMPH%: 25.3 % (ref 14.0–49.7)
MCH: 28.9 pg (ref 25.1–34.0)
MCHC: 32.8 g/dL (ref 31.5–36.0)
MCV: 88.2 fL (ref 79.5–101.0)
MONO#: 0.5 10*3/uL (ref 0.1–0.9)
MONO%: 9.4 % (ref 0.0–14.0)
NEUT%: 61.8 % (ref 38.4–76.8)
NEUTROS ABS: 3 10*3/uL (ref 1.5–6.5)
Platelets: 305 10*3/uL (ref 145–400)
RBC: 3.66 10*6/uL — ABNORMAL LOW (ref 3.70–5.45)
RDW: 15.6 % — ABNORMAL HIGH (ref 11.2–14.5)
WBC: 4.9 10*3/uL (ref 3.9–10.3)
lymph#: 1.2 10*3/uL (ref 0.9–3.3)

## 2014-01-28 LAB — LACTATE DEHYDROGENASE (CC13): LDH: 147 U/L (ref 125–245)

## 2014-01-28 MED ORDER — LORAZEPAM 0.5 MG PO TABS: 0.5000 mg | ORAL_TABLET | Freq: Three times a day (TID) | ORAL | Status: DC | PRN

## 2014-01-28 MED ORDER — ATROPINE SULFATE 1 MG/ML IJ SOLN
INTRAMUSCULAR | Status: AC
  Filled 2014-01-28: qty 1

## 2014-01-28 MED ORDER — ATROPINE SULFATE 1 MG/ML IJ SOLN
0.5000 mg | Freq: Once | INTRAMUSCULAR | Status: AC | PRN
Start: 2014-01-28 — End: 2014-01-28
  Administered 2014-01-28: 0.5 mg via INTRAVENOUS

## 2014-01-28 MED ORDER — DEXAMETHASONE SODIUM PHOSPHATE 20 MG/5ML IJ SOLN
INTRAMUSCULAR | Status: AC
  Filled 2014-01-28: qty 5

## 2014-01-28 MED ORDER — LEUCOVORIN CALCIUM INJECTION 350 MG
400.0000 mg/m2 | Freq: Once | INTRAMUSCULAR | Status: AC
  Administered 2014-01-28: 700 mg via INTRAVENOUS
  Filled 2014-01-28: qty 35

## 2014-01-28 MED ORDER — FLUOROURACIL CHEMO INJECTION 2.5 GM/50ML
400.0000 mg/m2 | Freq: Once | INTRAVENOUS | Status: AC
  Administered 2014-01-28: 700 mg via INTRAVENOUS
  Filled 2014-01-28: qty 14

## 2014-01-28 MED ORDER — SODIUM CHLORIDE 0.9 % IV SOLN
2400.0000 mg/m2 | INTRAVENOUS | Status: DC
  Administered 2014-01-28: 4300 mg via INTRAVENOUS
  Filled 2014-01-28: qty 86

## 2014-01-28 MED ORDER — IRINOTECAN HCL CHEMO INJECTION 100 MG/5ML
180.0000 mg/m2 | Freq: Once | INTRAVENOUS | Status: AC
  Administered 2014-01-28: 320 mg via INTRAVENOUS
  Filled 2014-01-28: qty 16

## 2014-01-28 MED ORDER — DEXAMETHASONE SODIUM PHOSPHATE 20 MG/5ML IJ SOLN
20.0000 mg | Freq: Once | INTRAMUSCULAR | Status: AC
  Administered 2014-01-28: 20 mg via INTRAVENOUS

## 2014-01-28 MED ORDER — ONDANSETRON 16 MG/50ML IVPB (CHCC)
INTRAVENOUS | Status: AC
  Filled 2014-01-28: qty 16

## 2014-01-28 MED ORDER — ONDANSETRON 16 MG/50ML IVPB (CHCC)
16.0000 mg | Freq: Once | INTRAVENOUS | Status: AC
  Administered 2014-01-28: 16 mg via INTRAVENOUS

## 2014-01-28 MED ORDER — SODIUM CHLORIDE 0.9 % IV SOLN
Freq: Once | INTRAVENOUS | Status: AC
  Administered 2014-01-28: 10:00:00 via INTRAVENOUS

## 2014-01-28 NOTE — Progress Notes (Signed)
Correction: She will continue FOLFORI on today 01/28/2014.

## 2014-01-28 NOTE — Progress Notes (Signed)
[facility] OFFICE PROGRESS NOTE  Tadd Holtmeyer, MD [place] [address]  DIAGNOSIS: Colorectal cancer, stage IV - Plan: CBC with Differential, Comprehensive metabolic panel (Cmet) - [facility], CEA, CT Abdomen W Contrast  Colon obstruction  Migrated colon stent, subsequent encounter  Malignant neoplasm of rectosigmoid junction - Plan: LORazepam (ATIVAN) 0.5 MG tablet  Chief Complaint  Patient presents with  . Colorectal cancer, stage IV    CURRENT TREATMENT: Folfox q 14 weeks (07/09/2013 until 08/20/2013).  Switched to [facility] q 14 days due to mixed response (started on 09/13/2013).      Colorectal cancer, stage IV   05/22/2013 - 05/27/2013 Hospital Admission A/w abdominal distention and constipation for 2 days CT scan of her abdomen and pelvis showed area of bowel wall thickening at the rectosigmoid junction concerning for malignancy.   05/22/2013 Imaging CT of Abdomen: bowel wall thickening with shouldering of the proximal and distal margins, involving therectosigmoid junction, supicous liver spleen mets. Colonsocopy recommended.    05/23/2013 Tumor Marker CEA 225.7   05/24/2013 Imaging MRI of abdomen: 3.7 x 4.4 cm enhancing lesion along the superior spleen, indeterminate but worrisome for metastasis.2.7 x 3.9 cm enhancing lesion in the medial segment left hepatic lobe,    05/25/2013 Pathology Results Diagnosis Rectum, biopsy, proximal - INVASIVE ADENOCARCINOMA.   05/25/2013 Procedure Flex sig: (Dr. Ardis Hughs). 5-6 cm obstructing rectosigmoid mass distal end 10 cm from anal verge; mass biopsed and then stented 9 cm long, 22 cm diameter uncovered SEM.    05/25/2013 Initial Diagnosis Colorectal cancer, stage IV   06/25/2013 Procedure R port a cath placement.   07/09/2013 - 08/20/2013 Chemotherapy FOLFOX q 2 weeks started.  Not elgible for clinical trial and bevacimab based on stent and high risk for perforation.    07/30/2013 Tumor Marker KRAS positive.  p53, APC  identified. Mismatch Repair (MMR) preserved.    08/06/2013 Adverse Reaction Complains of darkening of her hands with peeling.    08/27/2013 Family History She was seen by Genetic couselor.  She declined testing today, but will call and reschedule an appointment should she decide to pursue testing.   09/07/2013 Imaging CT abdomen: Mixed response to therapy. Response to therapy of hepatic metastasis. right hepatic hemangiomas are again identified. Other too small to characterize liver lesions are felt to be similar but are indeterminate.. Enlargement of a splenic lesions   09/13/2013 Tumor Marker CEA 33.5   09/13/2013 Treatment Plan Change Reviewed scans consistent with mixed response.  Switch to FOLFIRI.  Provided script for immodium.    10/29/2013 Adverse Reaction Patient reports intense abdominal cramping lasting the first few days on chemotherapy.  Starting Hyoscyamine 0.125 mg q 6 hours prn.    11/15/2013 - 11/15/2013 Hospital Admission Patient presented to ED with migrated stent.  Stent retrieved by Dr. Ardis Hughs.  Imaging negative for perforation.   Discussed in conference with referral to Dr. Zella Richer made.    11/23/2013 Imaging CT Chest. 1. Stable left lower lobe 5 mm pulmonary nodule. 2. Interval increase in size splenic mass is concerning formetastatic lesion.3. Interval calcification of the left hepatic lobe metastasis. Noevidence of active residual disease by CT.   12/10/2013 Tumor Marker CEA 8.3   01/14/2014 Tumor Marker CEA, 6.4    INTERVAL HISTORY: [name] [name] 63 y.o. female with a history of Stage IV colon cancer is here for follow-up.  She was last seen by me on 01/14/2014   Today, she reports her bowel movements are regular and  that she is with minimal pain. She denies any fevers or chills. She also reports that her weight is stable with an adequate appetite. She is very active and handles her basic, intermediate and advanced ADLs without difficulties.  She reports good energy and appetite.  She  is planning a family vacation and September, 2015.   MEDICAL HISTORY: Past Medical History  Diagnosis Date  . Anxiety   . Colon cancer     dx'd 2014    INTERIM HISTORY: has Rectal mass; Colon obstruction; Hypercalcemia; Colorectal cancer, stage IV; Family history of colorectal cancer; Thrush, oral; Tachycardia; and Migrated colon stent on her problem list.    ALLERGIES:  is allergic to codeine.  MEDICATIONS: has a current medication list which includes the following prescription(s): dexamethasone, hyoscyamine, lidocaine-prilocaine, loperamide, lorazepam, ondansetron, polyethylene glycol powder, and prochlorperazine, and the following Facility-Administered Medications: sodium chloride.  SURGICAL HISTORY:  Past Surgical History  Procedure Laterality Date  . Abdominal hysterectomy  2005  . Cesarean section  1976  . Flexible sigmoidoscopy N/A 05/25/2013    Procedure: FLEXIBLE SIGMOIDOSCOPY;  Surgeon: [name], MD;  Location: [facility];  Service: Endoscopy;  Laterality: N/A;  needs floro  . Colonic stent placement N/A 05/25/2013    Procedure: COLONIC STENT PLACEMENT;  Surgeon: [name], MD;  Location: [facility];  Service: Endoscopy;  Laterality: N/A;  . Portacath placement Right 06/25/2013    Procedure: ULTRA SOUND GUIDED INSERTION PORT-A-CATH;  Surgeon: [name], MD;  Location: [facility];  Service: General;  Laterality: Right;    REVIEW OF SYSTEMS:   Constitutional: Denies fevers, chills or abnormal weight loss Eyes: Denies blurriness of vision Ears, nose, mouth, throat, and face: Denies mucositis or sore throat Respiratory: Denies cough, dyspnea or wheezes Cardiovascular: Denies palpitation, chest discomfort or lower extremity swelling Gastrointestinal:  Denies nausea, heartburn or change in bowel habits Skin: Denies abnormal skin rashes Lymphatics: Denies new lymphadenopathy or easy bruising Neurological:Denies numbness, tingling or new  weaknesses Behavioral/Psych: Mood is stable, no new changes  All other systems were reviewed with the patient and are negative.  PHYSICAL EXAMINATION: ECOG PERFORMANCE STATUS: 0 - Asymptomatic  Blood pressure 120/50, pulse 93, temperature 98.3 F (36.8 C), temperature source Oral, resp. rate 18, height '5\' 4"'  (1.626 m), weight 151 lb 1.6 oz (68.539 kg), SpO2 100.00%.  GENERAL:alert, no distress and comfortable; well-developed, well nourished.  SKIN: skin color, texture, turgor are normal, no rashes or significant lesions; + R Port a cath without tenderness; hyperpigmentation of her hands.  EYES: normal, Conjunctiva are pink and non-injected, sclera clear  OROPHARYNX:no exudate, no erythema and lips, buccal mucosa  NECK: supple, thyroid normal size, non-tender, without nodularity  LYMPH: no palpable lymphadenopathy in the cervical, axillary or supraclavicular  LUNGS: clear to auscultation and percussion with normal breathing effort  HEART: Tachycardic with regular rhythm and no murmurs and no lower extremity edema  ABDOMEN:abdomen soft, non-tender and normal bowel sounds Musculoskeletal:no cyanosis of digits and no clubbing  NEURO: alert & oriented x 3 with fluent speech, no focal motor/sensory deficits  Labs:  Lab Results  Component Value Date   WBC 4.9 01/28/2014   HGB 10.6* 01/28/2014   HCT 32.3* 01/28/2014   MCV 88.2 01/28/2014   PLT 305 01/28/2014   NEUTROABS 3.0 01/28/2014      Chemistry      Component Value Date/Time   NA 139 01/28/2014 0832   NA 137 11/15/2013 0615   K 3.8 01/28/2014 2993  K 4.3 11/15/2013 0615   CL 99 11/15/2013 0615   CO2 21* 01/28/2014 0832   CO2 24 11/15/2013 0615   BUN 11.3 01/28/2014 0832   BUN 19 11/15/2013 0615   CREATININE 0.8 01/28/2014 0832   CREATININE 0.91 11/15/2013 0615      Component Value Date/Time   CALCIUM 11.4* 01/28/2014 0832   CALCIUM 12.7* 11/15/2013 0615   ALKPHOS 66 01/28/2014 0832   ALKPHOS 89 05/22/2013 0930   AST 13 01/28/2014 0832    AST 26 05/22/2013 0930   ALT 10 01/28/2014 0832   ALT 15 05/22/2013 0930   BILITOT <0.20 01/28/2014 0832   BILITOT 0.2* 05/22/2013 0930       Basic Metabolic Panel:  Recent Labs Lab 01/28/14 0832  NA 139  K 3.8  CO2 21*  GLUCOSE 88  BUN 11.3  CREATININE 0.8  CALCIUM 11.4*   GFR Estimated Creatinine Clearance: 70.2 ml/min (by C-G formula based on Cr of 0.8). Liver Function Tests:  Recent Labs Lab 01/28/14 0832  AST 13  ALT 10  ALKPHOS 66  BILITOT <0.20  PROT 7.5  ALBUMIN 3.2*    CBC:  Recent Labs Lab 01/28/14 0832  WBC 4.9  NEUTROABS 3.0  HGB 10.6*  HCT 32.3*  MCV 88.2  PLT 305    Studies:  No results found.   RADIOGRAPHIC STUDIES: None  ASSESSMENT: Park Breed 63 y.o. female with a history of Colorectal cancer, stage IV - Plan: CBC with Differential, Comprehensive metabolic panel (Cmet) - [facility], CEA, CT Abdomen W Contrast  Colon obstruction  Migrated colon stent, subsequent encounter  Malignant neoplasm of rectosigmoid junction - Plan: LORazepam (ATIVAN) 0.5 MG tablet   ASSESSMENT: 63 yo AAF with family history of colon cancer, admitted with obstructive mass s/p stent and biopsy on 11/21 consistent with metastatic colon cancer with mets to liver, spleen. ECOG 0. Elevated CEA.   PLAN:   1. Migrated colorectal stent, s/p retrieval on 05/14 --We discussed her case doing GI conference.  We referred to Dr. Zella Richer for consideration of surgery if indicated and she had an appointment on 06/30.  She declined surgical intervention presently.  She will follow up with GI/Surgery as needed.    2. 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  -- Today, she reports feeling well overall.  We changed her chemotherapy to FOLFIRI in February 2015.   There is no difference in overall response rate, time to progression and overall survival for patients treated with FOLFIRI versus FOLFOX4 in the treatment of  advanced colorectal cancer. [facility], 2005, Aug 1; 23(22): 0973-53. For FOLFIRI and FOLFOX respectively, the median to to progression was 7 months versus 7 months; duration of response was 9 versus 10 months; overall survival was 14 versus 15 months and overall response rates were 31% versus 34%. Other potential therapies if progression is 5-FU/LV, CapeOx or capecitabine. We will continue FOLFORI on today 01/14/2014 q 14 day until progression or unacceptable toxicity.  It consists of the following:   Day # 1 Irinotecan 322 mg for 90 minutes  Leucovorin 400 mg/m2 for 2 hours before 5-FU d1  Fluorouracil 400 mg/m2 once starting 2.5 After treatment start time  Days #1/2 Fluorouracil 2,400 mg/m2 for 46 hours  Day #3 Pump D/C   The reference is from [place]. Al, Irinotecan combined with fluorouracil compared with fluororacil alone as first-line treatment for metastatic colorectal cancer: a multicenter randomised trial.  Elmore Guise  2000 Apr 15; 355 (9212):  In addition, we previously provided an in depth discussion concerning the indications and side-effects of therapy. Side-effects includes but is not limited to bone marrow suppression (i.e., anemia, thrombocytopenia or leukopenia which can lead to life threatening infections), nausea or vomiting, gastrointestinal effects including diarrhea and electrolyte imbalances, neurotoxicity mainly from oxaliplatinum, liver and/or kidney dysfunction. We will not provide neulasta unless she has febrile neutropenia as risk of febrile neutropenia is less than 5%. We will follow labs prior to chemotherapy. She understood the indications, benefits and risk of therapy and chose to proceed.    --We will obtained a restaging CT abdomen on 02/08/2014.     Her CEA from 07/13 was 6.4.   3. Family history of Colon cancer.  --Based on her age and family history (sister deceased at age 52 from colon cancer), referral for genetic testing for Lynch  syndrome, i.e. MSI or IHC, would be appropriate. We made a referral to genetics. Her tumor was negative for lynch syndrome. She was seen on 02/23 but declined further testing for genetic syndromes presently.   4. 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  --Parathyroid hormone elevated 192.5 and parathyroid hormone-related peptide pending. PTH is generally low when hypercalcemia is secondary to malignancy. In primary PTH, it is elevated. We counseled continue aggressive fluid hydration. She was instructed to report to emergency room with worsening constipation or confusion. We referred to endocrinology for further evaluation. She was seen by Dr. Dwyane Dee.  We will give intermittent zometa.     5. Follow-up.  --Patient instructed to follow-up 2 weeks for labs, symptom visit, and consideration of next chemotherapy cycle. We will follow CEA monthly.   All questions were answered. The patient knows to call the clinic with any problems, questions or concerns. We can certainly see the patient much sooner if necessary.  I spent 10 minutes counseling the patient face to face. The total time spent in the appointment was 15 minutes.    Orvin Netter, MD 01/28/2014 10:23 AM

## 2014-01-28 NOTE — Patient Instructions (Signed)
[facility] Discharge Instructions for Patients Receiving Chemotherapy  Today you received the following chemotherapy agents 5 FU/Leucovorin/Irinotecan To help prevent nausea and vomiting after your treatment, we encourage you to take your nausea medication as prescribed.  If you develop nausea and vomiting that is not controlled by your nausea medication, call the clinic.   BELOW ARE SYMPTOMS THAT SHOULD BE REPORTED IMMEDIATELY:  *FEVER GREATER THAN 100.5 F  *CHILLS WITH OR WITHOUT FEVER  NAUSEA AND VOMITING THAT IS NOT CONTROLLED WITH YOUR NAUSEA MEDICATION  *UNUSUAL SHORTNESS OF BREATH  *UNUSUAL BRUISING OR BLEEDING  TENDERNESS IN MOUTH AND THROAT WITH OR WITHOUT PRESENCE OF ULCERS  *URINARY PROBLEMS  *BOWEL PROBLEMS  UNUSUAL RASH Items with * indicate a potential emergency and should be followed up as soon as possible.  Feel free to call the clinic you have any questions or concerns. The clinic phone number is [phone].    

## 2014-01-28 NOTE — Telephone Encounter (Signed)
[facility] APPT SCHEDULE FOR JULY/AUG. [facility] WILL CALL W/CT.

## 2014-01-30 ENCOUNTER — Ambulatory Visit (HOSPITAL_BASED_OUTPATIENT_CLINIC_OR_DEPARTMENT_OTHER): Payer: Medicaid Other

## 2014-01-30 ENCOUNTER — Other Ambulatory Visit: Payer: Self-pay | Admitting: Internal Medicine

## 2014-01-30 ENCOUNTER — Telehealth: Payer: Self-pay | Admitting: Internal Medicine

## 2014-01-30 DIAGNOSIS — C19 Malignant neoplasm of rectosigmoid junction: Secondary | ICD-10-CM

## 2014-01-30 MED ORDER — HEPARIN SOD (PORK) LOCK FLUSH 100 UNIT/ML IV SOLN
500.0000 [IU] | Freq: Once | INTRAVENOUS | Status: AC | PRN
  Administered 2014-01-30: 500 [IU]
  Filled 2014-01-30: qty 5

## 2014-01-30 MED ORDER — SODIUM CHLORIDE 0.9 % IJ SOLN
10.0000 mL | INTRAMUSCULAR | Status: DC | PRN
  Administered 2014-01-30: 10 mL
  Filled 2014-01-30: qty 10

## 2014-01-30 NOTE — Telephone Encounter (Signed)
, °

## 2014-02-05 ENCOUNTER — Telehealth: Payer: Self-pay

## 2014-02-05 NOTE — Telephone Encounter (Signed)
Pt called stating she has a letter from [facility] that says she needs approval for her scan on Fri. This message passed to [employer] for [place].

## 2014-02-06 ENCOUNTER — Other Ambulatory Visit: Payer: Self-pay | Admitting: *Deleted

## 2014-02-06 ENCOUNTER — Telehealth: Payer: Self-pay | Admitting: *Deleted

## 2014-02-06 NOTE — Telephone Encounter (Signed)
Received call from pt stating that pt had received a letter from [facility] informing pt that her upcoming CT scan has not been approved yet.  Pt would like to know the status of scan approval from our office prior to pt coming to radiology on 02/08/14.  Message left for Benedetto Goad for follow up. Pt's  Phone   [phone].

## 2014-02-06 NOTE — Telephone Encounter (Signed)
Spoke with pt and informed pt re:  On call md Dr. Alvy Bimler reviewed pt's  CEA results 01/14/14.  Per Dr. Alvy Bimler, pt can proceed with chemo as planned for 02/11/14 after lab and office visit with Burnetta Sabin, PA  Depending on lab results that day.  MD also noticed that pt's Calcium level had slightly increased.  Pt to have CMET checked at next office visit on 8/10.  Instructed pt to increase po fluid intake as much as tolerated as per md's instructions. Informed pt that Dr. Juliann Mule will be notified of pt's denial of CT scan when md returns to office on 02/13/14.  Pt voiced understanding.

## 2014-02-06 NOTE — Telephone Encounter (Signed)
Was informed by Benedetto Goad, pre-cert personal that Medicare has denied CT scan for pt since 01/29/14.   Message will be left on md's desk for further instructions when md returns to office.

## 2014-02-07 ENCOUNTER — Encounter: Payer: Self-pay | Admitting: Physician Assistant

## 2014-02-07 NOTE — Progress Notes (Unsigned)
02/07/2014 I have spoken to Ms. Rodenberg today concerning the possible rescheduling of her ABD ct Scan.  This procedure has been DENIED twice by [employer]. I have submitted a new case for Medical Reconsideration.  The turn around time for this one business day.  I will call the patient in the AM with the decision by the Medical Review with her insurance company by 8:00 AM>  Patient voiced understanding of this plan.  She is having a doctor's visit, labs and chemo on Monday,   Stanford Breed Pre-certificate Coordinator [phone]

## 2014-02-08 ENCOUNTER — Other Ambulatory Visit: Payer: Self-pay | Admitting: Physician Assistant

## 2014-02-08 ENCOUNTER — Ambulatory Visit (HOSPITAL_COMMUNITY): Payer: Medicaid Other

## 2014-02-08 ENCOUNTER — Encounter: Payer: Self-pay | Admitting: Physician Assistant

## 2014-02-08 NOTE — Progress Notes (Signed)
02/08/2014 I spoke to [name] [name] this morning concerning her abd ct scan scheduled for today.  This procedure has been denied for the third time.  The patient vocied understanding and will await a call from me today concerning the next step in this denial process.  Stanford Breed [phone]

## 2014-02-11 ENCOUNTER — Other Ambulatory Visit (HOSPITAL_BASED_OUTPATIENT_CLINIC_OR_DEPARTMENT_OTHER): Payer: Medicaid Other

## 2014-02-11 ENCOUNTER — Encounter: Payer: Self-pay | Admitting: Physician Assistant

## 2014-02-11 ENCOUNTER — Ambulatory Visit: Payer: Medicaid Other

## 2014-02-11 ENCOUNTER — Ambulatory Visit (HOSPITAL_BASED_OUTPATIENT_CLINIC_OR_DEPARTMENT_OTHER): Payer: Medicaid Other

## 2014-02-11 ENCOUNTER — Other Ambulatory Visit: Payer: Medicaid Other

## 2014-02-11 ENCOUNTER — Telehealth: Payer: Self-pay | Admitting: Physician Assistant

## 2014-02-11 ENCOUNTER — Ambulatory Visit (HOSPITAL_BASED_OUTPATIENT_CLINIC_OR_DEPARTMENT_OTHER): Payer: Medicaid Other | Admitting: Physician Assistant

## 2014-02-11 ENCOUNTER — Other Ambulatory Visit: Payer: Self-pay | Admitting: Oncology

## 2014-02-11 VITALS — BP 130/71 | HR 82 | Temp 98.4°F | Resp 18 | Ht 64.0 in | Wt 152.3 lb

## 2014-02-11 DIAGNOSIS — C19 Malignant neoplasm of rectosigmoid junction: Secondary | ICD-10-CM

## 2014-02-11 DIAGNOSIS — C7889 Secondary malignant neoplasm of other digestive organs: Secondary | ICD-10-CM

## 2014-02-11 DIAGNOSIS — C787 Secondary malignant neoplasm of liver and intrahepatic bile duct: Secondary | ICD-10-CM

## 2014-02-11 DIAGNOSIS — Z800 Family history of malignant neoplasm of digestive organs: Secondary | ICD-10-CM

## 2014-02-11 DIAGNOSIS — Z5111 Encounter for antineoplastic chemotherapy: Secondary | ICD-10-CM

## 2014-02-11 LAB — CBC WITH DIFFERENTIAL/PLATELET
BASO%: 0.8 % (ref 0.0–2.0)
Basophils Absolute: 0 10*3/uL (ref 0.0–0.1)
EOS%: 2.4 % (ref 0.0–7.0)
Eosinophils Absolute: 0.1 10*3/uL (ref 0.0–0.5)
HCT: 32.2 % — ABNORMAL LOW (ref 34.8–46.6)
HEMOGLOBIN: 10.3 g/dL — AB (ref 11.6–15.9)
LYMPH%: 19.5 % (ref 14.0–49.7)
MCH: 28.5 pg (ref 25.1–34.0)
MCHC: 32.1 g/dL (ref 31.5–36.0)
MCV: 89 fL (ref 79.5–101.0)
MONO#: 0.4 10*3/uL (ref 0.1–0.9)
MONO%: 8.7 % (ref 0.0–14.0)
NEUT#: 3.5 10*3/uL (ref 1.5–6.5)
NEUT%: 68.6 % (ref 38.4–76.8)
PLATELETS: 287 10*3/uL (ref 145–400)
RBC: 3.62 10*6/uL — ABNORMAL LOW (ref 3.70–5.45)
RDW: 15.7 % — ABNORMAL HIGH (ref 11.2–14.5)
WBC: 5.1 10*3/uL (ref 3.9–10.3)
lymph#: 1 10*3/uL (ref 0.9–3.3)

## 2014-02-11 LAB — CEA: CEA: 5 ng/mL (ref 0.0–5.0)

## 2014-02-11 LAB — COMPREHENSIVE METABOLIC PANEL
ALK PHOS: 68 U/L (ref 39–117)
ALT: 8 U/L (ref 0–35)
AST: 13 U/L (ref 0–37)
Albumin: 3.2 g/dL — ABNORMAL LOW (ref 3.5–5.2)
BUN: 13 mg/dL (ref 6–23)
CO2: 24 mEq/L (ref 19–32)
Calcium: 11.7 mg/dL — ABNORMAL HIGH (ref 8.4–10.5)
Chloride: 104 mEq/L (ref 96–112)
Creatinine, Ser: 0.75 mg/dL (ref 0.50–1.10)
GLUCOSE: 92 mg/dL (ref 70–99)
Potassium: 4.6 mEq/L (ref 3.5–5.3)
Sodium: 139 mEq/L (ref 135–145)
Total Protein: 7.5 g/dL (ref 6.0–8.3)

## 2014-02-11 MED ORDER — DEXAMETHASONE SODIUM PHOSPHATE 20 MG/5ML IJ SOLN
20.0000 mg | Freq: Once | INTRAMUSCULAR | Status: AC
  Administered 2014-02-11: 20 mg via INTRAVENOUS

## 2014-02-11 MED ORDER — IRINOTECAN HCL CHEMO INJECTION 100 MG/5ML
180.0000 mg/m2 | Freq: Once | INTRAVENOUS | Status: AC
  Administered 2014-02-11: 320 mg via INTRAVENOUS
  Filled 2014-02-11: qty 16

## 2014-02-11 MED ORDER — FLUOROURACIL CHEMO INJECTION 2.5 GM/50ML
400.0000 mg/m2 | Freq: Once | INTRAVENOUS | Status: AC
  Administered 2014-02-11: 700 mg via INTRAVENOUS
  Filled 2014-02-11: qty 14

## 2014-02-11 MED ORDER — FLUOROURACIL CHEMO INJECTION 5 GM/100ML
2400.0000 mg/m2 | INTRAVENOUS | Status: DC
  Administered 2014-02-11: 4300 mg via INTRAVENOUS
  Filled 2014-02-11: qty 86

## 2014-02-11 MED ORDER — LEUCOVORIN CALCIUM INJECTION 350 MG
400.0000 mg/m2 | Freq: Once | INTRAVENOUS | Status: AC
  Administered 2014-02-11: 700 mg via INTRAVENOUS
  Filled 2014-02-11: qty 35

## 2014-02-11 MED ORDER — ATROPINE SULFATE 1 MG/ML IJ SOLN
0.5000 mg | Freq: Once | INTRAMUSCULAR | Status: AC | PRN
  Administered 2014-02-11: 0.5 mg via INTRAVENOUS

## 2014-02-11 MED ORDER — ATROPINE SULFATE 1 MG/ML IJ SOLN
INTRAMUSCULAR | Status: AC
  Filled 2014-02-11: qty 1

## 2014-02-11 MED ORDER — DEXAMETHASONE SODIUM PHOSPHATE 20 MG/5ML IJ SOLN
INTRAMUSCULAR | Status: AC
Start: 2014-02-11 — End: 2014-02-11
  Filled 2014-02-11: qty 5

## 2014-02-11 MED ORDER — ONDANSETRON 16 MG/50ML IVPB (CHCC)
INTRAVENOUS | Status: AC
  Filled 2014-02-11: qty 16

## 2014-02-11 MED ORDER — SODIUM CHLORIDE 0.9 % IV SOLN
Freq: Once | INTRAVENOUS | Status: AC
  Administered 2014-02-11: 10:00:00 via INTRAVENOUS

## 2014-02-11 MED ORDER — ONDANSETRON 16 MG/50ML IVPB (CHCC)
16.0000 mg | Freq: Once | INTRAVENOUS | Status: AC
  Administered 2014-02-11: 16 mg via INTRAVENOUS

## 2014-02-11 NOTE — Patient Instructions (Signed)
[facility] Discharge Instructions for Patients Receiving Chemotherapy  Today you received the following chemotherapy agents Irinotecan, leucovorin and 5FU.  To help prevent nausea and vomiting after your treatment, we encourage you to take your nausea medication as prescribed.   If you develop nausea and vomiting that is not controlled by your nausea medication, call the clinic.   BELOW ARE SYMPTOMS THAT SHOULD BE REPORTED IMMEDIATELY:  *FEVER GREATER THAN 100.5 F  *CHILLS WITH OR WITHOUT FEVER  NAUSEA AND VOMITING THAT IS NOT CONTROLLED WITH YOUR NAUSEA MEDICATION  *UNUSUAL SHORTNESS OF BREATH  *UNUSUAL BRUISING OR BLEEDING  TENDERNESS IN MOUTH AND THROAT WITH OR WITHOUT PRESENCE OF ULCERS  *URINARY PROBLEMS  *BOWEL PROBLEMS  UNUSUAL RASH Items with * indicate a potential emergency and should be followed up as soon as possible.  Feel free to call the clinic you have any questions or concerns. The clinic phone number is [phone].

## 2014-02-11 NOTE — Progress Notes (Signed)
Notified Awilda Metro PA-C pt complaining of nausea. No new orders received, pt to take anti-emetics once she arrives at home. Pt verbalized understanding of instructions.

## 2014-02-11 NOTE — Telephone Encounter (Signed)
Pt confirmed labs/ov per 08/10 POF, gave pt AVS..Marland KitchenKJ

## 2014-02-11 NOTE — Progress Notes (Signed)
[facility] OFFICE PROGRESS NOTE  [name], [name], MD [place] [address]  DIAGNOSIS: Colorectal cancer, stage IV - Plan: CBC with Differential, Comprehensive metabolic panel (Cmet) - [facility], [facility]  No chief complaint on file.   CURRENT TREATMENT: Folfox q 14 weeks (07/09/2013 until 08/20/2013).  Switched to [facility] q 14 days due to mixed response (started on 09/13/2013).      Colorectal cancer, stage IV   05/22/2013 - 05/27/2013 Hospital Admission A/w abdominal distention and constipation for 2 days CT scan of her abdomen and pelvis showed area of bowel wall thickening at the rectosigmoid junction concerning for malignancy.   05/22/2013 Imaging CT of Abdomen: bowel wall thickening with shouldering of the proximal and distal margins, involving therectosigmoid junction, supicous liver spleen mets. Colonsocopy recommended.    05/23/2013 Tumor Marker CEA 225.7   05/24/2013 Imaging MRI of abdomen: 3.7 x 4.4 cm enhancing lesion along the superior spleen, indeterminate but worrisome for metastasis.2.7 x 3.9 cm enhancing lesion in the medial segment left hepatic lobe,    05/25/2013 Pathology Results Diagnosis Rectum, biopsy, proximal - INVASIVE ADENOCARCINOMA.   05/25/2013 Procedure Flex sig: (Dr. Ardis Hughs). 5-6 cm obstructing rectosigmoid mass distal end 10 cm from anal verge; mass biopsed and then stented 9 cm long, 22 cm diameter uncovered SEM.    05/25/2013 Initial Diagnosis Colorectal cancer, stage IV   06/25/2013 Procedure R port a cath placement.   07/09/2013 - 08/20/2013 Chemotherapy FOLFOX q 2 weeks started.  Not elgible for clinical trial and bevacimab based on stent and high risk for perforation.    07/30/2013 Tumor Marker KRAS positive.  p53, APC identified. Mismatch Repair (MMR) preserved.    08/06/2013 Adverse Reaction Complains of darkening of her hands with peeling.    08/27/2013 Family History She was seen by Genetic couselor.  She declined testing today, but will call  and reschedule an appointment should she decide to pursue testing.   09/07/2013 Imaging CT abdomen: Mixed response to therapy. Response to therapy of hepatic metastasis. right hepatic hemangiomas are again identified. Other too small to characterize liver lesions are felt to be similar but are indeterminate.. Enlargement of a splenic lesions   09/13/2013 Tumor Marker CEA 33.5   09/13/2013 Treatment Plan Change Reviewed scans consistent with mixed response.  Switch to FOLFIRI.  Provided script for immodium.    10/29/2013 Adverse Reaction Patient reports intense abdominal cramping lasting the first few days on chemotherapy.  Starting Hyoscyamine 0.125 mg q 6 hours prn.    11/15/2013 - 11/15/2013 Hospital Admission Patient presented to ED with migrated stent.  Stent retrieved by Dr. Ardis Hughs.  Imaging negative for perforation.   Discussed in conference with referral to Dr. Zella Richer made.    11/23/2013 Imaging CT Chest. 1. Stable left lower lobe 5 mm pulmonary nodule. 2. Interval increase in size splenic mass is concerning formetastatic lesion.3. Interval calcification of the left hepatic lobe metastasis. Noevidence of active residual disease by CT.   12/10/2013 Tumor Marker CEA 8.3   01/14/2014 Tumor Marker CEA, 6.4    INTERVAL HISTORY: [name] [name] 63 y.o. female with a history of Stage IV colon cancer is here for follow-up.  She was last seen by Dr. Juliann Mule on 01/28/2014  Overall she is tolerating tolerating her chemotherapy relatively well. She does complain of some left shoulder pain that has been present for approximately 2 weeks. She presents to proceed with her next scheduled cycle of chemotherapy.  She denies any fevers or chills.  She denies any problems with diarrhea or constipation. She also reports that her weight is stable with an adequate appetite. She is very active and handles her basic, intermediate and advanced ADLs without difficulties.  She reports good energy.   MEDICAL HISTORY: Past Medical  History  Diagnosis Date  . Anxiety   . Colon cancer     dx'd 2014    INTERIM HISTORY: has Rectal mass; Colon obstruction; Hypercalcemia; Colorectal cancer, stage IV; Family history of colorectal cancer; Thrush, oral; Tachycardia; and Migrated colon stent on her problem list.    ALLERGIES:  is allergic to codeine.  MEDICATIONS: has a current medication list which includes the following prescription(s): dexamethasone, hyoscyamine, lidocaine-prilocaine, lorazepam, ondansetron, prochlorperazine, loperamide, and polyethylene glycol powder, and the following Facility-Administered Medications: sodium chloride.  SURGICAL HISTORY:  Past Surgical History  Procedure Laterality Date  . Abdominal hysterectomy  2005  . Cesarean section  1976  . Flexible sigmoidoscopy N/A 05/25/2013    Procedure: FLEXIBLE SIGMOIDOSCOPY;  Surgeon: [name], MD;  Location: [facility];  Service: Endoscopy;  Laterality: N/A;  needs floro  . Colonic stent placement N/A 05/25/2013    Procedure: COLONIC STENT PLACEMENT;  Surgeon: [name], MD;  Location: [facility];  Service: Endoscopy;  Laterality: N/A;  . Portacath placement Right 06/25/2013    Procedure: ULTRA SOUND GUIDED INSERTION PORT-A-CATH;  Surgeon: [name], MD;  Location: [facility];  Service: General;  Laterality: Right;    REVIEW OF SYSTEMS:   Constitutional: Denies fevers, chills or abnormal weight loss Eyes: Denies blurriness of vision Ears, nose, mouth, throat, and face: Denies mucositis or sore throat Respiratory: Denies cough, dyspnea or wheezes Cardiovascular: Denies palpitation, chest discomfort or lower extremity swelling Gastrointestinal:  Denies nausea, heartburn or change in bowel habits Skin: Denies abnormal skin rashes Lymphatics: Denies new lymphadenopathy or easy bruising Neurological:Denies numbness, tingling or new weaknesses Behavioral/Psych: Mood is stable, no new changes  All other systems were  reviewed with the patient and are negative.  PHYSICAL EXAMINATION: ECOG PERFORMANCE STATUS: 0 - Asymptomatic  Blood pressure 130/71, pulse 82, temperature 98.4 F (36.9 C), temperature source Oral, resp. rate 18, height '5\' 4"'  (1.626 m), weight 152 lb 4.8 oz (69.083 kg), SpO2 100.00%.  GENERAL:alert, no distress and comfortable; well-developed, well nourished.  SKIN: skin color, texture, turgor are normal, no rashes or significant lesions; + R Port a cath without tenderness; hyperpigmentation of her hands.  EYES: normal, Conjunctiva are pink and non-injected, sclera clear  OROPHARYNX:no exudate, no erythema and lips, buccal mucosa  NECK: supple, thyroid normal size, non-tender, without nodularity  LYMPH: no palpable lymphadenopathy in the cervical, axillary or supraclavicular  LUNGS: clear to auscultation and percussion with normal breathing effort  HEART: Tachycardic with regular rhythm and no murmurs and no lower extremity edema  ABDOMEN:abdomen soft, non-tender and normal bowel sounds Musculoskeletal:no cyanosis of digits and no clubbing no specific areas of point tenderness about the left shoulder NEURO: alert & oriented x 3 with fluent speech, no focal motor/sensory deficits  Labs:  Lab Results  Component Value Date   WBC 5.1 02/11/2014   HGB 10.3* 02/11/2014   HCT 32.2* 02/11/2014   MCV 89.0 02/11/2014   PLT 287 02/11/2014   NEUTROABS 3.5 02/11/2014      Chemistry      Component Value Date/Time   NA 139 02/11/2014 0817   NA 139 01/28/2014 0832   K 4.6 02/11/2014 0817   K 3.8 01/28/2014 1962  CL 104 02/11/2014 0817   CO2 24 02/11/2014 0817   CO2 21* 01/28/2014 0832   BUN 13 02/11/2014 0817   BUN 11.3 01/28/2014 0832   CREATININE 0.75 02/11/2014 0817   CREATININE 0.8 01/28/2014 0832      Component Value Date/Time   CALCIUM 11.7* 02/11/2014 0817   CALCIUM 11.4* 01/28/2014 0832   ALKPHOS 68 02/11/2014 0817   ALKPHOS 66 01/28/2014 0832   AST 13 02/11/2014 0817   AST 13 01/28/2014 0832    ALT 8 02/11/2014 0817   ALT 10 01/28/2014 0832   BILITOT <0.2* 02/11/2014 0817   BILITOT <0.20 01/28/2014 0832       Basic Metabolic Panel:  Recent Labs Lab 02/11/14 0817  NA 139  K 4.6  CL 104  CO2 24  GLUCOSE 92  BUN 13  CREATININE 0.75  CALCIUM 11.7*   GFR Estimated Creatinine Clearance: 70.5 ml/min (by C-G formula based on Cr of 0.75). Liver Function Tests:  Recent Labs Lab 02/11/14 0817  AST 13  ALT 8  ALKPHOS 68  BILITOT <0.2*  PROT 7.5  ALBUMIN 3.2*    CBC:  Recent Labs Lab 02/11/14 0816  WBC 5.1  NEUTROABS 3.5  HGB 10.3*  HCT 32.2*  MCV 89.0  PLT 287    Studies:  No results found.   RADIOGRAPHIC STUDIES: None  ASSESSMENT: Park Breed 63 y.o. female with a history of Colorectal cancer, stage IV - Plan: CBC with Differential, Comprehensive metabolic panel (Cmet) - [facility], [facility]   ASSESSMENT: 63 yo AAF with family history of colon cancer, admitted with obstructive mass s/p stent and biopsy on 11/21 consistent with metastatic colon cancer with mets to liver, spleen. ECOG 0. Elevated CEA, although her CEA has returned to the upper limit of normal at 5.0 today.   PLAN:   1. Migrated colorectal stent, s/p retrieval on 05/14 --The patient was discussed during GI conference. She was referred to Dr. Constance Holster R. for possible surgical intervention however she declined surgical intervention at this time.  She will follow up with GI/Surgery as needed.    2. 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  -- Today, she reports feeling well overall.  We changed her chemotherapy to FOLFIRI in February 2015.   There is no difference in overall response rate, time to progression and overall survival for patients treated with FOLFIRI versus FOLFOX4 in the treatment of advanced colorectal cancer. [facility], 2005, Aug 1; 23(22): 2542-70. For FOLFIRI and FOLFOX respectively, the median to to progression was 7  months versus 7 months; duration of response was 9 versus 10 months; overall survival was 14 versus 15 months and overall response rates were 31% versus 34%. Other potential therapies if progression is 5-FU/LV, CapeOx or capecitabine. We will continue FOLFORI on today 02/11/2014 q 14 day until progression or unacceptable toxicity.  It consists of the following:   Day # 1 Irinotecan 322 mg for 90 minutes  Leucovorin 400 mg/m2 for 2 hours before 5-FU d1  Fluorouracil 400 mg/m2 once starting 2.5 After treatment start time  Days #1/2 Fluorouracil 2,400 mg/m2 for 46 hours  Day #3 Pump D/C   The reference is from Sharon. Al, Irinotecan combined with fluorouracil compared with fluororacil alone as first-line treatment for metastatic colorectal cancer: a multicenter randomised trial.  Lancet 2000 Apr 15; 355 (6237):  In addition, we previously provided an in depth discussion concerning the indications and side-effects of  therapy. Side-effects includes but is not limited to bone marrow suppression (i.e., anemia, thrombocytopenia or leukopenia which can lead to life threatening infections), nausea or vomiting, gastrointestinal effects including diarrhea and electrolyte imbalances, neurotoxicity mainly from oxaliplatinum, liver and/or kidney dysfunction. We will not provide neulasta unless she has febrile neutropenia as risk of febrile neutropenia is less than 5%. We will follow labs prior to chemotherapy. She understood the indications, benefits and risk of therapy and chose to proceed.    --We will obtained a restaging CT abdomen on 02/08/2014.     Her CEA from 07/13 was 6.4.   3. Family history of Colon cancer.  --Based on her age and family history (sister deceased at age 34 from colon cancer), referral for genetic testing for Lynch syndrome, i.e. MSI or IHC, would be appropriate. We made a referral to genetics. Her tumor was negative for lynch syndrome. She was seen on 02/23 but declined further  testing for genetic syndromes presently.   4. 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  --Parathyroid hormone elevated 192.5 and parathyroid hormone-related peptide pending. PTH is generally low when hypercalcemia is secondary to malignancy. In primary PTH, it is elevated. We counseled continue aggressive fluid hydration. She was instructed to report to emergency room with worsening constipation or confusion. We referred to endocrinology for further evaluation. She was seen by Dr. Dwyane Dee.  We will give intermittent zometa.     5. Follow-up.  --Patient instructed to follow-up 2 weeks for labs, symptom visit, and consideration of next chemotherapy cycle. We will follow CEA monthly.   All questions were answered. The patient knows to call the clinic with any problems, questions or concerns. We can certainly see the patient much sooner if necessary.  Patient reviewed with Dr. Marko Plume.    Carlton Adam, PA-C 02/11/2014 4:03 PM

## 2014-02-13 ENCOUNTER — Other Ambulatory Visit: Payer: Self-pay | Admitting: Internal Medicine

## 2014-02-13 ENCOUNTER — Ambulatory Visit (HOSPITAL_BASED_OUTPATIENT_CLINIC_OR_DEPARTMENT_OTHER): Payer: Medicaid Other

## 2014-02-13 VITALS — BP 108/69 | HR 112 | Temp 98.0°F

## 2014-02-13 DIAGNOSIS — C19 Malignant neoplasm of rectosigmoid junction: Secondary | ICD-10-CM

## 2014-02-13 DIAGNOSIS — Z452 Encounter for adjustment and management of vascular access device: Secondary | ICD-10-CM

## 2014-02-13 MED ORDER — HEPARIN SOD (PORK) LOCK FLUSH 100 UNIT/ML IV SOLN
500.0000 [IU] | Freq: Once | INTRAVENOUS | Status: AC | PRN
  Administered 2014-02-13: 500 [IU]
  Filled 2014-02-13: qty 5

## 2014-02-13 MED ORDER — SODIUM CHLORIDE 0.9 % IJ SOLN
10.0000 mL | INTRAMUSCULAR | Status: DC | PRN
  Administered 2014-02-13: 10 mL
  Filled 2014-02-13: qty 10

## 2014-02-13 NOTE — Patient Instructions (Signed)
Monitor your shoulder pain Followup in 2 weeks prior treatment schedule cycle of chemotherapy

## 2014-02-18 ENCOUNTER — Ambulatory Visit: Payer: Medicaid Other

## 2014-02-25 ENCOUNTER — Other Ambulatory Visit (HOSPITAL_BASED_OUTPATIENT_CLINIC_OR_DEPARTMENT_OTHER): Payer: Medicaid Other

## 2014-02-25 ENCOUNTER — Ambulatory Visit (HOSPITAL_BASED_OUTPATIENT_CLINIC_OR_DEPARTMENT_OTHER): Payer: Medicaid Other

## 2014-02-25 ENCOUNTER — Ambulatory Visit (HOSPITAL_BASED_OUTPATIENT_CLINIC_OR_DEPARTMENT_OTHER): Payer: Medicaid Other | Admitting: Internal Medicine

## 2014-02-25 ENCOUNTER — Telehealth: Payer: Self-pay | Admitting: Internal Medicine

## 2014-02-25 ENCOUNTER — Encounter: Payer: Self-pay | Admitting: Internal Medicine

## 2014-02-25 VITALS — BP 118/64 | HR 89 | Temp 98.5°F | Resp 18 | Ht 64.0 in | Wt 154.6 lb

## 2014-02-25 DIAGNOSIS — C7889 Secondary malignant neoplasm of other digestive organs: Secondary | ICD-10-CM

## 2014-02-25 DIAGNOSIS — C19 Malignant neoplasm of rectosigmoid junction: Secondary | ICD-10-CM

## 2014-02-25 DIAGNOSIS — C7800 Secondary malignant neoplasm of unspecified lung: Secondary | ICD-10-CM

## 2014-02-25 DIAGNOSIS — C787 Secondary malignant neoplasm of liver and intrahepatic bile duct: Secondary | ICD-10-CM

## 2014-02-25 DIAGNOSIS — Z5111 Encounter for antineoplastic chemotherapy: Secondary | ICD-10-CM

## 2014-02-25 LAB — COMPREHENSIVE METABOLIC PANEL (CC13)
ALK PHOS: 57 U/L (ref 40–150)
ALT: 7 U/L (ref 0–55)
AST: 13 U/L (ref 5–34)
Albumin: 3.1 g/dL — ABNORMAL LOW (ref 3.5–5.0)
Anion Gap: 7 mEq/L (ref 3–11)
BUN: 12 mg/dL (ref 7.0–26.0)
CALCIUM: 11.5 mg/dL — AB (ref 8.4–10.4)
CHLORIDE: 109 meq/L (ref 98–109)
CO2: 23 mEq/L (ref 22–29)
Creatinine: 0.8 mg/dL (ref 0.6–1.1)
Glucose: 80 mg/dl (ref 70–140)
Potassium: 4.4 mEq/L (ref 3.5–5.1)
SODIUM: 139 meq/L (ref 136–145)
Total Bilirubin: <0.2 mg/dL (ref 0.20–1.20)
Total Protein: 7.3 g/dL (ref 6.4–8.3)

## 2014-02-25 LAB — CBC WITH DIFFERENTIAL/PLATELET
BASO%: 0.9 % (ref 0.0–2.0)
Basophils Absolute: 0 10*3/uL (ref 0.0–0.1)
EOS ABS: 0.1 10*3/uL (ref 0.0–0.5)
EOS%: 2.7 % (ref 0.0–7.0)
HCT: 32.7 % — ABNORMAL LOW (ref 34.8–46.6)
HEMOGLOBIN: 10.6 g/dL — AB (ref 11.6–15.9)
LYMPH#: 1 10*3/uL (ref 0.9–3.3)
LYMPH%: 22.4 % (ref 14.0–49.7)
MCH: 28.9 pg (ref 25.1–34.0)
MCHC: 32.4 g/dL (ref 31.5–36.0)
MCV: 89.2 fL (ref 79.5–101.0)
MONO#: 0.4 10*3/uL (ref 0.1–0.9)
MONO%: 8.6 % (ref 0.0–14.0)
NEUT#: 2.9 10*3/uL (ref 1.5–6.5)
NEUT%: 65.4 % (ref 38.4–76.8)
Platelets: 273 10*3/uL (ref 145–400)
RBC: 3.66 10*6/uL — AB (ref 3.70–5.45)
RDW: 15.2 % — AB (ref 11.2–14.5)
WBC: 4.4 10*3/uL (ref 3.9–10.3)

## 2014-02-25 LAB — CEA: CEA: 4.5 ng/mL (ref 0.0–5.0)

## 2014-02-25 MED ORDER — FLUOROURACIL CHEMO INJECTION 2.5 GM/50ML
400.0000 mg/m2 | Freq: Once | INTRAVENOUS | Status: AC
  Administered 2014-02-25: 700 mg via INTRAVENOUS
  Filled 2014-02-25: qty 14

## 2014-02-25 MED ORDER — SODIUM CHLORIDE 0.9 % IV SOLN
2400.0000 mg/m2 | INTRAVENOUS | Status: DC
  Administered 2014-02-25: 4300 mg via INTRAVENOUS
  Filled 2014-02-25: qty 86

## 2014-02-25 MED ORDER — ONDANSETRON 16 MG/50ML IVPB (CHCC)
INTRAVENOUS | Status: AC
  Filled 2014-02-25: qty 16

## 2014-02-25 MED ORDER — ONDANSETRON 16 MG/50ML IVPB (CHCC)
16.0000 mg | Freq: Once | INTRAVENOUS | Status: AC
  Administered 2014-02-25: 16 mg via INTRAVENOUS

## 2014-02-25 MED ORDER — ATROPINE SULFATE 1 MG/ML IJ SOLN
INTRAMUSCULAR | Status: AC
  Filled 2014-02-25: qty 1

## 2014-02-25 MED ORDER — ATROPINE SULFATE 1 MG/ML IJ SOLN
0.5000 mg | Freq: Once | INTRAMUSCULAR | Status: AC | PRN
  Administered 2014-02-25: 0.5 mg via INTRAVENOUS

## 2014-02-25 MED ORDER — DEXTROSE 5 % IV SOLN
180.0000 mg/m2 | Freq: Once | INTRAVENOUS | Status: AC
  Administered 2014-02-25: 320 mg via INTRAVENOUS
  Filled 2014-02-25: qty 16

## 2014-02-25 MED ORDER — LEUCOVORIN CALCIUM INJECTION 350 MG
700.0000 mg | Freq: Once | INTRAMUSCULAR | Status: AC
  Administered 2014-02-25: 700 mg via INTRAVENOUS
  Filled 2014-02-25: qty 35

## 2014-02-25 MED ORDER — DEXAMETHASONE SODIUM PHOSPHATE 20 MG/5ML IJ SOLN
INTRAMUSCULAR | Status: AC
  Filled 2014-02-25: qty 5

## 2014-02-25 MED ORDER — DEXAMETHASONE SODIUM PHOSPHATE 20 MG/5ML IJ SOLN
20.0000 mg | Freq: Once | INTRAMUSCULAR | Status: AC
  Administered 2014-02-25: 20 mg via INTRAVENOUS

## 2014-02-25 MED ORDER — SODIUM CHLORIDE 0.9 % IV SOLN
Freq: Once | INTRAVENOUS | Status: AC
  Administered 2014-02-25: 11:00:00 via INTRAVENOUS

## 2014-02-25 NOTE — Progress Notes (Signed)
[facility] OFFICE PROGRESS NOTE  Fusako Tanabe, MD [place] [address]  DIAGNOSIS: Colorectal cancer, stage IV - Plan: CBC with Differential, Comprehensive metabolic panel (Cmet) - [facility], Lactate dehydrogenase (LDH) - [facility], CEA  Chief Complaint  Patient presents with  . Colorectal cancer, stage IV    CURRENT TREATMENT: Folfox q 14 weeks (07/09/2013 until 08/20/2013).  Switched to [facility] q 14 days due to mixed response (started on 09/13/2013).      Colorectal cancer, stage IV   05/22/2013 - 05/27/2013 Hospital Admission A/w abdominal distention and constipation for 2 days CT scan of her abdomen and pelvis showed area of bowel wall thickening at the rectosigmoid junction concerning for malignancy.   05/22/2013 Imaging CT of Abdomen: bowel wall thickening with shouldering of the proximal and distal margins, involving therectosigmoid junction, supicous liver spleen mets. Colonsocopy recommended.    05/23/2013 Tumor Marker CEA 225.7   05/24/2013 Imaging MRI of abdomen: 3.7 x 4.4 cm enhancing lesion along the superior spleen, indeterminate but worrisome for metastasis.2.7 x 3.9 cm enhancing lesion in the medial segment left hepatic lobe,    05/25/2013 Pathology Results Diagnosis Rectum, biopsy, proximal - INVASIVE ADENOCARCINOMA.   05/25/2013 Procedure Flex sig: (Dr. Ardis Hughs). 5-6 cm obstructing rectosigmoid mass distal end 10 cm from anal verge; mass biopsed and then stented 9 cm long, 22 cm diameter uncovered SEM.    05/25/2013 Initial Diagnosis Colorectal cancer, stage IV   06/25/2013 Procedure R port a cath placement.   07/09/2013 - 08/20/2013 Chemotherapy FOLFOX q 2 weeks started.  Not elgible for clinical trial and bevacimab based on stent and high risk for perforation.    07/30/2013 Tumor Marker KRAS positive.  p53, APC identified. Mismatch Repair (MMR) preserved.    08/06/2013 Adverse Reaction Complains of darkening of her hands with peeling.    08/27/2013 Family  History She was seen by Genetic couselor.  She declined testing today, but will call and reschedule an appointment should she decide to pursue testing.   09/07/2013 Imaging CT abdomen: Mixed response to therapy. Response to therapy of hepatic metastasis. right hepatic hemangiomas are again identified. Other too small to characterize liver lesions are felt to be similar but are indeterminate.. Enlargement of a splenic lesions   09/13/2013 Tumor Marker CEA 33.5   09/13/2013 Treatment Plan Change Reviewed scans consistent with mixed response.  Switch to FOLFIRI.  Provided script for immodium.    10/29/2013 Adverse Reaction Patient reports intense abdominal cramping lasting the first few days on chemotherapy.  Starting Hyoscyamine 0.125 mg q 6 hours prn.    11/15/2013 - 11/15/2013 Hospital Admission Patient presented to ED with migrated stent.  Stent retrieved by Dr. Ardis Hughs.  Imaging negative for perforation.   Discussed in conference with referral to Dr. Zella Richer made.    11/23/2013 Imaging CT Chest. 1. Stable left lower lobe 5 mm pulmonary nodule. 2. Interval increase in size splenic mass is concerning formetastatic lesion.3. Interval calcification of the left hepatic lobe metastasis. Noevidence of active residual disease by CT.   12/10/2013 Tumor Marker CEA 8.3   01/14/2014 Tumor Marker CEA, 6.4   02/11/2014 Tumor Marker CEA, 5.0    INTERVAL HISTORY: [name] [name] 63 y.o. female with a history of Stage IV colon cancer is here for follow-up.  She was last seen by me on 02/11/2014   Today, she reports her bowel movements are regular and that she is with minimal pain. She denies any fevers or chills. She also  reports that her weight is stable with an adequate appetite. She is very active and handles her basic, intermediate and advanced ADLs without difficulties.  She reports good energy and appetite.  She is planning a family vacation next month and ask that her chemotherapy be delayed and reconsidered on her one  month follow up. CT scans of her abdomen have been refused.   MEDICAL HISTORY: Past Medical History  Diagnosis Date  . Anxiety   . Colon cancer     dx'd 2014    INTERIM HISTORY: has Rectal mass; Colon obstruction; Hypercalcemia; Colorectal cancer, stage IV; Family history of colorectal cancer; Thrush, oral; Tachycardia; and Migrated colon stent on her problem list.    ALLERGIES:  is allergic to codeine.  MEDICATIONS: has a current medication list which includes the following prescription(s): dexamethasone, hyoscyamine, lidocaine-prilocaine, loperamide, lorazepam, ondansetron, polyethylene glycol powder, and prochlorperazine, and the following Facility-Administered Medications: sodium chloride, atropine, fluorouracil (ADRUCIL) 4,300 mg in sodium chloride 0.9 % 150 mL chemo infusion, fluorouracil, irinotecan (CAMPTOSAR) 320 mg in dextrose 5 % 500 mL chemo infusion, and leucovorin 700 mg in dextrose 5 % 250 mL infusion.  SURGICAL HISTORY:  Past Surgical History  Procedure Laterality Date  . Abdominal hysterectomy  2005  . Cesarean section  1976  . Flexible sigmoidoscopy N/A 05/25/2013    Procedure: FLEXIBLE SIGMOIDOSCOPY;  Surgeon: [name], MD;  Location: [facility];  Service: Endoscopy;  Laterality: N/A;  needs floro  . Colonic stent placement N/A 05/25/2013    Procedure: COLONIC STENT PLACEMENT;  Surgeon: [name], MD;  Location: [facility];  Service: Endoscopy;  Laterality: N/A;  . Portacath placement Right 06/25/2013    Procedure: ULTRA SOUND GUIDED INSERTION PORT-A-CATH;  Surgeon: [name], MD;  Location: [facility];  Service: General;  Laterality: Right;    REVIEW OF SYSTEMS:   Constitutional: Denies fevers, chills or abnormal weight loss Eyes: Denies blurriness of vision Ears, nose, mouth, throat, and face: Denies mucositis or sore throat Respiratory: Denies cough, dyspnea or wheezes Cardiovascular: Denies palpitation, chest discomfort  or lower extremity swelling Gastrointestinal:  Denies nausea, heartburn or change in bowel habits Skin: Denies abnormal skin rashes Lymphatics: Denies new lymphadenopathy or easy bruising Neurological:Denies numbness, tingling or new weaknesses Behavioral/Psych: Mood is stable, no new changes  All other systems were reviewed with the patient and are negative.  PHYSICAL EXAMINATION: ECOG PERFORMANCE STATUS: 0 - Asymptomatic  Blood pressure 118/64, pulse 89, temperature 98.5 F (36.9 C), temperature source Oral, resp. rate 18, height '5\' 4"'  (1.626 m), weight 154 lb 9.6 oz (70.126 kg), SpO2 100.00%.  GENERAL:alert, no distress and comfortable; well-developed, well nourished.  SKIN: skin color, texture, turgor are normal, no rashes or significant lesions; + R Port a cath without tenderness; hyperpigmentation of her hands.  EYES: normal, Conjunctiva are pink and non-injected, sclera clear  OROPHARYNX:no exudate, no erythema and lips, buccal mucosa  NECK: supple, thyroid normal size, non-tender, without nodularity  LYMPH: no palpable lymphadenopathy in the cervical, axillary or supraclavicular  LUNGS: clear to auscultation and percussion with normal breathing effort  HEART: Tachycardic with regular rhythm and no murmurs and no lower extremity edema  ABDOMEN:abdomen soft, non-tender and normal bowel sounds Musculoskeletal:no cyanosis of digits and no clubbing  NEURO: alert & oriented x 3 with fluent speech, no focal motor/sensory deficits  Labs:  Lab Results  Component Value Date   WBC 4.4 02/25/2014   HGB 10.6* 02/25/2014   HCT 32.7* 02/25/2014  MCV 89.2 02/25/2014   PLT 273 02/25/2014   NEUTROABS 2.9 02/25/2014      Chemistry      Component Value Date/Time   NA 139 02/25/2014 0820   NA 139 02/11/2014 0817   K 4.4 02/25/2014 0820   K 4.6 02/11/2014 0817   CL 104 02/11/2014 0817   CO2 23 02/25/2014 0820   CO2 24 02/11/2014 0817   BUN 12.0 02/25/2014 0820   BUN 13 02/11/2014 0817    CREATININE 0.8 02/25/2014 0820   CREATININE 0.75 02/11/2014 0817      Component Value Date/Time   CALCIUM 11.5* 02/25/2014 0820   CALCIUM 11.7* 02/11/2014 0817   ALKPHOS 57 02/25/2014 0820   ALKPHOS 68 02/11/2014 0817   AST 13 02/25/2014 0820   AST 13 02/11/2014 0817   ALT 7 02/25/2014 0820   ALT 8 02/11/2014 0817   BILITOT <0.20 02/25/2014 0820   BILITOT <0.2* 02/11/2014 0817       Basic Metabolic Panel:  Recent Labs Lab 02/25/14 0820  NA 139  K 4.4  CO2 23  GLUCOSE 80  BUN 12.0  CREATININE 0.8  CALCIUM 11.5*   GFR Estimated Creatinine Clearance: 71 ml/min (by C-G formula based on Cr of 0.8). Liver Function Tests:  Recent Labs Lab 02/25/14 0820  AST 13  ALT 7  ALKPHOS 57  BILITOT <0.20  PROT 7.3  ALBUMIN 3.1*    CBC:  Recent Labs Lab 02/25/14 0820  WBC 4.4  NEUTROABS 2.9  HGB 10.6*  HCT 32.7*  MCV 89.2  PLT 273    Studies:  No results found.   RADIOGRAPHIC STUDIES: None  ASSESSMENT: Park Breed 63 y.o. female with a history of Colorectal cancer, stage IV - Plan: CBC with Differential, Comprehensive metabolic panel (Cmet) - [facility], Lactate dehydrogenase (LDH) - [facility], CEA   ASSESSMENT: 63 yo AAF with family history of colon cancer, admitted with obstructive mass s/p stent and biopsy on 05/25/13 consistent with metastatic colon cancer with mets to liver, spleen. ECOG 0. Elevated CEA.   PLAN:   1. 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  -- Today, she reports feeling well overall.  We changed her chemotherapy to FOLFIRI in February 2015.   There is no difference in overall response rate, time to progression and overall survival for patients treated with FOLFIRI versus FOLFOX4 in the treatment of advanced colorectal cancer. [facility], 2005, Aug 1; 23(22): 8527-78. For FOLFIRI and FOLFOX respectively, the median to to progression was 7 months versus 7 months; duration of response was 9 versus  10 months; overall survival was 14 versus 15 months and overall response rates were 31% versus 34%. Other potential therapies if progression is 5-FU/LV, CapeOx or capecitabine. We will continue FOLFORI on today 02/25/2014 q 14 day until progression or unacceptable toxicity.  It consists of the following:   Day # 1 Irinotecan 322 mg for 90 minutes  Leucovorin 400 mg/m2 for 2 hours before 5-FU d1  Fluorouracil 400 mg/m2 once starting 2.5 After treatment start time  Days #1/2 Fluorouracil 2,400 mg/m2 for 46 hours  Day #3 Pump D/C   The reference is from [place]. Al, Irinotecan combined with fluorouracil compared with fluororacil alone as first-line treatment for metastatic colorectal cancer: a multicenter randomised trial.  Lancet 2000 Apr 15; 355 (2423).  Her CEA from 07/13 was 5.0 last visit.  Ct of abdomen was refused.  We will provide a one  month chemotherapy "holiday" at her request to facilitate travel next month on vacation.  We will schedule her for a visit on 03/25/2014 and consider restarting her chemotherapy the following week. She understands that her cancer can progression without treatment and was agreement with this plan.   2. S/p migrated colorectal stent, s/p retrieval on 05/14 --  We referred to Dr. Zella Richer for consideration of surgery if indicated and she had an appointment on 06/30.  She declined surgical intervention presently.  She will follow up with GI/Surgery as needed.    3. Family history of Colon cancer.  --Based on her age and family history (sister deceased at age 77 from colon cancer), referral for genetic testing for Lynch syndrome, i.e. MSI or IHC, would be appropriate. We made a referral to genetics. Her tumor was negative for lynch syndrome. She was seen on 02/23 but declined further testing for genetic syndromes presently.   4. 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  --Parathyroid hormone elevated 192.5 and parathyroid hormone-related  peptide pending. PTH is generally low when hypercalcemia is secondary to malignancy. In primary PTH, it is elevated. We counseled continue aggressive fluid hydration. She was instructed to report to emergency room with worsening constipation or confusion. We referred to endocrinology for further evaluation. She was seen by Dr. Dwyane Dee.  We will give intermittent zometa.     5. Follow-up.  --Patient instructed to follow-up 4 weeks (03/25/2014)  for labs, symptom visit, and consideration of next chemotherapy cycle the following week (04/01/2014). We will follow CEA monthly.   All questions were answered. The patient knows to call the clinic with any problems, questions or concerns. We can certainly see the patient much sooner if necessary.  I spent 10 minutes counseling the patient face to face. The total time spent in the appointment was 15 minutes.    Shantia Sanford, MD 02/25/2014 11:03 AM

## 2014-02-25 NOTE — Telephone Encounter (Signed)
gv and printed appt sched and avs for pt fro Aug thru [facility]....pt did not want to come week of birthday

## 2014-02-25 NOTE — Patient Instructions (Signed)
[facility] Discharge Instructions for Patients Receiving Chemotherapy  Today you received the following chemotherapy agents FOLFIRI.  To help prevent nausea and vomiting after your treatment, we encourage you to take your nausea medication as directed.    If you develop nausea and vomiting that is not controlled by your nausea medication, call the clinic.   BELOW ARE SYMPTOMS THAT SHOULD BE REPORTED IMMEDIATELY:  *FEVER GREATER THAN 100.5 F  *CHILLS WITH OR WITHOUT FEVER  NAUSEA AND VOMITING THAT IS NOT CONTROLLED WITH YOUR NAUSEA MEDICATION  *UNUSUAL SHORTNESS OF BREATH  *UNUSUAL BRUISING OR BLEEDING  TENDERNESS IN MOUTH AND THROAT WITH OR WITHOUT PRESENCE OF ULCERS  *URINARY PROBLEMS  *BOWEL PROBLEMS  UNUSUAL RASH Items with * indicate a potential emergency and should be followed up as soon as possible.  Feel free to call the clinic you have any questions or concerns. The clinic phone number is [phone].    

## 2014-02-27 ENCOUNTER — Ambulatory Visit (HOSPITAL_BASED_OUTPATIENT_CLINIC_OR_DEPARTMENT_OTHER): Payer: Medicaid Other

## 2014-02-27 VITALS — BP 110/47 | Temp 97.9°F

## 2014-02-27 DIAGNOSIS — C19 Malignant neoplasm of rectosigmoid junction: Secondary | ICD-10-CM

## 2014-02-27 DIAGNOSIS — Z452 Encounter for adjustment and management of vascular access device: Secondary | ICD-10-CM

## 2014-02-27 MED ORDER — HEPARIN SOD (PORK) LOCK FLUSH 100 UNIT/ML IV SOLN
500.0000 [IU] | Freq: Once | INTRAVENOUS | Status: AC | PRN
  Administered 2014-02-27: 500 [IU]
  Filled 2014-02-27: qty 5

## 2014-02-27 MED ORDER — SODIUM CHLORIDE 0.9 % IJ SOLN
10.0000 mL | INTRAMUSCULAR | Status: DC | PRN
  Administered 2014-02-27: 10 mL
  Filled 2014-02-27: qty 10

## 2014-03-03 ENCOUNTER — Other Ambulatory Visit: Payer: Self-pay | Admitting: Internal Medicine

## 2014-04-01 ENCOUNTER — Ambulatory Visit (HOSPITAL_BASED_OUTPATIENT_CLINIC_OR_DEPARTMENT_OTHER): Payer: Medicaid Other | Admitting: Hematology

## 2014-04-01 ENCOUNTER — Telehealth: Payer: Self-pay | Admitting: Hematology

## 2014-04-01 ENCOUNTER — Other Ambulatory Visit (HOSPITAL_BASED_OUTPATIENT_CLINIC_OR_DEPARTMENT_OTHER): Payer: Medicaid Other

## 2014-04-01 ENCOUNTER — Ambulatory Visit (HOSPITAL_BASED_OUTPATIENT_CLINIC_OR_DEPARTMENT_OTHER): Payer: Medicaid Other

## 2014-04-01 ENCOUNTER — Telehealth: Payer: Self-pay | Admitting: *Deleted

## 2014-04-01 ENCOUNTER — Encounter: Payer: Self-pay | Admitting: Hematology

## 2014-04-01 VITALS — BP 136/72 | HR 106 | Temp 98.2°F | Resp 18 | Ht 64.0 in | Wt 155.0 lb

## 2014-04-01 DIAGNOSIS — C189 Malignant neoplasm of colon, unspecified: Secondary | ICD-10-CM

## 2014-04-01 DIAGNOSIS — C19 Malignant neoplasm of rectosigmoid junction: Secondary | ICD-10-CM

## 2014-04-01 DIAGNOSIS — Z95828 Presence of other vascular implants and grafts: Secondary | ICD-10-CM

## 2014-04-01 DIAGNOSIS — C787 Secondary malignant neoplasm of liver and intrahepatic bile duct: Secondary | ICD-10-CM

## 2014-04-01 DIAGNOSIS — C7889 Secondary malignant neoplasm of other digestive organs: Secondary | ICD-10-CM

## 2014-04-01 LAB — LACTATE DEHYDROGENASE (CC13): LDH: 194 U/L (ref 125–245)

## 2014-04-01 LAB — CBC WITH DIFFERENTIAL/PLATELET
BASO%: 0.3 % (ref 0.0–2.0)
Basophils Absolute: 0 10*3/uL (ref 0.0–0.1)
EOS ABS: 0.1 10*3/uL (ref 0.0–0.5)
EOS%: 2.4 % (ref 0.0–7.0)
HCT: 33.6 % — ABNORMAL LOW (ref 34.8–46.6)
HGB: 10.9 g/dL — ABNORMAL LOW (ref 11.6–15.9)
LYMPH#: 1.5 10*3/uL (ref 0.9–3.3)
LYMPH%: 26.2 % (ref 14.0–49.7)
MCH: 28.6 pg (ref 25.1–34.0)
MCHC: 32.4 g/dL (ref 31.5–36.0)
MCV: 88.2 fL (ref 79.5–101.0)
MONO#: 0.5 10*3/uL (ref 0.1–0.9)
MONO%: 8.7 % (ref 0.0–14.0)
NEUT%: 62.4 % (ref 38.4–76.8)
NEUTROS ABS: 3.6 10*3/uL (ref 1.5–6.5)
Platelets: 295 10*3/uL (ref 145–400)
RBC: 3.81 10*6/uL (ref 3.70–5.45)
RDW: 13.9 % (ref 11.2–14.5)
WBC: 5.8 10*3/uL (ref 3.9–10.3)

## 2014-04-01 LAB — CEA: CEA: 4.8 ng/mL (ref 0.0–5.0)

## 2014-04-01 LAB — COMPREHENSIVE METABOLIC PANEL (CC13)
ALT: 8 U/L (ref 0–55)
ANION GAP: 8 meq/L (ref 3–11)
AST: 16 U/L (ref 5–34)
Albumin: 3.3 g/dL — ABNORMAL LOW (ref 3.5–5.0)
Alkaline Phosphatase: 75 U/L (ref 40–150)
BUN: 18.4 mg/dL (ref 7.0–26.0)
CO2: 22 meq/L (ref 22–29)
Calcium: 11.7 mg/dL — ABNORMAL HIGH (ref 8.4–10.4)
Chloride: 109 mEq/L (ref 98–109)
Creatinine: 0.8 mg/dL (ref 0.6–1.1)
GLUCOSE: 91 mg/dL (ref 70–140)
POTASSIUM: 4.3 meq/L (ref 3.5–5.1)
SODIUM: 139 meq/L (ref 136–145)
TOTAL PROTEIN: 7.9 g/dL (ref 6.4–8.3)
Total Bilirubin: 0.22 mg/dL (ref 0.20–1.20)

## 2014-04-01 MED ORDER — SODIUM CHLORIDE 0.9 % IJ SOLN
10.0000 mL | INTRAMUSCULAR | Status: DC | PRN
  Administered 2014-04-01: 10 mL via INTRAVENOUS
  Filled 2014-04-01: qty 10

## 2014-04-01 MED ORDER — HEPARIN SOD (PORK) LOCK FLUSH 100 UNIT/ML IV SOLN
500.0000 [IU] | Freq: Once | INTRAVENOUS | Status: AC
  Administered 2014-04-01: 500 [IU] via INTRAVENOUS
  Filled 2014-04-01: qty 5

## 2014-04-01 NOTE — Patient Instructions (Signed)

## 2014-04-01 NOTE — Progress Notes (Signed)
[facility] PROGRESS NOTE DATE OF VISIT: 04/01/2014    DIAGNOSIS: Colon cancer - Plan: CBC with Differential, Comprehensive metabolic panel (Cmet) - [facility], Ambulatory referral to Social Work, CEA  Chief Complaint  Patient presents with  . Follow-up    CURRENT TREATMENT: Folfox q 14 weeks (07/09/2013 until 08/20/2013).  Switched to [facility] q 14 days due to mixed response (started on 09/13/2013).  After a chemoherapy break x 1 month, I will resume FOLFIRI regimen on 04/08/14 but will change the frequency to Every 3 weeks for better tolerance.    Colorectal cancer, stage IV   05/22/2013 - 05/27/2013 Hospital Admission A/w abdominal distention and constipation for 2 days CT scan of her abdomen and pelvis showed area of bowel wall thickening at the rectosigmoid junction concerning for malignancy.   05/22/2013 Imaging CT of Abdomen: bowel wall thickening with shouldering of the proximal and distal margins, involving therectosigmoid junction, supicous liver spleen mets. Colonsocopy recommended.    05/23/2013 Tumor Marker CEA 225.7   05/24/2013 Imaging MRI of abdomen: 3.7 x 4.4 cm enhancing lesion along the superior spleen, indeterminate but worrisome for metastasis.2.7 x 3.9 cm enhancing lesion in the medial segment left hepatic lobe,    05/25/2013 Pathology Results Diagnosis Rectum, biopsy, proximal - INVASIVE ADENOCARCINOMA.   05/25/2013 Procedure Flex sig: (Dr. Ardis Hughs). 5-6 cm obstructing rectosigmoid mass distal end 10 cm from anal verge; mass biopsed and then stented 9 cm long, 22 cm diameter uncovered SEM.    05/25/2013 Initial Diagnosis Colorectal cancer, stage IV   06/25/2013 Procedure R port a cath placement.   07/09/2013 - 08/20/2013 Chemotherapy FOLFOX q 2 weeks started.  Not elgible for clinical trial and bevacimab based on stent and high risk for perforation.    07/30/2013 Tumor Marker KRAS positive.  p53, APC identified. Mismatch Repair (MMR) preserved.    08/06/2013 Adverse Reaction Complains of darkening of her hands with peeling.    08/27/2013 Family History She was seen by Genetic couselor.  She declined testing today, but will call and reschedule an appointment should she decide to pursue testing.   09/07/2013 Imaging CT abdomen: Mixed response to therapy. Response to therapy of hepatic metastasis. right hepatic hemangiomas are again identified. Other too small to characterize liver lesions are felt to be similar but are indeterminate.. Enlargement of a splenic lesions   09/13/2013 Tumor Marker CEA 33.5   09/13/2013 Treatment Plan Change Reviewed scans consistent with mixed response.  Switch to FOLFIRI.  Provided script for immodium.    10/29/2013 Adverse Reaction Patient reports intense abdominal cramping lasting the first few days on chemotherapy.  Starting Hyoscyamine 0.125 mg q 6 hours prn.    11/15/2013 - 11/15/2013 Hospital Admission Patient presented to ED with migrated stent.  Stent retrieved by Dr. Ardis Hughs.  Imaging negative for perforation.   Discussed in conference with referral to Dr. Zella Richer made.    11/23/2013 Imaging CT Chest. 1. Stable left lower lobe 5 mm pulmonary nodule. 2. Interval increase in size splenic mass is concerning formetastatic lesion.3. Interval calcification of the left hepatic lobe metastasis. Noevidence of active residual disease by CT.   12/10/2013 Tumor Marker CEA 8.3   01/14/2014 Tumor Marker CEA, 6.4   02/11/2014 Tumor Marker CEA, 5.0   02/25/2014 -  Chemotherapy Folfiri given and also given a chemotherapy Holiday for 1 month   02/25/2014 Tumor Marker CEA 4.5    INTERVAL HISTORY: [name] [name] 63 y.o. female with a history of Stage IV colon  cancer is here for follow-up.  She was last seen by Dr [name] on 02/25/14 and first visit with me.   Today, she reports her bowel movements are regular and that she is with minimal pain. She denies any fevers or chills. She also reports that her weight is stable with an adequate  appetite. She is very active and handles her basic, intermediate and advanced ADLs without difficulties.  She reports good energy and appetite.  She had a good family time while she was off chemotherapy and really does not want to go back on it. After a lot of discussion and talk, she agreed to resuming chemotherapy every 3 weeks so she gets an extra week off (instead of every 2 weeks) and we will resume that on 04/08/14.  MEDICAL HISTORY: Past Medical History  Diagnosis Date  . Anxiety   . Colon cancer     dx'd 2014    INTERIM HISTORY: has Rectal mass; Colon obstruction; Hypercalcemia; Colorectal cancer, stage IV; Family history of colorectal cancer; Thrush, oral; Tachycardia; and Migrated colon stent on her problem list.    ALLERGIES:  is allergic to codeine.  MEDICATIONS: has a current medication list which includes the following prescription(s): lidocaine-prilocaine, polyethylene glycol powder, loperamide, lorazepam, ondansetron, and prochlorperazine, and the following Facility-Administered Medications: sodium chloride.  SURGICAL HISTORY:  Past Surgical History  Procedure Laterality Date  . Abdominal hysterectomy  2005  . Cesarean section  1976  . Flexible sigmoidoscopy N/A 05/25/2013    Procedure: FLEXIBLE SIGMOIDOSCOPY;  Surgeon: Daniel P Jacobs, MD;  Location: [facility];  Service: Endoscopy;  Laterality: N/A;  needs floro  . Colonic stent placement N/A 05/25/2013    Procedure: COLONIC STENT PLACEMENT;  Surgeon: Daniel P Jacobs, MD;  Location: [facility];  Service: Endoscopy;  Laterality: N/A;  . Portacath placement Right 06/25/2013    Procedure: ULTRA SOUND GUIDED INSERTION PORT-A-CATH;  Surgeon: Todd J Rosenbower, MD;  Location: [facility];  Service: General;  Laterality: Right;    REVIEW OF SYSTEMS:   Constitutional: Denies fevers, chills or abnormal weight loss Eyes: Denies blurriness of vision Ears, nose, mouth, throat, and face: Denies mucositis or sore  throat Respiratory: Denies cough, dyspnea or wheezes Cardiovascular: Denies palpitation, chest discomfort or lower extremity swelling Gastrointestinal:  Denies nausea, heartburn or change in bowel habits Skin: Denies abnormal skin rashes Lymphatics: Denies new lymphadenopathy or easy bruising Neurological:Denies numbness, tingling or new weaknesses Behavioral/Psych: Mood is stable, no new changes  All other systems were reviewed with the patient and are negative.  PHYSICAL EXAMINATION: ECOG PERFORMANCE STATUS: 0-Asymptomatic  Blood pressure 136/72, pulse 106, temperature 98.2 F (36.8 C), temperature source Oral, resp. rate 18, height 5' 4" (1.626 m), weight 155 lb (70.308 kg), SpO2 100.00%.  GENERAL:alert, no distress and comfortable; well-developed, well nourished.  SKIN: skin color, texture, turgor are normal, no rashes or significant lesions; + R Port a cath without tenderness; hyperpigmentation of her hands.  EYES: normal, Conjunctiva are pink and non-injected, sclera clear  OROPHARYNX:no exudate, no erythema and lips, buccal mucosa  NECK: supple, thyroid normal size, non-tender, without nodularity  LYMPH: no palpable lymphadenopathy in the cervical, axillary or supraclavicular  LUNGS: clear to auscultation and percussion with normal breathing effort  HEART: Tachycardic with regular rhythm and no murmurs and no lower extremity edema  ABDOMEN:abdomen soft, non-tender and normal bowel sounds Musculoskeletal:no cyanosis of digits and no clubbing  NEURO: alert & oriented x 3 with fluent speech, no focal motor/sensory deficits  Labs:    Lab Results  Component Value Date   WBC 5.8 04/01/2014   HGB 10.9* 04/01/2014   HCT 33.6* 04/01/2014   MCV 88.2 04/01/2014   PLT 295 04/01/2014   NEUTROABS 3.6 04/01/2014      Chemistry      Component Value Date/Time   NA 139 04/01/2014 0806   NA 139 02/11/2014 0817   K 4.3 04/01/2014 0806   K 4.6 02/11/2014 0817   CL 104 02/11/2014 0817   CO2 22  04/01/2014 0806   CO2 24 02/11/2014 0817   BUN 18.4 04/01/2014 0806   BUN 13 02/11/2014 0817   CREATININE 0.8 04/01/2014 0806   CREATININE 0.75 02/11/2014 0817      Component Value Date/Time   CALCIUM 11.7* 04/01/2014 0806   CALCIUM 11.7* 02/11/2014 0817   ALKPHOS 75 04/01/2014 0806   ALKPHOS 68 02/11/2014 0817   AST 16 04/01/2014 0806   AST 13 02/11/2014 0817   ALT 8 04/01/2014 0806   ALT 8 02/11/2014 0817   BILITOT 0.22 04/01/2014 0806   BILITOT <0.2* 02/11/2014 0817      RADIOGRAPHIC STUDIES:  CT CHEST W CONTRAST 11/23/13: IMPRESSION:  1. Stable left lower lobe 5 mm pulmonary nodule. 2. Interval increase in size splenic mass is concerning for metastatic lesion.  3. Interval calcification of the left hepatic lobe metastasis. No evidence of active residual disease by CT. 4. Stable benign hepatic hemangiomas.    ASSESSMENT: [name] [name] 63 y.o. female with a history of STAGE 4 COLON cancer on palliative chemotherapy with FOLFIRI regimen and here after a 1 month break. She was admitted with obstructive mass s/p stent and biopsy on 05/25/13 consistent with metastatic colon cancer with mets to liver, spleen. ECOG 0. Elevated CEA.   PLAN:   1. 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  -- Today, she reports feeling well overall.  We changed her chemotherapy to FOLFIRI in February 2015.   There is no difference in overall response rate, time to progression and overall survival for patients treated with FOLFIRI versus FOLFOX4 in the treatment of advanced colorectal cancer. (Colucci G et, J Clin Oncol, 2005, Aug 1; 23(22): 4866-75. For FOLFIRI and FOLFOX respectively, the median to to progression was 7 months versus 7 months; duration of response was 9 versus 10 months; overall survival was 14 versus 15 months and overall response rates were 31% versus 34%. Other potential therapies if progression is 5-FU/LV, CapeOx or capecitabine. We will continue FOLFIRI  on 04/08/2014 q 21 days until progression or unacceptable toxicity.  It consists of the following:   Day # 1 Irinotecan 322 mg for 90 minutes  Leucovorin 400 mg/m2 for 2 hours before 5-FU d1  Fluorouracil 400 mg/m2 once starting 2.5 After treatment start time  Days #1/2 Fluorouracil 2,400 mg/m2 for 46 hours  Day #3 Pump D/C   The reference is from Douillard JY1 et. Al, Irinotecan combined with fluorouracil compared with fluororacil alone as first-line treatment for metastatic colorectal cancer: a multicenter randomised trial.  Lancet 2000 Apr 15; 355 (9212).  Her CEA from 07/13 was 5.0 last visit and 4.5 in August.  Ct of abdomen was refused.  She got one month chemotherapy "holiday" at her request to facilitate travel next month on vacation and feeling better after that.  Now we will resume the FOLFIRI q 3 weeks. There was also discussion that after 3 months and doing another re-staging scan we can switch her to Xeloda and I   will check with our social workers what would be the cost of Xeloda for patient?  2. S/p migrated colorectal stent, s/p retrieval on 05/14 --  We referred to Dr. Rosenbower for consideration of surgery if indicated and she had an appointment on 06/30.  She declined surgical intervention presently.  She will follow up with GI/Surgery as needed.    3. Family history of Colon cancer.  --Based on her age and family history (sister deceased at age 46 from colon cancer), referral for genetic testing for Lynch syndrome, i.e. MSI or IHC, would be appropriate. We made a referral to genetics. Her tumor was negative for lynch syndrome. She was seen on 02/23 but declined further testing for genetic syndromes presently.   4. 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  --Parathyroid hormone elevated 192.5 and parathyroid hormone-related peptide pending. PTH is generally low when hypercalcemia is secondary to malignancy. In primary PTH, it is elevated. We counseled continue  aggressive fluid hydration. She was instructed to report to emergency room with worsening constipation or confusion. We referred to endocrinology for further evaluation. She was seen by Dr. Kumar.  A NM Parathyroid w/spect/ct has been ordered by Dr Kumar.    5. Follow-up.  --Patient instructed to follow-up 04/29/14  for labs, symptom visit, and consideration of next chemotherapy cycle.   All questions were answered. The patient knows to call the clinic with any problems, questions or concerns. We can certainly see the patient much sooner if necessary.  I spent 30 minutes counseling the patient face to face. The total time spent in the appointment was 35 minutes.    Aasim Sehbai, MD Medical Hematologist/Oncologist [facility] Pager: [phone] Office No: [phone] 

## 2014-04-01 NOTE — Telephone Encounter (Signed)
Called pt confirming chemo/lad on 10/06 instead of 10/05 no availability per Sharyn Lull in chemo per 09/28 POF.... Cherylann Banas

## 2014-04-01 NOTE — Telephone Encounter (Signed)
Per staff message and POF I have scheduled appts. Advised scheduler of appts and that there is no available on 10/5 moved to 10/6 . JMW

## 2014-04-01 NOTE — Telephone Encounter (Signed)
Pt confirmed labs/ov per 09/28 POF, gave pt AVS...... KJ °

## 2014-04-07 ENCOUNTER — Encounter: Payer: Self-pay | Admitting: Hematology

## 2014-04-08 ENCOUNTER — Other Ambulatory Visit: Payer: Self-pay

## 2014-04-08 DIAGNOSIS — C19 Malignant neoplasm of rectosigmoid junction: Secondary | ICD-10-CM

## 2014-04-09 ENCOUNTER — Ambulatory Visit (HOSPITAL_BASED_OUTPATIENT_CLINIC_OR_DEPARTMENT_OTHER): Payer: Medicaid Other

## 2014-04-09 ENCOUNTER — Other Ambulatory Visit (HOSPITAL_BASED_OUTPATIENT_CLINIC_OR_DEPARTMENT_OTHER): Payer: Medicaid Other

## 2014-04-09 VITALS — BP 125/57 | HR 84 | Temp 98.2°F | Resp 18

## 2014-04-09 DIAGNOSIS — C19 Malignant neoplasm of rectosigmoid junction: Secondary | ICD-10-CM

## 2014-04-09 DIAGNOSIS — Z5111 Encounter for antineoplastic chemotherapy: Secondary | ICD-10-CM

## 2014-04-09 LAB — COMPREHENSIVE METABOLIC PANEL (CC13)
ALT: 10 U/L (ref 0–55)
AST: 16 U/L (ref 5–34)
Albumin: 3.3 g/dL — ABNORMAL LOW (ref 3.5–5.0)
Alkaline Phosphatase: 74 U/L (ref 40–150)
Anion Gap: 5 mEq/L (ref 3–11)
BILIRUBIN TOTAL: 0.21 mg/dL (ref 0.20–1.20)
BUN: 10.2 mg/dL (ref 7.0–26.0)
CALCIUM: 11.5 mg/dL — AB (ref 8.4–10.4)
CO2: 27 meq/L (ref 22–29)
CREATININE: 0.8 mg/dL (ref 0.6–1.1)
Chloride: 107 mEq/L (ref 98–109)
Glucose: 64 mg/dl — ABNORMAL LOW (ref 70–140)
Potassium: 4.1 mEq/L (ref 3.5–5.1)
Sodium: 139 mEq/L (ref 136–145)
Total Protein: 8.1 g/dL (ref 6.4–8.3)

## 2014-04-09 LAB — CBC WITH DIFFERENTIAL/PLATELET
BASO%: 0.4 % (ref 0.0–2.0)
BASOS ABS: 0 10*3/uL (ref 0.0–0.1)
EOS%: 1.8 % (ref 0.0–7.0)
Eosinophils Absolute: 0.1 10*3/uL (ref 0.0–0.5)
HEMATOCRIT: 33.5 % — AB (ref 34.8–46.6)
HEMOGLOBIN: 10.8 g/dL — AB (ref 11.6–15.9)
LYMPH%: 26.9 % (ref 14.0–49.7)
MCH: 28.4 pg (ref 25.1–34.0)
MCHC: 32.2 g/dL (ref 31.5–36.0)
MCV: 88.2 fL (ref 79.5–101.0)
MONO#: 0.4 10*3/uL (ref 0.1–0.9)
MONO%: 7.1 % (ref 0.0–14.0)
NEUT#: 3.1 10*3/uL (ref 1.5–6.5)
NEUT%: 63.8 % (ref 38.4–76.8)
PLATELETS: 275 10*3/uL (ref 145–400)
RBC: 3.8 10*6/uL (ref 3.70–5.45)
RDW: 13.8 % (ref 11.2–14.5)
WBC: 4.9 10*3/uL (ref 3.9–10.3)
lymph#: 1.3 10*3/uL (ref 0.9–3.3)

## 2014-04-09 MED ORDER — ATROPINE SULFATE 1 MG/ML IJ SOLN
0.5000 mg | Freq: Once | INTRAMUSCULAR | Status: AC | PRN
  Administered 2014-04-09: 11:00:00 via INTRAVENOUS

## 2014-04-09 MED ORDER — DEXAMETHASONE SODIUM PHOSPHATE 20 MG/5ML IJ SOLN
INTRAMUSCULAR | Status: AC
Start: 2014-04-09 — End: 2014-04-09
  Filled 2014-04-09: qty 5

## 2014-04-09 MED ORDER — ONDANSETRON 16 MG/50ML IVPB (CHCC)
16.0000 mg | Freq: Once | INTRAVENOUS | Status: AC
  Administered 2014-04-09: 16 mg via INTRAVENOUS

## 2014-04-09 MED ORDER — DEXAMETHASONE SODIUM PHOSPHATE 20 MG/5ML IJ SOLN
20.0000 mg | Freq: Once | INTRAMUSCULAR | Status: AC
  Administered 2014-04-09: 20 mg via INTRAVENOUS

## 2014-04-09 MED ORDER — ONDANSETRON 16 MG/50ML IVPB (CHCC)
INTRAVENOUS | Status: AC
  Filled 2014-04-09: qty 16

## 2014-04-09 MED ORDER — IRINOTECAN HCL CHEMO INJECTION 100 MG/5ML
180.0000 mg/m2 | Freq: Once | INTRAVENOUS | Status: AC
  Administered 2014-04-09: 306 mg via INTRAVENOUS
  Filled 2014-04-09: qty 15.3

## 2014-04-09 MED ORDER — SODIUM CHLORIDE 0.9 % IV SOLN
Freq: Once | INTRAVENOUS | Status: AC
  Administered 2014-04-09: 10:00:00 via INTRAVENOUS

## 2014-04-09 MED ORDER — SODIUM CHLORIDE 0.9 % IV SOLN
2400.0000 mg/m2 | INTRAVENOUS | Status: DC
  Administered 2014-04-09: 4300 mg via INTRAVENOUS
  Filled 2014-04-09: qty 86

## 2014-04-09 MED ORDER — DEXTROSE 5 % IV SOLN
700.0000 mg | Freq: Once | INTRAVENOUS | Status: AC
  Administered 2014-04-09: 700 mg via INTRAVENOUS
  Filled 2014-04-09: qty 35

## 2014-04-09 MED ORDER — ATROPINE SULFATE 1 MG/ML IJ SOLN
INTRAMUSCULAR | Status: AC
  Filled 2014-04-09: qty 1

## 2014-04-09 MED ORDER — FLUOROURACIL CHEMO INJECTION 2.5 GM/50ML
400.0000 mg/m2 | Freq: Once | INTRAVENOUS | Status: AC
  Administered 2014-04-09: 700 mg via INTRAVENOUS
  Filled 2014-04-09: qty 14

## 2014-04-09 NOTE — Patient Instructions (Signed)
[place] Discharge Instructions for Patients Receiving Chemotherapy  Today you received the following chemotherapy agents Irinotecan, 5FU, Leucovorin.   To help prevent nausea and vomiting after your treatment, we encourage you to take your nausea medication as prescribed.   If you develop nausea and vomiting that is not controlled by your nausea medication, call the clinic.   BELOW ARE SYMPTOMS THAT SHOULD BE REPORTED IMMEDIATELY:  *FEVER GREATER THAN 100.5 F  *CHILLS WITH OR WITHOUT FEVER  NAUSEA AND VOMITING THAT IS NOT CONTROLLED WITH YOUR NAUSEA MEDICATION  *UNUSUAL SHORTNESS OF BREATH  *UNUSUAL BRUISING OR BLEEDING  TENDERNESS IN MOUTH AND THROAT WITH OR WITHOUT PRESENCE OF ULCERS  *URINARY PROBLEMS  *BOWEL PROBLEMS  UNUSUAL RASH Items with * indicate a potential emergency and should be followed up as soon as possible.  Feel free to call the clinic you have any questions or concerns. The clinic phone number is [phone].

## 2014-04-11 ENCOUNTER — Ambulatory Visit: Payer: Medicaid Other

## 2014-04-11 ENCOUNTER — Ambulatory Visit (HOSPITAL_BASED_OUTPATIENT_CLINIC_OR_DEPARTMENT_OTHER): Payer: Medicaid Other

## 2014-04-11 VITALS — BP 108/53 | HR 90 | Temp 97.2°F

## 2014-04-11 DIAGNOSIS — C19 Malignant neoplasm of rectosigmoid junction: Secondary | ICD-10-CM

## 2014-04-11 MED ORDER — HEPARIN SOD (PORK) LOCK FLUSH 100 UNIT/ML IV SOLN
500.0000 [IU] | Freq: Once | INTRAVENOUS | Status: AC | PRN
  Administered 2014-04-11: 500 [IU]
  Filled 2014-04-11: qty 5

## 2014-04-11 MED ORDER — SODIUM CHLORIDE 0.9 % IJ SOLN
10.0000 mL | INTRAMUSCULAR | Status: DC | PRN
  Administered 2014-04-11: 10 mL
  Filled 2014-04-11: qty 10

## 2014-04-11 NOTE — Progress Notes (Signed)
Patient in for Pump disconnect today. Patient denies any pain or discomfort at this time. Port flushed with 10 mL of Normal Saline and 5 mL of Heparin without any difficulty. No injection needed today per Pharmacy. Patient ambulating and discharged from flush room without any difficulty.

## 2014-04-15 ENCOUNTER — Other Ambulatory Visit: Payer: Self-pay | Admitting: Internal Medicine

## 2014-04-22 ENCOUNTER — Other Ambulatory Visit: Payer: Self-pay | Admitting: *Deleted

## 2014-04-22 DIAGNOSIS — C19 Malignant neoplasm of rectosigmoid junction: Secondary | ICD-10-CM

## 2014-04-22 MED ORDER — LORAZEPAM 0.5 MG PO TABS: 0.5000 mg | ORAL_TABLET | Freq: Three times a day (TID) | ORAL | Status: DC | PRN

## 2014-04-29 ENCOUNTER — Telehealth: Payer: Self-pay | Admitting: Hematology

## 2014-04-29 ENCOUNTER — Telehealth: Payer: Self-pay | Admitting: *Deleted

## 2014-04-29 ENCOUNTER — Ambulatory Visit (HOSPITAL_BASED_OUTPATIENT_CLINIC_OR_DEPARTMENT_OTHER): Payer: Medicaid Other | Admitting: Hematology

## 2014-04-29 ENCOUNTER — Ambulatory Visit (HOSPITAL_BASED_OUTPATIENT_CLINIC_OR_DEPARTMENT_OTHER): Payer: Medicaid Other

## 2014-04-29 ENCOUNTER — Other Ambulatory Visit (HOSPITAL_BASED_OUTPATIENT_CLINIC_OR_DEPARTMENT_OTHER): Payer: Medicaid Other

## 2014-04-29 ENCOUNTER — Ambulatory Visit: Payer: Medicaid Other

## 2014-04-29 VITALS — BP 121/73 | HR 86 | Temp 98.7°F | Resp 18 | Ht 64.0 in | Wt 155.0 lb

## 2014-04-29 DIAGNOSIS — C7889 Secondary malignant neoplasm of other digestive organs: Secondary | ICD-10-CM

## 2014-04-29 DIAGNOSIS — C19 Malignant neoplasm of rectosigmoid junction: Secondary | ICD-10-CM

## 2014-04-29 DIAGNOSIS — C189 Malignant neoplasm of colon, unspecified: Secondary | ICD-10-CM

## 2014-04-29 DIAGNOSIS — Z5111 Encounter for antineoplastic chemotherapy: Secondary | ICD-10-CM

## 2014-04-29 DIAGNOSIS — Z95828 Presence of other vascular implants and grafts: Secondary | ICD-10-CM

## 2014-04-29 DIAGNOSIS — C7800 Secondary malignant neoplasm of unspecified lung: Secondary | ICD-10-CM

## 2014-04-29 LAB — CBC WITH DIFFERENTIAL/PLATELET
BASO%: 0.9 % (ref 0.0–2.0)
BASOS ABS: 0 10*3/uL (ref 0.0–0.1)
EOS ABS: 0.1 10*3/uL (ref 0.0–0.5)
EOS%: 2.6 % (ref 0.0–7.0)
HCT: 33.1 % — ABNORMAL LOW (ref 34.8–46.6)
HGB: 10.8 g/dL — ABNORMAL LOW (ref 11.6–15.9)
LYMPH#: 1.1 10*3/uL (ref 0.9–3.3)
LYMPH%: 20.5 % (ref 14.0–49.7)
MCH: 28.9 pg (ref 25.1–34.0)
MCHC: 32.5 g/dL (ref 31.5–36.0)
MCV: 88.8 fL (ref 79.5–101.0)
MONO#: 0.4 10*3/uL (ref 0.1–0.9)
MONO%: 8 % (ref 0.0–14.0)
NEUT%: 68 % (ref 38.4–76.8)
NEUTROS ABS: 3.6 10*3/uL (ref 1.5–6.5)
Platelets: 276 10*3/uL (ref 145–400)
RBC: 3.73 10*6/uL (ref 3.70–5.45)
RDW: 14.4 % (ref 11.2–14.5)
WBC: 5.2 10*3/uL (ref 3.9–10.3)

## 2014-04-29 LAB — COMPREHENSIVE METABOLIC PANEL (CC13)
ALBUMIN: 3.2 g/dL — AB (ref 3.5–5.0)
ALT: 12 U/L (ref 0–55)
AST: 17 U/L (ref 5–34)
Alkaline Phosphatase: 81 U/L (ref 40–150)
Anion Gap: 7 mEq/L (ref 3–11)
BUN: 11.2 mg/dL (ref 7.0–26.0)
CALCIUM: 12 mg/dL — AB (ref 8.4–10.4)
CHLORIDE: 111 meq/L — AB (ref 98–109)
CO2: 21 mEq/L — ABNORMAL LOW (ref 22–29)
Creatinine: 0.8 mg/dL (ref 0.6–1.1)
GLUCOSE: 99 mg/dL (ref 70–140)
POTASSIUM: 4 meq/L (ref 3.5–5.1)
Sodium: 139 mEq/L (ref 136–145)
TOTAL PROTEIN: 7.5 g/dL (ref 6.4–8.3)
Total Bilirubin: 0.2 mg/dL (ref 0.20–1.20)

## 2014-04-29 LAB — CEA: CEA: 3 ng/mL (ref 0.0–5.0)

## 2014-04-29 MED ORDER — ATROPINE SULFATE 1 MG/ML IJ SOLN
0.5000 mg | Freq: Once | INTRAMUSCULAR | Status: AC | PRN
  Administered 2014-04-29: 0.5 mg via INTRAVENOUS

## 2014-04-29 MED ORDER — ONDANSETRON 16 MG/50ML IVPB (CHCC)
INTRAVENOUS | Status: AC
  Filled 2014-04-29: qty 16

## 2014-04-29 MED ORDER — ONDANSETRON 16 MG/50ML IVPB (CHCC)
16.0000 mg | Freq: Once | INTRAVENOUS | Status: AC
  Administered 2014-04-29: 16 mg via INTRAVENOUS

## 2014-04-29 MED ORDER — DEXAMETHASONE SODIUM PHOSPHATE 20 MG/5ML IJ SOLN
20.0000 mg | Freq: Once | INTRAMUSCULAR | Status: AC
  Administered 2014-04-29: 20 mg via INTRAVENOUS

## 2014-04-29 MED ORDER — SODIUM CHLORIDE 0.9 % IV SOLN
2400.0000 mg/m2 | INTRAVENOUS | Status: DC
  Administered 2014-04-29: 4300 mg via INTRAVENOUS
  Filled 2014-04-29: qty 86

## 2014-04-29 MED ORDER — SODIUM CHLORIDE 0.9 % IJ SOLN
10.0000 mL | INTRAMUSCULAR | Status: DC | PRN
  Administered 2014-04-29: 10 mL via INTRAVENOUS
  Filled 2014-04-29: qty 10

## 2014-04-29 MED ORDER — PROCHLORPERAZINE MALEATE 10 MG PO TABS
ORAL_TABLET | ORAL | Status: AC
  Filled 2014-04-29: qty 1

## 2014-04-29 MED ORDER — LEUCOVORIN CALCIUM INJECTION 350 MG
700.0000 mg | Freq: Once | INTRAVENOUS | Status: AC
  Administered 2014-04-29: 700 mg via INTRAVENOUS
  Filled 2014-04-29: qty 35

## 2014-04-29 MED ORDER — IRINOTECAN HCL CHEMO INJECTION 100 MG/5ML
180.0000 mg/m2 | Freq: Once | INTRAVENOUS | Status: AC
  Administered 2014-04-29: 322 mg via INTRAVENOUS
  Filled 2014-04-29: qty 16.1

## 2014-04-29 MED ORDER — SODIUM CHLORIDE 0.9 % IV SOLN
Freq: Once | INTRAVENOUS | Status: AC
  Administered 2014-04-29: 10:00:00 via INTRAVENOUS

## 2014-04-29 MED ORDER — ATROPINE SULFATE 1 MG/ML IJ SOLN
INTRAMUSCULAR | Status: AC
  Filled 2014-04-29: qty 1

## 2014-04-29 MED ORDER — FLUOROURACIL CHEMO INJECTION 2.5 GM/50ML
400.0000 mg/m2 | Freq: Once | INTRAVENOUS | Status: AC
  Administered 2014-04-29: 700 mg via INTRAVENOUS
  Filled 2014-04-29: qty 14

## 2014-04-29 MED ORDER — DEXAMETHASONE SODIUM PHOSPHATE 20 MG/5ML IJ SOLN
INTRAMUSCULAR | Status: AC
  Filled 2014-04-29: qty 5

## 2014-04-29 MED ORDER — PROCHLORPERAZINE MALEATE 10 MG PO TABS
10.0000 mg | ORAL_TABLET | Freq: Once | ORAL | Status: AC
  Administered 2014-04-29: 10 mg via ORAL

## 2014-04-29 NOTE — Progress Notes (Signed)
[facility] PROGRESS NOTE DATE OF VISIT: 04/01/2014    DIAGNOSIS: Colon cancer - Plan: CBC with Differential, Comprehensive metabolic panel (Cmet) - [facility], CEA  Chief Complaint  Patient presents with  . Follow-up    CURRENT TREATMENT: Folfox q 14 weeks (07/09/2013 until 08/20/2013).  Switched to [facility] q 14 days due to mixed response (started on 09/13/2013).  After a chemoherapy break x 1 month, I will resume FOLFIRI regimen on 04/08/14 but will change the frequency to Every 3 weeks for better tolerance.    Colorectal cancer, stage IV   05/22/2013 - 05/27/2013 Hospital Admission A/w abdominal distention and constipation for 2 days CT scan of her abdomen and pelvis showed area of bowel wall thickening at the rectosigmoid junction concerning for malignancy.   05/22/2013 Imaging CT of Abdomen: bowel wall thickening with shouldering of the proximal and distal margins, involving therectosigmoid junction, supicous liver spleen mets. Colonsocopy recommended.    05/23/2013 Tumor Marker CEA 225.7   05/24/2013 Imaging MRI of abdomen: 3.7 x 4.4 cm enhancing lesion along the superior spleen, indeterminate but worrisome for metastasis.2.7 x 3.9 cm enhancing lesion in the medial segment left hepatic lobe,    05/25/2013 Pathology Results Diagnosis Rectum, biopsy, proximal - INVASIVE ADENOCARCINOMA.   05/25/2013 Procedure Flex sig: (Dr. Ardis Hughs). 5-6 cm obstructing rectosigmoid mass distal end 10 cm from anal verge; mass biopsed and then stented 9 cm long, 22 cm diameter uncovered SEM.    05/25/2013 Initial Diagnosis Colorectal cancer, stage IV   06/25/2013 Procedure R port a cath placement.   07/09/2013 - 08/20/2013 Chemotherapy FOLFOX q 2 weeks started.  Not elgible for clinical trial and bevacimab based on stent and high risk for perforation.    07/30/2013 Tumor Marker KRAS positive.  p53, APC identified. Mismatch Repair (MMR) preserved.    08/06/2013 Adverse Reaction Complains of  darkening of her hands with peeling.    08/27/2013 Family History She was seen by Genetic couselor.  She declined testing today, but will call and reschedule an appointment should she decide to pursue testing.   09/07/2013 Imaging CT abdomen: Mixed response to therapy. Response to therapy of hepatic metastasis. right hepatic hemangiomas are again identified. Other too small to characterize liver lesions are felt to be similar but are indeterminate.. Enlargement of a splenic lesions   09/13/2013 Tumor Marker CEA 33.5   09/13/2013 Treatment Plan Change Reviewed scans consistent with mixed response.  Switch to FOLFIRI.  Provided script for immodium.    10/29/2013 Adverse Reaction Patient reports intense abdominal cramping lasting the first few days on chemotherapy.  Starting Hyoscyamine 0.125 mg q 6 hours prn.    11/15/2013 - 11/15/2013 Hospital Admission Patient presented to ED with migrated stent.  Stent retrieved by Dr. Ardis Hughs.  Imaging negative for perforation.   Discussed in conference with referral to Dr. Zella Richer made.    11/23/2013 Imaging CT Chest. 1. Stable left lower lobe 5 mm pulmonary nodule. 2. Interval increase in size splenic mass is concerning formetastatic lesion.3. Interval calcification of the left hepatic lobe metastasis. Noevidence of active residual disease by CT.   12/10/2013 Tumor Marker CEA 8.3   01/14/2014 Tumor Marker CEA, 6.4   02/11/2014 Tumor Marker CEA, 5.0   02/25/2014 -  Chemotherapy Folfiri given and also given a chemotherapy Holiday for 1 month   02/25/2014 Tumor Marker CEA 4.5   04/01/2014 Tumor Marker CEA 4.8    INTERVAL HISTORY: [name] [name] 63 y.o. female with a history of  Stage IV colon cancer is here for follow-up.  She was last seen by Dr Juliann Mule on 02/25/14 and first visit with me.   Today, she reports her bowel movements are regular and that she is with minimal pain. She denies any fevers or chills. She also reports that her weight is stable with an adequate appetite.  She is very active and handles her basic, intermediate and advanced ADLs without difficulties.  She reports good energy and appetite.  She had a good family time while she was off chemotherapy and really does not want to go back on it. After a lot of discussion and talk, she agreed to resuming chemotherapy every 3 weeks so she gets an extra week off (instead of every 2 weeks) and we will resume that on 04/08/14.  She feels fine. We discussed that after giving her 2 more cycles of Folfiri, we will do a scan and then either gave her a chemotherapy break or holiday or switch her to Capecitabine monotherapy as maintenece therapy. She did have one episode of vomiting after last chemotherapy.  MEDICAL HISTORY: Past Medical History  Diagnosis Date  . Anxiety   . Colon cancer     dx'd 2014    INTERIM HISTORY: has Rectal mass; Colon obstruction; Hypercalcemia; Colorectal cancer, stage IV; Family history of colorectal cancer; Thrush, oral; Tachycardia; and Migrated colon stent on her problem list.    ALLERGIES:  is allergic to codeine.  MEDICATIONS: has a current medication list which includes the following prescription(s): lidocaine-prilocaine, loperamide, lorazepam, ondansetron, polyethylene glycol powder, and prochlorperazine, and the following Facility-Administered Medications: fluorouracil (ADRUCIL) 4,300 mg in sodium chloride 0.9 % 150 mL chemo infusion.  SURGICAL HISTORY:  Past Surgical History  Procedure Laterality Date  . Abdominal hysterectomy  2005  . Cesarean section  1976  . Flexible sigmoidoscopy N/A 05/25/2013    Procedure: FLEXIBLE SIGMOIDOSCOPY;  Surgeon: [name], MD;  Location: [facility];  Service: Endoscopy;  Laterality: N/A;  needs floro  . Colonic stent placement N/A 05/25/2013    Procedure: COLONIC STENT PLACEMENT;  Surgeon: [name], MD;  Location: [facility];  Service: Endoscopy;  Laterality: N/A;  . Portacath placement Right 06/25/2013    Procedure: ULTRA  SOUND GUIDED INSERTION PORT-A-CATH;  Surgeon: [name], MD;  Location: [facility];  Service: General;  Laterality: Right;    REVIEW OF SYSTEMS:   Constitutional: Denies fevers, chills or abnormal weight loss Eyes: Denies blurriness of vision Ears, nose, mouth, throat, and face: Denies mucositis or sore throat Respiratory: Denies cough, dyspnea or wheezes Cardiovascular: Denies palpitation, chest discomfort or lower extremity swelling Gastrointestinal:  Denies nausea, heartburn or change in bowel habits Skin: Denies abnormal skin rashes Lymphatics: Denies new lymphadenopathy or easy bruising Neurological:Denies numbness, tingling or new weaknesses Behavioral/Psych: Mood is stable, no new changes  All other systems were reviewed with the patient and are negative.  PHYSICAL EXAMINATION: ECOG PERFORMANCE STATUS: 0-Asymptomatic  Blood pressure 121/73, pulse 86, temperature 98.7 F (37.1 C), temperature source Oral, resp. rate 18, height '5\' 4"'  (1.626 m), weight 155 lb (70.308 kg), SpO2 100.00%.  GENERAL:alert, no distress and comfortable; well-developed, well nourished.  SKIN: skin color, texture, turgor are normal, no rashes or significant lesions; + R Port a cath without tenderness; hyperpigmentation of her hands.  EYES: normal, Conjunctiva are pink and non-injected, sclera clear  OROPHARYNX:no exudate, no erythema and lips, buccal mucosa  NECK: supple, thyroid normal size, non-tender, without nodularity  LYMPH: no palpable  lymphadenopathy in the cervical, axillary or supraclavicular  LUNGS: clear to auscultation and percussion with normal breathing effort  HEART: Tachycardic with regular rhythm and no murmurs and no lower extremity edema  ABDOMEN:abdomen soft, non-tender and normal bowel sounds Musculoskeletal:no cyanosis of digits and no clubbing  NEURO: alert & oriented x 3 with fluent speech, no focal motor/sensory deficits  Labs:  Lab Results  Component  Value Date   WBC 5.2 04/29/2014   HGB 10.8* 04/29/2014   HCT 33.1* 04/29/2014   MCV 88.8 04/29/2014   PLT 276 04/29/2014   NEUTROABS 3.6 04/29/2014      Chemistry      Component Value Date/Time   NA 139 04/29/2014 0821   NA 139 02/11/2014 0817   K 4.0 04/29/2014 0821   K 4.6 02/11/2014 0817   CL 104 02/11/2014 0817   CO2 21* 04/29/2014 0821   CO2 24 02/11/2014 0817   BUN 11.2 04/29/2014 0821   BUN 13 02/11/2014 0817   CREATININE 0.8 04/29/2014 0821   CREATININE 0.75 02/11/2014 0817      Component Value Date/Time   CALCIUM 12.0* 04/29/2014 0821   CALCIUM 11.7* 02/11/2014 0817   ALKPHOS 81 04/29/2014 0821   ALKPHOS 68 02/11/2014 0817   AST 17 04/29/2014 0821   AST 13 02/11/2014 0817   ALT 12 04/29/2014 0821   ALT 8 02/11/2014 0817   BILITOT 0.20 04/29/2014 0821   BILITOT <0.2* 02/11/2014 0817      RADIOGRAPHIC STUDIES:  CT CHEST W CONTRAST 11/23/13: IMPRESSION:  1. Stable left lower lobe 5 mm pulmonary nodule. 2. Interval increase in size splenic mass is concerning for metastatic lesion.  3. Interval calcification of the left hepatic lobe metastasis. No evidence of active residual disease by CT. 4. Stable benign hepatic hemangiomas.    ASSESSMENT: LEOLIA VINZANT 63 y.o. female with a history of STAGE 4 COLON cancer on palliative chemotherapy with FOLFIRI regimen and here after a 1 month break. She was admitted with obstructive mass s/p stent and biopsy on 05/25/13 consistent with metastatic colon cancer with mets to liver, spleen. ECOG 0. Elevated CEA.   PLAN:   1. 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  -- Today, she reports feeling well overall.  We changed her chemotherapy to FOLFIRI in February 2015.   There is no difference in overall response rate, time to progression and overall survival for patients treated with FOLFIRI versus FOLFOX4 in the treatment of advanced colorectal cancer. [facility], 2005, Aug 1;  23(22): 0762-26. For FOLFIRI and FOLFOX respectively, the median to to progression was 7 months versus 7 months; duration of response was 9 versus 10 months; overall survival was 14 versus 15 months and overall response rates were 31% versus 34%. Other potential therapies if progression is 5-FU/LV, CapeOx or capecitabine. We will continue FOLFIRI on 04/08/2014 q 21 days until progression or unacceptable toxicity.  It consists of the following:   Day # 1 Irinotecan 322 mg for 90 minutes  Leucovorin 400 mg/m2 for 2 hours before 5-FU d1  Fluorouracil 400 mg/m2 once starting 2.5 After treatment start time  Days #1/2 Fluorouracil 2,400 mg/m2 for 46 hours  Day #3 Pump D/C   The reference is from [place]. Al, Irinotecan combined with fluorouracil compared with fluororacil alone as first-line treatment for metastatic colorectal cancer: a multicenter randomised trial.  Lancet 2000 Apr 15; 355 (3335).  Her CEA from 07/13 was 5.0 last  visit and 4.5 in August.  Ct of abdomen was refused.  She got one month chemotherapy "holiday" at her request to facilitate travel next month on vacation and feeling better after that.  Now we will resume the FOLFIRI q 3 weeks. There was also discussion that after 3 months and doing another re-staging scan we can switch her to Xeloda and I will check with our social workers what would be the cost of Xeloda for patient?  2. S/p migrated colorectal stent, s/p retrieval on 05/14 --  We referred to Dr. Zella Richer for consideration of surgery if indicated and she had an appointment on 06/30.  She declined surgical intervention presently.  She will follow up with GI/Surgery as needed.    3. Family history of Colon cancer.  --Based on her age and family history (sister deceased at age 54 from colon cancer), referral for genetic testing for Lynch syndrome, i.e. MSI or IHC, would be appropriate. We made a referral to genetics. Her tumor was negative for lynch syndrome. She was seen  on 02/23 but declined further testing for genetic syndromes presently.   4. 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  --Parathyroid hormone elevated 192.5 and parathyroid hormone-related peptide pending. PTH is generally low when hypercalcemia is secondary to malignancy. In primary PTH, it is elevated. We counseled continue aggressive fluid hydration. She was instructed to report to emergency room with worsening constipation or confusion. We referred to endocrinology for further evaluation. She was seen by Dr. Dwyane Dee.  A NM Parathyroid w/spect/ct has been ordered by Dr Dwyane Dee.    5. Follow-up.  --Patient instructed to follow-up 05/20/14  for labs, symptom visit, and consideration of next chemotherapy cycle.   All questions were answered. The patient knows to call the clinic with any problems, questions or concerns. We can certainly see the patient much sooner if necessary.  I spent 30 minutes counseling the patient face to face. The total time spent in the appointment was 35 minutes.    Bernadene Bell, MD Medical Hematologist/Oncologist [place] Pager: [phone] Office No: [phone]

## 2014-04-29 NOTE — Patient Instructions (Signed)
[facility] Discharge Instructions for Patients Receiving Chemotherapy  Today you received the following chemotherapy agents : Irinotecan, Leucovorin, and Adrucil.  To help prevent nausea and vomiting after your treatment, we encourage you to take your nausea medication as prescribed.   If you develop nausea and vomiting that is not controlled by your nausea medication, call the clinic.   BELOW ARE SYMPTOMS THAT SHOULD BE REPORTED IMMEDIATELY:  *FEVER GREATER THAN 100.5 F  *CHILLS WITH OR WITHOUT FEVER  NAUSEA AND VOMITING THAT IS NOT CONTROLLED WITH YOUR NAUSEA MEDICATION  *UNUSUAL SHORTNESS OF BREATH  *UNUSUAL BRUISING OR BLEEDING  TENDERNESS IN MOUTH AND THROAT WITH OR WITHOUT PRESENCE OF ULCERS  *URINARY PROBLEMS  *BOWEL PROBLEMS  UNUSUAL RASH Items with * indicate a potential emergency and should be followed up as soon as possible.  Feel free to call the clinic you have any questions or concerns. The clinic phone number is [phone].    

## 2014-04-29 NOTE — Telephone Encounter (Signed)
Per staff message and POF I have scheduled appts. Advised scheduler of appts. JMW  

## 2014-04-29 NOTE — Patient Instructions (Signed)

## 2014-04-29 NOTE — Telephone Encounter (Signed)
Gave avs & appt d/t for Nov. Mess MW to sch tx to follow appts.

## 2014-05-01 ENCOUNTER — Ambulatory Visit (HOSPITAL_BASED_OUTPATIENT_CLINIC_OR_DEPARTMENT_OTHER): Payer: Medicaid Other

## 2014-05-01 ENCOUNTER — Other Ambulatory Visit: Payer: Self-pay | Admitting: *Deleted

## 2014-05-01 ENCOUNTER — Ambulatory Visit: Payer: Medicaid Other

## 2014-05-01 VITALS — BP 98/60 | HR 100 | Temp 97.5°F

## 2014-05-01 DIAGNOSIS — C19 Malignant neoplasm of rectosigmoid junction: Secondary | ICD-10-CM

## 2014-05-01 MED ORDER — SODIUM CHLORIDE 0.9 % IJ SOLN
10.0000 mL | INTRAMUSCULAR | Status: DC | PRN
  Administered 2014-05-01: 10 mL
  Filled 2014-05-01: qty 10

## 2014-05-01 MED ORDER — HEPARIN SOD (PORK) LOCK FLUSH 100 UNIT/ML IV SOLN
500.0000 [IU] | Freq: Once | INTRAVENOUS | Status: AC | PRN
  Administered 2014-05-01: 500 [IU]
  Filled 2014-05-01: qty 5

## 2014-05-01 NOTE — Progress Notes (Signed)
Patient in for Pump disconnect today. Patient denies any pain or discomfort at this time. Port flushed with 10 mL of Normal Saline and 5 mL of Heparin without any difficulty. No injection needed today per Pharmacy. Patient ambulating and discharged from flush room without any difficulty.

## 2014-05-02 ENCOUNTER — Other Ambulatory Visit: Payer: Self-pay | Admitting: Hematology

## 2014-05-20 ENCOUNTER — Telehealth: Payer: Self-pay | Admitting: Hematology

## 2014-05-20 ENCOUNTER — Other Ambulatory Visit: Payer: Self-pay

## 2014-05-20 ENCOUNTER — Ambulatory Visit (HOSPITAL_BASED_OUTPATIENT_CLINIC_OR_DEPARTMENT_OTHER): Payer: Medicaid Other | Admitting: Hematology

## 2014-05-20 ENCOUNTER — Other Ambulatory Visit (HOSPITAL_BASED_OUTPATIENT_CLINIC_OR_DEPARTMENT_OTHER): Payer: Medicaid Other

## 2014-05-20 ENCOUNTER — Telehealth: Payer: Self-pay | Admitting: *Deleted

## 2014-05-20 ENCOUNTER — Ambulatory Visit (HOSPITAL_BASED_OUTPATIENT_CLINIC_OR_DEPARTMENT_OTHER): Payer: Medicaid Other

## 2014-05-20 VITALS — BP 133/62 | HR 83 | Temp 98.3°F | Resp 18 | Ht 64.0 in | Wt 156.1 lb

## 2014-05-20 DIAGNOSIS — C19 Malignant neoplasm of rectosigmoid junction: Secondary | ICD-10-CM

## 2014-05-20 DIAGNOSIS — Z5111 Encounter for antineoplastic chemotherapy: Secondary | ICD-10-CM

## 2014-05-20 DIAGNOSIS — C787 Secondary malignant neoplasm of liver and intrahepatic bile duct: Secondary | ICD-10-CM

## 2014-05-20 DIAGNOSIS — C7889 Secondary malignant neoplasm of other digestive organs: Secondary | ICD-10-CM

## 2014-05-20 DIAGNOSIS — C189 Malignant neoplasm of colon, unspecified: Secondary | ICD-10-CM

## 2014-05-20 DIAGNOSIS — Z95828 Presence of other vascular implants and grafts: Secondary | ICD-10-CM

## 2014-05-20 LAB — COMPREHENSIVE METABOLIC PANEL (CC13)
ALK PHOS: 71 U/L (ref 40–150)
ALT: 8 U/L (ref 0–55)
AST: 13 U/L (ref 5–34)
Albumin: 3.3 g/dL — ABNORMAL LOW (ref 3.5–5.0)
Anion Gap: 8 mEq/L (ref 3–11)
BILIRUBIN TOTAL: 0.2 mg/dL (ref 0.20–1.20)
BUN: 10.3 mg/dL (ref 7.0–26.0)
CO2: 21 mEq/L — ABNORMAL LOW (ref 22–29)
Calcium: 11.3 mg/dL — ABNORMAL HIGH (ref 8.4–10.4)
Chloride: 107 mEq/L (ref 98–109)
Creatinine: 0.8 mg/dL (ref 0.6–1.1)
GLUCOSE: 84 mg/dL (ref 70–140)
Potassium: 3.8 mEq/L (ref 3.5–5.1)
SODIUM: 137 meq/L (ref 136–145)
TOTAL PROTEIN: 7.5 g/dL (ref 6.4–8.3)

## 2014-05-20 LAB — CBC WITH DIFFERENTIAL/PLATELET
BASO%: 0.5 % (ref 0.0–2.0)
Basophils Absolute: 0 10*3/uL (ref 0.0–0.1)
EOS ABS: 0.2 10*3/uL (ref 0.0–0.5)
EOS%: 3.7 % (ref 0.0–7.0)
HCT: 34.7 % — ABNORMAL LOW (ref 34.8–46.6)
HGB: 11 g/dL — ABNORMAL LOW (ref 11.6–15.9)
LYMPH%: 23.7 % (ref 14.0–49.7)
MCH: 27.9 pg (ref 25.1–34.0)
MCHC: 31.7 g/dL (ref 31.5–36.0)
MCV: 88.3 fL (ref 79.5–101.0)
MONO#: 0.5 10*3/uL (ref 0.1–0.9)
MONO%: 10 % (ref 0.0–14.0)
NEUT%: 62.1 % (ref 38.4–76.8)
NEUTROS ABS: 3.1 10*3/uL (ref 1.5–6.5)
PLATELETS: 285 10*3/uL (ref 145–400)
RBC: 3.93 10*6/uL (ref 3.70–5.45)
RDW: 13.9 % (ref 11.2–14.5)
WBC: 5 10*3/uL (ref 3.9–10.3)
lymph#: 1.2 10*3/uL (ref 0.9–3.3)

## 2014-05-20 LAB — CEA: CEA: 4.3 ng/mL (ref 0.0–5.0)

## 2014-05-20 MED ORDER — DEXTROSE 5 % IV SOLN
700.0000 mg | Freq: Once | INTRAVENOUS | Status: AC
  Administered 2014-05-20: 700 mg via INTRAVENOUS
  Filled 2014-05-20: qty 35

## 2014-05-20 MED ORDER — ONDANSETRON 16 MG/50ML IVPB (CHCC)
INTRAVENOUS | Status: AC
  Filled 2014-05-20: qty 16

## 2014-05-20 MED ORDER — ONDANSETRON 16 MG/50ML IVPB (CHCC)
16.0000 mg | Freq: Once | INTRAVENOUS | Status: AC
  Administered 2014-05-20: 16 mg via INTRAVENOUS

## 2014-05-20 MED ORDER — ATROPINE SULFATE 1 MG/ML IJ SOLN
INTRAMUSCULAR | Status: AC
  Filled 2014-05-20: qty 1

## 2014-05-20 MED ORDER — SODIUM CHLORIDE 0.9 % IV SOLN
Freq: Once | INTRAVENOUS | Status: AC
  Administered 2014-05-20: 11:00:00 via INTRAVENOUS

## 2014-05-20 MED ORDER — DEXTROSE 5 % IV SOLN
180.0000 mg/m2 | Freq: Once | INTRAVENOUS | Status: AC
  Administered 2014-05-20: 322 mg via INTRAVENOUS
  Filled 2014-05-20: qty 16.1

## 2014-05-20 MED ORDER — ONDANSETRON HCL 8 MG PO TABS: ORAL_TABLET | ORAL | Status: DC

## 2014-05-20 MED ORDER — ATROPINE SULFATE 1 MG/ML IJ SOLN
0.5000 mg | Freq: Once | INTRAMUSCULAR | Status: AC | PRN
  Administered 2014-05-20: 0.5 mg via INTRAVENOUS

## 2014-05-20 MED ORDER — SODIUM CHLORIDE 0.9 % IJ SOLN
10.0000 mL | INTRAMUSCULAR | Status: DC | PRN
  Administered 2014-05-20: 10 mL via INTRAVENOUS
  Filled 2014-05-20: qty 10

## 2014-05-20 MED ORDER — FLUOROURACIL CHEMO INJECTION 5 GM/100ML
2400.0000 mg/m2 | INTRAVENOUS | Status: DC
  Administered 2014-05-20: 4300 mg via INTRAVENOUS
  Filled 2014-05-20: qty 86

## 2014-05-20 MED ORDER — DEXAMETHASONE SODIUM PHOSPHATE 20 MG/5ML IJ SOLN
INTRAMUSCULAR | Status: AC
  Filled 2014-05-20: qty 5

## 2014-05-20 MED ORDER — FLUOROURACIL CHEMO INJECTION 2.5 GM/50ML
400.0000 mg/m2 | Freq: Once | INTRAVENOUS | Status: AC
  Administered 2014-05-20: 700 mg via INTRAVENOUS
  Filled 2014-05-20: qty 14

## 2014-05-20 MED ORDER — DEXAMETHASONE SODIUM PHOSPHATE 20 MG/5ML IJ SOLN
20.0000 mg | Freq: Once | INTRAMUSCULAR | Status: AC
  Administered 2014-05-20: 20 mg via INTRAVENOUS

## 2014-05-20 NOTE — Patient Instructions (Signed)

## 2014-05-20 NOTE — Telephone Encounter (Signed)
Gave avs & cal for Dec. Sent Mess to sch tx.

## 2014-05-20 NOTE — Patient Instructions (Signed)
[facility] Discharge Instructions for Patients Receiving Chemotherapy  Today you received the following chemotherapy agents FOLFIRI.  To help prevent nausea and vomiting after your treatment, we encourage you to take your nausea medication as directed.    If you develop nausea and vomiting that is not controlled by your nausea medication, call the clinic.   BELOW ARE SYMPTOMS THAT SHOULD BE REPORTED IMMEDIATELY:  *FEVER GREATER THAN 100.5 F  *CHILLS WITH OR WITHOUT FEVER  NAUSEA AND VOMITING THAT IS NOT CONTROLLED WITH YOUR NAUSEA MEDICATION  *UNUSUAL SHORTNESS OF BREATH  *UNUSUAL BRUISING OR BLEEDING  TENDERNESS IN MOUTH AND THROAT WITH OR WITHOUT PRESENCE OF ULCERS  *URINARY PROBLEMS  *BOWEL PROBLEMS  UNUSUAL RASH Items with * indicate a potential emergency and should be followed up as soon as possible.  Feel free to call the clinic you have any questions or concerns. The clinic phone number is [phone].    

## 2014-05-20 NOTE — Progress Notes (Signed)
[place] ONCOLOGY OFFICE PROGRESS NOTE DATE OF VISIT: 05/20/2014    DIAGNOSIS: Colon cancer - Plan: CBC with Differential, Comprehensive metabolic panel (Cmet) - [facility], CEA  Chief Complaint  Patient presents with  . Follow-up    CURRENT TREATMENT: Folfox q 14 weeks (07/09/2013 until 08/20/2013).  Switched to [facility] q 14 days due to mixed response (started on 09/13/2013).  After a chemoherapy break x 1 month, I will resume FOLFIRI regimen on 04/08/14 but changed the frequency to Every 3 weeks for better tolerance. Also it was discussed that after 2 more cycles and a scan if we can switch her to Oral Xeloda Regimen as maintenance therapy.    Colorectal cancer, stage IV   05/22/2013 - 05/27/2013 Hospital Admission A/w abdominal distention and constipation for 2 days CT scan of her abdomen and pelvis showed area of bowel wall thickening at the rectosigmoid junction concerning for malignancy.   05/22/2013 Imaging CT of Abdomen: bowel wall thickening with shouldering of the proximal and distal margins, involving therectosigmoid junction, supicous liver spleen mets. Colonsocopy recommended.    05/23/2013 Tumor Marker CEA 225.7   05/24/2013 Imaging MRI of abdomen: 3.7 x 4.4 cm enhancing lesion along the superior spleen, indeterminate but worrisome for metastasis.2.7 x 3.9 cm enhancing lesion in the medial segment left hepatic lobe,    05/25/2013 Pathology Results Diagnosis Rectum, biopsy, proximal - INVASIVE ADENOCARCINOMA.   05/25/2013 Procedure Flex sig: (Dr. Ardis Hughs). 5-6 cm obstructing rectosigmoid mass distal end 10 cm from anal verge; mass biopsed and then stented 9 cm long, 22 cm diameter uncovered SEM.    05/25/2013 Initial Diagnosis Colorectal cancer, stage IV   06/25/2013 Procedure R port a cath placement.   07/09/2013 - 08/20/2013 Chemotherapy FOLFOX q 2 weeks started.  Not elgible for clinical trial and bevacimab based on stent and high risk for perforation.    07/30/2013 Tumor  Marker KRAS positive.  p53, APC identified. Mismatch Repair (MMR) preserved.    08/06/2013 Adverse Reaction Complains of darkening of her hands with peeling.    08/27/2013 Family History She was seen by Genetic couselor.  She declined testing today, but will call and reschedule an appointment should she decide to pursue testing.   09/07/2013 Imaging CT abdomen: Mixed response to therapy. Response to therapy of hepatic metastasis. right hepatic hemangiomas are again identified. Other too small to characterize liver lesions are felt to be similar but are indeterminate.. Enlargement of a splenic lesions   09/13/2013 Tumor Marker CEA 33.5   09/13/2013 Treatment Plan Change Reviewed scans consistent with mixed response.  Switch to FOLFIRI.  Provided script for immodium.    10/29/2013 Adverse Reaction Patient reports intense abdominal cramping lasting the first few days on chemotherapy.  Starting Hyoscyamine 0.125 mg q 6 hours prn.    11/15/2013 - 11/15/2013 Hospital Admission Patient presented to ED with migrated stent.  Stent retrieved by Dr. Ardis Hughs.  Imaging negative for perforation.   Discussed in conference with referral to Dr. Zella Richer made.    11/23/2013 Imaging CT Chest. 1. Stable left lower lobe 5 mm pulmonary nodule. 2. Interval increase in size splenic mass is concerning formetastatic lesion.3. Interval calcification of the left hepatic lobe metastasis. Noevidence of active residual disease by CT.   12/10/2013 Tumor Marker CEA 8.3   01/14/2014 Tumor Marker CEA, 6.4   02/11/2014 Tumor Marker CEA, 5.0   02/25/2014 -  Chemotherapy Folfiri given and also given a chemotherapy Holiday for 1 month   02/25/2014 Tumor Marker CEA  4.5   04/01/2014 Tumor Marker CEA 4.8   04/29/2014 Tumor Marker CEA 3.0   05/20/2014 -  Chemotherapy folfiri today    INTERVAL HISTORY:  [name] [name] 63 y.o. female with a history of Stage IV colon cancer is here for follow-up.  She was last seen by me on 04/29/2014. Today, she reports  her bowel movements are regular and that she is with minimal pain. She denies any fevers or chills. She also reports that her weight is stable with an adequate appetite. She is very active and handles her basic, intermediate and advanced ADLs without difficulties.  She reports good energy and appetite.  She had a good family time while she was off chemotherapy and really does not want to go back on it. After a lot of discussion and talk, she agreed to resuming chemotherapy every 3 weeks so she gets an extra week off (instead of every 2 weeks) and we will resume that on 04/08/14.  She feels fine. We discussed that after giving her 2 more cycles of Folfiri, we will do a scan and then either gave her a chemotherapy break or holiday or switch her to Capecitabine monotherapy as maintenece therapy.   MEDICAL HISTORY: Past Medical History  Diagnosis Date  . Anxiety   . Colon cancer     dx'd 2014    INTERIM HISTORY: has Rectal mass; Colon obstruction; Hypercalcemia; Colorectal cancer, stage IV; Family history of colorectal cancer; Thrush, oral; Tachycardia; and Migrated colon stent on her problem list.    ALLERGIES:  is allergic to codeine.  MEDICATIONS: has a current medication list which includes the following prescription(s): lidocaine-prilocaine, loperamide, lorazepam, polyethylene glycol powder, prochlorperazine, and ondansetron.  SURGICAL HISTORY:  Past Surgical History  Procedure Laterality Date  . Abdominal hysterectomy  2005  . Cesarean section  1976  . Flexible sigmoidoscopy N/A 05/25/2013    Procedure: FLEXIBLE SIGMOIDOSCOPY;  Surgeon: [name], MD;  Location: [facility];  Service: Endoscopy;  Laterality: N/A;  needs floro  . Colonic stent placement N/A 05/25/2013    Procedure: COLONIC STENT PLACEMENT;  Surgeon: [name], MD;  Location: [facility];  Service: Endoscopy;  Laterality: N/A;  . Portacath placement Right 06/25/2013    Procedure: ULTRA SOUND GUIDED INSERTION  PORT-A-CATH;  Surgeon: [name], MD;  Location: [facility];  Service: General;  Laterality: Right;    REVIEW OF SYSTEMS:   Constitutional: Denies fevers, chills or abnormal weight loss Eyes: Denies blurriness of vision Ears, nose, mouth, throat, and face: Denies mucositis or sore throat Respiratory: Denies cough, dyspnea or wheezes Cardiovascular: Denies palpitation, chest discomfort or lower extremity swelling Gastrointestinal:  Denies nausea, heartburn or change in bowel habits Skin: Denies abnormal skin rashes Lymphatics: Denies new lymphadenopathy or easy bruising Neurological:Denies numbness, tingling or new weaknesses Behavioral/Psych: Mood is stable, no new changes  All other systems were reviewed with the patient and are negative.  PHYSICAL EXAMINATION: ECOG PERFORMANCE STATUS: 0-Asymptomatic  Blood pressure 133/62, pulse 83, temperature 98.3 F (36.8 C), temperature source Oral, resp. rate 18, height '5\' 4"'  (1.626 m), weight 156 lb 1.6 oz (70.806 kg), SpO2 100 %.  GENERAL:alert, no distress and comfortable; well-developed, well nourished.  SKIN: skin color, texture, turgor are normal, no rashes or significant lesions; + R Port a cath without tenderness; hyperpigmentation of her hands.  EYES: normal, Conjunctiva are pink and non-injected, sclera clear  OROPHARYNX:no exudate, no erythema and lips, buccal mucosa  NECK: supple, thyroid normal size,  non-tender, without nodularity  LYMPH: no palpable lymphadenopathy in the cervical, axillary or supraclavicular  LUNGS: clear to auscultation and percussion with normal breathing effort  HEART: Tachycardic with regular rhythm and no murmurs and no lower extremity edema  ABDOMEN:abdomen soft, non-tender and normal bowel sounds Musculoskeletal:no cyanosis of digits and no clubbing  NEURO: alert & oriented x 3 with fluent speech, no focal motor/sensory deficits  Labs:  Lab Results  Component Value Date   WBC  5.0 05/20/2014   HGB 11.0* 05/20/2014   HCT 34.7* 05/20/2014   MCV 88.3 05/20/2014   PLT 285 05/20/2014   NEUTROABS 3.1 05/20/2014      Chemistry      Component Value Date/Time   NA 137 05/20/2014 0823   NA 139 02/11/2014 0817   K 3.8 05/20/2014 0823   K 4.6 02/11/2014 0817   CL 104 02/11/2014 0817   CO2 21* 05/20/2014 0823   CO2 24 02/11/2014 0817   BUN 10.3 05/20/2014 0823   BUN 13 02/11/2014 0817   CREATININE 0.8 05/20/2014 0823   CREATININE 0.75 02/11/2014 0817      Component Value Date/Time   CALCIUM 11.3* 05/20/2014 0823   CALCIUM 11.7* 02/11/2014 0817   ALKPHOS 71 05/20/2014 0823   ALKPHOS 68 02/11/2014 0817   AST 13 05/20/2014 0823   AST 13 02/11/2014 0817   ALT 8 05/20/2014 0823   ALT 8 02/11/2014 0817   BILITOT 0.20 05/20/2014 0823   BILITOT <0.2* 02/11/2014 0817      RADIOGRAPHIC STUDIES:  CT CHEST W CONTRAST 11/23/13: IMPRESSION:  1. Stable left lower lobe 5 mm pulmonary nodule. 2. Interval increase in size splenic mass is concerning for metastatic lesion.  3. Interval calcification of the left hepatic lobe metastasis. No evidence of active residual disease by CT. 4. Stable benign hepatic hemangiomas.    ASSESSMENT: [name] [name] 63 y.o. female with a history of STAGE 4 COLON cancer on palliative chemotherapy with FOLFIRI regimen and here after a 1 month break. She was admitted with obstructive mass s/p stent and biopsy on 05/25/13 consistent with metastatic colon cancer with mets to liver, spleen. ECOG 0. Elevated CEA.   PLAN:   1. 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  -- Today, she reports feeling well overall.  We changed her chemotherapy to FOLFIRI in February 2015.   There is no difference in overall response rate, time to progression and overall survival for patients treated with FOLFIRI versus FOLFOX4 in the treatment of advanced colorectal cancer. [facility], 2005, Aug 1; 23(22):  5697-94. For FOLFIRI and FOLFOX respectively, the median to to progression was 7 months versus 7 months; duration of response was 9 versus 10 months; overall survival was 14 versus 15 months and overall response rates were 31% versus 34%. Other potential therapies if progression is 5-FU/LV, CapeOx or capecitabine. We will continue FOLFIRI on 04/08/2014 q 21 days until progression or unacceptable toxicity.  It consists of the following:   Day # 1 Irinotecan 322 mg for 90 minutes  Leucovorin 400 mg/m2 for 2 hours before 5-FU d1  Fluorouracil 400 mg/m2 once starting 2.5 After treatment start time  Days #1/2 Fluorouracil 2,400 mg/m2 for 46 hours  Day #3 Pump D/C   The reference is from [place]. Al, Irinotecan combined with fluorouracil compared with fluororacil alone as first-line treatment for metastatic colorectal cancer: a multicenter randomised trial.  Lancet 2000 Apr 15; 355 (8016).  Her CEA from 07/13 was 5.0 last visit and 4.5 in August.  Ct of abdomen was refused.  She got one month chemotherapy "holiday" at her request to facilitate travel next month on vacation and feeling better after that.  Now we will resume the FOLFIRI q 3 weeks. There was also discussion that after 3 months and doing another re-staging scan we can switch her to Xeloda.    2. S/p migrated colorectal stent, s/p retrieval on 05/14 --  We referred to Dr. Zella Richer for consideration of surgery if indicated and she had an appointment on 06/30.  She declined surgical intervention presently.  She will follow up with GI/Surgery as needed.    3. Family history of Colon cancer.  --Based on her age and family history (sister deceased at age 16 from colon cancer), referral for genetic testing for Lynch syndrome, i.e. MSI or IHC, would be appropriate. We made a referral to genetics. Her tumor was negative for lynch syndrome. She was seen on 02/23 but declined further testing for genetic syndromes presently.   4. 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  --Parathyroid hormone elevated 192.5 and parathyroid hormone-related peptide pending. PTH is generally low when hypercalcemia is secondary to malignancy. In primary PTH, it is elevated. We counseled continue aggressive fluid hydration. She was instructed to report to emergency room with worsening constipation or confusion. We referred to endocrinology for further evaluation. She was seen by Dr. Dwyane Dee.  A NM Parathyroid w/spect/ct has been ordered by Dr Dwyane Dee. Calcium is 11.3 today down from 12.0.   5. Follow-up.  --Patient instructed to follow-up 06/10/14  for labs, symptom visit, and consideration of next chemotherapy cycle.   All questions were answered. The patient knows to call the clinic with any problems, questions or concerns. We can certainly see the patient much sooner if necessary.  I spent 30 minutes counseling the patient face to face. The total time spent in the appointment was 35 minutes.    Bernadene Bell, MD Medical Hematologist/Oncologist [place] Pager: [phone] Office No: [phone]

## 2014-05-20 NOTE — Telephone Encounter (Signed)
Per staff message and POF I have scheduled appts. Advised scheduler of appts. JMW  

## 2014-05-22 ENCOUNTER — Ambulatory Visit (HOSPITAL_BASED_OUTPATIENT_CLINIC_OR_DEPARTMENT_OTHER): Payer: Medicaid Other

## 2014-05-22 DIAGNOSIS — Z452 Encounter for adjustment and management of vascular access device: Secondary | ICD-10-CM

## 2014-05-22 DIAGNOSIS — C19 Malignant neoplasm of rectosigmoid junction: Secondary | ICD-10-CM

## 2014-05-22 MED ORDER — SODIUM CHLORIDE 0.9 % IJ SOLN
10.0000 mL | INTRAMUSCULAR | Status: DC | PRN
  Administered 2014-05-22: 10 mL
  Filled 2014-05-22: qty 10

## 2014-05-22 MED ORDER — HEPARIN SOD (PORK) LOCK FLUSH 100 UNIT/ML IV SOLN
500.0000 [IU] | Freq: Once | INTRAVENOUS | Status: AC | PRN
  Administered 2014-05-22: 500 [IU]
  Filled 2014-05-22: qty 5

## 2014-06-07 MED ORDER — DEXAMETHASONE SODIUM PHOSPHATE 20 MG/5ML IJ SOLN
INTRAMUSCULAR | Status: AC
  Filled 2014-06-07: qty 5

## 2014-06-07 MED ORDER — FAMOTIDINE IN NACL 20-0.9 MG/50ML-% IV SOLN
INTRAVENOUS | Status: AC
  Filled 2014-06-07: qty 50

## 2014-06-07 MED ORDER — DIPHENHYDRAMINE HCL 50 MG/ML IJ SOLN
INTRAMUSCULAR | Status: AC
  Filled 2014-06-07: qty 1

## 2014-06-07 MED ORDER — ONDANSETRON 8 MG/NS 50 ML IVPB
INTRAVENOUS | Status: AC
  Filled 2014-06-07: qty 8

## 2014-06-10 ENCOUNTER — Other Ambulatory Visit (HOSPITAL_BASED_OUTPATIENT_CLINIC_OR_DEPARTMENT_OTHER): Payer: Medicaid Other

## 2014-06-10 ENCOUNTER — Encounter: Payer: Self-pay | Admitting: Hematology

## 2014-06-10 ENCOUNTER — Ambulatory Visit (HOSPITAL_BASED_OUTPATIENT_CLINIC_OR_DEPARTMENT_OTHER): Payer: Medicaid Other | Admitting: Hematology

## 2014-06-10 ENCOUNTER — Ambulatory Visit (HOSPITAL_BASED_OUTPATIENT_CLINIC_OR_DEPARTMENT_OTHER): Payer: Medicaid Other

## 2014-06-10 ENCOUNTER — Telehealth: Payer: Self-pay | Admitting: Hematology

## 2014-06-10 ENCOUNTER — Ambulatory Visit: Payer: Medicaid Other | Admitting: Nurse Practitioner

## 2014-06-10 ENCOUNTER — Other Ambulatory Visit: Payer: Self-pay | Admitting: Hematology

## 2014-06-10 VITALS — BP 127/65 | HR 75 | Temp 98.4°F | Resp 18 | Ht 64.0 in | Wt 154.3 lb

## 2014-06-10 DIAGNOSIS — C189 Malignant neoplasm of colon, unspecified: Secondary | ICD-10-CM

## 2014-06-10 DIAGNOSIS — C7989 Secondary malignant neoplasm of other specified sites: Secondary | ICD-10-CM

## 2014-06-10 DIAGNOSIS — Z5111 Encounter for antineoplastic chemotherapy: Secondary | ICD-10-CM

## 2014-06-10 DIAGNOSIS — Z452 Encounter for adjustment and management of vascular access device: Secondary | ICD-10-CM

## 2014-06-10 DIAGNOSIS — C787 Secondary malignant neoplasm of liver and intrahepatic bile duct: Secondary | ICD-10-CM

## 2014-06-10 DIAGNOSIS — C19 Malignant neoplasm of rectosigmoid junction: Secondary | ICD-10-CM

## 2014-06-10 DIAGNOSIS — Z95828 Presence of other vascular implants and grafts: Secondary | ICD-10-CM

## 2014-06-10 DIAGNOSIS — R112 Nausea with vomiting, unspecified: Secondary | ICD-10-CM

## 2014-06-10 LAB — COMPREHENSIVE METABOLIC PANEL (CC13)
ALT: 13 U/L (ref 0–55)
ANION GAP: 9 meq/L (ref 3–11)
AST: 14 U/L (ref 5–34)
Albumin: 3.2 g/dL — ABNORMAL LOW (ref 3.5–5.0)
Alkaline Phosphatase: 75 U/L (ref 40–150)
BUN: 10.5 mg/dL (ref 7.0–26.0)
CO2: 20 meq/L — AB (ref 22–29)
Calcium: 11.1 mg/dL — ABNORMAL HIGH (ref 8.4–10.4)
Chloride: 109 mEq/L (ref 98–109)
Creatinine: 0.8 mg/dL (ref 0.6–1.1)
EGFR: >90 mL/min/{1.73_m2} (ref >90)
Glucose: 101 mg/dl (ref 70–140)
Potassium: 4 mEq/L (ref 3.5–5.1)
Sodium: 138 mEq/L (ref 136–145)
TOTAL PROTEIN: 7.4 g/dL (ref 6.4–8.3)

## 2014-06-10 LAB — CBC WITH DIFFERENTIAL/PLATELET
BASO%: 0.2 % (ref 0.0–2.0)
Basophils Absolute: 0 10*3/uL (ref 0.0–0.1)
EOS ABS: 0.2 10*3/uL (ref 0.0–0.5)
EOS%: 3.4 % (ref 0.0–7.0)
HEMATOCRIT: 32.1 % — AB (ref 34.8–46.6)
HGB: 10.5 g/dL — ABNORMAL LOW (ref 11.6–15.9)
LYMPH%: 27.2 % (ref 14.0–49.7)
MCH: 28.5 pg (ref 25.1–34.0)
MCHC: 32.7 g/dL (ref 31.5–36.0)
MCV: 87 fL (ref 79.5–101.0)
MONO#: 0.5 10*3/uL (ref 0.1–0.9)
MONO%: 10.3 % (ref 0.0–14.0)
NEUT%: 58.9 % (ref 38.4–76.8)
NEUTROS ABS: 2.6 10*3/uL (ref 1.5–6.5)
PLATELETS: 278 10*3/uL (ref 145–400)
RBC: 3.69 10*6/uL — ABNORMAL LOW (ref 3.70–5.45)
RDW: 13.1 % (ref 11.2–14.5)
WBC: 4.5 10*3/uL (ref 3.9–10.3)
lymph#: 1.2 10*3/uL (ref 0.9–3.3)

## 2014-06-10 LAB — CEA: CEA: 4 ng/mL (ref 0.0–5.0)

## 2014-06-10 MED ORDER — FLUOROURACIL CHEMO INJECTION 2.5 GM/50ML
400.0000 mg/m2 | Freq: Once | INTRAVENOUS | Status: AC
  Administered 2014-06-10: 700 mg via INTRAVENOUS
  Filled 2014-06-10: qty 14

## 2014-06-10 MED ORDER — PROCHLORPERAZINE EDISYLATE 5 MG/ML IJ SOLN
INTRAMUSCULAR | Status: AC
  Filled 2014-06-10: qty 2

## 2014-06-10 MED ORDER — LORAZEPAM 2 MG/ML IJ SOLN
INTRAMUSCULAR | Status: AC
  Filled 2014-06-10: qty 1

## 2014-06-10 MED ORDER — METHYLPREDNISOLONE SODIUM SUCC 125 MG IJ SOLR
INTRAMUSCULAR | Status: AC
  Filled 2014-06-10: qty 2

## 2014-06-10 MED ORDER — LORAZEPAM 2 MG/ML IJ SOLN
0.5000 mg | Freq: Once | INTRAMUSCULAR | Status: AC
  Administered 2014-06-10: 0.5 mg via INTRAVENOUS

## 2014-06-10 MED ORDER — ATROPINE SULFATE 1 MG/ML IJ SOLN
INTRAMUSCULAR | Status: AC
  Filled 2014-06-10: qty 1

## 2014-06-10 MED ORDER — PROCHLORPERAZINE EDISYLATE 5 MG/ML IJ SOLN
10.0000 mg | Freq: Once | INTRAMUSCULAR | Status: AC
  Administered 2014-06-10: 10 mg via INTRAVENOUS

## 2014-06-10 MED ORDER — SODIUM CHLORIDE 0.9 % IJ SOLN
10.0000 mL | INTRAMUSCULAR | Status: DC | PRN
  Administered 2014-06-10: 10 mL via INTRAVENOUS
  Filled 2014-06-10: qty 10

## 2014-06-10 MED ORDER — ATROPINE SULFATE 1 MG/ML IJ SOLN
0.5000 mg | Freq: Once | INTRAMUSCULAR | Status: AC | PRN
  Administered 2014-06-10: 0.5 mg via INTRAVENOUS

## 2014-06-10 MED ORDER — ONDANSETRON 16 MG/50ML IVPB (CHCC)
16.0000 mg | Freq: Once | INTRAVENOUS | Status: AC
  Administered 2014-06-10: 16 mg via INTRAVENOUS

## 2014-06-10 MED ORDER — LEUCOVORIN CALCIUM INJECTION 350 MG
700.0000 mg | Freq: Once | INTRAVENOUS | Status: AC
  Administered 2014-06-10: 700 mg via INTRAVENOUS
  Filled 2014-06-10: qty 35

## 2014-06-10 MED ORDER — DEXTROSE 5 % IV SOLN
180.0000 mg/m2 | Freq: Once | INTRAVENOUS | Status: AC
  Administered 2014-06-10: 322 mg via INTRAVENOUS
  Filled 2014-06-10: qty 16.1

## 2014-06-10 MED ORDER — CAPECITABINE 500 MG PO TABS: 1250.0000 mg/m2 | ORAL_TABLET | Freq: Two times a day (BID) | ORAL | Status: DC

## 2014-06-10 MED ORDER — METHYLPREDNISOLONE SODIUM SUCC 125 MG IJ SOLR
125.0000 mg | Freq: Once | INTRAMUSCULAR | Status: AC
  Administered 2014-06-10: 125 mg via INTRAVENOUS

## 2014-06-10 MED ORDER — ONDANSETRON 16 MG/50ML IVPB (CHCC)
INTRAVENOUS | Status: AC
  Filled 2014-06-10: qty 16

## 2014-06-10 MED ORDER — SODIUM CHLORIDE 0.9 % IV SOLN
2400.0000 mg/m2 | INTRAVENOUS | Status: DC
  Administered 2014-06-10: 4300 mg via INTRAVENOUS
  Filled 2014-06-10: qty 86

## 2014-06-10 MED ORDER — DEXAMETHASONE SODIUM PHOSPHATE 20 MG/5ML IJ SOLN
20.0000 mg | Freq: Once | INTRAMUSCULAR | Status: AC
  Administered 2014-06-10: 20 mg via INTRAVENOUS

## 2014-06-10 MED ORDER — DEXAMETHASONE SODIUM PHOSPHATE 20 MG/5ML IJ SOLN
INTRAMUSCULAR | Status: AC
  Filled 2014-06-10: qty 5

## 2014-06-10 NOTE — Progress Notes (Signed)
1410:Pt reports nausea has improved

## 2014-06-10 NOTE — Progress Notes (Signed)
@   1330 pt. Up to use bathroom. After she returned she c/o nausea and then vomited about 200 cc greenish stomach contents. Selena Lesser , NP notified and in to see pt. Received orders for Solumedrol and Ativan. This was given.  Pt. States she feels somewhat better after about 10-15 minutes.  Chemo paused during administration of above meds and then resumed.  @1430  patient still c/o nausea. Selena Lesser, NP notified and ordered compazine 10 mg IVP.   @1500  pt. States she is feeling somewhat better, but sleepy. Pt's daughter here to take her home.  Completed rest of chemo per Dr. Burr Medico along with 5FU pump.  Pt. Instructed to take decadron 4 mg 1 tablet 2x a day for the next 3 days.  Instructions written on discharge summary. Pt and daughter voiced understanding.

## 2014-06-10 NOTE — Progress Notes (Signed)
[place] ONCOLOGY OFFICE PROGRESS NOTE DATE OF VISIT: 05/20/2014    DIAGNOSIS: Metastatic colon cancer to liver   MOLECULAR FEATURES ([facility]):   Chief Complaint  Patient presents with  . Follow-up    colon ca    CURRENT TREATMENT: Folfox q 14 weeks (07/09/2013 until 08/20/2013).  Switched to [facility] q 14 days due to mixed response (started on 09/13/2013).  After a chemoherapy break x 1 month, She resumed FOLFIRI regimen on 04/08/14 but changed the frequency to Every 3 weeks for better tolerance. Also it was discussed that after 2 more cycles and a scan if we can switch her to Oral Xeloda Regimen as maintenance therapy.    Colorectal cancer, stage IV   05/22/2013 - 05/27/2013 Hospital Admission A/w abdominal distention and constipation for 2 days CT scan of her abdomen and pelvis showed area of bowel wall thickening at the rectosigmoid junction concerning for malignancy.   05/22/2013 Imaging CT of Abdomen: bowel wall thickening with shouldering of the proximal and distal margins, involving therectosigmoid junction, supicous liver spleen mets. Colonsocopy recommended.    05/23/2013 Tumor Marker CEA 225.7   05/24/2013 Imaging MRI of abdomen: 3.7 x 4.4 cm enhancing lesion along the superior spleen, indeterminate but worrisome for metastasis.2.7 x 3.9 cm enhancing lesion in the medial segment left hepatic lobe,    05/25/2013 Pathology Results Diagnosis Rectum, biopsy, proximal - INVASIVE ADENOCARCINOMA.   05/25/2013 Procedure Flex sig: (Dr. Ardis Hughs). 5-6 cm obstructing rectosigmoid mass distal end 10 cm from anal verge; mass biopsed and then stented 9 cm long, 22 cm diameter uncovered SEM.    05/25/2013 Initial Diagnosis Colorectal cancer, stage IV   06/25/2013 Procedure R port a cath placement.   07/09/2013 - 08/20/2013 Chemotherapy FOLFOX q 2 weeks started.  Not elgible for clinical trial and bevacimab based on stent and high risk for perforation.    07/30/2013 Tumor  Marker KRAS positive.  p53, APC identified. Mismatch Repair (MMR) preserved.    08/06/2013 Adverse Reaction Complains of darkening of her hands with peeling.    08/27/2013 Family History She was seen by Genetic couselor.  She declined testing today, but will call and reschedule an appointment should she decide to pursue testing.   09/07/2013 Imaging CT abdomen: Mixed response to therapy. Response to therapy of hepatic metastasis. right hepatic hemangiomas are again identified. Other too small to characterize liver lesions are felt to be similar but are indeterminate.. Enlargement of a splenic lesions   09/13/2013 Tumor Marker CEA 33.5   09/13/2013 Treatment Plan Change Reviewed scans consistent with mixed response.  Switch to FOLFIRI.  Provided script for immodium.    10/29/2013 Adverse Reaction Patient reports intense abdominal cramping lasting the first few days on chemotherapy.  Starting Hyoscyamine 0.125 mg q 6 hours prn.    11/15/2013 - 11/15/2013 Hospital Admission Patient presented to ED with migrated stent.  Stent retrieved by Dr. Ardis Hughs.  Imaging negative for perforation.   Discussed in conference with referral to Dr. Zella Richer made.    11/23/2013 Imaging CT Chest. 1. Stable left lower lobe 5 mm pulmonary nodule. 2. Interval increase in size splenic mass is concerning formetastatic lesion.3. Interval calcification of the left hepatic lobe metastasis. Noevidence of active residual disease by CT.   12/10/2013 Tumor Marker CEA 8.3   01/14/2014 Tumor Marker CEA, 6.4   02/11/2014 Tumor Marker CEA, 5.0   02/25/2014 -  Chemotherapy Folfiri given and also given a chemotherapy Holiday for 1 month   02/25/2014 Tumor Marker  CEA 4.5   04/01/2014 Tumor Marker CEA 4.8   04/29/2014 Tumor Marker CEA 3.0   05/20/2014 -  Chemotherapy folfiri today    INTERVAL HISTORY:  [name] [name] 63 y.o. female with a history of Stage IV colon cancer is here for follow-up.  She was last seen by Dr. Arlys John, who has left the  practice. She feels well in general. She has been tolerating chemo well. She has mild nausea after chemo for one day, no other significant side effects. She has good appetite and eats well. She has strong faith in Good, which helps her and keeps her going.    MEDICAL HISTORY: Past Medical History  Diagnosis Date  . Anxiety   . Colon cancer     dx'd 2014    ALLERGIES:  is allergic to codeine.  MEDICATIONS: has a current medication list which includes the following prescription(s): lidocaine-prilocaine, loperamide, lorazepam, ondansetron, polyethylene glycol powder, and prochlorperazine.  SURGICAL HISTORY:  Past Surgical History  Procedure Laterality Date  . Abdominal hysterectomy  2005  . Cesarean section  1976  . Flexible sigmoidoscopy N/A 05/25/2013    Procedure: FLEXIBLE SIGMOIDOSCOPY;  Surgeon: [name], MD;  Location: [facility];  Service: Endoscopy;  Laterality: N/A;  needs floro  . Colonic stent placement N/A 05/25/2013    Procedure: COLONIC STENT PLACEMENT;  Surgeon: [name], MD;  Location: [facility];  Service: Endoscopy;  Laterality: N/A;  . Portacath placement Right 06/25/2013    Procedure: ULTRA SOUND GUIDED INSERTION PORT-A-CATH;  Surgeon: [name], MD;  Location: [place];  Service: General;  Laterality: Right;    REVIEW OF SYSTEMS:   Constitutional: Denies fevers, chills or abnormal weight loss Eyes: Denies blurriness of vision Ears, nose, mouth, throat, and face: Denies mucositis or sore throat Respiratory: Denies cough, dyspnea or wheezes Cardiovascular: Denies palpitation, chest discomfort or lower extremity swelling Gastrointestinal:  Denies nausea, heartburn or change in bowel habits Skin: Denies abnormal skin rashes Lymphatics: Denies new lymphadenopathy or easy bruising Neurological:Denies numbness, tingling or new weaknesses Behavioral/Psych: Mood is stable, no new changes  All other systems were reviewed with  the patient and are negative.  PHYSICAL EXAMINATION: ECOG PERFORMANCE STATUS: 0-Asymptomatic  Blood pressure 127/65, pulse 75, temperature 98.4 F (36.9 C), temperature source Oral, resp. rate 18, height $RemoveBe'5\' 4"'RQaLcxphW$  (1.626 m), weight 154 lb 4.8 oz (69.99 kg), SpO2 100 %.  GENERAL:alert, no distress and comfortable; well-developed, well nourished.  SKIN: skin color, texture, turgor are normal, no rashes or significant lesions; + R Port a cath without tenderness; hyperpigmentation of her hands.  EYES: normal, Conjunctiva are pink and non-injected, sclera clear  OROPHARYNX:no exudate, no erythema and lips, buccal mucosa  NECK: supple, thyroid normal size, non-tender, without nodularity  LYMPH: no palpable lymphadenopathy in the cervical, axillary or supraclavicular  LUNGS: clear to auscultation and percussion with normal breathing effort  HEART: Tachycardic with regular rhythm and no murmurs and no lower extremity edema  ABDOMEN:abdomen soft, non-tender and normal bowel sounds Musculoskeletal:no cyanosis of digits and no clubbing  NEURO: alert & oriented x 3 with fluent speech, no focal motor/sensory deficits  Labs:  Lab Results  Component Value Date   WBC 4.5 06/10/2014   HGB 10.5* 06/10/2014   HCT 32.1* 06/10/2014   MCV 87.0 06/10/2014   PLT 278 06/10/2014   NEUTROABS 2.6 06/10/2014      Chemistry      Component Value Date/Time   NA 138 06/10/2014 0830  NA 139 02/11/2014 0817   K 4.0 06/10/2014 0830   K 4.6 02/11/2014 0817   CL 104 02/11/2014 0817   CO2 20* 06/10/2014 0830   CO2 24 02/11/2014 0817   BUN 10.5 06/10/2014 0830   BUN 13 02/11/2014 0817   CREATININE 0.8 06/10/2014 0830   CREATININE 0.75 02/11/2014 0817      Component Value Date/Time   CALCIUM 11.1* 06/10/2014 0830   CALCIUM 11.7* 02/11/2014 0817   ALKPHOS 75 06/10/2014 0830   ALKPHOS 68 02/11/2014 0817   AST 14 06/10/2014 0830   AST 13 02/11/2014 0817   ALT 13 06/10/2014 0830   ALT 8 02/11/2014 0817    BILITOT <0.20 06/10/2014 0830   BILITOT <0.2* 02/11/2014 0817     CEA  Status: Finalresult Visible to patient:  Not Released Nextappt: 06/12/2014 at 12:00 PM in Oncology [facility] Flush Nurse) Dx:  Colon cancer           Ref Range 8:30 AM  3wk ago  712moago  241mogo     CEA 0.0 - 5.0 ng/mL 4.0 4.3 3.0 4.8       RADIOGRAPHIC STUDIES:  CT CHEST W CONTRAST 11/23/13: IMPRESSION:  1. Stable left lower lobe 5 mm pulmonary nodule. 2. Interval increase in size splenic mass is concerning for metastatic lesion.  3. Interval calcification of the left hepatic lobe metastasis. No evidence of active residual disease by CT. 4. Stable benign hepatic hemangiomas.    ASSESSMENT: CaCHERENE DOBBINS28.o. female with a history of stage IV colon adenocarcinoma, KRAS mutation(+), on second line FOLFIRI since 08/2013, cycle 16 today.   1. 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 -she is tolerating chemo well, but strongly wants to be off IV chemo as per her conversation with Dr. SeArlys Johnast time.  -I recommend to repeat a restaging CT. If she has no disease progression, will consider switch to capcitabine maintenance chemo. She agrees.  -CEA level remains very low, stable. She is likely responding to chemo  -proceed FOLFIRI cycle 16 today   2. S/p migrated colorectal stent, s/p retrieval on 05/14 --  We referred to Dr. RoZella Richeror consideration of surgery if indicated and she had an appointment on 06/30.  She declined surgical intervention presently.  She will follow up with GI/Surgery as needed.    3. Family history of Colon cancer.  --Based on her age and family history (sister deceased at age [age]rom colon cancer), referral for genetic testing for Lynch syndrome, i.e. MSI or IHC, would be appropriate. We made a referral to genetics. Her tumor was negative for lynch syndrome. She was seen on 02/23 but declined further testing for  genetic syndromes presently.   4. 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  -chronic elevated Ca, correct level 11.7 today. She knows to drink more fluids, avoid dehydration -zometa [id number] X1   5. Follow-up.  -4 weeks with restaging CT and lab    All questions were answered. The patient knows to call the clinic with any problems, questions or concerns. We can certainly see the patient much sooner if necessary.  I spent 30 minutes counseling the patient face to face. The total time spent in the appointment was 35 minutes.  FenTruitt Merle2/01/2014

## 2014-06-10 NOTE — Patient Instructions (Signed)
[facility] Discharge Instructions for Patients Receiving Chemotherapy  Today you received the following chemotherapy agents: Irinotecan, leucovorin, 5FU To help prevent nausea and vomiting after your treatment, we encourage you to take your nausea medication: Compazine 10 mg every 6 hours as needed.   If you develop nausea and vomiting that is not controlled by your nausea medication, call the clinic.   BELOW ARE SYMPTOMS THAT SHOULD BE REPORTED IMMEDIATELY:  *FEVER GREATER THAN 100.5 F  *CHILLS WITH OR WITHOUT FEVER  NAUSEA AND VOMITING THAT IS NOT CONTROLLED WITH YOUR NAUSEA MEDICATION  *UNUSUAL SHORTNESS OF BREATH  *UNUSUAL BRUISING OR BLEEDING  TENDERNESS IN MOUTH AND THROAT WITH OR WITHOUT PRESENCE OF ULCERS  *URINARY PROBLEMS  *BOWEL PROBLEMS  UNUSUAL RASH Items with * indicate a potential emergency and should be followed up as soon as possible.  Feel free to call the clinic you have any questions or concerns. The clinic phone number is [phone].

## 2014-06-10 NOTE — Patient Instructions (Signed)

## 2014-06-10 NOTE — Telephone Encounter (Addendum)
Gave avs & cal for Dec/Jan. Sch per MD phone conv.

## 2014-06-12 ENCOUNTER — Ambulatory Visit (HOSPITAL_BASED_OUTPATIENT_CLINIC_OR_DEPARTMENT_OTHER): Payer: Medicaid Other

## 2014-06-12 ENCOUNTER — Telehealth: Payer: Self-pay | Admitting: Hematology

## 2014-06-12 ENCOUNTER — Encounter: Payer: Self-pay | Admitting: Nurse Practitioner

## 2014-06-12 ENCOUNTER — Ambulatory Visit: Payer: Medicaid Other

## 2014-06-12 ENCOUNTER — Other Ambulatory Visit: Payer: Self-pay | Admitting: Hematology

## 2014-06-12 ENCOUNTER — Other Ambulatory Visit: Payer: Self-pay | Admitting: *Deleted

## 2014-06-12 DIAGNOSIS — C19 Malignant neoplasm of rectosigmoid junction: Secondary | ICD-10-CM

## 2014-06-12 DIAGNOSIS — Z452 Encounter for adjustment and management of vascular access device: Secondary | ICD-10-CM

## 2014-06-12 DIAGNOSIS — R112 Nausea with vomiting, unspecified: Secondary | ICD-10-CM | POA: Insufficient documentation

## 2014-06-12 DIAGNOSIS — C189 Malignant neoplasm of colon, unspecified: Secondary | ICD-10-CM

## 2014-06-12 MED ORDER — HEPARIN SOD (PORK) LOCK FLUSH 100 UNIT/ML IV SOLN
500.0000 [IU] | Freq: Once | INTRAVENOUS | Status: AC | PRN
  Administered 2014-06-12: 500 [IU]
  Filled 2014-06-12: qty 5

## 2014-06-12 MED ORDER — SODIUM CHLORIDE 0.9 % IV SOLN
Freq: Once | INTRAVENOUS | Status: AC
  Administered 2014-06-12: 15:00:00 via INTRAVENOUS

## 2014-06-12 MED ORDER — HEPARIN SOD (PORK) LOCK FLUSH 100 UNIT/ML IV SOLN
500.0000 [IU] | Freq: Once | INTRAVENOUS | Status: AC
  Administered 2014-06-12: 500 [IU] via INTRAVENOUS
  Filled 2014-06-12: qty 5

## 2014-06-12 MED ORDER — CAPECITABINE 500 MG PO TABS: 1500.0000 mg | ORAL_TABLET | Freq: Two times a day (BID) | ORAL | Status: DC

## 2014-06-12 MED ORDER — SODIUM CHLORIDE 0.9 % IJ SOLN
10.0000 mL | INTRAMUSCULAR | Status: DC | PRN
  Administered 2014-06-12: 10 mL via INTRAVENOUS
  Filled 2014-06-12: qty 10

## 2014-06-12 MED ORDER — CAPECITABINE 150 MG PO TABS: 300.0000 mg | ORAL_TABLET | Freq: Two times a day (BID) | ORAL | Status: DC

## 2014-06-12 MED ORDER — ZOLEDRONIC ACID 4 MG/100ML IV SOLN
4.0000 mg | Freq: Once | INTRAVENOUS | Status: AC
  Administered 2014-06-12: 4 mg via INTRAVENOUS
  Filled 2014-06-12: qty 100

## 2014-06-12 MED ORDER — SODIUM CHLORIDE 0.9 % IJ SOLN
10.0000 mL | INTRAMUSCULAR | Status: DC | PRN
  Administered 2014-06-12: 10 mL
  Filled 2014-06-12: qty 10

## 2014-06-12 NOTE — Assessment & Plan Note (Signed)
Patient presented to the [facility] today to receive cycle 16 of her Folfiri chemotherapy regimen.  She had already completed the Irinotecan portion of her chemotherapy; and developed some significant nausea and vomiting.  All further chemotherapy was held; and patient was given Solu-Medrol 125 mg IV, Ativan 0.5 mg IV, and Compazine IV as well.  All vomiting resolved; the patient continue to complain of some mild nausea.  The patient was able to complete the 5-FU push chemotherapy; and was connected to the 5-FU Popp as previously directed.  Patient was prescribed dexamethasone 4 mg twice a day for a total of 3 days to help with any nausea at home as well.

## 2014-06-12 NOTE — Telephone Encounter (Signed)
, °

## 2014-06-12 NOTE — Assessment & Plan Note (Addendum)
Corrected calcium today was 11.74.  Patient to receive Zometa infusion on 06/12/2014 if at all possible.  Will confirm this appointment; and let the patient know.

## 2014-06-12 NOTE — Assessment & Plan Note (Addendum)
Patient presented to the [facility] today to receive cycle 16 of her Folfiri chemotherapy regimen.  She has plans to return for labs and a restaging CT on 07/01/2014.  She will return on 07/08/2014 for follow-up visit and review her scan results.

## 2014-06-12 NOTE — Patient Instructions (Signed)

## 2014-06-12 NOTE — Progress Notes (Signed)
will   SYMPTOM MANAGEMENT CLINIC   HPI: [name] [name] 63 y.o. female diagnosed with colon cancer with liver metastasis.  Patient is currently undergoing Folfiri chemotherapy regimen.  Patient presented to the [facility] today to receive cycle 16 of her Folfiri chemotherapy regimen.  She had completed the irinotecan portion of her chemotherapy; and developed significant nausea and vomiting 1.  All further chemotherapy was held; and patient received Solu-Medrol 125 mg, Ativan 0.5 mg, and Compazine 10 mg.  This resolved all further vomiting; but the patient continued slightly nauseous.  Patient was able to complete the 5-FU push and connection to the 5-FU pump however.  Patient was also prescribed dexamethasone 4 mg twice daily for a total of 3 days to help with nausea at home as well.  No other issues whatsoever surrounding chemotherapy infusion today.  HPI  CURRENT THERAPY: Upcoming Treatment Dates - COLORECTAL FOLFIRI q14d Days with orders from any treatment category:  06/12/2014      SCHEDULING COMMUNICATION      sodium chloride 0.9 % injection 10 mL      heparin lock flush 100 unit/mL      heparin lock flush 100 unit/mL      alteplase (CATHFLO ACTIVASE) injection 2 mg      sodium chloride 0.9 % injection 3 mL      Cold Pack 1 packet    ROS  Past Medical History  Diagnosis Date  . Anxiety   . Colon cancer     dx'd 2014    Past Surgical History  Procedure Laterality Date  . Abdominal hysterectomy  2005  . Cesarean section  1976  . Flexible sigmoidoscopy N/A 05/25/2013    Procedure: FLEXIBLE SIGMOIDOSCOPY;  Surgeon: [name], MD;  Location: [facility];  Service: Endoscopy;  Laterality: N/A;  needs floro  . Colonic stent placement N/A 05/25/2013    Procedure: COLONIC STENT PLACEMENT;  Surgeon: [name], MD;  Location: [facility];  Service: Endoscopy;  Laterality: N/A;  . Portacath placement Right 06/25/2013    Procedure: ULTRA SOUND GUIDED INSERTION  PORT-A-CATH;  Surgeon: [name], MD;  Location: [facility];  Service: General;  Laterality: Right;    has Rectal mass; Colon obstruction; Hypercalcemia; Colorectal cancer, stage IV; Family history of colorectal cancer; Thrush, oral; Tachycardia; Migrated colon stent; and Nausea with vomiting on her problem list.     is allergic to codeine.    Medication List       This list is accurate as of: 06/10/14 11:59 PM.  Always use your most recent med list.               capecitabine 500 MG tablet  Commonly known as:  XELODA  Take 4 tablets (2,000 mg total) by mouth 2 (two) times daily after a meal. For 14 days, then off 7 days.  Start taking on:  07/08/2014     lidocaine-prilocaine cream  Commonly known as:  EMLA  Apply 1 application topically as needed. Apply to port site one hour before treatment and cover with plastic wrap.     loperamide 2 MG tablet  Commonly known as:  IMODIUM A-D  Take 2 at onset of diarrhea, then 1 every 2hrs until 12hr without a BM. May take 2 tab every 4hrs at bedtime. If diarrhea recurs repeat.     LORazepam 0.5 MG tablet  Commonly known as:  ATIVAN  Take 1 tablet (0.5 mg total) by mouth every 8 (eight)  hours as needed (nausea/vomiting).     ondansetron 8 MG tablet  Commonly known as:  ZOFRAN  Take 1 tablet (35m) by mouth 2 times a day starting the day after chemo for 2 days. Then take 1 tablet (817m twice a day as needed for nausea or vomiting.     polyethylene glycol powder powder  Commonly known as:  GLYCOLAX/MIRALAX  TAKE 34 G (2 DOSES) BY MOUTH DAILY AS NEEDED FOR MILD CONSTIPATION OR MODERATE CONSTIPATION     prochlorperazine 10 MG tablet  Commonly known as:  COMPAZINE  TAKE 1 TABLET (10 MG TOTAL) BY MOUTH EVERY 6 (SIX) HOURS AS NEEDED (NAUSEA OR VOMITING).         PHYSICAL EXAMINATION  Vitals: BP  127/65, HR 75, temp 98.4  Physical Exam  Constitutional: She is oriented to person, place, and time. Vital signs are  normal. She appears unhealthy.  HENT:  Head: Normocephalic and atraumatic.  Mouth/Throat: Oropharynx is clear and moist.  Eyes: Conjunctivae and EOM are normal. Pupils are equal, round, and reactive to light. Right eye exhibits no discharge. Left eye exhibits no discharge. No scleral icterus.  Neck: Normal range of motion. Neck supple. No JVD present. No tracheal deviation present. No thyromegaly present.  Cardiovascular: Normal rate, regular rhythm, normal heart sounds and intact distal pulses.   Pulmonary/Chest: Effort normal and breath sounds normal. No respiratory distress. She has no wheezes. She has no rales.  Abdominal: Soft. Bowel sounds are normal. She exhibits no distension. There is no tenderness. There is no rebound.  Musculoskeletal: Normal range of motion. She exhibits no edema or tenderness.  Lymphadenopathy:    She has no cervical adenopathy.  Neurological: She is alert and oriented to person, place, and time.  Skin: Skin is warm and dry. No rash noted. No erythema.  Psychiatric: Affect normal.  Nursing note and vitals reviewed.   LABORATORY DATA:. Appointment on 06/10/2014  Component Date Value Ref Range Status  . WBC 06/10/2014 4.5  3.9 - 10.3 10e3/uL Final  . NEUT# 06/10/2014 2.6  1.5 - 6.5 10e3/uL Final  . HGB 06/10/2014 10.5* 11.6 - 15.9 g/dL Final  . HCT 06/10/2014 32.1* 34.8 - 46.6 % Final  . Platelets 06/10/2014 278  145 - 400 10e3/uL Final  . MCV 06/10/2014 87.0  79.5 - 101.0 fL Final  . MCH 06/10/2014 28.5  25.1 - 34.0 pg Final  . MCHC 06/10/2014 32.7  31.5 - 36.0 g/dL Final  . RBC 06/10/2014 3.69* 3.70 - 5.45 10e6/uL Final  . RDW 06/10/2014 13.1  11.2 - 14.5 % Final  . lymph# 06/10/2014 1.2  0.9 - 3.3 10e3/uL Final  . MONO# 06/10/2014 0.5  0.1 - 0.9 10e3/uL Final  . Eosinophils Absolute 06/10/2014 0.2  0.0 - 0.5 10e3/uL Final  . Basophils Absolute 06/10/2014 0.0  0.0 - 0.1 10e3/uL Final  . NEUT% 06/10/2014 58.9  38.4 - 76.8 % Final  . LYMPH% 06/10/2014  27.2  14.0 - 49.7 % Final  . MONO% 06/10/2014 10.3  0.0 - 14.0 % Final  . EOS% 06/10/2014 3.4  0.0 - 7.0 % Final  . BASO% 06/10/2014 0.2  0.0 - 2.0 % Final  . Sodium 06/10/2014 138  136 - 145 mEq/L Final  . Potassium 06/10/2014 4.0  3.5 - 5.1 mEq/L Final  . Chloride 06/10/2014 109  98 - 109 mEq/L Final  . CO2 06/10/2014 20* 22 - 29 mEq/L Final  . Glucose 06/10/2014 101  70 - 140 mg/dl Final  .  BUN 06/10/2014 10.5  7.0 - 26.0 mg/dL Final  . Creatinine 06/10/2014 0.8  0.6 - 1.1 mg/dL Final  . Total Bilirubin 06/10/2014 <0.20  0.20 - 1.20 mg/dL Final  . Alkaline Phosphatase 06/10/2014 75  40 - 150 U/L Final  . AST 06/10/2014 14  5 - 34 U/L Final  . ALT 06/10/2014 13  0 - 55 U/L Final  . Total Protein 06/10/2014 7.4  6.4 - 8.3 g/dL Final  . Albumin 06/10/2014 3.2* 3.5 - 5.0 g/dL Final  . Calcium 06/10/2014 11.1* 8.4 - 10.4 mg/dL Final  . Anion Gap 06/10/2014 9  3 - 11 mEq/L Final  . EGFR 06/10/2014 >90  >90 ml/min/1.73 m2 Final   eGFR is calculated using the CKD-EPI Creatinine Equation (2009)  . CEA 06/10/2014 4.0  0.0 - 5.0 ng/mL Final     RADIOGRAPHIC STUDIES: No results found.  ASSESSMENT/PLAN:    Colorectal cancer, stage IV Patient presented to the [facility] today to receive cycle 16 of her Folfiri chemotherapy regimen.  She has plans to return for labs and a restaging CT on 07/01/2014.  She will return on 07/08/2014 for follow-up visit and review her scan results.  Hypercalcemia Corrected calcium today was 11.74.  Patient to receive Zometa infusion on 06/12/2014 if at all possible.  Will confirm this appointment; and let the patient know.  Nausea with vomiting Patient presented to the [facility] today to receive cycle 16 of her Folfiri chemotherapy regimen.  She had already completed the Irinotecan portion of her chemotherapy; and developed some significant nausea and vomiting.  All further chemotherapy was held; and patient was given Solu-Medrol 125 mg IV, Ativan 0.5 mg  IV, and Compazine IV as well.  All vomiting resolved; the patient continue to complain of some mild nausea.  The patient was able to complete the 5-FU push chemotherapy; and was connected to the 5-FU Popp as previously directed.  Patient was prescribed dexamethasone 4 mg twice a day for a total of 3 days to help with any nausea at home as well.   Patient stated understanding of all instructions; and was in agreement with this plan of care. The patient knows to call the clinic with any problems, questions or concerns.   Review/collaboration with Dr. Burr Medico regarding all aspects of patient's visit today.   Total time spent with patient was 25 minutes; with geater than 75 percent of that time spent in face to face counseling regarding her symptoms, and coordination of care and follow up.  Disclaimer: This note was dictated with voice recognition software. Similar sounding words can inadvertently be transcribed and may not be corrected upon review.   Drue Second, NP 06/12/2014

## 2014-07-01 ENCOUNTER — Other Ambulatory Visit: Payer: Medicaid Other

## 2014-07-01 ENCOUNTER — Ambulatory Visit (HOSPITAL_COMMUNITY)
Admission: RE | Admit: 2014-07-01 | Discharge: 2014-07-01 | Disposition: A | Discharge status: Home / Self Care | Payer: Medicaid Other | Source: Ambulatory Visit | Attending: Hematology | Admitting: Hematology

## 2014-07-01 ENCOUNTER — Ambulatory Visit (HOSPITAL_BASED_OUTPATIENT_CLINIC_OR_DEPARTMENT_OTHER): Payer: Medicaid Other

## 2014-07-01 ENCOUNTER — Other Ambulatory Visit (HOSPITAL_BASED_OUTPATIENT_CLINIC_OR_DEPARTMENT_OTHER): Payer: Medicaid Other

## 2014-07-01 VITALS — BP 134/81 | HR 95 | Temp 97.9°F

## 2014-07-01 DIAGNOSIS — C19 Malignant neoplasm of rectosigmoid junction: Secondary | ICD-10-CM

## 2014-07-01 DIAGNOSIS — C189 Malignant neoplasm of colon, unspecified: Secondary | ICD-10-CM | POA: Insufficient documentation

## 2014-07-01 DIAGNOSIS — R918 Other nonspecific abnormal finding of lung field: Secondary | ICD-10-CM | POA: Insufficient documentation

## 2014-07-01 DIAGNOSIS — K7689 Other specified diseases of liver: Secondary | ICD-10-CM | POA: Diagnosis not present

## 2014-07-01 DIAGNOSIS — D1803 Hemangioma of intra-abdominal structures: Secondary | ICD-10-CM | POA: Diagnosis not present

## 2014-07-01 DIAGNOSIS — C787 Secondary malignant neoplasm of liver and intrahepatic bile duct: Secondary | ICD-10-CM

## 2014-07-01 DIAGNOSIS — Z95828 Presence of other vascular implants and grafts: Secondary | ICD-10-CM

## 2014-07-01 LAB — CBC WITH DIFFERENTIAL/PLATELET
BASO%: 0.9 % (ref 0.0–2.0)
Basophils Absolute: 0 10*3/uL (ref 0.0–0.1)
EOS%: 3 % (ref 0.0–7.0)
Eosinophils Absolute: 0.1 10*3/uL (ref 0.0–0.5)
HEMATOCRIT: 34.6 % — AB (ref 34.8–46.6)
HGB: 11.2 g/dL — ABNORMAL LOW (ref 11.6–15.9)
LYMPH%: 28.4 % (ref 14.0–49.7)
MCH: 27.9 pg (ref 25.1–34.0)
MCHC: 32.4 g/dL (ref 31.5–36.0)
MCV: 86.1 fL (ref 79.5–101.0)
MONO#: 0.5 10*3/uL (ref 0.1–0.9)
MONO%: 9.7 % (ref 0.0–14.0)
NEUT#: 2.9 10*3/uL (ref 1.5–6.5)
NEUT%: 58 % (ref 38.4–76.8)
PLATELETS: 286 10*3/uL (ref 145–400)
RBC: 4.02 10*6/uL (ref 3.70–5.45)
RDW: 13.5 % (ref 11.2–14.5)
WBC: 4.9 10*3/uL (ref 3.9–10.3)
lymph#: 1.4 10*3/uL (ref 0.9–3.3)

## 2014-07-01 LAB — COMPREHENSIVE METABOLIC PANEL (CC13)
ALBUMIN: 3.4 g/dL — AB (ref 3.5–5.0)
ALT: 13 U/L (ref 0–55)
ANION GAP: 10 meq/L (ref 3–11)
AST: 16 U/L (ref 5–34)
Alkaline Phosphatase: 77 U/L (ref 40–150)
BUN: 11 mg/dL (ref 7.0–26.0)
CALCIUM: 10.9 mg/dL — AB (ref 8.4–10.4)
CHLORIDE: 105 meq/L (ref 98–109)
CO2: 23 meq/L (ref 22–29)
CREATININE: 0.8 mg/dL (ref 0.6–1.1)
GLUCOSE: 88 mg/dL (ref 70–140)
POTASSIUM: 4.2 meq/L (ref 3.5–5.1)
Sodium: 138 mEq/L (ref 136–145)
Total Bilirubin: 0.22 mg/dL (ref 0.20–1.20)
Total Protein: 7.9 g/dL (ref 6.4–8.3)

## 2014-07-01 MED ORDER — SODIUM CHLORIDE 0.9 % IJ SOLN
10.0000 mL | INTRAMUSCULAR | Status: DC | PRN
  Administered 2014-07-01: 10 mL via INTRAVENOUS
  Filled 2014-07-01: qty 10

## 2014-07-01 MED ORDER — IOHEXOL 300 MG/ML  SOLN
100.0000 mL | Freq: Once | INTRAMUSCULAR | Status: AC | PRN
  Administered 2014-07-01: 100 mL via INTRAVENOUS

## 2014-07-01 MED ORDER — HEPARIN SOD (PORK) LOCK FLUSH 100 UNIT/ML IV SOLN
500.0000 [IU] | Freq: Once | INTRAVENOUS | Status: AC
  Administered 2014-07-01: 500 [IU] via INTRAVENOUS
  Filled 2014-07-01: qty 5

## 2014-07-01 NOTE — Patient Instructions (Signed)

## 2014-07-02 LAB — CEA: CEA: 4.6 ng/mL (ref 0.0–5.0)

## 2014-07-08 ENCOUNTER — Encounter: Payer: Self-pay | Admitting: Hematology

## 2014-07-08 ENCOUNTER — Ambulatory Visit (HOSPITAL_BASED_OUTPATIENT_CLINIC_OR_DEPARTMENT_OTHER): Payer: Medicaid Other | Admitting: Hematology

## 2014-07-08 ENCOUNTER — Telehealth: Payer: Self-pay | Admitting: Hematology

## 2014-07-08 VITALS — BP 128/64 | HR 83 | Temp 98.3°F | Resp 18 | Ht 64.0 in | Wt 153.7 lb

## 2014-07-08 DIAGNOSIS — C7989 Secondary malignant neoplasm of other specified sites: Secondary | ICD-10-CM

## 2014-07-08 DIAGNOSIS — C787 Secondary malignant neoplasm of liver and intrahepatic bile duct: Secondary | ICD-10-CM

## 2014-07-08 DIAGNOSIS — C19 Malignant neoplasm of rectosigmoid junction: Secondary | ICD-10-CM

## 2014-07-08 NOTE — Telephone Encounter (Signed)
gv and printed appt sched and avs for pt for Jan 2016 °

## 2014-07-08 NOTE — Progress Notes (Signed)
[facility] ONCOLOGY OFFICE PROGRESS NOTE    DIAGNOSIS: Metastatic colon cancer to liver   MOLECULAR FEATURES ([facility]):   Chief Complaint  Patient presents with  . Follow-up    CURRENT TREATMENT:  Oral Xeloda 1062m/m2 (18055m bid, 2 weeks on, one week off, as maintenance therapy starting 07/09/2014     Colorectal cancer, stage IV   05/22/2013 - 05/27/2013 Hospital Admission A/w abdominal distention and constipation for 2 days CT scan of her abdomen and pelvis showed area of bowel wall thickening at the rectosigmoid junction concerning for malignancy.   05/22/2013 Imaging CT of Abdomen: bowel wall thickening with shouldering of the proximal and distal margins, involving therectosigmoid junction, supicous liver spleen mets. Colonsocopy recommended.    05/23/2013 Tumor Marker CEA 225.7   05/24/2013 Imaging MRI of abdomen: 3.7 x 4.4 cm enhancing lesion along the superior spleen, indeterminate but worrisome for metastasis.2.7 x 3.9 cm enhancing lesion in the medial segment left hepatic lobe,    05/25/2013 Pathology Results Diagnosis Rectum, biopsy, proximal - INVASIVE ADENOCARCINOMA.   05/25/2013 Procedure Flex sig: (Dr. JaArdis Hughs 5-6 cm obstructing rectosigmoid mass distal end 10 cm from anal verge; mass biopsed and then stented 9 cm long, 22 cm diameter uncovered SEM.    05/25/2013 Initial Diagnosis Colorectal cancer, stage IV   06/25/2013 Procedure R port a cath placement.   07/09/2013 - 08/20/2013 Chemotherapy FOLFOX q 2 weeks started.  Not elgible for clinical trial and bevacimab based on stent and high risk for perforation. She received a total of 4 cycles.    07/30/2013 Tumor Marker KRAS positive.  p53, APC identified. Mismatch Repair (MMR) preserved.    08/06/2013 Adverse Reaction Complains of darkening of her hands with peeling.    08/27/2013 Family History She was seen by Genetic couselor.  She declined testing today, but will call and reschedule an appointment  should she decide to pursue testing.   09/07/2013 Imaging CT abdomen: Mixed response to therapy. Response to therapy of hepatic metastasis. right hepatic hemangiomas are again identified. Other too small to characterize liver lesions are felt to be similar but are indeterminate.. Enlargement of a splenic lesions   09/13/2013 - 06/10/2014 Chemotherapy Second line chemo FOLFIRI for a total of 16 cycles    09/13/2013 Tumor Marker CEA 33.5   09/13/2013 Treatment Plan Change Reviewed scans consistent with mixed response.  Switch to FOLFIRI, she received a total of 16 cycles, with a few breaks due to hospitalization and chemo holiday.    10/29/2013 Adverse Reaction Patient reports intense abdominal cramping lasting the first few days on chemotherapy.  Starting Hyoscyamine 0.125 mg q 6 hours prn.    11/15/2013 - 11/15/2013 Hospital Admission Patient presented to ED with migrated stent.  Stent retrieved by Dr. JaArdis Hughs Imaging negative for perforation.   Discussed in conference with referral to Dr. RoZella Richerade.    11/23/2013 Imaging CT Chest. 1. Stable left lower lobe 5 mm pulmonary nodule. 2. Interval increase in size splenic mass is concerning formetastatic lesion.3. Interval calcification of the left hepatic lobe metastasis. Noevidence of active residual disease by CT.   07/01/2014 Imaging CT restaging scan showed stable disease, improved liver lesion.    07/08/2014 -  Chemotherapy maintenance chemo with capacitain 100023m2 (1800m72mid started     INTERVAL HISTORY:  [name] Breedy63. female with a history of Stage IV colon cancer is here for follow-up.  She tolerated the last cycle chemo well, and has recovered well. She has good  appetite and eats well. She complains nail thickening secondary to chemotherapy, there is mild discharge from the left middle finger nail bed, she wrapped up with a bandage. Mild peripheral neuropathy, stable. No other new complaints. She has received Dripping from [facility].  MEDICAL HISTORY: Past Medical History  Diagnosis Date  . Anxiety   . Colon cancer     dx'd 2014    ALLERGIES:  is allergic to codeine.  MEDICATIONS: has a current medication list which includes the following prescription(s): lidocaine-prilocaine, loperamide, polyethylene glycol powder, capecitabine, capecitabine, lorazepam, ondansetron, and prochlorperazine.  SURGICAL HISTORY:  Past Surgical History  Procedure Laterality Date  . Abdominal hysterectomy  2005  . Cesarean section  1976  . Flexible sigmoidoscopy N/A 05/25/2013    Procedure: FLEXIBLE SIGMOIDOSCOPY;  Surgeon: [name], MD;  Location: [facility];  Service: Endoscopy;  Laterality: N/A;  needs floro  . Colonic stent placement N/A 05/25/2013    Procedure: COLONIC STENT PLACEMENT;  Surgeon: [name], MD;  Location: [facility];  Service: Endoscopy;  Laterality: N/A;  . Portacath placement Right 06/25/2013    Procedure: ULTRA SOUND GUIDED INSERTION PORT-A-CATH;  Surgeon: [name], MD;  Location: [facility];  Service: General;  Laterality: Right;    REVIEW OF SYSTEMS:   Constitutional: Denies fevers, chills or abnormal weight loss Eyes: Denies blurriness of vision Ears, nose, mouth, throat, and face: Denies mucositis or sore throat Respiratory: Denies cough, dyspnea or wheezes Cardiovascular: Denies palpitation, chest discomfort or lower extremity swelling Gastrointestinal:  Denies nausea, heartburn or change in bowel habits Skin: Denies abnormal skin rashes Lymphatics: Denies new lymphadenopathy or easy bruising Neurological:Denies numbness, tingling or new weaknesses Behavioral/Psych: Mood is stable, no new changes  All other systems were reviewed with the patient and are negative.  PHYSICAL EXAMINATION: ECOG PERFORMANCE STATUS: 0-Asymptomatic  Blood pressure 128/64, pulse 83, temperature 98.3 F (36.8 C), temperature source Oral, resp. rate 18, height '5\' 4"'  (1.626  m), weight 153 lb 11.2 oz (69.718 kg).  GENERAL:alert, no distress and comfortable; well-developed, well nourished.  SKIN: skin color, texture, turgor are normal, no rashes or significant lesions; + R Port a cath without tenderness; hyperpigmentation of her hands.  EYES: normal, Conjunctiva are pink and non-injected, sclera clear  OROPHARYNX:no exudate, no erythema and lips, buccal mucosa  NECK: supple, thyroid normal size, non-tender, without nodularity  LYMPH: no palpable lymphadenopathy in the cervical, axillary or supraclavicular  LUNGS: clear to auscultation and percussion with normal breathing effort  HEART: Tachycardic with regular rhythm and no murmurs and no lower extremity edema  ABDOMEN:abdomen soft, non-tender and normal bowel sounds Musculoskeletal:no cyanosis of digits and no clubbing  NEURO: alert & oriented x 3 with fluent speech, no focal motor/sensory deficits EXTREMITIES: Positive for thickening of nail bed, mild pinkish discharge from the left third nail bed, mild tenderness, no skin erythema or swelling.  Labs:  Lab Results  Component Value Date   WBC 4.9 07/01/2014   HGB 11.2* 07/01/2014   HCT 34.6* 07/01/2014   MCV 86.1 07/01/2014   PLT 286 07/01/2014   NEUTROABS 2.9 07/01/2014      Chemistry      Component Value Date/Time   NA 138 07/01/2014 1416   NA 139 02/11/2014 0817   K 4.2 07/01/2014 1416   K 4.6 02/11/2014 0817   CL 104 02/11/2014 0817   CO2 23 07/01/2014 1416   CO2 24 02/11/2014 0817   BUN 11.0 07/01/2014 1416   BUN  13 02/11/2014 0817   CREATININE 0.8 07/01/2014 1416   CREATININE 0.75 02/11/2014 0817      Component Value Date/Time   CALCIUM 10.9* 07/01/2014 1416   CALCIUM 11.7* 02/11/2014 0817   ALKPHOS 77 07/01/2014 1416   ALKPHOS 68 02/11/2014 0817   AST 16 07/01/2014 1416   AST 13 02/11/2014 0817   ALT 13 07/01/2014 1416   ALT 8 02/11/2014 0817   BILITOT 0.22 07/01/2014 1416   BILITOT <0.2* 02/11/2014 0817     CEA  Status:  Finalresult Visible to patient:  Not Released Nextappt: 06/12/2014 at 12:00 PM in Oncology [facility] Fl[facility] Nurse) Dx:  Colon cancer           Ref Range 8:30 AM  3wk ago  46moago  281mogo     CEA 0.0 - 5.0 ng/mL 4.0 4.3 3.0 4.8       RADIOGRAPHIC STUDIES:  CT chest, abdomen and pelvis with iv contrast 07/01/2014 IMPRESSION: 1. No acute findings within the chest abdomen or pelvis. 2. Stable nonspecific small pulmonary nodules measuring up to 5 mm. 3. No change in size of splenic metastasis. 4. Increase and wall thickening an soft tissue stranding involving the sigmoid colon. Cannot rule out local tumor recurrence or local tumor. 5. Stable appearance of the liver. There are 2 hemangiomas in several liver cysts. Treated tumor in the left hepatic lobe has decreased in size from the previous exam.    ASSESSMENT: CaPark Breed89.o. female with a history of stage IV colon adenocarcinoma, KRAS mutation(+), on second line FOLFIRI since 08/2013, cycle 16 today.   1. 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 -I reviewed her restaging CT scan, which showed improved liver lesion, stable spleen lesion. No other new lesions.  -I'll start her on maintenance capacitabine 1000 mg/m (1800 mg) twice a day tomorrow. If she does significant side effects, I have low threshold to reduce her dose. -I will present her case in our GI tumor board again to see if surgical resection is now an option   2. S/p migrated colorectal stent, s/p retrieval on 05/14 -- She will follow up with GI/Surgery as needed.    3. Family history of Colon cancer.  --Based on her age and family history (sister deceased at age 4415rom colon cancer), referral for genetic testing for Lynch syndrome, i.e. MSI or IHC, would be appropriate. We made a referral to genetics. Her tumor was negative for MSI high (Lynch syndrome). She was seen on 02/23 but declined  further testing for genetic syndromes presently.   4. 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  -chronic elevated Ca, down to 10.9 today. She knows to drink more fluids, avoid dehydration -zometa 78m19mv on 07/25/13 and 06/12/2014  PLAN -start Capecitabine tomorrow -RTC next Friday with lab for follow up   All questions were answered. The patient knows to call the clinic with any problems, questions or concerns. We can certainly see the patient much sooner if necessary.  I spent 30 minutes counseling the patient face to face. The total time spent in the appointment was 35 minutes.  FenTruitt Merle/10/2014

## 2014-07-15 ENCOUNTER — Other Ambulatory Visit: Payer: Self-pay | Admitting: *Deleted

## 2014-07-15 ENCOUNTER — Telehealth: Payer: Self-pay | Admitting: *Deleted

## 2014-07-15 DIAGNOSIS — C19 Malignant neoplasm of rectosigmoid junction: Secondary | ICD-10-CM

## 2014-07-15 MED ORDER — ONDANSETRON HCL 8 MG PO TABS: ORAL_TABLET | ORAL | Status: DC

## 2014-07-15 NOTE — Telephone Encounter (Signed)
Pt called and left message wanting to talk to md.  Spoke with pt and was informed that pt was feeling sick on Thurs and Fri last week after taking Xeloda 1800 mg BID.  Pt vomited later in the day both days.  Pt was able to drink po fluids some.  Pt was able to resume Xeloda on Sat. And Sunday  AM.  Pt did take Zofran before Xeloda as instructed, but not much help.  Pt did not take Xeloda today; pt stated she was feeling much better without chemo pills.  Pt has f/u appt with Dr. Burr Medico on Friday 07/19/14.   Pt wanted to know what Dr. Burr Medico would suggest. Pt's  Phone    [phone].

## 2014-07-15 NOTE — Telephone Encounter (Signed)
Spoke with pt and instructed pt to decrease Xeloda to 1500mg  by mouth  BID as per Dr. Burr Medico.   Instructed pt not to double dose today since pt missed this am.  Pt was able to verbalized back to nurse re: 1.  Take  Xeloda  1500mg   This pm  Only. 2.  Resume  Xeloda  1500mg   Twice  Daily  Starting  Tues. 07/16/14. 3.  Pt understood to take Zofran 8mg   30 min before taking Xeloda to help with nausea/vomiting. Zofran refill called in to [facility] on [address].

## 2014-07-18 ENCOUNTER — Telehealth: Payer: Self-pay | Admitting: Hematology

## 2014-07-18 ENCOUNTER — Telehealth: Payer: Self-pay | Admitting: *Deleted

## 2014-07-18 NOTE — Telephone Encounter (Signed)
Pt called and left message on voice mail wanting to talk to nurse.  Spoke with pt and was informed that rescheduled appt on 07/25/14 is fine with pt.  Pt stated she tolerated lower dose of Xeloda much better. Has constipation now but knows to take Miralax as needed. Pt's  Phone    [phone].

## 2014-07-18 NOTE — Telephone Encounter (Signed)
LM to confirm appt for 07/25/14.

## 2014-07-19 ENCOUNTER — Other Ambulatory Visit: Payer: Medicaid Other

## 2014-07-19 ENCOUNTER — Ambulatory Visit: Payer: Medicaid Other | Admitting: Hematology

## 2014-07-25 ENCOUNTER — Other Ambulatory Visit (HOSPITAL_BASED_OUTPATIENT_CLINIC_OR_DEPARTMENT_OTHER): Payer: Medicaid Other

## 2014-07-25 ENCOUNTER — Ambulatory Visit (HOSPITAL_BASED_OUTPATIENT_CLINIC_OR_DEPARTMENT_OTHER): Payer: Medicaid Other | Admitting: Hematology

## 2014-07-25 ENCOUNTER — Encounter: Payer: Self-pay | Admitting: Hematology

## 2014-07-25 ENCOUNTER — Telehealth: Payer: Self-pay | Admitting: Hematology

## 2014-07-25 ENCOUNTER — Ambulatory Visit (HOSPITAL_BASED_OUTPATIENT_CLINIC_OR_DEPARTMENT_OTHER): Payer: Medicaid Other

## 2014-07-25 VITALS — BP 133/50 | HR 94 | Temp 98.2°F | Resp 18 | Ht 64.0 in | Wt 153.3 lb

## 2014-07-25 DIAGNOSIS — C19 Malignant neoplasm of rectosigmoid junction: Secondary | ICD-10-CM

## 2014-07-25 DIAGNOSIS — C189 Malignant neoplasm of colon, unspecified: Secondary | ICD-10-CM

## 2014-07-25 DIAGNOSIS — C787 Secondary malignant neoplasm of liver and intrahepatic bile duct: Principal | ICD-10-CM

## 2014-07-25 DIAGNOSIS — Z95828 Presence of other vascular implants and grafts: Secondary | ICD-10-CM

## 2014-07-25 DIAGNOSIS — C7889 Secondary malignant neoplasm of other digestive organs: Secondary | ICD-10-CM

## 2014-07-25 LAB — COMPREHENSIVE METABOLIC PANEL (CC13)
ALT: 6 U/L (ref 0–55)
ANION GAP: 6 meq/L (ref 3–11)
AST: 12 U/L (ref 5–34)
Albumin: 3.4 g/dL — ABNORMAL LOW (ref 3.5–5.0)
Alkaline Phosphatase: 64 U/L (ref 40–150)
BUN: 13.5 mg/dL (ref 7.0–26.0)
CO2: 23 mEq/L (ref 22–29)
CREATININE: 0.8 mg/dL (ref 0.6–1.1)
Calcium: 11 mg/dL — ABNORMAL HIGH (ref 8.4–10.4)
Chloride: 107 mEq/L (ref 98–109)
Glucose: 90 mg/dl (ref 70–140)
Potassium: 4.5 mEq/L (ref 3.5–5.1)
Sodium: 136 mEq/L (ref 136–145)
TOTAL PROTEIN: 7.7 g/dL (ref 6.4–8.3)
Total Bilirubin: <0.2 mg/dL (ref 0.20–1.20)

## 2014-07-25 LAB — CBC WITH DIFFERENTIAL/PLATELET
BASO%: 0.9 % (ref 0.0–2.0)
Basophils Absolute: 0.1 10*3/uL (ref 0.0–0.1)
EOS%: 2.8 % (ref 0.0–7.0)
Eosinophils Absolute: 0.2 10*3/uL (ref 0.0–0.5)
HCT: 33.2 % — ABNORMAL LOW (ref 34.8–46.6)
HGB: 10.5 g/dL — ABNORMAL LOW (ref 11.6–15.9)
LYMPH%: 25 % (ref 14.0–49.7)
MCH: 27.3 pg (ref 25.1–34.0)
MCHC: 31.8 g/dL (ref 31.5–36.0)
MCV: 85.9 fL (ref 79.5–101.0)
MONO#: 0.5 10*3/uL (ref 0.1–0.9)
MONO%: 8.3 % (ref 0.0–14.0)
NEUT#: 4.1 10*3/uL (ref 1.5–6.5)
NEUT%: 63 % (ref 38.4–76.8)
Platelets: 349 10*3/uL (ref 145–400)
RBC: 3.86 10*6/uL (ref 3.70–5.45)
RDW: 14.1 % (ref 11.2–14.5)
WBC: 6.5 10*3/uL (ref 3.9–10.3)
lymph#: 1.6 10*3/uL (ref 0.9–3.3)

## 2014-07-25 MED ORDER — SODIUM CHLORIDE 0.9 % IJ SOLN
10.0000 mL | INTRAMUSCULAR | Status: DC | PRN
  Administered 2014-07-25: 10 mL via INTRAVENOUS
  Filled 2014-07-25: qty 10

## 2014-07-25 MED ORDER — HEPARIN SOD (PORK) LOCK FLUSH 100 UNIT/ML IV SOLN
500.0000 [IU] | Freq: Once | INTRAVENOUS | Status: AC
  Administered 2014-07-25: 500 [IU] via INTRAVENOUS
  Filled 2014-07-25: qty 5

## 2014-07-25 NOTE — Patient Instructions (Signed)

## 2014-07-25 NOTE — Telephone Encounter (Signed)
Pt confirmed labs/ov per 01/21 POF, gave pt AVS... KJ °

## 2014-07-25 NOTE — Progress Notes (Signed)
[facility] ONCOLOGY OFFICE PROGRESS NOTE    DIAGNOSIS: Metastatic colon cancer to liver   MOLECULAR FEATURES ([facility]):   No chief complaint on file.   CURRENT TREATMENT:  Oral Xeloda 1500mg (850mg/m2) bid, 2 weeks on, one week off, as maintenance therapy started 07/16/2014     Colorectal cancer, stage IV   05/22/2013 - 05/27/2013 Hospital Admission A/w abdominal distention and constipation for 2 days CT scan of her abdomen and pelvis showed area of bowel wall thickening at the rectosigmoid junction concerning for malignancy.   05/22/2013 Imaging CT of Abdomen: bowel wall thickening with shouldering of the proximal and distal margins, involving therectosigmoid junction, supicous liver spleen mets. Colonsocopy recommended.    05/23/2013 Tumor Marker CEA 225.7   05/24/2013 Imaging MRI of abdomen: 3.7 x 4.4 cm enhancing lesion along the superior spleen, indeterminate but worrisome for metastasis.2.7 x 3.9 cm enhancing lesion in the medial segment left hepatic lobe,    05/25/2013 Pathology Results Diagnosis Rectum, biopsy, proximal - INVASIVE ADENOCARCINOMA.   05/25/2013 Procedure Flex sig: (Dr. Jacobs). 5-6 cm obstructing rectosigmoid mass distal end 10 cm from anal verge; mass biopsed and then stented 9 cm long, 22 cm diameter uncovered SEM.    05/25/2013 Initial Diagnosis Colorectal cancer, stage IV   06/25/2013 Procedure R port a cath placement.   07/09/2013 - 08/20/2013 Chemotherapy FOLFOX q 2 weeks started.  Not elgible for clinical trial and bevacimab based on stent and high risk for perforation. She received a total of 4 cycles.    07/30/2013 Tumor Marker KRAS positive.  p53, APC identified. Mismatch Repair (MMR) preserved.    08/06/2013 Adverse Reaction Complains of darkening of her hands with peeling.    08/27/2013 Family History She was seen by Genetic couselor.  She declined testing today, but will call and reschedule an appointment should she decide to pursue  testing.   09/07/2013 Imaging CT abdomen: Mixed response to therapy. Response to therapy of hepatic metastasis. right hepatic hemangiomas are again identified. Other too small to characterize liver lesions are felt to be similar but are indeterminate.. Enlargement of a splenic lesions   09/13/2013 - 06/10/2014 Chemotherapy Second line chemo FOLFIRI for a total of 16 cycles    09/13/2013 Tumor Marker CEA 33.5   09/13/2013 Treatment Plan Change Reviewed scans consistent with mixed response.  Switch to FOLFIRI, she received a total of 16 cycles, with a few breaks due to hospitalization and chemo holiday.    10/29/2013 Adverse Reaction Patient reports intense abdominal cramping lasting the first few days on chemotherapy.  Starting Hyoscyamine 0.125 mg q 6 hours prn.    11/15/2013 - 11/15/2013 Hospital Admission Patient presented to ED with migrated stent.  Stent retrieved by Dr. Jacobs.  Imaging negative for perforation.   Discussed in conference with referral to Dr. Rosenbower made.    11/23/2013 Imaging CT Chest. 1. Stable left lower lobe 5 mm pulmonary nodule. 2. Interval increase in size splenic mass is concerning formetastatic lesion.3. Interval calcification of the left hepatic lobe metastasis. Noevidence of active residual disease by CT.   07/01/2014 Imaging CT restaging scan showed stable disease, improved liver lesion.    07/16/2014 -  Chemotherapy maintenance chemo with capacitain 1000mg/m2 (1800mg) bid started, dose decresed to 1500mg bid on C1D8 due to tolerance issue. Pt declined surgery.     INTERVAL HISTORY:  [name] [name] 63 y.o. female with a history of Stage IV colon cancer is here for follow-up.  She started Xaloda on 1.12.2016,   and developed nausea, vomiting, constipation, fatigue and poor appetite after a few days. She called one week later, and changed the dose to 1544m bid. She tolerated the lower dose much better. She is off this week. She is doing well overall, has good appetite and  eating eating. No nausea, pain or other complaints. She goes to gym for exercise on a daily basis.   MEDICAL HISTORY: Past Medical History  Diagnosis Date  . Anxiety   . Colon cancer     dx'd 2014    ALLERGIES:  is allergic to codeine.  MEDICATIONS: has a current medication list which includes the following prescription(s): capecitabine, capecitabine, lidocaine-prilocaine, loperamide, lorazepam, ondansetron, polyethylene glycol powder, and prochlorperazine, and the following Facility-Administered Medications: sodium chloride.  SURGICAL HISTORY:  Past Surgical History  Procedure Laterality Date  . Abdominal hysterectomy  2005  . Cesarean section  1976  . Flexible sigmoidoscopy N/A 05/25/2013    Procedure: FLEXIBLE SIGMOIDOSCOPY;  Surgeon: DMilus Banister MD;  Location: [facility];  Service: Endoscopy;  Laterality: N/A;  needs floro  . Colonic stent placement N/A 05/25/2013    Procedure: COLONIC STENT PLACEMENT;  Surgeon: DMilus Banister MD;  Location: [facility];  Service: Endoscopy;  Laterality: N/A;  . Portacath placement Right 06/25/2013    Procedure: ULTRA SOUND GUIDED INSERTION PORT-A-CATH;  Surgeon: TOdis Hollingshead MD;  Location: [facility]  Service: General;  Laterality: Right;    REVIEW OF SYSTEMS:   Constitutional: Denies fevers, chills or abnormal weight loss Eyes: Denies blurriness of vision Ears, nose, mouth, throat, and face: Denies mucositis or sore throat Respiratory: Denies cough, dyspnea or wheezes Cardiovascular: Denies palpitation, chest discomfort or lower extremity swelling Gastrointestinal:  Denies nausea, heartburn or change in bowel habits Skin: Denies abnormal skin rashes Lymphatics: Denies new lymphadenopathy or easy bruising Neurological:Denies numbness, tingling or new weaknesses Behavioral/Psych: Mood is stable, no new changes  All other systems were reviewed with the patient and are negative.  PHYSICAL EXAMINATION: ECOG  PERFORMANCE STATUS: 0-Asymptomatic  Blood pressure 133/50, pulse 94, temperature 98.2 F (36.8 C), temperature source Oral, resp. rate 18, height 5' 4" (1.626 m), weight 153 lb 4.8 oz (69.536 kg).  GENERAL:alert, no distress and comfortable; well-developed, well nourished.  SKIN: skin color, texture, turgor are normal, no rashes or significant lesions; + R Port a cath without tenderness; hyperpigmentation of her hands.  EYES: normal, Conjunctiva are pink and non-injected, sclera clear  OROPHARYNX:no exudate, no erythema and lips, buccal mucosa  NECK: supple, thyroid normal size, non-tender, without nodularity  LYMPH: no palpable lymphadenopathy in the cervical, axillary or supraclavicular  LUNGS: clear to auscultation and percussion with normal breathing effort  HEART: Tachycardic with regular rhythm and no murmurs and no lower extremity edema  ABDOMEN:abdomen soft, non-tender and normal bowel sounds Musculoskeletal:no cyanosis of digits and no clubbing  NEURO: alert & oriented x 3 with fluent speech, no focal motor/sensory deficits EXTREMITIES: Positive for thickening of nail bed, mild pinkish discharge from the left third nail bed, mild tenderness, no skin erythema or swelling.  Labs:  CBC Latest Ref Rng 07/25/2014 07/01/2014 06/10/2014  WBC 3.9 - 10.3 10e3/uL 6.5 4.9 4.5  Hemoglobin 11.6 - 15.9 g/dL 10.5(L) 11.2(L) 10.5(L)  Hematocrit 34.8 - 46.6 % 33.2(L) 34.6(L) 32.1(L)  Platelets 145 - 400 10e3/uL 349 286 278    CMP Latest Ref Rng 07/25/2014 07/01/2014 06/10/2014  Glucose 70 - 140 mg/dl 90 88 101  BUN 7.0 - 26.0 mg/dL 13.5 11.0 10.5  Creatinine 0.6 - 1.1 mg/dL 0.8 0.8 0.8  Sodium 136 - 145 mEq/L 136 138 138  Potassium 3.5 - 5.1 mEq/L 4.5 4.2 4.0  Chloride 96 - 112 mEq/L - - -  CO2 22 - 29 mEq/L 23 23 20(L)  Calcium 8.4 - 10.4 mg/dL 11.0(H) 10.9(H) 11.1(H)  Total Protein 6.4 - 8.3 g/dL 7.7 7.9 7.4  Total Bilirubin 0.20 - 1.20 mg/dL <0.20 0.22 <0.20  Alkaline Phos 40 - 150 U/L 64  77 75  AST 5 - 34 U/L 12 16 14  ALT 0 - 55 U/L 6 13 13      CEA  Status: Finalresult Visible to patient:  Not Released Nextappt: 06/12/2014 at 12:00 PM in Oncology ([facility]) Dx:  Colon cancer           Ref Range 8:30 AM  3wk ago  1mo ago  2mo ago     CEA 0.0 - 5.0 ng/mL 4.0 4.3 3.0 4.8       RADIOGRAPHIC STUDIES:  CT chest, abdomen and pelvis with iv contrast 07/01/2014 IMPRESSION: 1. No acute findings within the chest abdomen or pelvis. 2. Stable nonspecific small pulmonary nodules measuring up to 5 mm. 3. No change in size of splenic metastasis. 4. Increase and wall thickening an soft tissue stranding involving the sigmoid colon. Cannot rule out local tumor recurrence or local tumor. 5. Stable appearance of the liver. There are 2 hemangiomas in several liver cysts. Treated tumor in the left hepatic lobe has decreased in size from the previous exam.    ASSESSMENT: [name] [name] 63 y.o. female with a history of stage IV colon adenocarcinoma, KRAS mutation(+), on second line FOLFIRI since 08/2013, cycle 16 today.   1. Metastatic colorectal Cancer (mCRC) to liver, spleen, stable disease. -She had stable disease after second line of chemotherapy, now on Capecitabine maintenance. She is tolerating 15 mg twice daily well. Her blood counts are adequate, we'll start second cycle next week. -Restaging after 4 cycles. -I presented her case in our GI tumor board, Dr. Byerly feels surgical resection is an option. I spoke with patient she declined. She prefers to maintain her current quality of life.   2. S/p migrated colorectal stent, s/p retrieval on 05/14 -- She will follow up with GI/Surgery as needed.    3. Family history of Colon cancer.  --Based on her age and family history (sister deceased at age 46 from colon cancer), referral for genetic testing for Lynch syndrome, i.e. MSI or IHC, would be appropriate. We made a referral to  genetics. Her tumor was negative for MSI high (Lynch syndrome). She was seen on 02/23 but declined further testing for genetic syndromes presently.   4. 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  -chronic elevated Ca, down to 10.9 today. She knows to drink more fluids, avoid dehydration -zometa 4mg iv on 07/25/13 and 06/12/2014  PLAN -start second cycle  Capecitabine next week -RTC in 3 weeks  All questions were answered. The patient knows to call the clinic with any problems, questions or concerns. We can certainly see the patient much sooner if necessary.  I spent 20 minutes counseling the patient face to face. The total time spent in the appointment was 25 minutes.  Feng, Yan  07/25/2014  

## 2014-07-26 LAB — CEA: CEA: 5 ng/mL (ref 0.0–5.0)

## 2014-08-05 ENCOUNTER — Encounter (HOSPITAL_COMMUNITY): Payer: Self-pay | Admitting: Emergency Medicine

## 2014-08-05 ENCOUNTER — Emergency Department (HOSPITAL_COMMUNITY)
Admission: EM | Admit: 2014-08-05 | Discharge: 2014-08-05 | Disposition: A | Discharge status: Home / Self Care | Payer: Medicaid Other | Attending: Emergency Medicine | Admitting: Emergency Medicine

## 2014-08-05 ENCOUNTER — Emergency Department (HOSPITAL_COMMUNITY): Payer: Medicaid Other

## 2014-08-05 DIAGNOSIS — Z85038 Personal history of other malignant neoplasm of large intestine: Secondary | ICD-10-CM | POA: Diagnosis not present

## 2014-08-05 DIAGNOSIS — Z79899 Other long term (current) drug therapy: Secondary | ICD-10-CM | POA: Diagnosis not present

## 2014-08-05 DIAGNOSIS — Z90710 Acquired absence of both cervix and uterus: Secondary | ICD-10-CM | POA: Diagnosis not present

## 2014-08-05 DIAGNOSIS — Z87891 Personal history of nicotine dependence: Secondary | ICD-10-CM | POA: Insufficient documentation

## 2014-08-05 DIAGNOSIS — Z9889 Other specified postprocedural states: Secondary | ICD-10-CM | POA: Diagnosis not present

## 2014-08-05 DIAGNOSIS — K5900 Constipation, unspecified: Secondary | ICD-10-CM | POA: Insufficient documentation

## 2014-08-05 DIAGNOSIS — F419 Anxiety disorder, unspecified: Secondary | ICD-10-CM | POA: Insufficient documentation

## 2014-08-05 DIAGNOSIS — R109 Unspecified abdominal pain: Secondary | ICD-10-CM | POA: Diagnosis present

## 2014-08-05 LAB — COMPREHENSIVE METABOLIC PANEL
ALBUMIN: 3.6 g/dL (ref 3.5–5.2)
ALK PHOS: 63 U/L (ref 39–117)
ALT: 11 U/L (ref 0–35)
ANION GAP: 10 (ref 5–15)
AST: 23 U/L (ref 0–37)
BILIRUBIN TOTAL: 0.7 mg/dL (ref 0.3–1.2)
BUN: 10 mg/dL (ref 6–23)
CO2: 19 mmol/L (ref 19–32)
Calcium: 10.6 mg/dL — ABNORMAL HIGH (ref 8.4–10.5)
Chloride: 110 mmol/L (ref 96–112)
Creatinine, Ser: 0.72 mg/dL (ref 0.50–1.10)
GFR calc Af Amer: >90 mL/min (ref >90)
Glucose, Bld: 107 mg/dL — ABNORMAL HIGH (ref 70–99)
Potassium: 4.1 mmol/L (ref 3.5–5.1)
Sodium: 139 mmol/L (ref 135–145)
Total Protein: 7.9 g/dL (ref 6.0–8.3)

## 2014-08-05 LAB — CBC WITH DIFFERENTIAL/PLATELET
Basophils Absolute: 0 10*3/uL (ref 0.0–0.1)
Basophils Relative: 0 % (ref 0–1)
EOS ABS: 0 10*3/uL (ref 0.0–0.7)
Eosinophils Relative: 1 % (ref 0–5)
HCT: 32.4 % — ABNORMAL LOW (ref 36.0–46.0)
Hemoglobin: 10.5 g/dL — ABNORMAL LOW (ref 12.0–15.0)
Lymphocytes Relative: 13 % (ref 12–46)
Lymphs Abs: 0.7 10*3/uL (ref 0.7–4.0)
MCH: 28.2 pg (ref 26.0–34.0)
MCHC: 32.4 g/dL (ref 30.0–36.0)
MCV: 87.1 fL (ref 78.0–100.0)
MONO ABS: 0.4 10*3/uL (ref 0.1–1.0)
MONOS PCT: 7 % (ref 3–12)
NEUTROS PCT: 79 % — AB (ref 43–77)
Neutro Abs: 4.2 10*3/uL (ref 1.7–7.7)
Platelets: 221 10*3/uL (ref 150–400)
RBC: 3.72 MIL/uL — AB (ref 3.87–5.11)
RDW: 14.6 % (ref 11.5–15.5)
WBC: 5.4 10*3/uL (ref 4.0–10.5)

## 2014-08-05 MED ORDER — HYDROCODONE-ACETAMINOPHEN 5-325 MG PO TABS: 1.0000 | ORAL_TABLET | Freq: Four times a day (QID) | ORAL | Status: DC | PRN

## 2014-08-05 MED ORDER — MORPHINE SULFATE 4 MG/ML IJ SOLN
4.0000 mg | Freq: Once | INTRAMUSCULAR | Status: AC
  Administered 2014-08-05: 4 mg via INTRAVENOUS
  Filled 2014-08-05: qty 1

## 2014-08-05 MED ORDER — PROMETHAZINE HCL 25 MG/ML IJ SOLN
12.5000 mg | Freq: Once | INTRAMUSCULAR | Status: AC
  Administered 2014-08-05: 12.5 mg via INTRAVENOUS
  Filled 2014-08-05: qty 1

## 2014-08-05 MED ORDER — SODIUM CHLORIDE 0.9 % IV BOLUS (SEPSIS)
1000.0000 mL | Freq: Once | INTRAVENOUS | Status: AC
  Administered 2014-08-05: 1000 mL via INTRAVENOUS

## 2014-08-05 MED ORDER — ONDANSETRON HCL 4 MG/2ML IJ SOLN
4.0000 mg | Freq: Once | INTRAMUSCULAR | Status: AC
  Administered 2014-08-05: 4 mg via INTRAVENOUS
  Filled 2014-08-05: qty 2

## 2014-08-05 NOTE — ED Provider Notes (Signed)
CSN: [id number]     Arrival date & time 08/05/14  44 History   First MD Initiated Contact with Patient 08/05/14 1023     Chief Complaint  Patient presents with  . Constipation  . Abdominal Pain     (Consider location/radiation/quality/duration/timing/severity/associated sxs/prior Treatment) HPI Comments: Patient is a 64 year old female with history of colon cancer treated with chemotherapy. She is currently taking Xeloda and believes this is causing her to feel constipated. She states she has not had a bowel movement in several days. She does report small stools and passing flatus, however still feels bloated and is if she has to have a bowel movement. She denies any fevers or chills. She denies any urinary complaints.  Patient is a 64 y.o. female presenting with constipation and abdominal pain. The history is provided by the patient.  Constipation Severity:  Moderate Time since last bowel movement:  3 days Timing:  Constant Progression:  Worsening Chronicity:  New Context: medication   Context: not dehydration   Relieved by:  Nothing Worsened by:  Nothing tried Ineffective treatments:  None tried Associated symptoms: abdominal pain   Abdominal Pain Associated symptoms: constipation     Past Medical History  Diagnosis Date  . Anxiety   . Colon cancer     dx'd 2014   Past Surgical History  Procedure Laterality Date  . Abdominal hysterectomy  2005  . Cesarean section  1976  . Flexible sigmoidoscopy N/A 05/25/2013    Procedure: FLEXIBLE SIGMOIDOSCOPY;  Surgeon: [name], MD;  Location: [facility];  Service: Endoscopy;  Laterality: N/A;  needs floro  . Colonic stent placement N/A 05/25/2013    Procedure: COLONIC STENT PLACEMENT;  Surgeon: [name], MD;  Location: [facility];  Service: Endoscopy;  Laterality: N/A;  . Portacath placement Right 06/25/2013    Procedure: ULTRA SOUND GUIDED INSERTION PORT-A-CATH;  Surgeon: [name], MD;  Location: [facility];  Service: General;  Laterality: Right;   Family History  Problem Relation Age of Onset  . Colon cancer Sister 7  . Diabetes Neg Hx   . Thyroid disease Neg Hx   . Breast cancer Maternal Aunt   . Prostate cancer Maternal Uncle   . Bone cancer Maternal Grandfather   . Prostate cancer Maternal Uncle    History  Substance Use Topics  . Smoking status: Former Smoker -- 1 years    Types: Cigarettes    Quit date: 05/24/1971  . Smokeless tobacco: Never Used  . Alcohol Use: No   OB History    No data available     Review of Systems  Gastrointestinal: Positive for abdominal pain and constipation.  All other systems reviewed and are negative.     Allergies  Codeine  Home Medications   Prior to Admission medications   Medication Sig Start Date End Date Taking? Authorizing Provider  capecitabine (XELODA) 150 MG tablet Take 2 tablets (300 mg total) by mouth 2 (two) times daily after a meal. Patient not taking: Reported on 07/08/2014 07/08/14   Ladell Pier, MD  capecitabine (XELODA) 500 MG tablet Take 3 tablets (1,500 mg total) by mouth 2 (two) times daily after a meal. Patient not taking: Reported on 07/08/2014 07/08/14   Ladell Pier, MD  lidocaine-prilocaine (EMLA) cream Apply 1 application topically as needed. Apply to port site one hour before treatment and cover with plastic wrap. 07/02/13   Concha Norway, MD  loperamide (IMODIUM A-D) 2 MG tablet Take 2  at onset of diarrhea, then 1 every 2hrs until 12hr without a BM. May take 2 tab every 4hrs at bedtime. If diarrhea recurs repeat. 09/13/13   Concha Norway, MD  LORazepam (ATIVAN) 0.5 MG tablet Take 1 tablet (0.5 mg total) by mouth every 8 (eight) hours as needed (nausea/vomiting). Patient not taking: Reported on 07/08/2014 04/22/14   Aasim Marla Roe, MD  ondansetron (ZOFRAN) 8 MG tablet Take 8mg  by mouth every 12 hours as needed for nausea/vomiting.  Take 8 mg by mouth  30 minutes before taking chemo pills, and  as needed for nausea/vomiting. 07/15/14   Truitt Merle, MD  polyethylene glycol powder (GLYCOLAX/MIRALAX) powder TAKE 34 G (2 DOSES) BY MOUTH DAILY AS NEEDED FOR MILD CONSTIPATION OR MODERATE CONSTIPATION 03/03/14   Concha Norway, MD  prochlorperazine (COMPAZINE) 10 MG tablet TAKE 1 TABLET (10 MG TOTAL) BY MOUTH EVERY 6 (SIX) HOURS AS NEEDED (NAUSEA OR VOMITING). Patient not taking: Reported on 07/08/2014 01/01/14   Concha Norway, MD   Pulse 97  Temp(Src) 98.1 F (36.7 C) (Oral)  Resp 20  SpO2 100% Physical Exam  Constitutional: She is oriented to person, place, and time. She appears well-developed and well-nourished. No distress.  HENT:  Head: Normocephalic and atraumatic.  Neck: Normal range of motion. Neck supple.  Cardiovascular: Normal rate and regular rhythm.  Exam reveals no gallop and no friction rub.   No murmur heard. Pulmonary/Chest: Effort normal and breath sounds normal. No respiratory distress. She has no wheezes.  Abdominal: Soft. Bowel sounds are normal. She exhibits no distension. There is no tenderness.  Genitourinary:  There is only scant stool in the rectal vault that is brown in color.  Musculoskeletal: Normal range of motion.  Neurological: She is alert and oriented to person, place, and time.  Skin: Skin is warm and dry. She is not diaphoretic.  Nursing note and vitals reviewed.   ED Course  Procedures (including critical care time) Labs Review Labs Reviewed  COMPREHENSIVE METABOLIC PANEL  CBC WITH DIFFERENTIAL/PLATELET    Imaging Review No results found.   EKG Interpretation None      MDM   Final diagnoses:  Constipation    Patient is a 64 year old female with history of colon cancer. She presents with constipation and abdominal bloating. Her laboratory studies are reassuring and obstruction series reveals moderate stool burden without evidence for obstruction. She will be treated with magnesium citrate and when necessary return.    Veryl Speak,  MD 08/05/14 1254

## 2014-08-05 NOTE — ED Notes (Signed)
Patient complianing of abdominal pain and constipation-states on a new chemo pill that is causing her to not be able to move stool-no relief with stool softeners

## 2014-08-05 NOTE — Progress Notes (Signed)
Cm discussed with pt that her assigned medicaid [place] access provider [facility]  Pt was unaware Confirms she did not "choose a doctor" and was assigned one by medicaid. Pt inquired about pcp contact information Informed her the information will be listed in her d/c instructions/follow up instructions Pt voiced appreciation and understanding

## 2014-08-05 NOTE — ED Notes (Signed)
Assisted patient to bathroom, then will discharge.

## 2014-08-05 NOTE — Progress Notes (Signed)
[facility] assigned by [facility] access and followed by specialist hematology/oncology- Truitt Merle  EPIC updated

## 2014-08-05 NOTE — Discharge Instructions (Signed)
Magnesium citrate: Drank the entire 10 ounce bottle mixed with equal parts Sprite or Gatorade for relief of constipation.  Return to the emergency department if you develop severe abdominal pain, bloody stool, high fever, or other new and concerning symptoms.  Return to the ER or follow up with your primary Dr. in the next 2 days if not improving.   Constipation Constipation is when a person has fewer than three bowel movements a week, has difficulty having a bowel movement, or has stools that are dry, hard, or larger than normal. As people grow older, constipation is more common. If you try to fix constipation with medicines that make you have a bowel movement (laxatives), the problem may get worse. Long-term laxative use may cause the muscles of the colon to become weak. A low-fiber diet, not taking in enough fluids, and taking certain medicines may make constipation worse.  CAUSES   Certain medicines, such as antidepressants, pain medicine, iron supplements, antacids, and water pills.   Certain diseases, such as diabetes, irritable bowel syndrome (IBS), thyroid disease, or depression.   Not drinking enough water.   Not eating enough fiber-rich foods.   Stress or travel.   Lack of physical activity or exercise.   Ignoring the urge to have a bowel movement.   Using laxatives too much.  SIGNS AND SYMPTOMS   Having fewer than three bowel movements a week.   Straining to have a bowel movement.   Having stools that are hard, dry, or larger than normal.   Feeling full or bloated.   Pain in the lower abdomen.   Not feeling relief after having a bowel movement.  DIAGNOSIS  Your health care provider will take a medical history and perform a physical exam. Further testing may be done for severe constipation. Some tests may include:  A barium enema X-ray to examine your rectum, colon, and, sometimes, your small intestine.   A sigmoidoscopy to examine your lower  colon.   A colonoscopy to examine your entire colon. TREATMENT  Treatment will depend on the severity of your constipation and what is causing it. Some dietary treatments include drinking more fluids and eating more fiber-rich foods. Lifestyle treatments may include regular exercise. If these diet and lifestyle recommendations do not help, your health care provider may recommend taking over-the-counter laxative medicines to help you have bowel movements. Prescription medicines may be prescribed if over-the-counter medicines do not work.  HOME CARE INSTRUCTIONS   Eat foods that have a lot of fiber, such as fruits, vegetables, whole grains, and beans.  Limit foods high in fat and processed sugars, such as french fries, hamburgers, cookies, candies, and soda.   A fiber supplement may be added to your diet if you cannot get enough fiber from foods.   Drink enough fluids to keep your urine clear or pale yellow.   Exercise regularly or as directed by your health care provider.   Go to the restroom when you have the urge to go. Do not hold it.   Only take over-the-counter or prescription medicines as directed by your health care provider. Do not take other medicines for constipation without talking to your health care provider first.  [place] IF:   You have bright red blood in your stool.   Your constipation lasts for more than 4 days or gets worse.   You have abdominal or rectal pain.   You have thin, pencil-like stools.   You have unexplained weight loss. MAKE SURE  YOU:   Understand these instructions.  Will watch your condition.  Will get help right away if you are not doing well or get worse. Document Released: 03/19/2004 Document Revised: 06/26/2013 Document Reviewed: 04/02/2013 [facility] Patient Information 2015 [place], [place]. This information is not intended to replace advice given to you by your health care provider. Make sure you discuss any  questions you have with your health care provider.

## 2014-08-05 NOTE — ED Notes (Signed)
Bed: WA01 Expected date:  Expected time:  Means of arrival:  Comments: Cancer patient

## 2014-08-13 ENCOUNTER — Telehealth: Payer: Self-pay | Admitting: Hematology

## 2014-08-13 NOTE — Telephone Encounter (Signed)
Left message to confirm appointment for 02/12 @ 11:45.

## 2014-08-14 ENCOUNTER — Telehealth: Payer: Self-pay | Admitting: *Deleted

## 2014-08-14 NOTE — Telephone Encounter (Signed)
1600 Dr. Burr Medico reports she has given instructions for collaborative nurse to call patient.  No orders received by this nurse.

## 2014-08-14 NOTE — Telephone Encounter (Signed)
Spoke with pt and instructed pt to take Magnesium Citrate to help with constipation problem.  Instructed pt to also take Colace or Senokot-S daily once pt has good bowel movement.  Reinforced increased po fluids intake, eating fiber foods ( prunes, apple sauce, drink prune juice ).  Pt aware of f/u appt for 08/16/14.  Pt voiced understanding.

## 2014-08-14 NOTE — Telephone Encounter (Signed)
@  [id number] Provider input needed: Constipation, abdominal cramping  Patient Name: [name] [name]  MRN: [id number] DOB: Aug 09, 1950  Date: @TODAY @ Telephone: [phone] (home)  CSN: [id number]   Allergies: is allergic to codeine.      Reason for call:  Chief Complaint  Patient presents with  . Constipation  . Abdominal Cramping       Patient last received chemotherapy/ treatment on: 06-10-2014   Patient was last seen in the office on 07-25-2014  Next appt is 08-16-2014      Is patient having fevers greater than 100.5? Yes []    No [x]    Comments:    Is patient having uncontrolled pain, or new pain? Yes [x]    No []    Comments:Abdominal cramping that comes and goes.  Uses heating pad.    Is patient having new back pain that changes with position Yes []                                   (worsens or eases when laying down?) No [x]    Comments:     Is patient able to eat and drink? Yes [x]    No []    Comments:"Not eating a lot because I feel full but I do drink 64 oz or more of water."     Is patient able to pass stool without difficulty?  Yes [x]    No []    Comments:     Is patient having uncontrolled nausea?  Yes []    No [x]    Comments: Reports nausea last week but no further nausea.  Reports she went to ED last week and did not have a blockage or obstruction, had a lot of gas.  Used Magnesium Citrate with relief.  Last good BM was 08-12-2014.  Yesterday's bm was soft but too small to amount to anything."  My stomach has cramps taht come and go so I am wondering if there is anything else I can have prescribed because the Miralax isn't working.  I take Miralax twice a day but have not taken it yet today.     Summary Based on the above information advised patient to  Await return call to [phone].   @[facility] @  08/14/2014, 10:20 AM

## 2014-08-16 ENCOUNTER — Telehealth: Payer: Self-pay | Admitting: Hematology

## 2014-08-16 ENCOUNTER — Ambulatory Visit (HOSPITAL_BASED_OUTPATIENT_CLINIC_OR_DEPARTMENT_OTHER): Payer: Medicaid Other | Admitting: Hematology

## 2014-08-16 ENCOUNTER — Other Ambulatory Visit (HOSPITAL_BASED_OUTPATIENT_CLINIC_OR_DEPARTMENT_OTHER): Payer: Medicaid Other

## 2014-08-16 ENCOUNTER — Ambulatory Visit: Payer: Medicaid Other | Admitting: Hematology

## 2014-08-16 ENCOUNTER — Ambulatory Visit (HOSPITAL_BASED_OUTPATIENT_CLINIC_OR_DEPARTMENT_OTHER): Payer: Medicaid Other

## 2014-08-16 VITALS — BP 133/62 | HR 116 | Temp 98.4°F | Resp 20 | Ht 64.0 in | Wt 153.0 lb

## 2014-08-16 DIAGNOSIS — C189 Malignant neoplasm of colon, unspecified: Secondary | ICD-10-CM

## 2014-08-16 DIAGNOSIS — C787 Secondary malignant neoplasm of liver and intrahepatic bile duct: Secondary | ICD-10-CM

## 2014-08-16 DIAGNOSIS — C7889 Secondary malignant neoplasm of other digestive organs: Secondary | ICD-10-CM

## 2014-08-16 DIAGNOSIS — C19 Malignant neoplasm of rectosigmoid junction: Secondary | ICD-10-CM

## 2014-08-16 DIAGNOSIS — Z95828 Presence of other vascular implants and grafts: Secondary | ICD-10-CM

## 2014-08-16 DIAGNOSIS — Z8371 Family history of colonic polyps: Secondary | ICD-10-CM

## 2014-08-16 LAB — CBC WITH DIFFERENTIAL/PLATELET
BASO%: 1.1 % (ref 0.0–2.0)
Basophils Absolute: 0.1 10*3/uL (ref 0.0–0.1)
EOS%: 1.9 % (ref 0.0–7.0)
Eosinophils Absolute: 0.1 10*3/uL (ref 0.0–0.5)
HEMATOCRIT: 34.6 % — AB (ref 34.8–46.6)
HGB: 10.9 g/dL — ABNORMAL LOW (ref 11.6–15.9)
LYMPH#: 1.5 10*3/uL (ref 0.9–3.3)
LYMPH%: 22.9 % (ref 14.0–49.7)
MCH: 27 pg (ref 25.1–34.0)
MCHC: 31.5 g/dL (ref 31.5–36.0)
MCV: 85.9 fL (ref 79.5–101.0)
MONO#: 0.7 10*3/uL (ref 0.1–0.9)
MONO%: 9.9 % (ref 0.0–14.0)
NEUT%: 64.2 % (ref 38.4–76.8)
NEUTROS ABS: 4.3 10*3/uL (ref 1.5–6.5)
Platelets: 325 10*3/uL (ref 145–400)
RBC: 4.03 10*6/uL (ref 3.70–5.45)
RDW: 15 % — ABNORMAL HIGH (ref 11.2–14.5)
WBC: 6.6 10*3/uL (ref 3.9–10.3)

## 2014-08-16 LAB — COMPREHENSIVE METABOLIC PANEL (CC13)
ALBUMIN: 3.5 g/dL (ref 3.5–5.0)
ALT: 7 U/L (ref 0–55)
AST: 14 U/L (ref 5–34)
Alkaline Phosphatase: 60 U/L (ref 40–150)
Anion Gap: 8 mEq/L (ref 3–11)
BUN: 10.8 mg/dL (ref 7.0–26.0)
CO2: 24 mEq/L (ref 22–29)
Calcium: 11 mg/dL — ABNORMAL HIGH (ref 8.4–10.4)
Chloride: 107 mEq/L (ref 98–109)
Creatinine: 0.8 mg/dL (ref 0.6–1.1)
EGFR: >90 mL/min/{1.73_m2} (ref >90)
Glucose: 104 mg/dl (ref 70–140)
POTASSIUM: 4 meq/L (ref 3.5–5.1)
Sodium: 138 mEq/L (ref 136–145)
Total Bilirubin: 0.26 mg/dL (ref 0.20–1.20)
Total Protein: 7.9 g/dL (ref 6.4–8.3)

## 2014-08-16 MED ORDER — HEPARIN SOD (PORK) LOCK FLUSH 100 UNIT/ML IV SOLN
500.0000 [IU] | Freq: Once | INTRAVENOUS | Status: AC
  Administered 2014-08-16: 500 [IU] via INTRAVENOUS
  Filled 2014-08-16: qty 5

## 2014-08-16 MED ORDER — NYSTATIN 100000 UNIT/ML MT SUSP: 5.0000 mL | Freq: Four times a day (QID) | OROMUCOSAL | Status: DC

## 2014-08-16 MED ORDER — SODIUM CHLORIDE 0.9 % IJ SOLN
10.0000 mL | INTRAMUSCULAR | Status: DC | PRN
  Administered 2014-08-16 (×2): 10 mL via INTRAVENOUS
  Filled 2014-08-16: qty 10

## 2014-08-16 MED ORDER — CAPECITABINE 500 MG PO TABS: 1500.0000 mg | ORAL_TABLET | Freq: Two times a day (BID) | ORAL | Status: DC

## 2014-08-16 NOTE — Progress Notes (Signed)
[facility] OFF[facility] PROGRESS NOTE    DIAGNOSIS: Metastatic colon cancer to liver   MOLECULAR FEATURES ([facility]):   Chief complaint: Follow-up colon cancer  CURRENT TREATMENT:  Oral Xeloda $RemoveBef'1500mg'FimOFBIstm$ '850mg'$ /m2) bid, 2 weeks on, one week off, as maintenance therapy started 07/16/2014     Colorectal cancer, stage IV   05/22/2013 - 05/27/2013 Hospital Admission A/w abdominal distention and constipation for 2 days CT scan of her abdomen and pelvis showed area of bowel wall thickening at the rectosigmoid junction concerning for malignancy.   05/22/2013 Imaging CT of Abdomen: bowel wall thickening with shouldering of the proximal and distal margins, involving therectosigmoid junction, supicous liver spleen mets. Colonsocopy recommended.    05/23/2013 Tumor Marker CEA 225.7   05/24/2013 Imaging MRI of abdomen: 3.7 x 4.4 cm enhancing lesion along the superior spleen, indeterminate but worrisome for metastasis.2.7 x 3.9 cm enhancing lesion in the medial segment left hepatic lobe,    05/25/2013 Pathology Results Diagnosis Rectum, biopsy, proximal - INVASIVE ADENOCARCINOMA.   05/25/2013 Procedure Flex sig: (Dr. Ardis Hughs). 5-6 cm obstructing rectosigmoid mass distal end 10 cm from anal verge; mass biopsed and then stented 9 cm long, 22 cm diameter uncovered SEM.    05/25/2013 Initial Diagnosis Colorectal cancer, stage IV   06/25/2013 Procedure R port a cath placement.   07/09/2013 - 08/20/2013 Chemotherapy FOLFOX q 2 weeks started.  Not elgible for clinical trial and bevacimab based on stent and high risk for perforation. She received a total of 4 cycles.    07/30/2013 Tumor Marker KRAS positive.  p53, APC identified. Mismatch Repair (MMR) preserved.    08/06/2013 Adverse Reaction Complains of darkening of her hands with peeling.    08/27/2013 Family History She was seen by Genetic couselor.  She declined testing today, but will call and reschedule an appointment should she decide to  pursue testing.   09/07/2013 Imaging CT abdomen: Mixed response to therapy. Response to therapy of hepatic metastasis. right hepatic hemangiomas are again identified. Other too small to characterize liver lesions are felt to be similar but are indeterminate.. Enlargement of a splenic lesions   09/13/2013 - 06/10/2014 Chemotherapy Second line chemo FOLFIRI for a total of 16 cycles    09/13/2013 Tumor Marker CEA 33.5   09/13/2013 Treatment Plan Change Reviewed scans consistent with mixed response.  Switch to FOLFIRI, she received a total of 16 cycles, with a few breaks due to hospitalization and chemo holiday.    10/29/2013 Adverse Reaction Patient reports intense abdominal cramping lasting the first few days on chemotherapy.  Starting Hyoscyamine 0.125 mg q 6 hours prn.    11/15/2013 - 11/15/2013 Hospital Admission Patient presented to ED with migrated stent.  Stent retrieved by Dr. Ardis Hughs.  Imaging negative for perforation.   Discussed in conference with referral to Dr. Zella Richer made.    11/23/2013 Imaging CT Chest. 1. Stable left lower lobe 5 mm pulmonary nodule. 2. Interval increase in size splenic mass is concerning formetastatic lesion.3. Interval calcification of the left hepatic lobe metastasis. Noevidence of active residual disease by CT.   07/01/2014 Imaging CT restaging scan showed stable disease, improved liver lesion.    07/16/2014 -  Chemotherapy maintenance chemo with capacitain $RemoveBeforeD'1000mg'zlyVWAcuSczawL$ /m2'1800mg'$ ) bid started, dose decresed to $RemoveBef'1500mg'qnOehwteru$  bid on C1D8 due to tolerance issue. Pt declined surgery.     INTERVAL HISTORY:  [name] [name] 64 y.o. female with a history of Stage IV colon cancer is here for follow-up.  She tolerated capecitabine 150 mg twice  daily well. She is off this week. She has some abdominal bloating, gassy, and constipation last week and went to emergency room. She was instructed to take laxatives for her constipation. She took new cough magnesium, which walks but she does not like the  taste. She had lost a bowel movement this morning. She still feels gassy, but no abdominal discomfort or pain, she otherwise feels well. She has decent appetite and energy level. No fever or chills.  MEDICAL HISTORY: Past Medical History  Diagnosis Date  . Anxiety   . Colon cancer     dx'd 2014    ALLERGIES:  is allergic to codeine.  MEDICATIONS: has a current medication list which includes the following prescription(s): capecitabine, capecitabine, hydrocodone-acetaminophen, lidocaine-prilocaine, loperamide, lorazepam, ondansetron, OVER THE COUNTER MEDICATION, polyethylene glycol powder, and prochlorperazine, and the following Facility-Administered Medications: sodium chloride.  SURGICAL HISTORY:  Past Surgical History  Procedure Laterality Date  . Abdominal hysterectomy  2005  . Cesarean section  1976  . Flexible sigmoidoscopy N/A 05/25/2013    Procedure: FLEXIBLE SIGMOIDOSCOPY;  Surgeon: [name], MD;  Location: [facility];  Service: Endoscopy;  Laterality: N/A;  needs floro  . Colonic stent placement N/A 05/25/2013    Procedure: COLONIC STENT PLACEMENT;  Surgeon: [name], MD;  Location: [facility];  Service: Endoscopy;  Laterality: N/A;  . Portacath placement Right 06/25/2013    Procedure: ULTRA SOUND GUIDED INSERTION PORT-A-CATH;  Surgeon: [name], MD;  Location: [place];  Service: General;  Laterality: Right;    REVIEW OF SYSTEMS:   Constitutional: Denies fevers, chills or abnormal weight loss Eyes: Denies blurriness of vision Ears, nose, mouth, throat, and face: Denies mucositis or sore throat Respiratory: Denies cough, dyspnea or wheezes Cardiovascular: Denies palpitation, chest discomfort or lower extremity swelling Gastrointestinal:  Denies nausea, heartburn or change in bowel habits Skin: Denies abnormal skin rashes Lymphatics: Denies new lymphadenopathy or easy bruising Neurological:Denies numbness, tingling or new  weaknesses Behavioral/Psych: Mood is stable, no new changes  All other systems were reviewed with the patient and are negative.  PHYSICAL EXAMINATION: ECOG PERFORMANCE STATUS: 0-Asymptomatic  Blood pressure 133/62, pulse 116, temperature 98.4 F (36.9 C), temperature source Oral, resp. rate 20, height $RemoveBe'5\' 4"'ZJGDYXmqO$  (1.626 m), weight 153 lb (69.4 kg).  GENERAL:alert, no distress and comfortable; well-developed, well nourished.  SKIN: skin color, texture, turgor are normal, no rashes or significant lesions; + R Port a cath without tenderness; hyperpigmentation of her hands.  EYES: normal, Conjunctiva are pink and non-injected, sclera clear  OROPHARYNX:no exudate, no erythema and lips, buccal mucosa  NECK: supple, thyroid normal size, non-tender, without nodularity  LYMPH: no palpable lymphadenopathy in the cervical, axillary or supraclavicular  LUNGS: clear to auscultation and percussion with normal breathing effort  HEART: Tachycardic with regular rhythm and no murmurs and no lower extremity edema  ABDOMEN:abdomen soft, non-tender and normal bowel sounds Musculoskeletal:no cyanosis of digits and no clubbing  NEURO: alert & oriented x 3 with fluent speech, no focal motor/sensory deficits EXTREMITIES: Positive for thickening of nail bed, mild pinkish discharge from the left third nail bed, mild tenderness, no skin erythema or swelling.  Labs:  CBC Latest Ref Rng 08/16/2014 08/05/2014 07/25/2014  WBC 3.9 - 10.3 10e3/uL 6.6 5.4 6.5  Hemoglobin 11.6 - 15.9 g/dL 10.9(L) 10.5(L) 10.5(L)  Hematocrit 34.8 - 46.6 % 34.6(L) 32.4(L) 33.2(L)  Platelets 145 - 400 10e3/uL 325 221 349    CMP Latest Ref Rng 08/05/2014 07/25/2014 07/01/2014  Glucose 70 -  99 mg/dL 107(H) 90 88  BUN 6 - 23 mg/dL 10 13.5 11.0  Creatinine 0.50 - 1.10 mg/dL 0.72 0.8 0.8  Sodium 135 - 145 mmol/L 139 136 138  Potassium 3.5 - 5.1 mmol/L 4.1 4.5 4.2  Chloride 96 - 112 mmol/L 110 - -  CO2 19 - 32 mmol/L 19 23 23   Calcium 8.4 - 10.5 mg/dL  10.6(H) 11.0(H) 10.9(H)  Total Protein 6.0 - 8.3 g/dL 7.9 7.7 7.9  Total Bilirubin 0.3 - 1.2 mg/dL 0.7 <0.20 0.22  Alkaline Phos 39 - 117 U/L 63 64 77  AST 0 - 37 U/L 23 12 16   ALT 0 - 35 U/L 11 6 13     CEA  Status: Finalresult Visible to patient:  Not Released Nextappt: None Dx:  Metastatic colon cancer to liver           Ref Range 3wk ago (07/25/14) 26mo ago (07/01/14) 59mo ago (06/10/14) 68mo ago (05/20/14)    CEA 0.0 - 5.0 ng/mL 5.0 4.6 4.0 4.3          RADIOGRAPHIC STUDIES:  CT chest, abdomen and pelvis with iv contrast 07/01/2014 IMPRESSION: 1. No acute findings within the chest abdomen or pelvis. 2. Stable nonspecific small pulmonary nodules measuring up to 5 mm. 3. No change in size of splenic metastasis. 4. Increase and wall thickening an soft tissue stranding involving the sigmoid colon. Cannot rule out local tumor recurrence or local tumor. 5. Stable appearance of the liver. There are 2 hemangiomas in several liver cysts. Treated tumor in the left hepatic lobe has decreased in size from the previous exam.    ASSESSMENT: Park Breed 64 y.o. female with a history of stage IV colon adenocarcinoma, KRAS mutation(+), on second line FOLFIRI since 08/2013, cycle 16 today.   1. Metastatic colorectal Cancer (mCRC) to liver, spleen, stable disease. -She had stable disease after second line of chemotherapy, now on Capecitabine maintenance. She is tolerating 1500 mg twice daily well. Her blood counts are adequate, we'll start second cycle next week. -Restaging after 4 cycles. -I presented her case in our GI tumor board, Dr. Barry Dienes feels surgical resection is an option. I spoke with patient she declined. She prefers to maintain her current quality of life.   2. S/p migrated colorectal stent, s/p retrieval on 05/14 -- She will follow up with GI/Surgery as needed.    3. Family history of Colon cancer.  --Based on her age and family history (sister  deceased at age 50 from colon cancer), referral for genetic testing for Lynch syndrome, i.e. MSI or IHC, would be appropriate. We made a referral to genetics. Her tumor was negative for MSI high (Lynch syndrome). She was seen on 02/23 but declined further testing for genetic syndromes presently.   4. 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  -chronic elevated Ca, stable. She knows to drink more fluids, avoid dehydration -zometa 4mg  iv on 07/25/13 and 06/12/2014. She had some abdominal discomfort and after Zometa infusion, and does not want more at this point.  5. Abdominal gassy and constipation -I suggest her use senna and stool softener.   PLAN -start second cycle  Capecitabine next week -RTC in 3 weeks  All questions were answered. The patient knows to call the clinic with any problems, questions or concerns. We can certainly see the patient much sooner if necessary.  I spent 20 minutes counseling the patient face to face. The total time spent in the appointment was 25 minutes.  Truitt Merle  08/16/2014  

## 2014-08-16 NOTE — Telephone Encounter (Signed)
Gave avs & calendar for March. °

## 2014-08-17 ENCOUNTER — Inpatient Hospital Stay (HOSPITAL_COMMUNITY)
Admission: EM | Admit: 2014-08-17 | Discharge: 2014-08-20 | DRG: 389 | Disposition: A | Discharge status: Home / Self Care | Payer: Medicaid Other | Attending: Internal Medicine | Admitting: Internal Medicine

## 2014-08-17 ENCOUNTER — Encounter (HOSPITAL_COMMUNITY): Payer: Self-pay | Admitting: Emergency Medicine

## 2014-08-17 ENCOUNTER — Emergency Department (HOSPITAL_COMMUNITY): Payer: Medicaid Other

## 2014-08-17 ENCOUNTER — Encounter: Payer: Self-pay | Admitting: Hematology

## 2014-08-17 DIAGNOSIS — F419 Anxiety disorder, unspecified: Secondary | ICD-10-CM | POA: Diagnosis present

## 2014-08-17 DIAGNOSIS — Z803 Family history of malignant neoplasm of breast: Secondary | ICD-10-CM | POA: Diagnosis not present

## 2014-08-17 DIAGNOSIS — E876 Hypokalemia: Secondary | ICD-10-CM | POA: Diagnosis present

## 2014-08-17 DIAGNOSIS — Z87891 Personal history of nicotine dependence: Secondary | ICD-10-CM

## 2014-08-17 DIAGNOSIS — Z90710 Acquired absence of both cervix and uterus: Secondary | ICD-10-CM

## 2014-08-17 DIAGNOSIS — Z79891 Long term (current) use of opiate analgesic: Secondary | ICD-10-CM

## 2014-08-17 DIAGNOSIS — D649 Anemia, unspecified: Secondary | ICD-10-CM | POA: Diagnosis present

## 2014-08-17 DIAGNOSIS — R911 Solitary pulmonary nodule: Secondary | ICD-10-CM | POA: Diagnosis present

## 2014-08-17 DIAGNOSIS — C787 Secondary malignant neoplasm of liver and intrahepatic bile duct: Secondary | ICD-10-CM | POA: Diagnosis present

## 2014-08-17 DIAGNOSIS — Z79899 Other long term (current) drug therapy: Secondary | ICD-10-CM | POA: Diagnosis not present

## 2014-08-17 DIAGNOSIS — K56609 Unspecified intestinal obstruction, unspecified as to partial versus complete obstruction: Secondary | ICD-10-CM | POA: Diagnosis present

## 2014-08-17 DIAGNOSIS — R1110 Vomiting, unspecified: Secondary | ICD-10-CM | POA: Diagnosis present

## 2014-08-17 DIAGNOSIS — C189 Malignant neoplasm of colon, unspecified: Secondary | ICD-10-CM

## 2014-08-17 DIAGNOSIS — Z800 Family history of malignant neoplasm of digestive organs: Secondary | ICD-10-CM

## 2014-08-17 DIAGNOSIS — Z885 Allergy status to narcotic agent status: Secondary | ICD-10-CM

## 2014-08-17 DIAGNOSIS — C19 Malignant neoplasm of rectosigmoid junction: Secondary | ICD-10-CM | POA: Diagnosis present

## 2014-08-17 DIAGNOSIS — K5660 Unspecified intestinal obstruction: Secondary | ICD-10-CM | POA: Diagnosis present

## 2014-08-17 DIAGNOSIS — K30 Functional dyspepsia: Secondary | ICD-10-CM | POA: Diagnosis present

## 2014-08-17 DIAGNOSIS — Z9221 Personal history of antineoplastic chemotherapy: Secondary | ICD-10-CM

## 2014-08-17 LAB — CBC WITH DIFFERENTIAL/PLATELET
BASOS PCT: 0 % (ref 0–1)
Basophils Absolute: 0 10*3/uL (ref 0.0–0.1)
EOS ABS: 0.1 10*3/uL (ref 0.0–0.7)
EOS PCT: 1 % (ref 0–5)
HEMATOCRIT: 36.9 % (ref 36.0–46.0)
Hemoglobin: 12.2 g/dL (ref 12.0–15.0)
LYMPHS PCT: 21 % (ref 12–46)
Lymphs Abs: 1.5 10*3/uL (ref 0.7–4.0)
MCH: 28.4 pg (ref 26.0–34.0)
MCHC: 33.1 g/dL (ref 30.0–36.0)
MCV: 86 fL (ref 78.0–100.0)
MONO ABS: 0.6 10*3/uL (ref 0.1–1.0)
Monocytes Relative: 9 % (ref 3–12)
NEUTROS PCT: 69 % (ref 43–77)
Neutro Abs: 4.8 10*3/uL (ref 1.7–7.7)
Platelets: 326 10*3/uL (ref 150–400)
RBC: 4.29 MIL/uL (ref 3.87–5.11)
RDW: 14.2 % (ref 11.5–15.5)
WBC: 6.9 10*3/uL (ref 4.0–10.5)

## 2014-08-17 LAB — COMPREHENSIVE METABOLIC PANEL
ALK PHOS: 59 U/L (ref 39–117)
ALT: 11 U/L (ref 0–35)
AST: 20 U/L (ref 0–37)
Albumin: 4 g/dL (ref 3.5–5.2)
Anion gap: 10 (ref 5–15)
BUN: 12 mg/dL (ref 6–23)
CALCIUM: 11.4 mg/dL — AB (ref 8.4–10.5)
CHLORIDE: 109 mmol/L (ref 96–112)
CO2: 21 mmol/L (ref 19–32)
Creatinine, Ser: 0.67 mg/dL (ref 0.50–1.10)
GFR calc non Af Amer: >90 mL/min (ref >90)
GLUCOSE: 106 mg/dL — AB (ref 70–99)
POTASSIUM: 3.7 mmol/L (ref 3.5–5.1)
Sodium: 140 mmol/L (ref 135–145)
Total Bilirubin: 0.6 mg/dL (ref 0.3–1.2)
Total Protein: 8.3 g/dL (ref 6.0–8.3)

## 2014-08-17 LAB — URINALYSIS, ROUTINE W REFLEX MICROSCOPIC
BILIRUBIN URINE: NEGATIVE
Glucose, UA: NEGATIVE mg/dL
HGB URINE DIPSTICK: NEGATIVE
Ketones, ur: 40 mg/dL — AB
LEUKOCYTES UA: NEGATIVE
Nitrite: NEGATIVE
PH: 6 (ref 5.0–8.0)
Protein, ur: NEGATIVE mg/dL
Specific Gravity, Urine: >1.046 — ABNORMAL HIGH (ref 1.005–1.030)
Urobilinogen, UA: 0.2 mg/dL (ref 0.0–1.0)

## 2014-08-17 LAB — LIPASE, BLOOD: LIPASE: 33 U/L (ref 11–59)

## 2014-08-17 MED ORDER — MORPHINE SULFATE 4 MG/ML IJ SOLN
4.0000 mg | Freq: Once | INTRAMUSCULAR | Status: AC
  Administered 2014-08-17: 4 mg via INTRAVENOUS

## 2014-08-17 MED ORDER — ONDANSETRON HCL 4 MG/2ML IJ SOLN
4.0000 mg | Freq: Once | INTRAMUSCULAR | Status: AC
  Administered 2014-08-17: 4 mg via INTRAVENOUS
  Filled 2014-08-17: qty 2

## 2014-08-17 MED ORDER — MORPHINE SULFATE 4 MG/ML IJ SOLN
INTRAMUSCULAR | Status: AC
  Administered 2014-08-17: 4 mg
  Filled 2014-08-17: qty 1

## 2014-08-17 MED ORDER — SODIUM CHLORIDE 0.9 % IV SOLN
INTRAVENOUS | Status: DC
  Administered 2014-08-17 – 2014-08-18 (×2): via INTRAVENOUS

## 2014-08-17 MED ORDER — IOHEXOL 300 MG/ML  SOLN
100.0000 mL | Freq: Once | INTRAMUSCULAR | Status: AC | PRN
  Administered 2014-08-17: 100 mL via INTRAVENOUS

## 2014-08-17 MED ORDER — ENOXAPARIN SODIUM 40 MG/0.4ML ~~LOC~~ SOLN
40.0000 mg | SUBCUTANEOUS | Status: DC
  Administered 2014-08-17: 40 mg via SUBCUTANEOUS
  Filled 2014-08-17 (×2): qty 0.4

## 2014-08-17 MED ORDER — FAMOTIDINE IN NACL 20-0.9 MG/50ML-% IV SOLN
20.0000 mg | Freq: Two times a day (BID) | INTRAVENOUS | Status: DC
  Administered 2014-08-17: 20 mg via INTRAVENOUS
  Filled 2014-08-17 (×2): qty 50

## 2014-08-17 MED ORDER — DICYCLOMINE HCL 10 MG/ML IM SOLN
20.0000 mg | Freq: Four times a day (QID) | INTRAMUSCULAR | Status: DC
  Administered 2014-08-17 (×2): 20 mg via INTRAMUSCULAR
  Filled 2014-08-17 (×7): qty 2

## 2014-08-17 MED ORDER — SODIUM CHLORIDE 0.9 % IV SOLN
1000.0000 mL | Freq: Once | INTRAVENOUS | Status: AC
  Administered 2014-08-17: 1000 mL via INTRAVENOUS

## 2014-08-17 MED ORDER — SODIUM CHLORIDE 0.9 % IV SOLN
1000.0000 mL | INTRAVENOUS | Status: DC
  Administered 2014-08-17: 1000 mL via INTRAVENOUS

## 2014-08-17 MED ORDER — IOHEXOL 300 MG/ML  SOLN
50.0000 mL | Freq: Once | INTRAMUSCULAR | Status: AC | PRN
  Administered 2014-08-17: 50 mL via ORAL

## 2014-08-17 MED ORDER — PANTOPRAZOLE SODIUM 40 MG IV SOLR
40.0000 mg | Freq: Two times a day (BID) | INTRAVENOUS | Status: DC
  Administered 2014-08-17: 40 mg via INTRAVENOUS
  Filled 2014-08-17 (×3): qty 40

## 2014-08-17 MED ORDER — ONDANSETRON HCL 4 MG PO TABS: 4.0000 mg | ORAL_TABLET | Freq: Four times a day (QID) | ORAL | Status: DC | PRN

## 2014-08-17 MED ORDER — HYDROMORPHONE HCL 1 MG/ML IJ SOLN
0.5000 mg | INTRAMUSCULAR | Status: AC | PRN
  Administered 2014-08-17 (×3): 0.5 mg via INTRAVENOUS
  Filled 2014-08-17 (×3): qty 1

## 2014-08-17 MED ORDER — MORPHINE SULFATE 2 MG/ML IJ SOLN
2.0000 mg | INTRAMUSCULAR | Status: DC | PRN
  Administered 2014-08-17: 2 mg via INTRAVENOUS
  Filled 2014-08-17: qty 1

## 2014-08-17 MED ORDER — IOHEXOL 300 MG/ML  SOLN: 50.0000 mL | Freq: Once | INTRAMUSCULAR | Status: DC | PRN

## 2014-08-17 MED ORDER — ONDANSETRON HCL 4 MG/2ML IJ SOLN
4.0000 mg | Freq: Four times a day (QID) | INTRAMUSCULAR | Status: DC | PRN
  Administered 2014-08-17 – 2014-08-19 (×3): 4 mg via INTRAVENOUS
  Filled 2014-08-17 (×3): qty 2

## 2014-08-17 NOTE — H&P (Addendum)
[facility] History and Physical  DARENDA FIKE IHK:[id number] DOB: 1951-01-03 DOA: 08/17/2014   PCP: [facility]    Chief Complaint: vomiting and abdominal pain  HPI: [name] [name] is a 64 y.o. female with stage 4 colon cancer being treated with chemo presents with vomiting starting last night. She is also having cramping of her abdomen. She has a known sigmoid narrowing and has declined surgery in the past but has been continuing chemotherapy. CT scan of the abdomen shows continued narrowing of the sigmoid colon with a significant amount of backup of stool throughout the colon. She has not had any vomiting in the past couple of hours and I have advised putting in an NG tube but she is hesitant to do this. She tells me that she is now reconsidering surgery and would like to speak with the surgeon.   General: The patient denies anorexia, fever, weight loss Cardiac: Denies chest pain, syncope, palpitations, pedal edema  Respiratory: Denies cough, shortness of breath, wheezing GI: + indigestion/heartburn, +abdominal pain GU: Denies hematuria, incontinence, dysuria  Musculoskeletal: Denies arthritis  Skin: Denies suspicious skin lesions Neurologic: Denies focal weakness or numbness, change in vision Psychiatry: Denies depression or anxiety. Hematologic: no easy bruising or bleeding   Past Medical History  Diagnosis Date  . Anxiety   . Colon cancer     dx'd 2014    Past Surgical History  Procedure Laterality Date  . Abdominal hysterectomy  2005  . Cesarean section  1976  . Flexible sigmoidoscopy N/A 05/25/2013    Procedure: FLEXIBLE SIGMOIDOSCOPY;  Surgeon: [name], MD;  Location: [facility];  Service: Endoscopy;  Laterality: N/A;  needs floro  . Colonic stent placement N/A 05/25/2013    Procedure: COLONIC STENT PLACEMENT;  Surgeon: [name], MD;  Location: [facility];  Service: Endoscopy;  Laterality: N/A;  . Portacath placement Right 06/25/2013     Procedure: ULTRA SOUND GUIDED INSERTION PORT-A-CATH;  Surgeon: [name], MD;  Location: [place];  Service: General;  Laterality: Right;    Social History: Does not smoke or drink Lives at  home with family    Allergies  Allergen Reactions  . Codeine Itching and Nausea And Vomiting    Family History  Problem Relation Age of Onset  . Colon cancer Sister 10  . Diabetes Neg Hx   . Thyroid disease Neg Hx   . Breast cancer Maternal Aunt   . Prostate cancer Maternal Uncle   . Bone cancer Maternal Grandfather   . Prostate cancer Maternal Uncle      Prior to Admission medications   Medication Sig Start Date End Date Taking? Authorizing Provider  capecitabine (XELODA) 500 MG tablet Take 3 tablets (1,500 mg total) by mouth 2 (two) times daily after a meal. 08/16/14  Yes Truitt Merle, MD  HYDROcodone-acetaminophen (NORCO) 5-325 MG per tablet Take 1-2 tablets by mouth every 6 (six) hours as needed. Patient taking differently: Take 1-2 tablets by mouth every 6 (six) hours as needed for moderate pain.  08/05/14  Yes Veryl Speak, MD  lidocaine-prilocaine (EMLA) cream Apply 1 application topically as needed. Apply to port site one hour before treatment and cover with plastic wrap. 07/02/13  Yes Concha Norway, MD  LORazepam (ATIVAN) 0.5 MG tablet Take 1 tablet (0.5 mg total) by mouth every 8 (eight) hours as needed (nausea/vomiting). 04/22/14  Yes Aasim Marla Roe, MD  nystatin (MYCOSTATIN) 100000 UNIT/ML suspension Take 5 mLs (500,000 Units total) by  mouth 4 (four) times daily. 08/16/14  Yes Truitt Merle, MD  ondansetron (ZOFRAN) 8 MG tablet Take 8mg  by mouth every 12 hours as needed for nausea/vomiting.  Take 8 mg by mouth  30 minutes before taking chemo pills, and as needed for nausea/vomiting. 07/15/14  Yes Truitt Merle, MD  OVER THE COUNTER MEDICATION Take 2 tablets by mouth daily as needed (constipation). Cleanse more.   Yes Historical Provider, MD  polyethylene glycol powder  (GLYCOLAX/MIRALAX) powder TAKE 34 G (2 DOSES) BY MOUTH DAILY AS NEEDED FOR MILD CONSTIPATION OR MODERATE CONSTIPATION 03/03/14  Yes Concha Norway, MD  Probiotic Product (PROBIOTIC PO) Take 1 tablet by mouth once.   Yes Historical Provider, MD  prochlorperazine (COMPAZINE) 10 MG tablet TAKE 1 TABLET (10 MG TOTAL) BY MOUTH EVERY 6 (SIX) HOURS AS NEEDED (NAUSEA OR VOMITING). 01/01/14  Yes Concha Norway, MD  loperamide (IMODIUM A-D) 2 MG tablet Take 2 at onset of diarrhea, then 1 every 2hrs until 12hr without a BM. May take 2 tab every 4hrs at bedtime. If diarrhea recurs repeat. Patient not taking: Reported on 08/16/2014 09/13/13   Concha Norway, MD     Physical Exam: Filed Vitals:   08/17/14 1136 08/17/14 1328 08/17/14 1558  BP: 130/67 137/79 127/49  Pulse: 102 87 98  Temp: 98 F (36.7 C)  98 F (36.7 C)  TempSrc: Oral  Oral  Resp: 20 18 17   SpO2: 100% 100% 99%     General:  Awake alert oriented 3 HEENT: Normocephalic and Atraumatic, Mucous membranes pink                PERRLA; EOM intact; No scleral icterus,                 Nares: Patent, Oropharynx: Clear, Fair Dentition                 Neck: FROM, no cervical lymphadenopathy, thyromegaly, carotid bruit or JVD;  Breasts: deferred CHEST WALL: No tenderness  CHEST: Normal respiration, clear to auscultation bilaterally  HEART: Regular rate and rhythm; no murmurs rubs or gallops  BACK: No kyphosis or scoliosis; no CVA tenderness  GI:  Abdomen distended, mild diffuse tenderness, minimal bowel sounds, no rebound tenderness Rectal Exam: deferred MSK: No cyanosis, clubbing, or edema Genitalia: not examined  SKIN:  no rash or ulceration  CNS: Alert and Oriented x 4, Nonfocal exam, CN 2-12 intact  Labs on Admission:  Basic Metabolic Panel:  Recent Labs Lab 08/16/14 1121 08/17/14 1201  NA 138 140  K 4.0 3.7  CL  --  109  CO2 24 21  GLUCOSE 104 106*  BUN 10.8 12  CREATININE 0.8 0.67  CALCIUM 11.0* 11.4*   Liver Function  Tests:  Recent Labs Lab 08/16/14 1121 08/17/14 1201  AST 14 20  ALT 7 11  ALKPHOS 60 59  BILITOT 0.26 0.6  PROT 7.9 8.3  ALBUMIN 3.5 4.0    Recent Labs Lab 08/17/14 1201  LIPASE 33   No results for input(s): AMMONIA in the last 168 hours. CBC:  Recent Labs Lab 08/16/14 1121 08/17/14 1201  WBC 6.6 6.9  NEUTROABS 4.3 4.8  HGB 10.9* 12.2  HCT 34.6* 36.9  MCV 85.9 86.0  PLT 325 326   Cardiac Enzymes: No results for input(s): CKTOTAL, CKMB, CKMBINDEX, TROPONINI in the last 168 hours.  BNP (last 3 results) No results for input(s): BNP in the last 8760 hours.  ProBNP (last 3 results) No results for input(s): PROBNP in  the last 8760 hours.  CBG: No results for input(s): GLUCAP in the last 168 hours.  Radiological Exams on Admission: Ct Abdomen Pelvis W Contrast  08/17/2014   CLINICAL DATA:  Right-sided abdominal cramping, emesis and constipation.  EXAM: CT ABDOMEN AND PELVIS WITH CONTRAST  TECHNIQUE: Multidetector CT imaging of the abdomen and pelvis was performed using the standard protocol following bolus administration of intravenous contrast.  CONTRAST:  144mL OMNIPAQUE IOHEXOL 300 MG/ML  SOLN  COMPARISON:  CT 07/01/2014  FINDINGS: Lower chest: Visualization of the lower thorax demonstrates a 5 mm left lower lobe pulmonary nodule (image 7; series 4), stable from prior examination. Dependent atelectasis no pleural effusion. Normal heart size.  Hepatobiliary: Liver is normal in size and contour. Unchanged large hemangioma within the right hepatic lobe. Unchanged partially calcified lesion within the left hepatic lobe. Scattered liver cysts are not significantly changed. Gallbladder is unremarkable.  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  Adrenals/Urinary Tract: Normal bilateral adrenal glands. Kidneys enhance symmetrically with contrast. Unchanged nonobstructing stone inferior pole left kidney. No hydronephrosis.  Stomach/Bowel: Oral contrast  material is demonstrated within the stomach and small bowel. No small bowel dilatation. There is a large amount of stool throughout the colon which is dilated. Small amount of fluid adjacent to the descending colon and within the pelvis. There is persistent asymmetric wall thickening involving the sigmoid colon. Focal narrowing of the distal sigmoid colon (image 78; series 2). Additionally there is suggestion of wall thickening of the descending colon and rectum.  Vascular/Lymphatic: Normal caliber abdominal aorta. No retroperitoneal lymphadenopathy.  Other: Small amount of fluid adjacent to the descending colon and within the pelvis.  Musculoskeletal: No aggressive or acute appearing osseous lesions.  IMPRESSION: There is focal narrowing of the distal sigmoid colon. Upstream from this focal narrowing there is a large amount of stool throughout the entire colon which is dilated. This focal narrowing is likely causing colonic obstruction. There is associated wall thickening of the descending colon and rectum, potentially secondary to superimposed colitis.  There is a small amount of free fluid adjacent to the descending colon and within the pelvis.  No significant interval change in size of 5 mm left lower lobe pulmonary nodule.   Electronically Signed   By: Lovey Newcomer M.D.   On: 08/17/2014 14:55     Assessment/Plan Principal Problem:   Bowel obstruction--  Rectal mass -The patient is now agreeable to have surgery done--still awaiting surgeon to come and evaluate the patient -We'll order morphine, Zofran, Pepcid and Protonix for indigestion -Have ordered an NG tube already  Active Problems:    Colorectal cancer, stage IV -Management per oncology - have requested oncology consult   Consulted:  General surgery  Code Status: Full code Family Communication:   DVT Prophylaxis:SCDs, Lovenox  Time spent: 32 min  [place], MD [facility]  If 7PM-7AM, please contact  night-coverage [email or web address] 08/17/2014, 5:23 PM

## 2014-08-17 NOTE — ED Notes (Signed)
Pt stated that she will call us when ready to get a sample. Will check back in a few minutes.

## 2014-08-17 NOTE — ED Notes (Addendum)
Pt is actively vomiting, unable to get urine sample at this time.

## 2014-08-17 NOTE — ED Notes (Signed)
MD at bedside. Hospitalist at bedside. 

## 2014-08-17 NOTE — Consult Note (Signed)
Re:   [name] [name] DOB:   07-02-1951 MRN:   [id number]  [facility] Consultation  ASSESSMENT AND PLAN: 1.  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  She had a colonic stent at one time and as recently as yesterday refused surgery.  She has been seen in the past by Dr. Zella Richer.  Options include:  1) do nothing (which she has done), 2) stent colon (she said that this is what she wants in Dr. Bertrum Sol last note of 01/01/2014), 3) diverting colostomy with possible resection of colon cancer.  At this time, I do not think that she is a candidate for a primary anastomosis.  She could be reversed later, depending on how she does.   Plan to consult GI tomorrow to discuss possible stent placement and consult oncology so Dr. Burr Medico can weigh in.  2.  Indeterminate left lower lung nodule - 5 mm.  3. History of anxiety.  Chief Complaint  Patient presents with  . Abdominal Pain  . Emesis   REFERRING PHYSICIAN: [name]  HISTORY OF PRESENT ILLNESS: [name] [name] is a 64 y.o. (DOB: [birth year])  AA  female whose primary care physician is [name] [name].  She comes to the [facility] ER with increasing abdominal pain. In the room are her husband, [name] [name], friends - [name] [name] and [name] [name], daughter [name] [name], and grandson, [name] [name].  She has know metastatic colon cancer.  She saw Dr. Ky Barban, her oncologist, yesterday. She has over the last several weeks had increasing problems with her bowels.  She has a decreasing appetite.  I am not sure how much of this she discussed with Dr. Burr Medico yesterday - but she vomiting last PM and came to the [facility] today.  She was originally diagnosed in 05/2013.  She had a CEA of 225, and mets to the liver and spleen.  She had a flex sigmoidscopy by Dr. Rande [name] that showed an obstructing rectosigmoid mass 10 cm from the anal verge and  stented with a 9 cm stent (05/25/2013).  She was treated with FOLFOX first, then switched to FOLFIRI for 16 cycles.  The stent became dislodge and was retrieved by Dr. Ardis Hughs on 11/15/2013.    Her last CEA was 5 - 07/25/2014. She has been seen multiple times by my partner, Dr. Brien Mates.  She was last seen 01/01/2014.    She had an abdominal CT scan in 07/01/2104.  Then she had another Abdominal CT scan today, 08/17/2014 - There is focal narrowing of the distal sigmoid colon. Upstream from this focal narrowing there is a large amount of stool throughout the entire colon which is dilated. This focal narrowing is likely causing colonic obstruction. There is associated wall thickening of the descending colon and rectum, potentially secondary to superimposed colitis.  There is a small amount of free fluid adjacent to the descending colon and within the pelvis.  No significant interval change in size of 5 mm left lower lobe pulmonary nodule.    Past Medical History  Diagnosis Date  . Anxiety   . Colon cancer     dx'd 2014      Past Surgical History  Procedure Laterality Date  . Abdominal hysterectomy  2005  . Cesarean section  1976  . Flexible sigmoidoscopy N/A 05/25/2013    Procedure: FLEXIBLE SIGMOIDOSCOPY;  Surgeon: [name] Banister,  MD;  Location[facility];  Service: Endoscopy;  Laterality: N/A;  needs floro  . Colonic stent placement N/A 05/25/2013    Procedure: COLONIC STENT PLACEMENT;  Surgeon: [name] Banister, MD;  Location[facility];  Service: Endoscopy;  Laterality: N/A;  . Portacath placement Right 06/25/2013    Procedure: ULTRA SOUND GUIDED INSERTION PORT-A-CATH;  Surgeon: [name] Hollingshead, MD;  Location: [place];  Service: General;  Laterality: Right;      Current Facility-Administered Medications  Medication Dose Route Frequency Provider Last Rate Last Dose  . 0.9 %  sodium chloride infusion  1,000 mL Intravenous Continuous [name] Rank, MD 125 mL/hr  at 08/17/14 1216 1,000 mL at 08/17/14 1216  . 0.9 %  sodium chloride infusion   Intravenous Continuous [name] Rizwan, MD      . enoxaparin (LOVENOX) injection 40 mg  40 mg Subcutaneous Q24H [name] Rizwan, MD      . morphine 2 MG/ML injection 2 mg  2 mg Intravenous Q4H PRN [name] Odea, MD      . ondansetron (ZOFRAN) tablet 4 mg  4 mg Oral Q6H PRN [name] Odea, MD       Or  . ondansetron (ZOFRAN) injection 4 mg  4 mg Intravenous Q6H PRN [name] Odea, MD       Current Outpatient Prescriptions  Medication Sig Dispense Refill  . capecitabine (XELODA) 500 MG tablet Take 3 tablets (1,500 mg total) by mouth 2 (two) times daily after a meal. 84 tablet 2  . HYDROcodone-acetaminophen (NORCO) 5-325 MG per tablet Take 1-2 tablets by mouth every 6 (six) hours as needed. (Patient taking differently: Take 1-2 tablets by mouth every 6 (six) hours as needed for moderate pain. ) 10 tablet 0  . lidocaine-prilocaine (EMLA) cream Apply 1 application topically as needed. Apply to port site one hour before treatment and cover with plastic wrap. 30 g 3  . LORazepam (ATIVAN) 0.5 MG tablet Take 1 tablet (0.5 mg total) by mouth every 8 (eight) hours as needed (nausea/vomiting). 60 tablet 0  . nystatin (MYCOSTATIN) 100000 UNIT/ML suspension Take 5 mLs (500,000 Units total) by mouth 4 (four) times daily. 300 mL 0  . ondansetron (ZOFRAN) 8 MG tablet Take 8mg  by mouth every 12 hours as needed for nausea/vomiting.  Take 8 mg by mouth  30 minutes before taking chemo pills, and as needed for nausea/vomiting. 30 tablet 1  . OVER THE COUNTER MEDICATION Take 2 tablets by mouth daily as needed (constipation). Cleanse more.    . polyethylene glycol powder (GLYCOLAX/MIRALAX) powder TAKE 34 G (2 DOSES) BY MOUTH DAILY AS NEEDED FOR MILD CONSTIPATION OR MODERATE CONSTIPATION 255 g 0  . Probiotic Product (PROBIOTIC PO) Take 1 tablet by mouth once.    . prochlorperazine (COMPAZINE) 10 MG tablet TAKE 1 TABLET (10 MG TOTAL) BY MOUTH EVERY 6  (SIX) HOURS AS NEEDED (NAUSEA OR VOMITING). 30 tablet 1  . loperamide (IMODIUM A-D) 2 MG tablet Take 2 at onset of diarrhea, then 1 every 2hrs until 12hr without a BM. May take 2 tab every 4hrs at bedtime. If diarrhea recurs repeat. (Patient not taking: Reported on 08/16/2014) 100 tablet 1      Allergies  Allergen Reactions  . Codeine Itching and Nausea And Vomiting    REVIEW OF SYSTEMS: Skin:  No history of rash.  No history of abnormal moles. Infection:  No history of hepatitis or HIV.  No history of MRSA. Neurologic:  No history of stroke.  No history  of seizure.  No history of headaches. Cardiac:  No history of hypertension. No history of heart disease.  No history of prior cardiac catheterization.  No history of seeing a cardiologist. Pulmonary:  Does not smoke cigarettes.  No asthma or bronchitis.  No OSA/CPAP.  Endocrine:  No diabetes. No thyroid disease. Gastrointestinal:  See HPI. GYN:  She had a hysterectomy in 2005 Urologic:  No history of kidney stones.  No history of bladder infections. Musculoskeletal:  No history of joint or back disease. Hematologic:  No bleeding disorder.  No history of anemia.  Not anticoagulated. Psycho-social:  The patient is oriented.   Anxiety.  SOCIAL and FAMILY HISTORY: Married.  Husband, [name] [name]. On disability since cancer diagnosis in 2014.  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 She has two daughters and one son.  In the room was one daughter [name] [name], and grandson, [name] [name]. In the room are friends - [name] [name] and [name] [name],   PHYSICAL EXAM: BP 127/49 mmHg  Pulse 98  Temp(Src) 98 F (36.7 C) (Oral)  Resp 17  SpO2 99%  General: WN AA F who is alert. HEENT: Normal. Pupils equal. Neck: Supple. No mass.  No thyroid mass. Lymph Nodes:  No supraclavicular or cervical nodes. Lungs: Clear to auscultation and symmetric breath sounds. Heart:  RRR. No murmur or rub.  Abdomen: Soft. No mass.  No hernia. Normal bowel sounds.   Midline scar.  She is mildly distended. Rectal: Not done. Extremities:  Good strength and ROM  in upper and lower extremities. Neurologic:  Grossly intact to motor and sensory function. Psychiatric: Has normal mood and affect. Behavior is normal.   DATA REVIEWED: Epic notes.  Alphonsa Overall, MD,  [facility], [address]  [place], [place]    [place] Phone:  [phone] FAX:  [phone]

## 2014-08-17 NOTE — ED Notes (Signed)
Attempted to call report to 5W. RN unable to take report at this time. Will call back.

## 2014-08-17 NOTE — ED Notes (Signed)
Gave pt a urine sample cup. RN at bedside.

## 2014-08-17 NOTE — ED Notes (Signed)
Pt reports R side abd cramping, emesis and constipation since last night. Pt reports 2 episodes emesis in past 24 hours. LBM yesterday.

## 2014-08-17 NOTE — ED Notes (Signed)
Patient transported to CT 

## 2014-08-17 NOTE — ED Provider Notes (Addendum)
CSN: [id number]     Arrival date & time 08/17/14  1127 History   First MD Initiated Contact with Patient 08/17/14 1142     Chief Complaint  Patient presents with  . Abdominal Pain  . Emesis   Patient is a 64 y.o. female presenting with abdominal pain and vomiting. The history is provided by the patient.  Abdominal Pain Pain location:  Generalized Pain quality: cramping   Pain radiates to:  Does not radiate Pain severity:  Severe Onset quality:  Gradual Duration:  5 days Timing:  Intermittent Progression:  Worsening Associated symptoms: constipation (had a small bm yesterday, but not a normal one), nausea and vomiting   Associated symptoms: no diarrhea, no dysuria and no fever   Emesis Associated symptoms: abdominal pain and headaches (slight)   Associated symptoms: no diarrhea   Pt called the cancer center.  She was told to come to the ED.  Past Medical History  Diagnosis Date  . Anxiety   . Colon cancer     dx'd 2014   Past Surgical History  Procedure Laterality Date  . Abdominal hysterectomy  2005  . Cesarean section  1976  . Flexible sigmoidoscopy N/A 05/25/2013    Procedure: FLEXIBLE SIGMOIDOSCOPY;  Surgeon: [name], MD;  [facility];  Service: Endoscopy;  Laterality: N/A;  needs floro  . Colonic stent placement N/A 05/25/2013    Procedure: COLONIC STENT PLACEMENT;  Surgeon: [name], MD;  [facility];  Service: Endoscopy;  Laterality: N/A;  . Portacath placement Right 06/25/2013    Procedure: ULTRA SOUND GUIDED INSERTION PORT-A-CATH;  Surgeon: [name], MD;  Location: [place];  Service: General;  Laterality: Right;   Family History  Problem Relation Age of Onset  . Colon cancer Sister 66  . Diabetes Neg Hx   . Thyroid disease Neg Hx   . Breast cancer Maternal Aunt   . Prostate cancer Maternal Uncle   . Bone cancer Maternal Grandfather   . Prostate cancer Maternal Uncle    History  Substance Use  Topics  . Smoking status: Former Smoker -- 1 years    Types: Cigarettes    Quit date: 05/24/1971  . Smokeless tobacco: Never Used  . Alcohol Use: No   OB History    No data available     Review of Systems  Constitutional: Negative for fever.  Gastrointestinal: Positive for nausea, vomiting, abdominal pain and constipation (had a small bm yesterday, but not a normal one). Negative for diarrhea.  Genitourinary: Negative for dysuria.  Neurological: Positive for headaches (slight).      Allergies  Codeine  Home Medications   Prior to Admission medications   Medication Sig Start Date End Date Taking? Authorizing Provider  capecitabine (XELODA) 500 MG tablet Take 3 tablets (1,500 mg total) by mouth 2 (two) times daily after a meal. 08/16/14  Yes Truitt Merle, MD  HYDROcodone-acetaminophen (NORCO) 5-325 MG per tablet Take 1-2 tablets by mouth every 6 (six) hours as needed. Patient taking differently: Take 1-2 tablets by mouth every 6 (six) hours as needed for moderate pain.  08/05/14  Yes Veryl Speak, MD  lidocaine-prilocaine (EMLA) cream Apply 1 application topically as needed. Apply to port site one hour before treatment and cover with plastic wrap. 07/02/13  Yes Concha Norway, MD  LORazepam (ATIVAN) 0.5 MG tablet Take 1 tablet (0.5 mg total) by mouth every 8 (eight) hours as needed (nausea/vomiting). 04/22/14  Yes Aasim Marla Roe, MD  nystatin (MYCOSTATIN) 100000 UNIT/ML suspension Take 5 mLs (500,000 Units total) by mouth 4 (four) times daily. 08/16/14  Yes Truitt Merle, MD  ondansetron (ZOFRAN) 8 MG tablet Take 8mg  by mouth every 12 hours as needed for nausea/vomiting.  Take 8 mg by mouth  30 minutes before taking chemo pills, and as needed for nausea/vomiting. 07/15/14  Yes Truitt Merle, MD  OVER THE COUNTER MEDICATION Take 2 tablets by mouth daily as needed (constipation). Cleanse more.   Yes Historical Provider, MD  polyethylene glycol powder (GLYCOLAX/MIRALAX) powder TAKE 34 G (2 DOSES) BY  MOUTH DAILY AS NEEDED FOR MILD CONSTIPATION OR MODERATE CONSTIPATION 03/03/14  Yes Concha Norway, MD  Probiotic Product (PROBIOTIC PO) Take 1 tablet by mouth once.   Yes Historical Provider, MD  prochlorperazine (COMPAZINE) 10 MG tablet TAKE 1 TABLET (10 MG TOTAL) BY MOUTH EVERY 6 (SIX) HOURS AS NEEDED (NAUSEA OR VOMITING). 01/01/14  Yes Concha Norway, MD  loperamide (IMODIUM A-D) 2 MG tablet Take 2 at onset of diarrhea, then 1 every 2hrs until 12hr without a BM. May take 2 tab every 4hrs at bedtime. If diarrhea recurs repeat. Patient not taking: Reported on 08/16/2014 09/13/13   Concha Norway, MD   BP 137/79 mmHg  Pulse 87  Temp(Src) 98 F (36.7 C) (Oral)  Resp 18  SpO2 100% Physical Exam  ED Course  Procedures (including critical care time) Labs Review Labs Reviewed  COMPREHENSIVE METABOLIC PANEL - Abnormal; Notable for the following:    Glucose, Bld 106 (*)    Calcium 11.4 (*)    All other components within normal limits  CBC WITH DIFFERENTIAL/PLATELET  LIPASE, BLOOD  URINALYSIS, ROUTINE W REFLEX MICROSCOPIC    Imaging Review Ct Abdomen Pelvis W Contrast  08/17/2014   CLINICAL DATA:  Right-sided abdominal cramping, emesis and constipation.  EXAM: CT ABDOMEN AND PELVIS WITH CONTRAST  TECHNIQUE: Multidetector CT imaging of the abdomen and pelvis was performed using the standard protocol following bolus administration of intravenous contrast.  CONTRAST:  179mL OMNIPAQUE IOHEXOL 300 MG/ML  SOLN  COMPARISON:  CT 07/01/2014  FINDINGS: Lower chest: Visualization of the lower thorax demonstrates a 5 mm left lower lobe pulmonary nodule (image 7; series 4), stable from prior examination. Dependent atelectasis no pleural effusion. Normal heart size.  Hepatobiliary: Liver is normal in size and contour. Unchanged large hemangioma within the right hepatic lobe. Unchanged partially calcified lesion within the left hepatic lobe. Scattered liver cysts are not significantly changed. Gallbladder is unremarkable.   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  Adrenals/Urinary Tract: Normal bilateral adrenal glands. Kidneys enhance symmetrically with contrast. Unchanged nonobstructing stone inferior pole left kidney. No hydronephrosis.  Stomach/Bowel: Oral contrast material is demonstrated within the stomach and small bowel. No small bowel dilatation. There is a large amount of stool throughout the colon which is dilated. Small amount of fluid adjacent to the descending colon and within the pelvis. There is persistent asymmetric wall thickening involving the sigmoid colon. Focal narrowing of the distal sigmoid colon (image 78; series 2). Additionally there is suggestion of wall thickening of the descending colon and rectum.  Vascular/Lymphatic: Normal caliber abdominal aorta. No retroperitoneal lymphadenopathy.  Other: Small amount of fluid adjacent to the descending colon and within the pelvis.  Musculoskeletal: No aggressive or acute appearing osseous lesions.  IMPRESSION: There is focal narrowing of the distal sigmoid colon. Upstream from this focal narrowing there is a large amount of stool throughout the entire colon which is dilated. This focal  narrowing is likely causing colonic obstruction. There is associated wall thickening of the descending colon and rectum, potentially secondary to superimposed colitis.  There is a small amount of free fluid adjacent to the descending colon and within the pelvis.  No significant interval change in size of 5 mm left lower lobe pulmonary nodule.   Electronically Signed   By: Lovey Newcomer M.D.   On: 08/17/2014 14:55    Medications  0.9 %  sodium chloride infusion (1,000 mLs Intravenous New Bag/Given 08/17/14 1217)    Followed by  0.9 %  sodium chloride infusion (1,000 mLs Intravenous New Bag/Given 08/17/14 1216)  HYDROmorphone (DILAUDID) injection 0.5 mg (0.5 mg Intravenous Given 08/17/14 1314)  ondansetron (ZOFRAN) injection 4 mg (4 mg Intravenous Given  08/17/14 1216)  iohexol (OMNIPAQUE) 300 MG/ML solution 50 mL (50 mLs Oral Contrast Given 08/17/14 1241)  iohexol (OMNIPAQUE) 300 MG/ML solution 100 mL (100 mLs Intravenous Contrast Given 08/17/14 1401)     MDM   Final diagnoses:  Colonic obstruction    The patient's CT scan shows an area of narrowing in the distal sigmoid colon. Patient previously had a CT scan in December that showed some inflammation of the sigmoid colon. There was a potential concern for tumor recurrence. The patient's oncologist's indicate that surgical resection was an option however the patient was not interested in any surgery.  I discussed the findings today with the patient. I explained to her that surgery may be a consideration for her obstruction. Pt would consider surgery if it would help with her pain.  I will consult with general surgery to discuss further treatment.  Discussed with Dr Lucia Gaskins.  She does have metastatic disease.  As of yesterday she had said she did not want surgery.  Recommends medical admission.  He will consult on the patient.     Dorie Rank, MD 08/17/14 1532  Discussed with Dr. Wynelle Cleveland.  She will speak with Dr Lucia Gaskins regarding whether pt will be admitted to surgical service vs medical service.   Dorie Rank, MD 08/17/14 [phone]

## 2014-08-18 ENCOUNTER — Encounter (HOSPITAL_COMMUNITY): Payer: Self-pay | Admitting: *Deleted

## 2014-08-18 DIAGNOSIS — K5649 Other impaction of intestine: Secondary | ICD-10-CM

## 2014-08-18 DIAGNOSIS — K5660 Unspecified intestinal obstruction: Principal | ICD-10-CM

## 2014-08-18 DIAGNOSIS — C19 Malignant neoplasm of rectosigmoid junction: Secondary | ICD-10-CM

## 2014-08-18 DIAGNOSIS — R198 Other specified symptoms and signs involving the digestive system and abdomen: Secondary | ICD-10-CM

## 2014-08-18 LAB — BASIC METABOLIC PANEL
ANION GAP: 7 (ref 5–15)
BUN: 8 mg/dL (ref 6–23)
CALCIUM: 9.7 mg/dL (ref 8.4–10.5)
CHLORIDE: 110 mmol/L (ref 96–112)
CO2: 20 mmol/L (ref 19–32)
CREATININE: 0.55 mg/dL (ref 0.50–1.10)
GFR calc non Af Amer: >90 mL/min (ref >90)
Glucose, Bld: 110 mg/dL — ABNORMAL HIGH (ref 70–99)
POTASSIUM: 2.9 mmol/L — AB (ref 3.5–5.1)
SODIUM: 137 mmol/L (ref 135–145)

## 2014-08-18 LAB — CEA: CEA: 4.1 ng/mL (ref 0.0–5.0)

## 2014-08-18 LAB — CBC
HEMATOCRIT: 33.1 % — AB (ref 36.0–46.0)
HEMOGLOBIN: 10.5 g/dL — AB (ref 12.0–15.0)
MCH: 27.4 pg (ref 26.0–34.0)
MCHC: 31.7 g/dL (ref 30.0–36.0)
MCV: 86.4 fL (ref 78.0–100.0)
PLATELETS: 317 10*3/uL (ref 150–400)
RBC: 3.83 MIL/uL — ABNORMAL LOW (ref 3.87–5.11)
RDW: 14.2 % (ref 11.5–15.5)
WBC: 6.8 10*3/uL (ref 4.0–10.5)

## 2014-08-18 MED ORDER — KCL IN DEXTROSE-NACL 20-5-0.45 MEQ/L-%-% IV SOLN
INTRAVENOUS | Status: DC
  Administered 2014-08-18 – 2014-08-19 (×4): via INTRAVENOUS
  Filled 2014-08-18 (×7): qty 1000

## 2014-08-18 MED ORDER — MORPHINE SULFATE 4 MG/ML IJ SOLN
INTRAMUSCULAR | Status: AC
  Administered 2014-08-18: 01:00:00
  Filled 2014-08-18: qty 1

## 2014-08-18 MED ORDER — MORPHINE SULFATE 4 MG/ML IJ SOLN
4.0000 mg | INTRAMUSCULAR | Status: DC | PRN
  Administered 2014-08-18 – 2014-08-19 (×7): 4 mg via INTRAVENOUS
  Filled 2014-08-18 (×7): qty 1

## 2014-08-18 MED ORDER — MORPHINE SULFATE 4 MG/ML IJ SOLN
4.0000 mg | INTRAMUSCULAR | Status: DC | PRN
  Administered 2014-08-18 (×2): 4 mg via INTRAVENOUS
  Filled 2014-08-18: qty 1

## 2014-08-18 MED ORDER — POLYETHYLENE GLYCOL 3350 17 G PO PACK
17.0000 g | PACK | Freq: Three times a day (TID) | ORAL | Status: DC
  Administered 2014-08-18 (×3): 17 g via ORAL
  Filled 2014-08-18 (×9): qty 1

## 2014-08-18 NOTE — Progress Notes (Signed)
[facility] Progress Note   [name] [name] YPP:[id number] DOB: 15-Mar-1951 DOA: 08/17/2014 PCP: [facility]  Brief narrative: [name] [name] is a 64 y.o. female with stage 4 colon cancer being treated with chemo presents with vomiting starting last night. She is also having cramping of her abdomen. She has a known sigmoid narrowing and has declined surgery in the past but has been continuing chemotherapy.CT scan of the abdomen shows continued narrowing of the sigmoid colon with a significant amount of backup of stool throughout the colon.   Subjective: abd pain controlled with Morphine -no vomiting  Assessment/Plan: Principal Problem:   Bowel obstruction due to sigmoid narrowing - discussed plan with Dr Olevia Perches- plan is to try to place stent tomorrow - give enemas today- patient in agreement  - cont NPO, IVF, IV Morphine PRN  Active Problems:    Colorectal cancer, stage IV - oncology Dr Burr Medico managing as outpt   Code Status: full code Family Communication:  Disposition Plan: home when stable DVT prophylaxis: SCDs  Consultants: GI  Procedures:   Antibiotics: Anti-infectives    None         Objective: Filed Weights   08/18/14 0620  Weight: 69.4 kg (153 lb)    Intake/Output Summary (Last 24 hours) at 08/18/14 1316 Last data filed at 08/18/14 0700  Gross per 24 hour  Intake 1437.5 ml  Output    701 ml  Net  736.5 ml     Vitals Filed Vitals:   08/17/14 1800 08/17/14 2207 08/18/14 0620 08/18/14 0639  BP: 129/45 140/69  153/78  Pulse: 83 84  78  Temp: 98 F (36.7 C) 98.8 F (37.1 C)  98.2 F (36.8 C)  TempSrc: Oral Oral  Oral  Resp: 18 18  18   Height:   5\' 5"  (1.651 m)   Weight:   69.4 kg (153 lb)   SpO2: 98% 98%  98%    Exam: General: AAO x3, No acute respiratory distress Lungs: Clear to auscultation bilaterally without wheezes or crackles Cardiovascular: Regular rate and rhythm without murmur gallop or rub normal S1 and S2 Abdomen:  mild diffuse tenderness, Distended, soft, bowel sounds decreased, no rebound, no ascites, no appreciable mass Extremities: No significant cyanosis, clubbing, or edema bilateral lower extremities  Data Reviewed: Basic Metabolic Panel:  Recent Labs Lab 08/16/14 1121 08/17/14 1201 08/18/14 0550  NA 138 140 137  K 4.0 3.7 2.9*  CL  --  109 110  CO2 24 21 20   GLUCOSE 104 106* 110*  BUN 10.8 12 8   CREATININE 0.8 0.67 0.55  CALCIUM 11.0* 11.4* 9.7   Liver Function Tests:  Recent Labs Lab 08/16/14 1121 08/17/14 1201  AST 14 20  ALT 7 11  ALKPHOS 60 59  BILITOT 0.26 0.6  PROT 7.9 8.3  ALBUMIN 3.5 4.0    Recent Labs Lab 08/17/14 1201  LIPASE 33   No results for input(s): AMMONIA in the last 168 hours. CBC:  Recent Labs Lab 08/16/14 1121 08/17/14 1201 08/18/14 0550  WBC 6.6 6.9 6.8  NEUTROABS 4.3 4.8  --   HGB 10.9* 12.2 10.5*  HCT 34.6* 36.9 33.1*  MCV 85.9 86.0 86.4  PLT 325 326 317   Cardiac Enzymes: No results for input(s): CKTOTAL, CKMB, CKMBINDEX, TROPONINI in the last 168 hours. BNP (last 3 results) No results for input(s): BNP in the last 8760 hours.  ProBNP (last 3 results) No results for input(s): PROBNP in the last 8760 hours.  CBG: No  results for input(s): GLUCAP in the last 168 hours.  No results found for this or any previous visit (from the past 240 hour(s)).   Studies:  Recent x-ray studies have been reviewed in detail by the Attending Physician  Scheduled Meds:  Scheduled Meds: . polyethylene glycol  17 g Oral TID   Continuous Infusions: . sodium chloride 1,000 mL (08/17/14 1216)  . dextrose 5 % and 0.45 % NaCl with KCl 20 mEq/L 125 mL/hr at 08/18/14 1109    Time spent on care of this patient: 34min   Kregg Cihlar, MD 08/18/2014, 1:16 PM  LOS: 1 day   [facility] Office  [phone] Pager - Text Page per [email or web address]  If 7PM-7AM, please contact night-coverage [email or web address]

## 2014-08-18 NOTE — Consult Note (Signed)
[facility] Consult: 8:37 AM 08/18/2014  LOS: 1 day    Referring Provider: Dr Wynelle Cleveland  Primary Care Physician:  [facility] Primary Gastroenterologist:  Dr. Ardis Hughs Onologist: Dr Burr Medico.     Reason for Consultation:  Obstructing rectal mass.    HPI: [name] [name] is a 64 y.o. female.   PMH  Anxiety.  Diagnosed with stage 4 colon cancer in fall 2014. Stent placed 05/2013 to relieve obstructing rectal mass.  As tumor regressed, the stent dislodged, protruding into anus and was manually retrieved by Dr Ardis Hughs in 11/2013.  Mets to liver, spleen and CEA level have regressed with chemo of FOLFOX and FOLFIRI.    08/05/14 visit to ED with abdominal pain, constipation which began after change in chemo agent to Xeloda.  Abdominal films with moderate stool burden and a few AF levels with prominent gas but no obstruction. She was discharged home and managed to have occasional, smaller than her normal warm, non-bloody bowel movements. Her belly has become distended and tense. She is not having significant abdominal pain. Last evening she developed vomiting. It had been a couple of days since her last bowel movement. She came to [facility] EGD. CT abdomen shows some focal narrowing at the distal sigmoid area upstream from this region is a large amount of stool in a dilated colon. Also noted is some wall thickening in the descending colon and rectum which radiology feels could potentially imply superimposed colitis. Currently Dr Lucia Gaskins does not feel she is candidate for primary anastomosis, pt prefers another stent to diverting colostomy. She just received the first of scheduled tap water enemas and has managed to have a small bowel movement, she feels more comfortable.  Up until the past few weeks, the patient had been eating  well, her stomach was flat. Her weight had been stable. Energy level was good.     Past Medical History  Diagnosis Date  . Anxiety   . Colon cancer     dx'd 2014    Past Surgical History  Procedure Laterality Date  . Abdominal hysterectomy  2005  . Cesarean section  1976  . Flexible sigmoidoscopy N/A 05/25/2013    Procedure: FLEXIBLE SIGMOIDOSCOPY;  Surgeon: [name], MD;  Location: [facility];  Service: Endoscopy;  Laterality: N/A;  needs floro  . Colonic stent placement N/A 05/25/2013    Procedure: COLONIC STENT PLACEMENT;  Surgeon: [name], MD;  Location: [facility];  Service: Endoscopy;  Laterality: N/A;  . Portacath placement Right 06/25/2013    Procedure: ULTRA SOUND GUIDED INSERTION PORT-A-CATH;  Surgeon: [name], MD;  Location: [place];  Service: General;  Laterality: Right;    Prior to Admission medications   Medication Sig Start Date End Date Taking? Authorizing Provider  capecitabine (XELODA) 500 MG tablet Take 3 tablets (1,500 mg total) by mouth 2 (two) times daily after a meal. 08/16/14  Yes Truitt Merle, MD  HYDROcodone-acetaminophen (NORCO) 5-325 MG per tablet Take 1-2 tablets by mouth every 6 (six) hours as needed. Patient taking  differently: Take 1-2 tablets by mouth every 6 (six) hours as needed for moderate pain.  08/05/14  Yes Veryl Speak, MD  lidocaine-prilocaine (EMLA) cream Apply 1 application topically as needed. Apply to port site one hour before treatment and cover with plastic wrap. 07/02/13  Yes Concha Norway, MD  LORazepam (ATIVAN) 0.5 MG tablet Take 1 tablet (0.5 mg total) by mouth every 8 (eight) hours as needed (nausea/vomiting). 04/22/14  Yes Aasim Marla Roe, MD  nystatin (MYCOSTATIN) 100000 UNIT/ML suspension Take 5 mLs (500,000 Units total) by mouth 4 (four) times daily. 08/16/14  Yes Truitt Merle, MD  ondansetron (ZOFRAN) 8 MG tablet Take 8mg  by mouth every 12 hours as needed for nausea/vomiting.  Take 8 mg by  mouth  30 minutes before taking chemo pills, and as needed for nausea/vomiting. 07/15/14  Yes Truitt Merle, MD  OVER THE COUNTER MEDICATION Take 2 tablets by mouth daily as needed (constipation). Cleanse more.   Yes Historical Provider, MD  polyethylene glycol powder (GLYCOLAX/MIRALAX) powder TAKE 34 G (2 DOSES) BY MOUTH DAILY AS NEEDED FOR MILD CONSTIPATION OR MODERATE CONSTIPATION 03/03/14  Yes Concha Norway, MD  Probiotic Product (PROBIOTIC PO) Take 1 tablet by mouth once.   Yes Historical Provider, MD  prochlorperazine (COMPAZINE) 10 MG tablet TAKE 1 TABLET (10 MG TOTAL) BY MOUTH EVERY 6 (SIX) HOURS AS NEEDED (NAUSEA OR VOMITING). 01/01/14  Yes Concha Norway, MD  loperamide (IMODIUM A-D) 2 MG tablet Take 2 at onset of diarrhea, then 1 every 2hrs until 12hr without a BM. May take 2 tab every 4hrs at bedtime. If diarrhea recurs repeat. Patient not taking: Reported on 08/16/2014 09/13/13   Concha Norway, MD    Scheduled Meds: . dicyclomine  20 mg Intramuscular QID  . enoxaparin (LOVENOX) injection  40 mg Subcutaneous Q24H  . famotidine (PEPCID) IV  20 mg Intravenous Q12H  . pantoprazole (PROTONIX) IV  40 mg Intravenous Q12H   Infusions: . sodium chloride 1,000 mL (08/17/14 1216)  . sodium chloride 125 mL/hr at 08/18/14 0350   PRN Meds: morphine injection, ondansetron **OR** ondansetron (ZOFRAN) IV   Allergies as of 08/17/2014 - Review Complete 08/17/2014  Allergen Reaction Noted  . Codeine Itching and Nausea And Vomiting 05/22/2013    Family History  Problem Relation Age of Onset  . Colon cancer Sister 40  . Diabetes Neg Hx   . Thyroid disease Neg Hx   . Breast cancer Maternal Aunt   . Prostate cancer Maternal Uncle   . Bone cancer Maternal Grandfather   . Prostate cancer Maternal Uncle     History   Social History  . Marital Status: Married    Spouse Name: N/A  . Number of Children: 3  . Years of Education: N/A   Occupational History  .     Social History Main Topics  . Smoking  status: Former Smoker -- 1 years    Types: Cigarettes    Quit date: 05/24/1971  . Smokeless tobacco: Never Used  . Alcohol Use: No  . Drug Use: No  . Sexual Activity: Not on file   Other Topics Concern  . Not on file   Social History Narrative    REVIEW OF SYSTEMS: Constitutional:  No weakness, no significant weight loss. ENT:  No nose bleeds Pulm:  No shortness of breath, no cough, no pleuritic pain. CV:  No palpitations, no LE edema. No chest pain GU:  No hematuria, no frequency. GI:  Per HPI.  No heartburn, no dysphagia. Heme:  No unusual bleeding or bruising.   Transfusions:  none Neuro:  No headaches, no peripheral tingling or numbness Derm:  No itching, no rash or sores.  Endocrine:  No sweats or chills.  No polyuria or dysuria Immunization:  Not queried.  Travel:  None beyond local counties in last few months.    PHYSICAL EXAM: Vital signs in last 24 hours: Filed Vitals:   08/18/14 0639  BP: 153/78  Pulse: 78  Temp: 98.2 F (36.8 C)  Resp: 18   Wt Readings from Last 3 Encounters:  08/18/14 153 lb (69.4 kg)  08/16/14 153 lb (69.4 kg)  07/25/14 153 lb 4.8 oz (69.536 kg)   General: Pleasant, looks well. Comfortable. Head:  No facial asymmetry or swelling.  Eyes:  No icterus, no conjunctival pallor. Ears:  Not HOH.  Nose:  No congestion or discharge Mouth:  MM moist and clear. Dentition good. Neck:  No JVD, no TMG, no masses Lungs:  CTA bilaterally. No shortness of breath or cough Heart: RRR. No MRG. S1/S2 audible Abdomen:  Tense, distended/protuberant. Not tender. No hernias, no organomegaly, no bruits, no masses.   Rectal: Stool is brown. No palpable impaction or mass. Stool tests FOBT positive   Musc/Skeltl: No joint swelling, contractures or redness. Extremities:  No pedal or lower extremity edema.  Neurologic:  Oriented 3, alert. No limb weakness. No tremor Skin:  No rash, no sores Nodes:  No inguinal adenopathy. No cervical adenopathy.   Psych:   Pleasant, cooperative. Not agitated or depressed.  Intake/Output from previous day: 02/13 0701 - 02/14 0700 In: 1437.5 [I.V.:1387.5; IV Piggyback:50] Out: 551 [Urine:550; Emesis/NG output:1] Intake/Output this shift:    LAB RESULTS:  Recent Labs  08/16/14 1121 08/17/14 1201 08/18/14 0550  WBC 6.6 6.9 6.8  HGB 10.9* 12.2 10.5*  HCT 34.6* 36.9 33.1*  PLT 325 326 317  MCV                                          86  BMET Lab Results  Component Value Date   NA 137 08/18/2014   NA 140 08/17/2014   NA 138 08/16/2014   K 2.9* 08/18/2014   K 3.7 08/17/2014   K 4.0 08/16/2014   CL 110 08/18/2014   CL 109 08/17/2014   CL 110 08/05/2014   CO2 20 08/18/2014   CO2 21 08/17/2014   CO2 24 08/16/2014   GLUCOSE 110* 08/18/2014   GLUCOSE 106* 08/17/2014   GLUCOSE 104 08/16/2014   BUN 8 08/18/2014   BUN 12 08/17/2014   BUN 10.8 08/16/2014   CREATININE 0.55 08/18/2014   CREATININE 0.67 08/17/2014   CREATININE 0.8 08/16/2014   CALCIUM 9.7 08/18/2014   CALCIUM 11.4* 08/17/2014   CALCIUM 11.0* 08/16/2014   LFT  Recent Labs  08/16/14 1121 08/17/14 1201  PROT 7.9 8.3  ALBUMIN 3.5 4.0  AST 14 20  ALT 7 11  ALKPHOS 60 59  BILITOT 0.26 0.6   PT/INR Lab Results  Component Value Date   INR 0.97 06/12/2013   Lipase     Component Value Date/Time   LIPASE 33 08/17/2014 1201    RADIOLOGY STUDIES: Ct Abdomen Pelvis W Contrast 08/17/2014  COMPARISON:  CT 07/01/2014  FINDINGS: Lower chest: Visualization of the lower thorax demonstrates a 5 mm left lower lobe pulmonary nodule (image 7; series 4), stable from prior examination. Dependent atelectasis  no pleural effusion. Normal heart size.  Hepatobiliary: Liver is normal in size and contour. Unchanged large hemangioma within the right hepatic lobe. Unchanged partially calcified lesion within the left hepatic lobe. Scattered liver cysts are not significantly changed. Gallbladder is unremarkable.  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  Adrenals/Urinary Tract: Normal bilateral adrenal glands. Kidneys enhance symmetrically with contrast. Unchanged nonobstructing stone inferior pole left kidney. No hydronephrosis.  Stomach/Bowel: Oral contrast material is demonstrated within the stomach and small bowel. No small bowel dilatation. There is a large amount of stool throughout the colon which is dilated. Small amount of fluid adjacent to the descending colon and within the pelvis. There is persistent asymmetric wall thickening involving the sigmoid colon. Focal narrowing of the distal sigmoid colon (image 78; series 2). Additionally there is suggestion of wall thickening of the descending colon and rectum.  Vascular/Lymphatic: Normal caliber abdominal aorta. No retroperitoneal lymphadenopathy.  Other: Small amount of fluid adjacent to the descending colon and within the pelvis.  Musculoskeletal: No aggressive or acute appearing osseous lesions.  IMPRESSION: There is focal narrowing of the distal sigmoid colon. Upstream from this focal narrowing there is a large amount of stool throughout the entire colon which is dilated. This focal narrowing is likely causing colonic obstruction. There is associated wall thickening of the descending colon and rectum, potentially secondary to superimposed colitis.  There is a small amount of free fluid adjacent to the descending colon and within the pelvis.  No significant interval change in size of 5 mm left lower lobe pulmonary nodule.   Electronically Signed   By: Lovey Newcomer M.D.   On: 08/17/2014 14:55    ENDOSCOPIC STUDIES: 05/2013  Flex Sig with biopsy and stent placement.  ENDOSCOPIC IMPRESSION: 5-6cm obstructing rectosigmoid mass, distal end 10cm from anal verge. The mass was biopsied and then stented using a 9cm long, 70mm diameter uncovered SEMS.  IMPRESSION:   *   Obstructing sigmoid colon cancer.  Stent placed 05/2013, migrated out and removed 11/2013.  Now with  recurrent partial obstruction.    *  Stable pulmonary nodule.   *  Normocytic anemia.   *  Hypokalemia.     PLAN:     *  Flex sig with attempt to replace rectal stent tomorrow.   *  Stop lovenox in anticipation of stent placement.  Stop Protonix and Pepcid. , not having UGI issues. .  Stop Bentyl, it is constipating.  Tap water enemas ordered by Dr Olevia Perches.  Adding miralax though may not be able to tolerate.  *  I switched her IV fluids to D5 1/2 ns with 20 of K to address the hypokalemia. Check Bmet in the morning.   Azucena Freed  08/18/2014, 8:37 AM Pager: [phone]

## 2014-08-18 NOTE — Progress Notes (Signed)
Late entry: Pt refused NGT stating, "I want to wait til I see the surgeon."  No c/o nausea or vomiting at this time. Awaiting Dr. Lucia Gaskins to make consult rounds this evening.

## 2014-08-19 ENCOUNTER — Encounter (HOSPITAL_COMMUNITY)
Admission: EM | Disposition: A | Discharge status: Home / Self Care | Payer: Self-pay | Source: Home / Self Care | Attending: Internal Medicine

## 2014-08-19 ENCOUNTER — Inpatient Hospital Stay (HOSPITAL_COMMUNITY): Payer: Medicaid Other | Admitting: Anesthesiology

## 2014-08-19 ENCOUNTER — Inpatient Hospital Stay (HOSPITAL_COMMUNITY): Payer: Medicaid Other

## 2014-08-19 ENCOUNTER — Encounter (HOSPITAL_COMMUNITY): Payer: Self-pay | Admitting: Gastroenterology

## 2014-08-19 HISTORY — PX: FLEXIBLE SIGMOIDOSCOPY: SHX5431

## 2014-08-19 HISTORY — PX: COLONIC STENT PLACEMENT: SHX5542

## 2014-08-19 LAB — BASIC METABOLIC PANEL
Anion gap: 5 (ref 5–15)
BUN: 6 mg/dL (ref 6–23)
CO2: 23 mmol/L (ref 19–32)
CREATININE: 0.59 mg/dL (ref 0.50–1.10)
Calcium: 10 mg/dL (ref 8.4–10.5)
Chloride: 107 mmol/L (ref 96–112)
GFR calc Af Amer: >90 mL/min (ref >90)
GFR calc non Af Amer: >90 mL/min (ref >90)
Glucose, Bld: 136 mg/dL — ABNORMAL HIGH (ref 70–99)
POTASSIUM: 3 mmol/L — AB (ref 3.5–5.1)
SODIUM: 135 mmol/L (ref 135–145)

## 2014-08-19 SURGERY — SIGMOIDOSCOPY, FLEXIBLE
Anesthesia: Monitor Anesthesia Care

## 2014-08-19 SURGERY — SIGMOIDOSCOPY, FLEXIBLE
Anesthesia: Moderate Sedation

## 2014-08-19 MED ORDER — CETYLPYRIDINIUM CHLORIDE 0.05 % MT LIQD
7.0000 mL | Freq: Two times a day (BID) | OROMUCOSAL | Status: DC
  Administered 2014-08-20: 7 mL via OROMUCOSAL

## 2014-08-19 MED ORDER — LACTATED RINGERS IV SOLN
INTRAVENOUS | Status: DC | PRN
  Administered 2014-08-19: 11:00:00 via INTRAVENOUS

## 2014-08-19 MED ORDER — PROPOFOL 10 MG/ML IV BOLUS
INTRAVENOUS | Status: AC
  Filled 2014-08-19: qty 20

## 2014-08-19 MED ORDER — PROPOFOL 10 MG/ML IV BOLUS
INTRAVENOUS | Status: AC
  Filled 2014-08-19: qty 40

## 2014-08-19 MED ORDER — POTASSIUM CHLORIDE 10 MEQ/100ML IV SOLN
10.0000 meq | INTRAVENOUS | Status: AC
  Administered 2014-08-19 (×2): 10 meq via INTRAVENOUS
  Filled 2014-08-19 (×4): qty 100

## 2014-08-19 MED ORDER — IOHEXOL 300 MG/ML  SOLN
INTRAMUSCULAR | Status: DC | PRN
  Administered 2014-08-19: 12:00:00

## 2014-08-19 MED ORDER — LACTATED RINGERS IV SOLN: INTRAVENOUS | Status: DC

## 2014-08-19 MED ORDER — POTASSIUM CHLORIDE CRYS ER 20 MEQ PO TBCR
40.0000 meq | EXTENDED_RELEASE_TABLET | Freq: Once | ORAL | Status: AC
  Administered 2014-08-19: 40 meq via ORAL
  Filled 2014-08-19: qty 2

## 2014-08-19 MED ORDER — LACTATED RINGERS IV SOLN
INTRAVENOUS | Status: DC
  Administered 2014-08-19: 1000 mL via INTRAVENOUS

## 2014-08-19 MED ORDER — FENTANYL CITRATE 0.05 MG/ML IJ SOLN: 25.0000 ug | INTRAMUSCULAR | Status: DC | PRN

## 2014-08-19 MED ORDER — PROPOFOL 10 MG/ML IV BOLUS
INTRAVENOUS | Status: DC | PRN
  Administered 2014-08-19: 50 mg via INTRAVENOUS
  Administered 2014-08-19 (×5): 25 mg via INTRAVENOUS
  Administered 2014-08-19: 50 mg via INTRAVENOUS
  Administered 2014-08-19 (×3): 25 mg via INTRAVENOUS

## 2014-08-19 MED ORDER — CHLORHEXIDINE GLUCONATE 0.12 % MT SOLN
15.0000 mL | Freq: Two times a day (BID) | OROMUCOSAL | Status: DC
  Administered 2014-08-19 – 2014-08-20 (×3): 15 mL via OROMUCOSAL
  Filled 2014-08-19 (×4): qty 15

## 2014-08-19 MED ORDER — SODIUM CHLORIDE 0.9 % IV SOLN: INTRAVENOUS | Status: DC

## 2014-08-19 MED ORDER — POTASSIUM CHLORIDE 10 MEQ/100ML IV SOLN
10.0000 meq | INTRAVENOUS | Status: DC
  Filled 2014-08-19 (×4): qty 100

## 2014-08-19 NOTE — Progress Notes (Signed)
    The patient has a rectosigmoid malignant stricture and desires stenting (again). She does not want to have surgical intervention.The risks and benefits as well as alternatives of endoscopic procedure(s) have been discussed and reviewed. She understands there could be perforation or other problems that would require surgery to repair, if possible. All questions answered. The patient agrees to proceed.   On exam she has clear lungs and NL S1S2. 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  Will perform colonic stent placement.

## 2014-08-19 NOTE — Anesthesia Postprocedure Evaluation (Signed)
  Anesthesia Post-op Note  Patient: [name] [name]  Procedure(s) Performed: Procedure(s) (LRB): FLEXIBLE SIGMOIDOSCOPY (N/A) COLONIC STENT PLACEMENT (N/A)  Patient Location: PACU  Anesthesia Type: MAC  Level of Consciousness: awake and alert   Airway and Oxygen Therapy: Patient Spontanous Breathing  Post-op Pain: mild  Post-op Assessment: Post-op Vital signs reviewed, Patient's Cardiovascular Status Stable, Respiratory Function Stable, Patent Airway and No signs of Nausea or vomiting  Last Vitals:  Filed Vitals:   08/19/14 1240  BP: 158/78  Pulse: 86  Temp:   Resp: 19    Post-op Vital Signs: stable   Complications: No apparent anesthesia complications

## 2014-08-19 NOTE — Progress Notes (Signed)
Administered soap suds enema. Pt had several small stools. Will continue to monitor.

## 2014-08-19 NOTE — Progress Notes (Signed)
Pt presents with nausea. Zofran administered. Miralax held d/t nausea. Pt feels she cannot drink it at this time. Will continue to monitor.

## 2014-08-19 NOTE — Transfer of Care (Signed)
Immediate Anesthesia Transfer of Care Note  Patient: [name] [name]  Procedure(s) Performed: Procedure(s): FLEXIBLE SIGMOIDOSCOPY (N/A) COLONIC STENT PLACEMENT (N/A)  Patient Location: PACU  Anesthesia Type:MAC  Level of Consciousness: awake, sedated and patient cooperative  Airway & Oxygen Therapy: Patient Spontanous Breathing and Patient connected to face mask oxygen  Post-op Assessment: Report given to RN and Post -op Vital signs reviewed and stable  Post vital signs: Reviewed and stable  Last Vitals:  Filed Vitals:   08/19/14 1053  BP: 152/81  Pulse: 92  Temp: 36.4 C  Resp: 24    Complications: No apparent anesthesia complications

## 2014-08-19 NOTE — Progress Notes (Signed)
[facility] Progress Note  Day of Surgery  Subjective: Pt says she feels much better, has had multiple loose BM's.  Pending stent placement.  No N/V currently, NPO.  Still does not want surgical interventions and has no questions/concerns for [place].    Objective: Vital signs in last 24 hours: Temp:  [98.4 F (36.9 C)-99.2 F (37.3 C)] 98.4 F (36.9 C) (02/15 0620) Pulse Rate:  [82-86] 84 (02/15 0620) Resp:  [18] 18 (02/15 0620) BP: (130-136)/(55-66) 133/66 mmHg (02/15 0620) SpO2:  [99 %-100 %] 99 % (02/15 0620) Last BM Date: 08/19/14  Intake/Output from previous day: 02/14 0701 - 02/15 0700 In: 5585.4 [I.V.:5585.4] Out: 750 [Urine:750] Intake/Output this shift:    PE: Gen:  Alert, NAD, pleasant Abd: Soft, distended, not very tender, +BS    Lab Results:   Recent Labs  08/17/14 1201 08/18/14 0550  WBC 6.9 6.8  HGB 12.2 10.5*  HCT 36.9 33.1*  PLT 326 317   BMET  Recent Labs  08/18/14 0550 08/19/14 0546  NA 137 135  K 2.9* 3.0*  CL 110 107  CO2 20 23  GLUCOSE 110* 136*  BUN 8 6  CREATININE 0.55 0.59  CALCIUM 9.7 10.0   PT/INR No results for input(s): LABPROT, INR in the last 72 hours. CMP     Component Value Date/Time   NA 135 08/19/2014 0546   NA 138 08/16/2014 1121   K 3.0* 08/19/2014 0546   K 4.0 08/16/2014 1121   CL 107 08/19/2014 0546   CO2 23 08/19/2014 0546   CO2 24 08/16/2014 1121   GLUCOSE 136* 08/19/2014 0546   GLUCOSE 104 08/16/2014 1121   BUN 6 08/19/2014 0546   BUN 10.8 08/16/2014 1121   CREATININE 0.59 08/19/2014 0546   CREATININE 0.8 08/16/2014 1121   CALCIUM 10.0 08/19/2014 0546   CALCIUM 11.0* 08/16/2014 1121   PROT 8.3 08/17/2014 1201   PROT 7.9 08/16/2014 1121   ALBUMIN 4.0 08/17/2014 1201   ALBUMIN 3.5 08/16/2014 1121   AST 20 08/17/2014 1201   AST 14 08/16/2014 1121   ALT 11 08/17/2014 1201   ALT 7 08/16/2014 1121   ALKPHOS 59 08/17/2014 1201   ALKPHOS 60 08/16/2014 1121   BILITOT 0.6 08/17/2014 1201    BILITOT 0.26 08/16/2014 1121   GFRNONAA >90 08/19/2014 0546   GFRAA >90 08/19/2014 0546   Lipase     Component Value Date/Time   LIPASE 33 08/17/2014 1201       Studies/Results: Ct Abdomen Pelvis W Contrast  08/17/2014   CLINICAL DATA:  Right-sided abdominal cramping, emesis and constipation.  EXAM: CT ABDOMEN AND PELVIS WITH CONTRAST  TECHNIQUE: Multidetector CT imaging of the abdomen and pelvis was performed using the standard protocol following bolus administration of intravenous contrast.  CONTRAST:  136mL OMNIPAQUE IOHEXOL 300 MG/ML  SOLN  COMPARISON:  CT 07/01/2014  FINDINGS: Lower chest: Visualization of the lower thorax demonstrates a 5 mm left lower lobe pulmonary nodule (image 7; series 4), stable from prior examination. Dependent atelectasis no pleural effusion. Normal heart size.  Hepatobiliary: Liver is normal in size and contour. Unchanged large hemangioma within the right hepatic lobe. Unchanged partially calcified lesion within the left hepatic lobe. Scattered liver cysts are not significantly changed. Gallbladder is unremarkable.  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  Adrenals/Urinary Tract: Normal bilateral adrenal glands. Kidneys enhance symmetrically with contrast. Unchanged nonobstructing stone inferior pole left kidney. No hydronephrosis.  Stomach/Bowel: Oral contrast material is demonstrated  within the stomach and small bowel. No small bowel dilatation. There is a large amount of stool throughout the colon which is dilated. Small amount of fluid adjacent to the descending colon and within the pelvis. There is persistent asymmetric wall thickening involving the sigmoid colon. Focal narrowing of the distal sigmoid colon (image 78; series 2). Additionally there is suggestion of wall thickening of the descending colon and rectum.  Vascular/Lymphatic: Normal caliber abdominal aorta. No retroperitoneal lymphadenopathy.  Other: Small amount of fluid  adjacent to the descending colon and within the pelvis.  Musculoskeletal: No aggressive or acute appearing osseous lesions.  IMPRESSION: There is focal narrowing of the distal sigmoid colon. Upstream from this focal narrowing there is a large amount of stool throughout the entire colon which is dilated. This focal narrowing is likely causing colonic obstruction. There is associated wall thickening of the descending colon and rectum, potentially secondary to superimposed colitis.  There is a small amount of free fluid adjacent to the descending colon and within the pelvis.  No significant interval change in size of 5 mm left lower lobe pulmonary nodule.   Electronically Signed   By: Lovey Newcomer M.D.   On: 08/17/2014 14:55    Anti-infectives: Anti-infectives    None       Assessment/Plan 1. 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 -She had a colonic stent for 6 months but fell out last spring.  She has refused surgery due to likely ostomy -still refusing today. She has been seen in the past by Dr. Zella Richer. -Options include: 1) do nothing (which she has done), 2) stent colon (she said that this is what she wants in Dr. Bertrum Sol last note of 01/01/2014), 3) diverting colostomy with possible resection of colon cancer. At this time, I do not think that she is a candidate for a primary anastomosis. She could be reversed later, depending on how she does. -GI planning possible stent today -Would recommend calling Dr. Burr Medico her oncologist so she can weight in on her situation.  2. Indeterminate left lower lung nodule - 5 mm. 3. History of anxiety.    LOS: 2 days    Coralie Keens 08/19/2014, 7:51 AM Pager: [phone]  Agree with above. I spoke to Dr. Burr Medico today about her admission.  Discussed with Dr. Carlean Purl.  He was able to get a stent in successfully.  Alphonsa Overall, MD, [facility] Pager: [phone] Office phone:  [phone]

## 2014-08-19 NOTE — Op Note (Signed)
[facility] [place], [id number]   FLEXIBLE SIGMOIDOSCOPY PROCEDURE REPORT  PATIENT: [name], [name]  MR#: [id number] BIRTHDATE: 1951-01-16 , 52  yrs. old GENDER: female ENDOSCOPIST: Gatha Mayer, MD, [facility] PROCEDURE DATE:  08/19/2014 PROCEDURE:   Sigmoidoscopy with stent ASA CLASS:   Class III INDICATIONS:Stent placement. MEDICATIONS: Monitored anesthesia care and Per Anesthesia  DESCRIPTION OF PROCEDURE:   After the risks benefits and alternatives of the procedure were thoroughly explained, informed consent was obtained.  Digital exam revealed a palpable proximal rectal mass. The [id number] ([id number])  endoscope was introduced through the anus  and advanced to the sigmoid colon , The exam was Without limitations.    The quality of the prep was The overall prep quality was fair. .  The instrument was then slowly withdrawn as the mucosa was fully examined.         COLON FINDINGS: Malignant obstructing stricture in rectosigmoid - pinhole seen.  I was able to use fluoro and direct visualization to advance a biliary wire through stricture.  Flusing catheter then used to inject contrast revealing short fairly focal stricture.  A 6 cm long uncovered self-expanding metal stent was placed with direct and fluoroscopic visualization.  It was successful and allowed a large amount of liquid stool and gas to escape. Retroflexion was not performed.    The scope was then withdrawn from the patient and the procedure terminated.  COMPLICATIONS: There were no immediate complications.  ENDOSCOPIC IMPRESSION: Malignant obstructing stricture in rectosigmoid - pinhole seen. A 6cm long uncovered self-expanding metal stent was placed with direct and fluoroscopic visualization.  RECOMMENDATIONS: Full liquids and then low resideu diet BID MiraLax    eSigned:  Gatha Mayer, MD, [facility] 08/19/2014 12:33 PM

## 2014-08-19 NOTE — Progress Notes (Signed)
[facility] Progress Note   OLIA HINDERLITER MVE:[id number] DOB: [birth year] DOA: 08/17/2014 PCP: [facility]  Brief narrative: [name] [name] is a 64 y.o. female with stage 4 colon cancer being treated with chemo presents with vomiting starting last night. She is also having cramping of her abdomen. She has a known sigmoid narrowing and has declined surgery in the past but has been continuing chemotherapy.CT scan of the abdomen shows continued narrowing of the sigmoid colon with a significant amount of backup of stool throughout the colon.   Subjective: abd pain controlled with Morphine -has some vomiting last night- had a small BM with the enema.   Assessment/Plan: Principal Problem:   Bowel obstruction due to sigmoid narrowing - discussed plan with Dr Olevia Perches- plan is to try to place stent today -  - cont NPO, IVF, IV Morphine PRN  Active Problems:    Colorectal cancer, stage IV - oncology Dr Burr Medico managing as outpt   Code Status: full code Family Communication:  Disposition Plan: home when stable DVT prophylaxis: SCDs  Consultants: GI  Procedures:   Antibiotics: Anti-infectives    None         Objective: Filed Weights   08/18/14 0620  Weight: 69.4 kg (153 lb)    Intake/Output Summary (Last 24 hours) at 08/19/14 1115 Last data filed at 08/19/14 0935  Gross per 24 hour  Intake 2868.75 ml  Output   1050 ml  Net 1818.75 ml     Vitals Filed Vitals:   08/18/14 1400 08/18/14 2140 08/19/14 0620 08/19/14 1053  BP: 130/55 136/60 133/66 152/81  Pulse: 82 86 84 92  Temp: 99.2 F (37.3 C) 98.7 F (37.1 C) 98.4 F (36.9 C) 97.5 F (36.4 C)  TempSrc: Oral Oral Oral Oral  Resp: 18 18 18 24   Height:      Weight:      SpO2: 100% 99% 99% 100%    Exam: General: AAO x3, No acute respiratory distress Lungs: Clear to auscultation bilaterally without wheezes or crackles Cardiovascular: Regular rate and rhythm without murmur gallop or rub normal S1  and S2 Abdomen: mild diffuse tenderness, Distended, soft, bowel sounds decreased, no rebound, no ascites, no appreciable mass Extremities: No significant cyanosis, clubbing, or edema bilateral lower extremities  Data Reviewed: Basic Metabolic Panel:  Recent Labs Lab 08/16/14 1121 08/17/14 1201 08/18/14 0550 08/19/14 0546  NA 138 140 137 135  K 4.0 3.7 2.9* 3.0*  CL  --  109 110 107  CO2 24 21 20 23   GLUCOSE 104 106* 110* 136*  BUN 10.8 12 8 6   CREATININE 0.8 0.67 0.55 0.59  CALCIUM 11.0* 11.4* 9.7 10.0   Liver Function Tests:  Recent Labs Lab 08/16/14 1121 08/17/14 1201  AST 14 20  ALT 7 11  ALKPHOS 60 59  BILITOT 0.26 0.6  PROT 7.9 8.3  ALBUMIN 3.5 4.0    Recent Labs Lab 08/17/14 1201  LIPASE 33   No results for input(s): AMMONIA in the last 168 hours. CBC:  Recent Labs Lab 08/16/14 1121 08/17/14 1201 08/18/14 0550  WBC 6.6 6.9 6.8  NEUTROABS 4.3 4.8  --   HGB 10.9* 12.2 10.5*  HCT 34.6* 36.9 33.1*  MCV 85.9 86.0 86.4  PLT 325 326 317   Cardiac Enzymes: No results for input(s): CKTOTAL, CKMB, CKMBINDEX, TROPONINI in the last 168 hours. BNP (last 3 results) No results for input(s): BNP in the last 8760 hours.  ProBNP (last 3 results) No results for  input(s): PROBNP in the last 8760 hours.  CBG: No results for input(s): GLUCAP in the last 168 hours.  No results found for this or any previous visit (from the past 240 hour(s)).   Studies:  Recent x-ray studies have been reviewed in detail by the Attending Physician  Scheduled Meds:  Scheduled Meds: . [facility] Hold] antiseptic oral rinse  7 mL Mouth Rinse q12n4p  . [MAR Hold] chlorhexidine  15 mL Mouth Rinse BID  . [MAR Hold] polyethylene glycol  17 g Oral TID  . [MAR Hold] potassium chloride  10 mEq Intravenous Q1 Hr x 4   Continuous Infusions: . sodium chloride 1,000 mL (08/17/14 1216)  . sodium chloride    . dextrose 5 % and 0.45 % NaCl with KCl 20 mEq/L 125 mL/hr at 08/19/14 0052  .  lactated ringers    . lactated ringers 1,000 mL (08/19/14 1056)    Time spent on care of this patient: 64min   Avie Checo, MD 08/19/2014, 11:15 AM  LOS: 2 days   [facility] Office  [phone] Pager - Text Page per [email or web address]  If 7PM-7AM, please contact night-coverage [email or web address]

## 2014-08-19 NOTE — Anesthesia Preprocedure Evaluation (Signed)
Anesthesia Evaluation  Patient identified by MRN, date of birth, ID band Patient awake    Reviewed: Allergy & Precautions, H&P , NPO status , Patient's Chart, lab work & pertinent test results  Airway Mallampati: II  TM Distance: >3 FB Neck ROM: full    Dental no notable dental hx.    Pulmonary neg pulmonary ROS, former smoker,  breath sounds clear to auscultation  Pulmonary exam normal       Cardiovascular Exercise Tolerance: Good negative cardio ROS  Rhythm:regular Rate:Normal  tachycardia   Neuro/Psych negative neurological ROS  negative psych ROS   GI/Hepatic Neg liver ROS, Colon mass   Endo/Other  negative endocrine ROS  Renal/GU negative Renal ROS  negative genitourinary   Musculoskeletal   Abdominal   Peds  Hematology negative hematology ROS (+) anemia , hgb 10.5   Anesthesia Other Findings   Reproductive/Obstetrics negative OB ROS                             Anesthesia Physical Anesthesia Plan  ASA: III  Anesthesia Plan: MAC   Post-op Pain Management:    Induction:   Airway Management Planned:   Additional Equipment:   Intra-op Plan:   Post-operative Plan:   Informed Consent: I have reviewed the patients History and Physical, chart, labs and discussed the procedure including the risks, benefits and alternatives for the proposed anesthesia with the patient or authorized representative who has indicated his/her understanding and acceptance.   Dental Advisory Given  Plan Discussed with: CRNA and Surgeon  Anesthesia Plan Comments:         Anesthesia Quick Evaluation

## 2014-08-20 ENCOUNTER — Encounter (HOSPITAL_COMMUNITY): Payer: Self-pay | Admitting: Internal Medicine

## 2014-08-20 DIAGNOSIS — K5649 Other impaction of intestine: Secondary | ICD-10-CM

## 2014-08-20 DIAGNOSIS — C19 Malignant neoplasm of rectosigmoid junction: Secondary | ICD-10-CM

## 2014-08-20 LAB — BASIC METABOLIC PANEL
ANION GAP: 8 (ref 5–15)
BUN: <5 mg/dL — ABNORMAL LOW (ref 6–23)
CO2: 22 mmol/L (ref 19–32)
Calcium: 9.6 mg/dL (ref 8.4–10.5)
Chloride: 109 mmol/L (ref 96–112)
Creatinine, Ser: 0.59 mg/dL (ref 0.50–1.10)
GFR calc non Af Amer: >90 mL/min (ref >90)
Glucose, Bld: 111 mg/dL — ABNORMAL HIGH (ref 70–99)
POTASSIUM: 4.1 mmol/L (ref 3.5–5.1)
SODIUM: 139 mmol/L (ref 135–145)

## 2014-08-20 LAB — CLOSTRIDIUM DIFFICILE BY PCR: Toxigenic C. Difficile by PCR: NEGATIVE

## 2014-08-20 NOTE — Progress Notes (Signed)
Discharge instructions given to patient. Questions answered 

## 2014-08-20 NOTE — Progress Notes (Signed)
    Progress Note   Subjective  feels great. Having BMs again   Objective   Vital signs in last 24 hours: Temp:  [97.5 F (36.4 C)-98.9 F (37.2 C)] 98.9 F (37.2 C) (02/16 0615) Pulse Rate:  [75-119] 81 (02/16 0615) Resp:  [15-24] 16 (02/16 0615) BP: (119-158)/(67-81) 119/72 mmHg (02/16 0615) SpO2:  [79 %-100 %] 100 % (02/16 0615) Last BM Date: 08/20/14 General:    Pleasant black female in NAD Abdomen:  Soft, nontender and nondistended. Normal bowel sounds. Extremities:  Without edema. Neurologic:  Alert and oriented,  grossly normal neurologically. Psych:  Cooperative. Normal mood and affect.   Studies/Results: Dg Abd Portable 1v  08/19/2014   CLINICAL DATA:  Status post rectosigmoid stool  EXAM: PORTABLE ABDOMEN - 1 VIEW  COMPARISON:  08/17/2014  FINDINGS: A recently placed stent is noted in the region of the rectosigmoid oriented in an anterior to posterior position. This appears to lie in the area of focal narrowing seen on recent CT examination. Some mild persistent dilatation of the transverse colon is noted. The stone is again noted in the lower pole of the left kidney. No other focal abnormality is seen.  IMPRESSION: New rectosigmoid stent which appears to be in appropriate position. Some gaseous distension of the transverse colon remains.  Stable left renal stone.   Electronically Signed   By: Inez Catalina M.D.   On: 08/19/2014 13:40      Assessment / Plan:    1. Rectosigmoid colon cancer causing obstruction, s/p stent replacement yesterday. Stent in appropriate position on KUB post procedure. She feels great, having BMs again. She didn't take Miralax given spontaneously BMs but advised to use it as BMs slow down.   2. Hypokalemia, resolved     LOS: 3 days   Tye Savoy  08/20/2014, 9:06 AM

## 2014-08-20 NOTE — Discharge Summary (Signed)
Physician Discharge Summary  [name] [name] MLY:[id number] DOB: September 12, 1950 DOA: 08/17/2014  PCP: [facility]  Admit date: 08/17/2014 Discharge date: 08/20/2014  Time spent:45 minutes   Discharge Condition: stable Diet recommendation: regular diet as tolerated  Discharge Diagnoses:  Principal Problem:   Bowel obstruction Active Problems:   Rectal mass   Colorectal cancer, stage IV   History of present illness:  [name] [name] is a 64 y.o. female with stage 4 colon cancer being treated with chemo presents with vomiting starting last night. She is also having cramping of her abdomen. She has a known sigmoid narrowing and has declined surgery in the past but has been continuing chemotherapy. She had stenting of the sigmoid but stent came out about 1 yr ago. CT scan of the abdomen shows continued narrowing of the sigmoid colon with a significant amount of backup of stool throughout the colon.  Hospital Course:  Principal Problem:  Bowel obstruction due to sigmoid narrowing - discussed plan with Dr Olevia Perches on admission - she was offered surgical correction with Colostomy but she declined -  stent placed in Sigmoid colon yesterday by Dr Carlean Purl with resultant resolution of obstruction and numerous BMs- abdominal distension and pain now resolved-    Active Problems:   Colorectal cancer, stage IV - oncology, Dr Burr Medico, managing as outpt   Procedures: Sigmoidoscopy - report as follows: Malignant obstructing stricture in rectosigmoid - pinhole seen. A 6cm long uncovered self-expanding metal stent was placed with direct and fluoroscopic visualization.   Consultations:  Surgery and GI  Discharge Exam: Filed Weights   08/18/14 8127  Weight: 69.4 kg (153 lb)   Filed Vitals:   08/20/14 1120  BP: 151/88  Pulse: 77  Temp: 98.9 F (37.2 C)  Resp: 16    General: AAO x 3, no distress Cardiovascular: RRR, no murmurs  Respiratory: clear to auscultation bilaterally GI:  soft, non-tender, non-distended, bowel sound positive  Discharge Instructions You were cared for by a hospitalist during your hospital stay. If you have any questions about your discharge medications or the care you received while you were in the hospital after you are discharged, you can call the unit and asked to speak with the hospitalist on call if the hospitalist that took care of you is not available. Once you are discharged, your primary care physician will handle any further medical issues. Please note that NO REFILLS for any discharge medications will be authorized once you are discharged, as it is imperative that you return to your primary care physician (or establish a relationship with a primary care physician if you do not have one) for your aftercare needs so that they can reassess your need for medications and monitor your lab values.      Discharge Instructions    Diet - low sodium heart healthy    Complete by:  As directed      Increase activity slowly    Complete by:  As directed             Medication List    TAKE these medications        capecitabine 500 MG tablet  Commonly known as:  XELODA  Take 3 tablets (1,500 mg total) by mouth 2 (two) times daily after a meal.     HYDROcodone-acetaminophen 5-325 MG per tablet  Commonly known as:  NORCO  Take 1-2 tablets by mouth every 6 (six) hours as needed.     lidocaine-prilocaine cream  Commonly known as:  EMLA  Apply 1 application topically as needed. Apply to port site one hour before treatment and cover with plastic wrap.     loperamide 2 MG tablet  Commonly known as:  IMODIUM A-D  Take 2 at onset of diarrhea, then 1 every 2hrs until 12hr without a BM. May take 2 tab every 4hrs at bedtime. If diarrhea recurs repeat.     LORazepam 0.5 MG tablet  Commonly known as:  ATIVAN  Take 1 tablet (0.5 mg total) by mouth every 8 (eight) hours as needed (nausea/vomiting).     nystatin 100000 UNIT/ML suspension  Commonly  known as:  MYCOSTATIN  Take 5 mLs (500,000 Units total) by mouth 4 (four) times daily.     ondansetron 8 MG tablet  Commonly known as:  ZOFRAN  Take 8mg  by mouth every 12 hours as needed for nausea/vomiting.  Take 8 mg by mouth  30 minutes before taking chemo pills, and as needed for nausea/vomiting.     OVER THE COUNTER MEDICATION  Take 2 tablets by mouth daily as needed (constipation). Cleanse more.     polyethylene glycol powder powder  Commonly known as:  GLYCOLAX/MIRALAX  TAKE 34 G (2 DOSES) BY MOUTH DAILY AS NEEDED FOR MILD CONSTIPATION OR MODERATE CONSTIPATION     PROBIOTIC PO  Take 1 tablet by mouth once.     prochlorperazine 10 MG tablet  Commonly known as:  COMPAZINE  TAKE 1 TABLET (10 MG TOTAL) BY MOUTH EVERY 6 (SIX) HOURS AS NEEDED (NAUSEA OR VOMITING).       Allergies  Allergen Reactions  . Codeine Itching and Nausea And Vomiting      The results of significant diagnostics from this hospitalization (including imaging, microbiology, ancillary and laboratory) are listed below for reference.    Significant Diagnostic Studies: Ct Abdomen Pelvis W Contrast  08/17/2014   CLINICAL DATA:  Right-sided abdominal cramping, emesis and constipation.  EXAM: CT ABDOMEN AND PELVIS WITH CONTRAST  TECHNIQUE: Multidetector CT imaging of the abdomen and pelvis was performed using the standard protocol following bolus administration of intravenous contrast.  CONTRAST:  129mL OMNIPAQUE IOHEXOL 300 MG/ML  SOLN  COMPARISON:  CT 07/01/2014  FINDINGS: Lower chest: Visualization of the lower thorax demonstrates a 5 mm left lower lobe pulmonary nodule (image 7; series 4), stable from prior examination. Dependent atelectasis no pleural effusion. Normal heart size.  Hepatobiliary: Liver is normal in size and contour. Unchanged large hemangioma within the right hepatic lobe. Unchanged partially calcified lesion within the left hepatic lobe. Scattered liver cysts are not significantly changed.  Gallbladder is unremarkable.  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  Adrenals/Urinary Tract: Normal bilateral adrenal glands. Kidneys enhance symmetrically with contrast. Unchanged nonobstructing stone inferior pole left kidney. No hydronephrosis.  Stomach/Bowel: Oral contrast material is demonstrated within the stomach and small bowel. No small bowel dilatation. There is a large amount of stool throughout the colon which is dilated. Small amount of fluid adjacent to the descending colon and within the pelvis. There is persistent asymmetric wall thickening involving the sigmoid colon. Focal narrowing of the distal sigmoid colon (image 78; series 2). Additionally there is suggestion of wall thickening of the descending colon and rectum.  Vascular/Lymphatic: Normal caliber abdominal aorta. No retroperitoneal lymphadenopathy.  Other: Small amount of fluid adjacent to the descending colon and within the pelvis.  Musculoskeletal: No aggressive or acute appearing osseous lesions.  IMPRESSION: There is focal narrowing of the distal sigmoid colon. Upstream from this focal  narrowing there is a large amount of stool throughout the entire colon which is dilated. This focal narrowing is likely causing colonic obstruction. There is associated wall thickening of the descending colon and rectum, potentially secondary to superimposed colitis.  There is a small amount of free fluid adjacent to the descending colon and within the pelvis.  No significant interval change in size of 5 mm left lower lobe pulmonary nodule.   Electronically Signed   By: Lovey Newcomer M.D.   On: 08/17/2014 14:55   Dg Abd Acute W/chest  08/05/2014   CLINICAL DATA:  Abdominal pain, nausea and vomiting, constipation. Currently undergoing chemotherapy for colon cancer.  EXAM: ACUTE ABDOMEN SERIES (ABDOMEN 2 VIEW & CHEST 1 VIEW)  COMPARISON:  07/24/2014  FINDINGS: Right-sided Port-A-Cath in place with tip over the distal SVC. Heart  size is normal. The lungs are clear. No pleural effusion. No free air beneath the diaphragms.  Moderate stool burden identified. Mild colonic gaseous prominence measuring 6.7 cm with a few air-fluid levels noted. No gas-filled dilated loops of small bowel is identified. No abnormal radiopacity. No acute osseous finding.  IMPRESSION: Moderate stool burden without evidence for small bowel obstruction.  Mild colonic gaseous prominence with a few air-fluid levels identified which are nonspecific but the colonic diameter is not suggestive of overt obstruction.   Electronically Signed   By: Conchita Paris M.D.   On: 08/05/2014 11:56   Dg Abd Portable 1v  08/19/2014   CLINICAL DATA:  Status post rectosigmoid stool  EXAM: PORTABLE ABDOMEN - 1 VIEW  COMPARISON:  08/17/2014  FINDINGS: A recently placed stent is noted in the region of the rectosigmoid oriented in an anterior to posterior position. This appears to lie in the area of focal narrowing seen on recent CT examination. Some mild persistent dilatation of the transverse colon is noted. The stone is again noted in the lower pole of the left kidney. No other focal abnormality is seen.  IMPRESSION: New rectosigmoid stent which appears to be in appropriate position. Some gaseous distension of the transverse colon remains.  Stable left renal stone.   Electronically Signed   By: Inez Catalina M.D.   On: 08/19/2014 13:40    Microbiology: No results found for this or any previous visit (from the past 240 hour(s)).   Labs: Basic Metabolic Panel:  Recent Labs Lab 08/16/14 1121 08/17/14 1201 08/18/14 0550 08/19/14 0546 08/20/14 0723  NA 138 140 137 135 139  K 4.0 3.7 2.9* 3.0* 4.1  CL  --  109 110 107 109  CO2 24 21 20 23 22   GLUCOSE 104 106* 110* 136* 111*  BUN 10.8 12 8 6  <5*  CREATININE 0.8 0.67 0.55 0.59 0.59  CALCIUM 11.0* 11.4* 9.7 10.0 9.6   Liver Function Tests:  Recent Labs Lab 08/16/14 1121 08/17/14 1201  AST 14 20  ALT 7 11  ALKPHOS  60 59  BILITOT 0.26 0.6  PROT 7.9 8.3  ALBUMIN 3.5 4.0    Recent Labs Lab 08/17/14 1201  LIPASE 33   No results for input(s): AMMONIA in the last 168 hours. CBC:  Recent Labs Lab 08/16/14 1121 08/17/14 1201 08/18/14 0550  WBC 6.6 6.9 6.8  NEUTROABS 4.3 4.8  --   HGB 10.9* 12.2 10.5*  HCT 34.6* 36.9 33.1*  MCV 85.9 86.0 86.4  PLT 325 326 317   Cardiac Enzymes: No results for input(s): CKTOTAL, CKMB, CKMBINDEX, TROPONINI in the last 168 hours. BNP: BNP (last 3 results)  No results for input(s): BNP in the last 8760 hours.  ProBNP (last 3 results) No results for input(s): PROBNP in the last 8760 hours.  CBG: No results for input(s): GLUCAP in the last 168 hours.     SignedDebbe Odea, MD [facility] 08/20/2014, 11:54 AM

## 2014-08-20 NOTE — Progress Notes (Signed)
[facility] Progress Note  1 Day Post-Op  Subjective: Pt says she's had lots of loose BM's since stent placement.  She says her pain and distension has resolved.  She's having good flatus and urination.  Pt feels great.  Objective: Vital signs in last 24 hours: Temp:  [97.5 F (36.4 C)-98.9 F (37.2 C)] 98.9 F (37.2 C) (02/16 0615) Pulse Rate:  [75-119] 81 (02/16 0615) Resp:  [15-24] 16 (02/16 0615) BP: (119-158)/(67-81) 119/72 mmHg (02/16 0615) SpO2:  [79 %-100 %] 100 % (02/16 0615) Last BM Date: 08/20/14  Intake/Output from previous day: 02/15 0701 - 02/16 0700 In: 2300 [I.V.:2300] Out: 1300 [Urine:1300] Intake/Output this shift:    PE: Gen:  Alert, NAD, pleasant Abd: Soft, NT/ND, +BS, no HSM   Lab Results:   Recent Labs  08/17/14 1201 08/18/14 0550  WBC 6.9 6.8  HGB 12.2 10.5*  HCT 36.9 33.1*  PLT 326 317   BMET  Recent Labs  08/19/14 0546 08/20/14 0723  NA 135 139  K 3.0* 4.1  CL 107 109  CO2 23 22  GLUCOSE 136* 111*  BUN 6 <5*  CREATININE 0.59 0.59  CALCIUM 10.0 9.6   PT/INR No results for input(s): LABPROT, INR in the last 72 hours. CMP     Component Value Date/Time   NA 139 08/20/2014 0723   NA 138 08/16/2014 1121   K 4.1 08/20/2014 0723   K 4.0 08/16/2014 1121   CL 109 08/20/2014 0723   CO2 22 08/20/2014 0723   CO2 24 08/16/2014 1121   GLUCOSE 111* 08/20/2014 0723   GLUCOSE 104 08/16/2014 1121   BUN <5* 08/20/2014 0723   BUN 10.8 08/16/2014 1121   CREATININE 0.59 08/20/2014 0723   CREATININE 0.8 08/16/2014 1121   CALCIUM 9.6 08/20/2014 0723   CALCIUM 11.0* 08/16/2014 1121   PROT 8.3 08/17/2014 1201   PROT 7.9 08/16/2014 1121   ALBUMIN 4.0 08/17/2014 1201   ALBUMIN 3.5 08/16/2014 1121   AST 20 08/17/2014 1201   AST 14 08/16/2014 1121   ALT 11 08/17/2014 1201   ALT 7 08/16/2014 1121   ALKPHOS 59 08/17/2014 1201   ALKPHOS 60 08/16/2014 1121   BILITOT 0.6 08/17/2014 1201   BILITOT 0.26 08/16/2014 1121   GFRNONAA >90 08/20/2014 0723   GFRAA >90 08/20/2014 0723   Lipase     Component Value Date/Time   LIPASE 33 08/17/2014 1201       Studies/Results: Dg Abd Portable 1v  08/19/2014   CLINICAL DATA:  Status post rectosigmoid stool  EXAM: PORTABLE ABDOMEN - 1 VIEW  COMPARISON:  08/17/2014  FINDINGS: A recently placed stent is noted in the region of the rectosigmoid oriented in an anterior to posterior position. This appears to lie in the area of focal narrowing seen on recent CT examination. Some mild persistent dilatation of the transverse colon is noted. The stone is again noted in the lower pole of the left kidney. No other focal abnormality is seen.  IMPRESSION: New rectosigmoid stent which appears to be in appropriate position. Some gaseous distension of the transverse colon remains.  Stable left renal stone.   Electronically Signed   By: Inez Catalina M.D.   On: 08/19/2014 13:40    Anti-infectives: Anti-infectives    None       Assessment/Plan 1. 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 -She had  a colonic stent for 6 months but fell out last spring. She has refused surgery due to likely ostomy -still refusing today. She has been seen in the past by Dr. Zella Richer. -Successful stent placement by Dr. Carlean Purl yesterday with good BM results -Will sign off, please call with questions/concerns.  She can follow up with Dr. Zella Richer as needed.  2. Indeterminate left lower lung nodule - 5 mm. 3. History of anxiety.    LOS: 3 days    Coralie Keens 08/20/2014, 9:29 AM Pager: [phone]  Agree with above. I have talked to Dr. Zella Richer about her hospital course. Let us know if we can help.  Alphonsa Overall, MD, [facility] Pager: [phone] Office phone:  [phone]

## 2014-08-22 ENCOUNTER — Telehealth: Payer: Self-pay | Admitting: Internal Medicine

## 2014-08-22 ENCOUNTER — Telehealth: Payer: Self-pay

## 2014-08-22 NOTE — Telephone Encounter (Signed)
Patient calling to let Dr. Carlean Purl know that she has not had a bowel movement since Tuesday. She feels great. Denies nausea or pain. She did have some clear mucous from rectum on Wednesday. Reassured patient.

## 2014-08-22 NOTE — Telephone Encounter (Signed)
Patient given recommendations. 

## 2014-08-22 NOTE — Telephone Encounter (Signed)
Returning pt call from [phone]. Pt had colon stent placed on 2/16 and is having symptoms. Denied pain. Referred pt to Dr Carlean Purl who performed stent placement. Made sure she had phone number for his office. Told her to call back as needed.

## 2014-08-22 NOTE — Telephone Encounter (Signed)
She should stay on MiraLax, if does not start having bowel movements again let me know - she can even call over weekend - I am on call

## 2014-09-13 ENCOUNTER — Telehealth: Payer: Self-pay | Admitting: Hematology

## 2014-09-13 ENCOUNTER — Ambulatory Visit (HOSPITAL_BASED_OUTPATIENT_CLINIC_OR_DEPARTMENT_OTHER): Payer: Medicaid Other | Admitting: Hematology

## 2014-09-13 ENCOUNTER — Ambulatory Visit (HOSPITAL_BASED_OUTPATIENT_CLINIC_OR_DEPARTMENT_OTHER): Payer: Medicaid Other

## 2014-09-13 ENCOUNTER — Other Ambulatory Visit (HOSPITAL_BASED_OUTPATIENT_CLINIC_OR_DEPARTMENT_OTHER): Payer: Medicaid Other

## 2014-09-13 VITALS — Ht 65.0 in | Wt 151.2 lb

## 2014-09-13 DIAGNOSIS — Z95828 Presence of other vascular implants and grafts: Secondary | ICD-10-CM

## 2014-09-13 DIAGNOSIS — C787 Secondary malignant neoplasm of liver and intrahepatic bile duct: Secondary | ICD-10-CM

## 2014-09-13 DIAGNOSIS — C19 Malignant neoplasm of rectosigmoid junction: Secondary | ICD-10-CM

## 2014-09-13 DIAGNOSIS — C189 Malignant neoplasm of colon, unspecified: Secondary | ICD-10-CM

## 2014-09-13 DIAGNOSIS — C7989 Secondary malignant neoplasm of other specified sites: Secondary | ICD-10-CM

## 2014-09-13 DIAGNOSIS — Z452 Encounter for adjustment and management of vascular access device: Secondary | ICD-10-CM

## 2014-09-13 LAB — COMPREHENSIVE METABOLIC PANEL (CC13)
ALBUMIN: 3.3 g/dL — AB (ref 3.5–5.0)
ALT: 9 U/L (ref 0–55)
AST: 14 U/L (ref 5–34)
Alkaline Phosphatase: 66 U/L (ref 40–150)
Anion Gap: 9 mEq/L (ref 3–11)
BUN: 8.9 mg/dL (ref 7.0–26.0)
CO2: 21 mEq/L — ABNORMAL LOW (ref 22–29)
Calcium: 10.8 mg/dL — ABNORMAL HIGH (ref 8.4–10.4)
Chloride: 110 mEq/L — ABNORMAL HIGH (ref 98–109)
Creatinine: 0.7 mg/dL (ref 0.6–1.1)
EGFR: >90 mL/min/{1.73_m2} (ref >90)
Glucose: 86 mg/dl (ref 70–140)
Potassium: 4 mEq/L (ref 3.5–5.1)
SODIUM: 140 meq/L (ref 136–145)
Total Bilirubin: <0.2 mg/dL (ref 0.20–1.20)
Total Protein: 7.6 g/dL (ref 6.4–8.3)

## 2014-09-13 LAB — CBC WITH DIFFERENTIAL/PLATELET
BASO%: 0.2 % (ref 0.0–2.0)
BASOS ABS: 0 10*3/uL (ref 0.0–0.1)
EOS%: 5.8 % (ref 0.0–7.0)
Eosinophils Absolute: 0.3 10*3/uL (ref 0.0–0.5)
HCT: 34.9 % (ref 34.8–46.6)
HGB: 11.3 g/dL — ABNORMAL LOW (ref 11.6–15.9)
LYMPH#: 1.2 10*3/uL (ref 0.9–3.3)
LYMPH%: 19.7 % (ref 14.0–49.7)
MCH: 28 pg (ref 25.1–34.0)
MCHC: 32.4 g/dL (ref 31.5–36.0)
MCV: 86.4 fL (ref 79.5–101.0)
MONO#: 0.4 10*3/uL (ref 0.1–0.9)
MONO%: 7 % (ref 0.0–14.0)
NEUT%: 67.3 % (ref 38.4–76.8)
NEUTROS ABS: 4 10*3/uL (ref 1.5–6.5)
Platelets: 284 10*3/uL (ref 145–400)
RBC: 4.04 10*6/uL (ref 3.70–5.45)
RDW: 13.9 % (ref 11.2–14.5)
WBC: 5.9 10*3/uL (ref 3.9–10.3)

## 2014-09-13 LAB — CEA: CEA: 4.1 ng/mL (ref 0.0–5.0)

## 2014-09-13 MED ORDER — CAPECITABINE 500 MG PO TABS: 1500.0000 mg | ORAL_TABLET | Freq: Two times a day (BID) | ORAL | Status: DC

## 2014-09-13 MED ORDER — SODIUM CHLORIDE 0.9 % IJ SOLN
10.0000 mL | INTRAMUSCULAR | Status: DC | PRN
  Administered 2014-09-13: 10 mL via INTRAVENOUS
  Filled 2014-09-13: qty 10

## 2014-09-13 MED ORDER — HEPARIN SOD (PORK) LOCK FLUSH 100 UNIT/ML IV SOLN
500.0000 [IU] | Freq: Once | INTRAVENOUS | Status: AC
  Administered 2014-09-13: 500 [IU] via INTRAVENOUS
  Filled 2014-09-13: qty 5

## 2014-09-13 NOTE — Patient Instructions (Signed)

## 2014-09-13 NOTE — Telephone Encounter (Signed)
gv and printed appt sched and avs for opt for April....sed added tx.

## 2014-09-13 NOTE — Progress Notes (Signed)
[place] PROGRESS NOTE    DIAGNOSIS: Metastatic colon cancer to liver   MOLECULAR FEATURES ([facility]):   Chief complaint: Follow-up colon cancer  CURRENT TREATMENT:  Oral Xeloda 1523m (8570mm2) bid, 2 weeks on, one week off, as maintenance therapy started 07/16/2014     Colorectal cancer, stage IV   05/22/2013 - 05/27/2013 Hospital Admission A/w abdominal distention and constipation for 2 days CT scan of her abdomen and pelvis showed area of bowel wall thickening at the rectosigmoid junction concerning for malignancy.   05/22/2013 Imaging CT of Abdomen: bowel wall thickening with shouldering of the proximal and distal margins, involving therectosigmoid junction, supicous liver spleen mets. Colonsocopy recommended.    05/23/2013 Tumor Marker CEA 225.7   05/24/2013 Imaging MRI of abdomen: 3.7 x 4.4 cm enhancing lesion along the superior spleen, indeterminate but worrisome for metastasis.2.7 x 3.9 cm enhancing lesion in the medial segment left hepatic lobe,    05/25/2013 Pathology Results Diagnosis Rectum, biopsy, proximal - INVASIVE ADENOCARCINOMA.   05/25/2013 Procedure Flex sig: (Dr. JaArdis Hughs 5-6 cm obstructing rectosigmoid mass distal end 10 cm from anal verge; mass biopsed and then stented 9 cm long, 22 cm diameter uncovered SEM.    05/25/2013 Initial Diagnosis Colorectal cancer, stage IV   06/25/2013 Procedure R port a cath placement.   07/09/2013 - 08/20/2013 Chemotherapy FOLFOX q 2 weeks started.  Not elgible for clinical trial and bevacimab based on stent and high risk for perforation. She received a total of 4 cycles.    07/30/2013 Tumor Marker KRAS positive.  p53, APC identified. Mismatch Repair (MMR) preserved.    08/06/2013 Adverse Reaction Complains of darkening of her hands with peeling.    08/27/2013 Family History She was seen by Genetic couselor.  She declined testing today, but will call and reschedule an appointment should she decide to  pursue testing.   09/07/2013 Imaging CT abdomen: Mixed response to therapy. Response to therapy of hepatic metastasis. right hepatic hemangiomas are again identified. Other too small to characterize liver lesions are felt to be similar but are indeterminate.. Enlargement of a splenic lesions   09/13/2013 - 06/10/2014 Chemotherapy Second line chemo FOLFIRI for a total of 16 cycles    09/13/2013 Tumor Marker CEA 33.5   09/13/2013 Treatment Plan Change Reviewed scans consistent with mixed response.  Switch to FOLFIRI, she received a total of 16 cycles, with a few breaks due to hospitalization and chemo holiday.    10/29/2013 Adverse Reaction Patient reports intense abdominal cramping lasting the first few days on chemotherapy.  Starting Hyoscyamine 0.125 mg q 6 hours prn.    11/15/2013 - 11/15/2013 Hospital Admission Patient presented to ED with migrated stent.  Stent retrieved by Dr. JaArdis Hughs Imaging negative for perforation.   Discussed in conference with referral to Dr. RoZella Richerade.    11/23/2013 Imaging CT Chest. 1. Stable left lower lobe 5 mm pulmonary nodule. 2. Interval increase in size splenic mass is concerning formetastatic lesion.3. Interval calcification of the left hepatic lobe metastasis. Noevidence of active residual disease by CT.   07/01/2014 Imaging CT restaging scan showed stable disease, improved liver lesion.    07/16/2014 -  Chemotherapy maintenance chemo with capacitain 10008m2 (1800m51mid started, dose decresed to 1500mg52m on C1D8 due to tolerance issue. Pt declined surgery.     INTERVAL HISTORY:  [name] [name].64 female with a history of Stage IV colon cancer is here for follow-up.  She was admitted for bowel obstruction  from the sigmoid colon mass on 08/17/2014. She declined surgery and had a stent placement. Her symptoms quickly resolved, she was discharged home on Fabry 16 2016.   She restart capecitabine one week after hospital discharge and tolerated very well. She is  off this week. She has mild cough, mild sore throat, no sputum production, no fever or chills. She feels well overall, has good appetite and energy level, has regular BM daily, no bleeding. She has strong faith in god, and goes to church regularly. Her husband is a Theme park manager.   Past Medical History  Diagnosis Date  . Anxiety   . Colon cancer     dx'd 2014    ALLERGIES:  is allergic to codeine.  MEDICATIONS: has a current medication list which includes the following prescription(s): capecitabine, hydrocodone-acetaminophen, lidocaine-prilocaine, lorazepam, nystatin, ondansetron, OVER THE COUNTER MEDICATION, polyethylene glycol powder, probiotic product, prochlorperazine, and loperamide.  SURGICAL HISTORY:  Past Surgical History  Procedure Laterality Date  . Abdominal hysterectomy  2005  . Cesarean section  1976  . Flexible sigmoidoscopy N/A 05/25/2013    Procedure: FLEXIBLE SIGMOIDOSCOPY;  Surgeon: [name], MD;  Location: [facility];  Service: Endoscopy;  Laterality: N/A;  needs floro  . Colonic stent placement N/A 05/25/2013    Procedure: COLONIC STENT PLACEMENT;  Surgeon: [name], MD;  Location: [facility];  Service: Endoscopy;  Laterality: N/A;  . Portacath placement Right 06/25/2013    Procedure: ULTRA SOUND GUIDED INSERTION PORT-A-CATH;  Surgeon: [name], MD;  Location: [place];  Service: General;  Laterality: Right;  . Flexible sigmoidoscopy N/A 08/19/2014    Procedure: FLEXIBLE SIGMOIDOSCOPY;  Surgeon: Gatha Mayer, MD;  Location: [facility];  Service: Endoscopy;  Laterality: N/A;  . Colonic stent placement N/A 08/19/2014    Procedure: COLONIC STENT PLACEMENT;  Surgeon: Gatha Mayer, MD;  Location: [facility];  Service: Endoscopy;  Laterality: N/A;  . Flexible sigmoidoscopy N/A 08/19/2014    Procedure: FLEXIBLE SIGMOIDOSCOPY;  Surgeon: Gatha Mayer, MD;  Location: [facility];  Service: Endoscopy;  Laterality: N/A;  . Colonic  stent placement N/A 08/19/2014    Procedure: COLONIC STENT PLACEMENT;  Surgeon: Gatha Mayer, MD;  Location: [facility];  Service: Endoscopy;  Laterality: N/A;    REVIEW OF SYSTEMS:   Constitutional: Denies fevers, chills or abnormal weight loss Eyes: Denies blurriness of vision Ears, nose, mouth, throat, and face: Denies mucositis or sore throat Respiratory: Denies cough, dyspnea or wheezes Cardiovascular: Denies palpitation, chest discomfort or lower extremity swelling Gastrointestinal:  Denies nausea, heartburn or change in bowel habits Skin: Denies abnormal skin rashes Lymphatics: Denies new lymphadenopathy or easy bruising Neurological:Denies numbness, tingling or new weaknesses Behavioral/Psych: Mood is stable, no new changes  All other systems were reviewed with the patient and are negative.  PHYSICAL EXAMINATION: ECOG PERFORMANCE STATUS: 0-Asymptomatic  Height '5\' 5"'  (1.651 m), weight 151 lb 3.2 oz (68.584 kg).  GENERAL:alert, no distress and comfortable; well-developed, well nourished.  SKIN: skin color, texture, turgor are normal, no rashes or significant lesions; + R Port a cath without tenderness; hyperpigmentation of her hands.  EYES: normal, Conjunctiva are pink and non-injected, sclera clear  OROPHARYNX:no exudate, no erythema and lips, buccal mucosa  NECK: supple, thyroid normal size, non-tender, without nodularity  LYMPH: no palpable lymphadenopathy in the cervical, axillary or supraclavicular  LUNGS: clear to auscultation and percussion with normal breathing effort  HEART: Tachycardic with regular rhythm and no murmurs and no lower extremity edema  ABDOMEN:abdomen soft, non-tender and normal bowel sounds Musculoskeletal:no cyanosis of digits and no clubbing  NEURO: alert & oriented x 3 with fluent speech, no focal motor/sensory deficits EXTREMITIES: Positive for thickening of nail bed, mild pinkish discharge from the left third nail bed, mild tenderness, no skin  erythema or swelling.  Labs:  CBC Latest Ref Rng 09/13/2014 08/18/2014 08/17/2014  WBC 3.9 - 10.3 10e3/uL 5.9 6.8 6.9  Hemoglobin 11.6 - 15.9 g/dL 11.3(L) 10.5(L) 12.2  Hematocrit 34.8 - 46.6 % 34.9 33.1(L) 36.9  Platelets 145 - 400 10e3/uL 284 317 326    CMP Latest Ref Rng 09/13/2014 08/20/2014 08/19/2014  Glucose 70 - 140 mg/dl 86 111(H) 136(H)  BUN 7.0 - 26.0 mg/dL 8.9 <5(L) 6  Creatinine 0.6 - 1.1 mg/dL 0.7 0.59 0.59  Sodium 136 - 145 mEq/L 140 139 135  Potassium 3.5 - 5.1 mEq/L 4.0 4.1 3.0(L)  Chloride 96 - 112 mmol/L - 109 107  CO2 22 - 29 mEq/L 21(L) 22 23  Calcium 8.4 - 10.4 mg/dL 10.8(H) 9.6 10.0  Total Protein 6.4 - 8.3 g/dL 7.6 - -  Total Bilirubin 0.20 - 1.20 mg/dL <0.20 - -  Alkaline Phos 40 - 150 U/L 66 - -  AST 5 - 34 U/L 14 - -  ALT 0 - 55 U/L 9 - -    CEA  Status: Finalresult Visible to patient:  Not Released Nextappt: None Dx:  Metastatic colon cancer to liver           Ref Range 3wk ago (07/25/14) 28moago (07/01/14) 2169mogo (06/10/14) 69m77moo (05/20/14)    CEA 0.0 - 5.0 ng/mL 5.0 4.6 4.0 4.3          RADIOGRAPHIC STUDIES:  CT chest, abdomen and pelvis with iv contrast 07/01/2014 IMPRESSION: 1. No acute findings within the chest abdomen or pelvis. 2. Stable nonspecific small pulmonary nodules measuring up to 5 mm. 3. No change in size of splenic metastasis. 4. Increase and wall thickening an soft tissue stranding involving the sigmoid colon. Cannot rule out local tumor recurrence or local tumor. 5. Stable appearance of the liver. There are 2 hemangiomas in several liver cysts. Treated tumor in the left hepatic lobe has decreased in size from the previous exam.    ASSESSMENT: CarPark Breed 24o. female with a history of stage IV colon adenocarcinoma, KRAS mutation(+), on capecitabine maintenance therapy..   1. Metastatic colorectal Cancer (mCRC) to liver, spleen, stable disease. -She had stable disease after second line  of chemotherapy, now on Capecitabine maintenance. She is tolerating 1500 mg twice daily well. Her blood counts are adequate, we'll start 4th cycle next week. -Restaging after 5-6 cycles. -She has declined surgery multiple times. She has strong faith in God and prefers to maintain her current quality of life.   2. S/p migrated colorectal stent, s/p retrieval on 05/14 and replaced on 08/17/14 due to obstruction  -- She will follow up with GI/Surgery as needed.    3. Family history of Colon cancer.  --Based on her age and family history (sister deceased at age 24 85om colon cancer), referral for genetic testing for Lynch syndrome, i.e. MSI or IHC, would be appropriate. We made a referral to genetics. Her tumor was negative for MSI high (Lynch syndrome). She was seen on 02/23 but declined further testing for genetic syndromes presently.   4. 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  -chronic elevated Ca, stable. She knows to drink more fluids, avoid dehydration -zometa 4mg30m  iv on 07/25/13 and 06/12/2014. She had some abdominal discomfort and after Zometa infusion, and is hesitated to have more at this point. -Continue follow up closely.   5. Abdominal gassy and constipation -Much improved after sigmoid colon stent placement. -Continue laxative and stool softener.   PLAN -I refilled her Capecitabine today and she will start next week -RTC in 3 weeks  -Possible Zometa infusion if her calcium further increases on next visit  All questions were answered. The patient knows to call the clinic with any problems, questions or concerns. We can certainly see the patient much sooner if necessary.  I spent 20 minutes counseling the patient face to face. The total time spent in the appointment was 25 minutes.  Truitt Merle  09/13/2014

## 2014-09-15 ENCOUNTER — Encounter: Payer: Self-pay | Admitting: Hematology

## 2014-10-03 ENCOUNTER — Telehealth: Payer: Self-pay | Admitting: *Deleted

## 2014-10-03 NOTE — Telephone Encounter (Signed)
[name] [name] called to ask for a refill of Xeloda because she is due to restart it on 10/04/14.  She has two refills on the prescription, but she does not have labs or see Dr. Burr Medico until 10/11/14.  Told patient that it is not advisable for her to restart the Xeloda without labs and evaluation, so not to restart Xeloda until she hears from [place].  She is aware that Dr. Burr Medico is out of the office. Please advise patient.

## 2014-10-03 NOTE — Telephone Encounter (Signed)
Spoke with pt and was informed that pt would restart Xeloda on Monday 10/07/14 -  1500 mg po BID for 2 weeks on , and  1 week off.  Instructed pt to get a refill but not to start until further instructions from md on 10/07/14.  Pt voiced understanding.

## 2014-10-11 ENCOUNTER — Telehealth: Payer: Self-pay | Admitting: Hematology

## 2014-10-11 ENCOUNTER — Ambulatory Visit: Payer: Medicaid Other

## 2014-10-11 ENCOUNTER — Ambulatory Visit (HOSPITAL_BASED_OUTPATIENT_CLINIC_OR_DEPARTMENT_OTHER): Payer: Medicaid Other | Admitting: Hematology

## 2014-10-11 ENCOUNTER — Other Ambulatory Visit (HOSPITAL_BASED_OUTPATIENT_CLINIC_OR_DEPARTMENT_OTHER): Payer: Medicaid Other

## 2014-10-11 ENCOUNTER — Ambulatory Visit (HOSPITAL_BASED_OUTPATIENT_CLINIC_OR_DEPARTMENT_OTHER): Payer: Medicaid Other

## 2014-10-11 ENCOUNTER — Encounter: Payer: Self-pay | Admitting: Hematology

## 2014-10-11 VITALS — BP 132/61 | HR 85 | Temp 97.7°F | Resp 18 | Ht 65.0 in | Wt 153.4 lb

## 2014-10-11 DIAGNOSIS — C7989 Secondary malignant neoplasm of other specified sites: Secondary | ICD-10-CM

## 2014-10-11 DIAGNOSIS — C787 Secondary malignant neoplasm of liver and intrahepatic bile duct: Secondary | ICD-10-CM | POA: Diagnosis not present

## 2014-10-11 DIAGNOSIS — C189 Malignant neoplasm of colon, unspecified: Secondary | ICD-10-CM

## 2014-10-11 DIAGNOSIS — C19 Malignant neoplasm of rectosigmoid junction: Secondary | ICD-10-CM

## 2014-10-11 DIAGNOSIS — Z95828 Presence of other vascular implants and grafts: Secondary | ICD-10-CM

## 2014-10-11 DIAGNOSIS — Z5111 Encounter for antineoplastic chemotherapy: Secondary | ICD-10-CM | POA: Diagnosis not present

## 2014-10-11 LAB — CBC WITH DIFFERENTIAL/PLATELET
BASO%: 0.6 % (ref 0.0–2.0)
BASOS ABS: 0 10*3/uL (ref 0.0–0.1)
EOS ABS: 0.4 10*3/uL (ref 0.0–0.5)
EOS%: 7.1 % — ABNORMAL HIGH (ref 0.0–7.0)
HCT: 33.7 % — ABNORMAL LOW (ref 34.8–46.6)
HEMOGLOBIN: 10.7 g/dL — AB (ref 11.6–15.9)
LYMPH%: 24.4 % (ref 14.0–49.7)
MCH: 26.9 pg (ref 25.1–34.0)
MCHC: 31.8 g/dL (ref 31.5–36.0)
MCV: 84.6 fL (ref 79.5–101.0)
MONO#: 0.4 10*3/uL (ref 0.1–0.9)
MONO%: 8 % (ref 0.0–14.0)
NEUT#: 3.4 10*3/uL (ref 1.5–6.5)
NEUT%: 59.9 % (ref 38.4–76.8)
PLATELETS: 261 10*3/uL (ref 145–400)
RBC: 3.98 10*6/uL (ref 3.70–5.45)
RDW: 13.8 % (ref 11.2–14.5)
WBC: 5.6 10*3/uL (ref 3.9–10.3)
lymph#: 1.4 10*3/uL (ref 0.9–3.3)

## 2014-10-11 LAB — COMPREHENSIVE METABOLIC PANEL (CC13)
ALBUMIN: 3.2 g/dL — AB (ref 3.5–5.0)
ALK PHOS: 68 U/L (ref 40–150)
ALT: 9 U/L (ref 0–55)
ANION GAP: 8 meq/L (ref 3–11)
AST: 14 U/L (ref 5–34)
BUN: 15.5 mg/dL (ref 7.0–26.0)
CO2: 23 mEq/L (ref 22–29)
Calcium: 11 mg/dL — ABNORMAL HIGH (ref 8.4–10.4)
Chloride: 108 mEq/L (ref 98–109)
Creatinine: 0.7 mg/dL (ref 0.6–1.1)
EGFR: >90 mL/min/{1.73_m2} (ref >90)
Glucose: 70 mg/dl (ref 70–140)
Potassium: 4.1 mEq/L (ref 3.5–5.1)
SODIUM: 140 meq/L (ref 136–145)
TOTAL PROTEIN: 7.6 g/dL (ref 6.4–8.3)

## 2014-10-11 MED ORDER — SODIUM CHLORIDE 0.9 % IJ SOLN
10.0000 mL | INTRAMUSCULAR | Status: DC | PRN
  Administered 2014-10-11: 10 mL via INTRAVENOUS
  Filled 2014-10-11: qty 10

## 2014-10-11 NOTE — Progress Notes (Signed)
[place] PROGRESS NOTE    DIAGNOSIS: Metastatic colon cancer to liver   MOLECULAR FEATURES ([facility]onOne):   Chief complaint: Follow-up colon cancer  CURRENT TREATMENT:  Oral Xeloda 1576m (8584mm2) bid, 2 weeks on, one week off, as maintenance therapy started 07/16/2014     Colorectal cancer, stage IV   05/22/2013 - 05/27/2013 Hospital Admission A/w abdominal distention and constipation for 2 days CT scan of her abdomen and pelvis showed area of bowel wall thickening at the rectosigmoid junction concerning for malignancy.   05/22/2013 Imaging CT of Abdomen: bowel wall thickening with shouldering of the proximal and distal margins, involving therectosigmoid junction, supicous liver spleen mets. Colonsocopy recommended.    05/23/2013 Tumor Marker CEA 225.7   05/24/2013 Imaging MRI of abdomen: 3.7 x 4.4 cm enhancing lesion along the superior spleen, indeterminate but worrisome for metastasis.2.7 x 3.9 cm enhancing lesion in the medial segment left hepatic lobe,    05/25/2013 Pathology Results Diagnosis Rectum, biopsy, proximal - INVASIVE ADENOCARCINOMA.   05/25/2013 Procedure Flex sig: (Dr. JaArdis Hughs 5-6 cm obstructing rectosigmoid mass distal end 10 cm from anal verge; mass biopsed and then stented 9 cm long, 22 cm diameter uncovered SEM.    05/25/2013 Initial Diagnosis Colorectal cancer, stage IV   06/25/2013 Procedure R port a cath placement.   07/09/2013 - 08/20/2013 Chemotherapy FOLFOX q 2 weeks started.  Not elgible for clinical trial and bevacimab based on stent and high risk for perforation. She received a total of 4 cycles.    07/30/2013 Tumor Marker KRAS positive.  p53, APC identified. Mismatch Repair (MMR) preserved.    08/06/2013 Adverse Reaction Complains of darkening of her hands with peeling.    08/27/2013 Family History She was seen by Genetic couselor.  She declined testing today, but will call and reschedule an appointment should she decide to  pursue testing.   09/07/2013 Imaging CT abdomen: Mixed response to therapy. Response to therapy of hepatic metastasis. right hepatic hemangiomas are again identified. Other too small to characterize liver lesions are felt to be similar but are indeterminate.. Enlargement of a splenic lesions   09/13/2013 - 06/10/2014 Chemotherapy Second line chemo FOLFIRI for a total of 16 cycles    09/13/2013 Tumor Marker CEA 33.5   09/13/2013 Treatment Plan Change Reviewed scans consistent with mixed response.  Switch to FOLFIRI, she received a total of 16 cycles, with a few breaks due to hospitalization and chemo holiday.    10/29/2013 Adverse Reaction Patient reports intense abdominal cramping lasting the first few days on chemotherapy.  Starting Hyoscyamine 0.125 mg q 6 hours prn.    11/15/2013 - 11/15/2013 Hospital Admission Patient presented to ED with migrated stent.  Stent retrieved by Dr. JaArdis Hughs Imaging negative for perforation.   Discussed in conference with referral to Dr. RoZella Richerade.    11/23/2013 Imaging CT Chest. 1. Stable left lower lobe 5 mm pulmonary nodule. 2. Interval increase in size splenic mass is concerning formetastatic lesion.3. Interval calcification of the left hepatic lobe metastasis. Noevidence of active residual disease by CT.   07/01/2014 Imaging CT restaging scan showed stable disease, improved liver lesion.    07/16/2014 -  Chemotherapy maintenance chemo with capacitain 100025m2 (1800m64mid started, dose decresed to 1500mg71m on C1D8 due to tolerance issue. Pt declined surgery.    08/16/2014 - 08/20/2014 Hospital Admission She was admitted for bowel obstruction from sigmoid colon mass. She declined surgery, and had sigmoid colon stent placement. Her symptoms resolved afterwards.  INTERVAL HISTORY:  [name] [name] 64 y.o. female with a history of Stage IV colon cancer is here for follow-up.  She is doing very well overall. She denies any complaints. She has good energy level and  eating very well. She has no nausea, abdominal discomfort, pain, or other complaints. Her bowel movements is normal, no blood in stool. She is quite active at home and at church.  Past Medical History  Diagnosis Date  . Anxiety   . Colon cancer     dx'd 2014    ALLERGIES:  is allergic to codeine.  MEDICATIONS: has a current medication list which includes the following prescription(s): capecitabine, hydrocodone-acetaminophen, lidocaine-prilocaine, loperamide, lorazepam, nystatin, ondansetron, OVER THE COUNTER MEDICATION, polyethylene glycol powder, probiotic product, and prochlorperazine.  SURGICAL HISTORY:  Past Surgical History  Procedure Laterality Date  . Abdominal hysterectomy  2005  . Cesarean section  1976  . Flexible sigmoidoscopy N/A 05/25/2013    Procedure: FLEXIBLE SIGMOIDOSCOPY;  Surgeon: [name], MD;  Location: [facility];  Service: Endoscopy;  Laterality: N/A;  needs floro  . Colonic stent placement N/A 05/25/2013    Procedure: COLONIC STENT PLACEMENT;  Surgeon: [name], MD;  Location: [facility];  Service: Endoscopy;  Laterality: N/A;  . Portacath placement Right 06/25/2013    Procedure: ULTRA SOUND GUIDED INSERTION PORT-A-CATH;  Surgeon: [name], MD;  Location: [place];  Service: General;  Laterality: Right;  . Flexible sigmoidoscopy N/A 08/19/2014    Procedure: FLEXIBLE SIGMOIDOSCOPY;  Surgeon: Gatha Mayer, MD;  Location: [facility];  Service: Endoscopy;  Laterality: N/A;  . Colonic stent placement N/A 08/19/2014    Procedure: COLONIC STENT PLACEMENT;  Surgeon: Gatha Mayer, MD;  Location: [facility];  Service: Endoscopy;  Laterality: N/A;  . Flexible sigmoidoscopy N/A 08/19/2014    Procedure: FLEXIBLE SIGMOIDOSCOPY;  Surgeon: Gatha Mayer, MD;  Location: [facility];  Service: Endoscopy;  Laterality: N/A;  . Colonic stent placement N/A 08/19/2014    Procedure: COLONIC STENT PLACEMENT;  Surgeon: Gatha Mayer, MD;   Location: [facility];  Service: Endoscopy;  Laterality: N/A;    REVIEW OF SYSTEMS:   Constitutional: Denies fevers, chills or abnormal weight loss Eyes: Denies blurriness of vision Ears, nose, mouth, throat, and face: Denies mucositis or sore throat Respiratory: Denies cough, dyspnea or wheezes Cardiovascular: Denies palpitation, chest discomfort or lower extremity swelling Gastrointestinal:  Denies nausea, heartburn or change in bowel habits Skin: Denies abnormal skin rashes Lymphatics: Denies new lymphadenopathy or easy bruising Neurological:Denies numbness, tingling or new weaknesses Behavioral/Psych: Mood is stable, no new changes  All other systems were reviewed with the patient and are negative.  PHYSICAL EXAMINATION: ECOG PERFORMANCE STATUS: 0-Asymptomatic  Blood pressure 132/61, pulse 85, temperature 97.7 F (36.5 C), temperature source Oral, resp. rate 18, height '5\' 5"'  (1.651 m), weight 153 lb 6.4 oz (69.582 kg), SpO2 100 %.  GENERAL:alert, no distress and comfortable; well-developed, well nourished.  SKIN: skin color, texture, turgor are normal, no rashes or significant lesions; + R Port a cath without tenderness; hyperpigmentation of her hands.  EYES: normal, Conjunctiva are pink and non-injected, sclera clear  OROPHARYNX:no exudate, no erythema and lips, buccal mucosa  NECK: supple, thyroid normal size, non-tender, without nodularity  LYMPH: no palpable lymphadenopathy in the cervical, axillary or supraclavicular  LUNGS: clear to auscultation and percussion with normal breathing effort  HEART: Tachycardic with regular rhythm and no murmurs and no lower extremity edema  ABDOMEN:abdomen soft, non-tender and normal  bowel sounds Musculoskeletal:no cyanosis of digits and no clubbing  NEURO: alert & oriented x 3 with fluent speech, no focal motor/sensory deficits EXTREMITIES: Positive for thickening of nail bed, mild pinkish discharge from the left third nail bed, mild  tenderness, no skin erythema or swelling.  Labs:  CBC Latest Ref Rng 10/11/2014 09/13/2014 08/18/2014  WBC 3.9 - 10.3 10e3/uL 5.6 5.9 6.8  Hemoglobin 11.6 - 15.9 g/dL 10.7(L) 11.3(L) 10.5(L)  Hematocrit 34.8 - 46.6 % 33.7(L) 34.9 33.1(L)  Platelets 145 - 400 10e3/uL 261 284 317    CMP Latest Ref Rng 09/13/2014 08/20/2014 08/19/2014  Glucose 70 - 140 mg/dl 86 111(H) 136(H)  BUN 7.0 - 26.0 mg/dL 8.9 <5(L) 6  Creatinine 0.6 - 1.1 mg/dL 0.7 0.59 0.59  Sodium 136 - 145 mEq/L 140 139 135  Potassium 3.5 - 5.1 mEq/L 4.0 4.1 3.0(L)  Chloride 96 - 112 mmol/L - 109 107  CO2 22 - 29 mEq/L 21(L) 22 23  Calcium 8.4 - 10.4 mg/dL 10.8(H) 9.6 10.0  Total Protein 6.4 - 8.3 g/dL 7.6 - -  Total Bilirubin 0.20 - 1.20 mg/dL <0.20 - -  Alkaline Phos 40 - 150 U/L 66 - -  AST 5 - 34 U/L 14 - -  ALT 0 - 55 U/L 9 - -    CEA (Order [id number])      CEA  Status: Finalresult Visible to patient:  Not Released Nextappt: Today at 09:00 AM in [facility]) Dx:  Metastatic colon cancer to liver           Ref Range 4wk ago  24moago  224mogo  30m75moo     CEA 0.0 - 5.0 ng/mL 4.1 4.1 5.0 4.6           RADIOGRAPHIC STUDIES:   ASSESSMENT: CarPark Breed 80o. female with a history of stage IV colon adenocarcinoma, KRAS mutation(+), on capecitabine maintenance therapy..   1. Metastatic colorectal Cancer (mCRC) to liver, spleen, stable disease. -She had stable disease after second line of chemotherapy, now on Capecitabine maintenance. She is tolerating 1500 mg twice daily well. Her blood counts are adequate, we'll start next cycle 5th cycle next Monday  -Restaging after 6 cycles. -She has declined surgery multiple times. She has strong faith in God and prefers to maintain her current quality of life.   2. S/p migrated colorectal stent, s/p retrieval on 05/14 and replaced on 08/17/14 due to obstruction  -- She will follow up with GI/Surgery as needed.    3. Family history  of Colon cancer.  --Based on her age and family history (sister deceased at age 94 98om colon cancer), referral for genetic testing for Lynch syndrome, i.e. MSI or IHC, would be appropriate. We made a referral to genetics. Her tumor was negative for MSI high (Lynch syndrome). She was seen on 02/23 but declined further testing for genetic syndromes presently.   4. 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  -chronic elevated Ca, stable. She knows to drink more fluids, avoid dehydration -zometa 4mg41m on 07/25/13 and 06/12/2014. She had some abdominal discomfort and after Zometa infusion, and is hesitated to have more at this point. -Her calcium is 11 today. We'll continue watch.  5. Abdominal gassy and constipation -resolved after sigmoid colon stent placement. -Continue laxative and stool softener.   PLAN -I refilled her Capecitabine today and she will start next week -RTC in 3 weeks with APP and me in 6 week (5/20) -Possible Zometa infusion if  her calcium further increases today -restaging CT CAP a few days before her visit with me   All questions were answered. The patient knows to call the clinic with any problems, questions or concerns. We can certainly see the patient much sooner if necessary.  I spent 20 minutes counseling the patient face to face. The total time spent in the appointment was 25 minutes.  Truitt Merle  10/11/2014

## 2014-10-11 NOTE — Patient Instructions (Signed)

## 2014-10-11 NOTE — Telephone Encounter (Signed)
gave and printed appt sched and avs for pt for April and May....gv pt barium °

## 2014-10-12 LAB — CEA: CEA: 6.6 ng/mL — ABNORMAL HIGH (ref 0.0–5.0)

## 2014-10-15 ENCOUNTER — Telehealth: Payer: Self-pay | Admitting: *Deleted

## 2014-10-15 NOTE — Telephone Encounter (Signed)
PT. HAS NOT RECEIVED A CALL CONCERNING HER APPOINTMENT FOR HER CT SCAN. CALLED CENTRAL SCHEDULING WHO WILL CONTACT PT. ABOUT HER CT SCAN.

## 2014-10-24 ENCOUNTER — Inpatient Hospital Stay (HOSPITAL_COMMUNITY): Payer: Medicaid Other

## 2014-10-24 ENCOUNTER — Inpatient Hospital Stay (HOSPITAL_COMMUNITY)
Admission: EM | Admit: 2014-10-24 | Discharge: 2014-10-31 | DRG: 330 | Disposition: A | Discharge status: Home Health | Payer: Medicaid Other | Attending: Internal Medicine | Admitting: Internal Medicine

## 2014-10-24 ENCOUNTER — Emergency Department (HOSPITAL_COMMUNITY): Payer: Medicaid Other

## 2014-10-24 ENCOUNTER — Encounter (HOSPITAL_COMMUNITY): Payer: Self-pay | Admitting: Emergency Medicine

## 2014-10-24 DIAGNOSIS — F419 Anxiety disorder, unspecified: Secondary | ICD-10-CM

## 2014-10-24 DIAGNOSIS — C19 Malignant neoplasm of rectosigmoid junction: Principal | ICD-10-CM | POA: Diagnosis present

## 2014-10-24 DIAGNOSIS — K5669 Other intestinal obstruction: Secondary | ICD-10-CM | POA: Diagnosis present

## 2014-10-24 DIAGNOSIS — R911 Solitary pulmonary nodule: Secondary | ICD-10-CM | POA: Diagnosis present

## 2014-10-24 DIAGNOSIS — K5660 Unspecified intestinal obstruction: Secondary | ICD-10-CM | POA: Diagnosis not present

## 2014-10-24 DIAGNOSIS — Z885 Allergy status to narcotic agent status: Secondary | ICD-10-CM

## 2014-10-24 DIAGNOSIS — C787 Secondary malignant neoplasm of liver and intrahepatic bile duct: Secondary | ICD-10-CM | POA: Diagnosis present

## 2014-10-24 DIAGNOSIS — R1013 Epigastric pain: Secondary | ICD-10-CM | POA: Insufficient documentation

## 2014-10-24 DIAGNOSIS — Z803 Family history of malignant neoplasm of breast: Secondary | ICD-10-CM

## 2014-10-24 DIAGNOSIS — Z79899 Other long term (current) drug therapy: Secondary | ICD-10-CM | POA: Diagnosis not present

## 2014-10-24 DIAGNOSIS — R1114 Bilious vomiting: Secondary | ICD-10-CM | POA: Diagnosis not present

## 2014-10-24 DIAGNOSIS — K593 Megacolon, not elsewhere classified: Secondary | ICD-10-CM | POA: Diagnosis present

## 2014-10-24 DIAGNOSIS — Z87891 Personal history of nicotine dependence: Secondary | ICD-10-CM

## 2014-10-24 DIAGNOSIS — Z808 Family history of malignant neoplasm of other organs or systems: Secondary | ICD-10-CM | POA: Diagnosis not present

## 2014-10-24 DIAGNOSIS — K56609 Unspecified intestinal obstruction, unspecified as to partial versus complete obstruction: Secondary | ICD-10-CM | POA: Diagnosis present

## 2014-10-24 DIAGNOSIS — R112 Nausea with vomiting, unspecified: Secondary | ICD-10-CM | POA: Diagnosis present

## 2014-10-24 DIAGNOSIS — R1010 Upper abdominal pain, unspecified: Secondary | ICD-10-CM

## 2014-10-24 DIAGNOSIS — C7989 Secondary malignant neoplasm of other specified sites: Secondary | ICD-10-CM | POA: Diagnosis not present

## 2014-10-24 DIAGNOSIS — Z9221 Personal history of antineoplastic chemotherapy: Secondary | ICD-10-CM | POA: Diagnosis not present

## 2014-10-24 DIAGNOSIS — Z79891 Long term (current) use of opiate analgesic: Secondary | ICD-10-CM | POA: Diagnosis not present

## 2014-10-24 DIAGNOSIS — Z90710 Acquired absence of both cervix and uterus: Secondary | ICD-10-CM | POA: Diagnosis not present

## 2014-10-24 DIAGNOSIS — E876 Hypokalemia: Secondary | ICD-10-CM | POA: Diagnosis not present

## 2014-10-24 DIAGNOSIS — R1110 Vomiting, unspecified: Secondary | ICD-10-CM | POA: Diagnosis not present

## 2014-10-24 DIAGNOSIS — D6481 Anemia due to antineoplastic chemotherapy: Secondary | ICD-10-CM | POA: Diagnosis present

## 2014-10-24 DIAGNOSIS — Z800 Family history of malignant neoplasm of digestive organs: Secondary | ICD-10-CM | POA: Diagnosis not present

## 2014-10-24 LAB — CBC WITH DIFFERENTIAL/PLATELET
Basophils Absolute: 0 10*3/uL (ref 0.0–0.1)
Basophils Relative: 0 % (ref 0–1)
Eosinophils Absolute: 0.3 10*3/uL (ref 0.0–0.7)
Eosinophils Relative: 4 % (ref 0–5)
HEMATOCRIT: 35.2 % — AB (ref 36.0–46.0)
Hemoglobin: 11.4 g/dL — ABNORMAL LOW (ref 12.0–15.0)
LYMPHS PCT: 26 % (ref 12–46)
Lymphs Abs: 1.7 10*3/uL (ref 0.7–4.0)
MCH: 27.5 pg (ref 26.0–34.0)
MCHC: 32.4 g/dL (ref 30.0–36.0)
MCV: 84.8 fL (ref 78.0–100.0)
MONO ABS: 0.4 10*3/uL (ref 0.1–1.0)
Monocytes Relative: 6 % (ref 3–12)
Neutro Abs: 4.2 10*3/uL (ref 1.7–7.7)
Neutrophils Relative %: 64 % (ref 43–77)
Platelets: 288 10*3/uL (ref 150–400)
RBC: 4.15 MIL/uL (ref 3.87–5.11)
RDW: 13 % (ref 11.5–15.5)
WBC: 6.6 10*3/uL (ref 4.0–10.5)

## 2014-10-24 LAB — URINE MICROSCOPIC-ADD ON

## 2014-10-24 LAB — COMPREHENSIVE METABOLIC PANEL
ALBUMIN: 3.7 g/dL (ref 3.5–5.2)
ALT: 11 U/L (ref 0–35)
AST: 19 U/L (ref 0–37)
Alkaline Phosphatase: 72 U/L (ref 39–117)
Anion gap: 9 (ref 5–15)
BUN: 13 mg/dL (ref 6–23)
CALCIUM: 11 mg/dL — AB (ref 8.4–10.5)
CO2: 21 mmol/L (ref 19–32)
CREATININE: 0.69 mg/dL (ref 0.50–1.10)
Chloride: 108 mmol/L (ref 96–112)
GFR calc Af Amer: >90 mL/min (ref >90)
GFR calc non Af Amer: >90 mL/min (ref >90)
Glucose, Bld: 97 mg/dL (ref 70–99)
Potassium: 3.8 mmol/L (ref 3.5–5.1)
Sodium: 138 mmol/L (ref 135–145)
Total Bilirubin: 0.1 mg/dL — ABNORMAL LOW (ref 0.3–1.2)
Total Protein: 8.5 g/dL — ABNORMAL HIGH (ref 6.0–8.3)

## 2014-10-24 LAB — URINALYSIS, ROUTINE W REFLEX MICROSCOPIC
BILIRUBIN URINE: NEGATIVE
Glucose, UA: NEGATIVE mg/dL
Hgb urine dipstick: NEGATIVE
Ketones, ur: NEGATIVE mg/dL
NITRITE: NEGATIVE
Protein, ur: NEGATIVE mg/dL
SPECIFIC GRAVITY, URINE: 1.019 (ref 1.005–1.030)
UROBILINOGEN UA: 0.2 mg/dL (ref 0.0–1.0)
pH: 5.5 (ref 5.0–8.0)

## 2014-10-24 LAB — I-STAT CG4 LACTIC ACID, ED: LACTIC ACID, VENOUS: 0.65 mmol/L (ref 0.5–2.0)

## 2014-10-24 MED ORDER — HYDROMORPHONE HCL 1 MG/ML IJ SOLN
1.0000 mg | INTRAMUSCULAR | Status: DC | PRN
  Administered 2014-10-25 – 2014-10-30 (×30): 1 mg via INTRAVENOUS
  Filled 2014-10-24 (×29): qty 1

## 2014-10-24 MED ORDER — ONDANSETRON HCL 4 MG PO TABS: 4.0000 mg | ORAL_TABLET | Freq: Four times a day (QID) | ORAL | Status: DC | PRN

## 2014-10-24 MED ORDER — ONDANSETRON HCL 4 MG/2ML IJ SOLN
4.0000 mg | Freq: Four times a day (QID) | INTRAMUSCULAR | Status: DC | PRN
  Administered 2014-10-25 – 2014-10-27 (×9): 4 mg via INTRAVENOUS
  Filled 2014-10-24 (×9): qty 2

## 2014-10-24 MED ORDER — SODIUM CHLORIDE 0.9 % IV BOLUS (SEPSIS)
1000.0000 mL | Freq: Once | INTRAVENOUS | Status: AC
  Administered 2014-10-24: 1000 mL via INTRAVENOUS

## 2014-10-24 MED ORDER — PROMETHAZINE HCL 25 MG/ML IJ SOLN
12.5000 mg | Freq: Once | INTRAMUSCULAR | Status: AC
  Administered 2014-10-24: 12.5 mg via INTRAVENOUS
  Filled 2014-10-24: qty 1

## 2014-10-24 MED ORDER — DEXTROSE-NACL 5-0.45 % IV SOLN
INTRAVENOUS | Status: AC
  Administered 2014-10-24: 23:00:00 via INTRAVENOUS

## 2014-10-24 MED ORDER — HYDROMORPHONE HCL 1 MG/ML IJ SOLN
0.5000 mg | Freq: Once | INTRAMUSCULAR | Status: AC
  Administered 2014-10-24: 0.5 mg via INTRAVENOUS
  Filled 2014-10-24: qty 1

## 2014-10-24 MED ORDER — LORAZEPAM 2 MG/ML IJ SOLN: 1.0000 mg | Freq: Four times a day (QID) | INTRAMUSCULAR | Status: DC | PRN

## 2014-10-24 MED ORDER — ONDANSETRON HCL 4 MG/2ML IJ SOLN
4.0000 mg | Freq: Once | INTRAMUSCULAR | Status: AC
  Administered 2014-10-24: 4 mg via INTRAVENOUS
  Filled 2014-10-24: qty 2

## 2014-10-24 MED ORDER — MORPHINE SULFATE 4 MG/ML IJ SOLN
4.0000 mg | Freq: Once | INTRAMUSCULAR | Status: AC
  Administered 2014-10-24: 4 mg via INTRAVENOUS
  Filled 2014-10-24: qty 1

## 2014-10-24 MED ORDER — IOHEXOL 300 MG/ML  SOLN
100.0000 mL | Freq: Once | INTRAMUSCULAR | Status: AC | PRN
  Administered 2014-10-24: 100 mL via INTRAVENOUS

## 2014-10-24 NOTE — ED Notes (Signed)
Attempted to call report to 18 RN unable to take report at this time.

## 2014-10-24 NOTE — ED Notes (Signed)
Hopsitalist at bedside.

## 2014-10-24 NOTE — ED Notes (Signed)
PA Joe at bedside

## 2014-10-24 NOTE — ED Notes (Signed)
Pt transported to XRAY °

## 2014-10-24 NOTE — ED Notes (Signed)
Pt unable to keep down contrast for CT scan.

## 2014-10-24 NOTE — ED Notes (Signed)
Pt ambulated to restroom with steady gait.

## 2014-10-24 NOTE — ED Provider Notes (Signed)
CSN: [id number]     Arrival date & time 10/24/14  1402 History   First MD Initiated Contact with Patient 10/24/14 1458     Chief Complaint  Patient presents with  . Abdominal Pain     (Consider location/radiation/quality/duration/timing/severity/associated sxs/prior Treatment) HPI [name] [name] is a 64 year old female past medical history of colon cancer, treated with chemotherapy, currently taking oral medication Xeloda for therapy who presents the ER complaining of upper abdominal pain. Patient states since Monday she has noticed constipation, and increasingly worsened abdominal pain which is generalized, mostly in her upper abdomen. Reports a feeling of distention. Patient states she has been having some bowel movements, however is a very small amount. Patient reports associated nausea today without vomiting. Patient denies fever, chills, shortness of breath, chest pain, dizziness, weakness, headache, blurred vision, dysuria, hematochezia, melena.  Past Medical History  Diagnosis Date  . Anxiety   . Colon cancer     dx'd 2014   Past Surgical History  Procedure Laterality Date  . Abdominal hysterectomy  2005  . Cesarean section  1976  . Flexible sigmoidoscopy N/A 05/25/2013    Procedure: FLEXIBLE SIGMOIDOSCOPY;  Surgeon: [name], MD;  Location: [facility];  Service: Endoscopy;  Laterality: N/A;  needs floro  . Colonic stent placement N/A 05/25/2013    Procedure: COLONIC STENT PLACEMENT;  Surgeon: [name], MD;  Location: [facility];  Service: Endoscopy;  Laterality: N/A;  . Portacath placement Right 06/25/2013    Procedure: ULTRA SOUND GUIDED INSERTION PORT-A-CATH;  Surgeon: [name], MD;  Location: [place];  Service: General;  Laterality: Right;  . Flexible sigmoidoscopy N/A 08/19/2014    Procedure: FLEXIBLE SIGMOIDOSCOPY;  Surgeon: Gatha Mayer, MD;  Location: [facility];  Service: Endoscopy;  Laterality: N/A;  . Colonic stent  placement N/A 08/19/2014    Procedure: COLONIC STENT PLACEMENT;  Surgeon: Gatha Mayer, MD;  Location: [facility];  Service: Endoscopy;  Laterality: N/A;  . Flexible sigmoidoscopy N/A 08/19/2014    Procedure: FLEXIBLE SIGMOIDOSCOPY;  Surgeon: Gatha Mayer, MD;  Location: [facility];  Service: Endoscopy;  Laterality: N/A;  . Colonic stent placement N/A 08/19/2014    Procedure: COLONIC STENT PLACEMENT;  Surgeon: Gatha Mayer, MD;  Location: [facility];  Service: Endoscopy;  Laterality: N/A;   Family History  Problem Relation Age of Onset  . Colon cancer Sister 17  . Diabetes Neg Hx   . Thyroid disease Neg Hx   . Breast cancer Maternal Aunt   . Prostate cancer Maternal Uncle   . Bone cancer Maternal Grandfather   . Prostate cancer Maternal Uncle    History  Substance Use Topics  . Smoking status: Former Smoker -- 1 years    Types: Cigarettes    Quit date: 05/24/1971  . Smokeless tobacco: Never Used  . Alcohol Use: No   OB History    No data available     Review of Systems  Constitutional: Negative for fever.  HENT: Negative for trouble swallowing.   Eyes: Negative for visual disturbance.  Respiratory: Negative for shortness of breath.   Cardiovascular: Negative for chest pain.  Gastrointestinal: Positive for nausea and abdominal pain. Negative for vomiting.  Genitourinary: Negative for dysuria.  Musculoskeletal: Negative for neck pain.  Skin: Negative for rash.  Neurological: Negative for dizziness, weakness and numbness.  Psychiatric/Behavioral: Negative.       Allergies  Codeine  Home Medications   Prior to Admission medications   Medication Sig  Start Date End Date Taking? Authorizing Provider  capecitabine (XELODA) 500 MG tablet Take 3 tablets (1,500 mg total) by mouth 2 (two) times daily after a meal. 09/13/14  Yes Truitt Merle, MD  docusate sodium (COLACE) 100 MG capsule Take 200 mg by mouth daily as needed for mild constipation.   Yes Historical Provider,  MD  lidocaine-prilocaine (EMLA) cream Apply 1 application topically as needed. Apply to port site one hour before treatment and cover with plastic wrap. 07/02/13  Yes Concha Norway, MD  LORazepam (ATIVAN) 0.5 MG tablet Take 1 tablet (0.5 mg total) by mouth every 8 (eight) hours as needed (nausea/vomiting). 04/22/14  Yes Aasim Lona Kettle, MD  ondansetron (ZOFRAN) 8 MG tablet Take 8mg  by mouth every 12 hours as needed for nausea/vomiting.  Take 8 mg by mouth  30 minutes before taking chemo pills, and as needed for nausea/vomiting. 07/15/14  Yes Truitt Merle, MD  polyethylene glycol powder (GLYCOLAX/MIRALAX) powder TAKE 34 G (2 DOSES) BY MOUTH DAILY AS NEEDED FOR MILD CONSTIPATION OR MODERATE CONSTIPATION Patient taking differently: TAKE 1-2 DOSES BY MOUTH DAILY AS NEEDED FOR MILD CONSTIPATION OR MODERATE CONSTIPATION 03/03/14  Yes Concha Norway, MD  prochlorperazine (COMPAZINE) 10 MG tablet TAKE 1 TABLET (10 MG TOTAL) BY MOUTH EVERY 6 (SIX) HOURS AS NEEDED (NAUSEA OR VOMITING). 01/01/14  Yes Concha Norway, MD  HYDROcodone-acetaminophen (NORCO) 5-325 MG per tablet Take 1-2 tablets by mouth every 6 (six) hours as needed. Patient not taking: Reported on 10/24/2014 08/05/14   Veryl Speak, MD  loperamide (IMODIUM A-D) 2 MG tablet Take 2 at onset of diarrhea, then 1 every 2hrs until 12hr without a BM. May take 2 tab every 4hrs at bedtime. If diarrhea recurs repeat. Patient not taking: Reported on 08/16/2014 09/13/13   Concha Norway, MD   BP 142/72 mmHg  Pulse 84  Temp(Src) 98.6 F (37 C) (Oral)  Resp 16  SpO2 98% Physical Exam  Constitutional: She is oriented to person, place, and time. She appears well-developed and well-nourished. No distress.  HENT:  Head: Normocephalic and atraumatic.  Mouth/Throat: Oropharynx is clear and moist. No oropharyngeal exudate.  Eyes: Right eye exhibits no discharge. Left eye exhibits no discharge. No scleral icterus.  Neck: Normal range of motion.  Cardiovascular: Normal rate, regular  rhythm and normal heart sounds.   No murmur heard. Pulmonary/Chest: Effort normal and breath sounds normal. No respiratory distress.  Abdominal: Soft. Normal appearance. She exhibits distension. She exhibits no fluid wave, no ascites and no mass. There is tenderness in the epigastric area. There is no rigidity, no guarding, no tenderness at McBurney's point and negative Murphy's sign.  Mild distention noted in upper abdomen. Bowel sounds hypoactive with high-pitched sounds.  Musculoskeletal: Normal range of motion. She exhibits no edema or tenderness.  Neurological: She is alert and oriented to person, place, and time. She has normal strength. No cranial nerve deficit or sensory deficit. Coordination normal. GCS eye subscore is 4. GCS verbal subscore is 5. GCS motor subscore is 6.  Patient fully alert, answering questions appropriately in full, clear sentences. Cranial nerves II through XII grossly intact. Motor strength 5 out of 5 in all major muscle groups of upper and lower extremities. Distal sensation intact.   Skin: Skin is warm and dry. No rash noted. She is not diaphoretic.  Psychiatric: She has a normal mood and affect.  Nursing note and vitals reviewed.   ED Course  Procedures (including critical care time) Labs Review Labs Reviewed  CBC WITH DIFFERENTIAL/PLATELET -  Abnormal; Notable for the following:    Hemoglobin 11.4 (*)    HCT 35.2 (*)    All other components within normal limits  COMPREHENSIVE METABOLIC PANEL - Abnormal; Notable for the following:    Calcium 11.0 (*)    Total Protein 8.5 (*)    Total Bilirubin 0.1 (*)    All other components within normal limits  URINALYSIS, ROUTINE W REFLEX MICROSCOPIC - Abnormal; Notable for the following:    Leukocytes, UA MODERATE (*)    All other components within normal limits  URINE MICROSCOPIC-ADD ON - Abnormal; Notable for the following:    Bacteria, UA FEW (*)    All other components within normal limits  I-STAT CG4 LACTIC  ACID, ED    Imaging Review Ct Abdomen Pelvis W Contrast  10/24/2014   CLINICAL DATA:  Diffuse abdominal pain and constipation for the past 4 days. Abdominal distention. Nausea and vomiting. Colonic stent placed in February of this year. 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  EXAM: CT ABDOMEN AND PELVIS WITH CONTRAST  TECHNIQUE: Multidetector CT imaging of the abdomen and pelvis was performed using the standard protocol following bolus administration of intravenous contrast.  CONTRAST:  127mL OMNIPAQUE IOHEXOL 300 MG/ML  SOLN  COMPARISON:  Radiographs obtained earlier today. Abdomen and pelvis CTs, the most recent dated 08/17/2014. Abdomen MR dated 05/24/2013.  FINDINGS: 1.9 cm arm rounded, low density in the mass with peripheral contrast puddling in the right lobe of the liver. 7.0 cm similar appearing mass more inferiorly in the right lobe. There are also several small probable cysts in the liver, all without significant change. A 1.0 cm calcification in the superior aspect of the junction of the medial and lateral segments of the left lobe is also unchanged. This is at the location of a 3.3 cm probable metastasis seen on 05/22/2013. That mass is no longer seen.  A 5.9 x 5.1 cm oval, mildly heterogeneous mass is noted in the spleen on the left, measured on image number 23. This measured 4.5 x 4.0 cm 05/22/2013.  Dilated, stool and gas filled colon to the level of a sigmoid colon stent. There is soft tissue thickening within the lumen of the stent with a nodular appearance, causing almost complete obstruction of the lumen with no low stool seen distally.  Left para-aortic lymph node with a short axis diameter of 7 mm on image number 50 without significant change since 08/17/2014. No intervally enlarged lymph nodes.  Tiny calculus in the mid to upper left kidney. There is also a 7 mm calculus in the mid to lower left kidney.  No gastric or small  bowel dilatation. Minimal free peritoneal fluid. Normal appearing adrenal glands, pancreas and gallbladder. Normal appearing appendix. No evidence of bony metastatic disease. Lumbar and lower thoracic spine degenerative changes. Mildly prominent interstitial markings at the lung bases. Minimal dependent atelectasis at both lung bases. The previously noted left lower lobe nodule is not currently included.  IMPRESSION: 1. 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 2. Minimal free peritoneal fluid. 3. 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 4. Treated metastasis in the left lobe of the liver. 5. Previously demonstrated hemangiomas in the right  lobe of the liver and probable small liver cysts. 6. Stable small probable metastatic left para-aortic lymph node.   Electronically Signed   By: Claudie Revering M.D.   On: 10/24/2014 20:13   Dg Abd Acute W/chest  10/24/2014   CLINICAL DATA:  Intermittent abdominal pain since Tuesday. Nausea, vomiting and constipation. History of colon cancer.  EXAM: DG ABDOMEN ACUTE W/ 1V CHEST  COMPARISON:  08/19/2014; 08/05/2014; CT abdomen pelvis - 08/17/2014  FINDINGS: Grossly unchanged cardiac silhouette and mediastinal contours. No focal parenchymal opacities. No pleural effusion or pneumothorax. No evidence of edema. Stable position of support apparatus.  A presumed colonic stent overlies the left hemipelvis. There is moderate upstream gaseous distention of the colon. There is no significant gaseous distention of the small bowel. No pneumoperitoneum, pneumatosis or portal venous gas.  Several phleboliths overlie the left hemipelvis. Otherwise, no abnormal intra-abdominal calcifications.  No acute osseus abnormalities. Degenerative change  of the pubic symphysis.  IMPRESSION: A presumed colonic stent overlies the left hemipelvis. This is associated with moderate upstream distention of the more proximal colon without definitive distention of the small bowel. While potentially representative an ileus, on early distal colonic obstruction could have a similar appearance. Clinical correlation is advised.   Electronically Signed   By: Sandi Mariscal M.D.   On: 10/24/2014 16:27     EKG Interpretation None      MDM   Final diagnoses:  Upper abdominal pain    Patient here with signs and symptoms consistent concerning for bowel obstruction given her history of stage IV colon cancer and stent placement. On exam patient has mild distention of her upper abdomen. Patient treated symptomatically, image with CT.  CT abdomen pelvis with impression: 1. 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 2. Minimal free peritoneal fluid. 3. 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 4. Treated metastasis in the left lobe of the liver. 5. Previously demonstrated hemangiomas in the right lobe of the liver and probable small liver cysts. 6. Stable small probable metastatic left para-aortic lymph node.  [facility] hospitalists who agreed to admit patient to [place] unit. Spoke with Dr. Ronnald Ramp with general surgery in consultation patient's case. Dr. Ronnald Ramp recommends at this point to continue having patient admitted to medicine, general surgery will follow-up with her in the morning. Dr. Ronnald Ramp recommends that this point given patient states her cancer as well as her presentation there is no indication for emergent surgical intervention, and they will follow up with her tomorrow. Dr. Ronnald Ramp asked internal medicine  to call him if there is any acute change of patient's case. Patient admitted under [facility] hospitalist to [place]. The patient appears reasonably stabilized for admission considering the current resources, flow, and capabilities available in the ED at this time, and I doubt any other [facility] requiring further screening and/or treatment in the ED prior to admission.  BP 142/72 mmHg  Pulse 84  Temp(Src) 98.6 F (37 C) (Oral)  Resp 16  SpO2 98%  Signed,  Dahlia Bailiff, PA-C 9:22 PM  Patient discussed with Dr. Quintella Reichert, MD   Dahlia Bailiff, PA-C 10/24/14 2122  Quintella Reichert, MD 10/24/14 2250

## 2014-10-24 NOTE — ED Notes (Signed)
Pt c/o gen abd pain and constipation since Monday.  Last normal BM was on Monday. States that she is having BMs but they are hard.  States that she feels that her abd is distended.

## 2014-10-24 NOTE — H&P (Signed)
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  Over the last 3 days, she has had progressive worsening upper abd pain with increased distention and nausea, vomiting bilious material.  Minimal flatus.  No fevers.  Ct scan today shows tumor growth into/surrounding the stent causing obstruction.  She was referred for admission for bowel obstruction.  Review of Systems:  Positive and negative as per HPI otherwise all other systems are negative  Past Medical History: Past Medical History  Diagnosis Date  . Anxiety   . Colon cancer     dx'd 2014   Past Surgical History  Procedure Laterality Date  . Abdominal hysterectomy  2005  . Cesarean section  1976  . Flexible sigmoidoscopy N/A 05/25/2013    Procedure: FLEXIBLE SIGMOIDOSCOPY;  Surgeon: [name], MD;  Location: [facility];  Service: Endoscopy;  Laterality: N/A;  needs floro  . Colonic stent placement N/A 05/25/2013    Procedure: COLONIC STENT PLACEMENT;  Surgeon: [name], MD;  Location: [facility];  Service: Endoscopy;  Laterality: N/A;  . Portacath placement Right 06/25/2013    Procedure: ULTRA SOUND GUIDED INSERTION PORT-A-CATH;  Surgeon: [name], MD;  Location: [facility];  Service: General;  Laterality: Right;  . Flexible sigmoidoscopy N/A 08/19/2014    Procedure: FLEXIBLE SIGMOIDOSCOPY;  Surgeon: Gatha Mayer, MD;  Location: [facility];  Service: Endoscopy;  Laterality: N/A;  . Colonic stent placement N/A 08/19/2014    Procedure: COLONIC STENT PLACEMENT;  Surgeon: Gatha Mayer, MD;  Location: [facility];  Service: Endoscopy;  Laterality: N/A;  . Flexible sigmoidoscopy N/A 08/19/2014    Procedure: FLEXIBLE SIGMOIDOSCOPY;   Surgeon: Gatha Mayer, MD;  Location: [facility];  Service: Endoscopy;  Laterality: N/A;  . Colonic stent placement N/A 08/19/2014    Procedure: COLONIC STENT PLACEMENT;  Surgeon: Gatha Mayer, MD;  Location: [facility];  Service: Endoscopy;  Laterality: N/A;    Medications: Prior to Admission medications   Medication Sig Start Date End Date Taking? Authorizing Provider  capecitabine (XELODA) 500 MG tablet Take 3 tablets (1,500 mg total) by mouth 2 (two) times daily after a meal. 09/13/14  Yes Truitt Merle, MD  docusate sodium (COLACE) 100 MG capsule Take 200 mg by mouth daily as needed for mild constipation.   Yes Historical Provider, MD  lidocaine-prilocaine (EMLA) cream Apply 1 application topically as needed. Apply to port site one hour before treatment and cover with plastic wrap. 07/02/13  Yes Concha Norway, MD  LORazepam (ATIVAN) 0.5 MG tablet Take 1 tablet (0.5 mg total) by mouth every 8 (eight) hours as needed (nausea/vomiting). 04/22/14  Yes Aasim Lona Kettle, MD  ondansetron (ZOFRAN) 8 MG tablet Take 8mg  by mouth every 12 hours as needed for nausea/vomiting.  Take 8 mg by mouth  30 minutes before taking chemo pills, and as needed for nausea/vomiting. 07/15/14  Yes Truitt Merle, MD  polyethylene glycol powder (GLYCOLAX/MIRALAX) powder TAKE 34 G (2 DOSES) BY MOUTH DAILY AS NEEDED FOR MILD CONSTIPATION OR MODERATE CONSTIPATION Patient taking differently: TAKE 1-2 DOSES BY MOUTH DAILY AS NEEDED FOR MILD CONSTIPATION OR MODERATE CONSTIPATION 03/03/14  Yes Concha Norway, MD  prochlorperazine (COMPAZINE) 10 MG tablet TAKE 1 TABLET (10 MG TOTAL) BY  MOUTH EVERY 6 (SIX) HOURS AS NEEDED (NAUSEA OR VOMITING). 01/01/14  Yes Concha Norway, MD  HYDROcodone-acetaminophen (NORCO) 5-325 MG per tablet Take 1-2 tablets by mouth every 6 (six) hours as needed. Patient not taking: Reported on 10/24/2014 08/05/14   Veryl Speak, MD  loperamide (IMODIUM A-D) 2 MG tablet Take 2 at onset of diarrhea, then 1 every 2hrs until 12hr  without a BM. May take 2 tab every 4hrs at bedtime. If diarrhea recurs repeat. Patient not taking: Reported on 08/16/2014 09/13/13   Concha Norway, MD    Allergies:   Allergies  Allergen Reactions  . Codeine Itching and Nausea And Vomiting    Social History:  reports that she quit smoking about 43 years ago. Her smoking use included Cigarettes. She quit after 1 year of use. She has never used smokeless tobacco. She reports that she does not drink alcohol or use illicit drugs.  Family History: Family History  Problem Relation Age of Onset  . Colon cancer Sister 69  . Diabetes Neg Hx   . Thyroid disease Neg Hx   . Breast cancer Maternal Aunt   . Prostate cancer Maternal Uncle   . Bone cancer Maternal Grandfather   . Prostate cancer Maternal Uncle     Physical Exam: Filed Vitals:   10/24/14 1409 10/24/14 1641 10/24/14 1844 10/24/14 1925  BP: 127/79 129/58 115/47   Pulse: 99 81 86 81  Temp: 98.6 F (37 C)     TempSrc: Oral     Resp: 18 14 14    SpO2: 100% 100% 97% 96%   General appearance: alert, cooperative and no distress Head: Normocephalic, without obvious abnormality, atraumatic Eyes: negative Nose: Nares normal. Septum midline. Mucosa normal. No drainage or sinus tenderness. Neck: no JVD and supple, symmetrical, trachea midline Lungs: clear to auscultation bilaterally Heart: regular rate and rhythm, S1, S2 normal, no murmur, click, rub or gallop Abdomen: soft, non-tender; bowel sounds normal; no masses,  no organomegalyn  Minimal distention, no r/g nonacute exam Extremities: extremities normal, atraumatic, no cyanosis or edema Pulses: 2+ and symmetric Skin: Skin color, texture, turgor normal. No rashes or lesions Neurologic: Grossly normal    Labs on Admission:   Recent Labs  10/24/14 1444  NA 138  K 3.8  CL 108  CO2 21  GLUCOSE 97  BUN 13  CREATININE 0.69  CALCIUM 11.0*    Recent Labs  10/24/14 1444  AST 19  ALT 11  ALKPHOS 72  BILITOT 0.1*   PROT 8.5*  ALBUMIN 3.7    Recent Labs  10/24/14 1444  WBC 6.6  NEUTROABS 4.2  HGB 11.4*  HCT 35.2*  MCV 84.8  PLT 288   Radiological Exams on Admission: Ct Abdomen Pelvis W Contrast  10/24/2014   CLINICAL DATA:  Diffuse abdominal pain and constipation for the past 4 days. Abdominal distention. Nausea and vomiting. Colonic stent placed in February of this year. 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  EXAM: CT ABDOMEN AND PELVIS WITH CONTRAST  TECHNIQUE: Multidetector CT imaging of the abdomen and pelvis was performed using the standard protocol following bolus administration of intravenous contrast.  CONTRAST:  141mL OMNIPAQUE IOHEXOL 300 MG/ML  SOLN  COMPARISON:  Radiographs obtained earlier today. Abdomen and pelvis CTs, the most recent dated 08/17/2014. Abdomen MR dated 05/24/2013.  FINDINGS: 1.9 cm arm rounded, low density in the mass with peripheral contrast puddling in the right lobe of the liver. 7.0 cm similar appearing  mass more inferiorly in the right lobe. There are also several small probable cysts in the liver, all without significant change. A 1.0 cm calcification in the superior aspect of the junction of the medial and lateral segments of the left lobe is also unchanged. This is at the location of a 3.3 cm probable metastasis seen on 05/22/2013. That mass is no longer seen.  A 5.9 x 5.1 cm oval, mildly heterogeneous mass is noted in the spleen on the left, measured on image number 23. This measured 4.5 x 4.0 cm 05/22/2013.  Dilated, stool and gas filled colon to the level of a sigmoid colon stent. There is soft tissue thickening within the lumen of the stent with a nodular appearance, causing almost complete obstruction of the lumen with no low stool seen distally.  Left para-aortic lymph node with a short axis diameter of 7 mm on image number 50 without significant change since 08/17/2014. No intervally enlarged lymph  nodes.  Tiny calculus in the mid to upper left kidney. There is also a 7 mm calculus in the mid to lower left kidney.  No gastric or small bowel dilatation. Minimal free peritoneal fluid. Normal appearing adrenal glands, pancreas and gallbladder. Normal appearing appendix. No evidence of bony metastatic disease. Lumbar and lower thoracic spine degenerative changes. Mildly prominent interstitial markings at the lung bases. Minimal dependent atelectasis at both lung bases. The previously noted left lower lobe nodule is not currently included.  IMPRESSION: 1. 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 2. Minimal free peritoneal fluid. 3. 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 4. Treated metastasis in the left lobe of the liver. 5. Previously demonstrated hemangiomas in the right lobe of the liver and probable small liver cysts. 6. Stable small probable metastatic left para-aortic lymph node.   Electronically Signed   By: Claudie Revering M.D.   On: 10/24/2014 20:13   Dg Abd Acute W/chest  10/24/2014   CLINICAL DATA:  Intermittent abdominal pain since Tuesday. Nausea, vomiting and constipation. History of colon cancer.  EXAM: DG ABDOMEN ACUTE W/ 1V CHEST  COMPARISON:  08/19/2014; 08/05/2014; CT abdomen pelvis - 08/17/2014  FINDINGS: Grossly unchanged cardiac silhouette and mediastinal contours. No focal parenchymal opacities. No pleural effusion or pneumothorax. No evidence of edema. Stable position of support apparatus.  A presumed colonic stent overlies the left hemipelvis. There is moderate upstream gaseous distention of the colon. There is no significant gaseous distention of the small bowel. No pneumoperitoneum, pneumatosis or portal venous gas.  Several  phleboliths overlie the left hemipelvis. Otherwise, no abnormal intra-abdominal calcifications.  No acute osseus abnormalities. Degenerative change of the pubic symphysis.  IMPRESSION: A presumed colonic stent overlies the left hemipelvis. This is associated with moderate upstream distention of the more proximal colon without definitive distention of the small bowel. While potentially representative an ileus, on early distal colonic obstruction could have a similar appearance. Clinical correlation is advised.   Electronically Signed   By: Sandi Mariscal M.D.   On: 10/24/2014 16:27    Assessment/Plan  64 yo female with h/o stage 4 rectosigmoid colon cancer now with unfortunately extention of tumor now into her colonic stent recently placed in 2/16 who has refused surgical intervention up until this  point  Principal Problem:   Colon obstruction-  Place ngt.  Iv pain meds prn.  Hold anticoagulants at this time until she speaks to surgery about her options at this point.  Requested mr mintz to call General surgery for consultation.  No evidence of perforation at this time.  Await surgery recommendations.  Active Problems:   Colorectal cancer, stage IV- noted   Nausea with vomiting-  Prn zofran   Anxiety-  Prn ativan  Admit to medical bed.  She wishes to be a full code.  Expected LOS unpredictable at this time.  Carlito Bogert A 10/24/2014, 8:48 PM

## 2014-10-24 NOTE — ED Notes (Signed)
Pt and family updated on status of CT.

## 2014-10-24 NOTE — ED Notes (Signed)
Pt in CT.

## 2014-10-24 NOTE — ED Notes (Signed)
Pt returned from CT °

## 2014-10-25 LAB — BASIC METABOLIC PANEL
ANION GAP: 6 (ref 5–15)
BUN: 10 mg/dL (ref 6–23)
CO2: 23 mmol/L (ref 19–32)
CREATININE: 0.62 mg/dL (ref 0.50–1.10)
Calcium: 9.8 mg/dL (ref 8.4–10.5)
Chloride: 107 mmol/L (ref 96–112)
GFR calc non Af Amer: >90 mL/min (ref >90)
Glucose, Bld: 125 mg/dL — ABNORMAL HIGH (ref 70–99)
Potassium: 3.2 mmol/L — ABNORMAL LOW (ref 3.5–5.1)
Sodium: 136 mmol/L (ref 135–145)

## 2014-10-25 LAB — CBC
HCT: 33.8 % — ABNORMAL LOW (ref 36.0–46.0)
Hemoglobin: 10.8 g/dL — ABNORMAL LOW (ref 12.0–15.0)
MCH: 27.5 pg (ref 26.0–34.0)
MCHC: 32 g/dL (ref 30.0–36.0)
MCV: 86 fL (ref 78.0–100.0)
PLATELETS: 280 10*3/uL (ref 150–400)
RBC: 3.93 MIL/uL (ref 3.87–5.11)
RDW: 13.1 % (ref 11.5–15.5)
WBC: 6.1 10*3/uL (ref 4.0–10.5)

## 2014-10-25 MED ORDER — DEXTROSE-NACL 5-0.45 % IV SOLN
INTRAVENOUS | Status: DC
Start: 2014-10-25 — End: 2014-10-27
  Administered 2014-10-25 – 2014-10-26 (×3): via INTRAVENOUS
  Filled 2014-10-25 (×4): qty 1000

## 2014-10-25 MED ORDER — CHLORHEXIDINE GLUCONATE 0.12 % MT SOLN
15.0000 mL | Freq: Two times a day (BID) | OROMUCOSAL | Status: DC
  Administered 2014-10-25 – 2014-10-31 (×10): 15 mL via OROMUCOSAL
  Filled 2014-10-25 (×15): qty 15

## 2014-10-25 MED ORDER — CETYLPYRIDINIUM CHLORIDE 0.05 % MT LIQD
7.0000 mL | Freq: Two times a day (BID) | OROMUCOSAL | Status: DC
  Administered 2014-10-25 – 2014-10-30 (×9): 7 mL via OROMUCOSAL

## 2014-10-25 MED ORDER — POTASSIUM CHLORIDE 10 MEQ/100ML IV SOLN
10.0000 meq | INTRAVENOUS | Status: AC
  Administered 2014-10-25 (×4): 10 meq via INTRAVENOUS
  Filled 2014-10-25 (×4): qty 100

## 2014-10-25 NOTE — Progress Notes (Addendum)
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 Over the last 3 days, she has had progressive worsening upper abd pain with increased distention and nausea, vomiting bilious material. Minimal flatus. No fevers. Ct scan today shows tumor growth into/surrounding the stent causing obstruction. She was referred for admission for bowel obstruction.  Subjective / 24 H Interval events - she is feeling well this morning, is thirsty - endorses abdominal bloating - denies abdominal pain, no nausea but has NG tube in place - denies chest pain, dyspnea  Assessment/Plan: Principal Problem:   Colon obstruction Active Problems:   Colorectal cancer, stage IV   Nausea with vomiting   Bowel obstruction   Anxiety  Stage IV colorectal cancer with recurrent colonic obstruction - she is s/p stenting x 2 by GI most recently February 2016. - she refused surgery in the past - CT scan on admission showed that the sigmoid malignancy has grown into the lumen of the stent cause almost complete obstruction with upstream colonic dilatation. No perforation was appreciated. Imaging personally reviewed.  - followed by Dr. Burr Medico from oncology - discussed with Oncology, Surgery and GI, consulted, appreciate input   Nausea/vomiting  - due to #1, NG tube in place, supportive treatment  Hypokalemia - due to GI losses, replete IV - recheck BMP in am    Diet:   NPO Fluids: D5 1/2 NS with 20 mEq K DVT Prophylaxis: SCD  Code Status: Full Code Family Communication: d/w daughter bedside  Disposition Plan: remain inpatient   Consultants:  GI  Oncology  General Surgery   Procedures:  None     Antibiotics  Anti-infectives    None       Studies  1. Dg Abd 1 View 10/24/2014 NG tube tip projects over the stomach.  Distention of colon to the level of the sigmoid stent, in keeping with delayed transit / near complete obstruction. 2. Ct Abdomen Pelvis W Contrast 10/24/2014 1. 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 2. Minimal free peritoneal fluid. 3. 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 4. Treated metastasis in the left lobe of the liver. 5. Previously demonstrated hemangiomas in the right lobe of the liver and probable small liver cysts. 6. Stable small probable metastatic left para-aortic lymph node.   3. Dg Abd Acute W/chest 10/24/2014 A presumed colonic stent overlies the left hemipelvis. This is associated with moderate upstream distention of the more proximal colon without definitive distention of the small bowel. While potentially representative an ileus, on early distal colonic obstruction could have a similar appearance. Clinical correlation is advised.  Objective  Filed Vitals:   10/24/14 1925 10/24/14 2051 10/24/14 2300 10/25/14 0543  BP:  142/72 137/60 134/82  Pulse: 81 84 88 92  Temp:   97.8 F (36.6 C) 98.4 F (36.9 C)  TempSrc:   Oral Oral  Resp:  16 18 16   Weight:   69.5 kg (153 lb 3.5 oz) 69.5 kg (153 lb 3.5 oz)  SpO2: 96% 98% 100% 98%    Intake/Output Summary (Last 24 hours) at 10/25/14 1116 Last data filed at 10/25/14 0935  Gross per 24 hour  Intake      0 ml  Output    350 ml  Net   -350 ml   Filed Weights   10/24/14 2300 10/25/14 0543  Weight: 69.5 kg (153 lb 3.5 oz) 69.5 kg (153 lb 3.5 oz)    Exam:  General:  NAD, NG tube in place  HEENT: no scleral icterus, PERRL  Cardiovascular:  RRR without MRG, 2+ peripheral pulses, no edema  Respiratory: CTA biL, good air movement, no wheezing, no crackles, no rales  Abdomen: soft, mild tenderness to palpation, mild distention   MSK/Extremities: no clubbing/cyanosis, no joint swelling  Skin: no rashes  Neuro: non focal  Data Reviewed: Basic Metabolic Panel:  Recent Labs Lab 10/24/14 1444 10/25/14 0555  NA 138 136  K 3.8 3.2*  CL 108 107  CO2 21 23  GLUCOSE 97 125*  BUN 13 10  CREATININE 0.69 0.62  CALCIUM 11.0* 9.8   Liver Function Tests:  Recent Labs Lab 10/24/14 1444  AST 19  ALT 11  ALKPHOS 72  BILITOT 0.1*  PROT 8.5*  ALBUMIN 3.7   CBC:  Recent Labs Lab 10/24/14 1444 10/25/14 0555  WBC 6.6 6.1  NEUTROABS 4.2  --   HGB 11.4* 10.8*  HCT 35.2* 33.8*  MCV 84.8 86.0  PLT 288 280    Scheduled Meds: . antiseptic oral rinse  7 mL Mouth Rinse q12n4p  . chlorhexidine  15 mL Mouth Rinse BID  . potassium chloride  10 mEq Intravenous Q1 Hr x 4   Continuous Infusions:   Time spent: 25 minutes coordinating care, patient evaluation, family updates, reviewing prior outpatient and GI notes, personally reviewing images and discussing with consultants.  Marzetta Board, MD [facility] Pager [phone]. If 7 PM - 7 AM, please contact night-coverage at [email or web address], password [facility] 10/25/2014, 11:16 AM  LOS: 1 day

## 2014-10-25 NOTE — Consult Note (Signed)
Referring Provider:[facility] Primary Care Physician:  [place] PA Primary Gastroenterologist:  Dr. Ardis Hughs  Reason for Consultation:        HPI: [name] [name] is a 64 y.o. female who was diagnosed with stage IV colon cancer and fall of 2014. She had a colonic stent placed in November 2014 to relieve an obstructing rectal mass. As the tumor request the stent dislodged protruding into the anus and was manually retrieved by Dr. Ardis Hughs in May 2015. She had chemo with FOLFOX and FOLFIRI and mets to liver, spleen had regressed. On February 1 of 2016 she presented to the emergency room with abdominal pain and constipation which began after a change in her chemotherapeutic agent to Xeloda. Abdominal films showed moderate stool burden and a few air-fluid levels with prominent gas but no obstruction. She was sent home and managed to have occasional smaller than normal nonbloody bowel movements. Her belly became distended and tense and she presented to the [facility] long emergency room in mid February when CT of the abdomen showed some focal narrowing at the distal sigmoid and upstream from this region was a large amount of stool and a dilated colon. She was also noted to have wall thickening in the descending colon and rectum. Patient opted to have another stent rather than diverging colostomy and on February 15 had a 6 cm self-expanding noncovered stent placed. She was discharged home and states she was doing fine until this past Tuesday when she began to become constipated. She developed nausea, distention and started to have some loose stools. She states her last bowel movement was very early in the morning of the 21st. Repeat CT scan showed distal sigmoid colon malignancy which is grown into the lumen of the stent causing almost complete obstruction of the lumen with associated dilation of the colon proximal to the stent with a significant amount of stool and gas in the colon stent. Slow growth of  probable metastasis in the spleen. Treated metastasis in the left lobe of the liver. Stable small probable metastatic left para-aortic lymph node.   Past Medical History  Diagnosis Date  . Anxiety   . Colon cancer     dx'd 2014    Past Surgical History  Procedure Laterality Date  . Abdominal hysterectomy  2005  . Cesarean section  1976  . Flexible sigmoidoscopy N/A 05/25/2013    Procedure: FLEXIBLE SIGMOIDOSCOPY;  Surgeon: [name], MD;  Location: [facility];  Service: Endoscopy;  Laterality: N/A;  needs floro  . Colonic stent placement N/A 05/25/2013    Procedure: COLONIC STENT PLACEMENT;  Surgeon: [name], MD;  Location: [facility];  Service: Endoscopy;  Laterality: N/A;  . Portacath placement Right 06/25/2013    Procedure: ULTRA SOUND GUIDED INSERTION PORT-A-CATH;  Surgeon: [name], MD;  Location: [place];  Service: General;  Laterality: Right;  . Flexible sigmoidoscopy N/A 08/19/2014    Procedure: FLEXIBLE SIGMOIDOSCOPY;  Surgeon: Gatha Mayer, MD;  Location: [facility];  Service: Endoscopy;  Laterality: N/A;  . Colonic stent placement N/A 08/19/2014    Procedure: COLONIC STENT PLACEMENT;  Surgeon: Gatha Mayer, MD;  Location: [facility];  Service: Endoscopy;  Laterality: N/A;  . Flexible sigmoidoscopy N/A 08/19/2014    Procedure: FLEXIBLE SIGMOIDOSCOPY;  Surgeon: Gatha Mayer, MD;  Location: [facility];  Service: Endoscopy;  Laterality: N/A;  . Colonic stent placement N/A 08/19/2014    Procedure: COLONIC STENT PLACEMENT;  Surgeon: Gatha Mayer,  MD;  Location: [facility];  Service: Endoscopy;  Laterality: N/A;    Prior to Admission medications   Medication Sig Start Date End Date Taking? Authorizing Provider  capecitabine (XELODA) 500 MG tablet Take 3 tablets (1,500 mg total) by mouth 2 (two) times daily after a meal. 09/13/14  Yes Truitt Merle, MD  docusate sodium (COLACE) 100 MG capsule Take 200 mg by mouth daily as needed  for mild constipation.   Yes Historical Provider, MD  lidocaine-prilocaine (EMLA) cream Apply 1 application topically as needed. Apply to port site one hour before treatment and cover with plastic wrap. 07/02/13  Yes Concha Norway, MD  LORazepam (ATIVAN) 0.5 MG tablet Take 1 tablet (0.5 mg total) by mouth every 8 (eight) hours as needed (nausea/vomiting). 04/22/14  Yes Aasim Lona Kettle, MD  ondansetron (ZOFRAN) 8 MG tablet Take 8mg  by mouth every 12 hours as needed for nausea/vomiting.  Take 8 mg by mouth  30 minutes before taking chemo pills, and as needed for nausea/vomiting. 07/15/14  Yes Truitt Merle, MD  polyethylene glycol powder (GLYCOLAX/MIRALAX) powder TAKE 34 G (2 DOSES) BY MOUTH DAILY AS NEEDED FOR MILD CONSTIPATION OR MODERATE CONSTIPATION Patient taking differently: TAKE 1-2 DOSES BY MOUTH DAILY AS NEEDED FOR MILD CONSTIPATION OR MODERATE CONSTIPATION 03/03/14  Yes Concha Norway, MD  prochlorperazine (COMPAZINE) 10 MG tablet TAKE 1 TABLET (10 MG TOTAL) BY MOUTH EVERY 6 (SIX) HOURS AS NEEDED (NAUSEA OR VOMITING). 01/01/14  Yes Concha Norway, MD  HYDROcodone-acetaminophen (NORCO) 5-325 MG per tablet Take 1-2 tablets by mouth every 6 (six) hours as needed. Patient not taking: Reported on 10/24/2014 08/05/14   Veryl Speak, MD  loperamide (IMODIUM A-D) 2 MG tablet Take 2 at onset of diarrhea, then 1 every 2hrs until 12hr without a BM. May take 2 tab every 4hrs at bedtime. If diarrhea recurs repeat. Patient not taking: Reported on 08/16/2014 09/13/13   Concha Norway, MD    Current Facility-Administered Medications  Medication Dose Route Frequency Provider Last Rate Last Dose  . antiseptic oral rinse (CPC / CETYLPYRIDINIUM CHLORIDE 0.05%) solution 7 mL  7 mL Mouth Rinse q12n4p Phillips Grout, MD   7 mL at 10/25/14 1200  . chlorhexidine (PERIDEX) 0.12 % solution 15 mL  15 mL Mouth Rinse BID Phillips Grout, MD   15 mL at 10/25/14 0743  . dextrose 5 % and 0.45% NaCl 1,000 mL with potassium chloride 20 mEq infusion    Intravenous Continuous Caren Griffins, MD 75 mL/hr at 10/25/14 1205    . HYDROmorphone (DILAUDID) injection 1 mg  1 mg Intravenous Q2H PRN Phillips Grout, MD   1 mg at 10/25/14 0816  . LORazepam (ATIVAN) injection 1 mg  1 mg Intravenous Q6H PRN Phillips Grout, MD      . ondansetron [facility]) tablet 4 mg  4 mg Oral Q6H PRN Phillips Grout, MD       Or  . ondansetron (ZOFRAN) injection 4 mg  4 mg Intravenous Q6H PRN Phillips Grout, MD   4 mg at 10/25/14 0200  . potassium chloride 10 mEq in 100 mL IVPB  10 mEq Intravenous Q1 Hr x 4 Costin Karlyne Greenspan, MD   10 mEq at 10/25/14 1203    Allergies as of 10/24/2014 - Review Complete 10/24/2014  Allergen Reaction Noted  . Codeine Itching and Nausea And Vomiting 05/22/2013    Family History  Problem Relation Age of Onset  . Colon cancer Sister 32  . Diabetes Neg Hx   .  Thyroid disease Neg Hx   . Breast cancer Maternal Aunt   . Prostate cancer Maternal Uncle   . Bone cancer Maternal Grandfather   . Prostate cancer Maternal Uncle     History   Social History  . Marital Status: Married    Spouse Name: N/A  . Number of Children: 3  . Years of Education: N/A   Occupational History  .     Social History Main Topics  . Smoking status: Former Smoker -- 1 years    Types: Cigarettes    Quit date: 05/24/1971  . Smokeless tobacco: Never Used  . Alcohol Use: No  . Drug Use: No  . Sexual Activity: Not on file   Other Topics Concern  . Not on file   Social History Narrative    Review of Systems: Gen:Has been feeling fatigued. CV: Denies chest pain, angina, palpitations, syncope, orthopnea, PND, peripheral edema, and claudication. Resp: Denies dyspnea at rest, dyspnea with exercise, cough, sputum, wheezing, coughing up blood, and pleurisy. GI: Had constipation followed by loose stools, has nausea and abdominal distention GU : Denies urinary burning, blood in urine, urinary frequency, urinary hesitancy, nocturnal urination, and urinary  incontinence. MS: Denies joint pain, limitation of movement, and swelling, stiffness, low back pain, extremity pain. Denies muscle weakness, cramps, atrophy.  Derm: Denies rash, itching, dry skin, hives, moles, warts, or unhealing ulcers.  Psych: Denies depression, anxiety, memory loss, suicidal ideation, hallucinations, paranoia, and confusion. Heme: Denies bruising, bleeding, and enlarged lymph nodes. Neuro:  Denies any headaches, dizziness, paresthesias. Endo:  Denies any problems with DM, thyroid, adrenal function.  Physical Exam: Vital signs in last 24 hours: Temp:  [97.8 F (36.6 C)-98.6 F (37 C)] 98.4 F (36.9 C) (04/22 0543) Pulse Rate:  [81-99] 92 (04/22 0543) Resp:  [14-18] 16 (04/22 0543) BP: (115-142)/(47-82) 134/82 mmHg (04/22 0543) SpO2:  [96 %-100 %] 98 % (04/22 0543) Weight:  [153 lb 3.5 oz (69.5 kg)] 153 lb 3.5 oz (69.5 kg) (04/22 0543) Last BM Date: 10/24/14 General:   Alert,  Well-developed, well-nourished, pleasant and cooperative in NAD Head:  Normocephalic and atraumatic. Eyes:  Sclera clear, no icterus. Conjunctiva pink. Ears:  Normal auditory acuity. Nose:  No deformity, discharge,  or lesions. Mouth:  No deformity or lesions.   Neck:  Supple; no masses or thyromegaly. Lungs:  Clear throughout to auscultation.   No wheezes, crackles, or rhonchi. Heart:  Regular rate and rhythm; no murmurs, clicks, rubs,  or gallops. Abdomen:  Soft,nontender, BS hyperactive,nonpalp mass or hsm.  Mild distention. No rebound. No guarding. Rectal:  Deferred  Msk:  Symmetrical without gross deformities. . Pulses:  Normal pulses noted. Extremities: Without clubbing or edema. Neurologic: Alert and  oriented x4;  grossly normal neurologically. Skin: Intact without significant lesions or rashes.. Psych:Alert and cooperative. Normal mood and affect.  Intake/Output from previous day: 04/21 0701 - 04/22 0700 In: -  Out: 350 [Emesis/NG output:350] Intake/Output this shift:     Lab Results:  Recent Labs  10/24/14 1444 10/25/14 0555  WBC 6.6 6.1  HGB 11.4* 10.8*  HCT 35.2* 33.8*  PLT 288 280   BMET  Recent Labs  10/24/14 1444 10/25/14 0555  NA 138 136  K 3.8 3.2*  CL 108 107  CO2 21 23  GLUCOSE 97 125*  BUN 13 10  CREATININE 0.69 0.62  CALCIUM 11.0* 9.8   LFT  Recent Labs  10/24/14 1444  PROT 8.5*  ALBUMIN 3.7  AST 19  ALT  11  ALKPHOS 72  BILITOT 0.1*    Studies/Results: Dg Abd 1 View  10/24/2014   CLINICAL DATA:  NG tube placement  EXAM: ABDOMEN - 1 VIEW  COMPARISON:  10/24/2014 CT  FINDINGS: The NG tube tip projects over the gastric fundus. A central venous catheter tip projects over the cavoatrial junction. A bowel stent projects over the region of the rectosigmoid colon. There is gaseous distention of proximal colon, in keeping with delayed transit. Previously ingested contrast opacifies loops of small bowel within the pelvis. Small bowel loops are of normal caliber. No acute osseous finding.  IMPRESSION: NG tube tip projects over the stomach.  Distention of colon to the level of the sigmoid stent, in keeping with delayed transit / near complete obstruction.   Electronically Signed   By: Carlos Levering M.D.   On: 10/24/2014 22:06   Ct Abdomen Pelvis W Contrast  10/24/2014   CLINICAL DATA:  Diffuse abdominal pain and constipation for the past 4 days. Abdominal distention. Nausea and vomiting. Colonic stent placed in February of this year. 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  EXAM: CT ABDOMEN AND PELVIS WITH CONTRAST  TECHNIQUE: Multidetector CT imaging of the abdomen and pelvis was performed using the standard protocol following bolus administration of intravenous contrast.  CONTRAST:  168mL OMNIPAQUE IOHEXOL 300 MG/ML  SOLN  COMPARISON:  Radiographs obtained earlier today. Abdomen and pelvis CTs, the most recent dated 08/17/2014. Abdomen MR dated 05/24/2013.  FINDINGS: 1.9  cm arm rounded, low density in the mass with peripheral contrast puddling in the right lobe of the liver. 7.0 cm similar appearing mass more inferiorly in the right lobe. There are also several small probable cysts in the liver, all without significant change. A 1.0 cm calcification in the superior aspect of the junction of the medial and lateral segments of the left lobe is also unchanged. This is at the location of a 3.3 cm probable metastasis seen on 05/22/2013. That mass is no longer seen.  A 5.9 x 5.1 cm oval, mildly heterogeneous mass is noted in the spleen on the left, measured on image number 23. This measured 4.5 x 4.0 cm 05/22/2013.  Dilated, stool and gas filled colon to the level of a sigmoid colon stent. There is soft tissue thickening within the lumen of the stent with a nodular appearance, causing almost complete obstruction of the lumen with no low stool seen distally.  Left para-aortic lymph node with a short axis diameter of 7 mm on image number 50 without significant change since 08/17/2014. No intervally enlarged lymph nodes.  Tiny calculus in the mid to upper left kidney. There is also a 7 mm calculus in the mid to lower left kidney.  No gastric or small bowel dilatation. Minimal free peritoneal fluid. Normal appearing adrenal glands, pancreas and gallbladder. Normal appearing appendix. No evidence of bony metastatic disease. Lumbar and lower thoracic spine degenerative changes. Mildly prominent interstitial markings at the lung bases. Minimal dependent atelectasis at both lung bases. The previously noted left lower lobe nodule is not currently included.  IMPRESSION: 1. 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 2. Minimal free peritoneal fluid. 3. Slow  growth of a probable metastasis in  the spleen. 4. Treated metastasis in the left lobe of the liver. 5. Previously demonstrated hemangiomas in the right lobe of the liver and probable small liver cysts. 6. Stable small probable metastatic left para-aortic lymph node.   Electronically Signed   By: Claudie Revering M.D.   On: 10/24/2014 20:13   Dg Abd Acute W/chest  10/24/2014   CLINICAL DATA:  Intermittent abdominal pain since Tuesday. Nausea, vomiting and constipation. History of colon cancer.  EXAM: DG ABDOMEN ACUTE W/ 1V CHEST  COMPARISON:  08/19/2014; 08/05/2014; CT abdomen pelvis - 08/17/2014  FINDINGS: Grossly unchanged cardiac silhouette and mediastinal contours. No focal parenchymal opacities. No pleural effusion or pneumothorax. No evidence of edema. Stable position of support apparatus.  A presumed colonic stent overlies the left hemipelvis. There is moderate upstream gaseous distention of the colon. There is no significant gaseous distention of the small bowel. No pneumoperitoneum, pneumatosis or portal venous gas.  Several phleboliths overlie the left hemipelvis. Otherwise, no abnormal intra-abdominal calcifications.  No acute osseus abnormalities. Degenerative change of the pubic symphysis.  IMPRESSION: A presumed colonic stent overlies the left hemipelvis. This is associated with moderate upstream distention of the more proximal colon without definitive distention of the small bowel. While potentially representative an ileus, on early distal colonic obstruction could have a similar appearance. Clinical correlation is advised.   Electronically Signed   By: Sandi Mariscal M.D.   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 Unfortunately, there are likely not any endoscopic options for this patient at this time. Patient would likely benefit from surgical evaluation and  probable diverting colostomy. Would also consider involving palliative care at this point, as well as oncology to help patient understand her treatment options.    Roselee Tayloe, Deloris Ping 10/25/2014,  Pager [phone]

## 2014-10-25 NOTE — Consult Note (Signed)
Reason for Consult:  Rectosigmoid stage IV cancer with obstruction, into the previous stent place 08/19/14. Referring Physician:  Dr. Terance Ice.  [name] [name] is an 64 y.o. female.  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  She is followed by Dr.Feng for Oncology.  She has declined any surgery to this point, and has had 2 stents placed by GI for palliative relief of her obstruction.  The first 06/19/13, the second was 08/19/14.  The last note by Dr. Lucia Gaskins was quite clear on her wishes to not have any surgery. Pt presents now with abdominal bloating, nausea and some loose stools, the last stool was 0100 hours 10/24/14.  Notes indicate she told others she was having BM's and  they were hard,then  her abdomen became distended. She tells me she was fine till 10/23/14 she had some bloating, BM early 4/21, more bloating and nausea with vomiting.  She came to the ED yesterday afternoon. On 4/19, she went to the gym and worked out.   Work up shows she is afebrile, labs show K+ 3.2 this AM.  Ct scan shows:  distal sigmoid colon malignancy has grown into the lumen of the stent and is causing almost complete obstruction of the lumen with associated dilatation of the colon proximal to the stent with a significant amount of stool and gas in the colon proximal to the stent.  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 Treated metastasis in the left lobe of the liver.  Stable small probable metastatic left para-aortic lymph node.  We are ask to see.    We consulted on her last admit 08/17/14, and Dr. Lucia Gaskins.   Past Medical History  Diagnosis Date  . Anxiety   . Colon cancer     dx'd 2014    Past Surgical History  Procedure Laterality Date  . Abdominal hysterectomy  2005  . Cesarean section  1976  . Flexible sigmoidoscopy N/A 05/25/2013    Procedure: FLEXIBLE SIGMOIDOSCOPY;  Surgeon:  [name], MD;  Location: [facility];  Service: Endoscopy;  Laterality: N/A;  needs floro  . Colonic stent placement N/A 05/25/2013    Procedure: COLONIC STENT PLACEMENT;  Surgeon: [name], MD;  Location: [facility];  Service: Endoscopy;  Laterality: N/A;  . Portacath placement Right 06/25/2013    Procedure: ULTRA SOUND GUIDED INSERTION PORT-A-CATH;  Surgeon: [name], MD;  Location: [place];  Service: General;  Laterality: Right;  . Flexible sigmoidoscopy N/A 08/19/2014    Procedure: FLEXIBLE SIGMOIDOSCOPY;  Surgeon: Gatha Mayer, MD;  Location: [facility];  Service: Endoscopy;  Laterality: N/A;  . Colonic stent placement N/A 08/19/2014    Procedure: COLONIC STENT PLACEMENT;  Surgeon: Gatha Mayer, MD;  Location: [facility];  Service: Endoscopy;  Laterality: N/A;  . Flexible sigmoidoscopy N/A 08/19/2014    Procedure: FLEXIBLE SIGMOIDOSCOPY;  Surgeon: Gatha Mayer, MD;  Location: [facility];  Service: Endoscopy;  Laterality: N/A;  . Colonic stent placement N/A 08/19/2014    Procedure: COLONIC STENT PLACEMENT;  Surgeon: Gatha Mayer, MD;  Location: [facility];  Service: Endoscopy;  Laterality: N/A;    Family History  Problem Relation Age of Onset  . Colon cancer Sister 23  . Diabetes Neg Hx   . Thyroid disease Neg Hx   . Breast cancer Maternal Aunt   . Prostate cancer Maternal  Uncle   . Bone cancer Maternal Grandfather   . Prostate cancer Maternal Uncle     Social History:  reports that she quit smoking about 43 years ago. Her smoking use included Cigarettes. She quit after 1 year of use. She has never used smokeless tobacco. She reports that she does not drink alcohol or use illicit drugs.  Allergies:  Allergies  Allergen Reactions  . Codeine Itching and Nausea And Vomiting    Medications:  Prior to Admission:  Prescriptions prior to admission  Medication Sig Dispense Refill Last Dose  . capecitabine (XELODA) 500 MG tablet  Take 3 tablets (1,500 mg total) by mouth 2 (two) times daily after a meal. 84 tablet 2 Past Week at Unknown time  . docusate sodium (COLACE) 100 MG capsule Take 200 mg by mouth daily as needed for mild constipation.   Past Week at Unknown time  . lidocaine-prilocaine (EMLA) cream Apply 1 application topically as needed. Apply to port site one hour before treatment and cover with plastic wrap. 30 g 3 Past Month at Unknown time  . LORazepam (ATIVAN) 0.5 MG tablet Take 1 tablet (0.5 mg total) by mouth every 8 (eight) hours as needed (nausea/vomiting). 60 tablet 0 unknown  . ondansetron (ZOFRAN) 8 MG tablet Take 39m by mouth every 12 hours as needed for nausea/vomiting.  Take 8 mg by mouth  30 minutes before taking chemo pills, and as needed for nausea/vomiting. 30 tablet 1 unknown  . polyethylene glycol powder (GLYCOLAX/MIRALAX) powder TAKE 34 G (2 DOSES) BY MOUTH DAILY AS NEEDED FOR MILD CONSTIPATION OR MODERATE CONSTIPATION (Patient taking differently: TAKE 1-2 DOSES BY MOUTH DAILY AS NEEDED FOR MILD CONSTIPATION OR MODERATE CONSTIPATION) 255 g 0 10/24/2014 at Unknown time  . prochlorperazine (COMPAZINE) 10 MG tablet TAKE 1 TABLET (10 MG TOTAL) BY MOUTH EVERY 6 (SIX) HOURS AS NEEDED (NAUSEA OR VOMITING). 30 tablet 1 unknown  . HYDROcodone-acetaminophen (NORCO) 5-325 MG per tablet Take 1-2 tablets by mouth every 6 (six) hours as needed. (Patient not taking: Reported on 10/24/2014) 10 tablet 0 Not Taking at Unknown time  . loperamide (IMODIUM A-D) 2 MG tablet Take 2 at onset of diarrhea, then 1 every 2hrs until 12hr without a BM. May take 2 tab every 4hrs at bedtime. If diarrhea recurs repeat. (Patient not taking: Reported on 08/16/2014) 100 tablet 1 Not Taking at Unknown time   Scheduled: . antiseptic oral rinse  7 mL Mouth Rinse q12n4p  . chlorhexidine  15 mL Mouth Rinse BID  . potassium chloride  10 mEq Intravenous Q1 Hr x 4   Continuous: . dextrose 5 % and 0.45% NaCl 1,000 mL with potassium chloride 20  mEq infusion 75 mL/hr at 10/25/14 1205   PLTJ:QZESPQZRAQTMA(DILAUDID) injection, LORazepam, ondansetron **OR** ondansetron (ZOFRAN) IV Anti-infectives    None      Results for orders placed or performed during the hospital encounter of 10/24/14 (from the past 48 hour(s))  CBC with Differential     Status: Abnormal   Collection Time: 10/24/14  2:44 PM  Result Value Ref Range   WBC 6.6 4.0 - 10.5 K/uL   RBC 4.15 3.87 - 5.11 MIL/uL   Hemoglobin 11.4 (L) 12.0 - 15.0 g/dL   HCT 35.2 (L) 36.0 - 46.0 %   MCV 84.8 78.0 - 100.0 fL   MCH 27.5 26.0 - 34.0 pg   MCHC 32.4 30.0 - 36.0 g/dL   RDW 13.0 11.5 - 15.5 %   Platelets 288 150 - 400  K/uL   Neutrophils Relative % 64 43 - 77 %   Neutro Abs 4.2 1.7 - 7.7 K/uL   Lymphocytes Relative 26 12 - 46 %   Lymphs Abs 1.7 0.7 - 4.0 K/uL   Monocytes Relative 6 3 - 12 %   Monocytes Absolute 0.4 0.1 - 1.0 K/uL   Eosinophils Relative 4 0 - 5 %   Eosinophils Absolute 0.3 0.0 - 0.7 K/uL   Basophils Relative 0 0 - 1 %   Basophils Absolute 0.0 0.0 - 0.1 K/uL  Comprehensive metabolic panel     Status: Abnormal   Collection Time: 10/24/14  2:44 PM  Result Value Ref Range   Sodium 138 135 - 145 mmol/L   Potassium 3.8 3.5 - 5.1 mmol/L   Chloride 108 96 - 112 mmol/L   CO2 21 19 - 32 mmol/L   Glucose, Bld 97 70 - 99 mg/dL   BUN 13 6 - 23 mg/dL   Creatinine, Ser 0.69 0.50 - 1.10 mg/dL   Calcium 11.0 (H) 8.4 - 10.5 mg/dL   Total Protein 8.5 (H) 6.0 - 8.3 g/dL   Albumin 3.7 3.5 - 5.2 g/dL   AST 19 0 - 37 U/L   ALT 11 0 - 35 U/L   Alkaline Phosphatase 72 39 - 117 U/L   Total Bilirubin 0.1 (L) 0.3 - 1.2 mg/dL   GFR calc non Af Amer >90 >90 mL/min   GFR calc Af Amer >90 >90 mL/min    Comment: (NOTE) The eGFR has been calculated using the CKD EPI equation. This calculation has not been validated in all clinical situations. eGFR's persistently <90 mL/min signify possible Chronic Kidney Disease.    Anion gap 9 5 - 15  Urinalysis, Routine w reflex  microscopic     Status: Abnormal   Collection Time: 10/24/14  2:50 PM  Result Value Ref Range   Color, Urine YELLOW YELLOW   APPearance CLEAR CLEAR   Specific Gravity, Urine 1.019 1.005 - 1.030   pH 5.5 5.0 - 8.0   Glucose, UA NEGATIVE NEGATIVE mg/dL   Hgb urine dipstick NEGATIVE NEGATIVE   Bilirubin Urine NEGATIVE NEGATIVE   Ketones, ur NEGATIVE NEGATIVE mg/dL   Protein, ur NEGATIVE NEGATIVE mg/dL   Urobilinogen, UA 0.2 0.0 - 1.0 mg/dL   Nitrite NEGATIVE NEGATIVE   Leukocytes, UA MODERATE (A) NEGATIVE  Urine microscopic-add on     Status: Abnormal   Collection Time: 10/24/14  2:50 PM  Result Value Ref Range   Squamous Epithelial / LPF RARE RARE   WBC, UA 3-6 <3 WBC/hpf   RBC / HPF 0-2 <3 RBC/hpf   Bacteria, UA FEW (A) RARE   Urine-Other MUCOUS PRESENT   I-Stat CG4 Lactic Acid, ED     Status: None   Collection Time: 10/24/14  9:11 PM  Result Value Ref Range   Lactic Acid, Venous 0.65 0.5 - 2.0 mmol/L  Basic metabolic panel     Status: Abnormal   Collection Time: 10/25/14  5:55 AM  Result Value Ref Range   Sodium 136 135 - 145 mmol/L   Potassium 3.2 (L) 3.5 - 5.1 mmol/L   Chloride 107 96 - 112 mmol/L   CO2 23 19 - 32 mmol/L   Glucose, Bld 125 (H) 70 - 99 mg/dL   BUN 10 6 - 23 mg/dL   Creatinine, Ser 0.62 0.50 - 1.10 mg/dL   Calcium 9.8 8.4 - 10.5 mg/dL   GFR calc non Af Amer >90 >90 mL/min  GFR calc Af Amer >90 >90 mL/min    Comment: (NOTE) The eGFR has been calculated using the CKD EPI equation. This calculation has not been validated in all clinical situations. eGFR's persistently <90 mL/min signify possible Chronic Kidney Disease.    Anion gap 6 5 - 15  CBC     Status: Abnormal   Collection Time: 10/25/14  5:55 AM  Result Value Ref Range   WBC 6.1 4.0 - 10.5 K/uL   RBC 3.93 3.87 - 5.11 MIL/uL   Hemoglobin 10.8 (L) 12.0 - 15.0 g/dL   HCT 33.8 (L) 36.0 - 46.0 %   MCV 86.0 78.0 - 100.0 fL   MCH 27.5 26.0 - 34.0 pg   MCHC 32.0 30.0 - 36.0 g/dL   RDW 13.1 11.5 -  15.5 %   Platelets 280 150 - 400 K/uL    Dg Abd 1 View  10/24/2014   CLINICAL DATA:  NG tube placement  EXAM: ABDOMEN - 1 VIEW  COMPARISON:  10/24/2014 CT  FINDINGS: The NG tube tip projects over the gastric fundus. A central venous catheter tip projects over the cavoatrial junction. A bowel stent projects over the region of the rectosigmoid colon. There is gaseous distention of proximal colon, in keeping with delayed transit. Previously ingested contrast opacifies loops of small bowel within the pelvis. Small bowel loops are of normal caliber. No acute osseous finding.  IMPRESSION: NG tube tip projects over the stomach.  Distention of colon to the level of the sigmoid stent, in keeping with delayed transit / near complete obstruction.   Electronically Signed   By: Carlos Levering M.D.   On: 10/24/2014 22:06   Ct Abdomen Pelvis W Contrast  10/24/2014   CLINICAL DATA:  Diffuse abdominal pain and constipation for the past 4 days. Abdominal distention. Nausea and vomiting. Colonic stent placed in February of this year. 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  EXAM: CT ABDOMEN AND PELVIS WITH CONTRAST  TECHNIQUE: Multidetector CT imaging of the abdomen and pelvis was performed using the standard protocol following bolus administration of intravenous contrast.  CONTRAST:  170mL OMNIPAQUE IOHEXOL 300 MG/ML  SOLN  COMPARISON:  Radiographs obtained earlier today. Abdomen and pelvis CTs, the most recent dated 08/17/2014. Abdomen MR dated 05/24/2013.  FINDINGS: 1.9 cm arm rounded, low density in the mass with peripheral contrast puddling in the right lobe of the liver. 7.0 cm similar appearing mass more inferiorly in the right lobe. There are also several small probable cysts in the liver, all without significant change. A 1.0 cm calcification in the superior aspect of the junction of the medial and lateral segments of the left lobe is also unchanged. This  is at the location of a 3.3 cm probable metastasis seen on 05/22/2013. That mass is no longer seen.  A 5.9 x 5.1 cm oval, mildly heterogeneous mass is noted in the spleen on the left, measured on image number 23. This measured 4.5 x 4.0 cm 05/22/2013.  Dilated, stool and gas filled colon to the level of a sigmoid colon stent. There is soft tissue thickening within the lumen of the stent with a nodular appearance, causing almost complete obstruction of the lumen with no low stool seen distally.  Left para-aortic lymph node with a short axis diameter of 7 mm on image number 50 without significant change since 08/17/2014. No intervally enlarged lymph nodes.  Tiny calculus in the mid to upper left kidney. There is  also a 7 mm calculus in the mid to lower left kidney.  No gastric or small bowel dilatation. Minimal free peritoneal fluid. Normal appearing adrenal glands, pancreas and gallbladder. Normal appearing appendix. No evidence of bony metastatic disease. Lumbar and lower thoracic spine degenerative changes. Mildly prominent interstitial markings at the lung bases. Minimal dependent atelectasis at both lung bases. The previously noted left lower lobe nodule is not currently included.  IMPRESSION: 1. 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 2. Minimal free peritoneal fluid. 3. 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 4. Treated metastasis in the left lobe of the liver. 5. Previously demonstrated hemangiomas in the right lobe of the liver and probable small liver cysts. 6. Stable small probable metastatic left para-aortic lymph node.   Electronically Signed   By: Claudie Revering M.D.   On: 10/24/2014 20:13   Dg Abd Acute W/chest  10/24/2014   CLINICAL DATA:   Intermittent abdominal pain since Tuesday. Nausea, vomiting and constipation. History of colon cancer.  EXAM: DG ABDOMEN ACUTE W/ 1V CHEST  COMPARISON:  08/19/2014; 08/05/2014; CT abdomen pelvis - 08/17/2014  FINDINGS: Grossly unchanged cardiac silhouette and mediastinal contours. No focal parenchymal opacities. No pleural effusion or pneumothorax. No evidence of edema. Stable position of support apparatus.  A presumed colonic stent overlies the left hemipelvis. There is moderate upstream gaseous distention of the colon. There is no significant gaseous distention of the small bowel. No pneumoperitoneum, pneumatosis or portal venous gas.  Several phleboliths overlie the left hemipelvis. Otherwise, no abnormal intra-abdominal calcifications.  No acute osseus abnormalities. Degenerative change of the pubic symphysis.  IMPRESSION: A presumed colonic stent overlies the left hemipelvis. This is associated with moderate upstream distention of the more proximal colon without definitive distention of the small bowel. While potentially representative an ileus, on early distal colonic obstruction could have a similar appearance. Clinical correlation is advised.   Electronically Signed   By: Sandi Mariscal M.D.   On: 10/24/2014 16:27    Review of Systems  Constitutional: Negative.   HENT: Negative.   Eyes: Negative.   Respiratory: Negative.   Cardiovascular: Negative.   Gastrointestinal: Positive for nausea. Negative for abdominal pain and diarrhea.       Abdominal distension Having loose stools, the last was Thursday AM 0100 hours.  Nothing since that time.  Genitourinary: Negative.   Musculoskeletal: Negative.   Skin: Negative.   Neurological: Negative.   Endo/Heme/Allergies: Negative.   Psychiatric/Behavioral: Negative.    Blood pressure 134/82, pulse 92, temperature 98.4 F (36.9 C), temperature source Oral, resp. rate 16, weight 69.5 kg (153 lb 3.5 oz), SpO2 98 %. Physical Exam  Constitutional: She is  oriented to person, place, and time. She appears well-developed and well-nourished. No distress.  HENT:  Head: Normocephalic and atraumatic.  Nose: Nose normal.  NG in place.  Eyes: Conjunctivae and EOM are normal. Right eye exhibits no discharge. Left eye exhibits no discharge. No scleral icterus.  Neck: Normal range of motion. Neck supple. No JVD present. No tracheal deviation present. No thyromegaly present.  Cardiovascular: Normal rate, regular rhythm, normal heart sounds and intact distal pulses.   Respiratory: Effort normal and breath sounds normal. No respiratory distress. She has no wheezes. She has  no rales. She exhibits no tenderness.  GI: Soft. She exhibits distension (minimal if any). She exhibits no mass. There is no tenderness. There is no rebound and no guarding.  Hyperactive bowel sounds.  Musculoskeletal: She exhibits no edema or tenderness.  Lymphadenopathy:    She has no cervical adenopathy.  Neurological: She is alert and oriented to person, place, and time. No cranial nerve deficit.  Skin: Skin is warm and dry. No rash noted. She is not diaphoretic. No erythema. No pallor.  Psychiatric: She has a normal mood and affect. Her behavior is normal. Judgment and thought content normal.    Assessment/Plan: 1.  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 2. 5 mm left lung nodule 3.  Hx of anxiety  Plan:  Options are limited.  Dr. Redmond Pulling has reviewed her studies before i came to see her.  We would recommend she be seen by GI, and see if there is another option available with her stent.  Oncology to let them review her issues and discuss options with treatments.  It may be possible to do a diverting colostomy, but Dr. Redmond Pulling did not think anything else would be likely at this point.  We may also consider Palliative consult.  We discussed this she has family in the room and she may be amenable to diverting colostomy.  Dr. Redmond Pulling will see this  afternoon and we will await GI opinion.   NG is in place and she is not really uncomfortable or in any distress.   [name] [name] 10/25/2014, 11:29 AM

## 2014-10-25 NOTE — Progress Notes (Signed)
UR complete 

## 2014-10-25 NOTE — Progress Notes (Signed)
[name] [name]   DOB:Jan 26, 1951   JK#:[id number]   YKD#:[id number]  Subjective: Pt is well known to me, currently on palliative chemo for stage IV colon cancer. She was admitted for bowel obstruction due to colon cancer. NG tube placed.    Objective:  Filed Vitals:   10/25/14 2200  BP: 131/60  Pulse: 76  Temp: 98 F (36.7 C)  Resp: 16    Body mass index is 25.5 kg/(m^2).  Intake/Output Summary (Last 24 hours) at 10/25/14 2357 Last data filed at 10/25/14 1858  Gross per 24 hour  Intake      0 ml  Output    350 ml  Net   -350 ml     Sclerae unicteric  Oropharynx clear, (+) NG tube   No peripheral adenopathy  Lungs clear -- no rales or rhonchi  Heart regular rate and rhythm  Abdomen benign  MSK no focal spinal tenderness, no peripheral edema  Neuro nonfocal    CBG (last 3)  No results for input(s): GLUCAP in the last 72 hours.   Labs:  Lab Results  Component Value Date   WBC 6.1 10/25/2014   HGB 10.8* 10/25/2014   HCT 33.8* 10/25/2014   MCV 86.0 10/25/2014   PLT 280 10/25/2014   NEUTROABS 4.2 10/24/2014    @LASTCHEMISTRY @  Urine Studies No results for input(s): UHGB, CRYS in the last 72 hours.  Invalid input(s): UACOL, UAPR, USPG, UPH, UTP, UGL, UKET, UBIL, UNIT, UROB, ULEU, UEPI, UWBC, URBC, UBAC, CAST, UCOM, BILUA  Basic Metabolic Panel:  Recent Labs Lab 10/24/14 1444 10/25/14 0555  NA 138 136  K 3.8 3.2*  CL 108 107  CO2 21 23  GLUCOSE 97 125*  BUN 13 10  CREATININE 0.69 0.62  CALCIUM 11.0* 9.8   GFR Estimated Creatinine Clearance: 71.4 mL/min (by C-G formula based on Cr of 0.62). Liver Function Tests:  Recent Labs Lab 10/24/14 1444  AST 19  ALT 11  ALKPHOS 72  BILITOT 0.1*  PROT 8.5*  ALBUMIN 3.7   No results for input(s): LIPASE, AMYLASE in the last 168 hours. No results for input(s): AMMONIA in the last 168 hours. Coagulation profile No results for input(s): INR, PROTIME in the last 168 hours.  CBC:  Recent Labs Lab  10/24/14 1444 10/25/14 0555  WBC 6.6 6.1  NEUTROABS 4.2  --   HGB 11.4* 10.8*  HCT 35.2* 33.8*  MCV 84.8 86.0  PLT 288 280    Studies:  Dg Abd 1 View  10/24/2014   CLINICAL DATA:  NG tube placement  EXAM: ABDOMEN - 1 VIEW  COMPARISON:  10/24/2014 CT  FINDINGS: The NG tube tip projects over the gastric fundus. A central venous catheter tip projects over the cavoatrial junction. A bowel stent projects over the region of the rectosigmoid colon. There is gaseous distention of proximal colon, in keeping with delayed transit. Previously ingested contrast opacifies loops of small bowel within the pelvis. Small bowel loops are of normal caliber. No acute osseous finding.  IMPRESSION: NG tube tip projects over the stomach.  Distention of colon to the level of the sigmoid stent, in keeping with delayed transit / near complete obstruction.   Electronically Signed   By: Carlos Levering M.D.   On: 10/24/2014 22:06   Ct Abdomen Pelvis W Contrast  10/24/2014   CLINICAL DATA:  Diffuse abdominal pain and constipation for the past 4 days. Abdominal distention. Nausea and vomiting. Colonic stent placed in February of this year. History  of a rectosigmoid malignancy. The patient did not want surgery. Associated liver metastases.  EXAM: CT ABDOMEN AND PELVIS WITH CONTRAST  TECHNIQUE: Multidetector CT imaging of the abdomen and pelvis was performed using the standard protocol following bolus administration of intravenous contrast.  CONTRAST:  182mL OMNIPAQUE IOHEXOL 300 MG/ML  SOLN  COMPARISON:  Radiographs obtained earlier today. Abdomen and pelvis CTs, the most recent dated 08/17/2014. Abdomen MR dated 05/24/2013.  FINDINGS: 1.9 cm arm rounded, low density in the mass with peripheral contrast puddling in the right lobe of the liver. 7.0 cm similar appearing mass more inferiorly in the right lobe. There are also several small probable cysts in the liver, all without significant change. A 1.0 cm calcification in the  superior aspect of the junction of the medial and lateral segments of the left lobe is also unchanged. This is at the location of a 3.3 cm probable metastasis seen on 05/22/2013. That mass is no longer seen.  A 5.9 x 5.1 cm oval, mildly heterogeneous mass is noted in the spleen on the left, measured on image number 23. This measured 4.5 x 4.0 cm 05/22/2013.  Dilated, stool and gas filled colon to the level of a sigmoid colon stent. There is soft tissue thickening within the lumen of the stent with a nodular appearance, causing almost complete obstruction of the lumen with no low stool seen distally.  Left para-aortic lymph node with a short axis diameter of 7 mm on image number 50 without significant change since 08/17/2014. No intervally enlarged lymph nodes.  Tiny calculus in the mid to upper left kidney. There is also a 7 mm calculus in the mid to lower left kidney.  No gastric or small bowel dilatation. Minimal free peritoneal fluid. Normal appearing adrenal glands, pancreas and gallbladder. Normal appearing appendix. No evidence of bony metastatic disease. Lumbar and lower thoracic spine degenerative changes. Mildly prominent interstitial markings at the lung bases. Minimal dependent atelectasis at both lung bases. The previously noted left lower lobe nodule is not currently included.  IMPRESSION: 1. 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 2. Minimal free peritoneal fluid. 3. 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 4. Treated metastasis in the left lobe of the liver. 5. Previously demonstrated hemangiomas in the right lobe of the liver and probable small liver cysts. 6. Stable small probable metastatic left para-aortic lymph node.   Electronically Signed    By: Claudie Revering M.D.   On: 10/24/2014 20:13   Dg Abd Acute W/chest  10/24/2014   CLINICAL DATA:  Intermittent abdominal pain since Tuesday. Nausea, vomiting and constipation. History of colon cancer.  EXAM: DG ABDOMEN ACUTE W/ 1V CHEST  COMPARISON:  08/19/2014; 08/05/2014; CT abdomen pelvis - 08/17/2014  FINDINGS: Grossly unchanged cardiac silhouette and mediastinal contours. No focal parenchymal opacities. No pleural effusion or pneumothorax. No evidence of edema. Stable position of support apparatus.  A presumed colonic stent overlies the left hemipelvis. There is moderate upstream gaseous distention of the colon. There is no significant gaseous distention of the small bowel. No pneumoperitoneum, pneumatosis or portal venous gas.  Several phleboliths overlie the left hemipelvis. Otherwise, no abnormal intra-abdominal calcifications.  No acute osseus abnormalities. Degenerative change of the pubic symphysis.  IMPRESSION: A  presumed colonic stent overlies the left hemipelvis. This is associated with moderate upstream distention of the more proximal colon without definitive distention of the small bowel. While potentially representative an ileus, on early distal colonic obstruction could have a similar appearance. Clinical correlation is advised.   Electronically Signed   By: Sandi Mariscal M.D.   On: 10/24/2014 16:27    Assessment: 64 y.o.  1. 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 Recurrent colon obstruction by tumor, S/p migrated colorectal stent, s/p retrieval on 05/14 and replaced on 08/17/14 due to obstruction, now with tumor growth throughI  the stent  3. 4. 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  4. 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 I encourage pt to consider surgery now, since restenting is less likely an option. Her systemic disease burden is not very high, and she was otherwise clinically doing well before the  admission. Also considering she is relatively young, still on first line chemo, she still has more systemic treatment options. Although the overall goal of her care is palliation, the diverting loop colostomy would certainly relieve the obstruction and improve her quality of life. She voiced good understanding and is thinking about the surgery.  Hold oral chemo capecitabine during her hospital stay.   I will follow her as needed in house, and see her back after hospital discharge.   Thressa Sheller, MD 10/25/2014  11:57 PM

## 2014-10-26 DIAGNOSIS — D630 Anemia in neoplastic disease: Secondary | ICD-10-CM

## 2014-10-26 DIAGNOSIS — C7989 Secondary malignant neoplasm of other specified sites: Secondary | ICD-10-CM

## 2014-10-26 DIAGNOSIS — D6481 Anemia due to antineoplastic chemotherapy: Secondary | ICD-10-CM

## 2014-10-26 DIAGNOSIS — C787 Secondary malignant neoplasm of liver and intrahepatic bile duct: Secondary | ICD-10-CM

## 2014-10-26 LAB — BASIC METABOLIC PANEL
ANION GAP: 8 (ref 5–15)
BUN: 11 mg/dL (ref 6–23)
CO2: 23 mmol/L (ref 19–32)
CREATININE: 0.59 mg/dL (ref 0.50–1.10)
Calcium: 10.1 mg/dL (ref 8.4–10.5)
Chloride: 106 mmol/L (ref 96–112)
GFR calc non Af Amer: >90 mL/min (ref >90)
Glucose, Bld: 157 mg/dL — ABNORMAL HIGH (ref 70–99)
Potassium: 3.2 mmol/L — ABNORMAL LOW (ref 3.5–5.1)
Sodium: 137 mmol/L (ref 135–145)

## 2014-10-26 MED ORDER — POTASSIUM CHLORIDE 10 MEQ/100ML IV SOLN
10.0000 meq | INTRAVENOUS | Status: AC
  Administered 2014-10-26 (×2): 10 meq via INTRAVENOUS
  Filled 2014-10-26 (×2): qty 100

## 2014-10-26 NOTE — Progress Notes (Signed)
Subjective: Not much abd pain; +loose BM  Objective: Vital signs in last 24 hours: Temp:  [97.8 F (36.6 C)-98.2 F (36.8 C)] 97.8 F (36.6 C) (04/23 0515) Pulse Rate:  [73-83] 73 (04/23 0515) Resp:  [16] 16 (04/23 0515) BP: (125-137)/(58-72) 125/58 mmHg (04/23 0515) SpO2:  [97 %-98 %] 98 % (04/23 0515) Weight:  [70.1 kg (154 lb 8.7 oz)] 70.1 kg (154 lb 8.7 oz) (04/23 0515) Last BM Date: 10/26/14  Intake/Output from previous day: 04/22 0701 - 04/23 0700 In: 1377.5 [I.V.:1377.5] Out: 100 [Emesis/NG output:100] Intake/Output this shift: Total I/O In: 0  Out: 100 [Emesis/NG output:100]  Alert, nontoxic Soft, some distension, NT  Lab Results:   Recent Labs  10/24/14 1444 10/25/14 0555  WBC 6.6 6.1  HGB 11.4* 10.8*  HCT 35.2* 33.8*  PLT 288 280   BMET  Recent Labs  10/25/14 0555 10/26/14 1010  NA 136 137  K 3.2* 3.2*  CL 107 106  CO2 23 23  GLUCOSE 125* 157*  BUN 10 11  CREATININE 0.62 0.59  CALCIUM 9.8 10.1   PT/INR No results for input(s): LABPROT, INR in the last 72 hours. ABG No results for input(s): PHART, HCO3 in the last 72 hours.  Invalid input(s): PCO2, PO2  Studies/Results: Dg Abd 1 View  10/24/2014   CLINICAL DATA:  NG tube placement  EXAM: ABDOMEN - 1 VIEW  COMPARISON:  10/24/2014 CT  FINDINGS: The NG tube tip projects over the gastric fundus. A central venous catheter tip projects over the cavoatrial junction. A bowel stent projects over the region of the rectosigmoid colon. There is gaseous distention of proximal colon, in keeping with delayed transit. Previously ingested contrast opacifies loops of small bowel within the pelvis. Small bowel loops are of normal caliber. No acute osseous finding.  IMPRESSION: NG tube tip projects over the stomach.  Distention of colon to the level of the sigmoid stent, in keeping with delayed transit / near complete obstruction.   Electronically Signed   By: Carlos Levering M.D.   On: 10/24/2014 22:06   Ct  Abdomen Pelvis W Contrast  10/24/2014   CLINICAL DATA:  Diffuse abdominal pain and constipation for the past 4 days. Abdominal distention. Nausea and vomiting. Colonic stent placed in February of this year. 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  EXAM: CT ABDOMEN AND PELVIS WITH CONTRAST  TECHNIQUE: Multidetector CT imaging of the abdomen and pelvis was performed using the standard protocol following bolus administration of intravenous contrast.  CONTRAST:  143mL OMNIPAQUE IOHEXOL 300 MG/ML  SOLN  COMPARISON:  Radiographs obtained earlier today. Abdomen and pelvis CTs, the most recent dated 08/17/2014. Abdomen MR dated 05/24/2013.  FINDINGS: 1.9 cm arm rounded, low density in the mass with peripheral contrast puddling in the right lobe of the liver. 7.0 cm similar appearing mass more inferiorly in the right lobe. There are also several small probable cysts in the liver, all without significant change. A 1.0 cm calcification in the superior aspect of the junction of the medial and lateral segments of the left lobe is also unchanged. This is at the location of a 3.3 cm probable metastasis seen on 05/22/2013. That mass is no longer seen.  A 5.9 x 5.1 cm oval, mildly heterogeneous mass is noted in the spleen on the left, measured on image number 23. This measured 4.5 x 4.0 cm 05/22/2013.  Dilated, stool and gas filled colon to the level of a sigmoid  colon stent. There is soft tissue thickening within the lumen of the stent with a nodular appearance, causing almost complete obstruction of the lumen with no low stool seen distally.  Left para-aortic lymph node with a short axis diameter of 7 mm on image number 50 without significant change since 08/17/2014. No intervally enlarged lymph nodes.  Tiny calculus in the mid to upper left kidney. There is also a 7 mm calculus in the mid to lower left kidney.  No gastric or small bowel dilatation. Minimal free  peritoneal fluid. Normal appearing adrenal glands, pancreas and gallbladder. Normal appearing appendix. No evidence of bony metastatic disease. Lumbar and lower thoracic spine degenerative changes. Mildly prominent interstitial markings at the lung bases. Minimal dependent atelectasis at both lung bases. The previously noted left lower lobe nodule is not currently included.  IMPRESSION: 1. 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 2. Minimal free peritoneal fluid. 3. 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 4. Treated metastasis in the left lobe of the liver. 5. Previously demonstrated hemangiomas in the right lobe of the liver and probable small liver cysts. 6. Stable small probable metastatic left para-aortic lymph node.   Electronically Signed   By: Claudie Revering M.D.   On: 10/24/2014 20:13   Dg Abd Acute W/chest  10/24/2014   CLINICAL DATA:  Intermittent abdominal pain since Tuesday. Nausea, vomiting and constipation. History of colon cancer.  EXAM: DG ABDOMEN ACUTE W/ 1V CHEST  COMPARISON:  08/19/2014; 08/05/2014; CT abdomen pelvis - 08/17/2014  FINDINGS: Grossly unchanged cardiac silhouette and mediastinal contours. No focal parenchymal opacities. No pleural effusion or pneumothorax. No evidence of edema. Stable position of support apparatus.  A presumed colonic stent overlies the left hemipelvis. There is moderate upstream gaseous distention of the colon. There is no significant gaseous distention of the small bowel. No pneumoperitoneum, pneumatosis or portal venous gas.  Several phleboliths overlie the left hemipelvis. Otherwise, no abnormal intra-abdominal calcifications.  No acute osseus abnormalities. Degenerative change of the pubic symphysis.   IMPRESSION: A presumed colonic stent overlies the left hemipelvis. This is associated with moderate upstream distention of the more proximal colon without definitive distention of the small bowel. While potentially representative an ileus, on early distal colonic obstruction could have a similar appearance. Clinical correlation is advised.   Electronically Signed   By: Sandi Mariscal M.D.   On: 10/24/2014 16:27    Anti-infectives: Anti-infectives    None      Assessment/Plan: 1. 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 2. 5 mm left lung nodule 3. Hx of anxiety  Pt has agreed to surgery. Will plan on OR on Monday. Will start gentle bowel prep today.  Definitive surgical plan will be decided by Dr Zella Richer, our surgeon next week at [facility]. Diverting loop colostomy vs resection with end colostomy  Leighton Ruff. Redmond Pulling, MD, FACS General, Bariatric, & Minimally Invasive Surgery [facility] Surgery, [place]   LOS: 2 days    Gayland Curry 10/26/2014

## 2014-10-26 NOTE — Progress Notes (Signed)
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 Over the last 3 days, she has had progressive worsening upper abd pain with increased distention and nausea, vomiting bilious material. Minimal flatus. No fevers. Ct scan today shows tumor growth into/surrounding the stent causing obstruction. She was referred for admission for bowel obstruction.  Subjective / 24 H Interval events - complains of nausea today and feeling "queasy"; complains of abdominal distention - no chest pain, dyspnea - agreeing with surgery   Assessment/Plan: Principal Problem:   Colon obstruction Active Problems:   Colorectal cancer, stage IV   Nausea with vomiting   Bowel obstruction   Anxiety   Stage IV colorectal cancer with recurrent colonic obstruction - she is s/p stenting x 2 by GI most recently February 2016. - she refused surgery in the past however is now agreeable, general surgery plans for this Monday. Surgical plan to be determined on Monday.  - CT scan on admission showed that the sigmoid malignancy has grown into the lumen of the stent cause almost complete obstruction with upstream colonic dilatation. No perforation was appreciated. - followed by Dr. Burr Medico from oncology, seen in the hospital as well.   Nausea/vomiting  - due to #1, NG tube in place, supportive treatment  Hypokalemia - due to GI losses, replete IV - recheck BMP today, K still at 3.2 continue K in IVF and do 2 extra runs.   Anemia - likely in the setting of colon cancer, no overt bleeding   Diet:   NPO Fluids: D5 1/2 NS with 20 mEq K DVT Prophylaxis: SCD  Code Status: Full Code Family Communication: no family  bedside Disposition Plan: remain inpatient   Consultants:  GI  Oncology  General Surgery   Procedures:  None    Antibiotics None    Studies  1. Dg Abd 1 View 10/24/2014 NG tube tip projects over the stomach.  Distention of colon to the level of the sigmoid stent, in keeping with delayed transit / near complete obstruction. 2. Ct Abdomen Pelvis W Contrast 10/24/2014 1. 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 2. Minimal free peritoneal fluid. 3. 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 4. Treated metastasis in the left lobe of the liver. 5. Previously demonstrated hemangiomas in the right lobe of the liver and probable small liver cysts. 6. Stable small probable metastatic left para-aortic lymph node.   3. Dg Abd Acute W/chest 10/24/2014 A presumed colonic stent overlies the left hemipelvis. This is associated with moderate upstream distention of the more proximal colon without definitive distention of the small bowel. While potentially representative an ileus, on early distal colonic obstruction could have a similar appearance. Clinical correlation is advised.  Objective  Filed Vitals:   10/25/14 0543 10/25/14 1503 10/25/14 2200 10/26/14 0515  BP: 134/82 137/72 131/60 125/58  Pulse: 92 83 76 73  Temp: 98.4 F (36.9 C) 98.2 F (36.8 C) 98 F (36.7 C) 97.8 F (36.6 C)  TempSrc: Oral  Oral Oral Oral  Resp: 16 16 16 16   Weight: 69.5 kg (153 lb 3.5 oz)   70.1 kg (154 lb 8.7 oz)  SpO2: 98% 98% 97% 98%    Intake/Output Summary (Last 24 hours) at 10/26/14 0930 Last data filed at 10/26/14 6222  Gross per 24 hour  Intake 1377.5 ml  Output    200 ml  Net 1177.5 ml   Filed Weights   10/24/14 2300 10/25/14 0543 10/26/14 0515   Weight: 69.5 kg (153 lb 3.5 oz) 69.5 kg (153 lb 3.5 oz) 70.1 kg (154 lb 8.7 oz)    Exam:  General:  NAD, NG tube in place  HEENT: no scleral icterus, PERRL  Cardiovascular: RRR without MRG, 2+ peripheral pulses, no edema  Respiratory: CTA biL, good air movement, no wheezing, no crackles, no rales  Abdomen: soft, mild tenderness to palpation, mild distention   MSK/Extremities: no clubbing/cyanosis, no joint swelling  Skin: no rashes  Neuro: non focal  Data Reviewed: Basic Metabolic Panel:  Recent Labs Lab 10/24/14 1444 10/25/14 0555 10/26/14 1010  NA 138 136 137  K 3.8 3.2* 3.2*  CL 108 107 106  CO2 21 23 23   GLUCOSE 97 125* 157*  BUN 13 10 11   CREATININE 0.69 0.62 0.59  CALCIUM 11.0* 9.8 10.1   Liver Function Tests:  Recent Labs Lab 10/24/14 1444  AST 19  ALT 11  ALKPHOS 72  BILITOT 0.1*  PROT 8.5*  ALBUMIN 3.7   CBC:  Recent Labs Lab 10/24/14 1444 10/25/14 0555  WBC 6.6 6.1  NEUTROABS 4.2  --   HGB 11.4* 10.8*  HCT 35.2* 33.8*  MCV 84.8 86.0  PLT 288 280    Scheduled Meds: . antiseptic oral rinse  7 mL Mouth Rinse q12n4p  . chlorhexidine  15 mL Mouth Rinse BID   Continuous Infusions: . dextrose 5 % and 0.45% NaCl 1,000 mL with potassium chloride 20 mEq infusion 75 mL/hr at 10/26/14 0105   Marzetta Board, MD [facility] Pager [phone]. If 7 PM - 7 AM, please contact night-coverage at [email or web address], password [facility] 10/26/2014, 9:30 AM  LOS: 2 days

## 2014-10-27 LAB — COMPREHENSIVE METABOLIC PANEL
ALT: 8 U/L (ref 0–35)
AST: 12 U/L (ref 0–37)
Albumin: 3 g/dL — ABNORMAL LOW (ref 3.5–5.2)
Alkaline Phosphatase: 53 U/L (ref 39–117)
Anion gap: 5 (ref 5–15)
BUN: 11 mg/dL (ref 6–23)
CALCIUM: 9.9 mg/dL (ref 8.4–10.5)
CO2: 27 mmol/L (ref 19–32)
CREATININE: 0.63 mg/dL (ref 0.50–1.10)
Chloride: 104 mmol/L (ref 96–112)
GFR calc Af Amer: >90 mL/min (ref >90)
GFR calc non Af Amer: >90 mL/min (ref >90)
GLUCOSE: 130 mg/dL — AB (ref 70–99)
POTASSIUM: 2.9 mmol/L — AB (ref 3.5–5.1)
SODIUM: 136 mmol/L (ref 135–145)
Total Bilirubin: 0.2 mg/dL — ABNORMAL LOW (ref 0.3–1.2)
Total Protein: 6.9 g/dL (ref 6.0–8.3)

## 2014-10-27 LAB — CBC
HEMATOCRIT: 32.6 % — AB (ref 36.0–46.0)
Hemoglobin: 10.4 g/dL — ABNORMAL LOW (ref 12.0–15.0)
MCH: 27.2 pg (ref 26.0–34.0)
MCHC: 31.9 g/dL (ref 30.0–36.0)
MCV: 85.3 fL (ref 78.0–100.0)
PLATELETS: 326 10*3/uL (ref 150–400)
RBC: 3.82 MIL/uL — ABNORMAL LOW (ref 3.87–5.11)
RDW: 13 % (ref 11.5–15.5)
WBC: 6 10*3/uL (ref 4.0–10.5)

## 2014-10-27 LAB — IRON AND TIBC
Iron: 36 ug/dL — ABNORMAL LOW (ref 42–145)
Saturation Ratios: 12 % — ABNORMAL LOW (ref 20–55)
TIBC: 304 ug/dL (ref 250–470)
UIBC: 268 ug/dL (ref 125–400)

## 2014-10-27 LAB — RETICULOCYTES
RBC.: 3.85 MIL/uL — ABNORMAL LOW (ref 3.87–5.11)
RETIC CT PCT: 1.3 % (ref 0.4–3.1)
Retic Count, Absolute: 50.1 10*3/uL (ref 19.0–186.0)

## 2014-10-27 LAB — VITAMIN B12: Vitamin B-12: 401 pg/mL (ref 211–911)

## 2014-10-27 LAB — FERRITIN: Ferritin: 28 ng/mL (ref 10–291)

## 2014-10-27 LAB — FOLATE: Folate: 6.3 ng/mL

## 2014-10-27 MED ORDER — POTASSIUM CHLORIDE 10 MEQ/100ML IV SOLN
10.0000 meq | INTRAVENOUS | Status: AC
  Administered 2014-10-27 (×5): 10 meq via INTRAVENOUS
  Filled 2014-10-27 (×5): qty 100

## 2014-10-27 MED ORDER — PROMETHAZINE HCL 25 MG PO TABS: 12.5000 mg | ORAL_TABLET | Freq: Four times a day (QID) | ORAL | Status: DC | PRN

## 2014-10-27 MED ORDER — MAGNESIUM SULFATE 2 GM/50ML IV SOLN
2.0000 g | Freq: Once | INTRAVENOUS | Status: AC
  Administered 2014-10-27: 2 g via INTRAVENOUS
  Filled 2014-10-27: qty 50

## 2014-10-27 MED ORDER — POTASSIUM CHLORIDE 2 MEQ/ML IV SOLN
INTRAVENOUS | Status: DC
  Administered 2014-10-27 – 2014-10-30 (×4): via INTRAVENOUS
  Filled 2014-10-27 (×9): qty 1000

## 2014-10-27 MED ORDER — SODIUM CHLORIDE 0.9 % IV SOLN
8.0000 mg | Freq: Four times a day (QID) | INTRAVENOUS | Status: DC | PRN
  Filled 2014-10-27: qty 4

## 2014-10-27 MED ORDER — ONDANSETRON HCL 4 MG PO TABS: 4.0000 mg | ORAL_TABLET | Freq: Four times a day (QID) | ORAL | Status: DC | PRN

## 2014-10-27 MED ORDER — PROMETHAZINE HCL 25 MG/ML IJ SOLN
12.5000 mg | Freq: Four times a day (QID) | INTRAMUSCULAR | Status: DC | PRN
  Administered 2014-10-27 – 2014-10-28 (×2): 12.5 mg via INTRAVENOUS
  Filled 2014-10-27 (×2): qty 1

## 2014-10-27 MED ORDER — PROMETHAZINE HCL 25 MG RE SUPP: 12.5000 mg | Freq: Four times a day (QID) | RECTAL | Status: DC | PRN

## 2014-10-27 NOTE — Progress Notes (Signed)
General Surgery Note  LOS: 3 days  POD -     Assessment/Plan: 1. 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  Followed by Dr. Burr Medico  She had sigmoid colon stent placed by Dr. Carlean Purl on 08/20/2014.  Dr. Zella Richer is our Surgeon of the week this week and he knows her from her original evaluation.  Will discuss with him about surgery.  Timing of the surgery will be decided by him.  She may need urology involved if there is an attempt to remove the cancer in order to place ureteral stents.  Discussed with patient.  Keep NGT.  Needs to ambulate more.  2. Indeterminate left lower lung nodule - 5 mm. 3. History of anxiety.   Principal Problem:   Colon obstruction Active Problems:   Colorectal cancer, stage IV   Nausea with vomiting   Bowel obstruction   Anxiety   Subjective:  Doing okay.    She is having some BM's.  Still has NGT.  Three cousins and her mother in the room with her. Objective:   Filed Vitals:   10/27/14 0554  BP: 124/64  Pulse: 67  Temp: 98 F (36.7 C)  Resp: 20     Intake/Output from previous day:  04/23 0701 - 04/24 0700 In: 804 [I.V.:804] Out: 450 [Emesis/NG output:450]  Intake/Output this shift:      Physical Exam:   General: WN AA F who is alert and oriented.    HEENT: Normal. Pupils equal. .   Lungs: Clear.   Abdomen: Distended.  Not tender.   Lab Results:    Recent Labs  10/25/14 0555 10/27/14 0518  WBC 6.1 6.0  HGB 10.8* 10.4*  HCT 33.8* 32.6*  PLT 280 326    BMET   Recent Labs  10/26/14 1010 10/27/14 0518  NA 137 136  K 3.2* 2.9*  CL 106 104  CO2 23 27  GLUCOSE 157* 130*  BUN 11 11  CREATININE 0.59 0.63  CALCIUM 10.1 9.9    PT/INR  No results for input(s): LABPROT, INR in the last 72 hours.  ABG  No results for input(s): PHART, HCO3 in the last 72 hours.  Invalid  input(s): PCO2, PO2   Studies/Results:  No results found.   Anti-infectives:   Anti-infectives    None      Alphonsa Overall, MD, FACS Pager: [phone] Surgery Office: [phone] 10/27/2014

## 2014-10-27 NOTE — Progress Notes (Addendum)
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 Over the last 3 days, she has had progressive worsening upper abd pain with increased distention and nausea, vomiting bilious material. Minimal flatus. No fevers. Ct scan shows tumor growth into/surrounding the stent causing obstruction. For surgery on 4.25.2016.   Assessment/Plan: Stage IV colon cancer with recurrent colonic obstruction: - CT scan of the abdomen and pelvis showed sigmoid malignancy that has grown into the lumen of the stent, with no perforation. Surgery was consulted and recommended surgical intervention on 10/28/2014.  Nausea and vomiting: Due to recurrent obstruction, NG tube in place.  Hypokalemia: Due to nausea and vomiting, will replete IV check a magnesium level.  Normocytic anemia: Check a ferritin level.  Code Status: Full Code Family Communication: no family bedside Disposition Plan: remain inpatient   Consultants:  GI  Oncology  General Surgery  Procedures:  None   Antibiotics None    Studies  Dg Abd 1 View 10/24/2014 NG tube tip projects over the stomach.   Ct Abdomen Pelvis W Contrast 10/24/2014 1. 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 2. Minimal free peritoneal fluid. 3. 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 4. Treated metastasis in the left lobe of the liver. 5. Previously demonstrated hemangiomas in the right lobe of the liver and probable small liver cysts. 6. Stable small probable  metastatic left para-aortic lymph node.  3. Dg Abd Acute W/chest 10/24/2014 A presumed colonic stent overlies the left hemipelvis. This is associated with moderate upstream distention of the more proximal colon without definitive distention of the small bowel. While potentially representative an ileus, on early distal colonic obstruction could have a similar appearance. Clinical correlation is advised.  HPI/Subjective: No complains  Objective: Filed Vitals:   10/26/14 0515 10/26/14 1456 10/26/14 2124 10/27/14 0554  BP: 125/58 102/64 127/56 124/64  Pulse: 73 88 76 67  Temp: 97.8 F (36.6 C) 98 F (36.7 C) 98.5 F (36.9 C) 98 F (36.7 C)  TempSrc: Oral Oral Oral Oral  Resp: 16 20 20 20   Weight: 70.1 kg (154 lb 8.7 oz)   69.9 kg (154 lb 1.6 oz)  SpO2: 98% 100% 97% 100%    Intake/Output Summary (Last 24 hours) at 10/27/14 0909 Last data filed at 10/27/14 0555  Gross per 24 hour  Intake    804 ml  Output    350 ml  Net    454 ml   Filed Weights   10/25/14 0543 10/26/14 0515 10/27/14 0554  Weight: 69.5 kg (153 lb 3.5 oz) 70.1 kg (154 lb 8.7 oz) 69.9 kg (154 lb 1.6 oz)    Exam:  General: Alert, awake, oriented x3, in no acute distress.  HEENT: No bruits, no goiter.  Heart: Regular rate and rhythm. Lungs: Good air movement, clear Abdomen: Soft, nontender, nondistended, NG in place. Neuro: Grossly intact, nonfocal.   Data Reviewed: Basic Metabolic Panel:  Recent Labs Lab 10/24/14 1444 10/25/14 0555 10/26/14 1010 10/27/14 0518  NA 138 136 137 136  K 3.8 3.2* 3.2* 2.9*  CL 108 107 106 104  CO2 21 23 23 27   GLUCOSE 97 125* 157* 130*  BUN 13 10 11 11   CREATININE 0.69 0.62 0.59 0.63  CALCIUM 11.0* 9.8 10.1 9.9   Liver Function Tests:  Recent Labs Lab 10/24/14 1444 10/27/14 0518  AST 19 12  ALT 11 8  ALKPHOS 72 53  BILITOT 0.1* 0.2*  PROT 8.5* 6.9  ALBUMIN 3.7 3.0*   No results for input(s): LIPASE, AMYLASE in the last 168 hours. No results for input(s):  AMMONIA in the last 168 hours. CBC:  Recent Labs Lab 10/24/14 1444 10/25/14 0555 10/27/14 0518  WBC 6.6 6.1 6.0  NEUTROABS 4.2  --   --   HGB 11.4* 10.8* 10.4*  HCT 35.2* 33.8* 32.6*  MCV 84.8 86.0 85.3  PLT 288 280 326   Cardiac Enzymes: No results for input(s): CKTOTAL, CKMB, CKMBINDEX, TROPONINI in the last 168 hours. BNP (last 3 results) No results for input(s): BNP in the last 8760 hours.  ProBNP (last 3 results) No results for input(s): PROBNP in the last 8760 hours.  CBG: No results for input(s): GLUCAP in the last 168 hours.  No results found for this or any previous visit (from the past 240 hour(s)).   Studies: No results found.  Scheduled Meds: . antiseptic oral rinse  7 mL Mouth Rinse q12n4p  . chlorhexidine  15 mL Mouth Rinse BID   Continuous Infusions: . dextrose 5 % and 0.45% NaCl 1,000 mL with potassium chloride 20 mEq infusion 75 mL/hr at 10/27/14 0555    Time Spent: 15 min   Charlynne Cousins  [facility] Pager [phone]. If 7PM-7AM, please contact night-coverage at [email or web address], password [facility] 10/27/2014, 9:09 AM  LOS: 3 days

## 2014-10-28 ENCOUNTER — Inpatient Hospital Stay (HOSPITAL_COMMUNITY): Payer: Medicaid Other | Admitting: Registered Nurse

## 2014-10-28 ENCOUNTER — Encounter (HOSPITAL_COMMUNITY)
Admission: EM | Disposition: A | Discharge status: Home Health | Payer: Self-pay | Source: Home / Self Care | Attending: Internal Medicine

## 2014-10-28 DIAGNOSIS — K5660 Unspecified intestinal obstruction: Secondary | ICD-10-CM

## 2014-10-28 HISTORY — PX: COLON RESECTION: SHX5231

## 2014-10-28 LAB — CBC WITH DIFFERENTIAL/PLATELET
BASOS PCT: 0 % (ref 0–1)
Basophils Absolute: 0 10*3/uL (ref 0.0–0.1)
EOS PCT: 0 % (ref 0–5)
Eosinophils Absolute: 0 10*3/uL (ref 0.0–0.7)
HCT: 35.7 % — ABNORMAL LOW (ref 36.0–46.0)
HEMOGLOBIN: 11.5 g/dL — AB (ref 12.0–15.0)
LYMPHS ABS: 1.1 10*3/uL (ref 0.7–4.0)
Lymphocytes Relative: 16 % (ref 12–46)
MCH: 27.4 pg (ref 26.0–34.0)
MCHC: 32.2 g/dL (ref 30.0–36.0)
MCV: 85.2 fL (ref 78.0–100.0)
Monocytes Absolute: 0.7 10*3/uL (ref 0.1–1.0)
Monocytes Relative: 10 % (ref 3–12)
Neutro Abs: 5 10*3/uL (ref 1.7–7.7)
Neutrophils Relative %: 74 % (ref 43–77)
PLATELETS: 298 10*3/uL (ref 150–400)
RBC: 4.19 MIL/uL (ref 3.87–5.11)
RDW: 13 % (ref 11.5–15.5)
WBC: 6.8 10*3/uL (ref 4.0–10.5)

## 2014-10-28 LAB — MAGNESIUM: Magnesium: 2.6 mg/dL — ABNORMAL HIGH (ref 1.5–2.5)

## 2014-10-28 LAB — SURGICAL PCR SCREEN
MRSA, PCR: NEGATIVE
Staphylococcus aureus: POSITIVE — AB

## 2014-10-28 LAB — BASIC METABOLIC PANEL
Anion gap: 8 (ref 5–15)
BUN: 11 mg/dL (ref 6–23)
CALCIUM: 9.6 mg/dL (ref 8.4–10.5)
CO2: 25 mmol/L (ref 19–32)
CREATININE: 0.71 mg/dL (ref 0.50–1.10)
Chloride: 106 mmol/L (ref 96–112)
GFR calc Af Amer: >90 mL/min (ref >90)
GFR calc non Af Amer: >90 mL/min (ref >90)
GLUCOSE: 120 mg/dL — AB (ref 70–99)
Potassium: 3.8 mmol/L (ref 3.5–5.1)
SODIUM: 139 mmol/L (ref 135–145)

## 2014-10-28 LAB — TYPE AND SCREEN
ABO/RH(D): AB POS
Antibody Screen: NEGATIVE

## 2014-10-28 LAB — ABO/RH: ABO/RH(D): AB POS

## 2014-10-28 SURGERY — COLON RESECTION
Anesthesia: General | Site: Abdomen

## 2014-10-28 MED ORDER — DEXAMETHASONE SODIUM PHOSPHATE 10 MG/ML IJ SOLN
INTRAMUSCULAR | Status: DC | PRN
  Administered 2014-10-28: 10 mg via INTRAVENOUS

## 2014-10-28 MED ORDER — MIDAZOLAM HCL 2 MG/2ML IJ SOLN
INTRAMUSCULAR | Status: AC
  Filled 2014-10-28: qty 2

## 2014-10-28 MED ORDER — HYDROMORPHONE HCL 1 MG/ML IJ SOLN
INTRAMUSCULAR | Status: AC
  Filled 2014-10-28: qty 2

## 2014-10-28 MED ORDER — DEXTROSE 5 % IV SOLN
2.0000 g | Freq: Once | INTRAVENOUS | Status: AC
  Administered 2014-10-28: 2 g via INTRAVENOUS

## 2014-10-28 MED ORDER — FENTANYL CITRATE (PF) 100 MCG/2ML IJ SOLN
INTRAMUSCULAR | Status: DC | PRN
  Administered 2014-10-28 (×3): 50 ug via INTRAVENOUS

## 2014-10-28 MED ORDER — HYDROMORPHONE HCL 1 MG/ML IJ SOLN
0.2500 mg | INTRAMUSCULAR | Status: DC | PRN
Start: 2014-10-28 — End: 2014-10-31
  Administered 2014-10-28 (×4): 0.5 mg via INTRAVENOUS

## 2014-10-28 MED ORDER — LACTATED RINGERS IV SOLN
INTRAVENOUS | Status: DC
  Administered 2014-10-28: 1000 mL via INTRAVENOUS

## 2014-10-28 MED ORDER — MIDAZOLAM HCL 5 MG/5ML IJ SOLN
INTRAMUSCULAR | Status: DC | PRN
  Administered 2014-10-28: 1 mg via INTRAVENOUS

## 2014-10-28 MED ORDER — HYDROMORPHONE HCL 1 MG/ML IJ SOLN
INTRAMUSCULAR | Status: AC
  Filled 2014-10-28: qty 1

## 2014-10-28 MED ORDER — GLYCOPYRROLATE 0.2 MG/ML IJ SOLN
INTRAMUSCULAR | Status: DC | PRN
  Administered 2014-10-28: .6 mg via INTRAVENOUS

## 2014-10-28 MED ORDER — PROPOFOL 10 MG/ML IV BOLUS
INTRAVENOUS | Status: DC | PRN
  Administered 2014-10-28: 150 mg via INTRAVENOUS

## 2014-10-28 MED ORDER — LIP MEDEX EX OINT
TOPICAL_OINTMENT | CUTANEOUS | Status: AC
  Filled 2014-10-28: qty 7

## 2014-10-28 MED ORDER — FENTANYL CITRATE (PF) 250 MCG/5ML IJ SOLN
INTRAMUSCULAR | Status: AC
  Filled 2014-10-28: qty 5

## 2014-10-28 MED ORDER — EPHEDRINE SULFATE 50 MG/ML IJ SOLN
INTRAMUSCULAR | Status: AC
  Filled 2014-10-28: qty 1

## 2014-10-28 MED ORDER — NEOSTIGMINE METHYLSULFATE 10 MG/10ML IV SOLN
INTRAVENOUS | Status: DC | PRN
  Administered 2014-10-28: 4 mg via INTRAVENOUS

## 2014-10-28 MED ORDER — LIDOCAINE HCL (CARDIAC) 20 MG/ML IV SOLN
INTRAVENOUS | Status: AC
  Filled 2014-10-28: qty 5

## 2014-10-28 MED ORDER — DEXAMETHASONE SODIUM PHOSPHATE 10 MG/ML IJ SOLN
INTRAMUSCULAR | Status: AC
  Filled 2014-10-28: qty 1

## 2014-10-28 MED ORDER — GLYCOPYRROLATE 0.2 MG/ML IJ SOLN
INTRAMUSCULAR | Status: AC
  Filled 2014-10-28: qty 3

## 2014-10-28 MED ORDER — ROCURONIUM BROMIDE 100 MG/10ML IV SOLN
INTRAVENOUS | Status: DC | PRN
  Administered 2014-10-28: 30 mg via INTRAVENOUS

## 2014-10-28 MED ORDER — NEOSTIGMINE METHYLSULFATE 10 MG/10ML IV SOLN
INTRAVENOUS | Status: AC
  Filled 2014-10-28: qty 1

## 2014-10-28 MED ORDER — ONDANSETRON HCL 4 MG/2ML IJ SOLN
INTRAMUSCULAR | Status: DC | PRN
  Administered 2014-10-28: 4 mg via INTRAVENOUS

## 2014-10-28 MED ORDER — SODIUM CHLORIDE 0.9 % IJ SOLN
INTRAMUSCULAR | Status: AC
  Filled 2014-10-28: qty 10

## 2014-10-28 MED ORDER — ROCURONIUM BROMIDE 100 MG/10ML IV SOLN
INTRAVENOUS | Status: AC
  Filled 2014-10-28: qty 1

## 2014-10-28 MED ORDER — 0.9 % SODIUM CHLORIDE (POUR BTL) OPTIME
TOPICAL | Status: DC | PRN
  Administered 2014-10-28: 1000 mL

## 2014-10-28 MED ORDER — PROPOFOL 10 MG/ML IV BOLUS
INTRAVENOUS | Status: AC
  Filled 2014-10-28: qty 20

## 2014-10-28 MED ORDER — SUCCINYLCHOLINE CHLORIDE 20 MG/ML IJ SOLN
INTRAMUSCULAR | Status: DC | PRN
  Administered 2014-10-28: 50 mg via INTRAVENOUS

## 2014-10-28 MED ORDER — LIDOCAINE HCL (CARDIAC) 20 MG/ML IV SOLN
INTRAVENOUS | Status: DC | PRN
  Administered 2014-10-28: 80 mg via INTRAVENOUS

## 2014-10-28 MED ORDER — ONDANSETRON HCL 4 MG/2ML IJ SOLN
INTRAMUSCULAR | Status: AC
  Filled 2014-10-28: qty 2

## 2014-10-28 MED ORDER — CEFOTETAN DISODIUM-DEXTROSE 2-2.08 GM-% IV SOLR
INTRAVENOUS | Status: AC
  Filled 2014-10-28: qty 50

## 2014-10-28 SURGICAL SUPPLY — 49 items
APPLICATOR COTTON TIP 6IN STRL (MISCELLANEOUS) ×2 IMPLANT
BAG UROSTOMY 4 LOOP 90 STRL (OSTOMY) ×1 IMPLANT
BAG UROSTOMY 4 LOOP 90MM STER (OSTOMY) ×1
BLADE EXTENDED COATED 6.5IN (ELECTRODE) ×1 IMPLANT
BLADE HEX COATED 2.75 (ELECTRODE) ×3 IMPLANT
CATH ROBINSON RED A/P 16FR (CATHETERS) ×2 IMPLANT
CLIP TI LARGE 6 (CLIP) ×1 IMPLANT
DRAIN CHANNEL RND F F (WOUND CARE) IMPLANT
DRAIN PENROSE 18X1/2 LTX STRL (DRAIN) ×2 IMPLANT
DRAPE INCISE IOBAN 66X45 STRL (DRAPES) IMPLANT
DRAPE LAPAROSCOPIC ABDOMINAL (DRAPES) ×3 IMPLANT
DRAPE UTILITY XL STRL (DRAPES) ×3 IMPLANT
ELECT REM PT RETURN 9FT ADLT (ELECTROSURGICAL) ×3
ELECTRODE REM PT RTRN 9FT ADLT (ELECTROSURGICAL) ×1 IMPLANT
EVACUATOR SILICONE 100CC (DRAIN) IMPLANT
GAUZE SPONGE 4X4 12PLY STRL (GAUZE/BANDAGES/DRESSINGS) ×3 IMPLANT
GLOVE BIOGEL PI IND STRL 7.0 (GLOVE) ×1 IMPLANT
GLOVE BIOGEL PI INDICATOR 7.0 (GLOVE) ×2
GLOVE ECLIPSE 8.0 STRL XLNG CF (GLOVE) ×6 IMPLANT
GLOVE INDICATOR 8.0 STRL GRN (GLOVE) ×3 IMPLANT
GOWN STRL REUS W/TWL LRG LVL3 (GOWN DISPOSABLE) ×3 IMPLANT
GOWN STRL REUS W/TWL XL LVL3 (GOWN DISPOSABLE) ×6 IMPLANT
LEGGING LITHOTOMY PAIR STRL (DRAPES) IMPLANT
LIGASURE IMPACT 36 18CM CVD LR (INSTRUMENTS) ×3 IMPLANT
NS IRRIG 1000ML POUR BTL (IV SOLUTION) ×6 IMPLANT
PACK COLON (CUSTOM PROCEDURE TRAY) ×3 IMPLANT
PACK GENERAL/GYN (CUSTOM PROCEDURE TRAY) ×3 IMPLANT
SEALER TISSUE X1 CVD JAW (INSTRUMENTS) IMPLANT
SHEARS HARMONIC ACE PLUS 36CM (ENDOMECHANICALS) IMPLANT
STAPLER VISISTAT 35W (STAPLE) ×1 IMPLANT
SUT ETHILON 4 0 PS 2 18 (SUTURE) ×2 IMPLANT
SUT NOV 1 T60/GS (SUTURE) IMPLANT
SUT NOVA NAB DX-16 0-1 5-0 T12 (SUTURE) IMPLANT
SUT NOVA T20/GS 25 (SUTURE) IMPLANT
SUT PDS AB 1 CTX 36 (SUTURE) ×2 IMPLANT
SUT SILK 2 0 (SUTURE) ×3
SUT SILK 2 0SH CR/8 30 (SUTURE) ×3 IMPLANT
SUT SILK 2-0 18XBRD TIE 12 (SUTURE) ×1 IMPLANT
SUT SILK 3 0 (SUTURE) ×3
SUT SILK 3 0 SH CR/8 (SUTURE) ×3 IMPLANT
SUT SILK 3-0 18XBRD TIE 12 (SUTURE) ×1 IMPLANT
SUT VIC AB 2-0 SH 18 (SUTURE) ×1 IMPLANT
SUT VIC AB 3-0 SH 18 (SUTURE) ×1 IMPLANT
SUT VIC AB 3-0 SH 8-18 (SUTURE) ×4 IMPLANT
SUT VICRYL 2 0 18  UND BR (SUTURE)
SUT VICRYL 2 0 18 UND BR (SUTURE) ×1 IMPLANT
SYRINGE IRR TOOMEY STRL 70CC (SYRINGE) ×2 IMPLANT
TRAY FOLEY W/METER SILVER 14FR (SET/KITS/TRAYS/PACK) ×3 IMPLANT
YANKAUER SUCT BULB TIP NO VENT (SUCTIONS) ×3 IMPLANT

## 2014-10-28 NOTE — Op Note (Signed)
Operative Note  [name] female 64 y.o. 10/28/2014  PREOPERATIVE DX:  Stage IV obstructing rectosigmoid colon cancer  POSTOPERATIVE DX:  Same  PROCEDURE:   Transverse loop colostomy         Surgeon: [name]   Assistants: none  Anesthesia: General endotracheal anesthesia  Indications:   This is a 64 year old female with stage IV rectosigmoid colon cancer. She's been undergoing chemotherapy. She has a stent in the rectosigmoid colon area. The tumor is now growing into the stent and causing an obstruction. 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 She now presents for palliative transverse loop colostomy.    Procedure Detail:  She was brought to the operating room placed supine on the operating table general anesthetic was given. The abdomen was massively distended and firm. The abdominal wall was widely sterilely prepped and draped. A limited transverse incision was made in the right upper quadrant through the skin subcutaneous tissue fascia and peritoneum. Initially, small bowel K milder room. There is one whitish discoloration of the small bowel which did not look like a Bovie burn but I decided to go ahead and do a single 3-0 silk Lembert suture at that area. I then placed the small bowel back into the incision. The transverse colon was massively dilated. Using blunt dissection, I was able to work it up into the incision. A small mesenteric defect was created. A 16 French red rubber catheter was used as a loop colostomy bar. I then made a transverse incision in the colon and a large amount of gas escaped. The colostomy was then matured by sewing it to the skin with interrupted 3-0 Vicryl sutures. The red rubber catheter colostomy bar was anchored to the skin with 4-0 nylon sutures. A large colostomy appliance was applied.  She tolerated the procedure well  without any apparent complications and was taken recovery room in satisfactory condition.  Estimated Blood Loss:  100 ml               Complications:  * No complications entered in OR log *         Disposition: PACU - hemodynamically stable.         Condition: stable

## 2014-10-28 NOTE — Transfer of Care (Signed)
Immediate Anesthesia Transfer of Care Note  Patient: [name] [name]  Procedure(s) Performed: Procedure(s): Transverse loop colostomy (N/A)  Patient Location: PACU  Anesthesia Type:General  Level of Consciousness: awake, alert , oriented and patient cooperative  Airway & Oxygen Therapy: Patient Spontanous Breathing and Patient connected to face mask oxygen  Post-op Assessment: Report given to RN, Post -op Vital signs reviewed and stable and Patient moving all extremities  Post vital signs: Reviewed and stable  Last Vitals:  Filed Vitals:   10/28/14 0946  BP: 122/57  Pulse: 116  Temp: 36.8 C  Resp: 18    Complications: No apparent anesthesia complications

## 2014-10-28 NOTE — Consult Note (Signed)
WOC ostomy follow up: Patient seen in PACU immediately post op due to leaking.  Pouching attempted x2 before a seal could be achieved. Stoma type/location: RUQ loop transverse colostomy Stomal assessment/size: 3 and 1/2 inches oval with red rubber catheter bridge evident at 12 and 6 o'clock. Peristomal assessment: intact, clear Treatment options for stomal/peristomal skin: skin barrier rings.  Two are cut and used to encase ostomy. Output liquid brown effluent Ostomy pouching: 2pc., 4-inch pouching system with skin barrier rings placed prior to pouching.  First leakage occurred from 9 o'clock position where skin barrier rings joined.  Second attempt is intact after 30 minutes. Education provided: none Enrolled patient in [employer] Discharge program: No WOC nursing team will follow, and will remain available to this patient, the nursing, surgical and medical teams.  Please re-consult if needed. Thanks, Maudie Flakes, MSN, RN, [place], [place], [place] [phone])

## 2014-10-28 NOTE — Progress Notes (Signed)
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 Over the last 3 days, she has had progressive worsening upper abd pain with increased distention and nausea, vomiting bilious material. Minimal flatus. No fevers. Ct scan shows tumor growth into/surrounding the stent causing obstruction. For surgery on 4.25.2016.   Assessment/Plan: Stage IV colon cancer with recurrent colonic obstruction: - CT scan of the abdomen and pelvis showed sigmoid malignancy that has grown into the lumen of the stent, with no perforation. -Patient taken to the OR on 10/28/2014 undergoing transverse loop colostomy, Asian tolerated procedure well there are no immediate complications. -NG tube in place, patient is currently nothing by mouth -Continue pain management with Dilaudid 1 mg IV every 2 hours  Nausea and vomiting: -Due to recurrent obstruction, NG tube in place. -She was taken to the OR today.   Hypokalemia: A.m. labs showing potassium level of 3.8  Normocytic anemia: Check a ferritin level.  Code Status: Full Code Family Communication: I spoke with her daughter who was present at bedside Disposition Plan: remain inpatient   Consultants:  GI  Oncology  General Surgery  Procedures:  None   Antibiotics None    Studies  Dg Abd 1 View 10/24/2014 NG tube tip projects over the stomach.   Ct Abdomen Pelvis W Contrast 10/24/2014 1. 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 2. Minimal free peritoneal fluid. 3. Slow  growth of a probable metastasis in the spleen. 4. Treated metastasis in the left lobe of the liver. 5. Previously demonstrated hemangiomas in the right lobe of the liver and probable small liver cysts. 6. Stable small probable metastatic left para-aortic lymph node.  3. Dg Abd Acute W/chest 10/24/2014 A presumed colonic stent overlies the left hemipelvis. This is associated with moderate upstream distention of the more proximal colon without definitive distention of the small bowel. While potentially representative an ileus, on early distal colonic obstruction could have a similar appearance. Clinical correlation is advised.  HPI/Subjective: Patient being seen postoperatively, NG tube in place, states pain is controlled.  Objective: Filed Vitals:   10/28/14 1455 10/28/14 1500 10/28/14 1505 10/28/14 1540  BP:  128/63  118/53  Pulse: 87 88 81 83  Temp:  98.9 F (37.2 C)  98.2 F (36.8 C)  TempSrc:      Resp: 13 13 12 20   Height:      Weight:      SpO2: 99% 99% 100% 100%    Intake/Output Summary (Last 24 hours) at 10/28/14 1929 Last data filed at 10/28/14 1503  Gross per 24 hour  Intake   2200 ml  Output    845 ml  Net   1355 ml   Filed Weights   10/26/14 0515 10/27/14 0554 10/28/14 0545  Weight: 70.1 kg (154 lb 8.7 oz) 69.9 kg (154 lb 1.6 oz) 70.7 kg (155 lb 13.8 oz)    Exam:  General: Alert, awake, oriented x3, in no acute distress.  HEENT: No bruits, no goiter.  Heart: Regular rate and rhythm. Lungs: Good air movement, clear Abdomen: Soft,  nontender, nondistended, NG in place. Status post colostomy placement Neuro: Grossly intact, nonfocal.   Data Reviewed: Basic Metabolic Panel:  Recent Labs Lab 10/24/14 1444 10/25/14 0555 10/26/14 1010 10/27/14 0518 10/28/14 0600 10/28/14 1445  NA 138 136 137 136  --  139  K 3.8 3.2* 3.2* 2.9*  --  3.8  CL 108 107 106 104  --  106  CO2 21 23 23 27   --  25  GLUCOSE 97 125* 157* 130*  --  120*  BUN 13 10 11 11   --  11   CREATININE 0.69 0.62 0.59 0.63  --  0.71  CALCIUM 11.0* 9.8 10.1 9.9  --  9.6  MG  --   --   --   --  2.6*  --    Liver Function Tests:  Recent Labs Lab 10/24/14 1444 10/27/14 0518  AST 19 12  ALT 11 8  ALKPHOS 72 53  BILITOT 0.1* 0.2*  PROT 8.5* 6.9  ALBUMIN 3.7 3.0*   No results for input(s): LIPASE, AMYLASE in the last 168 hours. No results for input(s): AMMONIA in the last 168 hours. CBC:  Recent Labs Lab 10/24/14 1444 10/25/14 0555 10/27/14 0518 10/28/14 0600  WBC 6.6 6.1 6.0 6.8  NEUTROABS 4.2  --   --  5.0  HGB 11.4* 10.8* 10.4* 11.5*  HCT 35.2* 33.8* 32.6* 35.7*  MCV 84.8 86.0 85.3 85.2  PLT 288 280 326 298   Cardiac Enzymes: No results for input(s): CKTOTAL, CKMB, CKMBINDEX, TROPONINI in the last 168 hours. BNP (last 3 results) No results for input(s): BNP in the last 8760 hours.  ProBNP (last 3 results) No results for input(s): PROBNP in the last 8760 hours.  CBG: No results for input(s): GLUCAP in the last 168 hours.  Recent Results (from the past 240 hour(s))  Surgical pcr screen     Status: Abnormal   Collection Time: 10/28/14  9:34 AM  Result Value Ref Range Status   MRSA, PCR NEGATIVE NEGATIVE Final   Staphylococcus aureus POSITIVE (A) NEGATIVE Final    Comment:        The Xpert SA Assay (FDA approved for NASAL specimens in patients over 60 years of age), is one component of a comprehensive surveillance program.  Test performance has been validated by [facility] for patients greater than or equal to 76 year old. It is not intended to diagnose infection nor to guide or monitor treatment.      Studies: No results found.  Scheduled Meds: . antiseptic oral rinse  7 mL Mouth Rinse q12n4p  . chlorhexidine  15 mL Mouth Rinse BID  . HYDROmorphone      . HYDROmorphone      . lip balm       Continuous Infusions: . dextrose 5 % and 0.45% NaCl 1,000 mL with potassium chloride 40 mEq infusion 75 mL/hr at 10/28/14 1553    Time Spent:  15 min   Kelvin Cellar  [facility] Pager [phone]. If 7PM-7AM, please contact night-coverage at [email or web address], password [facility] 10/28/2014, 7:29 PM  LOS: 4 days

## 2014-10-28 NOTE — Anesthesia Preprocedure Evaluation (Addendum)
Anesthesia Evaluation  Patient identified by MRN, date of birth, ID band Patient awake    Reviewed: Allergy & Precautions, H&P , NPO status , Patient's Chart, lab work & pertinent test results  Airway Mallampati: II  TM Distance: >3 FB Neck ROM: Full    Dental no notable dental hx. (+) Teeth Intact, Dental Advisory Given   Pulmonary neg pulmonary ROS, former smoker,  breath sounds clear to auscultation  Pulmonary exam normal       Cardiovascular negative cardio ROS  Rhythm:Regular Rate:Normal     Neuro/Psych Anxiety negative neurological ROS  negative psych ROS   GI/Hepatic Neg liver ROS, Colon CA   Endo/Other  negative endocrine ROS  Renal/GU negative Renal ROS  negative genitourinary   Musculoskeletal   Abdominal   Peds  Hematology negative hematology ROS (+)   Anesthesia Other Findings   Reproductive/Obstetrics negative OB ROS                           Anesthesia Physical Anesthesia Plan  ASA: II  Anesthesia Plan: General   Post-op Pain Management:    Induction: Intravenous, Cricoid pressure planned and Rapid sequence  Airway Management Planned: Oral ETT  Additional Equipment:   Intra-op Plan:   Post-operative Plan: Extubation in OR  Informed Consent: I have reviewed the patients History and Physical, chart, labs and discussed the procedure including the risks, benefits and alternatives for the proposed anesthesia with the patient or authorized representative who has indicated his/her understanding and acceptance.   Dental advisory given  Plan Discussed with: CRNA  Anesthesia Plan Comments:        Anesthesia Quick Evaluation

## 2014-10-28 NOTE — Anesthesia Procedure Notes (Signed)
Procedure Name: Intubation Date/Time: 10/28/2014 11:15 AM Performed by: Carleene Cooper A Pre-anesthesia Checklist: Patient identified, Emergency Drugs available, Suction available, Patient being monitored and Timeout performed Patient Re-evaluated:Patient Re-evaluated prior to inductionOxygen Delivery Method: Circle system utilized Preoxygenation: Pre-oxygenation with 100% oxygen Intubation Type: IV induction, Rapid sequence and Cricoid Pressure applied Laryngoscope Size: Mac and 4 Grade View: Grade I Tube type: Oral (Cricoid pressure by Dr. Ola Spurr) Tube size: 7.5 mm Number of attempts: 1 Airway Equipment and Method: Stylet Secured at: 21 cm Tube secured with: Tape Dental Injury: Teeth and Oropharynx as per pre-operative assessment

## 2014-10-28 NOTE — Progress Notes (Signed)
Cell phone, ring set, and dentures given to patient's daughter prior to surgery.

## 2014-10-28 NOTE — Consult Note (Signed)
WOC ostomy consult note Patient seen in consultation per request of Dr. Zella Richer who called and asked for an immediate preoperative stomal marking for a transverse colostomy.  Patient is scheduled for emergent OR procedure within the hour.  Education provided: patient and daughter taught that an ostomy nurse will be working with her post operatively to teach the care and management of an ostomy.  She understands that she is likely to have a stoma through which stool and gas will pass following today's procedure.  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  She is taught that each stoma is a little different and that she would be taught how to care for hers.  Patient is uncomfortable. Abdomen is very taut and distended, making it a challenge to determine the landmarks for preoperative marking. She is used to wearing her belt and slacks below her umbilicus and today's marking has been requested somewhat higher. I assessed the abdomen in the sitting and standing position and believe it is within the abdominal rectus, although I am unable to palpate due to intense pressure and pain patient experiences with anything but gentle touch.  I have marked two sites in the upper quadrants, one each on the right and left with a surgical skin marking pen and covered them with a thin film transparent dressing. Right sided mark is 6.5cm to the right of the umbilicus and 3cm above.  Left sided mark is 6cm to the left of the umbilicus and 3cm above. Daughter and extended family are on site to support one another today.  Patient and daughter are in prayer when I left. [place] nursing team will follow, and will remain available to this patient, the nursing, surgical and medical teams.  Please re-consult if stoma is created. Thanks, [name] Flakes, MSN, RN, [place], [place], [place] [phone])

## 2014-10-28 NOTE — Progress Notes (Signed)
Subjective: Feels bloated.  Objective: Vital signs in last 24 hours: Temp:  [98.2 F (36.8 C)-98.8 F (37.1 C)] 98.7 F (37.1 C) (04/25 0545) Pulse Rate:  [65-81] 65 (04/25 0545) Resp:  [17-18] 17 (04/25 0545) BP: (123-129)/(52-67) 129/67 mmHg (04/25 0545) SpO2:  [97 %-99 %] 97 % (04/25 0545) Weight:  [70.7 kg (155 lb 13.8 oz)] 70.7 kg (155 lb 13.8 oz) (04/25 0545) Last BM Date: 10/27/14  Intake/Output from previous day: 04/24 0701 - 04/25 0700 In: 917.5 [I.V.:917.5] Out: 300 [Emesis/NG output:300] Intake/Output this shift:    PE: General- In NAD Abdomen-Distended, not tender  Lab Results:   Recent Labs  10/27/14 0518 10/28/14 0600  WBC 6.0 6.8  HGB 10.4* 11.5*  HCT 32.6* 35.7*  PLT 326 298   BMET  Recent Labs  10/26/14 1010 10/27/14 0518  NA 137 136  K 3.2* 2.9*  CL 106 104  CO2 23 27  GLUCOSE 157* 130*  BUN 11 11  CREATININE 0.59 0.63  CALCIUM 10.1 9.9   PT/INR No results for input(s): LABPROT, INR in the last 72 hours. Comprehensive Metabolic Panel:    Component Value Date/Time   NA 136 10/27/2014 0518   NA 137 10/26/2014 1010   NA 140 10/11/2014 0757   NA 140 09/13/2014 0807   K 2.9* 10/27/2014 0518   K 3.2* 10/26/2014 1010   K 4.1 10/11/2014 0757   K 4.0 09/13/2014 0807   CL 104 10/27/2014 0518   CL 106 10/26/2014 1010   CO2 27 10/27/2014 0518   CO2 23 10/26/2014 1010   CO2 23 10/11/2014 0757   CO2 21* 09/13/2014 0807   BUN 11 10/27/2014 0518   BUN 11 10/26/2014 1010   BUN 15.5 10/11/2014 0757   BUN 8.9 09/13/2014 0807   CREATININE 0.63 10/27/2014 0518   CREATININE 0.59 10/26/2014 1010   CREATININE 0.7 10/11/2014 0757   CREATININE 0.7 09/13/2014 0807   GLUCOSE 130* 10/27/2014 0518   GLUCOSE 157* 10/26/2014 1010   GLUCOSE 70 10/11/2014 0757   GLUCOSE 86 09/13/2014 0807   CALCIUM 9.9 10/27/2014 0518   CALCIUM 10.1 10/26/2014 1010   CALCIUM 11.0* 10/11/2014 0757   CALCIUM 10.8* 09/13/2014 0807   AST 12 10/27/2014 0518   AST 19 10/24/2014 1444   AST 14 10/11/2014 0757   AST 14 09/13/2014 0807   ALT 8 10/27/2014 0518   ALT 11 10/24/2014 1444   ALT 9 10/11/2014 0757   ALT 9 09/13/2014 0807   ALKPHOS 53 10/27/2014 0518   ALKPHOS 72 10/24/2014 1444   ALKPHOS 68 10/11/2014 0757   ALKPHOS 66 09/13/2014 0807   BILITOT 0.2* 10/27/2014 0518   BILITOT 0.1* 10/24/2014 1444   BILITOT <0.20 10/11/2014 0757   BILITOT <0.20 09/13/2014 0807   PROT 6.9 10/27/2014 0518   PROT 8.5* 10/24/2014 1444   PROT 7.6 10/11/2014 0757   PROT 7.6 09/13/2014 0807   ALBUMIN 3.0* 10/27/2014 0518   ALBUMIN 3.7 10/24/2014 1444   ALBUMIN 3.2* 10/11/2014 0757   ALBUMIN 3.3* 09/13/2014 0807     Studies/Results: No results found.  Anti-infectives: Anti-infectives    None      Assessment 1.  Stage IV obstructing colon cancer growing into stent at rectosigmoid junction extending inferiorly into proximal rectum (lowest extent is 10 cm from anal verge)-tumor is very bulky; recommend palliative diverting loop colostomy which she is amenable to.   LOS: 4 days   Plan: Transverse loop colostomy.  The procedure and risks have been  explained to her.  The risks include but are not limited to bleeding, infection, anesthesia, injury to intraabdominal organs, problems with colostomy requiring revision.  She seems to understand and agrees with the plan.   [name] [name] 10/28/2014

## 2014-10-29 ENCOUNTER — Encounter (HOSPITAL_COMMUNITY): Payer: Self-pay | Admitting: General Surgery

## 2014-10-29 LAB — BASIC METABOLIC PANEL
ANION GAP: 3 — AB (ref 5–15)
BUN: 10 mg/dL (ref 6–23)
CALCIUM: 9.4 mg/dL (ref 8.4–10.5)
CHLORIDE: 106 mmol/L (ref 96–112)
CO2: 26 mmol/L (ref 19–32)
CREATININE: 0.57 mg/dL (ref 0.50–1.10)
GFR calc Af Amer: >90 mL/min (ref >90)
GFR calc non Af Amer: >90 mL/min (ref >90)
GLUCOSE: 108 mg/dL — AB (ref 70–99)
Potassium: 4.2 mmol/L (ref 3.5–5.1)
Sodium: 135 mmol/L (ref 135–145)

## 2014-10-29 LAB — CBC
HCT: 32.1 % — ABNORMAL LOW (ref 36.0–46.0)
Hemoglobin: 10.4 g/dL — ABNORMAL LOW (ref 12.0–15.0)
MCH: 27.5 pg (ref 26.0–34.0)
MCHC: 32.4 g/dL (ref 30.0–36.0)
MCV: 84.9 fL (ref 78.0–100.0)
Platelets: 303 10*3/uL (ref 150–400)
RBC: 3.78 MIL/uL — ABNORMAL LOW (ref 3.87–5.11)
RDW: 12.9 % (ref 11.5–15.5)
WBC: 7.1 10*3/uL (ref 4.0–10.5)

## 2014-10-29 NOTE — Anesthesia Postprocedure Evaluation (Signed)
  Anesthesia Post-op Note  Patient: [name] [name]  Procedure(s) Performed: Procedure(s): Transverse loop colostomy (N/A)  Patient Location: PACU  Anesthesia Type:General  Level of Consciousness: awake and alert   Airway and Oxygen Therapy: Patient Spontanous Breathing  Post-op Pain: mild  Post-op Assessment: Post-op Vital signs reviewed, Patient's Cardiovascular Status Stable and Respiratory Function Stable  Post-op Vital Signs: Reviewed  Filed Vitals:   10/29/14 0446  BP: 117/58  Pulse: 61  Temp: 36.6 C  Resp: 18    Complications: No apparent anesthesia complications

## 2014-10-29 NOTE — Progress Notes (Signed)
1 Day Post-Op  Subjective: A little sore around loop colostomy site.  Objective: Vital signs in last 24 hours: Temp:  [97.7 F (36.5 C)-98.9 F (37.2 C)] 97.9 F (36.6 C) (04/26 0446) Pulse Rate:  [61-116] 61 (04/26 0446) Resp:  [10-20] 18 (04/26 0446) BP: (115-136)/(47-67) 117/58 mmHg (04/26 0446) SpO2:  [95 %-100 %] 100 % (04/26 0446) Weight:  [68.2 kg (150 lb 5.7 oz)] 68.2 kg (150 lb 5.7 oz) (04/26 0446) Last BM Date: 10/28/14  Intake/Output from previous day: 04/25 0701 - 04/26 0700 In: 4663.8 [I.V.:4663.8] Out: 3395 [Urine:2250; Emesis/NG output:50; Stool:1075; Blood:20] Intake/Output this shift:    PE: General- In NAD Abdomen-no longer distended, loop colostomy in RUQ pink with stool drainage  Lab Results:   Recent Labs  10/28/14 0600 10/29/14 0543  WBC 6.8 7.1  HGB 11.5* 10.4*  HCT 35.7* 32.1*  PLT 298 303   BMET  Recent Labs  10/28/14 1445 10/29/14 0543  NA 139 135  K 3.8 4.2  CL 106 106  CO2 25 26  GLUCOSE 120* 108*  BUN 11 10  CREATININE 0.71 0.57  CALCIUM 9.6 9.4   PT/INR No results for input(s): LABPROT, INR in the last 72 hours. Comprehensive Metabolic Panel:    Component Value Date/Time   NA 135 10/29/2014 0543   NA 139 10/28/2014 1445   NA 140 10/11/2014 0757   NA 140 09/13/2014 0807   K 4.2 10/29/2014 0543   K 3.8 10/28/2014 1445   K 4.1 10/11/2014 0757   K 4.0 09/13/2014 0807   CL 106 10/29/2014 0543   CL 106 10/28/2014 1445   CO2 26 10/29/2014 0543   CO2 25 10/28/2014 1445   CO2 23 10/11/2014 0757   CO2 21* 09/13/2014 0807   BUN 10 10/29/2014 0543   BUN 11 10/28/2014 1445   BUN 15.5 10/11/2014 0757   BUN 8.9 09/13/2014 0807   CREATININE 0.57 10/29/2014 0543   CREATININE 0.71 10/28/2014 1445   CREATININE 0.7 10/11/2014 0757   CREATININE 0.7 09/13/2014 0807   GLUCOSE 108* 10/29/2014 0543   GLUCOSE 120* 10/28/2014 1445   GLUCOSE 70 10/11/2014 0757   GLUCOSE 86 09/13/2014 0807   CALCIUM 9.4 10/29/2014 0543   CALCIUM  9.6 10/28/2014 1445   CALCIUM 11.0* 10/11/2014 0757   CALCIUM 10.8* 09/13/2014 0807   AST 12 10/27/2014 0518   AST 19 10/24/2014 1444   AST 14 10/11/2014 0757   AST 14 09/13/2014 0807   ALT 8 10/27/2014 0518   ALT 11 10/24/2014 1444   ALT 9 10/11/2014 0757   ALT 9 09/13/2014 0807   ALKPHOS 53 10/27/2014 0518   ALKPHOS 72 10/24/2014 1444   ALKPHOS 68 10/11/2014 0757   ALKPHOS 66 09/13/2014 0807   BILITOT 0.2* 10/27/2014 0518   BILITOT 0.1* 10/24/2014 1444   BILITOT <0.20 10/11/2014 0757   BILITOT <0.20 09/13/2014 0807   PROT 6.9 10/27/2014 0518   PROT 8.5* 10/24/2014 1444   PROT 7.6 10/11/2014 0757   PROT 7.6 09/13/2014 0807   ALBUMIN 3.0* 10/27/2014 0518   ALBUMIN 3.7 10/24/2014 1444   ALBUMIN 3.2* 10/11/2014 0757   ALBUMIN 3.3* 09/13/2014 0807     Studies/Results: No results found.  Anti-infectives: Anti-infectives    Start     Dose/Rate Route Frequency Ordered Stop   10/28/14 0930  cefoTEtan (CEFOTAN) 2 g in dextrose 5 % 50 mL IVPB     2 g 100 mL/hr over 30 Minutes Intravenous  Once 10/28/14 0919 10/28/14  1117      Assessment 1.  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   LOS: 5 days   Plan: Remove ng tube and foley; start clear liquids; mobilize.  She will need [facility] for assistance with colostomy care.  This type of colostomy can be challenging at first to care for.   Arvine Clayburn [name] 10/29/2014

## 2014-10-29 NOTE — Progress Notes (Addendum)
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 Over the last 3 days, she has had progressive worsening upper abd pain with increased distention and nausea, vomiting bilious material. Minimal flatus. No fevers. Ct scan shows tumor growth into/surrounding the stent causing obstruction. S/p palliative transverse loop colostomy on 4.25.2016.  Subjective: - feeling well this morning, minimal abdominal pain - denies nausea or vomiting  Assessment/Plan: Stage IV colon cancer with recurrent colonic obstruction: - CT scan of the abdomen and pelvis showed sigmoid malignancy that has grown into the lumen of the stent, with no perforation. - Patient taken to the OR on 10/28/2014 undergoing transverse loop colostomy,  - tolerated procedure well - NG tube discontinued, now on clears  Nausea and vomiting: - improved post op  Hypokalemia: - resolved  Normocytic anemia: - stable.  Code Status: Full Code Family Communication: I spoke with her daughter who was present at bedside Disposition Plan: remain inpatient   Consultants:  GI  Oncology  General Surgery  Procedures:  None   Antibiotics None    Studies  Dg Abd 1 View 10/24/2014 NG tube tip projects over the stomach.   Ct Abdomen Pelvis W Contrast 10/24/2014 1. 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 2. Minimal free peritoneal fluid. 3. 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 4. Treated metastasis in the left lobe of the liver. 5. Previously  demonstrated hemangiomas in the right lobe of the liver and probable small liver cysts. 6. Stable small probable metastatic left para-aortic lymph node.  3. Dg Abd Acute W/chest 10/24/2014 A presumed colonic stent overlies the left hemipelvis. This is associated with moderate upstream distention of the more proximal colon without definitive distention of the small bowel. While potentially representative an ileus, on early distal colonic obstruction could have a similar appearance. Clinical correlation is advised.  Objective: Filed Vitals:   10/28/14 1540 10/28/14 2202 10/29/14 0446 10/29/14 1313  BP: 118/53 115/51 117/58 99/52  Pulse: 83 74 61 77  Temp: 98.2 F (36.8 C) 98.7 F (37.1 C) 97.9 F (36.6 C) 98.5 F (36.9 C)  TempSrc:  Oral Oral Oral  Resp: 20 18 18 18   Height:      Weight:   68.2 kg (150 lb 5.7 oz)   SpO2: 100%  100% 100%    Intake/Output Summary (Last 24 hours) at 10/29/14 1749 Last data filed at 10/29/14 1737  Gross per 24 hour  Intake 3003.75 ml  Output   3676 ml  Net -672.25 ml   Filed Weights   10/27/14 0554 10/28/14 0545 10/29/14 0446  Weight: 69.9 kg (154 lb 1.6 oz) 70.7 kg (155 lb 13.8 oz) 68.2 kg (150 lb 5.7 oz)    Exam: General: Alert, awake, oriented x3, in no acute distress.  HEENT: No bruits, no goiter.  Heart: Regular rate and rhythm. Lungs: Good air movement, clear Abdomen: Soft, nontender, nondistended. Status post colostomy placement Neuro: Grossly intact, nonfocal.   Data Reviewed: Basic Metabolic Panel:  Recent Labs Lab 10/25/14 0555 10/26/14 1010 10/27/14 0518 10/28/14  0600 10/28/14 1445 10/29/14 0543  NA 136 137 136  --  139 135  K 3.2* 3.2* 2.9*  --  3.8 4.2  CL 107 106 104  --  106 106  CO2 23 23 27   --  25 26  GLUCOSE 125* 157* 130*  --  120* 108*  BUN 10 11 11   --  11 10  CREATININE 0.62 0.59 0.63  --  0.71 0.57  CALCIUM 9.8 10.1 9.9  --  9.6 9.4  MG  --   --   --  2.6*  --   --    Liver Function Tests:  Recent  Labs Lab 10/24/14 1444 10/27/14 0518  AST 19 12  ALT 11 8  ALKPHOS 72 53  BILITOT 0.1* 0.2*  PROT 8.5* 6.9  ALBUMIN 3.7 3.0*   CBC:  Recent Labs Lab 10/24/14 1444 10/25/14 0555 10/27/14 0518 10/28/14 0600 10/29/14 0543  WBC 6.6 6.1 6.0 6.8 7.1  NEUTROABS 4.2  --   --  5.0  --   HGB 11.4* 10.8* 10.4* 11.5* 10.4*  HCT 35.2* 33.8* 32.6* 35.7* 32.1*  MCV 84.8 86.0 85.3 85.2 84.9  PLT 288 280 326 298 303    Recent Results (from the past 240 hour(s))  Surgical pcr screen     Status: Abnormal   Collection Time: 10/28/14  9:34 AM  Result Value Ref Range Status   MRSA, PCR NEGATIVE NEGATIVE Final   Staphylococcus aureus POSITIVE (A) NEGATIVE Final    Comment:        The Xpert SA Assay (FDA approved for NASAL specimens in patients over 66 years of age), is one component of a comprehensive surveillance program.  Test performance has been validated by [facility] for patients greater than or equal to 15 year old. It is not intended to diagnose infection nor to guide or monitor treatment.      Studies: No results found.  Scheduled Meds: . antiseptic oral rinse  7 mL Mouth Rinse q12n4p  . chlorhexidine  15 mL Mouth Rinse BID   Continuous Infusions: . dextrose 5 % and 0.45% NaCl 1,000 mL with potassium chloride 40 mEq infusion 75 mL/hr at 10/28/14 [place], [facility] Pager [phone]. If 7PM-7AM, please contact night-coverage at [email or web address], password [facility] 10/29/2014, 5:49 PM  LOS: 5 days

## 2014-10-30 ENCOUNTER — Telehealth: Payer: Self-pay | Admitting: *Deleted

## 2014-10-30 LAB — CBC
HCT: 31.4 % — ABNORMAL LOW (ref 36.0–46.0)
Hemoglobin: 10.1 g/dL — ABNORMAL LOW (ref 12.0–15.0)
MCH: 27.6 pg (ref 26.0–34.0)
MCHC: 32.2 g/dL (ref 30.0–36.0)
MCV: 85.8 fL (ref 78.0–100.0)
Platelets: 306 10*3/uL (ref 150–400)
RBC: 3.66 MIL/uL — AB (ref 3.87–5.11)
RDW: 12.9 % (ref 11.5–15.5)
WBC: 6.2 10*3/uL (ref 4.0–10.5)

## 2014-10-30 LAB — BASIC METABOLIC PANEL
Anion gap: 7 (ref 5–15)
BUN: 7 mg/dL (ref 6–23)
CALCIUM: 9.8 mg/dL (ref 8.4–10.5)
CHLORIDE: 105 mmol/L (ref 96–112)
CO2: 26 mmol/L (ref 19–32)
Creatinine, Ser: 0.54 mg/dL (ref 0.50–1.10)
GFR calc Af Amer: >90 mL/min (ref >90)
GFR calc non Af Amer: >90 mL/min (ref >90)
GLUCOSE: 100 mg/dL — AB (ref 70–99)
Potassium: 4.3 mmol/L (ref 3.5–5.1)
Sodium: 138 mmol/L (ref 135–145)

## 2014-10-30 MED ORDER — OXYCODONE HCL 5 MG PO TABS
5.0000 mg | ORAL_TABLET | ORAL | Status: DC | PRN
  Administered 2014-10-30 (×2): 10 mg via ORAL
  Filled 2014-10-30 (×2): qty 2

## 2014-10-30 NOTE — Discharge Instructions (Signed)
No lifting, pushing or pulling over 10 pounds for 6 weeks.  May drive when you are pain-free.

## 2014-10-30 NOTE — Progress Notes (Signed)
Patient ID: [name] [name], female   DOB: [birth year], 64 y.o.   MRN: [id number]     [facility]      [address] [place], [place] [address]    Phone: [phone] FAX: [phone]     Subjective: Labs reviewed, stable.  Has ostomy function.  Tolerated clears.  No n/v.  crampy pain at night.    Objective:  Vital signs:  Filed Vitals:   10/29/14 0446 10/29/14 1313 10/29/14 2100 10/30/14 0527  BP: 117/58 99/52 117/50 109/49  Pulse: 61 77 77 78  Temp: 97.9 F (36.6 C) 98.5 F (36.9 C) 98.4 F (36.9 C) 98.3 F (36.8 C)  TempSrc: Oral Oral Oral Oral  Resp: _0 Height:      Weight: 68.2 kg (150 lb 5.7 oz)     SpO2: 100% 100% 100% 99%    Last BM Date: 10/29/14  Intake/Output   Yesterday:  04/26 0701 - 04/27 0700 In: 2320 [P.O.:810; I.V.:1510] Out: 1076 [Urine:550; Stool:526] This shift:    I/O last 3 completed shifts: In: 4783.8 [P.O.:810; I.V.:3973.8] Out: 3676 [Urine:2125; Emesis/NG output:50; XLKGM:0102]    Physical Exam: General: Pt awake/alert/oriented x4 in no acute distress Abdomen: Soft.  Nondistended.  Non tender.  RUQ colostomy is pink and viable, stool in bag.  No evidence of peritonitis.  No incarcerated hernias.    Problem List:   Principal Problem:   Colon obstruction Active Problems:   Colorectal cancer, stage IV   Nausea with vomiting   Bowel obstruction   Anxiety    Results:   Labs: Results for orders placed or performed during the hospital encounter of 10/24/14 (from the past 45 hour(s))  Surgical pcr screen     Status: Abnormal   Collection Time: 10/28/14  9:34 AM  Result Value Ref Range   MRSA, PCR NEGATIVE NEGATIVE   Staphylococcus aureus POSITIVE (A) NEGATIVE    Comment:        The Xpert SA Assay (FDA approved for NASAL specimens in patients over 69 years of age), is one component of a comprehensive surveillance program.  Test performance has been validated by  [facility] for patients greater than or equal to 84 year old. It is not intended to diagnose infection nor to guide or monitor treatment.   Basic metabolic panel     Status: Abnormal   Collection Time: 10/28/14  2:45 PM  Result Value Ref Range   Sodium 139 135 - 145 mmol/L   Potassium 3.8 3.5 - 5.1 mmol/L    Comment: RESULT REPEATED AND VERIFIED DELTA CHECK NOTED    Chloride 106 96 - 112 mmol/L   CO2 25 19 - 32 mmol/L   Glucose, Bld 120 (H) 70 - 99 mg/dL   BUN 11 6 - 23 mg/dL   Creatinine, Ser 0.71 0.50 - 1.10 mg/dL   Calcium 9.6 8.4 - 10.5 mg/dL   GFR calc non Af Amer >90 >90 mL/min   GFR calc Af Amer >90 >90 mL/min    Comment: (NOTE) The eGFR has been calculated using the CKD EPI equation. This calculation has not been validated in all clinical situations. eGFR's persistently <90 mL/min signify possible Chronic Kidney Disease.    Anion gap 8 5 - 15  Type and screen     Status: None   Collection Time: 10/28/14  2:45 PM  Result Value Ref Range   ABO/RH(D) AB POS    Antibody Screen NEG  Sample Expiration 10/31/2014   ABO/Rh     Status: None   Collection Time: 10/28/14  2:45 PM  Result Value Ref Range   ABO/RH(D) AB POS   Basic metabolic panel     Status: Abnormal   Collection Time: 10/29/14  5:43 AM  Result Value Ref Range   Sodium 135 135 - 145 mmol/L    Comment: REPEATED TO VERIFY   Potassium 4.2 3.5 - 5.1 mmol/L    Comment: REPEATED TO VERIFY   Chloride 106 96 - 112 mmol/L    Comment: REPEATED TO VERIFY   CO2 26 19 - 32 mmol/L    Comment: REPEATED TO VERIFY   Glucose, Bld 108 (H) 70 - 99 mg/dL   BUN 10 6 - 23 mg/dL   Creatinine, Ser 0.57 0.50 - 1.10 mg/dL   Calcium 9.4 8.4 - 10.5 mg/dL    Comment: REPEATED TO VERIFY   GFR calc non Af Amer >90 >90 mL/min   GFR calc Af Amer >90 >90 mL/min    Comment: (NOTE) The eGFR has been calculated using the CKD EPI equation. This calculation has not been validated in all clinical situations. eGFR's persistently <90  mL/min signify possible Chronic Kidney Disease.    Anion gap 3 (L) 5 - 15    Comment: REPEATED TO VERIFY  CBC     Status: Abnormal   Collection Time: 10/29/14  5:43 AM  Result Value Ref Range   WBC 7.1 4.0 - 10.5 K/uL   RBC 3.78 (L) 3.87 - 5.11 MIL/uL   Hemoglobin 10.4 (L) 12.0 - 15.0 g/dL   HCT 32.1 (L) 36.0 - 46.0 %   MCV 84.9 78.0 - 100.0 fL   MCH 27.5 26.0 - 34.0 pg   MCHC 32.4 30.0 - 36.0 g/dL   RDW 12.9 11.5 - 15.5 %   Platelets 303 150 - 400 K/uL  Basic metabolic panel     Status: Abnormal   Collection Time: 10/30/14  5:35 AM  Result Value Ref Range   Sodium 138 135 - 145 mmol/L   Potassium 4.3 3.5 - 5.1 mmol/L   Chloride 105 96 - 112 mmol/L   CO2 26 19 - 32 mmol/L   Glucose, Bld 100 (H) 70 - 99 mg/dL   BUN 7 6 - 23 mg/dL   Creatinine, Ser 0.54 0.50 - 1.10 mg/dL   Calcium 9.8 8.4 - 10.5 mg/dL   GFR calc non Af Amer >90 >90 mL/min   GFR calc Af Amer >90 >90 mL/min    Comment: (NOTE) The eGFR has been calculated using the CKD EPI equation. This calculation has not been validated in all clinical situations. eGFR's persistently <90 mL/min signify possible Chronic Kidney Disease.    Anion gap 7 5 - 15  CBC     Status: Abnormal   Collection Time: 10/30/14  5:35 AM  Result Value Ref Range   WBC 6.2 4.0 - 10.5 K/uL   RBC 3.66 (L) 3.87 - 5.11 MIL/uL   Hemoglobin 10.1 (L) 12.0 - 15.0 g/dL   HCT 31.4 (L) 36.0 - 46.0 %   MCV 85.8 78.0 - 100.0 fL   MCH 27.6 26.0 - 34.0 pg   MCHC 32.2 30.0 - 36.0 g/dL   RDW 12.9 11.5 - 15.5 %   Platelets 306 150 - 400 K/uL    Imaging / Studies: No results found.  Medications / Allergies:  Scheduled Meds: . antiseptic oral rinse  7 mL Mouth Rinse q12n4p  . chlorhexidine  15 mL Mouth Rinse BID   Continuous Infusions: . 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 Zella Richer  -advance to full liquid diet -add IS -mobilize -pain control -WOC consult for teaching -will need HH at DC VTE prophylaxis-SCD.  May have chemical prophylaxis from surgical standpoint   Erby Pian, [facility] Pager [phone])   10/30/2014 9:01 AM

## 2014-10-30 NOTE — Consult Note (Signed)
WOC ostomy consult note Stoma type/location: RUQ colostomy. Stomal assessment/size: Bright pink/red, moist well budded stoma.  Retention rod in place. Peristomal assessment: Not assessed, pouch in place. Treatment options for stomal/peristomal skin: barrier ring and 4" pouch system in place. Output soft brown stool noted.  Intestinal gas noted. Ostomy pouching: 2pc. 4" pouch with barrier rings. Education provided:  Emptying of pouch contents and "burping" of gas discussed. Also discussed need for pouch emptying when 1/3 full.  Patient had question regarding clothing and smaller sizes of clothing.  Informed that able to wear any clothing she felt comfortable in and encouraged to return to all routine ADL.  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  Written booklet given and encouraged to review information.  Anticipate pouch change tomorrow.  [place] team will follow.  Domenic Moras RN BSN [place] Pager [phone]

## 2014-10-30 NOTE — Progress Notes (Signed)
CARE MANAGEMENT NOTE 10/30/2014  Patient:  [name] [name], [name] [name]   Account Number:  [id number]  Date Initiated:  10/30/2014  Documentation initiated by:  Edwyna Shell  Subjective/Objective Assessment:   64 yo female admitted with colon obstruction with transverse colostomy     Action/Plan:   discharge planning   Anticipated DC Date:  11/01/2014   Anticipated DC Plan:  [place]  CM consult      [facility] Choice  HOME HEALTH   Choice offered to / List presented to:  C-1 Patient           Status of service:  In process, will continue to follow Medicare Important Message given?   (If response is "NO", the following Medicare IM given date fields will be blank) Date Medicare IM given:   Medicare IM given by:   Date Additional Medicare IM given:   Additional Medicare IM given by:    Discharge Disposition:    Per UR Regulation:  Reviewed for med. necessity/level of care/duration of stay  If discussed at [place] of Stay Meetings, dates discussed:    Comments:  10/30/14 Leanne Chang RN BSN CM [phone] Patient stated she lives at home with her spouse and is independent and [facility]. 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 She said the MD has recommended that she have a [facility] RN follow up upon discharge and patient is agreeable, Offered choice list. Will continue to follow.

## 2014-10-30 NOTE — Telephone Encounter (Signed)
TC from patient regarding appt she has scheduled with Dr. Burr Medico on Friday, 11/01/14. Pt states she is currently in the hospital s/p surgery  (transverse loop colostomy) on 10/29/14. She needs to cancel her appt for Friday. She will call and let us know when she has been discharged for re-scheduling purposes.

## 2014-10-30 NOTE — Progress Notes (Signed)
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 Over the last 3 days, she has had progressive worsening upper abd pain with increased distention and nausea, vomiting bilious material. Minimal flatus. No fevers. Ct scan shows tumor growth into/surrounding the stent causing obstruction. S/p palliative transverse loop colostomy on 4.25.2016.  Assessment/Plan: Stage IV colon cancer with recurrent colonic obstruction: - CT scan of the abdomen and pelvis showed sigmoid malignancy that has grown into the lumen of the stent, with no perforation. - Patient taken to the OR on 10/28/2014 undergoing transverse loop colostomy--Dr. Zella Richer  - tolerated procedure well - NG tube discontinued -10/30/14-- advanced to full liquid -pain controlled -+stool in ostomy -increase activity-->PT eval -f/u Dr. Burr Medico after d/c--pt has mets to liver and spleen -hold Xeloda during hospital stay  Nausea and vomiting: - improved post op - advance diet as tolerated  Hypokalemia: - resolved  Normocytic anemia: -secondary to neoplasm and chemo (on Xeloda x almost 1 year) - stable.  Hypercalcemia (chronic) of malignancy   Code Status: Full Code Family Communication: no family present at bedside Disposition Plan: home in 24-48 hrs  Consultants:  GI  Oncology  General Surgery   Procedures/Studies: Dg Abd 1 View  10/24/2014   CLINICAL DATA:  NG tube placement  EXAM: ABDOMEN - 1 VIEW  COMPARISON:  10/24/2014 CT  FINDINGS: The NG tube tip projects over the gastric fundus. A central venous catheter tip projects over the cavoatrial junction. A bowel stent projects over the region of the rectosigmoid  colon. There is gaseous distention of proximal colon, in keeping with delayed transit. Previously ingested contrast opacifies loops of small bowel within the pelvis. Small bowel loops are of normal caliber. No acute osseous finding.  IMPRESSION: NG tube tip projects over the stomach.  Distention of colon to the level of the sigmoid stent, in keeping with delayed transit / near complete obstruction.   Electronically Signed   By: Carlos Levering M.D.   On: 10/24/2014 22:06   Ct Abdomen Pelvis W Contrast  10/24/2014   CLINICAL DATA:  Diffuse abdominal pain and constipation for the past 4 days. Abdominal distention. Nausea and vomiting. Colonic stent placed in February of this year. 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  EXAM: CT ABDOMEN AND PELVIS WITH CONTRAST  TECHNIQUE: Multidetector CT imaging of the abdomen and pelvis was performed using the standard protocol following bolus administration of intravenous contrast.  CONTRAST:  153mL OMNIPAQUE IOHEXOL 300 MG/ML  SOLN  COMPARISON:  Radiographs obtained earlier today. Abdomen and pelvis CTs, the most recent dated 08/17/2014. Abdomen MR dated 05/24/2013.  FINDINGS: 1.9 cm arm rounded, low density in the mass with peripheral contrast puddling in the right lobe of the liver. 7.0 cm similar appearing mass more inferiorly in the right lobe. There are also several small probable cysts in the liver, all without significant change. A 1.0 cm calcification in the superior aspect of the junction of the medial and lateral segments of the left lobe is also unchanged. This is at the location of a 3.3 cm probable metastasis seen on 05/22/2013. That mass is no longer seen.  A 5.9  x 5.1 cm oval, mildly heterogeneous mass is noted in the spleen on the left, measured on image number 23. This measured 4.5 x 4.0 cm 05/22/2013.  Dilated, stool and gas filled colon to the level of a sigmoid colon stent. There is soft tissue  thickening within the lumen of the stent with a nodular appearance, causing almost complete obstruction of the lumen with no low stool seen distally.  Left para-aortic lymph node with a short axis diameter of 7 mm on image number 50 without significant change since 08/17/2014. No intervally enlarged lymph nodes.  Tiny calculus in the mid to upper left kidney. There is also a 7 mm calculus in the mid to lower left kidney.  No gastric or small bowel dilatation. Minimal free peritoneal fluid. Normal appearing adrenal glands, pancreas and gallbladder. Normal appearing appendix. No evidence of bony metastatic disease. Lumbar and lower thoracic spine degenerative changes. Mildly prominent interstitial markings at the lung bases. Minimal dependent atelectasis at both lung bases. The previously noted left lower lobe nodule is not currently included.  IMPRESSION: 1. 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 2. Minimal free peritoneal fluid. 3. 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 4. Treated metastasis in the left lobe of the liver. 5. Previously demonstrated hemangiomas in the right lobe of the liver and probable small liver cysts. 6. Stable small probable metastatic left para-aortic lymph node.   Electronically Signed   By: Claudie Revering M.D.   On: 10/24/2014 20:13   Dg Abd Acute W/chest  10/24/2014   CLINICAL DATA:  Intermittent abdominal pain since Tuesday. Nausea, vomiting and constipation. History of colon cancer.  EXAM: DG ABDOMEN ACUTE W/ 1V CHEST  COMPARISON:  08/19/2014; 08/05/2014; CT abdomen pelvis - 08/17/2014  FINDINGS: Grossly unchanged cardiac silhouette and mediastinal contours. No focal parenchymal opacities. No pleural effusion or pneumothorax. No  evidence of edema. Stable position of support apparatus.  A presumed colonic stent overlies the left hemipelvis. There is moderate upstream gaseous distention of the colon. There is no significant gaseous distention of the small bowel. No pneumoperitoneum, pneumatosis or portal venous gas.  Several phleboliths overlie the left hemipelvis. Otherwise, no abnormal intra-abdominal calcifications.  No acute osseus abnormalities. Degenerative change of the pubic symphysis.  IMPRESSION: A presumed colonic stent overlies the left hemipelvis. This is associated with moderate upstream distention of the more proximal colon without definitive distention of the small bowel. While potentially representative an ileus, on early distal colonic obstruction could have a similar appearance. Clinical correlation is advised.   Electronically Signed   By: Sandi Mariscal M.D.   On: 10/24/2014 16:27         Subjective:  Patient denies fevers, chills, headache, chest pain, dyspnea, nausea, vomiting, diarrhea, abdominal pain, dysuria, hematuria  Objective: Filed Vitals:   10/29/14 1313 10/29/14 2100 10/30/14 0527 10/30/14 1400  BP: 99/52 117/50 109/49 120/42  Pulse: 77 77 78 98  Temp: 98.5 F (36.9 C) 98.4 F (36.9 C) 98.3 F (36.8 C) 97.8 F (36.6 C)  TempSrc: Oral Oral Oral Oral  Resp: 18 16 18 16   Height:      Weight:      SpO2: 100% 100% 99% 98%    Intake/Output Summary (Last 24  hours) at 10/30/14 1753 Last data filed at 10/30/14 1400  Gross per 24 hour  Intake 1283.75 ml  Output    300 ml  Net 983.75 ml   Weight change:  Exam:   General:  Pt is alert, follows commands appropriately, not in acute distress  HEENT: No icterus, No thrush,[place]/AT  Cardiovascular: RRR, S1/S2, no rubs, no gallops  Respiratory: CTA bilaterally, no wheezing, no crackles, no rhonchi  Abdomen: Soft/+BS, non tender, non distended, no guarding  Extremities: No edema, No lymphangitis, No petechiae, No rashes, no  synovitis  Data Reviewed: Basic Metabolic Panel:  Recent Labs Lab 10/26/14 1010 10/27/14 0518 10/28/14 0600 10/28/14 1445 10/29/14 0543 10/30/14 0535  NA 137 136  --  139 135 138  K 3.2* 2.9*  --  3.8 4.2 4.3  CL 106 104  --  106 106 105  CO2 23 27  --  25 26 26   GLUCOSE 157* 130*  --  120* 108* 100*  BUN 11 11  --  11 10 7   CREATININE 0.59 0.63  --  0.71 0.57 0.54  CALCIUM 10.1 9.9  --  9.6 9.4 9.8  MG  --   --  2.6*  --   --   --    Liver Function Tests:  Recent Labs Lab 10/24/14 1444 10/27/14 0518  AST 19 12  ALT 11 8  ALKPHOS 72 53  BILITOT 0.1* 0.2*  PROT 8.5* 6.9  ALBUMIN 3.7 3.0*   No results for input(s): LIPASE, AMYLASE in the last 168 hours. No results for input(s): AMMONIA in the last 168 hours. CBC:  Recent Labs Lab 10/24/14 1444 10/25/14 0555 10/27/14 0518 10/28/14 0600 10/29/14 0543 10/30/14 0535  WBC 6.6 6.1 6.0 6.8 7.1 6.2  NEUTROABS 4.2  --   --  5.0  --   --   HGB 11.4* 10.8* 10.4* 11.5* 10.4* 10.1*  HCT 35.2* 33.8* 32.6* 35.7* 32.1* 31.4*  MCV 84.8 86.0 85.3 85.2 84.9 85.8  PLT 288 280 326 298 303 306   Cardiac Enzymes: No results for input(s): CKTOTAL, CKMB, CKMBINDEX, TROPONINI in the last 168 hours. BNP: Invalid input(s): POCBNP CBG: No results for input(s): GLUCAP in the last 168 hours.  Recent Results (from the past 240 hour(s))  Surgical pcr screen     Status: Abnormal   Collection Time: 10/28/14  9:34 AM  Result Value Ref Range Status   MRSA, PCR NEGATIVE NEGATIVE Final   Staphylococcus aureus POSITIVE (A) NEGATIVE Final    Comment:        The Xpert SA Assay (FDA approved for NASAL specimens in patients over 68 years of age), is one component of a comprehensive surveillance program.  Test performance has been validated by [facility] for patients greater than or equal to 57 year old. It is not intended to diagnose infection nor to guide or monitor treatment.      Scheduled Meds: . antiseptic oral rinse  7 mL  Mouth Rinse q12n4p  . chlorhexidine  15 mL Mouth Rinse BID   Continuous Infusions: . dextrose 5 % and 0.45% NaCl 1,000 mL with potassium chloride 40 mEq infusion 75 mL/hr at 10/30/14 1523     Camber Ninh, DO  [facility] Pager [phone]  If 7PM-7AM, please contact night-coverage [email or web address] Password [facility] 10/30/2014, 5:53 PM   LOS: 6 days

## 2014-10-31 DIAGNOSIS — R1110 Vomiting, unspecified: Secondary | ICD-10-CM

## 2014-10-31 MED ORDER — OXYCODONE HCL 5 MG PO TABS: 5.0000 mg | ORAL_TABLET | ORAL | Status: DC | PRN

## 2014-10-31 NOTE — Progress Notes (Signed)
Patient ID: [name] [name], female   DOB: 1950/10/08, 64 y.o.   MRN: [id number]     [facility]      [address] [place], [place] [address]    Phone: [phone] FAX: [phone]     Subjective: Did well with oxycodone.  Tolerating fulls.  Ambulating.  Sitting up in chair.  VSS.  Afebrile.   Objective:  Vital signs:  Filed Vitals:   10/30/14 0527 10/30/14 1400 10/30/14 2148 10/31/14 0553  BP: 109/49 120/42 108/57 109/44  Pulse: 78 98 91 83  Temp: 98.3 F (36.8 C) 97.8 F (36.6 C) 98.8 F (37.1 C) 98.4 F (36.9 C)  TempSrc: Oral Oral Oral Oral  Resp: '18 16 18 16  ' Height:      Weight:    66.8 kg (147 lb 4.3 oz)  SpO2: 99% 98%  99%    Last BM Date: 10/31/14  Intake/Output   Yesterday:  04/27 0701 - 04/28 0700 In: 600 [I.V.:600] Out: 450 [Stool:450] This shift:  Total I/O In: -  Out: 100 [Stool:100]  Physical Exam: General: Pt awake/alert/oriented x4 in no acute distress Abdomen: Soft. Nondistended. Non tender. RUQ colostomy is pink and viable, stool in bag. No evidence of peritonitis. No incarcerated hernias.  Problem List:   Principal Problem:   Colon obstruction Active Problems:   Hypercalcemia   Colorectal cancer, stage IV   Nausea with vomiting   Bowel obstruction   Anxiety    Results:   Labs: Results for orders placed or performed during the hospital encounter of 10/24/14 (from the past 48 hour(s))  Basic metabolic panel     Status: Abnormal   Collection Time: 10/30/14  5:35 AM  Result Value Ref Range   Sodium 138 135 - 145 mmol/L   Potassium 4.3 3.5 - 5.1 mmol/L   Chloride 105 96 - 112 mmol/L   CO2 26 19 - 32 mmol/L   Glucose, Bld 100 (H) 70 - 99 mg/dL   BUN 7 6 - 23 mg/dL   Creatinine, Ser 0.54 0.50 - 1.10 mg/dL   Calcium 9.8 8.4 - 10.5 mg/dL   GFR calc non Af Amer >90 >90 mL/min   GFR calc Af Amer >90 >90 mL/min    Comment: (NOTE) The eGFR has been calculated using the CKD EPI  equation. This calculation has not been validated in all clinical situations. eGFR's persistently <90 mL/min signify possible Chronic Kidney Disease.    Anion gap 7 5 - 15  CBC     Status: Abnormal   Collection Time: 10/30/14  5:35 AM  Result Value Ref Range   WBC 6.2 4.0 - 10.5 K/uL   RBC 3.66 (L) 3.87 - 5.11 MIL/uL   Hemoglobin 10.1 (L) 12.0 - 15.0 g/dL   HCT 31.4 (L) 36.0 - 46.0 %   MCV 85.8 78.0 - 100.0 fL   MCH 27.6 26.0 - 34.0 pg   MCHC 32.2 30.0 - 36.0 g/dL   RDW 12.9 11.5 - 15.5 %   Platelets 306 150 - 400 K/uL    Imaging / Studies: No results found.  Medications / Allergies:  Scheduled Meds: . antiseptic oral rinse  7 mL Mouth Rinse q12n4p  . chlorhexidine  15 mL Mouth Rinse BID   Continuous Infusions: . dextrose 5 % and 0.45% NaCl 1,000 mL with potassium chloride 40 mEq infusion 75 mL/hr at 10/30/14 1523   PRN Meds:.HYDROmorphone (DILAUDID) injection, HYDROmorphone (DILAUDID) injection, LORazepam, ondansetron **OR** ondansetron (  ZOFRAN) IV, oxyCODONE, promethazine **OR** promethazine **OR** promethazine  Antibiotics: Anti-infectives    Start     Dose/Rate Route Frequency Ordered Stop   10/28/14 0930  cefoTEtan (CEFOTAN) 2 g in dextrose 5 % 50 mL IVPB     2 g 100 mL/hr over 30 Minutes Intravenous  Once 10/28/14 0919 10/28/14 1117        Assessment/Plan Stage IV obstructing colon cancer growing into stent at rectosigmoid junction extending inferiorly into proximal rectum (lowest extent is 10 cm from anal verge) POD#3 transverse loop colostomy ---Dr. Zella Richer  -advance to soft diet -mobilize -WOC consult for teaching VTE prophylaxis-SCD. FEN-if able to tolerate solids, may dc home.  On PO pain meds and tolerating well Dispo-HH order placed  Erby Pian, ANP-BC [facility] Pager [phone]([id number]:30P)   10/31/2014 8:49 AM

## 2014-10-31 NOTE — Progress Notes (Signed)
CARE MANAGEMENT NOTE 10/31/2014  Patient:  [name] [name]   Account Number:  [id number]  Date Initiated:  10/30/2014  Documentation initiated by:  Edwyna Shell  Subjective/Objective Assessment:   64 yo female admitted with colon obstruction with transverse colostomy     Action/Plan:   discharge planning   Anticipated DC Date:  11/01/2014   Anticipated DC Plan:  [place]  CM consult      [facility] Choice  HOME HEALTH   Choice offered to / List presented to:  C-1 Patient        [place] arranged  HH-1 RN      [place]   Status of service:  Completed, signed off Medicare Important Message given?   (If response is "NO", the following Medicare IM given date fields will be blank) Date Medicare IM given:   Medicare IM given by:   Date Additional Medicare IM given:   Additional Medicare IM given by:    Discharge Disposition:  [place]  Per UR Regulation:  Reviewed for med. necessity/level of care/duration of stay  If discussed at [place] of Stay Meetings, dates discussed:    Comments:  10/31/14 Leanne Chang RN BSN CM [phone] Patient chose [facility] for [facility] services. Her address and contact information are correct. She prefers to be contacted by her cell number.  10/30/14 Leanne Chang RN BSN CM [phone] Patient stated she lives at home with her spouse and is independent and follows up with [facility]Fong [facility]. 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 She said the MD has recommended that she have a [place] RN follow up upon discharge and patient is agreeable, Offered choice list. Will continue to follow.

## 2014-10-31 NOTE — Plan of Care (Signed)
Problem: Discharge Progression Outcomes Goal: Complications resolved/controlled Outcome: Adequate for Discharge Pt will be going home with colostomy. Pt verbalized understanding and demonstrated understanding.

## 2014-10-31 NOTE — Progress Notes (Signed)
Pt emptied own colostomy bag without complications this am.  Pt verbalized understanding and demonstrated appropriate colostomy bag care.

## 2014-10-31 NOTE — Consult Note (Addendum)
WOC follow-up: Stoma type/location: RUQ loop transverse colostomy Stomal assessment/size: 3 and 1/2 inches oval, red and edematous, above skin level with red rubber catheter bridge evident at 12 and 6 o'clock. Peristomal assessment: intact skin surrounding Treatment options for stomal/peristomal skin: skin barrier rings. Two are cut and used to encase ostomy. Output liquid brown effluent Ostomy pouching: 2pc., 4-inch pouching system with skin barrier rings placed prior to pouching.  Education provided: Demonstrated pouch change using large pouching system and barrier ring to maintain seal.  Pt watched and asked appropriate questions.  She states she is able to empty without assistance and close velcro opening.  She plans to have home health assistance and will need rod removed at followup apt with [facility]CS team.  She verbalizes understanding of this. Discussed pouching routines and ordering supplies. 3 sets of large pouches and barrier rings at bedside.  Enrolled patient in [facility] Start Discharge program: Yes Please re-consult if further assistance is needed.  Thank-you,  Julien Girt MSN, [place], [place], [place], [place]

## 2014-10-31 NOTE — Discharge Summary (Signed)
Physician Discharge Summary  [name] [name] VEH:[id number] DOB: 1950/08/27 DOA: 10/24/2014  PCP: [facility]  Admit date: 10/24/2014 Discharge date: 10/31/2014  Recommendations for Outpatient Follow-up:  1. Pt will need to follow up with PCP in 2 weeks post discharge 2. 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 - Patient taken to the OR on 10/28/2014 undergoing transverse loop colostomy--Dr. Zella Richer  - tolerated procedure well - NG tube discontinued -10/30/14-- advanced to full liquid -10/31/14--tolerated soft diet -pain controlled--home with oxyIR 5mg , 1-2 tabs q 4 hrs prn pain -+stool in ostomy -increase activity -f/u Dr. 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 Burr Medico -no lifting >10 pounds x 6 weeks  Nausea and vomiting: - improved post op - advance diet as tolerated - pt tolerated soft diet prior to d/c  Hypokalemia: - resolved  Normocytic anemia: -secondary to neoplasm and chemo (on Xeloda x almost 1 year) - stable.  Hypercalcemia (chronic) of malignancy  Discharge Condition: stable  Disposition: home Follow-up Information    Follow up with ROSENBOWER,TODD J, MD. Call in 2 weeks.   Specialty:  General Surgery   Why:  2-3 weeks for a post operative check up   Contact information:   [address] [name] [address] [phone]       Follow up with [facility].   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  she has refused surgical intervention multiple times since her diagnosis but has been receiving chemotherapy. Over the last 3 days prior to admission, she has had progressive worsening upper abd pain with increased distention and nausea, vomiting bilious material. Minimal flatus. No fevers. Ct scan shows tumor growth into/surrounding the stent causing obstruction. S/p palliative transverse loop colostomy on 4.25.2016. The patient did not have any immediate postoperative complications. Her diet was gradually advanced which she ultimately tolerated. Her pain was controlled ultimately with oral medications. Teaching was provided for changing her ostomy. She will follow-up with general surgery, Dr. Zella Richer in 2 weeks Consultants: General surgery  Discharge Exam: Filed Vitals:   10/31/14 0553  BP: 109/44  Pulse: 83  Temp: 98.4 F (36.9 C)  Resp: 16   Filed Vitals:   10/30/14 0527 10/30/14 1400 10/30/14 2148 10/31/14 0553  BP: 109/49 120/42 108/57 109/44  Pulse: 78 98 91 83  Temp: 98.3 F (36.8 C) 97.8 F (36.6 C) 98.8 F (37.1 C) 98.4 F (36.9 C)  TempSrc: Oral Oral Oral Oral  Resp: 18 16 18 16   Height:      Weight:    66.8 kg (147 lb 4.3 oz)  SpO2: 99% 98%  99%   General: A&O x 3, NAD, pleasant, cooperative Cardiovascular: RRR,  no rub, no gallop, no S3 Respiratory: CTAB, no wheeze, no rhonchi Abdomen:soft, nontender, nondistended, positive bowel sounds Extremities: No edema, No lymphangitis, no petechiae  Discharge Instructions      Discharge Instructions    Diet - low sodium heart healthy    Complete by:  As directed      Discharge instructions    Complete by:  As directed   Do Not take Xeloda until you follow up with Dr. Burr Medico in the [facility]     Increase activity  slowly    Complete by:  As directed             Medication List    STOP taking these medications        HYDROcodone-acetaminophen 5-325 MG per tablet  Commonly known as:  NORCO     loperamide 2 MG tablet  Commonly known as:  IMODIUM A-D      TAKE these medications        capecitabine 500 MG tablet  Commonly known as:  XELODA  Take 3 tablets (1,500 mg total) by mouth 2 (two) times daily after a meal.     docusate sodium 100 MG capsule  Commonly known as:  COLACE  Take 200 mg by mouth daily as needed for mild constipation.     lidocaine-prilocaine cream  Commonly known as:  EMLA  Apply 1 application topically as needed. Apply to port site one hour before treatment and cover with plastic wrap.     LORazepam 0.5 MG tablet  Commonly known as:  ATIVAN  Take 1 tablet (0.5 mg total) by mouth every 8 (eight) hours as needed (nausea/vomiting).     ondansetron 8 MG tablet  Commonly known as:  ZOFRAN  Take 8mg  by mouth every 12 hours as needed for nausea/vomiting.  Take 8 mg by mouth  30 minutes before taking chemo pills, and as needed for nausea/vomiting.     oxyCODONE 5 MG immediate release tablet  Commonly known as:  Oxy IR/ROXICODONE  Take 1-2 tablets (5-10 mg total) by mouth every 4 (four) hours as needed for moderate pain, severe pain or breakthrough pain.     polyethylene glycol powder powder  Commonly known as:  GLYCOLAX/MIRALAX  TAKE 34 G (2 DOSES) BY MOUTH DAILY AS NEEDED FOR MILD CONSTIPATION OR MODERATE CONSTIPATION     prochlorperazine 10 MG tablet  Commonly known as:  COMPAZINE  TAKE 1 TABLET (10 MG TOTAL) BY MOUTH EVERY 6 (SIX) HOURS AS NEEDED (NAUSEA OR VOMITING).         The results of significant diagnostics from this hospitalization (including imaging, microbiology, ancillary and laboratory) are listed below for reference.    Significant Diagnostic Studies: Dg Abd 1 View  10/24/2014   CLINICAL DATA:  NG tube placement  EXAM: ABDOMEN - 1 VIEW   COMPARISON:  10/24/2014 CT  FINDINGS: The NG tube tip projects over the gastric fundus. A central venous catheter tip projects over the cavoatrial junction. A bowel stent projects over the region of the rectosigmoid colon. There is gaseous distention of proximal colon, in keeping with delayed transit. Previously ingested contrast opacifies loops of small bowel within the pelvis. Small bowel loops are of normal caliber. No acute osseous finding.  IMPRESSION: NG tube tip projects over the stomach.  Distention of colon to the level of the sigmoid stent, in keeping with delayed transit / near complete obstruction.   Electronically Signed   By: Carlos Levering M.D.   On: 10/24/2014  22:06   Ct Abdomen Pelvis W Contrast  10/24/2014   CLINICAL DATA:  Diffuse abdominal pain and constipation for the past 4 days. Abdominal distention. Nausea and vomiting. Colonic stent placed in February of this year. 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  EXAM: CT ABDOMEN AND PELVIS WITH CONTRAST  TECHNIQUE: Multidetector CT imaging of the abdomen and pelvis was performed using the standard protocol following bolus administration of intravenous contrast.  CONTRAST:  168mL OMNIPAQUE IOHEXOL 300 MG/ML  SOLN  COMPARISON:  Radiographs obtained earlier today. Abdomen and pelvis CTs, the most recent dated 08/17/2014. Abdomen MR dated 05/24/2013.  FINDINGS: 1.9 cm arm rounded, low density in the mass with peripheral contrast puddling in the right lobe of the liver. 7.0 cm similar appearing mass more inferiorly in the right lobe. There are also several small probable cysts in the liver, all without significant change. A 1.0 cm calcification in the superior aspect of the junction of the medial and lateral segments of the left lobe is also unchanged. This is at the location of a 3.3 cm probable metastasis seen on 05/22/2013. That mass is no longer seen.  A 5.9 x 5.1 cm oval, mildly  heterogeneous mass is noted in the spleen on the left, measured on image number 23. This measured 4.5 x 4.0 cm 05/22/2013.  Dilated, stool and gas filled colon to the level of a sigmoid colon stent. There is soft tissue thickening within the lumen of the stent with a nodular appearance, causing almost complete obstruction of the lumen with no low stool seen distally.  Left para-aortic lymph node with a short axis diameter of 7 mm on image number 50 without significant change since 08/17/2014. No intervally enlarged lymph nodes.  Tiny calculus in the mid to upper left kidney. There is also a 7 mm calculus in the mid to lower left kidney.  No gastric or small bowel dilatation. Minimal free peritoneal fluid. Normal appearing adrenal glands, pancreas and gallbladder. Normal appearing appendix. No evidence of bony metastatic disease. Lumbar and lower thoracic spine degenerative changes. Mildly prominent interstitial markings at the lung bases. Minimal dependent atelectasis at both lung bases. The previously noted left lower lobe nodule is not currently included.  IMPRESSION: 1. 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 2. Minimal free peritoneal fluid. 3. 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 4. Treated metastasis in the left lobe of the liver. 5. Previously demonstrated hemangiomas in the right lobe of the liver and probable small liver cysts. 6. Stable small probable metastatic left para-aortic lymph node.   Electronically Signed   By: Claudie Revering M.D.   On: 10/24/2014 20:13   Dg Abd Acute W/chest  10/24/2014   CLINICAL DATA:  Intermittent abdominal pain since Tuesday. Nausea, vomiting and constipation. History of colon cancer.  EXAM: DG ABDOMEN ACUTE W/ 1V  CHEST  COMPARISON:  08/19/2014; 08/05/2014; CT abdomen pelvis - 08/17/2014  FINDINGS: Grossly unchanged cardiac silhouette and mediastinal contours. No focal parenchymal opacities. No pleural effusion or pneumothorax. No evidence of edema. Stable position of support apparatus.  A presumed colonic stent overlies the left hemipelvis. There is moderate upstream gaseous distention of the colon. There is no significant  gaseous distention of the small bowel. No pneumoperitoneum, pneumatosis or portal venous gas.  Several phleboliths overlie the left hemipelvis. Otherwise, no abnormal intra-abdominal calcifications.  No acute osseus abnormalities. Degenerative change of the pubic symphysis.  IMPRESSION: A presumed colonic stent overlies the left hemipelvis. This is associated with moderate upstream distention of the more proximal colon without definitive distention of the small bowel. While potentially representative an ileus, on early distal colonic obstruction could have a similar appearance. Clinical correlation is advised.   Electronically Signed   By: Sandi Mariscal M.D.   On: 10/24/2014 16:27     Microbiology: Recent Results (from the past 240 hour(s))  Surgical pcr screen     Status: Abnormal   Collection Time: 10/28/14  9:34 AM  Result Value Ref Range Status   MRSA, PCR NEGATIVE NEGATIVE Final   Staphylococcus aureus POSITIVE (A) NEGATIVE Final    Comment:        The Xpert SA Assay (FDA approved for NASAL specimens in patients over 109 years of age), is one component of a comprehensive surveillance program.  Test performance has been validated by [facility] for patients greater than or equal to 13 year old. It is not intended to diagnose infection nor to guide or monitor treatment.      Labs: Basic Metabolic Panel:  Recent Labs Lab 10/26/14 1010 10/27/14 0518 10/28/14 0600 10/28/14 1445 10/29/14 0543 10/30/14 0535  NA 137 136  --  139 135 138  K 3.2* 2.9*  --  3.8 4.2 4.3  CL 106  104  --  106 106 105  CO2 23 27  --  25 26 26   GLUCOSE 157* 130*  --  120* 108* 100*  BUN 11 11  --  11 10 7   CREATININE 0.59 0.63  --  0.71 0.57 0.54  CALCIUM 10.1 9.9  --  9.6 9.4 9.8  MG  --   --  2.6*  --   --   --    Liver Function Tests:  Recent Labs Lab 10/24/14 1444 10/27/14 0518  AST 19 12  ALT 11 8  ALKPHOS 72 53  BILITOT 0.1* 0.2*  PROT 8.5* 6.9  ALBUMIN 3.7 3.0*   No results for input(s): LIPASE, AMYLASE in the last 168 hours. No results for input(s): AMMONIA in the last 168 hours. CBC:  Recent Labs Lab 10/24/14 1444 10/25/14 0555 10/27/14 0518 10/28/14 0600 10/29/14 0543 10/30/14 0535  WBC 6.6 6.1 6.0 6.8 7.1 6.2  NEUTROABS 4.2  --   --  5.0  --   --   HGB 11.4* 10.8* 10.4* 11.5* 10.4* 10.1*  HCT 35.2* 33.8* 32.6* 35.7* 32.1* 31.4*  MCV 84.8 86.0 85.3 85.2 84.9 85.8  PLT 288 280 326 298 303 306   Cardiac Enzymes: No results for input(s): CKTOTAL, CKMB, CKMBINDEX, TROPONINI in the last 168 hours. BNP: Invalid input(s): POCBNP CBG: No results for input(s): GLUCAP in the last 168 hours.  Time coordinating discharge:  Greater than 30 minutes  Signed:  Luticia Tadros, DO [facility] Pager: [phone] 10/31/2014, 11:17 AM

## 2014-11-01 ENCOUNTER — Ambulatory Visit: Payer: Medicaid Other | Admitting: Physician Assistant

## 2014-11-01 ENCOUNTER — Other Ambulatory Visit: Payer: Medicaid Other

## 2014-11-03 DIAGNOSIS — I2699 Other pulmonary embolism without acute cor pulmonale: Secondary | ICD-10-CM

## 2014-11-03 HISTORY — DX: Other pulmonary embolism without acute cor pulmonale: I26.99

## 2014-11-04 ENCOUNTER — Telehealth: Payer: Self-pay | Admitting: *Deleted

## 2014-11-04 NOTE — Telephone Encounter (Signed)
Myrtle,  Please let her know that I do not want her to restart Xeloda, until I see her on 5/20. I canceled her CT abdomen and pelvis (she had one when she was in hospital before surgery), but kept her CT chest appointment, a few days before her appointment with me.   Thanks.  Truitt Merle MD  11/04/2014

## 2014-11-04 NOTE — Telephone Encounter (Signed)
Called pt & given info per Dr Ernestina Penna note below.  Pt knows to get CT chest only before next MD visit.

## 2014-11-04 NOTE — Telephone Encounter (Signed)
Ms. [name] [name] called asking "is it okay not to resume Xeloda today but wait until next F/U visit on 11-22-2014".  S/P colostomy surgery and "needs time to get adjusted to this".  Will notify Dr. Burr Medico.  Return number [phone]

## 2014-11-13 ENCOUNTER — Emergency Department (HOSPITAL_COMMUNITY): Payer: Medicaid Other

## 2014-11-13 ENCOUNTER — Inpatient Hospital Stay (HOSPITAL_COMMUNITY)
Admission: EM | Admit: 2014-11-13 | Discharge: 2014-11-16 | DRG: 374 | Disposition: A | Discharge status: Home Health | Payer: Medicaid Other | Attending: Surgery | Admitting: Surgery

## 2014-11-13 ENCOUNTER — Encounter (HOSPITAL_COMMUNITY): Payer: Self-pay | Admitting: Emergency Medicine

## 2014-11-13 DIAGNOSIS — C19 Malignant neoplasm of rectosigmoid junction: Principal | ICD-10-CM | POA: Diagnosis present

## 2014-11-13 DIAGNOSIS — K7689 Other specified diseases of liver: Secondary | ICD-10-CM | POA: Diagnosis present

## 2014-11-13 DIAGNOSIS — Z515 Encounter for palliative care: Secondary | ICD-10-CM

## 2014-11-13 DIAGNOSIS — K9409 Other complications of colostomy: Secondary | ICD-10-CM | POA: Diagnosis present

## 2014-11-13 DIAGNOSIS — Z808 Family history of malignant neoplasm of other organs or systems: Secondary | ICD-10-CM

## 2014-11-13 DIAGNOSIS — F419 Anxiety disorder, unspecified: Secondary | ICD-10-CM | POA: Diagnosis present

## 2014-11-13 DIAGNOSIS — Z90710 Acquired absence of both cervix and uterus: Secondary | ICD-10-CM

## 2014-11-13 DIAGNOSIS — R0602 Shortness of breath: Secondary | ICD-10-CM

## 2014-11-13 DIAGNOSIS — Z803 Family history of malignant neoplasm of breast: Secondary | ICD-10-CM

## 2014-11-13 DIAGNOSIS — R1110 Vomiting, unspecified: Secondary | ICD-10-CM

## 2014-11-13 DIAGNOSIS — D6859 Other primary thrombophilia: Secondary | ICD-10-CM | POA: Diagnosis present

## 2014-11-13 DIAGNOSIS — C787 Secondary malignant neoplasm of liver and intrahepatic bile duct: Secondary | ICD-10-CM | POA: Diagnosis present

## 2014-11-13 DIAGNOSIS — Z800 Family history of malignant neoplasm of digestive organs: Secondary | ICD-10-CM

## 2014-11-13 DIAGNOSIS — I2699 Other pulmonary embolism without acute cor pulmonale: Secondary | ICD-10-CM | POA: Diagnosis present

## 2014-11-13 DIAGNOSIS — Z87891 Personal history of nicotine dependence: Secondary | ICD-10-CM

## 2014-11-13 DIAGNOSIS — Z885 Allergy status to narcotic agent status: Secondary | ICD-10-CM

## 2014-11-13 DIAGNOSIS — R109 Unspecified abdominal pain: Secondary | ICD-10-CM

## 2014-11-13 LAB — COMPREHENSIVE METABOLIC PANEL
ALT: 9 U/L — ABNORMAL LOW (ref 14–54)
AST: 16 U/L (ref 15–41)
Albumin: 3.5 g/dL (ref 3.5–5.0)
Alkaline Phosphatase: 61 U/L (ref 38–126)
Anion gap: 9 (ref 5–15)
BUN: 13 mg/dL (ref 6–20)
CALCIUM: 10.6 mg/dL — AB (ref 8.9–10.3)
CO2: 21 mmol/L — AB (ref 22–32)
Chloride: 109 mmol/L (ref 101–111)
Creatinine, Ser: 0.9 mg/dL (ref 0.44–1.00)
GFR calc non Af Amer: >60 mL/min (ref >60)
Glucose, Bld: 97 mg/dL (ref 70–99)
POTASSIUM: 3.1 mmol/L — AB (ref 3.5–5.1)
SODIUM: 139 mmol/L (ref 135–145)
TOTAL PROTEIN: 8 g/dL (ref 6.5–8.1)
Total Bilirubin: 0.6 mg/dL (ref 0.3–1.2)

## 2014-11-13 LAB — URINALYSIS, ROUTINE W REFLEX MICROSCOPIC
BILIRUBIN URINE: NEGATIVE
GLUCOSE, UA: NEGATIVE mg/dL
HGB URINE DIPSTICK: NEGATIVE
Ketones, ur: 15 mg/dL — AB
Leukocytes, UA: NEGATIVE
Nitrite: NEGATIVE
PROTEIN: NEGATIVE mg/dL
Specific Gravity, Urine: 1.016 (ref 1.005–1.030)
Urobilinogen, UA: 0.2 mg/dL (ref 0.0–1.0)
pH: 7 (ref 5.0–8.0)

## 2014-11-13 LAB — CBC WITH DIFFERENTIAL/PLATELET
BASOS PCT: 0 % (ref 0–1)
Basophils Absolute: 0 10*3/uL (ref 0.0–0.1)
EOS ABS: 0.2 10*3/uL (ref 0.0–0.7)
Eosinophils Relative: 3 % (ref 0–5)
HCT: 34.6 % — ABNORMAL LOW (ref 36.0–46.0)
Hemoglobin: 11.1 g/dL — ABNORMAL LOW (ref 12.0–15.0)
LYMPHS ABS: 1.8 10*3/uL (ref 0.7–4.0)
Lymphocytes Relative: 24 % (ref 12–46)
MCH: 26.7 pg (ref 26.0–34.0)
MCHC: 32.1 g/dL (ref 30.0–36.0)
MCV: 83.2 fL (ref 78.0–100.0)
Monocytes Absolute: 0.4 10*3/uL (ref 0.1–1.0)
Monocytes Relative: 5 % (ref 3–12)
NEUTROS PCT: 68 % (ref 43–77)
Neutro Abs: 5.2 10*3/uL (ref 1.7–7.7)
PLATELETS: 360 10*3/uL (ref 150–400)
RBC: 4.16 MIL/uL (ref 3.87–5.11)
RDW: 12.9 % (ref 11.5–15.5)
WBC: 7.6 10*3/uL (ref 4.0–10.5)

## 2014-11-13 LAB — PROTIME-INR
INR: 1.05 (ref 0.00–1.49)
PROTHROMBIN TIME: 13.9 s (ref 11.6–15.2)

## 2014-11-13 LAB — APTT: APTT: 24 s (ref 24–37)

## 2014-11-13 MED ORDER — IOHEXOL 300 MG/ML  SOLN
100.0000 mL | Freq: Once | INTRAMUSCULAR | Status: AC | PRN
  Administered 2014-11-13: 100 mL via INTRAVENOUS

## 2014-11-13 MED ORDER — HEPARIN (PORCINE) IN NACL 100-0.45 UNIT/ML-% IJ SOLN
1100.0000 [IU]/h | INTRAMUSCULAR | Status: DC
  Administered 2014-11-13: 1100 [IU]/h via INTRAVENOUS
  Filled 2014-11-13: qty 250

## 2014-11-13 MED ORDER — HYDROMORPHONE HCL 1 MG/ML IJ SOLN
1.0000 mg | Freq: Once | INTRAMUSCULAR | Status: AC
  Administered 2014-11-13: 1 mg via INTRAVENOUS
  Filled 2014-11-13: qty 1

## 2014-11-13 MED ORDER — ONDANSETRON HCL 4 MG/2ML IJ SOLN
4.0000 mg | Freq: Once | INTRAMUSCULAR | Status: AC
  Administered 2014-11-13: 4 mg via INTRAVENOUS
  Filled 2014-11-13: qty 2

## 2014-11-13 MED ORDER — MORPHINE SULFATE 4 MG/ML IJ SOLN
4.0000 mg | Freq: Once | INTRAMUSCULAR | Status: AC
  Administered 2014-11-13: 4 mg via INTRAVENOUS
  Filled 2014-11-13: qty 1

## 2014-11-13 MED ORDER — HEPARIN BOLUS VIA INFUSION
2000.0000 [IU] | Freq: Once | INTRAVENOUS | Status: AC
  Administered 2014-11-14: 2000 [IU] via INTRAVENOUS
  Filled 2014-11-13: qty 2000

## 2014-11-13 MED ORDER — SODIUM CHLORIDE 0.9 % IJ SOLN
10.0000 mL | INTRAMUSCULAR | Status: DC | PRN
  Administered 2014-11-14: 20 mL
  Administered 2014-11-14 – 2014-11-15 (×2): 10 mL
  Filled 2014-11-13 (×3): qty 40

## 2014-11-13 MED ORDER — SODIUM CHLORIDE 0.9 % IJ SOLN
INTRAMUSCULAR | Status: AC
  Filled 2014-11-13: qty 10

## 2014-11-13 MED ORDER — DIAZEPAM 5 MG/ML IJ SOLN
5.0000 mg | Freq: Once | INTRAMUSCULAR | Status: AC
  Administered 2014-11-13: 5 mg via INTRAVENOUS
  Filled 2014-11-13: qty 2

## 2014-11-13 MED ORDER — SODIUM CHLORIDE 0.9 % IV BOLUS (SEPSIS)
1000.0000 mL | Freq: Once | INTRAVENOUS | Status: AC
Start: 2014-11-13 — End: 2014-11-13
  Administered 2014-11-13: 1000 mL via INTRAVENOUS

## 2014-11-13 MED ORDER — IOHEXOL 350 MG/ML SOLN
100.0000 mL | Freq: Once | INTRAVENOUS | Status: AC | PRN
  Administered 2014-11-13: 100 mL via INTRAVENOUS

## 2014-11-13 NOTE — ED Provider Notes (Addendum)
CSN: [id number]     Arrival date & time 11/13/14  1748 History   First MD Initiated Contact with Patient 11/13/14 1803     Chief Complaint  Patient presents with  . Abdominal Pain     (Consider location/radiation/quality/duration/timing/severity/associated sxs/prior Treatment) The history is provided by the patient.  [name] [name] is a 64 y.o. female hx of colon cancer with hysterectomy, sigmoid stent with obstruction s/p transverse loop colostomy on 4/25 here presenting with abdominal pain. She noticed that there is a hernia through the colostomy for the last 3 days. It gets worse when she coughs. She has worsening abdominal pain for the last 3 days and today she was unable to keep anything down and has severe pain. Denies any fevers. Didn't notice any stool in the colostomy. Recent surgery done by Dr. Barkley Bruns.    Past Medical History  Diagnosis Date  . Anxiety   . Colon cancer     dx'd 2014   Past Surgical History  Procedure Laterality Date  . Abdominal hysterectomy  2005  . Cesarean section  1976  . Flexible sigmoidoscopy N/A 05/25/2013    Procedure: FLEXIBLE SIGMOIDOSCOPY;  Surgeon: [name], MD;  Location: [facility];  Service: Endoscopy;  Laterality: N/A;  needs floro  . Colonic stent placement N/A 05/25/2013    Procedure: COLONIC STENT PLACEMENT;  Surgeon: [name], MD;  Location: [facility];  Service: Endoscopy;  Laterality: N/A;  . Portacath placement Right 06/25/2013    Procedure: ULTRA SOUND GUIDED INSERTION PORT-A-CATH;  Surgeon: [name], MD;  Location: [place];  Service: General;  Laterality: Right;  . Flexible sigmoidoscopy N/A 08/19/2014    Procedure: FLEXIBLE SIGMOIDOSCOPY;  Surgeon: Gatha Mayer, MD;  Location: [facility];  Service: Endoscopy;  Laterality: N/A;  . Colonic stent placement N/A 08/19/2014    Procedure: COLONIC STENT PLACEMENT;  Surgeon: Gatha Mayer, MD;  Location: [facility];  Service:  Endoscopy;  Laterality: N/A;  . Flexible sigmoidoscopy N/A 08/19/2014    Procedure: FLEXIBLE SIGMOIDOSCOPY;  Surgeon: Gatha Mayer, MD;  Location: [facility];  Service: Endoscopy;  Laterality: N/A;  . Colonic stent placement N/A 08/19/2014    Procedure: COLONIC STENT PLACEMENT;  Surgeon: Gatha Mayer, MD;  Location: [facility];  Service: Endoscopy;  Laterality: N/A;  . Colon resection N/A 10/28/2014    Procedure: Transverse loop colostomy;  Surgeon: [name], MD;  Location: [facility];  Service: General;  Laterality: N/A;   Family History  Problem Relation Age of Onset  . Colon cancer Sister 73  . Diabetes Neg Hx   . Thyroid disease Neg Hx   . Breast cancer Maternal Aunt   . Prostate cancer Maternal Uncle   . Bone cancer Maternal Grandfather   . Prostate cancer Maternal Uncle    History  Substance Use Topics  . Smoking status: Former Smoker -- 1 years    Types: Cigarettes    Quit date: 05/24/1971  . Smokeless tobacco: Never Used  . Alcohol Use: No   OB History    No data available     Review of Systems  Gastrointestinal: Positive for abdominal pain.  All other systems reviewed and are negative.     Allergies  Oxycontin and Codeine  Home Medications   Prior to Admission medications   Medication Sig Start Date End Date Taking? Authorizing Provider  Aspirin-Salicylamide-Caffeine (BC HEADACHE POWDER PO) Take 1 packet by mouth every 4 (four) hours as needed (headache).  Yes Historical Provider, MD  diphenhydrAMINE (BENADRYL) 25 MG tablet Take 50 mg by mouth every 6 (six) hours as needed for allergies.   Yes Historical Provider, MD  lidocaine-prilocaine (EMLA) cream Apply 1 application topically as needed. Apply to port site one hour before treatment and cover with plastic wrap. 07/02/13  Yes Concha Norway, MD  polyethylene glycol powder (GLYCOLAX/MIRALAX) powder TAKE 34 G (2 DOSES) BY MOUTH DAILY AS NEEDED FOR MILD CONSTIPATION OR MODERATE CONSTIPATION Patient taking  differently: TAKE 1-2 DOSES BY MOUTH DAILY AS NEEDED FOR MILD CONSTIPATION OR MODERATE CONSTIPATION 03/03/14  Yes Concha Norway, MD  Wheat Dextrin (BENEFIBER PO) Take 1 tablet by mouth daily as needed (constipation).   Yes Historical Provider, MD  capecitabine (XELODA) 500 MG tablet Take 3 tablets (1,500 mg total) by mouth 2 (two) times daily after a meal. Patient not taking: Reported on 11/13/2014 09/13/14   Truitt Merle, MD  LORazepam (ATIVAN) 0.5 MG tablet Take 1 tablet (0.5 mg total) by mouth every 8 (eight) hours as needed (nausea/vomiting). 04/22/14   Aasim Lona Kettle, MD  ondansetron (ZOFRAN) 8 MG tablet Take 8mg  by mouth every 12 hours as needed for nausea/vomiting.  Take 8 mg by mouth  30 minutes before taking chemo pills, and as needed for nausea/vomiting. 07/15/14   Truitt Merle, MD  oxyCODONE (OXY IR/ROXICODONE) 5 MG immediate release tablet Take 1-2 tablets (5-10 mg total) by mouth every 4 (four) hours as needed for moderate pain, severe pain or breakthrough pain. 10/31/14   Orson Eva, MD  prochlorperazine (COMPAZINE) 10 MG tablet TAKE 1 TABLET (10 MG TOTAL) BY MOUTH EVERY 6 (SIX) HOURS AS NEEDED (NAUSEA OR VOMITING). 01/01/14   Concha Norway, MD   BP 127/70 mmHg  Pulse 102  Temp(Src) 98.1 F (36.7 C) (Oral)  Resp 16  SpO2 96% Physical Exam  Constitutional: She is oriented to person, place, and time.  Uncomfortable, retching   HENT:  Head: Normocephalic.  MM dry   Eyes: Conjunctivae are normal. Pupils are equal, round, and reactive to light.  Neck: Normal range of motion. Neck supple.  Cardiovascular: Normal rate, regular rhythm and normal heart sounds.   Pulmonary/Chest: Effort normal and breath sounds normal. No respiratory distress. She has no wheezes. She has no rales.  Abdominal:  Distended. Obvious hernia through the colostomy, not reducible   Musculoskeletal: Normal range of motion.  Neurological: She is alert and oriented to person, place, and time.  Skin: Skin is warm and dry.   Psychiatric: She has a normal mood and affect. Her behavior is normal. Judgment and thought content normal.  Nursing note and vitals reviewed.   ED Course  Procedures (including critical care time)  CRITICAL CARE Performed by: Darl Householder, Orville Mena   Total critical care time:30 min   Critical care time was exclusive of separately billable procedures and treating other patients.  Critical care was necessary to treat or prevent imminent or life-threatening deterioration.  Critical care was time spent personally by me on the following activities: development of treatment plan with patient and/or surrogate as well as nursing, discussions with consultants, evaluation of patient's response to treatment, examination of patient, obtaining history from patient or surrogate, ordering and performing treatments and interventions, ordering and review of laboratory studies, ordering and review of radiographic studies, pulse oximetry and re-evaluation of patient's condition.   Labs Review Labs Reviewed  CBC WITH DIFFERENTIAL/PLATELET - Abnormal; Notable for the following:    Hemoglobin 11.1 (*)    HCT 34.6 (*)  All other components within normal limits  COMPREHENSIVE METABOLIC PANEL - Abnormal; Notable for the following:    Potassium 3.1 (*)    CO2 21 (*)    Calcium 10.6 (*)    ALT 9 (*)    All other components within normal limits  URINALYSIS, ROUTINE W REFLEX MICROSCOPIC - Abnormal; Notable for the following:    Ketones, ur 15 (*)    All other components within normal limits  APTT  PROTIME-INR  HEPARIN LEVEL (UNFRACTIONATED)  CBC    Imaging Review Ct Abdomen Pelvis W Contrast  11/13/2014   CLINICAL DATA:  Colon cancer. Abdominal pain for 1 week. Nausea and vomiting. Unable to drink oral contrast material.  EXAM: CT ABDOMEN AND PELVIS WITH CONTRAST  TECHNIQUE: Multidetector CT imaging of the abdomen and pelvis was performed using the standard protocol following bolus administration of  intravenous contrast.  CONTRAST:  153mL OMNIPAQUE IOHEXOL 300 MG/ML  SOLN  COMPARISON:  10/24/2014  FINDINGS: There is suggestion of a focal filling defect in a visualized right lower lobe pulmonary artery. There is also a small wedge-shaped peripheral infiltrate in the right lung base. Findings may indicate pulmonary embolus. If clinically suspicious, CT pulmonary embolus study of the chest is suggested.  The liver demonstrates multiple focal lesions. In segment 4 of the liver, there is a calcified lesion measuring about 1 cm diameter corresponding to unknown treated metastasis. This is not change since prior study. In segment 7 of the liver towards the dome, there is a 16 mm lesion which has enhancement features consistent with cavernous hemangioma. In segment 5-6 of the liver, there is a 8.1 cm diameter lesion which has enhancement characteristics most consistent with cavernous hemangioma. Tiny sub cm lesions are also present likely representing cysts. Lesions are similar to prior study. Heterogeneous mass lesion in the anterior spleen measuring 5.5 cm diameter consistent with metastasis. No significant change in appearance. Small accessory spleen. The gallbladder, pancreas, adrenal glands, abdominal aorta, and inferior vena cava are unremarkable. Scattered perirectal lymph nodes are upper limits of normal size and may be metastatic. 8 mm stone in the lower pole left kidney. No hydronephrosis or hydroureter in either kidney. Punctate stone also in the upper pole left kidney. No change since prior study. Stomach and small bowel are not abnormally distended. There is a a right lower quadrant ileostomy, new since prior study. There is a large peristomal hernia containing fat. The colon is diffusely stool-filled and has decompressed since previous study. No free air or free fluid in the abdomen. Postoperative changes along the midline.  Pelvis: Sigmoid colon mass consistent with known cancer. Wall stent is in place.  Tumor appears to have invaded the stent and infiltrated into the pericolonic fat. Appearance is similar to prior study. Proximal colonic dilatation has decreased, likely due to the diverting colostomy. Small amount of free fluid in the pelvis is likely reactive. Uterus appears surgically absent. Appendix is normal. Bladder wall is not thickened. Degenerative changes in the spine. No destructive bone lesions appreciated.  IMPRESSION: Sigmoid colon carcinoma with wall stent in place. Tumor invades the stent and into the surrounding pericolonic fat. Proximal stool-filled colon is more decompressed than on prior study with interval placement of a diverting right ileostomy. Peristomal hernia containing fat. No proximal gastric or small bowel dilatation to suggest obstruction. Multiple liver lesions including a stable treated metastasis and multiple cavernous hemangiomas. 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 Nonobstructing stones in the left kidney. Changes in the  right lung base as described are worrisome for pulmonary embolus. If clinically indicated, CT PE study of the chest is suggested.  These results were called by telephone at the time of interpretation on 11/13/2014 at 9:57 pm to Dr. Shirlyn Goltz , who verbally acknowledged these results.   Electronically Signed   By: Lucienne Capers M.D.   On: 11/13/2014 22:02   Dg Abd Acute W/chest  11/13/2014   CLINICAL DATA:  Severe abdominal pain and vomiting. Previous history of colostomy for colon carcinoma.  EXAM: DG ABDOMEN ACUTE W/ 1V CHEST  COMPARISON:  Radiographs and CT on 10/24/2014  FINDINGS: No evidence of dilated bowel loops. An intraluminal stent is again seen within the distal sigmoid colon. A new right abdominal colostomy is seen with large associated soft tissue herniation, consistent with parastomal hernia. No evidence free intraperitoneal air.  IMPRESSION: Nonobstructive bowel gas pattern. Right-sided colostomy shows large associated soft  tissue herniation, consistent with parastomal hernia. Consider abdomen and pelvis CT for further evaluation if clinically warranted.   Electronically Signed   By: Earle Gell M.D.   On: 11/13/2014 19:15     EKG Interpretation None      MDM   Final diagnoses:  Vomiting  Abdominal pain  Shortness of breath    [name] CLOSSER is a 64 y.o. female here with vomiting, abdominal pain. Concerned for SBO from the hernia or incarcerated hernia. Will get xray, CT, and consult surgery.   10:50 PM  CT showed peristomal hernia containing fat. No SBO. Consulted Dr. Ninfa Linden from surgery. He recommend medicine admission and he will see patient. Dr. Roel Cluck to admit. On the CT, there is noted to have possible PE. Will start heparin empirically and order dedicated CT angio.   12 pm Dr. Ninfa Linden at bedside, thinks likely prolapse. Tried to reduce but not successful. He will admit. CT angio confirmed PE. Started on heparin.    Wandra Arthurs, MD 11/13/14 2322  Wandra Arthurs, MD 11/14/14 [phone]

## 2014-11-13 NOTE — ED Notes (Signed)
Discussed with Dr. Darl Householder about pain management for patient.  Patient continues to moan in pain/notified CT, patient unable to drink oral contrast, to just do IV contrast per Dr/ Darl Householder.

## 2014-11-13 NOTE — H&P (Signed)
[name] [name] is an 64 y.o. female.   Chief Complaint: abdominal pain HPI: This is an unfortunate 64 year old female with a history of a distal colon cancer that was strictured. An endoscopic stent was placed however this is now involved with malignancy. She has stage IV disease. She most recently underwent a palliative loop transverse colostomy. She has actually had prolapse of the ostomy which Dr. Zella Richer was able to reduce in the office.  Over the last several days, the ostomy has started to prolapse further. She has had some abdominal pain with nausea and vomiting. She has not had stool output for several days.  Past Medical History  Diagnosis Date  . Anxiety   . Colon cancer     dx'd 2014    Past Surgical History  Procedure Laterality Date  . Abdominal hysterectomy  2005  . Cesarean section  1976  . Flexible sigmoidoscopy N/A 05/25/2013    Procedure: FLEXIBLE SIGMOIDOSCOPY;  Surgeon: [name], MD;  Location: [facility];  Service: Endoscopy;  Laterality: N/A;  needs floro  . Colonic stent placement N/A 05/25/2013    Procedure: COLONIC STENT PLACEMENT;  Surgeon: [name], MD;  Location: [facility];  Service: Endoscopy;  Laterality: N/A;  . Portacath placement Right 06/25/2013    Procedure: ULTRA SOUND GUIDED INSERTION PORT-A-CATH;  Surgeon: [name], MD;  Location: [place];  Service: General;  Laterality: Right;  . Flexible sigmoidoscopy N/A 08/19/2014    Procedure: FLEXIBLE SIGMOIDOSCOPY;  Surgeon: Gatha Mayer, MD;  Location: [facility];  Service: Endoscopy;  Laterality: N/A;  . Colonic stent placement N/A 08/19/2014    Procedure: COLONIC STENT PLACEMENT;  Surgeon: Gatha Mayer, MD;  Location: [facility];  Service: Endoscopy;  Laterality: N/A;  . Flexible sigmoidoscopy N/A 08/19/2014    Procedure: FLEXIBLE SIGMOIDOSCOPY;  Surgeon: Gatha Mayer, MD;  Location: [facility];  Service: Endoscopy;  Laterality: N/A;  . Colonic stent  placement N/A 08/19/2014    Procedure: COLONIC STENT PLACEMENT;  Surgeon: Gatha Mayer, MD;  Location: [facility];  Service: Endoscopy;  Laterality: N/A;  . Colon resection N/A 10/28/2014    Procedure: Transverse loop colostomy;  Surgeon: [name], MD;  Location: [facility];  Service: General;  Laterality: N/A;    Family History  Problem Relation Age of Onset  . Colon cancer Sister 24  . Diabetes Neg Hx   . Thyroid disease Neg Hx   . Breast cancer Maternal Aunt   . Prostate cancer Maternal Uncle   . Bone cancer Maternal Grandfather   . Prostate cancer Maternal Uncle    Social History:  reports that she quit smoking about 43 years ago. Her smoking use included Cigarettes. She quit after 1 year of use. She has never used smokeless tobacco. She reports that she does not drink alcohol or use illicit drugs.  Allergies:  Allergies  Allergen Reactions  . Oxycontin [Oxycodone Hcl] Anaphylaxis, Hives and Itching  . Codeine Itching and Nausea And Vomiting     (Not in a hospital admission)  Results for orders placed or performed during the hospital encounter of 11/13/14 (from the past 48 hour(s))  CBC with Differential     Status: Abnormal   Collection Time: 11/13/14  6:15 PM  Result Value Ref Range   WBC 7.6 4.0 - 10.5 K/uL   RBC 4.16 3.87 - 5.11 MIL/uL   Hemoglobin 11.1 (L) 12.0 - 15.0 g/dL   HCT 34.6 (L) 36.0 - 46.0 %  MCV 83.2 78.0 - 100.0 fL   MCH 26.7 26.0 - 34.0 pg   MCHC 32.1 30.0 - 36.0 g/dL   RDW 12.9 11.5 - 15.5 %   Platelets 360 150 - 400 K/uL   Neutrophils Relative % 68 43 - 77 %   Neutro Abs 5.2 1.7 - 7.7 K/uL   Lymphocytes Relative 24 12 - 46 %   Lymphs Abs 1.8 0.7 - 4.0 K/uL   Monocytes Relative 5 3 - 12 %   Monocytes Absolute 0.4 0.1 - 1.0 K/uL   Eosinophils Relative 3 0 - 5 %   Eosinophils Absolute 0.2 0.0 - 0.7 K/uL   Basophils Relative 0 0 - 1 %   Basophils Absolute 0.0 0.0 - 0.1 K/uL  Comprehensive metabolic panel     Status: Abnormal   Collection  Time: 11/13/14  6:15 PM  Result Value Ref Range   Sodium 139 135 - 145 mmol/L   Potassium 3.1 (L) 3.5 - 5.1 mmol/L   Chloride 109 101 - 111 mmol/L   CO2 21 (L) 22 - 32 mmol/L   Glucose, Bld 97 70 - 99 mg/dL   BUN 13 6 - 20 mg/dL   Creatinine, Ser 0.90 0.44 - 1.00 mg/dL   Calcium 10.6 (H) 8.9 - 10.3 mg/dL   Total Protein 8.0 6.5 - 8.1 g/dL   Albumin 3.5 3.5 - 5.0 g/dL   AST 16 15 - 41 U/L   ALT 9 (L) 14 - 54 U/L   Alkaline Phosphatase 61 38 - 126 U/L   Total Bilirubin 0.6 0.3 - 1.2 mg/dL   GFR calc non Af Amer >60 >60 mL/min   GFR calc Af Amer >60 >60 mL/min    Comment: (NOTE) The eGFR has been calculated using the CKD EPI equation. This calculation has not been validated in all clinical situations. eGFR's persistently <60 mL/min signify possible Chronic Kidney Disease.    Anion gap 9 5 - 15  Urinalysis, Routine w reflex microscopic     Status: Abnormal   Collection Time: 11/13/14  8:08 PM  Result Value Ref Range   Color, Urine YELLOW YELLOW   APPearance CLEAR CLEAR   Specific Gravity, Urine 1.016 1.005 - 1.030   pH 7.0 5.0 - 8.0   Glucose, UA NEGATIVE NEGATIVE mg/dL   Hgb urine dipstick NEGATIVE NEGATIVE   Bilirubin Urine NEGATIVE NEGATIVE   Ketones, ur 15 (A) NEGATIVE mg/dL   Protein, ur NEGATIVE NEGATIVE mg/dL   Urobilinogen, UA 0.2 0.0 - 1.0 mg/dL   Nitrite NEGATIVE NEGATIVE   Leukocytes, UA NEGATIVE NEGATIVE    Comment: MICROSCOPIC NOT DONE ON URINES WITH NEGATIVE PROTEIN, BLOOD, LEUKOCYTES, NITRITE, OR GLUCOSE <1000 mg/dL.  APTT     Status: None   Collection Time: 11/13/14 11:02 PM  Result Value Ref Range   aPTT 24 24 - 37 seconds  Protime-INR     Status: None   Collection Time: 11/13/14 11:02 PM  Result Value Ref Range   Prothrombin Time 13.9 11.6 - 15.2 seconds   INR 1.05 0.00 - 1.49   Ct Abdomen Pelvis W Contrast  11/13/2014   CLINICAL DATA:  Colon cancer. Abdominal pain for 1 week. Nausea and vomiting. Unable to drink oral contrast material.  EXAM: CT  ABDOMEN AND PELVIS WITH CONTRAST  TECHNIQUE: Multidetector CT imaging of the abdomen and pelvis was performed using the standard protocol following bolus administration of intravenous contrast.  CONTRAST:  148m OMNIPAQUE IOHEXOL 300 MG/ML  SOLN  COMPARISON:  10/24/2014  FINDINGS: There is suggestion of a focal filling defect in a visualized right lower lobe pulmonary artery. There is also a small wedge-shaped peripheral infiltrate in the right lung base. Findings may indicate pulmonary embolus. If clinically suspicious, CT pulmonary embolus study of the chest is suggested.  The liver demonstrates multiple focal lesions. In segment 4 of the liver, there is a calcified lesion measuring about 1 cm diameter corresponding to unknown treated metastasis. This is not change since prior study. In segment 7 of the liver towards the dome, there is a 16 mm lesion which has enhancement features consistent with cavernous hemangioma. In segment 5-6 of the liver, there is a 8.1 cm diameter lesion which has enhancement characteristics most consistent with cavernous hemangioma. Tiny sub cm lesions are also present likely representing cysts. Lesions are similar to prior study. Heterogeneous mass lesion in the anterior spleen measuring 5.5 cm diameter consistent with metastasis. No significant change in appearance. Small accessory spleen. The gallbladder, pancreas, adrenal glands, abdominal aorta, and inferior vena cava are unremarkable. Scattered perirectal lymph nodes are upper limits of normal size and may be metastatic. 8 mm stone in the lower pole left kidney. No hydronephrosis or hydroureter in either kidney. Punctate stone also in the upper pole left kidney. No change since prior study. Stomach and small bowel are not abnormally distended. There is a a right lower quadrant ileostomy, new since prior study. There is a large peristomal hernia containing fat. The colon is diffusely stool-filled and has decompressed since previous  study. No free air or free fluid in the abdomen. Postoperative changes along the midline.  Pelvis: Sigmoid colon mass consistent with known cancer. Wall stent is in place. Tumor appears to have invaded the stent and infiltrated into the pericolonic fat. Appearance is similar to prior study. Proximal colonic dilatation has decreased, likely due to the diverting colostomy. Small amount of free fluid in the pelvis is likely reactive. Uterus appears surgically absent. Appendix is normal. Bladder wall is not thickened. Degenerative changes in the spine. No destructive bone lesions appreciated.  IMPRESSION: Sigmoid colon carcinoma with wall stent in place. Tumor invades the stent and into the surrounding pericolonic fat. Proximal stool-filled colon is more decompressed than on prior study with interval placement of a diverting right ileostomy. Peristomal hernia containing fat. No proximal gastric or small bowel dilatation to suggest obstruction. Multiple liver lesions including a stable treated metastasis and multiple cavernous hemangiomas. 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 Nonobstructing stones in the left kidney. Changes in the right lung base as described are worrisome for pulmonary embolus. If clinically indicated, CT PE study of the chest is suggested.  These results were called by telephone at the time of interpretation on 11/13/2014 at 9:57 pm to Dr. Shirlyn Goltz , who verbally acknowledged these results.   Electronically Signed   By: Lucienne Capers M.D.   On: 11/13/2014 22:02   Dg Abd Acute W/chest  11/13/2014   CLINICAL DATA:  Severe abdominal pain and vomiting. Previous history of colostomy for colon carcinoma.  EXAM: DG ABDOMEN ACUTE W/ 1V CHEST  COMPARISON:  Radiographs and CT on 10/24/2014  FINDINGS: No evidence of dilated bowel loops. An intraluminal stent is again seen within the distal sigmoid colon. A new right abdominal colostomy is seen with large associated soft tissue herniation,  consistent with parastomal hernia. No evidence free intraperitoneal air.  IMPRESSION: Nonobstructive bowel gas pattern. Right-sided colostomy shows large associated soft tissue herniation, consistent with parastomal  hernia. Consider abdomen and pelvis CT for further evaluation if clinically warranted.   Electronically Signed   By: Earle Gell M.D.   On: 11/13/2014 19:15    Review of Systems  All other systems reviewed and are negative.   Blood pressure 127/70, pulse 102, temperature 98.1 F (36.7 C), temperature source Oral, resp. rate 16, SpO2 96 %. Physical Exam  GI:  Her abdomen is soft. There is extensive prolapse with edema of the distal segment of the loop colostomy. I can easily insert a finger into the proximal segment. I could not manually reduce the distal segment.  Her abdomen is otherwise soft and nontender     Assessment/Plan Colostomy prolapse  I have placed sugar on the ostomy to see if this will reduce the swelling and allow the distal segment to be composed reduced. Unfortunately, she may also have a pulmonary embolism and has been started on heparin. I will hold this at 4 AM in case surgery is necessary to reduce the ostomy in the morning. I discussed this with the patient and her family and they are in agreement.  Shawndrea Rutkowski A 11/13/2014, 11:45 PM

## 2014-11-13 NOTE — Progress Notes (Signed)
ANTICOAGULATION CONSULT NOTE - Initial Consult  Pharmacy Consult for Heparin Indication: pulmonary embolus  Allergies  Allergen Reactions  . Oxycontin [Oxycodone Hcl] Anaphylaxis, Hives and Itching  . Codeine Itching and Nausea And Vomiting    Patient Measurements:   Heparin Dosing Weight:   Vital Signs: Temp: 98.1 F (36.7 C) (05/11 1754) Temp Source: Oral (05/11 1754) BP: 127/70 mmHg (05/11 2158) Pulse Rate: 102 (05/11 2158)  Labs:  Recent Labs  11/13/14 1815  HGB 11.1*  HCT 34.6*  PLT 360  CREATININE 0.90    Estimated Creatinine Clearance: 58.3 mL/min (by C-G formula based on Cr of 0.9).   Medical History: Past Medical History  Diagnosis Date  . Anxiety   . Colon cancer     dx'd 2014    Medications:  Infusions:  . heparin      Assessment: Patient with colon cancer in ED with abdominal pain.  No oral anticoagulants noted on med rec. MD wishes for heparin per pharmacy for PE.  Baseline labs ordered.    Goal of Therapy:  Heparin level 0.3-0.7 units/ml Monitor platelets by anticoagulation protocol: Yes   Plan:  Heparin bolus 2000 units iv x1 Heparin drip at 1100 units/hr Daily CBC Next heparin level at 0800    [name] [name] 11/13/2014,10:43 PM

## 2014-11-13 NOTE — ED Notes (Signed)
Unable to obtain urine specimen do to pts pain level at this time

## 2014-11-13 NOTE — ED Notes (Signed)
Per pt, states abdominal pain since last weekend, was told to come to ED by cancer MD

## 2014-11-13 NOTE — ED Notes (Signed)
Whereas she had been too uncomfortable to compete x-rays earlier--she is again in our x-ray department, having been made comfortable with IV meds ordered by Dr. Darl Householder.

## 2014-11-14 ENCOUNTER — Observation Stay (HOSPITAL_COMMUNITY): Payer: Medicaid Other

## 2014-11-14 ENCOUNTER — Encounter (HOSPITAL_COMMUNITY): Payer: Self-pay | Admitting: Internal Medicine

## 2014-11-14 DIAGNOSIS — Z90710 Acquired absence of both cervix and uterus: Secondary | ICD-10-CM | POA: Diagnosis not present

## 2014-11-14 DIAGNOSIS — Z87891 Personal history of nicotine dependence: Secondary | ICD-10-CM | POA: Diagnosis not present

## 2014-11-14 DIAGNOSIS — R109 Unspecified abdominal pain: Secondary | ICD-10-CM | POA: Diagnosis present

## 2014-11-14 DIAGNOSIS — Z86711 Personal history of pulmonary embolism: Secondary | ICD-10-CM | POA: Diagnosis not present

## 2014-11-14 DIAGNOSIS — F419 Anxiety disorder, unspecified: Secondary | ICD-10-CM | POA: Diagnosis present

## 2014-11-14 DIAGNOSIS — Z808 Family history of malignant neoplasm of other organs or systems: Secondary | ICD-10-CM | POA: Diagnosis not present

## 2014-11-14 DIAGNOSIS — K9409 Other complications of colostomy: Secondary | ICD-10-CM | POA: Diagnosis not present

## 2014-11-14 DIAGNOSIS — D6859 Other primary thrombophilia: Secondary | ICD-10-CM | POA: Diagnosis present

## 2014-11-14 DIAGNOSIS — Z5111 Encounter for antineoplastic chemotherapy: Secondary | ICD-10-CM | POA: Diagnosis not present

## 2014-11-14 DIAGNOSIS — C787 Secondary malignant neoplasm of liver and intrahepatic bile duct: Secondary | ICD-10-CM | POA: Diagnosis present

## 2014-11-14 DIAGNOSIS — Z515 Encounter for palliative care: Secondary | ICD-10-CM | POA: Diagnosis not present

## 2014-11-14 DIAGNOSIS — Z803 Family history of malignant neoplasm of breast: Secondary | ICD-10-CM | POA: Diagnosis not present

## 2014-11-14 DIAGNOSIS — C19 Malignant neoplasm of rectosigmoid junction: Secondary | ICD-10-CM | POA: Diagnosis present

## 2014-11-14 DIAGNOSIS — I2699 Other pulmonary embolism without acute cor pulmonale: Secondary | ICD-10-CM | POA: Diagnosis present

## 2014-11-14 DIAGNOSIS — Z885 Allergy status to narcotic agent status: Secondary | ICD-10-CM | POA: Diagnosis not present

## 2014-11-14 DIAGNOSIS — K7689 Other specified diseases of liver: Secondary | ICD-10-CM | POA: Diagnosis present

## 2014-11-14 DIAGNOSIS — Z800 Family history of malignant neoplasm of digestive organs: Secondary | ICD-10-CM | POA: Diagnosis not present

## 2014-11-14 LAB — CBC
HCT: 29.5 % — ABNORMAL LOW (ref 36.0–46.0)
Hemoglobin: 9.2 g/dL — ABNORMAL LOW (ref 12.0–15.0)
MCH: 26.3 pg (ref 26.0–34.0)
MCHC: 31.2 g/dL (ref 30.0–36.0)
MCV: 84.3 fL (ref 78.0–100.0)
Platelets: 279 10*3/uL (ref 150–400)
RBC: 3.5 MIL/uL — ABNORMAL LOW (ref 3.87–5.11)
RDW: 13 % (ref 11.5–15.5)
WBC: 5.8 10*3/uL (ref 4.0–10.5)

## 2014-11-14 LAB — TROPONIN I: Troponin I: <0.03 ng/mL (ref <0.031)

## 2014-11-14 LAB — HEPARIN LEVEL (UNFRACTIONATED)
Heparin Unfractionated: 0.24 IU/mL — ABNORMAL LOW (ref 0.30–0.70)
Heparin Unfractionated: 0.18 IU/mL — ABNORMAL LOW (ref 0.30–0.70)

## 2014-11-14 MED ORDER — HYDROMORPHONE HCL 1 MG/ML IJ SOLN
1.0000 mg | INTRAMUSCULAR | Status: DC | PRN
  Administered 2014-11-14 – 2014-11-15 (×7): 1 mg via INTRAVENOUS
  Filled 2014-11-14 (×7): qty 1

## 2014-11-14 MED ORDER — HEPARIN BOLUS VIA INFUSION
2000.0000 [IU] | Freq: Once | INTRAVENOUS | Status: AC
  Administered 2014-11-14: 2000 [IU] via INTRAVENOUS
  Filled 2014-11-14: qty 2000

## 2014-11-14 MED ORDER — BOOST PLUS PO LIQD
237.0000 mL | Freq: Three times a day (TID) | ORAL | Status: DC
  Administered 2014-11-14 – 2014-11-16 (×4): 237 mL via ORAL
  Filled 2014-11-14 (×7): qty 237

## 2014-11-14 MED ORDER — ONDANSETRON HCL 4 MG/2ML IJ SOLN: 4.0000 mg | Freq: Four times a day (QID) | INTRAMUSCULAR | Status: DC | PRN

## 2014-11-14 MED ORDER — HEPARIN (PORCINE) IN NACL 100-0.45 UNIT/ML-% IJ SOLN
1250.0000 [IU]/h | INTRAMUSCULAR | Status: DC
  Administered 2014-11-14: 1250 [IU]/h via INTRAVENOUS
  Filled 2014-11-14: qty 250

## 2014-11-14 MED ORDER — SODIUM CHLORIDE 0.9 % IV SOLN: INTRAVENOUS | Status: DC

## 2014-11-14 MED ORDER — HEPARIN (PORCINE) IN NACL 100-0.45 UNIT/ML-% IJ SOLN
1500.0000 [IU]/h | INTRAMUSCULAR | Status: DC
  Administered 2014-11-15: 1500 [IU]/h via INTRAVENOUS
  Filled 2014-11-14: qty 250

## 2014-11-14 MED ORDER — HEPARIN (PORCINE) IN NACL 100-0.45 UNIT/ML-% IJ SOLN
1100.0000 [IU]/h | INTRAMUSCULAR | Status: DC
  Filled 2014-11-14: qty 250

## 2014-11-14 MED ORDER — POTASSIUM CHLORIDE IN NACL 20-0.9 MEQ/L-% IV SOLN
INTRAVENOUS | Status: DC
  Administered 2014-11-14 – 2014-11-16 (×7): via INTRAVENOUS
  Filled 2014-11-14 (×9): qty 1000

## 2014-11-14 NOTE — Progress Notes (Signed)
  Pt seen and examined at the bedside. Pt with history of colon cancer. Surgery is primary team. [facility] consulted for new diagnosis of PE. Currently not on [facility] due to anticipated surgery. Once surgery done then pt can be on Lovenox for anticoagulation.   Leisa Lenz [facility] [phone]

## 2014-11-14 NOTE — Progress Notes (Signed)
ANTICOAGULATION CONSULT NOTE - Follow Up Consult  Pharmacy Consult for Heparin Indication: pulmonary embolus  Allergies  Allergen Reactions  . Oxycontin [Oxycodone Hcl] Anaphylaxis, Hives and Itching  . Codeine Itching and Nausea And Vomiting    Patient Measurements: Height: 5\' 5"  (165.1 cm) Weight: 146 lb 6.2 oz (66.4 kg) IBW/kg (Calculated) : 57 Heparin Dosing Weight: 66.4 kg  Vital Signs: Temp: 98.1 F (36.7 C) (05/12 0639) Temp Source: Oral (05/12 0639) BP: 122/53 mmHg (05/12 0639) Pulse Rate: 80 (05/12 0639)  Labs:  Recent Labs  11/13/14 1815 11/13/14 2302 11/14/14 0322  HGB 11.1*  --  9.2*  HCT 34.6*  --  29.5*  PLT 360  --  279  APTT  --  24  --   LABPROT  --  13.9  --   INR  --  1.05  --   CREATININE 0.90  --   --   TROPONINI  --   --  <0.03    Estimated Creatinine Clearance: 58.3 mL/min (by C-G formula based on Cr of 0.9).   Medications:  Infusions:  . 0.9 % NaCl with KCl 20 mEq / L 125 mL/hr at 11/14/14 0120  . heparin      Assessment: 62 yoF presented to ED on 5/11 with severe abdominal pain.  PMH includes colon cancer, hysterectomy, colonic stents, and recent colostomy (10/28/14), now with noted hernia through colostomy.  CT shows peristomal hernia, no SBO, and possible PE.  CT angio confirms PE.  Pharmacy is consulted to dose IV Heparin.  Today, 11/14/2014:  Heparin level 0.24, subtherapeutic  CBC: Hgb 9.2, Plt 279  Order comments to HOLD heparin IV at 0400 on 5/12, but not stopped until 0900.  Goal of Therapy:  Heparin level 0.3-0.7 units/ml Monitor platelets by anticoagulation protocol: Yes   Plan:   Stop Heparin per order comments  Increase to heparin IV infusion at 1250 units/hr when resumed.  Heparin level 6 hours after starting  Daily heparin level and CBC  Continue to monitor H&H and platelets   Gretta Arab PharmD, BCPS Pager [phone] 11/14/2014 8:58 AM

## 2014-11-14 NOTE — Progress Notes (Addendum)
ANTICOAGULATION CONSULT NOTE - Follow Up Consult  Pharmacy Consult for Lovenox Indication: pulmonary embolus  Allergies  Allergen Reactions  . Oxycontin [Oxycodone Hcl] Anaphylaxis, Hives and Itching  . Codeine Itching and Nausea And Vomiting    Patient Measurements: Height: 5\' 5"  (165.1 cm) Weight: 146 lb 6.2 oz (66.4 kg) IBW/kg (Calculated) : 57 Heparin Dosing Weight: 66.4 kg  Vital Signs: Temp: 98.1 F (36.7 C) (05/12 0639) Temp Source: Oral (05/12 0639) BP: 122/53 mmHg (05/12 0639) Pulse Rate: 80 (05/12 0639)  Labs:  Recent Labs  11/13/14 1815 11/13/14 2302 11/14/14 0322 11/14/14 0808  HGB 11.1*  --  9.2*  --   HCT 34.6*  --  29.5*  --   PLT 360  --  279  --   APTT  --  24  --   --   LABPROT  --  13.9  --   --   INR  --  1.05  --   --   HEPARINUNFRC  --   --   --  0.24*  CREATININE 0.90  --   --   --   TROPONINI  --   --  <0.03 <0.03    Estimated Creatinine Clearance: 58.3 mL/min (by C-G formula based on Cr of 0.9).   Medications:  Infusions:  . 0.9 % NaCl with KCl 20 mEq / L 125 mL/hr at 11/14/14 0932  . heparin Stopped (11/14/14 0856)    Assessment: 19 yoF presented to ED on 5/11 with severe abdominal pain.  PMH includes colon cancer, hysterectomy, colonic stents, and recent colostomy (10/28/14), now with noted hernia through colostomy.  CT shows peristomal hernia, no SBO, and possible PE.  CT angio confirms PE.  No DVT on LE venous duplex.  Pharmacy consulted to dose IV Heparin.  Heparin was held 5/12 AM for possible surgery, but now no surgical plans, and heparin resumed 5/12.  Pharmacy now consulted to change Heparin IV to Lovenox.  Today, 11/15/2014:  Heparin level 0.41, therapeutic this AM  Heparin drip at 15 ml/hr  CBC: Hgb remains low/stable at 9.3, Plt remains WNL at 287  Renal: SCr 0.9, CrCl ~ 58 ml/min  Goal of Therapy:  Heparin level 0.3-0.7 units/ml Monitor platelets by anticoagulation protocol: Yes   Plan:   D/C Heparin  IV  Lovenox 1.5 mg/kg (100mg ) SQ q24h, first dose one hour after heparin drip d/c.  F/u renal function and CBC  Continue to monitor H&H and platelets    Addendum: Per [facility], Pt does not prefer Lovenox.  Pharmacy is consulted to change to Xarelto. First dose of Lovenox (24 hour dosing) given on 5/13 at 0941 Plan:  D/C further Lovenox doses, Start Xarelto on 5/14 AM  Xarelto 15 mg PO BID x21 days, followed by 20mg  daily with supper.  Pharmacist to provide patient education prior to discharge.   Gretta Arab PharmD, BCPS Pager [phone] 11/14/2014 9:46 AM

## 2014-11-14 NOTE — Consult Note (Signed)
PCP: [facility]  Oncology Fenton Malling  Requesting Physician Ninfa Linden   Reason for Consult: Pulmonary Embolism     HPI: [name] [name] is a 64 y.o. female   has a past medical history of Anxiety and Colon cancer.   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 Patient was undergoing palliative chemotherapy followed by Dr. Burr Medico. The stricture  was treated with endoscopic stent CT scan obtained June prior to admission on in the end of April showed sigmoid malignancy is grown and tubal lumen of the stent. On 25 of April patient has undergone palliative transverse loop colostomy by Dr. Zella Richer. On 28 of April she was able to be discharged home.  On 11 4 from a patient presented to emergency department due to abdominal pain 3 days ago she developed a herniation of her colostomy. This seems to be getting worse with putting pressure. The pain has been gradually getting worse and she is unable to tolerate anything by mouth. Patient did not endorse any fevers or chills. CT scan of the abdomen was done showing. PeriStomal hernia containing fat no evidence of obstruction. Incidental note is of possible pulmonary embolism. CT of the chest was done showing pulmonary embolism in the right lower lobe possible on base infiltrate consistent with infarct. ER has consulted general surgery Dr. Ninfa Linden has seen and admitted the patient. Plan is to take patient in the morning to OR to reduce the ostomy. Hospitalist  consulted for pulmonary embolism  Hospitalist was called for consultation  for PE  Review of Systems:    Pertinent positives include: abdominal pain, nausea, vomiting,  Constitutional:  No weight loss, night sweats, Fevers, chills, fatigue, weight loss  HEENT:  No headaches, Difficulty swallowing,Tooth/dental problems,Sore throat,  No sneezing, itching, ear ache, nasal congestion, post nasal drip,    Cardio-vascular:  No chest pain, Orthopnea, PND, anasarca, dizziness, palpitations.no Bilateral lower extremity swelling  GI:  No heartburn, indigestion,  diarrhea, change in bowel habits, loss of appetite, melena, blood in stool, hematemesis Resp:  no shortness of breath at rest. No dyspnea on exertion, No excess mucus, no productive cough, No non-productive cough, No coughing up of blood.No change in color of mucus.No wheezing. Skin:  no rash or lesions. No jaundice GU:  no dysuria, change in color of urine, no urgency or frequency. No straining to urinate.  No flank pain.  Musculoskeletal:  No joint pain or no joint swelling. No decreased range of motion. No back pain.  Psych:  No change in mood or affect. No depression or anxiety. No memory loss.  Neuro: no localizing neurological complaints, no tingling, no weakness, no double vision, no gait abnormality, no slurred speech, no confusion  Otherwise ROS are negative except for above, 10 systems were reviewed  Past Medical History: Past Medical History  Diagnosis Date  . Anxiety   . Colon cancer     dx'd 2014   Past Surgical History  Procedure Laterality Date  . Abdominal hysterectomy  2005  . Cesarean section  1976  . Flexible sigmoidoscopy N/A 05/25/2013    Procedure: FLEXIBLE SIGMOIDOSCOPY;  Surgeon: [name], MD;  Location: [facility];  Service: Endoscopy;  Laterality: N/A;  needs floro  . Colonic stent placement N/A 05/25/2013    Procedure: COLONIC STENT PLACEMENT;  Surgeon: [name], MD;  Location: [facility];  Service: Endoscopy;  Laterality: N/A;  . Portacath placement Right  06/25/2013    Procedure: ULTRA SOUND GUIDED INSERTION PORT-A-CATH;  Surgeon: [name], MD;  Location: [place];  Service: General;  Laterality: Right;  . Flexible sigmoidoscopy N/A 08/19/2014    Procedure: FLEXIBLE SIGMOIDOSCOPY;  Surgeon: Gatha Mayer, MD;  Location: [facility];  Service: Endoscopy;   Laterality: N/A;  . Colonic stent placement N/A 08/19/2014    Procedure: COLONIC STENT PLACEMENT;  Surgeon: Gatha Mayer, MD;  Location: [facility];  Service: Endoscopy;  Laterality: N/A;  . Flexible sigmoidoscopy N/A 08/19/2014    Procedure: FLEXIBLE SIGMOIDOSCOPY;  Surgeon: Gatha Mayer, MD;  Location: [facility];  Service: Endoscopy;  Laterality: N/A;  . Colonic stent placement N/A 08/19/2014    Procedure: COLONIC STENT PLACEMENT;  Surgeon: Gatha Mayer, MD;  Location: [facility];  Service: Endoscopy;  Laterality: N/A;  . Colon resection N/A 10/28/2014    Procedure: Transverse loop colostomy;  Surgeon: [name], MD;  Location: [facility];  Service: General;  Laterality: N/A;     Medications: Prior to Admission medications   Medication Sig Start Date End Date Taking? Authorizing Provider  Aspirin-Salicylamide-Caffeine (BC HEADACHE POWDER PO) Take 1 packet by mouth every 4 (four) hours as needed (headache).   Yes Historical Provider, MD  diphenhydrAMINE (BENADRYL) 25 MG tablet Take 50 mg by mouth every 6 (six) hours as needed for allergies.   Yes Historical Provider, MD  lidocaine-prilocaine (EMLA) cream Apply 1 application topically as needed. Apply to port site one hour before treatment and cover with plastic wrap. 07/02/13  Yes Concha Norway, MD  polyethylene glycol powder (GLYCOLAX/MIRALAX) powder TAKE 34 G (2 DOSES) BY MOUTH DAILY AS NEEDED FOR MILD CONSTIPATION OR MODERATE CONSTIPATION Patient taking differently: TAKE 1-2 DOSES BY MOUTH DAILY AS NEEDED FOR MILD CONSTIPATION OR MODERATE CONSTIPATION 03/03/14  Yes Concha Norway, MD  Wheat Dextrin (BENEFIBER PO) Take 1 tablet by mouth daily as needed (constipation).   Yes Historical Provider, MD  capecitabine (XELODA) 500 MG tablet Take 3 tablets (1,500 mg total) by mouth 2 (two) times daily after a meal. Patient not taking: Reported on 11/13/2014 09/13/14   Truitt Merle, MD  LORazepam (ATIVAN) 0.5 MG tablet Take 1 tablet (0.5 mg total) by  mouth every 8 (eight) hours as needed (nausea/vomiting). 04/22/14   Aasim Lona Kettle, MD  ondansetron (ZOFRAN) 8 MG tablet Take 8mg  by mouth every 12 hours as needed for nausea/vomiting.  Take 8 mg by mouth  30 minutes before taking chemo pills, and as needed for nausea/vomiting. 07/15/14   Truitt Merle, MD  oxyCODONE (OXY IR/ROXICODONE) 5 MG immediate release tablet Take 1-2 tablets (5-10 mg total) by mouth every 4 (four) hours as needed for moderate pain, severe pain or breakthrough pain. 10/31/14   Orson Eva, MD  prochlorperazine (COMPAZINE) 10 MG tablet TAKE 1 TABLET (10 MG TOTAL) BY MOUTH EVERY 6 (SIX) HOURS AS NEEDED (NAUSEA OR VOMITING). 01/01/14   Concha Norway, MD    Allergies:   Allergies  Allergen Reactions  . Oxycontin [Oxycodone Hcl] Anaphylaxis, Hives and Itching  . Codeine Itching and Nausea And Vomiting    Social History:  Ambulatory   independently   Lives at home   With family     reports that she quit smoking about 43 years ago. Her smoking use included Cigarettes. She quit after 1 year of use. She has never used smokeless tobacco. She reports that she does not drink alcohol or use illicit drugs.    Family History: family history  includes Bone cancer in her maternal grandfather; Breast cancer in her maternal aunt; Colon cancer (age of onset: 76) in her sister; Prostate cancer in her maternal uncle and maternal uncle. There is no history of Diabetes or Thyroid disease.    Physical Exam: Patient Vitals for the past 24 hrs:  BP Temp Temp src Pulse Resp SpO2 Height Weight  11/14/14 0028 111/70 mmHg 98.2 F (36.8 C) Oral (!) 102 16 97 % 5\' 5"  (1.651 m) 66.4 kg (146 lb 6.2 oz)  11/13/14 2327 129/74 mmHg - - 113 16 94 % - -  11/13/14 2158 127/70 mmHg - - 102 16 96 % - -  11/13/14 2011 144/65 mmHg - - 98 19 98 % - -  11/13/14 1910 153/75 mmHg - - 95 14 100 % - -  11/13/14 1908 153/75 mmHg - - 96 18 100 % - -  11/13/14 1754 122/74 mmHg 98.1 F (36.7 C) Oral (!) 124 18 100 % - -     1. General:  in No Acute distress 2. Psychological: Alert and   Oriented 3. Head/ENT:   Moist   Mucous Membranes                          Head Non traumatic, neck supple                          Normal  Dentition 4. SKIN:  decreased Skin turgor,  Skin clean Dry and intact no rash 5. Heart: Regular rate and rhythm no Murmur, Rub or gallop 6. Lungs: Clear to auscultation bilaterally, no wheezes or crackles   7. Abdomen: Soft, mildly tender, Non distended, colostomy bag present with intra-abdominal contents inside 8. Lower extremities: no clubbing, cyanosis, or edema 9. Neurologically Grossly intact, moving all 4 extremities equally 10. MSK: Normal range of motion  body mass index is 24.36 kg/(m^2).   Labs on Admission:   Results for orders placed or performed during the hospital encounter of 11/13/14 (from the past 24 hour(s))  CBC with Differential     Status: Abnormal   Collection Time: 11/13/14  6:15 PM  Result Value Ref Range   WBC 7.6 4.0 - 10.5 K/uL   RBC 4.16 3.87 - 5.11 MIL/uL   Hemoglobin 11.1 (L) 12.0 - 15.0 g/dL   HCT 34.6 (L) 36.0 - 46.0 %   MCV 83.2 78.0 - 100.0 fL   MCH 26.7 26.0 - 34.0 pg   MCHC 32.1 30.0 - 36.0 g/dL   RDW 12.9 11.5 - 15.5 %   Platelets 360 150 - 400 K/uL   Neutrophils Relative % 68 43 - 77 %   Neutro Abs 5.2 1.7 - 7.7 K/uL   Lymphocytes Relative 24 12 - 46 %   Lymphs Abs 1.8 0.7 - 4.0 K/uL   Monocytes Relative 5 3 - 12 %   Monocytes Absolute 0.4 0.1 - 1.0 K/uL   Eosinophils Relative 3 0 - 5 %   Eosinophils Absolute 0.2 0.0 - 0.7 K/uL   Basophils Relative 0 0 - 1 %   Basophils Absolute 0.0 0.0 - 0.1 K/uL  Comprehensive metabolic panel     Status: Abnormal   Collection Time: 11/13/14  6:15 PM  Result Value Ref Range   Sodium 139 135 - 145 mmol/L   Potassium 3.1 (L) 3.5 - 5.1 mmol/L   Chloride 109 101 - 111 mmol/L   CO2 21 (L)  22 - 32 mmol/L   Glucose, Bld 97 70 - 99 mg/dL   BUN 13 6 - 20 mg/dL   Creatinine, Ser 0.90 0.44 - 1.00  mg/dL   Calcium 10.6 (H) 8.9 - 10.3 mg/dL   Total Protein 8.0 6.5 - 8.1 g/dL   Albumin 3.5 3.5 - 5.0 g/dL   AST 16 15 - 41 U/L   ALT 9 (L) 14 - 54 U/L   Alkaline Phosphatase 61 38 - 126 U/L   Total Bilirubin 0.6 0.3 - 1.2 mg/dL   GFR calc non Af Amer >60 >60 mL/min   GFR calc Af Amer >60 >60 mL/min   Anion gap 9 5 - 15  Urinalysis, Routine w reflex microscopic     Status: Abnormal   Collection Time: 11/13/14  8:08 PM  Result Value Ref Range   Color, Urine YELLOW YELLOW   APPearance CLEAR CLEAR   Specific Gravity, Urine 1.016 1.005 - 1.030   pH 7.0 5.0 - 8.0   Glucose, UA NEGATIVE NEGATIVE mg/dL   Hgb urine dipstick NEGATIVE NEGATIVE   Bilirubin Urine NEGATIVE NEGATIVE   Ketones, ur 15 (A) NEGATIVE mg/dL   Protein, ur NEGATIVE NEGATIVE mg/dL   Urobilinogen, UA 0.2 0.0 - 1.0 mg/dL   Nitrite NEGATIVE NEGATIVE   Leukocytes, UA NEGATIVE NEGATIVE  APTT     Status: None   Collection Time: 11/13/14 11:02 PM  Result Value Ref Range   aPTT 24 24 - 37 seconds  Protime-INR     Status: None   Collection Time: 11/13/14 11:02 PM  Result Value Ref Range   Prothrombin Time 13.9 11.6 - 15.2 seconds   INR 1.05 0.00 - 1.49    UA no evidence of UTI  No results found for: HGBA1C  Estimated Creatinine Clearance: 58.3 mL/min (by C-G formula based on Cr of 0.9).  BNP (last 3 results) No results for input(s): PROBNP in the last 8760 hours.  Other results:  I have pearsonaly reviewed this: ECG REPORT  Rate:97  Rhythm: normal sinus rhythm ST&T Change: T wave inversions QTC 427  Filed Weights   11/14/14 0028  Weight: 66.4 kg (146 lb 6.2 oz)     Cultures:    Component Value Date/Time   SDES ABSCESS HAND RIGHT 03/30/2012 0951   SPECREQUEST NONE 03/30/2012 0951   CULT  03/30/2012 0951    FEW STAPHYLOCOCCUS AUREUS Note: RIFAMPIN AND GENTAMICIN SHOULD NOT BE USED AS SINGLE DRUGS FOR TREATMENT OF STAPH INFECTIONS. This organism DOES NOT demonstrate inducible Clindamycin resistance in  vitro.   REPTSTATUS 04/02/2012 FINAL 03/30/2012 0951     Radiological Exams on Admission: Ct Angio Chest Pe W/cm &/or Wo Cm  11/13/2014   CLINICAL DATA:  Shortness of breath. Possible pulmonary embolus identified on CT abdomen and pelvis. History of colon cancer.  EXAM: CT ANGIOGRAPHY CHEST WITH CONTRAST  TECHNIQUE: Multidetector CT imaging of the chest was performed using the standard protocol during bolus administration of intravenous contrast. Multiplanar CT image reconstructions and MIPs were obtained to evaluate the vascular anatomy.  CONTRAST:  172mL OMNIPAQUE IOHEXOL 350 MG/ML SOLN  COMPARISON:  CT chest abdomen and pelvis 07/01/2014. CT abdomen and pelvis 11/13/2014.  FINDINGS: Technically adequate study with good opacification of the central and segmental pulmonary arteries. Filling defects are demonstrated in the right lower lobe and segmental branches to the right lower lobe pulmonary arteries consistent with acute pulmonary embolus. Wedge-shaped peripheral infiltration in the right lung base probably represents focal infarct. Atelectasis also demonstrated  in the lung bases.  Normal heart size. Normal caliber thoracic aorta. No aortic dissection. Great vessel origins are patent. No significant lymphadenopathy in the chest. Esophagus is decompressed. No focal pulmonary nodules are demonstrated to suggest metastatic disease. No pleural effusions. No pneumothorax. Airways are patent. Degenerative changes in the spine. No destructive bone lesions appreciated.  Review of the MIP images confirms the above findings.  IMPRESSION: Positive study for pulmonary emboli in the right lower lobe pulmonary arteries. Small wedge-shaped infiltration in the right lung base is likely an infarct.  These results were called by telephone at the time of interpretation on 11/13/2014 at 11:56 pm to Dr. Colin Rhein, who verbally acknowledged these results.   Electronically Signed   By: Lucienne Capers M.D.   On: 11/13/2014 23:58    Ct Abdomen Pelvis W Contrast  11/13/2014   CLINICAL DATA:  Colon cancer. Abdominal pain for 1 week. Nausea and vomiting. Unable to drink oral contrast material.  EXAM: CT ABDOMEN AND PELVIS WITH CONTRAST  TECHNIQUE: Multidetector CT imaging of the abdomen and pelvis was performed using the standard protocol following bolus administration of intravenous contrast.  CONTRAST:  198mL OMNIPAQUE IOHEXOL 300 MG/ML  SOLN  COMPARISON:  10/24/2014  FINDINGS: There is suggestion of a focal filling defect in a visualized right lower lobe pulmonary artery. There is also a small wedge-shaped peripheral infiltrate in the right lung base. Findings may indicate pulmonary embolus. If clinically suspicious, CT pulmonary embolus study of the chest is suggested.  The liver demonstrates multiple focal lesions. In segment 4 of the liver, there is a calcified lesion measuring about 1 cm diameter corresponding to unknown treated metastasis. This is not change since prior study. In segment 7 of the liver towards the dome, there is a 16 mm lesion which has enhancement features consistent with cavernous hemangioma. In segment 5-6 of the liver, there is a 8.1 cm diameter lesion which has enhancement characteristics most consistent with cavernous hemangioma. Tiny sub cm lesions are also present likely representing cysts. Lesions are similar to prior study. Heterogeneous mass lesion in the anterior spleen measuring 5.5 cm diameter consistent with metastasis. No significant change in appearance. Small accessory spleen. The gallbladder, pancreas, adrenal glands, abdominal aorta, and inferior vena cava are unremarkable. Scattered perirectal lymph nodes are upper limits of normal size and may be metastatic. 8 mm stone in the lower pole left kidney. No hydronephrosis or hydroureter in either kidney. Punctate stone also in the upper pole left kidney. No change since prior study. Stomach and small bowel are not abnormally distended. There is a a  right lower quadrant ileostomy, new since prior study. There is a large peristomal hernia containing fat. The colon is diffusely stool-filled and has decompressed since previous study. No free air or free fluid in the abdomen. Postoperative changes along the midline.  Pelvis: Sigmoid colon mass consistent with known cancer. Wall stent is in place. Tumor appears to have invaded the stent and infiltrated into the pericolonic fat. Appearance is similar to prior study. Proximal colonic dilatation has decreased, likely due to the diverting colostomy. Small amount of free fluid in the pelvis is likely reactive. Uterus appears surgically absent. Appendix is normal. Bladder wall is not thickened. Degenerative changes in the spine. No destructive bone lesions appreciated.  IMPRESSION: Sigmoid colon carcinoma with wall stent in place. Tumor invades the stent and into the surrounding pericolonic fat. Proximal stool-filled colon is more decompressed than on prior study with interval placement of a diverting right  ileostomy. Peristomal hernia containing fat. No proximal gastric or small bowel dilatation to suggest obstruction. Multiple liver lesions including a stable treated metastasis and multiple cavernous hemangiomas. 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 Nonobstructing stones in the left kidney. Changes in the right lung base as described are worrisome for pulmonary embolus. If clinically indicated, CT PE study of the chest is suggested.  These results were called by telephone at the time of interpretation on 11/13/2014 at 9:57 pm to Dr. Shirlyn Goltz , who verbally acknowledged these results.   Electronically Signed   By: Lucienne Capers M.D.   On: 11/13/2014 22:02   Dg Abd Acute W/chest  11/13/2014   CLINICAL DATA:  Severe abdominal pain and vomiting. Previous history of colostomy for colon carcinoma.  EXAM: DG ABDOMEN ACUTE W/ 1V CHEST  COMPARISON:  Radiographs and CT on 10/24/2014  FINDINGS: No evidence  of dilated bowel loops. An intraluminal stent is again seen within the distal sigmoid colon. A new right abdominal colostomy is seen with large associated soft tissue herniation, consistent with parastomal hernia. No evidence free intraperitoneal air.  IMPRESSION: Nonobstructive bowel gas pattern. Right-sided colostomy shows large associated soft tissue herniation, consistent with parastomal hernia. Consider abdomen and pelvis CT for further evaluation if clinically warranted.   Electronically Signed   By: Earle Gell M.D.   On: 11/13/2014 19:15    Chart has been reviewed  Family   at  Bedside  plan of care was discussed with   Daughter,  Assessment/Plan  59 yo F history of stage IV colon cancer with metastases to liver and spleen with colonic stricture treated previously by stent placement that now has failed presents status post palliative loop colostomy with. peristomal hernia of mesenteric fat. Patient incidentally was found to have PE. Medicine was consulted for PE management  Present on Admission:  . Colostomy prolapse - As per Gen. Surgery plan on intraoperative reduction tomorrow if manual reduction fails after sugar application . Colorectal cancer, stage IV - or need to notify oncologist tomorrow the patient has been admitted . Pulmonary embolism - for now continue heparin holding off on long-term anticoagulation until surgical issues has been resolved will order an echogram cycle cardiac markers monitor on telemetry. Patient malignancy likely cause of hypercoagulable state.   Prophylaxis: Heparin IV  CODE STATUS:  FULL CODE  as per  family  Disposition:  To home once workup is complete and patient is stable  Other plan as per orders.  I have spent a total of 65 min on this consult extra time was taken to discuss case with surgery  Ramin Zoll 11/14/2014, 1:05 AM  [facility]  Pager [phone]   after 2 AM please page floor coverage PA If 7AM-7PM, please contact  the day team taking care of the patient  [email or web address]  Password [email or web address]

## 2014-11-14 NOTE — Progress Notes (Signed)
ANTICOAGULATION CONSULT NOTE - Follow Up Consult  Pharmacy Consult for Heparin Indication: pulmonary embolus  Assessment: See pharmacist note from earlier today for further detail.  Patient on heparin infusion for PE.  Infusion held this AM and patient received heparin bolus and the infusion was resumed at 1250 units/hr this afternoon.  Heparin level subtherapeutic at 0.18 units/ml. RN reports the colostomy output continues to be a light red. MD aware. Instructed RN to alert MD if worsens.  Goal of Therapy:  Heparin level 0.3-0.7 units/ml Monitor platelets by anticoagulation protocol: Yes   Plan:  Heparin 2000 units bolus IV x 1  Increase heparin IV infusion to 1500 units/hr  Heparin level 6 hours after rate increase  [name] [name], PharmD, BCPS Pager: [phone] 11/14/2014 9:13 PM

## 2014-11-14 NOTE — Progress Notes (Signed)
ANTICOAGULATION CONSULT NOTE - Follow Up Consult  Pharmacy Consult for Heparin Indication: pulmonary embolus  Allergies  Allergen Reactions  . Oxycontin [Oxycodone Hcl] Anaphylaxis, Hives and Itching  . Codeine Itching and Nausea And Vomiting    Patient Measurements: Height: 5\' 5"  (165.1 cm) Weight: 146 lb 6.2 oz (66.4 kg) IBW/kg (Calculated) : 57 Heparin Dosing Weight: 66.4 kg  Vital Signs: Temp: 98.1 F (36.7 C) (05/12 0639) Temp Source: Oral (05/12 0639) BP: 122/53 mmHg (05/12 0639) Pulse Rate: 80 (05/12 0639)  Labs:  Recent Labs  11/13/14 1815 11/13/14 2302 11/14/14 0322 11/14/14 0808  HGB 11.1*  --  9.2*  --   HCT 34.6*  --  29.5*  --   PLT 360  --  279  --   APTT  --  24  --   --   LABPROT  --  13.9  --   --   INR  --  1.05  --   --   HEPARINUNFRC  --   --   --  0.24*  CREATININE 0.90  --   --   --   TROPONINI  --   --  <0.03 <0.03    Estimated Creatinine Clearance: 58.3 mL/min (by C-G formula based on Cr of 0.9).   Medications:  Infusions:  . 0.9 % NaCl with KCl 20 mEq / L 125 mL/hr at 11/14/14 0932  . heparin Stopped (11/14/14 0856)    Assessment: 4 yoF presented to ED on 5/11 with severe abdominal pain.  PMH includes colon cancer, hysterectomy, colonic stents, and recent colostomy (10/28/14), now with noted hernia through colostomy.  CT shows peristomal hernia, no SBO, and possible PE.  CT angio confirms PE.  Pharmacy is consulted to dose IV Heparin.  Heparin held this AM for surgical evaluation, but no surgery planned at this time.  OK to resume heparin per CCS.  Today, 11/14/2014:  Heparin level 0.24, subtherapeutic  CBC: Hgb 9.2, Plt 279  Heparin held at 0900.  Goal of Therapy:  Heparin level 0.3-0.7 units/ml Monitor platelets by anticoagulation protocol: Yes   Plan:  Give heparin 2000 units bolus IV x 1  Increase to heparin IV infusion at 1250 units/hr when resumed.  Heparin level 6 hours after starting  Daily heparin level and  CBC  Continue to monitor H&H and platelets   Gretta Arab PharmD, BCPS Pager [phone] 11/14/2014 1:54 PM

## 2014-11-14 NOTE — Progress Notes (Signed)
Patient ID: [name] [name], female   DOB: 10/17/1950, 64 y.o.   MRN: [id number]     [address] [place], [place] [address]    Phone: [phone] FAX: [phone]     Subjective: VSS.  Afebrile.  Ostomy functioning.    Objective:  Vital signs:  Filed Vitals:   11/13/14 2327 11/14/14 0028 11/14/14 0230 11/14/14 0639  BP: 129/74 111/70 121/66 122/53  Pulse: 113 102 93 80  Temp:  98.2 F (36.8 C) 98 F (36.7 C) 98.1 F (36.7 C)  TempSrc:  Oral Oral Oral  Resp: '16 16 18 18  ' Height:  '5\' 5"'  (1.651 m)    Weight:  66.4 kg (146 lb 6.2 oz)    SpO2: 94% 97% 98% 100%    Last BM Date: 11/14/14  Intake/Output   Yesterday:  05/11 0701 - 05/12 0700 In: 1673.6 [I.V.:673.6; IV Piggyback:1000] Out: -  This shift:  Total I/O In: 20 [I.V.:20] Out: 350 [Stool:350]   Physical Exam: General: Pt awake/alert/oriented x4 in no acute distress Chest: cta bilaterally.  No chest wall pain w good excursion CV:  Pulses intact.  Regular rhythm MS: Normal AROM mjr joints.  No obvious deformity Abdomen: Soft.  Nondistended.  Non tender.  Prolapsed colostomy, pink and viable, stool output.   No evidence of peritonitis.  No incarcerated hernias. Ext:  SCDs BLE.  No mjr edema.  No cyanosis Skin: No petechiae / purpura   Problem List:   Active Problems:   Colorectal cancer, stage IV   Colostomy prolapse   Pulmonary embolism    Results:   Labs: Results for orders placed or performed during the hospital encounter of 11/13/14 (from the past 48 hour(s))  CBC with Differential     Status: Abnormal   Collection Time: 11/13/14  6:15 PM  Result Value Ref Range   WBC 7.6 4.0 - 10.5 K/uL   RBC 4.16 3.87 - 5.11 MIL/uL   Hemoglobin 11.1 (L) 12.0 - 15.0 g/dL   HCT 34.6 (L) 36.0 - 46.0 %   MCV 83.2 78.0 - 100.0 fL   MCH 26.7 26.0 - 34.0 pg   MCHC 32.1 30.0 - 36.0 g/dL   RDW 12.9 11.5 - 15.5 %   Platelets 360 150 - 400 K/uL   Neutrophils Relative % 68 43 - 77 %   Neutro Abs 5.2 1.7 - 7.7 K/uL   Lymphocytes Relative 24 12 - 46 %   Lymphs Abs 1.8 0.7 - 4.0 K/uL   Monocytes Relative 5 3 - 12 %   Monocytes Absolute 0.4 0.1 - 1.0 K/uL   Eosinophils Relative 3 0 - 5 %   Eosinophils Absolute 0.2 0.0 - 0.7 K/uL   Basophils Relative 0 0 - 1 %   Basophils Absolute 0.0 0.0 - 0.1 K/uL  Comprehensive metabolic panel     Status: Abnormal   Collection Time: 11/13/14  6:15 PM  Result Value Ref Range   Sodium 139 135 - 145 mmol/L   Potassium 3.1 (L) 3.5 - 5.1 mmol/L   Chloride 109 101 - 111 mmol/L   CO2 21 (L) 22 - 32 mmol/L   Glucose, Bld 97 70 - 99 mg/dL   BUN 13 6 - 20 mg/dL   Creatinine, Ser 0.90 0.44 - 1.00 mg/dL   Calcium 10.6 (H) 8.9 - 10.3 mg/dL   Total Protein 8.0 6.5 - 8.1 g/dL   Albumin 3.5 3.5 -  5.0 g/dL   AST 16 15 - 41 U/L   ALT 9 (L) 14 - 54 U/L   Alkaline Phosphatase 61 38 - 126 U/L   Total Bilirubin 0.6 0.3 - 1.2 mg/dL   GFR calc non Af Amer >60 >60 mL/min   GFR calc Af Amer >60 >60 mL/min    Comment: (NOTE) The eGFR has been calculated using the CKD EPI equation. This calculation has not been validated in all clinical situations. eGFR's persistently <60 mL/min signify possible Chronic Kidney Disease.    Anion gap 9 5 - 15  Urinalysis, Routine w reflex microscopic     Status: Abnormal   Collection Time: 11/13/14  8:08 PM  Result Value Ref Range   Color, Urine YELLOW YELLOW   APPearance CLEAR CLEAR   Specific Gravity, Urine 1.016 1.005 - 1.030   pH 7.0 5.0 - 8.0   Glucose, UA NEGATIVE NEGATIVE mg/dL   Hgb urine dipstick NEGATIVE NEGATIVE   Bilirubin Urine NEGATIVE NEGATIVE   Ketones, ur 15 (A) NEGATIVE mg/dL   Protein, ur NEGATIVE NEGATIVE mg/dL   Urobilinogen, UA 0.2 0.0 - 1.0 mg/dL   Nitrite NEGATIVE NEGATIVE   Leukocytes, UA NEGATIVE NEGATIVE    Comment: MICROSCOPIC NOT DONE ON URINES WITH NEGATIVE PROTEIN, BLOOD, LEUKOCYTES, NITRITE, OR GLUCOSE <1000 mg/dL.  APTT     Status: None    Collection Time: 11/13/14 11:02 PM  Result Value Ref Range   aPTT 24 24 - 37 seconds  Protime-INR     Status: None   Collection Time: 11/13/14 11:02 PM  Result Value Ref Range   Prothrombin Time 13.9 11.6 - 15.2 seconds   INR 1.05 0.00 - 1.49  CBC     Status: Abnormal   Collection Time: 11/14/14  3:22 AM  Result Value Ref Range   WBC 5.8 4.0 - 10.5 K/uL   RBC 3.50 (L) 3.87 - 5.11 MIL/uL   Hemoglobin 9.2 (L) 12.0 - 15.0 g/dL   HCT 29.5 (L) 36.0 - 46.0 %   MCV 84.3 78.0 - 100.0 fL   MCH 26.3 26.0 - 34.0 pg   MCHC 31.2 30.0 - 36.0 g/dL   RDW 13.0 11.5 - 15.5 %   Platelets 279 150 - 400 K/uL  Troponin I (q 6hr x 3)     Status: None   Collection Time: 11/14/14  3:22 AM  Result Value Ref Range   Troponin I <0.03 <0.031 ng/mL    Comment:        NO INDICATION OF MYOCARDIAL INJURY.   Heparin level (unfractionated)     Status: Abnormal   Collection Time: 11/14/14  8:08 AM  Result Value Ref Range   Heparin Unfractionated 0.24 (L) 0.30 - 0.70 IU/mL    Comment:        IF HEPARIN RESULTS ARE BELOW EXPECTED VALUES, AND PATIENT DOSAGE HAS BEEN CONFIRMED, SUGGEST FOLLOW UP TESTING OF ANTITHROMBIN III LEVELS.   Troponin I (q 6hr x 3)     Status: None   Collection Time: 11/14/14  8:08 AM  Result Value Ref Range   Troponin I <0.03 <0.031 ng/mL    Comment:        NO INDICATION OF MYOCARDIAL INJURY.     Imaging / Studies: Ct Angio Chest Pe W/cm &/or Wo Cm  11/13/2014   CLINICAL DATA:  Shortness of breath. Possible pulmonary embolus identified on CT abdomen and pelvis. History of colon cancer.  EXAM: CT ANGIOGRAPHY CHEST WITH CONTRAST  TECHNIQUE: Multidetector CT  imaging of the chest was performed using the standard protocol during bolus administration of intravenous contrast. Multiplanar CT image reconstructions and MIPs were obtained to evaluate the vascular anatomy.  CONTRAST:  155m OMNIPAQUE IOHEXOL 350 MG/ML SOLN  COMPARISON:  CT chest abdomen and pelvis 07/01/2014. CT abdomen and  pelvis 11/13/2014.  FINDINGS: Technically adequate study with good opacification of the central and segmental pulmonary arteries. Filling defects are demonstrated in the right lower lobe and segmental branches to the right lower lobe pulmonary arteries consistent with acute pulmonary embolus. Wedge-shaped peripheral infiltration in the right lung base probably represents focal infarct. Atelectasis also demonstrated in the lung bases.  Normal heart size. Normal caliber thoracic aorta. No aortic dissection. Great vessel origins are patent. No significant lymphadenopathy in the chest. Esophagus is decompressed. No focal pulmonary nodules are demonstrated to suggest metastatic disease. No pleural effusions. No pneumothorax. Airways are patent. Degenerative changes in the spine. No destructive bone lesions appreciated.  Review of the MIP images confirms the above findings.  IMPRESSION: Positive study for pulmonary emboli in the right lower lobe pulmonary arteries. Small wedge-shaped infiltration in the right lung base is likely an infarct.  These results were called by telephone at the time of interpretation on 11/13/2014 at 11:56 pm to Dr. GColin Rhein who verbally acknowledged these results.   Electronically Signed   By: WLucienne CapersM.D.   On: 11/13/2014 23:58   Ct Abdomen Pelvis W Contrast  11/13/2014   CLINICAL DATA:  Colon cancer. Abdominal pain for 1 week. Nausea and vomiting. Unable to drink oral contrast material.  EXAM: CT ABDOMEN AND PELVIS WITH CONTRAST  TECHNIQUE: Multidetector CT imaging of the abdomen and pelvis was performed using the standard protocol following bolus administration of intravenous contrast.  CONTRAST:  1049mOMNIPAQUE IOHEXOL 300 MG/ML  SOLN  COMPARISON:  10/24/2014  FINDINGS: There is suggestion of a focal filling defect in a visualized right lower lobe pulmonary artery. There is also a small wedge-shaped peripheral infiltrate in the right lung base. Findings may indicate pulmonary  embolus. If clinically suspicious, CT pulmonary embolus study of the chest is suggested.  The liver demonstrates multiple focal lesions. In segment 4 of the liver, there is a calcified lesion measuring about 1 cm diameter corresponding to unknown treated metastasis. This is not change since prior study. In segment 7 of the liver towards the dome, there is a 16 mm lesion which has enhancement features consistent with cavernous hemangioma. In segment 5-6 of the liver, there is a 8.1 cm diameter lesion which has enhancement characteristics most consistent with cavernous hemangioma. Tiny sub cm lesions are also present likely representing cysts. Lesions are similar to prior study. Heterogeneous mass lesion in the anterior spleen measuring 5.5 cm diameter consistent with metastasis. No significant change in appearance. Small accessory spleen. The gallbladder, pancreas, adrenal glands, abdominal aorta, and inferior vena cava are unremarkable. Scattered perirectal lymph nodes are upper limits of normal size and may be metastatic. 8 mm stone in the lower pole left kidney. No hydronephrosis or hydroureter in either kidney. Punctate stone also in the upper pole left kidney. No change since prior study. Stomach and small bowel are not abnormally distended. There is a a right lower quadrant ileostomy, new since prior study. There is a large peristomal hernia containing fat. The colon is diffusely stool-filled and has decompressed since previous study. No free air or free fluid in the abdomen. Postoperative changes along the midline.  Pelvis: Sigmoid colon mass consistent with known  cancer. Wall stent is in place. Tumor appears to have invaded the stent and infiltrated into the pericolonic fat. Appearance is similar to prior study. Proximal colonic dilatation has decreased, likely due to the diverting colostomy. Small amount of free fluid in the pelvis is likely reactive. Uterus appears surgically absent. Appendix is normal.  Bladder wall is not thickened. Degenerative changes in the spine. No destructive bone lesions appreciated.  IMPRESSION: Sigmoid colon carcinoma with wall stent in place. Tumor invades the stent and into the surrounding pericolonic fat. Proximal stool-filled colon is more decompressed than on prior study with interval placement of a diverting right ileostomy. Peristomal hernia containing fat. No proximal gastric or small bowel dilatation to suggest obstruction. Multiple liver lesions including a stable treated metastasis and multiple cavernous hemangiomas. 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 Nonobstructing stones in the left kidney. Changes in the right lung base as described are worrisome for pulmonary embolus. If clinically indicated, CT PE study of the chest is suggested.  These results were called by telephone at the time of interpretation on 11/13/2014 at 9:57 pm to Dr. Shirlyn Goltz , who verbally acknowledged these results.   Electronically Signed   By: Lucienne Capers M.D.   On: 11/13/2014 22:02   Dg Abd Acute W/chest  11/13/2014   CLINICAL DATA:  Severe abdominal pain and vomiting. Previous history of colostomy for colon carcinoma.  EXAM: DG ABDOMEN ACUTE W/ 1V CHEST  COMPARISON:  Radiographs and CT on 10/24/2014  FINDINGS: No evidence of dilated bowel loops. An intraluminal stent is again seen within the distal sigmoid colon. A new right abdominal colostomy is seen with large associated soft tissue herniation, consistent with parastomal hernia. No evidence free intraperitoneal air.  IMPRESSION: Nonobstructive bowel gas pattern. Right-sided colostomy shows large associated soft tissue herniation, consistent with parastomal hernia. Consider abdomen and pelvis CT for further evaluation if clinically warranted.   Electronically Signed   By: Earle Gell M.D.   On: 11/13/2014 19:15    Medications / Allergies:  Scheduled Meds: . lactose free nutrition  237 mL Oral TID WC   Continuous  Infusions: . 0.9 % NaCl with KCl 20 mEq / L 125 mL/hr at 11/14/14 0932  . heparin Stopped (11/14/14 0856)   PRN Meds:.HYDROmorphone (DILAUDID) injection, ondansetron, sodium chloride  Antibiotics: Anti-infectives    None       Assessment/Plan Stage IV obstructing colon cancer growing into stent at rectosigmoid junction extending inferiorly into proximal rectum (lowest extent is 10 cm from anal verge) S/p transverse loop colostomy ---Dr. Zella Richer  Prolapsed colostomy Unfortunately, there is nothing surgically we can offer her.  She was just diagnosed with PEs.  The ostomy is functioning.  Will start her on clears and advance diet as tolerated.  Consult to palliative medicine to discuss goals of care.   Pulmonary emboli-on heparin, will keep for now until palliative care meeting with the patient and familiy. FEN-add PO dilaudid   Erby Pian, ANP-BC [facility] Pager [phone])   11/14/2014 1:01 PM

## 2014-11-14 NOTE — Progress Notes (Signed)
Report received from Mortimer Fries, RN. No change from initial assessment. Will continue to monitor and follow the POC.

## 2014-11-14 NOTE — Consult Note (Addendum)
Consultation Note Date: 11/14/2014   Patient Name: [name] [name]  DOB: [birth year]  MRN: [id number]  Age / Sex: 64 y.o., female   PCP: [facility] Referring Physician: Md Edison Pace, MD  Reason for Consultation: Establishing goals of care  Palliative Care Assessment and Plan Summary of Established Goals of Care and Medical Treatment Preferences    Palliative Care Discussion Held Today Contacts/Participants in Discussion: Primary Decision Maker: Patient     Code Status/Advance Care Planning:  Full code - patient has given this consideration and remains a full code, including short term mechanical ventilation if needed.  Symptom Management:   Post-op cramping pain from 10/24/14 - was controlled by oxycontin but she had full blown allergic reaction. Moving forward NO CODEINE, HYDROCODONE OR OXYCODONE products. Recommend APAP 500 mg tid for pain. If not adequate consider dilaudid or morphine. Not needed at this time.   Palliative Prophylaxis: not an issue  Psycho-social/Spiritual:   Support System: strong family and church support  Desire for further Chaplaincy support:no  Prognosis: Unable to determine has responded to therapy and Dr. Burr Medico is planning additional treatment. Patient believes she is doing well and responding to treatment  Discharge Planning:  home and continued follow up with Dr. Burr Medico.       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 Patient was undergoing palliative chemotherapy followed by Dr. Burr Medico. The stricture was treated with endoscopic stent. CT scan obtained June prior to admission at the end of April showed sigmoid malignancy is grown into tubal lumen of the stent. On 25 of April patient underwent palliative transverse loop colostomy by Dr. Zella Richer. On 28 of April she was able to be discharged home.  On 5/11 patient  presented to emergency department due to abdominal pain. 3 days ago she developed a herniation of her colostomy. This seems to be getting worse. The pain has been gradually getting worse and she is unable to tolerate anything by mouth. Patient did not endorse any fevers or chills. CT scan of the abdomen was done showing periStomal hernia containing fat, no evidence of obstruction. AT admission CT of the chest was done showing pulmonary embolism in the right lower lobe possible on base infiltrate consistent with infarct. ER has consulted general surgery. Dr. Ninfa Linden has seen and admitted the patient. Plan is to take patient in the morning to OR to reduce the ostomy. However, per NP note patient was not a candidate for surgery. PMY called to consult for goals of care in there face of progressive stage IV colorectal cancer.  Primary Diagnoses  Present on Admission:   Colostomy prolapse  Colorectal cancer, stage IV  Pulmonary embolism  Palliative Review of Systems: No significant pain, no depression, no anxiety. She is fully aware of diagnosis and her response to treatment. No bowel problems - colostomy is working well.   I have reviewed the medical record, interviewed the patient and family, and examined the patient. The following aspects are pertinent.  Past Medical History  Diagnosis Date   Anxiety    Colon cancer     dx'd 2014   History   Social History   Marital Status: Married    Spouse Name: N/A   Number of Children: 3   Years of Education: N/A   Occupational History       Social History Main Topics   Smoking status: Former Smoker -- 1  years    Types: Cigarettes    Quit date: 05/24/1971   Smokeless tobacco: Never Used   Alcohol Use: No   Drug Use: No   Sexual Activity: Not on file   Other Topics Concern   None   Social History Narrative  Patient was married for 23 years and had 3 children. She has 7 grandchildren. She has been remarried for 20 years and  this is a good marriage. Together they have 21 children and grandchildren. 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 Strong faith. She has been very active including going to the gym dispite her diagnosis and treatment. She has discussed her advanced care wishes with her husband and children.  Family History  Problem Relation Age of Onset   Colon cancer Sister 61   Diabetes Neg Hx    Thyroid disease Neg Hx    Breast cancer Maternal Aunt    Prostate cancer Maternal Uncle    Bone cancer Maternal Grandfather    Prostate cancer Maternal Uncle    Scheduled Meds:  lactose free nutrition  237 mL Oral TID WC   Continuous Infusions:  0.9 % NaCl with KCl 20 mEq / L 125 mL/hr at 11/14/14 0932   heparin 1,250 Units/hr (11/14/14 1421)   PRN Meds:.HYDROmorphone (DILAUDID) injection, ondansetron, sodium chloride Medications Prior to Admission:  Prior to Admission medications   Medication Sig Start Date End Date Taking? Authorizing Provider  Aspirin-Salicylamide-Caffeine (BC HEADACHE POWDER PO) Take 1 packet by mouth every 4 (four) hours as needed (headache).   Yes Historical Provider, MD  diphenhydrAMINE (BENADRYL) 25 MG tablet Take 50 mg by mouth every 6 (six) hours as needed for allergies.   Yes Historical Provider, MD  lidocaine-prilocaine (EMLA) cream Apply 1 application topically as needed. Apply to port site one hour before treatment and cover with plastic wrap. 07/02/13  Yes Concha Norway, MD  polyethylene glycol powder (GLYCOLAX/MIRALAX) powder TAKE 34 G (2 DOSES) BY MOUTH DAILY AS NEEDED FOR MILD CONSTIPATION OR MODERATE CONSTIPATION Patient taking differently: TAKE 1-2 DOSES BY MOUTH DAILY AS NEEDED FOR MILD CONSTIPATION OR MODERATE CONSTIPATION 03/03/14  Yes Concha Norway, MD  Wheat Dextrin (BENEFIBER PO) Take 1 tablet by mouth daily as needed (constipation).   Yes Historical Provider, MD    capecitabine (XELODA) 500 MG tablet Take 3 tablets (1,500 mg total) by mouth 2 (two) times daily after a meal. Patient not taking: Reported on 11/13/2014 09/13/14   Truitt Merle, MD  LORazepam (ATIVAN) 0.5 MG tablet Take 1 tablet (0.5 mg total) by mouth every 8 (eight) hours as needed (nausea/vomiting). 04/22/14   Aasim Lona Kettle, MD  ondansetron (ZOFRAN) 8 MG tablet Take 8mg  by mouth every 12 hours as needed for nausea/vomiting.  Take 8 mg by mouth  30 minutes before taking chemo pills, and as needed for nausea/vomiting. 07/15/14   Truitt Merle, MD  oxyCODONE (OXY IR/ROXICODONE) 5 MG immediate release tablet Take 1-2 tablets (5-10 mg total) by mouth every 4 (four) hours as needed for moderate pain, severe pain or breakthrough pain. 10/31/14   Orson Eva, MD  prochlorperazine (COMPAZINE) 10 MG tablet TAKE 1 TABLET (10 MG TOTAL) BY MOUTH EVERY 6 (SIX) HOURS AS NEEDED (NAUSEA OR VOMITING). 01/01/14   Concha Norway, MD   Allergies  Allergen Reactions   Oxycontin [Oxycodone Hcl] Anaphylaxis, Hives and Itching   Codeine Itching and Nausea And Vomiting  CBC Latest Ref Rng 11/14/2014 11/13/2014 10/30/2014  WBC 4.0 - 10.5 K/uL 5.8 7.6 6.2  Hemoglobin 12.0 - 15.0 g/dL 9.2(L) 11.1(L) 10.1(L)  Hematocrit 36.0 - 46.0 % 29.5(L) 34.6(L) 31.4(L)  Platelets 150 - 400 K/uL 279 360 306    CMP Latest Ref Rng 11/13/2014 10/30/2014 10/29/2014  Glucose 70 - 99 mg/dL 97 100(H) 108(H)  BUN 6 - 20 mg/dL 13 7 10   Creatinine 0.44 - 1.00 mg/dL 0.90 0.54 0.57  Sodium 135 - 145 mmol/L 139 138 135  Potassium 3.5 - 5.1 mmol/L 3.1(L) 4.3 4.2  Chloride 101 - 111 mmol/L 109 105 106  CO2 22 - 32 mmol/L 21(L) 26 26  Calcium 8.9 - 10.3 mg/dL 10.6(H) 9.8 9.4  Total Protein 6.5 - 8.1 g/dL 8.0 - -  Total Bilirubin 0.3 - 1.2 mg/dL 0.6 - -  Alkaline Phos 38 - 126 U/L 61 - -  AST 15 - 41 U/L 16 - -  ALT 14 - 54 U/L 9(L) - -   Imaging: CT Abd/Pel  10/24/14: IMPRESSION: 1. The patient's known distal sigmoid colon malignancy has grown into the  lumen of the stent and is causing almost complete obstruction of the lumen with associated dilatation of the colon proximal to the stent with a significant amount of stool and gas in the colon proximal to the stent. 2. Minimal free peritoneal fluid. 3. 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 4. Treated metastasis in the left lobe of the liver. 5. Previously demonstrated hemangiomas in the right lobe of the liver and probable small liver cysts. 6. Stable small probable metastatic left para-aortic lymph node.  Acute abd/cxr 11/13/14: IMPRESSION: Nonobstructive bowel gas pattern. Right-sided colostomy shows large associated soft tissue herniation, consistent with parastomal hernia. Consider abdomen and pelvis CT for further evaluation if clinically warranted.  CT abd/pel 11/13/14: IMPRESSION: Sigmoid colon carcinoma with wall stent in place. Tumor invades the stent and into the surrounding pericolonic fat. Proximal stool-filled colon is more decompressed than on prior study with interval placement of a diverting right ileostomy. Peristomal hernia containing fat. No proximal gastric or small bowel dilatation to suggest obstruction. Multiple liver lesions including a stable treated metastasis and multiple cavernous hemangiomas. 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 Nonobstructing stones in the left kidney. Changes in the right lung base as described are worrisome for pulmonary embolus. If clinically indicated, CT PE study of the chest is suggested.  CT Angio chest 11/13/14:IMPRESSION: Positive study for pulmonary emboli in the right lower lobe pulmonary arteries. Small wedge-shaped infiltration in the right lung base is likely an infarct.   Physical Exam: Vital Signs: BP 122/53 mmHg   Pulse 80   Temp(Src) 98.1 F (36.7 C) (Oral)   Resp 18   Ht 5\' 5"  (1.651 m)   Wt 66.4 kg (146 lb 6.2 oz)   BMI 24.36 kg/m2   SpO2 100% SpO2: SpO2: 100 % O2 Device: O2 Device:  Not Delivered O2 Flow Rate:   Intake/output summary:  Intake/Output Summary (Last 24 hours) at 11/14/14 1527 Last data filed at 11/14/14 0932  Gross per 24 hour  Intake 1693.64 ml  Output    350 ml  Net 1343.64 ml   LBM:   Baseline Weight: Weight: 66.4 kg (146 lb 6.2 oz) Most recent weight: Weight: 66.4 kg (146 lb 6.2 oz)  Exam Findings:  Filed Vitals:   11/14/14 0639  BP: 122/53  Pulse: 80  Temp: 98.1 F (36.7 C)  Resp: 18  Gen'l- WNWD black woman looking younger than her stated age 64 - C&S clear Cor - RRR Pulm - normal respirations Neuro - fully awake and alert.          Palliative Performance Scale: 80%              Additional Data Reviewed: Recent Labs     11/13/14  1815  11/14/14  0322  WBC  7.6  5.8  HGB  11.1*  9.2*  PLT  360  279  NA  139   --   BUN  13   --   CREATININE  0.90   --    CMP Latest Ref Rng 11/13/2014 10/30/2014 10/29/2014  Glucose 70 - 99 mg/dL 97 100(H) 108(H)  BUN 6 - 20 mg/dL 13 7 10   Creatinine 0.44 - 1.00 mg/dL 0.90 0.54 0.57  Sodium 135 - 145 mmol/L 139 138 135  Potassium 3.5 - 5.1 mmol/L 3.1(L) 4.3 4.2  Chloride 101 - 111 mmol/L 109 105 106  CO2 22 - 32 mmol/L 21(L) 26 26  Calcium 8.9 - 10.3 mg/dL 10.6(H) 9.8 9.4  Total Protein 6.5 - 8.1 g/dL 8.0 - -  Total Bilirubin 0.3 - 1.2 mg/dL 0.6 - -  Alkaline Phos 38 - 126 U/L 61 - -  AST 15 - 41 U/L 16 - -  ALT 14 - 54 U/L 9(L) - -    Had a long conversation with the patient. She is aware of her diagnosis and does not have any palliative care needs at this point. For pain the scheduled use of APAP 500 mg tid is recommended. It was clarified that "pain free" is not the goal.  PE diagnosed this admission and she has been on heparin. Choice of long term anti-coagulant, i.e. Coumadin vs NOAC, is deferred to internal medicine and/or Dr. Burr Medico for oncology.  Please call us back if additional issues arise. Thank you for this consult.  Time In: 1527 Time Out: 1643 Time Total: 76  min  Greater than 50%  of this time was spent counseling and coordinating care related to the above assessment and plan.  Signed by: Neena Rhymes, MD Palliative Care Team  [facility] Provider Link Found]  11/14/2014, 3:27 PM  Please contact Palliative Medicine Team phone at [phone] for questions and concerns.

## 2014-11-14 NOTE — Progress Notes (Signed)
VASCULAR LAB PRELIMINARY  PRELIMINARY  PRELIMINARY  PRELIMINARY  Bilateral lower extremity venous duplex completed.    Preliminary report:  Bilateral:  No evidence of DVT, superficial thrombosis, or Baker's Cyst.   [name] [name], RVS 11/14/2014, 8:42 AM

## 2014-11-15 DIAGNOSIS — K9409 Other complications of colostomy: Secondary | ICD-10-CM

## 2014-11-15 LAB — CBC
HCT: 29.3 % — ABNORMAL LOW (ref 36.0–46.0)
Hemoglobin: 9.3 g/dL — ABNORMAL LOW (ref 12.0–15.0)
MCH: 26.8 pg (ref 26.0–34.0)
MCHC: 31.7 g/dL (ref 30.0–36.0)
MCV: 84.4 fL (ref 78.0–100.0)
PLATELETS: 287 10*3/uL (ref 150–400)
RBC: 3.47 MIL/uL — ABNORMAL LOW (ref 3.87–5.11)
RDW: 13 % (ref 11.5–15.5)
WBC: 6.7 10*3/uL (ref 4.0–10.5)

## 2014-11-15 LAB — HEPARIN LEVEL (UNFRACTIONATED): Heparin Unfractionated: 0.41 IU/mL (ref 0.30–0.70)

## 2014-11-15 MED ORDER — HYDROMORPHONE HCL 2 MG PO TABS
1.0000 mg | ORAL_TABLET | ORAL | Status: DC | PRN
  Administered 2014-11-15: 1 mg via ORAL
  Administered 2014-11-15 – 2014-11-16 (×3): 2 mg via ORAL
  Filled 2014-11-15 (×4): qty 1

## 2014-11-15 MED ORDER — ENOXAPARIN SODIUM 100 MG/ML ~~LOC~~ SOLN
100.0000 mg | SUBCUTANEOUS | Status: DC
  Administered 2014-11-15: 100 mg via SUBCUTANEOUS
  Filled 2014-11-15: qty 1

## 2014-11-15 MED ORDER — ACETAMINOPHEN 500 MG PO TABS
500.0000 mg | ORAL_TABLET | Freq: Three times a day (TID) | ORAL | Status: DC
  Administered 2014-11-15 (×3): 500 mg via ORAL
  Filled 2014-11-15 (×4): qty 1

## 2014-11-15 MED ORDER — RIVAROXABAN (XARELTO) VTE STARTER PACK (15 & 20 MG): ORAL_TABLET | ORAL | Status: DC

## 2014-11-15 MED ORDER — RIVAROXABAN 15 MG PO TABS
15.0000 mg | ORAL_TABLET | Freq: Two times a day (BID) | ORAL | Status: DC
  Administered 2014-11-16: 15 mg via ORAL
  Filled 2014-11-15: qty 1

## 2014-11-15 NOTE — Progress Notes (Signed)
Chaplain had avery pleasant visit with patient. Patient told chaplain that her husband is a Theme park manager. Chaplain and patient talked about the goodness of the [place]. Chaplain will follow with patient.    11/15/14 1200  Clinical Encounter Type  Visited With Patient  Visit Type Initial;Psychological support;Spiritual support;Social support  Referral From [employer]

## 2014-11-15 NOTE — Discharge Instructions (Addendum)
No lifting over 5 pounds.  Try not to do any straining.  Low fiber, low residue diet as well discussed.  Go to your primary care doctors office next week as a walk in so he can help manage your Xarelto; if you are going to run out of the medication before you see him, call us.  Keep appointment with Dr. Zella Richer later this month.  If you have severe shortness of breath that persists, go to the emergency room immediately.    Information on my medicine - XARELTO (rivaroxaban)  This medication education was reviewed with me or my healthcare representative as part of my discharge preparation.  The pharmacist that spoke with me during my hospital stay was:  [place]? Xarelto was prescribed to treat blood clots that may have been found in the veins of your legs (deep vein thrombosis) or in your lungs (pulmonary embolism) and to reduce the risk of them occurring again.  What do you need to know about Xarelto? The starting dose is one 15 mg tablet taken TWICE daily with food for the FIRST 21 DAYS then on (enter date) 6/4  the dose is changed to one 20 mg tablet taken ONCE A DAY with your evening meal.  DO NOT stop taking Xarelto without talking to the health care provider who prescribed the medication.  Refill your prescription for 20 mg tablets before you run out.  After discharge, you should have regular check-up appointments with your healthcare provider that is prescribing your Xarelto.  In the future your dose may need to be changed if your kidney function changes by a significant amount.  What do you do if you miss a dose? If you are taking Xarelto TWICE DAILY and you miss a dose, take it as soon as you remember. You may take two 15 mg tablets (total 30 mg) at the same time then resume your regularly scheduled 15 mg twice daily the next day.  If you are taking Xarelto ONCE DAILY and you miss a dose, take it as soon as you remember on the same  day then continue your regularly scheduled once daily regimen the next day. Do not take two doses of Xarelto at the same time.   Important Safety Information Xarelto is a blood thinner medicine that can cause bleeding. You should call your healthcare provider right away if you experience any of the following: ? Bleeding from an injury or your nose that does not stop. ? Unusual colored urine (red or dark brown) or unusual colored stools (red or black). ? Unusual bruising for unknown reasons. ? A serious fall or if you hit your head (even if there is no bleeding).  Some medicines may interact with Xarelto and might increase your risk of bleeding while on Xarelto. To help avoid this, consult your healthcare provider or pharmacist prior to using any new prescription or non-prescription medications, including herbals, vitamins, non-steroidal anti-inflammatory drugs (NSAIDs) and supplements.  This website has more information on Xarelto: [email or web address]

## 2014-11-15 NOTE — Progress Notes (Addendum)
Pt's PCP is Dr. Lorra Hals. A call was made to Dr. Leim Fabry office [phone], there are no opening for new pt's until June.  However pt can do a walk in per Network engineer.  Pt has Medicaid for medication insurance for Xarelto. Pt selected [place] for [facility] needs.

## 2014-11-15 NOTE — Progress Notes (Signed)
Patient ID: [name] [name], female   DOB: 09-23-1950, 64 y.o.   MRN: [id number]     [facility]      [address] [place], [place] [address]    Phone: [phone] FAX: [phone]     Subjective: Pain is better.  VSS.  AFebrile.   Objective:  Vital signs:  Filed Vitals:   11/14/14 0230 11/14/14 0639 11/14/14 2103 11/15/14 0441  BP: 121/66 122/53 126/59 114/53  Pulse: 93 80 77 85  Temp: 98 F (36.7 C) 98.1 F (36.7 C) 98.2 F (36.8 C) 98.2 F (36.8 C)  TempSrc: Oral Oral Oral Oral  Resp: '18 18 17 16  ' Height:      Weight:      SpO2: 98% 100% 100% 100%    Last BM Date: 11/14/14  Intake/Output   Yesterday:  05/12 0701 - 05/13 0700 In: 2936.7 [I.V.:2936.7] Out: 750 [Stool:750] This shift:  Total I/O In: -  Out: 100 [Stool:100]    Physical Exam: General: Pt awake/alert/oriented x4 in no acute distress Chest: cta bilaterally. No chest wall pain w good excursion CV: Pulses intact. Regular rhythm MS: Normal AROM mjr joints. No obvious deformity Abdomen: Soft. Nondistended. Non tender. Prolapsed colostomy, with areas of necrosis, stool output. No evidence of peritonitis. No incarcerated hernias. Ext: SCDs BLE. No mjr edema. No cyanosis Skin: No petechiae / purpura  Problem List:   Active Problems:   Colorectal cancer, stage IV   Colostomy prolapse   Pulmonary embolism    Results:   Labs: Results for orders placed or performed during the hospital encounter of 11/13/14 (from the past 48 hour(s))  CBC with Differential     Status: Abnormal   Collection Time: 11/13/14  6:15 PM  Result Value Ref Range   WBC 7.6 4.0 - 10.5 K/uL   RBC 4.16 3.87 - 5.11 MIL/uL   Hemoglobin 11.1 (L) 12.0 - 15.0 g/dL   HCT 34.6 (L) 36.0 - 46.0 %   MCV 83.2 78.0 - 100.0 fL   MCH 26.7 26.0 - 34.0 pg   MCHC 32.1 30.0 - 36.0 g/dL   RDW 12.9 11.5 - 15.5 %   Platelets 360 150 - 400 K/uL   Neutrophils Relative % 68 43 -  77 %   Neutro Abs 5.2 1.7 - 7.7 K/uL   Lymphocytes Relative 24 12 - 46 %   Lymphs Abs 1.8 0.7 - 4.0 K/uL   Monocytes Relative 5 3 - 12 %   Monocytes Absolute 0.4 0.1 - 1.0 K/uL   Eosinophils Relative 3 0 - 5 %   Eosinophils Absolute 0.2 0.0 - 0.7 K/uL   Basophils Relative 0 0 - 1 %   Basophils Absolute 0.0 0.0 - 0.1 K/uL  Comprehensive metabolic panel     Status: Abnormal   Collection Time: 11/13/14  6:15 PM  Result Value Ref Range   Sodium 139 135 - 145 mmol/L   Potassium 3.1 (L) 3.5 - 5.1 mmol/L   Chloride 109 101 - 111 mmol/L   CO2 21 (L) 22 - 32 mmol/L   Glucose, Bld 97 70 - 99 mg/dL   BUN 13 6 - 20 mg/dL   Creatinine, Ser 0.90 0.44 - 1.00 mg/dL   Calcium 10.6 (H) 8.9 - 10.3 mg/dL   Total Protein 8.0 6.5 - 8.1 g/dL   Albumin 3.5 3.5 - 5.0 g/dL   AST 16 15 - 41 U/L   ALT 9 (L) 14 -  54 U/L   Alkaline Phosphatase 61 38 - 126 U/L   Total Bilirubin 0.6 0.3 - 1.2 mg/dL   GFR calc non Af Amer >60 >60 mL/min   GFR calc Af Amer >60 >60 mL/min    Comment: (NOTE) The eGFR has been calculated using the CKD EPI equation. This calculation has not been validated in all clinical situations. eGFR's persistently <60 mL/min signify possible Chronic Kidney Disease.    Anion gap 9 5 - 15  Urinalysis, Routine w reflex microscopic     Status: Abnormal   Collection Time: 11/13/14  8:08 PM  Result Value Ref Range   Color, Urine YELLOW YELLOW   APPearance CLEAR CLEAR   Specific Gravity, Urine 1.016 1.005 - 1.030   pH 7.0 5.0 - 8.0   Glucose, UA NEGATIVE NEGATIVE mg/dL   Hgb urine dipstick NEGATIVE NEGATIVE   Bilirubin Urine NEGATIVE NEGATIVE   Ketones, ur 15 (A) NEGATIVE mg/dL   Protein, ur NEGATIVE NEGATIVE mg/dL   Urobilinogen, UA 0.2 0.0 - 1.0 mg/dL   Nitrite NEGATIVE NEGATIVE   Leukocytes, UA NEGATIVE NEGATIVE    Comment: MICROSCOPIC NOT DONE ON URINES WITH NEGATIVE PROTEIN, BLOOD, LEUKOCYTES, NITRITE, OR GLUCOSE <1000 mg/dL.  APTT     Status: None   Collection Time: 11/13/14 11:02  PM  Result Value Ref Range   aPTT 24 24 - 37 seconds  Protime-INR     Status: None   Collection Time: 11/13/14 11:02 PM  Result Value Ref Range   Prothrombin Time 13.9 11.6 - 15.2 seconds   INR 1.05 0.00 - 1.49  CBC     Status: Abnormal   Collection Time: 11/14/14  3:22 AM  Result Value Ref Range   WBC 5.8 4.0 - 10.5 K/uL   RBC 3.50 (L) 3.87 - 5.11 MIL/uL   Hemoglobin 9.2 (L) 12.0 - 15.0 g/dL   HCT 29.5 (L) 36.0 - 46.0 %   MCV 84.3 78.0 - 100.0 fL   MCH 26.3 26.0 - 34.0 pg   MCHC 31.2 30.0 - 36.0 g/dL   RDW 13.0 11.5 - 15.5 %   Platelets 279 150 - 400 K/uL  Troponin I (q 6hr x 3)     Status: None   Collection Time: 11/14/14  3:22 AM  Result Value Ref Range   Troponin I <0.03 <0.031 ng/mL    Comment:        NO INDICATION OF MYOCARDIAL INJURY.   Heparin level (unfractionated)     Status: Abnormal   Collection Time: 11/14/14  8:08 AM  Result Value Ref Range   Heparin Unfractionated 0.24 (L) 0.30 - 0.70 IU/mL    Comment:        IF HEPARIN RESULTS ARE BELOW EXPECTED VALUES, AND PATIENT DOSAGE HAS BEEN CONFIRMED, SUGGEST FOLLOW UP TESTING OF ANTITHROMBIN III LEVELS.   Troponin I (q 6hr x 3)     Status: None   Collection Time: 11/14/14  8:08 AM  Result Value Ref Range   Troponin I <0.03 <0.031 ng/mL    Comment:        NO INDICATION OF MYOCARDIAL INJURY.   Troponin I (q 6hr x 3)     Status: None   Collection Time: 11/14/14  2:00 PM  Result Value Ref Range   Troponin I <0.03 <0.031 ng/mL    Comment:        NO INDICATION OF MYOCARDIAL INJURY.   Heparin level (unfractionated)     Status: Abnormal   Collection Time: 11/14/14  8:05 PM  Result Value Ref Range   Heparin Unfractionated 0.18 (L) 0.30 - 0.70 IU/mL    Comment:        IF HEPARIN RESULTS ARE BELOW EXPECTED VALUES, AND PATIENT DOSAGE HAS BEEN CONFIRMED, SUGGEST FOLLOW UP TESTING OF ANTITHROMBIN III LEVELS.   CBC     Status: Abnormal   Collection Time: 11/15/14  5:05 AM  Result Value Ref Range   WBC 6.7  4.0 - 10.5 K/uL   RBC 3.47 (L) 3.87 - 5.11 MIL/uL   Hemoglobin 9.3 (L) 12.0 - 15.0 g/dL   HCT 29.3 (L) 36.0 - 46.0 %   MCV 84.4 78.0 - 100.0 fL   MCH 26.8 26.0 - 34.0 pg   MCHC 31.7 30.0 - 36.0 g/dL   RDW 13.0 11.5 - 15.5 %   Platelets 287 150 - 400 K/uL  Heparin level (unfractionated)     Status: None   Collection Time: 11/15/14  5:05 AM  Result Value Ref Range   Heparin Unfractionated 0.41 0.30 - 0.70 IU/mL    Comment:        IF HEPARIN RESULTS ARE BELOW EXPECTED VALUES, AND PATIENT DOSAGE HAS BEEN CONFIRMED, SUGGEST FOLLOW UP TESTING OF ANTITHROMBIN III LEVELS.     Imaging / Studies: Ct Angio Chest Pe W/cm &/or Wo Cm  11/13/2014   CLINICAL DATA:  Shortness of breath. Possible pulmonary embolus identified on CT abdomen and pelvis. History of colon cancer.  EXAM: CT ANGIOGRAPHY CHEST WITH CONTRAST  TECHNIQUE: Multidetector CT imaging of the chest was performed using the standard protocol during bolus administration of intravenous contrast. Multiplanar CT image reconstructions and MIPs were obtained to evaluate the vascular anatomy.  CONTRAST:  152m OMNIPAQUE IOHEXOL 350 MG/ML SOLN  COMPARISON:  CT chest abdomen and pelvis 07/01/2014. CT abdomen and pelvis 11/13/2014.  FINDINGS: Technically adequate study with good opacification of the central and segmental pulmonary arteries. Filling defects are demonstrated in the right lower lobe and segmental branches to the right lower lobe pulmonary arteries consistent with acute pulmonary embolus. Wedge-shaped peripheral infiltration in the right lung base probably represents focal infarct. Atelectasis also demonstrated in the lung bases.  Normal heart size. Normal caliber thoracic aorta. No aortic dissection. Great vessel origins are patent. No significant lymphadenopathy in the chest. Esophagus is decompressed. No focal pulmonary nodules are demonstrated to suggest metastatic disease. No pleural effusions. No pneumothorax. Airways are patent.  Degenerative changes in the spine. No destructive bone lesions appreciated.  Review of the MIP images confirms the above findings.  IMPRESSION: Positive study for pulmonary emboli in the right lower lobe pulmonary arteries. Small wedge-shaped infiltration in the right lung base is likely an infarct.  These results were called by telephone at the time of interpretation on 11/13/2014 at 11:56 pm to Dr. GColin Rhein who verbally acknowledged these results.   Electronically Signed   By: WLucienne CapersM.D.   On: 11/13/2014 23:58   Ct Abdomen Pelvis W Contrast  11/13/2014   CLINICAL DATA:  Colon cancer. Abdominal pain for 1 week. Nausea and vomiting. Unable to drink oral contrast material.  EXAM: CT ABDOMEN AND PELVIS WITH CONTRAST  TECHNIQUE: Multidetector CT imaging of the abdomen and pelvis was performed using the standard protocol following bolus administration of intravenous contrast.  CONTRAST:  1073mOMNIPAQUE IOHEXOL 300 MG/ML  SOLN  COMPARISON:  10/24/2014  FINDINGS: There is suggestion of a focal filling defect in a visualized right lower lobe pulmonary artery. There is also a small wedge-shaped peripheral  infiltrate in the right lung base. Findings may indicate pulmonary embolus. If clinically suspicious, CT pulmonary embolus study of the chest is suggested.  The liver demonstrates multiple focal lesions. In segment 4 of the liver, there is a calcified lesion measuring about 1 cm diameter corresponding to unknown treated metastasis. This is not change since prior study. In segment 7 of the liver towards the dome, there is a 16 mm lesion which has enhancement features consistent with cavernous hemangioma. In segment 5-6 of the liver, there is a 8.1 cm diameter lesion which has enhancement characteristics most consistent with cavernous hemangioma. Tiny sub cm lesions are also present likely representing cysts. Lesions are similar to prior study. Heterogeneous mass lesion in the anterior spleen measuring 5.5 cm  diameter consistent with metastasis. No significant change in appearance. Small accessory spleen. The gallbladder, pancreas, adrenal glands, abdominal aorta, and inferior vena cava are unremarkable. Scattered perirectal lymph nodes are upper limits of normal size and may be metastatic. 8 mm stone in the lower pole left kidney. No hydronephrosis or hydroureter in either kidney. Punctate stone also in the upper pole left kidney. No change since prior study. Stomach and small bowel are not abnormally distended. There is a a right lower quadrant ileostomy, new since prior study. There is a large peristomal hernia containing fat. The colon is diffusely stool-filled and has decompressed since previous study. No free air or free fluid in the abdomen. Postoperative changes along the midline.  Pelvis: Sigmoid colon mass consistent with known cancer. Wall stent is in place. Tumor appears to have invaded the stent and infiltrated into the pericolonic fat. Appearance is similar to prior study. Proximal colonic dilatation has decreased, likely due to the diverting colostomy. Small amount of free fluid in the pelvis is likely reactive. Uterus appears surgically absent. Appendix is normal. Bladder wall is not thickened. Degenerative changes in the spine. No destructive bone lesions appreciated.  IMPRESSION: Sigmoid colon carcinoma with wall stent in place. Tumor invades the stent and into the surrounding pericolonic fat. Proximal stool-filled colon is more decompressed than on prior study with interval placement of a diverting right ileostomy. Peristomal hernia containing fat. No proximal gastric or small bowel dilatation to suggest obstruction. Multiple liver lesions including a stable treated metastasis and multiple cavernous hemangiomas. 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 Nonobstructing stones in the left kidney. Changes in the right lung base as described are worrisome for pulmonary embolus. If clinically  indicated, CT PE study of the chest is suggested.  These results were called by telephone at the time of interpretation on 11/13/2014 at 9:57 pm to Dr. Shirlyn Goltz , who verbally acknowledged these results.   Electronically Signed   By: Lucienne Capers M.D.   On: 11/13/2014 22:02   Dg Abd Acute W/chest  11/13/2014   CLINICAL DATA:  Severe abdominal pain and vomiting. Previous history of colostomy for colon carcinoma.  EXAM: DG ABDOMEN ACUTE W/ 1V CHEST  COMPARISON:  Radiographs and CT on 10/24/2014  FINDINGS: No evidence of dilated bowel loops. An intraluminal stent is again seen within the distal sigmoid colon. A new right abdominal colostomy is seen with large associated soft tissue herniation, consistent with parastomal hernia. No evidence free intraperitoneal air.  IMPRESSION: Nonobstructive bowel gas pattern. Right-sided colostomy shows large associated soft tissue herniation, consistent with parastomal hernia. Consider abdomen and pelvis CT for further evaluation if clinically warranted.   Electronically Signed   By: Earle Gell M.D.   On:  11/13/2014 19:15    Medications / Allergies:  Scheduled Meds: . enoxaparin (LOVENOX) injection  100 mg Subcutaneous Q24H  . lactose free nutrition  237 mL Oral TID WC   Continuous Infusions: . 0.9 % NaCl with KCl 20 mEq / L 125 mL/hr at 11/15/14 0147   PRN Meds:.HYDROmorphone (DILAUDID) injection, ondansetron, sodium chloride  Antibiotics: Anti-infectives    None       Assessment/Plan Stage IV obstructing colon cancer growing into stent at rectosigmoid junction extending inferiorly into proximal rectum (lowest extent is 10 cm from anal verge) S/p transverse loop colostomy ---Dr. Zella Richer  Prolapsed colostomy with areas of necrosis Unfortunately, there is nothing surgically we can offer her. She was just diagnosed with PEs. The ostomy is functioning. I will advance her diet  Appreciate palliative care consult.  Pt remains a full code and will  have ongoing treatment with Dr. Burr Medico Pulmonary emboli-on lovenox, will start Xarelto and follow up in the wellness clinic.  Medication risks, benefits and therapeutic alternatives were reviewed with the patient.  She verbalizes understanding.  Will also consult to pharmacy. FEN-add PO dilaudid, advance diet Dispo-anticipate DC tomorrow.  I have sent Rx for starter pack for Xarelto, further Rx from PCP  Erby Pian, [facility] Pager [phone]([phone])   11/15/2014 9:13 AM

## 2014-11-15 NOTE — Progress Notes (Signed)
[facility]  Patient Status: Active (receiving services up to time of hospitalization)  [facility] is providing the following services: RN  If patient discharges after hours, please call [phone].   Lurlean Leyden 11/15/2014, 4:15 PM

## 2014-11-15 NOTE — Progress Notes (Signed)
ANTICOAGULATION CONSULT NOTE - Follow Up Consult  Pharmacy Consult for Heparin Indication: pulmonary embolus  Allergies  Allergen Reactions  . Oxycontin [Oxycodone Hcl] Anaphylaxis, Hives and Itching  . Codeine Itching and Nausea And Vomiting    Patient Measurements: Height: 5\' 5"  (165.1 cm) Weight: 146 lb 6.2 oz (66.4 kg) IBW/kg (Calculated) : 57 Heparin Dosing Weight:   Vital Signs: Temp: 98.2 F (36.8 C) (05/13 0441) Temp Source: Oral (05/13 0441) BP: 114/53 mmHg (05/13 0441) Pulse Rate: 85 (05/13 0441)  Labs:  Recent Labs  11/13/14 1815 11/13/14 2302 11/14/14 0322 11/14/14 0808 11/14/14 1400 11/14/14 2005 11/15/14 0505  HGB 11.1*  --  9.2*  --   --   --  9.3*  HCT 34.6*  --  29.5*  --   --   --  29.3*  PLT 360  --  279  --   --   --  287  APTT  --  24  --   --   --   --   --   LABPROT  --  13.9  --   --   --   --   --   INR  --  1.05  --   --   --   --   --   HEPARINUNFRC  --   --   --  0.24*  --  0.18* 0.41  CREATININE 0.90  --   --   --   --   --   --   TROPONINI  --   --  <0.03 <0.03 <0.03  --   --     Estimated Creatinine Clearance: 58.3 mL/min (by C-G formula based on Cr of 0.9).   Medications:  Infusions:  . 0.9 % NaCl with KCl 20 mEq / L 125 mL/hr at 11/15/14 0147  . heparin 1,500 Units/hr (11/14/14 2115)    Assessment: Patient with heparin level at goal.  No heparin issues per RN.  Goal of Therapy:  Heparin level 0.3-0.7 units/ml Monitor platelets by anticoagulation protocol: Yes   Plan:  Continue heparin drip at current rate Recheck level at [address], [place] 11/15/2014,6:17 AM

## 2014-11-15 NOTE — Progress Notes (Signed)
Patient ID: [name] [name], female   DOB: 14-Jan-1951, 64 y.o.   MRN: [id number] [facility] PROGRESS NOTE  [name] [name] UQJ:[id number] DOB: 1951/05/17 DOA: 11/13/2014 PCP: [facility]  Brief narrative:    64 year old female with a history of a distal colon cancer, status post colostomy, recently underwent palliative loop transverse colostomy. She presented to [facility] with ostomy that started to prolapse for past few days prior to this admission. Patient also had diffuse abdominal pain associated with nausea and intermittent nonbloody vomiting. Hospital course complicated with new finding of pulmonary embolism on CT scan. Surgery has consulted [facility] for input on management on PE. Patient does not feel comfortable with Lovenox injections she will be discharged with xarelto.   Assessment/Plan:    Active Problems:   Colorectal cancer, stage IV / Colostomy prolapse - Per surgery - No further plans for surgery - Palliative care consulted for goals of care    Pulmonary embolism - Patient does not feel comfortable with Lovenox on discharge for treatment of pulmonary embolism. Recommendation made for xarelto on discharge.  - [facility] does provide samples for xarelto and pt can follow up in their clinicfor refills. - Start pack for xarelto can be provided on discharge.  Code Status: Full.  Family Communication:  plan of care discussed with the patient Disposition Plan: Home per primary     Leisa Lenz, MD  [facility] Pager [phone]  Time spent in minutes: 15 minutes  If 7PM-7AM, please contact night-coverage [email or web address] Password [email or web address] 11/15/2014, 12:30 PM   LOS: 2 days    HPI/Subjective: No acute overnight events. Patient reports feeling ok this am.   Objective: Filed Vitals:   11/14/14 0230 11/14/14 0639 11/14/14 2103 11/15/14 0441  BP: 121/66 122/53 126/59 114/53  Pulse: 93 80 77 85  Temp: 98 F (36.7 C)  98.1 F (36.7 C) 98.2 F (36.8 C) 98.2 F (36.8 C)  TempSrc: Oral Oral Oral Oral  Resp: 18 18 17 16   Height:      Weight:      SpO2: 98% 100% 100% 100%    Intake/Output Summary (Last 24 hours) at 11/15/14 1230 Last data filed at 11/15/14 1056  Gross per 24 hour  Intake   2500 ml  Output    600 ml  Net   1900 ml    Exam:   General:  Pt is alert, follows commands appropriately, not in acute distress  Cardiovascular: Regular rate and rhythm, S1/S2, no murmurs  Respiratory: Clear to auscultation bilaterally, no wheezing, no crackles, no rhonchi  Abdomen: colostomy (+), tender abdomen , (+) BS  Extremities: No edema, pulses DP and PT palpable bilaterally  Neuro: Grossly nonfocal  Data Reviewed: Basic Metabolic Panel:  Recent Labs Lab 11/13/14 1815  NA 139  K 3.1*  CL 109  CO2 21*  GLUCOSE 97  BUN 13  CREATININE 0.90  CALCIUM 10.6*   Liver Function Tests:  Recent Labs Lab 11/13/14 1815  AST 16  ALT 9*  ALKPHOS 61  BILITOT 0.6  PROT 8.0  ALBUMIN 3.5   No results for input(s): LIPASE, AMYLASE in the last 168 hours. No results for input(s): AMMONIA in the last 168 hours. CBC:  Recent Labs Lab 11/13/14 1815 11/14/14 0322 11/15/14 0505  WBC 7.6 5.8 6.7  NEUTROABS 5.2  --   --   HGB 11.1* 9.2* 9.3*  HCT 34.6* 29.5* 29.3*  MCV 83.2 84.3 84.4  PLT 360 279 287  Cardiac Enzymes:  Recent Labs Lab 11/14/14 0322 11/14/14 0808 11/14/14 1400  TROPONINI <0.03 <0.03 <0.03   BNP: Invalid input(s): POCBNP CBG: No results for input(s): GLUCAP in the last 168 hours.  No results found for this or any previous visit (from the past 240 hour(s)).   Scheduled Meds: . acetaminophen  500 mg Oral TID  . lactose free nutrition  237 mL Oral TID WC  . [START ON 11/16/2014] Rivaroxaban  15 mg Oral BID WC   Continuous Infusions: . 0.9 % NaCl with KCl 20 mEq / L 125 mL/hr at 11/15/14 [phone]

## 2014-11-16 LAB — CBC
HCT: 32.1 % — ABNORMAL LOW (ref 36.0–46.0)
Hemoglobin: 10 g/dL — ABNORMAL LOW (ref 12.0–15.0)
MCH: 26 pg (ref 26.0–34.0)
MCHC: 31.2 g/dL (ref 30.0–36.0)
MCV: 83.6 fL (ref 78.0–100.0)
Platelets: 290 10*3/uL (ref 150–400)
RBC: 3.84 MIL/uL — ABNORMAL LOW (ref 3.87–5.11)
RDW: 12.9 % (ref 11.5–15.5)
WBC: 6.9 10*3/uL (ref 4.0–10.5)

## 2014-11-16 MED ORDER — HYDROMORPHONE HCL 4 MG PO TABS
2.0000 mg | ORAL_TABLET | Freq: Four times a day (QID) | ORAL | Status: DC | PRN
Start: 2014-11-16 — End: 2015-01-07

## 2014-11-16 NOTE — Progress Notes (Signed)
NCM contacted [facility] for resumption of care for [facility] RN. Pt is active with [facility]. Jonnie Finner RN CCM Case Mgmt phone [phone]

## 2014-11-16 NOTE — Care Management Note (Signed)
Case Management Note  Patient Details  Name: NAILANI FULL MRN: [id number] Date of Birth: April 07, 1951  Subjective/Objective:  Cancer, PE             Action/Plan:   Expected Discharge Date:                 Expected Discharge Plan:  [place]  In-House Referral:     Discharge planning Services  CM Consult  Post Acute Care Choice:    Choice offered to:     DME Arranged:    DME Agency:     HH Arranged:  RN [place] Agency:  [facility]  Status of Service:  Completed, signed off  Medicare Important Message Given:  No Date Medicare IM Given:    Medicare IM give by:    Date Additional Medicare IM Given:    Additional Medicare Important Message give by:     If discussed at [place] of Stay Meetings, dates discussed:    Additional Comments: NCM spoke to pt and she has Xarelto 30 day free trial card. Notified [facility] of pt's scheduled dc home today with HH, resumption of care.  Erenest Rasher, RN 11/16/2014, 10:40 AM

## 2014-11-16 NOTE — Progress Notes (Signed)
Subjective: Tolerating some of her diet.  No significant pain.  Objective: Vital signs in last 24 hours: Temp:  [98.2 F (36.8 C)-98.9 F (37.2 C)] 98.4 F (36.9 C) (05/14 0520) Pulse Rate:  [89-95] 91 (05/14 0520) Resp:  [16-20] 16 (05/14 0520) BP: (121-139)/(45-72) 125/72 mmHg (05/14 0520) SpO2:  [99 %-100 %] 100 % (05/14 0520) Last BM Date: 11/14/14  Intake/Output from previous day: 05/13 0701 - 05/14 0700 In: 3240 [P.O.:240; I.V.:3000] Out: 200 [Stool:200] Intake/Output this shift:    PE: General- In NAD Abdomen-soft, and flat, afferent limb of colostomy with moderate swelling and brown liquid stool output, efferent limb prolapsed with significant swelling and some areas of mucosal prolapse, prolapse is partial reducible  Lab Results:   Recent Labs  11/15/14 0505 11/16/14 0527  WBC 6.7 6.9  HGB 9.3* 10.0*  HCT 29.3* 32.1*  PLT 287 290   BMET  Recent Labs  11/13/14 1815  NA 139  K 3.1*  CL 109  CO2 21*  GLUCOSE 97  BUN 13  CREATININE 0.90  CALCIUM 10.6*   PT/INR  Recent Labs  11/13/14 2302  LABPROT 13.9  INR 1.05   Comprehensive Metabolic Panel:    Component Value Date/Time   NA 139 11/13/2014 1815   NA 138 10/30/2014 0535   NA 140 10/11/2014 0757   NA 140 09/13/2014 0807   K 3.1* 11/13/2014 1815   K 4.3 10/30/2014 0535   K 4.1 10/11/2014 0757   K 4.0 09/13/2014 0807   CL 109 11/13/2014 1815   CL 105 10/30/2014 0535   CO2 21* 11/13/2014 1815   CO2 26 10/30/2014 0535   CO2 23 10/11/2014 0757   CO2 21* 09/13/2014 0807   BUN 13 11/13/2014 1815   BUN 7 10/30/2014 0535   BUN 15.5 10/11/2014 0757   BUN 8.9 09/13/2014 0807   CREATININE 0.90 11/13/2014 1815   CREATININE 0.54 10/30/2014 0535   CREATININE 0.7 10/11/2014 0757   CREATININE 0.7 09/13/2014 0807   GLUCOSE 97 11/13/2014 1815   GLUCOSE 100* 10/30/2014 0535   GLUCOSE 70 10/11/2014 0757   GLUCOSE 86 09/13/2014 0807   CALCIUM 10.6* 11/13/2014 1815   CALCIUM 9.8 10/30/2014  0535   CALCIUM 11.0* 10/11/2014 0757   CALCIUM 10.8* 09/13/2014 0807   AST 16 11/13/2014 1815   AST 12 10/27/2014 0518   AST 14 10/11/2014 0757   AST 14 09/13/2014 0807   ALT 9* 11/13/2014 1815   ALT 8 10/27/2014 0518   ALT 9 10/11/2014 0757   ALT 9 09/13/2014 0807   ALKPHOS 61 11/13/2014 1815   ALKPHOS 53 10/27/2014 0518   ALKPHOS 68 10/11/2014 0757   ALKPHOS 66 09/13/2014 0807   BILITOT 0.6 11/13/2014 1815   BILITOT 0.2* 10/27/2014 0518   BILITOT <0.20 10/11/2014 0757   BILITOT <0.20 09/13/2014 0807   PROT 8.0 11/13/2014 1815   PROT 6.9 10/27/2014 0518   PROT 7.6 10/11/2014 0757   PROT 7.6 09/13/2014 0807   ALBUMIN 3.5 11/13/2014 1815   ALBUMIN 3.0* 10/27/2014 0518   ALBUMIN 3.2* 10/11/2014 0757   ALBUMIN 3.3* 09/13/2014 0807     Studies/Results: No results found.  Anti-infectives: Anti-infectives    None      Assessment Active Problems:   Colorectal cancer, stage IV, obstructing s/p palliative colonic diversion   Transverse loop colostomy prolapse-had some of this preadmission but prolapse increased in size when she had nausea and vomiting; some necrotic areas on efferent limb that will slough off;  significant edema of efferent limb as well; no plan for colostomy revision at this time.   Pulmonary embolism-on Xarelto    LOS: 3 days   Plan: Discharge on low residue diet.  Continue HHRN to assist with loop colostomy.  Xarelto for PE.  Follow up with me later this week.   [name] [name] J 11/16/2014

## 2014-11-16 NOTE — Progress Notes (Signed)
Reviewed discharge information with patient and caregiver. Answered all questions. Patient/caregiver able to teach back medications and reasons to contact MD/911. Patient verbalizes importance of PCP follow up appointment.   

## 2014-11-18 ENCOUNTER — Telehealth: Payer: Self-pay | Admitting: *Deleted

## 2014-11-18 ENCOUNTER — Other Ambulatory Visit: Payer: Self-pay | Admitting: *Deleted

## 2014-11-18 NOTE — Telephone Encounter (Signed)
Discussed with Dr Burr Medico & OK to cancel CT.  Notified Central Scheduling & patient.  Pt will keep 11/22/14 appt.

## 2014-11-18 NOTE — Telephone Encounter (Signed)
Pt called and left message wanting to know if pt should repeat CT scan again on 11/20/14.  Pt stated she was in the hospital last week and had CT scan done which showed that pt had pulmonary embolism.  Pt has CT Chest scheduled prior to office visit on 11/22/14. Pt's  Phone    [phone].

## 2014-11-20 ENCOUNTER — Ambulatory Visit (HOSPITAL_COMMUNITY): Payer: Medicaid Other

## 2014-11-22 ENCOUNTER — Ambulatory Visit (HOSPITAL_BASED_OUTPATIENT_CLINIC_OR_DEPARTMENT_OTHER): Payer: Medicaid Other

## 2014-11-22 ENCOUNTER — Telehealth: Payer: Self-pay | Admitting: Hematology

## 2014-11-22 ENCOUNTER — Other Ambulatory Visit (HOSPITAL_BASED_OUTPATIENT_CLINIC_OR_DEPARTMENT_OTHER): Payer: Medicaid Other

## 2014-11-22 ENCOUNTER — Ambulatory Visit (HOSPITAL_BASED_OUTPATIENT_CLINIC_OR_DEPARTMENT_OTHER): Payer: Medicaid Other | Admitting: Hematology

## 2014-11-22 VITALS — BP 105/70 | HR 94 | Temp 98.4°F | Resp 18 | Ht 65.0 in | Wt 145.8 lb

## 2014-11-22 DIAGNOSIS — Z5111 Encounter for antineoplastic chemotherapy: Secondary | ICD-10-CM | POA: Diagnosis not present

## 2014-11-22 DIAGNOSIS — C7989 Secondary malignant neoplasm of other specified sites: Secondary | ICD-10-CM

## 2014-11-22 DIAGNOSIS — R634 Abnormal weight loss: Secondary | ICD-10-CM | POA: Diagnosis not present

## 2014-11-22 DIAGNOSIS — C19 Malignant neoplasm of rectosigmoid junction: Secondary | ICD-10-CM

## 2014-11-22 DIAGNOSIS — C787 Secondary malignant neoplasm of liver and intrahepatic bile duct: Secondary | ICD-10-CM

## 2014-11-22 DIAGNOSIS — Z95828 Presence of other vascular implants and grafts: Secondary | ICD-10-CM

## 2014-11-22 DIAGNOSIS — R5383 Other fatigue: Secondary | ICD-10-CM

## 2014-11-22 DIAGNOSIS — C189 Malignant neoplasm of colon, unspecified: Secondary | ICD-10-CM

## 2014-11-22 LAB — COMPREHENSIVE METABOLIC PANEL (CC13)
ALK PHOS: 58 U/L (ref 40–150)
ALT: 7 U/L (ref 0–55)
AST: 13 U/L (ref 5–34)
Albumin: 2.5 g/dL — ABNORMAL LOW (ref 3.5–5.0)
Anion Gap: 11 mEq/L (ref 3–11)
BUN: 10.6 mg/dL (ref 7.0–26.0)
CO2: 22 mEq/L (ref 22–29)
Calcium: 9.8 mg/dL (ref 8.4–10.4)
Chloride: 109 mEq/L (ref 98–109)
Creatinine: 0.6 mg/dL (ref 0.6–1.1)
GLUCOSE: 111 mg/dL (ref 70–140)
Potassium: 3.6 mEq/L (ref 3.5–5.1)
SODIUM: 141 meq/L (ref 136–145)
Total Bilirubin: <0.2 mg/dL (ref 0.20–1.20)
Total Protein: 6.6 g/dL (ref 6.4–8.3)

## 2014-11-22 LAB — CBC WITH DIFFERENTIAL/PLATELET
BASO%: 0.9 % (ref 0.0–2.0)
BASOS ABS: 0.1 10*3/uL (ref 0.0–0.1)
EOS%: 3.7 % (ref 0.0–7.0)
Eosinophils Absolute: 0.2 10*3/uL (ref 0.0–0.5)
HCT: 29.1 % — ABNORMAL LOW (ref 34.8–46.6)
HEMOGLOBIN: 9.4 g/dL — AB (ref 11.6–15.9)
LYMPH%: 19.9 % (ref 14.0–49.7)
MCH: 26.5 pg (ref 25.1–34.0)
MCHC: 32.4 g/dL (ref 31.5–36.0)
MCV: 81.8 fL (ref 79.5–101.0)
MONO#: 0.4 10*3/uL (ref 0.1–0.9)
MONO%: 6.3 % (ref 0.0–14.0)
NEUT%: 69.2 % (ref 38.4–76.8)
NEUTROS ABS: 4 10*3/uL (ref 1.5–6.5)
Platelets: 379 10*3/uL (ref 145–400)
RBC: 3.55 10*6/uL — ABNORMAL LOW (ref 3.70–5.45)
RDW: 13.3 % (ref 11.2–14.5)
WBC: 5.8 10*3/uL (ref 3.9–10.3)
lymph#: 1.2 10*3/uL (ref 0.9–3.3)

## 2014-11-22 LAB — CEA: CEA: 7.3 ng/mL — ABNORMAL HIGH (ref 0.0–5.0)

## 2014-11-22 MED ORDER — SODIUM CHLORIDE 0.9 % IJ SOLN
10.0000 mL | INTRAMUSCULAR | Status: DC | PRN
  Administered 2014-11-22: 10 mL via INTRAVENOUS
  Filled 2014-11-22: qty 10

## 2014-11-22 MED ORDER — HEPARIN SOD (PORK) LOCK FLUSH 100 UNIT/ML IV SOLN
500.0000 [IU] | Freq: Once | INTRAVENOUS | Status: AC
  Administered 2014-11-22: 500 [IU] via INTRAVENOUS
  Filled 2014-11-22: qty 5

## 2014-11-22 NOTE — Telephone Encounter (Signed)
Gave and printed appt sched and avs for pt [name] [name]

## 2014-11-22 NOTE — Patient Instructions (Signed)

## 2014-11-22 NOTE — Progress Notes (Signed)
[facility] PROGRESS NOTE    DIAGNOSIS: Metastatic colon cancer to liver   MOLECULAR FEATURES ([facility]):   Chief complaint: Follow-up colon cancer  CURRENT TREATMENT:  Oral Xeloda 1598m (8532mm2) bid, 2 weeks on, one week off, as maintenance therapy started 07/16/2014     Colorectal cancer, stage IV, obstructing s/p colonic diversion   05/22/2013 - 05/27/2013 Hospital Admission A/w abdominal distention and constipation for 2 days CT scan of her abdomen and pelvis showed area of bowel wall thickening at the rectosigmoid junction concerning for malignancy.   05/22/2013 Imaging CT of Abdomen: bowel wall thickening with shouldering of the proximal and distal margins, involving therectosigmoid junction, supicous liver spleen mets. Colonsocopy recommended.    05/23/2013 Tumor Marker CEA 225.7   05/24/2013 Imaging MRI of abdomen: 3.7 x 4.4 cm enhancing lesion along the superior spleen, indeterminate but worrisome for metastasis.2.7 x 3.9 cm enhancing lesion in the medial segment left hepatic lobe,    05/25/2013 Pathology Results Diagnosis Rectum, biopsy, proximal - INVASIVE ADENOCARCINOMA.   05/25/2013 Procedure Flex sig: (Dr. JaArdis Hughs 5-6 cm obstructing rectosigmoid mass distal end 10 cm from anal verge; mass biopsed and then stented 9 cm long, 22 cm diameter uncovered SEM.    05/25/2013 Initial Diagnosis Colorectal cancer, stage IV   06/25/2013 Procedure R port a cath placement.   07/09/2013 - 08/20/2013 Chemotherapy FOLFOX q 2 weeks started.  Not elgible for clinical trial and bevacimab based on stent and high risk for perforation. She received a total of 4 cycles.    07/30/2013 Tumor Marker KRAS positive.  p53, APC identified. Mismatch Repair (MMR) preserved.    08/06/2013 Adverse Reaction Complains of darkening of her hands with peeling.    08/27/2013 Family History She was seen by Genetic couselor.  She declined testing today, but will call and reschedule an  appointment should she decide to pursue testing.   09/07/2013 Imaging CT abdomen: Mixed response to therapy. Response to therapy of hepatic metastasis. right hepatic hemangiomas are again identified. Other too small to characterize liver lesions are felt to be similar but are indeterminate.. Enlargement of a splenic lesions   09/13/2013 - 06/10/2014 Chemotherapy Second line chemo FOLFIRI for a total of 16 cycles    09/13/2013 Tumor Marker CEA 33.5   09/13/2013 Treatment Plan Change Reviewed scans consistent with mixed response.  Switch to FOLFIRI, she received a total of 16 cycles, with a few breaks due to hospitalization and chemo holiday.    10/29/2013 Adverse Reaction Patient reports intense abdominal cramping lasting the first few days on chemotherapy.  Starting Hyoscyamine 0.125 mg q 6 hours prn.    11/15/2013 - 11/15/2013 Hospital Admission Patient presented to ED with migrated stent.  Stent retrieved by Dr. JaArdis Hughs Imaging negative for perforation.   Discussed in conference with referral to Dr. RoZella Richerade.    11/23/2013 Imaging CT Chest. 1. Stable left lower lobe 5 mm pulmonary nodule. 2. Interval increase in size splenic mass is concerning formetastatic lesion.3. Interval calcification of the left hepatic lobe metastasis. Noevidence of active residual disease by CT.   07/01/2014 Imaging CT restaging scan showed stable disease, improved liver lesion.    07/16/2014 -  Chemotherapy maintenance chemo with capacitain 100077m2 (1800m28mid started, dose decresed to 1500mg62m on C1D8 due to tolerance issue. Pt declined surgery.    08/16/2014 - 08/20/2014 Hospital Admission She was admitted for bowel obstruction from sigmoid colon mass. She declined surgery, and had sigmoid colon stent placement. Her  symptoms resolved afterwards.   10/25/2014 -  Hospital Admission Admitted for bowel obstruction, CT scan showed sigmoid colon tumor growth through the stent, pt underwent Transverse loop colostomy on 10/28/2014.     11/13/2014 - 11/18/2014 Hospital Admission she was admitted for transverse loop colostomy prolapse, managed conservatively. CT chest showed RLL PE, discharged home on Xarelto     INTERVAL HISTORY:  [name] [name] 64 y.o. female with a history of Stage IV colon cancer is here for follow-up.  She was admitted on 10/25/2014 for bowel obstruction. CT scan showed sigmoid colon tumor growth through the stent.  She underwent transverse loop colostomy on 10/28/2014. She was admitted again on 11/13/2014 for colostomy prolapse, was managed conservatively. CT scan incidentally found a right lower lobe PE, she was discharged home on Xarelto.   She has been recovering from the most recent hospitalization, her appetite and energy level has improved, she is able to tolerate daily activities without difficulty, although overall still feels weak. No significant abdominal pain, nausea or vomiting.Marland Kitchen Her bowel movement are most loose. Her appetite is moderate, denies any fever, chill.   Past Medical History  Diagnosis Date  . Anxiety   . Colon cancer     dx'd 2014    ALLERGIES:  is allergic to oxycontin and codeine.  MEDICATIONS: has a current medication list which includes the following prescription(s): hydromorphone, lidocaine-prilocaine, lorazepam, ondansetron, polyethylene glycol powder, prochlorperazine, rivaroxaban, and aspirin-salicylamide-caffeine, and the following Facility-Administered Medications: sodium chloride.  SURGICAL HISTORY:  Past Surgical History  Procedure Laterality Date  . Abdominal hysterectomy  2005  . Cesarean section  1976  . Flexible sigmoidoscopy N/A 05/25/2013    Procedure: FLEXIBLE SIGMOIDOSCOPY;  Surgeon: [name], MD;  Location: [facility];  Service: Endoscopy;  Laterality: N/A;  needs floro  . Colonic stent placement N/A 05/25/2013    Procedure: COLONIC STENT PLACEMENT;  Surgeon: [name], MD;  Location: [facility];  Service: Endoscopy;  Laterality: N/A;    . Portacath placement Right 06/25/2013    Procedure: ULTRA SOUND GUIDED INSERTION PORT-A-CATH;  Surgeon: [name], MD;  Location: [facility];  Service: General;  Laterality: Right;  . Flexible sigmoidoscopy N/A 08/19/2014    Procedure: FLEXIBLE SIGMOIDOSCOPY;  Surgeon: Gatha Mayer, MD;  Location: [facility];  Service: Endoscopy;  Laterality: N/A;  . Colonic stent placement N/A 08/19/2014    Procedure: COLONIC STENT PLACEMENT;  Surgeon: Gatha Mayer, MD;  Location: [facility];  Service: Endoscopy;  Laterality: N/A;  . Flexible sigmoidoscopy N/A 08/19/2014    Procedure: FLEXIBLE SIGMOIDOSCOPY;  Surgeon: Gatha Mayer, MD;  Location: [facility];  Service: Endoscopy;  Laterality: N/A;  . Colonic stent placement N/A 08/19/2014    Procedure: COLONIC STENT PLACEMENT;  Surgeon: Gatha Mayer, MD;  Location: [facility];  Service: Endoscopy;  Laterality: N/A;  . Colon resection N/A 10/28/2014    Procedure: Transverse loop colostomy;  Surgeon: [name], MD;  Location: [facility];  Service: General;  Laterality: N/A;    REVIEW OF SYSTEMS:   Constitutional: Denies fevers, chills or abnormal weight loss Eyes: Denies blurriness of vision Ears, nose, mouth, throat, and face: Denies mucositis or sore throat Respiratory: Denies cough, dyspnea or wheezes Cardiovascular: Denies palpitation, chest discomfort or lower extremity swelling Gastrointestinal:  Denies nausea, heartburn or change in bowel habits Skin: Denies abnormal skin rashes Lymphatics: Denies new lymphadenopathy or easy bruising Neurological:Denies numbness, tingling or new weaknesses Behavioral/Psych: Mood is stable, no new changes  All other systems were reviewed with the patient and are negative.  PHYSICAL EXAMINATION: ECOG PERFORMANCE STATUS: 1-2  Blood pressure 105/70, pulse 94, temperature 98.4 F (36.9 C), temperature source Oral, resp. rate 18, height 5' 5" (1.651 m), weight 145 lb 12.8 oz  (66.134 kg), SpO2 100 %.  GENERAL:alert, no distress and comfortable; well-developed, well nourished.  SKIN: skin color, texture, turgor are normal, no rashes or significant lesions; + R Port a cath without tenderness; hyperpigmentation of her hands.  EYES: normal, Conjunctiva are pink and non-injected, sclera clear  OROPHARYNX:no exudate, no erythema and lips, buccal mucosa  NECK: supple, thyroid normal size, non-tender, without nodularity  LYMPH: no palpable lymphadenopathy in the cervical, axillary or supraclavicular  LUNGS: clear to auscultation and percussion with normal breathing effort  HEART: Tachycardic with regular rhythm and no murmurs and no lower extremity edema  ABDOMEN:abdomen soft, non-tender and normal bowel sounds. (+) Colostomy prolapse about 16 cm, dark mucus at the open, watery stool in the colostomy bag. Musculoskeletal:no cyanosis of digits and no clubbing  NEURO: alert & oriented x 3 with fluent speech, no focal motor/sensory deficits EXTREMITIES: Positive for thickening of nail bed, mild pinkish discharge from the left third nail bed, mild tenderness, no skin erythema or swelling.  Labs:  CBC Latest Ref Rng 11/22/2014 11/16/2014 11/15/2014  WBC 3.9 - 10.3 10e3/uL 5.8 6.9 6.7  Hemoglobin 11.6 - 15.9 g/dL 9.4(L) 10.0(L) 9.3(L)  Hematocrit 34.8 - 46.6 % 29.1(L) 32.1(L) 29.3(L)  Platelets 145 - 400 10e3/uL 379 290 287   CMP Latest Ref Rng 11/22/2014 11/13/2014 10/30/2014  Glucose 70 - 140 mg/dl 111 97 100(H)  BUN 7.0 - 26.0 mg/dL 10._0 Creatinine 0.6 - 1.1 mg/dL 0.6 0.90 0.54  Sodium 136 - 145 mEq/L 141 139 138  Potassium 3.5 - 5.1 mEq/L 3.6 3.1(L) 4.3  Chloride 101 - 111 mmol/L - 109 105  CO2 22 - 29 mEq/L 22 21(L) 26  Calcium 8.4 - 10.4 mg/dL 9.8 10.6(H) 9.8  Total Protein 6.4 - 8.3 g/dL 6.6 8.0 -  Total Bilirubin 0.20 - 1.20 mg/dL <0.20 0.6 -  Alkaline Phos 40 - 150 U/L 58 61 -  AST 5 - 34 U/L 13 16 -  ALT 0 - 55 U/L 7 9(L) -      CEA  Status:  Finalresult Visible to patient:  Not Released Nextappt: 12/13/2014 at 12:30 PM in Oncology ([facility] ONLY) Dx:  Metastatic colon cancer to liver              Ref Range 1d ago  64moago  256mogo     CEA 0.0 - 5.0 ng/mL 7.3 (H) 6.6 (H) 4.1           RADIOGRAPHIC STUDIES: CT abdomen and pelvis 11/13/2014  IMPRESSION: Sigmoid colon carcinoma with wall stent in place. Tumor invades the stent and into the surrounding pericolonic fat. Proximal stool-filled colon is more decompressed than on prior study with interval placement of a diverting right ileostomy. Peristomal hernia containing fat. No proximal gastric or small bowel dilatation to suggest obstruction. Multiple liver lesions including a stable treated metastasis and multiple cavernous hemangiomas. 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 Nonobstructing stones in the left kidney. Changes in the right lung base as described are worrisome for pulmonary embolus. If clinically indicated, CT PE study of the chest is suggested.  CT chest 11/13/2014 IMPRESSION: Positive study for pulmonary emboli in the right lower lobe pulmonary arteries. Small wedge-shaped  infiltration in the right lung base is likely an infarct.  ASSESSMENT: [name] [name] 64 y.o. female with a history of stage IV colon adenocarcinoma, KRAS mutation(+), on capecitabine maintenance therapy..   1. 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 -She has disease progression at the primary site which caused stent obstruction, s/p diverging colostomy. Liver and spleen mets are stable.  -I discussed the option of restarting chemo. She had mFOLFOX 4 cycles at the beginning, stable disease, held due to some side effects (fatigue and nail change), her tumor is probably still  sensitive to oxaliplatin. I would consider adding Avastin to chemotherapy, which she had never had before. Since she has diverging colostomy now, the risk of tumor perforation is not very high. Single agent irinotecan with or without avastin is also an reasonable option. -Due to the K-ras mutation, she is not candidate for EGFR inhibitor therapy -Her tumor has stable MSI, unlikely will response to immunotherapy -She is still recovering from her recent hospitalization and surgery, would prefer to have a longer time for recovery. She would think about chemotherapy. - She has strong faith in God and prefers to maintain her current quality of life.   2.  Colostomy prolapse -Follow up with surgeon Dr. Lutricia Feil  3. Family history of Colon cancer.  --Based on her age and family history (sister deceased at age 63 from colon cancer), referral for genetic testing for Lynch syndrome, i.e. MSI or IHC, would be appropriate. We made a referral to genetics. Her tumor was negative for MSI high (Lynch syndrome). She was seen on 02/23 but declined further testing for genetic syndromes presently.   4. 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  -chronic elevated Ca, stable. She knows to drink more fluids, avoid dehydration -zometa 68m iv on 07/25/13 and 06/12/2014. She had some abdominal discomfort and after Zometa infusion, and is hesitated to have more at this point. -Her calcium is 9.8 today. We'll continue watch.  5. Fatigue and weight loss -I encouraged her to drink and eat more, consider nutrition supplement -Exercise as she can tolerate  PLAN - return to clinic in 2 weeks, we'll discuss about chemotherapy again on next visit.    All questions were answered. The patient knows to call the clinic with any problems, questions or concerns. We can certainly see the patient much sooner if necessary.  I spent 20 minutes counseling the patient face to face. The total time spent in the appointment was 25  minutes.  FTruitt Merle 11/22/2014

## 2014-11-23 ENCOUNTER — Encounter: Payer: Self-pay | Admitting: Hematology

## 2014-11-25 NOTE — Discharge Summary (Signed)
Physician Discharge Summary  Patient ID: [name] [name] MRN: [id number] DOB/AGE: 1951/02/03 64 y.o.  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 Abdominal pain, nausea and vomiting S/p diverting transverse loop colostomy, 10/28/14, Dr. Sherren Mocha, now with new significant prolapse. 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 Abdominal pain, nausea and vomiting S/p diverting transverse loop colostomy, 10/28/14, Dr. Sherren Mocha, now with new significant prolapse. History of anxiety Pulmonary Embolism discharged on Xarelto  Active Problems:   Colorectal cancer, stage IV, obstructing s/p colonic diversion   Colostomy prolapse   Pulmonary embolism   PROCEDURES: None  Hospital Course:  This is an unfortunate 64 year old female with a history of a distal colon cancer that was strictured. An endoscopic stent was placed however this is now involved with malignancy. She has stage IV disease. She most recently underwent a palliative loop transverse colostomy. She has actually had prolapse of the ostomy which Dr. Zella Richer was able to reduce in the office. Over the last several days, the ostomy has started to prolapse further. She has had some abdominal pain with nausea and vomiting. She has not had stool output for several days. She was seen in the ED and admitted by Dr. Ninfa Linden.  CT of the abdomen was ordered on 11/13/14 to look for obstruction and there was a possible PE on this study.  CT of the chest was then obtained and this confirmed PE in the RLL with small wedge shape infiltrate on the right most likely in infarct.  After evaluation we were planning to take her to the OR to reduce the large prolapse but with the findings of new PE this was canceled.  She was seen by Medicine and  Palliative care.  Medicine placed her on heparin and eventually Xarelto.  Palliative Medicine saw her and patient declined any further palliative treatment at this time, she was pain free on his exam. Her ostomy was functioning well without any significant necrosis.  She was seen by Dr. Zella Richer and discharged on 11/16/14 with plans to follow up with him the following week.     CT of the abdomen:  Sigmoid colon carcinoma with wall stent in place. Tumor invades the stent and into the surrounding pericolonic fat. Proximal stool-filled colon is more decompressed than on prior study with interval placement of a diverting right ileostomy. Peristomal hernia containing fat. No proximal gastric or small bowel dilatation to suggest obstruction. Multiple liver lesions including a stable treated metastasis and multiple cavernous hemangiomas. 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 Nonobstructing stones in the left kidney. Changes in the right lung base as described are worrisome for pulmonary embolus. If clinically indicated, CT PE study of the chest is suggested.   CT chest:  Positive study for pulmonary emboli in the right lower lobe pulmonary arteries. Small wedge-shaped infiltration in the right lung base is likely an infarct.  CBC Latest Ref Rng 11/22/2014 11/16/2014 11/15/2014  WBC 3.9 - 10.3 10e3/uL 5.8 6.9 6.7  Hemoglobin 11.6 - 15.9 g/dL 9.4(L) 10.0(L) 9.3(L)  Hematocrit 34.8 - 46.6 % 29.1(L) 32.1(L) 29.3(L)  Platelets 145 - 400 10e3/uL 379 290 287   CMP Latest Ref Rng 11/22/2014 11/13/2014 10/30/2014  Glucose 70 - 140 mg/dl 111 97 100(H)  BUN 7.0 - 26.0 mg/dL 10.6 13 7   Creatinine 0.6 - 1.1 mg/dL 0.6 0.90 0.54  Sodium 136 - 145 mEq/L 141 139 138  Potassium 3.5 - 5.1 mEq/L 3.6 3.1(L) 4.3  Chloride 101 - 111 mmol/L - 109 105  CO2 22 - 29 mEq/L 22 21(L) 26  Calcium 8.4 - 10.4 mg/dL 9.8 10.6(H) 9.8  Total Protein 6.4 - 8.3 g/dL 6.6 8.0 -  Total Bilirubin 0.20 - 1.20 mg/dL  <0.20 0.6 -  Alkaline Phos 40 - 150 U/L 58 61 -  AST 5 - 34 U/L 13 16 -  ALT 0 - 55 U/L 7 9(L) -   Condition on D/C:  Stable    She was to follow up with with Dr. Zella Richer in a week, I will be sure an appointment is obtained thru the office.   Disposition: 06-Home-Health Care Svc     Medication List    STOP taking these medications        BENEFIBER PO     capecitabine 500 MG tablet  Commonly known as:  XELODA     diphenhydrAMINE 25 MG tablet  Commonly known as:  BENADRYL     oxyCODONE 5 MG immediate release tablet  Commonly known as:  Oxy IR/ROXICODONE      TAKE these medications        BC HEADACHE POWDER PO  Take 1 packet by mouth every 4 (four) hours as needed (headache).     HYDROmorphone 4 MG tablet  Commonly known as:  DILAUDID  Take 0.5 tablets (2 mg total) by mouth every 6 (six) hours as needed for severe pain.     lidocaine-prilocaine cream  Commonly known as:  EMLA  Apply 1 application topically as needed. Apply to port site one hour before treatment and cover with plastic wrap.     LORazepam 0.5 MG tablet  Commonly known as:  ATIVAN  Take 1 tablet (0.5 mg total) by mouth every 8 (eight) hours as needed (nausea/vomiting).     ondansetron 8 MG tablet  Commonly known as:  ZOFRAN  Take 8mg  by mouth every 12 hours as needed for nausea/vomiting.  Take 8 mg by mouth  30 minutes before taking chemo pills, and as needed for nausea/vomiting.     polyethylene glycol powder powder  Commonly known as:  GLYCOLAX/MIRALAX  TAKE 34 G (2 DOSES) BY MOUTH DAILY AS NEEDED FOR MILD CONSTIPATION OR MODERATE CONSTIPATION     prochlorperazine 10 MG tablet  Commonly known as:  COMPAZINE  TAKE 1 TABLET (10 MG TOTAL) BY MOUTH EVERY 6 (SIX) HOURS AS NEEDED (NAUSEA OR VOMITING).     Rivaroxaban 15 & 20 MG Tbpk  Commonly known as:  XARELTO STARTER PACK  Take as directed on package: Start with one 15mg  tablet by mouth twice a day with food. On Day 22, switch to one 20mg   tablet once a day with food.           Follow-up Information    Follow up with Philis Fendt, MD.   Specialty:  Internal Medicine   Why:  Walk in to see PCP Dr. Suezanne Cheshire 9-4 Myna Hidalgo, TH, F,   Contact information:   [place]  [address] [phone]       Follow up with [facility].   Why:  Home Health RN   Contact information:   [address] [place] [address] [phone]       Signed: Earnstine Regal 11/25/2014, 4:09 PM

## 2014-12-03 ENCOUNTER — Encounter: Payer: Self-pay | Admitting: General Surgery

## 2014-12-03 NOTE — Progress Notes (Signed)
[name] [name] 12/03/2014 9:00 AM Location: [facility] Patient #: [id number] DOB: [birth year] Married / Language: English / Race: Black or African American Female History of Present Illness [name] Hollingshead MD; 12/03/2014 9:45 AM) Patient words: 3 week PO transverse loo colostomy.  The patient is a 64 year old female    Note:Procedure: Palliative transverse loop colostomy  Date: October 28, 2014  Pathology: Stage IV obstructing colon cancer  History: She presents for another postoperative visit. She was recently in the hospital but has been doing well ever since. She is eating. No nausea or vomiting. She still has significant prolapse. This occurred when she had significant nausea and vomiting starting after the loop colostomy was performed. She saw Dr. Burr Medico on 11/22/14 and talked about another chemotherapy regimen. She she will be seeing Dr. Burr Medico next week to talk more about chemotherapy. She wants to do everything she can to have a good quality of life. The prolapsed colostomy is difficult to deal with and is interfering with her quality of life. She is passing some thin dark blood from her rectum. I explained to her that this was probably coming from the obstructing tumor. There is not a whole lot we can do about this.  Exam: General- Is in NAD. Abd-soft, right upper quadrant loop colostomy is present, she has a 15 cm prolapse of the efferent limb that is partially reducible, there is no ischemia.  Allergies Elbert Ewings, CMA; 12/03/2014 9:01 AM) Codeine Phosphate *ANALGESICS - OPIOID* OxyCODONE HCl (Abuse Deter) *ANALGESICS - OPIOID* Rash, Swollen lips.  Medication History Elbert Ewings, CMA; 12/03/2014 9:01 AM) Ondansetron HCl (8MG  Tablet, Oral) Active. Xeloda (500MG  Tablet, Oral) Active. Colace (100MG  Capsule, Oral) Active. EMLA (2.5-2.5% Cream, External) Active. Ativan (0.5MG  Tablet, Oral) Active. Zofran (8MG  Tablet, Oral) Active. OxyCODONE  HCl (5MG  Tablet, Oral) Active. Compazine (10MG  Tablet, Oral) Active. Medications Reconciled    Vitals Elbert Ewings CMA; 12/03/2014 9:02 AM) 12/03/2014 9:01 AM Weight: 148 lb Height: 65in Body Surface Area: 1.75 m Body Mass Index: 24.63 kg/m Temp.: 98.36F(Oral)  Pulse: 89 (Regular)  Resp.: 17 (Unlabored)  BP: 128/70 (Sitting, Left Arm, Standard)     Assessment & Plan [name] Hollingshead MD; 12/03/2014 9:40 AM)  STAGE IV CARCINOMA OF COLON (153.9  C18.9)  S/P COLOSTOMY (V44.3  Z93.3) Impression: Has a 15 cm prolapse that is now asymptomatic but difficult to take care of. This prolapse occurred after she had some significant nausea and vomiting soon after the palliative loop colostomy was done.  Plan: We discussed options of doing nothing or revising the distal limp of the loop colostomy. She would like to proceed with revision as she wants to have a good quality of life. She understands this may delay the start of her chemotherapy. Thus, will plan on loop colostomy revision. We discussed the procedure and risks including but not limited to bleeding, infection, recurrent prolapse, anesthesia. She seems to understand all of this and wants to proceed.  Current Plans Schedule for Surgery  [name], MD

## 2014-12-13 ENCOUNTER — Ambulatory Visit (HOSPITAL_BASED_OUTPATIENT_CLINIC_OR_DEPARTMENT_OTHER): Payer: Medicaid Other | Admitting: Hematology

## 2014-12-13 ENCOUNTER — Ambulatory Visit: Payer: Medicaid Other

## 2014-12-13 ENCOUNTER — Other Ambulatory Visit (HOSPITAL_BASED_OUTPATIENT_CLINIC_OR_DEPARTMENT_OTHER): Payer: Medicaid Other

## 2014-12-13 ENCOUNTER — Telehealth: Payer: Self-pay | Admitting: Hematology

## 2014-12-13 ENCOUNTER — Other Ambulatory Visit: Payer: Self-pay | Admitting: *Deleted

## 2014-12-13 ENCOUNTER — Other Ambulatory Visit (HOSPITAL_BASED_OUTPATIENT_CLINIC_OR_DEPARTMENT_OTHER): Payer: Medicaid Other | Admitting: *Deleted

## 2014-12-13 VITALS — BP 130/63 | HR 96 | Temp 98.1°F | Resp 18 | Ht 65.0 in | Wt 144.7 lb

## 2014-12-13 DIAGNOSIS — C189 Malignant neoplasm of colon, unspecified: Secondary | ICD-10-CM

## 2014-12-13 DIAGNOSIS — C19 Malignant neoplasm of rectosigmoid junction: Secondary | ICD-10-CM | POA: Diagnosis not present

## 2014-12-13 DIAGNOSIS — C7989 Secondary malignant neoplasm of other specified sites: Secondary | ICD-10-CM

## 2014-12-13 DIAGNOSIS — D500 Iron deficiency anemia secondary to blood loss (chronic): Secondary | ICD-10-CM

## 2014-12-13 DIAGNOSIS — R634 Abnormal weight loss: Secondary | ICD-10-CM | POA: Diagnosis not present

## 2014-12-13 DIAGNOSIS — Z86711 Personal history of pulmonary embolism: Secondary | ICD-10-CM

## 2014-12-13 DIAGNOSIS — R5383 Other fatigue: Secondary | ICD-10-CM | POA: Diagnosis not present

## 2014-12-13 DIAGNOSIS — C787 Secondary malignant neoplasm of liver and intrahepatic bile duct: Secondary | ICD-10-CM | POA: Diagnosis not present

## 2014-12-13 DIAGNOSIS — D6481 Anemia due to antineoplastic chemotherapy: Secondary | ICD-10-CM

## 2014-12-13 LAB — COMPREHENSIVE METABOLIC PANEL (CC13)
ALT: <6 U/L (ref 0–55)
ANION GAP: 8 meq/L (ref 3–11)
AST: 10 U/L (ref 5–34)
Albumin: 2.6 g/dL — ABNORMAL LOW (ref 3.5–5.0)
Alkaline Phosphatase: 54 U/L (ref 40–150)
BUN: 8.6 mg/dL (ref 7.0–26.0)
CALCIUM: 10.6 mg/dL — AB (ref 8.4–10.4)
CHLORIDE: 108 meq/L (ref 98–109)
CO2: 22 mEq/L (ref 22–29)
Creatinine: 0.7 mg/dL (ref 0.6–1.1)
GLUCOSE: 108 mg/dL (ref 70–140)
POTASSIUM: 4.4 meq/L (ref 3.5–5.1)
SODIUM: 138 meq/L (ref 136–145)
TOTAL PROTEIN: 7 g/dL (ref 6.4–8.3)
Total Bilirubin: <0.2 mg/dL (ref 0.20–1.20)

## 2014-12-13 LAB — CBC WITH DIFFERENTIAL/PLATELET
BASO%: 0.6 % (ref 0.0–2.0)
BASOS ABS: 0 10*3/uL (ref 0.0–0.1)
EOS%: 4.1 % (ref 0.0–7.0)
Eosinophils Absolute: 0.3 10*3/uL (ref 0.0–0.5)
HEMATOCRIT: 25.3 % — AB (ref 34.8–46.6)
HGB: 8 g/dL — ABNORMAL LOW (ref 11.6–15.9)
LYMPH%: 18.2 % (ref 14.0–49.7)
MCH: 25.5 pg (ref 25.1–34.0)
MCHC: 31.5 g/dL (ref 31.5–36.0)
MCV: 81 fL (ref 79.5–101.0)
MONO#: 0.4 10*3/uL (ref 0.1–0.9)
MONO%: 5.5 % (ref 0.0–14.0)
NEUT#: 5.3 10*3/uL (ref 1.5–6.5)
NEUT%: 71.6 % (ref 38.4–76.8)
PLATELETS: 387 10*3/uL (ref 145–400)
RBC: 3.13 10*6/uL — ABNORMAL LOW (ref 3.70–5.45)
RDW: 13.9 % (ref 11.2–14.5)
WBC: 7.4 10*3/uL (ref 3.9–10.3)
lymph#: 1.3 10*3/uL (ref 0.9–3.3)

## 2014-12-13 MED ORDER — RIVAROXABAN 20 MG PO TABS: 20.0000 mg | ORAL_TABLET | Freq: Every day | ORAL | Status: DC

## 2014-12-13 NOTE — Progress Notes (Addendum)
[facility] PROGRESS NOTE    DIAGNOSIS: Metastatic colon cancer to liver   MOLECULAR FEATURES ([facility]):   Chief complaint: Follow-up colon cancer  CURRENT TREATMENT:  Observation    Colorectal cancer, stage IV, obstructing s/p colonic diversion   05/22/2013 - 05/27/2013 Hospital Admission A/w abdominal distention and constipation for 2 days CT scan of her abdomen and pelvis showed area of bowel wall thickening at the rectosigmoid junction concerning for malignancy.   05/22/2013 Imaging CT of Abdomen: bowel wall thickening with shouldering of the proximal and distal margins, involving therectosigmoid junction, supicous liver spleen mets. Colonsocopy recommended.    05/23/2013 Tumor Marker CEA 225.7   05/24/2013 Imaging MRI of abdomen: 3.7 x 4.4 cm enhancing lesion along the superior spleen, indeterminate but worrisome for metastasis.2.7 x 3.9 cm enhancing lesion in the medial segment left hepatic lobe,    05/25/2013 Pathology Results Diagnosis Rectum, biopsy, proximal - INVASIVE ADENOCARCINOMA.   05/25/2013 Procedure Flex sig: (Dr. Ardis Hughs). 5-6 cm obstructing rectosigmoid mass distal end 10 cm from anal verge; mass biopsed and then stented 9 cm long, 22 cm diameter uncovered SEM.    05/25/2013 Initial Diagnosis Colorectal cancer, stage IV   06/25/2013 Procedure R port a cath placement.   07/09/2013 - 08/20/2013 Chemotherapy FOLFOX q 2 weeks started.  Not elgible for clinical trial and bevacimab based on stent and high risk for perforation. She received a total of 4 cycles.    07/30/2013 Tumor Marker KRAS positive.  p53, APC identified. Mismatch Repair (MMR) preserved.    08/06/2013 Adverse Reaction Complains of darkening of her hands with peeling.    08/27/2013 Family History She was seen by Genetic couselor.  She declined testing today, but will call and reschedule an appointment should she decide to pursue testing.   09/07/2013 Imaging CT abdomen: Mixed  response to therapy. Response to therapy of hepatic metastasis. right hepatic hemangiomas are again identified. Other too small to characterize liver lesions are felt to be similar but are indeterminate.. Enlargement of a splenic lesions   09/13/2013 - 06/10/2014 Chemotherapy Second line chemo FOLFIRI for a total of 16 cycles    09/13/2013 Tumor Marker CEA 33.5   09/13/2013 Treatment Plan Change Reviewed scans consistent with mixed response.  Switch to FOLFIRI, she received a total of 16 cycles, with a few breaks due to hospitalization and chemo holiday.    10/29/2013 Adverse Reaction Patient reports intense abdominal cramping lasting the first few days on chemotherapy.  Starting Hyoscyamine 0.125 mg q 6 hours prn.    11/15/2013 - 11/15/2013 Hospital Admission Patient presented to ED with migrated stent.  Stent retrieved by Dr. Ardis Hughs.  Imaging negative for perforation.   Discussed in conference with referral to Dr. Zella Richer made.    11/23/2013 Imaging CT Chest. 1. Stable left lower lobe 5 mm pulmonary nodule. 2. Interval increase in size splenic mass is concerning formetastatic lesion.3. Interval calcification of the left hepatic lobe metastasis. Noevidence of active residual disease by CT.   07/01/2014 Imaging CT restaging scan showed stable disease, improved liver lesion.    07/16/2014 - 10/24/2014 Chemotherapy maintenance chemo with capacitain 1049m/m2 (18043m bid started, dose decresed to 150085mid on C1D8 due to tolerance issue. Pt declined surgery. Chemo held due to hospitalization and disease progression.    08/16/2014 - 08/20/2014 Hospital Admission She was admitted for bowel obstruction from sigmoid colon mass. She declined surgery, and had sigmoid colon stent placement. Her symptoms resolved afterwards.   10/25/2014 - 10/31/2014  Hospital Admission Admitted for bowel obstruction, CT scan showed sigmoid colon tumor growth through the stent, pt underwent Transverse loop colostomy on 10/28/2014.    11/13/2014  - 11/18/2014 Hospital Admission she was admitted for transverse loop colostomy prolapse, managed conservatively. CT chest showed RLL PE, discharged home on Xarelto     INTERVAL HISTORY:  [name] [name] 64 y.o. female with a history of Stage IV colon cancer is here for follow-up.  She feels better overall daily. She has a mild to moderate fatigue,  appetite and energy overall improved, able to go to church, do housework, and tolerate daily routine activities. She denies significant pain, nausea. She was seen by surgeon Dr. Zella Richer lately and will have surgery to resect the prolapsed colostomy on 01/03/2015.  Past Medical History  Diagnosis Date  . Anxiety   . Colon cancer     dx'd 2014    ALLERGIES:  is allergic to oxycontin and codeine.  MEDICATIONS: has a current medication list which includes the following prescription(s): acetaminophen, lidocaine-prilocaine, ondansetron, polyethylene glycol powder, rivaroxaban, hydromorphone, lorazepam, and prochlorperazine.  SURGICAL HISTORY:  Past Surgical History  Procedure Laterality Date  . Abdominal hysterectomy  2005  . Cesarean section  1976  . Flexible sigmoidoscopy N/A 05/25/2013    Procedure: FLEXIBLE SIGMOIDOSCOPY;  Surgeon: [name], MD;  Location: [facility];  Service: Endoscopy;  Laterality: N/A;  needs floro  . Colonic stent placement N/A 05/25/2013    Procedure: COLONIC STENT PLACEMENT;  Surgeon: [name], MD;  Location: [facility];  Service: Endoscopy;  Laterality: N/A;  . Portacath placement Right 06/25/2013    Procedure: ULTRA SOUND GUIDED INSERTION PORT-A-CATH;  Surgeon: [name], MD;  Location: [facility];  Service: General;  Laterality: Right;  . Flexible sigmoidoscopy N/A 08/19/2014    Procedure: FLEXIBLE SIGMOIDOSCOPY;  Surgeon: Gatha Mayer, MD;  Location: [facility];  Service: Endoscopy;  Laterality: N/A;  . Colonic stent placement N/A 08/19/2014    Procedure: COLONIC STENT  PLACEMENT;  Surgeon: Gatha Mayer, MD;  Location: [facility];  Service: Endoscopy;  Laterality: N/A;  . Flexible sigmoidoscopy N/A 08/19/2014    Procedure: FLEXIBLE SIGMOIDOSCOPY;  Surgeon: Gatha Mayer, MD;  Location: [facility];  Service: Endoscopy;  Laterality: N/A;  . Colonic stent placement N/A 08/19/2014    Procedure: COLONIC STENT PLACEMENT;  Surgeon: Gatha Mayer, MD;  Location: [facility];  Service: Endoscopy;  Laterality: N/A;  . Colon resection N/A 10/28/2014    Procedure: Transverse loop colostomy;  Surgeon: [name], MD;  Location: [facility];  Service: General;  Laterality: N/A;    REVIEW OF SYSTEMS:   Constitutional: Denies fevers, chills or abnormal weight loss Eyes: Denies blurriness of vision Ears, nose, mouth, throat, and face: Denies mucositis or sore throat Respiratory: Denies cough, dyspnea or wheezes Cardiovascular: Denies palpitation, chest discomfort or lower extremity swelling Gastrointestinal:  Denies nausea, heartburn or change in bowel habits Skin: Denies abnormal skin rashes Lymphatics: Denies new lymphadenopathy or easy bruising Neurological:Denies numbness, tingling or new weaknesses Behavioral/Psych: Mood is stable, no new changes  All other systems were reviewed with the patient and are negative.  PHYSICAL EXAMINATION: ECOG PERFORMANCE STATUS: 1-2  Blood pressure 130/63, pulse 96, temperature 98.1 F (36.7 C), temperature source Oral, resp. rate 18, height _0  (1.651 m), weight 144 lb 11.2 oz (65.635 kg), SpO2 100 %.  GENERAL:alert, no distress and comfortable; well-developed, well nourished.  SKIN: skin color, texture, turgor are normal, no rashes  or significant lesions; + R Port a cath without tenderness; hyperpigmentation of her hands.  EYES: normal, Conjunctiva are pink and non-injected, sclera clear  OROPHARYNX:no exudate, no erythema and lips, buccal mucosa  NECK: supple, thyroid normal size, non-tender, without nodularity  LYMPH:  no palpable lymphadenopathy in the cervical, axillary or supraclavicular  LUNGS: clear to auscultation and percussion with normal breathing effort  HEART: Tachycardic with regular rhythm and no murmurs and no lower extremity edema  ABDOMEN:abdomen soft, non-tender and normal bowel sounds. (+) Colostomy prolapse larger than before, dark mucus at the open, watery stool in the colostomy bag. Musculoskeletal:no cyanosis of digits and no clubbing  NEURO: alert & oriented x 3 with fluent speech, no focal motor/sensory deficits EXTREMITIES: Positive for thickening of nail bed, mild pinkish discharge from the left third nail bed, mild tenderness, no skin erythema or swelling.  Labs:  CBC Latest Ref Rng 12/13/2014 11/22/2014 11/16/2014  WBC 3.9 - 10.3 10e3/uL 7.4 5.8 6.9  Hemoglobin 11.6 - 15.9 g/dL 8.0(L) 9.4(L) 10.0(L)  Hematocrit 34.8 - 46.6 % 25.3(L) 29.1(L) 32.1(L)  Platelets 145 - 400 10e3/uL 387 379 290   CMP Latest Ref Rng 11/22/2014 11/13/2014 10/30/2014  Glucose 70 - 140 mg/dl 111 97 100(H)  BUN 7.0 - 26.0 mg/dL 10._0 Creatinine 0.6 - 1.1 mg/dL 0.6 0.90 0.54  Sodium 136 - 145 mEq/L 141 139 138  Potassium 3.5 - 5.1 mEq/L 3.6 3.1(L) 4.3  Chloride 101 - 111 mmol/L - 109 105  CO2 22 - 29 mEq/L 22 21(L) 26  Calcium 8.4 - 10.4 mg/dL 9.8 10.6(H) 9.8  Total Protein 6.4 - 8.3 g/dL 6.6 8.0 -  Total Bilirubin 0.20 - 1.20 mg/dL <0.20 0.6 -  Alkaline Phos 40 - 150 U/L 58 61 -  AST 5 - 34 U/L 13 16 -  ALT 0 - 55 U/L 7 9(L) -      CEA  Status: Finalresult Visible to patient:  Not Released Nextappt: 12/13/2014 at 12:30 PM in Oncology ([facility] ONLY) Dx:  Metastatic colon cancer to liver              Ref Range 1d ago  71moago  223mogo     CEA 0.0 - 5.0 ng/mL 7.3 (H) 6.6 (H) 4.1           RADIOGRAPHIC STUDIES: CT abdomen and pelvis 11/13/2014  IMPRESSION: Sigmoid colon carcinoma with wall stent in place. Tumor invades the stent and into the surrounding  pericolonic fat. Proximal stool-filled colon is more decompressed than on prior study with interval placement of a diverting right ileostomy. Peristomal hernia containing fat. No proximal gastric or small bowel dilatation to suggest obstruction. Multiple liver lesions including a stable treated metastasis and multiple cavernous hemangiomas. 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 Nonobstructing stones in the left kidney. Changes in the right lung base as described are worrisome for pulmonary embolus. If clinically indicated, CT PE study of the chest is suggested.  CT chest 11/13/2014 IMPRESSION: Positive study for pulmonary emboli in the right lower lobe pulmonary arteries. Small wedge-shaped infiltration in the right lung base is likely an infarct.  ASSESSMENT: CaCHINENYE KATZENBERGER89.o. female with a history of stage IV colon adenocarcinoma, KRAS mutation(+), on capecitabine maintenance therapy..   1. 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 Liver and spleen mets are stable.  -I discussed the option of restarting chemo. She had mFOLFOX 4 cycles at the beginning, stable disease, held due to some side effects (fatigue and nail change), her tumor is probably still sensitive to oxaliplatin. I would consider adding Avastin to chemotherapy, which she had never had before. Since she has diverging colostomy now, the risk of tumor perforation is not very high. Single agent irinotecan with or without avastin is also an reasonable option. -Due to the K-ras mutation, she is not candidate for EGFR inhibitor therapy -Her tumor has stable MSI, unlikely will response to immunotherapy -She is scheduled for surgery in 3 weeks, will hold chemo for now  - She has strong faith in God and prefers to  maintain her current quality of life.   2.  Colostomy prolapse -Follow up with surgeon Dr. Zella Richer, and she is scheduled for surgery on 7/1.   3. PE diagnosed on 11/13/2014 -Continue Xarelto. She is tolerating well. I refilled for her today  4. Anemia, secondary to chemo, and chronic blood loss  -her Hb is 8.0 today, worse than before, she is not symptomatic, does not want blood transfusion for now -will check ferritin and iron level. If very low, will consider IV feraheme. I recommend her to start ferrous sulfate 2 tab daily   5. 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  -chronic elevated Ca, stable. She knows to drink more fluids, avoid dehydration -zometa 72m iv on 07/25/13 and 06/12/2014. She had some abdominal discomfort and after Zometa infusion, and is hesitated to have more at this point. -Her calcium is 9.8 today. We'll continue watch.  6. Fatigue and weight loss -I encouraged her to drink and eat more, consider nutrition supplement -Exercise as she can tolerate  PLAN -surgery 7/1 -RTC in one month   All questions were answered. The patient knows to call the clinic with any problems, questions or concerns. We can certainly see the patient much sooner if necessary.  I spent 20 minutes counseling the patient face to face. The total time spent in the appointment was 25 minutes.  FTruitt Merle 12/13/2014

## 2014-12-13 NOTE — Telephone Encounter (Signed)
GAVE AND PRINTEDA PPT SCHED AND AVS FOR PT FOR OCT

## 2014-12-14 ENCOUNTER — Encounter: Payer: Self-pay | Admitting: Hematology

## 2014-12-14 LAB — CEA: CEA: 10.9 ng/mL — ABNORMAL HIGH (ref 0.0–5.0)

## 2014-12-16 LAB — FERRITIN CHCC: Ferritin: 18 ng/ml (ref 9–269)

## 2014-12-16 LAB — IRON AND TIBC CHCC
Iron: <11 ug/dL — ABNORMAL LOW (ref 41–142)
TIBC: 284 ug/dL (ref 236–444)

## 2014-12-27 ENCOUNTER — Encounter: Payer: Self-pay | Admitting: General Surgery

## 2014-12-27 NOTE — Progress Notes (Signed)
Spoke with Dr. Burr Medico regarding the [place].  Ms. Milner does not need Lovenox bridge.  Will hold Xarelto 2-3 days preop and then restart postop.

## 2014-12-31 NOTE — Patient Instructions (Addendum)
[name] [name]  12/31/2014   Your procedure is scheduled on:     01/03/2015    Report to [facility] Main  Entrance take [facility]  elevators to 3rd floor to  [place] at    12 noon   Call this number if you have problems the morning of surgery [phone]   Remember: ONLY 1 PERSON MAY GO WITH YOU TO SHORT STAY TO GET  READY MORNING OF YOUR SURGERY.  Do not eat food or drink liquids :After Midnight.  May have clear liquids  from 12 midnite until 0700am morning of surgery then nothing by mouth.      Take these medicines the morning of surgery with A SIP OF WATER:   None                                You may not have any metal on your body including hair pins and              piercings  Do not wear jewelry, make-up, lotions, powders or perfumes, deodorant             Do not wear nail polish.  Do not shave  48 hours prior to surgery.                Do not bring valuables to the hospital. [place].  Contacts, dentures or bridgework may not be worn into surgery.  Leave suitcase in the car. After surgery it may be brought to your room.         Special Instructions: coughing and deep breathing exercises, leg exercises    CLEAR LIQUID DIET   Foods Allowed                                                                     Foods Excluded  Coffee and tea, regular and decaf                             liquids that you cannot  Plain Jell-O in any flavor                                             see through such as: Fruit ices (not with fruit pulp)                                     milk, soups, orange juice  Iced Popsicles                                    All solid food Carbonated beverages, regular and diet  Cranberry, grape and apple juices Sports drinks like Gatorade Lightly seasoned clear broth or consume(fat free) Sugar, honey syrup  Sample Menu Breakfast                                 Lunch                                     Supper Cranberry juice                    Beef broth                            Chicken broth Jell-O                                     Grape juice                           Apple juice Coffee or tea                        Jell-O                                      Popsicle                                                Coffee or tea                        Coffee or tea  _____________________________________________________________________                Please read over the following fact sheets you were given: _____________________________________________________________________             [facility] - Preparing for Surgery Before surgery, you can play an important role.  Because skin is not sterile, your skin needs to be as free of germs as possible.  You can reduce the number of germs on your skin by washing with CHG (chlorahexidine gluconate) soap before surgery.  CHG is an antiseptic cleaner which kills germs and bonds with the skin to continue killing germs even after washing. Please DO NOT use if you have an allergy to CHG or antibacterial soaps.  If your skin becomes reddened/irritated stop using the CHG and inform your nurse when you arrive at Short Stay. Do not shave (including legs and underarms) for at least 48 hours prior to the first CHG shower.  You may shave your face/neck. Please follow these instructions carefully:  1.  Shower with CHG Soap the night before surgery and the  morning of Surgery.  2.  If you choose to wash your hair, wash your hair first as usual with your  normal  shampoo.  3.  After you shampoo, rinse your hair and body thoroughly to remove the  shampoo.  4.  Use CHG as you would any other liquid soap.  You can apply chg directly  to the skin and wash                       Gently with a scrungie or clean washcloth.  5.  Apply the CHG Soap to your body ONLY FROM THE  NECK DOWN.   Do not use on face/ open                           Wound or open sores. Avoid contact with eyes, ears mouth and genitals (private parts).                       Wash face,  Genitals (private parts) with your normal soap.             6.  Wash thoroughly, paying special attention to the area where your surgery  will be performed.  7.  Thoroughly rinse your body with warm water from the neck down.  8.  DO NOT shower/wash with your normal soap after using and rinsing off  the CHG Soap.                9.  Pat yourself dry with a clean towel.            10.  Wear clean pajamas.            11.  Place clean sheets on your bed the night of your first shower and do not  sleep with pets. Day of Surgery : Do not apply any lotions/deodorants the morning of surgery.  Please wear clean clothes to the hospital/surgery center.  FAILURE TO FOLLOW THESE INSTRUCTIONS MAY RESULT IN THE CANCELLATION OF YOUR SURGERY PATIENT SIGNATURE_________________________________  NURSE SIGNATURE__________________________________  ________________________________________________________________________  WHAT IS A BLOOD TRANSFUSION? Blood Transfusion Information  A transfusion is the replacement of blood or some of its parts. Blood is made up of multiple cells which provide different functions.  Red blood cells carry oxygen and are used for blood loss replacement.  White blood cells fight against infection.  Platelets control bleeding.  Plasma helps clot blood.  Other blood products are available for specialized needs, such as hemophilia or other clotting disorders. BEFORE THE TRANSFUSION  Who gives blood for transfusions?   Healthy volunteers who are fully evaluated to make sure their blood is safe. This is blood bank blood. Transfusion therapy is the safest it has ever been in the practice of medicine. Before blood is taken from a donor, a complete history is taken to make sure that person has no history  of diseases nor engages in risky social behavior (examples are intravenous drug use or sexual activity with multiple partners). The donor's travel history is screened to minimize risk of transmitting infections, such as malaria. The donated blood is tested for signs of infectious diseases, such as HIV and hepatitis. The blood is then tested to be sure it is compatible with you in order to minimize the chance of a transfusion reaction. If you or a relative donates blood, this is often done in anticipation of surgery and is not appropriate for emergency situations. It takes many days to process the donated blood. RISKS AND COMPLICATIONS Although transfusion therapy is very safe and saves many lives, the main dangers of transfusion include:   Getting an infectious disease.  Developing a transfusion reaction.  This is an allergic reaction to something in the blood you were given. Every precaution is taken to prevent this. The decision to have a blood transfusion has been considered carefully by your caregiver before blood is given. Blood is not given unless the benefits outweigh the risks. AFTER THE TRANSFUSION  Right after receiving a blood transfusion, you will usually feel much better and more energetic. This is especially true if your red blood cells have gotten low (anemic). The transfusion raises the level of the red blood cells which carry oxygen, and this usually causes an energy increase.  The nurse administering the transfusion will monitor you carefully for complications. HOME CARE INSTRUCTIONS  No special instructions are needed after a transfusion. You may find your energy is better. Speak with your caregiver about any limitations on activity for underlying diseases you may have. SEEK MEDICAL CARE IF:   Your condition is not improving after your transfusion.  You develop redness or irritation at the intravenous (IV) site. SEEK IMMEDIATE MEDICAL CARE IF:  Any of the following symptoms  occur over the next 12 hours:  Shaking chills.  You have a temperature by mouth above 102 F (38.9 C), not controlled by medicine.  Chest, back, or muscle pain.  People around you feel you are not acting correctly or are confused.  Shortness of breath or difficulty breathing.  Dizziness and fainting.  You get a rash or develop hives.  You have a decrease in urine output.  Your urine turns a dark color or changes to pink, red, or brown. Any of the following symptoms occur over the next 10 days:  You have a temperature by mouth above 102 F (38.9 C), not controlled by medicine.  Shortness of breath.  Weakness after normal activity.  The white part of the eye turns yellow (jaundice).  You have a decrease in the amount of urine or are urinating less often.  Your urine turns a dark color or changes to pink, red, or brown. Document Released: 06/18/2000 Document Revised: 09/13/2011 Document Reviewed: 02/05/2008 [facility] Patient Information 2014 [place], [place].  _______________________________________________________________________

## 2015-01-01 ENCOUNTER — Ambulatory Visit (HOSPITAL_COMMUNITY)
Admission: RE | Admit: 2015-01-01 | Discharge: 2015-01-01 | Disposition: A | Discharge status: Home / Self Care | Payer: Medicaid Other | Source: Ambulatory Visit | Attending: Anesthesiology | Admitting: Anesthesiology

## 2015-01-01 ENCOUNTER — Encounter (HOSPITAL_COMMUNITY)
Admission: RE | Admit: 2015-01-01 | Discharge: 2015-01-01 | Disposition: A | Discharge status: Home / Self Care | Payer: Medicaid Other | Source: Ambulatory Visit | Attending: General Surgery | Admitting: General Surgery

## 2015-01-01 ENCOUNTER — Encounter (HOSPITAL_COMMUNITY): Payer: Self-pay

## 2015-01-01 DIAGNOSIS — Z01818 Encounter for other preprocedural examination: Secondary | ICD-10-CM

## 2015-01-01 HISTORY — DX: Other pulmonary embolism without acute cor pulmonale: I26.99

## 2015-01-01 HISTORY — DX: Cardiac murmur, unspecified: R01.1

## 2015-01-01 HISTORY — DX: Anemia, unspecified: D64.9

## 2015-01-01 LAB — COMPREHENSIVE METABOLIC PANEL
ALBUMIN: 3 g/dL — AB (ref 3.5–5.0)
ALK PHOS: 51 U/L (ref 38–126)
ALT: 9 U/L — ABNORMAL LOW (ref 14–54)
AST: 14 U/L — ABNORMAL LOW (ref 15–41)
Anion gap: 7 (ref 5–15)
BUN: 14 mg/dL (ref 6–20)
CO2: 24 mmol/L (ref 22–32)
CREATININE: 0.65 mg/dL (ref 0.44–1.00)
Calcium: 10.9 mg/dL — ABNORMAL HIGH (ref 8.9–10.3)
Chloride: 107 mmol/L (ref 101–111)
GFR calc Af Amer: >60 mL/min (ref >60)
Glucose, Bld: 114 mg/dL — ABNORMAL HIGH (ref 65–99)
Potassium: 5.1 mmol/L (ref 3.5–5.1)
Sodium: 138 mmol/L (ref 135–145)
TOTAL PROTEIN: 7.3 g/dL (ref 6.5–8.1)
Total Bilirubin: 0.2 mg/dL — ABNORMAL LOW (ref 0.3–1.2)

## 2015-01-01 LAB — CBC WITH DIFFERENTIAL/PLATELET
BASOS ABS: 0 10*3/uL (ref 0.0–0.1)
Basophils Relative: 0 % (ref 0–1)
EOS PCT: 2 % (ref 0–5)
Eosinophils Absolute: 0.2 10*3/uL (ref 0.0–0.7)
HCT: 25.1 % — ABNORMAL LOW (ref 36.0–46.0)
Hemoglobin: 7.7 g/dL — ABNORMAL LOW (ref 12.0–15.0)
LYMPHS PCT: 14 % (ref 12–46)
Lymphs Abs: 1.1 10*3/uL (ref 0.7–4.0)
MCH: 24.8 pg — ABNORMAL LOW (ref 26.0–34.0)
MCHC: 30.7 g/dL (ref 30.0–36.0)
MCV: 80.7 fL (ref 78.0–100.0)
Monocytes Absolute: 0.5 10*3/uL (ref 0.1–1.0)
Monocytes Relative: 6 % (ref 3–12)
Neutro Abs: 5.8 10*3/uL (ref 1.7–7.7)
Neutrophils Relative %: 78 % — ABNORMAL HIGH (ref 43–77)
PLATELETS: 472 10*3/uL — AB (ref 150–400)
RBC: 3.11 MIL/uL — ABNORMAL LOW (ref 3.87–5.11)
RDW: 14 % (ref 11.5–15.5)
WBC: 7.5 10*3/uL (ref 4.0–10.5)

## 2015-01-01 LAB — PROTIME-INR
INR: 1.05 (ref 0.00–1.49)
Prothrombin Time: 13.9 seconds (ref 11.6–15.2)

## 2015-01-01 NOTE — Progress Notes (Signed)
CBC/DIFF done 01/01/2015 faxed via EPIC to Dr Zella Richer.

## 2015-01-01 NOTE — Progress Notes (Signed)
Made Anesthesia ( Dr Marcell Barlow) aware of hgb- 7.7 results along with medical history to include recent hx of Pulmonary Embolus on 11/2014  And ekg results from 11/2014 and today's EKG of 01/01/2015.   Also made Anesthesia aware that patient had original heart rate of 114-118 at time of preop appt ( patient stated she was a "little Nervous" ) and at time EKG was done heart rate was 99.No new orders given by anesthesia

## 2015-01-01 NOTE — Progress Notes (Signed)
EKG- 11/14/14 in EPIC  ECHO- 11/14/14 EPIC  Ct angio chest 11/13/2014 EPIC  12/13/2014- LOV with Oncology- Dr Evelena Leyden in [place]

## 2015-01-01 NOTE — Progress Notes (Signed)
Called office of Dr Zella Richer and spoke with Abigail Butts in nursing office.  Abigail Butts stated that [place] in office were aware of CBC results from 01/01/2015 and that Dr Zella Richer would be in office on 01/02/2015  In the afternoon.  Also made Abigail Butts aware that I have faxed a note to Dr Zella Richer via [facility].

## 2015-01-01 NOTE — Progress Notes (Addendum)
Called office of DR Zella Richer and spoke with Nursing OFficeElmo Putt )  and made them aware - hemoglobin of 7.7.  Already sent in EPIC to Dr Zella Richer .  Asked for nurse of Dr Zella Richer Arlyce Dice) to call me back to I know she is aware and to make sure Dr Zella Richer is aware.  Left message with Nursing Office Elmo Putt ) .  They will let nurse Arlyce Dice) of Dr Zella Richer be aware.

## 2015-01-01 NOTE — Progress Notes (Signed)
Pulse- 110-118 at time of preop appointment.  Patient denies any shortness of breath or chest pain.  EKG done.

## 2015-01-01 NOTE — Progress Notes (Signed)
Final EKG done 01/01/15 along with 2V CXR 01/01/2015 in EPIc.

## 2015-01-03 ENCOUNTER — Encounter (HOSPITAL_COMMUNITY)
Admission: RE | Disposition: A | Discharge status: Home Health | Payer: Self-pay | Source: Ambulatory Visit | Attending: General Surgery

## 2015-01-03 ENCOUNTER — Encounter (HOSPITAL_COMMUNITY): Payer: Self-pay | Admitting: *Deleted

## 2015-01-03 ENCOUNTER — Inpatient Hospital Stay (HOSPITAL_COMMUNITY)
Admission: RE | Admit: 2015-01-03 | Discharge: 2015-01-07 | DRG: 330 | Disposition: A | Discharge status: Home Health | Payer: Medicaid Other | Source: Ambulatory Visit | Attending: General Surgery | Admitting: General Surgery

## 2015-01-03 ENCOUNTER — Ambulatory Visit (HOSPITAL_COMMUNITY): Payer: Medicaid Other | Admitting: Anesthesiology

## 2015-01-03 DIAGNOSIS — Y833 Surgical operation with formation of external stoma as the cause of abnormal reaction of the patient, or of later complication, without mention of misadventure at the time of the procedure: Secondary | ICD-10-CM | POA: Diagnosis present

## 2015-01-03 DIAGNOSIS — D649 Anemia, unspecified: Secondary | ICD-10-CM | POA: Diagnosis present

## 2015-01-03 DIAGNOSIS — K9409 Other complications of colostomy: Principal | ICD-10-CM | POA: Diagnosis present

## 2015-01-03 DIAGNOSIS — C187 Malignant neoplasm of sigmoid colon: Secondary | ICD-10-CM | POA: Diagnosis present

## 2015-01-03 DIAGNOSIS — Z87891 Personal history of nicotine dependence: Secondary | ICD-10-CM

## 2015-01-03 DIAGNOSIS — Z800 Family history of malignant neoplasm of digestive organs: Secondary | ICD-10-CM | POA: Diagnosis not present

## 2015-01-03 DIAGNOSIS — Z808 Family history of malignant neoplasm of other organs or systems: Secondary | ICD-10-CM

## 2015-01-03 DIAGNOSIS — Z7901 Long term (current) use of anticoagulants: Secondary | ICD-10-CM | POA: Diagnosis not present

## 2015-01-03 DIAGNOSIS — Z90710 Acquired absence of both cervix and uterus: Secondary | ICD-10-CM

## 2015-01-03 DIAGNOSIS — Z86711 Personal history of pulmonary embolism: Secondary | ICD-10-CM | POA: Diagnosis not present

## 2015-01-03 HISTORY — PX: COLOSTOMY REVISION: SHX5232

## 2015-01-03 LAB — PREPARE RBC (CROSSMATCH)

## 2015-01-03 SURGERY — REVISION, COLOSTOMY
Anesthesia: General | Site: Abdomen

## 2015-01-03 MED ORDER — CETYLPYRIDINIUM CHLORIDE 0.05 % MT LIQD
7.0000 mL | Freq: Two times a day (BID) | OROMUCOSAL | Status: DC
  Administered 2015-01-04 – 2015-01-06 (×3): 7 mL via OROMUCOSAL

## 2015-01-03 MED ORDER — HYDROMORPHONE HCL 1 MG/ML IJ SOLN
0.2500 mg | INTRAMUSCULAR | Status: DC | PRN
  Administered 2015-01-03 (×2): 0.5 mg via INTRAVENOUS

## 2015-01-03 MED ORDER — 0.9 % SODIUM CHLORIDE (POUR BTL) OPTIME
TOPICAL | Status: DC | PRN
  Administered 2015-01-03: 2000 mL

## 2015-01-03 MED ORDER — SUCCINYLCHOLINE CHLORIDE 20 MG/ML IJ SOLN
INTRAMUSCULAR | Status: DC | PRN
  Administered 2015-01-03: 90 mg via INTRAVENOUS

## 2015-01-03 MED ORDER — CEFOTETAN DISODIUM-DEXTROSE 2-2.08 GM-% IV SOLR: 2.0000 g | Freq: Two times a day (BID) | INTRAVENOUS | Status: DC

## 2015-01-03 MED ORDER — PROPOFOL 10 MG/ML IV BOLUS
INTRAVENOUS | Status: DC | PRN
  Administered 2015-01-03: 100 mg via INTRAVENOUS

## 2015-01-03 MED ORDER — GLYCOPYRROLATE 0.2 MG/ML IJ SOLN
INTRAMUSCULAR | Status: DC | PRN
  Administered 2015-01-03: .6 mg via INTRAVENOUS

## 2015-01-03 MED ORDER — FENTANYL CITRATE (PF) 250 MCG/5ML IJ SOLN
INTRAMUSCULAR | Status: AC
  Filled 2015-01-03: qty 5

## 2015-01-03 MED ORDER — HYDROMORPHONE HCL 1 MG/ML IJ SOLN
0.5000 mg | INTRAMUSCULAR | Status: DC | PRN
  Administered 2015-01-03 – 2015-01-06 (×19): 1 mg via INTRAVENOUS
  Filled 2015-01-03 (×21): qty 1

## 2015-01-03 MED ORDER — PROMETHAZINE HCL 25 MG/ML IJ SOLN: 6.2500 mg | INTRAMUSCULAR | Status: DC | PRN

## 2015-01-03 MED ORDER — LIDOCAINE HCL (CARDIAC) 20 MG/ML IV SOLN
INTRAVENOUS | Status: AC
  Filled 2015-01-03: qty 5

## 2015-01-03 MED ORDER — DEXAMETHASONE SODIUM PHOSPHATE 10 MG/ML IJ SOLN
INTRAMUSCULAR | Status: AC
  Filled 2015-01-03: qty 1

## 2015-01-03 MED ORDER — NEOSTIGMINE METHYLSULFATE 10 MG/10ML IV SOLN
INTRAVENOUS | Status: DC | PRN
  Administered 2015-01-03: 4 mg via INTRAVENOUS

## 2015-01-03 MED ORDER — LIDOCAINE HCL (CARDIAC) 20 MG/ML IV SOLN
INTRAVENOUS | Status: DC | PRN
  Administered 2015-01-03: 50 mg via INTRAVENOUS

## 2015-01-03 MED ORDER — SODIUM CHLORIDE 0.9 % IV SOLN
2.0000 g | INTRAVENOUS | Status: DC | PRN
  Administered 2015-01-03: 2 g via INTRAVENOUS

## 2015-01-03 MED ORDER — PROCHLORPERAZINE EDISYLATE 5 MG/ML IJ SOLN: 10.0000 mg | Freq: Four times a day (QID) | INTRAMUSCULAR | Status: DC | PRN

## 2015-01-03 MED ORDER — DIPHENHYDRAMINE HCL 50 MG/ML IJ SOLN
INTRAMUSCULAR | Status: DC | PRN
  Administered 2015-01-03: 25 mg via INTRAVENOUS

## 2015-01-03 MED ORDER — ONDANSETRON HCL 4 MG/2ML IJ SOLN
INTRAMUSCULAR | Status: DC | PRN
  Administered 2015-01-03 (×2): 2 mg via INTRAVENOUS

## 2015-01-03 MED ORDER — LIDOCAINE-PRILOCAINE 2.5-2.5 % EX CREA: 1.0000 "application " | TOPICAL_CREAM | Freq: Once | CUTANEOUS | Status: DC

## 2015-01-03 MED ORDER — FENTANYL CITRATE (PF) 100 MCG/2ML IJ SOLN
INTRAMUSCULAR | Status: DC | PRN
  Administered 2015-01-03: 50 ug via INTRAVENOUS
  Administered 2015-01-03: 25 ug via INTRAVENOUS
  Administered 2015-01-03 (×2): 50 ug via INTRAVENOUS
  Administered 2015-01-03: 25 ug via INTRAVENOUS
  Administered 2015-01-03: 50 ug via INTRAVENOUS

## 2015-01-03 MED ORDER — KCL IN DEXTROSE-NACL 20-5-0.9 MEQ/L-%-% IV SOLN
INTRAVENOUS | Status: DC
  Administered 2015-01-03: 19:00:00 via INTRAVENOUS
  Administered 2015-01-04 – 2015-01-05 (×3): 100 mL/h via INTRAVENOUS
  Administered 2015-01-05: via INTRAVENOUS
  Administered 2015-01-05: 100 mL/h via INTRAVENOUS
  Administered 2015-01-06: 17:00:00 via INTRAVENOUS
  Filled 2015-01-03 (×11): qty 1000

## 2015-01-03 MED ORDER — PROPOFOL 10 MG/ML IV BOLUS
INTRAVENOUS | Status: AC
  Filled 2015-01-03: qty 20

## 2015-01-03 MED ORDER — GLYCOPYRROLATE 0.2 MG/ML IJ SOLN
INTRAMUSCULAR | Status: AC
  Filled 2015-01-03: qty 3

## 2015-01-03 MED ORDER — DEXTROSE 5 % IV SOLN
1.0000 g | Freq: Once | INTRAVENOUS | Status: AC
  Administered 2015-01-04: 1 g via INTRAVENOUS
  Filled 2015-01-03: qty 1

## 2015-01-03 MED ORDER — BUPIVACAINE HCL (PF) 0.5 % IJ SOLN
INTRAMUSCULAR | Status: AC
  Filled 2015-01-03: qty 30

## 2015-01-03 MED ORDER — ENOXAPARIN SODIUM 40 MG/0.4ML ~~LOC~~ SOLN
40.0000 mg | SUBCUTANEOUS | Status: DC
  Administered 2015-01-04 – 2015-01-06 (×3): 40 mg via SUBCUTANEOUS
  Filled 2015-01-03 (×4): qty 0.4

## 2015-01-03 MED ORDER — SODIUM CHLORIDE 0.9 % IV SOLN
Freq: Once | INTRAVENOUS | Status: AC
  Administered 2015-01-03 (×2): via INTRAVENOUS

## 2015-01-03 MED ORDER — NEOSTIGMINE METHYLSULFATE 10 MG/10ML IV SOLN
INTRAVENOUS | Status: AC
  Filled 2015-01-03: qty 1

## 2015-01-03 MED ORDER — LACTATED RINGERS IV SOLN
INTRAVENOUS | Status: DC
  Administered 2015-01-03: 1000 mL via INTRAVENOUS

## 2015-01-03 MED ORDER — DEXAMETHASONE SODIUM PHOSPHATE 10 MG/ML IJ SOLN
INTRAMUSCULAR | Status: DC | PRN
  Administered 2015-01-03: 10 mg via INTRAVENOUS

## 2015-01-03 MED ORDER — ONDANSETRON HCL 4 MG/2ML IJ SOLN
INTRAMUSCULAR | Status: AC
  Filled 2015-01-03: qty 2

## 2015-01-03 MED ORDER — MIDAZOLAM HCL 2 MG/2ML IJ SOLN
INTRAMUSCULAR | Status: AC
  Filled 2015-01-03: qty 2

## 2015-01-03 MED ORDER — MIDAZOLAM HCL 5 MG/5ML IJ SOLN
INTRAMUSCULAR | Status: DC | PRN
  Administered 2015-01-03 (×2): 0.5 mg via INTRAVENOUS

## 2015-01-03 MED ORDER — HYDROMORPHONE HCL 1 MG/ML IJ SOLN
INTRAMUSCULAR | Status: AC
  Filled 2015-01-03: qty 1

## 2015-01-03 MED ORDER — ROCURONIUM BROMIDE 100 MG/10ML IV SOLN
INTRAVENOUS | Status: AC
  Filled 2015-01-03: qty 1

## 2015-01-03 MED ORDER — ROCURONIUM BROMIDE 100 MG/10ML IV SOLN
INTRAVENOUS | Status: DC | PRN
  Administered 2015-01-03: 10 mg via INTRAVENOUS
  Administered 2015-01-03: 30 mg via INTRAVENOUS

## 2015-01-03 MED ORDER — CHLORHEXIDINE GLUCONATE 0.12 % MT SOLN
15.0000 mL | Freq: Two times a day (BID) | OROMUCOSAL | Status: DC
  Administered 2015-01-03 – 2015-01-06 (×6): 15 mL via OROMUCOSAL
  Filled 2015-01-03 (×10): qty 15

## 2015-01-03 MED ORDER — LORAZEPAM 0.5 MG PO TABS: 0.5000 mg | ORAL_TABLET | Freq: Three times a day (TID) | ORAL | Status: DC | PRN

## 2015-01-03 MED ORDER — LACTATED RINGERS IV SOLN
INTRAVENOUS | Status: DC | PRN
  Administered 2015-01-03 (×2): via INTRAVENOUS

## 2015-01-03 MED ORDER — CEFOTETAN DISODIUM-DEXTROSE 2-2.08 GM-% IV SOLR
INTRAVENOUS | Status: AC
  Filled 2015-01-03: qty 50

## 2015-01-03 SURGICAL SUPPLY — 47 items
APPLICATOR COTTON TIP 6IN STRL (MISCELLANEOUS) ×2 IMPLANT
BLADE EXTENDED COATED 6.5IN (ELECTRODE) IMPLANT
BLADE HEX COATED 2.75 (ELECTRODE) ×3 IMPLANT
BLADE SURG SZ10 CARB STEEL (BLADE) ×3 IMPLANT
CLAMP POUCH DRAINAGE QUIET (OSTOMY) ×2 IMPLANT
CLIP TI LARGE 6 (CLIP) IMPLANT
COVER MAYO STAND STRL (DRAPES) ×3 IMPLANT
DRAPE LAPAROSCOPIC ABDOMINAL (DRAPES) ×3 IMPLANT
DRAPE SHEET LG 3/4 BI-LAMINATE (DRAPES) IMPLANT
DRAPE WARM FLUID 44X44 (DRAPE) ×3 IMPLANT
ELECT REM PT RETURN 9FT ADLT (ELECTROSURGICAL) ×3
ELECTRODE REM PT RTRN 9FT ADLT (ELECTROSURGICAL) ×1 IMPLANT
GAUZE SPONGE 4X4 12PLY STRL (GAUZE/BANDAGES/DRESSINGS) ×1 IMPLANT
GLOVE BIOGEL PI IND STRL 7.0 (GLOVE) ×1 IMPLANT
GLOVE BIOGEL PI INDICATOR 7.0 (GLOVE) ×8
GOWN STRL REUS W/TWL LRG LVL3 (GOWN DISPOSABLE) ×1 IMPLANT
GOWN STRL REUS W/TWL XL LVL3 (GOWN DISPOSABLE) ×10 IMPLANT
HOLDER FOLEY CATH W/STRAP (MISCELLANEOUS) ×2 IMPLANT
KIT BASIN OR (CUSTOM PROCEDURE TRAY) ×3 IMPLANT
LEGGING LITHOTOMY PAIR STRL (DRAPES) ×1 IMPLANT
LIGASURE IMPACT 36 18CM CVD LR (INSTRUMENTS) ×2 IMPLANT
NS IRRIG 1000ML POUR BTL (IV SOLUTION) ×6 IMPLANT
PACK GENERAL/GYN (CUSTOM PROCEDURE TRAY) ×3 IMPLANT
POUCH OSTOMY 1 PC DRNBL  2 1/2 (OSTOMY) IMPLANT
POUCH OSTOMY 2 1/2 (OSTOMY) ×3
SHEARS HARMONIC ACE PLUS 36CM (ENDOMECHANICALS) IMPLANT
STAPLER VISISTAT 35W (STAPLE) ×1 IMPLANT
SUCTION POOLE TIP (SUCTIONS) ×3 IMPLANT
SUT NOV 1 T60/GS (SUTURE) IMPLANT
SUT NOVA T20/GS 25 (SUTURE) ×2 IMPLANT
SUT PDS AB 1 CTX 36 (SUTURE) ×4 IMPLANT
SUT PDS AB 1 TP1 54 (SUTURE) IMPLANT
SUT PROLENE 2 0 KS (SUTURE) IMPLANT
SUT SILK 2 0 (SUTURE) ×3
SUT SILK 2 0 SH CR/8 (SUTURE) ×3 IMPLANT
SUT SILK 2 0SH CR/8 30 (SUTURE) IMPLANT
SUT SILK 2-0 18XBRD TIE 12 (SUTURE) ×1 IMPLANT
SUT SILK 2-0 30XBRD TIE 12 (SUTURE) IMPLANT
SUT SILK 3 0 (SUTURE) ×3
SUT SILK 3 0 SH CR/8 (SUTURE) ×3 IMPLANT
SUT SILK 3-0 18XBRD TIE 12 (SUTURE) ×1 IMPLANT
SUT VIC AB 2-0 SH 18 (SUTURE) ×4 IMPLANT
SUT VIC AB 3-0 SH 18 (SUTURE) ×4 IMPLANT
TOWEL OR 17X26 10 PK STRL BLUE (TOWEL DISPOSABLE) ×6 IMPLANT
TRAY FOLEY W/METER SILVER 14FR (SET/KITS/TRAYS/PACK) ×3 IMPLANT
WATER STERILE IRR 1500ML POUR (IV SOLUTION) IMPLANT
YANKAUER SUCT BULB TIP NO VENT (SUCTIONS) ×1 IMPLANT

## 2015-01-03 NOTE — Op Note (Signed)
Operative Note  [name] [name] female 64 y.o. 01/03/2015  PREOPERATIVE DX:  Prolapsed transverse loop colostomy  POSTOPERATIVE DX:  Same  PROCEDURE:   Revision of loop colostomy with partial colectomy         Surgeon: [name]   Assistants: Leighton Ruff M.D.  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 She has developed significant prolapse especially of the efferent limb which is interfering with her quality of life. We discussed revision and she elected to proceed.    Procedure Detail:  She was brought to the operating room placed supine on the operating table and a general anesthetic was given. A Foley catheter and oral gastric tube were inserted. The bowel wall was sterilely prepped and draped. The prolapsed portion of the loop colostomy was held up during the prep and it was sprayed with Betadine and placed on dry towels.  Beginning approximately 8 cm above the skin level and incision is made in the mucosa and submucosa of the prolapsed efferent limb segment circumferentially.  Omental adhesions were then freed up. Once I then this circumferentially, I could see the prolapsed segment. I subsequently divided normal-appearing transverse closure close to the skin between bowel clamp and a Kocher clamp. I then freed up the mucocutaneous junction of the entire loop colostomy including the afferent limb. The loop colostomy was then completely mobilized from the subcutaneous tissue and fascia. The afferent limb was quite mobile and the fascia was weak. I resected some of the omentum that was prolapsing as well.  I brought out some of the proximal transverse colon and hepatic flexure that was very mobile. I then divided this between bowel clamps. The mesentery of this section was resected with the LigaSure. The remaining prolapsed efferent  limb was then removed.  The lateral fascia was then partially closed with interrupted #1 PDS sutures. The afferent limb was then matured in a [place] type fashion with interrupted 3-0 Vicryl sutures. The efferent limb was then sewn to the skin with interrupted 3-0 Vicryl sutures.  The common walls were then sewn together with interrupted 3-0 Vicryl suture. Both lumens were patent. A colostomy appliance was applied.  She tolerated the procedure well without any apparent complications and was taken to the recovery room in satisfactory condition.  Estimated Blood Loss:  200 mL         Blood Given: 2 units  PRBC          Specimens: Part of transverse        Complications:  * No complications entered in OR log *         Disposition: PACU - hemodynamically stable.         Condition: stable

## 2015-01-03 NOTE — Interval H&P Note (Signed)
History and Physical Interval Note:  01/03/2015 2:20 PM  [name] [name]  has presented today for surgery, with the diagnosis of PROLAPSED LOOP COLOSTOMY  The various methods of treatment have been discussed with the patient and family. After consideration of risks, benefits and other options for treatment, the patient has consented to  Procedure(s): LOOP COLOSTOMY REVISION (N/A) as a surgical intervention .  The patient's history has been reviewed, patient examined, no change in status, stable for surgery.  I have reviewed the patient's chart and labs.  Questions were answered to the patient's satisfaction.     Reana Chacko [name]

## 2015-01-03 NOTE — H&P (View-Only) (Signed)
Spoke with Dr. Burr Medico regarding the [place].  Ms. Stemmler does not need Lovenox bridge.  Will hold Xarelto 2-3 days preop and then restart postop.

## 2015-01-03 NOTE — Anesthesia Postprocedure Evaluation (Signed)
  Anesthesia Post-op Note  Patient: [name] [name]  Procedure(s) Performed: Procedure(s) (LRB): REVISION OF TRANSVERSE LOOP COLOSTOMY WITH PARTIAL  COLECTOMY (N/A)  Patient Location: PACU  Anesthesia Type: General  Level of Consciousness: awake and alert   Airway and Oxygen Therapy: Patient Spontanous Breathing  Post-op Pain: mild  Post-op Assessment: Post-op Vital signs reviewed, Patient's Cardiovascular Status Stable, Respiratory Function Stable, Patent Airway and No signs of Nausea or vomiting  Last Vitals:  Filed Vitals:   01/03/15 1715  BP: 124/58  Pulse: 81  Temp:   Resp: 13    Post-op Vital Signs: stable   Complications: No apparent anesthesia complications

## 2015-01-03 NOTE — Anesthesia Preprocedure Evaluation (Signed)
Anesthesia Evaluation  Patient identified by MRN, date of birth, ID band Patient awake    Reviewed: Allergy & Precautions, NPO status , Patient's Chart, lab work & pertinent test results  Airway Mallampati: II  TM Distance: >3 FB Neck ROM: Full    Dental no notable dental hx.    Pulmonary former smoker, PE breath sounds clear to auscultation  Pulmonary exam normal       Cardiovascular negative cardio ROS Normal cardiovascular examRhythm:Regular Rate:Normal     Neuro/Psych negative neurological ROS  negative psych ROS   GI/Hepatic negative GI ROS, Neg liver ROS,   Endo/Other  negative endocrine ROS  Renal/GU negative Renal ROS  negative genitourinary   Musculoskeletal negative musculoskeletal ROS (+)   Abdominal   Peds negative pediatric ROS (+)  Hematology  (+) anemia ,   Anesthesia Other Findings   Reproductive/Obstetrics negative OB ROS                             Anesthesia Physical Anesthesia Plan  ASA: III  Anesthesia Plan: General   Post-op Pain Management:    Induction: Intravenous  Airway Management Planned: Oral ETT  Additional Equipment:   Intra-op Plan:   Post-operative Plan: Extubation in OR  Informed Consent: I have reviewed the patients History and Physical, chart, labs and discussed the procedure including the risks, benefits and alternatives for the proposed anesthesia with the patient or authorized representative who has indicated his/her understanding and acceptance.   Dental advisory given  Plan Discussed with: CRNA and Surgeon  Anesthesia Plan Comments:         Anesthesia Quick Evaluation

## 2015-01-03 NOTE — Discharge Instructions (Addendum)
No lifting, pushing, or pulling over 10 pounds.  Resume your home medications and usual diet.  Return visit with Dr. Zella Richer in about 2-3 weeks. Please call to make this appointment.  Call for high fever (greater than 101.5), colostomy problems, persistent vomiting.

## 2015-01-03 NOTE — Interval H&P Note (Signed)
History and Physical Interval Note:  01/03/2015 2:35 PM  [name] [name]  has presented today for surgery, with the diagnosis of PROLAPSED LOOP COLOSTOMY  The various methods of treatment have been discussed with the patient and family. After consideration of risks, benefits and other options for treatment, the patient has consented to  Procedure(s): LOOP COLOSTOMY REVISION (N/A) as a surgical intervention .  The patient's history has been reviewed, patient examined, no change in status, stable for surgery.  I have reviewed the patient's chart and labs.  Questions were answered to the patient's satisfaction.     Shabana Armentrout [name]

## 2015-01-03 NOTE — H&P (Signed)
[name] [name] is an 64 y.o. female.   Chief Complaint:   Here for transverse loop colostomy revision HPI:   She has stage IV obstructing sigmoid colon cancer and is s/p palliative transverse loop colostomy  10/28/14.  She has developed a significant prolapse of the efferent limb that is affecting the quality of her life.  Xarelto has been on hold.  Past Medical History  Diagnosis Date  . Colon cancer     dx'd 2014  . Heart murmur     ? as a child   . Pulmonary embolism 11/2014   . Anemia     hx of     Past Surgical History  Procedure Laterality Date  . Abdominal hysterectomy  2005  . Cesarean section  1976  . Flexible sigmoidoscopy N/A 05/25/2013    Procedure: FLEXIBLE SIGMOIDOSCOPY;  Surgeon: [name], MD;  Location: [facility];  Service: Endoscopy;  Laterality: N/A;  needs floro  . Colonic stent placement N/A 05/25/2013    Procedure: COLONIC STENT PLACEMENT;  Surgeon: [name], MD;  Location: [facility];  Service: Endoscopy;  Laterality: N/A;  . Portacath placement Right 06/25/2013    Procedure: ULTRA SOUND GUIDED INSERTION PORT-A-CATH;  Surgeon: [name], MD;  Location: [facility];  Service: General;  Laterality: Right;  . Flexible sigmoidoscopy N/A 08/19/2014    Procedure: FLEXIBLE SIGMOIDOSCOPY;  Surgeon: Gatha Mayer, MD;  Location: [facility];  Service: Endoscopy;  Laterality: N/A;  . Colonic stent placement N/A 08/19/2014    Procedure: COLONIC STENT PLACEMENT;  Surgeon: Gatha Mayer, MD;  Location: [facility];  Service: Endoscopy;  Laterality: N/A;  . Flexible sigmoidoscopy N/A 08/19/2014    Procedure: FLEXIBLE SIGMOIDOSCOPY;  Surgeon: Gatha Mayer, MD;  Location: [facility];  Service: Endoscopy;  Laterality: N/A;  . Colonic stent placement N/A 08/19/2014    Procedure: COLONIC STENT PLACEMENT;  Surgeon: Gatha Mayer, MD;  Location: [facility];  Service: Endoscopy;  Laterality: N/A;  . Colon resection N/A 10/28/2014     Procedure: Transverse loop colostomy;  Surgeon: [name], MD;  Location: [facility];  Service: General;  Laterality: N/A;    Family History  Problem Relation Age of Onset  . Colon cancer Sister 64  . Diabetes Neg Hx   . Thyroid disease Neg Hx   . Breast cancer Maternal Aunt   . Prostate cancer Maternal Uncle   . Bone cancer Maternal Grandfather   . Prostate cancer Maternal Uncle    Social History:  reports that she quit smoking about 43 years ago. Her smoking use included Cigarettes. She quit after 1 year of use. She has never used smokeless tobacco. She reports that she does not drink alcohol or use illicit drugs.  Allergies:  Allergies  Allergen Reactions  . Oxycontin [Oxycodone Hcl] Anaphylaxis, Hives and Itching  . Codeine Itching and Nausea And Vomiting    Medications Prior to Admission  Medication Sig Dispense Refill  . acetaminophen (TYLENOL) 500 MG tablet Take 500 mg by mouth every 6 (six) hours as needed for moderate pain or headache.     . Cyanocobalamin (VITAMIN B 12 PO) Take 1 tablet by mouth every morning.    . IRON PO Take 1 tablet by mouth every morning.    . lidocaine-prilocaine (EMLA) cream Apply 1 application topically as needed. Apply to port site one hour before treatment and cover with plastic wrap. 30 g 3  . ondansetron (ZOFRAN) 8 MG tablet Take  8mg  by mouth every 12 hours as needed for nausea/vomiting.  Take 8 mg by mouth  30 minutes before taking chemo pills, and as needed for nausea/vomiting. 30 tablet 1  . polyethylene glycol powder (GLYCOLAX/MIRALAX) powder TAKE 34 G (2 DOSES) BY MOUTH DAILY AS NEEDED FOR MILD CONSTIPATION OR MODERATE CONSTIPATION (Patient taking differently: TAKE 1-2 DOSES BY MOUTH DAILY AS NEEDED FOR MILD CONSTIPATION OR MODERATE CONSTIPATION) 255 g 0  . rivaroxaban (XARELTO) 20 MG TABS tablet Take 1 tablet (20 mg total) by mouth daily with supper. 30 tablet 3  . HYDROmorphone (DILAUDID) 4 MG tablet Take 0.5 tablets (2 mg total) by mouth  every 6 (six) hours as needed for severe pain. (Patient not taking: Reported on 12/13/2014) 30 tablet 0  . LORazepam (ATIVAN) 0.5 MG tablet Take 1 tablet (0.5 mg total) by mouth every 8 (eight) hours as needed (nausea/vomiting). (Patient not taking: Reported on 12/13/2014) 60 tablet 0  . prochlorperazine (COMPAZINE) 10 MG tablet TAKE 1 TABLET (10 MG TOTAL) BY MOUTH EVERY 6 (SIX) HOURS AS NEEDED (NAUSEA OR VOMITING). (Patient not taking: Reported on 12/13/2014) 30 tablet 1    No results found for this or any previous visit (from the past 48 hour(s)). No results found.  Review of Systems  Constitutional:       Anxious  Gastrointestinal:       Passing clots occasionally per rectum.    Blood pressure 117/56, pulse 109, temperature 98.3 F (36.8 C), temperature source Oral, resp. rate 18, height 5\' 7"  (1.702 m), weight 80.287 kg (177 lb), SpO2 100 %. Physical Exam  Constitutional:  Thin female, anxious.  Cardiovascular:  Increase rate.  Respiratory: Effort normal and breath sounds normal.  GI: Soft.  Lower midline scar.  RUQ loop colostomy with significant prolapse of efferent limb.     Assessment/Plan Significant loop colostomy prolapse that is difficult to take care of and is affecting the quality of her life. This prolapse occurred after she had some significant nausea and vomiting soon after the palliative loop colostomy was done.  Plan: We discussed options of doing nothing or revising the distal limp of the loop colostomy. She would like to proceed with revision as she wants to have a good quality of life. She understands this may delay the start of her chemotherapy. Thus, will plan on loop colostomy revision. We discussed the procedure and risks including but not limited to bleeding, infection, recurrent prolapse, anesthesia. She seems to understand all of this and wants to proceed.  Bowe Sidor J 01/03/2015, 2:21 PM

## 2015-01-03 NOTE — Anesthesia Procedure Notes (Signed)
Procedure Name: Intubation Date/Time: 01/03/2015 3:07 PM Performed by: Ofilia Neas Pre-anesthesia Checklist: Patient identified, Timeout performed, Emergency Drugs available, Suction available and Patient being monitored Patient Re-evaluated:Patient Re-evaluated prior to inductionOxygen Delivery Method: Circle system utilized Preoxygenation: Pre-oxygenation with 100% oxygen Intubation Type: IV induction, Cricoid Pressure applied and Rapid sequence Laryngoscope Size: Mac and 4 Grade View: Grade II Tube type: Oral Tube size: 7.5 mm Number of attempts: 1 Airway Equipment and Method: Stylet Placement Confirmation: ETT inserted through vocal cords under direct vision,  positive ETCO2 and breath sounds checked- equal and bilateral Secured at: 21 cm Tube secured with: Tape Dental Injury: Teeth and Oropharynx as per pre-operative assessment  Comments: Right front tooth still loose, intact, no change.

## 2015-01-03 NOTE — Transfer of Care (Signed)
Immediate Anesthesia Transfer of Care Note  Patient: [name] [name]  Procedure(s) Performed: Procedure(s): REVISION OF TRANSVERSE LOOP COLOSTOMY WITH PARTIAL  COLECTOMY (N/A)  Patient Location: PACU  Anesthesia Type:General  Level of Consciousness: awake, oriented, patient cooperative, lethargic and responds to stimulation  Airway & Oxygen Therapy: Patient Spontanous Breathing and Patient connected to face mask oxygen  Post-op Assessment: Report given to RN, Post -op Vital signs reviewed and stable and Patient moving all extremities  Post vital signs: Reviewed and stable  Last Vitals:  Filed Vitals:   01/03/15 1647  BP: 128/52  Pulse: 84  Temp: 37.4 C  Resp: 17    Complications: No apparent anesthesia complications

## 2015-01-04 ENCOUNTER — Encounter (HOSPITAL_COMMUNITY): Payer: Self-pay | Admitting: *Deleted

## 2015-01-04 LAB — TYPE AND SCREEN
ABO/RH(D): AB POS
Antibody Screen: NEGATIVE
Unit division: 0
Unit division: 0

## 2015-01-04 LAB — CBC
HCT: 29.1 % — ABNORMAL LOW (ref 36.0–46.0)
Hemoglobin: 8.9 g/dL — ABNORMAL LOW (ref 12.0–15.0)
MCH: 23.8 pg — ABNORMAL LOW (ref 26.0–34.0)
MCHC: 30.6 g/dL (ref 30.0–36.0)
MCV: 77.8 fL — ABNORMAL LOW (ref 78.0–100.0)
Platelets: 358 10*3/uL (ref 150–400)
RBC: 3.74 MIL/uL — ABNORMAL LOW (ref 3.87–5.11)
RDW: 14.1 % (ref 11.5–15.5)
WBC: 10.1 10*3/uL (ref 4.0–10.5)

## 2015-01-04 LAB — BASIC METABOLIC PANEL
Anion gap: 6 (ref 5–15)
BUN: 6 mg/dL (ref 6–20)
CHLORIDE: 108 mmol/L (ref 101–111)
CO2: 23 mmol/L (ref 22–32)
Calcium: 9.3 mg/dL (ref 8.9–10.3)
Creatinine, Ser: 0.63 mg/dL (ref 0.44–1.00)
GFR calc non Af Amer: >60 mL/min (ref >60)
GLUCOSE: 114 mg/dL — AB (ref 65–99)
Potassium: 4.1 mmol/L (ref 3.5–5.1)
Sodium: 137 mmol/L (ref 135–145)

## 2015-01-04 MED ORDER — DIPHENHYDRAMINE HCL 50 MG/ML IJ SOLN
12.5000 mg | Freq: Four times a day (QID) | INTRAMUSCULAR | Status: DC | PRN
  Administered 2015-01-04: 12.5 mg via INTRAVENOUS
  Filled 2015-01-04: qty 1

## 2015-01-04 NOTE — Progress Notes (Signed)
Patient ID: [name] [name], female   DOB: 1950/11/06, 64 y.o.   MRN: [id number] [facility] Progress Note:   1 Day Post-Op  Subjective: Mental status is clear.  No complaints.  Tolerating clear liquid diet.   Objective: Vital signs in last 24 hours: Temp:  [97.9 F (36.6 C)-99.3 F (37.4 C)] 97.9 F (36.6 C) (07/02 0525) Pulse Rate:  [70-109] 88 (07/02 0525) Resp:  [11-18] 14 (07/02 0525) BP: (106-130)/(52-64) 106/57 mmHg (07/02 0525) SpO2:  [99 %-100 %] 99 % (07/02 0525) Weight:  [80.287 kg (177 lb)] 80.287 kg (177 lb) (07/01 1210)  Intake/Output from previous day: 07/01 0701 - 07/02 0700 In: 3335 [I.V.:3000; Blood:335] Out: 1630 [Urine:1575; Stool:30; Blood:25] Intake/Output this shift:    Physical Exam: Work of breathing is normal.  Patient reports that she is feeling much better.    Lab Results:  Results for orders placed or performed during the hospital encounter of 01/03/15 (from the past 48 hour(s))  Prepare RBC     Status: None   Collection Time: 01/03/15  2:20 PM  Result Value Ref Range   Order Confirmation ORDER PROCESSED BY BLOOD BANK   Basic metabolic panel     Status: Abnormal   Collection Time: 01/04/15  5:22 AM  Result Value Ref Range   Sodium 137 135 - 145 mmol/L   Potassium 4.1 3.5 - 5.1 mmol/L   Chloride 108 101 - 111 mmol/L   CO2 23 22 - 32 mmol/L   Glucose, Bld 114 (H) 65 - 99 mg/dL   BUN 6 6 - 20 mg/dL   Creatinine, Ser 0.63 0.44 - 1.00 mg/dL   Calcium 9.3 8.9 - 10.3 mg/dL   GFR calc non Af Amer >60 >60 mL/min   GFR calc Af Amer >60 >60 mL/min    Comment: (NOTE) The eGFR has been calculated using the CKD EPI equation. This calculation has not been validated in all clinical situations. eGFR's persistently <60 mL/min signify possible Chronic Kidney Disease.    Anion gap 6 5 - 15  CBC     Status: Abnormal   Collection Time: 01/04/15  5:22 AM  Result Value Ref Range   WBC 10.1 4.0 - 10.5 K/uL   RBC 3.74 (L) 3.87 - 5.11 MIL/uL   Hemoglobin 8.9 (L) 12.0 - 15.0 g/dL   HCT 29.1 (L) 36.0 - 46.0 %   MCV 77.8 (L) 78.0 - 100.0 fL   MCH 23.8 (L) 26.0 - 34.0 pg   MCHC 30.6 30.0 - 36.0 g/dL   RDW 14.1 11.5 - 15.5 %   Platelets 358 150 - 400 K/uL    Radiology/Results: No results found.  Anti-infectives: Anti-infectives    Start     Dose/Rate Route Frequency Ordered Stop   01/04/15 0300  cefoTEtan (CEFOTAN) 1 g in dextrose 5 % 50 mL IVPB     1 g 100 mL/hr over 30 Minutes Intravenous  Once 01/03/15 1759 01/04/15 0239   01/03/15 1147  cefoTEtan in Dextrose 5% (CEFOTAN) IVPB 2 g  Status:  Discontinued     2 g Intravenous Every 12 hours 01/03/15 1147 01/03/15 1745      Assessment/Plan: Problem List: Patient Active Problem List   Diagnosis Date Noted  . Pulmonary embolism 11/14/2014  . 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 Anxiety   . Upper abdominal pain   . Bowel obstruction 08/17/2014  . Nausea with vomiting 06/12/2014  . 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 Colorectal cancer, stage IV, obstructing s/p  colonic diversion 06/05/2013  . Family history of colorectal cancer 06/05/2013  . Thrush, oral 06/05/2013  . Tachycardia 06/05/2013  . Colon obstruction 05/24/2013  . Hypercalcemia 05/24/2013  . Rectal mass 05/23/2013    Advance to full liquid diet 1 Day Post-Op    LOS: 1 day   Matt B. Hassell Done, MD, [facility] [phone] beeper [phone]  01/04/2015 9:08 AM

## 2015-01-04 NOTE — Consult Note (Addendum)
WOC ostomy consult note Stoma type/location: RLQ loop transverse colostomy Stomal assessment/size: Oval stoma, 1 and 1/4 inch x 2 and 1/4 inches, edematous, slightly raised. May be flush after edema subsides Peristomal assessment: intact, with evidence of scarring, depressed/defect area at lateral side (9 o'clock) Treatment options for stomal/peristomal skin: skin barrier ring Output: thin dark brown effluent Ostomy pouching: 1pc.convex pouch with skin barrier ring Education provided: patient is thrilled with smaller pouching sytem.  She is independent in emptying, but will need support as she learns equipment preparation for this new stoma. Will check in with her Monday am and follow through this admission. Enrolled patient in [place] program: No WOC nursing team will not follow, but will remain available to this patient, the nursing and medical team.  Please re-consult if needed. Thanks, Maudie Flakes, MSN, RN, [place], [place], [place] [phone])

## 2015-01-04 NOTE — Progress Notes (Signed)
Utilization review completed.  

## 2015-01-05 NOTE — Progress Notes (Signed)
Patient ID: [name] [name], female   DOB: 1950/08/06, 64 y.o.   MRN: [id number] [facility] Surgery Progress Note:   2 Days Post-Op  Subjective: Mental status is clear.  Had problems with itching last night requiring IV benadryl-improved Objective: Vital signs in last 24 hours: Temp:  [98.2 F (36.8 C)-98.4 F (36.9 C)] 98.2 F (36.8 C) (07/03 0600) Pulse Rate:  [86-92] 86 (07/03 0600) Resp:  [16] 16 (07/03 0600) BP: (115-131)/(52-64) 115/64 mmHg (07/03 0600) SpO2:  [98 %-100 %] 100 % (07/03 0600)  Intake/Output from previous day: 07/02 0701 - 07/03 0700 In: 3240 [P.O.:240; I.V.:3000] Out: 2950 [Urine:2800; Stool:150] Intake/Output this shift: Total I/O In: -  Out: 300 [Urine:300]  Physical Exam: Work of breathing is not labored.  Abdomen is not hurting at present  Lab Results:  Results for orders placed or performed during the hospital encounter of 01/03/15 (from the past 48 hour(s))  Prepare RBC     Status: None   Collection Time: 01/03/15  2:20 PM  Result Value Ref Range   Order Confirmation ORDER PROCESSED BY BLOOD BANK   Basic metabolic panel     Status: Abnormal   Collection Time: 01/04/15  5:22 AM  Result Value Ref Range   Sodium 137 135 - 145 mmol/L   Potassium 4.1 3.5 - 5.1 mmol/L   Chloride 108 101 - 111 mmol/L   CO2 23 22 - 32 mmol/L   Glucose, Bld 114 (H) 65 - 99 mg/dL   BUN 6 6 - 20 mg/dL   Creatinine, Ser 0.63 0.44 - 1.00 mg/dL   Calcium 9.3 8.9 - 10.3 mg/dL   GFR calc non Af Amer >60 >60 mL/min   GFR calc Af Amer >60 >60 mL/min    Comment: (NOTE) The eGFR has been calculated using the CKD EPI equation. This calculation has not been validated in all clinical situations. eGFR's persistently <60 mL/min signify possible Chronic Kidney Disease.    Anion gap 6 5 - 15  CBC     Status: Abnormal   Collection Time: 01/04/15  5:22 AM  Result Value Ref Range   WBC 10.1 4.0 - 10.5 K/uL   RBC 3.74 (L) 3.87 - 5.11 MIL/uL   Hemoglobin 8.9 (L) 12.0 - 15.0  g/dL   HCT 29.1 (L) 36.0 - 46.0 %   MCV 77.8 (L) 78.0 - 100.0 fL   MCH 23.8 (L) 26.0 - 34.0 pg   MCHC 30.6 30.0 - 36.0 g/dL   RDW 14.1 11.5 - 15.5 %   Platelets 358 150 - 400 K/uL    Radiology/Results: No results found.  Anti-infectives: Anti-infectives    Start     Dose/Rate Route Frequency Ordered Stop   01/04/15 0300  cefoTEtan (CEFOTAN) 1 g in dextrose 5 % 50 mL IVPB     1 g 100 mL/hr over 30 Minutes Intravenous  Once 01/03/15 1759 01/04/15 0239   01/03/15 1147  cefoTEtan in Dextrose 5% (CEFOTAN) IVPB 2 g  Status:  Discontinued     2 g Intravenous Every 12 hours 01/03/15 1147 01/03/15 1745      Assessment/Plan: Problem List: Patient Active Problem List   Diagnosis Date Noted  . Pulmonary embolism 11/14/2014  . 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 Anxiety   . Upper abdominal pain   . Bowel obstruction 08/17/2014  . Nausea with vomiting 06/12/2014  . 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 Colorectal cancer, stage IV, obstructing s/p colonic diversion 06/05/2013  . Family history of colorectal  cancer 06/05/2013  . Thrush, oral 06/05/2013  . Tachycardia 06/05/2013  . Colon obstruction 05/24/2013  . Hypercalcemia 05/24/2013  . Rectal mass 05/23/2013    Will advance to soft diet for today and continue observation 2 Days Post-Op    LOS: 2 days   Matt B. Hassell Done, MD, [facility] [phone] beeper [phone]  01/05/2015 10:03 AM

## 2015-01-05 NOTE — Progress Notes (Signed)
Pt has abd swelling in RUQ above ostomy site.  Will continue to monitor.

## 2015-01-06 MED ORDER — HYDROMORPHONE HCL 2 MG PO TABS
2.0000 mg | ORAL_TABLET | ORAL | Status: DC | PRN
  Administered 2015-01-06 – 2015-01-07 (×4): 2 mg via ORAL
  Filled 2015-01-06 (×4): qty 1

## 2015-01-06 MED ORDER — RIVAROXABAN 20 MG PO TABS
20.0000 mg | ORAL_TABLET | Freq: Every day | ORAL | Status: DC
  Administered 2015-01-06: 20 mg via ORAL
  Filled 2015-01-06 (×2): qty 1

## 2015-01-06 NOTE — Consult Note (Signed)
WOC ostomy follow up Stoma type/location: RLQ loop transverse colostomy. Patient seen today in conjunction with Dr. Earlie Server visit. Stomal assessment/size: Oval stoma, 1 and 1/4 inch x 2 and 1/8 inch, edematous, slightly raised. May be flush after edema subsides. Peristomal assessment: Intact after 2 days wear time with convex pouching system.  May need to change three times per week. Treatment options for stomal/peristomal skin: Convex pouch and skin barrier ring Output liquid brown effluent Ostomy pouching: 1pc convex pouch with skin barrier ring Kellie Simmering #s [phone] and [phone]) Education provided: Patient asking questions about resuming intimacy and ways to manage pouching system during sex.  Provided information of a website that advertises feminine apparel for patients with ostomies, founded by a patient with an ostomy.  Diet discussed and patient encouraged to take in foods with protein while she is in the healing phase after surgery. She assists with pouching application today, placing barrier ring on back of pouch and taking off tape borders.  Complaining of a little swelling at the lateral margin and some discomfort.  Will benefit from a week or two of HHRN to assist with the management of this new stoma and to ensure she gets squared away with her new size in supply.  It is noted that her husband, while supportive in other ways, does not assist patient with her ostomy.  In fact, he has not seen or assisted with this to-date. She has the support of her two daughters, but they do not live with her. She is looking forward to a trip to the beach at the end of July for 5 days and she is encouraged to take that trip and continue her recovery from surgery. She is reminded to refrain from lifting heavy (>10 pounds) objects at this time. Enrolled patient in [place] Discharge program: Yes, from previous admission.  She will contact company and inform them that she is in a new sized pouch.   [place]  nursing team will follow, and will remain available to this patient, the nursing, surgical and medical teams.   Thanks, Maudie Flakes, MSN, RN, [place], [place], [place] [phone])

## 2015-01-06 NOTE — Progress Notes (Signed)
Patient ID: [name] [name], female   DOB: February 08, 1951, 64 y.o.   MRN: [id number] [facility] Surgery Progress Note:   3 Days Post-Op  Subjective: Mental status is clear.  Wanting to eat solids.  [name] [name] is working with her today on Psychiatric nurse.   Objective: Vital signs in last 24 hours: Temp:  [98.3 F (36.8 C)-98.5 F (36.9 C)] 98.5 F (36.9 C) (07/04 0527) Pulse Rate:  [90-96] 90 (07/04 0527) Resp:  [16] 16 (07/04 0527) BP: (123-131)/(56-66) 123/56 mmHg (07/04 0527) SpO2:  [99 %-100 %] 100 % (07/04 0527)  Intake/Output from previous day: 07/03 0701 - 07/04 0700 In: 2400 [I.V.:2400] Out: 4050 [Urine:3600; Stool:450] Intake/Output this shift:    Physical Exam: Work of breathing is not elevated.  Will restart Xarelto today.    Lab Results:  No results found for this or any previous visit (from the past 48 hour(s)).  Radiology/Results: No results found.  Anti-infectives: Anti-infectives    Start     Dose/Rate Route Frequency Ordered Stop   01/04/15 0300  cefoTEtan (CEFOTAN) 1 g in dextrose 5 % 50 mL IVPB     1 g 100 mL/hr over 30 Minutes Intravenous  Once 01/03/15 1759 01/04/15 0239   01/03/15 1147  cefoTEtan in Dextrose 5% (CEFOTAN) IVPB 2 g  Status:  Discontinued     2 g Intravenous Every 12 hours 01/03/15 1147 01/03/15 1745      Assessment/Plan: Problem List: Patient Active Problem List   Diagnosis Date Noted  . Pulmonary embolism 11/14/2014  . 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 Anxiety   . Upper abdominal pain   . Bowel obstruction 08/17/2014  . Nausea with vomiting 06/12/2014  . 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 Colorectal cancer, stage IV, obstructing s/p colonic diversion 06/05/2013  . Family history of colorectal cancer 06/05/2013  . Thrush, oral 06/05/2013  . Tachycardia 06/05/2013  . Colon obstruction 05/24/2013  . Hypercalcemia 05/24/2013  . Rectal mass 05/23/2013    Hopeful discharge tomorrow.  Will need home heatlh care to help  manage ostomy.   3 Days Post-Op    LOS: 3 days   Matt B. Hassell Done, MD, [facility] [phone] beeper [phone]  01/06/2015 8:25 AM

## 2015-01-07 ENCOUNTER — Encounter (HOSPITAL_COMMUNITY): Payer: Self-pay | Admitting: General Surgery

## 2015-01-07 MED ORDER — HYDROMORPHONE HCL 2 MG PO TABS: 2.0000 mg | ORAL_TABLET | ORAL | Status: DC | PRN

## 2015-01-07 NOTE — Progress Notes (Addendum)
4 Days Post-Op  Subjective: Tolerating diet.  Feels much better overall after colostomy revision.  Objective: Vital signs in last 24 hours: Temp:  [97.5 F (36.4 C)-98.9 F (37.2 C)] 97.5 F (36.4 C) (07/05 0557) Pulse Rate:  [86-97] 87 (07/05 0557) Resp:  [16] 16 (07/05 0557) BP: (119-130)/(49-64) 121/64 mmHg (07/05 0557) SpO2:  [100 %] 100 % (07/05 0557) Last BM Date: 01/06/15  Intake/Output from previous day: 07/04 0701 - 07/05 0700 In: 1840 [P.O.:960; I.V.:880] Out: 3500 [Urine:3500] Intake/Output this shift: Total I/O In: 240 [P.O.:240] Out: 200 [Urine:200]  PE: General- In NAD Abdomen-soft, edematous loop colostomy that is viable with stool output  Lab Results:  No results for input(s): WBC, HGB, HCT, PLT in the last 72 hours. BMET No results for input(s): NA, K, CL, CO2, GLUCOSE, BUN, CREATININE, CALCIUM in the last 72 hours. PT/INR No results for input(s): LABPROT, INR in the last 72 hours. Comprehensive Metabolic Panel:    Component Value Date/Time   NA 137 01/04/2015 0522   NA 138 01/01/2015 1035   NA 138 12/13/2014 1224   NA 141 11/22/2014 0753   K 4.1 01/04/2015 0522   K 5.1 01/01/2015 1035   K 4.4 12/13/2014 1224   K 3.6 11/22/2014 0753   CL 108 01/04/2015 0522   CL 107 01/01/2015 1035   CO2 23 01/04/2015 0522   CO2 24 01/01/2015 1035   CO2 22 12/13/2014 1224   CO2 22 11/22/2014 0753   BUN 6 01/04/2015 0522   BUN 14 01/01/2015 1035   BUN 8.6 12/13/2014 1224   BUN 10.6 11/22/2014 0753   CREATININE 0.63 01/04/2015 0522   CREATININE 0.65 01/01/2015 1035   CREATININE 0.7 12/13/2014 1224   CREATININE 0.6 11/22/2014 0753   GLUCOSE 114* 01/04/2015 0522   GLUCOSE 114* 01/01/2015 1035   GLUCOSE 108 12/13/2014 1224   GLUCOSE 111 11/22/2014 0753   CALCIUM 9.3 01/04/2015 0522   CALCIUM 10.9* 01/01/2015 1035   CALCIUM 10.6* 12/13/2014 1224   CALCIUM 9.8 11/22/2014 0753   AST 14* 01/01/2015 1035   AST 10 12/13/2014 1224   AST 13 11/22/2014 0753   AST 16 11/13/2014 1815   ALT 9* 01/01/2015 1035   ALT <6 12/13/2014 1224   ALT 7 11/22/2014 0753   ALT 9* 11/13/2014 1815   ALKPHOS 51 01/01/2015 1035   ALKPHOS 54 12/13/2014 1224   ALKPHOS 58 11/22/2014 0753   ALKPHOS 61 11/13/2014 1815   BILITOT 0.2* 01/01/2015 1035   BILITOT <0.20 12/13/2014 1224   BILITOT <0.20 11/22/2014 0753   BILITOT 0.6 11/13/2014 1815   PROT 7.3 01/01/2015 1035   PROT 7.0 12/13/2014 1224   PROT 6.6 11/22/2014 0753   PROT 8.0 11/13/2014 1815   ALBUMIN 3.0* 01/01/2015 1035   ALBUMIN 2.6* 12/13/2014 1224   ALBUMIN 2.5* 11/22/2014 0753   ALBUMIN 3.5 11/13/2014 1815     Studies/Results: No results found.  Anti-infectives: Anti-infectives    Start     Dose/Rate Route Frequency Ordered Stop   01/04/15 0300  cefoTEtan (CEFOTAN) 1 g in dextrose 5 % 50 mL IVPB     1 g 100 mL/hr over 30 Minutes Intravenous  Once 01/03/15 1759 01/04/15 0239   01/03/15 1147  cefoTEtan in Dextrose 5% (CEFOTAN) IVPB 2 g  Status:  Discontinued     2 g Intravenous Every 12 hours 01/03/15 1147 01/03/15 1745      Assessment Active Problems:   Transverse colostomy prolapse s/p palliative revision-doing well.  H/o PE-back on Xarelto    LOS: 4 days   Plan: Discharge.  Home health ordered.  Discharge instructions given to her.  Follow up in office in 2-3 weeks.   Tina Temme J 01/07/2015

## 2015-01-07 NOTE — Progress Notes (Signed)
Patient tolerating both diet and oral pain medicine. Vitals and stoma both WNL. Discussed discharge instructions with patient. Patient understood instructions with no questions nor concerns. Patient discharged to home with prescription for oral pain medication.

## 2015-01-07 NOTE — Care Management Note (Signed)
Case Management Note  Patient Details  Name: [name] [name] MRN: [id number] Date of Birth: 10/30/1950  Subjective/Objective:                   REVISION OF TRANSVERSE LOOP COLOSTOMY WITH PARTIAL COLECTOMY (N/A) Action/Plan: Discharge planning  Expected Discharge Date:  01/07/15             Expected Discharge Plan:  [place]  In-House Referral:     Discharge planning Services  CM Consult  Post Acute Care Choice:    Choice offered to:  Patient  DME Arranged:    DME Agency:     HH Arranged:  RN [place] Agency:  [place]  Status of Service:  Completed, signed off  Medicare Important Message Given:    Date Medicare IM Given:    Medicare IM give by:    Date Additional Medicare IM Given:    Additional Medicare Important Message give by:     If discussed at [place] of Stay Meetings, dates discussed:    Additional Comments: CM spoke with pt who states she has had [facility] and wants [facility] again.  Address and contact information verified with pt.  Referral called to [facility] rep, Lecretia.  SOC is planned for tomorrow 01/08/15 Dellie Catholic, RN 01/07/2015, 3:56 PM

## 2015-01-08 NOTE — Discharge Summary (Signed)
Physician Discharge Summary  Patient ID: [name] [name] MRN: [id number] DOB/AGE: [birth year] 64 y.o.  Admit date: 01/03/2015 Discharge date: 01/07/2015  Admission Diagnoses:  Prolapsed loop colostomy  Discharge Diagnoses:  Status post palliative repair of loop colostomy prolapse with partial colectomy 01/03/15  Metastatic colon cancer  Chronic anemia   Discharged Condition: good  Hospital Course: She underwent the above procedure and tolerated it well.  Her diet was slowly advance and her colostomy started working well.  [place] nurse saw her and assisted with colostomy care.  She was able to be discharged [facility] with [facility] on her fourth postop day.  Discharge instructions were given to her.  She did receive a blood transfusion perioperatively.   Discharge Exam: Blood pressure 121/64, pulse 87, temperature 97.5 F (36.4 C), temperature source Oral, resp. rate 16, height 5\' 7"  (1.702 m), weight 80.287 kg (177 lb), SpO2 100 %.   Disposition: 01-Home or Self Care     Medication List    TAKE these medications        acetaminophen 500 MG tablet  Commonly known as:  TYLENOL  Take 500 mg by mouth every 6 (six) hours as needed for moderate pain or headache.     HYDROmorphone 2 MG tablet  Commonly known as:  DILAUDID  Take 1 tablet (2 mg total) by mouth every 4 (four) hours as needed for severe pain.     IRON PO  Take 1 tablet by mouth every morning.     lidocaine-prilocaine cream  Commonly known as:  EMLA  Apply 1 application topically as needed. Apply to port site one hour before treatment and cover with plastic wrap.     LORazepam 0.5 MG tablet  Commonly known as:  ATIVAN  Take 1 tablet (0.5 mg total) by mouth every 8 (eight) hours as needed (nausea/vomiting).     ondansetron 8 MG tablet  Commonly known as:  ZOFRAN  Take 8mg  by mouth every 12 hours as needed for nausea/vomiting.  Take 8 mg by mouth  30 minutes before taking chemo pills, and as needed for nausea/vomiting.     polyethylene glycol powder powder  Commonly known as:  GLYCOLAX/MIRALAX  TAKE 34 G (2 DOSES) BY MOUTH DAILY AS NEEDED FOR MILD CONSTIPATION OR MODERATE CONSTIPATION     prochlorperazine 10 MG tablet  Commonly known as:  COMPAZINE  TAKE 1 TABLET (10 MG TOTAL) BY MOUTH EVERY 6 (SIX) HOURS AS NEEDED (NAUSEA OR VOMITING).     rivaroxaban 20 MG Tabs tablet  Commonly known as:  XARELTO  Take 1 tablet (20 mg total) by mouth daily with supper.     VITAMIN B 12 PO  Take 1 tablet by mouth every morning.         Signed: [name] 01/08/2015, 9:39 AM

## 2015-01-27 ENCOUNTER — Telehealth: Payer: Self-pay | Admitting: Hematology

## 2015-01-27 ENCOUNTER — Telehealth: Payer: Self-pay | Admitting: Medical Oncology

## 2015-01-27 NOTE — Telephone Encounter (Signed)
s.w. pt and advised on Aug appt....pt ok and aware °

## 2015-01-27 NOTE — Telephone Encounter (Signed)
Last flush was July 1 with surgery. Her next appt with Dr Burr Medico is in October. I sent POF to schedule port flush every 6 weeks.

## 2015-02-09 ENCOUNTER — Other Ambulatory Visit: Payer: Self-pay | Admitting: Hematology

## 2015-02-10 ENCOUNTER — Telehealth: Payer: Self-pay | Admitting: Hematology

## 2015-02-10 NOTE — Telephone Encounter (Signed)
Spoke with patient and she is aware of her new appointments 8.12

## 2015-02-13 ENCOUNTER — Encounter: Payer: Self-pay | Admitting: Hematology

## 2015-02-13 ENCOUNTER — Ambulatory Visit (HOSPITAL_BASED_OUTPATIENT_CLINIC_OR_DEPARTMENT_OTHER): Payer: Medicaid Other | Admitting: Hematology

## 2015-02-13 ENCOUNTER — Ambulatory Visit (HOSPITAL_BASED_OUTPATIENT_CLINIC_OR_DEPARTMENT_OTHER): Payer: Medicaid Other

## 2015-02-13 VITALS — BP 132/50 | HR 106 | Temp 98.6°F | Resp 16 | Ht 67.0 in | Wt 143.9 lb

## 2015-02-13 DIAGNOSIS — C19 Malignant neoplasm of rectosigmoid junction: Secondary | ICD-10-CM | POA: Diagnosis present

## 2015-02-13 DIAGNOSIS — C787 Secondary malignant neoplasm of liver and intrahepatic bile duct: Secondary | ICD-10-CM | POA: Diagnosis not present

## 2015-02-13 DIAGNOSIS — Z95828 Presence of other vascular implants and grafts: Secondary | ICD-10-CM

## 2015-02-13 DIAGNOSIS — D500 Iron deficiency anemia secondary to blood loss (chronic): Secondary | ICD-10-CM

## 2015-02-13 DIAGNOSIS — D6481 Anemia due to antineoplastic chemotherapy: Secondary | ICD-10-CM

## 2015-02-13 DIAGNOSIS — C7989 Secondary malignant neoplasm of other specified sites: Secondary | ICD-10-CM | POA: Diagnosis not present

## 2015-02-13 DIAGNOSIS — Z86711 Personal history of pulmonary embolism: Secondary | ICD-10-CM | POA: Diagnosis not present

## 2015-02-13 MED ORDER — HEPARIN SOD (PORK) LOCK FLUSH 100 UNIT/ML IV SOLN
500.0000 [IU] | Freq: Once | INTRAVENOUS | Status: AC
  Administered 2015-02-13: 500 [IU] via INTRAVENOUS
  Filled 2015-02-13: qty 5

## 2015-02-13 MED ORDER — SODIUM CHLORIDE 0.9 % IJ SOLN
10.0000 mL | INTRAMUSCULAR | Status: DC | PRN
  Administered 2015-02-13: 10 mL via INTRAVENOUS
  Filled 2015-02-13: qty 10

## 2015-02-13 MED ORDER — RIVAROXABAN 20 MG PO TABS: 20.0000 mg | ORAL_TABLET | Freq: Every day | ORAL | Status: DC

## 2015-02-13 NOTE — Patient Instructions (Signed)

## 2015-02-13 NOTE — Progress Notes (Signed)
[place] ONCOLOGY OFFICE PROGRESS NOTE    DIAGNOSIS: Metastatic colon cancer to liver   MOLECULAR FEATURES ([facility]):   Chief complaint: Follow-up colon cancer  CURRENT TREATMENT:  Observation  Oncology History   Colorectal cancer, stage IV, obstructing s/p colonic diversion   Staging form: Colon and Rectum, AJCC 7th Edition     Clinical: Stage IVA (TX, N0, M1a) - Signed by Concha Norway, MD on 09/15/2013       Colorectal cancer, stage IV, obstructing s/p colonic diversion   05/22/2013 - 05/27/2013 Hospital Admission A/w abdominal distention and constipation for 2 days CT scan of her abdomen and pelvis showed area of bowel wall thickening at the rectosigmoid junction concerning for malignancy.   05/22/2013 Imaging CT of Abdomen: bowel wall thickening with shouldering of the proximal and distal margins, involving therectosigmoid junction, supicous liver spleen mets. Colonsocopy recommended.    05/23/2013 Tumor Marker CEA 225.7   05/24/2013 Imaging MRI of abdomen: 3.7 x 4.4 cm enhancing lesion along the superior spleen, indeterminate but worrisome for metastasis.2.7 x 3.9 cm enhancing lesion in the medial segment left hepatic lobe,    05/25/2013 Pathology Results Diagnosis Rectum, biopsy, proximal - INVASIVE ADENOCARCINOMA.   05/25/2013 Procedure Flex sig: (Dr. Ardis Hughs). 5-6 cm obstructing rectosigmoid mass distal end 10 cm from anal verge; mass biopsed and then stented 9 cm long, 22 cm diameter uncovered SEM.    05/25/2013 Initial Diagnosis Colorectal cancer, stage IV   06/25/2013 Procedure R port a cath placement.   07/09/2013 - 08/20/2013 Chemotherapy FOLFOX q 2 weeks started.  Not elgible for clinical trial and bevacimab based on stent and high risk for perforation. She received a total of 4 cycles.    07/30/2013 Tumor Marker KRAS positive.  p53, APC identified. Mismatch Repair (MMR) preserved.    08/06/2013 Adverse Reaction Complains of darkening of her hands with  peeling.    08/27/2013 Family History She was seen by Genetic couselor.  She declined testing today, but will call and reschedule an appointment should she decide to pursue testing.   09/07/2013 Imaging CT abdomen: Mixed response to therapy. Response to therapy of hepatic metastasis. right hepatic hemangiomas are again identified. Other too small to characterize liver lesions are felt to be similar but are indeterminate.. Enlargement of a splenic lesions   09/13/2013 - 06/10/2014 Chemotherapy Second line chemo FOLFIRI for a total of 16 cycles    09/13/2013 Tumor Marker CEA 33.5   09/13/2013 Treatment Plan Change Reviewed scans consistent with mixed response.  Switch to FOLFIRI, she received a total of 16 cycles, with a few breaks due to hospitalization and chemo holiday.    10/29/2013 Adverse Reaction Patient reports intense abdominal cramping lasting the first few days on chemotherapy.  Starting Hyoscyamine 0.125 mg q 6 hours prn.    11/15/2013 - 11/15/2013 Hospital Admission Patient presented to ED with migrated stent.  Stent retrieved by Dr. Ardis Hughs.  Imaging negative for perforation.   Discussed in conference with referral to Dr. Zella Richer made.    11/23/2013 Imaging CT Chest. 1. Stable left lower lobe 5 mm pulmonary nodule. 2. Interval increase in size splenic mass is concerning formetastatic lesion.3. Interval calcification of the left hepatic lobe metastasis. Noevidence of active residual disease by CT.   07/01/2014 Imaging CT restaging scan showed stable disease, improved liver lesion.    07/16/2014 - 10/24/2014 Chemotherapy maintenance chemo with capacitain $RemoveBeforeD'1000mg'oFHURjxMbqxPJc$ /m2'1800mg'$ ) bid started, dose decresed to $RemoveBef'1500mg'ryPKzDIfBw$  bid on C1D8 due to tolerance issue. Pt declined surgery.  Chemo held due to hospitalization and disease progression.    08/16/2014 - 08/20/2014 Hospital Admission She was admitted for bowel obstruction from sigmoid colon mass. She declined surgery, and had sigmoid colon stent placement. Her symptoms  resolved afterwards.   10/25/2014 - 10/31/2014 Hospital Admission Admitted for bowel obstruction, CT scan showed sigmoid colon tumor growth through the stent, pt underwent Transverse loop colostomy on 10/28/2014.    11/13/2014 - 11/18/2014 Hospital Admission she was admitted for transverse loop colostomy prolapse, managed conservatively. CT chest showed RLL PE, discharged home on Xarelto    01/03/2015 - 01/07/2015 Hospital Admission palliative repair of loop colostomy prolapse with partial colectomy 01/03/15    INTERVAL HISTORY:  [name] [name] 64 y.o. female with a history of Stage IV colon cancer is here for follow-up.  She underwent repair of loop colostomy with partial colectomy on 01/03/2015 by Dr. Orpah Melter. She recovered well well and overall feels much better. She has good energy level and appetite, remains active at home and her church. She has strong region face. She denies any pain, nausea, dyspnea, or recent weight loss. Her bowel movement is normal.  Past Medical History  Diagnosis Date  . Colon cancer     dx'd 2014  . Heart murmur     ? as a child   . Pulmonary embolism 11/2014   . Anemia     hx of     ALLERGIES:  is allergic to oxycontin and codeine.  MEDICATIONS: has a current medication list which includes the following prescription(s): acetaminophen, cyanocobalamin, hydromorphone, iron, lidocaine-prilocaine, ondansetron, rivaroxaban, lorazepam, polyethylene glycol powder, and prochlorperazine, and the following Facility-Administered Medications: sodium chloride.  SURGICAL HISTORY:  Past Surgical History  Procedure Laterality Date  . Abdominal hysterectomy  2005  . Cesarean section  1976  . Flexible sigmoidoscopy N/A 05/25/2013    Procedure: FLEXIBLE SIGMOIDOSCOPY;  Surgeon: [name], MD;  Location: [facility];  Service: Endoscopy;  Laterality: N/A;  needs floro  . Colonic stent placement N/A 05/25/2013    Procedure: COLONIC STENT PLACEMENT;  Surgeon: [name],  MD;  Location: [facility];  Service: Endoscopy;  Laterality: N/A;  . Portacath placement Right 06/25/2013    Procedure: ULTRA SOUND GUIDED INSERTION PORT-A-CATH;  Surgeon: [name], MD;  Location: [facility];  Service: General;  Laterality: Right;  . Flexible sigmoidoscopy N/A 08/19/2014    Procedure: FLEXIBLE SIGMOIDOSCOPY;  Surgeon: Gatha Mayer, MD;  Location: [facility];  Service: Endoscopy;  Laterality: N/A;  . Colonic stent placement N/A 08/19/2014    Procedure: COLONIC STENT PLACEMENT;  Surgeon: Gatha Mayer, MD;  Location: [facility];  Service: Endoscopy;  Laterality: N/A;  . Flexible sigmoidoscopy N/A 08/19/2014    Procedure: FLEXIBLE SIGMOIDOSCOPY;  Surgeon: Gatha Mayer, MD;  Location: [facility];  Service: Endoscopy;  Laterality: N/A;  . Colonic stent placement N/A 08/19/2014    Procedure: COLONIC STENT PLACEMENT;  Surgeon: Gatha Mayer, MD;  Location: [facility];  Service: Endoscopy;  Laterality: N/A;  . Colon resection N/A 10/28/2014    Procedure: Transverse loop colostomy;  Surgeon: [name], MD;  Location: [facility];  Service: General;  Laterality: N/A;  . Colostomy revision N/A 01/03/2015    Procedure: REVISION OF TRANSVERSE LOOP COLOSTOMY WITH PARTIAL  COLECTOMY;  Surgeon: [name], MD;  Location: [facility];  Service: General;  Laterality: N/A;    REVIEW OF SYSTEMS:   Constitutional: Denies fevers, chills or abnormal weight loss Eyes: Denies blurriness of  vision Ears, nose, mouth, throat, and face: Denies mucositis or sore throat Respiratory: Denies cough, dyspnea or wheezes Cardiovascular: Denies palpitation, chest discomfort or lower extremity swelling Gastrointestinal:  Denies nausea, heartburn or change in bowel habits Skin: Denies abnormal skin rashes Lymphatics: Denies new lymphadenopathy or easy bruising Neurological:Denies numbness, tingling or new weaknesses Behavioral/Psych: Mood is stable, no new changes  All other  systems were reviewed with the patient and are negative.  PHYSICAL EXAMINATION: ECOG PERFORMANCE STATUS: 1  Blood pressure 132/50, pulse 106, temperature 98.6 F (37 C), temperature source Oral, resp. rate 16, height 5' 7" (1.702 m), weight 143 lb 14.4 oz (65.273 kg), SpO2 100 %.  GENERAL:alert, no distress and comfortable; well-developed, well nourished.  SKIN: skin color, texture, turgor are normal, no rashes or significant lesions; + R Port a cath without tenderness; hyperpigmentation of her hands.  EYES: normal, Conjunctiva are pink and non-injected, sclera clear  OROPHARYNX:no exudate, no erythema and lips, buccal mucosa  NECK: supple, thyroid normal size, non-tender, without nodularity  LYMPH: no palpable lymphadenopathy in the cervical, axillary or supraclavicular  LUNGS: clear to auscultation and percussion with normal breathing effort  HEART: Tachycardic with regular rhythm and no murmurs and no lower extremity edema  ABDOMEN:abdomen soft, non-tender and normal bowel sounds. (+) Colostomy with healthy looking tissue, no prolapse  Musculoskeletal:no cyanosis of digits and no clubbing  NEURO: alert & oriented x 3 with fluent speech, no focal motor/sensory deficits EXTREMITIES: Positive for thickening of nail bed, mild pinkish discharge from the left third nail bed, mild tenderness, no skin erythema or swelling.  Labs:  CBC Latest Ref Rng 01/04/2015 01/01/2015 12/13/2014  WBC 4.0 - 10.5 K/uL 10.1 7.5 7.4  Hemoglobin 12.0 - 15.0 g/dL 8.9(L) 7.7(L) 8.0(L)  Hematocrit 36.0 - 46.0 % 29.1(L) 25.1(L) 25.3(L)  Platelets 150 - 400 K/uL 358 472(H) 387   CMP Latest Ref Rng 01/04/2015 01/01/2015 12/13/2014  Glucose 65 - 99 mg/dL 114(H) 114(H) 108  BUN 6 - 20 mg/dL 6 14 8.6  Creatinine 0.44 - 1.00 mg/dL 0.63 0.65 0.7  Sodium 135 - 145 mmol/L 137 138 138  Potassium 3.5 - 5.1 mmol/L 4.1 5.1 4.4  Chloride 101 - 111 mmol/L 108 107 -  CO2 22 - 32 mmol/L _0 Calcium 8.9 - 10.3 mg/dL 9.3 10.9(H)  10.6(H)  Total Protein 6.5 - 8.1 g/dL - 7.3 7.0  Total Bilirubin 0.3 - 1.2 mg/dL - 0.2(L) <0.20  Alkaline Phos 38 - 126 U/L - 51 54  AST 15 - 41 U/L - 14(L) 10  ALT 14 - 54 U/L - 9(L) <6      RADIOGRAPHIC STUDIES: CT abdomen and pelvis 11/13/2014  IMPRESSION: Sigmoid colon carcinoma with wall stent in place. Tumor invades the stent and into the surrounding pericolonic fat. Proximal stool-filled colon is more decompressed than on prior study with interval placement of a diverting right ileostomy. Peristomal hernia containing fat. No proximal gastric or small bowel dilatation to suggest obstruction. Multiple liver lesions including a stable treated metastasis and multiple cavernous hemangiomas. 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 Nonobstructing stones in the left kidney. Changes in the right lung base as described are worrisome for pulmonary embolus. If clinically indicated, CT PE study of the chest is suggested.  CT chest 11/13/2014 IMPRESSION: Positive study for pulmonary emboli in the right lower lobe pulmonary arteries. Small wedge-shaped infiltration in the right lung base is likely an infarct.  ASSESSMENT: [name] [name] 64 y.o. female with  a history of stage IV colon adenocarcinoma, KRAS mutation(+), on capecitabine maintenance therapy..   1. 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 Liver and spleen mets are stable.  -I discussed the option of restarting chemo. She had mFOLFOX 4 cycles at the beginning, stable disease, held due to some side effects (fatigue and nail change), her tumor is probably still sensitive to oxaliplatin. I would consider adding Avastin to chemotherapy, which she had never had before. Since she has diverging colostomy now, the risk of  tumor perforation is not very high. Single agent irinotecan with or without avastin is also an reasonable option. -Due to the K-ras mutation, she is not candidate for EGFR inhibitor therapy -Her tumor has stable MSI, unlikely will response to immunotherapy -She clearly states that she would not consider chemotherapy in the near future, she doesn't want get sick. She also has strong region faith.  -We discussed the option of clinical trial, she is not interested. -She voiced good understanding that her tumor will likely progress when she is off treatment. -After the lengthy discussion, we decided that we'll monitor her clinically for now, and repeat her scan in December 2016.  2. PE diagnosed on 11/13/2014 -Continue Xarelto. She is tolerating well. I refilled for her today  3. Anemia, secondary to chemo, and chronic blood loss  -She is taking ferrous sulfate -I'll check her CBC and iron level on her next port flush in a month  4. 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  -chronic elevated Ca, stable. She knows to drink more fluids, avoid dehydration -zometa 64m iv on 07/25/13 and 06/12/2014. She had some abdominal discomfort and after Zometa infusion, and is hesitated to have more at this point. -We'll continue watch.   PLAN -Monthly port flush -Lab in a month with CBC, CMP, IRON study  -I'll see her back in 2 months  All questions were answered. The patient knows to call the clinic with any problems, questions or concerns. We can certainly see the patient much sooner if necessary.  I spent 20 minutes counseling the patient face to face. The total time spent in the appointment was 25 minutes.  FTruitt Merle 02/13/2015

## 2015-02-22 ENCOUNTER — Encounter (HOSPITAL_COMMUNITY): Payer: Self-pay

## 2015-02-22 ENCOUNTER — Emergency Department (HOSPITAL_COMMUNITY)
Admission: EM | Admit: 2015-02-22 | Discharge: 2015-02-22 | Disposition: A | Discharge status: Home / Self Care | Payer: Medicaid Other | Attending: Emergency Medicine | Admitting: Emergency Medicine

## 2015-02-22 DIAGNOSIS — D649 Anemia, unspecified: Secondary | ICD-10-CM | POA: Insufficient documentation

## 2015-02-22 DIAGNOSIS — Z85038 Personal history of other malignant neoplasm of large intestine: Secondary | ICD-10-CM | POA: Diagnosis not present

## 2015-02-22 DIAGNOSIS — K9409 Other complications of colostomy: Secondary | ICD-10-CM | POA: Insufficient documentation

## 2015-02-22 DIAGNOSIS — R109 Unspecified abdominal pain: Secondary | ICD-10-CM | POA: Diagnosis present

## 2015-02-22 DIAGNOSIS — Z86711 Personal history of pulmonary embolism: Secondary | ICD-10-CM | POA: Diagnosis not present

## 2015-02-22 DIAGNOSIS — Z87891 Personal history of nicotine dependence: Secondary | ICD-10-CM | POA: Diagnosis not present

## 2015-02-22 DIAGNOSIS — R011 Cardiac murmur, unspecified: Secondary | ICD-10-CM | POA: Diagnosis not present

## 2015-02-22 MED ORDER — ONDANSETRON HCL 4 MG/2ML IJ SOLN: 4.0000 mg | Freq: Once | INTRAMUSCULAR | Status: DC

## 2015-02-22 MED ORDER — FENTANYL CITRATE (PF) 100 MCG/2ML IJ SOLN
100.0000 ug | Freq: Once | INTRAMUSCULAR | Status: AC
  Administered 2015-02-22: 100 ug via INTRAVENOUS

## 2015-02-22 MED ORDER — FENTANYL CITRATE (PF) 100 MCG/2ML IJ SOLN
INTRAMUSCULAR | Status: AC
  Filled 2015-02-22: qty 2

## 2015-02-22 MED ORDER — DIAZEPAM 5 MG/ML IJ SOLN
2.5000 mg | Freq: Once | INTRAMUSCULAR | Status: AC
  Administered 2015-02-22: 2.5 mg via INTRAVENOUS
  Filled 2015-02-22: qty 2

## 2015-02-22 NOTE — ED Notes (Signed)
She states she has had colostomy performed by Dr. Zella Richer.  She states that additional intestines are protruding from ostomy and she has pain at ostomy site.  No bleeding noted.

## 2015-02-22 NOTE — ED Provider Notes (Signed)
CSN: [id number]     Arrival date & time 02/22/15  1333 History   First MD Initiated Contact with Patient 02/22/15 1400     Chief Complaint  Patient presents with  . Abdominal Pain     (Consider location/radiation/quality/duration/timing/severity/associated sxs/prior Treatment) HPI  64 year old female with a history of colon cancer presents with acute abdominal pain surrounding her colostomy site. Has a new onset of bowel protruding from her site. She states immediately after the pain started about an hour ago she looked down and noticed that there was bowel protruding. Otherwise her site has been normal with normal output. No nausea or vomiting. This happened before several months ago and at the beginning of July she had a revision and repair by Dr. Zella Richer after having bowel out for several months.   Past Medical History  Diagnosis Date  . Colon cancer     dx'd 2014  . Heart murmur     ? as a child   . Pulmonary embolism 11/2014   . Anemia     hx of    Past Surgical History  Procedure Laterality Date  . Abdominal hysterectomy  2005  . Cesarean section  1976  . Flexible sigmoidoscopy N/A 05/25/2013    Procedure: FLEXIBLE SIGMOIDOSCOPY;  Surgeon: [name], MD;  Location: [facility];  Service: Endoscopy;  Laterality: N/A;  needs floro  . Colonic stent placement N/A 05/25/2013    Procedure: COLONIC STENT PLACEMENT;  Surgeon: [name], MD;  Location: [facility];  Service: Endoscopy;  Laterality: N/A;  . Portacath placement Right 06/25/2013    Procedure: ULTRA SOUND GUIDED INSERTION PORT-A-CATH;  Surgeon: [name], MD;  Location: [facility];  Service: General;  Laterality: Right;  . Flexible sigmoidoscopy N/A 08/19/2014    Procedure: FLEXIBLE SIGMOIDOSCOPY;  Surgeon: Gatha Mayer, MD;  Location: [facility];  Service: Endoscopy;  Laterality: N/A;  . Colonic stent placement N/A 08/19/2014    Procedure: COLONIC STENT PLACEMENT;  Surgeon: Gatha Mayer, MD;  Location: [facility];  Service: Endoscopy;  Laterality: N/A;  . Flexible sigmoidoscopy N/A 08/19/2014    Procedure: FLEXIBLE SIGMOIDOSCOPY;  Surgeon: Gatha Mayer, MD;  Location: [facility];  Service: Endoscopy;  Laterality: N/A;  . Colonic stent placement N/A 08/19/2014    Procedure: COLONIC STENT PLACEMENT;  Surgeon: Gatha Mayer, MD;  Location: [facility];  Service: Endoscopy;  Laterality: N/A;  . Colon resection N/A 10/28/2014    Procedure: Transverse loop colostomy;  Surgeon: [name], MD;  Location: [facility];  Service: General;  Laterality: N/A;  . Colostomy revision N/A 01/03/2015    Procedure: REVISION OF TRANSVERSE LOOP COLOSTOMY WITH PARTIAL  COLECTOMY;  Surgeon: [name], MD;  Location: [facility];  Service: General;  Laterality: N/A;   Family History  Problem Relation Age of Onset  . Colon cancer Sister 42  . Diabetes Neg Hx   . Thyroid disease Neg Hx   . Breast cancer Maternal Aunt   . Prostate cancer Maternal Uncle   . Bone cancer Maternal Grandfather   . Prostate cancer Maternal Uncle    Social History  Substance Use Topics  . Smoking status: Former Smoker -- 1 years    Types: Cigarettes    Quit date: 05/24/1971  . Smokeless tobacco: Never Used  . Alcohol Use: No   OB History    No data available     Review of Systems  Constitutional: Negative for fever.  Gastrointestinal: Positive for  abdominal pain. Negative for nausea and vomiting.  All other systems reviewed and are negative.     Allergies  Oxycontin and Codeine  Home Medications   Prior to Admission medications   Medication Sig Start Date End Date Taking? Authorizing Provider  acetaminophen (TYLENOL) 500 MG tablet Take 500 mg by mouth every 6 (six) hours as needed for moderate pain or headache.    Yes Historical Provider, MD  Cyanocobalamin (VITAMIN B 12 PO) Take 1 tablet by mouth every morning.   Yes Historical Provider, MD  HYDROmorphone (DILAUDID) 2 MG tablet Take 1  tablet (2 mg total) by mouth every 4 (four) hours as needed for severe pain. 01/07/15  Yes [name], MD  IRON PO Take 1 tablet by mouth every morning.   Yes Historical Provider, MD  lidocaine-prilocaine (EMLA) cream Apply 1 application topically as needed. Apply to port site one hour before treatment and cover with plastic wrap. 07/02/13  Yes Concha Norway, MD  LORazepam (ATIVAN) 0.5 MG tablet Take 1 tablet (0.5 mg total) by mouth every 8 (eight) hours as needed (nausea/vomiting). 04/22/14  Yes Aasim Lona Kettle, MD  ondansetron (ZOFRAN) 8 MG tablet Take 8mg  by mouth every 12 hours as needed for nausea/vomiting.  Take 8 mg by mouth  30 minutes before taking chemo pills, and as needed for nausea/vomiting. 07/15/14  Yes Truitt Merle, MD  polyethylene glycol powder (GLYCOLAX/MIRALAX) powder TAKE 34 G (2 DOSES) BY MOUTH DAILY AS NEEDED FOR MILD CONSTIPATION OR MODERATE CONSTIPATION 03/03/14  Yes Concha Norway, MD  prochlorperazine (COMPAZINE) 10 MG tablet TAKE 1 TABLET (10 MG TOTAL) BY MOUTH EVERY 6 (SIX) HOURS AS NEEDED (NAUSEA OR VOMITING). 01/01/14  Yes Concha Norway, MD  rivaroxaban (XARELTO) 20 MG TABS tablet Take 1 tablet (20 mg total) by mouth daily with supper. 02/13/15  Yes Truitt Merle, MD   BP 107/58 mmHg  Pulse 91  Temp(Src) 98.1 F (36.7 C) (Oral)  Resp 18  SpO2 100% Physical Exam  Constitutional: She is oriented to person, place, and time. She appears well-developed and well-nourished.  HENT:  Head: Normocephalic and atraumatic.  Right Ear: External ear normal.  Left Ear: External ear normal.  Nose: Nose normal.  Eyes: Right eye exhibits no discharge. Left eye exhibits no discharge.  Cardiovascular: Normal rate, regular rhythm and normal heart sounds.   Pulmonary/Chest: Effort normal and breath sounds normal.  Abdominal: Soft. She exhibits no distension. There is no tenderness.    Neurological: She is alert and oriented to person, place, and time.  Skin: Skin is warm and dry.  Nursing note and  vitals reviewed.   ED Course  Procedures (including critical care time) Labs Review   MDM   Final diagnoses:  Colostomy prolapse    Patient given IV pain and muscle relaxation. Ice applied to the site. I am unable to reduce with gentle manipulation. Dr. Zella Richer able to reduce, patient stable for discharge home. No signs of obstruction. No indication for labs or imaging. Given strict return precautions.    Sherwood Gambler, MD 02/22/15 [id number]

## 2015-02-22 NOTE — Discharge Instructions (Signed)
End Ileostomy Reversal An end ileostomy reversal is surgery that reverses a temporary end ileostomy. The small intestine is disconnected from the opening in the abdomen (stoma). It is then reconnected to the remaining intestine inside the body. A stoma and bag are no longer needed. Bowel movements can resume through the rectum. A reversal may be done after another part of your bowel has had time to heal and your caregiver feels that you are healthy enough to have surgery. LET YOUR CAREGIVER KNOW ABOUT:  Allergies to food or medicine.  Medicines taken, including vitamins, health supplements, herbs, eyedrops, over-the-counter medicines, and creams.  Use of steroids (by mouth or creams).  Previous problems with anesthetics or numbing medicines.  History of bleeding problems or blood clots.  Previous surgery.  Other health problems, including diabetes and kidney problems.  Possibility of pregnancy, if this applies. RISKS AND COMPLICATIONS  Reaction to anesthesia.  Infection inside the abdominal cavity (abscess).  Wound infection where the stoma was.  Blood clot.  Bleeding.  Scarring.  Slow recovery of intestinal function (ileus).  Intestinal blockage.  Intestinal leakage.  Narrowing of the joined area. BEFORE THE PROCEDURE It is important to follow your caregiver's instructions prior to your procedure. This helps to avoid complications. Steps before your procedure may include:  A physical exam, rectal exam, X-rays, and other procedures.  A review of the procedure, the anesthetic being used, and what to expect after the procedure. You may be asked to:  Stop taking certain medicines for several days prior to your procedure. This may include blood thinners, such as aspirin.  Take certain medicines, such as laxatives or stool softeners.  Follow a special diet for several days prior to the procedure.  Avoid eating and drinking after midnight the night before the  procedure. This will help you to avoid complications from the anesthesia.  Quit smoking. Smoking increases the chances of a healing problem after your procedure. PROCEDURE You will be given medicine that makes you sleep (general anesthetic). The procedure may be done as open surgery, with a large cut (incision). It may also be done as laparoscopic surgery, with several smaller incisions, if possible. During an end ileostomy reversal, the surgeon will make an incision in the abdomen to access the large and small intestines. Scar tissue that may have formed between your intestines since your last surgery will be cut (lysed). The surgeon will then stitch or staple the intestine back together. This surgery requires a fair amount of recovery time in the hospital. [place] will be given pain medicine.  Your caregivers will slowly increase your diet and movement.  You can expect to be in the hospital for about 5 to 10 days.  You should arrange for someone to help you with activities at home while you recover. Document Released: 06/10/2011 Document Revised: 09/13/2011 Document Reviewed: 06/10/2011 [facility] Patient Information 2015 [place], [place]. This information is not intended to replace advice given to you by your health care provider. Make sure you discuss any questions you have with your health care provider.

## 2015-02-22 NOTE — Consult Note (Signed)
Reason for Consult:Colostomy prolapse Referring Physician: Dr. Manus Gunning is an 64 y.o. female.  HPI: She is well known to me. Proximal to 6 weeks ago, she underwent revision of a pale elective loop transverse colostomy. 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 This morning, she noted some cramping lower abdominal pain and more prominence of the colostomy. It appeared "floppy." She presented to the emergency department. She is given some narcotics and muscle relaxants as well as an ice pack. She states the area has gone back down to normal now. She states she is not doing any heavy activity.  She called me and spoke with me as she was waiting in the emergency department waiting area.  Past Medical History  Diagnosis Date  . Colon cancer     dx'd 2014  . Heart murmur     ? as a child   . Pulmonary embolism 11/2014   . Anemia     hx of     Past Surgical History  Procedure Laterality Date  . Abdominal hysterectomy  2005  . Cesarean section  1976  . Flexible sigmoidoscopy N/A 05/25/2013    Procedure: FLEXIBLE SIGMOIDOSCOPY;  Surgeon: [name], MD;  Location: [facility];  Service: Endoscopy;  Laterality: N/A;  needs floro  . Colonic stent placement N/A 05/25/2013    Procedure: COLONIC STENT PLACEMENT;  Surgeon: [name], MD;  Location: [facility];  Service: Endoscopy;  Laterality: N/A;  . Portacath placement Right 06/25/2013    Procedure: ULTRA SOUND GUIDED INSERTION PORT-A-CATH;  Surgeon: [name], MD;  Location: [place];  Service: General;  Laterality: Right;  . Flexible sigmoidoscopy N/A 08/19/2014    Procedure: FLEXIBLE SIGMOIDOSCOPY;  Surgeon: Gatha Mayer, MD;  Location: [facility];  Service: Endoscopy;  Laterality: N/A;  . Colonic stent placement N/A 08/19/2014    Procedure: COLONIC STENT PLACEMENT;  Surgeon: Gatha Mayer, MD;  Location: [facility];  Service: Endoscopy;  Laterality: N/A;   . Flexible sigmoidoscopy N/A 08/19/2014    Procedure: FLEXIBLE SIGMOIDOSCOPY;  Surgeon: Gatha Mayer, MD;  [facility]: [facility];  Service: Endoscopy;  Laterality: N/A;  . Colonic stent placement N/A 08/19/2014    Procedure: COLONIC STENT PLACEMENT;  Surgeon: Gatha Mayer, MD;  Location: [facility];  Service: Endoscopy;  Laterality: N/A;  . Colon resection N/A 10/28/2014    Procedure: Transverse loop colostomy;  Surgeon: [name], MD;  Location: [facility];  Service: General;  Laterality: N/A;  . Colostomy revision N/A 01/03/2015    Procedure: REVISION OF TRANSVERSE LOOP COLOSTOMY WITH PARTIAL  COLECTOMY;  Surgeon: [name], MD;  Location: [facility];  Service: General;  Laterality: N/A;    Family History  Problem Relation Age of Onset  . Colon cancer Sister 37  . Diabetes Neg Hx   . Thyroid disease Neg Hx   . Breast cancer Maternal Aunt   . Prostate cancer Maternal Uncle   . Bone cancer Maternal Grandfather   . Prostate cancer Maternal Uncle     Social History:  reports that she quit smoking about 43 years ago. Her smoking use included Cigarettes. She quit after 1 year of use. She has never used smokeless tobacco. She reports that she does not drink alcohol or use illicit drugs.  Allergies:  Allergies  Allergen Reactions  . Oxycontin [Oxycodone Hcl] Anaphylaxis, Hives and Itching  . Codeine Itching and Nausea And Vomiting    Prior  to Admission medications   Medication Sig Start Date End Date Taking? Authorizing Provider  acetaminophen (TYLENOL) 500 MG tablet Take 500 mg by mouth every 6 (six) hours as needed for moderate pain or headache.    Yes Historical Provider, MD  Cyanocobalamin (VITAMIN B 12 PO) Take 1 tablet by mouth every morning.   Yes Historical Provider, MD  HYDROmorphone (DILAUDID) 2 MG tablet Take 1 tablet (2 mg total) by mouth every 4 (four) hours as needed for severe pain. 01/07/15  Yes [name], MD  IRON PO Take 1 tablet by mouth every morning.    Yes Historical Provider, MD  lidocaine-prilocaine (EMLA) cream Apply 1 application topically as needed. Apply to port site one hour before treatment and cover with plastic wrap. 07/02/13  Yes Concha Norway, MD  LORazepam (ATIVAN) 0.5 MG tablet Take 1 tablet (0.5 mg total) by mouth every 8 (eight) hours as needed (nausea/vomiting). 04/22/14  Yes Aasim Lona Kettle, MD  ondansetron (ZOFRAN) 8 MG tablet Take 8mg  by mouth every 12 hours as needed for nausea/vomiting.  Take 8 mg by mouth  30 minutes before taking chemo pills, and as needed for nausea/vomiting. 07/15/14  Yes Truitt Merle, MD  polyethylene glycol powder (GLYCOLAX/MIRALAX) powder TAKE 34 G (2 DOSES) BY MOUTH DAILY AS NEEDED FOR MILD CONSTIPATION OR MODERATE CONSTIPATION 03/03/14  Yes Concha Norway, MD  prochlorperazine (COMPAZINE) 10 MG tablet TAKE 1 TABLET (10 MG TOTAL) BY MOUTH EVERY 6 (SIX) HOURS AS NEEDED (NAUSEA OR VOMITING). 01/01/14  Yes Concha Norway, MD  rivaroxaban (XARELTO) 20 MG TABS tablet Take 1 tablet (20 mg total) by mouth daily with supper. 02/13/15  Yes Truitt Merle, MD     No results found for this or any previous visit (from the past 48 hour(s)).  No results found.  Review of Systems  Constitutional: Negative for fever and chills.  Gastrointestinal: Positive for abdominal pain. Negative for nausea.   Blood pressure 107/58, pulse 91, temperature 98.1 F (36.7 C), temperature source Oral, resp. rate 18, SpO2 100 %. Physical Exam  Constitutional:  Thin female. No acute distress. Very pleasant and cooperative.  GI:  Soft, flat, right upper quadrant colostomy is not prolapsed at the time.    Assessment/Plan: Transient colostomy prolapse that has resolved spontaneously. She is already status post preoperative loop colostomy and revision.  Plan: Rest with strict light activities for 1 week. Ice pack to area over the weekend. Call the office as needed.  Icesis Renn J 02/22/2015, 3:33 PM

## 2015-03-02 ENCOUNTER — Encounter (HOSPITAL_COMMUNITY): Payer: Self-pay

## 2015-03-02 ENCOUNTER — Emergency Department (HOSPITAL_COMMUNITY)
Admission: EM | Admit: 2015-03-02 | Discharge: 2015-03-02 | Disposition: A | Discharge status: Home / Self Care | Payer: Medicaid Other | Source: Home / Self Care | Attending: Emergency Medicine | Admitting: Emergency Medicine

## 2015-03-02 ENCOUNTER — Encounter (HOSPITAL_COMMUNITY): Payer: Self-pay | Admitting: Emergency Medicine

## 2015-03-02 ENCOUNTER — Inpatient Hospital Stay (HOSPITAL_COMMUNITY)
Admission: EM | Admit: 2015-03-02 | Discharge: 2015-03-12 | DRG: 330 | Disposition: A | Discharge status: Home Health | Payer: Medicaid Other | Attending: Surgery | Admitting: Surgery

## 2015-03-02 DIAGNOSIS — Z79899 Other long term (current) drug therapy: Secondary | ICD-10-CM | POA: Insufficient documentation

## 2015-03-02 DIAGNOSIS — D6481 Anemia due to antineoplastic chemotherapy: Secondary | ICD-10-CM | POA: Diagnosis present

## 2015-03-02 DIAGNOSIS — Y838 Other surgical procedures as the cause of abnormal reaction of the patient, or of later complication, without mention of misadventure at the time of the procedure: Secondary | ICD-10-CM | POA: Insufficient documentation

## 2015-03-02 DIAGNOSIS — Z86711 Personal history of pulmonary embolism: Secondary | ICD-10-CM

## 2015-03-02 DIAGNOSIS — K9409 Other complications of colostomy: Secondary | ICD-10-CM

## 2015-03-02 DIAGNOSIS — Z87891 Personal history of nicotine dependence: Secondary | ICD-10-CM | POA: Insufficient documentation

## 2015-03-02 DIAGNOSIS — T451X5A Adverse effect of antineoplastic and immunosuppressive drugs, initial encounter: Secondary | ICD-10-CM | POA: Diagnosis present

## 2015-03-02 DIAGNOSIS — Z808 Family history of malignant neoplasm of other organs or systems: Secondary | ICD-10-CM

## 2015-03-02 DIAGNOSIS — K435 Parastomal hernia without obstruction or  gangrene: Secondary | ICD-10-CM | POA: Diagnosis present

## 2015-03-02 DIAGNOSIS — Z90710 Acquired absence of both cervix and uterus: Secondary | ICD-10-CM | POA: Insufficient documentation

## 2015-03-02 DIAGNOSIS — D62 Acute posthemorrhagic anemia: Secondary | ICD-10-CM | POA: Diagnosis present

## 2015-03-02 DIAGNOSIS — D649 Anemia, unspecified: Secondary | ICD-10-CM | POA: Insufficient documentation

## 2015-03-02 DIAGNOSIS — K9403 Colostomy malfunction: Secondary | ICD-10-CM | POA: Insufficient documentation

## 2015-03-02 DIAGNOSIS — Z85038 Personal history of other malignant neoplasm of large intestine: Secondary | ICD-10-CM | POA: Insufficient documentation

## 2015-03-02 DIAGNOSIS — C787 Secondary malignant neoplasm of liver and intrahepatic bile duct: Secondary | ICD-10-CM | POA: Diagnosis present

## 2015-03-02 DIAGNOSIS — F419 Anxiety disorder, unspecified: Secondary | ICD-10-CM | POA: Diagnosis present

## 2015-03-02 DIAGNOSIS — R011 Cardiac murmur, unspecified: Secondary | ICD-10-CM | POA: Insufficient documentation

## 2015-03-02 DIAGNOSIS — Z800 Family history of malignant neoplasm of digestive organs: Secondary | ICD-10-CM

## 2015-03-02 DIAGNOSIS — R1013 Epigastric pain: Secondary | ICD-10-CM | POA: Diagnosis present

## 2015-03-02 DIAGNOSIS — R000 Tachycardia, unspecified: Secondary | ICD-10-CM | POA: Diagnosis not present

## 2015-03-02 DIAGNOSIS — R911 Solitary pulmonary nodule: Secondary | ICD-10-CM | POA: Diagnosis present

## 2015-03-02 DIAGNOSIS — Z23 Encounter for immunization: Secondary | ICD-10-CM

## 2015-03-02 DIAGNOSIS — R112 Nausea with vomiting, unspecified: Secondary | ICD-10-CM | POA: Diagnosis present

## 2015-03-02 DIAGNOSIS — K635 Polyp of colon: Secondary | ICD-10-CM | POA: Diagnosis present

## 2015-03-02 DIAGNOSIS — Z803 Family history of malignant neoplasm of breast: Secondary | ICD-10-CM

## 2015-03-02 DIAGNOSIS — C19 Malignant neoplasm of rectosigmoid junction: Principal | ICD-10-CM | POA: Diagnosis present

## 2015-03-02 DIAGNOSIS — C189 Malignant neoplasm of colon, unspecified: Secondary | ICD-10-CM

## 2015-03-02 DIAGNOSIS — Z885 Allergy status to narcotic agent status: Secondary | ICD-10-CM

## 2015-03-02 DIAGNOSIS — Z7901 Long term (current) use of anticoagulants: Secondary | ICD-10-CM

## 2015-03-02 DIAGNOSIS — K561 Intussusception: Secondary | ICD-10-CM | POA: Diagnosis present

## 2015-03-02 LAB — CBC
HCT: 31.5 % — ABNORMAL LOW (ref 36.0–46.0)
Hemoglobin: 9.6 g/dL — ABNORMAL LOW (ref 12.0–15.0)
MCH: 23.2 pg — AB (ref 26.0–34.0)
MCHC: 30.5 g/dL (ref 30.0–36.0)
MCV: 76.3 fL — AB (ref 78.0–100.0)
PLATELETS: 373 10*3/uL (ref 150–400)
RBC: 4.13 MIL/uL (ref 3.87–5.11)
RDW: 16.5 % — AB (ref 11.5–15.5)
WBC: 7 10*3/uL (ref 4.0–10.5)

## 2015-03-02 LAB — BASIC METABOLIC PANEL
Anion gap: 6 (ref 5–15)
BUN: 15 mg/dL (ref 6–20)
CHLORIDE: 113 mmol/L — AB (ref 101–111)
CO2: 20 mmol/L — AB (ref 22–32)
CREATININE: 0.55 mg/dL (ref 0.44–1.00)
Calcium: 10.8 mg/dL — ABNORMAL HIGH (ref 8.9–10.3)
GFR calc Af Amer: >60 mL/min (ref >60)
GFR calc non Af Amer: >60 mL/min (ref >60)
GLUCOSE: 89 mg/dL (ref 65–99)
POTASSIUM: 4.2 mmol/L (ref 3.5–5.1)
Sodium: 139 mmol/L (ref 135–145)

## 2015-03-02 MED ORDER — HYDROMORPHONE HCL 1 MG/ML IJ SOLN
0.5000 mg | Freq: Once | INTRAMUSCULAR | Status: AC
  Administered 2015-03-02: 0.5 mg via INTRAVENOUS
  Filled 2015-03-02: qty 1

## 2015-03-02 MED ORDER — FENTANYL CITRATE (PF) 100 MCG/2ML IJ SOLN
50.0000 ug | Freq: Once | INTRAMUSCULAR | Status: AC
  Administered 2015-03-02: 50 ug via INTRAVENOUS
  Filled 2015-03-02: qty 2

## 2015-03-02 NOTE — ED Provider Notes (Signed)
Pt has history of recurrent issues with recurrent colostomy prolapse.  Pt has non resectable colon ca for which she had the colostomy.  Pt noticed prolapse of her colostomy again this am.  No vomiting.  No severe pain.  Physical Exam  BP 124/60 mmHg  Pulse 91  Temp(Src) 98.6 F (37 C) (Oral)  Resp 16  SpO2 100%  Physical Exam  Constitutional: She appears well-developed and well-nourished. No distress.  HENT:  Head: Normocephalic and atraumatic.  Right Ear: External ear normal.  Left Ear: External ear normal.  Eyes: Conjunctivae are normal. Right eye exhibits no discharge. Left eye exhibits no discharge. No scleral icterus.  Neck: Neck supple. No tracheal deviation present.  Cardiovascular: Normal rate.   Pulmonary/Chest: Effort normal. No stridor. No respiratory distress.  Abdominal:  Prolapsed colostomy in colostomy bag, tissue is pink and well perfused  Musculoskeletal: She exhibits no edema.  Neurological: She is alert. Cranial nerve deficit: no gross deficits.  Skin: Skin is warm and dry. No rash noted.  Psychiatric: She has a normal mood and affect.  Nursing note and vitals reviewed.   ED Course  Procedures Applied soft gentle pressure to the prolapse.  Able to reduce the size significantly with constant pressure.  Will apply an ice pack and attempt additional reduction. After several attempts, able to reduce by approximately 75 to 80 % but still not completely reduced. MDM Recurrent prolapsed colostomy.  Plan on surgical consultation.      Dorie Rank, MD 03/04/15 1340

## 2015-03-02 NOTE — Discharge Instructions (Signed)
Please call your general surgeon to have close follow-up and discuss potential revision of her colostomy. Return without fail for worsening symptoms including vomiting, fever, abdominal pain, or any other symptoms concerning to you.  Colostomy Home Guide A colostomy is an opening for stool to leave your body when a medical condition prevents it from leaving through the usual opening (rectum). During a surgery, a piece of large intestine (colon) is brought through a hole in the abdominal wall. The new opening is called a stoma or ostomy. A bag or pouch fits over the stoma to catch stool and gas. Your stool may be liquid, somewhat pasty, or formed. CARING FOR YOUR STOMA  Normally, the stoma looks a lot like the inside of your cheek: pink, red, and moist. At first it may be swollen, but this swelling will decrease within 6 weeks. Keep the skin around your stoma clean and dry. You can gently wash your stoma and the skin around your stoma in the shower with a clean, soft washcloth. If you develop any skin irritation, your caregiver may give you a stoma powder or ointment to help heal the area. Do not use any products other than those specifically given to you by your caregiver.  Your stoma should not be uncomfortable. If you notice any stinging or burning, your pouch may be leaking, and the skin around your stoma may be coming into contact with stool. This can cause skin irritation. If you notice stinging, replace your pouch with a new one and discard the old one. OSTOMY POUCHES  The pouch that fits over the ostomy can be made up of either 1 or 2 pieces. A one-piece pouch has a skin barrier piece and the pouch itself in one unit. A two-piece pouch has a skin barrier with a separate pouch that snaps on and off of the skin barrier. Either way, you should empty the pouch when it is only  to  full. Do not let more stool or gas build up. This could cause the pouch to leak. Some ostomy bags have a built-in gas  release valve. Ostomy deodorizer (5 drops) can be put into the pouch to prevent odor. Some people use ostomy lubricant drops inside the pouch to help the stool slide out of the bag more easily and completely.  EMPTYING YOUR OSTOMY POUCH  You may get lessons on how to empty your pouch from a wound-ostomy nurse before you leave the hospital. Here are the basic steps:  Wash your hands with soap and water.  Sit far back on the toilet.  Put several pieces of toilet paper into the toilet water. This will prevent splashing as you empty the stool into the toilet bowl.  Unclip or unvelcro the tail end of the pouch.  Unroll the tail and empty stool into the toilet.  Clean the tail with toilet paper.  Reroll the tail, and clip or velcro it closed.  Wash your hands again. CHANGING YOUR OSTOMY POUCH  Change your ostomy pouch about every 3 to 4 days for the first 6 weeks, then every 5 to7 days. Always change the bag sooner if there is any leakage or you begin to notice any discomfort or irritation of the skin around the stoma. When possible, plan to change your ostomy pouch before eating or drinking as this will lessen the chance of stool coming out during the pouch change. A wound-ostomy nurse may teach you how to change your pouch before you leave the hospital. Here are  the basic steps:  Hoyle Barr out your supplies.  Wash your hands with soap and water.  Carefully remove the old pouch.  Wash the stoma and allow it to dry. Men may be advised to shave any hair around the stoma very carefully. This will make the adhesive stick better.  Use the stoma measuring guide that comes with your pouch set to decide what size hole you will need to cut in the skin barrier piece. Choose the smallest possible size that will hold the stoma but will not touch it.  Use the guide to trace the circle on the back of the skin barrier piece. Cut out the hole.  Hold the skin barrier piece over the stoma to make sure the hole  is the correct size.  Remove the adhesive paper backing from the skin barrier piece.  Squeeze stoma paste around the opening of the skin barrier piece.  Clean and dry the skin around the stoma again.  Carefully fit the skin barrier piece over your stoma.  If you are using a two-piece pouch, snap the pouch onto the skin barrier piece.  Close the tail of the pouch.  Put your hand over the top of the skin barrier piece to help warm it for about 5 minutes, so that it conforms to your body better.  Wash your hands again. DIET TIPS   Continue to follow your usual diet.  Drink about eight 8 oz glasses of water each day.  You can prevent gas by eating slowly and chewing your food thoroughly.  If you feel concerned that you have too much gas, you can cut back on gas-producing foods, such as:  Spicy foods.  Onions and garlic.  Cruciferous vegetables (cabbage, broccoli, cauliflower, Brussels sprouts).  Beans and legumes.  Some cheeses.  Eggs.  Fish.  Bubbly (carbonated) drinks.  Chewing gum. GENERAL TIPS   You can shower with or without the bag in place.  Always keep the bag on if you are bathing or swimming.  If your bag gets wet, you can dry it with a blow-dryer set to cool.  Avoid wearing tight clothing directly over your stoma so that it does not become irritated or bleed. Tight clothing can also prevent stool from draining into the pouch.  It is helpful to always have an extra skin barrier and pouch with you when traveling. Do not leave them anywhere too warm, as parts of them can melt.  Do not let your seat belt rest on your stoma. Try to keep the seat belt either above or below your stoma, or use a tiny pillow to cushion it.  You can still participate in sports, but you should avoid activities in which there is a risk of getting hit in the abdomen.  You can still have sex. It is a good idea to empty your pouch prior to sex. Some people and their partners feel  very comfortable seeing the pouch during sex. Others choose to wear lingerie or a T-shirt that covers the device. SEEK IMMEDIATE MEDICAL CARE IF:  You notice a change in the size or color of the stoma, especially if it becomes very red, purple, black, or pale white.  You have bloody stools or bleeding from the stoma.  You have abdominal pain, nausea, vomiting, or bloating.  There is anything unusual protruding from the stoma.  You have irritation or red skin around the stoma.  No stool is passing from the stoma.  You have diarrhea (requiring more frequent  than normal pouch emptying). Document Released: 06/24/2003 Document Revised: 09/13/2011 Document Reviewed: 11/18/2010 [facility] Patient Information 2015 [place], [place]. This information is not intended to replace advice given to you by your health care provider. Make sure you discuss any questions you have with your health care provider.

## 2015-03-02 NOTE — ED Notes (Signed)
She c/o abd. Pain.  She states she has hx of cancer.  Her daughter is with her. She is in no distress.

## 2015-03-02 NOTE — ED Notes (Signed)
She is happily visiting with two of her family members; and I re-pack her ice pack and replace it onto her ostomy.  He ostomy is pink and healthy-looking.

## 2015-03-02 NOTE — ED Provider Notes (Signed)
CSN: [id number]     Arrival date & time 03/02/15  1130 History   First MD Initiated Contact with Patient 03/02/15 1136     No chief complaint on file.   HPI  [name] [name] is a 64yo female presenting for significant abdominal pain with pain protruding out of colostomy sight. First noted prolapse this morning. Pain located diffusely over abdominal, worse at colostomy site. Unable to reduce back through colostomy site. Also notes nausea.  Chart review shows recent visit to ED on 8/20, where she complained of acute abdominal pain surrounding colostomy site. Was noted to have new bowel protruding from site. Was given IV pain medication and muscle relaxers. Dr. Zella Richer was contacted to reduce and she was discharged home.  Past Medical History  Diagnosis Date  . Colon cancer     dx'd 2014  . Heart murmur     ? as a child   . Pulmonary embolism 11/2014   . Anemia     hx of    Past Surgical History  Procedure Laterality Date  . Abdominal hysterectomy  2005  . Cesarean section  1976  . Flexible sigmoidoscopy N/A 05/25/2013    Procedure: FLEXIBLE SIGMOIDOSCOPY;  Surgeon: [name], MD;  [facility];  Service: Endoscopy;  Laterality: N/A;  needs floro  . Colonic stent placement N/A 05/25/2013    Procedure: COLONIC STENT PLACEMENT;  Surgeon: [name], MD;  [facility];  Service: Endoscopy;  Laterality: N/A;  . Portacath placement Right 06/25/2013    Procedure: ULTRA SOUND GUIDED INSERTION PORT-A-CATH;  Surgeon: [name], MD;  Location: [place];  Service: General;  Laterality: Right;  . Flexible sigmoidoscopy N/A 08/19/2014    Procedure: FLEXIBLE SIGMOIDOSCOPY;  Surgeon: Gatha Mayer, MD;  Location: [facility];  Service: Endoscopy;  Laterality: N/A;  . Colonic stent placement N/A 08/19/2014    Procedure: COLONIC STENT PLACEMENT;  Surgeon: Gatha Mayer, MD;  [facility];  Service: Endoscopy;  Laterality: N/A;  . Flexible  sigmoidoscopy N/A 08/19/2014    Procedure: FLEXIBLE SIGMOIDOSCOPY;  Surgeon: Gatha Mayer, MD;  [facility];  Service: Endoscopy;  Laterality: N/A;  . Colonic stent placement N/A 08/19/2014    Procedure: COLONIC STENT PLACEMENT;  Surgeon: Gatha Mayer, MD;  [facility];  Service: Endoscopy;  Laterality: N/A;  . Colon resection N/A 10/28/2014    Procedure: Transverse loop colostomy;  Surgeon: [name], MD;  [facility];  Service: General;  Laterality: N/A;  . Colostomy revision N/A 01/03/2015    Procedure: REVISION OF TRANSVERSE LOOP COLOSTOMY WITH PARTIAL  COLECTOMY;  Surgeon: [name], MD;  Location: [facility];  Service: General;  Laterality: N/A;   Family History  Problem Relation Age of Onset  . Colon cancer Sister 33  . Diabetes Neg Hx   . Thyroid disease Neg Hx   . Breast cancer Maternal Aunt   . Prostate cancer Maternal Uncle   . Bone cancer Maternal Grandfather   . Prostate cancer Maternal Uncle    Social History  Substance Use Topics  . Smoking status: Former Smoker -- 1 years    Types: Cigarettes    Quit date: 05/24/1971  . Smokeless tobacco: Never Used  . Alcohol Use: No   OB History    No data available     Review of Systems  Constitutional: Negative for fever.  Gastrointestinal: Positive for nausea and abdominal pain.      Allergies  Oxycontin and Codeine  Home Medications   Prior to Admission medications   Medication Sig Start Date End Date Taking? Authorizing Provider  acetaminophen (TYLENOL) 500 MG tablet Take 500 mg by mouth every 6 (six) hours as needed for moderate pain or headache.     Historical Provider, MD  Cyanocobalamin (VITAMIN B 12 PO) Take 1 tablet by mouth every morning.    Historical Provider, MD  HYDROmorphone (DILAUDID) 2 MG tablet Take 1 tablet (2 mg total) by mouth every 4 (four) hours as needed for severe pain. 01/07/15   [name], MD  IRON PO Take 1 tablet by mouth every morning.    Historical  Provider, MD  lidocaine-prilocaine (EMLA) cream Apply 1 application topically as needed. Apply to port site one hour before treatment and cover with plastic wrap. 07/02/13   Concha Norway, MD  LORazepam (ATIVAN) 0.5 MG tablet Take 1 tablet (0.5 mg total) by mouth every 8 (eight) hours as needed (nausea/vomiting). 04/22/14   Aasim Lona Kettle, MD  ondansetron (ZOFRAN) 8 MG tablet Take 8mg  by mouth every 12 hours as needed for nausea/vomiting.  Take 8 mg by mouth  30 minutes before taking chemo pills, and as needed for nausea/vomiting. 07/15/14   Truitt Merle, MD  polyethylene glycol powder (GLYCOLAX/MIRALAX) powder TAKE 34 G (2 DOSES) BY MOUTH DAILY AS NEEDED FOR MILD CONSTIPATION OR MODERATE CONSTIPATION 03/03/14   Concha Norway, MD  prochlorperazine (COMPAZINE) 10 MG tablet TAKE 1 TABLET (10 MG TOTAL) BY MOUTH EVERY 6 (SIX) HOURS AS NEEDED (NAUSEA OR VOMITING). 01/01/14   Concha Norway, MD  rivaroxaban (XARELTO) 20 MG TABS tablet Take 1 tablet (20 mg total) by mouth daily with supper. 02/13/15   Truitt Merle, MD   There were no vitals taken for this visit. Physical Exam  Constitutional: She appears well-developed and well-nourished. No distress.  Cardiovascular: Normal rate and regular rhythm.  Exam reveals no gallop and no friction rub.   No murmur heard. Pulmonary/Chest: Effort normal. No respiratory distress. She has no wheezes. She has no rales.  Abdominal: Soft. Bowel sounds are normal.  Diffuse abdominal pain, worse under colostomy site. Bowel protruding into colostomy bag.  Musculoskeletal: She exhibits no edema.  Neurological: She is alert.  Skin: Skin is warm and dry. No rash noted.  Psychiatric: She has a normal mood and affect. Her behavior is normal.    ED Course  Procedures (including critical care time) Labs Review Labs Reviewed - No data to display  Imaging Review No results found. I have personally reviewed and evaluated these images and lab results as part of my medical decision-making.    EKG Interpretation None      MDM   Final diagnoses:  None  BMP with mild hyperchloremia 113. CBC with anemia of 9.6 improved from prior. Fentanyl given for pain. Ice applied to colostomy site. Was able to reduce bowel 75% through site with improvement in pain, however there was still a small portion that was non-reducible. General Surgery contacted to attempt reduction and give recommendation. Prolapse reduced. Stable for discharge with surgery follow up.    [place], [place] 03/10/15 1208

## 2015-03-02 NOTE — Consult Note (Addendum)
Re:   [name] [name] DOB:   August 06, 1950 MRN:   [id number]  ASSESSMENT AND PLAN: 1.  Prolapsed loop colostomy  [photo under me PE or "media" in Epic]  Last revised 01/03/2015.  It is better now, not hurting, and she is having stool through the ostomy.  She wants Dr. Zella Richer to manage this.  She is to call our office tomorrow to arrange follow up with Dr. Zella Richer.  2.  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 3.  Lung nodule 4.  History of RLL PE in 11/2014  On Xarelto 5.  Anemia   Hgb - 9.6 - 03/02/2015  Chief Complaint  Patient presents with  . Abdominal Pain   REFERRING PHYSICIAN: [facility]  HISTORY OF PRESENT ILLNESS: [name] [name] is a 64 y.o. (DOB: March 01, 1951)  AA   female whose primary care physician is [facility].  She has had recurrent prolapsing loop ostomy. Her daughter, Sharyn Lull, is with her.  She came to the [facility] ER today when she developed pain and some blood with worsening of her prolapsed ostomy.  The ER has worked to partially reduced this.  Her pain is gone and she is having BM's.  She had a transverse loop colostomy for obstructing stage IV colon cancer by Dr. Zella Richer 10/28/2014.  He did a revision of the loop colostomy with partial colectomy on 01/03/2015.  He saw in the [facility] on 02/22/2015 for another prolapse of her loop colostomy.  Her last abdominal CT scan was 11/13/2014 (prior to colostomy revision) showing a lot of prolapsing colon.  Dr. Burr Medico is her treating oncologist.  She has received FOLFOX and FOLFIRI.   She has also had capacitain.    Her CEA on 12/13/2014 was 10.9.   It was as high as 286 on 07/23/2013.  It was last in the normal range - 4.1 on 09/13/2014. At this time, she is not on chemotx.    Past Medical History  Diagnosis Date  . Colon cancer     dx'd 2014  . Heart murmur     ? as a child   . Pulmonary embolism 11/2014   . Anemia     hx of       Past Surgical History    Procedure Laterality Date  . Abdominal hysterectomy  2005  . Cesarean section  1976  . Flexible sigmoidoscopy N/A 05/25/2013    Procedure: FLEXIBLE SIGMOIDOSCOPY;  Surgeon: [name], MD;  Location: [facility];  Service: Endoscopy;  Laterality: N/A;  needs floro  . Colonic stent placement N/A 05/25/2013    Procedure: COLONIC STENT PLACEMENT;  Surgeon: [name], MD;  Location[facility];  Service: Endoscopy;  Laterality: N/A;  . Portacath placement Right 06/25/2013    Procedure: ULTRA SOUND GUIDED INSERTION PORT-A-CATH;  Surgeon: [name], MD;  Location: [place];  Service: General;  Laterality: Right;  . Flexible sigmoidoscopy N/A 08/19/2014    Procedure: FLEXIBLE SIGMOIDOSCOPY;  Surgeon: Gatha Mayer, MD;  Location: [facility];  Service: Endoscopy;  Laterality: N/A;  . Colonic stent placement N/A 08/19/2014    Procedure: COLONIC STENT PLACEMENT;  Surgeon: Gatha Mayer, MD;  [facility];  Service: Endoscopy;  Laterality: N/A;  . Flexible sigmoidoscopy N/A 08/19/2014    Procedure: FLEXIBLE SIGMOIDOSCOPY;  Surgeon: Gatha Mayer, MD;  [facility];  Service: Endoscopy;  Laterality: N/A;  . Colonic  stent placement N/A 08/19/2014    Procedure: COLONIC STENT PLACEMENT;  Surgeon: Gatha Mayer, MD;  [facility];  Service: Endoscopy;  Laterality: N/A;  . Colon resection N/A 10/28/2014    Procedure: Transverse loop colostomy;  Surgeon: [name], MD;  [facility];  Service: General;  Laterality: N/A;  . Colostomy revision N/A 01/03/2015    Procedure: REVISION OF TRANSVERSE LOOP COLOSTOMY WITH PARTIAL  COLECTOMY;  Surgeon: [name], MD;  [facility];  Service: General;  Laterality: N/A;      No current facility-administered medications for this encounter.   Current Outpatient Prescriptions  Medication Sig Dispense Refill  . acetaminophen (TYLENOL) 500 MG tablet Take 500 mg by mouth every 6 (six)  hours as needed for moderate pain or headache.     . Cyanocobalamin (VITAMIN B 12 PO) Take 1 tablet by mouth every morning.    Marland Kitchen HYDROmorphone (DILAUDID) 2 MG tablet Take 1 tablet (2 mg total) by mouth every 4 (four) hours as needed for severe pain. 40 tablet 0  . IRON PO Take 1 tablet by mouth every morning.    . lidocaine-prilocaine (EMLA) cream Apply 1 application topically as needed. Apply to port site one hour before treatment and cover with plastic wrap. 30 g 3  . LORazepam (ATIVAN) 0.5 MG tablet Take 1 tablet (0.5 mg total) by mouth every 8 (eight) hours as needed (nausea/vomiting). 60 tablet 0  . ondansetron (ZOFRAN) 8 MG tablet Take 8mg  by mouth every 12 hours as needed for nausea/vomiting.  Take 8 mg by mouth  30 minutes before taking chemo pills, and as needed for nausea/vomiting. 30 tablet 1  . polyethylene glycol powder (GLYCOLAX/MIRALAX) powder TAKE 34 G (2 DOSES) BY MOUTH DAILY AS NEEDED FOR MILD CONSTIPATION OR MODERATE CONSTIPATION 255 g 0  . PRESCRIPTION MEDICATION CHEMO [facility]    . prochlorperazine (COMPAZINE) 10 MG tablet TAKE 1 TABLET (10 MG TOTAL) BY MOUTH EVERY 6 (SIX) HOURS AS NEEDED (NAUSEA OR VOMITING). 30 tablet 1  . rivaroxaban (XARELTO) 20 MG TABS tablet Take 1 tablet (20 mg total) by mouth daily with supper. 30 tablet 3      Allergies  Allergen Reactions  . Oxycontin [Oxycodone Hcl] Anaphylaxis, Hives and Itching  . Codeine Itching and Nausea And Vomiting    REVIEW OF SYSTEMS: Skin:  No history of rash.  No history of abnormal moles. Infection:  No history of hepatitis or HIV.  No history of MRSA. Neurologic:  No history of stroke.  No history of seizure.  No history of headaches. Cardiac:  No history of hypertension. No history of heart disease.  No history of prior cardiac catheterization.  No history of seeing a cardiologist. Pulmonary:  History of PE in 11/2014  Endocrine:  No diabetes. No thyroid disease. Gastrointestinal:  See HPI.  Her weight has been  stable the last year. Urologic:  No history of kidney stones.  No history of bladder infections. Musculoskeletal:  No history of joint or back disease. Hematologic:  Anemic.  On Xarelto. Psycho-social:  The patient is oriented.   The patient has no obvious psychologic or social impairment to understanding our conversation and plan.  SOCIAL and FAMILY HISTORY: Married. Husband, [name]. She has two daughters and one son.  Her daughter, Sharyn Lull, is with her.  PHYSICAL EXAM: BP 109/60 mmHg  Pulse 86  Temp(Src) 98.6 F (37 C) (Oral)  Resp 18  SpO2 98%  General: WN AA F who is alert. HEENT: Normal.  Pupils equal. Neck: Supple. No mass.  No thyroid mass. Lymph Nodes:  No supraclavicular or cervical nodes. Lungs: Clear to auscultation and symmetric breath sounds. Heart:  RRR. No murmur or rub. Abdomen: Soft. Prolapsed RUQ ostomy (about 13 cm).  It is soft.  No tenderness.  Stool is coming out of ostomy.  Extremities:  Good strength and ROM  in upper and lower extremities. Neurologic:  Grossly intact to motor and sensory function. Psychiatric: Has normal mood and affect. Behavior is normal.    Post reduction of prolapse.  There is a picture in Epic of the ostomy prior to reduction.  DATA REVIEWED: Epic notes  Alphonsa Overall, MD,  [facility], [place] [facility] [place]  [place], [place]    [place] Phone:  [phone] FAX:  [phone]

## 2015-03-02 NOTE — ED Provider Notes (Signed)
Please see previous physicians note regarding patient's presenting history and physical, initial ED course, and associated MDM. In short this is a 64 year old female with history of metastatic colon cancer who presents with a prolapsed colostomy. At time of sign out, some small bowel has been partially reduced, it cannot be fully reduced. Gen. surgery has been consult head, eyes and was not fully able to reduce all of the small bowel, but patient was having normal bowel movements through this and has overall benign abdomen. She was felt to be stable for discharge home by general surgery, and she will follow up with her surgeon, Dr. Barkley Bruns for revision. Strict return and follow-up instructions are reviewed. She expressed understanding of all discharge instructions for comfortable to plan of care.  Forde Dandy, MD 03/02/15 [phone]

## 2015-03-02 NOTE — ED Notes (Signed)
Pt from home c/o abdominal pain and headache. Pt seen here earlier today for colostomy issues.

## 2015-03-03 ENCOUNTER — Encounter (HOSPITAL_COMMUNITY): Payer: Self-pay | Admitting: Radiology

## 2015-03-03 ENCOUNTER — Inpatient Hospital Stay (HOSPITAL_COMMUNITY): Payer: Medicaid Other

## 2015-03-03 DIAGNOSIS — K561 Intussusception: Secondary | ICD-10-CM | POA: Diagnosis present

## 2015-03-03 DIAGNOSIS — K635 Polyp of colon: Secondary | ICD-10-CM | POA: Diagnosis present

## 2015-03-03 DIAGNOSIS — Z86711 Personal history of pulmonary embolism: Secondary | ICD-10-CM

## 2015-03-03 DIAGNOSIS — Z885 Allergy status to narcotic agent status: Secondary | ICD-10-CM | POA: Diagnosis not present

## 2015-03-03 DIAGNOSIS — K435 Parastomal hernia without obstruction or  gangrene: Secondary | ICD-10-CM | POA: Diagnosis present

## 2015-03-03 DIAGNOSIS — C19 Malignant neoplasm of rectosigmoid junction: Secondary | ICD-10-CM | POA: Diagnosis present

## 2015-03-03 DIAGNOSIS — Z7901 Long term (current) use of anticoagulants: Secondary | ICD-10-CM | POA: Diagnosis not present

## 2015-03-03 DIAGNOSIS — K9409 Other complications of colostomy: Secondary | ICD-10-CM | POA: Diagnosis present

## 2015-03-03 DIAGNOSIS — Z23 Encounter for immunization: Secondary | ICD-10-CM | POA: Diagnosis not present

## 2015-03-03 DIAGNOSIS — D6481 Anemia due to antineoplastic chemotherapy: Secondary | ICD-10-CM | POA: Diagnosis present

## 2015-03-03 DIAGNOSIS — C787 Secondary malignant neoplasm of liver and intrahepatic bile duct: Secondary | ICD-10-CM

## 2015-03-03 DIAGNOSIS — F419 Anxiety disorder, unspecified: Secondary | ICD-10-CM | POA: Diagnosis present

## 2015-03-03 DIAGNOSIS — Y838 Other surgical procedures as the cause of abnormal reaction of the patient, or of later complication, without mention of misadventure at the time of the procedure: Secondary | ICD-10-CM | POA: Diagnosis present

## 2015-03-03 DIAGNOSIS — D500 Iron deficiency anemia secondary to blood loss (chronic): Secondary | ICD-10-CM

## 2015-03-03 DIAGNOSIS — Z79899 Other long term (current) drug therapy: Secondary | ICD-10-CM | POA: Diagnosis not present

## 2015-03-03 DIAGNOSIS — T451X5A Adverse effect of antineoplastic and immunosuppressive drugs, initial encounter: Secondary | ICD-10-CM | POA: Diagnosis present

## 2015-03-03 DIAGNOSIS — Z803 Family history of malignant neoplasm of breast: Secondary | ICD-10-CM | POA: Diagnosis not present

## 2015-03-03 DIAGNOSIS — Z90710 Acquired absence of both cervix and uterus: Secondary | ICD-10-CM | POA: Diagnosis not present

## 2015-03-03 DIAGNOSIS — Z808 Family history of malignant neoplasm of other organs or systems: Secondary | ICD-10-CM | POA: Diagnosis not present

## 2015-03-03 DIAGNOSIS — D62 Acute posthemorrhagic anemia: Secondary | ICD-10-CM | POA: Diagnosis present

## 2015-03-03 DIAGNOSIS — R911 Solitary pulmonary nodule: Secondary | ICD-10-CM | POA: Diagnosis present

## 2015-03-03 DIAGNOSIS — R000 Tachycardia, unspecified: Secondary | ICD-10-CM | POA: Diagnosis not present

## 2015-03-03 DIAGNOSIS — Z800 Family history of malignant neoplasm of digestive organs: Secondary | ICD-10-CM | POA: Diagnosis not present

## 2015-03-03 LAB — HEPARIN LEVEL (UNFRACTIONATED)
HEPARIN UNFRACTIONATED: 0.26 [IU]/mL — AB (ref 0.30–0.70)
Heparin Unfractionated: 0.21 IU/mL — ABNORMAL LOW (ref 0.30–0.70)

## 2015-03-03 LAB — APTT
aPTT: 32 seconds (ref 24–37)
aPTT: 36 seconds (ref 24–37)

## 2015-03-03 LAB — PREALBUMIN: Prealbumin: 26.9 mg/dL (ref 18–38)

## 2015-03-03 MED ORDER — HEPARIN (PORCINE) IN NACL 100-0.45 UNIT/ML-% IJ SOLN
1200.0000 [IU]/h | INTRAMUSCULAR | Status: DC
  Administered 2015-03-03: 1200 [IU]/h via INTRAVENOUS
  Filled 2015-03-03 (×3): qty 250

## 2015-03-03 MED ORDER — HEPARIN SODIUM (PORCINE) 5000 UNIT/ML IJ SOLN: 5000.0000 [IU] | Freq: Once | INTRAMUSCULAR | Status: DC

## 2015-03-03 MED ORDER — PHENOL 1.4 % MT LIQD
2.0000 | OROMUCOSAL | Status: DC | PRN
  Filled 2015-03-03: qty 177

## 2015-03-03 MED ORDER — IOHEXOL 300 MG/ML  SOLN
100.0000 mL | Freq: Once | INTRAMUSCULAR | Status: AC | PRN
  Administered 2015-03-03: 100 mL via INTRAVENOUS

## 2015-03-03 MED ORDER — BUPIVACAINE LIPOSOME 1.3 % IJ SUSP
20.0000 mL | INTRAMUSCULAR | Status: AC
  Filled 2015-03-03: qty 20

## 2015-03-03 MED ORDER — POLYETHYLENE GLYCOL 3350 17 G PO PACK
34.0000 g | PACK | Freq: Two times a day (BID) | ORAL | Status: DC
  Administered 2015-03-03 – 2015-03-06 (×6): 34 g via ORAL
  Filled 2015-03-03: qty 2

## 2015-03-03 MED ORDER — HEPARIN (PORCINE) IN NACL 100-0.45 UNIT/ML-% IJ SOLN
1400.0000 [IU]/h | INTRAMUSCULAR | Status: AC
  Administered 2015-03-04 – 2015-03-05 (×3): 1400 [IU]/h via INTRAVENOUS
  Filled 2015-03-03 (×3): qty 250

## 2015-03-03 MED ORDER — METRONIDAZOLE 500 MG PO TABS
1000.0000 mg | ORAL_TABLET | ORAL | Status: AC
  Administered 2015-03-05 (×3): 1000 mg via ORAL
  Filled 2015-03-03 (×3): qty 2

## 2015-03-03 MED ORDER — CHLORHEXIDINE GLUCONATE 4 % EX LIQD
60.0000 mL | Freq: Once | CUTANEOUS | Status: AC
  Administered 2015-03-05: 4 via TOPICAL
  Filled 2015-03-03 (×2): qty 60

## 2015-03-03 MED ORDER — DEXTROSE-NACL 5-0.45 % IV SOLN
INTRAVENOUS | Status: AC
  Administered 2015-03-03: 02:00:00 via INTRAVENOUS

## 2015-03-03 MED ORDER — NEOMYCIN SULFATE 500 MG PO TABS
1000.0000 mg | ORAL_TABLET | ORAL | Status: AC
  Administered 2015-03-05 (×3): 1000 mg via ORAL
  Filled 2015-03-03 (×3): qty 2

## 2015-03-03 MED ORDER — METHOCARBAMOL 500 MG PO TABS
500.0000 mg | ORAL_TABLET | Freq: Four times a day (QID) | ORAL | Status: DC
  Administered 2015-03-03 – 2015-03-06 (×13): 500 mg via ORAL
  Filled 2015-03-03 (×20): qty 1

## 2015-03-03 MED ORDER — GENTAMICIN SULFATE 40 MG/ML IJ SOLN
INTRAMUSCULAR | Status: DC
  Filled 2015-03-03: qty 6

## 2015-03-03 MED ORDER — MENTHOL 3 MG MT LOZG
1.0000 | LOZENGE | OROMUCOSAL | Status: DC | PRN
  Filled 2015-03-03: qty 9

## 2015-03-03 MED ORDER — PROMETHAZINE HCL 25 MG/ML IJ SOLN: 6.2500 mg | INTRAMUSCULAR | Status: DC | PRN

## 2015-03-03 MED ORDER — CHLORHEXIDINE GLUCONATE 4 % EX LIQD
60.0000 mL | Freq: Once | CUTANEOUS | Status: DC
  Filled 2015-03-03: qty 60

## 2015-03-03 MED ORDER — CETYLPYRIDINIUM CHLORIDE 0.05 % MT LIQD
7.0000 mL | Freq: Two times a day (BID) | OROMUCOSAL | Status: DC
  Administered 2015-03-03 – 2015-03-12 (×16): 7 mL via OROMUCOSAL

## 2015-03-03 MED ORDER — IOHEXOL 300 MG/ML  SOLN
25.0000 mL | INTRAMUSCULAR | Status: AC
Start: 2015-03-03 — End: 2015-03-03
  Administered 2015-03-03 (×2): 25 mL via ORAL

## 2015-03-03 MED ORDER — METOPROLOL TARTRATE 1 MG/ML IV SOLN
5.0000 mg | Freq: Four times a day (QID) | INTRAVENOUS | Status: DC | PRN
  Administered 2015-03-06: 5 mg via INTRAVENOUS
  Filled 2015-03-03 (×2): qty 5

## 2015-03-03 MED ORDER — LACTATED RINGERS IV BOLUS (SEPSIS): 1000.0000 mL | Freq: Three times a day (TID) | INTRAVENOUS | Status: AC | PRN

## 2015-03-03 MED ORDER — FENTANYL CITRATE (PF) 100 MCG/2ML IJ SOLN
50.0000 ug | Freq: Once | INTRAMUSCULAR | Status: AC
  Administered 2015-03-03: 50 ug via INTRAMUSCULAR
  Filled 2015-03-03: qty 2

## 2015-03-03 MED ORDER — ALUM & MAG HYDROXIDE-SIMETH 200-200-20 MG/5ML PO SUSP: 30.0000 mL | Freq: Four times a day (QID) | ORAL | Status: DC | PRN

## 2015-03-03 MED ORDER — DIPHENHYDRAMINE HCL 50 MG/ML IJ SOLN
12.5000 mg | Freq: Four times a day (QID) | INTRAMUSCULAR | Status: DC | PRN
  Administered 2015-03-04 – 2015-03-05 (×3): 25 mg via INTRAVENOUS
  Filled 2015-03-03 (×3): qty 1

## 2015-03-03 MED ORDER — ONDANSETRON HCL 4 MG/2ML IJ SOLN
4.0000 mg | Freq: Four times a day (QID) | INTRAMUSCULAR | Status: DC | PRN
  Administered 2015-03-06: 4 mg via INTRAVENOUS

## 2015-03-03 MED ORDER — ALVIMOPAN 12 MG PO CAPS
12.0000 mg | ORAL_CAPSULE | Freq: Once | ORAL | Status: AC
  Administered 2015-03-06: 12 mg via ORAL
  Filled 2015-03-03: qty 1

## 2015-03-03 MED ORDER — DEXTROSE 5 % IV SOLN
2.0000 g | INTRAVENOUS | Status: AC
  Administered 2015-03-06: 2 g via INTRAVENOUS

## 2015-03-03 MED ORDER — MORPHINE SULFATE (PF) 4 MG/ML IV SOLN
4.0000 mg | INTRAVENOUS | Status: DC | PRN
  Administered 2015-03-03 – 2015-03-06 (×14): 4 mg via INTRAVENOUS
  Filled 2015-03-03 (×14): qty 1

## 2015-03-03 MED ORDER — DIAZEPAM 5 MG/ML IJ SOLN
2.5000 mg | Freq: Once | INTRAMUSCULAR | Status: AC
  Administered 2015-03-03: 2.5 mg via INTRAVENOUS
  Filled 2015-03-03: qty 2

## 2015-03-03 MED ORDER — MAGIC MOUTHWASH
15.0000 mL | Freq: Four times a day (QID) | ORAL | Status: DC | PRN
  Filled 2015-03-03: qty 15

## 2015-03-03 MED ORDER — LIP MEDEX EX OINT
1.0000 "application " | TOPICAL_OINTMENT | Freq: Two times a day (BID) | CUTANEOUS | Status: DC
  Administered 2015-03-03 – 2015-03-11 (×16): 1 via TOPICAL
  Filled 2015-03-03 (×4): qty 7

## 2015-03-03 MED ORDER — SODIUM CHLORIDE 0.9 % IV SOLN
8.0000 mg | Freq: Four times a day (QID) | INTRAVENOUS | Status: DC | PRN
  Filled 2015-03-03: qty 4

## 2015-03-03 NOTE — ED Provider Notes (Signed)
CSN: [id number]     Arrival date & time 03/02/15  2328 History   First MD Initiated Contact with Patient 03/03/15 0003     Chief Complaint  Patient presents with  . Abdominal Pain     (Consider location/radiation/quality/duration/timing/severity/associated sxs/prior Treatment) HPI Comments: C64-year-old female with a history of colon cancer and colostomy has been seen several times today for valve prolapse returned with similar symptoms, stating a center.  She stood up at home.  The bowel.  Again prolapsed through the ostomy.  She is still making liquid stool, but she is having significant abdominal pain  Patient is a 64 y.o. female presenting with abdominal pain. The history is provided by the patient.  Abdominal Pain Pain location:  LLQ Pain quality: throbbing   Pain radiates to:  Does not radiate Pain severity:  Moderate Onset quality:  Gradual Timing:  Constant Progression:  Worsening Chronicity:  New Associated symptoms: no fever, no nausea and no vomiting     Past Medical History  Diagnosis Date  . Colon cancer     dx'd 2014  . Heart murmur     ? as a child   . Pulmonary embolism 11/2014   . Anemia     hx of    Past Surgical History  Procedure Laterality Date  . Abdominal hysterectomy  2005  . Cesarean section  1976  . Flexible sigmoidoscopy N/A 05/25/2013    Procedure: FLEXIBLE SIGMOIDOSCOPY;  Surgeon: [name], MD;  [facility];  Service: Endoscopy;  Laterality: N/A;  needs floro  . Colonic stent placement N/A 05/25/2013    Procedure: COLONIC STENT PLACEMENT;  Surgeon: [name], MD;  [facility];  Service: Endoscopy;  Laterality: N/A;  . Portacath placement Right 06/25/2013    Procedure: ULTRA SOUND GUIDED INSERTION PORT-A-CATH;  Surgeon: [name], MD;  Location: [place];  Service: General;  Laterality: Right;  . Flexible sigmoidoscopy N/A 08/19/2014    Procedure: FLEXIBLE SIGMOIDOSCOPY;  Surgeon: Gatha Mayer, MD;  Location[facility];  Service: Endoscopy;  Laterality: N/A;  . Colonic stent placement N/A 08/19/2014    Procedure: COLONIC STENT PLACEMENT;  Surgeon: Gatha Mayer, MD;  [facility];  Service: Endoscopy;  Laterality: N/A;  . Flexible sigmoidoscopy N/A 08/19/2014    Procedure: FLEXIBLE SIGMOIDOSCOPY;  Surgeon: Gatha Mayer, MD;  [facility];  Service: Endoscopy;  Laterality: N/A;  . Colonic stent placement N/A 08/19/2014    Procedure: COLONIC STENT PLACEMENT;  Surgeon: Gatha Mayer, MD;  [facility];  Service: Endoscopy;  Laterality: N/A;  . Colon resection N/A 10/28/2014    Procedure: Transverse loop colostomy;  Surgeon: [name], MD;  [facility];  Service: General;  Laterality: N/A;  . Colostomy revision N/A 01/03/2015    Procedure: REVISION OF TRANSVERSE LOOP COLOSTOMY WITH PARTIAL  COLECTOMY;  Surgeon: [name], MD;  [facility];  Service: General;  Laterality: N/A;   Family History  Problem Relation Age of Onset  . Colon cancer Sister 75  . Diabetes Neg Hx   . Thyroid disease Neg Hx   . Breast cancer Maternal Aunt   . Prostate cancer Maternal Uncle   . Bone cancer Maternal Grandfather   . Prostate cancer Maternal Uncle    Social History  Substance Use Topics  . Smoking status: Former Smoker -- 1 years    Types: Cigarettes    Quit date: 05/24/1971  . Smokeless tobacco: Never Used  .  Alcohol Use: No   OB History    No data available     Review of Systems  Constitutional: Negative for fever.  Gastrointestinal: Positive for abdominal pain. Negative for nausea and vomiting.  All other systems reviewed and are negative.     Allergies  Oxycontin and Codeine  Home Medications   Prior to Admission medications   Medication Sig Start Date End Date Taking? Authorizing Provider  acetaminophen (TYLENOL) 500 MG tablet Take 500 mg by mouth every 6 (six) hours as needed for moderate pain or headache.    Yes  Historical Provider, MD  LORazepam (ATIVAN) 0.5 MG tablet Take 1 tablet (0.5 mg total) by mouth every 8 (eight) hours as needed (nausea/vomiting). 04/22/14  Yes Aasim Lona Kettle, MD  prochlorperazine (COMPAZINE) 10 MG tablet TAKE 1 TABLET (10 MG TOTAL) BY MOUTH EVERY 6 (SIX) HOURS AS NEEDED (NAUSEA OR VOMITING). 01/01/14  Yes Concha Norway, MD  rivaroxaban (XARELTO) 20 MG TABS tablet Take 1 tablet (20 mg total) by mouth daily with supper. 02/13/15  Yes Truitt Merle, MD  Cyanocobalamin (VITAMIN B 12 PO) Take 1 tablet by mouth every morning.    Historical Provider, MD  HYDROmorphone (DILAUDID) 2 MG tablet Take 1-2 tablets (2-4 mg total) by mouth every 4 (four) hours as needed for moderate pain or severe pain. 03/06/15   Michael Boston, MD  IRON PO Take 1 tablet by mouth every morning.    Historical Provider, MD  lidocaine-prilocaine (EMLA) cream Apply 1 application topically as needed. Apply to port site one hour before treatment and cover with plastic wrap. 07/02/13   Concha Norway, MD  ondansetron (ZOFRAN) 8 MG tablet Take 8mg  by mouth every 12 hours as needed for nausea/vomiting.  Take 8 mg by mouth  30 minutes before taking chemo pills, and as needed for nausea/vomiting. 07/15/14   Truitt Merle, MD  polyethylene glycol powder (GLYCOLAX/MIRALAX) powder TAKE 34 G (2 DOSES) BY MOUTH DAILY AS NEEDED FOR MILD CONSTIPATION OR MODERATE CONSTIPATION 03/03/14   Concha Norway, MD  [place]    Historical Provider, MD   BP 119/58 mmHg  Pulse 119  Temp(Src) 99 F (37.2 C) (Axillary)  Resp 24  Ht 5\' 5"  (1.651 m)  Wt 145 lb (65.772 kg)  BMI 24.13 kg/m2  SpO2 100% Physical Exam  Constitutional: She is oriented to person, place, and time. She appears well-developed and well-nourished.  HENT:  Head: Normocephalic.  Eyes: Pupils are equal, round, and reactive to light.  Neck: Normal range of motion.  Cardiovascular: Normal rate.   Pulmonary/Chest: Effort normal.  Abdominal: Soft. She exhibits no  distension. There is tenderness.    Musculoskeletal: Normal range of motion.  Neurological: She is alert and oriented to person, place, and time.  Nursing note and vitals reviewed.   ED Course  Procedures (including critical care time) Labs Review Labs Reviewed  HEPARIN LEVEL (UNFRACTIONATED) - Abnormal; Notable for the following:    Heparin Unfractionated 0.26 (*)    All other components within normal limits  HEMOGLOBIN A1C - Abnormal; Notable for the following:    Hgb A1c MFr Bld 5.9 (*)    All other components within normal limits  COMPREHENSIVE METABOLIC PANEL - Abnormal; Notable for the following:    Glucose, Bld 100 (*)    Calcium 10.5 (*)    Albumin 3.0 (*)    ALT 9 (*)    Total Bilirubin 0.2 (*)    All other components within normal limits  CBC -  Abnormal; Notable for the following:    RBC 3.55 (*)    Hemoglobin 8.3 (*)    HCT 27.2 (*)    MCV 76.6 (*)    MCH 23.4 (*)    RDW 16.7 (*)    All other components within normal limits  HEPARIN LEVEL (UNFRACTIONATED) - Abnormal; Notable for the following:    Heparin Unfractionated 0.21 (*)    All other components within normal limits  CBC - Abnormal; Notable for the following:    RBC 3.42 (*)    Hemoglobin 8.0 (*)    HCT 26.2 (*)    MCV 76.6 (*)    MCH 23.4 (*)    RDW 16.5 (*)    All other components within normal limits  MRSA PCR SCREENING  APTT  PREALBUMIN  APTT  HEPARIN LEVEL (UNFRACTIONATED)  HEPARIN LEVEL (UNFRACTIONATED)  CBC  BASIC METABOLIC PANEL  MAGNESIUM  TYPE AND SCREEN  PREPARE RBC (CROSSMATCH)  SURGICAL PATHOLOGY    Imaging Review No results found. I have personally reviewed and evaluated these images and lab results as part of my medical decision-making.   EKG Interpretation None     on previous visit.  She was seen by the general surgeon who was unable to completely reduce her prolapse.  She states as soon as she arrived him again prolapsed out.  She does not feel she can wait to see Dr.  Elinor Parkinson in the morning  MDM   Final diagnoses:  Colostomy prolapse         Junius Creamer, NP 03/06/15 1946  Everlene Balls, MD 03/11/15 1755

## 2015-03-03 NOTE — Progress Notes (Signed)
ANTICOAGULATION CONSULT NOTE - Initial Consult  Pharmacy Consult for heparin  Indication:hx pulmonary embolus  Allergies  Allergen Reactions  . Oxycontin [Oxycodone Hcl] Anaphylaxis, Hives and Itching  . Codeine Itching and Nausea And Vomiting    Patient Measurements: Height: 5\' 7"  (170.2 cm) Weight: 147 lb 4.3 oz (66.8 kg) IBW/kg (Calculated) : 61.6 Heparin Dosing Weight: 67 kg  Vital Signs: Temp: 97.5 F (36.4 C) (08/29 0713) Temp Source: Oral (08/29 0713) BP: 109/50 mmHg (08/29 0713) Pulse Rate: 79 (08/29 0713)  Labs:  Recent Labs  03/02/15 1230  HGB 9.6*  HCT 31.5*  PLT 373  CREATININE 0.55    Estimated Creatinine Clearance: 70.9 mL/min (by C-G formula based on Cr of 0.55).   Medical History: Past Medical History  Diagnosis Date  . Colon cancer     dx'd 2014  . Heart murmur     ? as a child   . Pulmonary embolism 11/2014   . Anemia     hx of     Medications:  See med rec  Assessment: Patient is a 64 y.o F with hx colon cancer-- s/[name] transverse loop colostomy on 10/28/14 for obstructing colon and revision of colostomy on 01/03/15.  She also as a hx of PE (11/13/14) on xarelto PTA. She presented to [facility] ED on 8/28 with prolapsed loop colostomy.  To bridge with heparin while xarelto is on hold.  - xarelto 20mg  daily PTA (last dose taken on 8/28 at 1830)   Goal of Therapy:  Heparin level 0.3-0.7 units/ml Monitor platelets by anticoagulation protocol: Yes   Plan:  - start heparin drip at 1200 units/hr (no bolus) at 1830 today - baseline aPTT and heparin level at 5PM today - aPTT and heparin level 6hrs after starting heparin drip - xarelto will affect heparin level.  Will monitor with both aPTT and heparin level--> will switch to heparin level monitoring only once heparin level is at or below therapeutic range and correlate with aPTT - monitor for s/s bleeding   [name] [name] 03/03/2015,8:28 AM

## 2015-03-03 NOTE — Progress Notes (Addendum)
Patient ID: [name] [name], female   DOB: 06/02/1951, 64 y.o.   MRN: [id number]     [name] [place], [place] [address]    Phone: [phone] FAX: [phone]     Subjective: No pain.  Ostomy functioning.    Objective:  Vital signs:  Filed Vitals:   03/02/15 2330 03/03/15 0155 03/03/15 0156 03/03/15 0713  BP: 100/50  120/52 109/50  Pulse: 106  89 79  Temp: 98.1 F (36.7 C)  97.9 F (36.6 C) 97.5 F (36.4 C)  TempSrc: Oral  Oral Oral  Resp: _0 Height:  _1  (1.702 m)    Weight:  66.8 kg (147 lb 4.3 oz)    SpO2: 100%  100% 100%    Last BM Date: 03/02/15  Intake/Output   Yesterday:    This shift:   Physical Exam: General: Pt awake/alert/oriented x4 in no acute distress Abdomen: Soft.  Nondistended.  RUQ ostomy is soft, prolapsed, pink and viable, and almost completely reducible.   No evidence of peritonitis.  No incarcerated hernias.    Problem List:   Principal Problem:   Colostomy prolapse Active Problems:   Colorectal cancer, stage IV, obstructing s/p colonic diversion   Nausea with vomiting    Results:   Labs: Results for orders placed or performed during the hospital encounter of 03/02/15 (from the past 48 hour(s))  Basic metabolic panel     Status: Abnormal   Collection Time: 03/02/15 12:30 PM  Result Value Ref Range   Sodium 139 135 - 145 mmol/L   Potassium 4.2 3.5 - 5.1 mmol/L   Chloride 113 (H) 101 - 111 mmol/L   CO2 20 (L) 22 - 32 mmol/L   Glucose, Bld 89 65 - 99 mg/dL   BUN 15 6 - 20 mg/dL   Creatinine, Ser 0.55 0.44 - 1.00 mg/dL   Calcium 10.8 (H) 8.9 - 10.3 mg/dL   GFR calc non Af Amer >60 >60 mL/min   GFR calc Af Amer >60 >60 mL/min    Comment: (NOTE) The eGFR has been calculated using the CKD EPI equation. This calculation has not been validated in all clinical situations. eGFR's persistently <60 mL/min signify possible Chronic Kidney Disease.    Anion gap 6 5 - 15  CBC     Status: Abnormal   Collection Time: 03/02/15 12:30 PM  Result Value Ref Range   WBC 7.0 4.0 - 10.5 K/uL   RBC 4.13 3.87 - 5.11 MIL/uL   Hemoglobin 9.6 (L) 12.0 - 15.0 g/dL   HCT 31.5 (L) 36.0 - 46.0 %   MCV 76.3 (L) 78.0 - 100.0 fL   MCH 23.2 (L) 26.0 - 34.0 pg   MCHC 30.5 30.0 - 36.0 g/dL   RDW 16.5 (H) 11.5 - 15.5 %   Platelets 373 150 - 400 K/uL    Imaging / Studies: No results found.  Medications / Allergies:  Scheduled Meds: . antiseptic oral rinse  7 mL Mouth Rinse BID  . dextrose 5 % and 0.45% NaCl   Intravenous STAT   Continuous Infusions:  PRN Meds:.morphine injection  Antibiotics: Anti-infectives    None        Assessment/Plan S/p loop transverse colostomy for obstructing colon cancer 4/16, revised 7/16  Prolapsed loop colostomy  -ostomy is functioning and abdominal exam is benign. Revision in July was palliative.  Will ask Dr. Johney Maine to give his  opinion and ask Dr. Burr Medico to weigh in. Start muscle relaxant. Pulmonary embolism-Dx 5/16.  Last dose of Xarelto 8/28 PM.  Consult to pharmacy for heparin Anemia-stable  FEN-IVF, plan to resume diet Dispo-TBD  Erby Pian, [facility] Pager [phone])   03/03/2015 8:33 AM

## 2015-03-03 NOTE — H&P (Signed)
Re:   [name] [name] DOB:   January 28, 1951 MRN:   [id number]   [facility] Admission  ASSESSMENT AND PLAN: 1. Prolapsed loop colostomy [photo under me PE or "media" in Epic] Last revised 01/03/2015. She wanted to go home and talk to Dr. Zella Richer this week, but she got home, the colostomy prolapsed again and she was back to the [facility] around midnight last night.  She is admitted to manage the prolapse.  Will discuss with Dr. Johney Maine, the surgeon of the week.  As of this note, it is again reduced to the point where it is functioning and she is not in pain.  2. 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 3. Lung nodule 4. History of RLL PE in 11/2014 On Xarelto 5. Anemia  Hgb - 9.6 - 03/02/2015  Chief Complaint  Patient presents with  . Abdominal Pain   REFERRING PHYSICIAN: [facility]  HISTORY OF PRESENT ILLNESS: [name] [name] is a 64 y.o. (DOB: April 20, 1951)  AA  female whose primary care physician is [facility]. She is admitted for a prolapsing RUQ loop colostomy.  She had a transverse loop colostomy for obstructing stage IV colon cancer by Dr. Zella Richer 10/28/2014. He did a revision of the loop colostomy with partial colectomy on 01/03/2015.  He saw in the [facility] on 02/22/2015 for another prolapse of her loop colostomy.  Her last abdominal CT scan was 11/13/2014 (prior to colostomy revision) showing a lot of prolapsing colon.  Dr. Burr Medico is her treating oncologist. She has received FOLFOX and FOLFIRI. She has also had capacitain.  Her CEA on 12/13/2014 was 10.9. It was as high as 286 on 07/23/2013. It was last in the normal range - 4.1 on 09/13/2014. At this time, she is not on chemotx.     Past Medical History  Diagnosis Date  . Colon cancer     dx'd 2014  . Heart murmur     ? as a child   . Pulmonary embolism 11/2014   . Anemia     hx of       Past  Surgical History  Procedure Laterality Date  . Abdominal hysterectomy  2005  . Cesarean section  1976  . Flexible sigmoidoscopy N/A 05/25/2013    Procedure: FLEXIBLE SIGMOIDOSCOPY;  Surgeon: [name], MD;  [facility];  Service: Endoscopy;  Laterality: N/A;  needs floro  . Colonic stent placement N/A 05/25/2013    Procedure: COLONIC STENT PLACEMENT;  Surgeon: [name], MD;  [facility];  Service: Endoscopy;  Laterality: N/A;  . Portacath placement Right 06/25/2013    Procedure: ULTRA SOUND GUIDED INSERTION PORT-A-CATH;  Surgeon: [name], MD;  Location: [place];  Service: General;  Laterality: Right;  . Flexible sigmoidoscopy N/A 08/19/2014    Procedure: FLEXIBLE SIGMOIDOSCOPY;  Surgeon: Gatha Mayer, MD;  Location: [facility];  Service: Endoscopy;  Laterality: N/A;  . Colonic stent placement N/A 08/19/2014    Procedure: COLONIC STENT PLACEMENT;  Surgeon: Gatha Mayer, MD;  [facility];  Service: Endoscopy;  Laterality: N/A;  . Flexible sigmoidoscopy N/A 08/19/2014    Procedure: FLEXIBLE SIGMOIDOSCOPY;  Surgeon: Gatha Mayer, MD;  [facility];  Service: Endoscopy;  Laterality: N/A;  . Colonic stent placement N/A 08/19/2014    Procedure: COLONIC STENT PLACEMENT;  Surgeon: Gatha Mayer, MD;  [facility];  Service: Endoscopy;  Laterality: N/A;  .  Colon resection N/A 10/28/2014    Procedure: Transverse loop colostomy;  Surgeon: [name], MD;  [facility];  Service: General;  Laterality: N/A;  . Colostomy revision N/A 01/03/2015    Procedure: REVISION OF TRANSVERSE LOOP COLOSTOMY WITH PARTIAL  COLECTOMY;  Surgeon: [name], MD;  [facility];  Service: General;  Laterality: N/A;      Current Facility-Administered Medications  Medication Dose Route Frequency Provider Last Rate Last Dose  . antiseptic oral rinse (CPC / CETYLPYRIDINIUM CHLORIDE 0.05%) solution 7 mL  7 mL Mouth  Rinse BID Md Ccs, MD      . dextrose 5 %-0.45 % sodium chloride infusion   Intravenous STAT Junius Creamer, NP 20 mL/hr at 03/03/15 0207    . morphine 4 MG/ML injection 4 mg  4 mg Intravenous Q2H PRN Junius Creamer, NP   4 mg at 03/03/15 0136      Allergies  Allergen Reactions  . Oxycontin [Oxycodone Hcl] Anaphylaxis, Hives and Itching  . Codeine Itching and Nausea And Vomiting    REVIEW OF SYSTEMS: Skin: No history of rash. No history of abnormal moles. Infection: No history of hepatitis or HIV. No history of MRSA. Neurologic: No history of stroke. No history of seizure. No history of headaches. Cardiac: No history of hypertension. No history of heart disease. No history of prior cardiac catheterization. No history of seeing a cardiologist. Pulmonary: History of PE in 11/2014  Endocrine: No diabetes. No thyroid disease. Gastrointestinal: See HPI. Her weight has been stable the last year. Urologic: No history of kidney stones. No history of bladder infections. Musculoskeletal: No history of joint or back disease. Hematologic: Anemic. On Xarelto. Psycho-social: The patient is oriented. The patient has no obvious psychologic or social impairment to understanding our conversation and plan.  SOCIAL and FAMILY HISTORY: Married. Husband, [name]. She has two daughters and one son. Selinda Eon, her daughter, was with her last PM.  PHYSICAL EXAM: BP 120/52 mmHg  Pulse 89  Temp(Src) 97.9 F (36.6 C) (Oral)  Resp 18  Ht 5\' 7"  (1.702 m)  Wt 66.8 kg (147 lb 4.3 oz)  BMI 23.06 kg/m2  SpO2 100%  General: AA who is alert and generally healthy appearing.  HEENT: Normal. Pupils equal. Neck: Supple. No mass.  No thyroid mass. Lymph Nodes:  No supraclavicular or cervical nodes. Lungs: Clear to auscultation and symmetric breath sounds. Heart:  RRR. No murmur or rub.  Abdomen: Soft.  Prolapsed RUQ ostomy (about 13 cm). It is soft. No tenderness. Stool is coming out of  ostomy.  Extremities:  Good strength and ROM  in upper and lower extremities. Neurologic:  Grossly intact to motor and sensory function. Psychiatric: Has normal mood and affect. Behavior is normal.      DATA REVIEWED: Epic notes reviewed.  Alphonsa Overall, MD,  [facility], [address]  [place], [place]    [place] Phone:  [phone] FAX:  [phone]

## 2015-03-03 NOTE — Progress Notes (Addendum)
Chart reviewed.  Patient examined.  Discussed with Dr. Zella Richer.  Discussed with medical oncology.  Dr. Burr Medico.   I believe she has failed colostomy diversion despite revision.  At the very least her colostomy needs to be relocated.  If she has no evidence of significant progression of disease by CT scan, reasonable to try and proceed with palliative resection of primary by MIS lap (prob hand-assisted through colostomy hernia) low anterior rectosigmoid resection.  Could try and do anastomosis if there is no evidence of carcinomatosis or significant concerns for further obstruction or at least loop ileostomy diversion with possible takedown later.  Otherwise would switch to a descending colostomy.  Leave the left colic artery and IMV to help lower the chance of prolapse.  The anatomy & physiology of the digestive tract was discussed.  The pathophysiology of the colon was discussed.  Natural history risks without surgery was discussed.   I feel the risks of no intervention will lead to serious problems that outweigh the operative risks; therefore, I recommended a partial colectomy to remove the pathology.  Minimally invasive (Robotic/Laparoscopic) & open techniques were discussed.   Risks such as bleeding, infection, abscess, leak, reoperation, possible ostomy, hernia, heart attack, stroke, death, and other risks were discussed.  I noted a good likelihood this will help address the problem.   Goals of post-operative recovery were discussed as well.   Need for adequate nutrition, daily bowel regimen and healthy physical activity, to optimize recovery was noted as well. We will work to minimize complications.  Educational materials were available as well.  Questions were answered.  The patient expresses understanding & wishes to proceed with surgery.  Discussed with patient and her sister and mother.  Patient is wishing to be aggressive.  She is discussed with her family including her daughters at length.   She is transitioning from avoiding any care to wishing to be aggressive.  She is much more interested in trying to resect the primary cancer after thinking about things for quite some time and struggling with her colostomy.  She is hopeful to avoid any more ostomy but understands that there is a significant likelihood she will have a different one.  Posted for Thursday 9/1 to allow Xerelto to wear off 72 hours.  WOULD LIKE OFF HEPARIN X 6 HOURS PREOP.  Adin Hector, M.D., F.A.C.S. Gastrointestinal and Minimally Invasive Surgery [facility] [address] [place], [place] [address] [phone] Main / Paging

## 2015-03-03 NOTE — Progress Notes (Signed)
[name] [name]   DOB:[birth year]   FH#:[id number]   LHT#:[id number]  Subjective: [name] [name] is well known to me, I have been seeing her for her metastatic colon cancer. She was admitted this morning for recurrent prolapsed colostomy loop.   Objective:  Filed Vitals:   03/03/15 1423  BP: 110/55  Pulse: 80  Temp: 98.3 F (36.8 C)  Resp: 16    Body mass index is 23.06 kg/(m^2). No intake or output data in the 24 hours ending 03/03/15 1830   Sclerae unicteric  Oropharynx clear  No peripheral adenopathy  Lungs clear -- no rales or rhonchi  Heart regular rate and rhythm  Abdomen nontender, a large prolapsed colostomy loop  MSK no focal spinal tenderness, no peripheral edema  Neuro nonfocal   CBG (last 3)  No results for input(s): GLUCAP in the last 72 hours.   Labs:  Lab Results  Component Value Date   WBC 7.0 03/02/2015   HGB 9.6* 03/02/2015   HCT 31.5* 03/02/2015   MCV 76.3* 03/02/2015   PLT 373 03/02/2015   NEUTROABS 5.8 01/01/2015    @LASTCHEMISTRY @  Urine Studies No results for input(s): UHGB, CRYS in the last 72 hours.  Invalid input(s): UACOL, UAPR, USPG, UPH, UTP, UGL, UKET, UBIL, UNIT, UROB, ULEU, UEPI, UWBC, URBC, UBAC, CAST, UCOM, BILUA  Basic Metabolic Panel:  Recent Labs Lab 03/02/15 1230  NA 139  K 4.2  CL 113*  CO2 20*  GLUCOSE 89  BUN 15  CREATININE 0.55  CALCIUM 10.8*   GFR Estimated Creatinine Clearance: 70.9 mL/min (by C-G formula based on Cr of 0.55). Liver Function Tests: No results for input(s): AST, ALT, ALKPHOS, BILITOT, PROT, ALBUMIN in the last 168 hours. No results for input(s): LIPASE, AMYLASE in the last 168 hours. No results for input(s): AMMONIA in the last 168 hours. Coagulation profile No results for input(s): INR, PROTIME in the last 168 hours.  CBC:  Recent Labs Lab 03/02/15 1230  WBC 7.0  HGB 9.6*  HCT 31.5*  MCV 76.3*  PLT 373   Cardiac Enzymes: No results for input(s): CKTOTAL, CKMB, CKMBINDEX, TROPONINI in  the last 168 hours. BNP: Invalid input(s): POCBNP CBG: No results for input(s): GLUCAP in the last 168 hours. D-Dimer No results for input(s): DDIMER in the last 72 hours. Hgb A1c No results for input(s): HGBA1C in the last 72 hours. Lipid Profile No results for input(s): CHOL, HDL, LDLCALC, TRIG, CHOLHDL, LDLDIRECT in the last 72 hours. Thyroid function studies No results for input(s): TSH, T4TOTAL, T3FREE, THYROIDAB in the last 72 hours.  Invalid input(s): FREET3 Anemia work up No results for input(s): VITAMINB12, FOLATE, FERRITIN, TIBC, IRON, RETICCTPCT in the last 72 hours. Microbiology No results found for this or any previous visit (from the past 240 hour(s)).    Studies:  Ct Chest, abdomen and pelvis W Contrast 03/03/2015    FINDINGS: CT CHEST FINDINGS  Mediastinum/Lymph Nodes: Heart size is normal. There is no significant pericardial fluid, thickening or pericardial calcification. No pathologically enlarged mediastinal or hilar lymph nodes. Esophagus is unremarkable in appearance. No axillary lymphadenopathy. Right internal jugular single-lumen porta cath with tip terminating in the distal superior vena cava.  Lungs/Pleura: 5 mm pulmonary nodule in the left lower lobe (image 36 of series 4), unchanged compared to prior study CT the abdomen and pelvis 05/22/2013, considered benign. No other larger more suspicious appearing pulmonary nodules or masses are otherwise noted. No acute consolidative airspace disease. No pleural effusions.  Musculoskeletal/Soft Tissues: There are  no aggressive appearing lytic or blastic lesions noted in the visualized portions of the skeleton.  CT ABDOMEN AND PELVIS FINDINGS  Hepatobiliary: Again noted is a large liver lesion centered in the right lobe of the liver measuring approximately 7.6 x 4.7 cm, which is similar in appearance to prior studies demonstrating early peripheral nodular enhancement with progressive centripetal filling, compatible with a  cavernous hemangioma. However, today's study demonstrates a new 2.3 x 2.2 cm hypovascular lesion posterior to the cavernous hemangioma in segments 6/7 (image 57 of series 2), concerning for a new metastatic lesion. There is also a new hypovascular lesion in segment 2 (image 51 of series 2) measuring 2.5 x 2.3 cm. Several other smaller low-attenuation lesions are noted, but are too small to definitively characterize, but similar to prior examinations (potentially small cysts). No intra or extrahepatic biliary ductal dilatation. Gallbladder is normal in appearance. Partially calcified lesion in segment 2 of the liver is unchanged, and may either represent a treated metastatic lesion or a calcified granuloma. Other smaller cavernous hemangioma measuring 2.1 cm in segment 8 (image 48 of series 2) is similar to prior examinations.  Pancreas: No pancreatic mass. No pancreatic ductal dilatation. No pancreatic or peripancreatic fluid or inflammatory changes.  Spleen: Continued interval enlargement of what is now a 5.9 x 5.1 cm lesion in the anterior aspect of the spleen, which remains suspicious for potential metastatic lesion.  Adrenals/Urinary Tract: Nonobstructive calculi in the left renal collecting system measuring up to 8 mm in the lower pole. No hydroureteronephrosis. Urinary bladder is normal in appearance. Bilateral adrenal glands are normal in appearance.  Stomach/Bowel: The appearance of the stomach is unremarkable. No pathologic dilatation of small bowel or colon. Transverse colostomy with large peristomal hernia containing multiple loops of small bowel. Stent in the distal sigmoid colon extending into the proximal rectum, with a large amount of masslike soft tissue encircling the stent, encroaching upon the proximal aspect of the stent and invading into the adjacent mesocolon/mesorectum. This is particularly evident medially where there is a focal mass-like area measuring approximately 3.5 x 2.8 cm (image 104  of series 2).  Vascular/Lymphatic: Atherosclerosis throughout the abdominal and pelvic vasculature, without evidence of aneurysm or dissection. Multiple prominent perirectal lymph nodes noted, measuring up to 7 mm in the mesorectum. 8 mm short axis lymph node adjacent to the inferior mesenteric artery (image 83 of series 2) is conspicuous but nonspecific.  Reproductive: Status post hysterectomy. Ovaries are not confidently identified may be surgically absent or atrophic.  Other: Trace volume of ascites.  No pneumoperitoneum.  Musculoskeletal: There are no aggressive appearing lytic or blastic lesions noted in the visualized portions of the skeleton.  IMPRESSION: 1. Large stent in place extending from the distal sigmoid colon into the proximal rectum traversing the known colorectal neoplasm which has significantly increased in size compared to the prior study, with greater extension into the surrounding mesorectal/mesocolic fat, with enlarging (but not technically enlarged) mesorectal lymph nodes and new liver lesions, as detailed above, compatible with new hepatic metastases. 2. Previously noted enhancing splenic lesion continues to enlarge and remains concerning for possible metastasis. 3. No definite signs of metastatic disease to the thorax at this time. 4. Nonobstructive calculi in the left renal collecting system measuring up to 8 mm in the lower pole. 5. Additional incidental findings, similar to prior studies, as above.   Electronically Signed   By: Vinnie Langton M.D.   On: 03/03/2015 15:46   Assessment: 64 y.o. with metastatic  colon cancer, currently off chemotherapy treatment.   1. 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 PE diagnosed in May 2016 3. 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 Recurrent prolapsed colostomy loop  Plan:  -I have spoken with colorectal surgeon Dr. Johney Maine today, he will bring her back to OR, try  to resect the primary sigmoid colon mass if no significant carcinomatosis, and try anastomosis. If not feasible, then switch to a descending colostomy. I agree with the plan, and the patient is also very happy about the possibility of removing the colostomy bag. -She does have disease progression in the liver, no other obvious new metastasis. I'll see her after she recovers from this surgery, to see if she would've her consider chemotherapy.  -Her Xarelto has been switched to IV heparin due to the pending colon surgery.  I will follow up.   Truitt Merle, MD 03/03/2015  6:30 PM

## 2015-03-03 NOTE — Progress Notes (Signed)
ANTICOAGULATION CONSULT NOTE - F/u Consult  Pharmacy Consult for heparin  Indication:hx pulmonary embolus  Allergies  Allergen Reactions  . Oxycontin [Oxycodone Hcl] Anaphylaxis, Hives and Itching  . Codeine Itching and Nausea And Vomiting    Patient Measurements: Height: 5\' 7"  (170.2 cm) Weight: 147 lb 4.3 oz (66.8 kg) IBW/kg (Calculated) : 61.6 Heparin Dosing Weight: 67 kg  Vital Signs: Temp: 98.4 F (36.9 C) (08/29 2105) Temp Source: Oral (08/29 2105) BP: 103/48 mmHg (08/29 2105) Pulse Rate: 85 (08/29 2105)  Labs:  Recent Labs  03/02/15 1230 03/03/15 1635 03/03/15 2240  HGB 9.6*  --   --   HCT 31.5*  --   --   PLT 373  --   --   APTT  --  32 36  HEPARINUNFRC  --  0.26* 0.21*  CREATININE 0.55  --   --     Estimated Creatinine Clearance: 70.9 mL/min (by C-G formula based on Cr of 0.55).   Medical History: Past Medical History  Diagnosis Date  . Colon cancer     dx'd 2014  . Heart murmur     ? as a child   . Pulmonary embolism 11/2014   . Anemia     hx of     Medications:  See med rec  Assessment: Patient is a 64 y.o F with hx colon cancer-- s/p transverse loop colostomy on 10/28/14 for obstructing colon and revision of colostomy on 01/03/15.  She also as a hx of PE (11/13/14) on xarelto PTA. She [facility] ED [facility]28 with prolapsed loop colostomy.  To bridge with heparin while xarelto is on hold.  - xarelto 20mg  daily PTA (last dose taken on 8/28 at 1830) Today, 03/03/15  2240 HL=0.21 and aPtt=36 , both low after heparin drip @ 1200 units/hr x 6 hrs, no problems  Will go ahead and stop aPtt levels since HL don't seem to be elevated d/t Xarelto.     Goal of Therapy:  Heparin level 0.3-0.7 units/ml Monitor platelets by anticoagulation protocol: Yes   Plan:   Increase heparin drip to 1400 units/hr  Heparin level 6hrs after increasing heparin drip  monitor for s/s bleeding   Dorrene German 03/03/2015,11:53 PM

## 2015-03-03 NOTE — Consult Note (Signed)
WOC ostomy consult note Patient seen per Dr. Johney Maine' request for assessment and preoperative stoma site selection prior to OR on Thursday, 03/06/15. She is well known to me from previous admissions for her large prolapsing stoma.  I am not able to mark a right sided site as her existing stoma is currently prolapsed and encompasses the optimal sites on the right. The left side of the abdomen is assessed and palpated and two sites are selected.  Her large family is present and the patient requests that they stay for the marking. The LUQ site is located 5.5cm to the left of the umbilicus and 8.7GO above. The LLQ site is located 4cm to the left of the umbilicus and 5cm below. The surgical sites are marked with a surgical site marking pen and covered with thin film transparent dressings. As she is well-versed in ostomy care, I do not leave teaching materials in the room for her perusal. Patient is aware that while it is her hope that the intestine could be re-anastomosed, the end result of the upcoming surgery may be an ostomy.  She is at peace with her choice of surgeon and the plan for surgery and will face whatever the outcome will be.  She is hopeful that I will remain her ostomy nurse and is reassured that I would be. [place] nursing team will not follow, but will remain available to this patient, the nursing and medical team.  Please re-consult if needed. Thanks, Maudie Flakes, MSN, RN, [place], [place], [place] [phone])

## 2015-03-04 LAB — COMPREHENSIVE METABOLIC PANEL
ALBUMIN: 3 g/dL — AB (ref 3.5–5.0)
ALT: 9 U/L — ABNORMAL LOW (ref 14–54)
AST: 16 U/L (ref 15–41)
Alkaline Phosphatase: 53 U/L (ref 38–126)
Anion gap: 6 (ref 5–15)
BILIRUBIN TOTAL: 0.2 mg/dL — AB (ref 0.3–1.2)
BUN: 12 mg/dL (ref 6–20)
CHLORIDE: 109 mmol/L (ref 101–111)
CO2: 23 mmol/L (ref 22–32)
Calcium: 10.5 mg/dL — ABNORMAL HIGH (ref 8.9–10.3)
Creatinine, Ser: 0.59 mg/dL (ref 0.44–1.00)
GFR calc Af Amer: >60 mL/min (ref >60)
GFR calc non Af Amer: >60 mL/min (ref >60)
GLUCOSE: 100 mg/dL — AB (ref 65–99)
POTASSIUM: 4.3 mmol/L (ref 3.5–5.1)
Sodium: 138 mmol/L (ref 135–145)
TOTAL PROTEIN: 7.1 g/dL (ref 6.5–8.1)

## 2015-03-04 LAB — CBC
HEMATOCRIT: 27.2 % — AB (ref 36.0–46.0)
Hemoglobin: 8.3 g/dL — ABNORMAL LOW (ref 12.0–15.0)
MCH: 23.4 pg — AB (ref 26.0–34.0)
MCHC: 30.5 g/dL (ref 30.0–36.0)
MCV: 76.6 fL — AB (ref 78.0–100.0)
Platelets: 286 10*3/uL (ref 150–400)
RBC: 3.55 MIL/uL — ABNORMAL LOW (ref 3.87–5.11)
RDW: 16.7 % — AB (ref 11.5–15.5)
WBC: 5.2 10*3/uL (ref 4.0–10.5)

## 2015-03-04 LAB — HEPARIN LEVEL (UNFRACTIONATED): Heparin Unfractionated: 0.43 IU/mL (ref 0.30–0.70)

## 2015-03-04 MED ORDER — BOOST / RESOURCE BREEZE PO LIQD
1.0000 | Freq: Three times a day (TID) | ORAL | Status: DC
  Administered 2015-03-04 – 2015-03-07 (×7): 1 via ORAL
  Administered 2015-03-07: 0.9875 via ORAL
  Administered 2015-03-09 – 2015-03-10 (×4): 1 via ORAL

## 2015-03-04 NOTE — Care Management Note (Signed)
Case Management Note  Patient Details  Name: NEKEYA BRISKI MRN: [id number] Date of Birth: Nov 08, 1950  Subjective/Objective:                  Prolapsed   Action/Plan:   Expected Discharge Date:                  Expected Discharge Plan:  [place]  In-House Referral:     Discharge planning Services  CM Consult  Post Acute Care Choice:    Choice offered to:     DME Arranged:    DME Agency:     HH Arranged:    [place] Agency:  [place]  Status of Service:  In process, will continue to follow  Medicare Important Message Given:    Date Medicare IM Given:    Medicare IM give by:    Date Additional Medicare IM Given:    Additional Medicare Important Message give by:     If discussed at [place] of Stay Meetings, dates discussed:    Additional Comments: CM notified [facility] rep, Cyril Mourning, pt is in hospital.  Both [facility] and CM will follow for [facility] needs.   Dellie Catholic, RN 03/04/2015, 3:36 PM

## 2015-03-04 NOTE — Care Management Note (Signed)
Case Management Note  Patient Details  Name: [name] [name] MRN: [id number] Date of Birth: Dec 02, 1950  Subjective/Objective:                   Prolapsed stoma Action/Plan: Discharge planning  Expected Discharge Date:                  Expected Discharge Plan:  [place]  In-House Referral:     Discharge planning Services  CM Consult  Post Acute Care Choice:    Choice offered to:     DME Arranged:    DME Agency:     HH Arranged:    [place] Agency:  [place]  Status of Service:  In process, will continue to follow  Medicare Important Message Given:    Date Medicare IM Given:    Medicare IM give by:    Date Additional Medicare IM Given:    Additional Medicare Important Message give by:     If discussed at [place] of Stay Meetings, dates discussed:    Additional Comments: Utilization Review complete.  CM notes pt has had [facility] in the past for ostomy support.  CM will follow for disposition. Dellie Catholic, RN 03/04/2015, 3:37 PM

## 2015-03-04 NOTE — Progress Notes (Signed)
ANTICOAGULATION CONSULT NOTE - Initial Consult  Pharmacy Consult for heparin  Indication:hx pulmonary embolus  Allergies  Allergen Reactions  . Oxycontin [Oxycodone Hcl] Anaphylaxis, Hives and Itching  . Codeine Itching and Nausea And Vomiting    Patient Measurements: Height: 5\' 7"  (170.2 cm) Weight: 147 lb 4.3 oz (66.8 kg) IBW/kg (Calculated) : 61.6 Heparin Dosing Weight: 67 kg  Vital Signs: Temp: 98.5 F (36.9 C) (08/30 0542) Temp Source: Oral (08/30 0542) BP: 97/49 mmHg (08/30 0542) Pulse Rate: 81 (08/30 0542)  Labs:  Recent Labs  03/02/15 1230 03/03/15 1635 03/03/15 2240 03/04/15 0654  HGB 9.6*  --   --  8.3*  HCT 31.5*  --   --  27.2*  PLT 373  --   --  286  APTT  --  32 36  --   HEPARINUNFRC  --  0.26* 0.21* 0.43  CREATININE 0.55  --   --  0.59    Estimated Creatinine Clearance: 70.9 mL/min (by C-G formula based on Cr of 0.59).   Medical History: Past Medical History  Diagnosis Date  . Colon cancer     dx'd 2014  . Heart murmur     ? as a child   . Pulmonary embolism 11/2014   . Anemia     hx of     Medications:  See med rec  Assessment: Patient is a 64 y.o F with hx colon cancer-- s/p transverse loop colostomy on 10/28/14 for obstructing colon and revision of colostomy on 01/03/15.  She also as a hx of PE (11/13/14) on xarelto PTA. She presented to [facility] ED on 8/28 with prolapsed loop colostomy.  To bridge with heparin while xarelto is on hold.  - xarelto 20mg  daily PTA (last dose taken on 8/28 at 1830)  Significant events:  8/30: plan for surgical intervention on 9/01.  Per [facility], d/c heparin drip 6 hours prior to surgery --> d/c drip at 0200 on 9/1.  Heparin SQ 5000 units x1 pre-op at 0600 on 9/1 (per [facility]).  Today, 03/04/2015:  Heparin level is therapeutic at 0.43 after rate increased to 1400 units/hr  hgb down slightly to 8.3, plt ok  No bleeding documented   Goal of Therapy:  Heparin level 0.3-0.7 units/ml Monitor platelets by  anticoagulation protocol: Yes   Plan:  - continue heparin drip at 1400 units/hr  - f/u with AM labs - monitor for s/s bleeding - d/c heparin drip at 0200 on 9/1  Oryon Gary P 03/04/2015,10:58 AM

## 2015-03-04 NOTE — Progress Notes (Signed)
Patient ID: [name] [name], female   DOB: 1950-10-06, 64 y.o.   MRN: [id number]     [address] [place], [place] [address]    Phone: [phone] FAX: [phone]     Subjective: Up walking.  No pain. VSS.  Afebrile.  Liquid stool output.    Objective:  Vital signs:  Filed Vitals:   03/03/15 1423 03/03/15 2105 03/04/15 0140 03/04/15 0542  BP: 110/55 103/48 102/52 97/49  Pulse: 80 85 75 81  Temp: 98.3 F (36.8 C) 98.4 F (36.9 C) 97.5 F (36.4 C) 98.5 F (36.9 C)  TempSrc: Oral Oral Oral Oral  Resp: '16 16 16 16  ' Height:      Weight:      SpO2: 100% 100% 100% 100%    Last BM Date: 03/03/15  Intake/Output   Yesterday:  08/29 0701 - 08/30 0700 In: 474.4 [I.V.:474.4] Out: 300 [Stool:300] This shift:    I/O last 3 completed shifts: In: 474.4 [I.V.:474.4] Out: 300 [Stool:300]     Physical Exam: General: Pt awake/alert/oriented x4 in no acute distress Lungs: cta. Cardio: s1s2 rrr. No murmurs, gallops or rubs.  Abdomen: Soft. Nondistended. RUQ ostomy is soft, prolapsed, pink and viable, and almost completely reducible. No evidence of peritonitis. No incarcerated hernias.   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 Active Problems:   Colorectal cancer, stage IV, obstructing s/p stenting & then colonic diversion   Nausea with vomiting   Anxiety   Upper abdominal pain   Long-term (current) use of anticoagulants   Parastomal hernia    Results:   Labs: Results for orders placed or performed during the hospital encounter of 03/02/15 (from the past 48 hour(s))  Heparin level (unfractionated)     Status: Abnormal   Collection Time: 03/03/15  4:35 PM  Result Value Ref Range   Heparin Unfractionated 0.26 (L) 0.30 - 0.70 IU/mL    Comment:        IF HEPARIN RESULTS ARE BELOW EXPECTED VALUES, AND PATIENT DOSAGE HAS BEEN CONFIRMED, SUGGEST FOLLOW UP  TESTING OF ANTITHROMBIN III LEVELS.   APTT     Status: None   Collection Time: 03/03/15  4:35 PM  Result Value Ref Range   aPTT 32 24 - 37 seconds  Prealbumin     Status: None   Collection Time: 03/03/15  4:35 PM  Result Value Ref Range   Prealbumin 26.9 18 - 38 mg/dL    Comment: Performed at [facility]  Heparin level (unfractionated)     Status: Abnormal   Collection Time: 03/03/15 10:40 PM  Result Value Ref Range   Heparin Unfractionated 0.21 (L) 0.30 - 0.70 IU/mL    Comment:        IF HEPARIN RESULTS ARE BELOW EXPECTED VALUES, AND PATIENT DOSAGE HAS BEEN CONFIRMED, SUGGEST FOLLOW UP TESTING OF ANTITHROMBIN III LEVELS.   APTT     Status: None   Collection Time: 03/03/15 10:40 PM  Result Value Ref Range   aPTT 36 24 - 37 seconds  Comprehensive metabolic panel     Status: Abnormal   Collection Time: 03/04/15  6:54 AM  Result Value Ref Range   Sodium 138 135 - 145 mmol/L   Potassium 4.3 3.5 - 5.1 mmol/L   Chloride 109 101 - 111 mmol/L   CO2 23 22 - 32 mmol/L   Glucose, Bld 100 (H) 65 -  99 mg/dL   BUN 12 6 - 20 mg/dL   Creatinine, Ser 0.59 0.44 - 1.00 mg/dL   Calcium 10.5 (H) 8.9 - 10.3 mg/dL   Total Protein 7.1 6.5 - 8.1 g/dL   Albumin 3.0 (L) 3.5 - 5.0 g/dL   AST 16 15 - 41 U/L   ALT 9 (L) 14 - 54 U/L   Alkaline Phosphatase 53 38 - 126 U/L   Total Bilirubin 0.2 (L) 0.3 - 1.2 mg/dL   GFR calc non Af Amer >60 >60 mL/min   GFR calc Af Amer >60 >60 mL/min    Comment: (NOTE) The eGFR has been calculated using the CKD EPI equation. This calculation has not been validated in all clinical situations. eGFR's persistently <60 mL/min signify possible Chronic Kidney Disease.    Anion gap 6 5 - 15  CBC     Status: Abnormal   Collection Time: 03/04/15  6:54 AM  Result Value Ref Range   WBC 5.2 4.0 - 10.5 K/uL   RBC 3.55 (L) 3.87 - 5.11 MIL/uL   Hemoglobin 8.3 (L) 12.0 - 15.0 g/dL   HCT 27.2 (L) 36.0 - 46.0 %   MCV 76.6 (L) 78.0 - 100.0 fL   MCH 23.4 (L) 26.0 -  34.0 pg   MCHC 30.5 30.0 - 36.0 g/dL   RDW 16.7 (H) 11.5 - 15.5 %   Platelets 286 150 - 400 K/uL  Heparin level (unfractionated)     Status: None   Collection Time: 03/04/15  6:54 AM  Result Value Ref Range   Heparin Unfractionated 0.43 0.30 - 0.70 IU/mL    Comment:        IF HEPARIN RESULTS ARE BELOW EXPECTED VALUES, AND PATIENT DOSAGE HAS BEEN CONFIRMED, SUGGEST FOLLOW UP TESTING OF ANTITHROMBIN III LEVELS.     Imaging / Studies: Ct Chest W Contrast  03/03/2015   CLINICAL DATA:  64 year old female with history of colon cancer diagnosed in 2014 status post resection. Metastatic disease to the liver. Restaging examination.  EXAM: CT CHEST, ABDOMEN, AND PELVIS WITH CONTRAST  TECHNIQUE: Multidetector CT imaging of the chest, abdomen and pelvis was performed following the standard protocol during bolus administration of intravenous contrast.  CONTRAST:  1 OMNIPAQUE IOHEXOL 300 MG/ML SOLN, 149m OMNIPAQUE IOHEXOL 300 MG/ML SOLN  COMPARISON:  CT of the abdomen and pelvis 11/13/2014. CT of the chest 11/13/2014.  FINDINGS: CT CHEST FINDINGS  Mediastinum/Lymph Nodes: Heart size is normal. There is no significant pericardial fluid, thickening or pericardial calcification. No pathologically enlarged mediastinal or hilar lymph nodes. Esophagus is unremarkable in appearance. No axillary lymphadenopathy. Right internal jugular single-lumen porta cath with tip terminating in the distal superior vena cava.  Lungs/Pleura: 5 mm pulmonary nodule in the left lower lobe (image 36 of series 4), unchanged compared to prior study CT the abdomen and pelvis 05/22/2013, considered benign. No other larger more suspicious appearing pulmonary nodules or masses are otherwise noted. No acute consolidative airspace disease. No pleural effusions.  Musculoskeletal/Soft Tissues: There are no aggressive appearing lytic or blastic lesions noted in the visualized portions of the skeleton.  CT ABDOMEN AND PELVIS FINDINGS   Hepatobiliary: Again noted is a large liver lesion centered in the right lobe of the liver measuring approximately 7.6 x 4.7 cm, which is similar in appearance to prior studies demonstrating early peripheral nodular enhancement with progressive centripetal filling, compatible with a cavernous hemangioma. However, today's study demonstrates a new 2.3 x 2.2 cm hypovascular lesion posterior to the cavernous hemangioma  in segments 6/7 (image 57 of series 2), concerning for a new metastatic lesion. There is also a new hypovascular lesion in segment 2 (image 51 of series 2) measuring 2.5 x 2.3 cm. Several other smaller low-attenuation lesions are noted, but are too small to definitively characterize, but similar to prior examinations (potentially small cysts). No intra or extrahepatic biliary ductal dilatation. Gallbladder is normal in appearance. Partially calcified lesion in segment 2 of the liver is unchanged, and may either represent a treated metastatic lesion or a calcified granuloma. Other smaller cavernous hemangioma measuring 2.1 cm in segment 8 (image 48 of series 2) is similar to prior examinations.  Pancreas: No pancreatic mass. No pancreatic ductal dilatation. No pancreatic or peripancreatic fluid or inflammatory changes.  Spleen: Continued interval enlargement of what is now a 5.9 x 5.1 cm lesion in the anterior aspect of the spleen, which remains suspicious for potential metastatic lesion.  Adrenals/Urinary Tract: Nonobstructive calculi in the left renal collecting system measuring up to 8 mm in the lower pole. No hydroureteronephrosis. Urinary bladder is normal in appearance. Bilateral adrenal glands are normal in appearance.  Stomach/Bowel: The appearance of the stomach is unremarkable. No pathologic dilatation of small bowel or colon. Transverse colostomy with large peristomal hernia containing multiple loops of small bowel. Stent in the distal sigmoid colon extending into the proximal rectum, with a  large amount of masslike soft tissue encircling the stent, encroaching upon the proximal aspect of the stent and invading into the adjacent mesocolon/mesorectum. This is particularly evident medially where there is a focal mass-like area measuring approximately 3.5 x 2.8 cm (image 104 of series 2).  Vascular/Lymphatic: Atherosclerosis throughout the abdominal and pelvic vasculature, without evidence of aneurysm or dissection. Multiple prominent perirectal lymph nodes noted, measuring up to 7 mm in the mesorectum. 8 mm short axis lymph node adjacent to the inferior mesenteric artery (image 83 of series 2) is conspicuous but nonspecific.  Reproductive: Status post hysterectomy. Ovaries are not confidently identified may be surgically absent or atrophic.  Other: Trace volume of ascites.  No pneumoperitoneum.  Musculoskeletal: There are no aggressive appearing lytic or blastic lesions noted in the visualized portions of the skeleton.  IMPRESSION: 1. Large stent in place extending from the distal sigmoid colon into the proximal rectum traversing the known colorectal neoplasm which has significantly increased in size compared to the prior study, with greater extension into the surrounding mesorectal/mesocolic fat, with enlarging (but not technically enlarged) mesorectal lymph nodes and new liver lesions, as detailed above, compatible with new hepatic metastases. 2. Previously noted enhancing splenic lesion continues to enlarge and remains concerning for possible metastasis. 3. No definite signs of metastatic disease to the thorax at this time. 4. Nonobstructive calculi in the left renal collecting system measuring up to 8 mm in the lower pole. 5. Additional incidental findings, similar to prior studies, as above.   Electronically Signed   By: Vinnie Langton M.D.   On: 03/03/2015 15:46   Ct Abdomen Pelvis W Contrast  03/03/2015   CLINICAL DATA:  64 year old female with history of colon cancer diagnosed in 2014 status  post resection. Metastatic disease to the liver. Restaging examination.  EXAM: CT CHEST, ABDOMEN, AND PELVIS WITH CONTRAST  TECHNIQUE: Multidetector CT imaging of the chest, abdomen and pelvis was performed following the standard protocol during bolus administration of intravenous contrast.  CONTRAST:  1 OMNIPAQUE IOHEXOL 300 MG/ML SOLN, 183m OMNIPAQUE IOHEXOL 300 MG/ML SOLN  COMPARISON:  CT of the abdomen and pelvis  11/13/2014. CT of the chest 11/13/2014.  FINDINGS: CT CHEST FINDINGS  Mediastinum/Lymph Nodes: Heart size is normal. There is no significant pericardial fluid, thickening or pericardial calcification. No pathologically enlarged mediastinal or hilar lymph nodes. Esophagus is unremarkable in appearance. No axillary lymphadenopathy. Right internal jugular single-lumen porta cath with tip terminating in the distal superior vena cava.  Lungs/Pleura: 5 mm pulmonary nodule in the left lower lobe (image 36 of series 4), unchanged compared to prior study CT the abdomen and pelvis 05/22/2013, considered benign. No other larger more suspicious appearing pulmonary nodules or masses are otherwise noted. No acute consolidative airspace disease. No pleural effusions.  Musculoskeletal/Soft Tissues: There are no aggressive appearing lytic or blastic lesions noted in the visualized portions of the skeleton.  CT ABDOMEN AND PELVIS FINDINGS  Hepatobiliary: Again noted is a large liver lesion centered in the right lobe of the liver measuring approximately 7.6 x 4.7 cm, which is similar in appearance to prior studies demonstrating early peripheral nodular enhancement with progressive centripetal filling, compatible with a cavernous hemangioma. However, today's study demonstrates a new 2.3 x 2.2 cm hypovascular lesion posterior to the cavernous hemangioma in segments 6/7 (image 57 of series 2), concerning for a new metastatic lesion. There is also a new hypovascular lesion in segment 2 (image 51 of series 2) measuring 2.5 x  2.3 cm. Several other smaller low-attenuation lesions are noted, but are too small to definitively characterize, but similar to prior examinations (potentially small cysts). No intra or extrahepatic biliary ductal dilatation. Gallbladder is normal in appearance. Partially calcified lesion in segment 2 of the liver is unchanged, and may either represent a treated metastatic lesion or a calcified granuloma. Other smaller cavernous hemangioma measuring 2.1 cm in segment 8 (image 48 of series 2) is similar to prior examinations.  Pancreas: No pancreatic mass. No pancreatic ductal dilatation. No pancreatic or peripancreatic fluid or inflammatory changes.  Spleen: Continued interval enlargement of what is now a 5.9 x 5.1 cm lesion in the anterior aspect of the spleen, which remains suspicious for potential metastatic lesion.  Adrenals/Urinary Tract: Nonobstructive calculi in the left renal collecting system measuring up to 8 mm in the lower pole. No hydroureteronephrosis. Urinary bladder is normal in appearance. Bilateral adrenal glands are normal in appearance.  Stomach/Bowel: The appearance of the stomach is unremarkable. No pathologic dilatation of small bowel or colon. Transverse colostomy with large peristomal hernia containing multiple loops of small bowel. Stent in the distal sigmoid colon extending into the proximal rectum, with a large amount of masslike soft tissue encircling the stent, encroaching upon the proximal aspect of the stent and invading into the adjacent mesocolon/mesorectum. This is particularly evident medially where there is a focal mass-like area measuring approximately 3.5 x 2.8 cm (image 104 of series 2).  Vascular/Lymphatic: Atherosclerosis throughout the abdominal and pelvic vasculature, without evidence of aneurysm or dissection. Multiple prominent perirectal lymph nodes noted, measuring up to 7 mm in the mesorectum. 8 mm short axis lymph node adjacent to the inferior mesenteric artery  (image 83 of series 2) is conspicuous but nonspecific.  Reproductive: Status post hysterectomy. Ovaries are not confidently identified may be surgically absent or atrophic.  Other: Trace volume of ascites.  No pneumoperitoneum.  Musculoskeletal: There are no aggressive appearing lytic or blastic lesions noted in the visualized portions of the skeleton.  IMPRESSION: 1. Large stent in place extending from the distal sigmoid colon into the proximal rectum traversing the known colorectal neoplasm which has significantly increased in  size compared to the prior study, with greater extension into the surrounding mesorectal/mesocolic fat, with enlarging (but not technically enlarged) mesorectal lymph nodes and new liver lesions, as detailed above, compatible with new hepatic metastases. 2. Previously noted enhancing splenic lesion continues to enlarge and remains concerning for possible metastasis. 3. No definite signs of metastatic disease to the thorax at this time. 4. Nonobstructive calculi in the left renal collecting system measuring up to 8 mm in the lower pole. 5. Additional incidental findings, similar to prior studies, as above.   Electronically Signed   By: Vinnie Langton M.D.   On: 03/03/2015 15:46    Medications / Allergies:  Scheduled Meds: . [START ON 03/06/2015] alvimopan  12 mg Oral Once  . antiseptic oral rinse  7 mL Mouth Rinse BID  . [START ON 03/06/2015] bupivacaine liposome  20 mL Infiltration On Call to OR  . [START ON 03/06/2015] cefoTEtan (CEFOTAN) 2 GM IVPB  2 g Intravenous On Call to OR  . [START ON 03/05/2015] chlorhexidine  60 mL Topical Once   And  . chlorhexidine  60 mL Topical Once  . [START ON 03/06/2015] clindamycin / gentamicin INTRAPERITONEAL Lavage irrigation   Intraperitoneal To OR  . [START ON 03/06/2015] heparin  5,000 Units Subcutaneous Once  . lip balm  1 application Topical BID  . methocarbamol  500 mg Oral QID  . [START ON 03/05/2015] metroNIDAZOLE  1,000 mg Oral 3 times per  day on Wed  . [START ON 03/05/2015] neomycin  1,000 mg Oral 3 times per day on Wed  . polyethylene glycol  34 g Oral BID   Continuous Infusions: . heparin 1,400 Units/hr (03/04/15 0003)   PRN Meds:.alum & mag hydroxide-simeth, diphenhydrAMINE, lactated ringers, magic mouthwash, menthol-cetylpyridinium, metoprolol, morphine injection, ondansetron (ZOFRAN) IV **OR** ondansetron (ZOFRAN) IV, phenol, promethazine  Antibiotics: Anti-infectives    Start     Dose/Rate Route Frequency Ordered Stop   03/06/15 0600  cefoTEtan (CEFOTAN) 2 g in dextrose 5 % 50 mL IVPB     2 g 100 mL/hr over 30 Minutes Intravenous On call to O.R. 03/03/15 1205 03/07/15 0559   03/06/15 0600  clindamycin (CLEOCIN) 900 mg, gentamicin (GARAMYCIN) 240 mg in sodium chloride 0.9 % 1,000 mL for intraperitoneal lavage    Comments:  Pharmacy may adjust dosing strength, schedule, rate of infusion, etc as needed to optimize therapy    Intraperitoneal To Surgery 03/03/15 1205 03/07/15 0600   03/05/15 1300  neomycin (MYCIFRADIN) tablet 1,000 mg     1,000 mg Oral 3 times per day on Wed 03/03/15 1205 03/12/15 1259   03/05/15 1300  metroNIDAZOLE (FLAGYL) tablet 1,000 mg    Comments:  Take 2 pills (=1012m) by mouth at 1pm, 3pm, and 10pm the day before your colorectal operation   1,000 mg Oral 3 times per day on Wed 03/03/15 1205 03/12/15 1259       Assessment/Plan S/p loop transverse colostomy for obstructing colon cancer 4/16, revised 7/16  Prolapsed loop colostomy  -proceed with palliative resection of primary by MIS lap (prob hand-assisted through colostomy hernia) low anterior rectosigmoid resection. Could try and do anastomosis if there is no evidence of carcinomatosis or significant concerns for further obstruction or at least loop ileostomy diversion with possible takedown later. Otherwise would switch to a descending colostomy. Leave the left colic artery and IMV to help lower the chance of prolapse. Pulmonary  embolism-Dx 5/16. Last dose of Xarelto 8/28 PM. Heparin drip.  Plan to stop 9/1 0200.  Anemia-stable.  Type and cross in AM  FEN-fulls today and breakfast tomorrow.  Clears tomorrow lunch and then NPO after midnight 9/1.  Continue BID miralax to help clean out her colon. Antibiotics tomorrow. Nutrition-prealbumin 26 Dispo-OR on Thursday    Habeeb Puertas [place], [facility] Surgery Pager [phone])   03/04/2015 8:08 AM

## 2015-03-04 NOTE — Consult Note (Addendum)
WOC ostomy consult note Stoma type/location: RLQ transverse loop colostomy Patient contacted me today to request ostomy supplies.   The pouching system she applied on Sunday prior to coming to the hospital is leaking and she cannot reach her husband to obtain her supplies from home.  Two-piece 4-inch pouching supplies delivered to patient room and bedside RN supported as she assists patient in the changing of the pouching system.  Patient is clean, dry and pouching system intact following pouch change.  Still scheduled for OR on Thursday, 03/06/15. [place] nursing team will not follow routinely preop, but will standby and remain available to this patient, the nursing and medical and surgical teams if a new ostomy is created in the [place] on Thrusday.  Please re-consult if that is the case. Thanks, Maudie Flakes, MSN, RN, [place], [place], [place] [phone])

## 2015-03-05 LAB — HEMOGLOBIN A1C
Hgb A1c MFr Bld: 5.9 % — ABNORMAL HIGH (ref 4.8–5.6)
MEAN PLASMA GLUCOSE: 123 mg/dL

## 2015-03-05 LAB — HEPARIN LEVEL (UNFRACTIONATED): HEPARIN UNFRACTIONATED: 0.47 [IU]/mL (ref 0.30–0.70)

## 2015-03-05 MED ORDER — DEXTROSE IN LACTATED RINGERS 5 % IV SOLN
INTRAVENOUS | Status: DC
  Administered 2015-03-05: 100 mL/h via INTRAVENOUS
  Administered 2015-03-06: 04:00:00 via INTRAVENOUS
  Administered 2015-03-07: 125 mL/h via INTRAVENOUS
  Administered 2015-03-07 – 2015-03-10 (×3): via INTRAVENOUS

## 2015-03-05 MED ORDER — HEPARIN SODIUM (PORCINE) 5000 UNIT/ML IJ SOLN
5000.0000 [IU] | INTRAMUSCULAR | Status: AC
  Administered 2015-03-06: 5000 [IU] via SUBCUTANEOUS
  Filled 2015-03-05: qty 1

## 2015-03-05 NOTE — Progress Notes (Signed)
Subjective: She is in good spirits, sipping her clears, large prolapse with some cramping but otherwise tolerating all of this well.    Objective: Vital signs in last 24 hours: Temp:  [98.7 F (37.1 C)-98.9 F (37.2 C)] 98.8 F (37.1 C) (08/31 0525) Pulse Rate:  [87-92] 87 (08/31 0525) Resp:  [16-17] 17 (08/31 0525) BP: (94-107)/(39-65) 103/39 mmHg (08/31 0525) SpO2:  [100 %] 100 % (08/31 0525) Last BM Date: 03/04/15 PO1200 200 from ostomy Afebrile, BP 90's to 100's range H/H is down some Intake/Output from previous day: 08/30 0701 - 08/31 0700 In: 1865 [P.O.:1200; I.V.:665] Out: 200 [Stool:200] Intake/Output this shift:    General appearance: alert, cooperative and no distress Resp: clear to auscultation bilaterally GI: soft, large prolapse, with fluid in the ostomy bag, it is mostly fluid, not much solid matter.  Lab Results:   Recent Labs  03/02/15 1230 03/04/15 0654  WBC 7.0 5.2  HGB 9.6* 8.3*  HCT 31.5* 27.2*  PLT 373 286    BMET  Recent Labs  03/02/15 1230 03/04/15 0654  NA 139 138  K 4.2 4.3  CL 113* 109  CO2 20* 23  GLUCOSE 89 100*  BUN 15 12  CREATININE 0.55 0.59  CALCIUM 10.8* 10.5*   PT/INR No results for input(s): LABPROT, INR in the last 72 hours.   Recent Labs Lab 03/04/15 0654  AST 16  ALT 9*  ALKPHOS 53  BILITOT 0.2*  PROT 7.1  ALBUMIN 3.0*     Lipase     Component Value Date/Time   LIPASE 33 08/17/2014 1201     Studies/Results: Ct Chest W Contrast  03/03/2015   CLINICAL DATA:  64 year old female with history of colon cancer diagnosed in 2014 status post resection. Metastatic disease to the liver. Restaging examination.  EXAM: CT CHEST, ABDOMEN, AND PELVIS WITH CONTRAST  TECHNIQUE: Multidetector CT imaging of the chest, abdomen and pelvis was performed following the standard protocol during bolus administration of intravenous contrast.  CONTRAST:  1 OMNIPAQUE IOHEXOL 300 MG/ML SOLN, 139mL OMNIPAQUE IOHEXOL 300 MG/ML  SOLN  COMPARISON:  CT of the abdomen and pelvis 11/13/2014. CT of the chest 11/13/2014.  FINDINGS: CT CHEST FINDINGS  Mediastinum/Lymph Nodes: Heart size is normal. There is no significant pericardial fluid, thickening or pericardial calcification. No pathologically enlarged mediastinal or hilar lymph nodes. Esophagus is unremarkable in appearance. No axillary lymphadenopathy. Right internal jugular single-lumen porta cath with tip terminating in the distal superior vena cava.  Lungs/Pleura: 5 mm pulmonary nodule in the left lower lobe (image 36 of series 4), unchanged compared to prior study CT the abdomen and pelvis 05/22/2013, considered benign. No other larger more suspicious appearing pulmonary nodules or masses are otherwise noted. No acute consolidative airspace disease. No pleural effusions.  Musculoskeletal/Soft Tissues: There are no aggressive appearing lytic or blastic lesions noted in the visualized portions of the skeleton.  CT ABDOMEN AND PELVIS FINDINGS  Hepatobiliary: Again noted is a large liver lesion centered in the right lobe of the liver measuring approximately 7.6 x 4.7 cm, which is similar in appearance to prior studies demonstrating early peripheral nodular enhancement with progressive centripetal filling, compatible with a cavernous hemangioma. However, today's study demonstrates a new 2.3 x 2.2 cm hypovascular lesion posterior to the cavernous hemangioma in segments 6/7 (image 57 of series 2), concerning for a new metastatic lesion. There is also a new hypovascular lesion in segment 2 (image 51 of series 2) measuring 2.5 x 2.3 cm. Several other smaller low-attenuation  lesions are noted, but are too small to definitively characterize, but similar to prior examinations (potentially small cysts). No intra or extrahepatic biliary ductal dilatation. Gallbladder is normal in appearance. Partially calcified lesion in segment 2 of the liver is unchanged, and may either represent a treated  metastatic lesion or a calcified granuloma. Other smaller cavernous hemangioma measuring 2.1 cm in segment 8 (image 48 of series 2) is similar to prior examinations.  Pancreas: No pancreatic mass. No pancreatic ductal dilatation. No pancreatic or peripancreatic fluid or inflammatory changes.  Spleen: Continued interval enlargement of what is now a 5.9 x 5.1 cm lesion in the anterior aspect of the spleen, which remains suspicious for potential metastatic lesion.  Adrenals/Urinary Tract: Nonobstructive calculi in the left renal collecting system measuring up to 8 mm in the lower pole. No hydroureteronephrosis. Urinary bladder is normal in appearance. Bilateral adrenal glands are normal in appearance.  Stomach/Bowel: The appearance of the stomach is unremarkable. No pathologic dilatation of small bowel or colon. Transverse colostomy with large peristomal hernia containing multiple loops of small bowel. Stent in the distal sigmoid colon extending into the proximal rectum, with a large amount of masslike soft tissue encircling the stent, encroaching upon the proximal aspect of the stent and invading into the adjacent mesocolon/mesorectum. This is particularly evident medially where there is a focal mass-like area measuring approximately 3.5 x 2.8 cm (image 104 of series 2).  Vascular/Lymphatic: Atherosclerosis throughout the abdominal and pelvic vasculature, without evidence of aneurysm or dissection. Multiple prominent perirectal lymph nodes noted, measuring up to 7 mm in the mesorectum. 8 mm short axis lymph node adjacent to the inferior mesenteric artery (image 83 of series 2) is conspicuous but nonspecific.  Reproductive: Status post hysterectomy. Ovaries are not confidently identified may be surgically absent or atrophic.  Other: Trace volume of ascites.  No pneumoperitoneum.  Musculoskeletal: There are no aggressive appearing lytic or blastic lesions noted in the visualized portions of the skeleton.  IMPRESSION: 1.  Large stent in place extending from the distal sigmoid colon into the proximal rectum traversing the known colorectal neoplasm which has significantly increased in size compared to the prior study, with greater extension into the surrounding mesorectal/mesocolic fat, with enlarging (but not technically enlarged) mesorectal lymph nodes and new liver lesions, as detailed above, compatible with new hepatic metastases. 2. Previously noted enhancing splenic lesion continues to enlarge and remains concerning for possible metastasis. 3. No definite signs of metastatic disease to the thorax at this time. 4. Nonobstructive calculi in the left renal collecting system measuring up to 8 mm in the lower pole. 5. Additional incidental findings, similar to prior studies, as above.   Electronically Signed   By: Vinnie Langton M.D.   On: 03/03/2015 15:46   Ct Abdomen Pelvis W Contrast  03/03/2015   CLINICAL DATA:  64 year old female with history of colon cancer diagnosed in 2014 status post resection. Metastatic disease to the liver. Restaging examination.  EXAM: CT CHEST, ABDOMEN, AND PELVIS WITH CONTRAST  TECHNIQUE: Multidetector CT imaging of the chest, abdomen and pelvis was performed following the standard protocol during bolus administration of intravenous contrast.  CONTRAST:  1 OMNIPAQUE IOHEXOL 300 MG/ML SOLN, 124mL OMNIPAQUE IOHEXOL 300 MG/ML SOLN  COMPARISON:  CT of the abdomen and pelvis 11/13/2014. CT of the chest 11/13/2014.  FINDINGS: CT CHEST FINDINGS  Mediastinum/Lymph Nodes: Heart size is normal. There is no significant pericardial fluid, thickening or pericardial calcification. No pathologically enlarged mediastinal or hilar lymph nodes. Esophagus is  unremarkable in appearance. No axillary lymphadenopathy. Right internal jugular single-lumen porta cath with tip terminating in the distal superior vena cava.  Lungs/Pleura: 5 mm pulmonary nodule in the left lower lobe (image 36 of series 4), unchanged compared  to prior study CT the abdomen and pelvis 05/22/2013, considered benign. No other larger more suspicious appearing pulmonary nodules or masses are otherwise noted. No acute consolidative airspace disease. No pleural effusions.  Musculoskeletal/Soft Tissues: There are no aggressive appearing lytic or blastic lesions noted in the visualized portions of the skeleton.  CT ABDOMEN AND PELVIS FINDINGS  Hepatobiliary: Again noted is a large liver lesion centered in the right lobe of the liver measuring approximately 7.6 x 4.7 cm, which is similar in appearance to prior studies demonstrating early peripheral nodular enhancement with progressive centripetal filling, compatible with a cavernous hemangioma. However, today's study demonstrates a new 2.3 x 2.2 cm hypovascular lesion posterior to the cavernous hemangioma in segments 6/7 (image 57 of series 2), concerning for a new metastatic lesion. There is also a new hypovascular lesion in segment 2 (image 51 of series 2) measuring 2.5 x 2.3 cm. Several other smaller low-attenuation lesions are noted, but are too small to definitively characterize, but similar to prior examinations (potentially small cysts). No intra or extrahepatic biliary ductal dilatation. Gallbladder is normal in appearance. Partially calcified lesion in segment 2 of the liver is unchanged, and may either represent a treated metastatic lesion or a calcified granuloma. Other smaller cavernous hemangioma measuring 2.1 cm in segment 8 (image 48 of series 2) is similar to prior examinations.  Pancreas: No pancreatic mass. No pancreatic ductal dilatation. No pancreatic or peripancreatic fluid or inflammatory changes.  Spleen: Continued interval enlargement of what is now a 5.9 x 5.1 cm lesion in the anterior aspect of the spleen, which remains suspicious for potential metastatic lesion.  Adrenals/Urinary Tract: Nonobstructive calculi in the left renal collecting system measuring up to 8 mm in the lower pole. No  hydroureteronephrosis. Urinary bladder is normal in appearance. Bilateral adrenal glands are normal in appearance.  Stomach/Bowel: The appearance of the stomach is unremarkable. No pathologic dilatation of small bowel or colon. Transverse colostomy with large peristomal hernia containing multiple loops of small bowel. Stent in the distal sigmoid colon extending into the proximal rectum, with a large amount of masslike soft tissue encircling the stent, encroaching upon the proximal aspect of the stent and invading into the adjacent mesocolon/mesorectum. This is particularly evident medially where there is a focal mass-like area measuring approximately 3.5 x 2.8 cm (image 104 of series 2).  Vascular/Lymphatic: Atherosclerosis throughout the abdominal and pelvic vasculature, without evidence of aneurysm or dissection. Multiple prominent perirectal lymph nodes noted, measuring up to 7 mm in the mesorectum. 8 mm short axis lymph node adjacent to the inferior mesenteric artery (image 83 of series 2) is conspicuous but nonspecific.  Reproductive: Status post hysterectomy. Ovaries are not confidently identified may be surgically absent or atrophic.  Other: Trace volume of ascites.  No pneumoperitoneum.  Musculoskeletal: There are no aggressive appearing lytic or blastic lesions noted in the visualized portions of the skeleton.  IMPRESSION: 1. Large stent in place extending from the distal sigmoid colon into the proximal rectum traversing the known colorectal neoplasm which has significantly increased in size compared to the prior study, with greater extension into the surrounding mesorectal/mesocolic fat, with enlarging (but not technically enlarged) mesorectal lymph nodes and new liver lesions, as detailed above, compatible with new hepatic metastases. 2. Previously noted  enhancing splenic lesion continues to enlarge and remains concerning for possible metastasis. 3. No definite signs of metastatic disease to the thorax at  this time. 4. Nonobstructive calculi in the left renal collecting system measuring up to 8 mm in the lower pole. 5. Additional incidental findings, similar to prior studies, as above.   Electronically Signed   By: Vinnie Langton M.D.   On: 03/03/2015 15:46    Medications: . [START ON 03/06/2015] alvimopan  12 mg Oral Once  . antiseptic oral rinse  7 mL Mouth Rinse BID  . [START ON 03/06/2015] bupivacaine liposome  20 mL Infiltration On Call to OR  . [START ON 03/06/2015] cefoTEtan (CEFOTAN) 2 GM IVPB  2 g Intravenous On Call to OR  . chlorhexidine  60 mL Topical Once   And  . chlorhexidine  60 mL Topical Once  . [START ON 03/06/2015] clindamycin / gentamicin INTRAPERITONEAL Lavage irrigation   Intraperitoneal To OR  . feeding supplement  1 Container Oral TID BM  . [START ON 03/06/2015] heparin subcutaneous  5,000 Units Subcutaneous On Call to OR  . lip balm  1 application Topical BID  . methocarbamol  500 mg Oral QID  . metroNIDAZOLE  1,000 mg Oral 3 times per day on Wed  . neomycin  1,000 mg Oral 3 times per day on Wed  . polyethylene glycol  34 g Oral BID   . 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       LOS: 2 days    Junette Bernat 03/05/2015

## 2015-03-05 NOTE — Progress Notes (Signed)
ANTICOAGULATION CONSULT NOTE - Initial Consult  Pharmacy Consult for heparin  Indication:hx pulmonary embolus  Allergies  Allergen Reactions  . Oxycontin [Oxycodone Hcl] Anaphylaxis, Hives and Itching  . Codeine Itching and Nausea And Vomiting    Patient Measurements: Height: 5\' 7"  (170.2 cm) Weight: 147 lb 4.3 oz (66.8 kg) IBW/kg (Calculated) : 61.6 Heparin Dosing Weight: 67 kg  Vital Signs: Temp: 98.8 F (37.1 C) (08/31 0525) Temp Source: Oral (08/31 0525) BP: 103/39 mmHg (08/31 0525) Pulse Rate: 87 (08/31 0525)  Labs:  Recent Labs  03/02/15 1230  03/03/15 1635 03/03/15 2240 03/04/15 0654 03/05/15 0440  HGB 9.6*  --   --   --  8.3*  --   HCT 31.5*  --   --   --  27.2*  --   PLT 373  --   --   --  286  --   APTT  --   --  32 36  --   --   HEPARINUNFRC  --   < > 0.26* 0.21* 0.43 0.47  CREATININE 0.55  --   --   --  0.59  --   < > = values in this interval not displayed.  Estimated Creatinine Clearance: 70.9 mL/min (by C-G formula based on Cr of 0.59).   Medical History: Past Medical History  Diagnosis Date  . Colon cancer     dx'd 2014  . Heart murmur     ? as a child   . Pulmonary embolism 11/2014   . Anemia     hx of     Medications:  See med rec  Assessment: Patient is a 64 y.o F with hx colon cancer-- s/p transverse loop colostomy on 10/28/14 for obstructing colon and revision of colostomy on 01/03/15.  She also as a hx of PE (11/13/14) on xarelto PTA. She presented to [facility] ED on 8/28 with prolapsed loop colostomy.  To bridge with heparin while xarelto is on hold.  - xarelto 20mg  daily PTA (last dose taken on 8/28 at 1830)  Significant events:  8/30: plan for surgical intervention on 9/01.  Per [facility], d/c heparin drip 6 hours prior to surgery --> d/c drip at 0200 on 9/1.  Heparin SQ 5000 units x1 pre-op at 0600 on 9/1 (per [facility]).  Today, 03/05/2015:  Heparin level is therapeutic at 0.47  No bleeding documented   Goal of Therapy:  Heparin level  0.3-0.7 units/ml Monitor platelets by anticoagulation protocol: Yes Plan:  - continue heparin drip at 1400 units/hr  - d/c heparin drip at 0200 on 9/1 - f/u with patient after OR tomorrow-->  Please advise pharmacy when you would like [place] to resume heparin after surgical procedure  Keenon Leitzel P 03/05/2015,9:52 AM

## 2015-03-06 ENCOUNTER — Encounter (HOSPITAL_COMMUNITY): Payer: Self-pay | Admitting: Anesthesiology

## 2015-03-06 ENCOUNTER — Inpatient Hospital Stay (HOSPITAL_COMMUNITY): Payer: Medicaid Other | Admitting: Anesthesiology

## 2015-03-06 ENCOUNTER — Encounter (HOSPITAL_COMMUNITY): Admission: EM | Disposition: A | Discharge status: Home Health | Payer: Self-pay | Source: Home / Self Care

## 2015-03-06 HISTORY — PX: SPLENECTOMY, TOTAL: SHX788

## 2015-03-06 HISTORY — PX: LAPAROSCOPIC SPLENECTOMY: SUR796

## 2015-03-06 HISTORY — PX: POLYPECTOMY: SHX5525

## 2015-03-06 HISTORY — PX: BOWEL RESECTION: SHX1257

## 2015-03-06 HISTORY — PX: LAPAROSCOPIC LOW ANTERIOR RESECTION: SHX5904

## 2015-03-06 HISTORY — PX: COLON RESECTION: SHX5231

## 2015-03-06 LAB — CBC
HCT: 26.2 % — ABNORMAL LOW (ref 36.0–46.0)
Hemoglobin: 8 g/dL — ABNORMAL LOW (ref 12.0–15.0)
MCH: 23.4 pg — AB (ref 26.0–34.0)
MCHC: 30.5 g/dL (ref 30.0–36.0)
MCV: 76.6 fL — AB (ref 78.0–100.0)
PLATELETS: 251 10*3/uL (ref 150–400)
RBC: 3.42 MIL/uL — ABNORMAL LOW (ref 3.87–5.11)
RDW: 16.5 % — AB (ref 11.5–15.5)
WBC: 5.1 10*3/uL (ref 4.0–10.5)

## 2015-03-06 LAB — MRSA PCR SCREENING: MRSA BY PCR: NEGATIVE

## 2015-03-06 LAB — PREPARE RBC (CROSSMATCH)

## 2015-03-06 SURGERY — COLON RESECTION LAPAROSCOPIC
Anesthesia: General

## 2015-03-06 MED ORDER — PHENYLEPHRINE HCL 10 MG/ML IJ SOLN
INTRAMUSCULAR | Status: DC | PRN
  Administered 2015-03-06 (×3): 80 ug via INTRAVENOUS
  Administered 2015-03-06: 40 ug via INTRAVENOUS

## 2015-03-06 MED ORDER — LACTATED RINGERS IV SOLN
INTRAVENOUS | Status: DC
  Administered 2015-03-06: 1000 mL via INTRAVENOUS
  Administered 2015-03-06 (×2): via INTRAVENOUS

## 2015-03-06 MED ORDER — PHENYLEPHRINE 40 MCG/ML (10ML) SYRINGE FOR IV PUSH (FOR BLOOD PRESSURE SUPPORT)
PREFILLED_SYRINGE | INTRAVENOUS | Status: AC
  Filled 2015-03-06: qty 10

## 2015-03-06 MED ORDER — ALBUMIN HUMAN 5 % IV SOLN
INTRAVENOUS | Status: DC | PRN
  Administered 2015-03-06: 12:00:00 via INTRAVENOUS

## 2015-03-06 MED ORDER — ONDANSETRON HCL 4 MG/2ML IJ SOLN
INTRAMUSCULAR | Status: AC
  Filled 2015-03-06: qty 2

## 2015-03-06 MED ORDER — PHENYLEPHRINE HCL 10 MG/ML IJ SOLN
INTRAMUSCULAR | Status: AC
  Filled 2015-03-06: qty 1

## 2015-03-06 MED ORDER — ACETAMINOPHEN 10 MG/ML IV SOLN
1000.0000 mg | Freq: Once | INTRAVENOUS | Status: AC
  Administered 2015-03-06: 1000 mg via INTRAVENOUS

## 2015-03-06 MED ORDER — LACTATED RINGERS IV SOLN
INTRAVENOUS | Status: DC
  Administered 2015-03-06: 1000 mL via INTRAVENOUS
  Administered 2015-03-06: 12:00:00 via INTRAVENOUS

## 2015-03-06 MED ORDER — ONDANSETRON HCL 4 MG/2ML IJ SOLN
4.0000 mg | Freq: Four times a day (QID) | INTRAMUSCULAR | Status: DC | PRN
  Administered 2015-03-07: 4 mg via INTRAVENOUS
  Filled 2015-03-06: qty 2

## 2015-03-06 MED ORDER — DIPHENHYDRAMINE HCL 12.5 MG/5ML PO ELIX
12.5000 mg | ORAL_SOLUTION | Freq: Four times a day (QID) | ORAL | Status: DC | PRN
  Administered 2015-03-11: 12.5 mg via ORAL
  Filled 2015-03-06: qty 5

## 2015-03-06 MED ORDER — NEOSTIGMINE METHYLSULFATE 10 MG/10ML IV SOLN
INTRAVENOUS | Status: DC | PRN
  Administered 2015-03-06: 3.5 mg via INTRAVENOUS

## 2015-03-06 MED ORDER — DEXAMETHASONE SODIUM PHOSPHATE 10 MG/ML IJ SOLN
INTRAMUSCULAR | Status: AC
  Filled 2015-03-06: qty 1

## 2015-03-06 MED ORDER — SODIUM CHLORIDE 0.9 % IV SOLN: Freq: Once | INTRAVENOUS | Status: DC

## 2015-03-06 MED ORDER — BUPIVACAINE-EPINEPHRINE 0.25% -1:200000 IJ SOLN
INTRAMUSCULAR | Status: AC
  Filled 2015-03-06: qty 1

## 2015-03-06 MED ORDER — PROPOFOL 10 MG/ML IV BOLUS
INTRAVENOUS | Status: AC
  Filled 2015-03-06: qty 20

## 2015-03-06 MED ORDER — PROCHLORPERAZINE MALEATE 10 MG PO TABS: 10.0000 mg | ORAL_TABLET | Freq: Four times a day (QID) | ORAL | Status: DC | PRN

## 2015-03-06 MED ORDER — DEXAMETHASONE SODIUM PHOSPHATE 10 MG/ML IJ SOLN
INTRAMUSCULAR | Status: DC | PRN
  Administered 2015-03-06: 10 mg via INTRAVENOUS

## 2015-03-06 MED ORDER — LIDOCAINE-PRILOCAINE 2.5-2.5 % EX CREA: 1.0000 "application " | TOPICAL_CREAM | CUTANEOUS | Status: DC | PRN

## 2015-03-06 MED ORDER — FENTANYL CITRATE (PF) 100 MCG/2ML IJ SOLN
INTRAMUSCULAR | Status: DC | PRN
  Administered 2015-03-06: 100 ug via INTRAVENOUS
  Administered 2015-03-06: 50 ug via INTRAVENOUS
  Administered 2015-03-06: 100 ug via INTRAVENOUS

## 2015-03-06 MED ORDER — ACETAMINOPHEN 10 MG/ML IV SOLN
INTRAVENOUS | Status: AC
  Filled 2015-03-06: qty 100

## 2015-03-06 MED ORDER — ONDANSETRON HCL 4 MG PO TABS
4.0000 mg | ORAL_TABLET | Freq: Four times a day (QID) | ORAL | Status: DC | PRN
  Administered 2015-03-10: 4 mg via ORAL
  Filled 2015-03-06: qty 1

## 2015-03-06 MED ORDER — DIPHENHYDRAMINE HCL 50 MG/ML IJ SOLN: 12.5000 mg | Freq: Four times a day (QID) | INTRAMUSCULAR | Status: DC | PRN

## 2015-03-06 MED ORDER — PROMETHAZINE HCL 25 MG/ML IJ SOLN: 6.2500 mg | INTRAMUSCULAR | Status: DC | PRN

## 2015-03-06 MED ORDER — SODIUM CHLORIDE 0.9 % IV SOLN
INTRAVENOUS | Status: AC
  Administered 2015-03-06: 1000 mL via INTRAPERITONEAL
  Filled 2015-03-06: qty 6

## 2015-03-06 MED ORDER — HYDROMORPHONE HCL 2 MG PO TABS: 2.0000 mg | ORAL_TABLET | ORAL | Status: DC | PRN

## 2015-03-06 MED ORDER — ALBUMIN HUMAN 5 % IV SOLN
INTRAVENOUS | Status: AC
  Filled 2015-03-06: qty 250

## 2015-03-06 MED ORDER — STERILE WATER FOR IRRIGATION IR SOLN
Status: DC | PRN
  Administered 2015-03-06: 1000 mL

## 2015-03-06 MED ORDER — BUPIVACAINE-EPINEPHRINE 0.25% -1:200000 IJ SOLN
INTRAMUSCULAR | Status: DC | PRN
  Administered 2015-03-06: 50 mL

## 2015-03-06 MED ORDER — PROPOFOL 10 MG/ML IV BOLUS
INTRAVENOUS | Status: DC | PRN
  Administered 2015-03-06: 120 mg via INTRAVENOUS

## 2015-03-06 MED ORDER — GLYCOPYRROLATE 0.2 MG/ML IJ SOLN
INTRAMUSCULAR | Status: DC | PRN
  Administered 2015-03-06: 0.4 mg via INTRAVENOUS

## 2015-03-06 MED ORDER — DEXTROSE 5 % IV SOLN
2.0000 g | Freq: Two times a day (BID) | INTRAVENOUS | Status: AC
  Administered 2015-03-06: 2 g via INTRAVENOUS
  Filled 2015-03-06: qty 2

## 2015-03-06 MED ORDER — LIDOCAINE HCL (CARDIAC) 20 MG/ML IV SOLN
INTRAVENOUS | Status: AC
  Filled 2015-03-06: qty 5

## 2015-03-06 MED ORDER — SACCHAROMYCES BOULARDII 250 MG PO CAPS
250.0000 mg | ORAL_CAPSULE | Freq: Two times a day (BID) | ORAL | Status: DC
  Administered 2015-03-06 – 2015-03-12 (×12): 250 mg via ORAL
  Filled 2015-03-06 (×14): qty 1

## 2015-03-06 MED ORDER — MIDAZOLAM HCL 5 MG/5ML IJ SOLN
INTRAMUSCULAR | Status: DC | PRN
  Administered 2015-03-06 (×2): 1 mg via INTRAVENOUS

## 2015-03-06 MED ORDER — ALUM & MAG HYDROXIDE-SIMETH 200-200-20 MG/5ML PO SUSP: 30.0000 mL | Freq: Four times a day (QID) | ORAL | Status: DC | PRN

## 2015-03-06 MED ORDER — SODIUM CHLORIDE 0.9 % IV SOLN: INTRAVENOUS | Status: DC

## 2015-03-06 MED ORDER — SUCCINYLCHOLINE CHLORIDE 20 MG/ML IJ SOLN
INTRAMUSCULAR | Status: DC | PRN
  Administered 2015-03-06: 100 mg via INTRAVENOUS

## 2015-03-06 MED ORDER — MIDAZOLAM HCL 2 MG/2ML IJ SOLN
INTRAMUSCULAR | Status: AC
  Filled 2015-03-06: qty 4

## 2015-03-06 MED ORDER — PHENYLEPHRINE 40 MCG/ML (10ML) SYRINGE FOR IV PUSH (FOR BLOOD PRESSURE SUPPORT)
PREFILLED_SYRINGE | INTRAVENOUS | Status: AC
Start: 2015-03-06 — End: 2015-03-06
  Filled 2015-03-06: qty 10

## 2015-03-06 MED ORDER — SODIUM CHLORIDE 0.9 % IV SOLN
INTRAVENOUS | Status: DC
  Administered 2015-03-06 – 2015-03-07 (×2): via INTRAVENOUS

## 2015-03-06 MED ORDER — ALVIMOPAN 12 MG PO CAPS
12.0000 mg | ORAL_CAPSULE | Freq: Two times a day (BID) | ORAL | Status: DC
  Administered 2015-03-07 – 2015-03-09 (×5): 12 mg via ORAL
  Filled 2015-03-06 (×6): qty 1

## 2015-03-06 MED ORDER — HYDROMORPHONE HCL 1 MG/ML IJ SOLN
0.5000 mg | INTRAMUSCULAR | Status: DC | PRN
  Administered 2015-03-06 – 2015-03-07 (×3): 1 mg via INTRAVENOUS
  Administered 2015-03-07: 2 mg via INTRAVENOUS
  Administered 2015-03-07 (×6): 1 mg via INTRAVENOUS
  Administered 2015-03-08: 2 mg via INTRAVENOUS
  Administered 2015-03-08: 1 mg via INTRAVENOUS
  Administered 2015-03-08 (×2): 2 mg via INTRAVENOUS
  Administered 2015-03-08 (×4): 1 mg via INTRAVENOUS
  Administered 2015-03-08: 2 mg via INTRAVENOUS
  Administered 2015-03-08: 1 mg via INTRAVENOUS
  Administered 2015-03-09: 2 mg via INTRAVENOUS
  Administered 2015-03-09: 1 mg via INTRAVENOUS
  Administered 2015-03-09 (×2): 2 mg via INTRAVENOUS
  Administered 2015-03-09: 1 mg via INTRAVENOUS
  Administered 2015-03-09: 2 mg via INTRAVENOUS
  Administered 2015-03-09 – 2015-03-10 (×3): 1 mg via INTRAVENOUS
  Administered 2015-03-10 (×2): 2 mg via INTRAVENOUS
  Filled 2015-03-06: qty 2
  Filled 2015-03-06 (×2): qty 1
  Filled 2015-03-06: qty 2
  Filled 2015-03-06 (×2): qty 1
  Filled 2015-03-06: qty 2
  Filled 2015-03-06: qty 1
  Filled 2015-03-06: qty 2
  Filled 2015-03-06 (×10): qty 1
  Filled 2015-03-06: qty 2
  Filled 2015-03-06: qty 1
  Filled 2015-03-06: qty 2
  Filled 2015-03-06: qty 1
  Filled 2015-03-06: qty 2
  Filled 2015-03-06 (×2): qty 1
  Filled 2015-03-06 (×2): qty 2
  Filled 2015-03-06 (×2): qty 1
  Filled 2015-03-06: qty 2
  Filled 2015-03-06: qty 1

## 2015-03-06 MED ORDER — ACETAMINOPHEN 500 MG PO TABS
1000.0000 mg | ORAL_TABLET | Freq: Three times a day (TID) | ORAL | Status: DC
  Administered 2015-03-06 – 2015-03-12 (×18): 1000 mg via ORAL
  Filled 2015-03-06 (×21): qty 2

## 2015-03-06 MED ORDER — ROCURONIUM BROMIDE 100 MG/10ML IV SOLN
INTRAVENOUS | Status: DC | PRN
  Administered 2015-03-06: 20 mg via INTRAVENOUS
  Administered 2015-03-06: 40 mg via INTRAVENOUS
  Administered 2015-03-06 (×2): 20 mg via INTRAVENOUS

## 2015-03-06 MED ORDER — HYDROMORPHONE HCL 1 MG/ML IJ SOLN: 0.2500 mg | INTRAMUSCULAR | Status: DC | PRN

## 2015-03-06 MED ORDER — ENOXAPARIN SODIUM 40 MG/0.4ML ~~LOC~~ SOLN: 40.0000 mg | SUBCUTANEOUS | Status: DC

## 2015-03-06 MED ORDER — FENTANYL CITRATE (PF) 250 MCG/5ML IJ SOLN
INTRAMUSCULAR | Status: AC
  Filled 2015-03-06: qty 25

## 2015-03-06 MED ORDER — LACTATED RINGERS IV BOLUS (SEPSIS): 1000.0000 mL | Freq: Three times a day (TID) | INTRAVENOUS | Status: AC | PRN

## 2015-03-06 MED ORDER — CEFOTETAN DISODIUM-DEXTROSE 2-2.08 GM-% IV SOLR
INTRAVENOUS | Status: AC
  Filled 2015-03-06: qty 50

## 2015-03-06 MED ORDER — 0.9 % SODIUM CHLORIDE (POUR BTL) OPTIME
TOPICAL | Status: DC | PRN
  Administered 2015-03-06: 2000 mL

## 2015-03-06 MED ORDER — LIDOCAINE HCL (CARDIAC) 20 MG/ML IV SOLN
INTRAVENOUS | Status: DC | PRN
  Administered 2015-03-06: 40 mg via INTRAVENOUS

## 2015-03-06 SURGICAL SUPPLY — 78 items
APPLIER CLIP 5 13 M/L LIGAMAX5 (MISCELLANEOUS)
APPLIER CLIP ROT 10 11.4 M/L (STAPLE)
APR CLP MED LRG 11.4X10 (STAPLE)
APR CLP MED LRG 5 ANG JAW (MISCELLANEOUS)
BLADE EXTENDED COATED 6.5IN (ELECTRODE) ×2 IMPLANT
BLADE HEX COATED 2.75 (ELECTRODE) ×6 IMPLANT
CABLE HIGH FREQUENCY MONO STRZ (ELECTRODE) ×4 IMPLANT
CATH KIT ON-Q SILVERSOAK 7.5 (CATHETERS) IMPLANT
CATH KIT ON-Q SILVERSOAK 7.5IN (CATHETERS) IMPLANT
CELLS DAT CNTRL 66122 CELL SVR (MISCELLANEOUS) IMPLANT
CLIP APPLIE 5 13 M/L LIGAMAX5 (MISCELLANEOUS) IMPLANT
CLIP APPLIE ROT 10 11.4 M/L (STAPLE) IMPLANT
COUNTER NEEDLE 20 DBL MAG RED (NEEDLE) ×4 IMPLANT
COVER SURGICAL LIGHT HANDLE (MISCELLANEOUS) ×2 IMPLANT
DECANTER SPIKE VIAL GLASS SM (MISCELLANEOUS) ×4 IMPLANT
DRAIN CHANNEL 19F RND (DRAIN) IMPLANT
DRAPE LAPAROSCOPIC ABDOMINAL (DRAPES) ×4 IMPLANT
DRAPE UTILITY XL STRL (DRAPES) ×8 IMPLANT
DRSG OPSITE POSTOP 4X10 (GAUZE/BANDAGES/DRESSINGS) IMPLANT
DRSG OPSITE POSTOP 4X6 (GAUZE/BANDAGES/DRESSINGS) IMPLANT
DRSG OPSITE POSTOP 4X8 (GAUZE/BANDAGES/DRESSINGS) IMPLANT
DRSG TEGADERM 2-3/8X2-3/4 SM (GAUZE/BANDAGES/DRESSINGS) ×12 IMPLANT
DRSG TEGADERM 4X4.75 (GAUZE/BANDAGES/DRESSINGS) ×2 IMPLANT
ELECT REM PT RETURN 9FT ADLT (ELECTROSURGICAL) ×4
ELECTRODE REM PT RTRN 9FT ADLT (ELECTROSURGICAL) ×2 IMPLANT
ENDOLOOP SUT PDS II  0 18 (SUTURE)
ENDOLOOP SUT PDS II 0 18 (SUTURE) IMPLANT
GAUZE SPONGE 2X2 8PLY STRL LF (GAUZE/BANDAGES/DRESSINGS) IMPLANT
GAUZE SPONGE 4X4 12PLY STRL (GAUZE/BANDAGES/DRESSINGS) ×2 IMPLANT
GLOVE BIO SURGEON STRL SZ 6 (GLOVE) ×4 IMPLANT
GLOVE ECLIPSE 8.0 STRL XLNG CF (GLOVE) ×10 IMPLANT
GLOVE INDICATOR 6.5 STRL GRN (GLOVE) ×4 IMPLANT
GLOVE INDICATOR 8.0 STRL GRN (GLOVE) ×10 IMPLANT
GOWN STRL REUS W/TWL XL LVL3 (GOWN DISPOSABLE) ×20 IMPLANT
LEGGING LITHOTOMY PAIR STRL (DRAPES) ×4 IMPLANT
LUBRICANT JELLY K Y 4OZ (MISCELLANEOUS) IMPLANT
PACK COLON (CUSTOM PROCEDURE TRAY) ×4 IMPLANT
RELOAD AUTO 90-3.5 TA90 BLE (ENDOMECHANICALS) ×4 IMPLANT
RELOAD BLUE CHELON 45 (STAPLE) ×2 IMPLANT
RELOAD STAPLE 60 2.6 WHT THN (STAPLE) IMPLANT
RELOAD STAPLE 90 BLU REG (ENDOMECHANICALS) IMPLANT
RELOAD STAPLER WHITE 60MM (STAPLE) ×2 IMPLANT
RETRACTOR WND ALEXIS 18 MED (MISCELLANEOUS) IMPLANT
RTRCTR WOUND ALEXIS 18CM MED (MISCELLANEOUS)
SCISSORS LAP 5X35 DISP (ENDOMECHANICALS) ×4 IMPLANT
SEALER TISSUE G2 STRG ARTC 35C (ENDOMECHANICALS) IMPLANT
SET IRRIG TUBING LAPAROSCOPIC (IRRIGATION / IRRIGATOR) ×4 IMPLANT
SLEEVE XCEL OPT CAN 5 100 (ENDOMECHANICALS) ×10 IMPLANT
SPONGE GAUZE 2X2 STER 10/PKG (GAUZE/BANDAGES/DRESSINGS) ×2
SPONGE LAP 18X18 X RAY DECT (DISPOSABLE) ×2 IMPLANT
STAPLE ECHEON FLEX 60 POW ENDO (STAPLE) ×4 IMPLANT
STAPLER PROXIMATE 75MM BLUE (STAPLE) ×2 IMPLANT
STAPLER RELOAD WHITE 60MM (STAPLE) ×4
STAPLER VISISTAT 35W (STAPLE) ×4 IMPLANT
SUT MNCRL AB 4-0 PS2 18 (SUTURE) ×4 IMPLANT
SUT PDS AB 1 CT1 27 (SUTURE) ×4 IMPLANT
SUT PDS AB 1 CTX 36 (SUTURE) IMPLANT
SUT PDS AB 1 TP1 96 (SUTURE) IMPLANT
SUT PROLENE 0 CT 2 (SUTURE) IMPLANT
SUT SILK 2 0 (SUTURE) ×4
SUT SILK 2 0 SH CR/8 (SUTURE) ×4 IMPLANT
SUT SILK 2-0 18XBRD TIE 12 (SUTURE) ×2 IMPLANT
SUT SILK 3 0 (SUTURE) ×4
SUT SILK 3 0 SH CR/8 (SUTURE) ×4 IMPLANT
SUT SILK 3-0 18XBRD TIE 12 (SUTURE) ×2 IMPLANT
SYS LAPSCP GELPORT 120MM (MISCELLANEOUS) ×4
SYSTEM LAPSCP GELPORT 120MM (MISCELLANEOUS) IMPLANT
TAPE UMBILICAL COTTON 1/8X30 (MISCELLANEOUS) ×2 IMPLANT
TOWEL OR NON WOVEN STRL DISP B (DISPOSABLE) ×8 IMPLANT
TRAY FOLEY W/METER SILVER 14FR (SET/KITS/TRAYS/PACK) ×4 IMPLANT
TRAY FOLEY W/METER SILVER 16FR (SET/KITS/TRAYS/PACK) ×2 IMPLANT
TROCAR BLADELESS OPT 5 100 (ENDOMECHANICALS) ×4 IMPLANT
TROCAR XCEL 12X100 BLDLESS (ENDOMECHANICALS) ×2 IMPLANT
TROCAR XCEL NON-BLD 11X100MML (ENDOMECHANICALS) ×2 IMPLANT
TUBING CONNECTING 10 (TUBING) IMPLANT
TUBING CONNECTING 10' (TUBING)
TUBING FILTER THERMOFLATOR (ELECTROSURGICAL) ×4 IMPLANT
TUNNELER SHEATH ON-Q 16GX12 DP (PAIN MANAGEMENT) IMPLANT

## 2015-03-06 NOTE — Op Note (Signed)
03/06/2015  1:05 PM  PATIENT:  [name] [name]  64 y.o. 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  Metastatic colon cancer\ Parastomal hernia  PROCEDURE:   LAPAROSCOPIC LOW ANTERIOR RECTOSIGMOID RESECTION LAPAROSCOPIC SPLENECTOMY TAKEDOWN LOOP COLOSTOMY VAGINAL CUFF PERITONEAL BIOPSY COLON POLYPECTOMY x 4 RIGID PROCTOSCOPY  SURGEON:  Surgeon(s): Michael Boston, MD Stark Klein, MD Assist  ANESTHESIA:   local and general  EBL:  Total I/O In: 1540 [I.V.:2000; IV Piggyback:250] Out: 185 [Urine:85; Blood:100]  Delay start of Pharmacological VTE agent (>24hrs) due to surgical blood loss or risk of bleeding:  no  DRAINS: none   SPECIMEN:  Source of Specimen:    1.  Rectosigmoid colon.  Open end proximal. 2.  Anastomotic rings.  Blue stitch in proximal ring. 3.  Spleen with metastasis. 4.  Transverse colon polyps 4. 5.  Vaginal cuff peritoneal biopsy.  Frozen consistent with granulation tissue and no cancer.  DISPOSITION OF SPECIMEN:  PATHOLOGY  COUNTS:  YES  PLAN OF CARE: Admit to inpatient   PATIENT DISPOSITION:  PACU - hemodynamically stable.  INDICATION:    Pleasant woman with obstructing rectosigmoid cancer diagnosed November 2014.  She is status post palliative stenting 2 with recurrent obstruction.  Has been undergoing palliative chemotherapy.  Underwent palliative diverting transverse colostomy.  Unfortunately she has she has had recurrent prolapse despite resection & parastomal repair.  Her distant disease has been under control.  I  recommended possible relocation to ascending colostomy versus resection and primary.  Possible concurrent splenectomy if technically feasible and primary cancer resected.  The anatomy & physiology of the digestive tract was discussed.  The pathophysiology was discussed.  Natural history risks without surgery was discussed.   I worked to give an overview of the disease and the frequent need to have multispecialty involvement.  I feel the risks of no intervention will lead to serious problems that outweigh the operative risks; therefore, I recommended a partial colectomy to remove the pathology.  Laparoscopic & open techniques were discussed.   Risks such as bleeding, infection, abscess, leak, reoperation, possible ostomy, hernia, heart attack, death, and other risks were discussed.  I noted a good likelihood this will help address the problem.   Goals of post-operative recovery were discussed as well.  We will work to minimize complications.  Educational materials on the pathology had been given in the office.  Questions were answered.    The anatomy and physiology of the spleen was discussed.  Pathophysiology of the disease was discussed.  Options were discussed, and I made a recommendation to remove the spleen to help treat the pathology.  Minimally invasive & open techniques discussed.  Risks of bleeding, infection, injury to other organs, reoperation, death, and other risks were discussed.   I noted a good likelihood this will help address the problem.  While there are risks, I feel the risks of nonoperative management are greater; therefore, I feel surgery offers the best option. Educational material was available.  We will work to minimize complications.   The patient expressed understanding & wished to proceed with surgery.  OR FINDINGS:   Patient had a bulky rectosigmoid mass  adherent to vaginal cuff.  Most adhesions easily removed.  Frozen section of vaginal cuff adhesion consistent with  granulation tissue and not cancer.  Therefore, primary cancer able to be resected.  Since there was no major bleeding, nutrition was okay, and no evidence of obstruction; we proceeded with anastomosis.  The anastomosis rests 11 cm from the anal verge by rigid proctoscopy.  Lower pole of spleen replaced with tumor.  Spleen quite mobile with no adhesions.  Able to resect spleen.  2 small accessory spleens left in situ with hilar vessel stump  Loop colostomy with proximal colon prolapse most likely due to cecal bascule.  Intussusception of proximal colon reduced and left intact.  Transverse colostomy closed with side-to-side anastomosis.    Primary parastomal hernia repair.  Fascia is closed.  Wound VAC in subcutaneous tissues.  Obvious metastatic disease on liver.  Especially on right posterior lateral hepatic lobe.  Segments 6 and 7.  Also Segment 4 just left lateral to the proximal falciform ligament.  DESCRIPTION:   Informed consent was confirmed.  The patient underwent general anaesthesia without difficulty.  The patient was positioned appropriately.  VTE prevention in place.  I did digital rectal examination.  At the tip by finger I could feel the stent in the proximal/mid rectum.  The patient's abdomen & perineum was clipped, prepped, & draped in a sterile fashion.  Surgical timeout confirmed our plan.  Patient had 4 pedunculated polyps on the transverse colon.  I transected those with cautery.  Largest one was 1 cm.  They appeared adenomatous.  I was not able to reduce the chronically intussuscepted transverse colostomy.  I therefore resected around the colostomy with a biconcave skin incision.  I entered into the subcutaneous tissues.  Was able enter into the peritoneal cavity right lateral.  Eventually freed adhesions of the transverse colon colostomy off the anterior abdominal wall.  Was able to eviscerate the proximal colon and terminal ileum.  Gradually reduced cecum and ascending colon out  of the intussusception.  There is no necrosis or ischemia.  Tissues looked viable.  Because the defect was 80%, I decided to do a side to side transverse to transverse colon anastomosis with a 75 GIA stapler.  I skeletonized mesentery and stapled off the common defect with a TX 90 stapler.  Mesentery and nearby omentum patched over the TX staple line.  Wide-open anastomosis.  Anastomosis reduced in the abdomen.  Gloves changed.  Patient had a moderate sized parastomal hernia.  I placed a GelPort wound protector through this.  Had to extend the fascia 1 cm to allow my hand to go in.  I did hand-assisted laparoscopic exploration.  Can feel the metastasis on the liver and spleen.  No breaches through the visceral peritoneum.  Can feel the enlarged rectosigmoid tumor.  Some wispy adhesions to the left pelvic rim.  More dense adhesions to the vaginal cuff.  No evidence of carcinomatosis.  No evidence of perforation.  Extra laparoscopic ports carefully placed.  Side to proceed with transection of the obstructing primary rectosigmoid cancer since there is no carcinomatosis.  I used scissors and transected the peritoneum off the vaginal cuff to free the rectosigmoid colon anteriorly.  I sent a small piece of peritoneum in the region off for frozen.  Consistent with granulation tissue and not cancer.  Therefore negative vaginal cuff margin.  I reflected the greater omentum and the upper abdomen the small bowel in the upper abdomen. I scored the base of peritoneum  of the right side of the mesentery of the left colon from the ligament of Treitz to the peritoneal reflection of the mid rectum.  I elevated the sigmoid mesentery and enetered into the retro-mesenteric plane. We were able to identify the left ureter and gonadal vessels. We kept those posterior within the retroperitoneum and elevated the left colon mesentery off that. I did isolate the IMA pedicle but did not ligate it yet.  I continued distally and got into the  avascular plane posterior to the mesorectum. This allowed me to help mobilize the rectum as well by freeing the mesorectum off the sacrum.  I mobilized the peritoneal coverings towards the peritoneal reflection on both the right and left sides of the rectum.  I could see the right and left ureters and stayed away from them.  I kept the lateral vascular pedicles to the rectum intact.  I skeletonized the mesorectum at the junction at the proximal rectum using blunt dissection & bipolar EnSeal.  I mobilized the left colon in a lateral to medial fashion off the line of Toldt up towards the splenic flexure to ensure good mobilization of the left colon to reach into the pelvis.  Freddrick March off retroperitoneal adhesions to left kidney and part of the splenic flexure and inferior pancreatic ridge.  At least a partial splenic flexure mobilization.  The mid descending colon could reach down to the pelvis.  I therefore decide to proceed with transection.  I transected the mesorectum at the proximal/mid rectum about 5 cm distal to the large mass and stent.  Transected at the mid rectum using a single firing of a laparoscopic stapler.  I brought the descending colon down to the rectal cuff.  I transected the left colon mesentery in a radial fashion to that point.  Came across the superior hemorrhoidal artery but tried to leave most of the left colic pedicle intact since this was a palliative resection.   I was able to eviscerate the rectosigmoid and descending colon out the wound.  I found the transected mesentery at the mid- descending colon that was soft and easily reached down. I clamped the colon proximal to this area using a soft bowel clamp. I transected at the descending/sigmoid junction with a scalpel. I got healthy bleeding mucosa. I transected the remaining specimen mesentery in a radial fashion to preserve good blood supply to the proximal colon end.  We sent the rectosigmoid colon specimen off to go to  pathology.  We sized the colon orifice.  I chose a 29 EEA anvil stapler system. I placed the anvil to the open end of the descending colon and closed around it using a 0 Prolene pursestring.  We did copious irrigation with crystalloid solution.  Hemostasis was good.  The distal end of the colon at the handle easily reached down to the rectal stump, therefore, futhrt splenic flexure mobilization was  not needed.      Dr Barry Dienes scrubbed down and did gentle anal dilation and advanced the EEA stapler up the rectal stump. The spike was brought out at the provimal end of the rectal stump under direct visualization.  I attached the anvil of the proximal colon the spike of the stapler. Anvil was tightened down and held clamped for 60 seconds. The EEA stapler was fired and held clamped for 30 seconds. The stapler was released & removed. We noted 2 excellent anastomotic rings. Blue stitch is in the proximal ring.  Dr Barry Dienes did rigid proctoscopy noted the anastomosis  was at 11 cm from the anal verge consistent with the proximal rectum.  We did a final irrigation of antibiotic solution (900 mg clindamycin/240 mg gentamicin in a liter of crystalloid) & held that for the pelvic air leak test .  The rectum was insufflated the rectum while clamping the colon proximal to that anastomosis.  There was a negative air leak test. There was no tension of mesentery or bowel at the anastomosis.   Tissues looked viable.    We changed gloves.  We did diagnostic laparoscopy.  We aspirated the antibiotic irrigation.  Hemostasis was good.   Ureters & bowel uninjured.  The anastomosis looked healthy.  I proceeded with laparoscopic splenectomy.  I completed transection of  Splenocolic attachments with a bipolar EnSeal.  I transected the short gastric vessels off the spleen.   I mobilized the spleen in a lateral to medial fashion.  I skeletonized the Hilar pedicle, ligating/transecting a few outlyig smaller splenic veins.  I found 2 small  accessory spleens that I left in place at the hilum.  I staped off the hilum to ligate/transect the splenic arterial & vein pedicle to good result.  Spleen with obvious contained large metastasis removed.  Hemostasis was good.  We removed ports & wound protector.  We changed gown and gloves.  The patient was re-draped.  Sterile unused instruments were used from this point out per colon SSI prevention protocol.  We closed the 35mm port sites using Monocryl stitch and sterile dressing.  I transected old skin at the colostomy to fresh tissue.  I closed the RUQ colostomy parastomal hernia wound transversely with  #1 PDS transverse fascial closure.  Wound irrigated serially & closed with a small wound vac.  Patient was being extubated to recovery room. I discussed postop care with the patient in detail the office & in the holding area. Instructions are written. I updated the status of the patient to the patient's family & friends.  I made recommendations.  I answered questions.  Understanding & appreciation was expressed.   Adin Hector, M.D., F.A.C.S. Gastrointestinal and Minimally Invasive Surgery [facility] [address] [place], [name] [address] [phone] Main / Paging

## 2015-03-06 NOTE — Progress Notes (Signed)
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  Try resection of primary with anastomosis/new ostomy vs relocation to descending colostomy, possible splenectomy if primary can be removed.  The anatomy & physiology of the digestive tract was discussed.  The pathophysiology of the colon was discussed.  Natural history risks without surgery was discussed.   I feel the risks of no intervention will lead to serious problems that outweigh the operative risks; therefore, I recommended a partial colectomy to remove the pathology.  Minimally invasive (Robotic/Laparoscopic) & open techniques were discussed.   Risks such as bleeding, infection, abscess, leak, reoperation, possible ostomy, hernia, heart attack, stroke, death, and other risks were discussed.  I noted a good likelihood this will help address the problem.   Goals of post-operative recovery were discussed as well.   Need for adequate nutrition, daily bowel regimen and healthy physical activity, to optimize recovery was noted as well. We will work to minimize complications.  Educational materials were available as well.  Questions were answered.  The patient expresses understanding & wishes to proceed with surgery.  The anatomy and physiology of the spleen was discussed.  Pathophysiology of the disease was discussed.  Options were discussed, and I made a recommendation to remove the spleen to help treat the pathology.  Minimally invasive & open techniques  discussed.  Risks of bleeding, infection, injury to other organs, reoperation, death, and other risks were discussed.   I noted a good likelihood this will help address the problem.  While there are risks, I feel the risks of nonoperative management are greater; therefore, I feel surgery offers the best option. Educational material was available.  We will work to minimize complications.   -h/o PE May - plan restart anticoagulation ~12 hours after surgery if things go well.  No later than 48 hours if possible.  Low dose enoxaparin if full anticoag not feasible.  -VTE prophylaxis- SCDs, etc -mobilize as tolerated to help recovery  Adin Hector, M.D., F.A.C.S. Gastrointestinal and Minimally Invasive Surgery [facility] [address] [place],  [address] [phone] Main / Paging   03/06/2015  Subjective:  No events Motivated for surgery  Objective:  Vital signs:  Filed Vitals:   03/05/15 1458 03/05/15 1615 03/05/15 2049 03/06/15 0434  BP: 97/43 118/72 99/47 103/40  Pulse: 98 99 82 79  Temp: 98.6 F (37 C)  98.3 F (36.8 C) 98.4 F (36.9 C)  TempSrc:   Oral Oral  Resp: 18  18 16   Height:      Weight:      SpO2: 100%  100% 100%    Last BM Date: 03/05/15  Intake/Output   Yesterday:  08/31 0701 - 09/01 0700 In: 2350.7 [P.O.:720; I.V.:1630.7] Out: 1650 [Urine:1400; Stool:250] This shift:     Bowel function:  Flatus: y  BM: thin effluent in bag  Drain: n/a  Physical Exam:  General: Pt awake/alert/oriented x4 in no acute distress Eyes: PERRL, normal EOM.  Sclera clear.  No icterus Neuro: CN II-XII intact w/o focal sensory/motor deficits. Lymph: No head/neck/groin lymphadenopathy Psych:  No delerium/psychosis/paranoia HENT: Normocephalic, Mucus membranes moist.  No thrush Neck: Supple, No tracheal deviation Chest: No chest wall pain w good excursion CV:  Pulses intact.  Regular rhythm MS: Normal AROM mjr joints.   No obvious deformity Abdomen: Soft.  Nondistended.  Sensitive at large prolapsed but reducible RUQ loop colostomy.  No evidence of peritonitis.  No incarcerated hernias. Ext:  SCDs BLE.  No mjr edema.  No cyanosis Skin: No petechiae / purpura  Results:   Labs: Results for orders placed or performed during the hospital encounter of 03/02/15 (from the past 48 hour(s))  Heparin level (unfractionated)     Status: None   Collection Time: 03/05/15  4:40 AM  Result Value Ref Range   Heparin Unfractionated 0.47 0.30 - 0.70 IU/mL    Comment:        IF HEPARIN RESULTS ARE BELOW EXPECTED VALUES, AND PATIENT DOSAGE HAS BEEN CONFIRMED, SUGGEST FOLLOW UP TESTING OF ANTITHROMBIN III LEVELS.   Type and screen     Status: None   Collection Time: 03/05/15 12:40 PM  Result Value Ref Range   ABO/RH(D) AB POS    Antibody Screen NEG    Sample Expiration 03/08/2015   CBC     Status: Abnormal   Collection Time: 03/06/15  4:58 AM  Result Value Ref Range   WBC 5.1 4.0 - 10.5 K/uL   RBC 3.42 (L) 3.87 - 5.11 MIL/uL   Hemoglobin 8.0 (L) 12.0 - 15.0 g/dL   HCT 26.2 (L) 36.0 - 46.0 %   MCV 76.6 (L) 78.0 - 100.0 fL   MCH 23.4 (L) 26.0 - 34.0 pg   MCHC 30.5 30.0 - 36.0 g/dL   RDW 16.5 (H) 11.5 - 15.5 %   Platelets 251 150 - 400 K/uL    Imaging / Studies: No results found.  Medications / Allergies: per chart  Antibiotics: Anti-infectives    Start     Dose/Rate Route Frequency Ordered Stop   03/06/15 0600  cefoTEtan (CEFOTAN) 2 g in dextrose 5 % 50 mL IVPB     2 g 100 mL/hr over 30 Minutes Intravenous On call to O.R. 03/03/15 1205 03/07/15 0559   03/06/15 0600  clindamycin (CLEOCIN) 900 mg, gentamicin (GARAMYCIN) 240 mg in sodium chloride 0.9 % 1,000 mL for intraperitoneal lavage    Comments:  Pharmacy may adjust dosing strength, schedule, rate of infusion, etc as needed to optimize therapy    Intraperitoneal To Surgery 03/03/15 1205 03/07/15 0600   03/05/15 1300  neomycin (MYCIFRADIN) tablet  1,000 mg     1,000 mg Oral 3 times per day on Wed 03/03/15 1205 03/05/15 2239   03/05/15 1300  metroNIDAZOLE (FLAGYL) tablet 1,000 mg    Comments:  Take 2 pills (=1000mg ) by mouth at 1pm, 3pm, and 10pm the day before your colorectal operation   1,000 mg Oral 3 times per day on Wed 03/03/15 1205 03/05/15 2239        Note: Portions of this report may have been transcribed using voice recognition software. Every effort was made to ensure accuracy; however, inadvertent computerized transcription errors may be present.   Any transcriptional errors that result from this process are unintentional.     Adin Hector, M.D., F.A.C.S. Gastrointestinal and Minimally Invasive Surgery [facility] [address], [place], [place] [address] [phone] Main / Paging   03/06/2015  CARE  TEAM:  PCP: [facility]  Outpatient Care Team: Patient Care Team: [facility] as PCP - General (Internal Medicine) Truitt Merle, MD as Consulting Physician (Hematology) [name], MD as Consulting Physician (General Surgery) [name], MD as Consulting Physician (Gastroenterology)  Inpatient Treatment Team: Treatment Team: Attending Provider: Nolon Nations, MD; Consulting Physician: [name], MD; Consulting Physician: Truitt Merle, MD; Registered Nurse: Charlena Cross, RN; Registered Nurse: Yvette Rack, RN; Registered Nurse: Vilma Meckel, RN; Technician: Janese Banks, NT

## 2015-03-06 NOTE — Anesthesia Procedure Notes (Signed)
Procedure Name: Intubation Date/Time: 03/06/2015 9:35 AM Performed by: Glory Buff Pre-anesthesia Checklist: Patient identified, Emergency Drugs available, Suction available and Patient being monitored Patient Re-evaluated:Patient Re-evaluated prior to inductionOxygen Delivery Method: Circle System Utilized Preoxygenation: Pre-oxygenation with 100% oxygen Intubation Type: IV induction Ventilation: Mask ventilation without difficulty Laryngoscope Size: Miller and 3 Grade View: Grade II Tube type: Oral Tube size: 7.5 mm Number of attempts: 1 Airway Equipment and Method: Stylet and Oral airway Placement Confirmation: ETT inserted through vocal cords under direct vision,  positive ETCO2 and breath sounds checked- equal and bilateral Secured at: 20 cm Tube secured with: Tape Dental Injury: Teeth and Oropharynx as per pre-operative assessment

## 2015-03-06 NOTE — Discharge Instructions (Signed)
WOUND CARE  It is important that the wound be kept open.   -Keeping the skin edges apart will allow the wound to gradually heal from the base upwards.   - If the skin edges of the wound close too early, a new fluid pocket can form and infection can occur. -This is the reason to pack deeper wounds with gauze or ribbon -This is why drained wounds cannot be sewed closed right away  A healthy wound should form a lining of bright red "beefy" granulating tissue that will help shrink the wound and help the edges grow new skin into it.   -A little mucus / yellow discharge is normal (the body's natural way to try and form a scab) and should be gently washed off with soap and water with daily dressing changes.  -Green or foul smelling drainage implies bacterial colonization and can slow wound healing - a short course of antibiotic ointment (3-5 days) can help it clear up.  Call the doctor if it does not improve or worsens  -Avoid use of antibiotic ointments for more than a week as they can slow wound healing over time.    -Sometimes other wound care products will be used to reduce need for dressing changes and/or help clean up dirty wounds -Sometimes the surgeon needs to debride the wound in the office to remove dead or infected tissue out of the wound so it can heal more quickly and safely.    Change the dressing at least once a day -Wash the wound with mild soap and water gently every day.  It is good to shower or bathe the wound to help it clean out. -Use clean 4x4 gauze for medium/large wounds or ribbon plain NU-gauze for smaller wounds (it does not need to be sterile, just clean) -Keep the raw wound moist with a little saline or KY (saline) gel on the gauze.  -A dry wound will take longer to heal.  -Keep the skin dry around the wound to prevent breakdown and irritation. -Pack the wound down to the base -The goal is to keep the skin apart, not overpack the wound -Use a Q-tip or blunt-tipped kabob  stick toothpick to push the gauze down to the base in narrow or deep wounds   -Cover with a clean gauze and tape -paper or Medipore tape tend to be gentle on the skin -rotate the orientation of the tape to avoid repeated stress/trauma on the skin -using an ACE or Coban wrap on wounds on arms or legs can be used instead.  Complete all antibiotics through the entire prescription to help the infection heal and prevent new places of infection   Returning the see the surgeon is helpful to follow the healing process and help the wound close as fast as possible.  SURGERY: POST OP INSTRUCTIONS (Surgery for small bowel obstruction, colon resection, etc)   1. DIET: Follow a light bland diet the first 24 hours after arrival home, such as soup, liquids, crackers, etc.  Be sure to include lots of fluids daily.  Avoid fast food or heavy meals as your are more likely to get nauseated.  Stay on a low fat diet the next few days after surgery.  Gradually add a fiber supplement to your diet over the next week.   Your should try to eat a low-fat, high fiber diet the rest of your life thereafter (See Below).   2. Take your usually prescribed home medications unless otherwise directed.  OK to take  aspirin.    If you are on strong blood thinners (warfarin/Coumadin, Plavix, Xerelto, Eliquis, etc), discuss with your surgeon, medicine PCP, and/or cardiologist for instructions on when to restart the blood thinner & if blood monitoring is needed (PT/INR blood check, etc).  Usually you can restart any strong blood thinners after the second postoperative day.  3. PAIN CONTROL:  Pain after surgery or related to activity is often due to strain/injury to muscle, tendon, nerves and/or incisions.  This pain is usually short-term and will improve in a few months.   Many people find it helpful to do the following things TOGETHER to help speed the process of healing and to get back to regular activity more quickly:  1. Avoid heavy  physical activity at first a. No lifting greater than 20 pounds at first, then increase to lifting as tolerated over the next few weeks b. Do not push through the pain.  Listen to your body and avoid positions and maneuvers than reproduce the pain.  Wait a few days before trying something more intense c. Walking is okay as tolerated, but go slowly and stop when getting sore.  If you can walk 30 minutes without stopping or pain, you can try more intense activity (running, jogging, aerobics, cycling, swimming, treadmill, sex, sports, weightlifting, etc ) d. Remember: If it hurts to do it, then dont do it!  2. Take Anti-inflammatory medication a. Choose ONE of the following over-the-counter medications: i.            Acetaminophen 500mg  tabs (Tylenol) 1-2 pills with every meal and just before bedtime (avoid if you have liver problems) ii.            Naproxen 220mg  tabs (ex. Aleve) 1-2 pills twice a day (avoid if you have kidney, stomach, IBD, or bleeding problems) iii. Ibuprofen 200mg  tabs (ex. Advil, Motrin) 3-4 pills with every meal and just before bedtime (avoid if you have kidney, stomach, IBD, or bleeding problems) b. Take with food/snack around the clock for 1-2 weeks i. This helps the muscle and nerve tissues become less irritable and calm down faster  3. Use a Heating pad or Ice/Cold Pack a. Most patients will experience some swelling and bruising around the incisions.  Swelling and bruising can take several weeks to resolve. i. Ice packs or heating pads (30-60 minutes up to 6 times a day) will help. ii. Use ice for the first few days to help decrease swelling and bruising iii. Switch to heat to help relax tight/sore spots and speed recovery.  iv. Some people prefer to use ice alone, heat alone, alternating between ice & heat.  Experiment to what works for you a. May use warm bath/hottub  or showers  4. Try Gentle Massage and/or Stretching  a. at the area of pain many times a  day b. stop if you feel pain - do not overdo it 5. Prescription for pain medication (such as oxycodone, hydrocodone, etc) should be given to you upon discharge.  Take your pain medication as prescribed. a. If you are having problems/concerns with the prescription medicine (does not control pain, nausea, vomiting, rash, itching, etc), please call us [phone] to see if we need to switch you to a different pain medicine that will work better for you and/or control your side effect better. b. If you need a refill on your pain medication, please contact your pharmacy.  They will contact our office to request authorization. Prescriptions will not be filled after 5  pm or on week-ends. c.  Try these steps together to help you body heal faster and avoid making things get worse.  Doing just one of these things may not be enough.    If you are not getting better after two weeks or are noticing you are getting worse, contact our office for further advice; we may need to re-evaluate you & see what other things we can do to help.   GETTING TO GOOD BOWEL HEALTH. Irregular bowel habits such as constipation and diarrhea can lead to many problems over time.  Having one soft bowel movement a day is the most important way to prevent further problems.  The anorectal canal is designed to handle stretching and feces to safely manage our ability to get rid of solid waste (feces, poop, stool) out of our body.  BUT, hard constipated stools can act like ripping concrete bricks and diarrhea can be a burning fire to this very sensitive area of our body, causing inflamed hemorrhoids, anal fissures, increasing risk is perirectal abscesses, abdominal pain/bloating, an making irritable bowel worse.      The goal: ONE SOFT BOWEL MOVEMENT A DAY!  To have soft, regular bowel movements:   Drink plenty of fluids, consider 4-6 tall glasses of water a day.    Take plenty of fiber.  Fiber is the undigested part of plant food that  passes into the colon, acting s natures broom to encourage bowel motility and movement.  Fiber can absorb and hold large amounts of water. This results in a larger, bulkier stool, which is soft and easier to pass. Work gradually over several weeks up to 6 servings a day of fiber (25g a day even more if needed) in the form of: o Vegetables -- Root (potatoes, carrots, turnips), leafy green (lettuce, salad greens, celery, spinach), or cooked high residue (cabbage, broccoli, etc) o Fruit -- Fresh (unpeeled skin & pulp), Dried (prunes, apricots, cherries, etc ),  or stewed ( applesauce)  o Whole grain breads, pasta, etc (whole wheat)  o Bran cereals   Bulking Agents -- This type of water-retaining fiber generally is easily obtained each day by one of the following:  o Psyllium bran -- The psyllium plant is remarkable because its ground seeds can retain so much water. This product is available as Metamucil, Konsyl, Effersyllium, Per Diem Fiber, or the less expensive generic preparation in drug and health food stores. Although labeled a laxative, it really is not a laxative.  o Methylcellulose -- This is another fiber derived from wood which also retains water. It is available as Citrucel. o Polyethylene Glycol - and artificial fiber commonly called Miralax or Glycolax.  It is helpful for people with gassy or bloated feelings with regular fiber o Flax Seed - a less gassy fiber than psyllium  No reading or other relaxing activity while on the toilet. If bowel movements take longer than 5 minutes, you are too constipated  AVOID CONSTIPATION.  High fiber and water intake usually takes care of this.  Sometimes a laxative is needed to stimulate more frequent bowel movements, but   Laxatives are not a good long-term solution as it can wear the colon out.  They can help jump-start bowels if constipated, but should be relied on constantly without discussing with your doctor o Osmotics (Milk of Magnesia, Fleets  phosphosoda, Magnesium citrate, MiraLax, GoLytely) are safer than  o Stimulants (Senokot, Castor Oil, Dulcolax, Ex Lax)    o Avoid taking laxatives for more than  7 days in a row.   IF SEVERELY CONSTIPATED, try a Bowel Retraining Program: o Do not use laxatives.  o Eat a diet high in roughage, such as bran cereals and leafy vegetables.  o Drink six (6) ounces of prune or apricot juice each morning.  o Eat two (2) large servings of stewed fruit each day.  o Take one (1) heaping tablespoon of a psyllium-based bulking agent twice a day. Use sugar-free sweetener when possible to avoid excessive calories.  o Eat a normal breakfast.  o Set aside 15 minutes after breakfast to sit on the toilet, but do not strain to have a bowel movement.  o If you do not have a bowel movement by the third day, use an enema and repeat the above steps.   Controlling diarrhea o Switch to liquids and simpler foods for a few days to avoid stressing your intestines further. o Avoid dairy products (especially milk & ice cream) for a short time.  The intestines often can lose the ability to digest lactose when stressed. o Avoid foods that cause gassiness or bloating.  Typical foods include beans and other legumes, cabbage, broccoli, and dairy foods.  Every person has some sensitivity to other foods, so listen to our body and avoid those foods that trigger problems for you. o Adding fiber (Citrucel, Metamucil, psyllium, Miralax) gradually can help thicken stools by absorbing excess fluid and retrain the intestines to act more normally.  Slowly increase the dose over a few weeks.  Too much fiber too soon can backfire and cause cramping & bloating. o Probiotics (such as active yogurt, Align, etc) may help repopulate the intestines and colon with normal bacteria and calm down a sensitive digestive tract.  Most studies show it to be of mild help, though, and such products can be costly. o Medicines: - Bismuth subsalicylate (ex.  Kayopectate, Pepto Bismol) every 30 minutes for up to 6 doses can help control diarrhea.  Avoid if pregnant. - Loperamide (Immodium) can slow down diarrhea.  Start with two tablets (4mg  total) first and then try one tablet every 6 hours.  Avoid if you are having fevers or severe pain.  If you are not better or start feeling worse, stop all medicines and call your doctor for advice o Call your doctor if you are getting worse or not better.  Sometimes further testing (cultures, endoscopy, X-ray studies, bloodwork, etc) may be needed to help diagnose and treat the cause of the diarrhea.  TROUBLESHOOTING IRREGULAR BOWELS 1) Avoid extremes of bowel movements (no bad constipation/diarrhea) 2) Miralax 17gm mixed in 8oz. water or juice-daily. May use BID as needed.  3) Gas-x,Phazyme, etc. as needed for gas & bloating.  4) Soft,bland diet. No spicy,greasy,fried foods.  5) Prilosec over-the-counter as needed  6) May hold gluten/wheat products from diet to see if symptoms improve.  7)  May try probiotics (Align, Activa, etc) to help calm the bowels down 7) If symptoms become worse call back immediately.   4. Wash / shower every day.  You may shower over the incision / wound.  Avoid baths until 5 days after surgery.  Continue to shower over incision(s) after the dressing is off.  5. Remove your waterproof bandages 5 days after surgery.  You may leave the incision open to air.  Remove any wicks or ribbons in your wound.  If you have an open wound, please see wound care instructions. You may replace a dressing/Band-Aid to cover the incision for comfort if  you wish.  6. ACTIVITIES as tolerated:   a. You may resume regular (light) daily activities beginning the next day--such as daily self-care, walking, climbing stairs--gradually increasing activities as tolerated.  If you can walk 30 minutes without difficulty, it is safe to try more intense activity such as jogging, treadmill, bicycling, low-impact aerobics,  swimming, etc. b. Save the most intensive and strenuous activity for last (Usually 3-6 weeks after surgery) such as sit-ups, heavy lifting, contact sports, etc  Refrain from any heavy lifting or straining until you are off narcotics for pain control.   c. DO NOT PUSH THROUGH PAIN.  Let pain be your guide: If it hurts to do something, don't do it.  Pain is your body warning you to avoid that activity for another week until the pain goes down. d. You may drive when you are no longer taking prescription pain medication, you can comfortably wear a seatbelt, and you can safely maneuver your car and apply brakes. e. Dennis Bast may have sexual intercourse when it is comfortable. If it hurts to do something, don't do it.  7. FOLLOW UP in our office a. Please call [facility] at [phone] to set up an appointment to see your surgeon in the office for a follow-up appointment approximately 2-3 weeks after your surgery. b. Make sure that you call for this appointment the day you arrive home to insure a convenient appointment time.  8. IF YOU HAVE DISABILITY OR FAMILY LEAVE FORMS, BRING THEM TO THE OFFICE FOR PROCESSING.  DO NOT GIVE THEM TO YOUR DOCTOR.   WHEN TO CALL us [phone]: 1. Poor pain control 2. Reactions / problems with new medications (rash/itching, nausea, etc)  3. Fever over 101.5 F (38.5 C) 4. Inability to urinate 5. Nausea and/or vomiting 6. Worsening swelling or bruising 7. Continued bleeding from incision. 8. Increased pain, redness, or drainage from the incision  The clinic staff is available to answer your questions during regular business hours (8:30am-5pm).  Please dont hesitate to call and ask to speak to one of our nurses for clinical concerns.   A surgeon from [facility] is always on call at the hospitals   If you have a medical emergency, go to the nearest emergency room or call 911.    [facility], [place], [place], [place],    [id number] ? MAIN: [phone] ? TOLL FREE: [phone] ? FAX [phone] [email or web address]  Managing Pain  Pain after surgery or related to activity is often due to strain/injury to muscle, tendon, nerves and/or incisions.  This pain is usually short-term and will improve in a few months.   Many people find it helpful to do the following things TOGETHER to help speed the process of healing and to get back to regular activity more quickly:  6. Avoid heavy physical activity at first a. No lifting greater than 20 pounds at first, then increase to lifting as tolerated over the next few weeks b. Do not push through the pain.  Listen to your body and avoid positions and maneuvers than reproduce the pain.  Wait a few days before trying something more intense c. Walking is okay as tolerated, but go slowly and stop when getting sore.  If you can walk 30 minutes without stopping or pain, you can try more intense activity (running, jogging, aerobics, cycling, swimming, treadmill, sex, sports, weightlifting, etc ) d. Remember: If it hurts to do it, then dont do it!  7.  Take Anti-inflammatory medication a. Choose ONE of the following over-the-counter medications: i.            Acetaminophen 500mg  tabs (Tylenol) 1-2 pills with every meal and just before bedtime (avoid if you have liver problems) ii.            Naproxen 220mg  tabs (ex. Aleve) 1-2 pills twice a day (avoid if you have kidney, stomach, IBD, or bleeding problems) iii. Ibuprofen 200mg  tabs (ex. Advil, Motrin) 3-4 pills with every meal and just before bedtime (avoid if you have kidney, stomach, IBD, or bleeding problems) b. Take with food/snack around the clock for 1-2 weeks i. This helps the muscle and nerve tissues become less irritable and calm down faster  8. Use a Heating pad or Ice/Cold Pack a. 4-6 times a day b. May use warm bath/hottub  or showers  9. Try Gentle Massage and/or Stretching  a. at the area of  pain many times a day b. stop if you feel pain - do not overdo it  Try these steps together to help you body heal faster and avoid making things get worse.  Doing just one of these things may not be enough.    If you are not getting better after two weeks or are noticing you are getting worse, contact our office for further advice; we may need to re-evaluate you & see what other things we can do to help.   GETTING TO GOOD BOWEL HEALTH. Irregular bowel habits such as constipation and diarrhea can lead to many problems over time.  Having one soft bowel movement a day is the most important way to prevent further problems.  The anorectal canal is designed to handle stretching and feces to safely manage our ability to get rid of solid waste (feces, poop, stool) out of our body.  BUT, hard constipated stools can act like ripping concrete bricks and diarrhea can be a burning fire to this very sensitive area of our body, causing inflamed hemorrhoids, anal fissures, increasing risk is perirectal abscesses, abdominal pain/bloating, an making irritable bowel worse.      The goal: ONE SOFT BOWEL MOVEMENT A DAY!  To have soft, regular bowel movements:   Drink plenty of fluids, consider 4-6 tall glasses of water a day.    Take plenty of fiber.  Fiber is the undigested part of plant food that passes into the colon, acting s natures broom to encourage bowel motility and movement.  Fiber can absorb and hold large amounts of water. This results in a larger, bulkier stool, which is soft and easier to pass. Work gradually over several weeks up to 6 servings a day of fiber (25g a day even more if needed) in the form of: o Vegetables -- Root (potatoes, carrots, turnips), leafy green (lettuce, salad greens, celery, spinach), or cooked high residue (cabbage, broccoli, etc) o Fruit -- Fresh (unpeeled skin & pulp), Dried (prunes, apricots, cherries, etc ),  or stewed ( applesauce)  o Whole grain breads, pasta, etc (whole  wheat)  o Bran cereals   Bulking Agents -- This type of water-retaining fiber generally is easily obtained each day by one of the following:  o Psyllium bran -- The psyllium plant is remarkable because its ground seeds can retain so much water. This product is available as Metamucil, Konsyl, Effersyllium, Per Diem Fiber, or the less expensive generic preparation in drug and health food stores. Although labeled a laxative, it really is not a laxative.  o Methylcellulose --  This is another fiber derived from wood which also retains water. It is available as Citrucel. o Polyethylene Glycol - and artificial fiber commonly called Miralax or Glycolax.  It is helpful for people with gassy or bloated feelings with regular fiber o Flax Seed - a less gassy fiber than psyllium  No reading or other relaxing activity while on the toilet. If bowel movements take longer than 5 minutes, you are too constipated  AVOID CONSTIPATION.  High fiber and water intake usually takes care of this.  Sometimes a laxative is needed to stimulate more frequent bowel movements, but   Laxatives are not a good long-term solution as it can wear the colon out.  They can help jump-start bowels if constipated, but should be relied on constantly without discussing with your doctor o Osmotics (Milk of Magnesia, Fleets phosphosoda, Magnesium citrate, MiraLax, GoLytely) are safer than  o Stimulants (Senokot, Castor Oil, Dulcolax, Ex Lax)    o Avoid taking laxatives for more than 7 days in a row.   IF SEVERELY CONSTIPATED, try a Bowel Retraining Program: o Do not use laxatives.  o Eat a diet high in roughage, such as bran cereals and leafy vegetables.  o Drink six (6) ounces of prune or apricot juice each morning.  o Eat two (2) large servings of stewed fruit each day.  o Take one (1) heaping tablespoon of a psyllium-based bulking agent twice a day. Use sugar-free sweetener when possible to avoid excessive calories.  o Eat a normal  breakfast.  o Set aside 15 minutes after breakfast to sit on the toilet, but do not strain to have a bowel movement.  o If you do not have a bowel movement by the third day, use an enema and repeat the above steps.   Controlling diarrhea o Switch to liquids and simpler foods for a few days to avoid stressing your intestines further. o Avoid dairy products (especially milk & ice cream) for a short time.  The intestines often can lose the ability to digest lactose when stressed. o Avoid foods that cause gassiness or bloating.  Typical foods include beans and other legumes, cabbage, broccoli, and dairy foods.  Every person has some sensitivity to other foods, so listen to our body and avoid those foods that trigger problems for you. o Adding fiber (Citrucel, Metamucil, psyllium, Miralax) gradually can help thicken stools by absorbing excess fluid and retrain the intestines to act more normally.  Slowly increase the dose over a few weeks.  Too much fiber too soon can backfire and cause cramping & bloating. o Probiotics (such as active yogurt, Align, etc) may help repopulate the intestines and colon with normal bacteria and calm down a sensitive digestive tract.  Most studies show it to be of mild help, though, and such products can be costly. o Medicines: - Bismuth subsalicylate (ex. Kayopectate, Pepto Bismol) every 30 minutes for up to 6 doses can help control diarrhea.  Avoid if pregnant. - Loperamide (Immodium) can slow down diarrhea.  Start with two tablets (4mg  total) first and then try one tablet every 6 hours.  Avoid if you are having fevers or severe pain.  If you are not better or start feeling worse, stop all medicines and call your doctor for advice o Call your doctor if you are getting worse or not better.  Sometimes further testing (cultures, endoscopy, X-ray studies, bloodwork, etc) may be needed to help diagnose and treat the cause of the diarrhea.  TROUBLESHOOTING IRREGULAR BOWELS  1)  Avoid extremes of bowel movements (no bad constipation/diarrhea) 2) Miralax 17gm mixed in 8oz. water or juice-daily. May use BID as needed.  3) Gas-x,Phazyme, etc. as needed for gas & bloating.  4) Soft,bland diet. No spicy,greasy,fried foods.  5) Prilosec over-the-counter as needed  6) May hold gluten/wheat products from diet to see if symptoms improve.  7)  May try probiotics (Align, Activa, etc) to help calm the bowels down 7) If symptoms become worse call back immediately.  Colorectal Cancer Colorectal cancer is an abnormal growth of tissue (tumor) in the colon or rectum that is cancerous (malignant). Unlike noncancerous (benign) tumors, malignant tumors can spread to other parts of your body. The colon is the large bowel or large intestine. The rectum is the last several inches of the colon.  RISK FACTORS The exact cause of colorectal cancer is unknown. However, the following factors may increase your chances of getting colorectal cancer:   Age older than 37 years.   Abnormal growths (polyps) on the inner wall of the colon or rectum.   Diabetes.   African American race.   Family history of hereditary nonpolyposis colorectal cancer. This condition is caused by changes in the genes that are responsible for repairing mismatched DNA.   Personal history of cancer. A person who has already had colorectal cancer may develop it a second time. Also, women with a history of ovarian, uterine, or breast cancer are at a somewhat higher risk of developing colorectal cancer.  Certain hereditary conditions.  Eating a diet that is high in fat (especially animal fat) and low in fiber, fruits, and vegetables.  Sedentary lifestyle.  Inflammatory bowel disease, including ulcerative colitis and Crohn's disease.   Smoking.   Excessive alcohol use.  SYMPTOMS Early colorectal cancer often does not cause symptoms. As the cancer grows, symptoms may include:   Changes in bowel  habits.  Diarrhea.   Constipation.   Feeling like the bowel does not empty completely after a bowel movement.   Blood in the stool.   Stools that are narrower than usual.   Abdominal discomfort, pain, bloating, fullness, or cramps.  Frequent gas pain.   Unexplained weight loss.   Constant tiredness.   Nausea and vomiting.  DIAGNOSIS  Your health care provider will ask about your medical history. He or she may also perform a number of procedures, such as:   A physical exam.  A digital rectal exam.  A fecal occult blood test.  A barium enema.  Blood tests.   X-rays.   Imaging tests, such as CT scans or MRIs.   Taking a tissue sample (biopsy) from your colon or rectum to look for cancer cells.   A sigmoidoscopy to view the inside of the last part of your colon.   A colonoscopy to view the inside of your entire colon.   An endorectal ultrasound to see how deep a rectal tumor has grown and whether the cancer has spread to lymph nodes or other nearby tissues.  Your cancer will be staged to determine its severity and extent. Staging is a careful attempt to find out the size of the tumor, whether the cancer has spread, and if so, to what parts of the body. You may need to have more tests to determine the stage of your cancer. The test results will help determine what treatment plan is best for you.   Stage 0. The cancer is found only in the innermost lining of the colon or rectum.  Stage I. The cancer has grown into the inner wall of the colon or rectum. The cancer has not yet reached the outer wall of the colon.   Stage II. The cancer extends more deeply into or through the wall of the colon or rectum. It may have invaded nearby tissue, but cancer cells have not spread to the lymph nodes.   Stage III. The cancer has spread to nearby lymph nodes but not to other parts of the body.   Stage IV. The cancer has spread to other parts of the body, such  as the liver or lungs.  Your health care provider may tell you the detailed stage of your cancer, which includes both a number and a letter.  TREATMENT  Depending on the type and stage, colorectal cancer may be treated with surgery, radiation therapy, chemotherapy, targeted therapy, or radiofrequency ablation. Some people have a combination of these therapies. Surgery may be done to remove the polyps from your colon. In early stages, your health care provider may be able to do this during a colonoscopy. In later stages, surgery may be done to remove part of your colon.  HOME CARE INSTRUCTIONS   Take medicines only as directed by your health care provider.   Maintain a healthy diet.   Consider joining a support group. This may help you learn to cope with the stress of having colorectal cancer.   Seek advice to help you manage treatment of side effects.   Keep all follow-up visits as directed by your health care provider.   Inform your cancer specialist if you are admitted to the hospital.  SEEK MEDICAL CARE IF:  Your diarrhea or constipation does not go away.   Your bowel habits change.  You have increased abdominal pain.   You notice new fatigue or weakness.  You lose weight. Document Released: 06/21/2005 Document Revised: 11/05/2013 Document Reviewed: 12/14/2012 [facility] Patient Information 2015 [place], [place]. This information is not intended to replace advice given to you by your health care provider. Make sure you discuss any questions you have with your health care provider.

## 2015-03-06 NOTE — Transfer of Care (Signed)
Immediate Anesthesia Transfer of Care Note  Patient: [name] [name]  Procedure(s) Performed: Procedure(s): LAPAROSCOPIC takedown loop colostomy  (N/A) SPLENECTOMY (N/A) LOW ANTERIOR BOWEL RESECTION, rigid proctoscopy transverse POLYPECTOMY x 4  Patient Location: PACU  Anesthesia Type:General  Level of Consciousness: awake, alert  and oriented  Airway & Oxygen Therapy: Patient Spontanous Breathing and Patient connected to face mask oxygen  Post-op Assessment: Report given to RN and Post -op Vital signs reviewed and stable  Post vital signs: Reviewed and stable  Last Vitals:  Filed Vitals:   03/06/15 0831  BP: 131/38  Pulse: 91  Temp: 36.9 C  Resp: 16    Complications: No apparent anesthesia complications

## 2015-03-06 NOTE — Anesthesia Preprocedure Evaluation (Addendum)
Anesthesia Evaluation  Patient identified by MRN, date of birth, ID band Patient awake    Reviewed: Allergy & Precautions, NPO status , Patient's Chart, lab work & pertinent test results  Airway Mallampati: II  TM Distance: >3 FB Neck ROM: Full    Dental no notable dental hx.    Pulmonary former smoker,  H/O PE, on xarelto breath sounds clear to auscultation  Pulmonary exam normal       Cardiovascular Normal cardiovascular exam+ Valvular Problems/Murmurs Rhythm:Regular Rate:Normal  ECHO: May 2016: EF 55-60%   Neuro/Psych Anxiety negative neurological ROS     GI/Hepatic negative GI ROS, Neg liver ROS,   Endo/Other  negative endocrine ROS  Renal/GU negative Renal ROS  negative genitourinary   Musculoskeletal negative musculoskeletal ROS (+)   Abdominal   Peds negative pediatric ROS (+)  Hematology  (+) anemia , HGB 8.0   Anesthesia Other Findings   Reproductive/Obstetrics negative OB ROS                          Anesthesia Physical Anesthesia Plan  ASA: II  Anesthesia Plan: General   Post-op Pain Management:    Induction: Intravenous  Airway Management Planned: Oral ETT  Additional Equipment:   Intra-op Plan:   Post-operative Plan: Extubation in OR  Informed Consent: I have reviewed the patients History and Physical, chart, labs and discussed the procedure including the risks, benefits and alternatives for the proposed anesthesia with the patient or authorized representative who has indicated his/her understanding and acceptance.   Dental advisory given  Plan Discussed with: CRNA  Anesthesia Plan Comments: (Discussed increased possibility of blood transfusion with patient.)       Anesthesia Quick Evaluation

## 2015-03-07 ENCOUNTER — Encounter (HOSPITAL_COMMUNITY): Payer: Self-pay | Admitting: Surgery

## 2015-03-07 LAB — BASIC METABOLIC PANEL
Anion gap: 8 (ref 5–15)
BUN: 7 mg/dL (ref 6–20)
CHLORIDE: 107 mmol/L (ref 101–111)
CO2: 24 mmol/L (ref 22–32)
Calcium: 9.3 mg/dL (ref 8.9–10.3)
Creatinine, Ser: 0.75 mg/dL (ref 0.44–1.00)
GFR calc Af Amer: >60 mL/min (ref >60)
GFR calc non Af Amer: >60 mL/min (ref >60)
GLUCOSE: 117 mg/dL — AB (ref 65–99)
POTASSIUM: 3.9 mmol/L (ref 3.5–5.1)
SODIUM: 139 mmol/L (ref 135–145)

## 2015-03-07 LAB — CBC
HEMATOCRIT: 23.8 % — AB (ref 36.0–46.0)
HEMOGLOBIN: 7.4 g/dL — AB (ref 12.0–15.0)
MCH: 23.8 pg — AB (ref 26.0–34.0)
MCHC: 31.1 g/dL (ref 30.0–36.0)
MCV: 76.5 fL — AB (ref 78.0–100.0)
Platelets: 276 10*3/uL (ref 150–400)
RBC: 3.11 MIL/uL — AB (ref 3.87–5.11)
RDW: 16.9 % — ABNORMAL HIGH (ref 11.5–15.5)
WBC: 11.6 10*3/uL — ABNORMAL HIGH (ref 4.0–10.5)

## 2015-03-07 LAB — MAGNESIUM: MAGNESIUM: 1.4 mg/dL — AB (ref 1.7–2.4)

## 2015-03-07 MED ORDER — HYDROCODONE-ACETAMINOPHEN 5-325 MG PO TABS: 1.0000 | ORAL_TABLET | ORAL | Status: DC | PRN

## 2015-03-07 MED ORDER — HAEMOPHILUS B POLYSAC CONJ VAC IM SOLR
0.5000 mL | INTRAMUSCULAR | Status: AC | PRN
  Administered 2015-03-12: 0.5 mL via INTRAMUSCULAR
  Filled 2015-03-07: qty 0.5

## 2015-03-07 MED ORDER — INFLUENZA VAC SPLIT QUAD 0.5 ML IM SUSY
0.5000 mL | PREFILLED_SYRINGE | INTRAMUSCULAR | Status: AC | PRN
  Administered 2015-03-12: 0.5 mL via INTRAMUSCULAR
  Filled 2015-03-07 (×2): qty 0.5

## 2015-03-07 MED ORDER — MENINGOCOCCAL VAC A,C,Y,W-135 ~~LOC~~ INJ
0.5000 mL | INJECTION | SUBCUTANEOUS | Status: AC | PRN
  Administered 2015-03-12: 0.5 mL via SUBCUTANEOUS
  Filled 2015-03-07 (×2): qty 0.5

## 2015-03-07 MED ORDER — LACTATED RINGERS IV BOLUS (SEPSIS)
1000.0000 mL | Freq: Once | INTRAVENOUS | Status: AC
  Administered 2015-03-07: 1000 mL via INTRAVENOUS

## 2015-03-07 MED ORDER — ENOXAPARIN SODIUM 40 MG/0.4ML ~~LOC~~ SOLN
40.0000 mg | SUBCUTANEOUS | Status: DC
  Administered 2015-03-08: 40 mg via SUBCUTANEOUS
  Filled 2015-03-07: qty 0.4

## 2015-03-07 MED ORDER — PNEUMOCOCCAL 13-VAL CONJ VACC IM SUSP
0.5000 mL | INTRAMUSCULAR | Status: AC | PRN
  Administered 2015-03-12: 0.5 mL via INTRAMUSCULAR
  Filled 2015-03-07 (×2): qty 0.5

## 2015-03-07 NOTE — Progress Notes (Signed)
Patient ambulated 64 feet.  Heart rate 120-150. Slightly shob.  Overall tolerated well, other than HR.

## 2015-03-07 NOTE — Progress Notes (Signed)
1 small, brown, liquid bm via rectum at 1430.

## 2015-03-07 NOTE — Progress Notes (Signed)
Patient ID: [name] [name], female   DOB: 1950/07/07, 64 y.o.   MRN: [id number]     [address] [place], [place] [address]    Phone: [phone] FAX: [phone]     Subjective: Pt in good spirits.   No flatus. No n/v.  Tolerating liquids.  Has not been OOB yet.  Good uop. BP soft, but stable.  Tachycardia, but no sob and sats are stable.  H&h down to 7.4/23.8.    Objective:  Vital signs:  Filed Vitals:   03/07/15 0400 03/07/15 0500 03/07/15 0600 03/07/15 0700  BP: 113/54 107/59 110/56 107/51  Pulse: 111 109 115 110  Temp:      TempSrc:      Resp: _0 Height:      Weight:      SpO2: 98% 98% 97% 98%    Last BM Date: 03/05/15  Intake/Output   Yesterday:  09/01 0701 - 09/02 0700 In: 5993.8 [P.O.:480; I.V.:5163.8; IV Piggyback:350] Out: 4010 [UVOZD:6644; Blood:150] This shift: I/O last 3 completed shifts: In: 7666.5 [P.O.:720; I.V.:6596.5; IV Piggyback:350] Out: 2485 [Urine:2185; Stool:150; Blood:150]    Physical Exam: General: Pt awake/alert/oriented x4 in no acute distress Chest: cta.  No chest wall pain w good excursion CV:  Pulses intact.  Regular rhythm MS: Normal AROM mjr joints.  No obvious deformity Abdomen: Soft.  Nondistended.   Mildly tender at incisions only.  Dressings are c/d/i.  LUQ wound with VAC, serosanguinous output.  No evidence of peritonitis.  No incarcerated hernias. Ext:  SCDs BLE.  No mjr edema.  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   Nausea with vomiting   Anxiety   Upper abdominal pain   Long-term (current) use of anticoagulants   Parastomal hernia   Metastatic colon cancer to liver    Results:   Labs: Results for orders placed or performed during the hospital  encounter of 03/02/15 (from the past 48 hour(s))  Type and screen     Status: None (Preliminary result)   Collection Time: 03/05/15 12:40 PM  Result Value Ref Range   ABO/RH(D) AB POS    Antibody Screen NEG    Sample Expiration 03/08/2015    Unit Number [id number]    Blood Component Type RED CELLS,LR    Unit division 00    Status of Unit ALLOCATED    Transfusion Status OK TO TRANSFUSE    Crossmatch Result Compatible    Unit Number [id number]    Blood Component Type RED CELLS,LR    Unit division 00    Status of Unit ALLOCATED    Transfusion Status OK TO TRANSFUSE    Crossmatch Result Compatible   CBC     Status: Abnormal   Collection Time: 03/06/15  4:58 AM  Result Value Ref Range   WBC 5.1 4.0 - 10.5 K/uL   RBC 3.42 (L) 3.87 - 5.11 MIL/uL   Hemoglobin 8.0 (L) 12.0 - 15.0 g/dL   HCT 26.2 (L) 36.0 - 46.0 %   MCV 76.6 (L) 78.0 - 100.0 fL   MCH 23.4 (L) 26.0 - 34.0 pg   MCHC 30.5 30.0 - 36.0 g/dL   RDW 16.5 (H) 11.5 - 15.5 %   Platelets 251 150 -  400 K/uL  Prepare RBC     Status: None   Collection Time: 03/06/15  9:15 AM  Result Value Ref Range   Order Confirmation ORDER PROCESSED BY BLOOD BANK   MRSA PCR Screening     Status: None   Collection Time: 03/06/15  1:51 PM  Result Value Ref Range   MRSA by PCR NEGATIVE NEGATIVE    Comment:        The GeneXpert MRSA Assay (FDA approved for NASAL specimens only), is one component of a comprehensive MRSA colonization surveillance program. It is not intended to diagnose MRSA infection nor to guide or monitor treatment for MRSA infections.   CBC     Status: Abnormal   Collection Time: 03/07/15  4:43 AM  Result Value Ref Range   WBC 11.6 (H) 4.0 - 10.5 K/uL   RBC 3.11 (L) 3.87 - 5.11 MIL/uL   Hemoglobin 7.4 (L) 12.0 - 15.0 g/dL   HCT 23.8 (L) 36.0 - 46.0 %   MCV 76.5 (L) 78.0 - 100.0 fL   MCH 23.8 (L) 26.0 - 34.0 pg   MCHC 31.1 30.0 - 36.0 g/dL   RDW 16.9 (H) 11.5 - 15.5 %   Platelets 276 150 - 400 K/uL  Basic  metabolic panel     Status: Abnormal   Collection Time: 03/07/15  4:43 AM  Result Value Ref Range   Sodium 139 135 - 145 mmol/L   Potassium 3.9 3.5 - 5.1 mmol/L   Chloride 107 101 - 111 mmol/L   CO2 24 22 - 32 mmol/L   Glucose, Bld 117 (H) 65 - 99 mg/dL   BUN 7 6 - 20 mg/dL   Creatinine, Ser 0.75 0.44 - 1.00 mg/dL   Calcium 9.3 8.9 - 10.3 mg/dL   GFR calc non Af Amer >60 >60 mL/min   GFR calc Af Amer >60 >60 mL/min    Comment: (NOTE) The eGFR has been calculated using the CKD EPI equation. This calculation has not been validated in all clinical situations. eGFR's persistently <60 mL/min signify possible Chronic Kidney Disease.    Anion gap 8 5 - 15  Magnesium     Status: Abnormal   Collection Time: 03/07/15  4:43 AM  Result Value Ref Range   Magnesium 1.4 (L) 1.7 - 2.4 mg/dL    Imaging / Studies: No results found.  Medications / Allergies:  Scheduled Meds: . sodium chloride   Intravenous Once  . acetaminophen  1,000 mg Oral TID  . alvimopan  12 mg Oral BID  . antiseptic oral rinse  7 mL Mouth Rinse BID  . chlorhexidine  60 mL Topical Once  . enoxaparin (LOVENOX) injection  40 mg Subcutaneous Q24H  . feeding supplement  1 Container Oral TID BM  . lactated ringers  1,000 mL Intravenous Once  . lip balm  1 application Topical BID  . methocarbamol  500 mg Oral QID  . polyethylene glycol  34 g Oral BID  . saccharomyces boulardii  250 mg Oral BID   Continuous Infusions: . sodium chloride 75 mL/hr at 03/07/15 0233  . dextrose 5% lactated ringers 100 mL/hr at 03/06/15 0422   PRN Meds:.alum & mag hydroxide-simeth, diphenhydrAMINE **OR** diphenhydrAMINE, HYDROmorphone (DILAUDID) injection, lactated ringers, lidocaine-prilocaine, magic mouthwash, menthol-cetylpyridinium, metoprolol, morphine injection, ondansetron **OR** ondansetron (ZOFRAN) IV, phenol, prochlorperazine, promethazine  Antibiotics: Anti-infectives    Start     Dose/Rate Route Frequency Ordered Stop   03/06/15  2200  cefoTEtan (CEFOTAN) 2 g in dextrose 5 %  50 mL IVPB     2 g 100 mL/hr over 30 Minutes Intravenous Every 12 hours 03/06/15 1357 03/06/15 2313   03/06/15 1030  clindamycin (CLEOCIN) 900 mg, gentamicin (GARAMYCIN) 240 mg in sodium chloride 0.9 % 1,000 mL for intraperitoneal lavage  Status:  Discontinued      Intraperitoneal To Surgery 03/06/15 1017 03/06/15 1021   03/06/15 1030  clindamycin (CLEOCIN) 900 mg, gentamicin (GARAMYCIN) 240 mg in sodium chloride 0.9 % 1,000 mL for intraperitoneal lavage      Intraperitoneal To Surgery 03/06/15 1022 03/06/15 1406   03/06/15 1030  clindamycin (CLEOCIN) 900 mg, gentamicin (GARAMYCIN) 240 mg in sodium chloride 0.9 % 1,000 mL for intraperitoneal lavage  Status:  Discontinued      Intraperitoneal To Surgery 03/06/15 1024 03/06/15 1027   03/06/15 0600  cefoTEtan (CEFOTAN) 2 g in dextrose 5 % 50 mL IVPB     2 g 100 mL/hr over 30 Minutes Intravenous On call to O.R. 03/03/15 1205 03/06/15 0937   03/06/15 0600  clindamycin (CLEOCIN) 900 mg, gentamicin (GARAMYCIN) 240 mg in sodium chloride 0.9 % 1,000 mL for intraperitoneal lavage  Status:  Discontinued    Comments:  Pharmacy may adjust dosing strength, schedule, rate of infusion, etc as needed to optimize therapy    Intraperitoneal To Surgery 03/03/15 1205 03/06/15 1021   03/05/15 1300  neomycin (MYCIFRADIN) tablet 1,000 mg     1,000 mg Oral 3 times per day on Wed 03/03/15 1205 03/05/15 2239   03/05/15 1300  metroNIDAZOLE (FLAGYL) tablet 1,000 mg    Comments:  Take 2 pills (=1017m) by mouth at 1pm, 3pm, and 10pm the day before your colorectal operation   1,000 mg Oral 3 times per day on Wed 03/03/15 1205 03/05/15 2239        Assessment/Plan Obstructing colon cancer with recurrent colostomy prolapse Metastatic colon cancer POD#1 laparoscopic LAR, splenectomy, takedown of loop colostomy, vaginal cuff peritoneal biopsy, colon polypectomyx4---Dr. GJohney [place]-leave on fulls until bowel function returns -DC  foley -ambulate, up to chair, needs IS  -leave dressing ABL anemia-hgb 7.4 today.  No indications for transfusion at this time.  Repeat CBC in AM.  Transfuse <7 and/or if symptomatic.  Pulmonary embolism-hold off on resuming heparin gtt given tachycardia and drop in hgb.   FEN-give 1L LR bolus, IVF, add PO pain meds Dispo-SDU for tachycardia   EErby Pian ANP-BC [facility] Pager [phone](7A-4:30P)   03/07/2015 7:33 AM

## 2015-03-07 NOTE — Progress Notes (Signed)
Date:  Sept.2, 2016 U.R. performed for needs and level of care. POD 1 continues to be hypotensive and tachycardic/ no bowel sounds or flatus at this point. Will continue to follow for Case Management needs.  Velva Harman, RN, BSN, [place]   [phone]

## 2015-03-08 LAB — CBC
HCT: 21.5 % — ABNORMAL LOW (ref 36.0–46.0)
HEMOGLOBIN: 6.7 g/dL — AB (ref 12.0–15.0)
MCH: 23.8 pg — AB (ref 26.0–34.0)
MCHC: 31.2 g/dL (ref 30.0–36.0)
MCV: 76.2 fL — ABNORMAL LOW (ref 78.0–100.0)
PLATELETS: 264 10*3/uL (ref 150–400)
RBC: 2.82 MIL/uL — ABNORMAL LOW (ref 3.87–5.11)
RDW: 17 % — AB (ref 11.5–15.5)
WBC: 13.1 10*3/uL — ABNORMAL HIGH (ref 4.0–10.5)

## 2015-03-08 LAB — POTASSIUM: Potassium: 3.6 mmol/L (ref 3.5–5.1)

## 2015-03-08 LAB — CREATININE, SERUM
CREATININE: 0.59 mg/dL (ref 0.44–1.00)
GFR calc Af Amer: >60 mL/min (ref >60)

## 2015-03-08 MED ORDER — SODIUM POLYSTYRENE SULFONATE 15 GM/60ML PO SUSP: 30.0000 g | ORAL | Status: DC

## 2015-03-08 MED ORDER — SODIUM CHLORIDE 0.9 % IV BOLUS (SEPSIS)
500.0000 mL | Freq: Once | INTRAVENOUS | Status: AC
  Administered 2015-03-08: 500 mL via INTRAVENOUS

## 2015-03-08 MED ORDER — SODIUM CHLORIDE 0.9 % IV SOLN: Freq: Once | INTRAVENOUS | Status: DC

## 2015-03-08 NOTE — Progress Notes (Signed)
CRITICAL VALUE ALERT  Critical value received:  Hgb 6.7  Date of notification:  03/08/2015   Time of notification:  5:27 AM  Critical value read back:Yes.    Nurse who received alert:  Lorretta Harp  MD notified (1st page):  Dr Saddie Benders  Time of first page:  5:28 AM  MD notified (2nd page):  Time of second page:  Responding MD:  5:28 AM   Time MD responded:

## 2015-03-08 NOTE — Progress Notes (Signed)
Pt alert x4, heart rate elevated in the 130's,  Md Hoxworth made aware, order for 500 cc of NS bolus. Order for 2 units of blood. Will continue to monitor. Jeanie Sewer, RN 5:35 AM 03/08/2015

## 2015-03-08 NOTE — Progress Notes (Signed)
Patient ID: [name] [name], female   DOB: January 11, 1951, 64 y.o.   MRN: [id number]  [facility]  POD#: 2  Subjective: Patient awake and alert.  Receiving 2U PRBC's now.  States having BM's.  Tolerating liquid diet.  OOB to chair yesterday.  Objective: Vital signs in last 24 hours: Temp:  [98 F (36.7 C)-98.9 F (37.2 C)] 98.4 F (36.9 C) (09/03 0900) Pulse Rate:  [105-144] 118 (09/03 0900) Resp:  [14-24] 17 (09/03 0900) BP: (87-145)/(36-70) 127/70 mmHg (09/03 0900) SpO2:  [92 %-100 %] 96 % (09/03 0900) Last BM Date: 03/05/15  Intake/Output from previous day: 09/02 0701 - 09/03 0700 In: 3575 [P.O.:200; I.V.:2875; IV Piggyback:500] Out: 1200 [Urine:1200] Intake/Output this shift: Total I/O In: 30 [Blood:30] Out: -   Physical Exam: HEENT - sclerae clear, mucous membranes moist Neck - soft Chest - clear bilaterally Cor - RRR Abdomen - soft, mild distension; dressings dry and intact; VAC at ostomy site; few BS present Ext - no edema, non-tender Neuro - alert & oriented, no focal deficits  Lab Results:   Recent Labs  03/07/15 0443 03/08/15 0335  WBC 11.6* 13.1*  HGB 7.4* 6.7*  HCT 23.8* 21.5*  PLT 276 264   BMET  Recent Labs  03/07/15 0443 03/08/15 0335  NA 139  --   K 3.9 3.6  CL 107  --   CO2 24  --   GLUCOSE 117*  --   BUN 7  --   CREATININE 0.75 0.59  CALCIUM 9.3  --    PT/INR No results for input(s): LABPROT, INR in the last 72 hours. Comprehensive Metabolic Panel:    Component Value Date/Time   NA 139 03/07/2015 0443   NA 138 03/04/2015 0654   NA 138 12/13/2014 1224   NA 141 11/22/2014 0753   K 3.6 03/08/2015 0335   K 3.9 03/07/2015 0443   K 4.4 12/13/2014 1224   K 3.6 11/22/2014 0753   CL 107 03/07/2015 0443   CL 109 03/04/2015 0654   CO2 24 03/07/2015 0443   CO2 23 03/04/2015 0654   CO2 22 12/13/2014 1224   CO2 22 11/22/2014 0753   BUN 7 03/07/2015 0443   BUN 12 03/04/2015 0654   BUN 8.6  12/13/2014 1224   BUN 10.6 11/22/2014 0753   CREATININE 0.59 03/08/2015 0335   CREATININE 0.75 03/07/2015 0443   CREATININE 0.7 12/13/2014 1224   CREATININE 0.6 11/22/2014 0753   GLUCOSE 117* 03/07/2015 0443   GLUCOSE 100* 03/04/2015 0654   GLUCOSE 108 12/13/2014 1224   GLUCOSE 111 11/22/2014 0753   CALCIUM 9.3 03/07/2015 0443   CALCIUM 10.5* 03/04/2015 0654   CALCIUM 10.6* 12/13/2014 1224   CALCIUM 9.8 11/22/2014 0753   AST 16 03/04/2015 0654   AST 14* 01/01/2015 1035   AST 10 12/13/2014 1224   AST 13 11/22/2014 0753   ALT 9* 03/04/2015 0654   ALT 9* 01/01/2015 1035   ALT <6 12/13/2014 1224   ALT 7 11/22/2014 0753   ALKPHOS 53 03/04/2015 0654   ALKPHOS 51 01/01/2015 1035   ALKPHOS 54 12/13/2014 1224   ALKPHOS 58 11/22/2014 0753   BILITOT 0.2* 03/04/2015 0654   BILITOT 0.2* 01/01/2015 1035   BILITOT <0.20 12/13/2014 1224   BILITOT <0.20 11/22/2014 0753   PROT 7.1 03/04/2015 0654   PROT 7.3 01/01/2015 1035   PROT 7.0 12/13/2014 1224   PROT 6.6 11/22/2014 0753   ALBUMIN 3.0* 03/04/2015 4827  ALBUMIN 3.0* 01/01/2015 1035   ALBUMIN 2.6* 12/13/2014 1224   ALBUMIN 2.5* 11/22/2014 0753    Studies/Results: No results found.  Anti-infectives: Anti-infectives    Start     Dose/Rate Route Frequency Ordered Stop   03/06/15 2200  cefoTEtan (CEFOTAN) 2 g in dextrose 5 % 50 mL IVPB     2 g 100 mL/hr over 30 Minutes Intravenous Every 12 hours 03/06/15 1357 03/06/15 2313   03/06/15 1030  clindamycin (CLEOCIN) 900 mg, gentamicin (GARAMYCIN) 240 mg in sodium chloride 0.9 % 1,000 mL for intraperitoneal lavage  Status:  Discontinued      Intraperitoneal To Surgery 03/06/15 1017 03/06/15 1021   03/06/15 1030  clindamycin (CLEOCIN) 900 mg, gentamicin (GARAMYCIN) 240 mg in sodium chloride 0.9 % 1,000 mL for intraperitoneal lavage      Intraperitoneal To Surgery 03/06/15 1022 03/06/15 1406   03/06/15 1030  clindamycin (CLEOCIN) 900 mg, gentamicin (GARAMYCIN) 240 mg in sodium chloride 0.9  % 1,000 mL for intraperitoneal lavage  Status:  Discontinued      Intraperitoneal To Surgery 03/06/15 1024 03/06/15 1027   03/06/15 0600  cefoTEtan (CEFOTAN) 2 g in dextrose 5 % 50 mL IVPB     2 g 100 mL/hr over 30 Minutes Intravenous On call to O.R. 03/03/15 1205 03/06/15 0937   03/06/15 0600  clindamycin (CLEOCIN) 900 mg, gentamicin (GARAMYCIN) 240 mg in sodium chloride 0.9 % 1,000 mL for intraperitoneal lavage  Status:  Discontinued    Comments:  Pharmacy may adjust dosing strength, schedule, rate of infusion, etc as needed to optimize therapy    Intraperitoneal To Surgery 03/03/15 1205 03/06/15 1021   03/05/15 1300  neomycin (MYCIFRADIN) tablet 1,000 mg     1,000 mg Oral 3 times per day on Wed 03/03/15 1205 03/05/15 2239   03/05/15 1300  metroNIDAZOLE (FLAGYL) tablet 1,000 mg    Comments:  Take 2 pills (=1000mg ) by mouth at 1pm, 3pm, and 10pm the day before your colorectal operation   1,000 mg Oral 3 times per day on Wed 03/03/15 1205 03/05/15 2239      Assessment & Plans: Status post laparoscopic LAR, splenectomy, takedown of loop colostomy, vaginal cuff peritoneal biopsy, colon polypectomyx4---Dr. Johney [place]  Full liquid diet  OOB, ambulate with assist  Encourage IS use ABL anemia  Transfusing PRBC's this AM Pulmonary embolism  Will resume heparin drip tomorrow if Hgb stable after transfusion  Earnstine Regal, MD, [facility] Office: [place] 03/08/2015

## 2015-03-09 LAB — CBC
HCT: 29.2 % — ABNORMAL LOW (ref 36.0–46.0)
HEMOGLOBIN: 9.5 g/dL — AB (ref 12.0–15.0)
MCH: 25.8 pg — AB (ref 26.0–34.0)
MCHC: 32.5 g/dL (ref 30.0–36.0)
MCV: 79.3 fL (ref 78.0–100.0)
Platelets: 292 10*3/uL (ref 150–400)
RBC: 3.68 MIL/uL — AB (ref 3.87–5.11)
RDW: 16.6 % — ABNORMAL HIGH (ref 11.5–15.5)
WBC: 14.2 10*3/uL — AB (ref 4.0–10.5)

## 2015-03-09 LAB — TYPE AND SCREEN
ABO/RH(D): AB POS
Antibody Screen: NEGATIVE
UNIT DIVISION: 0
Unit division: 0

## 2015-03-09 LAB — HEPARIN LEVEL (UNFRACTIONATED): HEPARIN UNFRACTIONATED: 0.18 [IU]/mL — AB (ref 0.30–0.70)

## 2015-03-09 MED ORDER — HEPARIN (PORCINE) IN NACL 100-0.45 UNIT/ML-% IJ SOLN
1400.0000 [IU]/h | INTRAMUSCULAR | Status: DC
  Administered 2015-03-09: 1400 [IU]/h via INTRAVENOUS
  Filled 2015-03-09: qty 250

## 2015-03-09 MED ORDER — HEPARIN (PORCINE) IN NACL 100-0.45 UNIT/ML-% IJ SOLN
1600.0000 [IU]/h | INTRAMUSCULAR | Status: AC
  Administered 2015-03-10 – 2015-03-11 (×3): 1600 [IU]/h via INTRAVENOUS
  Filled 2015-03-09 (×7): qty 250

## 2015-03-09 NOTE — Progress Notes (Signed)
ANTICOAGULATION CONSULT NOTE - Follow Up  Pharmacy Consult for heparin  Indication:hx pulmonary embolus  Labs:  Recent Labs  03/07/15 0443 03/08/15 0335 03/09/15 0325 03/09/15 1600  HGB 7.4* 6.7* 9.5*  --   HCT 23.8* 21.5* 29.2*  --   PLT 276 264 292  --   HEPARINUNFRC  --   --   --  0.18*  CREATININE 0.75 0.59  --   --    Assessment: See pharmacist note from earlier today for further detail.   Heparin infusion resumed this morning without bolus.  First heparin level resulted subtherapeutic at 0.18 with infusion at 1400 units/hr.  Using total body weight for heparin dosing.  No bleeding/complications documented since starting heparin infusion this AM.  Goal of Therapy:  Heparin level 0.3-0.7 units/ml Monitor platelets by anticoagulation protocol: Yes  Plan:  Increase IV heparin to 1600 units/hr Check heparin level in 6 hours Daily heparin level and CBC while on heparin infusion  [name] [name] 03/09/2015,4:50 PM

## 2015-03-09 NOTE — Progress Notes (Signed)
ANTICOAGULATION CONSULT NOTE - Initial Consult  Pharmacy Consult for heparin  Indication:hx pulmonary embolus  Allergies  Allergen Reactions  . Oxycontin [Oxycodone Hcl] Anaphylaxis, Hives and Itching  . Codeine Itching and Nausea And Vomiting    Patient Measurements: Height: 5\' 5"  (165.1 cm) Weight: 154 lb 8.7 oz (70.1 kg) IBW/kg (Calculated) : 57 Heparin Dosing Weight: 70.1kg  Vital Signs: Temp: 98.9 F (37.2 C) (09/03 2351) Temp Source: Oral (09/03 2351) BP: 145/74 mmHg (09/04 0400) Pulse Rate: 110 (09/04 0400)  Labs:  Recent Labs  03/07/15 0443 03/08/15 0335 03/09/15 0325  HGB 7.4* 6.7* 9.5*  HCT 23.8* 21.5* 29.2*  PLT 276 264 292  CREATININE 0.75 0.59  --     Estimated Creatinine Clearance: 71.6 mL/min (by C-G formula based on Cr of 0.59).   Medical History: Past Medical History  Diagnosis Date  . Colon cancer     dx'd 2014  . Heart murmur     ? as a child   . Pulmonary embolism 11/2014   . Anemia     hx of     Medications:  See med rec  Assessment: Patient is a 64 y.o F with hx colon cancer-- s/p transverse loop colostomy on 10/28/14 for obstructing colon and revision of colostomy on 01/03/15.  She also as a hx of PE (11/13/14) on xarelto PTA. She presented to [facility] ED on 8/28 with prolapsed loop colostomy and has been on IV heparin for OR procedures on 9/1.  IV heparin held since 9/1 AM and pharmacy consulted to resume IV heparin on 9/4.    - xarelto 20mg  daily PTA (last dose taken on 8/28 at 1830)  Significant events:  8/30: plan for surgical intervention on 9/01.  Per [facility], d/c heparin drip 6 hours prior to surgery --> d/c drip at 0200 on 9/1.  Heparin SQ 5000 units x1 pre-op at 0600 on 9/1 (per [facility])  9/3: Post-op drop in hemoglobin and 2 units PRBCs given  9/4: Resume IV heparin per [facility]   Today, 03/09/2015:  Pt previously with therapeutic heparin level infusion rate of 1400 units/hr (8/30-8/31)  No bleeding documented  H/H improved after 2  units PRBCs 9/3  SCDs on  CrCl ~72 m/min  Goal of Therapy:  Heparin level 0.3-0.7 units/ml Monitor platelets by anticoagulation protocol: Yes  Plan:  Restart IV heparin at 1400 units/hr (no bolus) Follow up heparin level in 6 hours Follow up CBC at least q72 hour, renal fxn, signs/symptoms of bleeding  Neala Miggins E 03/09/2015,8:40 AM

## 2015-03-09 NOTE — Progress Notes (Signed)
Patient ID: [name] [name], female   DOB: Sep 22, 1950, 64 y.o.   MRN: [id number]  [facility]  POD#: 3  Subjective: Patient up in bed, tolerating regular diet this AM.  OOB to bathroom.  Loose BM's.  Objective: Vital signs in last 24 hours: Temp:  [98 F (36.7 C)-98.9 F (37.2 C)] 98.9 F (37.2 C) (09/03 2351) Pulse Rate:  [107-119] 110 (09/04 0400) Resp:  [14-24] 18 (09/04 0400) BP: (110-145)/(54-74) 145/74 mmHg (09/04 0400) SpO2:  [93 %-97 %] 95 % (09/04 0400) Last BM Date: 03/08/15  Intake/Output from previous day: 09/03 0701 - 09/04 0700 In: 1040 [I.V.:375; Blood:665] Out: -  Intake/Output this shift:    Physical Exam: HEENT - sclerae clear, mucous membranes moist Neck - soft Chest - clear bilaterally Cor - RRR Abdomen - soft, BS present; VAC intact RUQ wound Ext - no edema, non-tender Neuro - alert & oriented, no focal deficits  Lab Results:   Recent Labs  03/08/15 0335 03/09/15 0325  WBC 13.1* 14.2*  HGB 6.7* 9.5*  HCT 21.5* 29.2*  PLT 264 292   BMET  Recent Labs  03/07/15 0443 03/08/15 0335  NA 139  --   K 3.9 3.6  CL 107  --   CO2 24  --   GLUCOSE 117*  --   BUN 7  --   CREATININE 0.75 0.59  CALCIUM 9.3  --    PT/INR No results for input(s): LABPROT, INR in the last 72 hours. Comprehensive Metabolic Panel:    Component Value Date/Time   NA 139 03/07/2015 0443   NA 138 03/04/2015 0654   NA 138 12/13/2014 1224   NA 141 11/22/2014 0753   K 3.6 03/08/2015 0335   K 3.9 03/07/2015 0443   K 4.4 12/13/2014 1224   K 3.6 11/22/2014 0753   CL 107 03/07/2015 0443   CL 109 03/04/2015 0654   CO2 24 03/07/2015 0443   CO2 23 03/04/2015 0654   CO2 22 12/13/2014 1224   CO2 22 11/22/2014 0753   BUN 7 03/07/2015 0443   BUN 12 03/04/2015 0654   BUN 8.6 12/13/2014 1224   BUN 10.6 11/22/2014 0753   CREATININE 0.59 03/08/2015 0335   CREATININE 0.75 03/07/2015 0443   CREATININE 0.7 12/13/2014 1224   CREATININE  0.6 11/22/2014 0753   GLUCOSE 117* 03/07/2015 0443   GLUCOSE 100* 03/04/2015 0654   GLUCOSE 108 12/13/2014 1224   GLUCOSE 111 11/22/2014 0753   CALCIUM 9.3 03/07/2015 0443   CALCIUM 10.5* 03/04/2015 0654   CALCIUM 10.6* 12/13/2014 1224   CALCIUM 9.8 11/22/2014 0753   AST 16 03/04/2015 0654   AST 14* 01/01/2015 1035   AST 10 12/13/2014 1224   AST 13 11/22/2014 0753   ALT 9* 03/04/2015 0654   ALT 9* 01/01/2015 1035   ALT <6 12/13/2014 1224   ALT 7 11/22/2014 0753   ALKPHOS 53 03/04/2015 0654   ALKPHOS 51 01/01/2015 1035   ALKPHOS 54 12/13/2014 1224   ALKPHOS 58 11/22/2014 0753   BILITOT 0.2* 03/04/2015 0654   BILITOT 0.2* 01/01/2015 1035   BILITOT <0.20 12/13/2014 1224   BILITOT <0.20 11/22/2014 0753   PROT 7.1 03/04/2015 0654   PROT 7.3 01/01/2015 1035   PROT 7.0 12/13/2014 1224   PROT 6.6 11/22/2014 0753   ALBUMIN 3.0* 03/04/2015 0654   ALBUMIN 3.0* 01/01/2015 1035   ALBUMIN 2.6* 12/13/2014 1224   ALBUMIN 2.5* 11/22/2014 0753    Studies/Results: No  results found.  Anti-infectives: Anti-infectives    Start     Dose/Rate Route Frequency Ordered Stop   03/06/15 2200  cefoTEtan (CEFOTAN) 2 g in dextrose 5 % 50 mL IVPB     2 g 100 mL/hr over 30 Minutes Intravenous Every 12 hours 03/06/15 1357 03/06/15 2313   03/06/15 1030  clindamycin (CLEOCIN) 900 mg, gentamicin (GARAMYCIN) 240 mg in sodium chloride 0.9 % 1,000 mL for intraperitoneal lavage  Status:  Discontinued      Intraperitoneal To Surgery 03/06/15 1017 03/06/15 1021   03/06/15 1030  clindamycin (CLEOCIN) 900 mg, gentamicin (GARAMYCIN) 240 mg in sodium chloride 0.9 % 1,000 mL for intraperitoneal lavage      Intraperitoneal To Surgery 03/06/15 1022 03/06/15 1406   03/06/15 1030  clindamycin (CLEOCIN) 900 mg, gentamicin (GARAMYCIN) 240 mg in sodium chloride 0.9 % 1,000 mL for intraperitoneal lavage  Status:  Discontinued      Intraperitoneal To Surgery 03/06/15 1024 03/06/15 1027   03/06/15 0600  cefoTEtan (CEFOTAN) 2  g in dextrose 5 % 50 mL IVPB     2 g 100 mL/hr over 30 Minutes Intravenous On call to O.R. 03/03/15 1205 03/06/15 0937   03/06/15 0600  clindamycin (CLEOCIN) 900 mg, gentamicin (GARAMYCIN) 240 mg in sodium chloride 0.9 % 1,000 mL for intraperitoneal lavage  Status:  Discontinued    Comments:  Pharmacy may adjust dosing strength, schedule, rate of infusion, etc as needed to optimize therapy    Intraperitoneal To Surgery 03/03/15 1205 03/06/15 1021   03/05/15 1300  neomycin (MYCIFRADIN) tablet 1,000 mg     1,000 mg Oral 3 times per day on Wed 03/03/15 1205 03/05/15 2239   03/05/15 1300  metroNIDAZOLE (FLAGYL) tablet 1,000 mg    Comments:  Take 2 pills (=1000mg ) by mouth at 1pm, 3pm, and 10pm the day before your colorectal operation   1,000 mg Oral 3 times per day on Wed 03/03/15 1205 03/05/15 2239      Assessment & Plans: Status post laparoscopic LAR, splenectomy, takedown of loop colostomy, vaginal cuff peritoneal biopsy, colon polypectomyx4---Dr. Johney Maine Regular diet OOB, ambulate with assist Encourage IS use ABL anemia hgb improved with transfusion Pulmonary embolism Resume heparin drip and monitor Hgb level  Earnstine Regal, MD, [facility] Office: [place] 03/09/2015

## 2015-03-10 LAB — HEPARIN LEVEL (UNFRACTIONATED)
HEPARIN UNFRACTIONATED: 0.36 [IU]/mL (ref 0.30–0.70)
Heparin Unfractionated: 0.32 IU/mL (ref 0.30–0.70)

## 2015-03-10 LAB — CBC
HCT: 27.8 % — ABNORMAL LOW (ref 36.0–46.0)
Hemoglobin: 9 g/dL — ABNORMAL LOW (ref 12.0–15.0)
MCH: 25.7 pg — ABNORMAL LOW (ref 26.0–34.0)
MCHC: 32.4 g/dL (ref 30.0–36.0)
MCV: 79.4 fL (ref 78.0–100.0)
PLATELETS: 380 10*3/uL (ref 150–400)
RBC: 3.5 MIL/uL — ABNORMAL LOW (ref 3.87–5.11)
RDW: 17.4 % — AB (ref 11.5–15.5)
WBC: 10.1 10*3/uL (ref 4.0–10.5)

## 2015-03-10 MED ORDER — KCL IN DEXTROSE-NACL 20-5-0.45 MEQ/L-%-% IV SOLN
INTRAVENOUS | Status: DC
  Administered 2015-03-10 – 2015-03-12 (×2): via INTRAVENOUS
  Filled 2015-03-10 (×5): qty 1000

## 2015-03-10 MED ORDER — HYDROMORPHONE HCL 2 MG PO TABS
1.0000 mg | ORAL_TABLET | ORAL | Status: DC | PRN
  Administered 2015-03-10 – 2015-03-12 (×9): 2 mg via ORAL
  Filled 2015-03-10 (×9): qty 1

## 2015-03-10 NOTE — Progress Notes (Signed)
ANTICOAGULATION CONSULT NOTE - Follow-up Consult  Pharmacy Consult for heparin  Indication:hx pulmonary embolus  Allergies  Allergen Reactions  . Oxycontin [Oxycodone Hcl] Anaphylaxis, Hives and Itching  . Codeine Itching and Nausea And Vomiting    Patient Measurements: Height: 5\' 5"  (165.1 cm) Weight: 171 lb 1.2 oz (77.6 kg) IBW/kg (Calculated) : 57 Heparin Dosing Weight: 70.1kg  Vital Signs: Temp: 98.4 F (36.9 C) (09/05 0330) Temp Source: Oral (09/05 0330) BP: 136/68 mmHg (09/05 0400) Pulse Rate: 103 (09/05 0400)  Labs:  Recent Labs  03/08/15 0335 03/09/15 0325 03/09/15 1600 03/10/15 0145  HGB 6.7* 9.5*  --  9.0*  HCT 21.5* 29.2*  --  27.8*  PLT 264 292  --  380  HEPARINUNFRC  --   --  0.18* 0.32  CREATININE 0.59  --   --   --     Estimated Creatinine Clearance: 75 mL/min (by C-G formula based on Cr of 0.59).   Medical History: Past Medical History  Diagnosis Date  . Colon cancer     dx'd 2014  . Heart murmur     ? as a child   . Pulmonary embolism 11/2014   . Anemia     hx of     Medications:  See med rec  Assessment: Patient is a 64 y.o F with hx colon cancer-- s/p transverse loop colostomy on 10/28/14 for obstructing colon and revision of colostomy on 01/03/15.  She also as a hx of PE (11/13/14) on xarelto PTA. She presented to [facility] ED on 8/28 with prolapsed loop colostomy and has been on IV heparin for OR procedures on 9/1.  IV heparin held since 9/1 AM and pharmacy consulted to resume IV heparin on 9/4.    - xarelto 20mg  daily PTA (last dose taken on 8/28 at 1830)  Significant events:  8/30: plan for surgical intervention on 9/01.  Per [facility], d/c heparin drip 6 hours prior to surgery --> d/c drip at 0200 on 9/1.  Heparin SQ 5000 units x1 pre-op at 0600 on 9/1 (per [facility])  9/3: Post-op drop in hemoglobin and 2 units PRBCs given  9/4: Resume IV heparin per [facility]CS   Today, 03/10/2015:  IV heparin level remains therapeutic on 1600 units/hr   No bleeding  documented  H/H improved after 2 units PRBCs 9/3, stable overnight  SCDs on  CrCl ~75 m/min  Goal of Therapy:  Heparin level 0.3-0.7 units/ml Monitor platelets by anticoagulation protocol: Yes  Plan:  Continue IV heparin at 1600 units/hr Follow up daily heparin level, CBC at least q72 hour Monitor renal fxn, signs/symptoms of bleeding Follow up anticoagulation plans for resuming xarelto  Ralene Bathe, PharmD, BCPS 03/10/2015, 9:00 AM  Pager: [phone]

## 2015-03-10 NOTE — Progress Notes (Signed)
ANTICOAGULATION CONSULT NOTE - Follow Up Consult  Pharmacy Consult for Heparin Indication: pulmonary embolus  Allergies  Allergen Reactions  . Oxycontin [Oxycodone Hcl] Anaphylaxis, Hives and Itching  . Codeine Itching and Nausea And Vomiting    Patient Measurements: Height: 5\' 5"  (165.1 cm) Weight: 171 lb 1.2 oz (77.6 kg) IBW/kg (Calculated) : 57 Heparin Dosing Weight:   Vital Signs: Temp: 98.4 F (36.9 C) (09/05 0330) Temp Source: Oral (09/05 0330) BP: 110/94 mmHg (09/05 0000) Pulse Rate: 112 (09/05 0000)  Labs:  Recent Labs  03/08/15 0335 03/09/15 0325 03/09/15 1600 03/10/15 0145  HGB 6.7* 9.5*  --  9.0*  HCT 21.5* 29.2*  --  27.8*  PLT 264 292  --  380  HEPARINUNFRC  --   --  0.18* 0.32  CREATININE 0.59  --   --   --     Estimated Creatinine Clearance: 75 mL/min (by C-G formula based on Cr of 0.59).   Medications:  Infusions:  . dextrose 5% lactated ringers 125 mL/hr at 03/10/15 0300  . heparin 1,600 Units/hr (03/10/15 0300)    Assessment: Patient with heparin level at goal.  No heparin issues noted.  Goal of Therapy:  Heparin level 0.3-0.7 units/ml Monitor platelets by anticoagulation protocol: Yes   Plan:  Continue heparin drip at current rate Recheck level with AM labs  [name], [name] 03/10/2015,6:10 AM

## 2015-03-10 NOTE — Progress Notes (Signed)
Patient ID: [name] [name], female   DOB: 1951/07/03, 64 y.o.   MRN: [id number]  [facility]  POD#: 4  Subjective: Patient back in bed, comfortable.  Tolerating regular diet.  Heparin drip restarted.  Objective: Vital signs in last 24 hours: Temp:  [98.2 F (36.8 C)-98.8 F (37.1 C)] 98.4 F (36.9 C) (09/05 0800) Pulse Rate:  [103-117] 103 (09/05 0400) Resp:  [14-22] 14 (09/05 0400) BP: (101-157)/(68-94) 136/68 mmHg (09/05 0400) SpO2:  [95 %-100 %] 95 % (09/05 0400) Weight:  [77.6 kg (171 lb 1.2 oz)] 77.6 kg (171 lb 1.2 oz) (09/05 0330) Last BM Date: 03/09/15  Intake/Output from previous day: 09/04 0701 - 09/05 0700 In: 2780.7 [I.V.:2780.7] Out: -  Intake/Output this shift:    Physical Exam: HEENT - sclerae clear, mucous membranes moist Neck - soft Chest - clear bilaterally Cor - RRR (<100 now) Abdomen - soft without distension; VAC intact Ext - no edema, non-tender Neuro - alert & oriented, no focal deficits  Lab Results:   Recent Labs  03/09/15 0325 03/10/15 0145  WBC 14.2* 10.1  HGB 9.5* 9.0*  HCT 29.2* 27.8*  PLT 292 380   BMET  Recent Labs  03/08/15 0335  K 3.6  CREATININE 0.59   PT/INR No results for input(s): LABPROT, INR in the last 72 hours. Comprehensive Metabolic Panel:    Component Value Date/Time   NA 139 03/07/2015 0443   NA 138 03/04/2015 0654   NA 138 12/13/2014 1224   NA 141 11/22/2014 0753   K 3.6 03/08/2015 0335   K 3.9 03/07/2015 0443   K 4.4 12/13/2014 1224   K 3.6 11/22/2014 0753   CL 107 03/07/2015 0443   CL 109 03/04/2015 0654   CO2 24 03/07/2015 0443   CO2 23 03/04/2015 0654   CO2 22 12/13/2014 1224   CO2 22 11/22/2014 0753   BUN 7 03/07/2015 0443   BUN 12 03/04/2015 0654   BUN 8.6 12/13/2014 1224   BUN 10.6 11/22/2014 0753   CREATININE 0.59 03/08/2015 0335   CREATININE 0.75 03/07/2015 0443   CREATININE 0.7 12/13/2014 1224   CREATININE 0.6 11/22/2014 0753   GLUCOSE 117*  03/07/2015 0443   GLUCOSE 100* 03/04/2015 0654   GLUCOSE 108 12/13/2014 1224   GLUCOSE 111 11/22/2014 0753   CALCIUM 9.3 03/07/2015 0443   CALCIUM 10.5* 03/04/2015 0654   CALCIUM 10.6* 12/13/2014 1224   CALCIUM 9.8 11/22/2014 0753   AST 16 03/04/2015 0654   AST 14* 01/01/2015 1035   AST 10 12/13/2014 1224   AST 13 11/22/2014 0753   ALT 9* 03/04/2015 0654   ALT 9* 01/01/2015 1035   ALT <6 12/13/2014 1224   ALT 7 11/22/2014 0753   ALKPHOS 53 03/04/2015 0654   ALKPHOS 51 01/01/2015 1035   ALKPHOS 54 12/13/2014 1224   ALKPHOS 58 11/22/2014 0753   BILITOT 0.2* 03/04/2015 0654   BILITOT 0.2* 01/01/2015 1035   BILITOT <0.20 12/13/2014 1224   BILITOT <0.20 11/22/2014 0753   PROT 7.1 03/04/2015 0654   PROT 7.3 01/01/2015 1035   PROT 7.0 12/13/2014 1224   PROT 6.6 11/22/2014 0753   ALBUMIN 3.0* 03/04/2015 0654   ALBUMIN 3.0* 01/01/2015 1035   ALBUMIN 2.6* 12/13/2014 1224   ALBUMIN 2.5* 11/22/2014 0753    Studies/Results: No results found.  Anti-infectives: Anti-infectives    Start     Dose/Rate Route Frequency Ordered Stop   03/06/15 2200  cefoTEtan (CEFOTAN) 2 g in  dextrose 5 % 50 mL IVPB     2 g 100 mL/hr over 30 Minutes Intravenous Every 12 hours 03/06/15 1357 03/06/15 2313   03/06/15 1030  clindamycin (CLEOCIN) 900 mg, gentamicin (GARAMYCIN) 240 mg in sodium chloride 0.9 % 1,000 mL for intraperitoneal lavage  Status:  Discontinued      Intraperitoneal To Surgery 03/06/15 1017 03/06/15 1021   03/06/15 1030  clindamycin (CLEOCIN) 900 mg, gentamicin (GARAMYCIN) 240 mg in sodium chloride 0.9 % 1,000 mL for intraperitoneal lavage      Intraperitoneal To Surgery 03/06/15 1022 03/06/15 1406   03/06/15 1030  clindamycin (CLEOCIN) 900 mg, gentamicin (GARAMYCIN) 240 mg in sodium chloride 0.9 % 1,000 mL for intraperitoneal lavage  Status:  Discontinued      Intraperitoneal To Surgery 03/06/15 1024 03/06/15 1027   03/06/15 0600  cefoTEtan (CEFOTAN) 2 g in dextrose 5 % 50 mL IVPB     2  g 100 mL/hr over 30 Minutes Intravenous On call to O.R. 03/03/15 1205 03/06/15 0937   03/06/15 0600  clindamycin (CLEOCIN) 900 mg, gentamicin (GARAMYCIN) 240 mg in sodium chloride 0.9 % 1,000 mL for intraperitoneal lavage  Status:  Discontinued    Comments:  Pharmacy may adjust dosing strength, schedule, rate of infusion, etc as needed to optimize therapy    Intraperitoneal To Surgery 03/03/15 1205 03/06/15 1021   03/05/15 1300  neomycin (MYCIFRADIN) tablet 1,000 mg     1,000 mg Oral 3 times per day on Wed 03/03/15 1205 03/05/15 2239   03/05/15 1300  metroNIDAZOLE (FLAGYL) tablet 1,000 mg    Comments:  Take 2 pills (=1000mg ) by mouth at 1pm, 3pm, and 10pm the day before your colorectal operation   1,000 mg Oral 3 times per day on Wed 03/03/15 1205 03/05/15 2239      Assessment & Plans: Status post laparoscopic LAR, splenectomy, takedown of loop colostomy, vaginal cuff peritoneal biopsy, colon polypectomyx4---Dr. Johney Maine Regular diet OOB, ambulate with assist Encourage IS use ABL anemia hgb improved with transfusion Pulmonary embolism Resumed heparin drip and monitoring Hgb level  Doing well this AM.  Will transfer to floor.  Earnstine Regal, MD, [facility] Office: [place] 03/10/2015

## 2015-03-11 LAB — CBC
HEMATOCRIT: 30.7 % — AB (ref 36.0–46.0)
HEMOGLOBIN: 9.7 g/dL — AB (ref 12.0–15.0)
MCH: 25.2 pg — AB (ref 26.0–34.0)
MCHC: 31.6 g/dL (ref 30.0–36.0)
MCV: 79.7 fL (ref 78.0–100.0)
Platelets: 448 10*3/uL — ABNORMAL HIGH (ref 150–400)
RBC: 3.85 MIL/uL — ABNORMAL LOW (ref 3.87–5.11)
RDW: 18.1 % — AB (ref 11.5–15.5)
WBC: 8.5 10*3/uL (ref 4.0–10.5)

## 2015-03-11 LAB — HEPARIN LEVEL (UNFRACTIONATED): Heparin Unfractionated: 0.44 IU/mL (ref 0.30–0.70)

## 2015-03-11 MED ORDER — BOOST PLUS PO LIQD
237.0000 mL | Freq: Three times a day (TID) | ORAL | Status: DC
  Administered 2015-03-11 – 2015-03-12 (×2): 237 mL via ORAL
  Filled 2015-03-11 (×5): qty 237

## 2015-03-11 MED ORDER — HYDROMORPHONE HCL 1 MG/ML IJ SOLN
INTRAMUSCULAR | Status: AC
  Administered 2015-03-11: 1 mg
  Filled 2015-03-11: qty 1

## 2015-03-11 MED ORDER — RIVAROXABAN 20 MG PO TABS
20.0000 mg | ORAL_TABLET | Freq: Every day | ORAL | Status: DC
  Administered 2015-03-11: 20 mg via ORAL
  Filled 2015-03-11 (×2): qty 1

## 2015-03-11 MED ORDER — HYDROMORPHONE HCL 1 MG/ML IJ SOLN
1.0000 mg | Freq: Once | INTRAMUSCULAR | Status: AC
  Administered 2015-03-11: 1 mg via INTRAVENOUS

## 2015-03-11 NOTE — Care Management Note (Signed)
Case Management Note  Patient Details  Name: [name] [name] MRN: [id number] Date of Birth: [birth year]  Subjective/Objective:             Status post laparoscopic LAR, splenectomy, takedown of loop colostomy, vaginal cuff peritoneal biopsy, colon polypectomyx4       Action/Plan: Discharge planning, spoke with patient at bedside. Patient is understanding that she will need wound vac at d/c, has used [facility] previously for ostomy care. Wants to use [facility] again for wound care. Contacted [facility] to arrange, will begin application for wound vac today.   Expected Discharge Date:                  Expected Discharge Plan:  [place]  In-House Referral:  NA  Discharge planning Services  CM Consult  Post Acute Care Choice:  Home Health Choice offered to:  Patient  DME Arranged:  Vac DME Agency:  [facility]  HH Arranged:  RN, Disease Management [place] Agency:  [place]  Status of Service:  Completed, signed off  Medicare Important Message Given:    Date Medicare IM Given:    Medicare IM give by:    Date Additional Medicare IM Given:    Additional Medicare Important Message give by:     If discussed at [place] of Stay Meetings, dates discussed:    Additional Comments:  Guadalupe Maple, RN 03/11/2015, 12:40 PM

## 2015-03-11 NOTE — Progress Notes (Signed)
ANTICOAGULATION CONSULT NOTE - Follow-up Consult  Pharmacy Consult for heparin --> xarelto Indication:hx pulmonary embolus  Allergies  Allergen Reactions  . Oxycontin [Oxycodone Hcl] Anaphylaxis, Hives and Itching  . Codeine Itching and Nausea And Vomiting    Patient Measurements: Height: 5\' 5"  (165.1 cm) Weight: 149 lb 11.1 oz (67.9 kg) IBW/kg (Calculated) : 57 Heparin Dosing Weight: 70.1kg  Vital Signs: Temp: 98.4 F (36.9 C) (09/06 0600) Temp Source: Oral (09/06 0600) BP: 121/63 mmHg (09/06 0600) Pulse Rate: 88 (09/06 0600)  Labs:  Recent Labs  03/09/15 0325  03/10/15 0145 03/10/15 0820 03/11/15 0520  HGB 9.5*  --  9.0*  --  9.7*  HCT 29.2*  --  27.8*  --  30.7*  PLT 292  --  380  --  448*  HEPARINUNFRC  --   < > 0.32 0.36 0.44  < > = values in this interval not displayed.  Estimated Creatinine Clearance: 65.6 mL/min (by C-G formula based on Cr of 0.59).   Medical History: Past Medical History  Diagnosis Date  . Colon cancer     dx'd 2014  . Heart murmur     ? as a child   . Pulmonary embolism 11/2014   . Anemia     hx of     Medications:  See med rec  Assessment: Patient is a 64 y.o F with hx colon cancer-- s/p transverse loop colostomy on 10/28/14 for obstructing colon and revision of colostomy on 01/03/15.  She also as a hx of PE (11/13/14) on xarelto PTA. She presented to [facility] ED on 8/28 with prolapsed loop colostomy and has been on IV heparin for OR procedures on 9/1.  IV heparin held since 9/1 AM and pharmacy consulted to resume IV heparin on 9/4.    - xarelto 20mg  daily PTA (last dose taken on 8/28 at 1830)  Significant events:  8/30: plan for surgical intervention on 9/01.  Per [facility], d/c heparin drip 6 hours prior to surgery --> d/c drip at 0200 on 9/1.  Heparin SQ 5000 units x1 pre-op at 0600 on 9/1 (per [facility])  9/3: Post-op drop in hemoglobin and 2 units PRBCs given  9/4: Resume IV heparin per [facility]  9/6: Transition from heparin to xarelto    Today, 03/11/2015:  IV heparin level remains therapeutic on 1600 units/hr   No bleeding documented  CBC stable  CrCl ~66 m/min  With plan to discharge patient in the 1-2 days, to transition from heparin back to xarelto today   Goal of Therapy:  Heparin level 0.3-0.7 units/ml Monitor platelets by anticoagulation protocol: Yes  Plan:  - xarelto 20mg  daily with supper - Continue IV heparin at 1600 units/hr and d/c drip at 1700 or when first dose of xarelto is given  - Monitor renal fxn, signs/symptoms of bleeding  Dia Sitter, PharmD, BCPS 03/11/2015 7:51 AM

## 2015-03-11 NOTE — Progress Notes (Signed)
Wound vac dressing changed with PA present. Dressing change painful, PA ordered 1mg  IV dilaudid, effective. Metiplel to base per PA. Wound measured at 2.5cmx 10.5cmx2cm. Patient tolerated well after additional medication.

## 2015-03-11 NOTE — Progress Notes (Signed)
5 Days Post-Op  Subjective: Tolerating diet.  Had one bowel movement yesterday but it was small.  Passing lots of flatus.  No nausea or vomiting.  Needs help getting out of bed. Still on heparin drip because of history of pulmonary embolism earlier this year.  Was on Xarelto at home and supervised by Dr. Burr Medico. WBC 8500.  Hemoglobin 9.7.  Platelet count 448,000. Objective: Vital signs in last 24 hours: Temp:  [98.3 F (36.8 C)-98.4 F (36.9 C)] 98.4 F (36.9 C) (09/06 0600) Pulse Rate:  [88-121] 88 (09/06 0600) Resp:  [14-30] 20 (09/06 0600) BP: (121-155)/(63-95) 121/63 mmHg (09/06 0600) SpO2:  [98 %-100 %] 100 % (09/06 0600) Weight:  [67.9 kg (149 lb 11.1 oz)] 67.9 kg (149 lb 11.1 oz) (09/06 0600) Last BM Date: 03/09/15  Intake/Output from previous day: 09/05 0701 - 09/06 0700 In: 1545.2 [I.V.:1545.2] Out: 3050 [Urine:3050] Intake/Output this shift: Total I/O In: 350 [I.V.:350] Out: 2050 [Urine:2050]  General appearance: Alert.  Oriented.  Cooperative.  A little bit deconditioned.  Soft-spoken. Resp: Lungs essentially  clear.  I don't hear any rhonchi or egophony at left base. GI: Abdomen soft.  No distention.  Back intact.  Trocar sites look good.  Lab Results:  Results for orders placed or performed during the hospital encounter of 03/02/15 (from the past 24 hour(s))  Heparin level (unfractionated)     Status: None   Collection Time: 03/10/15  8:20 AM  Result Value Ref Range   Heparin Unfractionated 0.36 0.30 - 0.70 IU/mL  CBC     Status: Abnormal   Collection Time: 03/11/15  5:20 AM  Result Value Ref Range   WBC 8.5 4.0 - 10.5 K/uL   RBC 3.85 (L) 3.87 - 5.11 MIL/uL   Hemoglobin 9.7 (L) 12.0 - 15.0 g/dL   HCT 30.7 (L) 36.0 - 46.0 %   MCV 79.7 78.0 - 100.0 fL   MCH 25.2 (L) 26.0 - 34.0 pg   MCHC 31.6 30.0 - 36.0 g/dL   RDW 18.1 (H) 11.5 - 15.5 %   Platelets 448 (H) 150 - 400 K/uL  Heparin level (unfractionated)     Status: None   Collection Time: 03/11/15  5:20 AM   Result Value Ref Range   Heparin Unfractionated 0.44 0.30 - 0.70 IU/mL     Studies/Results: No results found.  . sodium chloride   Intravenous Once  . acetaminophen  1,000 mg Oral TID  . antiseptic oral rinse  7 mL Mouth Rinse BID  . feeding supplement  1 Container Oral TID BM  . lip balm  1 application Topical BID  . saccharomyces boulardii  250 mg Oral BID     Assessment/Plan: s/p Procedure(s): LAPAROSCOPIC takedown loop colostomy  SPLENECTOMY LOW ANTERIOR BOWEL RESECTION, rigid proctoscopy transverse POLYPECTOMY x 4  POD #5. Status post laparoscopic LAR, splenectomy, takedown of loop colostomy, vaginal cuff peritoneal biopsy, colon polypectomyx4---Dr. Johney Maine Regular diet OOB, ambulate with assist Encourage IS use             Check pathology  Disposition.  Home soon.  I told her that she might be ready for discharge tomorrow or Thursday.  I have requested case management organized home health nursing for wound and negative pressure dressing.  ABL anemia hgb improved with transfusion  Pulmonary embolism Resumed heparin drip and monitoring Hgb level.  Has been on Xarelto since earlier this year and will ask pharmacy to consult and manage transition from heparin to Xarelto.  This is managed  by Dr. Burr Medico as outpatient.   @PROBHOSP @  LOS: 8 days    Aleana Fifita M 03/11/2015  . .prob

## 2015-03-11 NOTE — Progress Notes (Signed)
Initial Nutrition Assessment  DOCUMENTATION CODES:   Non-severe (moderate) malnutrition in context of chronic illness  INTERVENTION:   D/C Boost Breeze Provide Boost Plus TID, each provides 360 kcal and 14g protein. Encourage PO intake RD to continue to monitor  NUTRITION DIAGNOSIS:   Malnutrition related to chronic illness as evidenced by energy intake < or equal to 75% for > or equal to 1 month, moderate depletion of body fat, moderate depletions of muscle mass.  GOAL:   Patient will meet greater than or equal to 90% of their needs  MONITOR:   PO intake, Supplement acceptance, Labs, Weight trends, Skin, I & O's  REASON FOR ASSESSMENT:   Malnutrition Screening Tool    ASSESSMENT:   64 y.o. admitted for a prolapsing RUQ loop colostomy. She had a transverse loop colostomy for obstructing stage IV colon cancer by Dr. Zella Richer 10/28/2014.  Pt states that she has had no appetite changes recently and her weight has remained stable (~140 lb.) Pt states that she grazes on food at home and eats things like vegetables, carbohydrates and greens. Pt does not care for fruit. Pt does not like the Boost Breeze for this reason. RD to order chocolate Boost for patient.  Per weight history, pt was documented weighing 177 lb in July. Pt states this was an error. Pt states she has remained between 140-150 lb.   Nutrition-Focused physical exam completed. Findings are moderate fat depletion, moderate muscle depletion, and no edema.   Labs reviewed: Low Mg  Diet Order:  Diet - low sodium heart healthy Diet regular Room service appropriate?: Yes; Fluid consistency:: Thin  Skin:  Reviewed, no issues  Last BM:  9/4  Height:   Ht Readings from Last 1 Encounters:  03/06/15 5\' 5"  (1.651 m)    Weight:   Wt Readings from Last 1 Encounters:  03/11/15 149 lb 11.1 oz (67.9 kg)    Ideal Body Weight:  56.8 kg  BMI:  Body mass index is 24.91 kg/(m^2).  Estimated Nutritional Needs:    Kcal:  1700-1900  Protein:  80-90g  Fluid:  1.8L/day  EDUCATION NEEDS:   Education needs addressed  Clayton Bibles, MS, RD, LDN Pager: [phone] After Hours Pager: [phone]

## 2015-03-11 NOTE — Progress Notes (Signed)
Wound vac change:  It looks very good, it was very hard removing dressing.  I gave her some extra IV dilaudid.   I am going to have them place some mepitel at the base.  Picture below:

## 2015-03-12 DIAGNOSIS — D62 Acute posthemorrhagic anemia: Secondary | ICD-10-CM | POA: Diagnosis not present

## 2015-03-12 LAB — CBC
HCT: 31 % — ABNORMAL LOW (ref 36.0–46.0)
Hemoglobin: 9.8 g/dL — ABNORMAL LOW (ref 12.0–15.0)
MCH: 25.3 pg — ABNORMAL LOW (ref 26.0–34.0)
MCHC: 31.6 g/dL (ref 30.0–36.0)
MCV: 79.9 fL (ref 78.0–100.0)
PLATELETS: 505 10*3/uL — AB (ref 150–400)
RBC: 3.88 MIL/uL (ref 3.87–5.11)
RDW: 18.6 % — ABNORMAL HIGH (ref 11.5–15.5)
WBC: 7.2 10*3/uL (ref 4.0–10.5)

## 2015-03-12 LAB — BASIC METABOLIC PANEL
ANION GAP: 6 (ref 5–15)
BUN: 7 mg/dL (ref 6–20)
CALCIUM: 10.8 mg/dL — AB (ref 8.9–10.3)
CO2: 24 mmol/L (ref 22–32)
Chloride: 108 mmol/L (ref 101–111)
Creatinine, Ser: 0.56 mg/dL (ref 0.44–1.00)
GFR calc Af Amer: >60 mL/min (ref >60)
Glucose, Bld: 108 mg/dL — ABNORMAL HIGH (ref 65–99)
POTASSIUM: 4 mmol/L (ref 3.5–5.1)
SODIUM: 138 mmol/L (ref 135–145)

## 2015-03-12 MED ORDER — BOOST PLUS PO LIQD: 237.0000 mL | Freq: Three times a day (TID) | ORAL | Status: DC

## 2015-03-12 MED ORDER — SACCHAROMYCES BOULARDII 250 MG PO CAPS: 250.0000 mg | ORAL_CAPSULE | Freq: Two times a day (BID) | ORAL | Status: DC

## 2015-03-12 MED ORDER — HYDROCODONE-ACETAMINOPHEN 5-325 MG PO TABS: 1.0000 | ORAL_TABLET | Freq: Four times a day (QID) | ORAL | Status: DC | PRN

## 2015-03-12 MED ORDER — ACETAMINOPHEN 500 MG PO TABS: 1000.0000 mg | ORAL_TABLET | Freq: Three times a day (TID) | ORAL | Status: DC

## 2015-03-12 MED ORDER — HYDROCODONE-ACETAMINOPHEN 5-325 MG PO TABS: 1.0000 | ORAL_TABLET | ORAL | Status: DC | PRN

## 2015-03-12 NOTE — Discharge Summary (Signed)
[facility] Discharge Summary   Patient ID: [name] [name] MRN: [id number] DOB/AGE: 63-04-1951 63 y.o.  Admit date: 03/02/2015 Discharge date: 03/12/2015  Admitting Diagnosis: Same as below  Discharge Diagnosis Patient Active Problem List   Diagnosis Date Noted  . Postoperative anemia due to acute blood loss 03/12/2015  . Metastatic colon cancer to liver 03/06/2015  . Long-term (current) use of anticoagulants 03/03/2015  . 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 Pulmonary embolism 11/14/2014  . 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 Anxiety   . Upper abdominal pain   . Nausea with vomiting 06/12/2014  . 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 Colorectal cancer, stage IV, obstructing s/p stent x2, ostomy, then LAR 03/06/2015 06/05/2013  . Family history of colorectal cancer 06/05/2013  . Thrush, oral 06/05/2013  . Tachycardia 06/05/2013  . Hypercalcemia 05/24/2013    Consultants WOC nursing   Imaging: No results found.  Procedures Dr. Johney Maine (03/06/15) - LAPAROSCOPIC LOW ANTERIOR RECTOSIGMOID RESECTION, LAPAROSCOPIC SPLENECTOMY, TAKEDOWN LOOP COLOSTOMY, VAGINAL CUFF PERITONEAL BIOPSY, COLON POLYPECTOMY x 4, RIGID PROCTOSCOPY  Hospital Course:  64 y.o. AA female admitted for a prolapsing RUQ loop colostomy.  She had a transverse loop colostomy for obstructing stage IV colon cancer by Dr. Zella Richer 10/28/2014. He did a revision of the loop colostomy with partial colectomy on 01/03/2015.  He saw in the [facility] on 02/22/2015 for another prolapse of her loop colostomy.  Her last abdominal CT scan was 11/13/2014 (prior to colostomy revision) showing a lot of prolapsing colon.  Dr. Burr Medico is her treating oncologist. H/o PE in May.  She has received FOLFOX and FOLFIRI. She has also had capacitain.Her CEA on 12/13/2014 was 10.9. It was as high as 286 on 07/23/2013. It was last in the normal range - 4.1 on 09/13/2014.  At this time, she is not on chemotx.   She was  admitted due to prolapsing colostomy with pain.  She underwent procedure listed above.  Tolerated procedure well and was transferred to the SDU for close monitoring.  Diet was advanced as tolerated.  Anticoagulation was held for 36 hours due to low Hgb and tachycardia.  She was given transfusion of 2 pRBC units.  Eventually she was resumed on heparin on POD #4 and transitioned to Xarelto on POD #5.  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  We discussed  this further and then she was agreeable.  These were given on 03/12/15 (HIB, influenza, meningococcal, and pneumococcal).  She may require repeat pneumococcal vaccine, but will leave this up to Dr. Johney Maine and her oncologist.  She was transferred to the floor on POD #4.  On POD #6, the patient was voiding well, tolerating diet, ambulating well, pain well controlled, vital signs stable, wound clean, and felt stable for discharge home with [facility] services.  Patient will follow up in our office in 2 weeks and knows to call with questions or concerns.  She will call to confirm appointment date/time.  She is set up with [facility] care for wound management with wound vac.  The wound vac can be switched to M/W/F if needed.  Pathology is still pending.  She is encouraged to call Dr. Clyda Greener nurse at the office if she has not heard the results by Friday.     Physical Exam: General: pleasant, WD/WN AA female who is laying in bed in NAD HEENT: head is normocephalic, atraumatic.  Sclera are noninjected.  Ears  and nose without any masses or lesions.  Mouth is pink and moist Heart: regular, rate, and rhythm.  Normal s1,s2. No obvious murmurs, gallops, or rubs noted.  Palpable radial and pedal pulses bilaterally Lungs: CTAB, no wheezes, rhonchi, or rales noted.  Respiratory effort nonlabored Abd: soft, NT/ND, +BS, no masses, hernias, or organomegaly, wound vac in RLQ (old colostomy site) with sanguinous  drainage Psych: A&Ox3 with an appropriate affect.     Medication List    TAKE these medications        acetaminophen 500 MG tablet  Commonly known as:  TYLENOL  Take 500 mg by mouth every 6 (six) hours as needed for moderate pain or headache.     acetaminophen 500 MG tablet  Commonly known as:  TYLENOL  Take 2 tablets (1,000 mg total) by mouth 3 (three) times daily.     HYDROcodone-acetaminophen 5-325 MG per tablet  Commonly known as:  NORCO/VICODIN  Take 1-2 tablets by mouth every 6 (six) hours as needed for moderate pain or severe pain.     HYDROmorphone 2 MG tablet  Commonly known as:  DILAUDID  Take 1-2 tablets (2-4 mg total) by mouth every 4 (four) hours as needed for moderate pain or severe pain.     IRON PO  Take 1 tablet by mouth every morning.     lactose free nutrition Liqd  Take 237 mLs by mouth 3 (three) times daily with meals.     lidocaine-prilocaine cream  Commonly known as:  EMLA  Apply 1 application topically as needed. Apply to port site one hour before treatment and cover with plastic wrap.     LORazepam 0.5 MG tablet  Commonly known as:  ATIVAN  Take 1 tablet (0.5 mg total) by mouth every 8 (eight) hours as needed (nausea/vomiting).     ondansetron 8 MG tablet  Commonly known as:  ZOFRAN  Take 8mg  by mouth every 12 hours as needed for nausea/vomiting.  Take 8 mg by mouth  30 minutes before taking chemo pills, and as needed for nausea/vomiting.     polyethylene glycol powder powder  Commonly known as:  GLYCOLAX/MIRALAX  TAKE 34 G (2 DOSES) BY MOUTH DAILY AS NEEDED FOR MILD CONSTIPATION OR MODERATE CONSTIPATION     PRESCRIPTION MEDICATION  CHEMO [facility]     prochlorperazine 10 MG tablet  Commonly known as:  COMPAZINE  TAKE 1 TABLET (10 MG TOTAL) BY MOUTH EVERY 6 (SIX) HOURS AS NEEDED (NAUSEA OR VOMITING).     rivaroxaban 20 MG Tabs tablet  Commonly known as:  XARELTO  Take 1 tablet (20 mg total) by mouth daily with supper.     saccharomyces  boulardii 250 MG capsule  Commonly known as:  FLORASTOR  Take 1 capsule (250 mg total) by mouth 2 (two) times daily.     VITAMIN B 12 PO  Take 1 tablet by mouth every morning.         Follow-up Information    Follow up with GROSS,STEVEN C., MD. Schedule an appointment as soon as possible for a visit in 2 weeks.   Specialty:  General Surgery   Why:  To follow up after your operation, To follow up after your hospital stay.  Call the office to confirm an appoitment date/time.     Contact information:   [address] [place] [address] [phone]       Follow up with [facility].   Why:  Nurse for wound  care   Contact information:   [address] [place] [address] [phone]       Signed: Vaughan Basta [facility] [phone]  03/12/2015, 10:47 AM

## 2015-03-20 ENCOUNTER — Telehealth: Payer: Self-pay | Admitting: *Deleted

## 2015-03-20 NOTE — Telephone Encounter (Signed)
Patient called.  She has been in the hospital and wants to know if she should keep her appt.next week to have her PAC flushed?   She reports they did not use it in the hospital.  Last time it was flushed was 02-13-15.  Yes, she should keep her appt. To have this flushed on 03-27-15.

## 2015-03-21 NOTE — Anesthesia Postprocedure Evaluation (Signed)
  Anesthesia Post-op Note  Patient: [name] [name]  Procedure(s) Performed: Procedure(s) (LRB): LAPAROSCOPIC takedown loop colostomy  (N/A) SPLENECTOMY (N/A) LOW ANTERIOR BOWEL RESECTION, rigid proctoscopy transverse POLYPECTOMY x 4  Patient Location: PACU  Anesthesia Type: General  Level of Consciousness: awake and alert   Airway and Oxygen Therapy: Patient Spontanous Breathing  Post-op Pain: mild  Post-op Assessment: Post-op Vital signs reviewed, Patient's Cardiovascular Status Stable, Respiratory Function Stable, Patent Airway and No signs of Nausea or vomiting  Last Vitals:  Filed Vitals:   03/12/15 0531  BP: 134/63  Pulse: 91  Temp: 36.8 C  Resp: 14    Post-op Vital Signs: stable   Complications: No apparent anesthesia complications

## 2015-03-27 ENCOUNTER — Other Ambulatory Visit: Payer: Self-pay | Admitting: Internal Medicine

## 2015-03-27 ENCOUNTER — Ambulatory Visit (HOSPITAL_BASED_OUTPATIENT_CLINIC_OR_DEPARTMENT_OTHER): Payer: Medicaid Other

## 2015-03-27 DIAGNOSIS — Z452 Encounter for adjustment and management of vascular access device: Secondary | ICD-10-CM | POA: Diagnosis present

## 2015-03-27 DIAGNOSIS — C19 Malignant neoplasm of rectosigmoid junction: Secondary | ICD-10-CM

## 2015-03-27 DIAGNOSIS — Z95828 Presence of other vascular implants and grafts: Secondary | ICD-10-CM

## 2015-03-27 MED ORDER — SODIUM CHLORIDE 0.9 % IJ SOLN
10.0000 mL | INTRAMUSCULAR | Status: DC | PRN
  Administered 2015-03-27: 10 mL via INTRAVENOUS
  Filled 2015-03-27: qty 10

## 2015-03-27 MED ORDER — HEPARIN SOD (PORK) LOCK FLUSH 100 UNIT/ML IV SOLN
500.0000 [IU] | Freq: Once | INTRAVENOUS | Status: AC
  Administered 2015-03-27: 500 [IU] via INTRAVENOUS
  Filled 2015-03-27: qty 5

## 2015-03-27 NOTE — Patient Instructions (Signed)

## 2015-03-31 ENCOUNTER — Other Ambulatory Visit: Payer: Self-pay | Admitting: *Deleted

## 2015-03-31 DIAGNOSIS — C19 Malignant neoplasm of rectosigmoid junction: Secondary | ICD-10-CM

## 2015-03-31 MED ORDER — LIDOCAINE-PRILOCAINE 2.5-2.5 % EX KIT: PACK | CUTANEOUS | Status: DC

## 2015-03-31 NOTE — Telephone Encounter (Signed)
[facility] [place]. Left message requesting Emla cream refill for this patient.

## 2015-04-11 ENCOUNTER — Other Ambulatory Visit (HOSPITAL_BASED_OUTPATIENT_CLINIC_OR_DEPARTMENT_OTHER): Payer: Medicaid Other

## 2015-04-11 ENCOUNTER — Ambulatory Visit (HOSPITAL_BASED_OUTPATIENT_CLINIC_OR_DEPARTMENT_OTHER): Payer: Medicaid Other

## 2015-04-11 ENCOUNTER — Telehealth: Payer: Self-pay | Admitting: Hematology

## 2015-04-11 ENCOUNTER — Encounter: Payer: Self-pay | Admitting: Hematology

## 2015-04-11 ENCOUNTER — Ambulatory Visit (HOSPITAL_BASED_OUTPATIENT_CLINIC_OR_DEPARTMENT_OTHER): Payer: Medicaid Other | Admitting: Hematology

## 2015-04-11 VITALS — BP 120/77 | HR 109 | Temp 98.4°F | Resp 18 | Ht 65.0 in | Wt 144.5 lb

## 2015-04-11 DIAGNOSIS — C7989 Secondary malignant neoplasm of other specified sites: Secondary | ICD-10-CM

## 2015-04-11 DIAGNOSIS — C189 Malignant neoplasm of colon, unspecified: Secondary | ICD-10-CM

## 2015-04-11 DIAGNOSIS — C787 Secondary malignant neoplasm of liver and intrahepatic bile duct: Secondary | ICD-10-CM

## 2015-04-11 DIAGNOSIS — D509 Iron deficiency anemia, unspecified: Secondary | ICD-10-CM

## 2015-04-11 DIAGNOSIS — Z86711 Personal history of pulmonary embolism: Secondary | ICD-10-CM

## 2015-04-11 DIAGNOSIS — C19 Malignant neoplasm of rectosigmoid junction: Secondary | ICD-10-CM | POA: Diagnosis present

## 2015-04-11 DIAGNOSIS — D6481 Anemia due to antineoplastic chemotherapy: Secondary | ICD-10-CM | POA: Diagnosis not present

## 2015-04-11 DIAGNOSIS — D500 Iron deficiency anemia secondary to blood loss (chronic): Secondary | ICD-10-CM

## 2015-04-11 LAB — CBC WITH DIFFERENTIAL/PLATELET
BASO%: 1.3 % (ref 0.0–2.0)
BASOS ABS: 0.1 10*3/uL (ref 0.0–0.1)
EOS ABS: 0.2 10*3/uL (ref 0.0–0.5)
EOS%: 3.2 % (ref 0.0–7.0)
HCT: 34.9 % (ref 34.8–46.6)
HGB: 11.2 g/dL — ABNORMAL LOW (ref 11.6–15.9)
LYMPH%: 23.4 % (ref 14.0–49.7)
MCH: 25.7 pg (ref 25.1–34.0)
MCHC: 32.2 g/dL (ref 31.5–36.0)
MCV: 80 fL (ref 79.5–101.0)
MONO#: 0.4 10*3/uL (ref 0.1–0.9)
MONO%: 6.2 % (ref 0.0–14.0)
NEUT#: 3.9 10*3/uL (ref 1.5–6.5)
NEUT%: 65.9 % (ref 38.4–76.8)
Platelets: 354 10*3/uL (ref 145–400)
RBC: 4.37 10*6/uL (ref 3.70–5.45)
RDW: 21.1 % — ABNORMAL HIGH (ref 11.2–14.5)
WBC: 6 10*3/uL (ref 3.9–10.3)
lymph#: 1.4 10*3/uL (ref 0.9–3.3)

## 2015-04-11 LAB — COMPREHENSIVE METABOLIC PANEL (CC13)
ALK PHOS: 77 U/L (ref 40–150)
ALT: 11 U/L (ref 0–55)
AST: 18 U/L (ref 5–34)
Albumin: 3.8 g/dL (ref 3.5–5.0)
Anion Gap: 8 mEq/L (ref 3–11)
BUN: 13.6 mg/dL (ref 7.0–26.0)
CO2: 25 meq/L (ref 22–29)
Calcium: 12.1 mg/dL — ABNORMAL HIGH (ref 8.4–10.4)
Chloride: 106 mEq/L (ref 98–109)
Creatinine: 0.7 mg/dL (ref 0.6–1.1)
GLUCOSE: 86 mg/dL (ref 70–140)
POTASSIUM: 3.9 meq/L (ref 3.5–5.1)
SODIUM: 139 meq/L (ref 136–145)
Total Bilirubin: <0.3 mg/dL (ref 0.20–1.20)
Total Protein: 8 g/dL (ref 6.4–8.3)

## 2015-04-11 LAB — IRON AND TIBC CHCC
%SAT: 15 % — ABNORMAL LOW (ref 21–57)
IRON: 55 ug/dL (ref 41–142)
TIBC: 366 ug/dL (ref 236–444)
UIBC: 311 ug/dL (ref 120–384)

## 2015-04-11 LAB — FERRITIN CHCC: Ferritin: 31 ng/ml (ref 9–269)

## 2015-04-11 MED ORDER — SODIUM CHLORIDE 0.9 % IV SOLN
Freq: Once | INTRAVENOUS | Status: AC
  Administered 2015-04-11: 16:00:00 via INTRAVENOUS

## 2015-04-11 MED ORDER — ZOLEDRONIC ACID 4 MG/100ML IV SOLN
4.0000 mg | Freq: Once | INTRAVENOUS | Status: AC
  Administered 2015-04-11: 4 mg via INTRAVENOUS
  Filled 2015-04-11: qty 100

## 2015-04-11 NOTE — Telephone Encounter (Signed)
Gave and printed appt sched and avs for pt for NOV and DEc °

## 2015-04-11 NOTE — Telephone Encounter (Signed)
lvm for pt regarding to OCT appt....pt ok and aware

## 2015-04-11 NOTE — Patient Instructions (Signed)

## 2015-04-11 NOTE — Progress Notes (Signed)
[place] ONCOLOGY OFFICE PROGRESS NOTE   DIAGNOSIS: Metastatic colon cancer to liver and peritoneum   MOLECULAR FEATURES ([facility]):   Chief complaint: Follow-up colon cancer  CURRENT TREATMENT:  Observation  Oncology History   Colorectal cancer, stage IV, obstructing s/p colonic diversion   Staging form: Colon and Rectum, AJCC 7th Edition     Clinical: Stage IVA (TX, N0, M1a) - Signed by Concha Norway, MD on 09/15/2013       Colorectal cancer, stage IV, obstructing s/p stent x2, ostomy, then [facility] 03/06/2015   05/22/2013 - 05/27/2013 Hospital Admission A/w abdominal distention and constipation for 2 days CT scan of her abdomen and pelvis showed area of bowel wall thickening at the rectosigmoid junction concerning for malignancy.   05/22/2013 Imaging CT of Abdomen: bowel wall thickening with shouldering of the proximal and distal margins, involving therectosigmoid junction, supicous liver spleen mets. Colonsocopy recommended.    05/23/2013 Tumor Marker CEA 225.7   05/24/2013 Imaging MRI of abdomen: 3.7 x 4.4 cm enhancing lesion along the superior spleen, indeterminate but worrisome for metastasis.2.7 x 3.9 cm enhancing lesion in the medial segment left hepatic lobe,    05/25/2013 Pathology Results Diagnosis Rectum, biopsy, proximal - INVASIVE ADENOCARCINOMA.   05/25/2013 Procedure Flex sig: (Dr. Ardis Hughs). 5-6 cm obstructing rectosigmoid mass distal end 10 cm from anal verge; mass biopsed and then stented 9 cm long, 22 cm diameter uncovered SEM.    05/25/2013 Initial Diagnosis Colorectal cancer, stage IV   06/25/2013 Procedure R port a cath placement.   07/09/2013 - 08/20/2013 Chemotherapy FOLFOX q 2 weeks started.  Not elgible for clinical trial and bevacimab based on stent and high risk for perforation. She received a total of 4 cycles.    07/30/2013 Tumor Marker KRAS positive.  p53, APC identified. Mismatch Repair (MMR) preserved.    08/06/2013 Adverse Reaction Complains  of darkening of her hands with peeling.    08/27/2013 Family History She was seen by Genetic couselor.  She declined testing today, but will call and reschedule an appointment should she decide to pursue testing.   09/07/2013 Imaging CT abdomen: Mixed response to therapy. Response to therapy of hepatic metastasis. right hepatic hemangiomas are again identified. Other too small to characterize liver lesions are felt to be similar but are indeterminate.. Enlargement of a splenic lesions   09/13/2013 - 06/10/2014 Chemotherapy Second line chemo FOLFIRI for a total of 16 cycles    09/13/2013 Tumor Marker CEA 33.5   09/13/2013 Treatment Plan Change Reviewed scans consistent with mixed response.  Switch to FOLFIRI, she received a total of 16 cycles, with a few breaks due to hospitalization and chemo holiday.    10/29/2013 Adverse Reaction Patient reports intense abdominal cramping lasting the first few days on chemotherapy.  Starting Hyoscyamine 0.125 mg q 6 hours prn.    11/15/2013 - 11/15/2013 Hospital Admission Patient presented to ED with migrated stent.  Stent retrieved by Dr. Ardis Hughs.  Imaging negative for perforation.   Discussed in conference with referral to Dr. Zella Richer made.    11/23/2013 Imaging CT Chest. 1. Stable left lower lobe 5 mm pulmonary nodule. 2. Interval increase in size splenic mass is concerning formetastatic lesion.3. Interval calcification of the left hepatic lobe metastasis. Noevidence of active residual disease by CT.   07/01/2014 Imaging CT restaging scan showed stable disease, improved liver lesion.    07/16/2014 - 10/24/2014 Chemotherapy maintenance chemo with capacitain 1040m/m2 (18083m bid started, dose decresed to 150078mid on C1D8 due to  tolerance issue. Pt declined surgery. Chemo held due to hospitalization and disease progression.    08/16/2014 - 08/20/2014 Hospital Admission She was admitted for bowel obstruction from sigmoid colon mass. She declined surgery, and had sigmoid colon  stent placement. Her symptoms resolved afterwards.   10/25/2014 - 10/31/2014 Hospital Admission Admitted for bowel obstruction, CT scan showed sigmoid colon tumor growth through the stent, pt underwent Transverse loop colostomy on 10/28/2014.    11/13/2014 - 11/18/2014 Hospital Admission she was admitted for transverse loop colostomy prolapse, managed conservatively. CT chest showed RLL PE, discharged home on Xarelto    01/03/2015 - 01/07/2015 Hospital Admission palliative repair of loop colostomy prolapse with partial colectomy 01/03/15   03/05/2015 Imaging CT chest, abdomen and pelvis showed known colorectal  Them, multiple new liver metastasis,. Noticed enhancing splenic lesion continues to enlarge, nonobstructive calculus in the left renal collecting system.   03/06/2015 Surgery Laparoscopic low anterior rectosigmoid to resection, splenectomy, takedown loop colostomy, vaginal cuff peritoneal biopsy, colon polypectomyx4    03/06/2015 Pathology Results Well differentiated adenocarcinoma in rectosigmoid colon, 32 lymph nodes were negative, vaginal cuff biopsy was positive for metastatic adenocarcinoma, [place] with plasma cell infiltrate and fibrosis, no metastatic carcinoma.    INTERVAL HISTORY:  [name] [name] 64 y.o. female with a history of Stage IV colon cancer is here for follow-up.  She underwent  laparoscopic low anterior rectosigmoid tumor resection, splenectomy, takedown loop colostomy by Dr. Johney Maine on 03/06/2015. She tolerated the procedure well, and has recovered very well. She was seen by Dr. Johney Maine earlier this week. Her bowel movements normal, she denies any significant pain, nausea, or other symptoms. Her appetite is great, and she has gained some weight back. Her energy level is much better, she is able to tolerate routine activity without any difficulty. She feels she is back to her normal life again. She is quite satisfied.    Past Medical History  Diagnosis Date  . Colon cancer ([place])     dx'd  2014  . Heart murmur     ? as a child   . Pulmonary embolism ([place]) 11/2014   . Anemia     hx of     ALLERGIES:  is allergic to oxycontin and codeine.  MEDICATIONS: has a current medication list which includes the following prescription(s): acetaminophen, iron, lactose free nutrition, lidocaine-prilocaine, lorazepam, polyethylene glycol powder, rivaroxaban, cyanocobalamin, hydrocodone-acetaminophen, hydromorphone, ondansetron, PRESCRIPTION MEDICATION, and prochlorperazine.  SURGICAL HISTORY:  Past Surgical History  Procedure Laterality Date  . Abdominal hysterectomy  2005  . Cesarean section  1976  . Flexible sigmoidoscopy N/A 05/25/2013    Procedure: FLEXIBLE SIGMOIDOSCOPY;  Surgeon: [name], MD;  Location: [facility];  Service: Endoscopy;  Laterality: N/A;  needs floro  . Colonic stent placement N/A 05/25/2013    Procedure: COLONIC STENT PLACEMENT;  Surgeon: [name], MD;  Location: [facility];  Service: Endoscopy;  Laterality: N/A;  . Portacath placement Right 06/25/2013    Procedure: ULTRA SOUND GUIDED INSERTION PORT-A-CATH;  Surgeon: [name], MD;  Location: [facility];  Service: General;  Laterality: Right;  . Flexible sigmoidoscopy N/A 08/19/2014    Procedure: FLEXIBLE SIGMOIDOSCOPY;  Surgeon: Gatha Mayer, MD;  Location: [facility];  Service: Endoscopy;  Laterality: N/A;  . Colonic stent placement N/A 08/19/2014    Procedure: COLONIC STENT PLACEMENT;  Surgeon: Gatha Mayer, MD;  Location: [facility];  Service: Endoscopy;  Laterality: N/A;  . Flexible sigmoidoscopy N/A 08/19/2014    Procedure:  FLEXIBLE SIGMOIDOSCOPY;  Surgeon: Gatha Mayer, MD;  Location: [facility];  Service: Endoscopy;  Laterality: N/A;  . Colonic stent placement N/A 08/19/2014    Procedure: COLONIC STENT PLACEMENT;  Surgeon: Gatha Mayer, MD;  Location: [facility];  Service: Endoscopy;  Laterality: N/A;  . Colon resection N/A 10/28/2014    Procedure:  Transverse loop colostomy;  Surgeon: [name], MD;  Location: [facility];  Service: General;  Laterality: N/A;  . Colostomy revision N/A 01/03/2015    Procedure: REVISION OF TRANSVERSE LOOP COLOSTOMY WITH PARTIAL  COLECTOMY;  Surgeon: [name], MD;  Location: [facility];  Service: General;  Laterality: N/A;  . Laparoscopic low anterior resection  03/06/2015    03/06/2015  . Laparoscopic splenectomy  03/06/2015  . Colon resection N/A 03/06/2015    Procedure: LAPAROSCOPIC takedown loop colostomy ;  Surgeon: Michael Boston, MD;  Location: [facility];  Service: General;  Laterality: N/A;  . 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 Bowel resection  03/06/2015    Procedure: LOW ANTERIOR BOWEL RESECTION, rigid proctoscopy;  Surgeon: Michael Boston, MD;  Location: [facility];  Service: General;;  . Polypectomy  03/06/2015    Procedure: transverse POLYPECTOMY x 4;  Surgeon: Michael Boston, MD;  Location: [facility];  Service: General;;    REVIEW OF SYSTEMS:   Constitutional: Denies fevers, chills or abnormal weight loss Eyes: Denies blurriness of vision Ears, nose, mouth, throat, and face: Denies mucositis or sore throat Respiratory: Denies cough, dyspnea or wheezes Cardiovascular: Denies palpitation, chest discomfort or lower extremity swelling Gastrointestinal:  Denies nausea, heartburn or change in bowel habits Skin: Denies abnormal skin rashes Lymphatics: Denies new lymphadenopathy or easy bruising Neurological:Denies numbness, tingling or new weaknesses Behavioral/Psych: Mood is stable, no new changes  All other systems were reviewed with the patient and are negative.  PHYSICAL EXAMINATION: ECOG PERFORMANCE STATUS: 1  Blood pressure 120/77, pulse 109, temperature 98.4 F (36.9 C), temperature source Oral, resp. rate 18, height _0  (1.651 m), weight 144 lb 8 oz (65.545 kg), SpO2 100 %.  GENERAL:alert, no  distress and comfortable; well-developed, well nourished.  SKIN: skin color, texture, turgor are normal, no rashes or significant lesions; + R Port a cath without tenderness; hyperpigmentation of her hands.  EYES: normal, Conjunctiva are pink and non-injected, sclera clear  OROPHARYNX:no exudate, no erythema and lips, buccal mucosa  NECK: supple, thyroid normal size, non-tender, without nodularity  LYMPH: no palpable lymphadenopathy in the cervical, axillary or supraclavicular  LUNGS: clear to auscultation and percussion with normal breathing effort  HEART: Tachycardic with regular rhythm and no murmurs and no lower extremity edema  ABDOMEN:abdomen soft, non-tender and normal bowel sounds.  There is a small shallow ulcer in the right lateral abdomen on previous ostomy site, covered by gauze. Bowel sounds normal.   Musculoskeletal:no cyanosis of digits and no clubbing  NEURO: alert & oriented x 3 with fluent speech, no focal motor/sensory deficits EXTREMITIES: no leg swollen.  Labs:  CBC Latest Ref Rng 04/11/2015 03/12/2015 03/11/2015  WBC 3.9 - 10.3 10e3/uL 6.0 7.2 8.5  Hemoglobin 11.6 - 15.9 g/dL 11.2(L) 9.8(L) 9.7(L)  Hematocrit 34.8 - 46.6 % 34.9 31.0(L) 30.7(L)  Platelets 145 - 400 10e3/uL 354 505(H) 448(H)   CMP Latest Ref Rng 04/11/2015 03/12/2015 03/08/2015  Glucose 70 - 140 mg/dl 86 108(H) -  BUN 7.0 - 26.0 mg/dL 13.6 7 -  Creatinine 0.6 - 1.1 mg/dL 0.7 0.56 0.59  Sodium 136 - 145 mEq/L 139 138 -  Potassium 3.5 - 5.1 mEq/L 3.9 4.0 3.6  Chloride 101 - 111 mmol/L - 108 -  CO2 22 - 29 mEq/L 25 24 -  Calcium 8.4 - 10.4 mg/dL 12.1(H) 10.8(H) -  Total Protein 6.4 - 8.3 g/dL 8.0 - -  Total Bilirubin 0.20 - 1.20 mg/dL <0.30 - -  Alkaline Phos 40 - 150 U/L 77 - -  AST 5 - 34 U/L 18 - -  ALT 0 - 55 U/L 11 - -   PATHOLOGY REPORT Diagnosis 03/06/2015 1. Vagina, biopsy, vaginal cuff - METASTATIC ADENOCARCINOMA, SEE COMMENT. 2. Colon, polyp(s), transverse - TUBULAR ADENOMA WITH HIGH GRADE  DYSPLASIA (X2), SEE COMMENT. - TUBULAR ADENOMA (X2). - NO INVASIVE CARCINOMA IDENTIFIED. 3. Colon, colostomy stoma - FINDINGS CONSISTENT WITH COLOSTOMY STOMA. - NO MALIGNANCY IDENTIFIED. 4. Colon, segmental resection for tumor, rectosigmoid cancer - INVASIVE ADENOCARCINOMA, WELL DIFFERENTIATED, SPANNING 7.6 CM. - TUMOR INVADES THROUGH SEROSA. - RESECTION MARGINS ARE NEGATIVE. - THIRTY-TWO OF THIRTY-TWO LYMPH NODES NEGATIVE FOR CARCINOMA (0/32). - SEE ONCOLOGY TABLE. 5. Colon, resection margin (donut), proximal - BENIGN COLONIC MUCOSA. - NO DYSPLASIA OR MALIGNANCY. 6. Colon, resection margin (donut), distal - BENIGN COLONIC MUCOSA. - NO DYSPLASIA OR MALIGNANCY. 7. Spleen, possible metastasis - SPLEEN WITH PLASMA CELL INFILTRATE AND FIBROSIS, SEE COMMENT. - NO METASTATIC CARCINOMA IDENTIFIED.  Pathologic Staging: ypT4a, ypN0, ypM1b   RADIOGRAPHIC STUDIES: CT chest, abdomen and pelvis 11/13/2014 Hepatobiliary: Again noted is a large liver lesion centered in the right lobe of the liver measuring approximately 7.6 x 4.7 cm, which is similar in appearance to prior studies demonstrating early peripheral nodular enhancement with progressive centripetal filling, compatible with a cavernous hemangioma. However, today's study demonstrates a new 2.3 x 2.2 cm hypovascular lesion posterior to the cavernous hemangioma in segments 6/7 (image 57 of series 2), concerning for a new metastatic lesion. There is also a new hypovascular lesion in segment 2 (image 51 of series 2) measuring 2.5 x 2.3 cm. Several other smaller low-attenuation lesions are noted, but are too small to definitively characterize, but similar to prior examinations (potentially small cysts). No intra or extrahepatic biliary ductal dilatation. Gallbladder is normal in appearance. Partially calcified lesion in segment 2 of the liver is unchanged, and may either represent a treated metastatic lesion or a calcified granuloma.  Other smaller cavernous hemangioma measuring 2.1 cm in segment 8 (image 48 of series 2) is similar to prior Examinations.  IMPRESSION: 1. Large stent in place extending from the distal sigmoid colon into the proximal rectum traversing the known colorectal neoplasm which has significantly increased in size compared to the prior study, with greater extension into the surrounding mesorectal/mesocolic fat, with enlarging (but not technically enlarged) mesorectal lymph nodes and new liver lesions, as detailed above, compatible with new hepatic metastases. 2. Previously noted enhancing splenic lesion continues to enlarge and remains concerning for possible metastasis. 3. No definite signs of metastatic disease to the thorax at this time. 4. Nonobstructive calculi in the left renal collecting system measuring up to 8 mm in the lower pole. 5. Additional incidental findings, similar to prior studies, as above.   ASSESSMENT: [name] [name] 64 y.o. female with a history of stage IV colon adenocarcinoma, KRAS mutation(+), on capecitabine maintenance therapy..   1. 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 Now she her primary rectosigmoid colon cancer has been removed, she still has multiple small liver metastasis (biopsy confirmed in December 2014), and a small peritoneal disease 9 positive vaginal cuff biopsy) -She has recovered well, I encouraged her to consider restart chemotherapy, she is more open for chemotherapy now, but does not want get too sick from chemotherapy. -She previously tolerated 5/fu or capecitabine poorly, she is very hesitated to go back. I discussed the option ofsSingle agent irinotecan with avastin (she never had before), she agrees to try. She prefers to postpone until after Thanksgiving holiday. She has a important  anniversary event on 05/18/2015. -Due to the K-ras mutation, she is not candidate for EGFR inhibitor therapy -Her tumor has stable MSI, unlikely will response to immunotherapy -We discussed the option of clinical trial, she is not interested. -I'll see her back in 1 months for follow-up, and likely start her on chemotherapy after Thanksgiving.  2. PE diagnosed on 11/13/2014 -Continue Xarelto. She is tolerating well, will continue   3. Anemia, secondary to chemo, and surgery  -Improved, hemoglobin 11.2 today   -She is taking ferrous sulfate -Continue monitoring   4. 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  -Ca 12.1 today, significantly higher than before, will repeat zometa  -zometa 64m iv on 07/25/13 and 06/12/2014. She had some abdominal discomfort and after Zometa infusion, she was reluctant to have more  -We'll continue watch.   PLAN -zometa infusion today or tomorrow  -RTC on 10/4 for lab, port flush and visit.   All questions were answered. The patient knows to call the clinic with any problems, questions or concerns. We can certainly see the patient much sooner if necessary.  I spent 25 minutes counseling the patient face to face. The total time spent in the appointment was 30 minutes.  FTruitt Merle 04/11/2015

## 2015-04-14 LAB — CEA: CEA: 15.1 ng/mL — AB (ref 0.0–5.0)

## 2015-04-17 ENCOUNTER — Telehealth: Payer: Self-pay | Admitting: *Deleted

## 2015-04-17 NOTE — Telephone Encounter (Signed)
Added lab appt with flush appt for 05/09/15.

## 2015-04-17 NOTE — Telephone Encounter (Signed)
TC from patient this morning to cancel appt for Zometa for 04/18/15 as she had the Zometa last Friday, 04/11/15. Pt's next appt is 05/09/15 and she wants to make sure she has labs that day to re-check her calcium. No labs ordered at this time. Please advise

## 2015-04-18 ENCOUNTER — Ambulatory Visit: Payer: Medicaid Other

## 2015-05-09 ENCOUNTER — Telehealth: Payer: Self-pay | Admitting: Hematology

## 2015-05-09 ENCOUNTER — Ambulatory Visit (HOSPITAL_BASED_OUTPATIENT_CLINIC_OR_DEPARTMENT_OTHER): Payer: Medicaid Other

## 2015-05-09 ENCOUNTER — Encounter: Payer: Self-pay | Admitting: Hematology

## 2015-05-09 ENCOUNTER — Other Ambulatory Visit (HOSPITAL_BASED_OUTPATIENT_CLINIC_OR_DEPARTMENT_OTHER): Payer: Medicaid Other

## 2015-05-09 ENCOUNTER — Ambulatory Visit (HOSPITAL_BASED_OUTPATIENT_CLINIC_OR_DEPARTMENT_OTHER): Payer: Medicaid Other | Admitting: Hematology

## 2015-05-09 VITALS — BP 113/90 | HR 89 | Temp 98.3°F | Resp 18 | Ht 65.0 in | Wt 149.0 lb

## 2015-05-09 DIAGNOSIS — C787 Secondary malignant neoplasm of liver and intrahepatic bile duct: Secondary | ICD-10-CM

## 2015-05-09 DIAGNOSIS — C19 Malignant neoplasm of rectosigmoid junction: Secondary | ICD-10-CM

## 2015-05-09 DIAGNOSIS — C786 Secondary malignant neoplasm of retroperitoneum and peritoneum: Secondary | ICD-10-CM | POA: Diagnosis not present

## 2015-05-09 DIAGNOSIS — D6481 Anemia due to antineoplastic chemotherapy: Secondary | ICD-10-CM | POA: Diagnosis not present

## 2015-05-09 DIAGNOSIS — Z7901 Long term (current) use of anticoagulants: Secondary | ICD-10-CM | POA: Diagnosis not present

## 2015-05-09 DIAGNOSIS — Z86711 Personal history of pulmonary embolism: Secondary | ICD-10-CM

## 2015-05-09 DIAGNOSIS — C189 Malignant neoplasm of colon, unspecified: Secondary | ICD-10-CM

## 2015-05-09 DIAGNOSIS — I2699 Other pulmonary embolism without acute cor pulmonale: Secondary | ICD-10-CM

## 2015-05-09 DIAGNOSIS — Z95828 Presence of other vascular implants and grafts: Secondary | ICD-10-CM

## 2015-05-09 LAB — COMPREHENSIVE METABOLIC PANEL (CC13)
ALK PHOS: 77 U/L (ref 40–150)
ALT: 11 U/L (ref 0–55)
AST: 15 U/L (ref 5–34)
Albumin: 3.4 g/dL — ABNORMAL LOW (ref 3.5–5.0)
Anion Gap: 5 mEq/L (ref 3–11)
BUN: 13.7 mg/dL (ref 7.0–26.0)
CO2: 23 mEq/L (ref 22–29)
CREATININE: 0.7 mg/dL (ref 0.6–1.1)
Calcium: 11.3 mg/dL — ABNORMAL HIGH (ref 8.4–10.4)
Chloride: 110 mEq/L — ABNORMAL HIGH (ref 98–109)
EGFR: >90 mL/min/{1.73_m2} (ref >90)
GLUCOSE: 88 mg/dL (ref 70–140)
Potassium: 4.4 mEq/L (ref 3.5–5.1)
SODIUM: 138 meq/L (ref 136–145)
TOTAL PROTEIN: 7.2 g/dL (ref 6.4–8.3)

## 2015-05-09 LAB — CBC WITH DIFFERENTIAL/PLATELET
BASO%: 0.6 % (ref 0.0–2.0)
Basophils Absolute: 0 10*3/uL (ref 0.0–0.1)
EOS ABS: 0.2 10*3/uL (ref 0.0–0.5)
EOS%: 3.9 % (ref 0.0–7.0)
HCT: 31.1 % — ABNORMAL LOW (ref 34.8–46.6)
HEMOGLOBIN: 10 g/dL — AB (ref 11.6–15.9)
LYMPH%: 20.7 % (ref 14.0–49.7)
MCH: 26.2 pg (ref 25.1–34.0)
MCHC: 32.1 g/dL (ref 31.5–36.0)
MCV: 81.5 fL (ref 79.5–101.0)
MONO#: 0.5 10*3/uL (ref 0.1–0.9)
MONO%: 8.4 % (ref 0.0–14.0)
NEUT%: 66.4 % (ref 38.4–76.8)
NEUTROS ABS: 4.1 10*3/uL (ref 1.5–6.5)
Platelets: 370 10*3/uL (ref 145–400)
RBC: 3.82 10*6/uL (ref 3.70–5.45)
RDW: 19.4 % — AB (ref 11.2–14.5)
WBC: 6.2 10*3/uL (ref 3.9–10.3)
lymph#: 1.3 10*3/uL (ref 0.9–3.3)

## 2015-05-09 MED ORDER — SODIUM CHLORIDE 0.9 % IJ SOLN
10.0000 mL | INTRAMUSCULAR | Status: DC | PRN
  Administered 2015-05-09: 10 mL via INTRAVENOUS
  Filled 2015-05-09: qty 10

## 2015-05-09 MED ORDER — HEPARIN SOD (PORK) LOCK FLUSH 100 UNIT/ML IV SOLN
500.0000 [IU] | Freq: Once | INTRAVENOUS | Status: AC
  Administered 2015-05-09: 500 [IU] via INTRAVENOUS
  Filled 2015-05-09: qty 5

## 2015-05-09 NOTE — Telephone Encounter (Signed)
Gave patient avs report and appointments for December  °

## 2015-05-09 NOTE — Progress Notes (Signed)
[facility] ONCOLOGY OFFICE PROGRESS NOTE   DIAGNOSIS: Metastatic colon cancer to liver and peritoneum   MOLECULAR FEATURES ([facility]):   Chief complaint: Follow-up colon cancer  CURRENT TREATMENT:  Observation  Oncology History   Colorectal cancer, stage IV, obstructing s/p colonic diversion   Staging form: Colon and Rectum, AJCC 7th Edition     Clinical: Stage IVA (TX, N0, M1a) - Signed by Concha Norway, MD on 09/15/2013       Colorectal cancer, stage IV, obstructing s/p stent x2, ostomy, then [facility] 03/06/2015   05/22/2013 - 05/27/2013 Hospital Admission A/w abdominal distention and constipation for 2 days CT scan of her abdomen and pelvis showed area of bowel wall thickening at the rectosigmoid junction concerning for malignancy.   05/22/2013 Imaging CT of Abdomen: bowel wall thickening with shouldering of the proximal and distal margins, involving therectosigmoid junction, supicous liver spleen mets. Colonsocopy recommended.    05/23/2013 Tumor Marker CEA 225.7   05/24/2013 Imaging MRI of abdomen: 3.7 x 4.4 cm enhancing lesion along the superior spleen, indeterminate but worrisome for metastasis.2.7 x 3.9 cm enhancing lesion in the medial segment left hepatic lobe,    05/25/2013 Pathology Results Diagnosis Rectum, biopsy, proximal - INVASIVE ADENOCARCINOMA.   05/25/2013 Procedure Flex sig: (Dr. Ardis Hughs). 5-6 cm obstructing rectosigmoid mass distal end 10 cm from anal verge; mass biopsed and then stented 9 cm long, 22 cm diameter uncovered SEM.    05/25/2013 Initial Diagnosis Colorectal cancer, stage IV   06/25/2013 Procedure R port a cath placement.   07/09/2013 - 08/20/2013 Chemotherapy FOLFOX q 2 weeks started.  Not elgible for clinical trial and bevacimab based on stent and high risk for perforation. She received a total of 4 cycles.    07/30/2013 Tumor Marker KRAS positive.  p53, APC identified. Mismatch Repair (MMR) preserved.    08/06/2013 Adverse Reaction Complains  of darkening of her hands with peeling.    08/27/2013 Family History She was seen by Genetic couselor.  She declined testing today, but will call and reschedule an appointment should she decide to pursue testing.   09/07/2013 Imaging CT abdomen: Mixed response to therapy. Response to therapy of hepatic metastasis. right hepatic hemangiomas are again identified. Other too small to characterize liver lesions are felt to be similar but are indeterminate.. Enlargement of a splenic lesions   09/13/2013 - 06/10/2014 Chemotherapy Second line chemo FOLFIRI for a total of 16 cycles    09/13/2013 Tumor Marker CEA 33.5   09/13/2013 Treatment Plan Change Reviewed scans consistent with mixed response.  Switch to FOLFIRI, she received a total of 16 cycles, with a few breaks due to hospitalization and chemo holiday.    10/29/2013 Adverse Reaction Patient reports intense abdominal cramping lasting the first few days on chemotherapy.  Starting Hyoscyamine 0.125 mg q 6 hours prn.    11/15/2013 - 11/15/2013 Hospital Admission Patient presented to ED with migrated stent.  Stent retrieved by Dr. Ardis Hughs.  Imaging negative for perforation.   Discussed in conference with referral to Dr. Zella Richer made.    11/23/2013 Imaging CT Chest. 1. Stable left lower lobe 5 mm pulmonary nodule. 2. Interval increase in size splenic mass is concerning formetastatic lesion.3. Interval calcification of the left hepatic lobe metastasis. Noevidence of active residual disease by CT.   07/01/2014 Imaging CT restaging scan showed stable disease, improved liver lesion.    07/16/2014 - 10/24/2014 Chemotherapy maintenance chemo with capacitain 1054m/m2 (18041m bid started, dose decresed to 150021mid on C1D8 due to  tolerance issue. Pt declined surgery. Chemo held due to hospitalization and disease progression.    08/16/2014 - 08/20/2014 Hospital Admission She was admitted for bowel obstruction from sigmoid colon mass. She declined surgery, and had sigmoid colon  stent placement. Her symptoms resolved afterwards.   10/25/2014 - 10/31/2014 Hospital Admission Admitted for bowel obstruction, CT scan showed sigmoid colon tumor growth through the stent, pt underwent Transverse loop colostomy on 10/28/2014.    11/13/2014 - 11/18/2014 Hospital Admission she was admitted for transverse loop colostomy prolapse, managed conservatively. CT chest showed RLL PE, discharged home on Xarelto    01/03/2015 - 01/07/2015 Hospital Admission palliative repair of loop colostomy prolapse with partial colectomy 01/03/15   03/05/2015 Imaging CT chest, abdomen and pelvis showed known colorectal  Them, multiple new liver metastasis,. Noticed enhancing splenic lesion continues to enlarge, nonobstructive calculus in the left renal collecting system.   03/06/2015 Surgery Laparoscopic low anterior rectosigmoid to resection, splenectomy, takedown loop colostomy, vaginal cuff peritoneal biopsy, colon polypectomyx4    03/06/2015 Pathology Results Well differentiated adenocarcinoma in rectosigmoid colon, 32 lymph nodes were negative, vaginal cuff biopsy was positive for metastatic adenocarcinoma, [place] with plasma cell infiltrate and fibrosis, no metastatic carcinoma.    INTERVAL HISTORY:  [name] [name] 64 y.o. female with a history of Stage IV colon cancer is here for follow-up.  She is clinically doing very well. She has good appetite and energy level, remains be fairly active at home and her church. She denies any pain, nausea, change of her bowel habits or other complaints. She went to beach with her husband a few weeks ago, and he really enjoyed that location. This has been the best time she has had in the past year.  Past Medical History  Diagnosis Date  . Colon cancer ([place])     dx'd 2014  . Heart murmur     ? as a child   . Pulmonary embolism ([place]) 11/2014   . Anemia     hx of     ALLERGIES:  is allergic to oxycontin and codeine.  MEDICATIONS: has a current medication list which includes  the following prescription(s): acetaminophen, cyanocobalamin, hydrocodone-acetaminophen, hydromorphone, iron, lactose free nutrition, lidocaine-prilocaine, lorazepam, ondansetron, polyethylene glycol powder, PRESCRIPTION MEDICATION, prochlorperazine, and rivaroxaban.  SURGICAL HISTORY:  Past Surgical History  Procedure Laterality Date  . Abdominal hysterectomy  2005  . Cesarean section  1976  . Flexible sigmoidoscopy N/A 05/25/2013    Procedure: FLEXIBLE SIGMOIDOSCOPY;  Surgeon: [name], MD;  Location: [facility];  Service: Endoscopy;  Laterality: N/A;  needs floro  . Colonic stent placement N/A 05/25/2013    Procedure: COLONIC STENT PLACEMENT;  Surgeon: [name], MD;  Location: [facility];  Service: Endoscopy;  Laterality: N/A;  . Portacath placement Right 06/25/2013    Procedure: ULTRA SOUND GUIDED INSERTION PORT-A-CATH;  Surgeon: [name], MD;  Location: [facility];  Service: General;  Laterality: Right;  . Flexible sigmoidoscopy N/A 08/19/2014    Procedure: FLEXIBLE SIGMOIDOSCOPY;  Surgeon: Gatha Mayer, MD;  Location: [facility];  Service: Endoscopy;  Laterality: N/A;  . Colonic stent placement N/A 08/19/2014    Procedure: COLONIC STENT PLACEMENT;  Surgeon: Gatha Mayer, MD;  Location: [facility];  Service: Endoscopy;  Laterality: N/A;  . Flexible sigmoidoscopy N/A 08/19/2014    Procedure: FLEXIBLE SIGMOIDOSCOPY;  Surgeon: Gatha Mayer, MD;  Location: [facility];  Service: Endoscopy;  Laterality: N/A;  . Colonic stent placement N/A 08/19/2014  Procedure: COLONIC STENT PLACEMENT;  Surgeon: Gatha Mayer, MD;  Location: [facility];  Service: Endoscopy;  Laterality: N/A;  . Colon resection N/A 10/28/2014    Procedure: Transverse loop colostomy;  Surgeon: [name], MD;  Location: [facility];  Service: General;  Laterality: N/A;  . Colostomy revision N/A 01/03/2015    Procedure: REVISION OF TRANSVERSE LOOP COLOSTOMY WITH PARTIAL   COLECTOMY;  Surgeon: [name], MD;  Location: [facility];  Service: General;  Laterality: N/A;  . Laparoscopic low anterior resection  03/06/2015    03/06/2015  . Laparoscopic splenectomy  03/06/2015  . Colon resection N/A 03/06/2015    Procedure: LAPAROSCOPIC takedown loop colostomy ;  Surgeon: Michael Boston, MD;  Location: [facility];  Service: General;  Laterality: N/A;  . 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 Bowel resection  03/06/2015    Procedure: LOW ANTERIOR BOWEL RESECTION, rigid proctoscopy;  Surgeon: Michael Boston, MD;  Location: [facility];  Service: General;;  . Polypectomy  03/06/2015    Procedure: transverse POLYPECTOMY x 4;  Surgeon: Michael Boston, MD;  Location: [facility];  Service: General;;    REVIEW OF SYSTEMS:   Constitutional: Denies fevers, chills or abnormal weight loss Eyes: Denies blurriness of vision Ears, nose, mouth, throat, and face: Denies mucositis or sore throat Respiratory: Denies cough, dyspnea or wheezes Cardiovascular: Denies palpitation, chest discomfort or lower extremity swelling Gastrointestinal:  Denies nausea, heartburn or change in bowel habits Skin: Denies abnormal skin rashes Lymphatics: Denies new lymphadenopathy or easy bruising Neurological:Denies numbness, tingling or new weaknesses Behavioral/Psych: Mood is stable, no new changes  All other systems were reviewed with the patient and are negative.  PHYSICAL EXAMINATION: ECOG PERFORMANCE STATUS: 1  Blood pressure 113/90, pulse 89, temperature 98.3 F (36.8 C), temperature source Oral, resp. rate 18, height 5' 5" (1.651 m), weight 149 lb (67.586 kg), SpO2 100 %.  GENERAL:alert, no distress and comfortable; well-developed, well nourished.  SKIN: skin color, texture, turgor are normal, no rashes or significant lesions; + R Port a cath without tenderness; hyperpigmentation of her hands.  EYES: normal,  Conjunctiva are pink and non-injected, sclera clear  OROPHARYNX:no exudate, no erythema and lips, buccal mucosa  NECK: supple, thyroid normal size, non-tender, without nodularity  LYMPH: no palpable lymphadenopathy in the cervical, axillary or supraclavicular  LUNGS: clear to auscultation and percussion with normal breathing effort  HEART: Tachycardic with regular rhythm and no murmurs and no lower extremity edema  ABDOMEN:abdomen soft, non-tender and normal bowel sounds.  There is a healing small shallow ulcer in the right lateral abdomen on previous ostomy site, covered by bandage. Bowel sounds normal.   Musculoskeletal:no cyanosis of digits and no clubbing  NEURO: alert & oriented x 3 with fluent speech, no focal motor/sensory deficits EXTREMITIES: no leg swollen.  Labs:  CBC Latest Ref Rng 05/09/2015 04/11/2015 03/12/2015  WBC 3.9 - 10.3 10e3/uL 6.2 6.0 7.2  Hemoglobin 11.6 - 15.9 g/dL 10.0(L) 11.2(L) 9.8(L)  Hematocrit 34.8 - 46.6 % 31.1(L) 34.9 31.0(L)  Platelets 145 - 400 10e3/uL 370 354 505(H)   CMP Latest Ref Rng 05/09/2015 04/11/2015 03/12/2015  Glucose 70 - 140 mg/dl 88 86 108(H)  BUN 7.0 - 26.0 mg/dL 13.7 13.6 7  Creatinine 0.6 - 1.1 mg/dL 0.7 0.7 0.56  Sodium 136 - 145 mEq/L 138 139 138  Potassium 3.5 - 5.1 mEq/L 4.4 3.9 4.0  Chloride 101 -  111 mmol/L - - 108  CO2 22 - 29 mEq/L _0 Calcium 8.4 - 10.4 mg/dL 11.3(H) 12.1(H) 10.8(H)  Total Protein 6.4 - 8.3 g/dL 7.2 8.0 -  Total Bilirubin 0.20 - 1.20 mg/dL <0.30 <0.30 -  Alkaline Phos 40 - 150 U/L 77 77 -  AST 5 - 34 U/L 15 18 -  ALT 0 - 55 U/L 11 11 -   PATHOLOGY REPORT Diagnosis 03/06/2015 1. Vagina, biopsy, vaginal cuff - METASTATIC ADENOCARCINOMA, SEE COMMENT. 2. Colon, polyp(s), transverse - TUBULAR ADENOMA WITH HIGH GRADE DYSPLASIA (X2), SEE COMMENT. - TUBULAR ADENOMA (X2). - NO INVASIVE CARCINOMA IDENTIFIED. 3. Colon, colostomy stoma - FINDINGS CONSISTENT WITH COLOSTOMY STOMA. - NO MALIGNANCY IDENTIFIED. 4.  Colon, segmental resection for tumor, rectosigmoid cancer - INVASIVE ADENOCARCINOMA, WELL DIFFERENTIATED, SPANNING 7.6 CM. - TUMOR INVADES THROUGH SEROSA. - RESECTION MARGINS ARE NEGATIVE. - THIRTY-TWO OF THIRTY-TWO LYMPH NODES NEGATIVE FOR CARCINOMA (0/32). - SEE ONCOLOGY TABLE. 5. Colon, resection margin (donut), proximal - BENIGN COLONIC MUCOSA. - NO DYSPLASIA OR MALIGNANCY. 6. Colon, resection margin (donut), distal - BENIGN COLONIC MUCOSA. - NO DYSPLASIA OR MALIGNANCY. 7. Spleen, possible metastasis - SPLEEN WITH PLASMA CELL INFILTRATE AND FIBROSIS, SEE COMMENT. - NO METASTATIC CARCINOMA IDENTIFIED.  Pathologic Staging: ypT4a, ypN0, ypM1b   RADIOGRAPHIC STUDIES: CT chest, abdomen and pelvis 11/13/2014 Hepatobiliary: Again noted is a large liver lesion centered in the right lobe of the liver measuring approximately 7.6 x 4.7 cm, which is similar in appearance to prior studies demonstrating early peripheral nodular enhancement with progressive centripetal filling, compatible with a cavernous hemangioma. However, today's study demonstrates a new 2.3 x 2.2 cm hypovascular lesion posterior to the cavernous hemangioma in segments 6/7 (image 57 of series 2), concerning for a new metastatic lesion. There is also a new hypovascular lesion in segment 2 (image 51 of series 2) measuring 2.5 x 2.3 cm. Several other smaller low-attenuation lesions are noted, but are too small to definitively characterize, but similar to prior examinations (potentially small cysts). No intra or extrahepatic biliary ductal dilatation. Gallbladder is normal in appearance. Partially calcified lesion in segment 2 of the liver is unchanged, and may either represent a treated metastatic lesion or a calcified granuloma. Other smaller cavernous hemangioma measuring 2.1 cm in segment 8 (image 48 of series 2) is similar to prior Examinations.  IMPRESSION: 1. Large stent in place extending from the distal sigmoid  colon into the proximal rectum traversing the known colorectal neoplasm which has significantly increased in size compared to the prior study, with greater extension into the surrounding mesorectal/mesocolic fat, with enlarging (but not technically enlarged) mesorectal lymph nodes and new liver lesions, as detailed above, compatible with new hepatic metastases. 2. Previously noted enhancing splenic lesion continues to enlarge and remains concerning for possible metastasis. 3. No definite signs of metastatic disease to the thorax at this time. 4. Nonobstructive calculi in the left renal collecting system measuring up to 8 mm in the lower pole. 5. Additional incidental findings, similar to prior studies, as above.   ASSESSMENT: [name] [name] 65 y.o. female with a history of stage IV colon adenocarcinoma, KRAS mutation(+), on capecitabine maintenance therapy..   1. 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 Now her primary rectosigmoid colon cancer has been removed, she still has multiple small liver metastasis (biopsy confirmed in December 2014), and a small peritoneal disease (positive vaginal cuff biopsy) -She has  recurrence of oil well, doing great clinically, without any symptoms. Again, she is reluctant to restart chemotherapy, due to the concern of side effects from chemotherapy. We discussed that early chemo is likely prolong her life more than wait and see, but she really appreciate her current quality of life more than quantity of life. After lengthy discussion again, we decided to continue observation for now -She previously tolerated 5/fu or capecitabine poorly. Single agent irinotecan with avastin (she never had before) would be next line therapy if she needs it in future. -Due to the K-ras mutation, she is not candidate for EGFR inhibitor therapy -Her tumor has stable MSI,  unlikely will response to immunotherapy -We discussed the option of clinical trial, she is not interested. -I'll see her back in 1 months for follow-up.  2. PE diagnosed on 11/13/2014 -Continue Xarelto. She is tolerating well, will continue   3. Anemia, secondary to chemo, and surgery  -slightly worse, hemoglobin 10 today   -She is taking ferrous sulfate, I recommend her to increase to twice daily -Continue monitoring, will repeat ferritin and iron study every 3 month, to see if she needs IV feraheme   4. 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  -She tolerated the Zometa well last months. Calcium level came down some, still elevated.  -We'll continue watch. Consider Zometa if her calcium>12    PLAN -RTC in 1 month for follow up and lab    All questions were answered. The patient knows to call the clinic with any problems, questions or concerns. We can certainly see the patient much sooner if necessary.  I spent 25 minutes counseling the patient face to face. The total time spent in the appointment was 30 minutes.  Truitt Merle  05/09/2015

## 2015-05-09 NOTE — Patient Instructions (Signed)

## 2015-05-10 ENCOUNTER — Encounter: Payer: Self-pay | Admitting: Hematology

## 2015-05-10 LAB — CEA: CEA: 23.8 ng/mL — AB (ref 0.0–5.0)

## 2015-06-20 ENCOUNTER — Other Ambulatory Visit (HOSPITAL_BASED_OUTPATIENT_CLINIC_OR_DEPARTMENT_OTHER): Payer: Medicaid Other

## 2015-06-20 ENCOUNTER — Encounter: Payer: Self-pay | Admitting: Hematology

## 2015-06-20 ENCOUNTER — Telehealth: Payer: Self-pay | Admitting: Hematology

## 2015-06-20 ENCOUNTER — Ambulatory Visit (HOSPITAL_BASED_OUTPATIENT_CLINIC_OR_DEPARTMENT_OTHER): Payer: Medicaid Other | Admitting: Hematology

## 2015-06-20 ENCOUNTER — Ambulatory Visit (HOSPITAL_BASED_OUTPATIENT_CLINIC_OR_DEPARTMENT_OTHER): Payer: Medicaid Other

## 2015-06-20 VITALS — BP 139/62 | HR 80 | Temp 97.9°F | Resp 18 | Ht 65.0 in | Wt 156.0 lb

## 2015-06-20 DIAGNOSIS — Z86711 Personal history of pulmonary embolism: Secondary | ICD-10-CM

## 2015-06-20 DIAGNOSIS — C19 Malignant neoplasm of rectosigmoid junction: Secondary | ICD-10-CM

## 2015-06-20 DIAGNOSIS — D6481 Anemia due to antineoplastic chemotherapy: Secondary | ICD-10-CM

## 2015-06-20 DIAGNOSIS — C786 Secondary malignant neoplasm of retroperitoneum and peritoneum: Secondary | ICD-10-CM

## 2015-06-20 DIAGNOSIS — C787 Secondary malignant neoplasm of liver and intrahepatic bile duct: Secondary | ICD-10-CM | POA: Diagnosis not present

## 2015-06-20 DIAGNOSIS — Z95828 Presence of other vascular implants and grafts: Secondary | ICD-10-CM

## 2015-06-20 LAB — COMPREHENSIVE METABOLIC PANEL
ALT: 10 U/L (ref 0–55)
ANION GAP: 7 meq/L (ref 3–11)
AST: 17 U/L (ref 5–34)
Albumin: 3.6 g/dL (ref 3.5–5.0)
Alkaline Phosphatase: 68 U/L (ref 40–150)
BUN: 11.5 mg/dL (ref 7.0–26.0)
CALCIUM: 11.1 mg/dL — AB (ref 8.4–10.4)
CHLORIDE: 110 meq/L — AB (ref 98–109)
CO2: 21 meq/L — AB (ref 22–29)
CREATININE: 0.8 mg/dL (ref 0.6–1.1)
EGFR: >90 mL/min/{1.73_m2} (ref >90)
Glucose: 95 mg/dl (ref 70–140)
POTASSIUM: 4.2 meq/L (ref 3.5–5.1)
Sodium: 137 mEq/L (ref 136–145)
Total Bilirubin: <0.3 mg/dL (ref 0.20–1.20)
Total Protein: 7.6 g/dL (ref 6.4–8.3)

## 2015-06-20 LAB — CBC & DIFF AND RETIC
BASO%: 0.4 % (ref 0.0–2.0)
Basophils Absolute: 0 10*3/uL (ref 0.0–0.1)
EOS%: 3 % (ref 0.0–7.0)
Eosinophils Absolute: 0.1 10*3/uL (ref 0.0–0.5)
HEMATOCRIT: 30.6 % — AB (ref 34.8–46.6)
HGB: 9.8 g/dL — ABNORMAL LOW (ref 11.6–15.9)
Immature Retic Fract: 2.4 % (ref 1.60–10.00)
LYMPH#: 1.4 10*3/uL (ref 0.9–3.3)
LYMPH%: 29.2 % (ref 14.0–49.7)
MCH: 27.1 pg (ref 25.1–34.0)
MCHC: 32 g/dL (ref 31.5–36.0)
MCV: 84.5 fL (ref 79.5–101.0)
MONO#: 0.3 10*3/uL (ref 0.1–0.9)
MONO%: 6.1 % (ref 0.0–14.0)
NEUT#: 2.9 10*3/uL (ref 1.5–6.5)
NEUT%: 61.3 % (ref 38.4–76.8)
Platelets: 384 10*3/uL (ref 145–400)
RBC: 3.62 10*6/uL — AB (ref 3.70–5.45)
RDW: 16 % — AB (ref 11.2–14.5)
RETIC %: 1.07 % (ref 0.70–2.10)
RETIC CT ABS: 38.73 10*3/uL (ref 33.70–90.70)
WBC: 4.7 10*3/uL (ref 3.9–10.3)

## 2015-06-20 MED ORDER — RIVAROXABAN 20 MG PO TABS: 20.0000 mg | ORAL_TABLET | Freq: Every day | ORAL | Status: DC

## 2015-06-20 MED ORDER — SODIUM CHLORIDE 0.9 % IJ SOLN
10.0000 mL | INTRAMUSCULAR | Status: DC | PRN
  Administered 2015-06-20: 10 mL via INTRAVENOUS
  Filled 2015-06-20: qty 10

## 2015-06-20 MED ORDER — HEPARIN SOD (PORK) LOCK FLUSH 100 UNIT/ML IV SOLN
500.0000 [IU] | Freq: Once | INTRAVENOUS | Status: AC
  Administered 2015-06-20: 500 [IU] via INTRAVENOUS
  Filled 2015-06-20: qty 5

## 2015-06-20 NOTE — Progress Notes (Signed)
[place] ONCOLOGY OFFICE PROGRESS NOTE   DIAGNOSIS: Metastatic colon cancer to liver and peritoneum   MOLECULAR FEATURES ([facility]):   Chief complaint: Follow-up colon cancer  CURRENT TREATMENT:  Observation  Oncology History   Colorectal cancer, stage IV, obstructing s/p colonic diversion   Staging form: Colon and Rectum, AJCC 7th Edition     Clinical: Stage IVA (TX, N0, M1a) - Signed by Concha Norway, MD on 09/15/2013       Colorectal cancer, stage IV, obstructing s/p stent x2, ostomy, then [facility] 03/06/2015   05/22/2013 - 05/27/2013 Hospital Admission A/w abdominal distention and constipation for 2 days CT scan of her abdomen and pelvis showed area of bowel wall thickening at the rectosigmoid junction concerning for malignancy.   05/22/2013 Imaging CT of Abdomen: bowel wall thickening with shouldering of the proximal and distal margins, involving therectosigmoid junction, supicous liver spleen mets. Colonsocopy recommended.    05/23/2013 Tumor Marker CEA 225.7   05/24/2013 Imaging MRI of abdomen: 3.7 x 4.4 cm enhancing lesion along the superior spleen, indeterminate but worrisome for metastasis.2.7 x 3.9 cm enhancing lesion in the medial segment left hepatic lobe,    05/25/2013 Pathology Results Diagnosis Rectum, biopsy, proximal - INVASIVE ADENOCARCINOMA.   05/25/2013 Procedure Flex sig: (Dr. Ardis Hughs). 5-6 cm obstructing rectosigmoid mass distal end 10 cm from anal verge; mass biopsed and then stented 9 cm long, 22 cm diameter uncovered SEM.    05/25/2013 Initial Diagnosis Colorectal cancer, stage IV   06/25/2013 Procedure R port a cath placement.   07/09/2013 - 08/20/2013 Chemotherapy FOLFOX q 2 weeks started.  Not elgible for clinical trial and bevacimab based on stent and high risk for perforation. She received a total of 4 cycles.    07/30/2013 Tumor Marker KRAS positive.  p53, APC identified. Mismatch Repair (MMR) preserved.    08/06/2013 Adverse Reaction Complains  of darkening of her hands with peeling.    08/27/2013 Family History She was seen by Genetic couselor.  She declined testing today, but will call and reschedule an appointment should she decide to pursue testing.   09/07/2013 Imaging CT abdomen: Mixed response to therapy. Response to therapy of hepatic metastasis. right hepatic hemangiomas are again identified. Other too small to characterize liver lesions are felt to be similar but are indeterminate.. Enlargement of a splenic lesions   09/13/2013 - 06/10/2014 Chemotherapy Second line chemo FOLFIRI for a total of 16 cycles    09/13/2013 Tumor Marker CEA 33.5   09/13/2013 Treatment Plan Change Reviewed scans consistent with mixed response.  Switch to FOLFIRI, she received a total of 16 cycles, with a few breaks due to hospitalization and chemo holiday.    10/29/2013 Adverse Reaction Patient reports intense abdominal cramping lasting the first few days on chemotherapy.  Starting Hyoscyamine 0.125 mg q 6 hours prn.    11/15/2013 - 11/15/2013 Hospital Admission Patient presented to ED with migrated stent.  Stent retrieved by Dr. Ardis Hughs.  Imaging negative for perforation.   Discussed in conference with referral to Dr. Zella Richer made.    11/23/2013 Imaging CT Chest. 1. Stable left lower lobe 5 mm pulmonary nodule. 2. Interval increase in size splenic mass is concerning formetastatic lesion.3. Interval calcification of the left hepatic lobe metastasis. Noevidence of active residual disease by CT.   07/01/2014 Imaging CT restaging scan showed stable disease, improved liver lesion.    07/16/2014 - 10/24/2014 Chemotherapy maintenance chemo with capacitain 1054m/m2 (18041m bid started, dose decresed to 150021mid on C1D8 due to  tolerance issue. Pt declined surgery. Chemo held due to hospitalization and disease progression.    08/16/2014 - 08/20/2014 Hospital Admission She was admitted for bowel obstruction from sigmoid colon mass. She declined surgery, and had sigmoid colon  stent placement. Her symptoms resolved afterwards.   10/25/2014 - 10/31/2014 Hospital Admission Admitted for bowel obstruction, CT scan showed sigmoid colon tumor growth through the stent, pt underwent Transverse loop colostomy on 10/28/2014.    11/13/2014 - 11/18/2014 Hospital Admission she was admitted for transverse loop colostomy prolapse, managed conservatively. CT chest showed RLL PE, discharged home on Xarelto    01/03/2015 - 01/07/2015 Hospital Admission palliative repair of loop colostomy prolapse with partial colectomy 01/03/15   03/05/2015 Imaging CT chest, abdomen and pelvis showed known colorectal  Them, multiple new liver metastasis,. Noticed enhancing splenic lesion continues to enlarge, nonobstructive calculus in the left renal collecting system.   03/06/2015 Surgery Laparoscopic low anterior rectosigmoid to resection, splenectomy, takedown loop colostomy, vaginal cuff peritoneal biopsy, colon polypectomyx4    03/06/2015 Pathology Results Well differentiated adenocarcinoma in rectosigmoid colon, 32 lymph nodes were negative, vaginal cuff biopsy was positive for metastatic adenocarcinoma, [place] with plasma cell infiltrate and fibrosis, no metastatic carcinoma.   CURRENT THERAPY: Observation  INTERVAL HISTORY:  [name] [name] 64 y.o. female with a history of Stage IV colon cancer is here for follow-up.  She is doing very well. She has great appetite and energy level, very active at home and her church. She gained 7 pounds in the past one month. She denies any pain, nausea, bloating, or other symptoms. Her bowel movement is normal.  Past Medical History  Diagnosis Date  . Colon cancer ([place])     dx'd 2014  . Heart murmur     ? as a child   . Pulmonary embolism ([place]) 11/2014   . Anemia     hx of     ALLERGIES:  is allergic to oxycontin and codeine.  MEDICATIONS: has a current medication list which includes the following prescription(s): acetaminophen, cyanocobalamin, iron,  lidocaine-prilocaine, polyethylene glycol powder, and rivaroxaban, and the following Facility-Administered Medications: sodium chloride.  SURGICAL HISTORY:  Past Surgical History  Procedure Laterality Date  . Abdominal hysterectomy  2005  . Cesarean section  1976  . Flexible sigmoidoscopy N/A 05/25/2013    Procedure: FLEXIBLE SIGMOIDOSCOPY;  Surgeon: [name], MD;  Location: [facility];  Service: Endoscopy;  Laterality: N/A;  needs floro  . Colonic stent placement N/A 05/25/2013    Procedure: COLONIC STENT PLACEMENT;  Surgeon: [name], MD;  Location: [facility];  Service: Endoscopy;  Laterality: N/A;  . Portacath placement Right 06/25/2013    Procedure: ULTRA SOUND GUIDED INSERTION PORT-A-CATH;  Surgeon: [name], MD;  Location: [facility];  Service: General;  Laterality: Right;  . Flexible sigmoidoscopy N/A 08/19/2014    Procedure: FLEXIBLE SIGMOIDOSCOPY;  Surgeon: Gatha Mayer, MD;  Location: [facility];  Service: Endoscopy;  Laterality: N/A;  . Colonic stent placement N/A 08/19/2014    Procedure: COLONIC STENT PLACEMENT;  Surgeon: Gatha Mayer, MD;  Location: [facility];  Service: Endoscopy;  Laterality: N/A;  . Flexible sigmoidoscopy N/A 08/19/2014    Procedure: FLEXIBLE SIGMOIDOSCOPY;  Surgeon: Gatha Mayer, MD;  Location: [facility];  Service: Endoscopy;  Laterality: N/A;  . Colonic stent placement N/A 08/19/2014    Procedure: COLONIC STENT PLACEMENT;  Surgeon: Gatha Mayer, MD;  Location: [facility];  Service: Endoscopy;  Laterality: N/A;  . Colon  resection N/A 10/28/2014    Procedure: Transverse loop colostomy;  Surgeon: [name], MD;  Location: [facility];  Service: General;  Laterality: N/A;  . Colostomy revision N/A 01/03/2015    Procedure: REVISION OF TRANSVERSE LOOP COLOSTOMY WITH PARTIAL  COLECTOMY;  Surgeon: [name], MD;  Location: [facility];  Service: General;  Laterality: N/A;  . Laparoscopic low anterior resection   03/06/2015    03/06/2015  . Laparoscopic splenectomy  03/06/2015  . Colon resection N/A 03/06/2015    Procedure: LAPAROSCOPIC takedown loop colostomy ;  Surgeon: Michael Boston, MD;  Location: [facility];  Service: General;  Laterality: N/A;  . 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 Bowel resection  03/06/2015    Procedure: LOW ANTERIOR BOWEL RESECTION, rigid proctoscopy;  Surgeon: Michael Boston, MD;  Location: [facility];  Service: General;;  . Polypectomy  03/06/2015    Procedure: transverse POLYPECTOMY x 4;  Surgeon: Michael Boston, MD;  Location: [facility];  Service: General;;    REVIEW OF SYSTEMS:   Constitutional: Denies fevers, chills or abnormal weight loss Eyes: Denies blurriness of vision Ears, nose, mouth, throat, and face: Denies mucositis or sore throat Respiratory: Denies cough, dyspnea or wheezes Cardiovascular: Denies palpitation, chest discomfort or lower extremity swelling Gastrointestinal:  Denies nausea, heartburn or change in bowel habits Skin: Denies abnormal skin rashes Lymphatics: Denies new lymphadenopathy or easy bruising Neurological:Denies numbness, tingling or new weaknesses Behavioral/Psych: Mood is stable, no new changes  All other systems were reviewed with the patient and are negative.  PHYSICAL EXAMINATION: ECOG PERFORMANCE STATUS: 0  Blood pressure 139/62, pulse 80, temperature 97.9 F (36.6 C), temperature source Oral, resp. rate 18, height '5\' 5"'$  (1.651 m), weight 156 lb (70.761 kg), SpO2 100 %.  GENERAL:alert, no distress and comfortable; well-developed, well nourished.  SKIN: skin color, texture, turgor are normal, no rashes or significant lesions; + R Port, hyperpigmentation of her hands.  EYES: normal, Conjunctiva are pink and non-injected, sclera clear  OROPHARYNX:no exudate, no erythema and lips, buccal mucosa  NECK: supple, thyroid normal size, non-tender,  without nodularity  LYMPH: no palpable lymphadenopathy in the cervical, axillary or supraclavicular  LUNGS: clear to auscultation and percussion with normal breathing effort  HEART: Tachycardic with regular rhythm and no murmurs and no lower extremity edema  ABDOMEN:abdomen soft, non-tender and normal bowel sounds. Well-healed surgical scar in the right upper quadrant.  Bowel sounds normal.   Musculoskeletal:no cyanosis of digits and no clubbing  NEURO: alert & oriented x 3 with fluent speech, no focal motor/sensory deficits EXTREMITIES: no leg swollen.  Labs:  CBC Latest Ref Rng 06/20/2015 05/09/2015 04/11/2015  WBC 3.9 - 10.3 10e3/uL 4.7 6.2 6.0  Hemoglobin 11.6 - 15.9 g/dL 9.8(L) 10.0(L) 11.2(L)  Hematocrit 34.8 - 46.6 % 30.6(L) 31.1(L) 34.9  Platelets 145 - 400 10e3/uL 384 370 354   CMP Latest Ref Rng 06/20/2015 05/09/2015 04/11/2015  Glucose 70 - 140 mg/dl 95 88 86  BUN 7.0 - 26.0 mg/dL 11.5 13.7 13.6  Creatinine 0.6 - 1.1 mg/dL 0.8 0.7 0.7  Sodium 136 - 145 mEq/L 137 138 139  Potassium 3.5 - 5.1 mEq/L 4.2 4.4 3.9  Chloride 101 - 111 mmol/L - - -  CO2 22 - 29 mEq/L 21(L) 23 25  Calcium 8.4 - 10.4 mg/dL 11.1(H) 11.3(H) 12.1(H)  Total Protein 6.4 - 8.3 g/dL 7.6 7.2 8.0  Total Bilirubin 0.20 -  1.20 mg/dL <0.30 <0.30 <0.30  Alkaline Phos 40 - 150 U/L 68 77 77  AST 5 - 34 U/L '17 15 18  '$ ALT 0 - 55 U/L '10 11 11   '$ CEA  Status: Finalresult Visible to patient:  Not Released Nextappt: Today at 10:15 AM in [facility] 3) Dx:  Metastatic colon cancer to liver ([place])              Ref Range 40moago  225mogo  38m85moo     CEA 0.0 - 5.0 ng/mL 23.8 (H) 15.1 (H) 10.9 (H)         PATHOLOGY REPORT Diagnosis 03/06/2015 1. Vagina, biopsy, vaginal cuff - METASTATIC ADENOCARCINOMA, SEE COMMENT. 2. Colon, polyp(s), transverse - TUBULAR ADENOMA WITH HIGH GRADE DYSPLASIA (X2), SEE COMMENT. - TUBULAR ADENOMA (X2). - NO INVASIVE CARCINOMA IDENTIFIED. 3. Colon,  colostomy stoma - FINDINGS CONSISTENT WITH COLOSTOMY STOMA. - NO MALIGNANCY IDENTIFIED. 4. Colon, segmental resection for tumor, rectosigmoid cancer - INVASIVE ADENOCARCINOMA, WELL DIFFERENTIATED, SPANNING 7.6 CM. - TUMOR INVADES THROUGH SEROSA. - RESECTION MARGINS ARE NEGATIVE. - THIRTY-TWO OF THIRTY-TWO LYMPH NODES NEGATIVE FOR CARCINOMA (0/32). - SEE ONCOLOGY TABLE. 5. Colon, resection margin (donut), proximal - BENIGN COLONIC MUCOSA. - NO DYSPLASIA OR MALIGNANCY. 6. Colon, resection margin (donut), distal - BENIGN COLONIC MUCOSA. - NO DYSPLASIA OR MALIGNANCY. 7. Spleen, possible metastasis - SPLEEN WITH PLASMA CELL INFILTRATE AND FIBROSIS, SEE COMMENT. - NO METASTATIC CARCINOMA IDENTIFIED.  Pathologic Staging: ypT4a, ypN0, ypM1b   RADIOGRAPHIC STUDIES: CT chest, abdomen and pelvis 11/13/2014 Hepatobiliary: Again noted is a large liver lesion centered in the right lobe of the liver measuring approximately 7.6 x 4.7 cm, which is similar in appearance to prior studies demonstrating early peripheral nodular enhancement with progressive centripetal filling, compatible with a cavernous hemangioma. However, today's study demonstrates a new 2.3 x 2.2 cm hypovascular lesion posterior to the cavernous hemangioma in segments 6/7 (image 57 of series 2), concerning for a new metastatic lesion. There is also a new hypovascular lesion in segment 2 (image 51 of series 2) measuring 2.5 x 2.3 cm. Several other smaller low-attenuation lesions are noted, but are too small to definitively characterize, but similar to prior examinations (potentially small cysts). No intra or extrahepatic biliary ductal dilatation. Gallbladder is normal in appearance. Partially calcified lesion in segment 2 of the liver is unchanged, and may either represent a treated metastatic lesion or a calcified granuloma. Other smaller cavernous hemangioma measuring 2.1 cm in segment 8 (image 48 of series 2) is similar to  prior Examinations.  IMPRESSION: 1. Large stent in place extending from the distal sigmoid colon into the proximal rectum traversing the known colorectal neoplasm which has significantly increased in size compared to the prior study, with greater extension into the surrounding mesorectal/mesocolic fat, with enlarging (but not technically enlarged) mesorectal lymph nodes and new liver lesions, as detailed above, compatible with new hepatic metastases. 2. Previously noted enhancing splenic lesion continues to enlarge and remains concerning for possible metastasis. 3. No definite signs of metastatic disease to the thorax at this time. 4. Nonobstructive calculi in the left renal collecting system measuring up to 8 mm in the lower pole. 5. Additional incidental findings, similar to prior studies, as above.   ASSESSMENT: CarFRITZI SCRIPTER 71o. female with a history of stage IV colon adenocarcinoma, KRAS mutation(+), on capecitabine maintenance therapy..   1. Metastatic colorectal Cancer (mCRC) to liver, spleen, KRAS mutation (+), MSI stable -I discussed her surgical pathology  results in great details. Now her primary rectosigmoid colon cancer has been removed, she still has multiple small liver metastasis (biopsy confirmed in December 2014), and a small peritoneal disease (positive vaginal cuff biopsy) -She has recurrence of oil well, doing great clinically, without any symptoms. Again, she is reluctant to restart chemotherapy, due to the concern of side effects from chemotherapy. We discussed that early chemo is likely prolong her life more than wait and see, but she really appreciate her current quality of life more than quantity of life. After lengthy discussion again, we decided to continue observation for now -She previously tolerated 5/fu or capecitabine poorly. Single agent irinotecan with avastin (she never had before) would be next line therapy if she needs it in future. -Due to the  K-ras mutation, she is not candidate for EGFR inhibitor therapy -Her tumor has stable MSI, unlikely will response to immunotherapy -We discussed the option of clinical trial, she is not interested. -She is clinically doing very well, we'll continue observation for now -I'll see her back in 6 weeks for follow-up.  2. PE diagnosed on 11/13/2014 -Continue Xarelto. She is tolerating well, will continue, I refilled for her today   3. Anemia, secondary to chemo, and surgery  -stable, hemoglobin 9.8 today   -continue ferrous sulfate -Continue monitoring, will repeat ferritin and iron study every 3 month, to see if she needs IV feraheme   4. 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  -She tolerated the Zometa last time. Calcium level came down some, stable, 11.1 today -We'll continue watch. Consider Zometa if her calcium>12    PLAN -RTC in 6 weeks follow up, port flush and lab    All questions were answered. The patient knows to call the clinic with any problems, questions or concerns. We can certainly see the patient much sooner if necessary.  I spent 25 minutes counseling the patient face to face. The total time spent in the appointment was 30 minutes.  Truitt Merle  06/20/2015

## 2015-06-20 NOTE — Telephone Encounter (Signed)
Gave and printed appt sched and avs for pt for Jan 2017 °

## 2015-06-20 NOTE — Patient Instructions (Signed)

## 2015-06-21 LAB — CEA: CEA: 49.4 ng/mL — ABNORMAL HIGH (ref 0.0–5.0)

## 2015-07-07 IMAGING — CR DG CHEST 2V
2 series · 2 of 2 positions shown · non-contrast
Comparison: 11/13/2014

CLINICAL DATA: Preop colostomy revision.

EXAM:
CHEST  2 VIEW

[w chest pa]
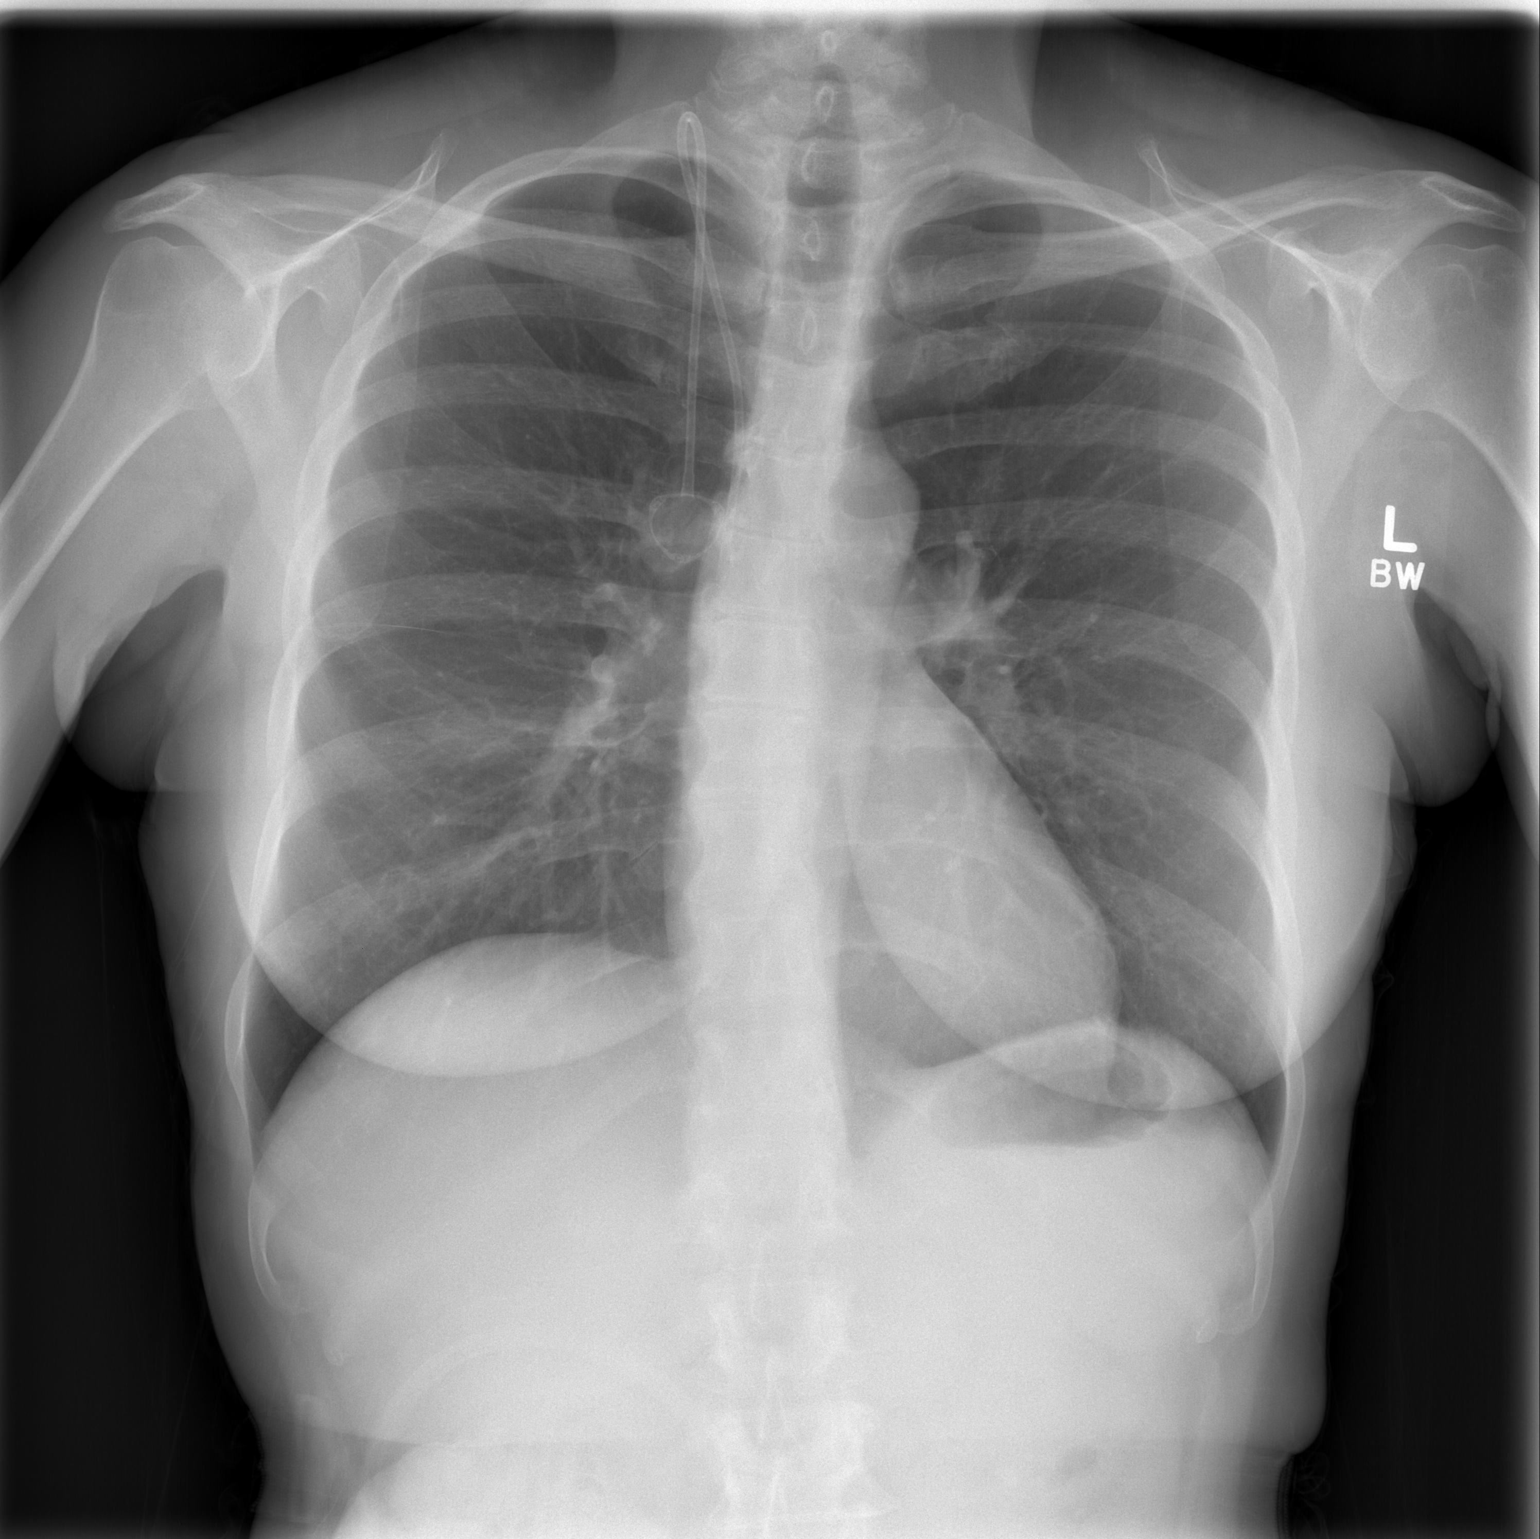

[w chest lat]
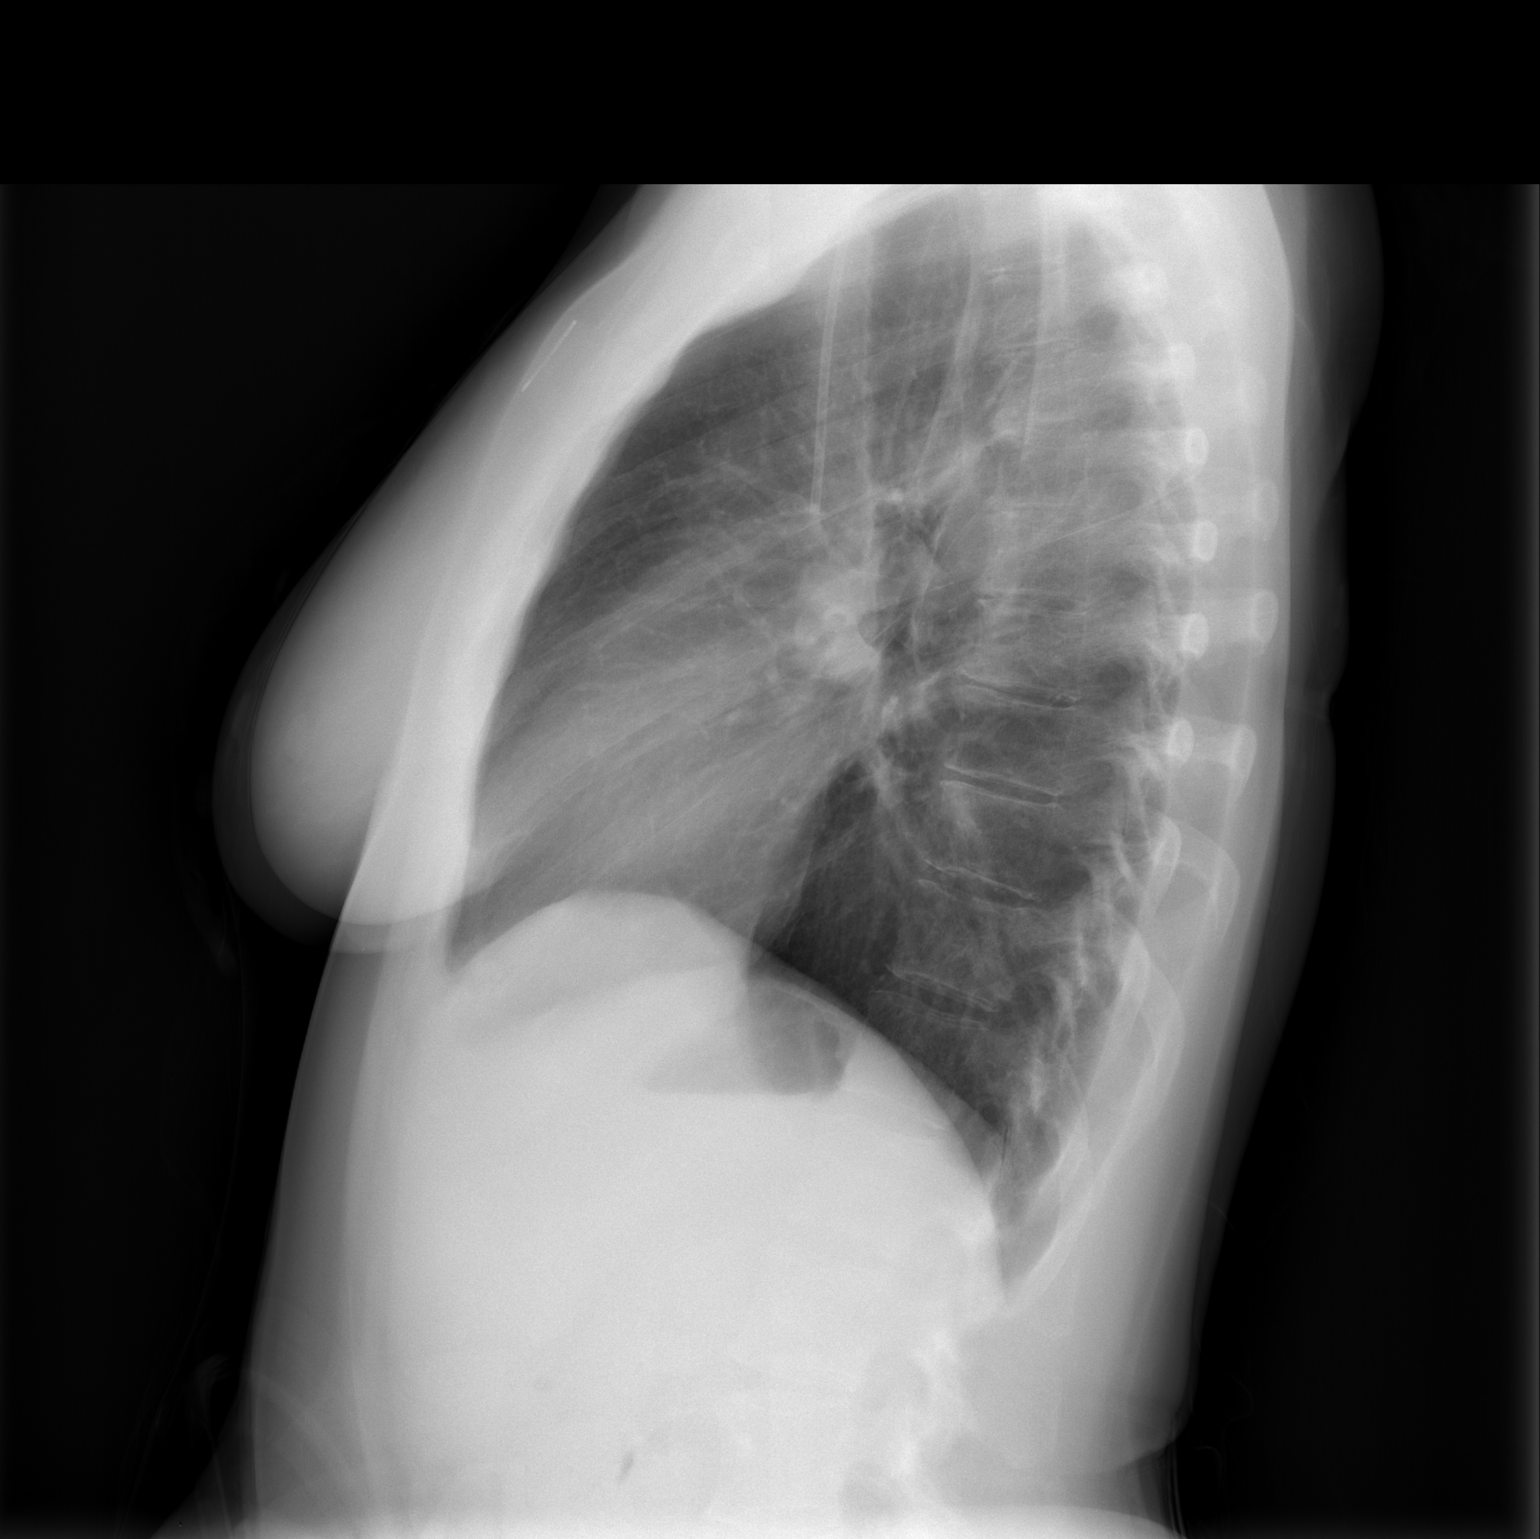

[2 of 2 positions shown; findings below may reference images not displayed]

FINDINGS: Right Port-A-Cath remains in place, unchanged. Heart and mediastinal
contours are within normal limits. No focal opacities or effusions.
No acute bony abnormality.
IMPRESSION: No active cardiopulmonary disease.

## 2015-08-01 ENCOUNTER — Encounter: Payer: Self-pay | Admitting: Hematology

## 2015-08-01 ENCOUNTER — Ambulatory Visit (HOSPITAL_BASED_OUTPATIENT_CLINIC_OR_DEPARTMENT_OTHER): Payer: Medicaid Other | Admitting: Hematology

## 2015-08-01 ENCOUNTER — Ambulatory Visit (HOSPITAL_BASED_OUTPATIENT_CLINIC_OR_DEPARTMENT_OTHER): Payer: Medicaid Other

## 2015-08-01 ENCOUNTER — Other Ambulatory Visit (HOSPITAL_BASED_OUTPATIENT_CLINIC_OR_DEPARTMENT_OTHER): Payer: Medicaid Other

## 2015-08-01 ENCOUNTER — Telehealth: Payer: Self-pay | Admitting: Hematology

## 2015-08-01 VITALS — BP 126/62 | HR 73 | Temp 98.3°F | Resp 18 | Wt 155.4 lb

## 2015-08-01 DIAGNOSIS — Z86711 Personal history of pulmonary embolism: Secondary | ICD-10-CM

## 2015-08-01 DIAGNOSIS — Z95828 Presence of other vascular implants and grafts: Secondary | ICD-10-CM

## 2015-08-01 DIAGNOSIS — D500 Iron deficiency anemia secondary to blood loss (chronic): Secondary | ICD-10-CM

## 2015-08-01 DIAGNOSIS — C786 Secondary malignant neoplasm of retroperitoneum and peritoneum: Secondary | ICD-10-CM

## 2015-08-01 DIAGNOSIS — D6481 Anemia due to antineoplastic chemotherapy: Secondary | ICD-10-CM

## 2015-08-01 DIAGNOSIS — C19 Malignant neoplasm of rectosigmoid junction: Secondary | ICD-10-CM

## 2015-08-01 DIAGNOSIS — C787 Secondary malignant neoplasm of liver and intrahepatic bile duct: Secondary | ICD-10-CM | POA: Diagnosis not present

## 2015-08-01 LAB — COMPREHENSIVE METABOLIC PANEL
ALT: 12 U/L (ref 0–55)
AST: 18 U/L (ref 5–34)
Albumin: 3.7 g/dL (ref 3.5–5.0)
Alkaline Phosphatase: 64 U/L (ref 40–150)
Anion Gap: 7 mEq/L (ref 3–11)
BUN: 12.2 mg/dL (ref 7.0–26.0)
CHLORIDE: 108 meq/L (ref 98–109)
CO2: 23 meq/L (ref 22–29)
CREATININE: 0.8 mg/dL (ref 0.6–1.1)
Calcium: 11.3 mg/dL — ABNORMAL HIGH (ref 8.4–10.4)
EGFR: >90 mL/min/{1.73_m2} (ref >90)
Glucose: 96 mg/dl (ref 70–140)
POTASSIUM: 4.2 meq/L (ref 3.5–5.1)
Sodium: 137 mEq/L (ref 136–145)
Total Bilirubin: <0.3 mg/dL (ref 0.20–1.20)
Total Protein: 7.5 g/dL (ref 6.4–8.3)

## 2015-08-01 LAB — CBC & DIFF AND RETIC
BASO%: 0.2 % (ref 0.0–2.0)
Basophils Absolute: 0 10*3/uL (ref 0.0–0.1)
EOS%: 4.1 % (ref 0.0–7.0)
Eosinophils Absolute: 0.2 10*3/uL (ref 0.0–0.5)
HCT: 32.4 % — ABNORMAL LOW (ref 34.8–46.6)
HGB: 10.6 g/dL — ABNORMAL LOW (ref 11.6–15.9)
Immature Retic Fract: 1.3 % — ABNORMAL LOW (ref 1.60–10.00)
LYMPH#: 1.5 10*3/uL (ref 0.9–3.3)
LYMPH%: 34.9 % (ref 14.0–49.7)
MCH: 27.5 pg (ref 25.1–34.0)
MCHC: 32.7 g/dL (ref 31.5–36.0)
MCV: 84.2 fL (ref 79.5–101.0)
MONO#: 0.4 10*3/uL (ref 0.1–0.9)
MONO%: 8 % (ref 0.0–14.0)
NEUT%: 52.8 % (ref 38.4–76.8)
NEUTROS ABS: 2.3 10*3/uL (ref 1.5–6.5)
Platelets: 369 10*3/uL (ref 145–400)
RBC: 3.85 10*6/uL (ref 3.70–5.45)
RDW: 13.9 % (ref 11.2–14.5)
RETIC %: 0.71 % (ref 0.70–2.10)
RETIC CT ABS: 27.34 10*3/uL — AB (ref 33.70–90.70)
WBC: 4.4 10*3/uL (ref 3.9–10.3)

## 2015-08-01 LAB — IRON AND TIBC
%SAT: 18 % — ABNORMAL LOW (ref 21–57)
IRON: 63 ug/dL (ref 41–142)
TIBC: 350 ug/dL (ref 236–444)
UIBC: 287 ug/dL (ref 120–384)

## 2015-08-01 LAB — FERRITIN: FERRITIN: 15 ng/mL (ref 9–269)

## 2015-08-01 MED ORDER — HEPARIN SOD (PORK) LOCK FLUSH 100 UNIT/ML IV SOLN
500.0000 [IU] | Freq: Once | INTRAVENOUS | Status: AC
Start: 2015-08-01 — End: 2015-08-01
  Administered 2015-08-01: 500 [IU] via INTRAVENOUS
  Filled 2015-08-01: qty 5

## 2015-08-01 MED ORDER — SODIUM CHLORIDE 0.9% FLUSH
10.0000 mL | INTRAVENOUS | Status: DC | PRN
  Administered 2015-08-01: 10 mL via INTRAVENOUS
  Filled 2015-08-01: qty 10

## 2015-08-01 NOTE — Progress Notes (Signed)
[facility] ONCOLOGY OFFICE PROGRESS NOTE   DIAGNOSIS: Metastatic colon cancer to liver and peritoneum  Chief complaint: Follow-up colon cancer  CURRENT TREATMENT:  Observation  Oncology History   Colorectal cancer, stage IV, obstructing s/p colonic diversion   Staging form: Colon and Rectum, AJCC 7th Edition     Clinical: Stage IVA (TX, N0, M1a) - Signed by Concha Norway, MD on 09/15/2013       Colorectal cancer, stage IV, obstructing s/p stent x2, ostomy, then [facility] 03/06/2015   05/22/2013 - 05/27/2013 Hospital Admission A/w abdominal distention and constipation for 2 days CT scan of her abdomen and pelvis showed area of bowel wall thickening at the rectosigmoid junction concerning for malignancy.   05/22/2013 Imaging CT of Abdomen: bowel wall thickening with shouldering of the proximal and distal margins, involving therectosigmoid junction, supicous liver spleen mets. Colonsocopy recommended.    05/23/2013 Tumor Marker CEA 225.7   05/24/2013 Imaging MRI of abdomen: 3.7 x 4.4 cm enhancing lesion along the superior spleen, indeterminate but worrisome for metastasis.2.7 x 3.9 cm enhancing lesion in the medial segment left hepatic lobe,    05/25/2013 Pathology Results Diagnosis Rectum, biopsy, proximal - INVASIVE ADENOCARCINOMA.   05/25/2013 Procedure Flex sig: (Dr. Ardis Hughs). 5-6 cm obstructing rectosigmoid mass distal end 10 cm from anal verge; mass biopsed and then stented 9 cm long, 22 cm diameter uncovered SEM.    05/25/2013 Initial Diagnosis Colorectal cancer, stage IV   06/25/2013 Procedure R port a cath placement.   07/09/2013 - 08/20/2013 Chemotherapy FOLFOX q 2 weeks started.  Not elgible for clinical trial and bevacimab based on stent and high risk for perforation. She received a total of 4 cycles.    07/30/2013 Tumor Marker KRAS positive.  p53, APC identified. Mismatch Repair (MMR) preserved.    08/06/2013 Adverse Reaction Complains of darkening of her hands with peeling.    08/27/2013 Family History She was seen by Genetic couselor.  She declined testing today, but will call and reschedule an appointment should she decide to pursue testing.   09/07/2013 Imaging CT abdomen: Mixed response to therapy. Response to therapy of hepatic metastasis. right hepatic hemangiomas are again identified. Other too small to characterize liver lesions are felt to be similar but are indeterminate.. Enlargement of a splenic lesions   09/13/2013 - 06/10/2014 Chemotherapy Second line chemo FOLFIRI for a total of 16 cycles    09/13/2013 Tumor Marker CEA 33.5   09/13/2013 Treatment Plan Change Reviewed scans consistent with mixed response.  Switch to FOLFIRI, she received a total of 16 cycles, with a few breaks due to hospitalization and chemo holiday.    10/29/2013 Adverse Reaction Patient reports intense abdominal cramping lasting the first few days on chemotherapy.  Starting Hyoscyamine 0.125 mg q 6 hours prn.    11/15/2013 - 11/15/2013 Hospital Admission Patient presented to ED with migrated stent.  Stent retrieved by Dr. Ardis Hughs.  Imaging negative for perforation.   Discussed in conference with referral to Dr. Zella Richer made.    11/23/2013 Imaging CT Chest. 1. Stable left lower lobe 5 mm pulmonary nodule. 2. Interval increase in size splenic mass is concerning formetastatic lesion.3. Interval calcification of the left hepatic lobe metastasis. Noevidence of active residual disease by CT.   07/01/2014 Imaging CT restaging scan showed stable disease, improved liver lesion.    07/16/2014 - 10/24/2014 Chemotherapy maintenance chemo with capacitain 1078m/m2 (18045m bid started, dose decresed to 150021mid on C1D8 due to tolerance issue. Pt declined surgery. Chemo held  due to hospitalization and disease progression.    08/16/2014 - 08/20/2014 Hospital Admission She was admitted for bowel obstruction from sigmoid colon mass. She declined surgery, and had sigmoid colon stent placement. Her symptoms resolved  afterwards.   10/25/2014 - 10/31/2014 Hospital Admission Admitted for bowel obstruction, CT scan showed sigmoid colon tumor growth through the stent, pt underwent Transverse loop colostomy on 10/28/2014.    11/13/2014 - 11/18/2014 Hospital Admission she was admitted for transverse loop colostomy prolapse, managed conservatively. CT chest showed RLL PE, discharged home on Xarelto    01/03/2015 - 01/07/2015 Hospital Admission palliative repair of loop colostomy prolapse with partial colectomy 01/03/15   03/05/2015 Imaging CT chest, abdomen and pelvis showed known colorectal  Them, multiple new liver metastasis,. Noticed enhancing splenic lesion continues to enlarge, nonobstructive calculus in the left renal collecting system.   03/06/2015 Surgery Laparoscopic low anterior rectosigmoid to resection, splenectomy, takedown loop colostomy, vaginal cuff peritoneal biopsy, colon polypectomyx4    03/06/2015 Pathology Results Well differentiated adenocarcinoma in rectosigmoid colon, 32 lymph nodes were negative, vaginal cuff biopsy was positive for metastatic adenocarcinoma, [place] with plasma cell infiltrate and fibrosis, no metastatic carcinoma.   CURRENT THERAPY: Observation  INTERVAL HISTORY:  [name] [name] 65 y.o. female with a history of Stage IV colon cancer is here for follow-up.  She is doing very well. She is very satisfied so we how she feels. She denies any pain, dyspnea, GI symptoms or other symptoms. She has great appetite and energy level, remain to be active at home and her church. She really enjoys the quality of life she has now, is not afraid that she will eventually die from her colon cancer. She is not interested in treatment for now. She strongly believes that the God is supporting and guiding her.   Past Medical History  Diagnosis Date  . Colon cancer (HCC)     dx'd 2014  . Heart murmur     ? as a child   . Pulmonary embolism (HCC) 11/2014   . Anemia     hx of     ALLERGIES:  is allergic to  oxycontin and codeine.  MEDICATIONS: has a current medication list which includes the following prescription(s): acetaminophen, cyanocobalamin, iron, lidocaine-prilocaine, polyethylene glycol powder, and rivaroxaban.  SURGICAL HISTORY:  Past Surgical History  Procedure Laterality Date  . Abdominal hysterectomy  2005  . Cesarean section  1976  . Flexible sigmoidoscopy N/A 05/25/2013    Procedure: FLEXIBLE SIGMOIDOSCOPY;  Surgeon: Rachael Fee, MD;  Location: [facility];  Service: Endoscopy;  Laterality: N/A;  needs floro  . Colonic stent placement N/A 05/25/2013    Procedure: COLONIC STENT PLACEMENT;  Surgeon: Rachael Fee, MD;  Location: [facility];  Service: Endoscopy;  Laterality: N/A;  . Portacath placement Right 06/25/2013    Procedure: ULTRA SOUND GUIDED INSERTION PORT-A-CATH;  Surgeon: Adolph Pollack, MD;  Location: [facility];  Service: General;  Laterality: Right;  . Flexible sigmoidoscopy N/A 08/19/2014    Procedure: FLEXIBLE SIGMOIDOSCOPY;  Surgeon: Iva Boop, MD;  Location: [facility];  Service: Endoscopy;  Laterality: N/A;  . Colonic stent placement N/A 08/19/2014    Procedure: COLONIC STENT PLACEMENT;  Surgeon: Iva Boop, MD;  Location: [facility];  Service: Endoscopy;  Laterality: N/A;  . Flexible sigmoidoscopy N/A 08/19/2014    Procedure: FLEXIBLE SIGMOIDOSCOPY;  Surgeon: Iva Boop, MD;  Location: [facility];  Service: Endoscopy;  Laterality: N/A;  . Colonic stent placement N/A 08/19/2014  Procedure: COLONIC STENT PLACEMENT;  Surgeon: Gatha Mayer, MD;  Location: [facility];  Service: Endoscopy;  Laterality: N/A;  . Colon resection N/A 10/28/2014    Procedure: Transverse loop colostomy;  Surgeon: [name], MD;  Location: [facility];  Service: General;  Laterality: N/A;  . Colostomy revision N/A 01/03/2015    Procedure: REVISION OF TRANSVERSE LOOP COLOSTOMY WITH PARTIAL  COLECTOMY;  Surgeon: [name], MD;  Location: [facility];  Service: General;  Laterality: N/A;  . Laparoscopic low anterior resection  03/06/2015    03/06/2015  . Laparoscopic splenectomy  03/06/2015  . Colon resection N/A 03/06/2015    Procedure: LAPAROSCOPIC takedown loop colostomy ;  Surgeon: Michael Boston, MD;  Location: [facility];  Service: General;  Laterality: N/A;  . 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 Bowel resection  03/06/2015    Procedure: LOW ANTERIOR BOWEL RESECTION, rigid proctoscopy;  Surgeon: Michael Boston, MD;  Location: [facility];  Service: General;;  . Polypectomy  03/06/2015    Procedure: transverse POLYPECTOMY x 4;  Surgeon: Michael Boston, MD;  Location: [facility];  Service: General;;    REVIEW OF SYSTEMS:   Constitutional: Denies fevers, chills or abnormal weight loss Eyes: Denies blurriness of vision Ears, nose, mouth, throat, and face: Denies mucositis or sore throat Respiratory: Denies cough, dyspnea or wheezes Cardiovascular: Denies palpitation, chest discomfort or lower extremity swelling Gastrointestinal:  Denies nausea, heartburn or change in bowel habits Skin: Denies abnormal skin rashes Lymphatics: Denies new lymphadenopathy or easy bruising Neurological:Denies numbness, tingling or new weaknesses Behavioral/Psych: Mood is stable, no new changes  All other systems were reviewed with the patient and are negative.  PHYSICAL EXAMINATION: ECOG PERFORMANCE STATUS: 0 Blood pressure 126/62, pulse 73, temperature 98.3 F (36.8 C), temperature source Oral, resp. rate 18, weight 155 lb 6.4 oz (70.489 kg), SpO2 100 %. GENERAL:alert, no distress and comfortable; well-developed, well nourished.  SKIN: skin color, texture, turgor are normal, no rashes or significant lesions; + R Port, hyperpigmentation of her hands.  EYES: normal, Conjunctiva are pink and non-injected, sclera clear  OROPHARYNX:no exudate, no erythema and lips,  buccal mucosa  NECK: supple, thyroid normal size, non-tender, without nodularity  LYMPH: no palpable lymphadenopathy in the cervical, axillary or supraclavicular  LUNGS: clear to auscultation and percussion with normal breathing effort  HEART: Tachycardic with regular rhythm and no murmurs and no lower extremity edema  ABDOMEN:abdomen soft, non-tender and normal bowel sounds. Well-healed surgical scar in the right upper quadrant.  Bowel sounds normal.   Musculoskeletal:no cyanosis of digits and no clubbing  NEURO: alert & oriented x 3 with fluent speech, no focal motor/sensory deficits EXTREMITIES: no leg swollen.  Labs:  CBC Latest Ref Rng 08/01/2015 06/20/2015 05/09/2015  WBC 3.9 - 10.3 10e3/uL 4.4 4.7 6.2  Hemoglobin 11.6 - 15.9 g/dL 10.6(L) 9.8(L) 10.0(L)  Hematocrit 34.8 - 46.6 % 32.4(L) 30.6(L) 31.1(L)  Platelets 145 - 400 10e3/uL 369 384 370   CMP Latest Ref Rng 08/01/2015 06/20/2015 05/09/2015  Glucose 70 - 140 mg/dl 96 95 88  BUN 7.0 - 26.0 mg/dL 12.2 11.5 13.7  Creatinine 0.6 - 1.1 mg/dL 0.8 0.8 0.7  Sodium 136 - 145 mEq/L 137 137 138  Potassium 3.5 - 5.1 mEq/L 4.2 4.2 4.4  Chloride 101 - 111 mmol/L - - -  CO2 22 - 29 mEq/L 23 21(L) 23  Calcium 8.4 - 10.4  mg/dL 11.3(H) 11.1(H) 11.3(H)  Total Protein 6.4 - 8.3 g/dL 7.5 7.6 7.2  Total Bilirubin 0.20 - 1.20 mg/dL <0.30 <0.30 <0.30  Alkaline Phos 40 - 150 U/L 64 68 77  AST 5 - 34 U/L _0 ALT 0 - 55 U/L _1 PATHOLOGY REPORT Diagnosis 03/06/2015 1. Vagina, biopsy, vaginal cuff - METASTATIC ADENOCARCINOMA, SEE COMMENT. 2. Colon, polyp(s), transverse - TUBULAR ADENOMA WITH HIGH GRADE DYSPLASIA (X2), SEE COMMENT. - TUBULAR ADENOMA (X2). - NO INVASIVE CARCINOMA IDENTIFIED. 3. Colon, colostomy stoma - FINDINGS CONSISTENT WITH COLOSTOMY STOMA. - NO MALIGNANCY IDENTIFIED. 4. Colon, segmental resection for tumor, rectosigmoid cancer - INVASIVE ADENOCARCINOMA, WELL DIFFERENTIATED, SPANNING 7.6 CM. - TUMOR INVADES  THROUGH SEROSA. - RESECTION MARGINS ARE NEGATIVE. - THIRTY-TWO OF THIRTY-TWO LYMPH NODES NEGATIVE FOR CARCINOMA (0/32). - SEE ONCOLOGY TABLE. 5. Colon, resection margin (donut), proximal - BENIGN COLONIC MUCOSA. - NO DYSPLASIA OR MALIGNANCY. 6. Colon, resection margin (donut), distal - BENIGN COLONIC MUCOSA. - NO DYSPLASIA OR MALIGNANCY. 7. Spleen, possible metastasis - SPLEEN WITH PLASMA CELL INFILTRATE AND FIBROSIS, SEE COMMENT. - NO METASTATIC CARCINOMA IDENTIFIED.  Pathologic Staging: ypT4a, ypN0, ypM1b   MOLECULAR FEATURES ([facility]):   RADIOGRAPHIC STUDIES: CT chest, abdomen and pelvis 11/13/2014 Hepatobiliary: Again noted is a large liver lesion centered in the right lobe of the liver measuring approximately 7.6 x 4.7 cm, which is similar in appearance to prior studies demonstrating early peripheral nodular enhancement with progressive centripetal filling, compatible with a cavernous hemangioma. However, today's study demonstrates a new 2.3 x 2.2 cm hypovascular lesion posterior to the cavernous hemangioma in segments 6/7 (image 57 of series 2), concerning for a new metastatic lesion. There is also a new hypovascular lesion in segment 2 (image 51 of series 2) measuring 2.5 x 2.3 cm. Several other smaller low-attenuation lesions are noted, but are too small to definitively characterize, but similar to prior examinations (potentially small cysts). No intra or extrahepatic biliary ductal dilatation. Gallbladder is normal in appearance. Partially calcified lesion in segment 2 of the liver is unchanged, and may either represent a treated metastatic lesion or a calcified granuloma. Other smaller cavernous hemangioma measuring 2.1 cm in segment 8 (image 48 of series 2) is similar to prior Examinations.  IMPRESSION: 1. Large stent in place extending from the distal sigmoid colon into the proximal rectum traversing the known colorectal neoplasm which has significantly  increased in size compared to the prior study, with greater extension into the surrounding mesorectal/mesocolic fat, with enlarging (but not technically enlarged) mesorectal lymph nodes and new liver lesions, as detailed above, compatible with new hepatic metastases. 2. Previously noted enhancing splenic lesion continues to enlarge and remains concerning for possible metastasis. 3. No definite signs of metastatic disease to the thorax at this time. 4. Nonobstructive calculi in the left renal collecting system measuring up to 8 mm in the lower pole. 5. Additional incidental findings, similar to prior studies, as above.   ASSESSMENT: KHAILA VELARDE 65 y.o. female with a history of stage IV colon adenocarcinoma, KRAS mutation(+), on capecitabine maintenance therapy..   1. 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 Now her primary rectosigmoid colon cancer has been removed, she still has multiple small liver metastasis (biopsy confirmed in December 2014), and a small peritoneal disease (positive vaginal cuff biopsy) -She is clinically doing very well, asymptomatic. Again, she is reluctant to restart chemotherapy, due  to the concern of side effects from chemotherapy. We previously discussed that early chemo is likely prolong her life more than wait and see, but she really appreciate her current quality of life more than quantity of life. After lengthy discussion again, we decided to continue observation for now -She previously tolerated 5/fu or capecitabine poorly. Single agent irinotecan with avastin (she never had before) would be next line therapy if she needs it in future. -Due to the K-ras mutation, she is not candidate for EGFR inhibitor therapy -Her tumor has stable MSI, unlikely will response to immunotherapy -We discussed the option of clinical trial, she is not interested. -She is  clinically doing very well, we'll continue observation for now, no routine scan unless symptomatic.  -I'll see her back in 2 weeks for follow-up.  2. PE diagnosed on 11/13/2014 -Continue Xarelto. She is tolerating well, will continue, no clinical signs of bleeding.  3. Anemia, secondary to chemo, and surgery  -improved, hemoglobin 10.6 today   -continue ferrous sulfate -Continue monitoring, will repeat ferritin and iron study every 3 month, to see if she needs IV feraheme   4. 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  -She tolerated the Zometa last time. Calcium level came down some, stable, 11.3 today -We'll continue watch. Consider Zometa if her calcium>12    PLAN -RTC in 4 weeks for port flush and I'll see her back in 2 months with lab    All questions were answered. The patient knows to call the clinic with any problems, questions or concerns. We can certainly see the patient much sooner if necessary.  I spent 20 minutes counseling the patient face to face. The total time spent in the appointment was 25 minutes.  Truitt Merle  08/01/2015

## 2015-08-01 NOTE — Patient Instructions (Signed)

## 2015-08-01 NOTE — Telephone Encounter (Signed)
Pt confirmed labs/ov per 01/27 POF, gave pt AVS and Calendar... KJ

## 2015-08-29 ENCOUNTER — Other Ambulatory Visit (HOSPITAL_BASED_OUTPATIENT_CLINIC_OR_DEPARTMENT_OTHER): Payer: Medicaid Other

## 2015-08-29 ENCOUNTER — Ambulatory Visit (HOSPITAL_BASED_OUTPATIENT_CLINIC_OR_DEPARTMENT_OTHER): Payer: Medicaid Other

## 2015-08-29 DIAGNOSIS — C19 Malignant neoplasm of rectosigmoid junction: Secondary | ICD-10-CM

## 2015-08-29 LAB — CBC & DIFF AND RETIC
BASO%: 0.2 % (ref 0.0–2.0)
Basophils Absolute: 0 10*3/uL (ref 0.0–0.1)
EOS%: 3.6 % (ref 0.0–7.0)
Eosinophils Absolute: 0.2 10*3/uL (ref 0.0–0.5)
HEMATOCRIT: 33 % — AB (ref 34.8–46.6)
HEMOGLOBIN: 10.8 g/dL — AB (ref 11.6–15.9)
Immature Retic Fract: 1.7 % (ref 1.60–10.00)
LYMPH#: 1.8 10*3/uL (ref 0.9–3.3)
LYMPH%: 44 % (ref 14.0–49.7)
MCH: 27.1 pg (ref 25.1–34.0)
MCHC: 32.7 g/dL (ref 31.5–36.0)
MCV: 82.9 fL (ref 79.5–101.0)
MONO#: 0.3 10*3/uL (ref 0.1–0.9)
MONO%: 6.8 % (ref 0.0–14.0)
NEUT#: 1.9 10*3/uL (ref 1.5–6.5)
NEUT%: 45.4 % (ref 38.4–76.8)
PLATELETS: 353 10*3/uL (ref 145–400)
RBC: 3.98 10*6/uL (ref 3.70–5.45)
RDW: 13.9 % (ref 11.2–14.5)
RETIC %: 0.51 % — AB (ref 0.70–2.10)
Retic Ct Abs: 20.3 10*3/uL — ABNORMAL LOW (ref 33.70–90.70)
WBC: 4.1 10*3/uL (ref 3.9–10.3)
nRBC: 0 % (ref 0–0)

## 2015-08-29 LAB — COMPREHENSIVE METABOLIC PANEL
ALBUMIN: 3.6 g/dL (ref 3.5–5.0)
ALK PHOS: 71 U/L (ref 40–150)
ALT: 27 U/L (ref 0–55)
ANION GAP: 6 meq/L (ref 3–11)
AST: 29 U/L (ref 5–34)
BUN: 10.2 mg/dL (ref 7.0–26.0)
CALCIUM: 11.4 mg/dL — AB (ref 8.4–10.4)
CHLORIDE: 103 meq/L (ref 98–109)
CO2: 26 mEq/L (ref 22–29)
CREATININE: 0.7 mg/dL (ref 0.6–1.1)
EGFR: >90 mL/min/{1.73_m2} (ref >90)
Glucose: 90 mg/dl (ref 70–140)
Potassium: 4.2 mEq/L (ref 3.5–5.1)
Sodium: 136 mEq/L (ref 136–145)
Total Bilirubin: <0.3 mg/dL (ref 0.20–1.20)
Total Protein: 7.8 g/dL (ref 6.4–8.3)

## 2015-08-29 MED ORDER — SODIUM CHLORIDE 0.9% FLUSH
10.0000 mL | INTRAVENOUS | Status: DC | PRN
  Administered 2015-08-29: 10 mL via INTRAVENOUS
  Filled 2015-08-29: qty 10

## 2015-08-29 MED ORDER — HEPARIN SOD (PORK) LOCK FLUSH 100 UNIT/ML IV SOLN
500.0000 [IU] | Freq: Once | INTRAVENOUS | Status: AC
  Administered 2015-08-29: 500 [IU] via INTRAVENOUS
  Filled 2015-08-29: qty 5

## 2015-08-29 NOTE — Patient Instructions (Signed)

## 2015-09-02 ENCOUNTER — Encounter (HOSPITAL_COMMUNITY): Payer: Self-pay | Admitting: Emergency Medicine

## 2015-09-02 ENCOUNTER — Emergency Department (HOSPITAL_COMMUNITY)
Admission: EM | Admit: 2015-09-02 | Discharge: 2015-09-02 | Disposition: A | Discharge status: Home / Self Care | Payer: Medicaid Other | Attending: Emergency Medicine | Admitting: Emergency Medicine

## 2015-09-02 DIAGNOSIS — D649 Anemia, unspecified: Secondary | ICD-10-CM | POA: Diagnosis not present

## 2015-09-02 DIAGNOSIS — R42 Dizziness and giddiness: Secondary | ICD-10-CM | POA: Diagnosis not present

## 2015-09-02 DIAGNOSIS — R200 Anesthesia of skin: Secondary | ICD-10-CM | POA: Diagnosis present

## 2015-09-02 DIAGNOSIS — Z79899 Other long term (current) drug therapy: Secondary | ICD-10-CM | POA: Diagnosis not present

## 2015-09-02 DIAGNOSIS — Z87891 Personal history of nicotine dependence: Secondary | ICD-10-CM | POA: Insufficient documentation

## 2015-09-02 DIAGNOSIS — Z85038 Personal history of other malignant neoplasm of large intestine: Secondary | ICD-10-CM | POA: Insufficient documentation

## 2015-09-02 DIAGNOSIS — Z86711 Personal history of pulmonary embolism: Secondary | ICD-10-CM | POA: Insufficient documentation

## 2015-09-02 DIAGNOSIS — R011 Cardiac murmur, unspecified: Secondary | ICD-10-CM | POA: Insufficient documentation

## 2015-09-02 DIAGNOSIS — Z7901 Long term (current) use of anticoagulants: Secondary | ICD-10-CM | POA: Diagnosis not present

## 2015-09-02 DIAGNOSIS — R531 Weakness: Secondary | ICD-10-CM | POA: Diagnosis not present

## 2015-09-02 NOTE — ED Provider Notes (Signed)
CSN: [id number]     Arrival date & time 09/02/15  [id number] History   First MD Initiated Contact with Patient 09/02/15 1129     Chief Complaint  Patient presents with  . Numbness  . Extremity Weakness     (Consider location/radiation/quality/duration/timing/severity/associated sxs/prior Treatment) HPI......Marland KitchenNumbness and tingling to right foot this morning. She felt lightheaded yesterday. No frank neurological deficits. Patient is status post colon resection in September 2016 for colon cancer. She is presently on Xarelto for past history of pulmonary embolism. She states she is ambulatory.  No fever, sweats, chills, chest pain, dyspnea.  Past Medical History  Diagnosis Date  . Colon cancer ([place])     dx'd 2014  . Heart murmur     ? as a child   . Pulmonary embolism ([place]) 11/2014   . Anemia     hx of    Past Surgical History  Procedure Laterality Date  . Abdominal hysterectomy  2005  . Cesarean section  1976  . Flexible sigmoidoscopy N/A 05/25/2013    Procedure: FLEXIBLE SIGMOIDOSCOPY;  Surgeon: [name], MD;  Location: [facility];  Service: Endoscopy;  Laterality: N/A;  needs floro  . Colonic stent placement N/A 05/25/2013    Procedure: COLONIC STENT PLACEMENT;  Surgeon: [name], MD;  Location: [facility];  Service: Endoscopy;  Laterality: N/A;  . Portacath placement Right 06/25/2013    Procedure: ULTRA SOUND GUIDED INSERTION PORT-A-CATH;  Surgeon: [name], MD;  Location: [facility];  Service: General;  Laterality: Right;  . Flexible sigmoidoscopy N/A 08/19/2014    Procedure: FLEXIBLE SIGMOIDOSCOPY;  Surgeon: Gatha Mayer, MD;  Location: [facility];  Service: Endoscopy;  Laterality: N/A;  . Colonic stent placement N/A 08/19/2014    Procedure: COLONIC STENT PLACEMENT;  Surgeon: Gatha Mayer, MD;  [facility]: [facility];  Service: Endoscopy;  Laterality: N/A;  . Flexible sigmoidoscopy N/A 08/19/2014    Procedure: FLEXIBLE SIGMOIDOSCOPY;   Surgeon: Gatha Mayer, MD;  [facility];  Service: Endoscopy;  Laterality: N/A;  . Colonic stent placement N/A 08/19/2014    Procedure: COLONIC STENT PLACEMENT;  Surgeon: Gatha Mayer, MD;  [facility];  Service: Endoscopy;  Laterality: N/A;  . Colon resection N/A 10/28/2014    Procedure: Transverse loop colostomy;  Surgeon: [name], MD;  [facility]: [facility];  Service: General;  Laterality: N/A;  . Colostomy revision N/A 01/03/2015    Procedure: REVISION OF TRANSVERSE LOOP COLOSTOMY WITH PARTIAL  COLECTOMY;  Surgeon: [name], MD;  Location: [facility];  Service: General;  Laterality: N/A;  . Laparoscopic low anterior resection  03/06/2015    03/06/2015  . Laparoscopic splenectomy  03/06/2015  . Colon resection N/A 03/06/2015    Procedure: LAPAROSCOPIC takedown loop colostomy ;  Surgeon: Michael Boston, MD;  [facility]: [facility];  Service: General;  Laterality: N/A;  . 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 Bowel resection  03/06/2015    Procedure: LOW ANTERIOR BOWEL RESECTION, rigid proctoscopy;  Surgeon: Michael Boston, MD;  [facility];  Service: General;;  . Polypectomy  03/06/2015    Procedure: transverse POLYPECTOMY x 4;  Surgeon: Michael Boston, MD;  [facility];  Service: General;;   Family History  Problem Relation Age of Onset  . Colon cancer Sister 15  . Diabetes Neg Hx   . Thyroid disease Neg Hx   .  Breast cancer Maternal Aunt   . Prostate cancer Maternal Uncle   . Bone cancer Maternal Grandfather   . Prostate cancer Maternal Uncle    Social History  Substance Use Topics  . Smoking status: Former Smoker -- 1 years    Types: Cigarettes    Quit date: 05/24/1971  . Smokeless tobacco: Never Used  . Alcohol Use: No   OB History    No data available     Review of Systems  All other systems reviewed and are negative.     Allergies  Oxycontin  and Codeine  Home Medications   Prior to Admission medications   Medication Sig Start Date End Date Taking? Authorizing Provider  acetaminophen (TYLENOL) 500 MG tablet Take 500 mg by mouth every 6 (six) hours as needed for moderate pain or headache.    Yes Historical Provider, MD  Cyanocobalamin (VITAMIN B 12 PO) Take 1 tablet by mouth every morning.   Yes Historical Provider, MD  dextromethorphan (DELSYM) 30 MG/5ML liquid Take 10 mLs by mouth every 12 (twelve) hours as needed for cough.   Yes Historical Provider, MD  IRON PO Take 1 tablet by mouth every morning.   Yes Historical Provider, MD  lidocaine-prilocaine (EMLA) cream Apply 1 tsp to site one hour before use, cover with plastic wrap. 03/31/15  Yes Truitt Merle, MD  polyethylene glycol powder (GLYCOLAX/MIRALAX) powder TAKE 34 G (2 DOSES) BY MOUTH DAILY AS NEEDED FOR MILD CONSTIPATION OR MODERATE CONSTIPATION 03/03/14  Yes Concha Norway, MD  rivaroxaban (XARELTO) 20 MG TABS tablet Take 1 tablet (20 mg total) by mouth daily with supper. 06/20/15  Yes Truitt Merle, MD   BP 117/50 mmHg  Pulse 75  Temp(Src) 98.2 F (36.8 C) (Oral)  Resp 18  SpO2 99% Physical Exam  Constitutional: She is oriented to person, place, and time. She appears well-developed and well-nourished.  Pleasant, alert, no acute distress  HENT:  Head: Normocephalic and atraumatic.  Eyes: Conjunctivae and EOM are normal. Pupils are equal, round, and reactive to light.  Neck: Normal range of motion. Neck supple.  Cardiovascular: Normal rate and regular rhythm.   Pulmonary/Chest: Effort normal and breath sounds normal.  Abdominal: Soft. Bowel sounds are normal.  Musculoskeletal: Normal range of motion.  Full range of motion right lower extremity. Good circulation of foot.  Neurological: She is alert and oriented to person, place, and time.  No frank neurological deficits. No facial slurring. No facial asymmetry. Full range of motion of arms and legs  Skin: Skin is warm and dry.   Psychiatric: She has a normal mood and affect. Her behavior is normal.  Nursing note and vitals reviewed.   ED Course  Procedures (including critical care time) Labs Review Labs Reviewed - No data to display  Imaging Review No results found. I have personally reviewed and evaluated these images and lab results as part of my medical decision-making.   EKG Interpretation None      MDM   Final diagnoses:  Numbness of feet    Uncertain etiology of the numbness and tingling in the right foot. This could conceivably be a radicular issue from the lower back. However no part of the history seems to be congruent with this. Patient will follow-up with her primary care doctor.    Nat Christen, MD 09/02/15 [id number]

## 2015-09-02 NOTE — Discharge Instructions (Signed)
I reviewed your blood work from February 24.   You're slightly anemic with a hemoglobin of 10.8.    Also your calcium was slightly elevated at 11.4.    None of these were new or different findings

## 2015-09-02 NOTE — ED Notes (Signed)
Pt states yesterday felt lightheaded occasionally. When she woke up this morning she noticed some numbness to her right foot. States when she walks her leg occasionally gives out. Right foot feels cold to touch, has a strong pedal pulse, sensation intact, capillary refill >3 seconds to extremity, able to move toes/foot. General neuro exam WNL in triage. States she is on Xarelto for blood clots, denies falling or hitting head recently.

## 2015-09-08 ENCOUNTER — Ambulatory Visit
Admission: RE | Admit: 2015-09-08 | Discharge: 2015-09-08 | Disposition: A | Discharge status: Home / Self Care | Payer: Medicaid Other | Source: Ambulatory Visit | Attending: Family Medicine | Admitting: Family Medicine

## 2015-09-08 ENCOUNTER — Other Ambulatory Visit: Payer: Self-pay | Admitting: Family Medicine

## 2015-09-08 DIAGNOSIS — R29898 Other symptoms and signs involving the musculoskeletal system: Secondary | ICD-10-CM

## 2015-09-11 ENCOUNTER — Other Ambulatory Visit: Payer: Self-pay | Admitting: Family Medicine

## 2015-09-11 DIAGNOSIS — M545 Low back pain: Secondary | ICD-10-CM

## 2015-09-12 ENCOUNTER — Inpatient Hospital Stay (HOSPITAL_COMMUNITY)
Admission: EM | Admit: 2015-09-12 | Discharge: 2015-09-17 | DRG: 054 | Disposition: A | Discharge status: Rehabilitation Facility | Payer: Medicaid Other | Attending: Family Medicine | Admitting: Family Medicine

## 2015-09-12 ENCOUNTER — Emergency Department (HOSPITAL_COMMUNITY): Payer: Medicaid Other

## 2015-09-12 ENCOUNTER — Encounter (HOSPITAL_COMMUNITY): Payer: Self-pay | Admitting: Emergency Medicine

## 2015-09-12 DIAGNOSIS — D638 Anemia in other chronic diseases classified elsewhere: Secondary | ICD-10-CM | POA: Diagnosis present

## 2015-09-12 DIAGNOSIS — M4806 Spinal stenosis, lumbar region: Secondary | ICD-10-CM | POA: Diagnosis present

## 2015-09-12 DIAGNOSIS — Z515 Encounter for palliative care: Secondary | ICD-10-CM | POA: Diagnosis not present

## 2015-09-12 DIAGNOSIS — C189 Malignant neoplasm of colon, unspecified: Secondary | ICD-10-CM | POA: Diagnosis present

## 2015-09-12 DIAGNOSIS — R29898 Other symptoms and signs involving the musculoskeletal system: Secondary | ICD-10-CM | POA: Diagnosis present

## 2015-09-12 DIAGNOSIS — Z87891 Personal history of nicotine dependence: Secondary | ICD-10-CM

## 2015-09-12 DIAGNOSIS — C7931 Secondary malignant neoplasm of brain: Principal | ICD-10-CM | POA: Diagnosis present

## 2015-09-12 DIAGNOSIS — R270 Ataxia, unspecified: Secondary | ICD-10-CM | POA: Insufficient documentation

## 2015-09-12 DIAGNOSIS — M25552 Pain in left hip: Secondary | ICD-10-CM | POA: Diagnosis present

## 2015-09-12 DIAGNOSIS — Z9221 Personal history of antineoplastic chemotherapy: Secondary | ICD-10-CM

## 2015-09-12 DIAGNOSIS — C787 Secondary malignant neoplasm of liver and intrahepatic bile duct: Secondary | ICD-10-CM | POA: Diagnosis present

## 2015-09-12 DIAGNOSIS — R531 Weakness: Secondary | ICD-10-CM

## 2015-09-12 DIAGNOSIS — Z7901 Long term (current) use of anticoagulants: Secondary | ICD-10-CM

## 2015-09-12 DIAGNOSIS — Z886 Allergy status to analgesic agent status: Secondary | ICD-10-CM

## 2015-09-12 DIAGNOSIS — K219 Gastro-esophageal reflux disease without esophagitis: Secondary | ICD-10-CM | POA: Diagnosis present

## 2015-09-12 DIAGNOSIS — Z79899 Other long term (current) drug therapy: Secondary | ICD-10-CM

## 2015-09-12 DIAGNOSIS — M5126 Other intervertebral disc displacement, lumbar region: Secondary | ICD-10-CM | POA: Diagnosis present

## 2015-09-12 DIAGNOSIS — I2782 Chronic pulmonary embolism: Secondary | ICD-10-CM | POA: Diagnosis present

## 2015-09-12 DIAGNOSIS — Z9119 Patient's noncompliance with other medical treatment and regimen: Secondary | ICD-10-CM

## 2015-09-12 DIAGNOSIS — G8191 Hemiplegia, unspecified affecting right dominant side: Secondary | ICD-10-CM | POA: Diagnosis present

## 2015-09-12 DIAGNOSIS — C786 Secondary malignant neoplasm of retroperitoneum and peritoneum: Secondary | ICD-10-CM | POA: Diagnosis present

## 2015-09-12 DIAGNOSIS — G936 Cerebral edema: Secondary | ICD-10-CM | POA: Diagnosis present

## 2015-09-12 MED ORDER — ACETAMINOPHEN 500 MG PO TABS
500.0000 mg | ORAL_TABLET | Freq: Once | ORAL | Status: AC
  Administered 2015-09-12: 500 mg via ORAL
  Filled 2015-09-12: qty 1

## 2015-09-12 MED ORDER — FENTANYL CITRATE (PF) 100 MCG/2ML IJ SOLN
50.0000 ug | Freq: Once | INTRAMUSCULAR | Status: AC
  Administered 2015-09-12: 50 ug via INTRAMUSCULAR
  Filled 2015-09-12: qty 2

## 2015-09-12 NOTE — Progress Notes (Signed)
[facility] consulted for a walker.  EDCM instructed EDPA to provided patient with a prescription for a walker and provide to patient to take to [facility] store to purchase walker.  Per EDPA, patient has family that can stay with patient.  No further EDCM needs at this time.

## 2015-09-12 NOTE — ED Provider Notes (Signed)
CSN: [id number]     Arrival date & time 09/12/15  1439 History   First MD Initiated Contact with Patient 09/12/15 2024     Chief Complaint  Patient presents with  . Fall  . Extremity Weakness   HPI   Ms. Venditti is an 65 y.o. female with history of stage IV colon cancer who presents to the ED for evaluation of right leg numbness and weakness. She states her symptoms began around 2/28 when she noticed numbness of her right foot. She was seen in the ED and had negative workup, diagnosed with possible radicular pain. Pt states she saw her PCP who did an l-spine x-ray and was planning for MRI later this month. Pt is here today because she states the weakness and numbness in her leg is getting worse. She states she is having increased falls due to the weakness and difficulty ambulating. SHe states she last fell last night and hit her right forearm and stubbed her right foot in the doorjam. She states she is also developing diffuse low back pain and hip pain. She reports she has intact sensation, but her foot feels like pins and needles when she steps on it and states she is unable to wiggle her toes or lift her leg. Denies fever, chills. Denies bowel/bladder incontinence. Denies saddle paresthesias.   Past Medical History  Diagnosis Date  . Colon cancer ([place])     dx'd 2014  . Heart murmur     ? as a child   . Pulmonary embolism ([place]) 11/2014   . Anemia     hx of    Past Surgical History  Procedure Laterality Date  . Abdominal hysterectomy  2005  . Cesarean section  1976  . Flexible sigmoidoscopy N/A 05/25/2013    Procedure: FLEXIBLE SIGMOIDOSCOPY;  Surgeon: [name], MD;  Location: [facility];  Service: Endoscopy;  Laterality: N/A;  needs floro  . Colonic stent placement N/A 05/25/2013    Procedure: COLONIC STENT PLACEMENT;  Surgeon: [name], MD;  Location: [facility];  Service: Endoscopy;  Laterality: N/A;  . Portacath placement Right 06/25/2013    Procedure: ULTRA SOUND  GUIDED INSERTION PORT-A-CATH;  Surgeon: [name], MD;  Location: [place];  Service: General;  Laterality: Right;  . Flexible sigmoidoscopy N/A 08/19/2014    Procedure: FLEXIBLE SIGMOIDOSCOPY;  Surgeon: Gatha Mayer, MD;  Location: [facility];  Service: Endoscopy;  Laterality: N/A;  . Colonic stent placement N/A 08/19/2014    Procedure: COLONIC STENT PLACEMENT;  Surgeon: Gatha Mayer, MD;  Location: [facility];  Service: Endoscopy;  Laterality: N/A;  . Flexible sigmoidoscopy N/A 08/19/2014    Procedure: FLEXIBLE SIGMOIDOSCOPY;  Surgeon: Gatha Mayer, MD;  Location: [facility];  Service: Endoscopy;  Laterality: N/A;  . Colonic stent placement N/A 08/19/2014    Procedure: COLONIC STENT PLACEMENT;  Surgeon: Gatha Mayer, MD;  Location: [facility];  Service: Endoscopy;  Laterality: N/A;  . Colon resection N/A 10/28/2014    Procedure: Transverse loop colostomy;  Surgeon: [name], MD;  Location: [facility];  Service: General;  Laterality: N/A;  . Colostomy revision N/A 01/03/2015    Procedure: REVISION OF TRANSVERSE LOOP COLOSTOMY WITH PARTIAL  COLECTOMY;  Surgeon: [name], MD;  Location: [facility];  Service: General;  Laterality: N/A;  . Laparoscopic low anterior resection  03/06/2015    03/06/2015  . Laparoscopic splenectomy  03/06/2015  . Colon resection N/A 03/06/2015    Procedure: LAPAROSCOPIC takedown  loop colostomy ;  Surgeon: Michael Boston, MD;  Location: [facility];  Service: General;  Laterality: N/A;  . 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 Bowel resection  03/06/2015    Procedure: LOW ANTERIOR BOWEL RESECTION, rigid proctoscopy;  Surgeon: Michael Boston, MD;  Location: [facility];  Service: General;;  . Polypectomy  03/06/2015    Procedure: transverse POLYPECTOMY x 4;  Surgeon: Michael Boston, MD;  Location: [facility];  Service: General;;   Family History   Problem Relation Age of Onset  . Colon cancer Sister 19  . Diabetes Neg Hx   . Thyroid disease Neg Hx   . Breast cancer Maternal Aunt   . Prostate cancer Maternal Uncle   . Bone cancer Maternal Grandfather   . Prostate cancer Maternal Uncle    Social History  Substance Use Topics  . Smoking status: Former Smoker -- 1 years    Types: Cigarettes    Quit date: 05/24/1971  . Smokeless tobacco: Never Used  . Alcohol Use: No   OB History    No data available     Review of Systems  All other systems reviewed and are negative.     Allergies  Oxycontin and Codeine  Home Medications   Prior to Admission medications   Medication Sig Start Date End Date Taking? Authorizing Provider  acetaminophen (TYLENOL) 500 MG tablet Take 500 mg by mouth every 6 (six) hours as needed for moderate pain or headache.    Yes Historical Provider, MD  Cyanocobalamin (VITAMIN B 12 PO) Take 1 tablet by mouth every morning.   Yes Historical Provider, MD  dextromethorphan (DELSYM) 30 MG/5ML liquid Take 10 mLs by mouth every 12 (twelve) hours as needed for cough.   Yes Historical Provider, MD  diphenhydrAMINE (BENADRYL) 25 MG tablet Take 25 mg by mouth every 6 (six) hours as needed (cough).   Yes Historical Provider, MD  gabapentin (NEURONTIN) 300 MG capsule Take 300 mg by mouth 3 (three) times daily. 09/03/15  Yes Historical Provider, MD  IRON PO Take 1 tablet by mouth every morning.   Yes Historical Provider, MD  polyethylene glycol powder (GLYCOLAX/MIRALAX) powder TAKE 34 G (2 DOSES) BY MOUTH DAILY AS NEEDED FOR MILD CONSTIPATION OR MODERATE CONSTIPATION 03/03/14  Yes Concha Norway, MD  rivaroxaban (XARELTO) 20 MG TABS tablet Take 1 tablet (20 mg total) by mouth daily with supper. 06/20/15  Yes Truitt Merle, MD  lidocaine-prilocaine (EMLA) cream Apply 1 tsp to site one hour before use, cover with plastic wrap. 03/31/15   Truitt Merle, MD   BP 126/80 mmHg  Pulse 91  Temp(Src) 98.1 F (36.7 C) (Oral)  Resp 16  SpO2  100% Physical Exam  Constitutional: She is oriented to person, place, and time. No distress.  HENT:  Head: Atraumatic.  Right Ear: External ear normal.  Left Ear: External ear normal.  Nose: Nose normal.  Eyes: Conjunctivae are normal. No scleral icterus.  Neck: Normal range of motion. Neck supple.  Cardiovascular: Normal rate and regular rhythm.   Pulmonary/Chest: Effort normal. No respiratory distress. She exhibits no tenderness.  Abdominal: Soft. She exhibits no distension. There is no tenderness.  Musculoskeletal: She exhibits no edema.  Diffuse low back tenderness, R > L. Minimal midline tenderness. No stepoff or deformity.   Neurological: She is alert and oriented to person, place, and time. No cranial nerve deficit.  Cannot  wiggle right toes. Can barely lift right leg off of bed. Left LE intact strength and movement. Bilateral sensation intact.  UE strength and sensation intact bilaterally.   Skin: Skin is warm and dry. She is not diaphoretic.  Right forearm with well healing superficial laceration.   Psychiatric: She has a normal mood and affect. Her behavior is normal.  Nursing note and vitals reviewed.    ED Course  Procedures (including critical care time) Labs Review Labs Reviewed - No data to display  Imaging Review Dg Abd 1 View  09/13/2015  CLINICAL DATA:  Weakness.  Rule out metallic stent. EXAM: ABDOMEN - 1 VIEW COMPARISON:  CT 03/03/2015 FINDINGS: Colonic stent noted 03/03/2015 is no longer visualized. Bowel sutures seen over the pelvis and central abdomen. Moderate stool volume without obstruction. IMPRESSION: No metallic stent over the colon. Electronically Signed   By: Monte Fantasia M.D.   On: 09/13/2015 01:15   Dg Foot Complete Right  09/12/2015  CLINICAL DATA:  Right foot numbness. Recent falls. Numbness in the right lower extremity. EXAM: RIGHT FOOT COMPLETE - 3+ VIEW COMPARISON:  None. FINDINGS: There is no evidence of fracture or dislocation. There is  no evidence of arthropathy or other focal bone abnormality. Soft tissues are unremarkable. Prominent calcification and spurring is noted at the attachment of the Achilles tendon. IMPRESSION: 1. No acute abnormality. 2. Prominent calcification and spurring at the attachment of the Achilles tendon. Electronically Signed   By: San Morelle M.D.   On: 09/12/2015 15:15   I have personally reviewed and evaluated these images and lab results as part of my medical decision-making.   EKG Interpretation None      MDM   Final diagnoses:  Weakness  Right leg weakness    Given history and progressive severe R LE weakness will obtain MRI to r/o mets or other acute spinal pathology. UE and cranial nerve exam intact.  Radiologist called stating given h/o colonic stent placement we cannot proceed with MRI until we confirm that the stents are MRI safe. I have extensively reviewed EMR. One of the two stents is documented to be removed. Pt states she knows the second stent had been affected by tumor growth but she is not sure if it was removed during her multiple surgeries. Op notes on EMR review do not say specifically whether the second stent was removed. I spoke to Dr. Hassell Done on call for surgery who stated that none of the colonic stents that would have been used are feromagnetic, though can ask radiologist if Nitonol is feromagnetic as that is most likely material of the stent placed.  I spoke to radiologist. Rogelio Seen is not feromagnetic. However, unless we can confirm that the stents are gone or get documentation of what specific stent was placed, they cannot proceed with MRI. Abdominal x-ray is pending. If no radiopaque metallic stents visualized will proceed with MRI.   Abdominal x-ray read is still pending. However at this time MRI at [facility] closed for the night. I discussed case with hospitalist here. Given concern for possible mets, recommend ED to ED transfer to [facility] for MRI there so neurosurgery can  be consulted if needed in the AM. Will call [facility] to arrange ED to ED transfer.  X-ray shows no evidence of metallic stent. I spoke to Dr. Sabra Heck at [facility] who will accept pt for ED-to-ED transfer. Will plan for lumbar spine MRI there and neurosurgery involvement as needed.    Anne Ng, PA-C 09/13/15 [phone]  Charlesetta Shanks, MD 09/17/15 [phone]

## 2015-09-12 NOTE — ED Notes (Addendum)
Pt has been having R foot numbness that has gradually moved up to hip over the last week. No sudden onset of symptoms. Pt has had 2 falls within the last week due to numbness symptoms. Last Saturday she slipped on a rug and hit the back of her head. Pt on xarelto. Also fell last night which caused a scrape on the back of her R forearm. Pt also reports her R arm has gradually become weaker. Pt able to move extremities, but it has become more difficult. Pt was seen for similar symptoms a week ago.

## 2015-09-13 ENCOUNTER — Emergency Department (HOSPITAL_COMMUNITY): Payer: Medicaid Other

## 2015-09-13 ENCOUNTER — Encounter (HOSPITAL_COMMUNITY): Payer: Self-pay

## 2015-09-13 DIAGNOSIS — B370 Candidal stomatitis: Secondary | ICD-10-CM | POA: Diagnosis not present

## 2015-09-13 DIAGNOSIS — Z515 Encounter for palliative care: Secondary | ICD-10-CM | POA: Diagnosis not present

## 2015-09-13 DIAGNOSIS — Z87891 Personal history of nicotine dependence: Secondary | ICD-10-CM | POA: Diagnosis not present

## 2015-09-13 DIAGNOSIS — C7931 Secondary malignant neoplasm of brain: Secondary | ICD-10-CM | POA: Diagnosis present

## 2015-09-13 DIAGNOSIS — M25552 Pain in left hip: Secondary | ICD-10-CM | POA: Diagnosis present

## 2015-09-13 DIAGNOSIS — R29898 Other symptoms and signs involving the musculoskeletal system: Secondary | ICD-10-CM | POA: Diagnosis not present

## 2015-09-13 DIAGNOSIS — L03113 Cellulitis of right upper limb: Secondary | ICD-10-CM | POA: Diagnosis not present

## 2015-09-13 DIAGNOSIS — Z79899 Other long term (current) drug therapy: Secondary | ICD-10-CM | POA: Diagnosis not present

## 2015-09-13 DIAGNOSIS — I2782 Chronic pulmonary embolism: Secondary | ICD-10-CM | POA: Diagnosis present

## 2015-09-13 DIAGNOSIS — D72829 Elevated white blood cell count, unspecified: Secondary | ICD-10-CM | POA: Diagnosis not present

## 2015-09-13 DIAGNOSIS — M4806 Spinal stenosis, lumbar region: Secondary | ICD-10-CM | POA: Diagnosis present

## 2015-09-13 DIAGNOSIS — C786 Secondary malignant neoplasm of retroperitoneum and peritoneum: Secondary | ICD-10-CM | POA: Diagnosis present

## 2015-09-13 DIAGNOSIS — R269 Unspecified abnormalities of gait and mobility: Secondary | ICD-10-CM | POA: Diagnosis not present

## 2015-09-13 DIAGNOSIS — R531 Weakness: Secondary | ICD-10-CM | POA: Diagnosis present

## 2015-09-13 DIAGNOSIS — G8190 Hemiplegia, unspecified affecting unspecified side: Secondary | ICD-10-CM | POA: Diagnosis not present

## 2015-09-13 DIAGNOSIS — Z7901 Long term (current) use of anticoagulants: Secondary | ICD-10-CM | POA: Diagnosis not present

## 2015-09-13 DIAGNOSIS — C787 Secondary malignant neoplasm of liver and intrahepatic bile duct: Secondary | ICD-10-CM | POA: Diagnosis present

## 2015-09-13 DIAGNOSIS — C189 Malignant neoplasm of colon, unspecified: Secondary | ICD-10-CM | POA: Diagnosis present

## 2015-09-13 DIAGNOSIS — D649 Anemia, unspecified: Secondary | ICD-10-CM | POA: Diagnosis not present

## 2015-09-13 DIAGNOSIS — I2690 Septic pulmonary embolism without acute cor pulmonale: Secondary | ICD-10-CM | POA: Diagnosis not present

## 2015-09-13 DIAGNOSIS — K219 Gastro-esophageal reflux disease without esophagitis: Secondary | ICD-10-CM | POA: Diagnosis present

## 2015-09-13 DIAGNOSIS — I10 Essential (primary) hypertension: Secondary | ICD-10-CM | POA: Diagnosis not present

## 2015-09-13 DIAGNOSIS — K120 Recurrent oral aphthae: Secondary | ICD-10-CM | POA: Diagnosis not present

## 2015-09-13 DIAGNOSIS — Z9221 Personal history of antineoplastic chemotherapy: Secondary | ICD-10-CM | POA: Diagnosis not present

## 2015-09-13 DIAGNOSIS — G936 Cerebral edema: Secondary | ICD-10-CM | POA: Diagnosis present

## 2015-09-13 DIAGNOSIS — R270 Ataxia, unspecified: Secondary | ICD-10-CM | POA: Diagnosis not present

## 2015-09-13 DIAGNOSIS — M5126 Other intervertebral disc displacement, lumbar region: Secondary | ICD-10-CM | POA: Diagnosis present

## 2015-09-13 DIAGNOSIS — Z886 Allergy status to analgesic agent status: Secondary | ICD-10-CM | POA: Diagnosis not present

## 2015-09-13 DIAGNOSIS — G8191 Hemiplegia, unspecified affecting right dominant side: Secondary | ICD-10-CM | POA: Diagnosis present

## 2015-09-13 DIAGNOSIS — Z9119 Patient's noncompliance with other medical treatment and regimen: Secondary | ICD-10-CM | POA: Diagnosis not present

## 2015-09-13 DIAGNOSIS — L02413 Cutaneous abscess of right upper limb: Secondary | ICD-10-CM | POA: Diagnosis not present

## 2015-09-13 DIAGNOSIS — D638 Anemia in other chronic diseases classified elsewhere: Secondary | ICD-10-CM | POA: Diagnosis present

## 2015-09-13 LAB — CBC WITH DIFFERENTIAL/PLATELET
BASOS PCT: 0 %
Basophils Absolute: 0 10*3/uL (ref 0.0–0.1)
EOS ABS: 0.2 10*3/uL (ref 0.0–0.7)
Eosinophils Relative: 4 %
HCT: 33.2 % — ABNORMAL LOW (ref 36.0–46.0)
HEMOGLOBIN: 10.8 g/dL — AB (ref 12.0–15.0)
Lymphocytes Relative: 28 %
Lymphs Abs: 1.7 10*3/uL (ref 0.7–4.0)
MCH: 27 pg (ref 26.0–34.0)
MCHC: 32.5 g/dL (ref 30.0–36.0)
MCV: 83 fL (ref 78.0–100.0)
MONOS PCT: 13 %
Monocytes Absolute: 0.8 10*3/uL (ref 0.1–1.0)
NEUTROS PCT: 55 %
Neutro Abs: 3.3 10*3/uL (ref 1.7–7.7)
PLATELETS: 360 10*3/uL (ref 150–400)
RBC: 4 MIL/uL (ref 3.87–5.11)
RDW: 14.4 % (ref 11.5–15.5)
WBC: 6 10*3/uL (ref 4.0–10.5)

## 2015-09-13 LAB — BASIC METABOLIC PANEL
Anion gap: 8 (ref 5–15)
BUN: 10 mg/dL (ref 6–20)
CHLORIDE: 108 mmol/L (ref 101–111)
CO2: 26 mmol/L (ref 22–32)
CREATININE: 0.78 mg/dL (ref 0.44–1.00)
Calcium: 11.5 mg/dL — ABNORMAL HIGH (ref 8.9–10.3)
GFR calc non Af Amer: >60 mL/min (ref >60)
Glucose, Bld: 102 mg/dL — ABNORMAL HIGH (ref 65–99)
Potassium: 4.2 mmol/L (ref 3.5–5.1)
SODIUM: 142 mmol/L (ref 135–145)

## 2015-09-13 LAB — PROTIME-INR
INR: 1.72 — ABNORMAL HIGH (ref 0.00–1.49)
Prothrombin Time: 20.2 seconds — ABNORMAL HIGH (ref 11.6–15.2)

## 2015-09-13 LAB — ABO/RH: ABO/RH(D): AB POS

## 2015-09-13 LAB — TSH: TSH: 0.844 u[IU]/mL (ref 0.350–4.500)

## 2015-09-13 MED ORDER — DEXAMETHASONE SODIUM PHOSPHATE 10 MG/ML IJ SOLN
10.0000 mg | Freq: Once | INTRAMUSCULAR | Status: AC
  Administered 2015-09-13: 10 mg via INTRAVENOUS
  Filled 2015-09-13: qty 1

## 2015-09-13 MED ORDER — RIVAROXABAN 20 MG PO TABS
20.0000 mg | ORAL_TABLET | Freq: Every day | ORAL | Status: DC
  Administered 2015-09-13 – 2015-09-17 (×5): 20 mg via ORAL
  Filled 2015-09-13 (×5): qty 1

## 2015-09-13 MED ORDER — ACETAMINOPHEN 500 MG PO TABS
500.0000 mg | ORAL_TABLET | Freq: Four times a day (QID) | ORAL | Status: DC | PRN
  Administered 2015-09-13 – 2015-09-17 (×6): 500 mg via ORAL
  Filled 2015-09-13 (×5): qty 1

## 2015-09-13 MED ORDER — CALCITONIN (SALMON) 200 UNIT/ML IJ SOLN
50.0000 [IU] | Freq: Every day | INTRAMUSCULAR | Status: DC
  Administered 2015-09-13 – 2015-09-14 (×2): 50 [IU] via INTRAMUSCULAR
  Filled 2015-09-13: qty 0.25
  Filled 2015-09-13: qty 0
  Filled 2015-09-13 (×3): qty 0.25
  Filled 2015-09-13: qty 0

## 2015-09-13 MED ORDER — PANTOPRAZOLE SODIUM 40 MG PO TBEC
40.0000 mg | DELAYED_RELEASE_TABLET | Freq: Every day | ORAL | Status: DC
  Administered 2015-09-13 – 2015-09-17 (×5): 40 mg via ORAL
  Filled 2015-09-13 (×6): qty 1

## 2015-09-13 MED ORDER — VITAMIN B-12 100 MCG PO TABS
100.0000 ug | ORAL_TABLET | Freq: Every day | ORAL | Status: DC
  Administered 2015-09-13 – 2015-09-17 (×5): 100 ug via ORAL
  Filled 2015-09-13 (×9): qty 1

## 2015-09-13 MED ORDER — DEXTROSE-NACL 5-0.9 % IV SOLN
INTRAVENOUS | Status: DC
  Administered 2015-09-13 (×2): via INTRAVENOUS

## 2015-09-13 MED ORDER — DENOSUMAB 120 MG/1.7ML ~~LOC~~ SOLN: 120.0000 mg | Freq: Once | SUBCUTANEOUS | Status: DC

## 2015-09-13 MED ORDER — GADOBENATE DIMEGLUMINE 529 MG/ML IV SOLN
15.0000 mL | Freq: Once | INTRAVENOUS | Status: AC | PRN
  Administered 2015-09-13: 15 mL via INTRAVENOUS

## 2015-09-13 MED ORDER — GABAPENTIN 300 MG PO CAPS
300.0000 mg | ORAL_CAPSULE | Freq: Three times a day (TID) | ORAL | Status: DC
  Administered 2015-09-13 – 2015-09-17 (×14): 300 mg via ORAL
  Filled 2015-09-13 (×14): qty 1

## 2015-09-13 MED ORDER — DEXAMETHASONE SODIUM PHOSPHATE 10 MG/ML IJ SOLN
10.0000 mg | Freq: Four times a day (QID) | INTRAMUSCULAR | Status: DC
  Administered 2015-09-13 – 2015-09-15 (×8): 10 mg via INTRAVENOUS
  Filled 2015-09-13 (×8): qty 1

## 2015-09-13 NOTE — ED Provider Notes (Signed)
The patient does have a history of cancer, she has had colon stents in the past, she reports that she has had some low back pain and some progressive weakness of her right lower extremity overboard that a week. She reports that she has no fevers, chills, nausea, vomiting. On exam the patient does have normal examinations of the bilateral upper extremities and the cranial nerves III through XII. She has a normal left lower extremity with strength and sensation, her right lower extremity is abnormal, there is normal sensation but there is severe weakness of the right lower extremity at the hip, the knee and the ankle. She has inability to tell position sense at the right great toe. She has no observable reflex of the right patellar tendon, left patellar tendon is normal.  MRI reading pending  At 540 the reading of the MRI was completed and reviewed by myself, I see no hard evidence that there is a lumbar spine lesion. In addition the patient does not have a dermatomal weakness to suggest a focal spinal lesion. Because of this I have discussed the case with Dr. Leonel Ramsay with the neurology service who recommends doing a complete MRI of the spinal cord in the brain to further look for lesions that could be causing this. The patient will need to be admitted, will obtain preoperative labs.  At 7:10 AM - pt signed out to Dr. Dayna Barker to follow up MRI results, reexamine and disposition accordingly.  The pt was updated on her condition  Reexaimined - no change from prior exam.      Noemi Chapel, MD 09/13/15 [phone]

## 2015-09-13 NOTE — ED Notes (Signed)
Attempted report 

## 2015-09-13 NOTE — ED Notes (Signed)
Per [facility] gave fental at 0259 18mcg for pain 5-10 right hip back leg.   VS: 117/72, HR 92, RR12, 99% RA

## 2015-09-13 NOTE — ED Notes (Signed)
Patient transported to MRI 

## 2015-09-13 NOTE — Consult Note (Signed)
Reason for Consult: Right leg weakness brain mass Referring Physician: Emergency department [name] is an 65 y.o. female.  HPI: Patient is a 65 year old female whose history of colon cancer status post colectomy and subsequent chemotherapy back in 2014 who has done well with her screening no recurrence except possibly unknown liver lesion that's been followed over the last week she's had an episode of tingling in her foot that she can emergency room was evaluated for an patient was noted to be neurologically nonfocal and patient was discharged with follow-up with primary care. Over the last week patient is noticed predominantly weakness of the right lower extremities. Patient comes in today with no significant headache or nausea but weakness in her right leg. Workup has revealed a solitary posterior left frontal mass with homogeneous enhancement. This is consistent with metastatic disease. Patient referred to neurosurgery and internal medicine.  Past Medical History  Diagnosis Date  . Colon cancer ([place])     dx'd 2014  . Heart murmur     ? as a child   . Pulmonary embolism ([place]) 11/2014   . Anemia     hx of     Past Surgical History  Procedure Laterality Date  . Abdominal hysterectomy  2005  . Cesarean section  1976  . Flexible sigmoidoscopy N/A 05/25/2013    Procedure: FLEXIBLE SIGMOIDOSCOPY;  Surgeon: [name] Banister, MD;  Location: [facility];  Service: Endoscopy;  Laterality: N/A;  needs floro  . Colonic stent placement N/A 05/25/2013    Procedure: COLONIC STENT PLACEMENT;  Surgeon: [name] Banister, MD;  Location: [facility];  Service: Endoscopy;  Laterality: N/A;  . Portacath placement Right 06/25/2013    Procedure: ULTRA SOUND GUIDED INSERTION PORT-A-CATH;  Surgeon: [name] Hollingshead, MD;  Location: [place];  Service: General;  Laterality: Right;  . Flexible sigmoidoscopy N/A 08/19/2014    Procedure: FLEXIBLE SIGMOIDOSCOPY;  Surgeon: [name] Mayer, MD;  Location: [facility];  Service: Endoscopy;  Laterality: N/A;  . Colonic stent placement N/A 08/19/2014    Procedure: COLONIC STENT PLACEMENT;  Surgeon: [name] Mayer, MD;  Location: [facility];  Service: Endoscopy;  Laterality: N/A;  . Flexible sigmoidoscopy N/A 08/19/2014    Procedure: FLEXIBLE SIGMOIDOSCOPY;  Surgeon: [name] Mayer, MD;  Location: [facility];  Service: Endoscopy;  Laterality: N/A;  . Colonic stent placement N/A 08/19/2014    Procedure: COLONIC STENT PLACEMENT;  Surgeon: [name] Mayer, MD;  Location: [facility];  Service: Endoscopy;  Laterality: N/A;  . Colon resection N/A 10/28/2014    Procedure: Transverse loop colostomy;  Surgeon: [name] Confer, MD;  Location: [facility];  Service: General;  Laterality: N/A;  . Colostomy revision N/A 01/03/2015    Procedure: REVISION OF TRANSVERSE LOOP COLOSTOMY WITH PARTIAL  COLECTOMY;  Surgeon: [name] Confer, MD;  Location: [facility];  Service: General;  Laterality: N/A;  . Laparoscopic low anterior resection  03/06/2015    03/06/2015  . Laparoscopic splenectomy  03/06/2015  . Colon resection N/A 03/06/2015    Procedure: LAPAROSCOPIC takedown loop colostomy ;  Surgeon: [name] Boston, MD;  Location: [facility];  Service: General;  Laterality: N/A;  . Splenectomy, total N/A 03/06/2015    Procedure: SPLENECTOMY;  Surgeon: [name] Boston, MD;  Location: [facility];  Service: General;  Laterality: N/A;  . Bowel resection  03/06/2015    Procedure: LOW ANTERIOR BOWEL RESECTION, rigid proctoscopy;  Surgeon: [name] Boston, MD;  Location: [facility];  Service: General;;  .  Polypectomy  03/06/2015    Procedure: transverse POLYPECTOMY x 4;  Surgeon: [name] Boston, MD;  Location: [facility];  Service: General;;    Family History  Problem Relation Age of Onset  . Colon cancer Sister 40  . Diabetes Neg Hx   . Thyroid disease Neg Hx   . Breast cancer Maternal Aunt   . Prostate cancer Maternal Uncle   . Bone cancer Maternal Grandfather   . Prostate cancer Maternal  Uncle     Social History:  reports that she quit smoking about 44 years ago. Her smoking use included Cigarettes. She quit after 1 year of use. She has never used smokeless tobacco. She reports that she does not drink alcohol or use illicit drugs.  Allergies:  Allergies  Allergen Reactions  . Oxycontin [Oxycodone Hcl] Anaphylaxis, Hives and Itching  . Codeine Itching and Nausea And Vomiting    Medications: I have reviewed the patient's current medications.  Results for orders placed or performed during the hospital encounter of 09/12/15 (from the past 48 hour(s))  CBC with Differential/Platelet     Status: Abnormal   Collection Time: 09/13/15  6:04 AM  Result Value Ref Range   WBC 6.0 4.0 - 10.5 K/uL   RBC 4.00 3.87 - 5.11 MIL/uL   Hemoglobin 10.8 (L) 12.0 - 15.0 g/dL   HCT 33.2 (L) 36.0 - 46.0 %   MCV 83.0 78.0 - 100.0 fL   MCH 27.0 26.0 - 34.0 pg   MCHC 32.5 30.0 - 36.0 g/dL   RDW 14.4 11.5 - 15.5 %   Platelets 360 150 - 400 K/uL   Neutrophils Relative % 55 %   Neutro Abs 3.3 1.7 - 7.7 K/uL   Lymphocytes Relative 28 %   Lymphs Abs 1.7 0.7 - 4.0 K/uL   Monocytes Relative 13 %   Monocytes Absolute 0.8 0.1 - 1.0 K/uL   Eosinophils Relative 4 %   Eosinophils Absolute 0.2 0.0 - 0.7 K/uL   Basophils Relative 0 %   Basophils Absolute 0.0 0.0 - 0.1 K/uL  Basic metabolic panel     Status: Abnormal   Collection Time: 09/13/15  6:04 AM  Result Value Ref Range   Sodium 142 135 - 145 mmol/L   Potassium 4.2 3.5 - 5.1 mmol/L   Chloride 108 101 - 111 mmol/L   CO2 26 22 - 32 mmol/L   Glucose, Bld 102 (H) 65 - 99 mg/dL   BUN 10 6 - 20 mg/dL   Creatinine, Ser 0.78 0.44 - 1.00 mg/dL   Calcium 11.5 (H) 8.9 - 10.3 mg/dL   GFR calc non Af Amer >60 >60 mL/min   GFR calc Af Amer >60 >60 mL/min    Comment: (NOTE) The eGFR has been calculated using the CKD EPI equation. This calculation has not been validated in all clinical situations. eGFR's persistently <60 mL/min signify possible  Chronic Kidney Disease.    Anion gap 8 5 - 15  Protime-INR     Status: Abnormal   Collection Time: 09/13/15  6:04 AM  Result Value Ref Range   Prothrombin Time 20.2 (H) 11.6 - 15.2 seconds   INR 1.72 (H) 0.00 - 1.49  Type and screen     Status: None   Collection Time: 09/13/15  6:09 AM  Result Value Ref Range   ABO/RH(D) AB POS    Antibody Screen NEG    Sample Expiration 09/16/2015     Dg Abd 1 View  09/13/2015  CLINICAL DATA:  Weakness.  Rule out metallic stent. EXAM: ABDOMEN - 1 VIEW COMPARISON:  CT 03/03/2015 FINDINGS: Colonic stent noted 03/03/2015 is no longer visualized. Bowel sutures seen over the pelvis and central abdomen. Moderate stool volume without obstruction. IMPRESSION: No metallic stent over the colon. Electronically Signed   By: Monte Fantasia M.D.   On: 09/13/2015 01:15   Mr [name] ZJ Contrast  09/13/2015  CLINICAL DATA:  Right lower extremity weakness. History of metastatic colon cancer. EXAM: MRI HEAD WITHOUT AND WITH CONTRAST TECHNIQUE: Multiplanar, multiecho pulse sequences of the brain and surrounding structures were obtained without and with intravenous contrast. CONTRAST:  67m MULTIHANCE GADOBENATE DIMEGLUMINE 529 MG/ML IV SOLN COMPARISON:  None. FINDINGS: The study is mildly motion degraded. There is no evidence of acute infarct, intracranial hemorrhage, midline shift, or extra-axial fluid collection. Ventricles and sulci are normal for age. No significant chronic white matter disease is seen. Within the high medial left frontoparietal region, there is a 1.7 x 1.6 x 1.4 cm intra-axial mass with predominantly peripheral enhancement which is thickest along its lateral margin. There is moderate surrounding vasogenic edema with local sulcal effacement. No other enhancing brain lesions are identified. Orbits are unremarkable. A tiny left maxillary sinus mucous retention cyst is noted. The mastoid air cells are clear. Major intracranial vascular flow voids are preserved.  No suspicious skull lesions are identified. IMPRESSION: 1.7 cm left frontoparietal mass with moderate vasogenic edema, most consistent with solitary metastasis. No midline shift. Electronically Signed   By: ALogan BoresM.D.   On: 09/13/2015 08:43   Mr Lumbar Spine Wo Contrast  09/13/2015  CLINICAL DATA:  Initial evaluation for worsening right leg numbness and weakness. EXAM: MRI LUMBAR SPINE WITHOUT CONTRAST TECHNIQUE: Multiplanar, multisequence MR imaging of the lumbar spine was performed. No intravenous contrast was administered. COMPARISON:  Prior study from 09/08/2015. FINDINGS: Transitional lumbosacral anatomy present with partial sacralization of the L5 vertebral body. Mild levoscoliosis of the lumbar spine present. There is trace anterolisthesis of L3 on L4. Vertebral bodies otherwise normally aligned with preservation of the normal lumbar lordosis. Vertebral body heights well preserved. Signal intensity within the vertebral body bone marrow is normal. Benign hemangioma noted within the T12 vertebral body. No other focal osseous lesions. No marrow edema. Conus medullaris terminates normally at the L1 level. Signal intensity within the visualized cord is normal. Nerve roots of the cauda equina within normal limits. Small amount of presacral free fluid noted, as seen on sagittal STIR sequence (series 5, image 7). This is of uncertain significance. No other acute soft tissue abnormality. Small T2 hyperintense cyst present within the right kidney. Gallbladder within normal limits. No pathologically enlarged retroperitoneal lymph nodes identified. Shotty subcentimeter left periaortic nodes noted. L1-2: Minimal annular disc bulge without focal disc herniation. No stenosis. L2-3: Mild diffuse disc bulge with disc desiccation. No focal disc herniation. Small annular fissure posteriorly. Mild facet and ligamentum flavum hypertrophy. No significant canal stenosis. Foramina are widely patent. L3-4: Trace  anterolisthesis of L3 on L4. Mild diffuse disc bulge with disc desiccation. Associated annular fissure present posteriorly. Right greater than left facet arthrosis. 12 mm synovial cyst at the posterior aspect of the right L3-4 facet. There is mild to moderate canal and lateral recess stenosis at this level. Superimposed right foraminal disc protrusion (series 7, image 20). Protruding disc closely approximates the exiting right L3 nerve root in the right neural foramen (series 4, image 5). This could potentially resultant right lower extremity radicular symptoms. Moderate right foraminal  stenosis. No significant left foraminal narrowing. L4-5: Degenerative intervertebral disc space narrowing with endplate changes. Diffuse disc bulge without focal disc herniation. Facet and ligamentum flavum hypertrophy. Mild canal and lateral recess stenosis bilaterally. No significant foraminal narrowing. L5-S1: Transitional lumbosacral anatomy with rudimentary L5-S1 disc. No stenosis. IMPRESSION: 1. Trace anterolisthesis of L3 on L4 with associated disc bulge and superimposed shallow right foraminal disc protrusion. The protruding disc closely approximates the exiting right L3 nerve root, and could potentially result in right lower extremity radicular symptoms. Mild to moderate canal and moderate right foraminal stenosis at this level. 2. Degenerative disc bulge and facet disease at L4-5 with resultant mild canal stenosis. 3. Mild degenerative disc bulge at L2-3 without stenosis or neural impingement. 4. Transitional lumbosacral anatomy with sacralization of the L5 vertebral body. Careful correlation with numbering system on this study as well as plain film radiographs recommended prior to any potential future surgical intervention. 5. Small amount of presacral free fluid/edema, of uncertain significance. Electronically Signed   By: Jeannine Boga M.D.   On: 09/13/2015 04:58   Mr Cervical Spine W Wo Contrast  09/13/2015   CLINICAL DATA:  Worsening right leg numbness weakness. History of metastatic colon cancer. No acute injury or prior relevant surgery. EXAM: MRI CERVICAL SPINE WITHOUT AND WITH CONTRAST; MRI THORACIC SPINE WITHOUT AND WITH CONTRAST TECHNIQUE: Multiplanar and multiecho pulse sequences of the cervical spine, to include the craniocervical junction and cervicothoracic junction, were obtained according to standard protocol without and with intravenous contrast.; Multiplanar and multiecho pulse sequences of the thoracic spine were obtained without and with intravenous contrast. CONTRAST:  71m MULTIHANCE GADOBENATE DIMEGLUMINE 529 MG/ML IV SOLN COMPARISON:  Lumbar MRI same date.  Chest CT 03/03/2015. FINDINGS: Scan quality is mildly degraded by motion, especially on the post-contrast images. CERVICAL SPINE FINDINGS: Alignment: There is 4 mm of anterolisthesis at C7-T1. There is also a minimal anterolisthesis at C6-7. Bones: No acute or suspicious osseous findings. There are facet degenerative changes throughout the cervical spine and mild endplate degenerative changes. Cord: Normal in signal and caliber. No abnormal intradural enhancement. Posterior Fossa: Visualized portions unremarkable. Vertebral Arteries: Bilateral vertebral artery flow voids. Paraspinal tissues: Unremarkable. Disc levels: No significant disc space findings at or above C3-4. C4-5: Loss of disc height with mild uncinate spurring and facet hypertrophy. No cord deformity or foraminal compromise. C5-6: Loss of disc height with posterior osteophytes, uncinate spurring and facet hypertrophy. There is asymmetric mild to moderate right foraminal narrowing. No cord deformity. C6-7: Mild disc bulging, uncinate spurring and facet hypertrophy. No cord deformity or significant foraminal compromise. C7-T1: Advanced facet hypertrophy accounts for the grade 1 anterolisthesis. There is mild disc bulging contributing to mild foraminal narrowing bilaterally. No cord  deformity. THORACIC SPINE FINDINGS: Alignment: Mild scoliosis. Anterolisthesis at C7-T1 as described above. Otherwise normal. Bones: No acute or suspicious osseous findings. No evidence of osseous metastatic disease. T12 hemangioma noted. Cord:  The thoracic cord appears normal in signal and caliber. Paraspinal and other soft tissues: No significant paraspinal abnormalities. Multiple hepatic lesions are partially imaged, corresponding with known hemangiomas and metastatic disease on prior CT. Disc levels: No significant thoracic disc space findings. Specifically, no evidence of disc herniation, spinal stenosis or nerve root encroachment. IMPRESSION: 1. No evidence of metastatic disease within the cervical thoracic spine. 2. Prominent anterolisthesis at C7-T1 due to facet disease. No resulting cord deformity or high-grade foraminal narrowing. 3. Mild additional cervical spondylosis without high-grade spinal stenosis or nerve root encroachment. No significant thoracic  disc space findings. 4. Limited visualization of known hepatic lesions. Electronically Signed   By: Richardean Sale M.D.   On: 09/13/2015 08:53   Mr Thoracic Spine W Wo Contrast  09/13/2015  CLINICAL DATA:  Worsening right leg numbness weakness. History of metastatic colon cancer. No acute injury or prior relevant surgery. EXAM: MRI CERVICAL SPINE WITHOUT AND WITH CONTRAST; MRI THORACIC SPINE WITHOUT AND WITH CONTRAST TECHNIQUE: Multiplanar and multiecho pulse sequences of the cervical spine, to include the craniocervical junction and cervicothoracic junction, were obtained according to standard protocol without and with intravenous contrast.; Multiplanar and multiecho pulse sequences of the thoracic spine were obtained without and with intravenous contrast. CONTRAST:  19m MULTIHANCE GADOBENATE DIMEGLUMINE 529 MG/ML IV SOLN COMPARISON:  Lumbar MRI same date.  Chest CT 03/03/2015. FINDINGS: Scan quality is mildly degraded by motion, especially on the  post-contrast images. CERVICAL SPINE FINDINGS: Alignment: There is 4 mm of anterolisthesis at C7-T1. There is also a minimal anterolisthesis at C6-7. Bones: No acute or suspicious osseous findings. There are facet degenerative changes throughout the cervical spine and mild endplate degenerative changes. Cord: Normal in signal and caliber. No abnormal intradural enhancement. Posterior Fossa: Visualized portions unremarkable. Vertebral Arteries: Bilateral vertebral artery flow voids. Paraspinal tissues: Unremarkable. Disc levels: No significant disc space findings at or above C3-4. C4-5: Loss of disc height with mild uncinate spurring and facet hypertrophy. No cord deformity or foraminal compromise. C5-6: Loss of disc height with posterior osteophytes, uncinate spurring and facet hypertrophy. There is asymmetric mild to moderate right foraminal narrowing. No cord deformity. C6-7: Mild disc bulging, uncinate spurring and facet hypertrophy. No cord deformity or significant foraminal compromise. C7-T1: Advanced facet hypertrophy accounts for the grade 1 anterolisthesis. There is mild disc bulging contributing to mild foraminal narrowing bilaterally. No cord deformity. THORACIC SPINE FINDINGS: Alignment: Mild scoliosis. Anterolisthesis at C7-T1 as described above. Otherwise normal. Bones: No acute or suspicious osseous findings. No evidence of osseous metastatic disease. T12 hemangioma noted. Cord:  The thoracic cord appears normal in signal and caliber. Paraspinal and other soft tissues: No significant paraspinal abnormalities. Multiple hepatic lesions are partially imaged, corresponding with known hemangiomas and metastatic disease on prior CT. Disc levels: No significant thoracic disc space findings. Specifically, no evidence of disc herniation, spinal stenosis or nerve root encroachment. IMPRESSION: 1. No evidence of metastatic disease within the cervical thoracic spine. 2. Prominent anterolisthesis at C7-T1 due to  facet disease. No resulting cord deformity or high-grade foraminal narrowing. 3. Mild additional cervical spondylosis without high-grade spinal stenosis or nerve root encroachment. No significant thoracic disc space findings. 4. Limited visualization of known hepatic lesions. Electronically Signed   By: WRichardean SaleM.D.   On: 09/13/2015 08:53   Dg Foot Complete Right  09/12/2015  CLINICAL DATA:  Right foot numbness. Recent falls. Numbness in the right lower extremity. EXAM: RIGHT FOOT COMPLETE - 3+ VIEW COMPARISON:  None. FINDINGS: There is no evidence of fracture or dislocation. There is no evidence of arthropathy or other focal bone abnormality. Soft tissues are unremarkable. Prominent calcification and spurring is noted at the attachment of the Achilles tendon. IMPRESSION: 1. No acute abnormality. 2. Prominent calcification and spurring at the attachment of the Achilles tendon. Electronically Signed   By: CSan MorelleM.D.   On: 09/12/2015 15:15    Review of Systems  Constitutional: Negative.   Eyes: Negative.   Cardiovascular: Negative.   Musculoskeletal: Positive for myalgias.  Skin: Negative.   Neurological: Positive for tingling, sensory change  and headaches.   Blood pressure 139/87, pulse 98, temperature 98.1 F (36.7 C), temperature source Oral, resp. rate 16, SpO2 100 %. Physical Exam  Constitutional: She is oriented to person, place, and time. She appears well-developed.  Eyes: Conjunctivae are normal. Pupils are equal, round, and reactive to light.  Neck: Normal range of motion.  Respiratory: Effort normal.  GI: Soft. Bowel sounds are normal.  Neurological: She is alert and oriented to person, place, and time. GCS eye subscore is 4. GCS verbal subscore is 5. GCS motor subscore is 6.  Patient is awake and alert oriented 4 pupils are equal and reactive cranial nerves are intact. Strength in her upper cavity is 5 out of 5 bilaterally strength in her lower extremities is 5  out of 5 in the left leg in her iliopsoas, quads, hamstrings, gastric, and tibialis, and EHL. Right looks to me has diffuse weakness at 4 out of 5 in all the same muscle groups.    Assessment/Plan: 65 year old female with history of colon cancer with what looks like a solitary posterior left frontal mass consistent with metastatic colon cancer. The lesion is a little over 1.5 cm and appears to be located either within or adjacent to the motor strip. Due to the size and location of the lesion surgery has moderate risk of permanent deficit with weakness in her right lower extremity. I have gone over the options of stereotactic radiosurgery with and without surgery explaining the risks of surgery in this location and she understands and we have elected to refer the patient for [facility]. I've discussed this with Dr. Kyung Rudd who will also evaluate the patient this weekend. Patient will be admitted to medicine will complete her metastatic workup per the primary service and I will continue to follow while in hospital. Patient should remain on Decadron 10 mg IV every 6 for the next 24 hours and we can slowly wean down the dose.  [name] [name] 09/13/2015, 10:20 AM

## 2015-09-13 NOTE — ED Notes (Signed)
Pt in MRI.

## 2015-09-13 NOTE — H&P (Addendum)
[facility] History and Physical  [name] [name] [id number] DOB: April 23, 1951    PCP:   [facility]   Chief Complaint: right leg weakness, paresthesia, and ataxia.   HPI: [name] [name] is an 65 y.o. female with hx of colon CA, prior hx of PE on Xarelto, hx of anemia, lives at home with husband, presented to [facility] ER with right leg paresthesia and weakness.  She also has had ataxia and has been falling.  She has no HA, fever, chills.  Work up in the ER included an MRI which so vasogenic edema and a lesion suspicious for metastatic brain lesion.  Her labs were unremarkable, except for Calcium of 11.  Neurosurgery was consulted, and Dr Saintclair Halsted recommended radiation therapy and metastatic work up.  He also recommneded IV Steroid at 10mg  of Decadron IV Q 6 hours.  Offer was made for her to be transferred to [facility] as oncology is more specialized there, but she would like to be admitted here.  She is amenable to be transferred there if required.  Rewiew of Systems:  Constitutional: Negative for malaise, fever and chills. No significant weight loss or weight gain Eyes: Negative for eye pain, redness and discharge, diplopia, visual changes, or flashes of light. ENMT: Negative for ear pain, hoarseness, nasal congestion, sinus pressure and sore throat. No headaches; tinnitus, drooling, or problem swallowing. Cardiovascular: Negative for chest pain, palpitations, diaphoresis, dyspnea and peripheral edema. ; No orthopnea, PND Respiratory: Negative for cough, hemoptysis, wheezing and stridor. No pleuritic chestpain. Gastrointestinal: Negative for nausea, vomiting, diarrhea, constipation, abdominal pain, melena, blood in stool, hematemesis, jaundice and rectal bleeding.    Genitourinary: Negative for frequency, dysuria, incontinence,flank pain and hematuria; Musculoskeletal: Negative for back pain and neck pain. Negative for swelling and trauma.;  Skin: . Negative for pruritus, rash, abrasions,  bruising and skin lesion.; ulcerations Neuro: Negative for headache, lightheadedness and neck stiffness. Negative for weakness, altered level of consciousness , altered mental status, extremity weakness, burning feet, involuntary movement, seizure and syncope.  Psych: negative for anxiety, depression, insomnia, tearfulness, panic attacks, hallucinations, paranoia, suicidal or homicidal ideation    Past Medical History  Diagnosis Date  . Colon cancer ([place])     dx'd 2014  . Heart murmur     ? as a child   . Pulmonary embolism ([place]) 11/2014   . Anemia     hx of     Past Surgical History  Procedure Laterality Date  . Abdominal hysterectomy  2005  . Cesarean section  1976  . Flexible sigmoidoscopy N/A 05/25/2013    Procedure: FLEXIBLE SIGMOIDOSCOPY;  Surgeon: [name], MD;  Location: [facility];  Service: Endoscopy;  Laterality: N/A;  needs floro  . Colonic stent placement N/A 05/25/2013    Procedure: COLONIC STENT PLACEMENT;  Surgeon: [name], MD;  Location: [facility];  Service: Endoscopy;  Laterality: N/A;  . Portacath placement Right 06/25/2013    Procedure: ULTRA SOUND GUIDED INSERTION PORT-A-CATH;  Surgeon: [name], MD;  Location: [facility];  Service: General;  Laterality: Right;  . Flexible sigmoidoscopy N/A 08/19/2014    Procedure: FLEXIBLE SIGMOIDOSCOPY;  Surgeon: Gatha Mayer, MD;  Location: [facility];  Service: Endoscopy;  Laterality: N/A;  . Colonic stent placement N/A 08/19/2014    Procedure: COLONIC STENT PLACEMENT;  Surgeon: Gatha Mayer, MD;  Location: [facility];  Service: Endoscopy;  Laterality: N/A;  . Flexible sigmoidoscopy N/A 08/19/2014    Procedure: FLEXIBLE SIGMOIDOSCOPY;  Surgeon: Gatha Mayer, MD;  Location: [facility];  Service: Endoscopy;  Laterality: N/A;  . Colonic stent placement N/A 08/19/2014    Procedure: COLONIC STENT PLACEMENT;  Surgeon: Gatha Mayer, MD;  Location: [facility];  Service:  Endoscopy;  Laterality: N/A;  . Colon resection N/A 10/28/2014    Procedure: Transverse loop colostomy;  Surgeon: [name], MD;  Location: [facility];  Service: General;  Laterality: N/A;  . Colostomy revision N/A 01/03/2015    Procedure: REVISION OF TRANSVERSE LOOP COLOSTOMY WITH PARTIAL  COLECTOMY;  Surgeon: [name], MD;  Location: [facility];  Service: General;  Laterality: N/A;  . Laparoscopic low anterior resection  03/06/2015    03/06/2015  . Laparoscopic splenectomy  03/06/2015  . Colon resection N/A 03/06/2015    Procedure: LAPAROSCOPIC takedown loop colostomy ;  Surgeon: Michael Boston, MD;  Location: [facility];  Service: General;  Laterality: N/A;  . 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 Bowel resection  03/06/2015    Procedure: LOW ANTERIOR BOWEL RESECTION, rigid proctoscopy;  Surgeon: Michael Boston, MD;  Location: [facility];  Service: General;;  . Polypectomy  03/06/2015    Procedure: transverse POLYPECTOMY x 4;  Surgeon: Michael Boston, MD;  Location: [facility];  Service: General;;    Medications:  HOME MEDS: Prior to Admission medications   Medication Sig Start Date End Date Taking? Authorizing Provider  acetaminophen (TYLENOL) 500 MG tablet Take 500 mg by mouth every 6 (six) hours as needed for moderate pain or headache.    Yes Historical Provider, MD  Cyanocobalamin (VITAMIN B 12 PO) Take 1 tablet by mouth every morning.   Yes Historical Provider, MD  dextromethorphan (DELSYM) 30 MG/5ML liquid Take 10 mLs by mouth every 12 (twelve) hours as needed for cough.   Yes Historical Provider, MD  diphenhydrAMINE (BENADRYL) 25 MG tablet Take 25 mg by mouth every 6 (six) hours as needed (cough).   Yes Historical Provider, MD  gabapentin (NEURONTIN) 300 MG capsule Take 300 mg by mouth 3 (three) times daily. 09/03/15  Yes Historical Provider, MD  IRON PO Take 1 tablet by mouth every morning.   Yes  Historical Provider, MD  polyethylene glycol powder (GLYCOLAX/MIRALAX) powder TAKE 34 G (2 DOSES) BY MOUTH DAILY AS NEEDED FOR MILD CONSTIPATION OR MODERATE CONSTIPATION 03/03/14  Yes Concha Norway, MD  rivaroxaban (XARELTO) 20 MG TABS tablet Take 1 tablet (20 mg total) by mouth daily with supper. 06/20/15  Yes Truitt Merle, MD  lidocaine-prilocaine (EMLA) cream Apply 1 tsp to site one hour before use, cover with plastic wrap. 03/31/15   Truitt Merle, MD     Allergies:  Allergies  Allergen Reactions  . Oxycontin [Oxycodone Hcl] Anaphylaxis, Hives and Itching  . Codeine Itching and Nausea And Vomiting    Social History:   reports that she quit smoking about 44 years ago. Her smoking use included Cigarettes. She quit after 1 year of use. She has never used smokeless tobacco. She reports that she does not drink alcohol or use illicit drugs.  Family History: Family History  Problem Relation Age of Onset  . Colon cancer Sister 65  . Diabetes Neg Hx   . Thyroid disease Neg Hx   . Breast cancer Maternal Aunt   . Prostate cancer Maternal Uncle   . Bone cancer Maternal Grandfather   . Prostate cancer Maternal Uncle  Physical Exam: Filed Vitals:   09/13/15 0900 09/13/15 0930 09/13/15 1000 09/13/15 1049  BP: 115/76 139/87 131/55 138/94  Pulse: 94 98 103 98  Temp:    97.7 F (36.5 C)  TempSrc:    Oral  Resp: 16 16 16 17   SpO2: 99% 100% 100% 100%   Blood pressure 138/94, pulse 98, temperature 97.7 F (36.5 C), temperature source Oral, resp. rate 17, SpO2 100 %.  GEN:  Pleasant  patient lying in the stretcher in no acute distress; cooperative with exam. PSYCH:  alert and oriented x4; does not appear anxious or depressed; affect is appropriate. HEENT: Mucous membranes pink and anicteric; PERRLA; EOM intact; no cervical lymphadenopathy nor thyromegaly or carotid bruit; no JVD; There were no stridor. Neck is very supple. Breasts:: Not examined CHEST WALL: No tenderness CHEST: Normal  respiration, clear to auscultation bilaterally.  HEART: Regular rate and rhythm.  There are no murmur, rub, or gallops.   BACK: No kyphosis or scoliosis; no CVA tenderness ABDOMEN: soft and non-tender; no masses, no organomegaly, normal abdominal bowel sounds; no pannus; no intertriginous candida. There is no rebound and no distention. Rectal Exam: Not done EXTREMITIES: No bone or joint deformity; age-appropriate arthropathy of the hands and knees; no edema; no ulcerations.  There is no calf tenderness. Genitalia: not examined PULSES: 2+ and symmetric SKIN: Normal hydration no rash or ulceration CNS: Cranial nerves 2-12 grossly intact no focal lateralizing neurologic deficit.  Speech is fluent; uvula elevated with phonation, facial symmetry and tongue midline. DTR are normal bilaterally, cerebella exam is intact, barbinski is negative and strengths are equaled bilaterally.  No sensory loss.   Labs on Admission:  Basic Metabolic Panel:  Recent Labs Lab 09/13/15 0604  NA 142  K 4.2  CL 108  CO2 26  GLUCOSE 102*  BUN 10  CREATININE 0.78  CALCIUM 11.5*   CBC:  Recent Labs Lab 09/13/15 0604  WBC 6.0  NEUTROABS 3.3  HGB 10.8*  HCT 33.2*  MCV 83.0  PLT 360     Radiological Exams on Admission: Dg Abd 1 View  09/13/2015  CLINICAL DATA:  Weakness.  Rule out metallic stent. EXAM: ABDOMEN - 1 VIEW COMPARISON:  CT 03/03/2015 FINDINGS: Colonic stent noted 03/03/2015 is no longer visualized. Bowel sutures seen over the pelvis and central abdomen. Moderate stool volume without obstruction. IMPRESSION: No metallic stent over the colon. Electronically Signed   By: Monte Fantasia M.D.   On: 09/13/2015 01:15   Mr [name] [id number] Contrast  09/13/2015  CLINICAL DATA:  Right lower extremity weakness. History of metastatic colon cancer. EXAM: MRI HEAD WITHOUT AND WITH CONTRAST TECHNIQUE: Multiplanar, multiecho pulse sequences of the brain and surrounding structures were obtained without and with  intravenous contrast. CONTRAST:  58mL MULTIHANCE GADOBENATE DIMEGLUMINE 529 MG/ML IV SOLN COMPARISON:  None. FINDINGS: The study is mildly motion degraded. There is no evidence of acute infarct, intracranial hemorrhage, midline shift, or extra-axial fluid collection. Ventricles and sulci are normal for age. No significant chronic white matter disease is seen. Within the high medial left frontoparietal region, there is a 1.7 x 1.6 x 1.4 cm intra-axial mass with predominantly peripheral enhancement which is thickest along its lateral margin. There is moderate surrounding vasogenic edema with local sulcal effacement. No other enhancing brain lesions are identified. Orbits are unremarkable. A tiny left maxillary sinus mucous retention cyst is noted. The mastoid air cells are clear. Major intracranial vascular flow voids are preserved. No suspicious skull lesions are identified.  IMPRESSION: 1.7 cm left frontoparietal mass with moderate vasogenic edema, most consistent with solitary metastasis. No midline shift. Electronically Signed   By: Logan Bores M.D.   On: 09/13/2015 08:43   Mr Lumbar Spine Wo Contrast  09/13/2015  CLINICAL DATA:  Initial evaluation for worsening right leg numbness and weakness. EXAM: MRI LUMBAR SPINE WITHOUT CONTRAST TECHNIQUE: Multiplanar, multisequence MR imaging of the lumbar spine was performed. No intravenous contrast was administered. COMPARISON:  Prior study from 09/08/2015. FINDINGS: Transitional lumbosacral anatomy present with partial sacralization of the L5 vertebral body. Mild levoscoliosis of the lumbar spine present. There is trace anterolisthesis of L3 on L4. Vertebral bodies otherwise normally aligned with preservation of the normal lumbar lordosis. Vertebral body heights well preserved. Signal intensity within the vertebral body bone marrow is normal. Benign hemangioma noted within the T12 vertebral body. No other focal osseous lesions. No marrow edema. Conus medullaris  terminates normally at the L1 level. Signal intensity within the visualized cord is normal. Nerve roots of the cauda equina within normal limits. Small amount of presacral free fluid noted, as seen on sagittal STIR sequence (series 5, image 7). This is of uncertain significance. No other acute soft tissue abnormality. Small T2 hyperintense cyst present within the right kidney. Gallbladder within normal limits. No pathologically enlarged retroperitoneal lymph nodes identified. Shotty subcentimeter left periaortic nodes noted. L1-2: Minimal annular disc bulge without focal disc herniation. No stenosis. L2-3: Mild diffuse disc bulge with disc desiccation. No focal disc herniation. Small annular fissure posteriorly. Mild facet and ligamentum flavum hypertrophy. No significant canal stenosis. Foramina are widely patent. L3-4: Trace anterolisthesis of L3 on L4. Mild diffuse disc bulge with disc desiccation. Associated annular fissure present posteriorly. Right greater than left facet arthrosis. 12 mm synovial cyst at the posterior aspect of the right L3-4 facet. There is mild to moderate canal and lateral recess stenosis at this level. Superimposed right foraminal disc protrusion (series 7, image 20). Protruding disc closely approximates the exiting right L3 nerve root in the right neural foramen (series 4, image 5). This could potentially resultant right lower extremity radicular symptoms. Moderate right foraminal stenosis. No significant left foraminal narrowing. L4-5: Degenerative intervertebral disc space narrowing with endplate changes. Diffuse disc bulge without focal disc herniation. Facet and ligamentum flavum hypertrophy. Mild canal and lateral recess stenosis bilaterally. No significant foraminal narrowing. L5-S1: Transitional lumbosacral anatomy with rudimentary L5-S1 disc. No stenosis. IMPRESSION: 1. Trace anterolisthesis of L3 on L4 with associated disc bulge and superimposed shallow right foraminal disc  protrusion. The protruding disc closely approximates the exiting right L3 nerve root, and could potentially result in right lower extremity radicular symptoms. Mild to moderate canal and moderate right foraminal stenosis at this level. 2. Degenerative disc bulge and facet disease at L4-5 with resultant mild canal stenosis. 3. Mild degenerative disc bulge at L2-3 without stenosis or neural impingement. 4. Transitional lumbosacral anatomy with sacralization of the L5 vertebral body. Careful correlation with numbering system on this study as well as plain film radiographs recommended prior to any potential future surgical intervention. 5. Small amount of presacral free fluid/edema, of uncertain significance. Electronically Signed   By: Jeannine Boga M.D.   On: 09/13/2015 04:58   Mr Cervical Spine W Wo Contrast  09/13/2015  CLINICAL DATA:  Worsening right leg numbness weakness. History of metastatic colon cancer. No acute injury or prior relevant surgery. EXAM: MRI CERVICAL SPINE WITHOUT AND WITH CONTRAST; MRI THORACIC SPINE WITHOUT AND WITH CONTRAST TECHNIQUE: Multiplanar and multiecho pulse  sequences of the cervical spine, to include the craniocervical junction and cervicothoracic junction, were obtained according to standard protocol without and with intravenous contrast.; Multiplanar and multiecho pulse sequences of the thoracic spine were obtained without and with intravenous contrast. CONTRAST:  53mL MULTIHANCE GADOBENATE DIMEGLUMINE 529 MG/ML IV SOLN COMPARISON:  Lumbar MRI same date.  Chest CT 03/03/2015. FINDINGS: Scan quality is mildly degraded by motion, especially on the post-contrast images. CERVICAL SPINE FINDINGS: Alignment: There is 4 mm of anterolisthesis at C7-T1. There is also a minimal anterolisthesis at C6-7. Bones: No acute or suspicious osseous findings. There are facet degenerative changes throughout the cervical spine and mild endplate degenerative changes. Cord: Normal in signal and  caliber. No abnormal intradural enhancement. Posterior Fossa: Visualized portions unremarkable. Vertebral Arteries: Bilateral vertebral artery flow voids. Paraspinal tissues: Unremarkable. Disc levels: No significant disc space findings at or above C3-4. C4-5: Loss of disc height with mild uncinate spurring and facet hypertrophy. No cord deformity or foraminal compromise. C5-6: Loss of disc height with posterior osteophytes, uncinate spurring and facet hypertrophy. There is asymmetric mild to moderate right foraminal narrowing. No cord deformity. C6-7: Mild disc bulging, uncinate spurring and facet hypertrophy. No cord deformity or significant foraminal compromise. C7-T1: Advanced facet hypertrophy accounts for the grade 1 anterolisthesis. There is mild disc bulging contributing to mild foraminal narrowing bilaterally. No cord deformity. THORACIC SPINE FINDINGS: Alignment: Mild scoliosis. Anterolisthesis at C7-T1 as described above. Otherwise normal. Bones: No acute or suspicious osseous findings. No evidence of osseous metastatic disease. T12 hemangioma noted. Cord:  The thoracic cord appears normal in signal and caliber. Paraspinal and other soft tissues: No significant paraspinal abnormalities. Multiple hepatic lesions are partially imaged, corresponding with known hemangiomas and metastatic disease on prior CT. Disc levels: No significant thoracic disc space findings. Specifically, no evidence of disc herniation, spinal stenosis or nerve root encroachment. IMPRESSION: 1. No evidence of metastatic disease within the cervical thoracic spine. 2. Prominent anterolisthesis at C7-T1 due to facet disease. No resulting cord deformity or high-grade foraminal narrowing. 3. Mild additional cervical spondylosis without high-grade spinal stenosis or nerve root encroachment. No significant thoracic disc space findings. 4. Limited visualization of known hepatic lesions. Electronically Signed   By: Richardean Sale M.D.   On:  09/13/2015 08:53   Mr Thoracic Spine W Wo Contrast  09/13/2015  CLINICAL DATA:  Worsening right leg numbness weakness. History of metastatic colon cancer. No acute injury or prior relevant surgery. EXAM: MRI CERVICAL SPINE WITHOUT AND WITH CONTRAST; MRI THORACIC SPINE WITHOUT AND WITH CONTRAST TECHNIQUE: Multiplanar and multiecho pulse sequences of the cervical spine, to include the craniocervical junction and cervicothoracic junction, were obtained according to standard protocol without and with intravenous contrast.; Multiplanar and multiecho pulse sequences of the thoracic spine were obtained without and with intravenous contrast. CONTRAST:  49mL MULTIHANCE GADOBENATE DIMEGLUMINE 529 MG/ML IV SOLN COMPARISON:  Lumbar MRI same date.  Chest CT 03/03/2015. FINDINGS: Scan quality is mildly degraded by motion, especially on the post-contrast images. CERVICAL SPINE FINDINGS: Alignment: There is 4 mm of anterolisthesis at C7-T1. There is also a minimal anterolisthesis at C6-7. Bones: No acute or suspicious osseous findings. There are facet degenerative changes throughout the cervical spine and mild endplate degenerative changes. Cord: Normal in signal and caliber. No abnormal intradural enhancement. Posterior Fossa: Visualized portions unremarkable. Vertebral Arteries: Bilateral vertebral artery flow voids. Paraspinal tissues: Unremarkable. Disc levels: No significant disc space findings at or above C3-4. C4-5: Loss of disc height with mild uncinate spurring  and facet hypertrophy. No cord deformity or foraminal compromise. C5-6: Loss of disc height with posterior osteophytes, uncinate spurring and facet hypertrophy. There is asymmetric mild to moderate right foraminal narrowing. No cord deformity. C6-7: Mild disc bulging, uncinate spurring and facet hypertrophy. No cord deformity or significant foraminal compromise. C7-T1: Advanced facet hypertrophy accounts for the grade 1 anterolisthesis. There is mild disc bulging  contributing to mild foraminal narrowing bilaterally. No cord deformity. THORACIC SPINE FINDINGS: Alignment: Mild scoliosis. Anterolisthesis at C7-T1 as described above. Otherwise normal. Bones: No acute or suspicious osseous findings. No evidence of osseous metastatic disease. T12 hemangioma noted. Cord:  The thoracic cord appears normal in signal and caliber. Paraspinal and other soft tissues: No significant paraspinal abnormalities. Multiple hepatic lesions are partially imaged, corresponding with known hemangiomas and metastatic disease on prior CT. Disc levels: No significant thoracic disc space findings. Specifically, no evidence of disc herniation, spinal stenosis or nerve root encroachment. IMPRESSION: 1. No evidence of metastatic disease within the cervical thoracic spine. 2. Prominent anterolisthesis at C7-T1 due to facet disease. No resulting cord deformity or high-grade foraminal narrowing. 3. Mild additional cervical spondylosis without high-grade spinal stenosis or nerve root encroachment. No significant thoracic disc space findings. 4. Limited visualization of known hepatic lesions. Electronically Signed   By: Richardean Sale M.D.   On: 09/13/2015 08:53   Dg Foot Complete Right  09/12/2015  CLINICAL DATA:  Right foot numbness. Recent falls. Numbness in the right lower extremity. EXAM: RIGHT FOOT COMPLETE - 3+ VIEW COMPARISON:  None. FINDINGS: There is no evidence of fracture or dislocation. There is no evidence of arthropathy or other focal bone abnormality. Soft tissues are unremarkable. Prominent calcification and spurring is noted at the attachment of the Achilles tendon. IMPRESSION: 1. No acute abnormality. 2. Prominent calcification and spurring at the attachment of the Achilles tendon. Electronically Signed   By: San Morelle M.D.   On: 09/12/2015 15:15    EKG: Independently reviewed.    Assessment/Plan Present on Admission:  . Brain metastases ([place]) . Hypercalcemia . Right  leg weakness . Brain metastasis ([place])  PLAN:  Metastatic brain tumor:  Will continue with IV Decadron.  Will obtain PT.  She will likely need radiation therapy.  Will likely need oncology consultation.  Hypercalcemia:  The level was not signicantly high.  Delton See was exclusive for outpatient, so will give SQ Cacitonin, along with IVF and IV Lasix.  Will follow level.  Hx of PE:  Will continue with anticoagulation.  Neurosurgery did not feel she needs any surgical intervention.    Other plans as per orders. Code Status:   Orvan Falconer, MD. FACP [facility] Pager [phone] 7pm to 7am.  09/13/2015, 11:08 AM

## 2015-09-13 NOTE — ED Provider Notes (Signed)
9:50 AM Assumed care from Dr. Sabra Heck who received patient from [facility], provider: Delrae Rend, PA-C, please see their note for full history, physical and decision making until this point. In brief this is a 65 y.o. year old female who presented to the ED tonight with Fall and Extremity Weakness     Checked out as a 65 year old female with a recent history of colon cancer status post resection who presents with approximately week and half progressively worsening paresthesias or weakness in her right lower extremity to the point where she cannot walk now. She is waiting MRIs for evaluation. MRI of her brain shows that she has a 1.6 cm left frontoparietal mass is likely the cause of her symptoms. There is some surrounding vasogenic edema so given 10 mg of Decadron and consult to neurosurgery. I suspect that she may be a neurosurgical candidate secondary to the solitary mass however will likely still be a hospitalist admission secondary to the metastatic cancer which will need to be worked up further.  Hospitalist admission, NSG and Rad/Onc following. Discussed with triad who will admit and workup further.   Labs, studies and imaging reviewed by myself and considered in medical decision making if ordered. Imaging interpreted by radiology.  Labs Reviewed  CBC WITH DIFFERENTIAL/PLATELET - Abnormal; Notable for the following:    Hemoglobin 10.8 (*)    HCT 33.2 (*)    All other components within normal limits  BASIC METABOLIC PANEL - Abnormal; Notable for the following:    Glucose, Bld 102 (*)    Calcium 11.5 (*)    All other components within normal limits  PROTIME-INR - Abnormal; Notable for the following:    Prothrombin Time 20.2 (*)    INR 1.72 (*)    All other components within normal limits  TYPE AND SCREEN  ABO/RH    MR Cervical Spine W Wo Contrast  Final Result    MR Thoracic Spine W Wo Contrast  Final Result    MR Brain W Wo Contrast  Final Result    MR Lumbar Spine Wo Contrast   Final Result    DG Abd 1 View  Final Result    DG Foot Complete Right  Final Result      No Follow-up on file.   Merrily Pew, MD 09/14/15 [phone]

## 2015-09-13 NOTE — Progress Notes (Signed)
Patient having difficulty with moving right lower extremity, and has begun to drop items from right hand. She was insistent upon using the walker and picking up right leg with hand to ambulate to bathroom. I explained it will be to unsafe for patient and one assist to do this until patient is more stable with movements. Patient would need 2 assist and or lift for movement. Egress test was completed.

## 2015-09-14 LAB — COMPREHENSIVE METABOLIC PANEL
ALBUMIN: 3.7 g/dL (ref 3.5–5.0)
ALK PHOS: 50 U/L (ref 38–126)
ALT: 15 U/L (ref 14–54)
ANION GAP: 11 (ref 5–15)
AST: 23 U/L (ref 15–41)
BILIRUBIN TOTAL: 0.2 mg/dL — AB (ref 0.3–1.2)
BUN: 10 mg/dL (ref 6–20)
CALCIUM: 11.1 mg/dL — AB (ref 8.9–10.3)
CO2: 19 mmol/L — ABNORMAL LOW (ref 22–32)
Chloride: 110 mmol/L (ref 101–111)
Creatinine, Ser: 0.65 mg/dL (ref 0.44–1.00)
GFR calc Af Amer: >60 mL/min (ref >60)
GFR calc non Af Amer: >60 mL/min (ref >60)
Glucose, Bld: 147 mg/dL — ABNORMAL HIGH (ref 65–99)
POTASSIUM: 4.2 mmol/L (ref 3.5–5.1)
Sodium: 140 mmol/L (ref 135–145)
TOTAL PROTEIN: 7.5 g/dL (ref 6.5–8.1)

## 2015-09-14 LAB — CALCIUM: Calcium: 11.5 mg/dL — ABNORMAL HIGH (ref 8.9–10.3)

## 2015-09-14 LAB — TYPE AND SCREEN
ABO/RH(D): AB POS
ANTIBODY SCREEN: NEGATIVE

## 2015-09-14 MED ORDER — IOHEXOL 300 MG/ML  SOLN: 25.0000 mL | Freq: Once | INTRAMUSCULAR | Status: DC | PRN

## 2015-09-14 MED ORDER — SODIUM CHLORIDE 0.9 % IV SOLN
INTRAVENOUS | Status: DC
  Administered 2015-09-14 – 2015-09-15 (×4): via INTRAVENOUS

## 2015-09-14 MED ORDER — FUROSEMIDE 10 MG/ML IJ SOLN
20.0000 mg | Freq: Four times a day (QID) | INTRAMUSCULAR | Status: AC
  Administered 2015-09-14 (×3): 20 mg via INTRAVENOUS
  Filled 2015-09-14 (×3): qty 2

## 2015-09-14 NOTE — Progress Notes (Addendum)
Rehab Admissions Coordinator Note:  Patient was screened by Cleatrice Burke for appropriateness for an Inpatient Acute Rehab Consult per PT recommendation. Noted radiation pending this week..Would recommend an inpt rehab consult after. will follow.  Cleatrice Burke 09/14/2015, 2:43 PM  I can be reached at [phone].

## 2015-09-14 NOTE — Evaluation (Signed)
Physical Therapy Evaluation Patient Details Name: [name] [name] MRN: [id number] DOB: 05/22/1951 Today's Date: 09/14/2015   History of Present Illness  65 y.o. female with hx of colon CA, prior hx of PE on Xarelto, hx of anemia, lives at home with husband, presented with right leg paresthesia and weakness; Workup has revealed a solitary posterior left frontal mass.  Clinical Impression  Patient demonstrates deficits in functional mobility as indicated below. Will need continued skilled PT to address deficits and maximize function. Will see as indicated and progress as tolerated.  At this time, feel patient would benefit from [facility] consult for potential rehabilitation post acute stay.    Follow Up Recommendations CIR;Supervision for mobility/OOB    Equipment Recommendations   (TBD)    Recommendations for Other Services Rehab consult     Precautions / Restrictions Precautions Precautions: Fall      Mobility  Bed Mobility Overal bed mobility: Needs Assistance Bed Mobility: Supine to Sit     Supine to sit: Min assist     General bed mobility comments: min assist to move RLE to EOB.  Transfers Overall transfer level: Needs assistance Equipment used: Rolling walker (2 wheeled) Transfers: Sit to/from [facility] Sit to Stand: Min assist;+2 physical assistance Stand pivot transfers: Mod assist       General transfer comment: RLE buckling, cues for quad setting during pivotal steps to chair. manual assist to maintain RUE grip on RW. Cues for upright posture and sequencing. Assist for stability  Ambulation/Gait                Stairs            Wheelchair Mobility    Modified Rankin (Stroke Patients Only)       Balance Overall balance assessment: History of Falls (has had a few falls recently due to RLE giving out)                                           Pertinent Vitals/Pain Pain Assessment: No/denies pain    Home  Living Family/patient expects to be discharged to:: Private residence Living Arrangements: Spouse/significant other Available Help at Discharge: Family;Available PRN/intermittently (husband works) Type of Home: House       Home Layout: One level Home Equipment: Cane - single point      Prior Function Level of Independence: Independent with assistive device(s)         Comments: independent with cane prior to last 2 wks     Hand Dominance   Dominant Hand: Right    Extremity/Trunk Assessment   Upper Extremity Assessment: RUE deficits/detail RUE Deficits / Details: Right inattention with UE/LE         Lower Extremity Assessment: RLE deficits/detail RLE Deficits / Details: RLE weakness noted 3/5, unable to complete range against gravity, poor quad support       Communication      Cognition Arousal/Alertness: Awake/alert Behavior During Therapy: WFL for tasks assessed/performed Overall Cognitive Status: Impaired/Different from baseline Area of Impairment: Attention;Safety/judgement;Awareness;Problem solving   Current Attention Level: Sustained     Safety/Judgement: Decreased awareness of safety;Decreased awareness of deficits Awareness: Intellectual Problem Solving: Decreased initiation;Requires verbal cues;Requires tactile cues General Comments: Patient with noted inattention to the right side, able to respond to cues     General Comments General comments (skin integrity, edema, etc.): Performed EOB trunk control activities, RLE  assessment activities. Cues for attention to right side required intermittently during tasks.    Exercises        Assessment/Plan    PT Assessment Patient needs continued PT services  PT Diagnosis Difficulty walking;Abnormality of gait   PT Problem List Decreased strength;Decreased activity tolerance;Decreased balance;Decreased mobility;Decreased coordination;Decreased cognition;Decreased safety awareness;Impaired sensation  PT  Treatment Interventions DME instruction;Gait training;Functional mobility training;Therapeutic activities;Therapeutic exercise;Balance training;Patient/family education;Cognitive remediation   PT Goals (Current goals can be found in the Care Plan section) Acute Rehab PT Goals Patient Stated Goal: to get better PT Goal Formulation: With patient Time For Goal Achievement: 09/28/15 Potential to Achieve Goals: Good    Frequency Min 3X/week   Barriers to discharge Decreased caregiver support husband works    Co-evaluation               End of [place] During Treatment: Gait belt Activity Tolerance: Patient tolerated treatment well Patient left: in chair;with call bell/phone within reach;with chair alarm set Nurse Communication: Mobility status         Time: [phone] PT Time Calculation (min) (ACUTE ONLY): 19 min   Charges:   PT Evaluation $PT Eval Moderate Complexity: 1 Procedure     PT G CodesDuncan Dull Sep 22, 2015, 1:02 PM  Alben Deeds, [place] DPT  [phone]

## 2015-09-14 NOTE — Progress Notes (Addendum)
Patient ID: [name] [name], female   DOB: 02/26/1951, 65 y.o.   MRN: [id number] Patient reports increased strength in right lower extremity and increased coordination of right hand  Apraxia right hemiparesis predominantly leg less so in the arm.  Metastatic workup continuing,  recommend stereotactic radiosurgery for left-sided metastatic lesion. I have discussed this with Dr. Kyung Rudd and we will try to get this on the schedule to perform this week. Continue IV Decadron

## 2015-09-14 NOTE — Progress Notes (Signed)
Orthopedic Tech Progress Note Patient Details:  [name] [name] May 11, 1951 [id number]  Ortho Devices Type of Ortho Device: Knee Immobilizer Ortho Device/Splint Interventions: Application   [place] Pink 09/14/2015, 1:13 PM

## 2015-09-14 NOTE — Progress Notes (Signed)
Patient Demographics:    [name] [name], is a 66 y.o. female, DOB - 03-19-1951, [id number]  Admit date - 09/12/2015   Admitting Physician Orvan Falconer, MD  Outpatient Primary MD for the patient is [place]  LOS - 1   Chief Complaint  Patient presents with  . Fall  . Extremity Weakness        Subjective:    [name] [name] today has, No headache, No chest pain, No abdominal pain - No Nausea, No new weakness tingling or numbness, No Cough - SOB.  Improved right lower extremity weakness.   Assessment  & Plan :     1.Right leg weakness and tingling. Likely due to left frontoparietal brain metastases, MRI of the spine reviewed and discussed with neurosurgeon Dr. Saintclair Halsted, symptoms likely due to the brain mass. She has shown improvement with IV steroids which will be continued, radiation oncology planning to start stereotactic treatments this week, PT on board, right knee immobilizer provided. Continue supportive care. This likely is metastatic colon cancer, her primary oncologist has also been involved.  2. History of metastatic colon cancer. With metastases to liver and peritoneum and now likely to the brain, status post resection by Dr. gross last year, follows with Dr. Burr Medico oncologist, has also received chemotherapy, oncology will be involved. Discussed with Dr. Alen Blew.  3. Hypercalcemia. Likely due to malignancy. ACE level, PTH and PTH related peptide ordered, IV fluids, Lasix, calcitonin, calcium improving. If not better will use pamidronate.  4. History of PE. On xaralto continue. Discussed with neurosurgery okay to continue.  5. GERD. On PPI continue.    Code Status : Full  Family Communication  : None present  Disposition Plan  : Stay inpatient for now  Consults  :  Oncology Dr.  Alen Blew, neurosurgery Dr. Saintclair Halsted, radiation oncology Dr. Genia Harold  Procedures  :   MRI brain. Left frontoparietal mass with edema.  MRI C, T and L-spine. L-spine disc protrusion with possible nerve impingement at L3 level. Multilevel DJD also noted.  DVT Prophylaxis  :  Xaralto  Lab Results  Component Value Date   PLT 360 09/13/2015    Inpatient Medications  Scheduled Meds: . calcitonin  50 Units Intramuscular Daily  . dexamethasone  10 mg Intravenous 4 times per day  . furosemide  20 mg Intravenous Q6H  . gabapentin  300 mg Oral TID  . pantoprazole  40 mg Oral Q0600  . rivaroxaban  20 mg Oral Q supper  . vitamin B-12  100 mcg Oral Daily   Continuous Infusions: . sodium chloride 150 mL/hr at 09/14/15 0845   PRN Meds:.acetaminophen, iohexol, iohexol  Antibiotics  :    Anti-infectives    None        Objective:   Filed Vitals:   09/13/15 1200 09/13/15 1317 09/13/15 2154 09/14/15 0444  BP: 140/84 131/62 120/63 128/64  Pulse: 98 104 92 84  Temp:  97.7 F (36.5 C) 97.9 F (36.6 C) 97.9 F (36.6 C)  TempSrc:  Oral Oral Oral  Resp: 18 18 17 18   Height:  5' 5.5" (1.664 m)    Weight:  67.132 kg (148 lb)    SpO2: 99% 100% 100% 100%    Wt Readings from Last 3  Encounters:  09/13/15 67.132 kg (148 lb)  08/01/15 70.489 kg (155 lb 6.4 oz)  06/20/15 70.761 kg (156 lb)     Intake/Output Summary (Last 24 hours) at 09/14/15 1028 Last data filed at 09/14/15 [id number]  Gross per 24 hour  Intake 2146.33 ml  Output   1000 ml  Net 1146.33 ml     Physical Exam  Awake Alert, Oriented X 3, No new F.N deficits, Right leg strength 4/5, Normal affect [place].AT,PERRAL Supple Neck,No JVD, No cervical lymphadenopathy appriciated.  Symmetrical Chest wall movement, Good air movement bilaterally, CTAB RRR,No Gallops,Rubs or new Murmurs, No Parasternal Heave +ve B.Sounds, Abd Soft, No tenderness, No organomegaly appriciated, No rebound - guarding or rigidity. No Cyanosis, Clubbing or edema,  No new Rash or bruise     Data Review:   Micro Results No results found for this or any previous visit (from the past 240 hour(s)).  Radiology Reports Dg Lumbar Spine Complete  09/08/2015  CLINICAL DATA:  Low back pain radiating into the left leg with weakness and numbness, recent fall EXAM: LUMBAR SPINE - COMPLETE 4+ VIEW COMPARISON:  Coronal and sagittal reconstructed images through the lumbar spine from a CT scan dated March 03, 2015. Chest x-ray of January 01, 2015 for purposes of vertebral level labeling. FINDINGS: The twelfth ribs are hypoplastic. L5 is transitional. The lumbar vertebral bodies are preserved in height. There is grade 1 anterolisthesis of L3 with respect L4 with mild disc space narrowing. There is moderate disc space narrowing at L4-5. There is no compression fracture. The pedicles and transverse processes appear intact. There is a pseudoarthrosis on the left at L5. IMPRESSION: There is degenerative disc space narrowing with grade 1 anterolisthesis of L3 with respect L4. There is degenerative disc space narrowing at L4-5. There is no compression fracture. If the patient's radicular symptoms persist, MRI would be a useful next imaging step. Electronically Signed   By: [name]  Martinique M.D.   On: 09/08/2015 13:11   Dg Abd 1 View  09/13/2015  CLINICAL DATA:  Weakness.  Rule out metallic stent. EXAM: ABDOMEN - 1 VIEW COMPARISON:  CT 03/03/2015 FINDINGS: Colonic stent noted 03/03/2015 is no longer visualized. Bowel sutures seen over the pelvis and central abdomen. Moderate stool volume without obstruction. IMPRESSION: No metallic stent over the colon. Electronically Signed   By: Monte Fantasia M.D.   On: 09/13/2015 01:15   Mr [name] [id number] Contrast  09/13/2015  CLINICAL DATA:  Right lower extremity weakness. History of metastatic colon cancer. EXAM: MRI HEAD WITHOUT AND WITH CONTRAST TECHNIQUE: Multiplanar, multiecho pulse sequences of the brain and surrounding structures were obtained  without and with intravenous contrast. CONTRAST:  28mL MULTIHANCE GADOBENATE DIMEGLUMINE 529 MG/ML IV SOLN COMPARISON:  None. FINDINGS: The study is mildly motion degraded. There is no evidence of acute infarct, intracranial hemorrhage, midline shift, or extra-axial fluid collection. Ventricles and sulci are normal for age. No significant chronic white matter disease is seen. Within the high medial left frontoparietal region, there is a 1.7 x 1.6 x 1.4 cm intra-axial mass with predominantly peripheral enhancement which is thickest along its lateral margin. There is moderate surrounding vasogenic edema with local sulcal effacement. No other enhancing brain lesions are identified. Orbits are unremarkable. A tiny left maxillary sinus mucous retention cyst is noted. The mastoid air cells are clear. Major intracranial vascular flow voids are preserved. No suspicious skull lesions are identified. IMPRESSION: 1.7 cm left frontoparietal mass with moderate vasogenic edema, most consistent with solitary  metastasis. No midline shift. Electronically Signed   By: Logan Bores M.D.   On: 09/13/2015 08:43   Mr Lumbar Spine Wo Contrast  09/13/2015  CLINICAL DATA:  Initial evaluation for worsening right leg numbness and weakness. EXAM: MRI LUMBAR SPINE WITHOUT CONTRAST TECHNIQUE: Multiplanar, multisequence MR imaging of the lumbar spine was performed. No intravenous contrast was administered. COMPARISON:  Prior study from 09/08/2015. FINDINGS: Transitional lumbosacral anatomy present with partial sacralization of the L5 vertebral body. Mild levoscoliosis of the lumbar spine present. There is trace anterolisthesis of L3 on L4. Vertebral bodies otherwise normally aligned with preservation of the normal lumbar lordosis. Vertebral body heights well preserved. Signal intensity within the vertebral body bone marrow is normal. Benign hemangioma noted within the T12 vertebral body. No other focal osseous lesions. No marrow edema. Conus  medullaris terminates normally at the L1 level. Signal intensity within the visualized cord is normal. Nerve roots of the cauda equina within normal limits. Small amount of presacral free fluid noted, as seen on sagittal STIR sequence (series 5, image 7). This is of uncertain significance. No other acute soft tissue abnormality. Small T2 hyperintense cyst present within the right kidney. Gallbladder within normal limits. No pathologically enlarged retroperitoneal lymph nodes identified. Shotty subcentimeter left periaortic nodes noted. L1-2: Minimal annular disc bulge without focal disc herniation. No stenosis. L2-3: Mild diffuse disc bulge with disc desiccation. No focal disc herniation. Small annular fissure posteriorly. Mild facet and ligamentum flavum hypertrophy. No significant canal stenosis. Foramina are widely patent. L3-4: Trace anterolisthesis of L3 on L4. Mild diffuse disc bulge with disc desiccation. Associated annular fissure present posteriorly. Right greater than left facet arthrosis. 12 mm synovial cyst at the posterior aspect of the right L3-4 facet. There is mild to moderate canal and lateral recess stenosis at this level. Superimposed right foraminal disc protrusion (series 7, image 20). Protruding disc closely approximates the exiting right L3 nerve root in the right neural foramen (series 4, image 5). This could potentially resultant right lower extremity radicular symptoms. Moderate right foraminal stenosis. No significant left foraminal narrowing. L4-5: Degenerative intervertebral disc space narrowing with endplate changes. Diffuse disc bulge without focal disc herniation. Facet and ligamentum flavum hypertrophy. Mild canal and lateral recess stenosis bilaterally. No significant foraminal narrowing. L5-S1: Transitional lumbosacral anatomy with rudimentary L5-S1 disc. No stenosis. IMPRESSION: 1. Trace anterolisthesis of L3 on L4 with associated disc bulge and superimposed shallow right  foraminal disc protrusion. The protruding disc closely approximates the exiting right L3 nerve root, and could potentially result in right lower extremity radicular symptoms. Mild to moderate canal and moderate right foraminal stenosis at this level. 2. Degenerative disc bulge and facet disease at L4-5 with resultant mild canal stenosis. 3. Mild degenerative disc bulge at L2-3 without stenosis or neural impingement. 4. Transitional lumbosacral anatomy with sacralization of the L5 vertebral body. Careful correlation with numbering system on this study as well as plain film radiographs recommended prior to any potential future surgical intervention. 5. Small amount of presacral free fluid/edema, of uncertain significance. Electronically Signed   By: Jeannine Boga M.D.   On: 09/13/2015 04:58   Mr Cervical Spine W Wo Contrast  09/13/2015  CLINICAL DATA:  Worsening right leg numbness weakness. History of metastatic colon cancer. No acute injury or prior relevant surgery. EXAM: MRI CERVICAL SPINE WITHOUT AND WITH CONTRAST; MRI THORACIC SPINE WITHOUT AND WITH CONTRAST TECHNIQUE: Multiplanar and multiecho pulse sequences of the cervical spine, to include the craniocervical junction and cervicothoracic junction, were  obtained according to standard protocol without and with intravenous contrast.; Multiplanar and multiecho pulse sequences of the thoracic spine were obtained without and with intravenous contrast. CONTRAST:  80mL MULTIHANCE GADOBENATE DIMEGLUMINE 529 MG/ML IV SOLN COMPARISON:  Lumbar MRI same date.  Chest CT 03/03/2015. FINDINGS: Scan quality is mildly degraded by motion, especially on the post-contrast images. CERVICAL SPINE FINDINGS: Alignment: There is 4 mm of anterolisthesis at C7-T1. There is also a minimal anterolisthesis at C6-7. Bones: No acute or suspicious osseous findings. There are facet degenerative changes throughout the cervical spine and mild endplate degenerative changes. Cord: Normal  in signal and caliber. No abnormal intradural enhancement. Posterior Fossa: Visualized portions unremarkable. Vertebral Arteries: Bilateral vertebral artery flow voids. Paraspinal tissues: Unremarkable. Disc levels: No significant disc space findings at or above C3-4. C4-5: Loss of disc height with mild uncinate spurring and facet hypertrophy. No cord deformity or foraminal compromise. C5-6: Loss of disc height with posterior osteophytes, uncinate spurring and facet hypertrophy. There is asymmetric mild to moderate right foraminal narrowing. No cord deformity. C6-7: Mild disc bulging, uncinate spurring and facet hypertrophy. No cord deformity or significant foraminal compromise. C7-T1: Advanced facet hypertrophy accounts for the grade 1 anterolisthesis. There is mild disc bulging contributing to mild foraminal narrowing bilaterally. No cord deformity. THORACIC SPINE FINDINGS: Alignment: Mild scoliosis. Anterolisthesis at C7-T1 as described above. Otherwise normal. Bones: No acute or suspicious osseous findings. No evidence of osseous metastatic disease. T12 hemangioma noted. Cord:  The thoracic cord appears normal in signal and caliber. Paraspinal and other soft tissues: No significant paraspinal abnormalities. Multiple hepatic lesions are partially imaged, corresponding with known hemangiomas and metastatic disease on prior CT. Disc levels: No significant thoracic disc space findings. Specifically, no evidence of disc herniation, spinal stenosis or nerve root encroachment. IMPRESSION: 1. No evidence of metastatic disease within the cervical thoracic spine. 2. Prominent anterolisthesis at C7-T1 due to facet disease. No resulting cord deformity or high-grade foraminal narrowing. 3. Mild additional cervical spondylosis without high-grade spinal stenosis or nerve root encroachment. No significant thoracic disc space findings. 4. Limited visualization of known hepatic lesions. Electronically Signed   By: Richardean Sale  M.D.   On: 09/13/2015 08:53   Mr Thoracic Spine W Wo Contrast  09/13/2015  CLINICAL DATA:  Worsening right leg numbness weakness. History of metastatic colon cancer. No acute injury or prior relevant surgery. EXAM: MRI CERVICAL SPINE WITHOUT AND WITH CONTRAST; MRI THORACIC SPINE WITHOUT AND WITH CONTRAST TECHNIQUE: Multiplanar and multiecho pulse sequences of the cervical spine, to include the craniocervical junction and cervicothoracic junction, were obtained according to standard protocol without and with intravenous contrast.; Multiplanar and multiecho pulse sequences of the thoracic spine were obtained without and with intravenous contrast. CONTRAST:  32mL MULTIHANCE GADOBENATE DIMEGLUMINE 529 MG/ML IV SOLN COMPARISON:  Lumbar MRI same date.  Chest CT 03/03/2015. FINDINGS: Scan quality is mildly degraded by motion, especially on the post-contrast images. CERVICAL SPINE FINDINGS: Alignment: There is 4 mm of anterolisthesis at C7-T1. There is also a minimal anterolisthesis at C6-7. Bones: No acute or suspicious osseous findings. There are facet degenerative changes throughout the cervical spine and mild endplate degenerative changes. Cord: Normal in signal and caliber. No abnormal intradural enhancement. Posterior Fossa: Visualized portions unremarkable. Vertebral Arteries: Bilateral vertebral artery flow voids. Paraspinal tissues: Unremarkable. Disc levels: No significant disc space findings at or above C3-4. C4-5: Loss of disc height with mild uncinate spurring and facet hypertrophy. No cord deformity or foraminal compromise. C5-6: Loss of disc height  with posterior osteophytes, uncinate spurring and facet hypertrophy. There is asymmetric mild to moderate right foraminal narrowing. No cord deformity. C6-7: Mild disc bulging, uncinate spurring and facet hypertrophy. No cord deformity or significant foraminal compromise. C7-T1: Advanced facet hypertrophy accounts for the grade 1 anterolisthesis. There is mild  disc bulging contributing to mild foraminal narrowing bilaterally. No cord deformity. THORACIC SPINE FINDINGS: Alignment: Mild scoliosis. Anterolisthesis at C7-T1 as described above. Otherwise normal. Bones: No acute or suspicious osseous findings. No evidence of osseous metastatic disease. T12 hemangioma noted. Cord:  The thoracic cord appears normal in signal and caliber. Paraspinal and other soft tissues: No significant paraspinal abnormalities. Multiple hepatic lesions are partially imaged, corresponding with known hemangiomas and metastatic disease on prior CT. Disc levels: No significant thoracic disc space findings. Specifically, no evidence of disc herniation, spinal stenosis or nerve root encroachment. IMPRESSION: 1. No evidence of metastatic disease within the cervical thoracic spine. 2. Prominent anterolisthesis at C7-T1 due to facet disease. No resulting cord deformity or high-grade foraminal narrowing. 3. Mild additional cervical spondylosis without high-grade spinal stenosis or nerve root encroachment. No significant thoracic disc space findings. 4. Limited visualization of known hepatic lesions. Electronically Signed   By: Richardean Sale M.D.   On: 09/13/2015 08:53   Dg Foot Complete Right  09/12/2015  CLINICAL DATA:  Right foot numbness. Recent falls. Numbness in the right lower extremity. EXAM: RIGHT FOOT COMPLETE - 3+ VIEW COMPARISON:  None. FINDINGS: There is no evidence of fracture or dislocation. There is no evidence of arthropathy or other focal bone abnormality. Soft tissues are unremarkable. Prominent calcification and spurring is noted at the attachment of the Achilles tendon. IMPRESSION: 1. No acute abnormality. 2. Prominent calcification and spurring at the attachment of the Achilles tendon. Electronically Signed   By: San Morelle M.D.   On: 09/12/2015 15:15     CBC  Recent Labs Lab 09/13/15 0604  WBC 6.0  HGB 10.8*  HCT 33.2*  PLT 360  MCV 83.0  MCH 27.0  MCHC  32.5  RDW 14.4  LYMPHSABS 1.7  MONOABS 0.8  EOSABS 0.2  BASOSABS 0.0    Chemistries   Recent Labs Lab 09/13/15 0604 09/14/15 0406 09/14/15 0812  NA 142  --  140  K 4.2  --  4.2  CL 108  --  110  CO2 26  --  19*  GLUCOSE 102*  --  147*  BUN 10  --  10  CREATININE 0.78  --  0.65  CALCIUM 11.5* 11.5* 11.1*  AST  --   --  23  ALT  --   --  15  ALKPHOS  --   --  50  BILITOT  --   --  0.2*   ------------------------------------------------------------------------------------------------------------------ No results for input(s): CHOL, HDL, LDLCALC, TRIG, CHOLHDL, LDLDIRECT in the last 72 hours.  Lab Results  Component Value Date   HGBA1C 5.9* 03/03/2015   ------------------------------------------------------------------------------------------------------------------  Recent Labs  09/13/15 1202  TSH 0.844   ------------------------------------------------------------------------------------------------------------------ No results for input(s): VITAMINB12, FOLATE, FERRITIN, TIBC, IRON, RETICCTPCT in the last 72 hours.  Coagulation profile  Recent Labs Lab 09/13/15 0604  INR 1.72*    No results for input(s): DDIMER in the last 72 hours.  Cardiac Enzymes No results for input(s): CKMB, TROPONINI, MYOGLOBIN in the last 168 hours.  Invalid input(s): CK ------------------------------------------------------------------------------------------------------------------ No results found for: BNP  Time Spent in minutes  35   Lala Lund K M.D on 09/14/2015 at 10:28 AM  Between 7am  to 7pm - Pager - [phone]  After 7pm go to [email or web address] - password [facility]  [facility] -  Office  [phone]

## 2015-09-14 NOTE — Progress Notes (Signed)
Patient ID: [name] [name], female   DOB: [birth year], 65 y.o.   MRN: [id number] Addendum to address spinal MRI...   Patient denies any radicular pain or sciatica the vast majority of the weakness she is noted more distally than proximally. She does have significant quadricep weakness as well as dorsiflexion weakness and plantar flexion weakness she seems to have 4-4+ iliopsoas as 4 out of 5 quadricep 2 out of 5 dorsiflexion to out of 5 plantar flexion. The MRI of her lumbar spine shows spondylosis at multiple levels the most significant finding is a foraminal disc herniation at L3-4 displacing the right L3 nerve root. If this were causing her a problem it would affect predominantly the iliopsoas and quadricep and her white right leg and give her decreased sensation all in her anterior quad above her knee it would not involve dorsal flexion plantar flexion were distal right lower extremity. Therefore I think the vast majority of her symptoms are referable to the lesion that looks like it's located in the motor strip of her left frontal lobe and as the lesion is located more medially would more much more greatly affect the leg than it would the arm. As she has no real back pain and no radicular pain I do not think that her right looks to me weakness represents a right L3 radiculopathy.

## 2015-09-15 ENCOUNTER — Telehealth: Payer: Self-pay

## 2015-09-15 ENCOUNTER — Ambulatory Visit
Admit: 2015-09-15 | Discharge: 2015-09-15 | Disposition: A | Discharge status: Home / Self Care | Payer: Medicaid Other | Attending: Radiation Oncology | Admitting: Radiation Oncology

## 2015-09-15 ENCOUNTER — Inpatient Hospital Stay (HOSPITAL_COMMUNITY): Payer: Medicaid Other

## 2015-09-15 DIAGNOSIS — D649 Anemia, unspecified: Secondary | ICD-10-CM

## 2015-09-15 DIAGNOSIS — C7931 Secondary malignant neoplasm of brain: Principal | ICD-10-CM

## 2015-09-15 DIAGNOSIS — C787 Secondary malignant neoplasm of liver and intrahepatic bile duct: Secondary | ICD-10-CM

## 2015-09-15 DIAGNOSIS — C189 Malignant neoplasm of colon, unspecified: Secondary | ICD-10-CM | POA: Insufficient documentation

## 2015-09-15 DIAGNOSIS — R531 Weakness: Secondary | ICD-10-CM

## 2015-09-15 LAB — COMPREHENSIVE METABOLIC PANEL
ALBUMIN: 3.4 g/dL — AB (ref 3.5–5.0)
ALK PHOS: 49 U/L (ref 38–126)
ALT: 15 U/L (ref 14–54)
ANION GAP: 8 (ref 5–15)
AST: 19 U/L (ref 15–41)
BUN: 13 mg/dL (ref 6–20)
CHLORIDE: 112 mmol/L — AB (ref 101–111)
CO2: 20 mmol/L — AB (ref 22–32)
Calcium: 10.6 mg/dL — ABNORMAL HIGH (ref 8.9–10.3)
Creatinine, Ser: 0.6 mg/dL (ref 0.44–1.00)
GFR calc Af Amer: >60 mL/min (ref >60)
GFR calc non Af Amer: >60 mL/min (ref >60)
GLUCOSE: 134 mg/dL — AB (ref 65–99)
Potassium: 4.1 mmol/L (ref 3.5–5.1)
SODIUM: 140 mmol/L (ref 135–145)
TOTAL PROTEIN: 7.1 g/dL (ref 6.5–8.1)
Total Bilirubin: 0.4 mg/dL (ref 0.3–1.2)

## 2015-09-15 LAB — PARATHYROID HORMONE, INTACT (NO CA): PTH: 222 pg/mL — AB (ref 15–65)

## 2015-09-15 LAB — ANGIOTENSIN CONVERTING ENZYME: ANGIOTENSIN-CONVERTING ENZYME: 35 U/L (ref 14–82)

## 2015-09-15 MED ORDER — GADOBENATE DIMEGLUMINE 529 MG/ML IV SOLN: 15.0000 mL | Freq: Once | INTRAVENOUS | Status: DC | PRN

## 2015-09-15 MED ORDER — LACTATED RINGERS IV SOLN
INTRAVENOUS | Status: AC
  Administered 2015-09-15 – 2015-09-16 (×2): via INTRAVENOUS

## 2015-09-15 MED ORDER — BACLOFEN 10 MG PO TABS
5.0000 mg | ORAL_TABLET | Freq: Three times a day (TID) | ORAL | Status: DC
  Administered 2015-09-15 – 2015-09-17 (×7): 5 mg via ORAL
  Filled 2015-09-15 (×7): qty 1

## 2015-09-15 MED ORDER — LORAZEPAM 0.5 MG PO TABS
0.5000 mg | ORAL_TABLET | ORAL | Status: DC | PRN
  Administered 2015-09-15: 0.5 mg via ORAL
  Filled 2015-09-15: qty 1

## 2015-09-15 MED ORDER — FUROSEMIDE 10 MG/ML IJ SOLN
20.0000 mg | Freq: Two times a day (BID) | INTRAMUSCULAR | Status: AC
  Administered 2015-09-15 – 2015-09-16 (×2): 20 mg via INTRAVENOUS
  Filled 2015-09-15 (×2): qty 2

## 2015-09-15 MED ORDER — DEXAMETHASONE SODIUM PHOSPHATE 10 MG/ML IJ SOLN
10.0000 mg | Freq: Two times a day (BID) | INTRAMUSCULAR | Status: DC
  Administered 2015-09-15 – 2015-09-17 (×4): 10 mg via INTRAVENOUS
  Filled 2015-09-15 (×4): qty 1

## 2015-09-15 MED ORDER — SODIUM CHLORIDE 0.9 % IV SOLN: INTRAVENOUS | Status: DC

## 2015-09-15 NOTE — Progress Notes (Addendum)
Patient Demographics:    [name] [name], is a 65 y.o. female, DOB - 23-Aug-1950, [id number]  Admit date - 09/12/2015   Admitting Physician Orvan Falconer, MD  Outpatient Primary MD for the patient is [place] PA  LOS - 2   Chief Complaint  Patient presents with  . Fall  . Extremity Weakness        Subjective:    [name] today has, No headache, No chest pain, No abdominal pain - No Nausea, No new weakness tingling or numbness, No Cough - SOB. Much more improved right lower extremity weakness.   Assessment  & Plan :     1.Right leg weakness and tingling. Likely due to left frontoparietal brain metastases, MRI of the spine reviewed and discussed with neurosurgeon Dr. Saintclair Halsted, symptoms likely due to the brain mass. She has shown improvement with IV steroids which will be continued but now twice a day instead of 4 times a day, we have also consulted Dr. Lisbeth Renshaw radiation oncology who is planning to start stereotactic treatments later this week, PT on board, right knee immobilizer provided.   Continue supportive care. This likely is metastatic colon cancer, Discussed her case with her primary oncologist Dr. Pilar Plate who will see the patient on 09/15/2015, agrees with involving radiation oncology.    2. History of metastatic colon cancer. With metastases to liver and peritoneum and now likely to the brain, status post resection by Dr. Johney Maine last year, follows with Dr. Burr Medico oncologist, has also received chemotherapy, Discussed with Dr. Burr Medico, patient has been noncompliant with chemotherapy, last CEA was high and has been trending up. She will be seen by oncology later on 09/15/2015.  3. Hypercalcemia. Likely due to malignancy. ACE level, PTH and PTH related peptide ordered, IV fluids, Lasix, calcitonin,  calcium improving. If not better will can use pamidronate.  4. History of PE. On xaralto continue. Discussed with neurosurgery okay to continue.  5. GERD. On PPI continue.    Code Status : Full  Family Communication  : None present  Disposition Plan  : SNF versus CIR, social work and CIR both consulted  Consults  :  Oncology Dr. Lars Masson, neurosurgery Dr. Saintclair Halsted, radiation oncology Dr. Marland Mcalpine  Procedures  :   MRI brain. Left frontoparietal mass with edema.  MRI C, T and L-spine. L-spine disc protrusion with possible nerve impingement at L3 level. Multilevel DJD also noted.  DVT Prophylaxis  :  Xaralto  Lab Results  Component Value Date   PLT 360 09/13/2015    Inpatient Medications  Scheduled Meds: . dexamethasone  10 mg Intravenous Q12H  . furosemide  20 mg Intravenous BID  . gabapentin  300 mg Oral TID  . pantoprazole  40 mg Oral Q0600  . rivaroxaban  20 mg Oral Q supper  . vitamin B-12  100 mcg Oral Daily   Continuous Infusions: . sodium chloride     PRN Meds:.acetaminophen, iohexol, iohexol  Antibiotics  :    Anti-infectives    None        Objective:   Filed Vitals:   09/14/15 1328 09/14/15 2146 09/15/15 0404 09/15/15 0541  BP: 131/63 123/55 110/54 115/64  Pulse: 92 84 76 72  Temp: 97.8 F (36.6 C) 98.3 F (  36.8 C) 98.2 F (36.8 C) 98.2 F (36.8 C)  TempSrc: Oral Oral Oral Oral  Resp: 18 18 18 18   Height:      Weight:      SpO2: 100% 99% 100% 99%    Wt Readings from Last 3 Encounters:  09/13/15 67.132 kg (148 lb)  08/01/15 70.489 kg (155 lb 6.4 oz)  06/20/15 70.761 kg (156 lb)     Intake/Output Summary (Last 24 hours) at 09/15/15 1007 Last data filed at 09/15/15 0855  Gross per 24 hour  Intake   2960 ml  Output   5050 ml  Net  -2090 ml     Physical Exam  Awake Alert, Oriented X 3, No new F.N deficits, Right leg strength 4/5, Normal affect [place].AT,PERRAL Supple Neck,No JVD, No cervical lymphadenopathy appriciated.  Symmetrical  Chest wall movement, Good air movement bilaterally, CTAB RRR,No Gallops,Rubs or new Murmurs, No Parasternal Heave +ve B.Sounds, Abd Soft, No tenderness, No organomegaly appriciated, No rebound - guarding or rigidity. No Cyanosis, Clubbing or edema, No new Rash or bruise     Data Review:   Micro Results No results found for this or any previous visit (from the past 240 hour(s)).  Radiology Reports Dg Lumbar Spine Complete  09/08/2015  CLINICAL DATA:  Low back pain radiating into the left leg with weakness and numbness, recent fall EXAM: LUMBAR SPINE - COMPLETE 4+ VIEW COMPARISON:  Coronal and sagittal reconstructed images through the lumbar spine from a CT scan dated March 03, 2015. Chest x-ray of January 01, 2015 for purposes of vertebral level labeling. FINDINGS: The twelfth ribs are hypoplastic. L5 is transitional. The lumbar vertebral bodies are preserved in height. There is grade 1 anterolisthesis of L3 with respect L4 with mild disc space narrowing. There is moderate disc space narrowing at L4-5. There is no compression fracture. The pedicles and transverse processes appear intact. There is a pseudoarthrosis on the left at L5. IMPRESSION: There is degenerative disc space narrowing with grade 1 anterolisthesis of L3 with respect L4. There is degenerative disc space narrowing at L4-5. There is no compression fracture. If the patient's radicular symptoms persist, MRI would be a useful next imaging step. Electronically Signed   By: [name]  Martinique M.D.   On: 09/08/2015 13:11   Dg Abd 1 View  09/13/2015  CLINICAL DATA:  Weakness.  Rule out metallic stent. EXAM: ABDOMEN - 1 VIEW COMPARISON:  CT 03/03/2015 FINDINGS: Colonic stent noted 03/03/2015 is no longer visualized. Bowel sutures seen over the pelvis and central abdomen. Moderate stool volume without obstruction. IMPRESSION: No metallic stent over the colon. Electronically Signed   By: Monte Fantasia M.D.   On: 09/13/2015 01:15   Mr [name] [id number]  Contrast  09/13/2015  CLINICAL DATA:  Right lower extremity weakness. History of metastatic colon cancer. EXAM: MRI HEAD WITHOUT AND WITH CONTRAST TECHNIQUE: Multiplanar, multiecho pulse sequences of the brain and surrounding structures were obtained without and with intravenous contrast. CONTRAST:  33mL MULTIHANCE GADOBENATE DIMEGLUMINE 529 MG/ML IV SOLN COMPARISON:  None. FINDINGS: The study is mildly motion degraded. There is no evidence of acute infarct, intracranial hemorrhage, midline shift, or extra-axial fluid collection. Ventricles and sulci are normal for age. No significant chronic white matter disease is seen. Within the high medial left frontoparietal region, there is a 1.7 x 1.6 x 1.4 cm intra-axial mass with predominantly peripheral enhancement which is thickest along its lateral margin. There is moderate surrounding vasogenic edema with local sulcal effacement. No other  enhancing brain lesions are identified. Orbits are unremarkable. A tiny left maxillary sinus mucous retention cyst is noted. The mastoid air cells are clear. Major intracranial vascular flow voids are preserved. No suspicious skull lesions are identified. IMPRESSION: 1.7 cm left frontoparietal mass with moderate vasogenic edema, most consistent with solitary metastasis. No midline shift. Electronically Signed   By: Logan Bores M.D.   On: 09/13/2015 08:43   Mr Lumbar Spine Wo Contrast  09/13/2015  CLINICAL DATA:  Initial evaluation for worsening right leg numbness and weakness. EXAM: MRI LUMBAR SPINE WITHOUT CONTRAST TECHNIQUE: Multiplanar, multisequence MR imaging of the lumbar spine was performed. No intravenous contrast was administered. COMPARISON:  Prior study from 09/08/2015. FINDINGS: Transitional lumbosacral anatomy present with partial sacralization of the L5 vertebral body. Mild levoscoliosis of the lumbar spine present. There is trace anterolisthesis of L3 on L4. Vertebral bodies otherwise normally aligned with  preservation of the normal lumbar lordosis. Vertebral body heights well preserved. Signal intensity within the vertebral body bone marrow is normal. Benign hemangioma noted within the T12 vertebral body. No other focal osseous lesions. No marrow edema. Conus medullaris terminates normally at the L1 level. Signal intensity within the visualized cord is normal. Nerve roots of the cauda equina within normal limits. Small amount of presacral free fluid noted, as seen on sagittal STIR sequence (series 5, image 7). This is of uncertain significance. No other acute soft tissue abnormality. Small T2 hyperintense cyst present within the right kidney. Gallbladder within normal limits. No pathologically enlarged retroperitoneal lymph nodes identified. Shotty subcentimeter left periaortic nodes noted. L1-2: Minimal annular disc bulge without focal disc herniation. No stenosis. L2-3: Mild diffuse disc bulge with disc desiccation. No focal disc herniation. Small annular fissure posteriorly. Mild facet and ligamentum flavum hypertrophy. No significant canal stenosis. Foramina are widely patent. L3-4: Trace anterolisthesis of L3 on L4. Mild diffuse disc bulge with disc desiccation. Associated annular fissure present posteriorly. Right greater than left facet arthrosis. 12 mm synovial cyst at the posterior aspect of the right L3-4 facet. There is mild to moderate canal and lateral recess stenosis at this level. Superimposed right foraminal disc protrusion (series 7, image 20). Protruding disc closely approximates the exiting right L3 nerve root in the right neural foramen (series 4, image 5). This could potentially resultant right lower extremity radicular symptoms. Moderate right foraminal stenosis. No significant left foraminal narrowing. L4-5: Degenerative intervertebral disc space narrowing with endplate changes. Diffuse disc bulge without focal disc herniation. Facet and ligamentum flavum hypertrophy. Mild canal and lateral  recess stenosis bilaterally. No significant foraminal narrowing. L5-S1: Transitional lumbosacral anatomy with rudimentary L5-S1 disc. No stenosis. IMPRESSION: 1. Trace anterolisthesis of L3 on L4 with associated disc bulge and superimposed shallow right foraminal disc protrusion. The protruding disc closely approximates the exiting right L3 nerve root, and could potentially result in right lower extremity radicular symptoms. Mild to moderate canal and moderate right foraminal stenosis at this level. 2. Degenerative disc bulge and facet disease at L4-5 with resultant mild canal stenosis. 3. Mild degenerative disc bulge at L2-3 without stenosis or neural impingement. 4. Transitional lumbosacral anatomy with sacralization of the L5 vertebral body. Careful correlation with numbering system on this study as well as plain film radiographs recommended prior to any potential future surgical intervention. 5. Small amount of presacral free fluid/edema, of uncertain significance. Electronically Signed   By: Jeannine Boga M.D.   On: 09/13/2015 04:58   Mr Cervical Spine W Wo Contrast  09/13/2015  CLINICAL DATA:  Worsening right leg numbness weakness. History of metastatic colon cancer. No acute injury or prior relevant surgery. EXAM: MRI CERVICAL SPINE WITHOUT AND WITH CONTRAST; MRI THORACIC SPINE WITHOUT AND WITH CONTRAST TECHNIQUE: Multiplanar and multiecho pulse sequences of the cervical spine, to include the craniocervical junction and cervicothoracic junction, were obtained according to standard protocol without and with intravenous contrast.; Multiplanar and multiecho pulse sequences of the thoracic spine were obtained without and with intravenous contrast. CONTRAST:  87mL MULTIHANCE GADOBENATE DIMEGLUMINE 529 MG/ML IV SOLN COMPARISON:  Lumbar MRI same date.  Chest CT 03/03/2015. FINDINGS: Scan quality is mildly degraded by motion, especially on the post-contrast images. CERVICAL SPINE FINDINGS: Alignment: There  is 4 mm of anterolisthesis at C7-T1. There is also a minimal anterolisthesis at C6-7. Bones: No acute or suspicious osseous findings. There are facet degenerative changes throughout the cervical spine and mild endplate degenerative changes. Cord: Normal in signal and caliber. No abnormal intradural enhancement. Posterior Fossa: Visualized portions unremarkable. Vertebral Arteries: Bilateral vertebral artery flow voids. Paraspinal tissues: Unremarkable. Disc levels: No significant disc space findings at or above C3-4. C4-5: Loss of disc height with mild uncinate spurring and facet hypertrophy. No cord deformity or foraminal compromise. C5-6: Loss of disc height with posterior osteophytes, uncinate spurring and facet hypertrophy. There is asymmetric mild to moderate right foraminal narrowing. No cord deformity. C6-7: Mild disc bulging, uncinate spurring and facet hypertrophy. No cord deformity or significant foraminal compromise. C7-T1: Advanced facet hypertrophy accounts for the grade 1 anterolisthesis. There is mild disc bulging contributing to mild foraminal narrowing bilaterally. No cord deformity. THORACIC SPINE FINDINGS: Alignment: Mild scoliosis. Anterolisthesis at C7-T1 as described above. Otherwise normal. Bones: No acute or suspicious osseous findings. No evidence of osseous metastatic disease. T12 hemangioma noted. Cord:  The thoracic cord appears normal in signal and caliber. Paraspinal and other soft tissues: No significant paraspinal abnormalities. Multiple hepatic lesions are partially imaged, corresponding with known hemangiomas and metastatic disease on prior CT. Disc levels: No significant thoracic disc space findings. Specifically, no evidence of disc herniation, spinal stenosis or nerve root encroachment. IMPRESSION: 1. No evidence of metastatic disease within the cervical thoracic spine. 2. Prominent anterolisthesis at C7-T1 due to facet disease. No resulting cord deformity or high-grade foraminal  narrowing. 3. Mild additional cervical spondylosis without high-grade spinal stenosis or nerve root encroachment. No significant thoracic disc space findings. 4. Limited visualization of known hepatic lesions. Electronically Signed   By: Richardean Sale M.D.   On: 09/13/2015 08:53   Mr Thoracic Spine W Wo Contrast  09/13/2015  CLINICAL DATA:  Worsening right leg numbness weakness. History of metastatic colon cancer. No acute injury or prior relevant surgery. EXAM: MRI CERVICAL SPINE WITHOUT AND WITH CONTRAST; MRI THORACIC SPINE WITHOUT AND WITH CONTRAST TECHNIQUE: Multiplanar and multiecho pulse sequences of the cervical spine, to include the craniocervical junction and cervicothoracic junction, were obtained according to standard protocol without and with intravenous contrast.; Multiplanar and multiecho pulse sequences of the thoracic spine were obtained without and with intravenous contrast. CONTRAST:  72mL MULTIHANCE GADOBENATE DIMEGLUMINE 529 MG/ML IV SOLN COMPARISON:  Lumbar MRI same date.  Chest CT 03/03/2015. FINDINGS: Scan quality is mildly degraded by motion, especially on the post-contrast images. CERVICAL SPINE FINDINGS: Alignment: There is 4 mm of anterolisthesis at C7-T1. There is also a minimal anterolisthesis at C6-7. Bones: No acute or suspicious osseous findings. There are facet degenerative changes throughout the cervical spine and mild endplate degenerative changes. Cord: Normal in signal and caliber. No abnormal  intradural enhancement. Posterior Fossa: Visualized portions unremarkable. Vertebral Arteries: Bilateral vertebral artery flow voids. Paraspinal tissues: Unremarkable. Disc levels: No significant disc space findings at or above C3-4. C4-5: Loss of disc height with mild uncinate spurring and facet hypertrophy. No cord deformity or foraminal compromise. C5-6: Loss of disc height with posterior osteophytes, uncinate spurring and facet hypertrophy. There is asymmetric mild to moderate  right foraminal narrowing. No cord deformity. C6-7: Mild disc bulging, uncinate spurring and facet hypertrophy. No cord deformity or significant foraminal compromise. C7-T1: Advanced facet hypertrophy accounts for the grade 1 anterolisthesis. There is mild disc bulging contributing to mild foraminal narrowing bilaterally. No cord deformity. THORACIC SPINE FINDINGS: Alignment: Mild scoliosis. Anterolisthesis at C7-T1 as described above. Otherwise normal. Bones: No acute or suspicious osseous findings. No evidence of osseous metastatic disease. T12 hemangioma noted. Cord:  The thoracic cord appears normal in signal and caliber. Paraspinal and other soft tissues: No significant paraspinal abnormalities. Multiple hepatic lesions are partially imaged, corresponding with known hemangiomas and metastatic disease on prior CT. Disc levels: No significant thoracic disc space findings. Specifically, no evidence of disc herniation, spinal stenosis or nerve root encroachment. IMPRESSION: 1. No evidence of metastatic disease within the cervical thoracic spine. 2. Prominent anterolisthesis at C7-T1 due to facet disease. No resulting cord deformity or high-grade foraminal narrowing. 3. Mild additional cervical spondylosis without high-grade spinal stenosis or nerve root encroachment. No significant thoracic disc space findings. 4. Limited visualization of known hepatic lesions. Electronically Signed   By: Richardean Sale M.D.   On: 09/13/2015 08:53   Dg Foot Complete Right  09/12/2015  CLINICAL DATA:  Right foot numbness. Recent falls. Numbness in the right lower extremity. EXAM: RIGHT FOOT COMPLETE - 3+ VIEW COMPARISON:  None. FINDINGS: There is no evidence of fracture or dislocation. There is no evidence of arthropathy or other focal bone abnormality. Soft tissues are unremarkable. Prominent calcification and spurring is noted at the attachment of the Achilles tendon. IMPRESSION: 1. No acute abnormality. 2. Prominent  calcification and spurring at the attachment of the Achilles tendon. Electronically Signed   By: San Morelle M.D.   On: 09/12/2015 15:15     CBC  Recent Labs Lab 09/13/15 0604  WBC 6.0  HGB 10.8*  HCT 33.2*  PLT 360  MCV 83.0  MCH 27.0  MCHC 32.5  RDW 14.4  LYMPHSABS 1.7  MONOABS 0.8  EOSABS 0.2  BASOSABS 0.0    Chemistries   Recent Labs Lab 09/13/15 0604 09/14/15 0406 09/14/15 0812 09/15/15 0637  NA 142  --  140 140  K 4.2  --  4.2 4.1  CL 108  --  110 112*  CO2 26  --  19* 20*  GLUCOSE 102*  --  147* 134*  BUN 10  --  10 13  CREATININE 0.78  --  0.65 0.60  CALCIUM 11.5* 11.5* 11.1* 10.6*  AST  --   --  23 19  ALT  --   --  15 15  ALKPHOS  --   --  50 49  BILITOT  --   --  0.2* 0.4   ------------------------------------------------------------------------------------------------------------------ No results for input(s): CHOL, HDL, LDLCALC, TRIG, CHOLHDL, LDLDIRECT in the last 72 hours.  Lab Results  Component Value Date   HGBA1C 5.9* 03/03/2015   ------------------------------------------------------------------------------------------------------------------  Recent Labs  09/13/15 1202  TSH 0.844   ------------------------------------------------------------------------------------------------------------------ No results for input(s): VITAMINB12, FOLATE, FERRITIN, TIBC, IRON, RETICCTPCT in the last 72 hours.  Coagulation profile  Recent  Labs Lab 09/13/15 0604  INR 1.72*    No results for input(s): DDIMER in the last 72 hours.  Cardiac Enzymes No results for input(s): CKMB, TROPONINI, MYOGLOBIN in the last 168 hours.  Invalid input(s): CK ------------------------------------------------------------------------------------------------------------------ No results found for: BNP  Time Spent in minutes  35   Camren Henthorn K M.D on 09/15/2015 at 10:07 AM  Between 7am to 7pm - Pager - [phone]  After 7pm go to  [email or web address] - password [email or web address]  [facility] -  Office  [phone]

## 2015-09-15 NOTE — Consult Note (Signed)
Radiation Oncology         [phone] ________________________________  Name: [name] [name] MRN: [id number]  Date: 09/12/2015  DOB: 1951/05/03  [id number] [facility]  No ref. provider found     REFERRING PHYSICIAN: No ref. provider found   DIAGNOSIS: The primary encounter diagnosis was Right leg weakness. 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   HISTORY OF PRESENT ILLNESS:[name] [name] is a 65 y.o. female seen at the request of Dr. Saintclair Halsted for new oligo-metastatic lesion of the brain. The patient was diagnosed with stage IV colon cancer in 2014 and has been on several chemotherapy regimens that were administered and neoadjuvant setting. She is under the care of Dr. Burr Medico. She did undergo laparoscopic low anterior resection, splenectomy, colostomy takedown, with additional biopsy. She was reluctant to started any additional chemotherapy, and was due to follow-up with Dr. Burr Medico but seems to missed her appointment for follow-up after there January 2017 appointment.   Over the weekend, the patient presents to the emergency department after a fall and increasing weakness in the past week or so. She did undergo multiple imaging studies including MRI of the entire spine, plain films of her right foot, an MRI of the brain. Fortunately no fracture was identified, however she did have a lesion in the left frontoparietal region measuring 1.7 x 1.6 x 1.4 cm with peripheral enhancement in thickness along its lateral margin. This is noted to affect some motor strip, vasogenic edema was noted to be moderate, and no midline shift was present. She is currently on 10 mg of dexamethasone twice daily, and given the finding, she is seen for consideration of SRS.  PREVIOUS RADIATION THERAPY: No   PAST MEDICAL HISTORY:  Past Medical History  Diagnosis Date  . Colon cancer ([place])     dx'd  2014  . Heart murmur     ? as a child   . Pulmonary embolism ([place]) 11/2014   . Anemia     hx of        PAST SURGICAL HISTORY: Past Surgical History  Procedure Laterality Date  . Abdominal hysterectomy  2005  . Cesarean section  1976  . Flexible sigmoidoscopy N/A 05/25/2013    Procedure: FLEXIBLE SIGMOIDOSCOPY;  Surgeon: [name], MD;  Location: [facility];  Service: Endoscopy;  Laterality: N/A;  needs floro  . Colonic stent placement N/A 05/25/2013    Procedure: COLONIC STENT PLACEMENT;  Surgeon: [name], MD;  Location: [facility];  Service: Endoscopy;  Laterality: N/A;  . Portacath placement Right 06/25/2013    Procedure: ULTRA SOUND GUIDED INSERTION PORT-A-CATH;  Surgeon: [name], MD;  Location: [place];  Service: General;  Laterality: Right;  . Flexible sigmoidoscopy N/A 08/19/2014    Procedure: FLEXIBLE SIGMOIDOSCOPY;  Surgeon: Gatha Mayer, MD;  Location: [facility];  Service: Endoscopy;  Laterality: N/A;  . Colonic stent placement N/A 08/19/2014    Procedure: COLONIC STENT PLACEMENT;  Surgeon: Gatha Mayer, MD;  [facility];  Service: Endoscopy;  Laterality: N/A;  . Flexible sigmoidoscopy N/A 08/19/2014    Procedure: FLEXIBLE SIGMOIDOSCOPY;  Surgeon: Gatha Mayer, MD;  [facility];  Service: Endoscopy;  Laterality: N/A;  . Colonic stent placement N/A 08/19/2014    Procedure: COLONIC STENT PLACEMENT;  Surgeon: Gatha Mayer, MD;  [facility];  Service: Endoscopy;  Laterality: N/A;  .  Colon resection N/A 10/28/2014    Procedure: Transverse loop colostomy;  Surgeon: [name], MD;  Location: [facility];  Service: General;  Laterality: N/A;  . Colostomy revision N/A 01/03/2015    Procedure: REVISION OF TRANSVERSE LOOP COLOSTOMY WITH PARTIAL  COLECTOMY;  Surgeon: [name], MD;  [facility];  Service: General;  Laterality: N/A;  . Laparoscopic low anterior resection  03/06/2015    03/06/2015    . Laparoscopic splenectomy  03/06/2015  . Colon resection N/A 03/06/2015    Procedure: LAPAROSCOPIC takedown loop colostomy ;  Surgeon: Michael Boston, MD;  [facility]: [facility];  Service: General;  Laterality: N/A;  . 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 Bowel resection  03/06/2015    Procedure: LOW ANTERIOR BOWEL RESECTION, rigid proctoscopy;  Surgeon: Michael Boston, MD;  [facility];  Service: General;;  . Polypectomy  03/06/2015    Procedure: transverse POLYPECTOMY x 4;  Surgeon: Michael Boston, MD;  [facility];  Service: General;;     FAMILY HISTORY:  Family History  Problem Relation Age of Onset  . Colon cancer Sister 88  . Diabetes Neg Hx   . Thyroid disease Neg Hx   . Breast cancer Maternal Aunt   . Prostate cancer Maternal Uncle   . Bone cancer Maternal Grandfather   . Prostate cancer Maternal Uncle      SOCIAL HISTORY:  reports that she quit smoking about 44 years ago. Her smoking use included Cigarettes. She quit after 1 year of use. She has never used smokeless tobacco. She reports that she does not drink alcohol or use illicit drugs.she resides in [place].   ALLERGIES: Oxycontin and Codeine   MEDICATIONS:  Current Facility-Administered Medications  Medication Dose Route Frequency Provider Last Rate Last Dose  . acetaminophen (TYLENOL) tablet 500 mg  500 mg Oral Q6H PRN Orvan Falconer, MD   500 mg at 09/15/15 1432  . baclofen (LIORESAL) tablet 5 mg  5 mg Oral TID Thurnell Lose, MD      . dexamethasone (DECADRON) injection 10 mg  10 mg Intravenous Q12H Thurnell Lose, MD      . furosemide (LASIX) injection 20 mg  20 mg Intravenous BID Thurnell Lose, MD   20 mg at 09/15/15 1200  . gabapentin (NEURONTIN) capsule 300 mg  300 mg Oral TID Orvan Falconer, MD   300 mg at 09/15/15 1044  . iohexol (OMNIPAQUE) 300 MG/ML solution 25 mL  25 mL Oral Once PRN Thurnell Lose,  MD      . iohexol (OMNIPAQUE) 300 MG/ML solution 25 mL  25 mL Oral Once PRN Thurnell Lose, MD      . lactated ringers infusion   Intravenous Continuous Thurnell Lose, MD 75 mL/hr at 09/15/15 1152    . LORazepam (ATIVAN) tablet 0.5 mg  0.5 mg Oral PRN Hayden Pedro, PA-C      . pantoprazole (PROTONIX) EC tablet 40 mg  40 mg Oral Q0600 Orvan Falconer, MD   40 mg at 09/15/15 0803  . rivaroxaban (XARELTO) tablet 20 mg  20 mg Oral Q supper Orvan Falconer, MD   20 mg at 09/14/15 1800  . vitamin B-12 (CYANOCOBALAMIN) tablet 100 mcg  100 mcg Oral Daily Orvan Falconer, MD   100 mcg at 09/15/15 1044     REVIEW OF SYSTEMS: On review of systems, the patient  states on Friday of last week she had an episode of a fall where she slid into part of the wall/door and her home. She thought that she broke her toe because of significant pain when this occurred, and presented to the emergency room. She had been experiencing weakness over the last week particularly in her right upper and lower extremities, and states that she has been very clumsy with her right hand. She is able to write, but her writing she states has become more "squiggly." She denies any bowel or bladder dysfunction. She states that she is not experiencing any headaches, blurred vision, double vision. She denies any chest pain or shortness of breath, fevers, chills, unintended weight changes or night sweats. A complete review of systems is obtained and is otherwise negative.  PHYSICAL EXAM:  height is 5' 5.5" (1.664 m) and weight is 148 lb (67.132 kg). Her oral temperature is 98.2 F (36.8 C). Her blood pressure is 116/62 and her pulse is 72. Her respiration is 18 and oxygen saturation is 98%.   Pain scale 4/10 in right foot In general this is a well appearing African-American in no acute distress. she is alert and oriented x4 and appropriate throughout the examination. HEENT reveals that the patient is normocephalic, atraumatic. EOMs are intact. PERRLA. Skin  is intact without any evidence of gross lesions. Cardiovascular exam reveals a regular rate and rhythm, no clicks rubs or murmurs are auscultated. Chest is clear to auscultation bilaterally. Lymphatic assessment is performed and does not reveal any adenopathy in the cervical, supraclavicular, axillary, or inguinal chains. Abdomen has active bowel sounds in all quadrants and is intact. The abdomen is soft, non tender, non distended. Lower extremities are negative for pretibial pitting edema, deep calf tenderness, cyanosis or clubbing.  Neurologic assessment is performed and her speech is fluent with normal language production. Cranial nerves are grossly assessed, visual acuity is grossly intact without focal fields are full. EOMs are intact bilaterally. Pupils are equal round and reactive to light and accommodation. Facial sensation intact, muscles of mastication are full and symmetric. Hearing is grossly intact without impairment and intact conversational speech. Reflexes not tested. Voices without any worse. Palate elevates symmetrically. She has dental prostheses in place. His full and symmetric. Tongue is midline without fasciculation. Strength is 5 out of 5 in left upper and lower extremities with normal muscle mass and tone in all extremities. On the right, is 4 out of 5 in the upper extremity and 3 out of 5 in the lower extremity. She is not able to dorsiflex her plantar flex her right foot against resistance. She is unable to wiggle her toes. Sensory is intact to light touch, and reflexes are tested in the biceps, brachioradialis, and patellar regions and are 2+ and symmetric bilaterally. Gait and coordination are not assessed.  ECOG = 2  0 - Asymptomatic (Fully active, able to carry on all predisease activities without restriction)  1 - Symptomatic but completely ambulatory (Restricted in physically strenuous activity but ambulatory and able to carry out work of a light or sedentary nature. For  example, light housework, office work)  2 - Symptomatic, <50% in bed during the day (Ambulatory and capable of all self care but unable to carry out any work activities. Up and about more than 50% of waking hours)  3 - Symptomatic, >50% in bed, but not bedbound (Capable of only limited self-care, confined to bed or chair 50% or more of waking hours)  4 - Bedbound (  Completely disabled. Cannot carry on any self-care. Totally confined to bed or chair)  5 - Death   Eustace Pen MM, Creech RH, Tormey DC, et al. [phone]). "Toxicity and response criteria of the [facility]". [place] Oncol. 5 (6): 649-55    LABORATORY DATA:  Lab Results  Component Value Date   WBC 6.0 09/13/2015   HGB 10.8* 09/13/2015   HCT 33.2* 09/13/2015   MCV 83.0 09/13/2015   PLT 360 09/13/2015   Lab Results  Component Value Date   NA 140 09/15/2015   K 4.1 09/15/2015   CL 112* 09/15/2015   CO2 20* 09/15/2015   Lab Results  Component Value Date   ALT 15 09/15/2015   AST 19 09/15/2015   ALKPHOS 49 09/15/2015   BILITOT 0.4 09/15/2015      RADIOGRAPHY: Dg Lumbar Spine Complete  09/08/2015  CLINICAL DATA:  Low back pain radiating into the left leg with weakness and numbness, recent fall EXAM: LUMBAR SPINE - COMPLETE 4+ VIEW COMPARISON:  Coronal and sagittal reconstructed images through the lumbar spine from a CT scan dated March 03, 2015. Chest x-ray of January 01, 2015 for purposes of vertebral level labeling. FINDINGS: The twelfth ribs are hypoplastic. L5 is transitional. The lumbar vertebral bodies are preserved in height. There is grade 1 anterolisthesis of L3 with respect L4 with mild disc space narrowing. There is moderate disc space narrowing at L4-5. There is no compression fracture. The pedicles and transverse processes appear intact. There is a pseudoarthrosis on the left at L5. IMPRESSION: There is degenerative disc space narrowing with grade 1 anterolisthesis of L3 with respect L4. There is  degenerative disc space narrowing at L4-5. There is no compression fracture. If the patient's radicular symptoms persist, MRI would be a useful next imaging step. Electronically Signed   By: [name]  Martinique M.D.   On: 09/08/2015 13:11   Dg Abd 1 View  09/13/2015  CLINICAL DATA:  Weakness.  Rule out metallic stent. EXAM: ABDOMEN - 1 VIEW COMPARISON:  CT 03/03/2015 FINDINGS: Colonic stent noted 03/03/2015 is no longer visualized. Bowel sutures seen over the pelvis and central abdomen. Moderate stool volume without obstruction. IMPRESSION: No metallic stent over the colon. Electronically Signed   By: Monte Fantasia M.D.   On: 09/13/2015 01:15   Mr [name] [id number] Contrast  09/13/2015  CLINICAL DATA:  Right lower extremity weakness. History of metastatic colon cancer. EXAM: MRI HEAD WITHOUT AND WITH CONTRAST TECHNIQUE: Multiplanar, multiecho pulse sequences of the brain and surrounding structures were obtained without and with intravenous contrast. CONTRAST:  65mL MULTIHANCE GADOBENATE DIMEGLUMINE 529 MG/ML IV SOLN COMPARISON:  None. FINDINGS: The study is mildly motion degraded. There is no evidence of acute infarct, intracranial hemorrhage, midline shift, or extra-axial fluid collection. Ventricles and sulci are normal for age. No significant chronic white matter disease is seen. Within the high medial left frontoparietal region, there is a 1.7 x 1.6 x 1.4 cm intra-axial mass with predominantly peripheral enhancement which is thickest along its lateral margin. There is moderate surrounding vasogenic edema with local sulcal effacement. No other enhancing brain lesions are identified. Orbits are unremarkable. A tiny left maxillary sinus mucous retention cyst is noted. The mastoid air cells are clear. Major intracranial vascular flow voids are preserved. No suspicious skull lesions are identified. IMPRESSION: 1.7 cm left frontoparietal mass with moderate vasogenic edema, most consistent with solitary metastasis. No  midline shift. Electronically Signed   By: Seymour Bars.D.  On: 09/13/2015 08:43   Mr Lumbar Spine Wo Contrast  09/13/2015  CLINICAL DATA:  Initial evaluation for worsening right leg numbness and weakness. EXAM: MRI LUMBAR SPINE WITHOUT CONTRAST TECHNIQUE: Multiplanar, multisequence MR imaging of the lumbar spine was performed. No intravenous contrast was administered. COMPARISON:  Prior study from 09/08/2015. FINDINGS: Transitional lumbosacral anatomy present with partial sacralization of the L5 vertebral body. Mild levoscoliosis of the lumbar spine present. There is trace anterolisthesis of L3 on L4. Vertebral bodies otherwise normally aligned with preservation of the normal lumbar lordosis. Vertebral body heights well preserved. Signal intensity within the vertebral body bone marrow is normal. Benign hemangioma noted within the T12 vertebral body. No other focal osseous lesions. No marrow edema. Conus medullaris terminates normally at the L1 level. Signal intensity within the visualized cord is normal. Nerve roots of the cauda equina within normal limits. Small amount of presacral free fluid noted, as seen on sagittal STIR sequence (series 5, image 7). This is of uncertain significance. No other acute soft tissue abnormality. Small T2 hyperintense cyst present within the right kidney. Gallbladder within normal limits. No pathologically enlarged retroperitoneal lymph nodes identified. Shotty subcentimeter left periaortic nodes noted. L1-2: Minimal annular disc bulge without focal disc herniation. No stenosis. L2-3: Mild diffuse disc bulge with disc desiccation. No focal disc herniation. Small annular fissure posteriorly. Mild facet and ligamentum flavum hypertrophy. No significant canal stenosis. Foramina are widely patent. L3-4: Trace anterolisthesis of L3 on L4. Mild diffuse disc bulge with disc desiccation. Associated annular fissure present posteriorly. Right greater than left facet arthrosis. 12 mm  synovial cyst at the posterior aspect of the right L3-4 facet. There is mild to moderate canal and lateral recess stenosis at this level. Superimposed right foraminal disc protrusion (series 7, image 20). Protruding disc closely approximates the exiting right L3 nerve root in the right neural foramen (series 4, image 5). This could potentially resultant right lower extremity radicular symptoms. Moderate right foraminal stenosis. No significant left foraminal narrowing. L4-5: Degenerative intervertebral disc space narrowing with endplate changes. Diffuse disc bulge without focal disc herniation. Facet and ligamentum flavum hypertrophy. Mild canal and lateral recess stenosis bilaterally. No significant foraminal narrowing. L5-S1: Transitional lumbosacral anatomy with rudimentary L5-S1 disc. No stenosis. IMPRESSION: 1. Trace anterolisthesis of L3 on L4 with associated disc bulge and superimposed shallow right foraminal disc protrusion. The protruding disc closely approximates the exiting right L3 nerve root, and could potentially result in right lower extremity radicular symptoms. Mild to moderate canal and moderate right foraminal stenosis at this level. 2. Degenerative disc bulge and facet disease at L4-5 with resultant mild canal stenosis. 3. Mild degenerative disc bulge at L2-3 without stenosis or neural impingement. 4. Transitional lumbosacral anatomy with sacralization of the L5 vertebral body. Careful correlation with numbering system on this study as well as plain film radiographs recommended prior to any potential future surgical intervention. 5. Small amount of presacral free fluid/edema, of uncertain significance. Electronically Signed   By: Jeannine Boga M.D.   On: 09/13/2015 04:58   Mr Cervical Spine W Wo Contrast  09/13/2015  CLINICAL DATA:  Worsening right leg numbness weakness. History of metastatic colon cancer. No acute injury or prior relevant surgery. EXAM: MRI CERVICAL SPINE WITHOUT AND  WITH CONTRAST; MRI THORACIC SPINE WITHOUT AND WITH CONTRAST TECHNIQUE: Multiplanar and multiecho pulse sequences of the cervical spine, to include the craniocervical junction and cervicothoracic junction, were obtained according to standard protocol without and with intravenous contrast.; Multiplanar and multiecho pulse sequences  of the thoracic spine were obtained without and with intravenous contrast. CONTRAST:  74mL MULTIHANCE GADOBENATE DIMEGLUMINE 529 MG/ML IV SOLN COMPARISON:  Lumbar MRI same date.  Chest CT 03/03/2015. FINDINGS: Scan quality is mildly degraded by motion, especially on the post-contrast images. CERVICAL SPINE FINDINGS: Alignment: There is 4 mm of anterolisthesis at C7-T1. There is also a minimal anterolisthesis at C6-7. Bones: No acute or suspicious osseous findings. There are facet degenerative changes throughout the cervical spine and mild endplate degenerative changes. Cord: Normal in signal and caliber. No abnormal intradural enhancement. Posterior Fossa: Visualized portions unremarkable. Vertebral Arteries: Bilateral vertebral artery flow voids. Paraspinal tissues: Unremarkable. Disc levels: No significant disc space findings at or above C3-4. C4-5: Loss of disc height with mild uncinate spurring and facet hypertrophy. No cord deformity or foraminal compromise. C5-6: Loss of disc height with posterior osteophytes, uncinate spurring and facet hypertrophy. There is asymmetric mild to moderate right foraminal narrowing. No cord deformity. C6-7: Mild disc bulging, uncinate spurring and facet hypertrophy. No cord deformity or significant foraminal compromise. C7-T1: Advanced facet hypertrophy accounts for the grade 1 anterolisthesis. There is mild disc bulging contributing to mild foraminal narrowing bilaterally. No cord deformity. THORACIC SPINE FINDINGS: Alignment: Mild scoliosis. Anterolisthesis at C7-T1 as described above. Otherwise normal. Bones: No acute or suspicious osseous findings.  No evidence of osseous metastatic disease. T12 hemangioma noted. Cord:  The thoracic cord appears normal in signal and caliber. Paraspinal and other soft tissues: No significant paraspinal abnormalities. Multiple hepatic lesions are partially imaged, corresponding with known hemangiomas and metastatic disease on prior CT. Disc levels: No significant thoracic disc space findings. Specifically, no evidence of disc herniation, spinal stenosis or nerve root encroachment. IMPRESSION: 1. No evidence of metastatic disease within the cervical thoracic spine. 2. Prominent anterolisthesis at C7-T1 due to facet disease. No resulting cord deformity or high-grade foraminal narrowing. 3. Mild additional cervical spondylosis without high-grade spinal stenosis or nerve root encroachment. No significant thoracic disc space findings. 4. Limited visualization of known hepatic lesions. Electronically Signed   By: Richardean Sale M.D.   On: 09/13/2015 08:53   Mr Thoracic Spine W Wo Contrast  09/13/2015  CLINICAL DATA:  Worsening right leg numbness weakness. History of metastatic colon cancer. No acute injury or prior relevant surgery. EXAM: MRI CERVICAL SPINE WITHOUT AND WITH CONTRAST; MRI THORACIC SPINE WITHOUT AND WITH CONTRAST TECHNIQUE: Multiplanar and multiecho pulse sequences of the cervical spine, to include the craniocervical junction and cervicothoracic junction, were obtained according to standard protocol without and with intravenous contrast.; Multiplanar and multiecho pulse sequences of the thoracic spine were obtained without and with intravenous contrast. CONTRAST:  56mL MULTIHANCE GADOBENATE DIMEGLUMINE 529 MG/ML IV SOLN COMPARISON:  Lumbar MRI same date.  Chest CT 03/03/2015. FINDINGS: Scan quality is mildly degraded by motion, especially on the post-contrast images. CERVICAL SPINE FINDINGS: Alignment: There is 4 mm of anterolisthesis at C7-T1. There is also a minimal anterolisthesis at C6-7. Bones: No acute or  suspicious osseous findings. There are facet degenerative changes throughout the cervical spine and mild endplate degenerative changes. Cord: Normal in signal and caliber. No abnormal intradural enhancement. Posterior Fossa: Visualized portions unremarkable. Vertebral Arteries: Bilateral vertebral artery flow voids. Paraspinal tissues: Unremarkable. Disc levels: No significant disc space findings at or above C3-4. C4-5: Loss of disc height with mild uncinate spurring and facet hypertrophy. No cord deformity or foraminal compromise. C5-6: Loss of disc height with posterior osteophytes, uncinate spurring and facet hypertrophy. There is asymmetric mild to moderate right  foraminal narrowing. No cord deformity. C6-7: Mild disc bulging, uncinate spurring and facet hypertrophy. No cord deformity or significant foraminal compromise. C7-T1: Advanced facet hypertrophy accounts for the grade 1 anterolisthesis. There is mild disc bulging contributing to mild foraminal narrowing bilaterally. No cord deformity. THORACIC SPINE FINDINGS: Alignment: Mild scoliosis. Anterolisthesis at C7-T1 as described above. Otherwise normal. Bones: No acute or suspicious osseous findings. No evidence of osseous metastatic disease. T12 hemangioma noted. Cord:  The thoracic cord appears normal in signal and caliber. Paraspinal and other soft tissues: No significant paraspinal abnormalities. Multiple hepatic lesions are partially imaged, corresponding with known hemangiomas and metastatic disease on prior CT. Disc levels: No significant thoracic disc space findings. Specifically, no evidence of disc herniation, spinal stenosis or nerve root encroachment. IMPRESSION: 1. No evidence of metastatic disease within the cervical thoracic spine. 2. Prominent anterolisthesis at C7-T1 due to facet disease. No resulting cord deformity or high-grade foraminal narrowing. 3. Mild additional cervical spondylosis without high-grade spinal stenosis or nerve root  encroachment. No significant thoracic disc space findings. 4. Limited visualization of known hepatic lesions. Electronically Signed   By: Richardean Sale M.D.   On: 09/13/2015 08:53   Dg Foot Complete Right  09/12/2015  CLINICAL DATA:  Right foot numbness. Recent falls. Numbness in the right lower extremity. EXAM: RIGHT FOOT COMPLETE - 3+ VIEW COMPARISON:  None. FINDINGS: There is no evidence of fracture or dislocation. There is no evidence of arthropathy or other focal bone abnormality. Soft tissues are unremarkable. Prominent calcification and spurring is noted at the attachment of the Achilles tendon. IMPRESSION: 1. No acute abnormality. 2. Prominent calcification and spurring at the attachment of the Achilles tendon. Electronically Signed   By: San Morelle M.D.   On: 09/12/2015 15:15       IMPRESSION:  65 year old female with a past medical history of stage IV colon cancer, with a solitary brain metastasis.   PLAN: Dr. Lisbeth Renshaw has evaluated the patient medical record, and reviewed her imaging studies as well as discuss her case with Dr. Saintclair Halsted. At this point in time we would offer the patient SRS to the brain lesion in one fraction. She is interested in moving forward after we discussed the risks, benefits and long and short term effects of radiotherapy. We discussed the need for a T3 MRI with SRS protocol to be performed at [facility] prior to moving forward with simulation. We will try to coordinate her MRI stat today, with CT simulation tomorrow in our department. The patient states agreement and understanding of this. We discussed that we would attempt to move forward with her actual treatment on either Thursday or Friday of this week. She is in agreement and states understanding. Written consent is obtained for her simulation subsequent treatment.   Carola Rhine, PAC

## 2015-09-15 NOTE — Telephone Encounter (Signed)
Dr Burr Medico knows about this pt & will go see her tonight.

## 2015-09-15 NOTE — Progress Notes (Signed)
[name] [name]   DOB:12/03/1950   BT#:[id number]   XHF#:[id number]  Subjective: Pt is well-known to me, I have been following her for metastatic colon cancer, she has been off chemo and on observation since 10/2014. She presented with b/l leg weakness and multiple fall at home, admitted on 3/11 and MRI showed a solitary 1.2cm mets in frontoparietal vertex with vasogenic edema. Her leg weakness has improved with steroids, she walked with PT today, in good sprit overall, I also met her daughter in her room.    Objective:  Filed Vitals:   09/15/15 1320 09/15/15 2149  BP: 116/62 119/54  Pulse: 72 75  Temp: 98.2 F (36.8 C) 98.6 F (37 C)  Resp: 18 18    Body mass index is 24.25 kg/(m^2).  Intake/Output Summary (Last 24 hours) at 09/15/15 2244 Last data filed at 09/15/15 2200  Gross per 24 hour  Intake   3600 ml  Output   4650 ml  Net  -1050 ml     Sclerae unicteric  Oropharynx clear  No peripheral adenopathy  Lungs clear -- no rales or rhonchi  Heart regular rate and rhythm  Abdomen benign  MSK no focal spinal tenderness, no peripheral edema  Neuro (+) bilateral leg weakness 4/5     CBG (last 3)  No results for input(s): GLUCAP in the last 72 hours.   Labs:  Lab Results  Component Value Date   WBC 6.0 09/13/2015   HGB 10.8* 09/13/2015   HCT 33.2* 09/13/2015   MCV 83.0 09/13/2015   PLT 360 09/13/2015   NEUTROABS 3.3 09/13/2015    _0 @  Urine Studies No results for input(s): UHGB, CRYS in the last 72 hours.  Invalid input(s): UACOL, UAPR, USPG, UPH, UTP, UGL, UKET, UBIL, UNIT, UROB, ULEU, UEPI, UWBC, URBC, UBAC, CAST, [place], [place]  Basic Metabolic Panel:  Recent Labs Lab 09/13/15 0604 09/14/15 0406 09/14/15 0812 09/15/15 0637  NA 142  --  140 140  K 4.2  --  4.2 4.1  CL 108  --  110 112*  CO2 26  --  19* 20*  GLUCOSE 102*  --  147* 134*  BUN 10  --  10 13  CREATININE 0.78  --  0.65 0.60  CALCIUM 11.5* 11.5* 11.1* 10.6*   GFR Estimated  Creatinine Clearance: 66.1 mL/min (by C-G formula based on Cr of 0.6). Liver Function Tests:  Recent Labs Lab 09/14/15 0812 09/15/15 0637  AST 23 19  ALT 15 15  ALKPHOS 50 49  BILITOT 0.2* 0.4  PROT 7.5 7.1  ALBUMIN 3.7 3.4*   No results for input(s): LIPASE, AMYLASE in the last 168 hours. No results for input(s): AMMONIA in the last 168 hours. Coagulation profile  Recent Labs Lab 09/13/15 0604  INR 1.72*    CBC:  Recent Labs Lab 09/13/15 0604  WBC 6.0  NEUTROABS 3.3  HGB 10.8*  HCT 33.2*  MCV 83.0  PLT 360   Cardiac Enzymes: No results for input(s): CKTOTAL, CKMB, CKMBINDEX, TROPONINI in the last 168 hours. BNP: Invalid input(s): POCBNP CBG: No results for input(s): GLUCAP in the last 168 hours. D-Dimer No results for input(s): DDIMER in the last 72 hours. Hgb A1c No results for input(s): HGBA1C in the last 72 hours. Lipid Profile No results for input(s): CHOL, HDL, LDLCALC, TRIG, CHOLHDL, LDLDIRECT in the last 72 hours. Thyroid function studies  Recent Labs  09/13/15 1202  TSH 0.844   Anemia work up No results for input(s): VITAMINB12, FOLATE,  FERRITIN, TIBC, IRON, RETICCTPCT in the last 72 hours. Microbiology No results found for this or any previous visit (from the past 240 hour(s)).    Studies:  Mr Kizzie Fantasia Contrast  09/15/2015  CLINICAL DATA:  Metastatic brain tumor. S RS protocol. Metastatic colon cancer. EXAM: MRI HEAD WITHOUT AND WITH CONTRAST TECHNIQUE: Multiplanar, multiecho pulse sequences of the brain and surrounding structures were obtained without and with intravenous contrast. CONTRAST:  15 cc MultiHance COMPARISON:  09/13/2015 FINDINGS: Enhancing mass in the medial aspect at the left frontoparietal junction at the vertex measures 12 mm front to back, 10 mm cephalocaudal an 8 mm right to left. There is surrounding vasogenic edema. No second metastatic lesion is seen. There is a 7 mm meningioma at the left posterior frontal vertex. No  evidence of ischemic infarction. No hydrocephalus or extra-axial collection. No skull or skullbase lesion. IMPRESSION: 12 x 10 x 8 mm solitary brain metastasis in the medial aspect of the brain at the frontoparietal vertex. Moderate regional vasogenic edema. 7 mm meningioma at the lateral posterior frontal vertex. Electronically Signed   By: Nelson Chimes M.D.   On: 09/15/2015 17:29    Assessment: 65 y.o.   1. New solitary 1.2cm brain metastasis  2. B/l leg weakness, secondary to #1and fall  3. Metastatic colon cancer, currently off chemo, on observation, per pt's choice  4. Hypercalcemia, secondary to #3 5. History of PE, on Xaralto  6. Anemia   Plan:  -I have reviewed her lab and scan findings and discussed with her -I agree with SBRT to solitary brain met, which is scheduled for later this week by Dr. Lisbeth Renshaw. Steroids tapering per Dr. Lisbeth Renshaw  -she will need PT/OT, pt prefers home PT if possible -will discuss with SW to see if we can get her a wheelchair with a seat upon her discharge  -I will follow up after her discharge. I will obtain a restaging CT in outpt to see if she would need any palliative radiation. She has previously declined chemo. She understands the incurable nature of her cancer, and prefers palliative approach to preserve her quality of life.   I will follow up in house as needed.    Truitt Merle, MD 09/15/2015  10:44 PM

## 2015-09-15 NOTE — Consult Note (Signed)
Physical Medicine and Rehabilitation Consult Reason for Consult: RLE and RUE weakness due to left brain metastatic mass Referring Physician: Dr. Candiss Norse.    HPI: [name] [name] is a 65 y.o. female with history of stage IV colon cancer with mets to liver and peritoneum--declined chemo due to concerns of SE, anemia, PE-on Xarelto who was admitted on 09/13/15 with complaints of RUE ataxia, RLE weakness with difficulty walking/falls  and RLE paraesthesias. MRI brain done revealing 1.7 cm left frontoparietal mass with moderate vasogenic edema consistent with solitary metastasis. MRI lumbar spine with L3 on L4 anterolisthesis with mild to moderate canal and right foraminal stenosis with protrusion of disc close to L3 nerve root. MRI thoracic and cervical spine negative for metastatic disease. She was placed on steroids for edema management and SRS recommended by Dr. Saintclair Halsted due to concerns of mass located within or close to motor strip and risk of permanent RLE motor deficits. Hypercalcemia noted and she was started on IV lasix, fluids and calcitonin for treatment.  Dr. Lisbeth Renshaw consulted and patient to undergo CT simulation tomorrow with [facility] to brain lesion on Thurs or Fri. Dr. Feng/Hem/Onc consulted and patient has elected on palliative radiation only. PT ongoing and patient continues to be limited by LLE instability and ataxia, LUE weakness. CIR recommended for follow up therapy.     Review of Systems  HENT: Negative for hearing loss.   Eyes: Negative for blurred vision and double vision.  Respiratory: Negative for cough and shortness of breath.   Cardiovascular: Negative for chest pain and palpitations.  Gastrointestinal: Positive for heartburn. Negative for nausea and constipation.  Genitourinary: Negative for dysuria and urgency.  Musculoskeletal: Positive for joint pain (left hip pain). Negative for myalgias and back pain.  Neurological: Positive for focal weakness. Negative for dizziness,  tingling, sensory change, speech change and headaches.  Psychiatric/Behavioral: Negative for depression. The patient is not nervous/anxious.   All other systems reviewed and are negative.  Past Medical History  Diagnosis Date  . Colon cancer ([place])     dx'd 2014  . Heart murmur     ? as a child   . Pulmonary embolism ([place]) 11/2014   . Anemia     hx of     Past Surgical History  Procedure Laterality Date  . Abdominal hysterectomy  2005  . Cesarean section  1976  . Flexible sigmoidoscopy N/A 05/25/2013    Procedure: FLEXIBLE SIGMOIDOSCOPY;  Surgeon: [name], MD;  Location: [facility];  Service: Endoscopy;  Laterality: N/A;  needs floro  . Colonic stent placement N/A 05/25/2013    Procedure: COLONIC STENT PLACEMENT;  Surgeon: [name], MD;  Location: [facility];  Service: Endoscopy;  Laterality: N/A;  . Portacath placement Right 06/25/2013    Procedure: ULTRA SOUND GUIDED INSERTION PORT-A-CATH;  Surgeon: [name], MD;  Location: [place];  Service: General;  Laterality: Right;  . Flexible sigmoidoscopy N/A 08/19/2014    Procedure: FLEXIBLE SIGMOIDOSCOPY;  Surgeon: Gatha Mayer, MD;  Location: [facility];  Service: Endoscopy;  Laterality: N/A;  . Colonic stent placement N/A 08/19/2014    Procedure: COLONIC STENT PLACEMENT;  Surgeon: Gatha Mayer, MD;  Location: [facility];  Service: Endoscopy;  Laterality: N/A;  . Flexible sigmoidoscopy N/A 08/19/2014    Procedure: FLEXIBLE SIGMOIDOSCOPY;  Surgeon: Gatha Mayer, MD;  Location: [facility];  Service: Endoscopy;  Laterality: N/A;  . Colonic stent placement N/A 08/19/2014    Procedure:  COLONIC STENT PLACEMENT;  Surgeon: Gatha Mayer, MD;  Location: [facility];  Service: Endoscopy;  Laterality: N/A;  . Colon resection N/A 10/28/2014    Procedure: Transverse loop colostomy;  Surgeon: [name], MD;  Location: [facility];  Service: General;  Laterality: N/A;  . Colostomy revision N/A  01/03/2015    Procedure: REVISION OF TRANSVERSE LOOP COLOSTOMY WITH PARTIAL  COLECTOMY;  Surgeon: [name], MD;  Location: [facility];  Service: General;  Laterality: N/A;  . Laparoscopic low anterior resection  03/06/2015    03/06/2015  . Laparoscopic splenectomy  03/06/2015  . Colon resection N/A 03/06/2015    Procedure: LAPAROSCOPIC takedown loop colostomy ;  Surgeon: Michael Boston, MD;  Location: [facility];  Service: General;  Laterality: N/A;  . 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 Bowel resection  03/06/2015    Procedure: LOW ANTERIOR BOWEL RESECTION, rigid proctoscopy;  Surgeon: Michael Boston, MD;  Location: [facility];  Service: General;;  . Polypectomy  03/06/2015    Procedure: transverse POLYPECTOMY x 4;  Surgeon: Michael Boston, MD;  Location: [facility];  Service: General;;    Family History  Problem Relation Age of Onset  . Colon cancer Sister 52  . Diabetes Neg Hx   . Thyroid disease Neg Hx   . Breast cancer Maternal Aunt   . Prostate cancer Maternal Uncle   . Bone cancer Maternal Grandfather   . Prostate cancer Maternal Uncle     Social History:   Married--used to work as an Engineer, production. Retired in 2104.  Husband works here but she has a good support system that can assist after discharge.  Independent and was using cane for 2 weeks prior to admission. She  reports that she quit smoking about 44 years ago. Her smoking use included Cigarettes. She quit after 1 year of use. She has never used smokeless tobacco. She reports that she does not drink alcohol or use illicit drugs.   Allergies  Allergen Reactions  . Oxycontin [Oxycodone Hcl] Anaphylaxis, Hives and Itching  . Codeine Itching and Nausea And Vomiting    Medications Prior to Admission  Medication Sig Dispense Refill  . acetaminophen (TYLENOL) 500 MG tablet Take 500 mg by mouth every 6 (six) hours as needed for moderate pain or  headache.     . Cyanocobalamin (VITAMIN B 12 PO) Take 1 tablet by mouth every morning.    Marland Kitchen dextromethorphan (DELSYM) 30 MG/5ML liquid Take 10 mLs by mouth every 12 (twelve) hours as needed for cough.    . diphenhydrAMINE (BENADRYL) 25 MG tablet Take 25 mg by mouth every 6 (six) hours as needed (cough).    . gabapentin (NEURONTIN) 300 MG capsule Take 300 mg by mouth 3 (three) times daily.  2  . IRON PO Take 1 tablet by mouth every morning.    . polyethylene glycol powder (GLYCOLAX/MIRALAX) powder TAKE 34 G (2 DOSES) BY MOUTH DAILY AS NEEDED FOR MILD CONSTIPATION OR MODERATE CONSTIPATION 255 g 0  . rivaroxaban (XARELTO) 20 MG TABS tablet Take 1 tablet (20 mg total) by mouth daily with supper. 30 tablet 3  . lidocaine-prilocaine (EMLA) cream Apply 1 tsp to site one hour before use, cover with plastic wrap. 1 each PRN    Home: Home Living Family/patient expects to be discharged to:: Private residence Living Arrangements: Spouse/significant other Available Help at Discharge: Family, Available PRN/intermittently (husband works)  Type of Home: House Home Layout: One level Home Equipment: Cane - single point  Functional History: Prior Function Level of Independence: Independent with assistive device(s) Comments: independent with cane prior to last 2 wks Functional Status:  Mobility: Bed Mobility Overal bed mobility: Needs Assistance Bed Mobility: Supine to Sit, Sit to Supine Supine to sit: Min assist Sit to supine: Min assist General bed mobility comments: Min assist to help progress right leg to EOB and lift right leg back into bed.  Extra time needed to complete tasks Transfers Overall transfer level: Needs assistance Equipment used: Rolling walker (2 wheeled) Transfers: Sit to/from Stand, [employer] Transfers Sit to Stand: Min assist, +2 safety/equipment Stand pivot transfers: Min assist, +2 safety/equipment General transfer comment: Min assist two person for safety/line management.   Pt needs verbal and tactile cues for safe hand placement. Knee immobilized donned in bed prior to mobilizing.   Ambulation/Gait Ambulation/Gait assistance: Mod assist, +2 safety/equipment Ambulation Distance (Feet): 25 Feet Assistive device: Rolling walker (2 wheeled) Gait Pattern/deviations: Step-to pattern, Decreased dorsiflexion - right, Decreased weight shift to right, Steppage, Scissoring, Ataxic General Gait Details: Pt with decreased right leg coordination. Still buckling despite KI use.  Smaller steps and cues for terminal knee extension help some until fatigued.  Manual assist to keep right hand on RW as well. Chair to follow for safety Gait velocity: decreased    ADL:    Cognition: Cognition Overall Cognitive Status: Impaired/Different from baseline Orientation Level: Oriented X4 Cognition Arousal/Alertness: Awake/alert Behavior During Therapy: Impulsive Overall Cognitive Status: Impaired/Different from baseline Area of Impairment: Safety/judgement, Awareness Current Attention Level: Sustained Safety/Judgement: Decreased awareness of safety, Decreased awareness of deficits Awareness: Emergent Problem Solving: Difficulty sequencing, Requires verbal cues, Requires tactile cues General Comments: right sided inattention, but better awareness.  A bit impulsive and quick to move especially when going to stand and scooting EOB.    Blood pressure 127/55, pulse 75, temperature 98.3 F (36.8 C), temperature source Oral, resp. rate 18, height 5' 5.5" (1.664 m), weight 67.132 kg (148 lb), SpO2 97 %. Physical Exam  Nursing note and vitals reviewed. Constitutional: She is oriented to person, place, and time. She appears well-developed and well-nourished.  HENT:  Head: Normocephalic and atraumatic.  Mouth/Throat: Oropharynx is clear and moist.  Eyes: Conjunctivae and EOM are normal. Pupils are equal, round, and reactive to light.  Neck: Normal range of motion. Neck supple.    Cardiovascular: Normal rate and regular rhythm.   No murmur heard. Respiratory: Effort normal and breath sounds normal. No stridor. No respiratory distress. She has no wheezes.  GI: Soft. Bowel sounds are normal. She exhibits no distension. There is no tenderness.  Musculoskeletal: She exhibits no edema or tenderness.  Neurological: She is alert and oriented to person, place, and time.  Speech clear.  Follows basic motor commands without difficulty.   Motor LUE/LLE 5/5. Has some right inattention  RUE: deltoids 3+/5, biceps 4/5, triceps 4/5, intrinsics 4+/5.  RLE: HF 4-/5, KE 2/5, PF/DF 0/5.  Sensation intact to light touch DTRs 3+ throughout  Skin: Skin is warm and dry.  Psychiatric: She has a normal mood and affect. Her behavior is normal. Judgment and thought content normal.    Results for orders placed or performed during the hospital encounter of 09/12/15 (from the past 24 hour(s))  Comprehensive metabolic panel     Status: Abnormal   Collection Time: 09/16/15  4:55 AM  Result Value Ref Range   Sodium 141 135 - 145 mmol/L  Potassium 4.2 3.5 - 5.1 mmol/L   Chloride 109 101 - 111 mmol/L   CO2 23 22 - 32 mmol/L   Glucose, Bld 125 (H) 65 - 99 mg/dL   BUN 14 6 - 20 mg/dL   Creatinine, Ser 0.49 0.44 - 1.00 mg/dL   Calcium 11.4 (H) 8.9 - 10.3 mg/dL   Total Protein 7.1 6.5 - 8.1 g/dL   Albumin 3.3 (L) 3.5 - 5.0 g/dL   AST 27 15 - 41 U/L   ALT 22 14 - 54 U/L   Alkaline Phosphatase 53 38 - 126 U/L   Total Bilirubin 0.5 0.3 - 1.2 mg/dL   GFR calc non Af Amer >60 >60 mL/min   GFR calc Af Amer >60 >60 mL/min   Anion gap 9 5 - 15   Mr [name] Wo Contrast  09/15/2015  CLINICAL DATA:  Metastatic brain tumor. S RS protocol. Metastatic colon cancer. EXAM: MRI HEAD WITHOUT AND WITH CONTRAST TECHNIQUE: Multiplanar, multiecho pulse sequences of the brain and surrounding structures were obtained without and with intravenous contrast. CONTRAST:  15 cc MultiHance COMPARISON:  09/13/2015  FINDINGS: Enhancing mass in the medial aspect at the left frontoparietal junction at the vertex measures 12 mm front to back, 10 mm cephalocaudal an 8 mm right to left. There is surrounding vasogenic edema. No second metastatic lesion is seen. There is a 7 mm meningioma at the left posterior frontal vertex. No evidence of ischemic infarction. No hydrocephalus or extra-axial collection. No skull or skullbase lesion. IMPRESSION: 12 x 10 x 8 mm solitary brain metastasis in the medial aspect of the brain at the frontoparietal vertex. Moderate regional vasogenic edema. 7 mm meningioma at the lateral posterior frontal vertex. Electronically Signed   By: Nelson Chimes M.D.   On: 09/15/2015 17:29    Assessment/Plan: Diagnosis: RLE and RUE weakness due to left brain metastatic mass Labs and images independently reviewed.  Records reviewed and summated above.   1. Does the need for close, 24 hr/day medical supervision in concert with the patient's rehab needs make it unreasonable for this patient to be served in a less intensive setting? Potentially  2. Co-Morbidities requiring supervision/potential complications: stage IV colon cancer with mets (declined chemo due to concerns of SE), anemia (transfuse if necessary to ensure appropriate perfusion for increased activity tolerance), PE (cont med), ataxia, Tachycardia (monitor in accordance with pain and increasing activity) 3. Due to safety, skin/wound care, disease management and patient education, does the patient require 24 hr/day rehab nursing? Yes 4. Does the patient require coordinated care of a physician, rehab nurse, PT (1-2 hrs/day, 5 days/week) and OT (1-2 hrs/day, 5 days/week) to address physical and functional deficits in the context of the above medical diagnosis(es)? Yes Addressing deficits in the following areas: balance, endurance, locomotion, strength, transferring, dressing, toileting and psychosocial support 5. Can the patient actively participate  in an intensive therapy program of at least 3 hrs of therapy per day at least 5 days per week? Yes 6. The potential for patient to make measurable gains while on inpatient rehab is excellent 7. Anticipated functional outcomes upon discharge from inpatient rehab are supervision  with PT, supervision with OT, n/a with SLP. 8. Estimated rehab length of stay to reach the above functional goals is: 14-17 days. 9. Does the patient have adequate social supports and living environment to accommodate these discharge functional goals? Yes 10. Anticipated D/C setting: Home 11. Anticipated post D/C treatments: HH therapy and Home excercise program 12.  Overall Rehab/Functional Prognosis: good and fair  RECOMMENDATIONS: This patient's condition is appropriate for continued rehabilitative care in the following setting: CIR Patient has agreed to participate in recommended program. Yes Note that insurance prior authorization may be required for reimbursement for recommended care.  Comment: Rehab Admissions Coordinator to follow up.  Delice Lesch, MD 09/16/2015

## 2015-09-15 NOTE — Progress Notes (Addendum)
Physical Therapy Treatment Patient Details Name: [name] [name] MRN: [id number] DOB: 04-Jul-1951 Today's Date: 09/15/2015    History of Present Illness 65 y.o. female with hx of colon CA, prior hx of PE on Xarelto, hx of anemia, lives at home with husband, presented with right leg paresthesia and weakness; Workup has revealed a solitary posterior left frontal mass.  Planned surgical intervention.    PT Comments    Pt is progressing with gait. The immobilizer helps some, however, it is more difficult for pt to swing her leg through to use steppage gait strategy with KI on and once she fatigues her right foot essentially gets stuck to the floor.  PT will continue to help progress gait and mobility.    Follow Up Recommendations  CIR;Supervision for mobility/OOB     Equipment Recommendations  Rolling walker with 5" wheels;Other (comment) (with hand splint to help hold hand on RW)    Recommendations for Other Services Rehab consult     Precautions / Restrictions Precautions Precautions: Fall Precaution Comments: right sided weakness and dyscoordination    Mobility  Bed Mobility Overal bed mobility: Needs Assistance Bed Mobility: Supine to Sit;Sit to Supine     Supine to sit: Min assist Sit to supine: Min assist   General bed mobility comments: Min assist to help progress right leg to EOB and lift right leg back into bed.  Extra time needed to complete tasks  Transfers Overall transfer level: Needs assistance Equipment used: Rolling walker (2 wheeled) Transfers: Sit to/from [facility] Sit to Stand: Min assist;+2 safety/equipment Stand pivot transfers: Min assist;+2 safety/equipment       General transfer comment: Min assist two person for safety/line management.  Pt needs verbal and tactile cues for safe hand placement. Knee immobilized donned in bed prior to mobilizing.    Ambulation/Gait Ambulation/Gait assistance: Mod assist;+2  safety/equipment Ambulation Distance (Feet): 25 Feet Assistive device: Rolling walker (2 wheeled) Gait Pattern/deviations: Step-to pattern;Decreased dorsiflexion - right;Decreased weight shift to right;Steppage;Scissoring;Ataxic Gait velocity: decreased   General Gait Details: Pt with decreased right leg coordination. Still buckling despite KI use.  Smaller steps and cues for terminal knee extension help some until fatigued.  Manual assist to keep right hand on RW as well. Chair to follow for safety          Balance Overall balance assessment: Needs assistance Sitting-balance support: Feet supported;No upper extremity supported Sitting balance-Leahy Scale: Good     Standing balance support: Single extremity supported;Bilateral upper extremity supported Standing balance-Leahy Scale: Poor                      Cognition Arousal/Alertness: Awake/alert Behavior During Therapy: Impulsive Overall Cognitive Status: Impaired/Different from baseline Area of Impairment: Safety/judgement;Awareness   Current Attention Level: Sustained     Safety/Judgement: Decreased awareness of safety;Decreased awareness of deficits Awareness: Emergent Problem Solving: Difficulty sequencing;Requires verbal cues;Requires tactile cues General Comments: right sided inattention, but better awareness.  A bit impulsive and quick to move especially when going to stand and scooting EOB.            Pertinent Vitals/Pain Pain Assessment: No/denies pain           PT Goals (current goals can now be found in the care plan section) Acute Rehab PT Goals Patient Stated Goal: to get better Progress towards PT goals: Progressing toward goals    Frequency  Min 3X/week    PT Plan Current plan remains appropriate  End of Session Equipment Utilized During Treatment: Gait belt Activity Tolerance: Patient limited by fatigue Patient left: in bed;with call bell/phone within reach;with bed alarm  set     Time: 1526-1540 PT Time Calculation (min) (ACUTE ONLY): 14 min  Charges:  $Gait Training: 8-22 mins                   Nikolus Marczak B. Annetta North, [place], DPT [phone]   09/15/2015, 4:07 PM

## 2015-09-15 NOTE — Telephone Encounter (Signed)
Patient calling from the hospital and would like Dr. Burr Medico to know she is in patient .  She is requesting that Dr. Burr Medico come and see her.

## 2015-09-16 ENCOUNTER — Encounter: Payer: Self-pay | Admitting: Radiation Oncology

## 2015-09-16 ENCOUNTER — Ambulatory Visit
Admit: 2015-09-16 | Discharge: 2015-09-16 | Disposition: A | Discharge status: Home / Self Care | Payer: Medicaid Other | Attending: Radiation Oncology | Admitting: Radiation Oncology

## 2015-09-16 VITALS — BP 122/72 | HR 66

## 2015-09-16 DIAGNOSIS — Z510 Encounter for antineoplastic radiation therapy: Secondary | ICD-10-CM | POA: Insufficient documentation

## 2015-09-16 DIAGNOSIS — C189 Malignant neoplasm of colon, unspecified: Secondary | ICD-10-CM

## 2015-09-16 DIAGNOSIS — C7931 Secondary malignant neoplasm of brain: Secondary | ICD-10-CM | POA: Insufficient documentation

## 2015-09-16 DIAGNOSIS — R29898 Other symptoms and signs involving the musculoskeletal system: Secondary | ICD-10-CM

## 2015-09-16 DIAGNOSIS — R270 Ataxia, unspecified: Secondary | ICD-10-CM | POA: Insufficient documentation

## 2015-09-16 DIAGNOSIS — C787 Secondary malignant neoplasm of liver and intrahepatic bile duct: Secondary | ICD-10-CM

## 2015-09-16 DIAGNOSIS — C801 Malignant (primary) neoplasm, unspecified: Secondary | ICD-10-CM | POA: Insufficient documentation

## 2015-09-16 DIAGNOSIS — R000 Tachycardia, unspecified: Secondary | ICD-10-CM

## 2015-09-16 DIAGNOSIS — D638 Anemia in other chronic diseases classified elsewhere: Secondary | ICD-10-CM

## 2015-09-16 DIAGNOSIS — I2782 Chronic pulmonary embolism: Secondary | ICD-10-CM | POA: Insufficient documentation

## 2015-09-16 LAB — COMPREHENSIVE METABOLIC PANEL
ALBUMIN: 3.3 g/dL — AB (ref 3.5–5.0)
ALT: 22 U/L (ref 14–54)
ANION GAP: 9 (ref 5–15)
AST: 27 U/L (ref 15–41)
Alkaline Phosphatase: 53 U/L (ref 38–126)
BUN: 14 mg/dL (ref 6–20)
CO2: 23 mmol/L (ref 22–32)
Calcium: 11.4 mg/dL — ABNORMAL HIGH (ref 8.9–10.3)
Chloride: 109 mmol/L (ref 101–111)
Creatinine, Ser: 0.49 mg/dL (ref 0.44–1.00)
GFR calc Af Amer: >60 mL/min (ref >60)
GFR calc non Af Amer: >60 mL/min (ref >60)
GLUCOSE: 125 mg/dL — AB (ref 65–99)
POTASSIUM: 4.2 mmol/L (ref 3.5–5.1)
SODIUM: 141 mmol/L (ref 135–145)
TOTAL PROTEIN: 7.1 g/dL (ref 6.5–8.1)
Total Bilirubin: 0.5 mg/dL (ref 0.3–1.2)

## 2015-09-16 LAB — CALCIUM, IONIZED: Calcium, Ionized, Serum: 6 mg/dL — ABNORMAL HIGH (ref 4.5–5.6)

## 2015-09-16 MED ORDER — SODIUM CHLORIDE 0.9 % IV SOLN
90.0000 mg | Freq: Once | INTRAVENOUS | Status: DC
  Filled 2015-09-16: qty 10

## 2015-09-16 NOTE — Progress Notes (Signed)
I will follow up with pt and family after palliative consult complete to clarify goals, etc. I will then discuss with pt if she would like to pursue intense three hours per day of inpt rehab admission or if goals are for other rehab venue options. [id number]

## 2015-09-16 NOTE — Progress Notes (Signed)
[facility] Transporting back to [facility].  VSS

## 2015-09-16 NOTE — Progress Notes (Signed)
Patient Demographics:    [name] [name], is a 65 y.o. 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 Johney Maine, under the care of Dr. Burr Medico oncologist, but could not tolerate chemotherapy, long-standing hypercalcemia also being treated by her oncologist, patient was admitted 3 days ago with chief complaints of right lower extremity weakness, workup eventually showed a large brain metastatic lesion, she was started on Decadron with improvement, oncology and radiation oncology along with neurosurgery were involved.   Patient's weakness has improved with Decadron she is now scheduled for stereotactic radiation treatments by radiation oncology, she is also being evaluated by PT for CIR placement, patient has now refused any further chemotherapy treatments and wants her care to be directed towards comfort. Palliative care will be involved as well. Currently goal is to improve her calcium and then discharged to either CIR or SNF.  Chief Complaint  Patient presents with  . Fall  . Extremity Weakness        Subjective:    [name] [name] today has, No headache, No chest pain, No abdominal pain - No Nausea, No new weakness tingling or numbness, No Cough - SOB. Much more improved right lower extremity weakness.   Assessment  & Plan :     1.Right leg weakness and tingling. Likely due to left frontoparietal brain metastases, MRI of the Brain and lumbar spine reviewed and discussed with neurosurgeon Dr.  Saintclair Halsted, symptoms likely due to the brain mass. She has shown improvement with IV steroids which will be continued but now twice a day instead of 4 times a day, we have also consulted Dr. Lisbeth Renshaw radiation oncology who is planning to start stereotactic treatments On 09/17/2015, PT on board, right knee immobilizer provided. Patient's oncologist Dr. Burr Medico also involved. This likely is metastatic colon cancer.  Review of supportive care, right knee immobilizer, PT, will require placement to either SNF or CIR.  2. History of metastatic colon cancer. With metastases to liver and peritoneum and now likely to the brain, status post resection by Dr. Johney Maine last year, follows with Dr. Burr Medico oncologist, has also received chemotherapy, Discussed with Dr. Burr Medico, patient has been noncompliant with chemotherapy, last CEA was high and has been trending up. She will follow with oncology outpatient, for now radiation oncology treatments as above.  3. Hypercalcemia. Likely due to malignancy. ACE level borderline high and PTH ++ high, pending PTH related peptide, calcium did not improve much with IV fluids, Lasix, and calcitonin, discussed with oncologist Dr. Burr Medico she recommends a dose of Zometa which will be ordered. Oncologist will address PTH levels and workup in the outpatient setting. Discussed with her.  4. History of PE. On xaralto continue. Discussed with neurosurgery okay to continue.  5. GERD. On PPI continue.    Code Status : Full  Family Communication  : None present  Disposition Plan  : SNF versus CIR, social work and CIR both consulted  Consults  :  Oncology Dr. Lars Masson, neurosurgery Dr. Saintclair Halsted, Radiation oncology Dr. Marland Mcalpine, [place]  Procedures  :   MRI brain. Left frontoparietal mass with edema.  MRI C, T and L-spine. L-spine disc protrusion with possible nerve impingement at L3 level. Multilevel DJD also noted.  DVT Prophylaxis  :  Xaralto  Lab Results  Component Value Date   PLT 360  09/13/2015    Inpatient Medications  Scheduled Meds: . baclofen  5 mg Oral TID  . dexamethasone  10 mg Intravenous Q12H  . gabapentin  300 mg Oral TID  . pantoprazole  40 mg Oral Q0600  . rivaroxaban  20 mg Oral Q supper  . vitamin B-12  100 mcg Oral Daily   Continuous Infusions:   PRN Meds:.acetaminophen, gadobenate dimeglumine, iohexol, iohexol, LORazepam  Antibiotics  :    Anti-infectives    None        Objective:   Filed Vitals:   09/15/15 1320 09/15/15 2149 09/16/15 0540 09/16/15 0954  BP: 116/62 119/54 127/55 120/70  Pulse: 72 75 75 95  Temp: 98.2 F (36.8 C) 98.6 F (37 C) 98.3 F (36.8 C) 98.6 F (37 C)  TempSrc: Oral Oral Oral Oral  Resp: 18 18 18 18   Height:      Weight:      SpO2: 98% 97% 97% 97%    Wt Readings from Last 3 Encounters:  09/13/15 67.132 kg (148 lb)  08/01/15 70.489 kg (155 lb 6.4 oz)  06/20/15 70.761 kg (156 lb)     Intake/Output Summary (Last 24 hours) at 09/16/15 1040 Last data filed at 09/16/15 0900  Gross per 24 hour  Intake   2617 ml  Output   5350 ml  Net  -2733 ml     Physical Exam  Awake Alert, Oriented X 3, No new F.N deficits, Right leg strength 4/5, Normal affect [place].AT,PERRAL Supple Neck,No JVD, No cervical lymphadenopathy appriciated.  Symmetrical Chest wall movement, Good air movement bilaterally, CTAB RRR,No Gallops,Rubs or new Murmurs, No Parasternal Heave +ve B.Sounds, Abd Soft, No tenderness, No organomegaly appriciated, No rebound - guarding or rigidity. No Cyanosis, Clubbing or edema, No new Rash or bruise     Data Review:   Micro Results No results found for this or any previous visit (from the past 240 hour(s)).  Radiology Reports Dg Lumbar Spine Complete  09/08/2015  CLINICAL DATA:  Low back pain radiating into the left leg with weakness and numbness, recent fall EXAM: LUMBAR SPINE - COMPLETE 4+ VIEW COMPARISON:  Coronal and sagittal reconstructed images through the lumbar spine from a CT scan  dated March 03, 2015. Chest x-ray of January 01, 2015 for purposes of vertebral level labeling. FINDINGS: The twelfth ribs are hypoplastic. L5 is transitional. The lumbar vertebral bodies are preserved in height. There is grade 1 anterolisthesis of L3 with respect L4 with mild disc space narrowing. There is moderate disc space narrowing at L4-5. There is no compression fracture. The pedicles and transverse processes appear intact. There is a pseudoarthrosis on the left at L5. IMPRESSION: There is degenerative disc space narrowing with grade 1 anterolisthesis of L3 with respect L4. There is degenerative disc space narrowing at L4-5. There is no compression fracture. If the patient's radicular symptoms persist, MRI would be a useful next imaging step. Electronically Signed   By: [name]  Martinique M.D.   On: 09/08/2015 13:11   Dg Abd 1 View  09/13/2015  CLINICAL DATA:  Weakness.  Rule out metallic stent. EXAM: ABDOMEN -  1 VIEW COMPARISON:  CT 03/03/2015 FINDINGS: Colonic stent noted 03/03/2015 is no longer visualized. Bowel sutures seen over the pelvis and central abdomen. Moderate stool volume without obstruction. IMPRESSION: No metallic stent over the colon. Electronically Signed   By: Monte Fantasia M.D.   On: 09/13/2015 01:15   Mr [name] [id number] Contrast  09/15/2015  CLINICAL DATA:  Metastatic brain tumor. S RS protocol. Metastatic colon cancer. EXAM: MRI HEAD WITHOUT AND WITH CONTRAST TECHNIQUE: Multiplanar, multiecho pulse sequences of the brain and surrounding structures were obtained without and with intravenous contrast. CONTRAST:  15 cc MultiHance COMPARISON:  09/13/2015 FINDINGS: Enhancing mass in the medial aspect at the left frontoparietal junction at the vertex measures 12 mm front to back, 10 mm cephalocaudal an 8 mm right to left. There is surrounding vasogenic edema. No second metastatic lesion is seen. There is a 7 mm meningioma at the left posterior frontal vertex. No evidence of ischemic infarction. No  hydrocephalus or extra-axial collection. No skull or skullbase lesion. IMPRESSION: 12 x 10 x 8 mm solitary brain metastasis in the medial aspect of the brain at the frontoparietal vertex. Moderate regional vasogenic edema. 7 mm meningioma at the lateral posterior frontal vertex. Electronically Signed   By: Nelson Chimes M.D.   On: 09/15/2015 17:29   Mr [name] [id number] Contrast  09/13/2015  CLINICAL DATA:  Right lower extremity weakness. History of metastatic colon cancer. EXAM: MRI HEAD WITHOUT AND WITH CONTRAST TECHNIQUE: Multiplanar, multiecho pulse sequences of the brain and surrounding structures were obtained without and with intravenous contrast. CONTRAST:  28mL MULTIHANCE GADOBENATE DIMEGLUMINE 529 MG/ML IV SOLN COMPARISON:  None. FINDINGS: The study is mildly motion degraded. There is no evidence of acute infarct, intracranial hemorrhage, midline shift, or extra-axial fluid collection. Ventricles and sulci are normal for age. No significant chronic white matter disease is seen. Within the high medial left frontoparietal region, there is a 1.7 x 1.6 x 1.4 cm intra-axial mass with predominantly peripheral enhancement which is thickest along its lateral margin. There is moderate surrounding vasogenic edema with local sulcal effacement. No other enhancing brain lesions are identified. Orbits are unremarkable. A tiny left maxillary sinus mucous retention cyst is noted. The mastoid air cells are clear. Major intracranial vascular flow voids are preserved. No suspicious skull lesions are identified. IMPRESSION: 1.7 cm left frontoparietal mass with moderate vasogenic edema, most consistent with solitary metastasis. No midline shift. Electronically Signed   By: Logan Bores M.D.   On: 09/13/2015 08:43   Mr Lumbar Spine Wo Contrast  09/13/2015  CLINICAL DATA:  Initial evaluation for worsening right leg numbness and weakness. EXAM: MRI LUMBAR SPINE WITHOUT CONTRAST TECHNIQUE: Multiplanar, multisequence MR imaging of  the lumbar spine was performed. No intravenous contrast was administered. COMPARISON:  Prior study from 09/08/2015. FINDINGS: Transitional lumbosacral anatomy present with partial sacralization of the L5 vertebral body. Mild levoscoliosis of the lumbar spine present. There is trace anterolisthesis of L3 on L4. Vertebral bodies otherwise normally aligned with preservation of the normal lumbar lordosis. Vertebral body heights well preserved. Signal intensity within the vertebral body bone marrow is normal. Benign hemangioma noted within the T12 vertebral body. No other focal osseous lesions. No marrow edema. Conus medullaris terminates normally at the L1 level. Signal intensity within the visualized cord is normal. Nerve roots of the cauda equina within normal limits. Small amount of presacral free fluid noted, as seen on sagittal STIR sequence (series 5, image 7). This is of uncertain significance. No other  acute soft tissue abnormality. Small T2 hyperintense cyst present within the right kidney. Gallbladder within normal limits. No pathologically enlarged retroperitoneal lymph nodes identified. Shotty subcentimeter left periaortic nodes noted. L1-2: Minimal annular disc bulge without focal disc herniation. No stenosis. L2-3: Mild diffuse disc bulge with disc desiccation. No focal disc herniation. Small annular fissure posteriorly. Mild facet and ligamentum flavum hypertrophy. No significant canal stenosis. Foramina are widely patent. L3-4: Trace anterolisthesis of L3 on L4. Mild diffuse disc bulge with disc desiccation. Associated annular fissure present posteriorly. Right greater than left facet arthrosis. 12 mm synovial cyst at the posterior aspect of the right L3-4 facet. There is mild to moderate canal and lateral recess stenosis at this level. Superimposed right foraminal disc protrusion (series 7, image 20). Protruding disc closely approximates the exiting right L3 nerve root in the right neural foramen  (series 4, image 5). This could potentially resultant right lower extremity radicular symptoms. Moderate right foraminal stenosis. No significant left foraminal narrowing. L4-5: Degenerative intervertebral disc space narrowing with endplate changes. Diffuse disc bulge without focal disc herniation. Facet and ligamentum flavum hypertrophy. Mild canal and lateral recess stenosis bilaterally. No significant foraminal narrowing. L5-S1: Transitional lumbosacral anatomy with rudimentary L5-S1 disc. No stenosis. IMPRESSION: 1. Trace anterolisthesis of L3 on L4 with associated disc bulge and superimposed shallow right foraminal disc protrusion. The protruding disc closely approximates the exiting right L3 nerve root, and could potentially result in right lower extremity radicular symptoms. Mild to moderate canal and moderate right foraminal stenosis at this level. 2. Degenerative disc bulge and facet disease at L4-5 with resultant mild canal stenosis. 3. Mild degenerative disc bulge at L2-3 without stenosis or neural impingement. 4. Transitional lumbosacral anatomy with sacralization of the L5 vertebral body. Careful correlation with numbering system on this study as well as plain film radiographs recommended prior to any potential future surgical intervention. 5. Small amount of presacral free fluid/edema, of uncertain significance. Electronically Signed   By: Jeannine Boga M.D.   On: 09/13/2015 04:58   Mr Cervical Spine W Wo Contrast  09/13/2015  CLINICAL DATA:  Worsening right leg numbness weakness. History of metastatic colon cancer. No acute injury or prior relevant surgery. EXAM: MRI CERVICAL SPINE WITHOUT AND WITH CONTRAST; MRI THORACIC SPINE WITHOUT AND WITH CONTRAST TECHNIQUE: Multiplanar and multiecho pulse sequences of the cervical spine, to include the craniocervical junction and cervicothoracic junction, were obtained according to standard protocol without and with intravenous contrast.; Multiplanar  and multiecho pulse sequences of the thoracic spine were obtained without and with intravenous contrast. CONTRAST:  3mL MULTIHANCE GADOBENATE DIMEGLUMINE 529 MG/ML IV SOLN COMPARISON:  Lumbar MRI same date.  Chest CT 03/03/2015. FINDINGS: Scan quality is mildly degraded by motion, especially on the post-contrast images. CERVICAL SPINE FINDINGS: Alignment: There is 4 mm of anterolisthesis at C7-T1. There is also a minimal anterolisthesis at C6-7. Bones: No acute or suspicious osseous findings. There are facet degenerative changes throughout the cervical spine and mild endplate degenerative changes. Cord: Normal in signal and caliber. No abnormal intradural enhancement. Posterior Fossa: Visualized portions unremarkable. Vertebral Arteries: Bilateral vertebral artery flow voids. Paraspinal tissues: Unremarkable. Disc levels: No significant disc space findings at or above C3-4. C4-5: Loss of disc height with mild uncinate spurring and facet hypertrophy. No cord deformity or foraminal compromise. C5-6: Loss of disc height with posterior osteophytes, uncinate spurring and facet hypertrophy. There is asymmetric mild to moderate right foraminal narrowing. No cord deformity. C6-7: Mild disc bulging, uncinate spurring and facet hypertrophy.  No cord deformity or significant foraminal compromise. C7-T1: Advanced facet hypertrophy accounts for the grade 1 anterolisthesis. There is mild disc bulging contributing to mild foraminal narrowing bilaterally. No cord deformity. THORACIC SPINE FINDINGS: Alignment: Mild scoliosis. Anterolisthesis at C7-T1 as described above. Otherwise normal. Bones: No acute or suspicious osseous findings. No evidence of osseous metastatic disease. T12 hemangioma noted. Cord:  The thoracic cord appears normal in signal and caliber. Paraspinal and other soft tissues: No significant paraspinal abnormalities. Multiple hepatic lesions are partially imaged, corresponding with known hemangiomas and metastatic  disease on prior CT. Disc levels: No significant thoracic disc space findings. Specifically, no evidence of disc herniation, spinal stenosis or nerve root encroachment. IMPRESSION: 1. No evidence of metastatic disease within the cervical thoracic spine. 2. Prominent anterolisthesis at C7-T1 due to facet disease. No resulting cord deformity or high-grade foraminal narrowing. 3. Mild additional cervical spondylosis without high-grade spinal stenosis or nerve root encroachment. No significant thoracic disc space findings. 4. Limited visualization of known hepatic lesions. Electronically Signed   By: Richardean Sale M.D.   On: 09/13/2015 08:53   Mr Thoracic Spine W Wo Contrast  09/13/2015  CLINICAL DATA:  Worsening right leg numbness weakness. History of metastatic colon cancer. No acute injury or prior relevant surgery. EXAM: MRI CERVICAL SPINE WITHOUT AND WITH CONTRAST; MRI THORACIC SPINE WITHOUT AND WITH CONTRAST TECHNIQUE: Multiplanar and multiecho pulse sequences of the cervical spine, to include the craniocervical junction and cervicothoracic junction, were obtained according to standard protocol without and with intravenous contrast.; Multiplanar and multiecho pulse sequences of the thoracic spine were obtained without and with intravenous contrast. CONTRAST:  50mL MULTIHANCE GADOBENATE DIMEGLUMINE 529 MG/ML IV SOLN COMPARISON:  Lumbar MRI same date.  Chest CT 03/03/2015. FINDINGS: Scan quality is mildly degraded by motion, especially on the post-contrast images. CERVICAL SPINE FINDINGS: Alignment: There is 4 mm of anterolisthesis at C7-T1. There is also a minimal anterolisthesis at C6-7. Bones: No acute or suspicious osseous findings. There are facet degenerative changes throughout the cervical spine and mild endplate degenerative changes. Cord: Normal in signal and caliber. No abnormal intradural enhancement. Posterior Fossa: Visualized portions unremarkable. Vertebral Arteries: Bilateral vertebral artery  flow voids. Paraspinal tissues: Unremarkable. Disc levels: No significant disc space findings at or above C3-4. C4-5: Loss of disc height with mild uncinate spurring and facet hypertrophy. No cord deformity or foraminal compromise. C5-6: Loss of disc height with posterior osteophytes, uncinate spurring and facet hypertrophy. There is asymmetric mild to moderate right foraminal narrowing. No cord deformity. C6-7: Mild disc bulging, uncinate spurring and facet hypertrophy. No cord deformity or significant foraminal compromise. C7-T1: Advanced facet hypertrophy accounts for the grade 1 anterolisthesis. There is mild disc bulging contributing to mild foraminal narrowing bilaterally. No cord deformity. THORACIC SPINE FINDINGS: Alignment: Mild scoliosis. Anterolisthesis at C7-T1 as described above. Otherwise normal. Bones: No acute or suspicious osseous findings. No evidence of osseous metastatic disease. T12 hemangioma noted. Cord:  The thoracic cord appears normal in signal and caliber. Paraspinal and other soft tissues: No significant paraspinal abnormalities. Multiple hepatic lesions are partially imaged, corresponding with known hemangiomas and metastatic disease on prior CT. Disc levels: No significant thoracic disc space findings. Specifically, no evidence of disc herniation, spinal stenosis or nerve root encroachment. IMPRESSION: 1. No evidence of metastatic disease within the cervical thoracic spine. 2. Prominent anterolisthesis at C7-T1 due to facet disease. No resulting cord deformity or high-grade foraminal narrowing. 3. Mild additional cervical spondylosis without high-grade spinal stenosis or nerve root encroachment.  No significant thoracic disc space findings. 4. Limited visualization of known hepatic lesions. Electronically Signed   By: Richardean Sale M.D.   On: 09/13/2015 08:53   Dg Foot Complete Right  09/12/2015  CLINICAL DATA:  Right foot numbness. Recent falls. Numbness in the right lower  extremity. EXAM: RIGHT FOOT COMPLETE - 3+ VIEW COMPARISON:  None. FINDINGS: There is no evidence of fracture or dislocation. There is no evidence of arthropathy or other focal bone abnormality. Soft tissues are unremarkable. Prominent calcification and spurring is noted at the attachment of the Achilles tendon. IMPRESSION: 1. No acute abnormality. 2. Prominent calcification and spurring at the attachment of the Achilles tendon. Electronically Signed   By: San Morelle M.D.   On: 09/12/2015 15:15     CBC  Recent Labs Lab 09/13/15 0604  WBC 6.0  HGB 10.8*  HCT 33.2*  PLT 360  MCV 83.0  MCH 27.0  MCHC 32.5  RDW 14.4  LYMPHSABS 1.7  MONOABS 0.8  EOSABS 0.2  BASOSABS 0.0    Chemistries   Recent Labs Lab 09/13/15 0604 09/14/15 0406 09/14/15 0812 09/15/15 0637 09/16/15 0455  NA 142  --  140 140 141  K 4.2  --  4.2 4.1 4.2  CL 108  --  110 112* 109  CO2 26  --  19* 20* 23  GLUCOSE 102*  --  147* 134* 125*  BUN 10  --  10 13 14   CREATININE 0.78  --  0.65 0.60 0.49  CALCIUM 11.5* 11.5* 11.1* 10.6* 11.4*  AST  --   --  23 19 27   ALT  --   --  15 15 22   ALKPHOS  --   --  50 49 53  BILITOT  --   --  0.2* 0.4 0.5   ------------------------------------------------------------------------------------------------------------------ No results for input(s): CHOL, HDL, LDLCALC, TRIG, CHOLHDL, LDLDIRECT in the last 72 hours.  Lab Results  Component Value Date   HGBA1C 5.9* 03/03/2015   ------------------------------------------------------------------------------------------------------------------  Recent Labs  09/13/15 1202  TSH 0.844   ------------------------------------------------------------------------------------------------------------------ No results for input(s): VITAMINB12, FOLATE, FERRITIN, TIBC, IRON, RETICCTPCT in the last 72 hours.  Coagulation profile  Recent Labs Lab 09/13/15 0604  INR 1.72*    No results for input(s): DDIMER in the last 72  hours.  Cardiac Enzymes No results for input(s): CKMB, TROPONINI, MYOGLOBIN in the last 168 hours.  Invalid input(s): CK ------------------------------------------------------------------------------------------------------------------ No results found for: BNP  Time Spent in minutes  35   Lala Lund K M.D on 09/16/2015 at 10:40 AM  Between 7am to 7pm - Pager - [phone]  After 7pm go to [email or web address] - password [email or web address]  [facility] -  Office  [phone]

## 2015-09-16 NOTE — Progress Notes (Signed)
Palliative Medicine Team consult was received.  Chart reviewed and plan noted for [facility] of brain lesion later this week.  Dr. Burr Medico has seen patient and plan for restaging CT as OP to further assess need for palliative radiation.  Patient noted to prefer palliative approach to her care and has previously declined chemo.  I stopped by to see Ms. Roseman and she is currently off the floor (plan for CT simulation today noted in chart).  Will plan to f/u with her later today or tomorrow. If there are urgent needs or questions please call [phone]. Thank you for consulting out team to assist with this patients care.  [name], MD [place] Team [phone]

## 2015-09-16 NOTE — Clinical Social Work Note (Signed)
CSW met with patient and family at bedside regarding possible SNF placement. Patient states that she will return home if inpatient rehab is unable to accept her as a patient. If the patient goes home she would prefer [facility] services. RNCM made aware. CSW signing off at this time.   Liz Beach MSW, [place], [address], [phone]

## 2015-09-16 NOTE — Progress Notes (Signed)
I stopped by to meet with [name] [name] this evening.  She reports being worn out and noted that she would prefer to meet tomorrow if possible.  Mornings are better for her from strength standpoint.  Plan for f/u tomorrow AM.  [name], MD [place] Team [phone]

## 2015-09-16 NOTE — Progress Notes (Signed)
Verbal report off to Baxter Flattery, Therapist, sports and informed her that Ms. Lofthouse will need to return on 09/18/15 for her 4pm treatment.  Explained that she received her CT simulation planning session today only.

## 2015-09-16 NOTE — Progress Notes (Signed)
Re-established IV with a 20 gauge angiocath in right wrist region due to inability to flush nor receive any blood return at the prior site in her left forearm with noted swelling above the site.  Brisk blood return and ease of flushing.

## 2015-09-16 NOTE — Progress Notes (Signed)
Called [address] [place] and spoke with Baxter Flattery, Therapist, sports.  She reports that Ms. Feast is alert and oriented, and has no pain presently.  Baxter Flattery is administering the 10 am medications prior to transport by [facility], who are present on the floor at this time. No contrast allergies are reported in her medical record.   IV access present.

## 2015-09-17 ENCOUNTER — Inpatient Hospital Stay (HOSPITAL_COMMUNITY)
Admission: RE | Admit: 2015-09-17 | Discharge: 2015-10-01 | DRG: 056 | Disposition: A | Discharge status: Home Health | Payer: Medicaid Other | Source: Intra-hospital | Attending: Physical Medicine & Rehabilitation | Admitting: Physical Medicine & Rehabilitation

## 2015-09-17 DIAGNOSIS — C786 Secondary malignant neoplasm of retroperitoneum and peritoneum: Secondary | ICD-10-CM | POA: Diagnosis present

## 2015-09-17 DIAGNOSIS — Z87891 Personal history of nicotine dependence: Secondary | ICD-10-CM

## 2015-09-17 DIAGNOSIS — C787 Secondary malignant neoplasm of liver and intrahepatic bile duct: Secondary | ICD-10-CM | POA: Diagnosis present

## 2015-09-17 DIAGNOSIS — L03113 Cellulitis of right upper limb: Secondary | ICD-10-CM | POA: Diagnosis present

## 2015-09-17 DIAGNOSIS — Z86711 Personal history of pulmonary embolism: Secondary | ICD-10-CM

## 2015-09-17 DIAGNOSIS — I10 Essential (primary) hypertension: Secondary | ICD-10-CM | POA: Diagnosis present

## 2015-09-17 DIAGNOSIS — I2690 Septic pulmonary embolism without acute cor pulmonale: Secondary | ICD-10-CM | POA: Diagnosis present

## 2015-09-17 DIAGNOSIS — Z808 Family history of malignant neoplasm of other organs or systems: Secondary | ICD-10-CM | POA: Diagnosis not present

## 2015-09-17 DIAGNOSIS — Z803 Family history of malignant neoplasm of breast: Secondary | ICD-10-CM | POA: Diagnosis not present

## 2015-09-17 DIAGNOSIS — M4800 Spinal stenosis, site unspecified: Secondary | ICD-10-CM | POA: Diagnosis present

## 2015-09-17 DIAGNOSIS — C801 Malignant (primary) neoplasm, unspecified: Secondary | ICD-10-CM | POA: Diagnosis not present

## 2015-09-17 DIAGNOSIS — Z79899 Other long term (current) drug therapy: Secondary | ICD-10-CM

## 2015-09-17 DIAGNOSIS — Z510 Encounter for antineoplastic radiation therapy: Secondary | ICD-10-CM | POA: Diagnosis present

## 2015-09-17 DIAGNOSIS — R270 Ataxia, unspecified: Secondary | ICD-10-CM | POA: Diagnosis present

## 2015-09-17 DIAGNOSIS — E871 Hypo-osmolality and hyponatremia: Secondary | ICD-10-CM | POA: Diagnosis present

## 2015-09-17 DIAGNOSIS — Z90710 Acquired absence of both cervix and uterus: Secondary | ICD-10-CM | POA: Diagnosis not present

## 2015-09-17 DIAGNOSIS — Z885 Allergy status to narcotic agent status: Secondary | ICD-10-CM | POA: Diagnosis not present

## 2015-09-17 DIAGNOSIS — L02413 Cutaneous abscess of right upper limb: Secondary | ICD-10-CM | POA: Insufficient documentation

## 2015-09-17 DIAGNOSIS — D649 Anemia, unspecified: Secondary | ICD-10-CM | POA: Insufficient documentation

## 2015-09-17 DIAGNOSIS — B370 Candidal stomatitis: Secondary | ICD-10-CM | POA: Diagnosis present

## 2015-09-17 DIAGNOSIS — C7931 Secondary malignant neoplasm of brain: Secondary | ICD-10-CM | POA: Diagnosis present

## 2015-09-17 DIAGNOSIS — D72829 Elevated white blood cell count, unspecified: Secondary | ICD-10-CM

## 2015-09-17 DIAGNOSIS — Z800 Family history of malignant neoplasm of digestive organs: Secondary | ICD-10-CM | POA: Diagnosis not present

## 2015-09-17 DIAGNOSIS — R000 Tachycardia, unspecified: Secondary | ICD-10-CM

## 2015-09-17 DIAGNOSIS — Z9081 Acquired absence of spleen: Secondary | ICD-10-CM | POA: Diagnosis not present

## 2015-09-17 DIAGNOSIS — R269 Unspecified abnormalities of gait and mobility: Secondary | ICD-10-CM | POA: Diagnosis not present

## 2015-09-17 DIAGNOSIS — Z85038 Personal history of other malignant neoplasm of large intestine: Secondary | ICD-10-CM | POA: Diagnosis not present

## 2015-09-17 DIAGNOSIS — M4310 Spondylolisthesis, site unspecified: Secondary | ICD-10-CM | POA: Diagnosis present

## 2015-09-17 DIAGNOSIS — G8191 Hemiplegia, unspecified affecting right dominant side: Secondary | ICD-10-CM | POA: Diagnosis present

## 2015-09-17 DIAGNOSIS — G936 Cerebral edema: Secondary | ICD-10-CM | POA: Diagnosis present

## 2015-09-17 DIAGNOSIS — Z7901 Long term (current) use of anticoagulants: Secondary | ICD-10-CM | POA: Diagnosis not present

## 2015-09-17 DIAGNOSIS — R531 Weakness: Secondary | ICD-10-CM | POA: Diagnosis present

## 2015-09-17 DIAGNOSIS — G8190 Hemiplegia, unspecified affecting unspecified side: Secondary | ICD-10-CM | POA: Diagnosis not present

## 2015-09-17 DIAGNOSIS — K120 Recurrent oral aphthae: Secondary | ICD-10-CM | POA: Diagnosis present

## 2015-09-17 DIAGNOSIS — D638 Anemia in other chronic diseases classified elsewhere: Secondary | ICD-10-CM

## 2015-09-17 LAB — COMPREHENSIVE METABOLIC PANEL
ALT: 45 U/L (ref 14–54)
ANION GAP: 9 (ref 5–15)
AST: 40 U/L (ref 15–41)
Albumin: 3.3 g/dL — ABNORMAL LOW (ref 3.5–5.0)
Alkaline Phosphatase: 51 U/L (ref 38–126)
BILIRUBIN TOTAL: 0.3 mg/dL (ref 0.3–1.2)
BUN: 16 mg/dL (ref 6–20)
CHLORIDE: 106 mmol/L (ref 101–111)
CO2: 23 mmol/L (ref 22–32)
Calcium: 11.4 mg/dL — ABNORMAL HIGH (ref 8.9–10.3)
Creatinine, Ser: 0.61 mg/dL (ref 0.44–1.00)
GFR calc Af Amer: >60 mL/min (ref >60)
Glucose, Bld: 129 mg/dL — ABNORMAL HIGH (ref 65–99)
POTASSIUM: 4.4 mmol/L (ref 3.5–5.1)
Sodium: 138 mmol/L (ref 135–145)
TOTAL PROTEIN: 7 g/dL (ref 6.5–8.1)

## 2015-09-17 LAB — CALCIUM, IONIZED: CALCIUM, IONIZED, SERUM: 6.6 mg/dL — AB (ref 4.5–5.6)

## 2015-09-17 MED ORDER — ACETAMINOPHEN 325 MG PO TABS
325.0000 mg | ORAL_TABLET | ORAL | Status: DC | PRN
  Administered 2015-09-17 – 2015-09-30 (×27): 650 mg via ORAL
  Filled 2015-09-17 (×28): qty 2

## 2015-09-17 MED ORDER — GABAPENTIN 300 MG PO CAPS
300.0000 mg | ORAL_CAPSULE | Freq: Three times a day (TID) | ORAL | Status: DC
  Administered 2015-09-17 – 2015-09-23 (×18): 300 mg via ORAL
  Filled 2015-09-17 (×18): qty 1

## 2015-09-17 MED ORDER — PANTOPRAZOLE SODIUM 40 MG PO TBEC
40.0000 mg | DELAYED_RELEASE_TABLET | Freq: Every day | ORAL | Status: DC
  Administered 2015-09-18 – 2015-09-30 (×13): 40 mg via ORAL
  Filled 2015-09-17 (×14): qty 1

## 2015-09-17 MED ORDER — RIVAROXABAN 20 MG PO TABS
20.0000 mg | ORAL_TABLET | Freq: Every day | ORAL | Status: DC
  Administered 2015-09-18 – 2015-09-30 (×13): 20 mg via ORAL
  Filled 2015-09-17 (×13): qty 1

## 2015-09-17 MED ORDER — DEXAMETHASONE 4 MG PO TABS
10.0000 mg | ORAL_TABLET | Freq: Two times a day (BID) | ORAL | Status: DC
  Administered 2015-09-17 – 2015-10-01 (×28): 10 mg via ORAL
  Filled 2015-09-17 (×28): qty 1

## 2015-09-17 MED ORDER — DEXAMETHASONE 6 MG PO TABS: 10.0000 mg | ORAL_TABLET | Freq: Two times a day (BID) | ORAL | Status: DC

## 2015-09-17 MED ORDER — BACLOFEN 10 MG PO TABS: 5.0000 mg | ORAL_TABLET | Freq: Three times a day (TID) | ORAL | Status: DC

## 2015-09-17 MED ORDER — ONDANSETRON HCL 4 MG/2ML IJ SOLN: 4.0000 mg | Freq: Four times a day (QID) | INTRAMUSCULAR | Status: DC | PRN

## 2015-09-17 MED ORDER — ONDANSETRON HCL 4 MG PO TABS: 4.0000 mg | ORAL_TABLET | Freq: Four times a day (QID) | ORAL | Status: DC | PRN

## 2015-09-17 MED ORDER — VITAMIN B-12 100 MCG PO TABS
100.0000 ug | ORAL_TABLET | Freq: Every day | ORAL | Status: DC
  Administered 2015-09-18 – 2015-10-01 (×14): 100 ug via ORAL
  Filled 2015-09-17 (×14): qty 1

## 2015-09-17 MED ORDER — SORBITOL 70 % SOLN: 30.0000 mL | Freq: Every day | Status: DC | PRN

## 2015-09-17 MED ORDER — BACLOFEN 5 MG HALF TABLET
5.0000 mg | ORAL_TABLET | Freq: Three times a day (TID) | ORAL | Status: DC
  Administered 2015-09-17 – 2015-09-23 (×19): 5 mg via ORAL
  Filled 2015-09-17 (×20): qty 1

## 2015-09-17 NOTE — Progress Notes (Signed)
Pt ready for d/c to inpt rehab today. Pt in agreement. I will make the arrangements. RN CM is aware. [id number]

## 2015-09-17 NOTE — Interval H&P Note (Signed)
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  The patient's history has been reviewed, patient examined, and there is no change in status.  Patient continues to be appropriate for intensive inpatient rehabilitation.  I have reviewed the patient's chart and labs.  Questions were answered to the patient's satisfaction. The PAPE has been reviewed and assessment remains appropriate.  Ankit Lorie Phenix 09/17/2015, 10:32 PM

## 2015-09-17 NOTE — Progress Notes (Signed)
I met with pt at bedside to discuss her rehab venue options. She prefers an inpt rehab admission then to d/c home with her husband. She does not want SNF and states she has discussed this with her family. She can arrange for supervision at home when her spouse works. She awaits palliative team consultation today and her radiation is planned for 4 pm tomorrow. I propose admission to inpt rehab tomorrow after her radiation if felt to be medically ready. I will follow up in the morning. Pt is in agreement. [phone]

## 2015-09-17 NOTE — Progress Notes (Signed)
Physical Medicine and Rehabilitation Consult Reason for Consult: RLE and RUE weakness due to left brain metastatic mass Referring Physician: Dr. Candiss Norse.    HPI: [name] [name] is a 65 y.o. female with history of stage IV colon cancer with mets to liver and peritoneum--declined chemo due to concerns of SE, anemia, PE-on Xarelto who was admitted on 09/13/15 with complaints of RUE ataxia, RLE weakness with difficulty walking/falls and RLE paraesthesias. MRI brain done revealing 1.7 cm left frontoparietal mass with moderate vasogenic edema consistent with solitary metastasis. MRI lumbar spine with L3 on L4 anterolisthesis with mild to moderate canal and right foraminal stenosis with protrusion of disc close to L3 nerve root. MRI thoracic and cervical spine negative for metastatic disease. She was placed on steroids for edema management and SRS recommended by Dr. Saintclair Halsted due to concerns of mass located within or close to motor strip and risk of permanent RLE motor deficits. Hypercalcemia noted and she was started on IV lasix, fluids and calcitonin for treatment. Dr. Lisbeth Renshaw consulted and patient to undergo CT simulation tomorrow with [facility] to brain lesion on Thurs or Fri. Dr. Feng/Hem/Onc consulted and patient has elected on palliative radiation only. PT ongoing and patient continues to be limited by LLE instability and ataxia, LUE weakness. CIR recommended for follow up therapy.    Review of Systems  HENT: Negative for hearing loss.  Eyes: Negative for blurred vision and double vision.  Respiratory: Negative for cough and shortness of breath.  Cardiovascular: Negative for chest pain and palpitations.  Gastrointestinal: Positive for heartburn. Negative for nausea and constipation.  Genitourinary: Negative for dysuria and urgency.  Musculoskeletal: Positive for joint pain (left hip pain). Negative for myalgias and back pain.  Neurological: Positive for focal weakness. Negative for dizziness, tingling,  sensory change, speech change and headaches.  Psychiatric/Behavioral: Negative for depression. The patient is not nervous/anxious.  All other systems reviewed and are negative.  Past Medical History  Diagnosis Date  . Colon cancer ([place])     dx'd 2014  . Heart murmur     ? as a child   . Pulmonary embolism ([place]) 11/2014   . Anemia     hx of     Past Surgical History  Procedure Laterality Date  . Abdominal hysterectomy  2005  . Cesarean section  1976  . Flexible sigmoidoscopy N/A 05/25/2013    Procedure: FLEXIBLE SIGMOIDOSCOPY; Surgeon: [name], MD; Location: [facility]; Service: Endoscopy; Laterality: N/A; needs floro  . Colonic stent placement N/A 05/25/2013    Procedure: COLONIC STENT PLACEMENT; Surgeon: [name], MD; Location: [facility]; Service: Endoscopy; Laterality: N/A;  . Portacath placement Right 06/25/2013    Procedure: ULTRA SOUND GUIDED INSERTION PORT-A-CATH; Surgeon: [name], MD; Location: [facility]; Service: General; Laterality: Right;  . Flexible sigmoidoscopy N/A 08/19/2014    Procedure: FLEXIBLE SIGMOIDOSCOPY; Surgeon: Gatha Mayer, MD; Location: [facility]; Service: Endoscopy; Laterality: N/A;  . Colonic stent placement N/A 08/19/2014    Procedure: COLONIC STENT PLACEMENT; Surgeon: Gatha Mayer, MD; Location: [facility]; Service: Endoscopy; Laterality: N/A;  . Flexible sigmoidoscopy N/A 08/19/2014    Procedure: FLEXIBLE SIGMOIDOSCOPY; Surgeon: Gatha Mayer, MD; Location: [facility]; Service: Endoscopy; Laterality: N/A;  . Colonic stent placement N/A 08/19/2014    Procedure: COLONIC STENT PLACEMENT; Surgeon: Gatha Mayer, MD; Location: [facility]; Service: Endoscopy; Laterality: N/A;  . Colon resection N/A 10/28/2014    Procedure: Transverse loop colostomy; Surgeon: [name], MD;  Location:  [facility]; Service: General; Laterality: N/A;  . Colostomy revision N/A 01/03/2015    Procedure: REVISION OF TRANSVERSE LOOP COLOSTOMY WITH PARTIAL COLECTOMY; Surgeon: [name], MD; Location: [facility]; Service: General; Laterality: N/A;  . Laparoscopic low anterior resection  03/06/2015    03/06/2015  . Laparoscopic splenectomy  03/06/2015  . Colon resection N/A 03/06/2015    Procedure: LAPAROSCOPIC takedown loop colostomy ; Surgeon: Michael Boston, MD; Location: [facility]; Service: General; Laterality: N/A;  . Splenectomy, total N/A 03/06/2015    Procedure: SPLENECTOMY; Surgeon: Michael Boston, MD; Location: [facility]; Service: General; Laterality: N/A;  . Bowel resection  03/06/2015    Procedure: LOW ANTERIOR BOWEL RESECTION, rigid proctoscopy; Surgeon: Michael Boston, MD; Location: [facility]; Service: General;;  . Polypectomy  03/06/2015    Procedure: transverse POLYPECTOMY x 4; Surgeon: Michael Boston, MD; Location: [facility]; Service: General;;    Family History  Problem Relation Age of Onset  . Colon cancer Sister 64  . Diabetes Neg Hx   . Thyroid disease Neg Hx   . Breast cancer Maternal Aunt   . Prostate cancer Maternal Uncle   . Bone cancer Maternal Grandfather   . Prostate cancer Maternal Uncle     Social History: Married--used to work as an Engineer, production. Retired in 2104. Husband works here but she has a good support system that can assist after discharge. Independent and was using cane for 2 weeks prior to admission. She reports that she quit smoking about 44 years ago. Her smoking use included Cigarettes. She quit after 1 year of use. She has never used smokeless tobacco. She reports that she does not drink alcohol or use illicit drugs.   Allergies  Allergen Reactions  . Oxycontin [Oxycodone Hcl] Anaphylaxis, Hives and Itching  . Codeine Itching and Nausea And Vomiting    Medications  Prior to Admission  Medication Sig Dispense Refill  . acetaminophen (TYLENOL) 500 MG tablet Take 500 mg by mouth every 6 (six) hours as needed for moderate pain or headache.     . Cyanocobalamin (VITAMIN B 12 PO) Take 1 tablet by mouth every morning.    Marland Kitchen dextromethorphan (DELSYM) 30 MG/5ML liquid Take 10 mLs by mouth every 12 (twelve) hours as needed for cough.    . diphenhydrAMINE (BENADRYL) 25 MG tablet Take 25 mg by mouth every 6 (six) hours as needed (cough).    . gabapentin (NEURONTIN) 300 MG capsule Take 300 mg by mouth 3 (three) times daily.  2  . IRON PO Take 1 tablet by mouth every morning.    . polyethylene glycol powder (GLYCOLAX/MIRALAX) powder TAKE 34 G (2 DOSES) BY MOUTH DAILY AS NEEDED FOR MILD CONSTIPATION OR MODERATE CONSTIPATION 255 g 0  . rivaroxaban (XARELTO) 20 MG TABS tablet Take 1 tablet (20 mg total) by mouth daily with supper. 30 tablet 3  . lidocaine-prilocaine (EMLA) cream Apply 1 tsp to site one hour before use, cover with plastic wrap. 1 each PRN    Home: Home Living Family/patient expects to be discharged to:: Private residence Living Arrangements: Spouse/significant other Available Help at Discharge: Family, Available PRN/intermittently (husband works) Type of Home: [place]: One level [place]: Radio producer - single point  Functional History: Prior Function Level of Independence: Independent with assistive device(s) Comments: independent with cane prior to last 2 wks Functional Status:  Mobility: Bed Mobility Overal bed mobility: Needs Assistance Bed Mobility: Supine to Sit, Sit to Supine Supine to sit: Min assist Sit to supine: Min assist General bed  mobility comments: Min assist to help progress right leg to EOB and lift right leg back into bed. Extra time needed to complete tasks Transfers Overall transfer level: Needs assistance Equipment used: Rolling walker (2 wheeled) Transfers: Sit  to/from Stand, [employer] Transfers Sit to Stand: Min assist, +2 safety/equipment Stand pivot transfers: Min assist, +2 safety/equipment General transfer comment: Min assist two person for safety/line management. Pt needs verbal and tactile cues for safe hand placement. Knee immobilized donned in bed prior to mobilizing.  Ambulation/Gait Ambulation/Gait assistance: Mod assist, +2 safety/equipment Ambulation Distance (Feet): 25 Feet Assistive device: Rolling walker (2 wheeled) Gait Pattern/deviations: Step-to pattern, Decreased dorsiflexion - right, Decreased weight shift to right, Steppage, Scissoring, Ataxic General Gait Details: Pt with decreased right leg coordination. Still buckling despite KI use. Smaller steps and cues for terminal knee extension help some until fatigued. Manual assist to keep right hand on RW as well. Chair to follow for safety Gait velocity: decreased    ADL:    Cognition: Cognition Overall Cognitive Status: Impaired/Different from baseline Orientation Level: Oriented X4 Cognition Arousal/Alertness: Awake/alert Behavior During Therapy: Impulsive Overall Cognitive Status: Impaired/Different from baseline Area of Impairment: Safety/judgement, Awareness Current Attention Level: Sustained Safety/Judgement: Decreased awareness of safety, Decreased awareness of deficits Awareness: Emergent Problem Solving: Difficulty sequencing, Requires verbal cues, Requires tactile cues General Comments: right sided inattention, but better awareness. A bit impulsive and quick to move especially when going to stand and scooting EOB.    Blood pressure 127/55, pulse 75, temperature 98.3 F (36.8 C), temperature source Oral, resp. rate 18, height 5' 5.5" (1.664 m), weight 67.132 kg (148 lb), SpO2 97 %. Physical Exam  Nursing note and vitals reviewed. Constitutional: She is oriented to person, place, and time. She appears well-developed and well-nourished.  HENT:  Head:  Normocephalic and atraumatic.  Mouth/Throat: Oropharynx is clear and moist.  Eyes: Conjunctivae and EOM are normal. Pupils are equal, round, and reactive to light.  Neck: Normal range of motion. Neck supple.  Cardiovascular: Normal rate and regular rhythm.  No murmur heard. Respiratory: Effort normal and breath sounds normal. No stridor. No respiratory distress. She has no wheezes.  GI: Soft. Bowel sounds are normal. She exhibits no distension. There is no tenderness.  Musculoskeletal: She exhibits no edema or tenderness.  Neurological: She is alert and oriented to person, place, and time.  Speech clear.  Follows basic motor commands without difficulty.  Motor LUE/LLE 5/5. Has some right inattention  RUE: deltoids 3+/5, biceps 4/5, triceps 4/5, intrinsics 4+/5.  RLE: HF 4-/5, KE 2/5, PF/DF 0/5.  Sensation intact to light touch DTRs 3+ throughout  Skin: Skin is warm and dry.  Psychiatric: She has a normal mood and affect. Her behavior is normal. Judgment and thought content normal.     Lab Results Last 24 Hours    Results for orders placed or performed during the hospital encounter of 09/12/15 (from the past 24 hour(s))  Comprehensive metabolic panel Status: Abnormal   Collection Time: 09/16/15 4:55 AM  Result Value Ref Range   Sodium 141 135 - 145 mmol/L   Potassium 4.2 3.5 - 5.1 mmol/L   Chloride 109 101 - 111 mmol/L   CO2 23 22 - 32 mmol/L   Glucose, Bld 125 (H) 65 - 99 mg/dL   BUN 14 6 - 20 mg/dL   Creatinine, Ser 0.49 0.44 - 1.00 mg/dL   Calcium 11.4 (H) 8.9 - 10.3 mg/dL   Total Protein 7.1 6.5 - 8.1 g/dL  Albumin 3.3 (L) 3.5 - 5.0 g/dL   AST 27 15 - 41 U/L   ALT 22 14 - 54 U/L   Alkaline Phosphatase 53 38 - 126 U/L   Total Bilirubin 0.5 0.3 - 1.2 mg/dL   GFR calc non Af Amer >60 >60 mL/min   GFR calc Af Amer >60 >60 mL/min   Anion gap 9 5 - 15      Imaging Results (Last 48 hours)      Mr [name] Wo Contrast  09/15/2015 CLINICAL DATA: Metastatic brain tumor. S RS protocol. Metastatic colon cancer. EXAM: MRI HEAD WITHOUT AND WITH CONTRAST TECHNIQUE: Multiplanar, multiecho pulse sequences of the brain and surrounding structures were obtained without and with intravenous contrast. CONTRAST: 15 cc MultiHance COMPARISON: 09/13/2015 FINDINGS: Enhancing mass in the medial aspect at the left frontoparietal junction at the vertex measures 12 mm front to back, 10 mm cephalocaudal an 8 mm right to left. There is surrounding vasogenic edema. No second metastatic lesion is seen. There is a 7 mm meningioma at the left posterior frontal vertex. No evidence of ischemic infarction. No hydrocephalus or extra-axial collection. No skull or skullbase lesion. IMPRESSION: 12 x 10 x 8 mm solitary brain metastasis in the medial aspect of the brain at the frontoparietal vertex. Moderate regional vasogenic edema. 7 mm meningioma at the lateral posterior frontal vertex. Electronically Signed By: Nelson Chimes M.D. On: 09/15/2015 17:29     Assessment/Plan: Diagnosis: RLE and RUE weakness due to left brain metastatic mass Labs and images independently reviewed. Records reviewed and summated above.   1. Does the need for close, 24 hr/day medical supervision in concert with the patient's rehab needs make it unreasonable for this patient to be served in a less intensive setting? Potentially  2. Co-Morbidities requiring supervision/potential complications: stage IV colon cancer with mets (declined chemo due to concerns of SE), anemia (transfuse if necessary to ensure appropriate perfusion for increased activity tolerance), PE (cont med), ataxia, Tachycardia (monitor in accordance with pain and increasing activity) 3. Due to safety, skin/wound care, disease management and patient education, does the patient require 24 hr/day rehab nursing? Yes 4. Does the patient require coordinated care of a physician,  rehab nurse, PT (1-2 hrs/day, 5 days/week) and OT (1-2 hrs/day, 5 days/week) to address physical and functional deficits in the context of the above medical diagnosis(es)? Yes Addressing deficits in the following areas: balance, endurance, locomotion, strength, transferring, dressing, toileting and psychosocial support 5. Can the patient actively participate in an intensive therapy program of at least 3 hrs of therapy per day at least 5 days per week? Yes 6. The potential for patient to make measurable gains while on inpatient rehab is excellent 7. Anticipated functional outcomes upon discharge from inpatient rehab are supervision with PT, supervision with OT, n/a with SLP. 8. Estimated rehab length of stay to reach the above functional goals is: 14-17 days. 9. Does the patient have adequate social supports and living environment to accommodate these discharge functional goals? Yes 10. Anticipated D/C setting: Home 11. Anticipated post D/C treatments: HH therapy and Home excercise program 12. Overall Rehab/Functional Prognosis: good and fair  RECOMMENDATIONS: This patient's condition is appropriate for continued rehabilitative care in the following setting: CIR Patient has agreed to participate in recommended program. Yes Note that insurance prior authorization may be required for reimbursement for recommended care.  Comment: Rehab Admissions Coordinator to follow up.  Delice Lesch, MD 09/16/2015       Revision History  Date/Time User Provider Type Action   09/16/2015 2:54 PM Ankit Lorie Phenix, MD Physician Sign   09/16/2015 8:55 AM Bary Leriche, PA-C Physician Assistant Share   View Details Report       Routing History     Date/Time From To Method   09/16/2015 2:54 PM Ankit Lorie Phenix, MD Ankit Lorie Phenix, MD [facility]   09/16/2015 2:54 PM Ankit Lorie Phenix, MD [place] Fax

## 2015-09-17 NOTE — Progress Notes (Signed)
Patient being transferred to [facility] for ip rehab. Report called and given to Jennette, Therapist, sports. All belongings sent with patient.

## 2015-09-17 NOTE — Progress Notes (Signed)
Pt admitted to unit at 1825 with cousions at bedside. Reviewed safety plan and pt schedule with pt and daughter with verbal understanding. Pt resting in bed with daughter at bedside with call bell within reach.

## 2015-09-17 NOTE — Progress Notes (Signed)
PMR Admission Coordinator Pre-Admission Assessment  Patient: [name] [name] is an 65 y.o., female MRN: [id number] DOB: 06-Sep-1950 Height: 5' 5.5" (166.4 cm) Weight: 67.132 kg (148 lb)  Insurance Information HMO: PPO: PCP: IPA: 80/20: OTHER:  PRIMARY: Medicaid [place] Access Policy#: [id number] t Subscriber: pt  Pt states Medicare to start [id number]  Medicaid Application Date: Case Manager:  Disability Application Date: Case Worker:   Emergency Contact Information Contact Information    Name Relation Home Work Mobile   Flenniken,Bernard Spouse   [phone]   [facility] Daughter   [phone]   Astrid Drafts Daughter   [phone]     Current Medical History  Patient Admitting Diagnosis: left brain metastatic mass History of Present Illness: [name] [name] is a 65 y.o. female with history of stage IV colon cancer with mets to liver and peritoneum--declined chemo due to concerns of SE, anemia, PE-on Xarelto who was admitted on 09/13/15 with complaints of RUE ataxia, RLE weakness with difficulty walking/falls and RLE paraesthesias. MRI brain done revealing 1.7 cm left frontoparietal mass with moderate vasogenic edema consistent with solitary metastasis. MRI lumbar spine with L3 on L4 anterolisthesis with mild to moderate canal and right foraminal stenosis with protrusion of disc close to L3 nerve root. MRI thoracic and cervical spine negative for metastatic disease. She was placed on steroids for edema management and SRS recommended by Dr. Saintclair Halsted due to concerns of mass located within or close to motor strip and risk of permanent RLE motor deficits. Hypercalcemia noted and she was started on IV lasix, fluids and calcitonin for treatment. Dr. Lisbeth Renshaw consulted and patient to  undergo CT simulation tomorrow with [facility] to brain lesion on Thurs or Fri. Dr. Feng/Hem/Onc consulted and patient has elected on palliative radiation only. PT ongoing and patient continues to be limited by LLE instability and ataxia, LUE weakness. She has been using a knee immobilizer when walking on the right lower extremity to keep her knee from buckling.    Past Medical History  Past Medical History  Diagnosis Date  . Colon cancer ([place])     dx'd 2014  . Heart murmur     ? as a child   . Pulmonary embolism ([place]) 11/2014   . Anemia     hx of     Family History  family history includes Bone cancer in her maternal grandfather; Breast cancer in her maternal aunt; Colon cancer (age of onset: 39) in her sister; Prostate cancer in her maternal uncle and maternal uncle. There is no history of Diabetes or Thyroid disease.  Prior Rehab/Hospitalizations:  Has the patient had major surgery during 100 days prior to admission? No  Current Medications   Current facility-administered medications:  . acetaminophen (TYLENOL) tablet 500 mg, 500 mg, Oral, Q6H PRN, Orvan Falconer, MD, 500 mg at 09/17/15 0746 . baclofen (LIORESAL) tablet 5 mg, 5 mg, Oral, TID, Thurnell Lose, MD, 5 mg at 09/17/15 0857 . dexamethasone (DECADRON) injection 10 mg, 10 mg, Intravenous, Q12H, Thurnell Lose, MD, 10 mg at 09/17/15 0856 . gabapentin (NEURONTIN) capsule 300 mg, 300 mg, Oral, TID, Orvan Falconer, MD, 300 mg at 09/17/15 0856 . gadobenate dimeglumine (MULTIHANCE) injection 15 mL, 15 mL, Intravenous, Once PRN, Thurnell Lose, MD . iohexol (OMNIPAQUE) 300 MG/ML solution 25 mL, 25 mL, Oral, Once PRN, Thurnell Lose, MD . iohexol (OMNIPAQUE) 300 MG/ML solution 25 mL, 25 mL, Oral, Once PRN, Thurnell Lose, MD . LORazepam (ATIVAN) tablet 0.5 mg, 0.5 mg, Oral, PRN, Hayden Pedro,  PA-C, 0.5 mg at 09/15/15 1549 . pamidronate (AREDIA) 90 mg in sodium chloride 0.9 % 500 mL IVPB, 90 mg,  Intravenous, Once, Thurnell Lose, MD . pantoprazole (PROTONIX) EC tablet 40 mg, 40 mg, Oral, Q0600, Orvan Falconer, MD, 40 mg at 09/17/15 0856 . rivaroxaban (XARELTO) tablet 20 mg, 20 mg, Oral, Q supper, Orvan Falconer, MD, 20 mg at 09/16/15 1726 . vitamin B-12 (CYANOCOBALAMIN) tablet 100 mcg, 100 mcg, Oral, Daily, Orvan Falconer, MD, 100 mcg at 09/17/15 0857  Patients Current Diet: Diet regular Room service appropriate?: Yes; Fluid consistency:: Thin Diet - low sodium heart healthy  Precautions / Restrictions Precautions Precautions: Fall Precaution Comments: right sided weakness and dyscoordination Restrictions Weight Bearing Restrictions: No   Has the patient had 2 or more falls or a fall with injury in the past year?Yes 3 to 4 falls due to weakness just in past two weeks. Slid from bed, tripped on rug, etc. Pt states only 2 falls and with close misses.  Prior Activity Level Community (5-7x/wk): Mod I with cane and driving until 2 weeks pta. own adls, choir, Nurse, adult / [place] Devices/Equipment: Cane (specify quad or straight) (straight) Home Equipment: Cane - single point  Prior Device Use: Indicate devices/aids used by the patient prior to current illness, exacerbation or injury? Uses cane pta  Prior Functional Level Prior Function Level of Independence: Independent with assistive device(s) Comments: independent with cane prior to last 2 wks  Self Care: Did the patient need help bathing, dressing, using the toilet or eating? Independent  Indoor Mobility: Did the patient need assistance with walking from room to room (with or without device)? Independent  Stairs: Did the patient need assistance with internal or external stairs (with or without device)? Independent  Functional Cognition: Did the patient need help planning regular tasks such as shopping or remembering to take medications? Independent  Current Functional Level Cognition  Overall  Cognitive Status: Impaired/Different from baseline Current Attention Level: Sustained Orientation Level: Oriented X4 Safety/Judgement: Decreased awareness of safety, Decreased awareness of deficits General Comments: right sided inattention, but better awareness. A bit impulsive and quick to move especially when going to stand and scooting EOB.    Extremity Assessment (includes Sensation/Coordination)  Upper Extremity Assessment: RUE deficits/detail RUE Deficits / Details: Right inattention with UE/LE RUE Coordination: decreased fine motor, decreased gross motor  Lower Extremity Assessment: RLE deficits/detail RLE Deficits / Details: RLE weakness noted 3/5, unable to complete range against gravity, poor quad support RLE Sensation: decreased light touch, decreased proprioception RLE Coordination: decreased fine motor, decreased gross motor    ADLs       Mobility  Overal bed mobility: Needs Assistance Bed Mobility: Supine to Sit, Sit to Supine Supine to sit: Min assist Sit to supine: Min assist General bed mobility comments: Min assist to help progress right leg to EOB and lift right leg back into bed. Extra time needed to complete tasks    Transfers  Overall transfer level: Needs assistance Equipment used: Rolling walker (2 wheeled) Transfers: Sit to/from Stand, [employer] Transfers Sit to Stand: Min assist, +2 safety/equipment Stand pivot transfers: Min assist, +2 safety/equipment General transfer comment: Min assist two person for safety/line management. Pt needs verbal and tactile cues for safe hand placement. Knee immobilized donned in bed prior to mobilizing.     Ambulation / Gait / Stairs / Wheelchair Mobility  Ambulation/Gait Ambulation/Gait assistance: Mod assist, +2 safety/equipment Ambulation Distance (Feet): 25 Feet Assistive device: Rolling walker (  2 wheeled) Gait Pattern/deviations: Step-to pattern, Decreased dorsiflexion - right, Decreased  weight shift to right, Steppage, Scissoring, Ataxic General Gait Details: Pt with decreased right leg coordination. Still buckling despite KI use. Smaller steps and cues for terminal knee extension help some until fatigued. Manual assist to keep right hand on RW as well. Chair to follow for safety Gait velocity: decreased    Posture / Balance Balance Overall balance assessment: Needs assistance Sitting-balance support: Feet supported, No upper extremity supported Sitting balance-Leahy Scale: Good Standing balance support: Single extremity supported, Bilateral upper extremity supported Standing balance-Leahy Scale: Poor    Special needs/care consideration Skin intact Bowel mgmt: continent Bladder mgmt: continent On 3/15/ pt declined palliative consult. Plans to f/u with her own oncologist after d/c.  One dose radiation planned 4 pm 3/16 at [place]. I will schedule [facility] to pick up for appointment On 3/15 pt states her daughters are aware of frontal lesion requiring radiation and overall picture. Pt states her spouse is aware of tc 3/16 at [place], but not fully aware of lesion. He has upcoming mouth surgery at the [place] in [place] and she does not want to stress him.    Previous Home Environment Living Arrangements: Spouse/significant other Lives With: Spouse Available Help at Discharge: Family, Available 24 hours/day Type of Home: House Home Layout: One level Home Access: Level entry Bathroom Shower/Tub: Tub/shower unit, Walk-in shower (both are available) Biochemist, clinical: Standard Bathroom Accessibility: Yes How Accessible: Accessible via walker [place]: No  Discharge Living Setting Plans for Discharge Living Setting: Patient's home, Lives with (comment) (spouse) Type of Home at Discharge: House Discharge Home Layout: One level Discharge Home Access: Level entry Discharge Bathroom Shower/Tub: Tub/shower unit, Walk-in shower Discharge Bathroom Toilet:  Standard Discharge Bathroom Accessibility: Yes How Accessible: Accessible via walker Does the patient have any problems obtaining your medications?: No  Social/Family/Support Systems Patient Roles: Spouse, Parent Contact Information: [name], husband and two daughhters, MIchelle and Danae Chen Anticipated Caregiver: pt's Mom , daughters and family Anticipated Caregiver's Contact Information: see above Ability/Limitations of Caregiver: spouse works days Caregiver Availability: 24/7 Discharge Plan Discussed with Primary Caregiver: Yes Is Caregiver In Agreement with Plan?: Yes Does Caregiver/Family have Issues with Lodging/Transportation while Pt is in Rehab?: No  Spouse self employed Forensic scientist ( a lot foe [place]) and Theme park manager. They have been married for 20 years. Her two daughters fully aware of findings this admission, her spouse is not. I have encouraged her today 3/15 to discuss all finding with her spouse. Pt's 24 yo Mother can provide supervision level as well as friends and family at d/c when her spouse works.  Goals/Additional Needs Patient/Family Goal for Rehab: supervision with PT and OT Expected length of stay: ELOS 14 days Pt/Family Agrees to Admission and willing to participate: Yes Program Orientation Provided & Reviewed with Pt/Caregiver Including Roles & Responsibilities: Yes   Decrease burden of Care through IP rehab admission: n/a  Possible need for SNF placement upon discharge:not anticipated   Patient Condition: This patient's condition remains as documented in the consult dated 09/16/15, in which the Rehabilitation Physician determined and documented that the patient's condition is appropriate for intensive rehabilitative care in an inpatient rehabilitation facility. Will admit to inpatient rehab today.  Preadmission Screen Completed By: Cleatrice Burke, 09/17/2015 3:50 PM ______________________________________________________________________   Discussed status with Dr. Posey Pronto on 09/17/15 at 1350 and received telephone approval for admission today.  Admission Coordinator: Cleatrice Burke, time [id number] Date 09/17/15  Cosigned by: Ankit Lorie Phenix, MD at 09/17/2015 3:57 PM  Revision History     Date/Time User Provider Type Action   09/17/2015 3:57 PM Ankit Lorie Phenix, MD Physician Cosign   09/17/2015 3:50 PM Cristina Gong, RN Rehab Admission Coordinator Sign

## 2015-09-17 NOTE — H&P (View-Only) (Signed)
Physical Medicine and Rehabilitation Admission H&P    Chief Complaint  Patient presents with  . Fall  . Extremity Weakness  : HPI: [name] [name] is a 65 y.o. female with history of stage IV colon cancer with mets to liver and peritoneum--declined chemo due to concerns of SE, anemia, PE-on Xarelto who was admitted on 09/13/15 with complaints of RUE ataxia, RLE weakness with difficulty walking/falls and RLE paraesthesias. MRI brain done revealing 1.7 cm left frontoparietal mass with moderate vasogenic edema consistent with solitary metastasis. MRI lumbar spine with L3 on L4 anterolisthesis with mild to moderate canal and right foraminal stenosis with protrusion of disc close to L3 nerve root. MRI thoracic and cervical spine negative for metastatic disease. She was placed on steroids for edema management and SRS recommended by Dr. Saintclair Halsted due to concerns of mass located within or close to motor strip and risk of permanent RLE motor deficits. Hypercalcemia noted and she was started on IV lasix, fluids and calcitonin for treatment. Dr. Lisbeth Renshaw consulted and patient to undergo CT simulation tomorrow with [facility] to brain lesion on Thurs or Fri. Dr. Feng/Hem/Onc consulted and patient has elected on palliative radiation only. Palliative care consult good for goals of care. PT ongoing and patient continues to be limited by LLE instability and ataxia, LUE weakness. She has been using a knee immobilizer when walking on the right lower extremity to keep her knee from buckling. CIR recommended for follow up therapy.  ROS HENT: Negative for hearing loss.  Eyes: Negative for blurred vision and double vision.  Respiratory: Negative for cough and shortness of breath.  Cardiovascular: Negative for chest pain and palpitations.  Gastrointestinal: Positive for heartburn. Negative for nausea and constipation.  Genitourinary: Negative for dysuria and urgency.  Musculoskeletal: Positive for joint pain (left hip pain).  Negative for myalgias and back pain.  Neurological: Positive for focal weakness. Negative for dizziness, tingling, sensory change, speech change and headaches.  Psychiatric/Behavioral: Negative for depression. The patient is not nervous/anxious.  All other systems reviewed and are negative   Past Medical History  Diagnosis Date  . Colon cancer ([place])     dx'd 2014  . Heart murmur     ? as a child   . Pulmonary embolism ([place]) 11/2014   . Anemia     hx of    Past Surgical History  Procedure Laterality Date  . Abdominal hysterectomy  2005  . Cesarean section  1976  . Flexible sigmoidoscopy N/A 05/25/2013    Procedure: FLEXIBLE SIGMOIDOSCOPY;  Surgeon: [name], MD;  Location: [facility];  Service: Endoscopy;  Laterality: N/A;  needs floro  . Colonic stent placement N/A 05/25/2013    Procedure: COLONIC STENT PLACEMENT;  Surgeon: [name], MD;  Location: [facility];  Service: Endoscopy;  Laterality: N/A;  . Portacath placement Right 06/25/2013    Procedure: ULTRA SOUND GUIDED INSERTION PORT-A-CATH;  Surgeon: [name], MD;  Location: [place];  Service: General;  Laterality: Right;  . Flexible sigmoidoscopy N/A 08/19/2014    Procedure: FLEXIBLE SIGMOIDOSCOPY;  Surgeon: Gatha Mayer, MD;  Location: [facility];  Service: Endoscopy;  Laterality: N/A;  . Colonic stent placement N/A 08/19/2014    Procedure: COLONIC STENT PLACEMENT;  Surgeon: Gatha Mayer, MD;  Location: [facility];  Service: Endoscopy;  Laterality: N/A;  . Flexible sigmoidoscopy N/A 08/19/2014    Procedure: FLEXIBLE SIGMOIDOSCOPY;  Surgeon: Gatha Mayer, MD;  [facility]: [facility];  Service: Endoscopy;  Laterality: N/A;  . Colonic stent placement N/A 08/19/2014    Procedure: COLONIC STENT PLACEMENT;  Surgeon: Gatha Mayer, MD;  Location: [facility];  Service: Endoscopy;  Laterality: N/A;  . Colon resection N/A 10/28/2014    Procedure: Transverse loop colostomy;  Surgeon:  [name], MD;  Location: [facility];  Service: General;  Laterality: N/A;  . Colostomy revision N/A 01/03/2015    Procedure: REVISION OF TRANSVERSE LOOP COLOSTOMY WITH PARTIAL  COLECTOMY;  Surgeon: [name], MD;  Location: [facility];  Service: General;  Laterality: N/A;  . Laparoscopic low anterior resection  03/06/2015    03/06/2015  . Laparoscopic splenectomy  03/06/2015  . Colon resection N/A 03/06/2015    Procedure: LAPAROSCOPIC takedown loop colostomy ;  Surgeon: Michael Boston, MD;  Location: [facility];  Service: General;  Laterality: N/A;  . 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 Bowel resection  03/06/2015    Procedure: LOW ANTERIOR BOWEL RESECTION, rigid proctoscopy;  Surgeon: Michael Boston, MD;  Location: [facility];  Service: General;;  . Polypectomy  03/06/2015    Procedure: transverse POLYPECTOMY x 4;  Surgeon: Michael Boston, MD;  Location: [facility];  Service: General;;   Family History  Problem Relation Age of Onset  . Colon cancer Sister 48  . Diabetes Neg Hx   . Thyroid disease Neg Hx   . Breast cancer Maternal Aunt   . Prostate cancer Maternal Uncle   . Bone cancer Maternal Grandfather   . Prostate cancer Maternal Uncle    Social History:  reports that she quit smoking about 44 years ago. Her smoking use included Cigarettes. She quit after 1 year of use. She has never used smokeless tobacco. She reports that she does not drink alcohol or use illicit drugs. Allergies:  Allergies  Allergen Reactions  . Oxycontin [Oxycodone Hcl] Anaphylaxis, Hives and Itching  . Codeine Itching and Nausea And Vomiting   Medications Prior to Admission  Medication Sig Dispense Refill  . acetaminophen (TYLENOL) 500 MG tablet Take 500 mg by mouth every 6 (six) hours as needed for moderate pain or headache.     . Cyanocobalamin (VITAMIN B 12 PO) Take 1 tablet by mouth every morning.    Marland Kitchen  dextromethorphan (DELSYM) 30 MG/5ML liquid Take 10 mLs by mouth every 12 (twelve) hours as needed for cough.    . diphenhydrAMINE (BENADRYL) 25 MG tablet Take 25 mg by mouth every 6 (six) hours as needed (cough).    . gabapentin (NEURONTIN) 300 MG capsule Take 300 mg by mouth 3 (three) times daily.  2  . IRON PO Take 1 tablet by mouth every morning.    . polyethylene glycol powder (GLYCOLAX/MIRALAX) powder TAKE 34 G (2 DOSES) BY MOUTH DAILY AS NEEDED FOR MILD CONSTIPATION OR MODERATE CONSTIPATION 255 g 0  . rivaroxaban (XARELTO) 20 MG TABS tablet Take 1 tablet (20 mg total) by mouth daily with supper. 30 tablet 3  . lidocaine-prilocaine (EMLA) cream Apply 1 tsp to site one hour before use, cover with plastic wrap. 1 each PRN    Home: Home Living Family/patient expects to be discharged to:: Private residence Living Arrangements: Spouse/significant other Available Help at Discharge: Family, Available PRN/intermittently (husband works) Type of Home: House Home Layout: One level Home Equipment: [employer] - single point   Functional History: Prior Function Level of Independence: Independent with assistive device(s) Comments: independent with  cane prior to last 2 wks  Functional Status:  Mobility: Bed Mobility Overal bed mobility: Needs Assistance Bed Mobility: Supine to Sit, Sit to Supine Supine to sit: Min assist Sit to supine: Min assist General bed mobility comments: Min assist to help progress right leg to EOB and lift right leg back into bed.  Extra time needed to complete tasks Transfers Overall transfer level: Needs assistance Equipment used: Rolling walker (2 wheeled) Transfers: Sit to/from Stand, [employer] Transfers Sit to Stand: Min assist, +2 safety/equipment Stand pivot transfers: Min assist, +2 safety/equipment General transfer comment: Min assist two person for safety/line management.  Pt needs verbal and tactile cues for safe hand placement. Knee immobilized donned in bed  prior to mobilizing.   Ambulation/Gait Ambulation/Gait assistance: Mod assist, +2 safety/equipment Ambulation Distance (Feet): 25 Feet Assistive device: Rolling walker (2 wheeled) Gait Pattern/deviations: Step-to pattern, Decreased dorsiflexion - right, Decreased weight shift to right, Steppage, Scissoring, Ataxic General Gait Details: Pt with decreased right leg coordination. Still buckling despite KI use.  Smaller steps and cues for terminal knee extension help some until fatigued.  Manual assist to keep right hand on RW as well. Chair to follow for safety Gait velocity: decreased    ADL:    Cognition: Cognition Overall Cognitive Status: Impaired/Different from baseline Orientation Level: Oriented X4 Cognition Arousal/Alertness: Awake/alert Behavior During Therapy: Impulsive Overall Cognitive Status: Impaired/Different from baseline Area of Impairment: Safety/judgement, Awareness Current Attention Level: Sustained Safety/Judgement: Decreased awareness of safety, Decreased awareness of deficits Awareness: Emergent Problem Solving: Difficulty sequencing, Requires verbal cues, Requires tactile cues General Comments: right sided inattention, but better awareness.  A bit impulsive and quick to move especially when going to stand and scooting EOB.   Physical Exam: Blood pressure 118/54, pulse 81, temperature 98.3 F (36.8 C), temperature source Oral, resp. rate 16, height 5' 5.5" (1.664 m), weight 67.132 kg (148 lb), SpO2 100 %. Physical Exam Constitutional: She is oriented to person, place, and time. She appears well-developed and well-nourished.  HENT:  Head: Normocephalic and atraumatic.  Mouth/Throat: Oropharynx is clear and moist.  Eyes: Conjunctivae and EOM are normal. Pupils are equal, round, and reactive to light.  Neck: Normal range of motion. Neck supple.  Cardiovascular: Normal rate and regular rhythm.  No murmur heard. Respiratory: Effort normal and breath sounds  normal. No stridor. No respiratory distress. She has no wheezes.  GI: Soft. Bowel sounds are normal. She exhibits no distension. There is no tenderness.  Musculoskeletal: She exhibits no edema or tenderness.  Neurological: She is alert and oriented to person, place, and time.  Speech clear.  Follows basic motor commands without difficulty.  Motor LUE/LLE 5/5. Has some right inattention  RUE: deltoids 3+/5, biceps 4/5, triceps 4/5, intrinsics 4+/5.  RLE: HF 4-/5, KE 2/5, PF/DF 0/5.  Sensation intact to light touch DTRs 3+ throughout  Skin: Skin is warm and dry.  Psychiatric: She has a normal mood and affect. Her behavior is normal. Judgment and thought content normal   Results for orders placed or performed during the hospital encounter of 09/12/15 (from the past 48 hour(s))  Comprehensive metabolic panel     Status: Abnormal   Collection Time: 09/16/15  4:55 AM  Result Value Ref Range   Sodium 141 135 - 145 mmol/L   Potassium 4.2 3.5 - 5.1 mmol/L   Chloride 109 101 - 111 mmol/L   CO2 23 22 - 32 mmol/L   Glucose, Bld 125 (H) 65 - 99 mg/dL   BUN  14 6 - 20 mg/dL   Creatinine, Ser 0.49 0.44 - 1.00 mg/dL   Calcium 11.4 (H) 8.9 - 10.3 mg/dL   Total Protein 7.1 6.5 - 8.1 g/dL   Albumin 3.3 (L) 3.5 - 5.0 g/dL   AST 27 15 - 41 U/L   ALT 22 14 - 54 U/L   Alkaline Phosphatase 53 38 - 126 U/L   Total Bilirubin 0.5 0.3 - 1.2 mg/dL   GFR calc non Af Amer >60 >60 mL/min   GFR calc Af Amer >60 >60 mL/min    Comment: (NOTE) The eGFR has been calculated using the CKD EPI equation. This calculation has not been validated in all clinical situations. eGFR's persistently <60 mL/min signify possible Chronic Kidney Disease.    Anion gap 9 5 - 15  Calcium, ionized     Status: Abnormal   Collection Time: 09/16/15  4:55 AM  Result Value Ref Range   Calcium, Ionized, Serum 6.6 (H) 4.5 - 5.6 mg/dL    Comment: (NOTE) **Results verified by repeat testing** Performed At: [facility] [place], [place] [id number] Lindon Romp MD NW:[id number]   Comprehensive metabolic panel     Status: Abnormal   Collection Time: 09/17/15  4:45 AM  Result Value Ref Range   Sodium 138 135 - 145 mmol/L   Potassium 4.4 3.5 - 5.1 mmol/L   Chloride 106 101 - 111 mmol/L   CO2 23 22 - 32 mmol/L   Glucose, Bld 129 (H) 65 - 99 mg/dL   BUN 16 6 - 20 mg/dL   Creatinine, Ser 0.61 0.44 - 1.00 mg/dL   Calcium 11.4 (H) 8.9 - 10.3 mg/dL   Total Protein 7.0 6.5 - 8.1 g/dL   Albumin 3.3 (L) 3.5 - 5.0 g/dL   AST 40 15 - 41 U/L   ALT 45 14 - 54 U/L   Alkaline Phosphatase 51 38 - 126 U/L   Total Bilirubin 0.3 0.3 - 1.2 mg/dL   GFR calc non Af Amer >60 >60 mL/min   GFR calc Af Amer >60 >60 mL/min    Comment: (NOTE) The eGFR has been calculated using the CKD EPI equation. This calculation has not been validated in all clinical situations. eGFR's persistently <60 mL/min signify possible Chronic Kidney Disease.    Anion gap 9 5 - 15   Mr Kizzie Fantasia Contrast  09/15/2015  CLINICAL DATA:  Metastatic brain tumor. S RS protocol. Metastatic colon cancer. EXAM: MRI HEAD WITHOUT AND WITH CONTRAST TECHNIQUE: Multiplanar, multiecho pulse sequences of the brain and surrounding structures were obtained without and with intravenous contrast. CONTRAST:  15 cc MultiHance COMPARISON:  09/13/2015 FINDINGS: Enhancing mass in the medial aspect at the left frontoparietal junction at the vertex measures 12 mm front to back, 10 mm cephalocaudal an 8 mm right to left. There is surrounding vasogenic edema. No second metastatic lesion is seen. There is a 7 mm meningioma at the left posterior frontal vertex. No evidence of ischemic infarction. No hydrocephalus or extra-axial collection. No skull or skullbase lesion. IMPRESSION: 12 x 10 x 8 mm solitary brain metastasis in the medial aspect of the brain at the frontoparietal vertex. Moderate regional vasogenic edema. 7 mm meningioma at the lateral posterior frontal vertex.  Electronically Signed   By: Nelson Chimes M.D.   On: 09/15/2015 17:29    Medical Problem List and Plan: 1.  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 Follow-up  per radiation oncology Dr. Patric Dykes and oncology services Dr. Burr Medico.   continue decadron 9 mg bid chronically until seen by Oncologist  Pt to have stereotactic treatment on 09/18/15 2.  PE: Xarelto 3. Pain Management: Neurontin 300 mg 3 times a day, baclofen 5 mg 3 times a day. 4. Constipation. Laxative assistance 5. Neuropsych: This patient is capable of making decisions on her own behalf. 6. Skin/Wound Care: Routine skin checks 7. Fluids/Electrolytes/Nutrition: Routine I&O with follow-up chemistries 8. Hypocalcemia: Will consider IV pamidronate if calcium above 11 in 1 week 9. Anemia: Cont to monitor, consider transfusion if necessary. 10. Tachycardia: Continue to monitor with increased activity. Consider medications if necessary  Post Admission Physician Evaluation: 1. Functional deficits secondary toleft brain metastatic mass from colon CA. 2. Patient is admitted to receive collaborative, interdisciplinary care between the physiatrist, rehab nursing staff, and therapy team. 3. Patient's level of medical complexity and substantial therapy needs in context of that medical necessity cannot be provided at a lesser intensity of care such as a SNF. 4. Patient has experienced substantial functional loss from his/her baseline which was documented above under the "Functional History" and "Functional Status" headings. Judging by the patient's diagnosis, physical exam, and functional history, the patient has potential for functional progress which will result in measurable gains while on inpatient rehab. These gains will be of substantial and practical use upon discharge in facilitating mobility and self-care at the household level. 5. Physiatrist will provide 24 hour management of medical  needs as well as oversight of the therapy plan/treatment and provide guidance as appropriate regarding the interaction of the two. 6. 24 hour rehab nursing will assist with safety, skin/wound care, disease management and patient education and help integrate therapy concepts, techniques,education, etc. 7. PT will assess and treat for/with: Lower extremity strength, range of motion, stamina, balance, functional mobility, safety, adaptive techniques and equipment, coping skills, pain control, education. Goals are: Supervision. 8. OT will assess and treat for/with: ADL's, functional mobility, safety, upper extremity strength, adaptive techniques and equipment, ego support, and community reintegration. Goals are: Supervision. Therapy may proceed with showering this patient. 9. Case Management and Social Worker will assess and treat for psychological issues and discharge planning. 10. Team conference will be held weekly to assess progress toward goals and to determine barriers to discharge. 11. Patient will receive at least 3 hours of therapy per day at least 5 days per week. 12. ELOS: 13-17 days. 13. Prognosis: Fair  Delice Lesch, MD 09/17/2015

## 2015-09-17 NOTE — Progress Notes (Signed)
I received call from Dr. Verlon Au that he had spoken with patient and she is clear in goals to have radiation tomorrow followed by transition to inpatient rehab.  She would then like to f/u with Dr. Burr Medico as an OP.  Dr. Burr Medico has noted plan for restaging CT as OP followed by evaluation for any further palliative radiation.  [name] [name] declined to speak further with palliative team at this time.  We will sign off, but remain available to reengage at any point if we can be of further assistance in the care of [name] [name] moving forward.  [name], MD [facility] Team [phone]

## 2015-09-17 NOTE — Progress Notes (Signed)
I contacted [facility] and spoke with Marden Noble to arrange transport for radiation at [facility] center for 4 pm 3/16. Pickup about 1530 and then to return to room 4W24. [phone]

## 2015-09-17 NOTE — Discharge Summary (Signed)
Physician Discharge Summary  [name] [name] [id number] DOB: [birth year] DOA: 09/12/2015  PCP: [facility]  Admit date: 09/12/2015 Discharge date: 09/17/2015  Time spent: 35 minutes  Recommendations for Outpatient Follow-up:  1. continue decadron 9 mg bid chronically until seen by Oncologist 2. consider another dose of pamidronate IV if calcium total still above 11 in 4-7 days 3. Patient is not interested in pursuing palliative care at present time-this can be re-visited once she follows as an OP with oncology 4. Patient will need transport to Oncology and Rad-onc appt's  Discharge Diagnoses:  Principal Problem:   Brain metastases [facility]) Active Problems:   Hypercalcemia   Right leg weakness   Brain metastasis (HCC)   Metastatic colon cancer to liver (HCC)   Anemia of chronic disease   Chronic pulmonary embolism ([place])   Ataxia   Discharge Condition: gaurded  Diet recommendation:  reg  Filed Weights   09/13/15 1317  Weight: 67.132 kg (148 lb)    History of present illness:  64-year ? history of colon cancer which is metastatic to peritoneum, liver and now brain, status post partial colectomy 6 months ago by Dr. Johney Maine, under the care of Dr. Burr Medico oncologist, but could not tolerate chemotherapy, l ong-standing hypercalcemia also being treated by her oncologist,   patient was admitted  complaints of right lower extremity weakness,  workup eventually showed a large brain metastatic lesion, she was started on Decadron with improvement, oncology and radiation oncology along with neurosurgery were involved.   Patient's weakness has improved with Decadron stereotactic radiation treatments by radiation oncology, she is also being evaluated by PT for CIR placement, patient has now refused any further chemotherapy treatments and wants her care to be directed towards comfort.   Currently goal is to improve her calcium and then discharged to either CIR or SNF.  Hospital Course:    1.Right leg weakness and tingling. Likely due to left frontoparietal brain metastases, MRI of the Brain and lumbar spine reviewed and discussed with neurosurgeon Dr. Saintclair Halsted, symptoms likely due to the brain mass. She has shown improvement with IV steroids which will be continued but now twice a day instead of 4 times a day, we have also consulted Dr. Lisbeth Renshaw radiation oncology who is planning to start stereotactic treatments on 09/17/2015, PT on board, right knee immobilizer provided. Patient's oncologist Dr. Burr Medico also involved. This likely is metastatic colon cancer.  Review of supportive care, right knee immobilizer, PT, will require placement to CIR  2. History of metastatic colon cancer. With metastases to liver and peritoneum and now likely to the brain, status post resection by Dr. Johney Maine last year, follows with Dr. Burr Medico oncologist, has also received chemotherapy, Discussed with Dr. Burr Medico, patient has been noncompliant with chemotherapy, last CEA was high and has been trending up. She will follow with oncology outpatient, for now radiation oncology treatments as above. She verbalized on day of d/c that she was not interested any longer in palliative care input-and it was felt she should have this revisited should she decline futher  3. Hypercalcemia. Likely due to malignancy. ACE level borderline high and PTH ++ high, pending PTH related peptide, calcium did not improve much with IV fluids, Lasix, and calcitonin, discussed with oncologist Dr. Burr Medico she recommends a dose of Zometa given 3/14. Oncologist will address PTH levels and workup in the outpatient setting. Discussed with her. Will need Calcium levels checked 4-7 days as OP and potenttial rpt Pamidronate injeciton  4. History of PE.  On xaralto continue. Discussed with neurosurgery okay to continue.  5. GERD. On PPI continue.   Discharge Exam: Filed Vitals:   09/17/15 0555 09/17/15 1300  BP: 118/54 111/63  Pulse: 81 86  Temp: 98.3 F  (36.8 C) 98.1 F (36.7 C)  Resp: 16 16    General: alert pleasant oriented and in NAD Cardiovascular: s1 s2 no m/r/g Respiratory: clear no added sound Neuro-power 5/5 bilaterally  Discharge Instructions   Discharge Instructions    Diet - low sodium heart healthy    Complete by:  As directed      Increase activity slowly    Complete by:  As directed           Current Discharge Medication List    START taking these medications   Details  baclofen (LIORESAL) 10 MG tablet Take 0.5 tablets (5 mg total) by mouth 3 (three) times daily. Qty: 30 each, Refills: 0    dexamethasone (DECADRON) 6 MG tablet Take 1.5 tablets (9 mg total) by mouth 2 (two) times daily with a meal.      CONTINUE these medications which have NOT CHANGED   Details  acetaminophen (TYLENOL) 500 MG tablet Take 500 mg by mouth every 6 (six) hours as needed for moderate pain or headache.     Cyanocobalamin (VITAMIN B 12 PO) Take 1 tablet by mouth every morning.    dextromethorphan (DELSYM) 30 MG/5ML liquid Take 10 mLs by mouth every 12 (twelve) hours as needed for cough.    diphenhydrAMINE (BENADRYL) 25 MG tablet Take 25 mg by mouth every 6 (six) hours as needed (cough).    gabapentin (NEURONTIN) 300 MG capsule Take 300 mg by mouth 3 (three) times daily. Refills: 2    IRON PO Take 1 tablet by mouth every morning.    polyethylene glycol powder (GLYCOLAX/MIRALAX) powder TAKE 34 G (2 DOSES) BY MOUTH DAILY AS NEEDED FOR MILD CONSTIPATION OR MODERATE CONSTIPATION Qty: 255 g, Refills: 0    rivaroxaban (XARELTO) 20 MG TABS tablet Take 1 tablet (20 mg total) by mouth daily with supper. Qty: 30 tablet, Refills: 3      STOP taking these medications     lidocaine-prilocaine (EMLA) cream        Allergies  Allergen Reactions  . Oxycontin [Oxycodone Hcl] Anaphylaxis, Hives and Itching  . Codeine Itching and Nausea And Vomiting      The results of significant diagnostics from this hospitalization  (including imaging, microbiology, ancillary and laboratory) are listed below for reference.    Significant Diagnostic Studies: Dg Lumbar Spine Complete  09/08/2015  CLINICAL DATA:  Low back pain radiating into the left leg with weakness and numbness, recent fall EXAM: LUMBAR SPINE - COMPLETE 4+ VIEW COMPARISON:  Coronal and sagittal reconstructed images through the lumbar spine from a CT scan dated March 03, 2015. Chest x-ray of January 01, 2015 for purposes of vertebral level labeling. FINDINGS: The twelfth ribs are hypoplastic. L5 is transitional. The lumbar vertebral bodies are preserved in height. There is grade 1 anterolisthesis of L3 with respect L4 with mild disc space narrowing. There is moderate disc space narrowing at L4-5. There is no compression fracture. The pedicles and transverse processes appear intact. There is a pseudoarthrosis on the left at L5. IMPRESSION: There is degenerative disc space narrowing with grade 1 anterolisthesis of L3 with respect L4. There is degenerative disc space narrowing at L4-5. There is no compression fracture. If the patient's radicular symptoms persist, MRI would be a useful  next imaging step. Electronically Signed   By: [name]  Martinique M.D.   On: 09/08/2015 13:11   Dg Abd 1 View  09/13/2015  CLINICAL DATA:  Weakness.  Rule out metallic stent. EXAM: ABDOMEN - 1 VIEW COMPARISON:  CT 03/03/2015 FINDINGS: Colonic stent noted 03/03/2015 is no longer visualized. Bowel sutures seen over the pelvis and central abdomen. Moderate stool volume without obstruction. IMPRESSION: No metallic stent over the colon. Electronically Signed   By: Monte Fantasia M.D.   On: 09/13/2015 01:15   Mr [name] [id number] Contrast  09/15/2015  CLINICAL DATA:  Metastatic brain tumor. S RS protocol. Metastatic colon cancer. EXAM: MRI HEAD WITHOUT AND WITH CONTRAST TECHNIQUE: Multiplanar, multiecho pulse sequences of the brain and surrounding structures were obtained without and with intravenous contrast.  CONTRAST:  15 cc MultiHance COMPARISON:  09/13/2015 FINDINGS: Enhancing mass in the medial aspect at the left frontoparietal junction at the vertex measures 12 mm front to back, 10 mm cephalocaudal an 8 mm right to left. There is surrounding vasogenic edema. No second metastatic lesion is seen. There is a 7 mm meningioma at the left posterior frontal vertex. No evidence of ischemic infarction. No hydrocephalus or extra-axial collection. No skull or skullbase lesion. IMPRESSION: 12 x 10 x 8 mm solitary brain metastasis in the medial aspect of the brain at the frontoparietal vertex. Moderate regional vasogenic edema. 7 mm meningioma at the lateral posterior frontal vertex. Electronically Signed   By: Nelson Chimes M.D.   On: 09/15/2015 17:29   Mr [name] [id number] Contrast  09/13/2015  CLINICAL DATA:  Right lower extremity weakness. History of metastatic colon cancer. EXAM: MRI HEAD WITHOUT AND WITH CONTRAST TECHNIQUE: Multiplanar, multiecho pulse sequences of the brain and surrounding structures were obtained without and with intravenous contrast. CONTRAST:  43mL MULTIHANCE GADOBENATE DIMEGLUMINE 529 MG/ML IV SOLN COMPARISON:  None. FINDINGS: The study is mildly motion degraded. There is no evidence of acute infarct, intracranial hemorrhage, midline shift, or extra-axial fluid collection. Ventricles and sulci are normal for age. No significant chronic white matter disease is seen. Within the high medial left frontoparietal region, there is a 1.7 x 1.6 x 1.4 cm intra-axial mass with predominantly peripheral enhancement which is thickest along its lateral margin. There is moderate surrounding vasogenic edema with local sulcal effacement. No other enhancing brain lesions are identified. Orbits are unremarkable. A tiny left maxillary sinus mucous retention cyst is noted. The mastoid air cells are clear. Major intracranial vascular flow voids are preserved. No suspicious skull lesions are identified. IMPRESSION: 1.7 cm left  frontoparietal mass with moderate vasogenic edema, most consistent with solitary metastasis. No midline shift. Electronically Signed   By: Logan Bores M.D.   On: 09/13/2015 08:43   Mr Lumbar Spine Wo Contrast  09/13/2015  CLINICAL DATA:  Initial evaluation for worsening right leg numbness and weakness. EXAM: MRI LUMBAR SPINE WITHOUT CONTRAST TECHNIQUE: Multiplanar, multisequence MR imaging of the lumbar spine was performed. No intravenous contrast was administered. COMPARISON:  Prior study from 09/08/2015. FINDINGS: Transitional lumbosacral anatomy present with partial sacralization of the L5 vertebral body. Mild levoscoliosis of the lumbar spine present. There is trace anterolisthesis of L3 on L4. Vertebral bodies otherwise normally aligned with preservation of the normal lumbar lordosis. Vertebral body heights well preserved. Signal intensity within the vertebral body bone marrow is normal. Benign hemangioma noted within the T12 vertebral body. No other focal osseous lesions. No marrow edema. Conus medullaris terminates normally at the L1 level. Signal intensity within  the visualized cord is normal. Nerve roots of the cauda equina within normal limits. Small amount of presacral free fluid noted, as seen on sagittal STIR sequence (series 5, image 7). This is of uncertain significance. No other acute soft tissue abnormality. Small T2 hyperintense cyst present within the right kidney. Gallbladder within normal limits. No pathologically enlarged retroperitoneal lymph nodes identified. Shotty subcentimeter left periaortic nodes noted. L1-2: Minimal annular disc bulge without focal disc herniation. No stenosis. L2-3: Mild diffuse disc bulge with disc desiccation. No focal disc herniation. Small annular fissure posteriorly. Mild facet and ligamentum flavum hypertrophy. No significant canal stenosis. Foramina are widely patent. L3-4: Trace anterolisthesis of L3 on L4. Mild diffuse disc bulge with disc desiccation.  Associated annular fissure present posteriorly. Right greater than left facet arthrosis. 12 mm synovial cyst at the posterior aspect of the right L3-4 facet. There is mild to moderate canal and lateral recess stenosis at this level. Superimposed right foraminal disc protrusion (series 7, image 20). Protruding disc closely approximates the exiting right L3 nerve root in the right neural foramen (series 4, image 5). This could potentially resultant right lower extremity radicular symptoms. Moderate right foraminal stenosis. No significant left foraminal narrowing. L4-5: Degenerative intervertebral disc space narrowing with endplate changes. Diffuse disc bulge without focal disc herniation. Facet and ligamentum flavum hypertrophy. Mild canal and lateral recess stenosis bilaterally. No significant foraminal narrowing. L5-S1: Transitional lumbosacral anatomy with rudimentary L5-S1 disc. No stenosis. IMPRESSION: 1. Trace anterolisthesis of L3 on L4 with associated disc bulge and superimposed shallow right foraminal disc protrusion. The protruding disc closely approximates the exiting right L3 nerve root, and could potentially result in right lower extremity radicular symptoms. Mild to moderate canal and moderate right foraminal stenosis at this level. 2. Degenerative disc bulge and facet disease at L4-5 with resultant mild canal stenosis. 3. Mild degenerative disc bulge at L2-3 without stenosis or neural impingement. 4. Transitional lumbosacral anatomy with sacralization of the L5 vertebral body. Careful correlation with numbering system on this study as well as plain film radiographs recommended prior to any potential future surgical intervention. 5. Small amount of presacral free fluid/edema, of uncertain significance. Electronically Signed   By: Jeannine Boga M.D.   On: 09/13/2015 04:58   Mr Cervical Spine W Wo Contrast  09/13/2015  CLINICAL DATA:  Worsening right leg numbness weakness. History of metastatic  colon cancer. No acute injury or prior relevant surgery. EXAM: MRI CERVICAL SPINE WITHOUT AND WITH CONTRAST; MRI THORACIC SPINE WITHOUT AND WITH CONTRAST TECHNIQUE: Multiplanar and multiecho pulse sequences of the cervical spine, to include the craniocervical junction and cervicothoracic junction, were obtained according to standard protocol without and with intravenous contrast.; Multiplanar and multiecho pulse sequences of the thoracic spine were obtained without and with intravenous contrast. CONTRAST:  20mL MULTIHANCE GADOBENATE DIMEGLUMINE 529 MG/ML IV SOLN COMPARISON:  Lumbar MRI same date.  Chest CT 03/03/2015. FINDINGS: Scan quality is mildly degraded by motion, especially on the post-contrast images. CERVICAL SPINE FINDINGS: Alignment: There is 4 mm of anterolisthesis at C7-T1. There is also a minimal anterolisthesis at C6-7. Bones: No acute or suspicious osseous findings. There are facet degenerative changes throughout the cervical spine and mild endplate degenerative changes. Cord: Normal in signal and caliber. No abnormal intradural enhancement. Posterior Fossa: Visualized portions unremarkable. Vertebral Arteries: Bilateral vertebral artery flow voids. Paraspinal tissues: Unremarkable. Disc levels: No significant disc space findings at or above C3-4. C4-5: Loss of disc height with mild uncinate spurring and facet hypertrophy. No cord  deformity or foraminal compromise. C5-6: Loss of disc height with posterior osteophytes, uncinate spurring and facet hypertrophy. There is asymmetric mild to moderate right foraminal narrowing. No cord deformity. C6-7: Mild disc bulging, uncinate spurring and facet hypertrophy. No cord deformity or significant foraminal compromise. C7-T1: Advanced facet hypertrophy accounts for the grade 1 anterolisthesis. There is mild disc bulging contributing to mild foraminal narrowing bilaterally. No cord deformity. THORACIC SPINE FINDINGS: Alignment: Mild scoliosis. Anterolisthesis at  C7-T1 as described above. Otherwise normal. Bones: No acute or suspicious osseous findings. No evidence of osseous metastatic disease. T12 hemangioma noted. Cord:  The thoracic cord appears normal in signal and caliber. Paraspinal and other soft tissues: No significant paraspinal abnormalities. Multiple hepatic lesions are partially imaged, corresponding with known hemangiomas and metastatic disease on prior CT. Disc levels: No significant thoracic disc space findings. Specifically, no evidence of disc herniation, spinal stenosis or nerve root encroachment. IMPRESSION: 1. No evidence of metastatic disease within the cervical thoracic spine. 2. Prominent anterolisthesis at C7-T1 due to facet disease. No resulting cord deformity or high-grade foraminal narrowing. 3. Mild additional cervical spondylosis without high-grade spinal stenosis or nerve root encroachment. No significant thoracic disc space findings. 4. Limited visualization of known hepatic lesions. Electronically Signed   By: Richardean Sale M.D.   On: 09/13/2015 08:53   Mr Thoracic Spine W Wo Contrast  09/13/2015  CLINICAL DATA:  Worsening right leg numbness weakness. History of metastatic colon cancer. No acute injury or prior relevant surgery. EXAM: MRI CERVICAL SPINE WITHOUT AND WITH CONTRAST; MRI THORACIC SPINE WITHOUT AND WITH CONTRAST TECHNIQUE: Multiplanar and multiecho pulse sequences of the cervical spine, to include the craniocervical junction and cervicothoracic junction, were obtained according to standard protocol without and with intravenous contrast.; Multiplanar and multiecho pulse sequences of the thoracic spine were obtained without and with intravenous contrast. CONTRAST:  75mL MULTIHANCE GADOBENATE DIMEGLUMINE 529 MG/ML IV SOLN COMPARISON:  Lumbar MRI same date.  Chest CT 03/03/2015. FINDINGS: Scan quality is mildly degraded by motion, especially on the post-contrast images. CERVICAL SPINE FINDINGS: Alignment: There is 4 mm of  anterolisthesis at C7-T1. There is also a minimal anterolisthesis at C6-7. Bones: No acute or suspicious osseous findings. There are facet degenerative changes throughout the cervical spine and mild endplate degenerative changes. Cord: Normal in signal and caliber. No abnormal intradural enhancement. Posterior Fossa: Visualized portions unremarkable. Vertebral Arteries: Bilateral vertebral artery flow voids. Paraspinal tissues: Unremarkable. Disc levels: No significant disc space findings at or above C3-4. C4-5: Loss of disc height with mild uncinate spurring and facet hypertrophy. No cord deformity or foraminal compromise. C5-6: Loss of disc height with posterior osteophytes, uncinate spurring and facet hypertrophy. There is asymmetric mild to moderate right foraminal narrowing. No cord deformity. C6-7: Mild disc bulging, uncinate spurring and facet hypertrophy. No cord deformity or significant foraminal compromise. C7-T1: Advanced facet hypertrophy accounts for the grade 1 anterolisthesis. There is mild disc bulging contributing to mild foraminal narrowing bilaterally. No cord deformity. THORACIC SPINE FINDINGS: Alignment: Mild scoliosis. Anterolisthesis at C7-T1 as described above. Otherwise normal. Bones: No acute or suspicious osseous findings. No evidence of osseous metastatic disease. T12 hemangioma noted. Cord:  The thoracic cord appears normal in signal and caliber. Paraspinal and other soft tissues: No significant paraspinal abnormalities. Multiple hepatic lesions are partially imaged, corresponding with known hemangiomas and metastatic disease on prior CT. Disc levels: No significant thoracic disc space findings. Specifically, no evidence of disc herniation, spinal stenosis or nerve root encroachment. IMPRESSION: 1. No evidence  of metastatic disease within the cervical thoracic spine. 2. Prominent anterolisthesis at C7-T1 due to facet disease. No resulting cord deformity or high-grade foraminal narrowing.  3. Mild additional cervical spondylosis without high-grade spinal stenosis or nerve root encroachment. No significant thoracic disc space findings. 4. Limited visualization of known hepatic lesions. Electronically Signed   By: Richardean Sale M.D.   On: 09/13/2015 08:53   Dg Foot Complete Right  09/12/2015  CLINICAL DATA:  Right foot numbness. Recent falls. Numbness in the right lower extremity. EXAM: RIGHT FOOT COMPLETE - 3+ VIEW COMPARISON:  None. FINDINGS: There is no evidence of fracture or dislocation. There is no evidence of arthropathy or other focal bone abnormality. Soft tissues are unremarkable. Prominent calcification and spurring is noted at the attachment of the Achilles tendon. IMPRESSION: 1. No acute abnormality. 2. Prominent calcification and spurring at the attachment of the Achilles tendon. Electronically Signed   By: San Morelle M.D.   On: 09/12/2015 15:15    Microbiology: No results found for this or any previous visit (from the past 240 hour(s)).   Labs: Basic Metabolic Panel:  Recent Labs Lab 09/13/15 0604 09/14/15 0406 09/14/15 [id number] 09/15/15 0637 09/16/15 0455 09/17/15 0445  NA 142  --  140 140 141 138  K 4.2  --  4.2 4.1 4.2 4.4  CL 108  --  110 112* 109 106  CO2 26  --  19* 20* 23 23  GLUCOSE 102*  --  147* 134* 125* 129*  BUN 10  --  10 13 14 16   CREATININE 0.78  --  0.65 0.60 0.49 0.61  CALCIUM 11.5* 11.5* 11.1* 10.6* 11.4* 11.4*   Liver Function Tests:  Recent Labs Lab 09/14/15 0812 09/15/15 0637 09/16/15 0455 09/17/15 0445  AST 23 19 27  40  ALT 15 15 22  45  ALKPHOS 50 49 53 51  BILITOT 0.2* 0.4 0.5 0.3  PROT 7.5 7.1 7.1 7.0  ALBUMIN 3.7 3.4* 3.3* 3.3*   No results for input(s): LIPASE, AMYLASE in the last 168 hours. No results for input(s): AMMONIA in the last 168 hours. CBC:  Recent Labs Lab 09/13/15 0604  WBC 6.0  NEUTROABS 3.3  HGB 10.8*  HCT 33.2*  MCV 83.0  PLT 360   Cardiac Enzymes: No results for input(s):  CKTOTAL, CKMB, CKMBINDEX, TROPONINI in the last 168 hours. BNP: BNP (last 3 results) No results for input(s): BNP in the last 8760 hours.  ProBNP (last 3 results) No results for input(s): PROBNP in the last 8760 hours.  CBG: No results for input(s): GLUCAP in the last 168 hours.     SignedNita Sells MD   [facility] 09/17/2015, 2:45 PM

## 2015-09-17 NOTE — H&P (Signed)
  Physical Medicine and Rehabilitation Admission H&P    Chief Complaint  Patient presents with  . Fall  . Extremity Weakness  : HPI: [name] [name] is a 65 y.o. female with history of stage IV colon cancer with mets to liver and peritoneum--declined chemo due to concerns of SE, anemia, PE-on Xarelto who was admitted on 09/13/15 with complaints of RUE ataxia, RLE weakness with difficulty walking/falls and RLE paraesthesias. MRI brain done revealing 1.7 cm left frontoparietal mass with moderate vasogenic edema consistent with solitary metastasis. MRI lumbar spine with L3 on L4 anterolisthesis with mild to moderate canal and right foraminal stenosis with protrusion of disc close to L3 nerve root. MRI thoracic and cervical spine negative for metastatic disease. She was placed on steroids for edema management and SRS recommended by Dr. Cram due to concerns of mass located within or close to motor strip and risk of permanent RLE motor deficits. Hypercalcemia noted and she was started on IV lasix, fluids and calcitonin for treatment. Dr. Moody consulted and patient to undergo CT simulation tomorrow with SRS to brain lesion on Thurs or Fri. Dr. Feng/Hem/Onc consulted and patient has elected on palliative radiation only. Palliative care consult good for goals of care. PT ongoing and patient continues to be limited by LLE instability and ataxia, LUE weakness. She has been using a knee immobilizer when walking on the right lower extremity to keep her knee from buckling. CIR recommended for follow up therapy.  ROS HENT: Negative for hearing loss.  Eyes: Negative for blurred vision and double vision.  Respiratory: Negative for cough and shortness of breath.  Cardiovascular: Negative for chest pain and palpitations.  Gastrointestinal: Positive for heartburn. Negative for nausea and constipation.  Genitourinary: Negative for dysuria and urgency.  Musculoskeletal: Positive for joint pain (left hip pain).  Negative for myalgias and back pain.  Neurological: Positive for focal weakness. Negative for dizziness, tingling, sensory change, speech change and headaches.  Psychiatric/Behavioral: Negative for depression. The patient is not nervous/anxious.  All other systems reviewed and are negative   Past Medical History  Diagnosis Date  . Colon cancer (HCC)     dx'd 2014  . Heart murmur     ? as a child   . Pulmonary embolism (HCC) 11/2014   . Anemia     hx of    Past Surgical History  Procedure Laterality Date  . Abdominal hysterectomy  2005  . Cesarean section  1976  . Flexible sigmoidoscopy N/A 05/25/2013    Procedure: FLEXIBLE SIGMOIDOSCOPY;  Surgeon: Daniel P Jacobs, MD;  Location: [facility];  Service: Endoscopy;  Laterality: N/A;  needs floro  . Colonic stent placement N/A 05/25/2013    Procedure: COLONIC STENT PLACEMENT;  Surgeon: Daniel P Jacobs, MD;  Location: [facility];  Service: Endoscopy;  Laterality: N/A;  . Portacath placement Right 06/25/2013    Procedure: ULTRA SOUND GUIDED INSERTION PORT-A-CATH;  Surgeon: Todd J Rosenbower, MD;  Location: [facility];  Service: General;  Laterality: Right;  . Flexible sigmoidoscopy N/A 08/19/2014    Procedure: FLEXIBLE SIGMOIDOSCOPY;  Surgeon: Carl E Gessner, MD;  Location: [facility];  Service: Endoscopy;  Laterality: N/A;  . Colonic stent placement N/A 08/19/2014    Procedure: COLONIC STENT PLACEMENT;  Surgeon: Carl E Gessner, MD;  Location: [facility];  Service: Endoscopy;  Laterality: N/A;  . Flexible sigmoidoscopy N/A 08/19/2014    Procedure: FLEXIBLE SIGMOIDOSCOPY;  Surgeon: Carl E Gessner, MD;  Location: [facility];  Service: Endoscopy;    Laterality: N/A;  . Colonic stent placement N/A 08/19/2014    Procedure: COLONIC STENT PLACEMENT;  Surgeon: Carl E Gessner, MD;  Location: [facility];  Service: Endoscopy;  Laterality: N/A;  . Colon resection N/A 10/28/2014    Procedure: Transverse loop colostomy;  Surgeon:  Todd Rosenbower, MD;  Location: [facility];  Service: General;  Laterality: N/A;  . Colostomy revision N/A 01/03/2015    Procedure: REVISION OF TRANSVERSE LOOP COLOSTOMY WITH PARTIAL  COLECTOMY;  Surgeon: Todd Rosenbower, MD;  Location: [facility];  Service: General;  Laterality: N/A;  . Laparoscopic low anterior resection  03/06/2015    03/06/2015  . Laparoscopic splenectomy  03/06/2015  . Colon resection N/A 03/06/2015    Procedure: LAPAROSCOPIC takedown loop colostomy ;  Surgeon: Steven Gross, MD;  Location: [facility];  Service: General;  Laterality: N/A;  . Splenectomy, total N/A 03/06/2015    Procedure: SPLENECTOMY;  Surgeon: Steven Gross, MD;  Location: [facility];  Service: General;  Laterality: N/A;  . Bowel resection  03/06/2015    Procedure: LOW ANTERIOR BOWEL RESECTION, rigid proctoscopy;  Surgeon: Steven Gross, MD;  Location: [facility];  Service: General;;  . Polypectomy  03/06/2015    Procedure: transverse POLYPECTOMY x 4;  Surgeon: Steven Gross, MD;  Location: [facility];  Service: General;;   Family History  Problem Relation Age of Onset  . Colon cancer Sister 46  . Diabetes Neg Hx   . Thyroid disease Neg Hx   . Breast cancer Maternal Aunt   . Prostate cancer Maternal Uncle   . Bone cancer Maternal Grandfather   . Prostate cancer Maternal Uncle    Social History:  reports that she quit smoking about 44 years ago. Her smoking use included Cigarettes. She quit after 1 year of use. She has never used smokeless tobacco. She reports that she does not drink alcohol or use illicit drugs. Allergies:  Allergies  Allergen Reactions  . Oxycontin [Oxycodone Hcl] Anaphylaxis, Hives and Itching  . Codeine Itching and Nausea And Vomiting   Medications Prior to Admission  Medication Sig Dispense Refill  . acetaminophen (TYLENOL) 500 MG tablet Take 500 mg by mouth every 6 (six) hours as needed for moderate pain or headache.     . Cyanocobalamin (VITAMIN B 12 PO) Take 1 tablet by mouth every morning.    .  dextromethorphan (DELSYM) 30 MG/5ML liquid Take 10 mLs by mouth every 12 (twelve) hours as needed for cough.    . diphenhydrAMINE (BENADRYL) 25 MG tablet Take 25 mg by mouth every 6 (six) hours as needed (cough).    . gabapentin (NEURONTIN) 300 MG capsule Take 300 mg by mouth 3 (three) times daily.  2  . IRON PO Take 1 tablet by mouth every morning.    . polyethylene glycol powder (GLYCOLAX/MIRALAX) powder TAKE 34 G (2 DOSES) BY MOUTH DAILY AS NEEDED FOR MILD CONSTIPATION OR MODERATE CONSTIPATION 255 g 0  . rivaroxaban (XARELTO) 20 MG TABS tablet Take 1 tablet (20 mg total) by mouth daily with supper. 30 tablet 3  . lidocaine-prilocaine (EMLA) cream Apply 1 tsp to site one hour before use, cover with plastic wrap. 1 each PRN    Home: Home Living Family/patient expects to be discharged to:: Private residence Living Arrangements: Spouse/significant other Available Help at Discharge: Family, Available PRN/intermittently (husband works) Type of Home: House Home Layout: One level Home Equipment: Cane - single point   Functional History: Prior Function Level of Independence: Independent with assistive device(s) Comments: independent with   cane prior to last 2 wks  Functional Status:  Mobility: Bed Mobility Overal bed mobility: Needs Assistance Bed Mobility: Supine to Sit, Sit to Supine Supine to sit: Min assist Sit to supine: Min assist General bed mobility comments: Min assist to help progress right leg to EOB and lift right leg back into bed.  Extra time needed to complete tasks Transfers Overall transfer level: Needs assistance Equipment used: Rolling walker (2 wheeled) Transfers: Sit to/from Stand, Stand Pivot Transfers Sit to Stand: Min assist, +2 safety/equipment Stand pivot transfers: Min assist, +2 safety/equipment General transfer comment: Min assist two person for safety/line management.  Pt needs verbal and tactile cues for safe hand placement. Knee immobilized donned in bed  prior to mobilizing.   Ambulation/Gait Ambulation/Gait assistance: Mod assist, +2 safety/equipment Ambulation Distance (Feet): 25 Feet Assistive device: Rolling walker (2 wheeled) Gait Pattern/deviations: Step-to pattern, Decreased dorsiflexion - right, Decreased weight shift to right, Steppage, Scissoring, Ataxic General Gait Details: Pt with decreased right leg coordination. Still buckling despite KI use.  Smaller steps and cues for terminal knee extension help some until fatigued.  Manual assist to keep right hand on RW as well. Chair to follow for safety Gait velocity: decreased    ADL:    Cognition: Cognition Overall Cognitive Status: Impaired/Different from baseline Orientation Level: Oriented X4 Cognition Arousal/Alertness: Awake/alert Behavior During Therapy: Impulsive Overall Cognitive Status: Impaired/Different from baseline Area of Impairment: Safety/judgement, Awareness Current Attention Level: Sustained Safety/Judgement: Decreased awareness of safety, Decreased awareness of deficits Awareness: Emergent Problem Solving: Difficulty sequencing, Requires verbal cues, Requires tactile cues General Comments: right sided inattention, but better awareness.  A bit impulsive and quick to move especially when going to stand and scooting EOB.   Physical Exam: Blood pressure 118/54, pulse 81, temperature 98.3 F (36.8 C), temperature source Oral, resp. rate 16, height 5' 5.5" (1.664 m), weight 67.132 kg (148 lb), SpO2 100 %. Physical Exam Constitutional: She is oriented to person, place, and time. She appears well-developed and well-nourished.  HENT:  Head: Normocephalic and atraumatic.  Mouth/Throat: Oropharynx is clear and moist.  Eyes: Conjunctivae and EOM are normal. Pupils are equal, round, and reactive to light.  Neck: Normal range of motion. Neck supple.  Cardiovascular: Normal rate and regular rhythm.  No murmur heard. Respiratory: Effort normal and breath sounds  normal. No stridor. No respiratory distress. She has no wheezes.  GI: Soft. Bowel sounds are normal. She exhibits no distension. There is no tenderness.  Musculoskeletal: She exhibits no edema or tenderness.  Neurological: She is alert and oriented to person, place, and time.  Speech clear.  Follows basic motor commands without difficulty.  Motor LUE/LLE 5/5. Has some right inattention  RUE: deltoids 3+/5, biceps 4/5, triceps 4/5, intrinsics 4+/5.  RLE: HF 4-/5, KE 2/5, PF/DF 0/5.  Sensation intact to light touch DTRs 3+ throughout  Skin: Skin is warm and dry.  Psychiatric: She has a normal mood and affect. Her behavior is normal. Judgment and thought content normal   Results for orders placed or performed during the hospital encounter of 09/12/15 (from the past 48 hour(s))  Comprehensive metabolic panel     Status: Abnormal   Collection Time: 09/16/15  4:55 AM  Result Value Ref Range   Sodium 141 135 - 145 mmol/L   Potassium 4.2 3.5 - 5.1 mmol/L   Chloride 109 101 - 111 mmol/L   CO2 23 22 - 32 mmol/L   Glucose, Bld 125 (H) 65 - 99 mg/dL   BUN   14 6 - 20 mg/dL   Creatinine, Ser 0.49 0.44 - 1.00 mg/dL   Calcium 11.4 (H) 8.9 - 10.3 mg/dL   Total Protein 7.1 6.5 - 8.1 g/dL   Albumin 3.3 (L) 3.5 - 5.0 g/dL   AST 27 15 - 41 U/L   ALT 22 14 - 54 U/L   Alkaline Phosphatase 53 38 - 126 U/L   Total Bilirubin 0.5 0.3 - 1.2 mg/dL   GFR calc non Af Amer >60 >60 mL/min   GFR calc Af Amer >60 >60 mL/min    Comment: (NOTE) The eGFR has been calculated using the CKD EPI equation. This calculation has not been validated in all clinical situations. eGFR's persistently <60 mL/min signify possible Chronic Kidney Disease.    Anion gap 9 5 - 15  Calcium, ionized     Status: Abnormal   Collection Time: 09/16/15  4:55 AM  Result Value Ref Range   Calcium, Ionized, Serum 6.6 (H) 4.5 - 5.6 mg/dL    Comment: (NOTE) **Results verified by repeat testing** Performed At: [facility] [id number] [place], [place] [id number] [place] F MD Ph:[phone]   Comprehensive metabolic panel     Status: Abnormal   Collection Time: 09/17/15  4:45 AM  Result Value Ref Range   Sodium 138 135 - 145 mmol/L   Potassium 4.4 3.5 - 5.1 mmol/L   Chloride 106 101 - 111 mmol/L   CO2 23 22 - 32 mmol/L   Glucose, Bld 129 (H) 65 - 99 mg/dL   BUN 16 6 - 20 mg/dL   Creatinine, Ser 0.61 0.44 - 1.00 mg/dL   Calcium 11.4 (H) 8.9 - 10.3 mg/dL   Total Protein 7.0 6.5 - 8.1 g/dL   Albumin 3.3 (L) 3.5 - 5.0 g/dL   AST 40 15 - 41 U/L   ALT 45 14 - 54 U/L   Alkaline Phosphatase 51 38 - 126 U/L   Total Bilirubin 0.3 0.3 - 1.2 mg/dL   GFR calc non Af Amer >60 >60 mL/min   GFR calc Af Amer >60 >60 mL/min    Comment: (NOTE) The eGFR has been calculated using the CKD EPI equation. This calculation has not been validated in all clinical situations. eGFR's persistently <60 mL/min signify possible Chronic Kidney Disease.    Anion gap 9 5 - 15   Mr Brain W Wo Contrast  09/15/2015  CLINICAL DATA:  Metastatic brain tumor. S RS protocol. Metastatic colon cancer. EXAM: MRI HEAD WITHOUT AND WITH CONTRAST TECHNIQUE: Multiplanar, multiecho pulse sequences of the brain and surrounding structures were obtained without and with intravenous contrast. CONTRAST:  15 cc MultiHance COMPARISON:  09/13/2015 FINDINGS: Enhancing mass in the medial aspect at the left frontoparietal junction at the vertex measures 12 mm front to back, 10 mm cephalocaudal an 8 mm right to left. There is surrounding vasogenic edema. No second metastatic lesion is seen. There is a 7 mm meningioma at the left posterior frontal vertex. No evidence of ischemic infarction. No hydrocephalus or extra-axial collection. No skull or skullbase lesion. IMPRESSION: 12 x 10 x 8 mm solitary brain metastasis in the medial aspect of the brain at the frontoparietal vertex. Moderate regional vasogenic edema. 7 mm meningioma at the lateral posterior frontal vertex.  Electronically Signed   By: Mark  Shogry M.D.   On: 09/15/2015 17:29    Medical Problem List and Plan: 1.  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 Follow-up   per radiation oncology Dr. Cheryl Cheston and oncology services Dr. Feng.   continue decadron 9 mg bid chronically until seen by Oncologist  Pt to have stereotactic treatment on 09/18/15 2.  PE: Xarelto 3. Pain Management: Neurontin 300 mg 3 times a day, baclofen 5 mg 3 times a day. 4. Constipation. Laxative assistance 5. Neuropsych: This patient is capable of making decisions on her own behalf. 6. Skin/Wound Care: Routine skin checks 7. Fluids/Electrolytes/Nutrition: Routine I&O with follow-up chemistries 8. Hypocalcemia: Will consider IV pamidronate if calcium above 11 in 1 week 9. Anemia: Cont to monitor, consider transfusion if necessary. 10. Tachycardia: Continue to monitor with increased activity. Consider medications if necessary  Post Admission Physician Evaluation: 1. Functional deficits secondary toleft brain metastatic mass from colon CA. 2. Patient is admitted to receive collaborative, interdisciplinary care between the physiatrist, rehab nursing staff, and therapy team. 3. Patient's level of medical complexity and substantial therapy needs in context of that medical necessity cannot be provided at a lesser intensity of care such as a SNF. 4. Patient has experienced substantial functional loss from his/her baseline which was documented above under the "Functional History" and "Functional Status" headings. Judging by the patient's diagnosis, physical exam, and functional history, the patient has potential for functional progress which will result in measurable gains while on inpatient rehab. These gains will be of substantial and practical use upon discharge in facilitating mobility and self-care at the household level. 5. Physiatrist will provide 24 hour management of medical  needs as well as oversight of the therapy plan/treatment and provide guidance as appropriate regarding the interaction of the two. 6. 24 hour rehab nursing will assist with safety, skin/wound care, disease management and patient education and help integrate therapy concepts, techniques,education, etc. 7. PT will assess and treat for/with: Lower extremity strength, range of motion, stamina, balance, functional mobility, safety, adaptive techniques and equipment, coping skills, pain control, education. Goals are: Supervision. 8. OT will assess and treat for/with: ADL's, functional mobility, safety, upper extremity strength, adaptive techniques and equipment, ego support, and community reintegration. Goals are: Supervision. Therapy may proceed with showering this patient. 9. Case Management and Social Worker will assess and treat for psychological issues and discharge planning. 10. Team conference will be held weekly to assess progress toward goals and to determine barriers to discharge. 11. Patient will receive at least 3 hours of therapy per day at least 5 days per week. 12. ELOS: 13-17 days. 13. Prognosis: Fair  Juleen Sorrels, MD 09/17/2015  

## 2015-09-17 NOTE — PMR Pre-admission (Signed)
PMR Admission Coordinator Pre-Admission Assessment  Patient: [name] [name] is an 65 y.o., female MRN: [id number] DOB: 07/23/1950 Height: 5' 5.5" (166.4 cm) Weight: 67.132 kg (148 lb)              Insurance Information HMO:    PPO:      PCP:      IPA:      80/20:      OTHER:  PRIMARY: Medicaid [place] Access      Policy#: [id number]      Subscriber: pt   Pt states Medicare to start [id number]  Medicaid Application Date:       Case Manager:  Disability Application Date:       Case Worker:   Emergency Contact Information Contact Information    Name Relation Home Work Mobile   [name] [name] Spouse   [phone]   [facility] Daughter   [phone]   [name] [name] Daughter   [phone]     Current Medical History  Patient Admitting Diagnosis: left brain metastatic mass History of Present Illness: [name] [name] is a 65 y.o. female with history of stage IV colon cancer with mets to liver and peritoneum--declined chemo due to concerns of SE, anemia, PE-on Xarelto who was admitted on 09/13/15 with complaints of RUE ataxia, RLE weakness with difficulty walking/falls and RLE paraesthesias. MRI brain done revealing 1.7 cm left frontoparietal mass with moderate vasogenic edema consistent with solitary metastasis. MRI lumbar spine with L3 on L4 anterolisthesis with mild to moderate canal and right foraminal stenosis with protrusion of disc close to L3 nerve root. MRI thoracic and cervical spine negative for metastatic disease. She was placed on steroids for edema management and SRS recommended by Dr. Saintclair Halsted due to concerns of mass located within or close to motor strip and risk of permanent RLE motor deficits. Hypercalcemia noted and she was started on IV lasix, fluids and calcitonin for treatment. Dr. Lisbeth Renshaw consulted and patient to undergo CT simulation tomorrow with Scnetx to brain lesion on Thurs or Fri. Dr. Feng/Hem/Onc consulted and patient has elected on palliative radiation only.  PT  ongoing and patient continues to be limited by LLE instability and ataxia, LUE weakness. She has been using a knee immobilizer when walking on the right lower extremity to keep her knee from buckling.    Past Medical History  Past Medical History  Diagnosis Date  . Colon cancer ([place])     dx'd 2014  . Heart murmur     ? as a child   . Pulmonary embolism ([place]) 11/2014   . Anemia     hx of     Family History  family history includes Bone cancer in her maternal grandfather; Breast cancer in her maternal aunt; Colon cancer (age of onset: 80) in her sister; Prostate cancer in her maternal uncle and maternal uncle. There is no history of Diabetes or Thyroid disease.  Prior Rehab/Hospitalizations:  Has the patient had major surgery during 100 days prior to admission? No  Current Medications   Current facility-administered medications:  .  acetaminophen (TYLENOL) tablet 500 mg, 500 mg, Oral, Q6H PRN, [name] Falconer, MD, 500 mg at 09/17/15 0746 .  baclofen (LIORESAL) tablet 5 mg, 5 mg, Oral, TID, [name] Lose, MD, 5 mg at 09/17/15 0857 .  dexamethasone (DECADRON) injection 10 mg, 10 mg, Intravenous, Q12H, [name] Lose, MD, 10 mg at 09/17/15 0856 .  gabapentin (NEURONTIN) capsule 300 mg, 300 mg, Oral, TID, [name] Falconer, MD, 300 mg at 09/17/15 0856 .  gadobenate dimeglumine (MULTIHANCE) injection 15 mL, 15 mL, Intravenous, Once PRN, [name] Lose, MD .  iohexol (OMNIPAQUE) 300 MG/ML solution 25 mL, 25 mL, Oral, Once PRN, [name] Lose, MD .  iohexol (OMNIPAQUE) 300 MG/ML solution 25 mL, 25 mL, Oral, Once PRN, [name] Lose, MD .  LORazepam (ATIVAN) tablet 0.5 mg, 0.5 mg, Oral, PRN, [name] Pedro, PA-C, 0.5 mg at 09/15/15 1549 .  pamidronate (AREDIA) 90 mg in sodium chloride 0.9 % 500 mL IVPB, 90 mg, Intravenous, Once, [name] Lose, MD .  pantoprazole (PROTONIX) EC tablet 40 mg, 40 mg, Oral, Q0600, [name] Falconer, MD, 40 mg at 09/17/15 0856 .  rivaroxaban (XARELTO) tablet 20 mg, 20  mg, Oral, Q supper, [name] Falconer, MD, 20 mg at 09/16/15 1726 .  vitamin B-12 (CYANOCOBALAMIN) tablet 100 mcg, 100 mcg, Oral, Daily, [name] Falconer, MD, 100 mcg at 09/17/15 0857  Patients Current Diet: Diet regular Room service appropriate?: Yes; Fluid consistency:: Thin Diet - low sodium heart healthy  Precautions / Restrictions Precautions Precautions: Fall Precaution Comments: right sided weakness and dyscoordination Restrictions Weight Bearing Restrictions: No   Has the patient had 2 or more falls or a fall with injury in the past year?Yes 3 to 4 falls due to weakness just in past two weeks. Slid from bed, tripped on rug, etc. Pt states only 2 falls and with close misses.  Prior Activity Level Community (5-7x/wk): Mod I with cane and driving until 2 weeks pta. own adls, choir, Nurse, adult / [place] Devices/Equipment: Cane (specify quad or straight) (straight) Home Equipment: Cane - single point  Prior Device Use: Indicate devices/aids used by the patient prior to current illness, exacerbation or injury?  Uses cane pta  Prior Functional Level Prior Function Level of Independence: Independent with assistive device(s) Comments: independent with cane prior to last 2 wks  Self Care: Did the patient need help bathing, dressing, using the toilet or eating?  Independent  Indoor Mobility: Did the patient need assistance with walking from room to room (with or without device)? Independent  Stairs: Did the patient need assistance with internal or external stairs (with or without device)? Independent  Functional Cognition: Did the patient need help planning regular tasks such as shopping or remembering to take medications? Independent  Current Functional Level Cognition  Overall Cognitive Status: Impaired/Different from baseline Current Attention Level: Sustained Orientation Level: Oriented X4 Safety/Judgement: Decreased awareness of safety, Decreased  awareness of deficits General Comments: right sided inattention, but better awareness.  A bit impulsive and quick to move especially when going to stand and scooting EOB.     Extremity Assessment (includes Sensation/Coordination)  Upper Extremity Assessment: RUE deficits/detail RUE Deficits / Details: Right inattention with UE/LE RUE Coordination: decreased fine motor, decreased gross motor  Lower Extremity Assessment: RLE deficits/detail RLE Deficits / Details: RLE weakness noted 3/5, unable to complete range against gravity, poor quad support RLE Sensation: decreased light touch, decreased proprioception RLE Coordination: decreased fine motor, decreased gross motor    ADLs       Mobility  Overal bed mobility: Needs Assistance Bed Mobility: Supine to Sit, Sit to Supine Supine to sit: Min assist Sit to supine: Min assist General bed mobility comments: Min assist to help progress right leg to EOB and lift right leg back into bed.  Extra time needed to complete tasks    Transfers  Overall transfer level: Needs assistance Equipment used: Rolling walker (2 wheeled) Transfers: Sit to/from Stand,  Stand Pivot Transfers Sit to Stand: Min assist, +2 safety/equipment Stand pivot transfers: Min assist, +2 safety/equipment General transfer comment: Min assist two person for safety/line management.  Pt needs verbal and tactile cues for safe hand placement. Knee immobilized donned in bed prior to mobilizing.      Ambulation / Gait / Stairs / Wheelchair Mobility  Ambulation/Gait Ambulation/Gait assistance: Mod assist, +2 safety/equipment Ambulation Distance (Feet): 25 Feet Assistive device: Rolling walker (2 wheeled) Gait Pattern/deviations: Step-to pattern, Decreased dorsiflexion - right, Decreased weight shift to right, Steppage, Scissoring, Ataxic General Gait Details: Pt with decreased right leg coordination. Still buckling despite KI use.  Smaller steps and cues for terminal knee extension  help some until fatigued.  Manual assist to keep right hand on RW as well. Chair to follow for safety Gait velocity: decreased    Posture / Balance Balance Overall balance assessment: Needs assistance Sitting-balance support: Feet supported, No upper extremity supported Sitting balance-Leahy Scale: Good Standing balance support: Single extremity supported, Bilateral upper extremity supported Standing balance-Leahy Scale: Poor    Special needs/care consideration Skin intact Bowel mgmt: continent Bladder mgmt: continent On 3/15/ pt declined palliative consult. Plans to f/u with her own oncologist after d/c.  One dose radiation planned 4 pm 3/16 at [place]. I will schedule [facility] to pick up for appointment On 3/15 pt states her daughters are aware of frontal lesion requiring radiation and overall picture. Pt states her spouse is aware of tc 3/16 at [place], but not fully aware of lesion. He has upcoming mouth surgery at the [facility] in [place] and she does not want to stress him.    Previous Home Environment Living Arrangements: Spouse/significant other  Lives With: Spouse Available Help at Discharge: Family, Available 24 hours/day Type of Home: House Home Layout: One level Home Access: Level entry Bathroom Shower/Tub: Tub/shower unit, Walk-in shower (both are available) Biochemist, clinical: Standard Bathroom Accessibility: Yes How Accessible: Accessible via walker Leary: No  Discharge Living Setting Plans for Discharge Living Setting: Patient's home, Lives with (comment) (spouse) Type of Home at Discharge: House Discharge Home Layout: One level Discharge Home Access: Level entry Discharge Bathroom Shower/Tub: Tub/shower unit, Walk-in shower Discharge Bathroom Toilet: Standard Discharge Bathroom Accessibility: Yes How Accessible: Accessible via walker Does the patient have any problems obtaining your medications?: No  Social/Family/Support Systems Patient Roles: Spouse,  Parent Contact Information: [name] [name], husband and two daughhters, [name] [name] and [name] [name] Anticipated Caregiver: pt's Mom , daughters and family Anticipated Caregiver's Contact Information: see above Ability/Limitations of Caregiver: spouse works days Caregiver Availability: 24/7 Discharge Plan Discussed with Primary Caregiver: Yes Is Caregiver In Agreement with Plan?: Yes Does Caregiver/Family have Issues with Lodging/Transportation while Pt is in Rehab?: No  Spouse self employed Forensic scientist ( a lot foe [place]) and Theme park manager. They have been married for 20 years. Her two daughters fully aware of findings this admission, her spouse is not. I have encouraged her today 3/15 to discuss all finding with her spouse. Pt's 34 yo Mother can provide supervision level as well as friends and family at d/c when her spouse works.  Goals/Additional Needs Patient/Family Goal for Rehab: supervision with PT and OT Expected length of stay: ELOS 14 days Pt/Family Agrees to Admission and willing to participate: Yes Program Orientation Provided & Reviewed with Pt/Caregiver Including Roles  & Responsibilities: Yes   Decrease burden of Care through IP rehab admission: n/a  Possible need for SNF placement upon discharge:not anticipated   Patient Condition: This patient's condition remains  as documented in the consult dated 09/16/15, in which the Rehabilitation Physician determined and documented that the patient's condition is appropriate for intensive rehabilitative care in an inpatient rehabilitation facility. Will admit to inpatient rehab today.  Preadmission Screen Completed By:  [name] [name], 09/17/2015 3:50 PM ______________________________________________________________________   Discussed status with Dr. Posey Pronto on 09/17/15 at  1350 and received telephone approval for admission today.  Admission Coordinator:  [name] [name], time [id number] Date 09/17/15

## 2015-09-18 ENCOUNTER — Inpatient Hospital Stay (HOSPITAL_COMMUNITY): Payer: Medicaid Other | Admitting: Occupational Therapy

## 2015-09-18 ENCOUNTER — Encounter: Payer: Self-pay | Admitting: Radiation Oncology

## 2015-09-18 ENCOUNTER — Ambulatory Visit
Admit: 2015-09-18 | Discharge: 2015-09-18 | Disposition: A | Discharge status: Home / Self Care | Payer: Medicaid Other | Attending: Radiation Oncology | Admitting: Radiation Oncology

## 2015-09-18 ENCOUNTER — Inpatient Hospital Stay (HOSPITAL_COMMUNITY): Payer: Medicaid Other | Admitting: Physical Therapy

## 2015-09-18 ENCOUNTER — Telehealth: Payer: Self-pay | Admitting: *Deleted

## 2015-09-18 DIAGNOSIS — I10 Essential (primary) hypertension: Secondary | ICD-10-CM | POA: Insufficient documentation

## 2015-09-18 DIAGNOSIS — Z510 Encounter for antineoplastic radiation therapy: Secondary | ICD-10-CM | POA: Diagnosis not present

## 2015-09-18 DIAGNOSIS — Z923 Personal history of irradiation: Secondary | ICD-10-CM

## 2015-09-18 DIAGNOSIS — C7931 Secondary malignant neoplasm of brain: Secondary | ICD-10-CM

## 2015-09-18 HISTORY — DX: Personal history of irradiation: Z92.3

## 2015-09-18 LAB — COMPREHENSIVE METABOLIC PANEL
ALBUMIN: 3.3 g/dL — AB (ref 3.5–5.0)
ALK PHOS: 50 U/L (ref 38–126)
ALT: 50 U/L (ref 14–54)
ANION GAP: 8 (ref 5–15)
AST: 31 U/L (ref 15–41)
BILIRUBIN TOTAL: 0.2 mg/dL — AB (ref 0.3–1.2)
BUN: 19 mg/dL (ref 6–20)
CALCIUM: 11.8 mg/dL — AB (ref 8.9–10.3)
CO2: 25 mmol/L (ref 22–32)
Chloride: 106 mmol/L (ref 101–111)
Creatinine, Ser: 0.65 mg/dL (ref 0.44–1.00)
GFR calc non Af Amer: >60 mL/min (ref >60)
GLUCOSE: 129 mg/dL — AB (ref 65–99)
Potassium: 4.5 mmol/L (ref 3.5–5.1)
Sodium: 139 mmol/L (ref 135–145)
TOTAL PROTEIN: 7 g/dL (ref 6.5–8.1)

## 2015-09-18 LAB — CBC WITH DIFFERENTIAL/PLATELET
Basophils Absolute: 0 10*3/uL (ref 0.0–0.1)
Basophils Relative: 0 %
Eosinophils Absolute: 0 10*3/uL (ref 0.0–0.7)
Eosinophils Relative: 0 %
HEMATOCRIT: 35.9 % — AB (ref 36.0–46.0)
HEMOGLOBIN: 12 g/dL (ref 12.0–15.0)
LYMPHS ABS: 1.1 10*3/uL (ref 0.7–4.0)
LYMPHS PCT: 13 %
MCH: 27.6 pg (ref 26.0–34.0)
MCHC: 33.4 g/dL (ref 30.0–36.0)
MCV: 82.7 fL (ref 78.0–100.0)
MONOS PCT: 4 %
Monocytes Absolute: 0.4 10*3/uL (ref 0.1–1.0)
NEUTROS ABS: 7.4 10*3/uL (ref 1.7–7.7)
NEUTROS PCT: 83 %
Platelets: 384 10*3/uL (ref 150–400)
RBC: 4.34 MIL/uL (ref 3.87–5.11)
RDW: 14.8 % (ref 11.5–15.5)
WBC: 8.9 10*3/uL (ref 4.0–10.5)

## 2015-09-18 LAB — CALCIUM, IONIZED: Calcium, Ionized, Serum: 7 mg/dL — ABNORMAL HIGH (ref 4.5–5.6)

## 2015-09-18 NOTE — Progress Notes (Signed)
I met with pt at bedside upon her return from radiation to follow up on our conversations yesterday concerning her husband's being unaware of her current diagnosis and workup. She states he was aware that she had edema/swelling on the brain that has caused her to have nerve damage causing weakness. He is unaware that she has had a radiation treatment and that this is a new metastatic lesion. She has shared the information with her two daughters and Vickii Chafe, a sister. Her spouse has follow up at the [facility] in [place] this week for a pending oral surgery which pt is unaware of the extent for outpt vs inpt surgery for her spouse. She does not want to fully disclose to him her medical findings at this time as not to add more stress to him. Her daughters and sister are in support of this per pt. I have encouraged pt to discuss her workup and findings with her spouse as to fully prepare him for her needs in the future months. [phone]

## 2015-09-18 NOTE — Progress Notes (Signed)
Occupational Therapy Session Note  Patient Details  Name: [name] [name] MRN: [id number] Date of Birth: [birth year]  Today's Date: 09/18/2015 OT Individual Time: 1300-1356 OT Individual Time Calculation (min): 56 min    Short Term Goals: Week 1:  OT Short Term Goal 1 (Week 1): Pt will maintaining standing balance with even weight distribution through LEs during a functional task with min A OT Short Term Goal 2 (Week 1): Pt will setup w/c in prep for transfer with mod VC (supervision) OT Short Term Goal 3 (Week 1): Pt will perform 3/3 toileting tasks with close supervision OT Short Term Goal 4 (Week 1): Pt will use right Ue at dominant level witih mod VC throughout a 60 min session  Skilled Therapeutic Interventions/Progress Updates:  Upon entering the room, pt seated in wheelchair and finishing lunch. Pt was observed to be eating appropriately with R UE with use of utensils. Pt attending to food items on R side of tray. At times however, pt needing min cues for safety with placement on R UE. OT provided total A to don KI on R LE. Pt performing sit <>stand from wheelchair with mod A for lifting assistance. Pt ambulating 10' into bathroom with RW and mod A for balance. Pt having difficulty advancing R UE and requiring tactile cues for weight shift to R for advancement of L LE. Pt required assistance with clothing management for toileting task but able to perform hygiene while seated. Pt donned underwear while seated on commode with assistance to get over [place]. Pt requiring min - mod A for standing balance while pulling underwear over B hips. Pt ambulated back out to sink to wash hands with min A for balance while standing. Pt returning to wheelchair for rest break secondary to fatigue. OT educated pt on OT purpose, POC, and goals with pt verbalizing understanding and agreement. Visitor entering the room during session and pt remaining in wheelchair as therapist exited the room.   Therapy  Documentation Precautions:  Precautions Precautions: Fall Precaution Comments: right sided weakness and dyscoordination Required Braces or Orthoses: Knee Immobilizer - Right Knee Immobilizer - Right: On when out of bed or walking Restrictions Weight Bearing Restrictions: No  See Function Navigator for Current Functional Status.   Therapy/Group: Individual Therapy  Phineas Semen 09/18/2015, 1:58 PM

## 2015-09-18 NOTE — Telephone Encounter (Signed)
[id number] [facility],spoke with patient's RN Barnett Applebaum, she stated yes, [facility] will be there at 330pm to pick patient up to bring to the cancer center for [facility] brain Treatment, patient is positive and up b eat, thanked RN  9:27 AM

## 2015-09-18 NOTE — Evaluation (Signed)
Occupational Therapy Assessment and Plan  Patient Details  Name: [name] [name] MRN: [id number] Date of Birth: 11-Sep-1950  OT Diagnosis: cognitive deficits, hemiplegia affecting dominant side and muscle weakness (generalized) Rehab Potential: Rehab Potential (ACUTE ONLY): Fair ELOS: 10 -12days   Today's Date: 09/18/2015 OT Individual Time: 1100-1200 OT Individual Time Calculation (min): 60 min     Problem List:  Patient Active Problem List   Diagnosis Date Noted  . Benign essential HTN   . Abnormality of gait   . Hemiparesis ([place])   . Acute septic pulmonary embolism without acute cor pulmonale (HCC)   . Anemia of chronic disease   . Chronic pulmonary embolism ([place])   . Ataxia   . Metastatic colon cancer to liver ([place])   . Brain metastases ([place]) 09/13/2015  . Right leg weakness 09/13/2015  . Brain metastasis ([place]) 09/13/2015  . Postoperative anemia due to acute blood loss 03/12/2015  . Long-term (current) use of anticoagulants 03/03/2015  . 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 Pulmonary embolism ([place]) 11/14/2014  . 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 Anxiety   . Upper abdominal pain   . Nausea with vomiting 06/12/2014  . 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 Colorectal cancer, stage IV, obstructing s/p stent x2, ostomy, then LAR 03/06/2015 06/05/2013  . Family history of colorectal cancer 06/05/2013  . Thrush, oral 06/05/2013  . Tachycardia 06/05/2013  . Hypercalcemia 05/24/2013    Past Medical History:  Past Medical History  Diagnosis Date  . Colon cancer ([place])     dx'd 2014  . Heart murmur     ? as a child   . Pulmonary embolism ([place]) 11/2014   . Anemia     hx of    Past Surgical History:  Past Surgical History  Procedure Laterality Date  . Abdominal hysterectomy  2005  . Cesarean section  1976  . Flexible sigmoidoscopy N/A 05/25/2013    Procedure: FLEXIBLE SIGMOIDOSCOPY;  Surgeon: [name], MD;  Location: [facility];  Service:  Endoscopy;  Laterality: N/A;  needs floro  . Colonic stent placement N/A 05/25/2013    Procedure: COLONIC STENT PLACEMENT;  Surgeon: [name], MD;  Location: [facility];  Service: Endoscopy;  Laterality: N/A;  . Portacath placement Right 06/25/2013    Procedure: ULTRA SOUND GUIDED INSERTION PORT-A-CATH;  Surgeon: [name], MD;  Location: [place];  Service: General;  Laterality: Right;  . Flexible sigmoidoscopy N/A 08/19/2014    Procedure: FLEXIBLE SIGMOIDOSCOPY;  Surgeon: Gatha Mayer, MD;  Location: [facility];  Service: Endoscopy;  Laterality: N/A;  . Colonic stent placement N/A 08/19/2014    Procedure: COLONIC STENT PLACEMENT;  Surgeon: Gatha Mayer, MD;  Location: [facility];  Service: Endoscopy;  Laterality: N/A;  . Flexible sigmoidoscopy N/A 08/19/2014    Procedure: FLEXIBLE SIGMOIDOSCOPY;  Surgeon: Gatha Mayer, MD;  [facility]: [facility];  Service: Endoscopy;  Laterality: N/A;  . Colonic stent placement N/A 08/19/2014    Procedure: COLONIC STENT PLACEMENT;  Surgeon: Gatha Mayer, MD;  Location: [facility];  Service: Endoscopy;  Laterality: N/A;  . Colon resection N/A 10/28/2014    Procedure: Transverse loop colostomy;  Surgeon: [name], MD;  Location: [facility];  Service: General;  Laterality: N/A;  . Colostomy revision N/A 01/03/2015    Procedure: REVISION OF TRANSVERSE LOOP COLOSTOMY WITH PARTIAL  COLECTOMY;  Surgeon: [name], MD;  Location: [facility];  Service: General;  Laterality: N/A;  . Laparoscopic  low anterior resection  03/06/2015    03/06/2015  . Laparoscopic splenectomy  03/06/2015  . Colon resection N/A 03/06/2015    Procedure: LAPAROSCOPIC takedown loop colostomy ;  Surgeon: Michael Boston, MD;  Location: [facility];  Service: General;  Laterality: N/A;  . 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 Bowel resection  03/06/2015     Procedure: LOW ANTERIOR BOWEL RESECTION, rigid proctoscopy;  Surgeon: Michael Boston, MD;  Location: [facility];  Service: General;;  . Polypectomy  03/06/2015    Procedure: transverse POLYPECTOMY x 4;  Surgeon: Michael Boston, MD;  Location: [facility];  Service: General;;    Assessment & Plan Clinical Impression: Patient is a 65 y.o. year old female with history of stage IV colon cancer with mets to liver and peritoneum--declined chemo due to concerns of SE, anemia, PE-on Xarelto who was admitted on 09/13/15 with complaints of RUE ataxia, RLE weakness with difficulty walking/falls and RLE paraesthesias. MRI brain done revealing 1.7 cm left frontoparietal mass with moderate vasogenic edema consistent with solitary metastasis. MRI lumbar spine with L3 on L4 anterolisthesis with mild to moderate canal and right foraminal stenosis with protrusion of disc close to L3 nerve root. MRI thoracic and cervical spine negative for metastatic disease. She was placed on steroids for edema management and SRS recommended by Dr. Saintclair Halsted due to concerns of mass located within or close to motor strip and risk of permanent RLE motor deficits. Hypercalcemia noted and she was started on IV lasix, fluids and calcitonin for treatment. Dr. Lisbeth Renshaw consulted and patient to undergo CT simulation tomorrow with [facility] to brain lesion on Thurs or Fri. Dr. Feng/Hem/Onc consulted and patient has elected on palliative radiation only. Palliative care consult good for goals of care. PT ongoing and patient continues to be limited by LLE instability and ataxia, LUE weakness. She has been using a knee immobilizer when walking on the right lower extremity to keep her knee from buckling.Patient transferred to CIR on 09/17/2015 .    Patient currently requires mod with basic self-care skills and basic mobility secondary to muscle weakness, decreased cardiorespiratoy endurance, impaired timing and sequencing, unbalanced muscle activation, decreased coordination and  decreased motor planning, decreased attention to right, decreased attention, decreased awareness and decreased problem solving and decreased standing balance, decreased postural control, hemiplegia and decreased balance strategies.  Prior to hospitalization, patient could complete ADL with independent  but with multiple falls at home.  Patient will benefit from skilled intervention to decrease level of assist with basic self-care skills and increase independence with basic self-care skills prior to discharge home with care partner.  Anticipate patient will require 24 hour supervision and minimal physical assistance and follow up home health.  OT - End of Session Activity Tolerance: Tolerates 30+ min activity with multiple rests Endurance Deficit: Yes OT Assessment Rehab Potential (ACUTE ONLY): Fair OT Patient demonstrates impairments in the following area(s): Balance;Cognition;Edema;Endurance;Motor;Pain;Perception;Sensory;Safety OT Basic ADL's Functional Problem(s): Grooming;Bathing;Dressing;Toileting OT Transfers Functional Problem(s): Toilet;Tub/Shower OT Additional Impairment(s): Fuctional Use of Upper Extremity OT Plan OT Intensity: Minimum of 1-2 x/day, 45 to 90 minutes OT Frequency: 5 out of 7 days OT Duration/Estimated Length of Stay: 10 -12days OT Treatment/Interventions: Cognitive remediation/compensation;Balance/vestibular training;Community reintegration;Discharge planning;DME/adaptive equipment instruction;Neuromuscular re-education;Patient/family education;Self Care/advanced ADL retraining;Functional mobility training;Pain management;Psychosocial support;Skin care/wound managment;Therapeutic Activities;UE/LE Strength taining/ROM;Visual/perceptual remediation/compensation;UE/LE Coordination activities;Therapeutic Exercise;Splinting/orthotics;Wheelchair propulsion/positioning OT Self Feeding Anticipated Outcome(s): n/a OT Basic Self-Care Anticipated Outcome(s):  supervision OT Toileting  Anticipated Outcome(s): supervision OT Bathroom Transfers Anticipated Outcome(s): supervision OT Recommendation Recommendations for Other Services: Speech consult Patient destination: Home Follow Up Recommendations: 24 hour supervision/assistance Equipment Recommended: To be determined   Skilled Therapeutic Intervention 1:1 Ot evaluation initiated with Ot purpose, goals, and role discussed. Self care retraining at shower level. Focus on bed mobility, sit to stands, standing balance , attention to right Le in standing and positioning prior to standing, basic transfer training, activity tolerance, functional problem solving and use of right Ue at dominant level. Pt able to achieve right hip and knee extension in standing and in supine but unable to sustain in standing position- especially in a functional tasks (such as standing in shower to wash periarea). Pt able to perform transfers with max VC for setup and mod A for transfer.  Ambulated from toilet to shower bench (5 feet) to assess control in right knee without KI donned. Pt required max A facilitation for control of LE and max cuing to slow down to attend to right Le positioning. Pt required min cuing for organization for showering all parts and use of right hand. Pt with no clothes at time of eval.   OT Evaluation Precautions/Restrictions  Precautions Precautions: Fall Precaution Comments: right sided weakness and dyscoordination Required Braces or Orthoses: Knee Immobilizer - Right Knee Immobilizer - Right:  (when walking) Restrictions Weight Bearing Restrictions: No General Chart Reviewed: Yes Family/Caregiver Present: No Vital Signs Therapy Vitals Temp: 98.3 F (36.8 C) Temp Source: Oral Pulse Rate: (!) 104 Resp: 18 BP: 117/64 mmHg Patient Position (if appropriate): Sitting Oxygen Therapy SpO2: 100 % O2 Device: Not Delivered Pain  no c/o pain in session Home Living/Prior Functioning Home Living Available Help at  Discharge: Family, Available 24 hours/day, Friend(s) Type of Home: House Home Access: Level entry Home Layout: One level Bathroom Shower/Tub: Tub/shower unit, Multimedia programmer: Associate Professor Accessibility: Yes  Lives With: Spouse Prior Function Level of Independence: Independent with transfers, Independent with gait  Able to Take Stairs?:  (didn't have to take stairs in community setting or at home) Driving: Yes Vocation: Retired Leisure: Hobbies-yes (Comment) Comments: enjoys reading ADL ADL ADL Comments: see functional navigator Vision/Perception  Vision- History Baseline Vision/History: Wears glasses Wears Glasses: Reading only Patient Visual Report: No change from baseline Vision- Assessment Vision Assessment?: No apparent visual deficits Perception Comments: does not attend to right side consistently  Cognition Overall Cognitive Status: Within Functional Limits for tasks assessed Arousal/Alertness: Awake/alert Orientation Level: Person;Place;Situation Person: Oriented Place: Oriented Situation: Oriented Year: 2017 Month: March Day of Week: Correct Memory: Appears intact Immediate Memory Recall: Sock;Blue;Bed Attention: Selective Selective Attention: Appears intact Awareness: Impaired Awareness Impairment: Emergent impairment Problem Solving: Impaired Problem Solving Impairment: Functional basic Executive Function: Writer: Impaired Organizing Impairment: Functional basic Safety/Judgment: Appears intact Sensation Sensation Light Touch: Appears Intact Hot/Cold: Impaired by gross assessment Proprioception: Impaired Detail Proprioception Impaired Details: Impaired RUE;Impaired RLE Coordination Gross Motor Movements are Fluid and Coordinated: No Fine Motor Movements are Fluid and Coordinated: No Coordination and Movement Description: right UE delayed and uncoordination in functional task at times Motor  Motor Motor: Abnormal  postural alignment and control;Hemiplegia Motor - Skilled Clinical Observations: decreased motor planning and control on R Mobility  Bed Mobility Bed Mobility: Supine to Sit;Sit to Supine Supine to Sit: 4: Min guard Supine to Sit Details: Verbal cues for technique;Verbal cues for safe use of DME/AE Sit to Supine: 4: Min assist Sit to Supine - Details: Verbal cues  for technique;Verbal cues for safe use of DME/AE;Manual facilitation for placement Transfers Transfers: Sit to Stand;Stand to Sit Sit to Stand: 4: Min assist;3: Mod assist Sit to Stand Details: Verbal cues for technique;Verbal cues for sequencing;Verbal cues for safe use of DME/AE;Verbal cues for precautions/safety Stand to Sit: 3: Mod assist;4: Min assist Stand to Sit Details (indicate cue type and reason): Verbal cues for technique;Verbal cues for sequencing;Verbal cues for safe use of DME/AE;Verbal cues for precautions/safety  Trunk/Postural Assessment  Cervical Assessment Cervical Assessment: Within Functional Limits Thoracic Assessment Thoracic Assessment: Within Functional Limits Lumbar Assessment Lumbar Assessment: Within Functional Limits Postural Control Postural Control: Deficits on evaluation Righting Reactions: delayed- falls to right in dynamic standing during task with delayed reaction  Balance Balance Balance Assessed: Yes Static Standing Balance Static Standing - Balance Support: Right upper extremity supported;Left upper extremity supported Static Standing - Level of Assistance: 4: Min assist Dynamic Standing Balance Dynamic Standing - Balance Support: Right upper extremity supported;During functional activity Dynamic Standing - Level of Assistance: 3: Mod assist;4: Min assist Dynamic Standing - Balance Activities: Lateral lean/weight shifting;Forward lean/weight shifting;Reaching across midline Extremity/Trunk Assessment RUE Assessment RUE Assessment: Exceptions to [facility] RUE AROM (degrees) Overall AROM  Right Upper Extremity: Within functional limits for tasks performed RUE Strength RUE Overall Strength Comments: 3-/5 RUE Tone RUE Tone: Within Functional Limits LUE Assessment LUE Assessment: Within Functional Limits   See Function Navigator for Current Functional Status.   Refer to Care Plan for Long Term Goals  Recommendations for other services: Other: SLP evaluation  Discharge Criteria: Patient will be discharged from OT if patient refuses treatment 3 consecutive times without medical reason, if treatment goals not met, if there is a change in medical status, if patient makes no progress towards goals or if patient is discharged from hospital.  The above assessment, treatment plan, treatment alternatives and goals were discussed and mutually agreed upon: by patient  Nicoletta Ba 09/18/2015, 3:28 PM

## 2015-09-18 NOTE — Evaluation (Signed)
Physical Therapy Assessment and Plan  Patient Details  Name: [name] [name] MRN: [id number] Date of Birth: [birth year]  PT Diagnosis: Abnormal posture, Ataxic gait, Coordination disorder and Muscle weakness Rehab Potential: Fair ELOS: 7-10   Today's Date: 09/18/2015 PT Individual Time: 0900-1011 PT Individual Time Calculation (min): 71 min    Problem List:  Patient Active Problem List   Diagnosis Date Noted  . Benign essential HTN   . Abnormality of gait   . Hemiparesis ([place])   . Acute septic pulmonary embolism without acute cor pulmonale (HCC)   . Anemia of chronic disease   . Chronic pulmonary embolism ([place])   . Ataxia   . Metastatic colon cancer to liver ([place])   . Brain metastases ([place]) 09/13/2015  . Right leg weakness 09/13/2015  . Brain metastasis ([place]) 09/13/2015  . Postoperative anemia due to acute blood loss 03/12/2015  . Long-term (current) use of anticoagulants 03/03/2015  . 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 Pulmonary embolism ([place]) 11/14/2014  . 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 Anxiety   . Upper abdominal pain   . Nausea with vomiting 06/12/2014  . 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 Colorectal cancer, stage IV, obstructing s/p stent x2, ostomy, then LAR 03/06/2015 06/05/2013  . Family history of colorectal cancer 06/05/2013  . Thrush, oral 06/05/2013  . Tachycardia 06/05/2013  . Hypercalcemia 05/24/2013    Past Medical History:  Past Medical History  Diagnosis Date  . Colon cancer ([place])     dx'd 2014  . Heart murmur     ? as a child   . Pulmonary embolism ([place]) 11/2014   . Anemia     hx of    Past Surgical History:  Past Surgical History  Procedure Laterality Date  . Abdominal hysterectomy  2005  . Cesarean section  1976  . Flexible sigmoidoscopy N/A 05/25/2013    Procedure: FLEXIBLE SIGMOIDOSCOPY;  Surgeon: [name], MD;  Location: [facility];  Service: Endoscopy;  Laterality: N/A;  needs floro  . Colonic stent  placement N/A 05/25/2013    Procedure: COLONIC STENT PLACEMENT;  Surgeon: [name], MD;  Location: [facility];  Service: Endoscopy;  Laterality: N/A;  . Portacath placement Right 06/25/2013    Procedure: ULTRA SOUND GUIDED INSERTION PORT-A-CATH;  Surgeon: [name], MD;  Location: [facility];  Service: General;  Laterality: Right;  . Flexible sigmoidoscopy N/A 08/19/2014    Procedure: FLEXIBLE SIGMOIDOSCOPY;  Surgeon: Gatha Mayer, MD;  Location: [facility];  Service: Endoscopy;  Laterality: N/A;  . Colonic stent placement N/A 08/19/2014    Procedure: COLONIC STENT PLACEMENT;  Surgeon: Gatha Mayer, MD;  [facility]: [facility];  Service: Endoscopy;  Laterality: N/A;  . Flexible sigmoidoscopy N/A 08/19/2014    Procedure: FLEXIBLE SIGMOIDOSCOPY;  Surgeon: Gatha Mayer, MD;  [facility];  Service: Endoscopy;  Laterality: N/A;  . Colonic stent placement N/A 08/19/2014    Procedure: COLONIC STENT PLACEMENT;  Surgeon: Gatha Mayer, MD;  [facility]: [facility];  Service: Endoscopy;  Laterality: N/A;  . Colon resection N/A 10/28/2014    Procedure: Transverse loop colostomy;  Surgeon: [name], MD;  [facility]: [facility];  Service: General;  Laterality: N/A;  . Colostomy revision N/A 01/03/2015    Procedure: REVISION OF TRANSVERSE LOOP COLOSTOMY WITH PARTIAL  COLECTOMY;  Surgeon: [name], MD;  [facility];  Service: General;  Laterality: N/A;  . Laparoscopic low anterior resection  03/06/2015  03/06/2015  . Laparoscopic splenectomy  03/06/2015  . Colon resection N/A 03/06/2015    Procedure: LAPAROSCOPIC takedown loop colostomy ;  Surgeon: Michael Boston, MD;  [facility]: [facility];  Service: General;  Laterality: N/A;  . 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 Bowel resection  03/06/2015    Procedure: LOW ANTERIOR BOWEL RESECTION, rigid proctoscopy;   Surgeon: Michael Boston, MD;  [facility];  Service: General;;  . Polypectomy  03/06/2015    Procedure: transverse POLYPECTOMY x 4;  Surgeon: Michael Boston, MD;  [facility];  Service: General;;    Assessment & Plan Clinical Impression: [name] [name] is a 65 y.o. female with history of stage IV colon cancer with mets to liver and peritoneum--declined chemo due to concerns of SE, anemia, PE-on Xarelto who was admitted on 09/13/15 with complaints of RUE ataxia, RLE weakness with difficulty walking/falls  and RLE paraesthesias. MRI brain done revealing 1.7 cm left frontoparietal mass with moderate vasogenic edema consistent with solitary metastasis. MRI lumbar spine with L3 on L4 anterolisthesis with mild to moderate canal and right foraminal stenosis with protrusion of disc close to L3 nerve root. MRI thoracic and cervical spine negative for metastatic disease. She was placed on steroids for edema management and SRS recommended by Dr. Saintclair Halsted due to concerns of mass located within or close to motor strip and risk of permanent RLE motor deficits. Hypercalcemia noted and she was started on IV lasix, fluids and calcitonin for treatment.  Dr. Lisbeth Renshaw consulted and patient to undergo CT simulation tomorrow with [facility] to brain lesion on Thurs or Fri. Dr. Feng/Hem/Onc consulted and patient has elected on palliative radiation only.  PT ongoing and patient continues to be limited by LLE instability and ataxia, LUE weakness. She has been using a knee immobilizer when walking on the right lower extremity to keep her knee from buckling. Patient transferred to CIR on 09/17/2015 .   Patient currently requires mod with mobility secondary to muscle weakness, ataxia, decreased coordination and decreased motor planning, decreased attention to right and decreased sitting balance, decreased standing balance, decreased postural control and decreased balance strategies.  Prior to hospitalization, patient was modified independent   with mobility and lived with Spouse in a House home.  Home access is  Level entry.  Patient will benefit from skilled PT intervention to maximize safe functional mobility, minimize fall risk and decrease caregiver burden for planned discharge home with 24 hour assist.  Anticipate patient will benefit from follow up [facility] at discharge.  PT - End of Session Activity Tolerance: Tolerates 30+ min activity with multiple rests Endurance Deficit: Yes PT Assessment Rehab Potential (ACUTE/IP ONLY): Fair Barriers to Discharge: Decreased caregiver support;Other (comment) Barriers to Discharge Comments: advanced metastatic cancer with pt declining further treatment  PT Patient demonstrates impairments in the following area(s): Balance;Endurance;Motor;Perception;Safety PT Transfers Functional Problem(s): Bed Mobility;Bed to Chair;Car;Furniture PT Locomotion Functional Problem(s): Wheelchair Mobility;Ambulation PT Plan PT Intensity: Minimum of 1-2 x/day ,45 to 90 minutes PT Frequency: 5 out of 7 days PT Duration Estimated Length of Stay: 7-10 PT Treatment/Interventions: Ambulation/gait training;DME/adaptive equipment instruction;Neuromuscular re-education;Psychosocial support;Stair training;UE/LE Strength taining/ROM;Balance/vestibular training;Discharge planning;Pain management;Therapeutic Activities;UE/LE Coordination activities;Therapeutic Exercise;Splinting/orthotics;Patient/family education;Functional mobility training;Wheelchair propulsion/positioning PT Transfers Anticipated Outcome(s): supervision for lateral scoot and bed mobility, min assist for stand/pivot with LRAD PT Locomotion Anticipated Outcome(s): supervision w/c level, min assist for short distance gait with RW PT Recommendation  Recommendations for Other Services: Neuropsych consult Follow Up Recommendations: Home health PT;24 hour supervision/assistance Patient destination: Home Equipment Recommended: Wheelchair (measurements);Rolling  walker with 5" wheels Equipment Details: 18x18 w/c with basic cushion  Skilled Therapeutic Intervention Pt received resting in bed and agreeable to therapy session.  Skilled treatment began after initial evaluation.  PT instructed pt in functional transfers squat/pivot and stand/pivot with RW requiring min assist initially, progress to mod assist with fatigue throughout session.  Gait training x87' with RW including 2 right turns with min assist, progress to mod with need for assist to manage RLE due to fatigue.  Stair negotiation mod assist for management of RLE due to knee immobilizer and fatigue.  PT provided pt education of ELOS, role of PT, goals for d/c, and plan of care.  Pt verbalized understanding.  Pt left in bed at end of session with call bell in reach and needs met.   PT Evaluation Precautions/Restrictions Precautions Precautions: Fall Precaution Comments: right sided weakness and dyscoordination Required Braces or Orthoses: Knee Immobilizer - Right Knee Immobilizer - Right: On when out of bed or walking Restrictions Weight Bearing Restrictions: No Pain Pain Assessment Pain Assessment: No/denies pain Home Living/Prior Functioning Home Living Available Help at Discharge: Family;Available 24 hours/day;Friend(s) Type of Home: House Home Access: Level entry Home Layout: One level  Lives With: Spouse Prior Function Level of Independence: Independent with transfers;Independent with gait  Able to Take Stairs?:  (didn't have to take stairs in community setting or at home) Driving: Yes Vocation: Retired Leisure: Hobbies-yes (Comment) Comments: enjoys reading Vision/Perception  Perception Comments: intact  Cognition Overall Cognitive Status: Within Functional Limits for tasks assessed Arousal/Alertness: Awake/alert Orientation Level: Oriented X4 Attention: Selective Selective Attention: Appears intact Memory: Appears intact Awareness: Appears  intact Sensation Sensation Light Touch: Appears Intact Coordination Gross Motor Movements are Fluid and Coordinated: No Motor  Motor Motor: Abnormal postural alignment and control;Hemiplegia Motor - Skilled Clinical Observations: decreased motor planning and control on R  Mobility Bed Mobility Bed Mobility: Supine to Sit;Sit to Supine Supine to Sit: 5: Supervision Supine to Sit Details: Verbal cues for technique;Verbal cues for safe use of DME/AE Sit to Supine: 4: Min assist Sit to Supine - Details: Verbal cues for technique;Verbal cues for safe use of DME/AE;Manual facilitation for placement Transfers Transfers: Yes Sit to Stand: 4: Min assist;3: Mod assist (range min>mod depending on fatigue) Sit to Stand Details: Verbal cues for technique;Verbal cues for sequencing;Verbal cues for safe use of DME/AE;Verbal cues for precautions/safety Stand to Sit: 3: Mod assist;4: Min assist (range min>mod depending on fatigue) Stand to Sit Details (indicate cue type and reason): Verbal cues for technique;Verbal cues for sequencing;Verbal cues for safe use of DME/AE;Verbal cues for precautions/safety Locomotion  Ambulation Ambulation: Yes Ambulation/Gait Assistance: 4: Min assist;3: Mod assist (range min>mod depending on fatigue) Ambulation Distance (Feet): 87 Feet Assistive device: Rolling walker Ambulation/Gait Assistance Details: Verbal cues for sequencing;Verbal cues for technique;Verbal cues for safe use of DME/AE;Verbal cues for gait pattern;Verbal cues for precautions/safety;Manual facilitation for placement Ambulation/Gait Assistance Details: progress from min assist for balance to mod assist for balance and advancing RLE when fatigued Gait Gait: Yes Gait Pattern: Impaired Gait Pattern: Step-to pattern;Decreased weight shift to right;Right hip hike;Decreased dorsiflexion - right;Lateral trunk lean to left;Poor foot clearance - right Gait velocity: decreased Stairs / Additional  Locomotion Stairs: Yes Stairs Assistance: 3: Mod assist Stairs Assistance Details: Verbal cues for sequencing;Verbal cues for technique;Verbal cues for gait pattern;Verbal cues for precautions/safety;Manual facilitation for placement;Manual  facilitation for weight shifting Stair Management Technique: Two rails Number of Stairs: 8 Height of Stairs: 3  Trunk/Postural Assessment  Cervical Assessment Cervical Assessment: Within Functional Limits Thoracic Assessment Thoracic Assessment: Within Functional Limits Lumbar Assessment Lumbar Assessment: Within Functional Limits  Balance Balance Balance Assessed: Yes Static Standing Balance Static Standing - Balance Support: Right upper extremity supported;Left upper extremity supported Static Standing - Level of Assistance: 4: Min assist Dynamic Standing Balance Dynamic Standing - Balance Support: Right upper extremity supported;Left upper extremity supported;During functional activity Dynamic Standing - Level of Assistance: 3: Mod assist;4: Min assist (range min>mod depending on fatigue) Dynamic Standing - Balance Activities: Lateral lean/weight shifting;Forward lean/weight shifting;Reaching across midline Extremity Assessment      RLE Assessment RLE Assessment: Exceptions to [facility] RLE AROM (degrees) RLE Overall AROM Comments: WFL in supine RLE Strength RLE Overall Strength Comments: hip flexion 3+/5, knee extension 2+/5, knee flexion 3/5, PF trace, DF absent  LLE Assessment LLE Assessment: Within Functional Limits (4+/5 throughout)   See Function Navigator for Current Functional Status.   Refer to Care Plan for Long Term Goals  Recommendations for other services: Neuropsych  Discharge Criteria: Patient will be discharged from PT if patient refuses treatment 3 consecutive times without medical reason, if treatment goals not met, if there is a change in medical status, if patient makes no progress towards goals or if patient is  discharged from hospital.  The above assessment, treatment plan, treatment alternatives and goals were discussed and mutually agreed upon: by patient  Earnest Conroy Penven-Crew 09/18/2015, 12:34 PM

## 2015-09-18 NOTE — Progress Notes (Signed)
  Radiation Oncology         [phone] ________________________________  Name: [name] [name] MRN: [id number]  Date: 09/16/2015  DOB: 1950-11-30  SIMULATION AND TREATMENT PLANNING NOTE    ICD-9-CM ICD-10-CM   1. Brain metastases ([place]) 198.3 C79.31     DIAGNOSIS:     ICD-9-CM ICD-10-CM   1. Brain metastases ([place]) 198.3 C79.31      NARRATIVE:  The patient was brought to the [facility].  Identity was confirmed.  All relevant records and images related to the planned course of therapy were reviewed.  The patient freely provided informed written consent to proceed with treatment after reviewing the details related to the planned course of therapy. The consent form was witnessed and verified by the simulation staff. Intravenous access was established for contrast administration. Then, the patient was set-up in a stable reproducible supine position for radiation therapy.  A relocatable thermoplastic stereotactic head frame was fabricated for precise immobilization.  CT images were obtained.  Surface markings were placed.  The CT images were loaded into the planning software and fused with the patient's targeting MRI scan.  Then the target and avoidance structures were contoured.  Treatment planning then occurred.  The radiation prescription was entered and confirmed.  I have requested 3D planning  I have requested a DVH of the following structures: Brain stem, brain, left eye, right eye, lenses, optic chiasm, target volumes, uninvolved brain, and normal tissue.    SPECIAL TREATMENT PROCEDURE:  The planned course of therapy using radiation constitutes a special treatment procedure. Special care is required in the management of this patient for the following reasons. This treatment constitutes a Special Treatment Procedure for the following reason: High dose per fraction requiring special monitoring for increased toxicities of treatment including daily imaging.  The special nature of  the planned course of radiotherapy will require increased physician supervision and oversight to ensure patient's safety with optimal treatment outcomes.  PLAN:  The patient will receive 20 Gy in 1 fraction.  ________________________________ ------------------------------------------------  Jodelle Gross, MD, PhD

## 2015-09-18 NOTE — Progress Notes (Signed)
  Radiation Oncology         [phone] ________________________________  Name: SHAKEARA FONT MRN: [id number]  Date: 09/18/2015  DOB: Aug 26, 1950  End of Treatment Note  Diagnosis:      ICD-9-CM ICD-10-CM   1. Brain metastases ([place]) 198.3 C79.31         Indication for treatment:  palliative       Radiation treatment dates:   09/18/2015  Site/dose:    Left frontoparietal lesion target was treated using 2 Arcs to a prescription dose of 20 Gy. ExacTrac Snap verification was performed for each couch angle.    Narrative: The patient tolerated radiation treatment well.   There were no signs of acute toxicity after treatment.  Plan: The patient has completed radiation treatment. The patient will return to radiation oncology clinic for routine followup in one month. I advised the patient to call or return sooner if they have any questions or concerns related to their recovery or treatment. ________________________________  ------------------------------------------------  Jodelle Gross, MD, PhD

## 2015-09-18 NOTE — Progress Notes (Signed)
Patient information reviewed and entered into eRehab system by Tarun Patchell, RN, CRRN, PPS Coordinator.  Information including medical coding and functional independence measure will be reviewed and updated through discharge.    

## 2015-09-18 NOTE — Progress Notes (Signed)
[facility] PHYSICAL MEDICINE & REHABILITATION     PROGRESS NOTE  Subjective/Complaints:  Pt sitting up at the edge of the bed this AM.  She slept very well her first night and is eager to begin therapies.   ROS: Denies CP, SOB, n/v/d.  Objective: Vital Signs: Blood pressure 151/76, pulse 102, temperature 97.6 F (36.4 C), temperature source Oral, SpO2 100 %. No results found.  Recent Labs  09/18/15 0545  WBC 8.9  HGB 12.0  HCT 35.9*  PLT 384    Recent Labs  09/17/15 0445 09/18/15 0545  NA 138 139  K 4.4 4.5  CL 106 106  GLUCOSE 129* 129*  BUN 16 19  CREATININE 0.61 0.65  CALCIUM 11.4* 11.8*   CBG (last 3)  No results for input(s): GLUCAP in the last 72 hours.  Wt Readings from Last 3 Encounters:  09/13/15 67.132 kg (148 lb)  08/01/15 70.489 kg (155 lb 6.4 oz)  06/20/15 70.761 kg (156 lb)    Physical Exam:  BP 151/76 mmHg  Pulse 102  Temp(Src) 97.6 F (36.4 C) (Oral)  SpO2 100%  Constitutional: She appears well-developed and well-nourished. NAD. Vital signs reviewed.  HENT: Normocephalic and atraumatic.  Eyes: Conjunctivae and EOM are normal. Pupils are equal, round, and reactive to light.  Cardiovascular: Normal rate and regular rhythm.No murmur heard. Respiratory: Effort normal and breath sounds normal. No stridor. No respiratory distress. She has no wheezes.  GI: Soft. Bowel sounds are normal. She exhibits no distension. There is no tenderness.  Musculoskeletal: She exhibits no edema or tenderness.  Neurological: She is alert and oriented. Speech clear.  Follows commands without difficulty.  Motor LUE/LLE 5/5. Has some right inattention  RUE: deltoids 4+/5, biceps 4+/5, triceps 4+/5, intrinsics 4+/5.  RLE: HF 4/5, KE 2+/5, PF/DF 0/5.  Skin: Skin is warm and dry.  Psychiatric: She has a normal mood and affect. Her behavior is normal. Judgment and thought content normal  Assessment/Plan: 1. Functional deficits secondary to left brain  metastatic mass from colon CA which require 3+ hours per day of interdisciplinary therapy in a comprehensive inpatient rehab setting. Physiatrist is providing close team supervision and 24 hour management of active medical problems listed below. Physiatrist and rehab team continue to assess barriers to discharge/monitor patient progress toward functional and medical goals.  Function:  Bathing Bathing position      Bathing parts      Bathing assist        Upper Body Dressing/Undressing Upper body dressing                    Upper body assist        Lower Body Dressing/Undressing Lower body dressing                                  Lower body assist        Toileting Toileting          Toileting assist     Transfers Chair/bed transfer             Locomotion Ambulation           Wheelchair          Cognition Comprehension    Expression    Social Interaction    Problem Solving    Memory       Medical Problem List and Plan: 1. Right side weakness with ataxia  secondary to left brain metastatic mass from colon CA. Follow-up per radiation oncology Dr. Patric Dykes and oncology services Dr. Burr Medico.  Continue decadron 9 mg bid chronically until seen by Oncologist Pt to have stereotactic treatment on 09/18/15 2. PE: Xarelto 3. Pain Management: Neurontin 300 mg 3 times a day, baclofen 5 mg 3 times a day. 4. Constipation. Laxative assistance 5. Neuropsych: This patient is capable of making decisions on her own behalf. 6. Skin/Wound Care: Routine skin checks 7. Fluids/Electrolytes/Nutrition: Routine I&O 8. Hypercalcemia: Will consider IV pamidronate if calcium above 11 in 1 week  Ca++ 11.8 on 3/16 9. Anemia: Cont to monitor, consider transfusion if necessary.  Hb 12.0 on 3/16 10. Tachycardia: Continue to monitor with increased activity. Consider medications if necessary 11.  HTN  Pt with one elevated reading  this AM, otherwise in fairly controlled range  Will cont to monitor  LOS (Days) 1 A FACE TO FACE EVALUATION WAS PERFORMED  Dionta Larke Lorie Phenix 09/18/2015 11:36 AM

## 2015-09-18 NOTE — Progress Notes (Signed)
  Radiation Oncology         [phone] ________________________________  Name: [name] [name] MRN: [id number]  Date: 09/18/2015  DOB: [birth year]   SPECIAL TREATMENT PROCEDURE   3D TREATMENT PLANNING AND DOSIMETRY: The patient's radiation plan was reviewed and approved by Dr. Saintclair Halsted from neurosurgery and radiation oncology prior to treatment. It showed 3-dimensional radiation distributions overlaid onto the planning CT/MRI image set. The [facility] for the target structures as well as the organs at risk were reviewed. The documentation of the 3D plan and dosimetry are filed in the radiation oncology EMR.   NARRATIVE: The patient was brought to the [employer] stereotactic radiation treatment machine and placed supine on the CT couch. The head frame was applied, and the patient was set up for stereotactic radiosurgery. Neurosurgery was present for the set-up and delivery   SIMULATION VERIFICATION: In the couch zero-angle position, the patient underwent Exactrac imaging using the Brainlab system with orthogonal KV images. These were carefully aligned and repeated to confirm treatment position for each of the isocenters. The Exactrac snap film verification was repeated at each couch angle.   SPECIAL TREATMENT PROCEDURE: The patient received stereotactic radiosurgery to the following target:  Left frontoparietal lesion target was treated using 2 Arcs to a prescription dose of 20 Gy. ExacTrac Snap verification was performed for each couch angle.   STEREOTACTIC TREATMENT MANAGEMENT: Following delivery, the patient was transported to nursing in stable condition and monitored for possible acute effects. Vital signs were recorded . The patient tolerated treatment without significant acute effects, and was discharged to home in stable condition.  PLAN: Follow-up in one month.   ------------------------------------------------  Jodelle Gross, MD, PhD

## 2015-09-18 NOTE — Progress Notes (Signed)
[facility] called by Elmo Putt RN  at 415pm for pt pick up,they had left at 405pm

## 2015-09-19 ENCOUNTER — Inpatient Hospital Stay (HOSPITAL_COMMUNITY): Payer: Medicaid Other | Admitting: Physical Therapy

## 2015-09-19 ENCOUNTER — Inpatient Hospital Stay (HOSPITAL_COMMUNITY): Payer: Medicaid Other | Admitting: Occupational Therapy

## 2015-09-19 ENCOUNTER — Other Ambulatory Visit: Payer: Medicaid Other

## 2015-09-19 ENCOUNTER — Inpatient Hospital Stay (HOSPITAL_COMMUNITY): Payer: Medicaid Other | Admitting: Speech Pathology

## 2015-09-19 DIAGNOSIS — K120 Recurrent oral aphthae: Secondary | ICD-10-CM | POA: Insufficient documentation

## 2015-09-19 MED ORDER — FLUOCINONIDE 0.05 % EX GEL
Freq: Four times a day (QID) | CUTANEOUS | Status: DC
  Administered 2015-09-19: 19:00:00 via TOPICAL
  Administered 2015-09-19: 1 via TOPICAL
  Administered 2015-09-19 – 2015-09-30 (×20): via TOPICAL
  Filled 2015-09-19 (×2): qty 15

## 2015-09-19 NOTE — Evaluation (Signed)
Speech Language Pathology Assessment and Plan  Patient Details  Name: [name] [name] MRN: [id number] Date of Birth: 04/02/1951  SLP Diagnosis: Cognitive Impairments  Rehab Potential: Excellent ELOS: 10-12 days     Today's Date: 09/19/2015 SLP Individual Time: 1430-1530 SLP Individual Time Calculation (min): 60 min   Problem List:  Patient Active Problem List   Diagnosis Date Noted  . Recurrent aphthous stomatitis   . Benign essential HTN   . Abnormality of gait   . Hemiparesis ([place])   . Acute septic pulmonary embolism without acute cor pulmonale (HCC)   . Anemia of chronic disease   . Chronic pulmonary embolism ([place])   . Ataxia   . Metastatic colon cancer to liver ([place])   . Brain metastases ([place]) 09/13/2015  . Right leg weakness 09/13/2015  . Brain metastasis ([place]) 09/13/2015  . Postoperative anemia due to acute blood loss 03/12/2015  . Long-term (current) use of anticoagulants 03/03/2015  . 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 Pulmonary embolism ([place]) 11/14/2014  . 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 Anxiety   . Upper abdominal pain   . Nausea with vomiting 06/12/2014  . 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 Colorectal cancer, stage IV, obstructing s/p stent x2, ostomy, then LAR 03/06/2015 06/05/2013  . Family history of colorectal cancer 06/05/2013  . Thrush, oral 06/05/2013  . Tachycardia 06/05/2013  . Hypercalcemia 05/24/2013   Past Medical History:  Past Medical History  Diagnosis Date  . Colon cancer ([place])     dx'd 2014  . Heart murmur     ? as a child   . Pulmonary embolism ([place]) 11/2014   . Anemia     hx of    Past Surgical History:  Past Surgical History  Procedure Laterality Date  . Abdominal hysterectomy  2005  . Cesarean section  1976  . Flexible sigmoidoscopy N/A 05/25/2013    Procedure: FLEXIBLE SIGMOIDOSCOPY;  Surgeon: [name], MD;  Location: [facility];  Service: Endoscopy;  Laterality: N/A;  needs floro  . Colonic  stent placement N/A 05/25/2013    Procedure: COLONIC STENT PLACEMENT;  Surgeon: [name], MD;  Location: [facility];  Service: Endoscopy;  Laterality: N/A;  . Portacath placement Right 06/25/2013    Procedure: ULTRA SOUND GUIDED INSERTION PORT-A-CATH;  Surgeon: [name], MD;  Location: [place];  Service: General;  Laterality: Right;  . Flexible sigmoidoscopy N/A 08/19/2014    Procedure: FLEXIBLE SIGMOIDOSCOPY;  Surgeon: Gatha Mayer, MD;  Location: [facility];  Service: Endoscopy;  Laterality: N/A;  . Colonic stent placement N/A 08/19/2014    Procedure: COLONIC STENT PLACEMENT;  Surgeon: Gatha Mayer, MD;  Location: [facility];  Service: Endoscopy;  Laterality: N/A;  . Flexible sigmoidoscopy N/A 08/19/2014    Procedure: FLEXIBLE SIGMOIDOSCOPY;  Surgeon: Gatha Mayer, MD;  [facility]: [facility];  Service: Endoscopy;  Laterality: N/A;  . Colonic stent placement N/A 08/19/2014    Procedure: COLONIC STENT PLACEMENT;  Surgeon: Gatha Mayer, MD;  Location: [facility];  Service: Endoscopy;  Laterality: N/A;  . Colon resection N/A 10/28/2014    Procedure: Transverse loop colostomy;  Surgeon: [name], MD;  Location: [facility];  Service: General;  Laterality: N/A;  . Colostomy revision N/A 01/03/2015    Procedure: REVISION OF TRANSVERSE LOOP COLOSTOMY WITH PARTIAL  COLECTOMY;  Surgeon: [name], MD;  Location: [facility];  Service: General;  Laterality: N/A;  . Laparoscopic low anterior resection  03/06/2015  03/06/2015  . Laparoscopic splenectomy  03/06/2015  . Colon resection N/A 03/06/2015    Procedure: LAPAROSCOPIC takedown loop colostomy ;  Surgeon: Michael Boston, MD;  Location: [facility];  Service: General;  Laterality: N/A;  . 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 Bowel resection  03/06/2015    Procedure: LOW ANTERIOR BOWEL RESECTION, rigid  proctoscopy;  Surgeon: Michael Boston, MD;  Location: [facility];  Service: General;;  . Polypectomy  03/06/2015    Procedure: transverse POLYPECTOMY x 4;  Surgeon: Michael Boston, MD;  Location: [facility];  Service: General;;    Assessment / Plan / Recommendation Clinical Impression Patient is a 65 y.o. female with history of stage IV colon cancer with mets to liver and peritoneum--declined chemo due to concerns of SE, anemia, PE-on Xarelto who was admitted on 09/13/15 with complaints of RUE ataxia, RLE weakness with difficulty walking/falls and RLE paraesthesias. MRI brain done revealing 1.7 cm left frontoparietal mass with moderate vasogenic edema consistent with solitary metastasis. MRI lumbar spine with L3 on L4 anterolisthesis with mild to moderate canal and right foraminal stenosis with protrusion of disc close to L3 nerve root. MRI thoracic and cervical spine negative for metastatic disease. She was placed on steroids for edema management and SRS recommended by Dr. Saintclair Halsted due to concerns of mass located within or close to motor strip and risk of permanent RLE motor deficits. Hypercalcemia noted and she was started on IV lasix, fluids and calcitonin for treatment.  Dr. Lisbeth Renshaw consulted and patient to undergo CT simulation tomorrow with [facility] to brain lesion on Thurs or Fri. Dr. Feng/Hem/Onc consulted and patient has elected on palliative radiation only. Palliative care consult good for goals of care. PT ongoing and patient continues to be limited by RLE instability and ataxia, RUE weakness. She has been using a knee immobilizer when walking on the right lower extremity to keep her knee from buckling. CIR recommended for follow up therapy and patient admitted 3/15. Patient demonstrates mild-moderate cognitive impairments impacting attention, problem solving, recall, awareness and overall safety with functional and familiar tasks. Patient would benefit from skilled SLP intervention to maximize cognitive function and  overall functional independence prior to discharge home.    Skilled Therapeutic Interventions          Administered a cognitive-linguistic evaluation. Please see above for details. Also administered the MoCA-Blind in which patient scored 15/22 points with a score of 18 or above considered normal. Educated patient in regards to her current cognitive impairments and goals of skilled SLP intervention. She verbalized understanding.   SLP Assessment  Patient will need skilled [place] [facility] during CIR admission    Recommendations  Oral Care Recommendations: Oral care BID Recommendations for Other Services: Neuropsych consult Patient destination: Home Follow up Recommendations: Outpatient SLP;24 hour supervision/assistance    SLP Frequency 3 to 5 out of 7 days   SLP Duration  SLP Intensity  SLP Treatment/Interventions 10-12 days   Minumum of 1-2 x/day, 30 to 90 minutes  Cognitive remediation/compensation;Cueing hierarchy;Functional tasks;Patient/family education;Internal/external aids;Environmental controls;Therapeutic Activities    Pain No/Denies Pain  Function:  Cognition Comprehension Comprehension assist level: Follows basic conversation/direction with extra time/assistive device  Expression   Expression assist level: Expresses basic needs/ideas: With no assist  Social Interaction Social Interaction assist level: Interacts appropriately 90% of the time - Needs monitoring or encouragement for participation or interaction.  Problem  Solving Problem solving assist level: Solves basic 50 - 74% of the time/requires cueing 25 - 49% of the time  Memory Memory assist level: Recognizes or recalls 50 - 74% of the time/requires cueing 25 - 49% of the time   Short Term Goals: Week 1: SLP Short Term Goal 1 (Week 1): Patient will demonstrate functional problem solving for familiar tasks with Min A verbal and question cues.  SLP Short Term Goal 2 (Week 1): Patient will  demonstrate selective attention to functional tasks in a mildly distracting enviornment with Min A verbal cues for redirection for 30 minutes.  SLP Short Term Goal 3 (Week 1): Patient will attend to RUE and RLE during functional tasks with Min A verbal and question cues.  SLP Short Term Goal 4 (Week 1): Patient will utilize external memory aids to recall new, daily information with Min A verbal and question cues.   Refer to Care Plan for Long Term Goals  Recommendations for other services: Neuropsych  Discharge Criteria: Patient will be discharged from SLP if patient refuses treatment 3 consecutive times without medical reason, if treatment goals not met, if there is a change in medical status, if patient makes no progress towards goals or if patient is discharged from hospital.  The above assessment, treatment plan, treatment alternatives and goals were discussed and mutually agreed upon: by patient  Dalynn Jhaveri 09/19/2015, 4:18 PM

## 2015-09-19 NOTE — Progress Notes (Signed)
Occupational Therapy Session Note  Patient Details  Name: [name] [name] MRN: [id number] Date of Birth: 09/19/1950  Today's Date: 09/19/2015 OT Individual Time: 0800-0900 OT Individual Time Calculation (min): 60 min    Short Term Goals: Week 1:  OT Short Term Goal 1 (Week 1): Pt will maintaining standing balance with even weight distribution through LEs during a functional task with min A OT Short Term Goal 2 (Week 1): Pt will setup w/c in prep for transfer with mod VC (supervision) OT Short Term Goal 3 (Week 1): Pt will perform 3/3 toileting tasks with close supervision OT Short Term Goal 4 (Week 1): Pt will use right Ue at dominant level witih mod VC throughout a 60 min session  Skilled Therapeutic Interventions/Progress Updates:    1:1 focus on self care retraining to including dressing sit to stand. Pt reports "washing up at bedside" prior to therapy.  Engaged in teaching hemi dressing techniques with mod VC for follow through. Sit to stand and controlled stand to sit without UE support to promote forced use of right LE and even weight bearing with min to mod A for balance.  W/c propulsion in the hallway with use of bilateral UEs and left LE.  Pt required max to total cuing to incorporate right hand and left foot; challenged with coordination and attention to right.  Engaged in eating breakfast with focus on right hand coordination with setup and eating with right. When pt not visually attending to right when performing a task pt with decr performance/ coordination.   Transitioned to the gym and focus on core control, attention to right LE, heavy weight bearing, trunk alignment in activity and activation of right side, etc. Perform reps on kinetron in sitting and standing with bilateral hands and then progressed to only right hand. Pt required max VC for attention to quality of movement and mod facilitation for control   Pt reported she did share with her husband last night about her brain  lesion.    Therapy Documentation Precautions:  Precautions Precautions: Fall Precaution Comments: right sided weakness and dyscoordination Required Braces or Orthoses: Knee Immobilizer - Right Knee Immobilizer - Right:  (when walking) Restrictions Weight Bearing Restrictions: No Pain:  oral sore in her mouth- made MD aware ADL: ADL ADL Comments: see functional navigator     See Function Navigator for Current Functional Status.   Therapy/Group: Individual Therapy  Willeen Cass [facility] 09/19/2015, 8:35 AM

## 2015-09-19 NOTE — IPOC Note (Signed)
Overall Plan of Care [facility]) Patient Details Name: CLEOPHAS GAYDON MRN: [id number] DOB: 09-28-1950  Admitting Diagnosis: BRAIN METS  Hospital Problems: Active Problems:   Brain metastasis ([place])   Abnormality of gait   Hemiparesis ([place])   Acute septic pulmonary embolism without acute cor pulmonale (HCC)   Benign essential HTN     Functional Problem List: Nursing Endurance, Medication Management, Nutrition, Pain, Safety  PT Balance, Endurance, Motor, Perception, Safety  OT Balance, Cognition, Edema, Endurance, Motor, Pain, Perception, Sensory, Safety  SLP    TR         Basic ADL's: OT Grooming, Bathing, Dressing, Toileting     Advanced  ADL's: OT       Transfers: PT Bed Mobility, Bed to Chair, Car, Manufacturing systems engineer, Metallurgist: PT Emergency planning/management officer, Ambulation     Additional Impairments: OT Fuctional Use of Upper Extremity  SLP        TR      Anticipated Outcomes Item Anticipated Outcome  Self Feeding n/a  Swallowing      Basic self-care  supervision  Toileting  supervision   Bathroom Transfers supervision  Bowel/Bladder  Mod I  Transfers  supervision for lateral scoot and bed mobility, min assist for stand/pivot with LRAD  Locomotion  supervision w/c level, min assist for short distance gait with RW  Communication     Cognition     Pain  3 or less  Safety/Judgment  Mod I   Therapy Plan: PT Intensity: Minimum of 1-2 x/day ,45 to 90 minutes PT Frequency: 5 out of 7 days PT Duration Estimated Length of Stay: 7-10 OT Intensity: Minimum of 1-2 x/day, 45 to 90 minutes OT Frequency: 5 out of 7 days OT Duration/Estimated Length of Stay: 10 -12days         Team Interventions: Nursing Interventions Patient/Family Education, Disease Management/Prevention, Pain Management, Medication Management  PT interventions Ambulation/gait training, DME/adaptive equipment instruction, Neuromuscular re-education, Psychosocial support, Stair  training, UE/LE Strength taining/ROM, Medical illustrator training, Discharge planning, Pain management, Therapeutic Activities, UE/LE Coordination activities, Therapeutic Exercise, Splinting/orthotics, Patient/family education, Functional mobility training, Wheelchair propulsion/positioning  OT Interventions Cognitive remediation/compensation, Training and development officer, Academic librarian, Discharge planning, Engineer, drilling, Neuromuscular re-education, Patient/family education, Self Care/advanced ADL retraining, Functional mobility training, Pain management, Psychosocial support, Skin care/wound managment, Therapeutic Activities, UE/LE Strength taining/ROM, Visual/perceptual remediation/compensation, UE/LE Coordination activities, Therapeutic Exercise, Splinting/orthotics, Wheelchair propulsion/positioning  SLP Interventions    TR Interventions    SW/CM Interventions Discharge Planning, Psychosocial Support, Patient/Family Education    Team Discharge Planning: Destination: PT-Home ,OT- Home , SLP-  Projected Follow-up: PT-Home health PT, 24 hour supervision/assistance, OT-  24 hour supervision/assistance, SLP-  Projected Equipment Needs: PT-Wheelchair (measurements), Rolling walker with 5" wheels, OT- To be determined, SLP-  Equipment Details: PT-18x18 w/c with basic cushion, OT-  Patient/family involved in discharge planning: PT- Patient,  OT-Patient, SLP-   MD ELOS: 8-12 days. Medical Rehab Prognosis:  Fair Assessment:  65 y.o. female with history of stage IV colon cancer with mets to liver and peritoneum--declined chemo due to concerns of SE, anemia, PE-on Xarelto who was admitted on 09/13/15 with complaints of RUE ataxia, RLE weakness with difficulty walking/falls and RLE paraesthesias. MRI brain done revealing 1.7 cm left frontoparietal mass with moderate vasogenic edema consistent with solitary metastasis. MRI lumbar spine with L3 on L4 anterolisthesis with mild to  moderate canal and right foraminal stenosis with protrusion of disc close to L3 nerve root. MRI thoracic and cervical  spine negative for metastatic disease. She was placed on steroids for edema management and SRS recommended by Dr. Saintclair Halsted due to concerns of mass located within or close to motor strip and risk of permanent RLE motor deficits. Hypercalcemia noted and she was started on IV lasix, fluids and calcitonin for treatment. Dr. Lisbeth Renshaw consulted and patient underwent CT simulation with SRS to brain lesion. Dr. Feng/Hem/Onc consulted and patient has elected on palliative radiation only. PT ongoing and patient continues to be limited by LLE instability and ataxia, LUE weakness. She has been using a knee immobilizer when walking on the right lower extremity to keep her knee from buckling.   See Team Conference Notes for weekly updates to the plan of care

## 2015-09-19 NOTE — Progress Notes (Signed)
[facility]ICINE [facility] REHABILITATION     PROGRESS NOTE  Subjective/Complaints:  Pt sitting up in her chair working with OT.  She slept well overnight and did not have any problems with the procedure yesterday.  She feels she is gradually improving.    ROS: Denies CP, SOB, n/v/d.  Objective: Vital Signs: Blood pressure 113/60, pulse 68, temperature 97.6 F (36.4 C), temperature source Oral, resp. rate 17, SpO2 100 %. No results found.  Recent Labs  09/18/15 0545  WBC 8.9  HGB 12.0  HCT 35.9*  PLT 384    Recent Labs  09/17/15 0445 09/18/15 0545  NA 138 139  K 4.4 4.5  CL 106 106  GLUCOSE 129* 129*  BUN 16 19  CREATININE 0.61 0.65  CALCIUM 11.4* 11.8*   CBG (last 3)  No results for input(s): GLUCAP in the last 72 hours.  Wt Readings from Last 3 Encounters:  09/13/15 67.132 kg (148 lb)  08/01/15 70.489 kg (155 lb 6.4 oz)  06/20/15 70.761 kg (156 lb)    Physical Exam:  BP 113/60 mmHg  Pulse 68  Temp(Src) 97.6 F (36.4 C) (Oral)  Resp 17  SpO2 100%  Constitutional: She appears well-developed. NAD. Vital signs reviewed.  HENT: Normocephalic and atraumatic.  Mouth: Oral ulcers Eyes: Conjunctivae and EOM are normal.  Cardiovascular: Normal rate and regular rhythm.No murmur heard. Respiratory: Effort normal and breath sounds normal. No stridor. No respiratory distress. She has no wheezes.  GI: Soft. Bowel sounds are normal. She exhibits no distension. There is no tenderness.  Musculoskeletal: She exhibits no edema or tenderness.  Neurological: She is alert and oriented. Speech clear.  Follows commands without difficulty.  Motor LUE/LLE 5/5.   RUE: deltoids 4+/5, biceps 4+/5, triceps 4+/5, intrinsics 4+/5.  RLE: HF 4-/5, KE 4-/5, PF/DF 2/5.  Skin: Skin is warm and dry.  Psychiatric: She has a normal mood and affect. Her behavior is normal. Judgment and thought content normal  Assessment/Plan: 1. Functional deficits secondary to left brain  metastatic mass from colon CA which require 3+ hours per day of interdisciplinary therapy in a comprehensive inpatient rehab setting. Physiatrist is providing close team supervision and 24 hour management of active medical problems listed below. Physiatrist and rehab team continue to assess barriers to discharge/monitor patient progress toward functional and medical goals.  Function:  Bathing Bathing position   Position: Shower  Bathing parts Body parts bathed by patient: Right arm, Left arm, Abdomen, Chest, Front perineal area, Buttocks, Right upper leg, Left upper leg, Back Body parts bathed by helper: Right lower leg, Left lower leg  Bathing assist Assist Level:  (pt reported bathing all parts at bed level before tx- didnt wash feet )      Upper Body Dressing/Undressing Upper body dressing   What is the patient wearing?: Bra, Pull over shirt/dress Bra - Perfomed by patient: Thread/unthread right bra strap, Thread/unthread left bra strap, Hook/unhook bra (pull down sports bra)   Pull over shirt/dress - Perfomed by patient: Thread/unthread right sleeve, Thread/unthread left sleeve, Put head through opening, Pull shirt over trunk          Upper body assist Assist Level: Set up   Set up : To obtain clothing/put away  Lower Body Dressing/Undressing Lower body dressing   What is the patient wearing?: Underwear, Pants, [employer], Shoes Underwear - Performed by patient: Thread/unthread left underwear leg, Pull underwear up/down Underwear - Performed by helper: Thread/unthread right underwear leg Pants- Performed by patient:  Thread/unthread left pants leg, Pull pants up/down, Thread/unthread right pants leg   Non-skid slipper socks- Performed by patient: Don/doff left sock Non-skid slipper socks- Performed by helper: Don/doff right sock     Shoes - Performed by patient: Don/doff left shoe, Fasten right, Fasten left Shoes - Performed by helper: Don/doff right shoe       TED Hose -  Performed by helper: Don/doff right TED hose, Don/doff left TED hose  Lower body assist Assist for lower body dressing: Touching or steadying assistance (Pt > 75%)      Toileting Toileting   Toileting steps completed by patient: Performs perineal hygiene, Adjust clothing after toileting Toileting steps completed by helper: Adjust clothing prior to toileting    Toileting assist Assist level: Touching or steadying assistance (Pt.75%)   Transfers Chair/bed transfer   Chair/bed transfer method: Stand pivot, Squat pivot Chair/bed transfer assist level: Moderate assist (Pt 50 - 74%/lift or lower) (initially min, progress to mod throughout session with fatigue_) Chair/bed transfer assistive device: Armrests, Medical sales representative     Max distance: 87 Assist level: Moderate assist (Pt 50 - 74%)   Wheelchair   Type: Manual Max wheelchair distance: 200 Assist Level: Dependent (Pt equals 0%)  Cognition Comprehension Comprehension assist level: Follows basic conversation/direction with extra time/assistive device  Expression Expression assist level: Expresses basic needs/ideas: With no assist  Social Interaction Social Interaction assist level: Interacts appropriately 90% of the time - Needs monitoring or encouragement for participation or interaction.  Problem Solving Problem solving assist level: Solves basic 75 - 89% of the time/requires cueing 10 - 24% of the time  Memory Memory assist level: Recognizes or recalls 90% of the time/requires cueing < 10% of the time     Medical Problem List and Plan: 1. 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 Follow-up per radiation oncology Dr. Patric Dykes and oncology services Dr. Burr Medico admitted on 3/15.  Continue decadron 9 mg bid chronically until seen by Oncologist S/p stereotactic treatment on 09/18/15  Per Rad/Onc, pt to follow up in ~4/16 2. PE: Xarelto 3.  Pain Management: Neurontin 300 mg 3 times a day, baclofen 5 mg 3 times a day. 4. Constipation. Laxative assistance 5. Neuropsych: This patient is capable of making decisions on her own behalf. 6. Skin/Wound Care: Routine skin checks 7. Fluids/Electrolytes/Nutrition: Routine I&O 8. Hypercalcemia: Will consider IV pamidronate if calcium above 11 next week  Ca++ 11.8 on 3/16 9. Anemia: Cont to monitor, consider transfusion if necessary.  Hb 12.0 on 3/16 10. Tachycardia: Continue to monitor with increased activity. Consider medications if necessary 11.  HTN  Overall well controlled without medications  Will cont to monitor 12. Recurrent aphthous stomatitis  Fluocinonide QID until ulcer healed  LOS (Days) 2 A FACE TO FACE EVALUATION WAS PERFORMED  Crue Otero Lorie Phenix 09/19/2015 10:08 AM

## 2015-09-19 NOTE — Progress Notes (Signed)
Pt saw me in the gym and asked me to see her. She states she has discussed with her husband her medical findings this admission. States her youngest daughter last night felt also that there should be full disclosure to her spouse. [id number]

## 2015-09-19 NOTE — Progress Notes (Signed)
Occupational Therapy Session Note  Patient Details  Name: [name] [name] MRN: [id number] Date of Birth: June 22, 1951  Today's Date: 09/19/2015 OT Individual Time: 1300-1330 OT Individual Time Calculation (min): 30 min    Short Term Goals: Week 1:  OT Short Term Goal 1 (Week 1): Pt will maintaining standing balance with even weight distribution through LEs during a functional task with min A OT Short Term Goal 2 (Week 1): Pt will setup w/c in prep for transfer with mod VC (supervision) OT Short Term Goal 3 (Week 1): Pt will perform 3/3 toileting tasks with close supervision OT Short Term Goal 4 (Week 1): Pt will use right Ue at dominant level witih mod VC throughout a 60 min session  Skilled Therapeutic Interventions/Progress Updates:    1:1 Pt finishing lunch when arrived. Pt eating with her dominant hand with extra time but continued to bump into her cup placed on her right with decr awareness. Pt required VC to problem solve to move cup rather than continue to bump into cup multiple times with arm.  Transitioned down to the gym and squat pivot to mat with min A. Focus on controlled squat pivots and management of LEs down the EOM towards the right. Pt required max verbal adn tactile cues for positioning of right LE and to maintain a forward weight shift. Focused on sit to stands and static standing balance with attention to right LE sustained activation with max verbal and tactile cues. Yoga blocked placed in between LEs to maintain a normal BOS (pt tends to bring left LE into midline and decr weight bearing through right LE). Transitioned into supine with max Vc for sequencing for normal patterns of movement. Focus on core strengthening and right LE activation with bridging while maintaining a ball in between LEs. When transitioning back into w/c (stand pivot) pt impulsive not demonstrating safety requiring total cuing to stop and follow motor directions to safely get into chair.   Therapy  Documentation Precautions:  Precautions Precautions: Fall Precaution Comments: right sided weakness and dyscoordination Required Braces or Orthoses: Knee Immobilizer - Right Knee Immobilizer - Right:  (when walking) Restrictions Weight Bearing Restrictions: No Pain:  no c/o pain in session  ADL: ADL ADL Comments: see functional navigator  See Function Navigator for Current Functional Status.   Therapy/Group: Individual Therapy  Willeen Cass [facility] 09/19/2015, 1:49 PM

## 2015-09-19 NOTE — Progress Notes (Signed)
Physical Therapy Session Note  Patient Details  Name: [name] [name] MRN: [id number] Date of Birth: 01-Feb-1951  Today's Date: 09/19/2015 PT Individual Time: 1015-1116 PT Individual Time Calculation (min): 61 min   Short Term Goals: Week 1:  PT Short Term Goal 1 (Week 1): =LTGs due to ELOS  Skilled Therapeutic Interventions/Progress Updates:    Pt received resting in bed and agreeable to therapy session, no c/o pain.  Session focus on LE strengthening, endurance, w/c mobility, and transfers.  Dynamic sitting balance EOB to don shoes with supervision and increased time.  Stand/pivot from EOB to w/c with mod assist to balance once upright.  PT instructed pt in w/c propulsion using BUEs and LLE with min/mod assist for navigation, transitioned to L hemi technique for w/c propulsion with initial min assist fade to supervision with practice.  Stand/pivot from w/c>therapy mat mod assist for balance and safety due to impulsivity and poor motor planning.  5xSS in 29.17 seconds with therapist blocking R knee from buckling and verbal cues for forward weight shift and no use of UEs.  Repeated sit<>stand x18 with focus on functional task (matching cards), forward weight shift and not using LEs to push against mat to power up.  Therapist provided min>mod assist for R knee control and forward weight shift.  Pt demo's significant difficulty with card matching task, unable to match any cards correctly without total multimodal cuing and unable to recognize errors or self correct without total cuing.  PT instructed pt in LE therex 3x10 reps with L 2# ankle weight, R no weight for hip flexion and LAQ.  Verbal cues for correct technique.  Stand/pivot back to w/c with min assist for controlled movement and pt propelled back to room with L hemi technique, supervision.  Pt positioned in w/c in room with call bell in reach and needs met.    Therapy Documentation Precautions:  Precautions Precautions: Fall Precaution  Comments: right sided weakness and dyscoordination Required Braces or Orthoses: Knee Immobilizer - Right Knee Immobilizer - Right:  (when walking) Restrictions Weight Bearing Restrictions: No   See Function Navigator for Current Functional Status.   Therapy/Group: Individual Therapy  Earnest Conroy [place]Crew 09/19/2015, 12:14 PM

## 2015-09-20 ENCOUNTER — Inpatient Hospital Stay (HOSPITAL_COMMUNITY): Payer: Medicaid Other | Admitting: Occupational Therapy

## 2015-09-20 ENCOUNTER — Inpatient Hospital Stay (HOSPITAL_COMMUNITY): Payer: Medicaid Other | Admitting: Speech Pathology

## 2015-09-20 ENCOUNTER — Inpatient Hospital Stay (HOSPITAL_COMMUNITY): Payer: Medicaid Other | Admitting: Physical Therapy

## 2015-09-20 NOTE — Progress Notes (Signed)
[facility] Individual Statement of Services  Patient Name:  [name] [name]  Date:  09/20/2015  Welcome to the [facility].  Our goal is to provide you with an individualized program based on your diagnosis and situation, designed to meet your specific needs.  With this comprehensive rehabilitation program, you will be expected to participate in at least 3 hours of rehabilitation therapies Monday-Friday, with modified therapy programming on the weekends.  Your rehabilitation program will include the following services:  Physical Therapy (PT), Occupational Therapy (OT), Speech Therapy (ST), 24 hour per day rehabilitation nursing, Neuropsychology, Case Management (Social Worker), Rehabilitation Medicine, Nutrition Services and Pharmacy Services  Weekly team conferences will be held on Wednesdays to discuss your progress.  Your Social Worker will talk with you frequently to get your input and to update you on team discussions.  Team conferences with you and your family in attendance may also be held.  Expected length of stay: 10 to 12 days  Overall anticipated outcome: supervision from w/c, with minimal assistance with ambulation, showering, car transfers  Depending on your progress and recovery, your program may change. Your Social Worker will coordinate services and will keep you informed of any changes. Your Social Worker's name and contact numbers are listed  below.  The following services may also be recommended but are not provided by the [facility] will be made to provide these services after discharge if needed.  Arrangements include referral to agencies that provide these services.  Your insurance has been verified to be:  Medicaid Your primary doctor is:  Dr. Lin Landsman  Pertinent information will be shared  with your doctor and your insurance company.  Social Worker:  Alfonse Alpers, LCSW  [phone] or [phone]  Information discussed with and copy given to patient by: Trey Sailors, 09/20/2015, 11:53 PM

## 2015-09-20 NOTE — Progress Notes (Signed)
Speech Language Pathology Daily Session Note  Patient Details  Name: VAUNA JUNGWIRTH MRN: [id number] Date of Birth: April 15, 1951  Today's Date: 09/20/2015 SLP Individual Time: 1045-1130 SLP Individual Time Calculation (min): 45 min  Short Term Goals: Week 1: SLP Short Term Goal 1 (Week 1): Patient will demonstrate functional problem solving for familiar tasks with Min A verbal and question cues.  SLP Short Term Goal 2 (Week 1): Patient will demonstrate selective attention to functional tasks in a mildly distracting enviornment with Min A verbal cues for redirection for 30 minutes.  SLP Short Term Goal 3 (Week 1): Patient will attend to RUE and RLE during functional tasks with Min A verbal and question cues.  SLP Short Term Goal 4 (Week 1): Patient will utilize external memory aids to recall new, daily information with Min A verbal and question cues.   Skilled Therapeutic Interventions: Pt participated in SLP therapy focusing on cognitive goals. Pt completed subtests of the ALFA with the following results: Telling time 9/10 at min A- mod I for correction of initial errors; Counting money 7/10 at mod I, min A for organization for remaining items, Addressing an Envelope 10/10, and calendar use 10/10. Mod A for writing a check.    Function:  Eating Eating   Modified Consistency Diet: No Eating Assist Level: No help, No cues           Cognition Comprehension Comprehension assist level: Follows basic conversation/direction with no assist  Expression   Expression assist level: Expresses basic needs/ideas: With no assist  Social Interaction Social Interaction assist level: Interacts appropriately 90% of the time - Needs monitoring or encouragement for participation or interaction.  Problem Solving Problem solving assist level: Solves basic 50 - 74% of the time/requires cueing 25 - 49% of the time  Memory Memory assist level: Recognizes or recalls 75 - 89% of the time/requires cueing 10 - 24%  of the time    Pain Pain Assessment Pain Assessment: No/denies pain Pain Score: 0-No pain  Therapy/Group: Individual Therapy  Vinetta Bergamo 09/20/2015, 5:15 PM

## 2015-09-20 NOTE — Progress Notes (Addendum)
Physical Therapy Session Note  Patient Details  Name: [name] [name] MRN: [id number] Date of Birth: [birth year]  Today's Date: 09/20/2015 PT Individual Time: 1300-1415 PT Individual Time Calculation (min): 75 min   Short Term Goals: Week 1:  PT Short Term Goal 1 (Week 1): =LTGs due to ELOS  Skilled Therapeutic Interventions/Progress Updates:  Pt was seen bedside in the pm sitting up in w/c. Pt propelled w/c about 200 feet with B UEs and L LE with S and verbal cues with increased time. Treatment in gym focused on LE strengthening. Treatment included hip flex and LAQs, 3 sets x 15 reps each with 2 lbs on L LE and 1 lb on R LE. Quad sets R LE only 3 sets x 15 reps each. Pt performed mini squats 3 sets x 10 reps each and arm chair push ups 3 sets x 10 reps each. Pt propelled w/c back towards room about 100 feet with B UEs and L LE with S and verbal cues required increased time. Pt returned to room and left sitting up in w/c with call bell within reach.   Therapy Documentation Precautions:  Precautions Precautions: Fall Precaution Comments: right sided weakness and dyscoordination Required Braces or Orthoses: Knee Immobilizer - Right Knee Immobilizer - Right:  (when walking) Restrictions Weight Bearing Restrictions: No General:   Pain: Pt c/o pain L groin 4/10.   See Function Navigator for Current Functional Status.   Therapy/Group: Individual Therapy  Dub Amis 09/20/2015, 3:48 PM

## 2015-09-20 NOTE — Progress Notes (Signed)
Occupational Therapy Session Note  Patient Details  Name: [name] [name] MRN: [id number] Date of Birth: Nov 26, 1950  Today's Date: 09/20/2015 OT Individual Time:  - 0800-0900  (60 mins)      Short Term Goals: Week 1:  OT Short Term Goal 1 (Week 1): Pt will maintaining standing balance with even weight distribution through LEs during a functional task with min A OT Short Term Goal 2 (Week 1): Pt will setup w/c in prep for transfer with mod VC (supervision) OT Short Term Goal 3 (Week 1): Pt will perform 3/3 toileting tasks with close supervision OT Short Term Goal 4 (Week 1): Pt will use right Ue at dominant level witih mod VC throughout a 60 min session Week 2:    Week 3:     Skilled Therapeutic Interventions/Progress Updates:  .   Addressed dynamic sitting balance, cognition, RUE NMRE.   EOB to don shoes with supervision.  Ppt engaged in standing balance at the sink for 3 minutes while combing hair.  Transitioned from standing to wc.  Ate breakfast using R hand and drank from cup.  Had difficulty with control of opening packages and spillage.  Engaged in problem solving with simple math, ie grocery bills.  Ppt able to perform problem with pencil and paper using right UE to write with.  Provide pt with writing exercises for homework to do when not in therapy.   Pt. Left with call bell, phone and all needs in reach.    Therapy Documentation Precautions:  Precautions Precautions: Fall Precaution Comments: right sided weakness and dyscoordination Required Braces or Orthoses: Knee Immobilizer - Right Knee Immobilizer - Right:  (when walking) Restrictions Weight Bearing Restrictions: No      Pain: Pain Assessment Pain Assessment: 0-10 Pain Score: 0-No pain ADL: ADL ADL Comments: see functional navigator   See Function Navigator for Current Functional Status.   Therapy/Group: Individual Therapy  Lisa Roca 09/20/2015, 7:51 AM

## 2015-09-20 NOTE — Progress Notes (Signed)
[facility] PHYSICAL MEDICINE & REHABILITATION     PROGRESS NOTE  Subjective/Complaints:  No complaints. Up with therapy. Feeling well.   ROS: Denies CP, SOB, n/v/d.  Objective: Vital Signs: Blood pressure 111/60, pulse 96, temperature 97.7 F (36.5 C), temperature source Oral, resp. rate 18, SpO2 98 %. No results found.  Recent Labs  09/18/15 0545  WBC 8.9  HGB 12.0  HCT 35.9*  PLT 384    Recent Labs  09/18/15 0545  NA 139  K 4.5  CL 106  GLUCOSE 129*  BUN 19  CREATININE 0.65  CALCIUM 11.8*   CBG (last 3)  No results for input(s): GLUCAP in the last 72 hours.  Wt Readings from Last 3 Encounters:  09/13/15 67.132 kg (148 lb)  08/01/15 70.489 kg (155 lb 6.4 oz)  06/20/15 70.761 kg (156 lb)    Physical Exam:  BP 111/60 mmHg  Pulse 96  Temp(Src) 97.7 F (36.5 C) (Oral)  Resp 18  SpO2 98%  Constitutional: She appears well-developed. NAD. Vital signs reviewed.  HENT: Normocephalic and atraumatic.  Mouth: Oral ulcers Eyes: Conjunctivae and EOM are normal.  Cardiovascular: Normal rate and regular rhythm.No murmur heard. Respiratory: Effort normal and breath sounds normal. No stridor. No respiratory distress. She has no wheezes.  GI: Soft. Bowel sounds are normal. She exhibits no distension. There is no tenderness.  Musculoskeletal: She exhibits no edema or tenderness.  Neurological: She is alert and oriented. Speech clear.  Follows commands without difficulty.  Motor LUE/LLE 5/5.   RUE: deltoids 4+/5, biceps 4+/5, triceps 4+/5, intrinsics 4+/5.  RLE: HF 4-/5, KE 4-/5, PF/DF 2/5.  Skin: Skin is warm and dry.  Psychiatric: She has a normal mood and affect. Her behavior is normal. Judgment and thought content normal  Assessment/Plan: 1. 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 Physiatrist is providing close team  supervision and 24 hour management of active medical problems listed below. Physiatrist and rehab team continue to assess barriers to discharge/monitor patient progress toward functional and medical goals.  Function:  Bathing Bathing position   Position: Shower  Bathing parts Body parts bathed by patient: Right arm, Left arm, Abdomen, Chest, Front perineal area, Buttocks, Right upper leg, Left upper leg, Back Body parts bathed by helper: Right lower leg, Left lower leg  Bathing assist Assist Level:  (pt reported bathing all parts at bed level before tx- didnt wash feet )      Upper Body Dressing/Undressing Upper body dressing   What is the patient wearing?: Bra, Pull over shirt/dress Bra - Perfomed by patient: Thread/unthread right bra strap, Thread/unthread left bra strap, Hook/unhook bra (pull down sports bra)   Pull over shirt/dress - Perfomed by patient: Thread/unthread right sleeve, Thread/unthread left sleeve, Put head through opening, Pull shirt over trunk          Upper body assist Assist Level: Set up   Set up : To obtain clothing/put away  Lower Body Dressing/Undressing Lower body dressing   What is the patient wearing?: Underwear, Pants, [employer], Shoes Underwear - Performed by patient: Thread/unthread left underwear leg, Pull underwear up/down Underwear - Performed by helper: Thread/unthread right underwear leg Pants- Performed by patient: Thread/unthread left pants leg, Pull pants up/down, Thread/unthread right pants leg   Non-skid slipper socks- Performed by patient: Don/doff left sock Non-skid slipper socks- Performed by helper: Don/doff right sock  Shoes - Performed by patient: Don/doff left shoe, Fasten right, Fasten left Shoes - Performed by helper: Don/doff right shoe       TED Hose - Performed by helper: Don/doff right TED hose, Don/doff left TED hose  Lower body assist Assist for lower body dressing: Touching or steadying assistance (Pt > 75%)       Toileting Toileting   Toileting steps completed by patient: Performs perineal hygiene, Adjust clothing after toileting Toileting steps completed by helper: Adjust clothing prior to toileting    Toileting assist Assist level: Touching or steadying assistance (Pt.75%)   Transfers Chair/bed transfer   Chair/bed transfer method: Squat pivot, Stand pivot Chair/bed transfer assist level: Touching or steadying assistance (Pt > 75%) Chair/bed transfer assistive device: Armrests, Medical sales representative     Max distance: 87 Assist level: Moderate assist (Pt 50 - 74%)   Wheelchair   Type: Manual Max wheelchair distance: 250 Assist Level: Touching or steadying assistance (Pt > 75%)  Cognition Comprehension Comprehension assist level: Follows complex conversation/direction with extra time/assistive device  Expression Expression assist level: Expresses complex ideas: With no assist  Social Interaction Social Interaction assist level: Interacts appropriately 90% of the time - Needs monitoring or encouragement for participation or interaction.  Problem Solving Problem solving assist level: Solves basic 50 - 74% of the time/requires cueing 25 - 49% of the time  Memory Memory assist level: Recognizes or recalls 50 - 74% of the time/requires cueing 25 - 49% of the time     Medical Problem List and Plan: 1. 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 Follow-up per radiation oncology Dr. Patric Dykes and oncology services Dr. Burr Medico admitted on 3/15.  Continue decadron 9 mg bid chronically until seen by Oncologist S/p stereotactic treatment on 09/18/15  Per Rad/Onc, pt to follow up in ~4/16 2. PE: Xarelto 3. Pain Management: Neurontin 300 mg 3 times a day, baclofen 5 mg 3 times a day. 4. Constipation. Laxative assistance 5. Neuropsych: This patient is capable of making decisions on her own behalf. 6. Skin/Wound  Care: Routine skin checks 7. Fluids/Electrolytes/Nutrition: Routine I&O 8. Hypercalcemia: Will consider IV pamidronate if calcium above 11 next week  Ca++ 11.8 on 3/16 9. Anemia: Cont to monitor, consider transfusion if necessary.  Hb 12.0 on 3/16 10. Tachycardia: Continue to monitor with increased activity. Consider medications if necessary 11.  HTN  Overall well controlled without medications  Will cont to monitor 12. Recurrent aphthous stomatitis  Fluocinonide QID until ulcer healed  LOS (Days) 3 A FACE TO FACE EVALUATION WAS PERFORMED  Kimmi Acocella T 09/20/2015 12:39 PM

## 2015-09-21 ENCOUNTER — Other Ambulatory Visit: Payer: Medicaid Other

## 2015-09-21 LAB — PTH-RELATED PEPTIDE: PTH-related peptide: <1.1 pmol/L

## 2015-09-21 NOTE — Progress Notes (Signed)
[facility] PHYSICAL MEDICINE & REHABILITATION     PROGRESS NOTE  Subjective/Complaints:  Eating breakfast. No complaints. Anxious to get stronger.   ROS: Denies CP, SOB, n/v/d.  Objective: Vital Signs: Blood pressure 109/53, pulse 58, temperature 97.9 F (36.6 C), temperature source Oral, resp. rate 18, SpO2 99 %. No results found. No results for input(s): WBC, HGB, HCT, PLT in the last 72 hours. No results for input(s): NA, K, CL, GLUCOSE, BUN, CREATININE, CALCIUM in the last 72 hours.  Invalid input(s): CO CBG (last 3)  No results for input(s): GLUCAP in the last 72 hours.  Wt Readings from Last 3 Encounters:  09/13/15 67.132 kg (148 lb)  08/01/15 70.489 kg (155 lb 6.4 oz)  06/20/15 70.761 kg (156 lb)    Physical Exam:  BP 109/53 mmHg  Pulse 58  Temp(Src) 97.9 F (36.6 C) (Oral)  Resp 18  SpO2 99%  Constitutional: She appears well-developed. NAD. Vital signs reviewed.  HENT: Normocephalic and atraumatic.  Mouth: Oral ulcers Eyes: Conjunctivae and EOM are normal.  Cardiovascular: Normal rate and regular rhythm.No murmur heard. Respiratory: Effort normal and breath sounds normal. No stridor. No respiratory distress. She has no wheezes.  GI: Soft. Bowel sounds are normal. She exhibits no distension. There is no tenderness.  Musculoskeletal: She exhibits no edema or tenderness.  Neurological: She is alert and oriented. Speech clear.  Follows commands without difficulty.  Motor LUE/LLE 5/5.   RUE: deltoids 4+/5, biceps 4+/5, triceps 4+/5, intrinsics 4+/5.  RLE: HF 4-/5, KE 4-/5, PF/DF 2/5.  Skin: Skin is warm and dry.  Psychiatric: She has a normal mood and affect. Her behavior is normal. Judgment and thought content normal  Assessment/Plan: 1. 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 Physiatrist is providing close team  supervision and 24 hour management of active medical problems listed below. Physiatrist and rehab team continue to assess barriers to discharge/monitor patient progress toward functional and medical goals.  Function:  Bathing Bathing position   Position: Shower  Bathing parts Body parts bathed by patient: Right arm, Left arm, Abdomen, Chest, Front perineal area, Buttocks, Right upper leg, Left upper leg, Back Body parts bathed by helper: Right lower leg, Left lower leg  Bathing assist Assist Level:  (pt reported bathing all parts at bed level before tx- didnt wash feet )      Upper Body Dressing/Undressing Upper body dressing   What is the patient wearing?: Bra, Pull over shirt/dress Bra - Perfomed by patient: Thread/unthread right bra strap, Thread/unthread left bra strap, Hook/unhook bra (pull down sports bra)   Pull over shirt/dress - Perfomed by patient: Thread/unthread right sleeve, Thread/unthread left sleeve, Put head through opening, Pull shirt over trunk          Upper body assist Assist Level: Set up   Set up : To obtain clothing/put away  Lower Body Dressing/Undressing Lower body dressing   What is the patient wearing?: Underwear, Pants, [employer], Shoes Underwear - Performed by patient: Thread/unthread left underwear leg, Pull underwear up/down Underwear - Performed by helper: Thread/unthread right underwear leg Pants- Performed by patient: Thread/unthread left pants leg, Pull pants up/down, Thread/unthread right pants leg   Non-skid slipper socks- Performed by patient: Don/doff left sock Non-skid slipper socks- Performed by helper: Don/doff right sock     Shoes - Performed by patient: Don/doff left shoe, Fasten right, Fasten left Shoes -  Performed by helper: Don/doff right shoe       TED Hose - Performed by helper: Don/doff right TED hose, Don/doff left TED hose  Lower body assist Assist for lower body dressing: Touching or steadying assistance (Pt > 75%)       Toileting Toileting   Toileting steps completed by patient: Adjust clothing prior to toileting, Performs perineal hygiene, Adjust clothing after toileting Toileting steps completed by helper: Adjust clothing prior to toileting    Toileting assist Assist level: Supervision or verbal cues   Transfers Chair/bed transfer   Chair/bed transfer method: Squat pivot, Stand pivot Chair/bed transfer assist level: Touching or steadying assistance (Pt > 75%) Chair/bed transfer assistive device: Armrests     Locomotion Ambulation     Max distance: 87 Assist level: Moderate assist (Pt 50 - 74%)   Wheelchair   Type: Manual Max wheelchair distance: 200 Assist Level: Supervision or verbal cues  Cognition Comprehension Comprehension assist level: Understands basic 90% of the time/cues < 10% of the time  Expression Expression assist level: Expresses basic 90% of the time/requires cueing < 10% of the time.  Social Interaction Social Interaction assist level: Interacts appropriately 90% of the time - Needs monitoring or encouragement for participation or interaction.  Problem Solving Problem solving assist level: Solves basic 90% of the time/requires cueing < 10% of the time  Memory Memory assist level: Recognizes or recalls 90% of the time/requires cueing < 10% of the time     Medical Problem List and Plan: 1. 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 Follow-up per radiation oncology Dr. Patric Dykes and oncology services Dr. Burr Medico admitted on 3/15.  Continue decadron 9 mg bid chronically until seen by Oncologist S/p stereotactic treatment on 09/18/15  Per Rad/Onc, pt to follow up in ~4/16 2. PE: Xarelto 3. Pain Management: Neurontin 300 mg 3 times a day, baclofen 5 mg 3 times a day. 4. Constipation. Laxative assistance 5. Neuropsych: This patient is capable of making decisions on her own behalf. 6. Skin/Wound Care:  Routine skin checks 7. Fluids/Electrolytes/Nutrition: Routine I&O 8. Hypercalcemia: Will consider IV pamidronate if calcium above 11 next week  Ca++ 11.8 on 3/16 9. Anemia: Cont to monitor, consider transfusion if necessary.  Hb 12.0 on 3/16 10. Tachycardia: Continue to monitor with increased activity. Consider medications if necessary 11.  HTN  Overall well controlled without medications  Will cont to monitor 12. Recurrent aphthous stomatitis  Fluocinonide QID until ulcer healed  LOS (Days) 4 A FACE TO FACE EVALUATION WAS PERFORMED  Tiki Tucciarone T 09/21/2015 11:23 AM

## 2015-09-21 NOTE — Progress Notes (Signed)
Social Work Assessment and Plan  Patient Details  Name: [name] [name] MRN: [id number] Date of Birth: Jun 20, 1951  Today's Date: 09/21/2015  Problem List:  Patient Active Problem List   Diagnosis Date Noted  . Recurrent aphthous stomatitis   . Benign essential HTN   . Abnormality of gait   . Hemiparesis ([place])   . Acute septic pulmonary embolism without acute cor pulmonale ([facility])   . Anemia of chronic disease   . Chronic pulmonary embolism ([place])   . Ataxia   . Metastatic colon cancer to liver ([place])   . Brain metastases ([place]) 09/13/2015  . Right leg weakness 09/13/2015  . Brain metastasis ([place]) 09/13/2015  . Postoperative anemia due to acute blood loss 03/12/2015  . Long-term (current) use of anticoagulants 03/03/2015  . 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 Pulmonary embolism ([place]) 11/14/2014  . 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 Anxiety   . Upper abdominal pain   . Nausea with vomiting 06/12/2014  . 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 Colorectal cancer, stage IV, obstructing s/p stent x2, ostomy, then LAR 03/06/2015 06/05/2013  . Family history of colorectal cancer 06/05/2013  . Thrush, oral 06/05/2013  . Tachycardia 06/05/2013  . Hypercalcemia 05/24/2013   Past Medical History:  Past Medical History  Diagnosis Date  . Colon cancer ([place])     dx'd 2014  . Heart murmur     ? as a child   . Pulmonary embolism ([place]) 11/2014   . Anemia     hx of    Past Surgical History:  Past Surgical History  Procedure Laterality Date  . Abdominal hysterectomy  2005  . Cesarean section  1976  . Flexible sigmoidoscopy N/A 05/25/2013    Procedure: FLEXIBLE SIGMOIDOSCOPY;  Surgeon: [name], MD;  Location: [facility];  Service: Endoscopy;  Laterality: N/A;  needs floro  . Colonic stent placement N/A 05/25/2013    Procedure: COLONIC STENT PLACEMENT;  Surgeon: [name], MD;  Location: [facility];  Service: Endoscopy;  Laterality: N/A;  . Portacath  placement Right 06/25/2013    Procedure: ULTRA SOUND GUIDED INSERTION PORT-A-CATH;  Surgeon: [name], MD;  Location: [facility];  Service: General;  Laterality: Right;  . Flexible sigmoidoscopy N/A 08/19/2014    Procedure: FLEXIBLE SIGMOIDOSCOPY;  Surgeon: Gatha Mayer, MD;  Location: [facility];  Service: Endoscopy;  Laterality: N/A;  . Colonic stent placement N/A 08/19/2014    Procedure: COLONIC STENT PLACEMENT;  Surgeon: Gatha Mayer, MD;  Location: [facility];  Service: Endoscopy;  Laterality: N/A;  . Flexible sigmoidoscopy N/A 08/19/2014    Procedure: FLEXIBLE SIGMOIDOSCOPY;  Surgeon: Gatha Mayer, MD;  Location: [facility];  Service: Endoscopy;  Laterality: N/A;  . Colonic stent placement N/A 08/19/2014    Procedure: COLONIC STENT PLACEMENT;  Surgeon: Gatha Mayer, MD;  Location: [facility];  Service: Endoscopy;  Laterality: N/A;  . Colon resection N/A 10/28/2014    Procedure: Transverse loop colostomy;  Surgeon: [name], MD;  Location: [facility];  Service: General;  Laterality: N/A;  . Colostomy revision N/A 01/03/2015    Procedure: REVISION OF TRANSVERSE LOOP COLOSTOMY WITH PARTIAL  COLECTOMY;  Surgeon: [name], MD;  Location: [facility];  Service: General;  Laterality: N/A;  . Laparoscopic low anterior resection  03/06/2015    03/06/2015  . Laparoscopic splenectomy  03/06/2015  . Colon resection N/A 03/06/2015    Procedure: LAPAROSCOPIC takedown loop colostomy ;  Surgeon: Remo Lipps  Gross, MD;  Location: [facility];  Service: General;  Laterality: N/A;  . 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 Bowel resection  03/06/2015    Procedure: LOW ANTERIOR BOWEL RESECTION, rigid proctoscopy;  Surgeon: Michael Boston, MD;  Location: [facility];  Service: General;;  . Polypectomy  03/06/2015    Procedure: transverse POLYPECTOMY x 4;  Surgeon: Michael Boston, MD;  Location:  [facility];  Service: General;;   Social History:  reports that she quit smoking about 44 years ago. Her smoking use included Cigarettes. She quit after 1 year of use. She has never used smokeless tobacco. She reports that she does not drink alcohol or use illicit drugs.  Family / Support Systems Marital Status: Married How Long?: 20 years Patient Roles: Spouse, Parent, Other (Comment) (child) Spouse/Significant Other: Mady Oubre - husband and pastor - [phone] Children: Aleen Sells - dtr - [phone]    Astrid Drafts - dtr - [phone] Other Supports: mother Anticipated Caregiver: pt's Mom , daughters and family Ability/Limitations of Caregiver: spouse works days Caregiver Availability: 24/7 Family Dynamics: supportive family.  Pt has not told husband the extent of her cancer, as pt's husband has a mouth surgery coming up and she does not want to cause him more stress/worry.  Social History Preferred language: English Religion: Non-Denominational Read: Yes Write: Yes Employment Status: Retired Date Retired/Disabled/Unemployed: 2015 Age Retired: 47 Public relations account executive Issues: none reported Guardian/Conservator: none reported   Abuse/Neglect Physical Abuse: Denies Verbal Abuse: Denies Sexual Abuse: Denies Exploitation of patient/patient's resources: Denies Self-Neglect: Denies  Emotional Status Pt's affect, behavior and adjustment status: Pt is very positive and has a strong faith and belief that God is going to take are of her and she will be healed. Recent Psychosocial Issues: Pt's husband with upcoming mouth surgery. Psychiatric History: none reported Substance Abuse History: none reported  Patient / Family Perceptions, Expectations & Goals Pt/Family understanding of illness & functional limitations: Pt has a good understanding of her condition, but has not fully disclosed to her husband.  She is feeling she will be healed. Premorbid pt/family  roles/activities: Pt enjoys helping others and ministering to people.  She believes it is her purpose to lift others up. Anticipated changes in roles/activities/participation: Pt hopes to continue to do this. Pt/family expectations/goals: Pt wants to be on CIR for however long she needs to be here to be safe when she goes home.  Community Resources Express Scripts: None Premorbid Home Care/DME Agencies: None (Pt has a single point cane) Transportation available at discharge: family Resource referrals recommended: Neuropsychology, Support group (specify)  Discharge Planning Living Arrangements: Spouse/significant other Support Systems: Spouse/significant other, Children, Parent, Other relatives, Friends/neighbors, Social worker community Type of Residence: Private residence Insurance Resources: Medicaid (specify county) Sports coach) Museum/gallery curator Resources: SSD, Family Support (Husband is working and CSW to verify pt receives [employer].) Financial Screen Referred: No Money Management: Patient, Spouse Does the patient have any problems obtaining your medications?: No Home Management: Pt's husband and other family members will take care of this. Patient/Family Preliminary Plans: Pt plans to go to her home and her adult children, mother, and other family members will be with her when her husband is working. Barriers to Discharge: Steps, Self care Social Work Anticipated Follow Up Needs: HH/OP, Support Group Expected length of stay: 10 to 12 days  Clinical Impression CSW met with pt and her husband  to introduce self and role of CSW, as well as to complete assessment.  CSW noted in Admissions Coordinator's note that pt's husband is not fully aware of pt's condition per pt's request, so CSW did not talk specifics with pt about her condition with him present.  CSW will visit with pt at another time to check in with about this.  Pt reports she will have someone with her at home between her children, husband,  mother, and other family members.  CSW will have to talk with her further about this, as now CSW is aware of some of pt's goals being minimal assistance level.  Pt is a very positive, optimistic person and feels this will help her to get better.  She believes God will heal her and that she needs to keep the faith about this.  CSW will continue to follow pt and assist as needed.  Connelly Spruell, [name] 09/21/2015, 12:20 AM

## 2015-09-22 ENCOUNTER — Inpatient Hospital Stay (HOSPITAL_COMMUNITY): Payer: Medicaid Other | Admitting: Occupational Therapy

## 2015-09-22 ENCOUNTER — Inpatient Hospital Stay (HOSPITAL_COMMUNITY): Payer: Medicaid Other | Admitting: Physical Therapy

## 2015-09-22 ENCOUNTER — Inpatient Hospital Stay (HOSPITAL_COMMUNITY): Payer: Medicaid Other | Admitting: Speech Pathology

## 2015-09-22 NOTE — Progress Notes (Signed)
Speech Language Pathology Daily Session Note  Patient Details  Name: [name] [name] MRN: [id number] Date of Birth: 1951/02/08  Today's Date: 09/22/2015 SLP Individual Time: 0800-0900 SLP Individual Time Calculation (min): 60 min  Short Term Goals: Week 1: SLP Short Term Goal 1 (Week 1): Patient will demonstrate functional problem solving for familiar tasks with Min A verbal and question cues.  SLP Short Term Goal 2 (Week 1): Patient will demonstrate selective attention to functional tasks in a mildly distracting enviornment with Min A verbal cues for redirection for 30 minutes.  SLP Short Term Goal 3 (Week 1): Patient will attend to RUE and RLE during functional tasks with Min A verbal and question cues.  SLP Short Term Goal 4 (Week 1): Patient will utilize external memory aids to recall new, daily information with Min A verbal and question cues.   Skilled Therapeutic Interventions: Skilled treatment session focused on cognitive goals. SLP facilitated session by providing supervision verbal cues for patient to attend to RUE during tray set-up and self-feeding with her breakfast meal. SLP also facilitated session by providing supervision verbal cues for patient to complete basic medication management tasks and answer mildly complex reading instructions with 100% accuracy. Patient left upright in wheelchair with all needs within reach. Continue with current plan of care.    Function:  Cognition Comprehension Comprehension assist level: Understands complex 90% of the time/cues 10% of the time  Expression   Expression assist level: Expresses complex 90% of the time/cues < 10% of the time  Social Interaction Social Interaction assist level: Interacts appropriately with others with medication or extra time (anti-anxiety, antidepressant).  Problem Solving Problem solving assist level: Solves complex 90% of the time/cues < 10% of the time  Memory Memory assist level: Recognizes or recalls 90% of  the time/requires cueing < 10% of the time    Pain Pain Assessment Pain Assessment: No/denies pain   Therapy/Group: Individual Therapy  [name] [name] 09/22/2015, 4:19 PM

## 2015-09-22 NOTE — Progress Notes (Signed)
[facility] PHYSICAL MEDICINE & REHABILITATION     PROGRESS NOTE  Subjective/Complaints:  Patient sitting up in her chair this morning. She had a good weekend and is pleased with the fact that she continues to get stronger.  ROS: Denies CP, SOB, n/v/d.  Objective: Vital Signs: Blood pressure 119/52, pulse 57, temperature 97.7 F (36.5 C), temperature source Oral, resp. rate 18, SpO2 98 %. No results found. No results for input(s): WBC, HGB, HCT, PLT in the last 72 hours. No results for input(s): NA, K, CL, GLUCOSE, BUN, CREATININE, CALCIUM in the last 72 hours.  Invalid input(s): CO CBG (last 3)  No results for input(s): GLUCAP in the last 72 hours.  Wt Readings from Last 3 Encounters:  09/13/15 67.132 kg (148 lb)  08/01/15 70.489 kg (155 lb 6.4 oz)  06/20/15 70.761 kg (156 lb)    Physical Exam:  BP 119/52 mmHg  Pulse 57  Temp(Src) 97.7 F (36.5 C) (Oral)  Resp 18  SpO2 98%  Constitutional: She appears well-developed. NAD. Vital signs reviewed.  HENT: Normocephalic and atraumatic.  Mouth: Oral ulcers Eyes: Conjunctivae and EOM are normal.  Cardiovascular: Normal rate and regular rhythm.No murmur heard. Respiratory: Effort normal and breath sounds normal. No stridor. No respiratory distress. She has no wheezes.  GI: Soft. Bowel sounds are normal. She exhibits no distension. There is no tenderness.  Musculoskeletal: She exhibits no edema or tenderness.  Neurological: She is alert and oriented. Speech clear.  Follows commands without difficulty.  Motor LUE/LLE 5/5.   RUE: deltoids 4+/5, biceps 4+/5, triceps 4+/5, intrinsics 4+/5.  RLE: HF 4+/5, KE 4+/5, PF/DF 4-/5.  Skin: Skin is warm and dry.  Psychiatric: She has a normal mood and affect. Her behavior is normal. Judgment and thought content normal  Assessment/Plan: 1. 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 Physiatrist is providing close team supervision and 24 hour management of active medical problems listed below. Physiatrist and rehab team continue to assess barriers to discharge/monitor patient progress toward functional and medical goals.  Function:  Bathing Bathing position   Position: Shower  Bathing parts Body parts bathed by patient: Right arm, Left arm, Abdomen, Chest, Front perineal area, Buttocks, Right upper leg, Left upper leg, Back Body parts bathed by helper: Right lower leg, Left lower leg  Bathing assist Assist Level:  (pt reported bathing all parts at bed level before tx- didnt wash feet )      Upper Body Dressing/Undressing Upper body dressing   What is the patient wearing?: Bra, Pull over shirt/dress Bra - Perfomed by patient: Thread/unthread right bra strap, Thread/unthread left bra strap, Hook/unhook bra (pull down sports bra)   Pull over shirt/dress - Perfomed by patient: Thread/unthread right sleeve, Thread/unthread left sleeve, Put head through opening, Pull shirt over trunk          Upper body assist Assist Level: Set up   Set up : To obtain clothing/put away  Lower Body Dressing/Undressing Lower body dressing   What is the patient wearing?: Underwear, Pants, [employer], Shoes Underwear - Performed by patient: Thread/unthread left underwear leg, Pull underwear up/down Underwear - Performed by helper: Thread/unthread right underwear leg Pants- Performed by patient: Thread/unthread left pants leg, Pull pants up/down, Thread/unthread right pants leg   Non-skid slipper socks- Performed by patient: Don/doff left sock Non-skid slipper socks- Performed by helper: Don/doff right sock  Shoes - Performed by patient: Don/doff left shoe, Fasten right, Fasten left Shoes - Performed by helper: Don/doff right shoe       TED Hose - Performed by helper: Don/doff right TED hose, Don/doff left TED hose  Lower body assist Assist  for lower body dressing: Touching or steadying assistance (Pt > 75%)      Toileting Toileting   Toileting steps completed by patient: Adjust clothing prior to toileting, Performs perineal hygiene, Adjust clothing after toileting Toileting steps completed by helper: Adjust clothing prior to toileting    Toileting assist Assist level: Supervision or verbal cues   Transfers Chair/bed transfer   Chair/bed transfer method: Squat pivot, Stand pivot Chair/bed transfer assist level: Touching or steadying assistance (Pt > 75%) Chair/bed transfer assistive device: Armrests     Locomotion Ambulation     Max distance: 87 Assist level: Moderate assist (Pt 50 - 74%)   Wheelchair   Type: Manual Max wheelchair distance: 200 Assist Level: Supervision or verbal cues  Cognition Comprehension Comprehension assist level: Follows complex conversation/direction with no assist  Expression Expression assist level: Expresses complex ideas: With no assist  Social Interaction Social Interaction assist level: Interacts appropriately with others - No medications needed.  Problem Solving Problem solving assist level: Solves complex problems: Recognizes & self-corrects  Memory Memory assist level: Complete Independence: No helper     Medical Problem List and Plan: 1. 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 Follow-up per radiation oncology Dr. Patric Dykes and oncology services Dr. Burr Medico admitted on 3/15.  Continue decadron 9 mg bid chronically until seen by Oncologist S/p stereotactic treatment on 09/18/15  Per Rad/Onc, pt to follow up in ~4/16 2. PE: Xarelto 3. Pain Management: Neurontin 300 mg 3 times a day, baclofen 5 mg 3 times a day. 4. Constipation. Laxative assistance 5. Neuropsych: This patient is capable of making decisions on her own behalf. 6. Skin/Wound Care: Routine skin checks 7. Fluids/Electrolytes/Nutrition:  Routine I&O 8. Hypercalcemia: Will consider IV pamidronate if calcium above 11 this week  Ca++ 11.8 on 3/16  Will order labs for tomorrow 9. Anemia: Cont to monitor, consider transfusion if necessary.  Hb 12.0 on 3/16 10. Tachycardia: Continue to monitor with increased activity. Consider medications if necessary 11.  HTN  Overall well controlled without medications  Will cont to monitor 12. Recurrent aphthous stomatitis  Fluocinonide QID until ulcer healed  LOS (Days) 5 A FACE TO FACE EVALUATION WAS PERFORMED  Ankit Lorie Phenix 09/22/2015 9:25 AM

## 2015-09-22 NOTE — Progress Notes (Signed)
Occupational Therapy Session Note  Patient Details  Name: [name] [name] MRN: [id number] Date of Birth: 27-Apr-1951  Today's Date: 09/22/2015 OT Individual Time: [id number] OT Individual Time Calculation (min): 57 min    Short Term Goals: Week 1:  OT Short Term Goal 1 (Week 1): Pt will maintaining standing balance with even weight distribution through LEs during a functional task with min A OT Short Term Goal 2 (Week 1): Pt will setup w/c in prep for transfer with mod VC (supervision) OT Short Term Goal 3 (Week 1): Pt will perform 3/3 toileting tasks with close supervision OT Short Term Goal 4 (Week 1): Pt will use right Ue at dominant level witih mod VC throughout a 60 min session  Skilled Therapeutic Interventions/Progress Updates:    Upon entering the room, pt seated in wheelchair with no c/o pain this session. Pt reporting she washed and dressed today with assistance from NT for set up A. OT assisting pt with donning of R KI and explaining safety concerns with standing/ambulation without it on. Pt verbalized understanding but stating, "I just don't like that thing." Pt ambulated short distance from wheelchair >bathroom with steady assistance and verbal cues for RW advancement for safety. Pt having successful BM this session on commode and was able to perform clothing management and hygiene with min A for balance. Pt returning to sink to wash hands while standing with steady assistance for balance. Pt performed 3 sets of 10 wheelchair push ups for increased balance and strength. OT educating and demonstrating rapid alternating movements exercises with B  UES and then finger isolation exercises for R UE. Pt required min verbal cues for proper technique. Call bell and all needed items within reach upon exiting the room.   Therapy Documentation Precautions:  Precautions Precautions: Fall Precaution Comments: right sided weakness and dyscoordination Required Braces or Orthoses: Knee Immobilizer  - Right Knee Immobilizer - Right:  (when walking) Restrictions Weight Bearing Restrictions: No  ADL: ADL ADL Comments: see functional navigator  See Function Navigator for Current Functional Status.   Therapy/Group: Individual Therapy  Phineas Semen 09/22/2015, 10:44 AM

## 2015-09-22 NOTE — Progress Notes (Signed)
Physical Therapy Session Note  Patient Details  Name: [name] [name] MRN: [id number] Date of Birth: 10-09-1950  Today's Date: 09/22/2015 PT Individual Time: 1101-1200 PT Individual Time Calculation (min): 59 min   Short Term Goals: Week 1:  PT Short Term Goal 1 (Week 1): =LTGs due to ELOS  Skilled Therapeutic Interventions/Progress Updates:    Patient received sitting in [facility] and agreeable to PT. Pt performed WC mobility to rehab gym for 246f with BUE and BLE propulsion. WC<>bed transfer with minA and moderate cues for stepping pattern and AD management. .  Seated LE therex with level 2 tband: BLE HS curl x 10, BLE LAQ x 10, Hip flexion x 8  Supine LE therex with level 2 tband. RLE SLRx 8, BLE clam shells x 10   Gait triaingin for 127fand 2040fith RW, no KI and min A, as well as cues for improved heel strike, improved step to gait pattern. Patient demonstrated no knee buckle of hyperextension with gait or transfers. .   PT adjusted L break for improved safety with transfers to prevent WC movement with transfers.  [facility] with call bell within reach and all other needs met.   Therapy Documentation Precautions:  Precautions Precautions: Fall Precaution Comments: right sided weakness and dyscoordination Required Braces or Orthoses: Knee Immobilizer - Right Knee Immobilizer - Right:  (when walking) Restrictions Weight Bearing Restrictions: No General:   Vital Signs:  Pain: Pain Assessment Pain Assessment: No/denies pain Pain Score: 0-No pain   See Function Navigator for Current Functional Status.   Therapy/Group: Individual Therapy  AusLorie Phenix20/2017, 12:47 PM

## 2015-09-22 NOTE — Progress Notes (Signed)
Speech Language Pathology Daily Session Note  Patient Details  Name: [name] [name] MRN: [id number] Date of Birth: August 15, 1950  Today's Date: 09/22/2015 SLP Individual Time: 1330-1400 SLP Individual Time Calculation (min): 30 min  Short Term Goals: Week 1: SLP Short Term Goal 1 (Week 1): Patient will demonstrate functional problem solving for familiar tasks with Min A verbal and question cues.  SLP Short Term Goal 2 (Week 1): Patient will demonstrate selective attention to functional tasks in a mildly distracting enviornment with Min A verbal cues for redirection for 30 minutes.  SLP Short Term Goal 3 (Week 1): Patient will attend to RUE and RLE during functional tasks with Min A verbal and question cues.  SLP Short Term Goal 4 (Week 1): Patient will utilize external memory aids to recall new, daily information with Min A verbal and question cues.   Skilled Therapeutic Interventions: Pt participated in a functional organization task for cooking prep. Pt required min A of question cueing to recall all of ingredients needed to make a familiar dish and min A to communicate the steps in the appropriate order to produce the dish. Pt was hardworking and benefited from verbal encouragement to complete the task. Pt independently modified the environment to decrease distraction. Able to relay events from previous therapy sessions on this date.  Function:    Cognition Comprehension Comprehension assist level: Understands complex 90% of the time/cues 10% of the time  Expression   Expression assist level: Expresses complex 90% of the time/cues < 10% of the time  Social Interaction Social Interaction assist level: Interacts appropriately with others with medication or extra time (anti-anxiety, antidepressant).  Problem Solving Problem solving assist level: Solves complex 90% of the time/cues < 10% of the time  Memory Memory assist level: Recognizes or recalls 90% of the time/requires cueing < 10% of  the time    Pain Pain Assessment Pain Assessment: No/denies pain Pain Score: 0-No pain  Therapy/Group: Individual Therapy  Vinetta Bergamo 09/22/2015, 2:40 PM

## 2015-09-22 NOTE — Discharge Instructions (Addendum)
Information on my medicine - XARELTO (rivaroxaban)  This medication education was reviewed with me or my healthcare representative as part of my discharge preparation.    WHY WAS XARELTO PRESCRIBED FOR YOU? Xarelto was prescribed to treat blood clots that may have been found in the veins of your legs (deep vein thrombosis) or in your lungs (pulmonary embolism) and to reduce the risk of them occurring again.  What do you need to know about Xarelto? Continue taking your home dose 20 mg ONCE A DAY with your evening meal.  DO NOT stop taking Xarelto without talking to the health care provider who prescribed the medication.  Refill your prescription for 20 mg tablets before you run out.  After discharge, you should have regular check-up appointments with your healthcare provider that is prescribing your Xarelto.  In the future your dose may need to be changed if your kidney function changes by a significant amount.  What do you do if you miss a dose? If you are taking Xarelto TWICE DAILY and you miss a dose, take it as soon as you remember. You may take two 15 mg tablets (total 30 mg) at the same time then resume your regularly scheduled 15 mg twice daily the next day.  If you are taking Xarelto ONCE DAILY and you miss a dose, take it as soon as you remember on the same day then continue your regularly scheduled once daily regimen the next day. Do not take two doses of Xarelto at the same time.   Important Safety Information Xarelto is a blood thinner medicine that can cause bleeding. You should call your healthcare provider right away if you experience any of the following: ? Bleeding from an injury or your nose that does not stop. ? Unusual colored urine (red or dark brown) or unusual colored stools (red or black). ? Unusual bruising for unknown reasons. ? A serious fall or if you hit your head (even if there is no bleeding).  Some medicines may interact with Xarelto and might  increase your risk of bleeding while on Xarelto. To help avoid this, consult your healthcare provider or pharmacist prior to using any new prescription or non-prescription medications, including herbals, vitamins, non-steroidal anti-inflammatory drugs (NSAIDs) and supplements.  This website has more information on Xarelto: [email or web address] Inpatient Rehab Discharge Instructions  [name] [name] Discharge date and time: No discharge date for patient encounter.   Activities/Precautions/ Functional Status: Activity: activity as tolerated Diet: regular diet Wound Care: none needed Functional status:  ___ No restrictions     ___ Walk up steps independently ___ 24/7 supervision/assistance   ___ Walk up steps with assistance ___ Intermittent supervision/assistance  ___ Bathe/dress independently ___ Walk with walker     _x__ Bathe/dress with assistance ___ Walk Independently    ___ Shower independently ___ Walk with assistance    ___ Shower with assistance ___ No alcohol     ___ Return to work/school ________  COMMUNITY REFERRALS UPON DISCHARGE:   Home Health:  RN              Agency:  [employer] Phone:  [phone] Medical Equipment/Items Ordered:  Rolling walker and tub transfer bench  Agency/Supplier:  [employer]         Phone:  [phone]  GENERAL COMMUNITY RESOURCES FOR PATIENT/FAMILY: Support Groups:  GI Cancer Support Group  Monday, April 17, 6-7:30 PM                              [place] at [facility], Classroom [id number]                              To register, call Johnnye Lana at [phone]  And there are other groups at the [facility].  Go to [email or web address]  Special Instructions: Follow-up with oncology services as directed   Iodoform dressing to right forearm with Kerlex change daily as needed   My questions have been answered and I understand these instructions. I will adhere  to these goals and the provided educational materials after my discharge from the hospital.  Patient/Caregiver Signature _______________________________ Date __________  Clinician Signature _______________________________________ Date __________  Please bring this form and your medication list with you to all your follow-up doctor's appointments.

## 2015-09-23 ENCOUNTER — Inpatient Hospital Stay (HOSPITAL_COMMUNITY): Payer: Medicaid Other | Admitting: Occupational Therapy

## 2015-09-23 ENCOUNTER — Inpatient Hospital Stay (HOSPITAL_COMMUNITY): Payer: Medicaid Other | Admitting: Speech Pathology

## 2015-09-23 ENCOUNTER — Inpatient Hospital Stay (HOSPITAL_COMMUNITY): Payer: Medicaid Other | Admitting: *Deleted

## 2015-09-23 ENCOUNTER — Inpatient Hospital Stay (HOSPITAL_COMMUNITY): Payer: Medicaid Other | Admitting: Physical Therapy

## 2015-09-23 DIAGNOSIS — L03113 Cellulitis of right upper limb: Secondary | ICD-10-CM

## 2015-09-23 DIAGNOSIS — D72829 Elevated white blood cell count, unspecified: Secondary | ICD-10-CM | POA: Insufficient documentation

## 2015-09-23 LAB — BASIC METABOLIC PANEL
ANION GAP: 13 (ref 5–15)
BUN: 20 mg/dL (ref 6–20)
CALCIUM: 12.4 mg/dL — AB (ref 8.9–10.3)
CO2: 22 mmol/L (ref 22–32)
Chloride: 101 mmol/L (ref 101–111)
Creatinine, Ser: 0.62 mg/dL (ref 0.44–1.00)
GFR calc Af Amer: >60 mL/min (ref >60)
GFR calc non Af Amer: >60 mL/min (ref >60)
GLUCOSE: 115 mg/dL — AB (ref 65–99)
Potassium: 4.7 mmol/L (ref 3.5–5.1)
Sodium: 136 mmol/L (ref 135–145)

## 2015-09-23 LAB — CBC WITH DIFFERENTIAL/PLATELET
BASOS PCT: 0 %
Basophils Absolute: 0 10*3/uL (ref 0.0–0.1)
Eosinophils Absolute: 0 10*3/uL (ref 0.0–0.7)
Eosinophils Relative: 0 %
HEMATOCRIT: 36.5 % (ref 36.0–46.0)
Hemoglobin: 12.4 g/dL (ref 12.0–15.0)
LYMPHS ABS: 0.8 10*3/uL (ref 0.7–4.0)
Lymphocytes Relative: 7 %
MCH: 28.1 pg (ref 26.0–34.0)
MCHC: 34 g/dL (ref 30.0–36.0)
MCV: 82.8 fL (ref 78.0–100.0)
MONO ABS: 0.7 10*3/uL (ref 0.1–1.0)
MONOS PCT: 6 %
NEUTROS ABS: 10.1 10*3/uL — AB (ref 1.7–7.7)
Neutrophils Relative %: 87 %
Platelets: 390 10*3/uL (ref 150–400)
RBC: 4.41 MIL/uL (ref 3.87–5.11)
RDW: 14.4 % (ref 11.5–15.5)
WBC: 11.6 10*3/uL — ABNORMAL HIGH (ref 4.0–10.5)

## 2015-09-23 LAB — URINALYSIS, ROUTINE W REFLEX MICROSCOPIC
BILIRUBIN URINE: NEGATIVE
GLUCOSE, UA: NEGATIVE mg/dL
KETONES UR: NEGATIVE mg/dL
NITRITE: NEGATIVE
PH: 5 (ref 5.0–8.0)
Protein, ur: NEGATIVE mg/dL
Specific Gravity, Urine: 1.022 (ref 1.005–1.030)

## 2015-09-23 LAB — URINE MICROSCOPIC-ADD ON

## 2015-09-23 MED ORDER — SODIUM CHLORIDE 0.9 % IV SOLN
60.0000 mg | Freq: Once | INTRAVENOUS | Status: AC
  Administered 2015-09-23: 60 mg via INTRAVENOUS
  Filled 2015-09-23: qty 20

## 2015-09-23 MED ORDER — SULFAMETHOXAZOLE-TRIMETHOPRIM 800-160 MG PO TABS
1.0000 | ORAL_TABLET | Freq: Two times a day (BID) | ORAL | Status: AC
  Administered 2015-09-23 – 2015-09-27 (×10): 1 via ORAL
  Filled 2015-09-23 (×10): qty 1

## 2015-09-23 NOTE — Progress Notes (Signed)
[place] PHYSICAL MEDICINE & REHABILITATION     PROGRESS NOTE  Subjective/Complaints:  Patient sitting up in her chair this morning. She complains of a sensitive area with some firmness along her dorsal forearm. She states that she sustained this injury when she fell prior to coming the hospital.  ROS: + Right dorsal forearm discomfort. Denies CP, SOB, n/v/d.  Objective: Vital Signs: Blood pressure 110/57, pulse 67, temperature 98.2 F (36.8 C), temperature source Oral, resp. rate 16, SpO2 99 %. No results found.  Recent Labs  09/23/15 0556  WBC 11.6*  HGB 12.4  HCT 36.5  PLT 390    Recent Labs  09/23/15 0556  NA 136  K 4.7  CL 101  GLUCOSE 115*  BUN 20  CREATININE 0.62  CALCIUM 12.4*   CBG (last 3)  No results for input(s): GLUCAP in the last 72 hours.  Wt Readings from Last 3 Encounters:  09/13/15 67.132 kg (148 lb)  08/01/15 70.489 kg (155 lb 6.4 oz)  06/20/15 70.761 kg (156 lb)    Physical Exam:  BP 110/57 mmHg  Pulse 67  Temp(Src) 98.2 F (36.8 C) (Oral)  Resp 16  SpO2 99%  Constitutional: She appears well-developed. NAD. Vital signs reviewed.  HENT: Normocephalic and atraumatic.  Mouth: Oral ulcers Eyes: Conjunctivae and EOM are normal.  Cardiovascular: Normal rate and regular rhythm.No murmur heard. Respiratory: Effort normal and breath sounds normal. No stridor. No respiratory distress. She has no wheezes.  GI: Soft. Bowel sounds are normal. She exhibits no distension. There is no tenderness.  Musculoskeletal: She exhibits no edema or tenderness.  Neurological: She is alert and oriented. Speech clear.  Follows commands without difficulty.  Motor LUE/LLE 5/5.   RUE: deltoids 4+/5, biceps 4+/5, triceps 4+/5, intrinsics 4+/5.  RLE: HF 4+/5, KE 4+/5, PF/DF 4+/5.  Skin: Skin is warm and dry. Area of induration along the dorsal forearm  Psychiatric: She has a normal mood and affect. Her behavior is normal. Judgment and thought content  normal  Assessment/Plan: 1. 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 Physiatrist is providing close team supervision and 24 hour management of active medical problems listed below. Physiatrist and rehab team continue to assess barriers to discharge/monitor patient progress toward functional and medical goals.  Function:  Bathing Bathing position   Position: Shower  Bathing parts Body parts bathed by patient: Right arm, Left arm, Abdomen, Chest, Front perineal area, Buttocks, Right upper leg, Left upper leg, Back Body parts bathed by helper: Right lower leg, Left lower leg  Bathing assist Assist Level:  (pt reported bathing all parts at bed level before tx- didnt wash feet )      Upper Body Dressing/Undressing Upper body dressing   What is the patient wearing?: Bra, Pull over shirt/dress Bra - Perfomed by patient: Thread/unthread right bra strap, Thread/unthread left bra strap, Hook/unhook bra (pull down sports bra)   Pull over shirt/dress - Perfomed by patient: Thread/unthread right sleeve, Thread/unthread left sleeve, Put head through opening, Pull shirt over trunk          Upper body assist Assist Level: Set up   Set up : To obtain clothing/put away  Lower Body Dressing/Undressing Lower body dressing   What is the patient wearing?: Underwear, Pants, [employer], Shoes Underwear - Performed by patient: Thread/unthread left underwear leg, Pull underwear up/down Underwear - Performed by helper: Thread/unthread right  underwear leg Pants- Performed by patient: Thread/unthread left pants leg, Pull pants up/down, Thread/unthread right pants leg   Non-skid slipper socks- Performed by patient: Don/doff left sock Non-skid slipper socks- Performed by helper: Don/doff right sock     Shoes - Performed by patient: Don/doff left shoe, Fasten right, Fasten  left Shoes - Performed by helper: Don/doff right shoe       TED Hose - Performed by helper: Don/doff right TED hose, Don/doff left TED hose  Lower body assist Assist for lower body dressing:  (patient completed 70%, moderate assist)      Toileting Toileting   Toileting steps completed by patient: Adjust clothing prior to toileting, Performs perineal hygiene, Adjust clothing after toileting Toileting steps completed by helper: Adjust clothing prior to toileting Toileting Assistive Devices: Grab bar or rail  Toileting assist Assist level: Supervision or verbal cues   Transfers Chair/bed transfer   Chair/bed transfer method: Stand pivot Chair/bed transfer assist level: Touching or steadying assistance (Pt > 75%) Chair/bed transfer assistive device: Armrests, Medical sales representative     Max distance: 65ft Assist level: Touching or steadying assistance (Pt > 75%)   Wheelchair   Type: Manual Max wheelchair distance: 200 Assist Level: Supervision or verbal cues  Cognition Comprehension Comprehension assist level: Follows complex conversation/direction with no assist  Expression Expression assist level: Expresses complex ideas: With no assist  Social Interaction Social Interaction assist level: Interacts appropriately with others - No medications needed.  Problem Solving Problem solving assist level: Solves complex problems: Recognizes & self-corrects  Memory Memory assist level: Complete Independence: No helper     Medical Problem List and Plan: 1. 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 Follow-up per radiation oncology Dr. Patric Dykes and oncology services Dr. Burr Medico admitted on 3/15.  Continue decadron 9 mg bid chronically until seen by Oncologist S/p stereotactic treatment on 09/18/15  Per Rad/Onc, pt to follow up in ~4/16 2.PE: Xarelto 3. Pain Management: Neurontin 300 mg 3 times a day,  baclofen 5 mg 3 times a day. 4. Constipation. Laxative assistance 5. Neuropsych: This patient is capable of making decisions on her own behalf. 6. Skin/Wound Care: Routine skin checks 7. Fluids/Electrolytes/Nutrition: Routine I&O 8. Hypercalcemia:   Will order IV pamidronate today  Ca++ 12.4 on 3/21 9. Anemia: Cont to monitor, consider transfusion if necessary.  Hb 12.4 on 3/21  10. Tachycardia: Continue to monitor with increased activity. Consider medications if necessary 11.  HTN: Resolved  Overall well controlled without medications  Will cont to monitor 12. Recurrent aphthous stomatitis  Fluocinonide QID until ulcer healed 13. Leukocytosis, likely secondary to cellulitis right dorsal forearm  Will start Bactrim on 3/21  Will also order UA, Ucx  Will continue to monitor  Days) 6 A FACE TO FACE EVALUATION WAS PERFORMED  Gale Klar Lorie Phenix 09/23/2015 9:32 AM

## 2015-09-23 NOTE — Progress Notes (Signed)
Occupational Therapy Session Note  Patient Details  Name: [name] [name] MRN: [id number] Date of Birth: [birth year]  Today's Date: 09/23/2015 OT Individual Time: 1000-1100 OT Individual Time Calculation (min): 60 min    Short Term Goals: Week 1:  OT Short Term Goal 1 (Week 1): Pt will maintaining standing balance with even weight distribution through LEs during a functional task with min A OT Short Term Goal 2 (Week 1): Pt will setup w/c in prep for transfer with mod VC (supervision) OT Short Term Goal 3 (Week 1): Pt will perform 3/3 toileting tasks with close supervision OT Short Term Goal 4 (Week 1): Pt will use right Ue at dominant level witih mod VC throughout a 60 min session  Skilled Therapeutic Interventions/Progress Updates:  Upon entering the room, pt seated in wheelchair awaiting therapist with no c/o pain this session. Pt agreeable to OT intervention but declined bathing and dressing as she reports she performed these tasks prior to therapist arrival. Pt propelled wheelchair with B UEs and LEs to ADL apartment with supervision 100'. Pt engaged in functional transfers with RW on carpeted surface much like home environment with steady assistance. Pt continues to not wear KI during this session with no visible R knee buckle with functional tasks. Pt transferred on/off of standard height bed and soft, plush couch with steady assistance. OT educating and demonstrating use of RW for shower transfer utilizing shower seat with simulated threshold. Pt returned demonstration with min A for balance. Pt ambulating from ADL apartment 52' with RW and steady assist for safety. Pt then taking seated rest break and engaged in B LE exercises. Pt required min A to stand from chair and continued ambulating towards room. Pt needing verbal cues to look ahead vs down at floor and proper advancement of RW for safety. Pt returned to room where she sat in wheelchair with call bell and all needed items within reach  upon exiting the room.   Therapy Documentation Precautions:  Precautions Precautions: Fall Precaution Comments: right sided weakness and dyscoordination Required Braces or Orthoses: Knee Immobilizer - Right Knee Immobilizer - Right:  (when walking) Restrictions Weight Bearing Restrictions: No   Pain: Pain Assessment Pain Score: 0-No pain Pain Type: Acute pain Pain Location: Abdomen Pain Descriptors / Indicators: Cramping Pain Intervention(s): Medication (See eMAR) ADL: ADL ADL Comments: see functional navigator  See Function Navigator for Current Functional Status.   Therapy/Group: Individual Therapy  Phineas Semen 09/23/2015, 12:25 PM

## 2015-09-23 NOTE — Progress Notes (Signed)
Speech Language Pathology Daily Session Notes  Patient Details  Name: [name] [name] MRN: [id number] Date of Birth: 07/19/1950  Today's Date: 09/23/2015  Session 1: SLP Individual Time: 1130-1200 SLP Individual Time Calculation (min): 30 min   Session 2: SLP Individual Time: [id number] SLP Individual Time Calculation (min): 30 min  Short Term Goals: Week 1: SLP Short Term Goal 1 (Week 1): Patient will demonstrate functional problem solving for familiar tasks with Min A verbal and question cues.  SLP Short Term Goal 2 (Week 1): Patient will demonstrate selective attention to functional tasks in a mildly distracting enviornment with Min A verbal cues for redirection for 30 minutes.  SLP Short Term Goal 3 (Week 1): Patient will attend to RUE and RLE during functional tasks with Min A verbal and question cues.  SLP Short Term Goal 4 (Week 1): Patient will utilize external memory aids to recall new, daily information with Min A verbal and question cues.   Skilled Therapeutic Interventions:  Session 1: Skilled treatment session focused on cognitive goals. SLP facilitated session by providing supervision verbal and question cues for recall of her current medications and their functions while making an external memory aid. Patient propelled himself to and from his room to the SLP office with extra time and supervision verbal cues to attend to RLE. Patient left upright in wheelchair with all needs within reach. Continue with current plan of care.   Session 2: Skilled treatment session focused on cognitive goals. SLP facilitated session by providing Min A verbal and question cues for functional problem solving during a mildly complex medication management task of organizing a 4 tim per day pull box with her current medications. Patient utilized and attended to her RUE throughout task with Mod I and demonstrated alternating attention between task and conversation with supervision verbal cues. Patient  left sitting EOB with all needs within reach. Continue with current plan of care.   Function:  Cognition Comprehension Comprehension assist level: Understands complex 90% of the time/cues 10% of the time  Expression   Expression assist level: Expresses complex ideas: With no assist  Social Interaction Social Interaction assist level: Interacts appropriately with others - No medications needed.  Problem Solving Problem solving assist level: Solves basic 90% of the time/requires cueing < 10% of the time  Memory Memory assist level: Recognizes or recalls 90% of the time/requires cueing < 10% of the time    Pain No reports of pain   Therapy/Group: Individual Therapy  Tavia Stave 09/23/2015, 2:41 PM

## 2015-09-23 NOTE — Progress Notes (Signed)
Physical Therapy Session Note  Patient Details  Name: [name] [name] MRN: [id number] Date of Birth: Oct 25, 1950  Today's Date: 09/23/2015 PT Individual Time: 8115-7262 PT Individual Time Calculation (min): 38 min   Short Term Goals: Week 1:  PT Short Term Goal 1 (Week 1): =LTGs due to ELOS  Skilled Therapeutic Interventions/Progress Updates:    Pt received sitting EOB and agreeable to therapy session, no c/o pain.  Pt propelled w/c towards therapy gym with supervision and more than a reasonable amount of time using BLEs for NMR and strengthening.  Pt demos difficulty with coordination BLEs to propel w/c and requires verbal cues for efficient pattern.  Sit<>stand from w/c with RW and supervision and gait training x87' with RW and light steady assist for balance.  Pt demos improved RLE control, R knee control and heel strike during gait. Nustep x12 minutes at level 5 for endurance, NMR, and overall strengthening.  HR following Nustep 126, returned below 100 in 2 minute rest break.  Pt performed squat/pivot nustep>w/c>bed with close supervision and able to set up w/c appropriately for transfer without verbal cues.  Dynamic sitting balance for LB dressing (removing TEDs and donning socks) with supervision, and pt positioned supine in bed with call bell in reach and needs met.   Therapy Documentation Precautions:  Precautions Precautions: Fall Precaution Comments: right sided weakness and dyscoordination Required Braces or Orthoses: Knee Immobilizer - Right Knee Immobilizer - Right:  (when walking) Restrictions Weight Bearing Restrictions: No   See Function Navigator for Current Functional Status.   Therapy/Group: Individual Therapy  [name] [name] 09/23/2015, 4:42 PM

## 2015-09-23 NOTE — Progress Notes (Signed)
Physical Therapy Session Note  Patient Details  Name: [name] [name] MRN: [id number] Date of Birth: 1951-03-28  Today's Date: 09/23/2015 PT Individual Time: 1410-1440 PT Individual Time Calculation (min): 30 min   Short Term Goals: Week 1:  PT Short Term Goal 1 (Week 1): =LTGs due to ELOS  Skilled Therapeutic Interventions/Progress Updates:  Tx focused on functional mobility training, gait with RW, and therex for strengthening, and multi-modal cues. 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 Pt needed safety cues throughout. Discussed fall risk reduction, pt verbalized understanding and will benefit from further standing balance training.   Therex including seated LAQ, marching, and heel toe raises, focusing on R DF 2x10 each.  Standing hip ABD, heel toe raises, and marching 2x10 with cues for full RLE ext prior to LLE motions.   Sit<>stands transfers with close S and cues for safety x6 throughout.   Gait training 2x150' with min A for steadying and cues for foot clearance, step length, heel-toe pattern, and RLE hip/knee ext in stance.  Pt left EOB with all needs in reach.       Therapy Documentation Precautions:  Precautions Precautions: Fall Precaution Comments: right sided weakness and dyscoordination Required Braces or Orthoses: Knee Immobilizer - Right Knee Immobilizer - Right:  (when walking) Restrictions Weight Bearing Restrictions: No General:   Vital Signs: Therapy Vitals Temp: 98.2 F (36.8 C) Temp Source: Oral Pulse Rate: (!) 103 Resp: 16 BP: (!) 148/56 mmHg Patient Position (if appropriate): Sitting Oxygen Therapy SpO2: 99 % O2 Device: Not Delivered Pain: none        See Function Navigator for Current Functional Status.   Therapy/Group: Individual Therapy  Sylwia Cuervo Soundra Pilon, PT, DPT  09/23/2015, 2:41 PM

## 2015-09-24 ENCOUNTER — Inpatient Hospital Stay (HOSPITAL_COMMUNITY): Payer: Medicaid Other | Admitting: Physical Therapy

## 2015-09-24 ENCOUNTER — Inpatient Hospital Stay (HOSPITAL_COMMUNITY): Payer: Medicaid Other | Admitting: Speech Pathology

## 2015-09-24 ENCOUNTER — Inpatient Hospital Stay (HOSPITAL_COMMUNITY): Payer: Medicaid Other | Admitting: Occupational Therapy

## 2015-09-24 LAB — URINE CULTURE

## 2015-09-24 NOTE — Progress Notes (Signed)
Occupational Therapy Session Note  Patient Details  Name: KATELEN WINCHEL MRN: [id number] Date of Birth: [birth year]  Today's Date: 09/24/2015 OT Individual Time: 0702-0759 OT Individual Time Calculation (min): 57 min    Short Term Goals: Week 1:  OT Short Term Goal 1 (Week 1): Pt will maintaining standing balance with even weight distribution through LEs during a functional task with min A OT Short Term Goal 2 (Week 1): Pt will setup w/c in prep for transfer with mod VC (supervision) OT Short Term Goal 3 (Week 1): Pt will perform 3/3 toileting tasks with close supervision OT Short Term Goal 4 (Week 1): Pt will use right Ue at dominant level witih mod VC throughout a 60 min session  Skilled Therapeutic Interventions/Progress Updates:  Upon entering the room, pt supine in bed with no c/o pain this session. Pt performed supine >sit with supervision. Pt ambulated with RW and without use of KI to bathroom for shower transfer. Pt performed shower transfer with min A for balance onto TTB. OT educating pt on recommendation for shower chair at home once discharged with pt verbalizing understanding. Pt engaged in bathing from TTB and sit <>stand with use of grab bar and steady assist to wash buttocks and peri area. Pt dressed from TTB as well with set up A for UB self care and LB dressing with min A. Pt ambulating to sink after exiting the shower for grooming tasks at sink. OT educated pt on progress towards OT goals this session as well. Pt stating her goal as, "To do all this stuff my myself again and without the RW." Pt remained in wheelchair with call bell and all needed items within reach upon exiting the room.   Therapy Documentation Precautions:  Precautions Precautions: Fall Precaution Comments: right sided weakness and dyscoordination Required Braces or Orthoses: Knee Immobilizer - Right Knee Immobilizer - Right:  (when walking) Restrictions Weight Bearing Restrictions: No General:    Vital Signs: Therapy Vitals Temp: 98.4 F (36.9 C) Temp Source: Oral Pulse Rate: 69 Resp: 18 BP: (!) 109/57 mmHg Patient Position (if appropriate): Lying Oxygen Therapy SpO2: 99 % O2 Device: Not Delivered Pain:   ADL: ADL ADL Comments: see functional navigator Exercises:   Other Treatments:    See Function Navigator for Current Functional Status.   Therapy/Group: Individual Therapy  Phineas Semen 09/24/2015, 8:00 AM

## 2015-09-24 NOTE — Progress Notes (Addendum)
[facility] PHYSICAL MEDICINE & REHABILITATION     PROGRESS NOTE  Subjective/Complaints:  Patient sitting up in her chair this AM about to work with therapies.  She requests to d/c her gabapentin and baclofen due to side effects and lack of need.    ROS:  Denies CP, SOB, n/v/d.  Objective: Vital Signs: Blood pressure 109/57, pulse 69, temperature 98.4 F (36.9 C), temperature source Oral, resp. rate 18, SpO2 99 %. No results found.  Recent Labs  09/23/15 0556  WBC 11.6*  HGB 12.4  HCT 36.5  PLT 390    Recent Labs  09/23/15 0556  NA 136  K 4.7  CL 101  GLUCOSE 115*  BUN 20  CREATININE 0.62  CALCIUM 12.4*   CBG (last 3)  No results for input(s): GLUCAP in the last 72 hours.  Wt Readings from Last 3 Encounters:  09/13/15 67.132 kg (148 lb)  08/01/15 70.489 kg (155 lb 6.4 oz)  06/20/15 70.761 kg (156 lb)    Physical Exam:  BP 109/57 mmHg  Pulse 69  Temp(Src) 98.4 F (36.9 C) (Oral)  Resp 18  SpO2 99%  Constitutional: She appears well-developed. NAD. Vital signs reviewed.  HENT: Normocephalic and atraumatic.  Mouth: Oral ulcers Eyes: Conjunctivae and EOM are normal.  Cardiovascular: Normal rate and regular rhythm.No murmur heard. Respiratory: Effort normal and breath sounds normal. No stridor. No respiratory distress. She has no wheezes.  GI: Soft. Bowel sounds are normal. She exhibits no distension. There is no tenderness.  Musculoskeletal: She exhibits no edema or tenderness.  Neurological: She is alert and oriented. Speech clear.  Follows commands without difficulty.  Motor LUE/LLE 5/5.   RUE: 5/5 proximal to distal RLE: HF 4+/5, KE 4+/5, PF/DF 4+/5.  Skin: Skin is warm and dry. Area of induration along the dorsal forearm (improving) Psychiatric: She has a normal mood and affect. Her behavior is normal. Judgment and thought content normal  Assessment/Plan: 1. 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 Physiatrist is providing close team supervision and 24 hour management of active medical problems listed below. Physiatrist and rehab team continue to assess barriers to discharge/monitor patient progress toward functional and medical goals.  Function:  Bathing Bathing position   Position: Shower  Bathing parts Body parts bathed by patient: Right arm, Left arm, Chest, Abdomen, Front perineal area, Buttocks, Right upper leg, Left upper leg, Right lower leg, Left lower leg Body parts bathed by helper: Right lower leg, Left lower leg  Bathing assist Assist Level: Touching or steadying assistance(Pt > 75%)   Set up : To obtain items  Upper Body Dressing/Undressing Upper body dressing   What is the patient wearing?: Bra, Pull over shirt/dress Bra - Perfomed by patient: Thread/unthread right bra strap, Thread/unthread left bra strap, Hook/unhook bra (pull down sports bra)   Pull over shirt/dress - Perfomed by patient: Thread/unthread right sleeve, Thread/unthread left sleeve, Put head through opening, Pull shirt over trunk          Upper body assist Assist Level: Set up   Set up : To obtain clothing/put away  Lower Body Dressing/Undressing Lower body dressing   What is the patient wearing?: Underwear, Pants, [employer], Shoes Underwear - Performed by patient: Thread/unthread left underwear leg, Pull underwear up/down, Thread/unthread right underwear leg Underwear - Performed by helper: Thread/unthread right underwear leg Pants- Performed by patient: Thread/unthread left pants  leg, Pull pants up/down, Thread/unthread right pants leg   Non-skid slipper socks- Performed by patient: Don/doff left sock Non-skid slipper socks- Performed by helper: Don/doff right sock     Shoes - Performed by patient: Don/doff left shoe, Fasten right, Fasten left, Don/doff right shoe Shoes - Performed by helper: Don/doff  right shoe     TED Hose - Performed by patient: Don/doff right TED hose, Don/doff left TED hose TED Hose - Performed by helper: Don/doff right TED hose, Don/doff left TED hose  Lower body assist Assist for lower body dressing: Touching or steadying assistance (Pt > 75%)      Toileting Toileting   Toileting steps completed by patient: Adjust clothing prior to toileting, Performs perineal hygiene, Adjust clothing after toileting Toileting steps completed by helper: Adjust clothing prior to toileting Toileting Assistive Devices: Grab bar or rail  Toileting assist Assist level: Supervision or verbal cues   Transfers Chair/bed transfer   Chair/bed transfer method: Squat pivot, Ambulatory Chair/bed transfer assist level: Supervision or verbal cues Chair/bed transfer assistive device: Armrests, Medical sales representative     Max distance: 220 Assist level: Supervision or verbal cues   Wheelchair   Type: Manual Max wheelchair distance: 150 Assist Level: Supervision or verbal cues  Cognition Comprehension Comprehension assist level: Follows complex conversation/direction with no assist  Expression Expression assist level: Expresses complex ideas: With no assist  Social Interaction Social Interaction assist level: Interacts appropriately with others - No medications needed.  Problem Solving Problem solving assist level: Solves complex problems: Recognizes & self-corrects  Memory Memory assist level: Complete Independence: No helper     Medical Problem List and Plan: 1. 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 Follow-up per radiation oncology Dr. Patric Dykes and oncology services Dr. Burr Medico admitted on 3/15.  Continue decadron 9 mg bid chronically until seen by Oncologist S/p stereotactic treatment on 09/18/15  Per Rad/Onc, pt to follow up in ~4/16 2.PE: Xarelto 3. Pain Management:   Pain well  controlled   Will d/c Neurontin 300 mg 3 times a day, baclofen 5 mg 3 times a day. 4. Constipation. Laxative assistance 5. Neuropsych: This patient is capable of making decisions on her own behalf. 6. Skin/Wound Care: Routine skin checks 7. Fluids/Electrolytes/Nutrition: Routine I&O 8. Hypercalcemia:   IV pamidronate on 3/22  Ca++ 12.4 on 3/21 9. Anemia: Cont to monitor, consider transfusion if necessary.  Hb 12.4 on 3/21   Will order labs for tomorrow 10. Tachycardia: Continue to monitor with increased activity. Consider medications if necessary 11.  HTN: Resolved  Overall well controlled without medications  Will cont to monitor 12. Recurrent aphthous stomatitis  Fluocinonide QID until ulcer healed 13. Leukocytosis, likely secondary to cellulitis right dorsal forearm  Started Bactrim on 3/21  UA with few bacteria, Ucx with multiple species will reoder  Will order labs for tomorrow  Days) 7 A FACE TO FACE EVALUATION WAS PERFORMED  Ankit Lorie Phenix 09/24/2015 12:31 PM

## 2015-09-24 NOTE — Progress Notes (Signed)
Physical Therapy Weekly Progress Note  Patient Details  Name: [name] [name] MRN: [id number] Date of Birth: 02/13/1951  Beginning of progress report period: September 18, 2015 End of progress report period: September 24, 2015  Today's Date: 09/24/2015 PT Individual Time: 905-958 [id number] PT Individual Time Calculation (min): 53 min and 25 min    Pt is making daily progress with therapy.  Pt has met w/c goals and therapy team determined with pt input that she will likely only require w/c for  distances.  Pt currently ambulating with close supervision and occasional need for mod assist for LOB.  Transfers supervision for squat/pivot and stand/pivot with RW.    Patient continues to demonstrate the following deficits: balance, orientation to midline during dynamic activity, and strength and therefore will continue to benefit from skilled PT intervention to enhance overall performance with balance and postural control.  Patient progressing toward long term goals..  Continue plan of care.  PT Short Term Goals Week 2:  PT Short Term Goal 1 (Week 2): =LTGs  Skilled Therapeutic Interventions/Progress Updates:     Session 1: Pt received resting in w/c with no c/o pain and agreeable to therapy session.  Pt amb to therapy gym [phone] with 1 seated rest break with Rw and close supervision.  Pt demos improved heel strike and R knee control during stance phase.  PT administered BERG balance test.  Patient demonstrates significant fall risk as noted by score of  38/56 on Berg Balance Scale.  Recommending use of RW and 24/7 supervision at home.  PT provided pt and family education for husband, [name] about fall risk and measures to take to increase safety and both verbalized understanding.  Pt amb to therapy apartment and performed dynamic balance task of putting away dishes with close supervision.  Pt amb back to room x220' with no rest break and RW with close supervision provided by husband, 1 LOB to L  when turning to sit in w/c, requiring assist from husband to regain balance.  PT providing supervision throughout gait training and husband demonstrates understanding of close supervision.  PT provided husband with education and demonstration for squat/pivot transfers and ambulatory transfers into bathroom and signed off on fall safety sheet.  Pt left sitting in w/c with call bell in reach and needs met.   Session 2: Pt received resting in w/c and agreeable to therapy session.  Session focus on pt education and HEP.  PT instructed pt in OTAGO level B exercises except stair walking and figure 8 walking.  Pt performed 10 reps of each exercise with supervision and verbal cues for correct sequencing.  Rest breaks as needed throughout session.  Pt returned to room and performed squat/pivot back to bed with supervision and pt demos correct w/c set up for transfer without cuing.  Pt left supine in bed with call bell in reach and needs met.   Therapy Documentation Precautions:  Precautions Precautions: Fall Precaution Comments: right sided weakness and dyscoordination Required Braces or Orthoses: Knee Immobilizer - Right Knee Immobilizer - Right:  (when walking) Restrictions Weight Bearing Restrictions: No   See Function Navigator for Current Functional Status.  Therapy/Group: Individual Therapy  Earnest Conroy Penven-Crew 09/24/2015, 5:13 PM

## 2015-09-24 NOTE — Progress Notes (Signed)
Speech Language Pathology Daily Session Note  Patient Details  Name: MEAGEN VATH MRN: [id number] Date of Birth: 12/07/1950  Today's Date: 09/24/2015 SLP Individual Time: 1000-1100 SLP Individual Time Calculation (min): 60 min  Short Term Goals: Week 1: SLP Short Term Goal 1 (Week 1): Patient will demonstrate functional problem solving for familiar tasks with Min A verbal and question cues.  SLP Short Term Goal 2 (Week 1): Patient will demonstrate selective attention to functional tasks in a mildly distracting enviornment with Min A verbal cues for redirection for 30 minutes.  SLP Short Term Goal 3 (Week 1): Patient will attend to RUE and RLE during functional tasks with Min A verbal and question cues.  SLP Short Term Goal 4 (Week 1): Patient will utilize external memory aids to recall new, daily information with Min A verbal and question cues.   Skilled Therapeutic Interventions: Pt participated in a complex deductive reasoning/planning task and required min A for reasoning and organization of information. Functional scheduling task required mod A for mathematics/reasoning re: time.    Function:   Cognition Comprehension Comprehension assist level: Understands complex 90% of the time/cues 10% of the time  Expression   Expression assist level: Expresses complex 90% of the time/cues < 10% of the time  Social Interaction Social Interaction assist level: Interacts appropriately with others with medication or extra time (anti-anxiety, antidepressant).  Problem Solving Problem solving assist level: Solves complex 90% of the time/cues < 10% of the time  Memory Memory assist level: Recognizes or recalls 90% of the time/requires cueing < 10% of the time    Pain Pain Assessment Pain Assessment: No/denies pain   Therapy/Group: Individual Therapy  Vinetta Bergamo 09/24/2015, 12:57 PM

## 2015-09-24 NOTE — Plan of Care (Signed)
Problem: RH Ambulation Goal: LTG Patient will ambulate in controlled environment (PT) LTG: Patient will ambulate in a controlled environment, # of feet with assistance (PT).  Upgraded 3/22 due to pt's progress  Problem: RH Wheelchair Mobility Goal: LTG Patient will propel w/c in controlled environment (PT) LTG: Patient will propel wheelchair in controlled environment, # of feet with assist (PT)  Outcome: Completed/Met Date Met:  09/24/15 Pt progressing, has met w/c goal but expect will surpass need for w/c at d/c  Goal: LTG Patient will propel w/c in home environment (PT) LTG: Patient will propel wheelchair in home environment, # of feet with assistance (PT).  Outcome: Completed/Met Date Met:  09/24/15 Pt progressing, has met w/c goal but expect will surpass need for w/c at d/c  Goal: LTG Patient will propel w/c in community environment (PT) LTG: Patient will propel wheelchair in community environment, # of feet with assist (PT)  Outcome: Completed/Met Date Met:  09/24/15 Pt progressing, has met w/c goal but expect will surpass need for w/c at d/c

## 2015-09-25 ENCOUNTER — Inpatient Hospital Stay (HOSPITAL_COMMUNITY): Payer: Medicaid Other

## 2015-09-25 ENCOUNTER — Inpatient Hospital Stay (HOSPITAL_COMMUNITY): Payer: Medicaid Other | Admitting: Physical Therapy

## 2015-09-25 ENCOUNTER — Telehealth: Payer: Self-pay | Admitting: *Deleted

## 2015-09-25 ENCOUNTER — Inpatient Hospital Stay (HOSPITAL_COMMUNITY): Payer: Medicaid Other | Admitting: Speech Pathology

## 2015-09-25 ENCOUNTER — Inpatient Hospital Stay (HOSPITAL_COMMUNITY): Payer: Medicaid Other | Admitting: Occupational Therapy

## 2015-09-25 ENCOUNTER — Other Ambulatory Visit: Payer: Self-pay | Admitting: Hematology

## 2015-09-25 DIAGNOSIS — D649 Anemia, unspecified: Secondary | ICD-10-CM

## 2015-09-25 LAB — CBC WITH DIFFERENTIAL/PLATELET
BASOS ABS: 0 10*3/uL (ref 0.0–0.1)
BASOS PCT: 0 %
EOS PCT: 0 %
Eosinophils Absolute: 0 10*3/uL (ref 0.0–0.7)
HCT: 35.3 % — ABNORMAL LOW (ref 36.0–46.0)
Hemoglobin: 12 g/dL (ref 12.0–15.0)
Lymphocytes Relative: 5 %
Lymphs Abs: 0.7 10*3/uL (ref 0.7–4.0)
MCH: 27.9 pg (ref 26.0–34.0)
MCHC: 34 g/dL (ref 30.0–36.0)
MCV: 82.1 fL (ref 78.0–100.0)
MONO ABS: 0.5 10*3/uL (ref 0.1–1.0)
Monocytes Relative: 4 %
Neutro Abs: 12.4 10*3/uL — ABNORMAL HIGH (ref 1.7–7.7)
Neutrophils Relative %: 91 %
PLATELETS: 412 10*3/uL — AB (ref 150–400)
RBC: 4.3 MIL/uL (ref 3.87–5.11)
RDW: 14.7 % (ref 11.5–15.5)
WBC: 13.5 10*3/uL — ABNORMAL HIGH (ref 4.0–10.5)

## 2015-09-25 LAB — BASIC METABOLIC PANEL
ANION GAP: 6 (ref 5–15)
BUN: 15 mg/dL (ref 6–20)
CALCIUM: 12 mg/dL — AB (ref 8.9–10.3)
CO2: 24 mmol/L (ref 22–32)
Chloride: 103 mmol/L (ref 101–111)
Creatinine, Ser: 0.72 mg/dL (ref 0.44–1.00)
GFR calc Af Amer: >60 mL/min (ref >60)
GFR calc non Af Amer: >60 mL/min (ref >60)
GLUCOSE: 127 mg/dL — AB (ref 65–99)
Potassium: 5 mmol/L (ref 3.5–5.1)
Sodium: 133 mmol/L — ABNORMAL LOW (ref 135–145)

## 2015-09-25 LAB — URINE CULTURE

## 2015-09-25 MED ORDER — POLYETHYLENE GLYCOL 3350 17 G PO PACK
17.0000 g | PACK | Freq: Every day | ORAL | Status: DC
  Administered 2015-09-25 – 2015-09-30 (×6): 17 g via ORAL
  Filled 2015-09-25 (×7): qty 1

## 2015-09-25 NOTE — Telephone Encounter (Signed)
I have sent a POF to mover her lab and f/p from 3/24 to 4/17, and will coordinate her schedule with her rad/onc follow up on 4/17.  [name] [name]  09/25/2015

## 2015-09-25 NOTE — Progress Notes (Signed)
Speech Language Pathology Daily Session Note  Patient Details  Name: [name] [name] MRN: [id number] Date of Birth: 05-27-1951  Today's Date: 09/25/2015 SLP Individual Time: 1100-1200 SLP Individual Time Calculation (min): 60 min  Short Term Goals: Week 1: SLP Short Term Goal 1 (Week 1): Patient will demonstrate functional problem solving for familiar tasks with Min A verbal and question cues.  SLP Short Term Goal 2 (Week 1): Patient will demonstrate selective attention to functional tasks in a mildly distracting enviornment with Min A verbal cues for redirection for 30 minutes.  SLP Short Term Goal 3 (Week 1): Patient will attend to RUE and RLE during functional tasks with Min A verbal and question cues.  SLP Short Term Goal 4 (Week 1): Patient will utilize external memory aids to recall new, daily information with Min A verbal and question cues.   Skilled Therapeutic Interventions: Pt participated in a 5 step sequencing task for functional activities. Pt initially required mod A to complete, but faded to mod I as pt gained confidence in ability and improved organization of information. Learning a moderately complex novel game requiring 3 component working memory completed with min A for reasoning.    Function:  Eating Eating                 Cognition Comprehension Comprehension assist level: Understands complex 90% of the time/cues 10% of the time  Expression   Expression assist level: Expresses complex 90% of the time/cues < 10% of the time  Social Interaction Social Interaction assist level: Interacts appropriately with others with medication or extra time (anti-anxiety, antidepressant).  Problem Solving Problem solving assist level: Solves complex 90% of the time/cues < 10% of the time  Memory Memory assist level: Recognizes or recalls 90% of the time/requires cueing < 10% of the time    Pain Pain Assessment Pain Assessment: No/denies pain  Therapy/Group: Individual  Therapy  Vinetta Bergamo 09/25/2015, 3:33 PM

## 2015-09-25 NOTE — Patient Care Conference (Signed)
Inpatient RehabilitationTeam Conference and Plan of Care Update Date: 09/24/2015   Time: 2:30 PM    Patient Name: [name] [name]      Medical Record Number: [id number]  Date of Birth: 10-17-1950 Sex: Female         Room/Bed: 4W24C/4W24C-01 Payor Info: Payor: MEDICAID [place] / Plan: MEDICAID [place] ACCESS / Product Type: *No Product type* /    Admitting Diagnosis: BRAIN METS  Admit Date/Time:  09/17/2015  6:09 PM Admission Comments: No comment available   Primary Diagnosis:  <principal problem not specified> Principal Problem: <principal problem not specified>  Patient Active Problem List   Diagnosis Date Noted  . Absolute anemia   . Leukocytosis   . Cellulitis of right upper extremity   . Recurrent aphthous stomatitis   . Benign essential HTN   . Abnormality of gait   . Hemiparesis ([place])   . Acute septic pulmonary embolism without acute cor pulmonale (H[facility])   . Anemia of chronic disease   . Chronic pulmonary embolism ([place])   . Ataxia   . Metastatic colon cancer to liver ([place])   . Brain metastases ([place]) 09/13/2015  . Right leg weakness 09/13/2015  . Brain metastasis ([place]) 09/13/2015  . Postoperative anemia due to acute blood loss 03/12/2015  . Long-term (current) use of anticoagulants 03/03/2015  . 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 Pulmonary embolism ([place]) 11/14/2014  . 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 Anxiety   . Upper abdominal pain   . Nausea with vomiting 06/12/2014  . 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 Colorectal cancer, stage IV, obstructing s/p stent x2, ostomy, then LAR 03/06/2015 06/05/2013  . Family history of colorectal cancer 06/05/2013  . Thrush, oral 06/05/2013  . Tachycardia 06/05/2013  . Hypercalcemia 05/24/2013    Expected Discharge Date: Expected Discharge Date: 10/01/15  Team Members Present: Physician leading conference: Dr. Delice Lesch Social Worker Present: Lennart Pall, LCSW Nurse Present: Dorthula Nettles, RN PT Present:  Dwyane Dee, PT OT Present: Benay Pillow, OT SLP Present: Weston Anna, SLP PPS Coordinator present : Daiva Nakayama, RN, CRRN     Current Status/Progress Goal Weekly Team Focus  Medical   Right side weakness with ataxia secondary to left brain metastatic mass from colon CA. Follow-up per radiation oncology Dr. Patric Dykes and oncology services Dr. Burr Medico admitted on 3/15  Improve mobility, ambulation, monitor labs  see above   Bowel/Bladder   pt continent of bowel and bladder LBM 3-21  Mod I  contiune POC   Swallow/Nutrition/ Hydration             ADL's   supervision - min A . Steady assistance for dynamic standing balance with occasional LOB posteriorly.   mod I - min A ( shower transfer and dynamic standing balance)  balance, self care retraining, strength, coordination tasks, pt and family education   Mobility   supervision transfers, min assist gait, supervision w/c  supervision all mobility, min assist gait  balance, safety awareness, gait, w/c propulsion   Communication             Safety/Cognition/ Behavioral Observations  Supervision-Min A  Supervision  awareness, attention, recall, problem solving    Pain   cramping to abdomen and headache at times. tylenol 650mg  PRN q4 hrs  3 or less  assess pain and medicate as needed   Skin   RUE- foam inplace. reddened area to forarm  will remain free of skin breakdown mod I  assess skin daily.  monitor spot on R forarm    Rehab Goals Patient on target to meet rehab goals: Yes Rehab Goals Revised: some goals have been updated due to pt's improvement and progress. *See Care Plan and progress notes for long and short-term goals.  Barriers to Discharge: Mobility, safety, ABLA, ?UTI, cellulitis, hypercalcemia    Possible Resolutions to Barriers:  Therapies, pt and family edu, follow labs, abx, hypercalcemia treated yesterday    Discharge Planning/Teaching Needs:  Pt plans to return to her home and have her family and  friends be with her when her husband is at work.  Husband has been through some family training.  Therapists are available to train other family members, as needed.   Team Discussion:  MD was able to d/c some of pt's meds today.  Pt is doing well with transfers and therapists plan to upgrade goals.  PT has d/c'd knee immobilizer as her knee is not buckling anymore.  Pt's cognition, memory, and attention to tasks is improved.  Pt will not need w/c at d/c.  Goals are still supervision overall.  Revisions to Treatment Plan:  none   Continued Need for Acute Rehabilitation Level of Care: The patient requires daily medical management by a physician with specialized training in physical medicine and rehabilitation for the following conditions: Daily direction of a multidisciplinary physical rehabilitation program to ensure safe treatment while eliciting the highest outcome that is of practical value to the patient.: Yes Daily medical management of patient stability for increased activity during participation in an intensive rehabilitation regime.: Yes Daily analysis of laboratory values and/or radiology reports with any subsequent need for medication adjustment of medical intervention for : Other  Muaad Boehning, [name] 09/25/2015, 2:20 PM

## 2015-09-25 NOTE — Progress Notes (Signed)
Physical Therapy Session Note  Patient Details  Name: [name] [name] MRN: [id number] Date of Birth: 05-Aug-1950  Today's Date: 09/25/2015 PT Individual Time: 0903-1000 and 1530-1600 PT Individual Time Calculation (min): 57 min and 30  Short Term Goals: Week 2:  PT Short Term Goal 1 (Week 2): =LTGs  Skilled Therapeutic Interventions/Progress Updates:    Session 1: Pt received resting in w/c with no c/o pain and agreeable to therapy session.  Gait training x 161' with RW and supervision to therapy gym.  Pt demos increasingly "normal" gait pattern with step through pattern, equalizing R/L step length, and improved RLE control.  Pt engaged in standing task with L foot supported on step to increase RLE weight bearing, turning to L to reach for cards and matching on back of mirror.  Pt able to match cards by color only without cues, requires max question cues to match by number and suit.  Dynamic sitting balance focus on matching number and suit of cards and pt able to complete with min verbal cues.  PT instructed pt in L/R, alternating, and cross toe taps on 4" step 10 reps each foot for balance with supervision progress with min assist with increasing challenge.  Gait training x 150' with close supervision and Rw, pt demonstrating increasing difficulty with RLE control with fatigue.  Pt performed car transfer with supervision and verbal cues.  PT instructed pt in ramp negotiation with RW and close supervision with verbal cues for sequencing of stepping onto/off of ramp.  Gait training x225' +150' with RW and close supervision with verbal cues for self selecting speed and taking a rest break.  Pt returned to room at end of session and performed toilet transfer and toileting tasks with distant supervision.  Pt seated in w/c at end of session and left with call bell in reach and needs met.  Session 2: Pt received resting in w/c with no c/o pain and agreeable to therapy session.  Gait training 2x150' to and  from therapy gym with RW and supervision.  NMR on kinetron x5 minutes at 40 cm/s in seated position, standing kinetron x1 min at 30 cm/s, and 10x sit<>stand focus on equal weight bearing through LEs at 20 cm/s with steady assist.  Rest breaks between each trial. Pt returned to room at end of session and positioned self in bed with call bell in reach and needs met.   Therapy Documentation Precautions:  Precautions Precautions: Fall Precaution Comments: right sided weakness and dyscoordination Required Braces or Orthoses: Knee Immobilizer - Right Knee Immobilizer - Right:  (when walking) Restrictions Weight Bearing Restrictions: No   See Function Navigator for Current Functional Status.   Therapy/Group: Individual Therapy  Earnest Conroy Penven-Crew 09/25/2015, 9:18 AM

## 2015-09-25 NOTE — Progress Notes (Signed)
[name] [name] PHYSICAL MEDICINE & REHABILITATION     PROGRESS NOTE  Subjective/Complaints:  Patient seen sitting up in her chair this morning. She continues to be in good spirits. Her room is noted to be very warm and when asked about that, she states that she thought it was just her. Her temperature was sent to 80 and she prefers at 65.  ROS:  Denies CP, SOB, n/v/d.  Objective: Vital Signs: Blood pressure 121/52, pulse 73, temperature 98 F (36.7 C), temperature source Oral, resp. rate 18, weight 66.724 kg (147 lb 1.6 oz), SpO2 100 %. No results found.  Recent Labs  09/23/15 0556 09/25/15 0442  WBC 11.6* 13.5*  HGB 12.4 12.0  HCT 36.5 35.3*  PLT 390 412*    Recent Labs  09/23/15 0556 09/25/15 0442  NA 136 133*  K 4.7 5.0  CL 101 103  GLUCOSE 115* 127*  BUN 20 15  CREATININE 0.62 0.72  CALCIUM 12.4* 12.0*   CBG (last 3)  No results for input(s): GLUCAP in the last 72 hours.  Wt Readings from Last 3 Encounters:  09/24/15 66.724 kg (147 lb 1.6 oz)  09/13/15 67.132 kg (148 lb)  08/01/15 70.489 kg (155 lb 6.4 oz)    Physical Exam:  BP 121/52 mmHg  Pulse 73  Temp(Src) 98 F (36.7 C) (Oral)  Resp 18  Wt 66.724 kg (147 lb 1.6 oz)  SpO2 100%  Constitutional: She appears well-developed. NAD. Vital signs reviewed.  HENT: Normocephalic and atraumatic.  Mouth: Oral ulcers Eyes: Conjunctivae and EOM are normal.  Cardiovascular: Normal rate and regular rhythm.No murmur heard. Respiratory: Effort normal and breath sounds normal. No stridor. No respiratory distress. She has no wheezes.  GI: Soft. Bowel sounds are normal. She exhibits no distension. There is no tenderness.  Musculoskeletal: She exhibits no edema or tenderness.  Neurological: She is alert and oriented. Speech clear.  Follows commands without difficulty.  Motor LUE/LLE 5/5.   RUE: 5/5 proximal to distal RLE: HF 4+/5, KE 4+/5, PF/DF 4+/5.  Skin: Skin is warm and dry. Area of induration along the  dorsal forearm (improving) Psychiatric: She has a normal mood and affect. Her behavior is normal. Judgment and thought content normal  Assessment/Plan: 1. 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 Physiatrist is providing close team supervision and 24 hour management of active medical problems listed below. Physiatrist and rehab team continue to assess barriers to discharge/monitor patient progress toward functional and medical goals.  Function:  Bathing Bathing position   Position: Shower  Bathing parts Body parts bathed by patient: Right arm, Left arm, Chest, Abdomen, Front perineal area, Buttocks, Right upper leg, Left upper leg, Right lower leg, Left lower leg Body parts bathed by helper: Right lower leg, Left lower leg  Bathing assist Assist Level: Touching or steadying assistance(Pt > 75%)   Set up : To obtain items  Upper Body Dressing/Undressing Upper body dressing   What is the patient wearing?: Bra, Pull over shirt/dress Bra - Perfomed by patient: Thread/unthread right bra strap, Thread/unthread left bra strap, Hook/unhook bra (pull down sports bra)   Pull over shirt/dress - Perfomed by patient: Thread/unthread right sleeve, Thread/unthread left sleeve, Put head through opening, Pull shirt over trunk          Upper body assist Assist Level: Set up   Set up : To obtain clothing/put away  Lower  Body Dressing/Undressing Lower body dressing   What is the patient wearing?: Underwear, Pants, [employer], Shoes Underwear - Performed by patient: Thread/unthread left underwear leg, Pull underwear up/down, Thread/unthread right underwear leg Underwear - Performed by helper: Thread/unthread right underwear leg Pants- Performed by patient: Thread/unthread left pants leg, Pull pants up/down, Thread/unthread right pants leg   Non-skid slipper socks-  Performed by patient: Don/doff left sock Non-skid slipper socks- Performed by helper: Don/doff right sock     Shoes - Performed by patient: Don/doff left shoe, Fasten right, Fasten left, Don/doff right shoe Shoes - Performed by helper: Don/doff right shoe     TED Hose - Performed by patient: Don/doff right TED hose, Don/doff left TED hose TED Hose - Performed by helper: Don/doff right TED hose, Don/doff left TED hose  Lower body assist Assist for lower body dressing: Touching or steadying assistance (Pt > 75%)      Toileting Toileting   Toileting steps completed by patient: Adjust clothing prior to toileting, Performs perineal hygiene, Adjust clothing after toileting Toileting steps completed by helper: Adjust clothing prior to toileting Toileting Assistive Devices: Grab bar or rail  Toileting assist Assist level: More than reasonable time   Transfers Chair/bed transfer   Chair/bed transfer method: Squat pivot Chair/bed transfer assist level: Supervision or verbal cues Chair/bed transfer assistive device: Armrests, Medical sales representative     Max distance: 220 Assist level: Supervision or verbal cues   Wheelchair   Type: Manual Max wheelchair distance: 150 Assist Level: Supervision or verbal cues  Cognition Comprehension Comprehension assist level: Understands complex 90% of the time/cues 10% of the time  Expression Expression assist level: Expresses complex 90% of the time/cues < 10% of the time  Social Interaction Social Interaction assist level: Interacts appropriately with others with medication or extra time (anti-anxiety, antidepressant).  Problem Solving Problem solving assist level: Solves complex 90% of the time/cues < 10% of the time  Memory Memory assist level: Recognizes or recalls 90% of the time/requires cueing < 10% of the time     Medical Problem List and Plan: 1. Right side weakness with ataxia secondary to left brain metastatic mass from  colon CA. Follow-up per radiation oncology Dr. Patric Dykes and oncology services Dr. Burr Medico admitted on 3/15.  Continue decadron 9 mg bid chronically until seen by Oncologist S/p stereotactic treatment on 09/18/15  Per Rad/Onc, pt to follow up in ~4/16 2.PE: Xarelto 3. Pain Management:   Pain well controlled   DCed Neurontin and baclofen on 3/22. 4. Constipation. Laxative assistance 5. Neuropsych: This patient is capable of making decisions on her own behalf. 6. Skin/Wound Care: Routine skin checks 7. Fluids/Electrolytes/Nutrition: Routine I&O 8. Hypercalcemia:   IV pamidronate on 3/22  Ca++ 12.0 on 3/23 9. Anemia: Cont to monitor, consider transfusion if necessary.  Hb 12.0 on 3/23   Will order labs for tomorrow 10. Tachycardia: Continue to monitor with increased activity. Consider medications if necessary 11.  HTN: Resolved  Overall well controlled without medications  Will cont to monitor 12. Recurrent aphthous stomatitis  Fluocinonide QID until ulcer healed 13. Leukocytosis: Trending up, afebrile  Likely cellulitis right dorsal forearm +/- UTI  Started Bactrim on 3/21  UA with few bacteria, Ucx reorder pending  Will order labs for tomorrow  Will order CXR today as well  Days) 8 A FACE TO FACE EVALUATION WAS PERFORMED  Brinae Woods Lorie Phenix 09/25/2015 9:35 AM

## 2015-09-25 NOTE — Progress Notes (Signed)
Occupational Therapy Session Note  Patient Details  Name: [name] [name] MRN: [id number] Date of Birth: 1951-05-23  Today's Date: 09/25/2015 OT Individual Time: [id number] OT Individual Time Calculation (min): 30 min    Skilled Therapeutic Interventions/Progress Updates: Patient participated in skilled OT as follows:   Supine with HOB elevated to EOB=mod I; toilet transfer via RW = close S;  toileting including managing surgical panties and hospital gown= close S; also patient participated in seated endurance activities unsupported & tolerated well  Patient was able to accurately use her right arm for bilateral tasks and self cleansing with mod I while seated or squatted above toilet;   This session patient exhibited fair  Dynamic standing balance for about 3 minutes  Patient was assisted back to bed with call bell in place at the end of the session     Therapy Documentation Precautions:  Precautions Precautions: Fall Precaution Comments: right sided weakness and dyscoordination Required Braces or Orthoses: Knee Immobilizer - Right Knee Immobilizer - Right:  (when walking) Restrictions Weight Bearing Restrictions: No Pain:denied    See Function Navigator for Current Functional Status.   Therapy/Group: Individual Therapy  Alfredia Ferguson [facility] 09/25/2015, 8:17 PM

## 2015-09-25 NOTE — Telephone Encounter (Signed)
Call received by [facility] from pt wanting to inform Dr Burr Medico she is currently still in the hospital.  Pt states she is scheduled for an office visit with Dr Burr Medico this Friday.  This note will be sent to Dr Burr Medico for appropriate follow up

## 2015-09-26 ENCOUNTER — Inpatient Hospital Stay (HOSPITAL_COMMUNITY): Payer: Medicaid Other | Admitting: Occupational Therapy

## 2015-09-26 ENCOUNTER — Inpatient Hospital Stay (HOSPITAL_COMMUNITY): Payer: Medicaid Other | Admitting: Speech Pathology

## 2015-09-26 ENCOUNTER — Ambulatory Visit: Payer: Medicaid Other | Admitting: Hematology

## 2015-09-26 ENCOUNTER — Inpatient Hospital Stay (HOSPITAL_COMMUNITY): Payer: Medicaid Other | Admitting: Physical Therapy

## 2015-09-26 ENCOUNTER — Other Ambulatory Visit: Payer: Medicaid Other

## 2015-09-26 LAB — BASIC METABOLIC PANEL
Anion gap: 8 (ref 5–15)
BUN: 16 mg/dL (ref 6–20)
CALCIUM: 12 mg/dL — AB (ref 8.9–10.3)
CO2: 22 mmol/L (ref 22–32)
CREATININE: 0.79 mg/dL (ref 0.44–1.00)
Chloride: 102 mmol/L (ref 101–111)
GFR calc Af Amer: >60 mL/min (ref >60)
GLUCOSE: 114 mg/dL — AB (ref 65–99)
POTASSIUM: 4.9 mmol/L (ref 3.5–5.1)
SODIUM: 132 mmol/L — AB (ref 135–145)

## 2015-09-26 LAB — CBC WITH DIFFERENTIAL/PLATELET
Basophils Absolute: 0 10*3/uL (ref 0.0–0.1)
Basophils Relative: 0 %
EOS ABS: 0 10*3/uL (ref 0.0–0.7)
EOS PCT: 0 %
HCT: 38.5 % (ref 36.0–46.0)
Hemoglobin: 12.7 g/dL (ref 12.0–15.0)
LYMPHS ABS: 0.7 10*3/uL (ref 0.7–4.0)
Lymphocytes Relative: 5 %
MCH: 27 pg (ref 26.0–34.0)
MCHC: 33 g/dL (ref 30.0–36.0)
MCV: 81.7 fL (ref 78.0–100.0)
MONO ABS: 0.6 10*3/uL (ref 0.1–1.0)
Monocytes Relative: 4 %
Neutro Abs: 12.7 10*3/uL — ABNORMAL HIGH (ref 1.7–7.7)
Neutrophils Relative %: 91 %
PLATELETS: 387 10*3/uL (ref 150–400)
RBC: 4.71 MIL/uL (ref 3.87–5.11)
RDW: 14.5 % (ref 11.5–15.5)
WBC: 14 10*3/uL — AB (ref 4.0–10.5)

## 2015-09-26 NOTE — Progress Notes (Signed)
Social Work Patient ID: [name] [name], female   DOB: 04-08-1951, 65 y.o.   MRN: [id number]   CSW met with pt on 09-25-15 to update her on team conference discussion.  Pt is pleased to know that she has a d/c date set and feels she will be ready on 10-01-15.  She remains positive and hopeful about her recovery.  She and her husband talked very openly about her diagnosis and they promised each other to fully disclose to one another and not "protect" each other.  Pt stated that husband is a [name] manager and "has a lot on him", but she will be up front with him from now on.  Pt stated that someone will be her when her husband is working.  CSW offered for therapists to provide family education to another caregiver, as husband has already received some training.  Pt will think if there is anyone else who needs to be trained and let CSW know.  Pt will likely leave later in the day on Wednesday due to all family working that day.  CSW remains available as needed and will set up f/u therapies and order needed DME.

## 2015-09-26 NOTE — Progress Notes (Signed)
Occupational Therapy Session Note  Patient Details  Name: [name] [name] MRN: [id number] Date of Birth: [birth year]  Today's Date: 09/26/2015 OT Individual Time: 0900-1000 OT Individual Time Calculation (min): 60 min    Short Term Goals: Week 2:  OT Short Term Goal 1 (Week 2): STG=LTG  Skilled Therapeutic Interventions/Progress Updates:    1:1 Pt reported nursing set her up at the sink and she performed bathing and dressing at sink.  Discussed showering tomorrow with therapy. Engaged in propelling w/c with bilateral feet mod I from room to the gym to address bilateral LE coordination and activity tolerance.  Performed dynamic standing balance toe tapping on top of orange cone; maintaining standing balance, weight shifts, sustained activation in right LE etc.  Performed floor transfer and provided fall education.  Able to perform transitional movements with min A with instructional cues for success and safety. Functional ambulation throughout gym and hallway without RW with min A with min cues for sustained right activity in LE when advancing left LE. Sat on therapy ball statically and dynamically to address balance reactions and core/trunk stability. Transitioned to kinetron in standing with UE support to further addressing LE coordination and smoothness of weight shifting. Side stepping in squatting position to address quad strengthening while scooting block along floor with right LE. Nustep for 5 min on level 6 resistance for endurance. Left pt with next therapist.  Occupational Therapy Weekly Progress Note  Patient Details  Name: [name] [name] MRN: [id number] Date of Birth: [birth year]  Beginning of progress report period: September 18, 2015 End of progress report period: September 26, 2015  Patient has met 5 of 5 short term goals.  Pt is overall supervision to steadying A for her ADL and basic mobility with RW. Pt is on target to meet her LTG for a planned d/c on Wednesday the 29th. Pt's right  UE is functioning at dominant level mod I - I.   Patient continues to demonstrate the following deficits: muscle weakness, impaired timing and sequencing and decreased coordination, decreased awareness and decreased problem solving and decreased standing balance, hemiplegia and decreased balance strategies and therefore will continue to benefit from skilled OT intervention to enhance overall performance with BADL and iADL.  Patient progressing toward long term goals..  Continue plan of care.  OT Short Term Goals Week 1:  OT Short Term Goal 1 (Week 1): Pt will maintaining standing balance with even weight distribution through LEs during a functional task with min A OT Short Term Goal 1 - Progress (Week 1): Met OT Short Term Goal 2 (Week 1): Pt will setup w/c in prep for transfer with mod VC (supervision) OT Short Term Goal 2 - Progress (Week 1): Met OT Short Term Goal 3 (Week 1): Pt will perform 3/3 toileting tasks with close supervision OT Short Term Goal 3 - Progress (Week 1): Met OT Short Term Goal 4 (Week 1): Pt will use right Ue at dominant level witih mod VC throughout a 60 min session OT Short Term Goal 4 - Progress (Week 1): Met Week 2:  OT Short Term Goal 1 (Week 2): STG=LTG  Skilled Therapeutic Interventions/Progress Updates:      Therapy Documentation Precautions:  Precautions Precautions: Fall Precaution Comments: right sided weakness and dyscoordination Required Braces or Orthoses: Knee Immobilizer - Right Knee Immobilizer - Right:  (when walking) Restrictions Weight Bearing Restrictions: No   Willeen Cass [facility] 09/26/2015, 10:54 AM    Therapy Documentation Precautions:  Precautions Precautions: Fall Precaution Comments:  right sided weakness and dyscoordination Required Braces or Orthoses: Knee Immobilizer - Right Knee Immobilizer - Right:  (when walking) Restrictions Weight Bearing Restrictions: No Pain:  no c/o pain  ADL: ADL ADL Comments: see  functional navigator  See Function Navigator for Current Functional Status.   Therapy/Group: Individual Therapy  Willeen Cass [facility] 09/26/2015, 10:45 AM

## 2015-09-26 NOTE — Progress Notes (Signed)
Speech Language Pathology Daily Session Note  Patient Details  Name: [name] [name] MRN: [id number] Date of Birth: 1951/03/01  Today's Date: 09/26/2015 SLP Individual Time: 1130-1200 SLP Individual Time Calculation (min): 30 min  Short Term Goals: Week 1: SLP Short Term Goal 1 (Week 1): Patient will demonstrate functional problem solving for familiar tasks with Min A verbal and question cues.  SLP Short Term Goal 2 (Week 1): Patient will demonstrate selective attention to functional tasks in a mildly distracting enviornment with Min A verbal cues for redirection for 30 minutes.  SLP Short Term Goal 3 (Week 1): Patient will attend to RUE and RLE during functional tasks with Min A verbal and question cues.  SLP Short Term Goal 4 (Week 1): Patient will utilize external memory aids to recall new, daily information with Min A verbal and question cues.   Skilled Therapeutic Interventions: Skilled ST session focused on cognition goals. SLP facilitated the session with a functional problem solving calendar task. Pt with min A to mod I supervision with extra time needed for pt to self-rehearse steps and self-correction of information multiple times (for organization of information). Pt able to attend to right with mod I and utilize memory strategies with mod I ability. Pt left with all needs within reach. Continue current plan of care.   Function:  Eating Eating                 Cognition Comprehension Comprehension assist level: Understands complex 90% of the time/cues 10% of the time  Expression   Expression assist level: Expresses complex 90% of the time/cues < 10% of the time  Social Interaction Social Interaction assist level: Interacts appropriately with others with medication or extra time (anti-anxiety, antidepressant).  Problem Solving Problem solving assist level: Solves complex 90% of the time/cues < 10% of the time  Memory Memory assist level: Recognizes or recalls 90% of the  time/requires cueing < 10% of the time    Pain    Therapy/Group: Individual Therapy  Dominique Ressel 09/26/2015, 12:48 PM

## 2015-09-26 NOTE — Progress Notes (Signed)
[facility]     PROGRESS NOTE  Subjective/Complaints:  Patient seen this morning sitting up in her chair. She is in good spirits as usual. She notes decreased tenderness in the left forearm and in her mouth.  ROS:  Denies CP, SOB, n/v/d.  Objective: Vital Signs: Blood pressure 115/54, pulse 74, temperature 98 F (36.7 C), temperature source Oral, resp. rate 16, weight 66.724 kg (147 lb 1.6 oz), SpO2 99 %. Dg Chest Port 1 View  09/25/2015  CLINICAL DATA:  Leukocytosis. Fall 2 weeks ago. Stage for colon cancer. Nodules on chest x-ray from 2014. EXAM: PORTABLE CHEST 1 VIEW COMPARISON:  01/01/2015 FINDINGS: Right IJ Port-A-Cath unchanged. Lungs are adequately inflated without consolidation or effusion. Cardiomediastinal silhouette is within normal. Minimal curvature of the thoracic spine convex right with mild degenerative change. IMPRESSION: No active disease. Electronically Signed   By: Marin Olp M.D.   On: 09/25/2015 11:10    Recent Labs  09/25/15 0442 09/26/15 0824  WBC 13.5* 14.0*  HGB 12.0 12.7  HCT 35.3* 38.5  PLT 412* 387    Recent Labs  09/25/15 0442  NA 133*  K 5.0  CL 103  GLUCOSE 127*  BUN 15  CREATININE 0.72  CALCIUM 12.0*   CBG (last 3)  No results for input(s): GLUCAP in the last 72 hours.  Wt Readings from Last 3 Encounters:  09/24/15 66.724 kg (147 lb 1.6 oz)  09/13/15 67.132 kg (148 lb)  08/01/15 70.489 kg (155 lb 6.4 oz)    Physical Exam:  BP 115/54 mmHg  Pulse 74  Temp(Src) 98 F (36.7 C) (Oral)  Resp 16  Wt 66.724 kg (147 lb 1.6 oz)  SpO2 99%  Constitutional: She appears well-developed. NAD. Vital signs reviewed.  HENT: Normocephalic and atraumatic.  Mouth: Oral ulcers, Almost healed Eyes: Conjunctivae and EOM are normal.  Cardiovascular: Normal rate and regular rhythm.No murmur heard. Respiratory: Effort normal and breath sounds normal. No stridor. No respiratory distress. She has no wheezes.   GI: Soft. Bowel sounds are normal. She exhibits no distension. There is no tenderness.  Musculoskeletal: She exhibits no edema or tenderness.  Neurological: She is alert and oriented. Speech clear.  Follows commands without difficulty.  Motor LUE/LLE 5/5.   RUE: 5/5 proximal to distal RLE: HF 4+/5, KE 4+/5, PF/DF 4+/5.  Skin: Skin is warm and dry. Area of induration along the dorsal forearm (improving) Psychiatric: She has a normal mood and affect. Her behavior is normal. Judgment and thought content normal  Assessment/Plan: 1. 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 Physiatrist is providing close team supervision and 24 hour management of active medical problems listed below. Physiatrist and rehab team continue to assess barriers to discharge/monitor patient progress toward functional and medical goals.  Function:  Bathing Bathing position   Position: Shower  Bathing parts Body parts bathed by patient: Right arm, Left arm, Chest, Abdomen, Front perineal area, Buttocks, Right upper leg, Left upper leg, Right lower leg, Left lower leg Body parts bathed by helper: Right lower leg, Left lower leg  Bathing assist Assist Level: Touching or steadying assistance(Pt > 75%)   Set up : To obtain items  Upper Body Dressing/Undressing Upper body dressing   What is the patient wearing?: Bra, Pull over shirt/dress Bra - Perfomed by patient: Thread/unthread right bra strap, Thread/unthread left bra strap, Hook/unhook bra (pull  down sports bra)   Pull over shirt/dress - Perfomed by patient: Thread/unthread right sleeve, Thread/unthread left sleeve, Put head through opening, Pull shirt over trunk          Upper body assist Assist Level: Set up   Set up : To obtain clothing/put away  Lower Body Dressing/Undressing Lower body dressing   What is the  patient wearing?: Underwear, Pants, [employer], Shoes Underwear - Performed by patient: Thread/unthread left underwear leg, Pull underwear up/down, Thread/unthread right underwear leg Underwear - Performed by helper: Thread/unthread right underwear leg Pants- Performed by patient: Thread/unthread left pants leg, Pull pants up/down, Thread/unthread right pants leg   Non-skid slipper socks- Performed by patient: Don/doff left sock Non-skid slipper socks- Performed by helper: Don/doff right sock     Shoes - Performed by patient: Don/doff left shoe, Fasten right, Fasten left, Don/doff right shoe Shoes - Performed by helper: Don/doff right shoe     TED Hose - Performed by patient: Don/doff right TED hose, Don/doff left TED hose TED Hose - Performed by helper: Don/doff right TED hose, Don/doff left TED hose  Lower body assist Assist for lower body dressing: Touching or steadying assistance (Pt > 75%)      Toileting Toileting   Toileting steps completed by patient: Adjust clothing prior to toileting, Performs perineal hygiene, Adjust clothing after toileting Toileting steps completed by helper: Adjust clothing prior to toileting Toileting Assistive Devices: Grab bar or rail  Toileting assist Assist level: More than reasonable time   Transfers Chair/bed transfer   Chair/bed transfer method: Ambulatory Chair/bed transfer assist level: Supervision or verbal cues Chair/bed transfer assistive device: Armrests, Medical sales representative     Max distance: 170 Assist level: Supervision or verbal cues   Wheelchair   Type: Manual Max wheelchair distance: 150 Assist Level: Supervision or verbal cues  Cognition Comprehension Comprehension assist level: Follows complex conversation/direction with no assist  Expression Expression assist level: Expresses complex ideas: With no assist  Social Interaction Social Interaction assist level: Interacts appropriately with others - No medications  needed.  Problem Solving Problem solving assist level: Solves complex problems: Recognizes & self-corrects  Memory Memory assist level: Complete Independence: No helper     Medical Problem List and Plan: 1. 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 Follow-up per radiation oncology Dr. Patric Dykes and oncology services Dr. Burr Medico admitted on 3/15.  Continue decadron 9 mg bid chronically until seen by Oncologist S/p stereotactic treatment on 09/18/15  Per Rad/Onc, pt to follow up in ~4/16 2.PE: Xarelto 3. Pain Management:   Pain well controlled   DCed Neurontin and baclofen on 3/22. 4. Constipation. Laxative assistance 5. Neuropsych: This patient is capable of making decisions on her own behalf. 6. Skin/Wound Care: Routine skin checks 7. Fluids/Electrolytes/Nutrition: Routine I&O 8. Hypercalcemia:   IV pamidronate on 3/22  Ca++ 12.0 on 3/23 9. Anemia: Cont to monitor, consider transfusion if necessary.  Hb 12.7 on 3/24  10. Tachycardia: Continue to monitor with increased activity. Consider medications if necessary 11.  HTN: Resolved  Overall well controlled without medications  Will cont to monitor 12. Recurrent aphthous stomatitis  Fluocinonide QID until ulcer healed (almost healed) 13. Leukocytosis: Trending up -slowing down and no signs or symptoms of infection, afebrile  Likely cellulitis right dorsal forearm  Started Bactrim on 3/21  UA with few bacteria, Ucx with insignificant growth on 3/22  CXR reviewed from 3/23, unremarkable pulmonary disease  Will continue to monitor   Days) 9 A FACE TO FACE EVALUATION WAS PERFORMED  Ankit Lorie Phenix 09/26/2015 9:35 AM

## 2015-09-26 NOTE — Progress Notes (Signed)
Speech Language Pathology Weekly Progress and Session Note  Patient Details  Name: [name] [name] MRN: [id number] Date of Birth: 07-25-1950  Beginning of progress report period: September 19, 2015 End of progress report period: September 26, 2015  Today's Date: 09/26/2015 SLP Individual Time: 1445-1545 SLP Individual Time Calculation (min): 60 min  Short Term Goals: Week 1: SLP Short Term Goal 1 (Week 1): Patient will demonstrate functional problem solving for familiar tasks with Min A verbal and question cues.  SLP Short Term Goal 1 - Progress (Week 1): Met SLP Short Term Goal 2 (Week 1): Patient will demonstrate selective attention to functional tasks in a mildly distracting enviornment with Min A verbal cues for redirection for 30 minutes.  SLP Short Term Goal 2 - Progress (Week 1): Met SLP Short Term Goal 3 (Week 1): Patient will attend to RUE and RLE during functional tasks with Min A verbal and question cues.  SLP Short Term Goal 3 - Progress (Week 1): Met SLP Short Term Goal 4 (Week 1): Patient will utilize external memory aids to recall new, daily information with Min A verbal and question cues.  SLP Short Term Goal 4 - Progress (Week 1): Met    New Short Term Goals: Week 2: SLP Short Term Goal 1 (Week 2): Pt demonstrate completion of moderate level reasoning tasks with min A SLP Short Term Goal 2 (Week 2): Pt complete functional math tasks with min A SLP Short Term Goal 3 (Week 2): Pt complete medication management tasks and other similar organization tasks with min A SLP Short Term Goal 4 (Week 2): Pt verbalize cognitive limitations/precautions with min A of questioning cues.  Weekly Progress Updates:     Intensity: Minumum of 1-2 x/day, 30 to 90 minutes Frequency: 3 to 5 out of 7 days Duration/Length of Stay: 10-12 days  Treatment/Interventions: Cognitive remediation/compensation;Cueing hierarchy;Functional tasks;Patient/family education;Internal/external aids;Environmental  controls;Therapeutic Activities   Daily Session  Skilled Therapeutic Interventions: Pt completed complex math reasoning tasks with min A for prioritization and sequencing of information needed to resolve question. Pt benefited from reading the question 2 x prior to beginning and stating what her plan was for solving the question. Pt able to draw relationships between the therapy task and real-life instances when this skill is needed.      Function:     Cognition Comprehension Comprehension assist level: Understands complex 90% of the time/cues 10% of the time  Expression   Expression assist level: Expresses complex 90% of the time/cues < 10% of the time  Social Interaction Social Interaction assist level: Interacts appropriately with others with medication or extra time (anti-anxiety, antidepressant).  Problem Solving Problem solving assist level: Solves basic problems with no assist  Memory Memory assist level: Recognizes or recalls 90% of the time/requires cueing < 10% of the time   General    Pain Pain Assessment Pain Assessment: No/denies pain  Therapy/Group: Individual Therapy  Vinetta Bergamo 09/26/2015, 4:38 PM

## 2015-09-26 NOTE — Progress Notes (Signed)
Physical Therapy Note  Patient Details  Name: [name] [name] MRN: [id number] Date of Birth: [birth year] Today's Date: 09/26/2015    Time: 1000-1055 55 minutes  1:1 No c/o pain. Pt performed gait with RW throughout unit on tile and carpeted surfaces with supervision, good obstacle negotiation. Gait without RW for balance training 2 x 110' with min A, pt improved with cues for posture and tactile cues for trunk control.  Gait sideways and backwards with min A, pt fatigues easily in L LE with side stepping.  Standing balance training with dynavision with random program x 4 minutes, then 3 x 1 minute with lights flashing every 1.5 seconds. Pt improved reaction time and accuracy with each repetition.  Therex for LE strengthening x 20 marching, sit to stand without UE support, LAQ with 5 second hold. Pt states she is pleased with progress on rehab.   Harrison Zetina 09/26/2015, 11:13 AM

## 2015-09-27 ENCOUNTER — Inpatient Hospital Stay (HOSPITAL_COMMUNITY): Payer: Medicaid Other | Admitting: Physical Therapy

## 2015-09-27 NOTE — Progress Notes (Signed)
Physical Therapy Session Note  Patient Details  Name: [name] [name] MRN: [id number] Date of Birth: 09-18-1950  Today's Date: 09/27/2015 PT Individual Time: 1300-1341 PT Individual Time Calculation (min): 41 min   Short Term Goals: Week 2:  PT Short Term Goal 1 (Week 2): =LTGs  Skilled Therapeutic Interventions/Progress Updates:    Pt received resting in w/c with no c/o pain and agreeable to therapy session.  Session focus on gait without assistive device.  Pt amb to therapy gym with RW and close supervision.  Obstacle course x4 with initial steady assist fade to close supervision with practice with compliant surfaces and stepping over/around obstacles.  Gait training without AD from therapy gym>dayroom>west elevators>therapy apartment with close supervision and occasional steady assist when fatigued.  PT provided pt education on importance of rest breaks and encouraged pt to take them whenever she noticed her RLE was getting fatigued.  Pt verbalized understanding.  High level gait training with HHA for tandem walking, side stepping, and backwards walking from therapy apartment back to pt's room.  Pt positioned in w/c with call bell in reach and needs met at end of session.   Therapy Documentation Precautions:  Precautions Precautions: Fall Precaution Comments: right sided weakness and dyscoordination Required Braces or Orthoses: Knee Immobilizer - Right Knee Immobilizer - Right:  (when walking) Restrictions Weight Bearing Restrictions: No   See Function Navigator for Current Functional Status.   Therapy/Group: Individual Therapy  Earnest Conroy Penven-Crew 09/27/2015, 3:36 PM

## 2015-09-27 NOTE — Progress Notes (Signed)
[name] [name] is a 65 y.o. female Jan 04, 1951 [id number]  Subjective: Positive outlook and upbeat - feels very well Reports strength and numbness much improved on R side - looking forward to DC home on Wed  Objective: Vital signs in last 24 hours: Temp:  [98 F (36.7 C)-98.2 F (36.8 C)] 98 F (36.7 C) (03/25 0528) Pulse Rate:  [67-89] 67 (03/25 0528) Resp:  [18] 18 (03/25 0528) BP: (107-127)/(52) 107/52 mmHg (03/25 0528) SpO2:  [100 %] 100 % (03/25 0528) Weight change:  Last BM Date: 09/25/15  Intake/Output from previous day: 03/24 0701 - 03/25 0700 In: 720 [P.O.:720] Out: -   Physical Exam General: No apparent distress    Lungs: Normal effort. Lungs clear to auscultation, no crackles or wheezes. Cardiovascular: Regular rate and rhythm, no edema Neurological: No new neurological deficits   Lab Results: BMET    Component Value Date/Time   NA 132* 09/26/2015 0824   NA 136 08/29/2015 1301   K 4.9 09/26/2015 0824   K 4.2 08/29/2015 1301   CL 102 09/26/2015 0824   CO2 22 09/26/2015 0824   CO2 26 08/29/2015 1301   GLUCOSE 114* 09/26/2015 0824   GLUCOSE 90 08/29/2015 1301   BUN 16 09/26/2015 0824   BUN 10.2 08/29/2015 1301   CREATININE 0.79 09/26/2015 0824   CREATININE 0.7 08/29/2015 1301   CALCIUM 12.0* 09/26/2015 0824   CALCIUM 11.4* 08/29/2015 1301   GFRNONAA >60 09/26/2015 0824   GFRAA >60 09/26/2015 0824   CBC    Component Value Date/Time   WBC 14.0* 09/26/2015 0824   WBC 4.1 08/29/2015 1301   RBC 4.71 09/26/2015 0824   RBC 3.98 08/29/2015 1301   RBC 3.85* 10/27/2014 1020   HGB 12.7 09/26/2015 0824   HGB 10.8* 08/29/2015 1301   HCT 38.5 09/26/2015 0824   HCT 33.0* 08/29/2015 1301   PLT 387 09/26/2015 0824   PLT 353 08/29/2015 1301   MCV 81.7 09/26/2015 0824   MCV 82.9 08/29/2015 1301   MCH 27.0 09/26/2015 0824   MCH 27.1 08/29/2015 1301   MCHC 33.0 09/26/2015 0824   MCHC 32.7 08/29/2015 1301   RDW 14.5 09/26/2015 0824   RDW 13.9 08/29/2015  1301   LYMPHSABS 0.7 09/26/2015 0824   LYMPHSABS 1.8 08/29/2015 1301   MONOABS 0.6 09/26/2015 0824   MONOABS 0.3 08/29/2015 1301   EOSABS 0.0 09/26/2015 0824   EOSABS 0.2 08/29/2015 1301   BASOSABS 0.0 09/26/2015 0824   BASOSABS 0.0 08/29/2015 1301   CBG's (last 3):  No results for input(s): GLUCAP in the last 72 hours. LFT's Lab Results  Component Value Date   ALT 50 09/18/2015   AST 31 09/18/2015   ALKPHOS 50 09/18/2015   BILITOT 0.2* 09/18/2015    Studies/Results: No results found.  Medications:  I have reviewed the patient's current medications. Scheduled Medications: . dexamethasone  10 mg Oral Q12H  . fluocinonide gel   Topical QID  . pantoprazole  40 mg Oral Q0600  . polyethylene glycol  17 g Oral Daily  . rivaroxaban  20 mg Oral Q supper  . sulfamethoxazole-trimethoprim  1 tablet Oral Q12H  . vitamin B-12  100 mcg Oral Daily   PRN Medications: acetaminophen, ondansetron **OR** ondansetron (ZOFRAN) IV, sorbitol  Assessment/Plan: Active Problems:   Brain metastasis (HCC)   Abnormality of gait   Hemiparesis (HCC)   Acute septic pulmonary embolism without acute cor pulmonale (HCC)   Benign essential HTN   Recurrent aphthous stomatitis  Leukocytosis   Cellulitis of right upper extremity   Absolute anemia   Hypercalcemia of malignancy  1. 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 Follow-up per radiation oncology Dr. Patric Dykes and oncology services Dr. Burr Medico admitted on 3/15.  Continue decadron 9 mg bid chronically until seen by Oncologist S/p stereotactic treatment on 09/18/15 Per Rad/Onc, pt to follow up in ~4/16 2.PE: Xarelto 3. Pain Management:  Pain well controlled  DCed Neurontin and baclofen on 3/22. 4. Constipation. Laxative assistance 5. Neuropsych: This patient is capable of making decisions on her own behalf. 6. Skin/Wound Care:  Routine skin checks 7. Fluids/Electrolytes/Nutrition: Routine I&O 8. Hypercalcemia:  IV pamidronate on 3/22 Ca++ 12.0 on 3/23 9. Anemia: Cont to monitor, consider transfusion if necessary. Hb 12.7 on 3/24  10. Tachycardia: Continue to monitor with increased activity. Consider medications if necessary 11. HTN: Resolved Overall well controlled without medications Will cont to monitor 12. Recurrent aphthous stomatitis Fluocinonide QID until ulcer healed (almost healed) 13. Leukocytosis: Trending up, suspect related to decadron - no signs or symptoms of infection, afebrile Potential cellulitis right dorsal forearm>>Started Bactrim on 3/21 UA with few bacteria, Ucx with insignificant growth on 3/22 CXR reviewed from 3/23, unremarkable pulmonary disease  Will continue to monitor  Length of stay, days: 10   Valerie A. Asa Lente, MD 09/27/2015, 10:25 AM

## 2015-09-28 ENCOUNTER — Inpatient Hospital Stay (HOSPITAL_COMMUNITY): Payer: Medicaid Other | Admitting: Occupational Therapy

## 2015-09-28 NOTE — Progress Notes (Signed)
Occupational Therapy Session Note  Patient Details  Name: [name] [name] MRN: [id number] Date of Birth: 08/04/1950  Today's Date: 09/28/2015 OT Individual Time:  -   1030-1130  (60 min)      Short Term Goals: Week 1:  OT Short Term Goal 1 (Week 1): Pt will maintaining standing balance with even weight distribution through LEs during a functional task with min A OT Short Term Goal 1 - Progress (Week 1): Met OT Short Term Goal 2 (Week 1): Pt will setup w/c in prep for transfer with mod VC (supervision) OT Short Term Goal 2 - Progress (Week 1): Met OT Short Term Goal 3 (Week 1): Pt will perform 3/3 toileting tasks with close supervision OT Short Term Goal 3 - Progress (Week 1): Met OT Short Term Goal 4 (Week 1): Pt will use right Ue at dominant level witih mod VC throughout a 60 min session OT Short Term Goal 4 - Progress (Week 1): Met Week 2:  OT Short Term Goal 1 (Week 2): STG=LTG  Skilled Therapeutic Interventions/Progress Updates:    Focus of treatment was  transfers, standing balance, therapeutic activities, functional mobility, cognitive remediation.   Pt sitting in wc upon OT arrival.  She reported that she is going home on Wed and was very excited.  She engaged in functional mobility with RW down the hall wiith SBA.  Ppt oriented x4.  Engaged in balance activities in the gym with difficulty with motor planning and balance on right foot.  Pt able to balance on right foot for 3 seconds. And on left for 2 seconds using rails for balance.   Ambulated back to room and Pt.left in room with all items in reach.    Therapy Documentation Precautions:  Precautions Precautions: Fall Precaution Comments: right sided weakness and dyscoordination Required Braces or Orthoses: Knee Immobilizer - Right Knee Immobilizer - Right:  (when walking) Restrictions Weight Bearing Restrictions: No         Pain:  none  ADL ADL Comments: see functional navigator  See Function Navigator for  Current Functional Status.   Therapy/Group: Individual Therapy  Lisa Roca 09/28/2015, 12:08 PM

## 2015-09-28 NOTE — Progress Notes (Signed)
[name] [name] is a 65 y.o. female [birth year] [id number]  Subjective: feels very well today - optimistic and anxious for DC home this week Reports strength and numbness much improved on R side - denies pain  Objective: Vital signs in last 24 hours: Temp:  [98.1 F (36.7 C)-98.8 F (37.1 C)] 98.8 F (37.1 C) (03/26 0528) Pulse Rate:  [87-95] 95 (03/26 0528) Resp:  [16-17] 17 (03/26 0528) BP: (107-119)/(52-65) 107/65 mmHg (03/26 0528) SpO2:  [100 %] 100 % (03/26 0528) Weight change:  Last BM Date: 09/27/15  Intake/Output from previous day: 03/25 0701 - 03/26 0700 In: 840 [P.O.:840] Out: -   Physical Exam General: No apparent distress   In [facility] Lungs: Normal effort. Lungs clear to auscultation, no crackles or wheezes. Cardiovascular: Regular rate and rhythm, no edema Neurological: No new neurological deficits   Lab Results: BMET    Component Value Date/Time   NA 132* 09/26/2015 0824   NA 136 08/29/2015 1301   K 4.9 09/26/2015 0824   K 4.2 08/29/2015 1301   CL 102 09/26/2015 0824   CO2 22 09/26/2015 0824   CO2 26 08/29/2015 1301   GLUCOSE 114* 09/26/2015 0824   GLUCOSE 90 08/29/2015 1301   BUN 16 09/26/2015 0824   BUN 10.2 08/29/2015 1301   CREATININE 0.79 09/26/2015 0824   CREATININE 0.7 08/29/2015 1301   CALCIUM 12.0* 09/26/2015 0824   CALCIUM 11.4* 08/29/2015 1301   GFRNONAA >60 09/26/2015 0824   GFRAA >60 09/26/2015 0824   CBC    Component Value Date/Time   WBC 14.0* 09/26/2015 0824   WBC 4.1 08/29/2015 1301   RBC 4.71 09/26/2015 0824   RBC 3.98 08/29/2015 1301   RBC 3.85* 10/27/2014 1020   HGB 12.7 09/26/2015 0824   HGB 10.8* 08/29/2015 1301   HCT 38.5 09/26/2015 0824   HCT 33.0* 08/29/2015 1301   PLT 387 09/26/2015 0824   PLT 353 08/29/2015 1301   MCV 81.7 09/26/2015 0824   MCV 82.9 08/29/2015 1301   MCH 27.0 09/26/2015 0824   MCH 27.1 08/29/2015 1301   MCHC 33.0 09/26/2015 0824   MCHC 32.7 08/29/2015 1301   RDW 14.5 09/26/2015 0824   RDW  13.9 08/29/2015 1301   LYMPHSABS 0.7 09/26/2015 0824   LYMPHSABS 1.8 08/29/2015 1301   MONOABS 0.6 09/26/2015 0824   MONOABS 0.3 08/29/2015 1301   EOSABS 0.0 09/26/2015 0824   EOSABS 0.2 08/29/2015 1301   BASOSABS 0.0 09/26/2015 0824   BASOSABS 0.0 08/29/2015 1301   CBG's (last 3):  No results for input(s): GLUCAP in the last 72 hours. LFT's Lab Results  Component Value Date   ALT 50 09/18/2015   AST 31 09/18/2015   ALKPHOS 50 09/18/2015   BILITOT 0.2* 09/18/2015    Studies/Results: No results found.  Medications:  I have reviewed the patient's current medications. Scheduled Medications: . dexamethasone  10 mg Oral Q12H  . fluocinonide gel   Topical QID  . pantoprazole  40 mg Oral Q0600  . polyethylene glycol  17 g Oral Daily  . rivaroxaban  20 mg Oral Q supper  . vitamin B-12  100 mcg Oral Daily   PRN Medications: acetaminophen, ondansetron **OR** ondansetron (ZOFRAN) IV, sorbitol  Assessment/Plan: Active Problems:   Brain metastasis (HCC)   Abnormality of gait   Hemiparesis (HCC)   Acute septic pulmonary embolism without acute cor pulmonale (HCC)   Benign essential HTN   Recurrent aphthous stomatitis   Leukocytosis   Cellulitis of right upper  extremity   Absolute anemia   Hypercalcemia of malignancy  1. 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 Follow-up per radiation oncology Dr. Patric Dykes and oncology services Dr. Burr Medico admitted on 3/15.  Continue decadron 9 mg bid chronically until seen by Oncologist S/p stereotactic treatment on 09/18/15 Per Rad/Onc, pt to follow up in ~4/16 2.PE: Xarelto 3. Pain Management:  Pain well controlled  DCed Neurontin and baclofen on 3/22. 4. Constipation. Laxative assistance 5. Neuropsych: This patient is capable of making decisions on her own behalf. 6. Skin/Wound Care: Routine skin checks 7.  Fluids/Electrolytes/Nutrition: Routine I&O 8. Hypercalcemia:  IV pamidronate on 3/22 Ca++ 12.0 on 3/23 9. Anemia: Cont to monitor, consider transfusion if necessary. Hb 12.7 on 3/24  10. Tachycardia: Continue to monitor with increased activity. Consider medications if necessary 11. HTN: Resolved Overall well controlled without medications Will cont to monitor 12. Recurrent aphthous stomatitis Fluocinonide QID until ulcer healed (almost healed) 13. Leukocytosis: Trending up, suspect related to decadron - no signs or symptoms of infection, afebrile Potential cellulitis right dorsal forearm>>Started Bactrim on 3/21 UA with few bacteria, Ucx with insignificant growth on 3/22 CXR reviewed from 3/23, unremarkable pulmonary disease  Will continue to monitor  Length of stay, days: 11   [name] Shevlin A. Asa Lente, MD 09/28/2015, 10:56 AM

## 2015-09-29 ENCOUNTER — Encounter: Payer: Self-pay | Admitting: General Surgery

## 2015-09-29 ENCOUNTER — Inpatient Hospital Stay (HOSPITAL_COMMUNITY): Payer: Medicaid Other | Admitting: Speech Pathology

## 2015-09-29 ENCOUNTER — Inpatient Hospital Stay (HOSPITAL_COMMUNITY): Payer: Medicaid Other | Admitting: Physical Therapy

## 2015-09-29 ENCOUNTER — Inpatient Hospital Stay (HOSPITAL_COMMUNITY): Payer: Medicaid Other

## 2015-09-29 ENCOUNTER — Inpatient Hospital Stay (HOSPITAL_COMMUNITY): Payer: Medicaid Other | Admitting: Occupational Therapy

## 2015-09-29 DIAGNOSIS — E871 Hypo-osmolality and hyponatremia: Secondary | ICD-10-CM | POA: Insufficient documentation

## 2015-09-29 LAB — BASIC METABOLIC PANEL
Anion gap: 12 (ref 5–15)
BUN: 17 mg/dL (ref 6–20)
CALCIUM: 11.7 mg/dL — AB (ref 8.9–10.3)
CO2: 17 mmol/L — AB (ref 22–32)
Chloride: 103 mmol/L (ref 101–111)
Creatinine, Ser: 0.79 mg/dL (ref 0.44–1.00)
GFR calc Af Amer: >60 mL/min (ref >60)
GLUCOSE: 168 mg/dL — AB (ref 65–99)
Potassium: 4.5 mmol/L (ref 3.5–5.1)
Sodium: 132 mmol/L — ABNORMAL LOW (ref 135–145)

## 2015-09-29 LAB — CBC WITH DIFFERENTIAL/PLATELET
Basophils Absolute: 0 10*3/uL (ref 0.0–0.1)
Basophils Relative: 0 %
EOS PCT: 0 %
Eosinophils Absolute: 0 10*3/uL (ref 0.0–0.7)
HCT: 37.3 % (ref 36.0–46.0)
Hemoglobin: 12.3 g/dL (ref 12.0–15.0)
LYMPHS ABS: 0.7 10*3/uL (ref 0.7–4.0)
LYMPHS PCT: 5 %
MCH: 26.7 pg (ref 26.0–34.0)
MCHC: 33 g/dL (ref 30.0–36.0)
MCV: 81.1 fL (ref 78.0–100.0)
MONO ABS: 0.1 10*3/uL (ref 0.1–1.0)
MONOS PCT: 1 %
Neutro Abs: 11.3 10*3/uL — ABNORMAL HIGH (ref 1.7–7.7)
Neutrophils Relative %: 94 %
PLATELETS: 341 10*3/uL (ref 150–400)
RBC: 4.6 MIL/uL (ref 3.87–5.11)
RDW: 14.6 % (ref 11.5–15.5)
WBC: 12.1 10*3/uL — ABNORMAL HIGH (ref 4.0–10.5)

## 2015-09-29 MED ORDER — SULFAMETHOXAZOLE-TRIMETHOPRIM 800-160 MG PO TABS
2.0000 | ORAL_TABLET | Freq: Two times a day (BID) | ORAL | Status: DC
  Administered 2015-09-29 – 2015-10-01 (×4): 2 via ORAL
  Filled 2015-09-29 (×5): qty 2

## 2015-09-29 MED ORDER — LIDOCAINE-EPINEPHRINE (PF) 2 %-1:200000 IJ SOLN
20.0000 mL | Freq: Once | INTRAMUSCULAR | Status: AC
  Administered 2015-09-29: 20 mL
  Filled 2015-09-29: qty 20

## 2015-09-29 MED ORDER — LIDOCAINE-EPINEPHRINE (PF) 2 %-1:200000 IJ SOLN
10.0000 mL | Freq: Once | INTRAMUSCULAR | Status: DC
  Filled 2015-09-29: qty 10

## 2015-09-29 NOTE — Progress Notes (Signed)
Occupational Therapy Session Note  Patient Details  Name: [name] [name] MRN: [id number] Date of Birth: 1951/03/05  Today's Date: 09/29/2015 OT Individual Time: 0702-0800 OT Individual Time Calculation (min): 58 min    Short Term Goals: Week 2:  OT Short Term Goal 1 (Week 2): STG=LTG  Skilled Therapeutic Interventions/Progress Updates:  Upon entering the room, pt seated in bed and finishing breakfast. Pt with no c/o pain this session and agreeable to OT intervention. Pt performed stand pivot transfer into wheelchair from bed with supervision. Pt sat in wheelchair to obtain items from dresser for dressing and bathing task at sink side. Pt declined shower this session. Pt engaged in sit <>stand at sink with supervision overall for dressing and bathing. OT and pt discussed home environment set up and recommended TTB for shower at home. Pt verbalized agreement. Once dressed, pt standing from wheelchair and ambulating with RW into bathroom for toileting with supervision only. Pt performed transfer, hygiene, and clothing management without assistance and only required supervision for task. Pt returned to wheelchair at end of session. Call bell and all needed items within reach upon exiting the room.   Therapy Documentation Precautions:  Precautions Precautions: Fall Precaution Comments: right sided weakness and dyscoordination Required Braces or Orthoses: Knee Immobilizer - Right Knee Immobilizer - Right:  (when walking) Restrictions Weight Bearing Restrictions: No   ADL: ADL ADL Comments: see functional navigator  See Function Navigator for Current Functional Status.   Therapy/Group: Individual Therapy  Phineas Semen 09/29/2015, 8:59 AM

## 2015-09-29 NOTE — Progress Notes (Signed)
Occupational Therapy Note  Patient Details  Name: [name] [name] MRN: [id number] Date of Birth: 15-Nov-1950  Today's Date: 09/29/2015 OT Individual Time: 1130-1200 OT Individual Time Calculation (min): 30 min   Pt denied pain Individual Therapy  Pt resting in w/c upon arrival and agreeable to therapy.  Pt amb with RW to therapy gym and initially engaged in functional amb with RW for simulated home mgmt tasks.  Pt transitioned to dynamic standing tasks throwing ball against mini-trampoline, first on floor and then on compliant surface (foam mat). Pt amb with RW back to room and returned to w/c with all needs within reach.  Pt completed all tasks with supervision and no LOB noted.   Leotis Shames [facility] 09/29/2015, 12:03 PM

## 2015-09-29 NOTE — Progress Notes (Signed)
Occupational Therapy Weekly Progress Note  Patient Details  Name: [name] [name] MRN: [id number] Date of Birth: 04/24/1951  Beginning of progress report period: September 18, 2015 End of progress report period: September 29, 2015   Patient has met 4 of 4 short term goals. Pt making great progress this week towards occupational therapy goals. Pt utilizing R UE at dominant level without cues to do so. Pt is currently able to ambulate without need for KI on R LE as knee is no longer buckling. Pt is making significant progress in therapy sessions towards all levels of self care.  Patient continues to demonstrate the following deficits: decreased I in self care, decreased balance, decreased cognition, decreased functional transers and therefore will continue to benefit from skilled OT intervention to enhance overall performance with BADL.  Patient progressing toward long term goals..  Plan of care revisions: goals have been upgraded to supervision overall. .  OT Short Term Goals Week 1:  OT Short Term Goal 1 (Week 1): Pt will maintaining standing balance with even weight distribution through LEs during a functional task with min A OT Short Term Goal 1 - Progress (Week 1): Met OT Short Term Goal 2 (Week 1): Pt will setup w/c in prep for transfer with mod VC (supervision) OT Short Term Goal 2 - Progress (Week 1): Met OT Short Term Goal 3 (Week 1): Pt will perform 3/3 toileting tasks with close supervision OT Short Term Goal 3 - Progress (Week 1): Met OT Short Term Goal 4 (Week 1): Pt will use right Ue at dominant level witih mod VC throughout a 60 min session OT Short Term Goal 4 - Progress (Week 1): Met Week 2:  OT Short Term Goal 1 (Week 2): STG=LTG    Therapy Documentation Precautions:  Precautions Precautions: Fall Precaution Comments: right sided weakness and dyscoordination Required Braces or Orthoses: Knee Immobilizer - Right Knee Immobilizer - Right:  (when walking) Restrictions Weight  Bearing Restrictions: No Vital Signs: Therapy Vitals Temp: 97.9 F (36.6 C) Temp Source: Oral Pulse Rate: 63 Resp: 18 BP: (!) 102/47 mmHg Patient Position (if appropriate): Lying Oxygen Therapy SpO2: 99 % O2 Device: Not Delivered ADL: ADL ADL Comments: see functional navigator  See Function Navigator for Current Functional Status.    Phineas Semen 09/29/2015, 7:21 AM

## 2015-09-29 NOTE — Progress Notes (Signed)
[place] [facility]IT[facility]     PROGRESS NOTE  Subjective/Complaints:  Patient sitting up in her chair today. She had a good weekend and is looking forward to going home on Wednesday.  ROS:  Denies CP, SOB, n/v/d.  Objective: Vital Signs: Blood pressure 102/47, pulse 63, temperature 97.9 F (36.6 C), temperature source Oral, resp. rate 18, weight 66.724 kg (147 lb 1.6 oz), SpO2 99 %. No results found.  Recent Labs  09/29/15 0820  WBC 12.1*  HGB 12.3  HCT 37.3  PLT 341    Recent Labs  09/29/15 0820  NA 132*  K 4.5  CL 103  GLUCOSE 168*  BUN 17  CREATININE 0.79  CALCIUM 11.7*   CBG (last 3)  No results for input(s): GLUCAP in the last 72 hours.  Wt Readings from Last 3 Encounters:  09/24/15 66.724 kg (147 lb 1.6 oz)  09/13/15 67.132 kg (148 lb)  08/01/15 70.489 kg (155 lb 6.4 oz)    Physical Exam:  BP 102/47 mmHg  Pulse 63  Temp(Src) 97.9 F (36.6 C) (Oral)  Resp 18  Wt 66.724 kg (147 lb 1.6 oz)  SpO2 99%  Constitutional: She appears well-developed. NAD. Vital signs reviewed.  HENT: Normocephalic and atraumatic.  Mouth: Oral ulcers, Almost healed Eyes: Conjunctivae and EOM are normal.  Cardiovascular: Normal rate and regular rhythm.No murmur heard. Respiratory: Effort normal and breath sounds normal. No stridor. No respiratory distress. She has no wheezes.  GI: Soft. Bowel sounds are normal. She exhibits no distension. There is no tenderness.  Musculoskeletal: She exhibits no edema or tenderness.  Neurological: She is alert and oriented. Speech clear.  Follows commands without difficulty.  Motor LUE/LLE 5/5.   RUE: 5/5 proximal to distal RLE: HF 4+/5, KE 4+/5, PF/DF 4+/5.  Skin: Skin is warm and dry. Area of induration along the dorsal forearm.  Psychiatric: She has a normal mood and affect. Her behavior is normal. Judgment and thought content normal  Assessment/Plan: 1. Functional deficits secondary to left brain metastatic  mass from colon CA which require 3+ hours per day of interdisciplinary therapy in a comprehensive inpatient rehab setting. Physiatrist is providing close team supervision and 24 hour management of active medical problems listed below. Physiatrist and rehab team continue to assess barriers to discharge/monitor patient progress toward functional and medical goals.  Function:  Bathing Bathing position   Position: Wheelchair/chair at sink  Bathing parts Body parts bathed by patient: Right arm, Abdomen, Front perineal area, Left arm, Chest, Buttocks, Right upper leg, Right lower leg, Left lower leg, Left upper leg Body parts bathed by helper: Right lower leg, Left lower leg  Bathing assist Assist Level: Supervision or verbal cues   Set up : To obtain items  Upper Body Dressing/Undressing Upper body dressing   What is the patient wearing?: Bra, Pull over shirt/dress Bra - Perfomed by patient: Thread/unthread right bra strap, Thread/unthread left bra strap, Hook/unhook bra (pull down sports bra)   Pull over shirt/dress - Perfomed by patient: Thread/unthread right sleeve, Thread/unthread left sleeve, Put head through opening, Pull shirt over trunk          Upper body assist Assist Level: Supervision or verbal cues   Set up : To obtain clothing/put away  Lower Body Dressing/Undressing Lower body dressing   What is the patient wearing?: Underwear, Pants, [employer], Shoes Underwear - Performed by patient: Thread/unthread left underwear leg, Pull underwear up/down, Thread/unthread right underwear leg Underwear - Performed by helper: Thread/unthread  right underwear leg Pants- Performed by patient: Thread/unthread left pants leg, Pull pants up/down, Thread/unthread right pants leg   Non-skid slipper socks- Performed by patient: Don/doff left sock Non-skid slipper socks- Performed by helper: Don/doff right sock     Shoes - Performed by patient: Don/doff left shoe, Fasten right, Fasten left,  Don/doff right shoe Shoes - Performed by helper: Don/doff right shoe     TED Hose - Performed by patient: Don/doff right TED hose, Don/doff left TED hose TED Hose - Performed by helper: Don/doff right TED hose, Don/doff left TED hose  Lower body assist Assist for lower body dressing: Supervision or verbal cues      Toileting Toileting   Toileting steps completed by patient: Adjust clothing prior to toileting, Performs perineal hygiene, Adjust clothing after toileting Toileting steps completed by helper: Adjust clothing prior to toileting Toileting Assistive Devices: Grab bar or rail  Toileting assist Assist level: Supervision or verbal cues   Transfers Chair/bed transfer   Chair/bed transfer method: Stand pivot Chair/bed transfer assist level: Supervision or verbal cues Chair/bed transfer assistive device: Armrests     Locomotion Ambulation     Max distance: >300 Assist level: Touching or steadying assistance (Pt > 75%)   Wheelchair   Type: Manual Max wheelchair distance: 150 Assist Level: Supervision or verbal cues  Cognition Comprehension Comprehension assist level: Understands complex 90% of the time/cues 10% of the time  Expression Expression assist level: Expresses complex 90% of the time/cues < 10% of the time  Social Interaction Social Interaction assist level: Interacts appropriately 90% of the time - Needs monitoring or encouragement for participation or interaction.  Problem Solving Problem solving assist level: Solves complex problems: Recognizes & self-corrects  Memory Memory assist level: Complete Independence: No helper     Medical Problem List and Plan: 1. 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 Follow-up per radiation oncology Dr. Patric Dykes and oncology services Dr. Burr Medico admitted on 3/15.  Continue decadron 9 mg bid chronically until seen by Oncologist S/p stereotactic treatment  on 09/18/15  Per Rad/Onc, pt to follow up in ~4/16 2.PE: Xarelto 3. Pain Management:   Pain well controlled   DCed Neurontin and baclofen on 3/22. 4. Constipation. Laxative assistance 5. Neuropsych: This patient is capable of making decisions on her own behalf. 6. Skin/Wound Care: Routine skin checks 7. Fluids/Electrolytes/Nutrition: Routine I&O  Hyponatremia: 132 on 3/27 (stable) 8. Hypercalcemia:   IV pamidronate on 3/22  Ca++ 11.7 on 3/27 9. Anemia: Resolved  Cont to monitor  Hb 12.3 on 3/27 10. Tachycardia: Continue to monitor with increased activity. Consider medications if necessary 11.  HTN: Resolved  Overall well controlled without medications  Will cont to monitor 12. Recurrent aphthous stomatitis  Fluocinonide QID until ulcer healed (almost healed)   Patient not taking medication as scheduled, encouraged patient to take as prescribed 13. Cellulitis:   WBCs Trending down   No signs or symptoms of infection, afebrile  Likely cellulitis right dorsal forearm  Started Bactrim on 3/21  UA with few bacteria, Ucx with insignificant growth on 3/22  CXR reviewed from 3/23, unremarkable pulmonary disease   Will continue to monitor   Will lance today   Days) 12 A FACE TO FACE EVALUATION WAS PERFORMED  Alexandrina Fiorini Lorie Phenix 09/29/2015 10:20 AM

## 2015-09-29 NOTE — Plan of Care (Signed)
Problem: RH Balance Goal: LTG Patient will maintain dynamic standing with ADLs (OT) LTG: Patient will maintain dynamic standing balance with assist during activities of daily living (OT)  Upgraded secondary to pt's progress  Problem: RH Tub/Shower Transfers Goal: LTG Patient will perform tub/shower transfers w/assist (OT) LTG: Patient will perform tub/shower transfers with assist, with/without cues using equipment (OT)  Upgraded secondary to pt's progress  Problem: RH Attention Goal: LTG Patient will demonstrate focused/sustained (OT) LTG: Patient will demonstrate focused/sustained/selective/alternating/divided attention during functional activities in specific environment with assist for # of minutes (OT)  Upgraded secondary to pt's progress

## 2015-09-29 NOTE — Progress Notes (Signed)
Physical Therapy Session Note  Patient Details  Name: [name] [name] MRN: [id number] Date of Birth: Jul 29, 1950  Today's Date: 09/29/2015 PT Individual Time: 0907-1000 PT Individual Time Calculation (min): 53 min   Short Term Goals: Week 2:  PT Short Term Goal 1 (Week 2): =LTGs  Skilled Therapeutic Interventions/Progress Updates:    Pt received resting in w/c, no c/o pain, and agreeable to therapy session.  Session focus on gait without AD and dynamic standing balance.  Pt amb to therapy gym with supervision.  PT administered Berg balance scale; Patient demonstrates lower fall risk as noted by score of  55/56 on Berg Balance Scale.  PT discussed results with patient and provided education/recomendations for use of RW in community and whenever fatigued at home.  PT and pt discussed scenarios for determining when to use AD vs no AD and pt demonstrates understanding.  PT instructed pt in side stepping to R and L, backwards walking, tandem stance on R and L, and 10x sit<>stand with no UE support for LE strengthening and high level gait.  Dynamic standing balance activity with Wii Fit Board (soccer balls, hula hooping, stepping, and yoga) with close supervision for safety.  Pt amb back to room at end of session and left resting in w/c with call bell in reach and needs met.    Therapy Documentation Precautions:  Precautions Precautions: Fall Precaution Comments: right sided weakness and dyscoordination Required Braces or Orthoses: Knee Immobilizer - Right Knee Immobilizer - Right:  (when walking) Restrictions Weight Bearing Restrictions: No  Balance: Standardized Balance Assessment Standardized Balance Assessment: Berg Balance Test Berg Balance Test Sit to Stand: Able to stand without using hands and stabilize independently Standing Unsupported: Able to stand safely 2 minutes Sitting with Back Unsupported but Feet Supported on Floor or Stool: Able to sit safely and securely 2 minutes Stand  to Sit: Sits safely with minimal use of hands Transfers: Able to transfer safely, minor use of hands Standing Unsupported with Eyes Closed: Able to stand 10 seconds safely Standing Ubsupported with Feet Together: Able to place feet together independently and stand 1 minute safely From Standing, Reach Forward with Outstretched Arm: Can reach confidently >25 cm (10") From Standing Position, Pick up Object from Floor: Able to pick up shoe safely and easily From Standing Position, Turn to Look Behind Over each Shoulder: Looks behind from both sides and weight shifts well Turn 360 Degrees: Able to turn 360 degrees safely in 4 seconds or less Standing Unsupported, Alternately Place Feet on Step/Stool: Able to stand independently and safely and complete 8 steps in 20 seconds Standing Unsupported, One Foot in Front: Able to place foot tandem independently and hold 30 seconds Standing on One Leg: Able to lift leg independently and hold 5-10 seconds Total Score: 55   See Function Navigator for Current Functional Status.   Therapy/Group: Individual Therapy  Manaal Mandala E Penven-Crew 09/29/2015, 11:56 AM

## 2015-09-29 NOTE — Consult Note (Signed)
ORTHOPAEDIC CONSULTATION HISTORY & PHYSICAL REQUESTING PHYSICIAN: Ankit Lorie Phenix, MD  Chief Complaint: right forearm suspected abscess  HPI: [name] [name] is a 65 y.o. female with metastatic colon cancer who has developed a painful enlargement on the dorsal proximal surface of the right forearm.  It was thought to represent initially cellulitis with a small central area that has grown into what appears to be a subcutaneous abscess.  General surgery was consulted, who deferred evaluation and treatment to orthopedics, who deferred evaluation and treatment to hand surgery.  Past Medical History  Diagnosis Date  . Colon cancer ([place])     dx'd 2014  . Heart murmur     ? as a child   . Pulmonary embolism ([place]) 11/2014   . Anemia     hx of    Past Surgical History  Procedure Laterality Date  . Abdominal hysterectomy  2005  . Cesarean section  1976  . Flexible sigmoidoscopy N/A 05/25/2013    Procedure: FLEXIBLE SIGMOIDOSCOPY;  Surgeon: [name], MD;  Location: [facility];  Service: Endoscopy;  Laterality: N/A;  needs floro  . Colonic stent placement N/A 05/25/2013    Procedure: COLONIC STENT PLACEMENT;  Surgeon: [name], MD;  Location: [facility];  Service: Endoscopy;  Laterality: N/A;  . Portacath placement Right 06/25/2013    Procedure: ULTRA SOUND GUIDED INSERTION PORT-A-CATH;  Surgeon: [name], MD;  Location: [facility];  Service: General;  Laterality: Right;  . Flexible sigmoidoscopy N/A 08/19/2014    Procedure: FLEXIBLE SIGMOIDOSCOPY;  Surgeon: Gatha Mayer, MD;  Location: [facility];  Service: Endoscopy;  Laterality: N/A;  . Colonic stent placement N/A 08/19/2014    Procedure: COLONIC STENT PLACEMENT;  Surgeon: Gatha Mayer, MD;  Location: [facility];  Service: Endoscopy;  Laterality: N/A;  . Flexible sigmoidoscopy N/A 08/19/2014    Procedure: FLEXIBLE SIGMOIDOSCOPY;  Surgeon: Gatha Mayer, MD;  Location: [facility];  Service:  Endoscopy;  Laterality: N/A;  . Colonic stent placement N/A 08/19/2014    Procedure: COLONIC STENT PLACEMENT;  Surgeon: Gatha Mayer, MD;  Location: [facility];  Service: Endoscopy;  Laterality: N/A;  . Colon resection N/A 10/28/2014    Procedure: Transverse loop colostomy;  Surgeon: [name], MD;  Location: [facility];  Service: General;  Laterality: N/A;  . Colostomy revision N/A 01/03/2015    Procedure: REVISION OF TRANSVERSE LOOP COLOSTOMY WITH PARTIAL  COLECTOMY;  Surgeon: [name], MD;  Location: [facility];  Service: General;  Laterality: N/A;  . Laparoscopic low anterior resection  03/06/2015    03/06/2015  . Laparoscopic splenectomy  03/06/2015  . Colon resection N/A 03/06/2015    Procedure: LAPAROSCOPIC takedown loop colostomy ;  Surgeon: Michael Boston, MD;  Location: [facility];  Service: General;  Laterality: N/A;  . 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 Bowel resection  03/06/2015    Procedure: LOW ANTERIOR BOWEL RESECTION, rigid proctoscopy;  Surgeon: Michael Boston, MD;  Location: [facility];  Service: General;;  . Polypectomy  03/06/2015    Procedure: transverse POLYPECTOMY x 4;  Surgeon: Michael Boston, MD;  Location: [facility];  Service: General;;   Social History   Social History  . Marital Status: Married    Spouse Name: N/A  . Number of Children: 3  . Years of Education: N/A   Occupational History  .  Social History Main Topics  . Smoking status: Former Smoker -- 1 years    Types: Cigarettes    Quit date: 05/24/1971  . Smokeless tobacco: Never Used  . Alcohol Use: No  . Drug Use: No  . Sexual Activity: Not on file   Other Topics Concern  . Not on file   Social History Narrative   Family History  Problem Relation Age of Onset  . Colon cancer Sister 84  . Diabetes Neg Hx   . Thyroid disease Neg Hx   . Breast cancer Maternal Aunt   . Prostate cancer Maternal  Uncle   . Bone cancer Maternal Grandfather   . Prostate cancer Maternal Uncle    Allergies  Allergen Reactions  . Oxycontin [Oxycodone Hcl] Anaphylaxis, Hives and Itching  . Codeine Itching and Nausea And Vomiting   Prior to Admission medications   Medication Sig Start Date End Date Taking? Authorizing Provider  acetaminophen (TYLENOL) 500 MG tablet Take 500 mg by mouth every 6 (six) hours as needed for moderate pain or headache.    Yes Historical Provider, MD  baclofen (LIORESAL) 10 MG tablet Take 0.5 tablets (5 mg total) by mouth 3 (three) times daily. 09/17/15  Yes Nita Sells, MD  Cyanocobalamin (VITAMIN B 12 PO) Take 1 tablet by mouth every morning.   Yes Historical Provider, MD  dexamethasone (DECADRON) 6 MG tablet Take 1.5 tablets (9 mg total) by mouth 2 (two) times daily with a meal. 09/17/15  Yes Nita Sells, MD  dextromethorphan (DELSYM) 30 MG/5ML liquid Take 10 mLs by mouth every 12 (twelve) hours as needed for cough.   Yes Historical Provider, MD  diphenhydrAMINE (BENADRYL) 25 MG tablet Take 25 mg by mouth every 6 (six) hours as needed (cough).   Yes Historical Provider, MD  gabapentin (NEURONTIN) 300 MG capsule Take 300 mg by mouth 3 (three) times daily. 09/03/15  Yes Historical Provider, MD  IRON PO Take 1 tablet by mouth every morning.   Yes Historical Provider, MD  polyethylene glycol powder (GLYCOLAX/MIRALAX) powder TAKE 34 G (2 DOSES) BY MOUTH DAILY AS NEEDED FOR MILD CONSTIPATION OR MODERATE CONSTIPATION 03/03/14  Yes Concha Norway, MD  rivaroxaban (XARELTO) 20 MG TABS tablet Take 1 tablet (20 mg total) by mouth daily with supper. 06/20/15  Yes Truitt Merle, MD   No results found.  Positive ROS: All other systems have been reviewed and were otherwise negative with the exception of those mentioned in the HPI and as above.  Physical Exam: Vitals: Refer to EMR. Constitutional:  WD, WN, NAD HEENT:  NCAT, EOMI Neuro/Psych:  Alert & oriented to person, place, and time;  appropriate mood & affect Lymphatic: No generalized extremity edema or lymphadenopathy Extremities / MSK:  The extremities are normal with respect to appearance, ranges of motion, joint stability, muscle strength/tone, sensation, & perfusion except as otherwise noted:  Right proximal dorsal forearm with 2-3 cm enlargement which is tender, central opening from which can't purulence can be expressed with milking.  Minimal reactive edematous subcutaneous tissues outside of the 2-3 cm border  Assessment: Right dorsal forearm subcutaneous abscess  Plan: I discussed the need for incision and drainage with the patient, who consented.  Lidocaine with epinephrine was infiltrated about the region of the abscess in a U-shaped.  The lesion was prepped with Betadine, and a 15 blade used to divide the skin longitudinally to the center of the process.  Flocculent purulent material was released, and cultures were obtained.  Spreading dissection was  carried throughout the subcutaneous space occupied by the enlargement and it was in this way evacuated and decompressed.  It was copiously irrigated and then a iodoform packing was placed.  A dressing was placed over it.  The packing should be pulled Wednesday prior to discharge.  She will need at least 7-10 days of oral antibiotics as an outpatient.  The wound can be allowed to close secondarily.    Rayvon Char [address], [address] [place], [place]  [address] Office: [phone] Mobile: [phone]  09/29/2015, 6:20 PM

## 2015-09-29 NOTE — Progress Notes (Signed)
Speech Language Pathology Daily Session Note  Patient Details  Name: [name] [name] MRN: [id number] Date of Birth: Feb 29, 1951  Today's Date: 09/29/2015 SLP Individual Time: 1300-1345 SLP Individual Time Calculation (min): 45 min  Short Term Goals: Week 2: SLP Short Term Goal 1 (Week 2): Pt demonstrate completion of moderate level reasoning tasks with min A SLP Short Term Goal 2 (Week 2): Pt complete functional math tasks with min A SLP Short Term Goal 3 (Week 2): Pt complete medication management tasks and other similar organization tasks with min A SLP Short Term Goal 4 (Week 2): Pt verbalize cognitive limitations/precautions with min A of questioning cues.  Skilled Therapeutic Interventions: Skilled treatment session focused on cognitive goals. SLP facilitated session by providing Min A question and verbal cues for anticapatory awareness in regards to d/c planning and having 24 hour supervision at home to maximize overall safety. SLP also facilitated the session by re-administering the MoCA. Patient scored 23/30 points with a score of 26 or above considered normal. Patient continues to demonstrate impairments in the areas of organization and recall. Patient left upright in wheelchair with all needs within reach. Continue with current plan of care.    Function:   Cognition Comprehension Comprehension assist level: Understands complex 90% of the time/cues 10% of the time  Expression   Expression assist level: Expresses complex 90% of the time/cues < 10% of the time  Social Interaction Social Interaction assist level: Interacts appropriately 90% of the time - Needs monitoring or encouragement for participation or interaction.  Problem Solving Problem solving assist level: Solves complex problems: Recognizes & self-corrects  Memory Memory assist level: Recognizes or recalls 90% of the time/requires cueing < 10% of the time    Pain No/Denies Pain   Therapy/Group: Individual  Therapy  Roselinda Bahena 09/29/2015, 3:43 PM

## 2015-09-30 ENCOUNTER — Inpatient Hospital Stay (HOSPITAL_COMMUNITY): Payer: Medicaid Other | Admitting: Physical Therapy

## 2015-09-30 ENCOUNTER — Inpatient Hospital Stay (HOSPITAL_COMMUNITY): Payer: Medicaid Other | Admitting: Occupational Therapy

## 2015-09-30 ENCOUNTER — Inpatient Hospital Stay (HOSPITAL_COMMUNITY): Payer: Medicaid Other | Admitting: Speech Pathology

## 2015-09-30 DIAGNOSIS — B370 Candidal stomatitis: Secondary | ICD-10-CM | POA: Insufficient documentation

## 2015-09-30 DIAGNOSIS — L02413 Cutaneous abscess of right upper limb: Secondary | ICD-10-CM | POA: Insufficient documentation

## 2015-09-30 MED ORDER — FLUCONAZOLE 100 MG PO TABS
100.0000 mg | ORAL_TABLET | Freq: Every day | ORAL | Status: DC
  Administered 2015-09-30 – 2015-10-01 (×2): 100 mg via ORAL
  Filled 2015-09-30 (×2): qty 1

## 2015-09-30 NOTE — Discharge Summary (Signed)
[name] [name], [name] [name] NO.:  [id number]  MEDICAL RECORD NO.:  [id number]  LOCATION:  [id number]                        FACILITY:  [facility]  PHYSICIAN:  Delice Lesch, MD        DATE OF BIRTH:  Jan 31, 1951  DATE OF ADMISSION:  09/17/2015 DATE OF DISCHARGE:  10/01/2015                              DISCHARGE SUMMARY   DISCHARGE DIAGNOSES: 1. 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 2. Xarelto for deep venous thrombosis prophylaxis, pulmonary emboli. 3. Pain management. 4. Right forearm cellulitis/abscess. 5. Hypertension. 6. Hyponatremia. 7. Hypercalcemia. 8. Oral thrush.   HISTORY OF PRESENT ILLNESS:  This is a 65 year old right-handed female, history of stage IV colon cancer with metastasis to the liver and peritoneum who declined chemotherapy due to concerns for SE, anemia, pulmonary emboli on Xarelto.  Admitted on September 13, 2015, with complaints of right upper extremity ataxia, right lower extremity weakness, difficulty in ambulation, and falls.  MRI showed a 1.7 cm left frontoparietal mass with moderate vasogenic edema consistent with solitary metastasis.  MRI of lumbar spine L3-L4 with mild-to-moderate canal and right foraminal stenosis.  MRI of thoracic cervical spine, negative for metastatic disease.  Placed on steroid protocol. Hypercalcemia noted, started on intravenous Lasix, fluids and calcitonin for treatment.  Dr. Lisbeth Renshaw consulted.  The patient to undergo CT simulation and follow up with Dr. Burr Medico of Hematology/Oncology Services as patient has elected palliative radiation only.  Physical and occupational therapy ongoing.  The patient was admitted for comprehensive rehab program.  PAST MEDICAL HISTORY:  See discharge diagnoses.  SOCIAL HISTORY:  Lives with spouse, independent with single-point cane prior to admission.  Functional status upon admission to [facility] was moderate assist, ambulate 25 feet  rolling walker; minimal assist, stand pivot transfers; min-to-mod assist, activities of daily living.  PHYSICAL EXAMINATION:  VITAL SIGNS:  Blood pressure 118/54, pulse 81, temperature 98, respirations 16. GENERAL:  This was an alert female, oriented to person, place, and time. LUNGS:  Clear to auscultation.  No wheeze. CARDIAC:  Regular rate and rhythm without murmur. ABDOMEN:  Soft, nontender.  Good bowel sounds.  REHABILITATION HOSPITAL COURSE:  The patient was admitted for a comprehensive rehab program.  The following issues were addressed during patient's rehabilitation stay.  Pertaining to Ms. Pua's left brain metastatic mass or colon cancer, she would follow up Radiation/Oncology with Dr. Patric Dykes, Oncology Services and Dr. Burr Medico.  Per Radiation/Oncology, patient to follow up on October 19, 2015.  She will continue on Xarelto for pulmonary emboli.  Blood pressures remained well controlled without medications.  Bouts of tachycardia improved with overall strength and mobility.  No chest pain or shortness of breath. She would remain on current dose of Decadron as advised at time of discharge to follow up with Oncology Services.  Orthopedic Services, Dr. Irine Seal, consulted on September 29, 2015, for cellulitis, question suspect, right dorsal forearm.  Patient underwent irrigation and debridement on September 29, 2015, wound with packing applied to be removed on day of discharge.  The wound will be allowed to close secondarily, placed on Bactrim for 7-10 day course.  The patient remained afebrile. The patient  received weekly collaborative interdisciplinary team conferences to discuss estimated length of stay, family teaching, and any barriers to her discharge.  She could ambulate to the therapy gym with supervision.  She demonstrates a low fall risk, scoring 55/56 on Berg balance scale.  Patient instructed on side stepping right to left, walking backwards, navigating stairs.   She could gather her belongings for activities of daily living and homemaking, perform stand pivot transfers to wheelchair from bed with supervision.  Full family teaching was completed and plan discharge to home.  DISCHARGE MEDICATIONS: 1. Decadron 10 mg twice daily. 2. Protonix 40 mg p.o. daily. 3. MiraLAX daily, hold for loose stools. 4. Xarelto 20 mg p.o. daily. 5. Bactrim DS 2 tablets every 12 hours x8 days. 6. Vitamin B12 100 mcg p.o. daily.  DIET:  Regular.  FOLLOWUP:  She would follow up with Dr. Posey Pronto with office to call for appointment; Dr. Kyung Rudd; as well as Dr. Burr Medico; and Dr. Patric Dykes, Oncology Services as advised; Dr. Milly Jakob, Orthopedic Service 2 weeks call for appointment; Medical Management, Betti D. Ayesha Rumpf, MD.     Lauraine Rinne, P.A.   ______________________________ Delice Lesch, MD    DA/MEDQ  D:  09/30/2015  T:  09/30/2015  Job:  [id number]  cc:   Dr. Carolynn Serve, M.D., Ph.D. Kary Kos, M.D. Dr. Milly Jakob Betti D. Ayesha Rumpf, M.D.

## 2015-09-30 NOTE — Progress Notes (Signed)
Speech Language Pathology Daily Session Note  Patient Details  Name: [name] [name] MRN: [id number] Date of Birth: 1951-04-17  Today's Date: 09/30/2015 SLP Individual Time: 0930-1000 SLP Individual Time Calculation (min): 30 min  Short Term Goals: Week 2: SLP Short Term Goal 1 (Week 2): Pt demonstrate completion of moderate level reasoning tasks with min A SLP Short Term Goal 2 (Week 2): Pt complete functional math tasks with min A SLP Short Term Goal 3 (Week 2): Pt complete medication management tasks and other similar organization tasks with min A SLP Short Term Goal 4 (Week 2): Pt verbalize cognitive limitations/precautions with min A of questioning cues.  Skilled Therapeutic Interventions: Skilled treatment session focused on patient and family education with the patient's daughter in regards to her current cognitive function. Both were educated on the need for 24 hour supervision to maximize overall safety and strategies to utilize at home to maximize recall, problem solving and overall safety, especially in regards to medication and money management. Both verbalized understanding and handouts were given to reinforce information. Patient left upright in wheelchair with family present. Continue with current plan of care.    Function:  Cognition Comprehension Comprehension assist level: Follows complex conversation/direction with no assist  Expression   Expression assist level: Expresses complex ideas: With no assist  Social Interaction Social Interaction assist level: Interacts appropriately with others - No medications needed.  Problem Solving Problem solving assist level: Solves complex 90% of the time/cues < 10% of the time  Memory Memory assist level: Recognizes or recalls 90% of the time/requires cueing < 10% of the time    Pain Pain Assessment Pain Assessment: No/denies pain  Therapy/Group: Individual Therapy  Zea Kostka 09/30/2015, 2:08 PM

## 2015-09-30 NOTE — Progress Notes (Signed)
Physical Therapy Discharge Summary  Patient Details  Name: [name] [name] MRN: [id number] Date of Birth: 01-31-1951  Today's Date: 09/30/2015 PT Individual Time: 8882-8003 PT Individual Time Calculation (min): 42 min    Patient has met 11 of 11 long term goals due to improved activity tolerance, improved balance, improved postural control, increased strength, functional use of  right lower extremity, improved awareness and improved coordination.  Patient to discharge at an ambulatory level Supervision.   Patient's care partner is independent to provide the necessary supervision assistance at discharge.  Recommendation:  Patient will benefit from ongoing skilled PT services in home health setting to continue to advance safe functional mobility, address ongoing impairments in strength, endurance, and coordination, and minimize fall risk.  Equipment: RW  Reasons for discharge: treatment goals met  Patient/family agrees with progress made and goals achieved: Yes   Skilled Therapeutic Intervention:  Pt received resting in w/c and agreeable to therapy session.  Reports soreness in RUE from recent procedure but does not rate.  Gait training x300' with supervision and no AD.  Car transfer with close supervision and verbal cues for sitting down before swinging LEs into car.  Pt amb to therapy apartment and performed bed mobility and furniture transfer from low couch with supervision.  Stair negotiation x12 steps with 2 rails and close supervision.  Pt performed Nustep x10 minutes at level 4 for overall endurance and LE strengthening.  Pt reviewed HEP with PT and described each exercise with min verbal cues to attend to therapist's notes on page.  Pt returned to room at end of session and positioned with call bell in reach and needs met.   PT Discharge Precautions/Restrictions Precautions Precautions: Fall Precaution Comments: RLE decreased coordination with fatigue  Restrictions Weight Bearing  Restrictions: No Pain Pain Assessment Pain Assessment: No/denies pain (c/o soreness in R lower arm from I&D yesterday)  Cognition Overall Cognitive Status: Within Functional Limits for tasks assessed Arousal/Alertness: Awake/alert Orientation Level: Oriented X4 Attention: Selective Sustained Attention: Appears intact Selective Attention: Appears intact Alternating Attention: Impaired Alternating Attention Impairment: Verbal complex Motor  Motor Motor: Abnormal postural alignment and control Motor - Discharge Observations: motor control, coordination, power, and endurance greatly improved on R  Mobility Bed Mobility Bed Mobility: Supine to Sit;Sit to Supine Supine to Sit: 6: Modified independent (Device/Increase time) Sit to Supine: 6: Modified independent (Device/Increase time) Transfers Transfers: Yes Sit to Stand: 5: Supervision Stand to Sit: 5: Supervision Locomotion  Ambulation Ambulation: Yes Ambulation/Gait Assistance: 5: Supervision Ambulation Distance (Feet): 300 Feet Assistive device: None Gait Gait: Yes Gait Pattern: Impaired Gait Pattern: Right hip hike;Step-through pattern;Decreased weight shift to right Pelvis - Stance Phase - Impaired Gait Pattern: Decreased weight shift - Right Pelvis - Swing Phase- Impaired Gait Pattern: Ipsilateral drop Gait velocity: slow for age norms Stairs / Additional Locomotion Stairs: Yes Stairs Assistance: 5: Supervision Stair Management Technique: Two rails Number of Stairs: 12  Trunk/Postural Assessment  Cervical Assessment Cervical Assessment: Within Functional Limits Thoracic Assessment Thoracic Assessment: Within Functional Limits Lumbar Assessment Lumbar Assessment: Within Functional Limits Postural Control Postural Control: Deficits on evaluation Protective Responses: delayed, but improving compared to time of evaluation  Balance Balance Balance Assessed: Yes Static Standing Balance Static Standing - Balance  Support: No upper extremity supported Static Standing - Level of Assistance: 5: Stand by assistance Dynamic Standing Balance Dynamic Standing - Balance Support: During functional activity Dynamic Standing - Level of Assistance: 5: Stand by assistance Extremity Assessment  RLE Assessment RLE Assessment: Exceptions to [facility] RLE AROM (degrees) RLE Overall AROM Comments: WFL in sitting RLE Strength RLE Overall Strength Comments: hip flexion 4/5, knee extension 5/5, knee flexion 4/5, PF 4+/5, DF 3+/5  LLE Assessment LLE Assessment: Within Functional Limits LLE AROM (degrees) LLE Overall AROM Comments: WFL in sitting LLE Strength LLE Overall Strength Comments: 5/5 throughout   See Function Navigator for Current Functional Status.  [name] [name] 09/30/2015, 12:14 PM

## 2015-09-30 NOTE — Progress Notes (Signed)
Speech Language Pathology Discharge Summary  Patient Details  Name: [name] [name] MRN: [id number] Date of Birth: [birth year]  Patient has met 4 of 4 long term goals.  Patient to discharge at overall Supervision level.   Reasons goals not met: N/A   Clinical Impression/Discharge Summary: Patient has made functional gains and has met 4 of 4 LTG's this admission due to improved cognitive function. Currently, patient requires overall supervision verbal cues to complete functional and mildly complex tasks safely in regards to attention, problem solving, recall, awareness and overall safety. Patient and family education is complete and patient will discharge home with 24 hour supervision from family. Patient would benefit from f/u SLP services to maximize cognitive function and overall functional independence in order to reduce caregiver burden.   Care Partner:  Caregiver Able to Provide Assistance: Yes  Type of Caregiver Assistance: Physical;Cognitive  Recommendation:  Home Health SLP;24 hour supervision/assistance  Rationale for SLP Follow Up: Maximize cognitive function and independence;Reduce caregiver burden   Equipment: N/A   Reasons for discharge: Treatment goals met   Patient/Family Agrees with Progress Made and Goals Achieved: Yes   Function:   Cognition Comprehension Comprehension assist level: Follows complex conversation/direction with no assist  Expression   Expression assist level: Expresses complex ideas: With no assist  Social Interaction Social Interaction assist level: Interacts appropriately with others - No medications needed.  Problem Solving Problem solving assist level: Solves complex 90% of the time/cues < 10% of the time  Memory Memory assist level: Recognizes or recalls 90% of the time/requires cueing < 10% of the time   Delmas Faucett 09/30/2015, 2:13 PM

## 2015-09-30 NOTE — Discharge Summary (Signed)
Discharge summary job # [id number]

## 2015-09-30 NOTE — Progress Notes (Addendum)
[facility] PHYSICAL MEDICINE & REHABILITATION     PROGRESS NOTE  Subjective/Complaints:  Patient seen sitting in her chair this morning she notes her ulcer has resolved. She also notes some discomfort of the limb site of her forearm. She also complains of oral thrush  ROS:  + Right dorsal forearm pain, oral thrush. Denies CP, SOB, n/v/d.  Objective: Vital Signs: Blood pressure 170/50, pulse 65, temperature 98 F (36.7 C), temperature source Oral, resp. rate 20, weight 66.724 kg (147 lb 1.6 oz), SpO2 100 %. No results found.  Recent Labs  09/29/15 0820  WBC 12.1*  HGB 12.3  HCT 37.3  PLT 341    Recent Labs  09/29/15 0820  NA 132*  K 4.5  CL 103  GLUCOSE 168*  BUN 17  CREATININE 0.79  CALCIUM 11.7*   CBG (last 3)  No results for input(s): GLUCAP in the last 72 hours.  Wt Readings from Last 3 Encounters:  09/24/15 66.724 kg (147 lb 1.6 oz)  09/13/15 67.132 kg (148 lb)  08/01/15 70.489 kg (155 lb 6.4 oz)    Physical Exam:  BP 170/50 mmHg  Pulse 65  Temp(Src) 98 F (36.7 C) (Oral)  Resp 20  Wt 66.724 kg (147 lb 1.6 oz)  SpO2 100%  Constitutional: She appears well-developed. NAD. Vital signs reviewed.  HENT: Normocephalic and atraumatic.  Mouth: Oral ulcers, Healed, oral thrush Eyes: Conjunctivae and EOM are normal.  Cardiovascular: Normal rate and regular rhythm.No murmur heard. Respiratory: Effort normal and breath sounds normal. No stridor. No respiratory distress. She has no wheezes.  GI: Soft. Bowel sounds are normal. She exhibits no distension. There is no tenderness.  Musculoskeletal: She exhibits no edema or tenderness.  Neurological: She is alert and oriented. Speech clear.  Follows commands without difficulty.  Motor LUE/LLE 5/5.   RUE: 5/5 proximal to distal RLE: HF 4+/5, KE 5/5, PF/DF 5/5.  Skin: Skin is warm and dry. Right dorsal forearm with sanguinous drainage with dressing intact.  Psychiatric: She has a normal mood and affect. Her  behavior is normal. Judgment and thought content normal  Assessment/Plan: 1. 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 Physiatrist is providing close team supervision and 24 hour management of active medical problems listed below. Physiatrist and rehab team continue to assess barriers to discharge/monitor patient progress toward functional and medical goals.  Function:  Bathing Bathing position   Position: Wheelchair/chair at sink  Bathing parts Body parts bathed by patient: Right arm, Abdomen, Front perineal area, Left arm, Chest, Buttocks, Right upper leg, Right lower leg, Left lower leg, Left upper leg Body parts bathed by helper: Right lower leg, Left lower leg  Bathing assist Assist Level: Supervision or verbal cues   Set up : To obtain items  Upper Body Dressing/Undressing Upper body dressing   What is the patient wearing?: Bra, Pull over shirt/dress Bra - Perfomed by patient: Thread/unthread right bra strap, Thread/unthread left bra strap, Hook/unhook bra (pull down sports bra)   Pull over shirt/dress - Perfomed by patient: Thread/unthread right sleeve, Thread/unthread left sleeve, Put head through opening, Pull shirt over trunk          Upper body assist Assist Level: Supervision or verbal cues   Set up : To obtain clothing/put away  Lower Body Dressing/Undressing Lower body dressing   What is the patient wearing?: Underwear, Pants, [employer], Shoes Underwear -  Performed by patient: Thread/unthread left underwear leg, Pull underwear up/down, Thread/unthread right underwear leg Underwear - Performed by helper: Thread/unthread right underwear leg Pants- Performed by patient: Thread/unthread left pants leg, Pull pants up/down, Thread/unthread right pants leg   Non-skid slipper socks- Performed by patient: Don/doff left sock Non-skid slipper  socks- Performed by helper: Don/doff right sock     Shoes - Performed by patient: Don/doff left shoe, Fasten right, Fasten left, Don/doff right shoe Shoes - Performed by helper: Don/doff right shoe     TED Hose - Performed by patient: Don/doff right TED hose, Don/doff left TED hose TED Hose - Performed by helper: Don/doff right TED hose, Don/doff left TED hose  Lower body assist Assist for lower body dressing: Supervision or verbal cues      Toileting Toileting   Toileting steps completed by patient: Adjust clothing prior to toileting, Performs perineal hygiene, Adjust clothing after toileting Toileting steps completed by helper: Adjust clothing prior to toileting Toileting Assistive Devices: Grab bar or rail  Toileting assist Assist level: Supervision or verbal cues   Transfers Chair/bed transfer   Chair/bed transfer method: Ambulatory Chair/bed transfer assist level: Supervision or verbal cues Chair/bed transfer assistive device: Armrests     Locomotion Ambulation     Max distance: 160 Assist level: Supervision or verbal cues   Wheelchair   Type: Manual Max wheelchair distance: 150 Assist Level: Supervision or verbal cues  Cognition Comprehension Comprehension assist level: Follows complex conversation/direction with no assist  Expression Expression assist level: Expresses complex ideas: With no assist  Social Interaction Social Interaction assist level: Interacts appropriately with others - No medications needed.  Problem Solving Problem solving assist level: Solves complex problems: Recognizes & self-corrects  Memory Memory assist level: Complete Independence: No helper     Medical Problem List and Plan: 1. 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 Follow-up per radiation oncology Dr. Patric Dykes and oncology services Dr. Burr Medico admitted on 3/15.  Continue decadron 9 mg bid chronically until seen by  Oncologist S/p stereotactic treatment on 09/18/15  Per Rad/Onc, pt to follow up in ~4/16  Will see patient for transitional care management in 1-2 weeks. 2.PE: Xarelto 3. Pain Management:   Pain well controlled   DCed Neurontin and baclofen on 3/22. 4. Constipation. Laxative assistance 5. Neuropsych: This patient is capable of making decisions on her own behalf. 6. Skin/Wound Care: Routine skin checks 7. Fluids/Electrolytes/Nutrition: Routine I&O  Hyponatremia: 132 on 3/27 (stable) 8. Hypercalcemia:   IV pamidronate on 3/22  Ca++ 11.7 on 3/27 9. Anemia: Resolved  Cont to monitor  Hb 12.3 on 3/27 10. Tachycardia: Continue to monitor with increased activity. Consider medications if necessary 11.  HTN:   Overall well controlled without medications  Will cont to monitor  Atypically elevated this a.m., will continue to monitor 12. Recurrent aphthous stomatitis: Resolved  Fluocinonide QID DC'd on 3/28  13. Cellulitis with superimposed subcutaneous abscess:   WBCs Trending down   No signs or symptoms of infection, afebrile  Likely cellulitis right dorsal forearm  UA with few bacteria, Ucx with insignificant growth on 3/22  CXR reviewed from 3/23, unremarkable pulmonary disease   Lanced on 3/27, appreciate surgery involvement. We'll have patient continue with 7-10 days of antibiotics. Iodoform packing to be removed tomorrow prior to discharge 14. Oral thrush  Will order Diflucan    Days) 13 A FACE TO FACE EVALUATION WAS PERFORMED  Ankit Lorie Phenix 09/30/2015 8:55 AM

## 2015-09-30 NOTE — Progress Notes (Signed)
Occupational Therapy Discharge Summary and OT Intervention  Patient Details  Name: [name] [name] MRN: [id number] Date of Birth: Jan 23, 1951  Today's Date: 09/30/2015 OT Individual Time: 1000-1055 and 1500-1557 OT Individual Time Calculation (min): 55 min and 57 min    Patient has met 12 of 12 long term goals due to improved activity tolerance, improved balance, ability to compensate for deficits, functional use of  RIGHT upper and RIGHT lower extremity, improved attention, improved awareness and improved coordination.  Patient to discharge at overall Supervision level.  Patient's care partner requires assistance to provide the necessary physical and cognitive assistance at discharge.    Reasons goals not met: all goals met  Recommendation:  Patient will benefit from ongoing skilled OT services in home health setting to continue to advance functional skills in the area of BADL and iADL.  Equipment: Tub Producer, television/film/video  Reasons for discharge: treatment goals met  Patient/family agrees with progress made and goals achieved: Yes   OT Intervention: Session 1:Upon entering the room, pt seated in wheelchair with daughter present in room for family education. Pt with no c/o pain this session. OT educated caregiver on pt's progress towards occupational therapy goals. Pt performed bathing and dressing prior to therapist arrival. Please see function tab for details regarding pt report. Caregiver provided supervision for pt ambulation to ADL apartment without use of AD. Pt performed bed making task with caregivers supervision and cues. Caregiver gave appropriate cues during ambulation for safety. OT did recommend pt not verbalize during mobility as it is distracting and decreases safety with task. Pt also ambulating up gradual incline with extra time and supervision - steady assist as pt began to fatigue. Pt resting and then returning to room with caregiver assist. Call bell and all needed items within  reach upon exiting the room.   Session 2:  Pt propelled wheelchair with B UE's onto elevator and down to gift shop for increased endurance and strength. Pt engaged in community mobility tasks by ambulating into gift shop on carpeted surface, navigating aisles, picking up objects from shelves to examine, and reaching OOB of support with close supervision for task. Pt ambulated outside with steady assist over various terrain including concrete, bricks, grass, and various slopes/inclines. Pt needing cues for safety. Pt returning to wheelchair for seated rest break before returning to room in same manner as above. Pt propelled wheelchair into bathroom for stand pivot wheelchair <>toilet with supervision. Pt performed clothing management and hygiene with supervision as well. Call bell and all needed items within reach upon exiting the room.   OT Discharge Precautions/Restrictions  Precautions Precautions: Fall Precaution Comments: RLE decreased coordination with fatigue  Restrictions Weight Bearing Restrictions: No  Pain Pain Assessment Pain Assessment: No/denies pain ADL ADL ADL Comments: see functional navigator Vision/Perception  Vision- History Baseline Vision/History: Wears glasses Wears Glasses: Reading only Patient Visual Report: No change from baseline  Cognition Overall Cognitive Status: Within Functional Limits for tasks assessed Arousal/Alertness: Awake/alert Orientation Level: Oriented X4 Attention: Selective Sustained Attention: Appears intact Selective Attention: Appears intact Alternating Attention: Impaired Alternating Attention Impairment: Verbal complex Motor  Motor Motor: Abnormal postural alignment and control Motor - Discharge Observations: R UE/LE improved in areas of coordination and control Mobility  Bed Mobility Bed Mobility: Supine to Sit;Sit to Supine Supine to Sit: 6: Modified independent (Device/Increase time) Sit to Supine: 6: Modified independent  (Device/Increase time) Transfers Transfers: Sit to Stand;Stand to Sit Sit to Stand: 5: Supervision Sit to Stand Details: Verbal cues for  technique;Verbal cues for sequencing;Verbal cues for safe use of DME/AE;Verbal cues for precautions/safety Stand to Sit: 5: Supervision  Trunk/Postural Assessment  Cervical Assessment Cervical Assessment: Within Functional Limits Thoracic Assessment Thoracic Assessment: Within Functional Limits Lumbar Assessment Lumbar Assessment: Within Functional Limits Postural Control Postural Control: Deficits on evaluation Protective Responses: delayed, but improving compared to time of evaluation  Balance Balance Balance Assessed: Yes Static Standing Balance Static Standing - Balance Support: No upper extremity supported Static Standing - Level of Assistance: 5: Stand by assistance Dynamic Standing Balance Dynamic Standing - Balance Support: During functional activity Dynamic Standing - Level of Assistance: 5: Stand by assistance Extremity/Trunk Assessment RUE Assessment RUE Assessment: Within Functional Limits LUE Assessment LUE Assessment: Within Functional Limits   See Function Navigator for Current Functional Status.  Phineas Semen 09/30/2015, 12:42 PM

## 2015-10-01 MED ORDER — SULFAMETHOXAZOLE-TRIMETHOPRIM 800-160 MG PO TABS: 2.0000 | ORAL_TABLET | Freq: Two times a day (BID) | ORAL | Status: DC

## 2015-10-01 MED ORDER — RIVAROXABAN 20 MG PO TABS: 20.0000 mg | ORAL_TABLET | Freq: Every day | ORAL | Status: DC

## 2015-10-01 MED ORDER — FLUCONAZOLE 100 MG PO TABS: 100.0000 mg | ORAL_TABLET | Freq: Every day | ORAL | Status: DC

## 2015-10-01 MED ORDER — PANTOPRAZOLE SODIUM 40 MG PO TBEC: 40.0000 mg | DELAYED_RELEASE_TABLET | Freq: Every day | ORAL | Status: DC

## 2015-10-01 MED ORDER — DEXAMETHASONE 2 MG PO TABS
10.0000 mg | ORAL_TABLET | Freq: Two times a day (BID) | ORAL | Status: DC
Start: 2015-10-01 — End: 2015-10-10

## 2015-10-01 NOTE — Progress Notes (Signed)
PAC has not been accessed this admission.  States has appt with cancer center to be flushed.  No interventions needed at this time.

## 2015-10-01 NOTE — Progress Notes (Signed)
[name] PHYSICAL MEDICINE & REHABILITATION     PROGRESS NOTE  Subjective/Complaints:  Patient seen sitting up in bed this morning. She is in good spirits. She is looking forward to family being able to go home.  ROS:  Denies CP, SOB, n/v/d.  Objective: Vital Signs: Blood pressure 111/62, pulse 70, temperature 97.7 F (36.5 C), temperature source Oral, resp. rate 18, weight 65.7 kg (144 lb 13.5 oz), SpO2 100 %. No results found.  Recent Labs  09/29/15 0820  WBC 12.1*  HGB 12.3  HCT 37.3  PLT 341    Recent Labs  09/29/15 0820  NA 132*  K 4.5  CL 103  GLUCOSE 168*  BUN 17  CREATININE 0.79  CALCIUM 11.7*   CBG (last 3)  No results for input(s): GLUCAP in the last 72 hours.  Wt Readings from Last 3 Encounters:  10/01/15 65.7 kg (144 lb 13.5 oz)  09/13/15 67.132 kg (148 lb)  08/01/15 70.489 kg (155 lb 6.4 oz)    Physical Exam:  BP 111/62 mmHg  Pulse 70  Temp(Src) 97.7 F (36.5 C) (Oral)  Resp 18  Wt 65.7 kg (144 lb 13.5 oz)  SpO2 100%  Constitutional: She appears well-developed. NAD. Vital signs reviewed.  HENT: Normocephalic and atraumatic.  Mouth: Oral ulcers, Healed, oral thrush resolved Eyes: Conjunctivae and EOM are normal.  Cardiovascular: Normal rate and regular rhythm.No murmur heard. Respiratory: Effort normal and breath sounds normal. No stridor. No respiratory distress. She has no wheezes.  GI: Soft. Bowel sounds are normal. She exhibits no distension. There is no tenderness.  Musculoskeletal: She exhibits no edema or tenderness.  Neurological: She is alert and oriented. Speech clear.  Follows commands without difficulty.  Motor LUE/LLE 5/5.   RUE: 5/5 proximal to distal RLE: HF 5/5, KE 5/5, PF/DF 5/5.  Skin: Skin is warm and dry. Right dorsal forearm with sanguinous drainage with dressing intact.  Psychiatric: She has a normal mood and affect. Her behavior is normal. Judgment and thought content normal  Assessment/Plan: 1.  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 Physiatrist is providing close team supervision and 24 hour management of active medical problems listed below. Physiatrist and rehab team continue to assess barriers to discharge/monitor patient progress toward functional and medical goals.  Function:  Bathing Bathing position   Position: Wheelchair/chair at sink (per pt report)  Bathing parts Body parts bathed by patient: Right arm, Abdomen, Front perineal area, Left arm, Chest, Buttocks, Right upper leg, Right lower leg, Left lower leg, Left upper leg Body parts bathed by helper: Right lower leg, Left lower leg  Bathing assist Assist Level: Supervision or verbal cues   Set up : To obtain items  Upper Body Dressing/Undressing Upper body dressing   What is the patient wearing?: Bra, Pull over shirt/dress (per pt report) Bra - Perfomed by patient: Thread/unthread right bra strap, Thread/unthread left bra strap, Hook/unhook bra (pull down sports bra)   Pull over shirt/dress - Perfomed by patient: Thread/unthread right sleeve, Thread/unthread left sleeve, Put head through opening, Pull shirt over trunk          Upper body assist Assist Level: More than reasonable time   Set up : To obtain clothing/put away  Lower Body Dressing/Undressing Lower body dressing   What is the patient wearing?: Underwear, Pants, Ted Hose, Shoes (per pt report) Underwear - Performed by patient: Thread/unthread  left underwear leg, Pull underwear up/down, Thread/unthread right underwear leg Underwear - Performed by helper: Thread/unthread right underwear leg Pants- Performed by patient: Thread/unthread left pants leg, Pull pants up/down, Thread/unthread right pants leg   Non-skid slipper socks- Performed by patient: Don/doff left sock Non-skid slipper socks- Performed by helper:  Don/doff right sock     Shoes - Performed by patient: Don/doff left shoe, Fasten right, Fasten left, Don/doff right shoe Shoes - Performed by helper: Don/doff right shoe     TED Hose - Performed by patient: Don/doff right TED hose, Don/doff left TED hose TED Hose - Performed by helper: Don/doff right TED hose, Don/doff left TED hose  Lower body assist Assist for lower body dressing: Supervision or verbal cues      Toileting Toileting   Toileting steps completed by patient: Adjust clothing prior to toileting, Performs perineal hygiene, Adjust clothing after toileting Toileting steps completed by helper: Adjust clothing prior to toileting Toileting Assistive Devices: Grab bar or rail  Toileting assist Assist level: Supervision or verbal cues   Transfers Chair/bed transfer   Chair/bed transfer method: Ambulatory Chair/bed transfer assist level: Supervision or verbal cues Chair/bed transfer assistive device: Armrests     Locomotion Ambulation     Max distance: 150' Assist level: Supervision or verbal cues   Wheelchair Wheelchair activity did not occur: N/A (pt ambulatory) Type: Manual Max wheelchair distance: 150 Assist Level: Supervision or verbal cues  Cognition Comprehension Comprehension assist level: Follows complex conversation/direction with no assist  Expression Expression assist level: Expresses complex ideas: With no assist  Social Interaction Social Interaction assist level: Interacts appropriately with others - No medications needed.  Problem Solving Problem solving assist level: Solves complex 90% of the time/cues < 10% of the time  Memory Memory assist level: Recognizes or recalls 90% of the time/requires cueing < 10% of the time     Medical Problem List and Plan: 1. 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 Follow-up per radiation oncology Dr. Patric Dykes and oncology services Dr. Burr Medico admitted on 3/15.   Continue decadron 9 mg bid chronically until seen by Oncologist S/p stereotactic treatment on 09/18/15  Per Rad/Onc, pt to follow up in ~4/16  Will see patient for transitional care management in 1-2 weeks. 2.PE: Xarelto 3. Pain Management:   Pain well controlled   DCed Neurontin and baclofen on 3/22. 4. Constipation. Laxative assistance 5. Neuropsych: This patient is capable of making decisions on her own behalf. 6. Skin/Wound Care: Routine skin checks 7. Fluids/Electrolytes/Nutrition: Routine I&O  Hyponatremia: 132 on 3/27 (stable) 8. Hypercalcemia:   IV pamidronate on 3/22  Ca++ 11.7 on 3/27 9. Anemia: Resolved  Cont to monitor  Hb 12.3 on 3/27 10. Tachycardia: Continue to monitor with increased activity. Consider medications if necessary 11.  HTN:   Overall well controlled without medications  Will cont to monitor 12. Recurrent aphthous stomatitis: Resolved  Fluocinonide QID DC'd on 3/28  13. Cellulitis with superimposed subcutaneous abscess of right dorsal forearm:   WBCs Trending down   No signs or symptoms of infection, afebrile  Lanced on 3/27, appreciate surgery involvement. Will have patient continue with 7-10 days of antibiotics. Iodoform packing to be removed prior to discharge 14. Oral thrush  Diflucan ordered on 3/28   Days) 14 A FACE TO FACE EVALUATION WAS PERFORMED  Ankit Lorie Phenix 10/01/2015 9:09 AM

## 2015-10-01 NOTE — Progress Notes (Signed)
Patient discharged to home with granddaughter at approximately 1320. Patient verbalized understanding of discharge instructions given and reviewed by D. [place], [place]. Patient denied pain. Dressing to right forearm changed and demonstrated to patient. Encouraged patient to have husband come in for education on dressing change. Patient reported  Husband was not coming in for education. Patient verbalized understanding dressing change orders. Dressing supplies sent home with patient. Large amount of old dried blood noted on old dressing. Small amount of redness noted to forearm.  [name] [name]

## 2015-10-02 LAB — WOUND CULTURE

## 2015-10-02 NOTE — Progress Notes (Signed)
Social Work Discharge Note  The overall goal for the admission was met for:   Discharge location: Yes - home  Length of Stay: Yes - 14 days  Discharge activity level: Yes - supervision  Home/community participation: Yes  Services provided included: MD, RD, PT, OT, SLP, RN, Pharmacy and SW  Financial Services: Medicaid  Follow-up services arranged: Home Health: RN, DME: rolling walker and tub bench and Patient/Family request agency HH: [facility], DME: [facility]  Comments (or additional information): Pt is returning to her home with her husband and family/friends to provide  supervision.  Pt has done well on rehab and is pleased with progress, but will still need supervision overall.  Pt's dtr and husband received family education.  Pt with DME for home use and RN only for home care.  Pt did not meet the Medicaid criteria to have therapies at home.  CIR therapists have given pt a home exercise program and she knows what she needs to work on.  CSW available via telephone if needs arise.  Pt is pleased to be going home.   Patient/Family verbalized understanding of follow-up arrangements: Yes  Individual responsible for coordination of the follow-up plan: pt with her family  Confirmed correct DME delivered: Trey Sailors 10/02/2015    Melanye Hiraldo, [name]

## 2015-10-03 ENCOUNTER — Telehealth: Payer: Self-pay

## 2015-10-03 NOTE — Telephone Encounter (Signed)
  1. Are you/is patient experiencing any problems since coming home? Are there any questions regarding any aspect of care? No  2. Are there any questions regarding medications administration/dosing? Are meds being taken as prescribed? Patient should review meds with caller to confirm.  Medications have been confirmed.  3. Have there been any falls? No 4. Has Home Health been to the house and/or have they contacted you? If not, have you tried to contact them? Can we help you contact them? Home Health RN  5. Are bowels and bladder emptying properly? Are there any unexpected incontinence issues? If applicable, is patient following bowel/bladder programs? No 6. Any fevers, problems with breathing, unexpected pain? No 7. Are there any skin problems or new areas of breakdown? No 8. Has the patient/family member arranged specialty MD follow up (ie cardiology/neurology/renal/surgical/etc)?  Can we help arrange? Yes, follow up's have been arranged,  9. Does the patient need any other services or support that we can help arrange? No 10. Are caregivers following through as expected in assisting the patient? Yes Has the patient quit smoking, drinking alcohol, or using drugs as recommended? Pt is not smoking, drinking, or using drugs.   Pt is aware of appointment of 10/10/15 @ 12:45. New pt packet mailed.

## 2015-10-06 ENCOUNTER — Telehealth: Payer: Self-pay | Admitting: *Deleted

## 2015-10-06 NOTE — Telephone Encounter (Signed)
"  I was recently discharged from the hospital, need an appointment for flush and see Dr. Burr Medico."  Scheduled 10-20-2015 beginning with Dr. Lisbeth Renshaw RT.  These appointments dates and times provided. No further questions,

## 2015-10-10 ENCOUNTER — Encounter: Payer: Self-pay | Admitting: Physical Medicine & Rehabilitation

## 2015-10-10 ENCOUNTER — Encounter: Payer: Medicaid Other | Attending: Physical Medicine & Rehabilitation | Admitting: Physical Medicine & Rehabilitation

## 2015-10-10 VITALS — BP 128/74 | HR 123 | Resp 14

## 2015-10-10 DIAGNOSIS — L03113 Cellulitis of right upper limb: Secondary | ICD-10-CM | POA: Insufficient documentation

## 2015-10-10 DIAGNOSIS — L02413 Cutaneous abscess of right upper limb: Secondary | ICD-10-CM

## 2015-10-10 DIAGNOSIS — Z86711 Personal history of pulmonary embolism: Secondary | ICD-10-CM | POA: Insufficient documentation

## 2015-10-10 DIAGNOSIS — C189 Malignant neoplasm of colon, unspecified: Secondary | ICD-10-CM | POA: Diagnosis not present

## 2015-10-10 DIAGNOSIS — R011 Cardiac murmur, unspecified: Secondary | ICD-10-CM | POA: Diagnosis not present

## 2015-10-10 DIAGNOSIS — Z87891 Personal history of nicotine dependence: Secondary | ICD-10-CM | POA: Insufficient documentation

## 2015-10-10 DIAGNOSIS — I2699 Other pulmonary embolism without acute cor pulmonale: Secondary | ICD-10-CM | POA: Diagnosis not present

## 2015-10-10 DIAGNOSIS — R270 Ataxia, unspecified: Secondary | ICD-10-CM | POA: Insufficient documentation

## 2015-10-10 DIAGNOSIS — C7931 Secondary malignant neoplasm of brain: Secondary | ICD-10-CM | POA: Insufficient documentation

## 2015-10-10 DIAGNOSIS — R531 Weakness: Secondary | ICD-10-CM | POA: Insufficient documentation

## 2015-10-10 DIAGNOSIS — E871 Hypo-osmolality and hyponatremia: Secondary | ICD-10-CM

## 2015-10-10 DIAGNOSIS — C19 Malignant neoplasm of rectosigmoid junction: Secondary | ICD-10-CM | POA: Diagnosis not present

## 2015-10-10 DIAGNOSIS — C787 Secondary malignant neoplasm of liver and intrahepatic bile duct: Secondary | ICD-10-CM

## 2015-10-10 NOTE — Addendum Note (Signed)
Addended by: Delice Lesch A on: 10/10/2015 01:29 PM   Modules accepted: Level of Service

## 2015-10-10 NOTE — Progress Notes (Signed)
Subjective:    Patient ID: [name] [name], female    DOB: 1951-04-15, 65 y.o.   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 DATE OF ADMISSION:  09/17/2015 DATE OF DISCHARGE:  10/01/2015 Since discharge, she has been doing very well.  She denies falls.  She has spoke to Oncology, who informed pt she no longer needs to be on decadron.  She is still on Xarelto for PE.  She notes her blood work is being checked at home.  Her oral ulcers have resolved.  Her right dorsal forearm has minimal drainage. She sees Oncology on 4/17.  She has not called Dr. Grandville Silos for follow up of drainage of abscess.    DME: Did not require Mobility: Walker, but does not need it. Therapies: None offered by insurance, pt does home exercise program  Pain Inventory Average Pain 0 Pain Right Now 0 My pain is no pain  In the last 24 hours, has pain interfered with the following? General activity 0 Relation with others 0 Enjoyment of life 0 What TIME of day is your pain at its worst? morning Sleep (in general) Good  Pain is worse with: no pain Pain improves with: no pain Relief from Meds: 0  Mobility walk without assistance how many minutes can you walk? 60 do you drive?  no  Function disabled: date disabled . retired Do you have any goals in this area?  no  Neuro/Psych No problems in this area  Prior Studies Any changes since last visit?  no  Physicians involved in your care Any changes since last visit?  no   Family History  Problem Relation Age of Onset  . Colon cancer Sister 39  . Diabetes Neg Hx   . Thyroid disease Neg Hx   . Breast cancer Maternal Aunt   . Prostate  cancer Maternal Uncle   . Bone cancer Maternal Grandfather   . Prostate cancer Maternal Uncle    Social History   Social History  . Marital Status: Married    Spouse Name: N/A  . Number of Children: 3  . Years of Education: N/A   Occupational History  .     Social History Main Topics  . Smoking status: Former Smoker -- 1 years    Types: Cigarettes    Quit date: 05/24/1971  . Smokeless tobacco: Never Used  . Alcohol Use: No  . Drug Use: No  . Sexual Activity: Not Asked   Other Topics Concern  . None   Social History Narrative   Past Surgical History  Procedure Laterality Date  . Abdominal hysterectomy  2005  . Cesarean section  1976  . Flexible sigmoidoscopy N/A 05/25/2013    Procedure: FLEXIBLE SIGMOIDOSCOPY;  Surgeon: [name], MD;  Location: [facility];  Service: Endoscopy;  Laterality: N/A;  needs floro  . Colonic stent placement N/A 05/25/2013    Procedure: COLONIC STENT PLACEMENT;  Surgeon: [name], MD;  Location: [facility];  Service: Endoscopy;  Laterality: N/A;  . Portacath placement Right 06/25/2013    Procedure: ULTRA SOUND GUIDED INSERTION PORT-A-CATH;  Surgeon: [name], MD;  Location: [place];  Service: General;  Laterality: Right;  . Flexible sigmoidoscopy  N/A 08/19/2014    Procedure: FLEXIBLE SIGMOIDOSCOPY;  Surgeon: Gatha Mayer, MD;  Location: [facility];  Service: Endoscopy;  Laterality: N/A;  . Colonic stent placement N/A 08/19/2014    Procedure: COLONIC STENT PLACEMENT;  Surgeon: Gatha Mayer, MD;  Location: [facility];  Service: Endoscopy;  Laterality: N/A;  . Flexible sigmoidoscopy N/A 08/19/2014    Procedure: FLEXIBLE SIGMOIDOSCOPY;  Surgeon: Gatha Mayer, MD;  Location: [facility];  Service: Endoscopy;  Laterality: N/A;  . Colonic stent placement N/A 08/19/2014    Procedure: COLONIC STENT PLACEMENT;  Surgeon: Gatha Mayer, MD;  Location: [facility];  Service: Endoscopy;  Laterality: N/A;    . Colon resection N/A 10/28/2014    Procedure: Transverse loop colostomy;  Surgeon: [name], MD;  Location: [facility];  Service: General;  Laterality: N/A;  . Colostomy revision N/A 01/03/2015    Procedure: REVISION OF TRANSVERSE LOOP COLOSTOMY WITH PARTIAL  COLECTOMY;  Surgeon: [name], MD;  Location: [facility];  Service: General;  Laterality: N/A;  . Laparoscopic low anterior resection  03/06/2015    03/06/2015  . Laparoscopic splenectomy  03/06/2015  . Colon resection N/A 03/06/2015    Procedure: LAPAROSCOPIC takedown loop colostomy ;  Surgeon: Michael Boston, MD;  Location: [facility];  Service: General;  Laterality: N/A;  . 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 Bowel resection  03/06/2015    Procedure: LOW ANTERIOR BOWEL RESECTION, rigid proctoscopy;  Surgeon: Michael Boston, MD;  Location: [facility];  Service: General;;  . Polypectomy  03/06/2015    Procedure: transverse POLYPECTOMY x 4;  Surgeon: Michael Boston, MD;  Location: [facility];  Service: General;;   Past Medical History  Diagnosis Date  . Colon cancer ([place])     dx'd 2014  . Heart murmur     ? as a child   . Pulmonary embolism ([place]) 11/2014   . Anemia     hx of    BP 128/74 mmHg  Pulse 123  Resp 14  SpO2 99%  Opioid Risk Score:   Fall Risk Score:  `1  Depression screen PHQ 2/9  Depression screen [facility] 2/9 10/10/2015 02/25/2014 02/25/2014 01/14/2014  Decreased Interest 0 0 0 0  Down, Depressed, Hopeless 0 0 - 0  PHQ - 2 Score 0 0 0 0  Altered sleeping 0 - - -  Tired, decreased energy 0 - - -  Change in appetite 0 - - -  Feeling bad or failure about yourself  0 - - -  Trouble concentrating 0 - - -  Moving slowly or fidgety/restless 0 - - -  Suicidal thoughts 0 - - -  PHQ-9 Score 0 - - -  Difficult doing work/chores Not difficult at all - - -   Review of Systems  Constitutional: Negative for fever and chills.   Neurological: Negative for weakness and numbness.  All other systems reviewed and are negative.     Objective:   Physical Exam Constitutional: She appears well-developed.  NAD. Vital signs reviewed.   HENT:  Normocephalic and atraumatic.   Mouth: Moist oral mucosa.  No ulcers.  Eyes: Conjunctivae and EOM are normal.   Cardiovascular: Normal rate and regular rhythm. No murmur heard. Respiratory: Effort normal and breath sounds normal. No stridor. No respiratory distress. She has no wheezes.   GI: Soft. Bowel sounds are normal. She exhibits no distension. There  is no tenderness.  Musculoskeletal: She exhibits no edema or tenderness.  Neurological: She is alert and oriented. Speech clear.  Motor LUE/LLE 5/5.   RUE: 5/5 proximal to distal RLE: HF 5/5, KE 5/5, PF/DF 5/5.  Skin: Skin is warm and dry. Right dorsal forearm with mild sanguinous drainage with dressing intact. No foul odor. Psychiatric: She has a normal mood and affect. Her behavior is normal. Judgment and thought content normal    Assessment & Plan:  65 year old right-handed female, history of stage IV colon cancer with metastasis to the liver and peritoneum who declined chemotherapy due to concerns for SE, anemia, pulmonary emboli on Xarelto presents for transitional care management after being discharged from CIR for right sided weakness/ataxia secondary to left brain mets from colon CA.  1.  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   S/p stereotactic treatment on 09/18/15  Completed steroids  Follow-up per radiation oncology Dr. Patric Dykes and oncology services Dr. Burr Medico    Cont home excercises             2. PE:   Cont Xarelto  3. Electrolytes  Pt notes recent lab draw by [facility]  Follow up with PCP regarding results of hyponatremia and hypercalcemia and treat if necessary  4. Cellulitis with superimposed subcutaneous abscess of right dorsal forearm  Lanced on 3/27,  appreciate surgery involvement. Completed abx  Cont HH  Cont dressing changes daily  Pt to follow up with Ortho surgery             No signs or symptoms of infection, afebrile              Referrals reviewed Meds reviewed All questions answered.

## 2015-10-10 NOTE — Patient Instructions (Addendum)
Call to schedule follow up appointment with Dr. Milly Jakob, Orthopedic Surgery for right arm wound.

## 2015-10-13 NOTE — Progress Notes (Incomplete)
°  Radiation Oncology         (336) [phone] ________________________________  Name: [name] [name] MRN: [id number]  Date: 09/18/2015  DOB: 11-23-1950  End of Treatment Note  Diagnosis:   Brain metastases     Indication for treatment:  Curative       Radiation treatment dates:   09/18/15  Site/dose:   Left frontoparietal lesion treated to 20 Gy in 1 fractions  Beams/energy:   SBRT/SRT-3D , 6FFF  Narrative: The patient tolerated radiation treatment relatively well.     Plan: The patient has completed radiation treatment. The patient will return to radiation oncology clinic for routine followup in one month. I advised them to call or return sooner if they have any questions or concerns related to their recovery or treatment.  ------------------------------------------------  Jodelle Gross, MD, PhD  This document serves as a record of services personally performed by Kyung Rudd, MD. It was created on his behalf by Derek Mound, a trained medical scribe. The creation of this record is based on the scribe's personal observations and the provider's statements to them. This document has been checked and approved by the attending provider.

## 2015-10-16 DIAGNOSIS — C189 Malignant neoplasm of colon, unspecified: Secondary | ICD-10-CM | POA: Diagnosis not present

## 2015-10-16 DIAGNOSIS — C786 Secondary malignant neoplasm of retroperitoneum and peritoneum: Secondary | ICD-10-CM | POA: Diagnosis not present

## 2015-10-16 DIAGNOSIS — C787 Secondary malignant neoplasm of liver and intrahepatic bile duct: Secondary | ICD-10-CM | POA: Diagnosis not present

## 2015-10-16 DIAGNOSIS — C7931 Secondary malignant neoplasm of brain: Secondary | ICD-10-CM | POA: Diagnosis not present

## 2015-10-20 ENCOUNTER — Ambulatory Visit (HOSPITAL_BASED_OUTPATIENT_CLINIC_OR_DEPARTMENT_OTHER): Payer: Medicaid Other

## 2015-10-20 ENCOUNTER — Ambulatory Visit
Admission: RE | Admit: 2015-10-20 | Discharge: 2015-10-20 | Disposition: A | Discharge status: Home / Self Care | Payer: Medicaid Other | Source: Ambulatory Visit | Attending: Radiation Oncology | Admitting: Radiation Oncology

## 2015-10-20 ENCOUNTER — Other Ambulatory Visit (HOSPITAL_BASED_OUTPATIENT_CLINIC_OR_DEPARTMENT_OTHER): Payer: Medicaid Other

## 2015-10-20 ENCOUNTER — Encounter: Payer: Self-pay | Admitting: Hematology

## 2015-10-20 ENCOUNTER — Ambulatory Visit (HOSPITAL_BASED_OUTPATIENT_CLINIC_OR_DEPARTMENT_OTHER): Payer: Medicaid Other | Admitting: Hematology

## 2015-10-20 ENCOUNTER — Encounter: Payer: Self-pay | Admitting: Radiation Oncology

## 2015-10-20 ENCOUNTER — Telehealth: Payer: Self-pay | Admitting: Hematology

## 2015-10-20 VITALS — BP 133/82 | HR 107 | Temp 98.5°F | Resp 20 | Ht 65.5 in | Wt 148.1 lb

## 2015-10-20 VITALS — BP 128/59 | HR 138 | Temp 98.4°F | Ht 65.5 in | Wt 148.4 lb

## 2015-10-20 DIAGNOSIS — Z95828 Presence of other vascular implants and grafts: Secondary | ICD-10-CM

## 2015-10-20 DIAGNOSIS — D638 Anemia in other chronic diseases classified elsewhere: Secondary | ICD-10-CM

## 2015-10-20 DIAGNOSIS — D500 Iron deficiency anemia secondary to blood loss (chronic): Secondary | ICD-10-CM | POA: Diagnosis present

## 2015-10-20 DIAGNOSIS — C19 Malignant neoplasm of rectosigmoid junction: Secondary | ICD-10-CM

## 2015-10-20 DIAGNOSIS — C786 Secondary malignant neoplasm of retroperitoneum and peritoneum: Secondary | ICD-10-CM

## 2015-10-20 DIAGNOSIS — Z86718 Personal history of other venous thrombosis and embolism: Secondary | ICD-10-CM | POA: Diagnosis not present

## 2015-10-20 DIAGNOSIS — C7931 Secondary malignant neoplasm of brain: Secondary | ICD-10-CM

## 2015-10-20 DIAGNOSIS — C787 Secondary malignant neoplasm of liver and intrahepatic bile duct: Secondary | ICD-10-CM | POA: Diagnosis not present

## 2015-10-20 DIAGNOSIS — D6481 Anemia due to antineoplastic chemotherapy: Secondary | ICD-10-CM

## 2015-10-20 DIAGNOSIS — I2699 Other pulmonary embolism without acute cor pulmonale: Secondary | ICD-10-CM

## 2015-10-20 LAB — COMPREHENSIVE METABOLIC PANEL
ALT: 14 U/L (ref 0–55)
AST: 16 U/L (ref 5–34)
Albumin: 3.3 g/dL — ABNORMAL LOW (ref 3.5–5.0)
Alkaline Phosphatase: 79 U/L (ref 40–150)
Anion Gap: 7 mEq/L (ref 3–11)
BUN: 11.2 mg/dL (ref 7.0–26.0)
CALCIUM: 12.3 mg/dL — AB (ref 8.4–10.4)
CHLORIDE: 107 meq/L (ref 98–109)
CO2: 24 meq/L (ref 22–29)
CREATININE: 0.7 mg/dL (ref 0.6–1.1)
EGFR: >90 mL/min/{1.73_m2} (ref >90)
Glucose: 106 mg/dl (ref 70–140)
Potassium: 4.3 mEq/L (ref 3.5–5.1)
Sodium: 137 mEq/L (ref 136–145)
Total Bilirubin: <0.3 mg/dL (ref 0.20–1.20)
Total Protein: 7.1 g/dL (ref 6.4–8.3)

## 2015-10-20 LAB — IRON AND TIBC
%SAT: 20 % — AB (ref 21–57)
Iron: 62 ug/dL (ref 41–142)
TIBC: 314 ug/dL (ref 236–444)
UIBC: 252 ug/dL (ref 120–384)

## 2015-10-20 LAB — CBC & DIFF AND RETIC
BASO%: 0 % (ref 0.0–2.0)
BASOS ABS: 0 10*3/uL (ref 0.0–0.1)
EOS%: 1.2 % (ref 0.0–7.0)
Eosinophils Absolute: 0 10*3/uL (ref 0.0–0.5)
HEMATOCRIT: 31.3 % — AB (ref 34.8–46.6)
HGB: 10.3 g/dL — ABNORMAL LOW (ref 11.6–15.9)
IMMATURE RETIC FRACT: 5.2 % (ref 1.60–10.00)
LYMPH#: 1 10*3/uL (ref 0.9–3.3)
LYMPH%: 30.2 % (ref 14.0–49.7)
MCH: 28 pg (ref 25.1–34.0)
MCHC: 32.9 g/dL (ref 31.5–36.0)
MCV: 85.1 fL (ref 79.5–101.0)
MONO#: 0.2 10*3/uL (ref 0.1–0.9)
MONO%: 5.3 % (ref 0.0–14.0)
NEUT#: 2.2 10*3/uL (ref 1.5–6.5)
NEUT%: 63.3 % (ref 38.4–76.8)
Platelets: 307 10*3/uL (ref 145–400)
RBC: 3.68 10*6/uL — AB (ref 3.70–5.45)
RDW: 17 % — AB (ref 11.2–14.5)
RETIC %: 1.81 % (ref 0.70–2.10)
RETIC CT ABS: 66.61 10*3/uL (ref 33.70–90.70)
WBC: 3.4 10*3/uL — AB (ref 3.9–10.3)

## 2015-10-20 LAB — FERRITIN: Ferritin: 39 ng/ml (ref 9–269)

## 2015-10-20 MED ORDER — HEPARIN SOD (PORK) LOCK FLUSH 100 UNIT/ML IV SOLN
500.0000 [IU] | Freq: Once | INTRAVENOUS | Status: AC
  Administered 2015-10-20: 500 [IU] via INTRAVENOUS
  Filled 2015-10-20: qty 5

## 2015-10-20 MED ORDER — SODIUM CHLORIDE 0.9% FLUSH
10.0000 mL | INTRAVENOUS | Status: DC | PRN
  Administered 2015-10-20: 10 mL via INTRAVENOUS
  Filled 2015-10-20: qty 10

## 2015-10-20 NOTE — Progress Notes (Signed)
Radiation Oncology         (336) [phone] ________________________________  Name: [name] [name] MRN: [id number]  Date: 10/20/2015  DOB: [birth year]  Follow-Up Visit Note  CC: Kristine Garbe, MD  [facility]  Diagnosis: Brain metastasis  Interval Since Last Radiation: 1 month  09/18/15: Left frontoparietal lesion target was treated using 2 Arcs to a prescription dose of 20 Gy. ExacTrac Snap verification was performed for each couch angle.   Narrative:  The patient returns today for routine follow-up since her radiotherapy and has completed her dexamethasone taper.    On review of systems, the patient reports that she is doing well overall. She denies any chest pain, shortness of breath, cough, fevers, chills, night sweats, unintended weight changes. She denies any bowel or bladder disturbances, and denies abdominal pain, nausea or vomiting. She denies any new musculoskeletal or joint aches or pains. She has been ambulating well with a walker at home, and progressing through. Please with her progress thus far and reports sensation in her lower extremities are completely resolved. A complete review of systems is obtained and is otherwise negative.   ALLERGIES:  is allergic to oxycontin and codeine.  Meds: Current Outpatient Prescriptions  Medication Sig Dispense Refill  . acetaminophen (TYLENOL) 500 MG tablet Take 500 mg by mouth every 6 (six) hours as needed for moderate pain or headache.     . Cyanocobalamin (VITAMIN B 12 PO) Take 1 tablet by mouth every morning.    . IRON PO Take 1 tablet by mouth every morning.    . polyethylene glycol powder (GLYCOLAX/MIRALAX) powder TAKE 34 G (2 DOSES) BY MOUTH DAILY AS NEEDED FOR MILD CONSTIPATION OR MODERATE CONSTIPATION 255 g 0  . rivaroxaban (XARELTO) 20 MG TABS tablet Take 1 tablet (20 mg total) by mouth daily with supper. 30 tablet 3   No current facility-administered medications for this encounter.    Physical Findings:  height is  5' 5.5" (1.664 m) and weight is 148 lb 6.4 oz (67.314 kg). Her temperature is 98.4 F (36.9 C). Her blood pressure is 128/59 and her pulse is 138.   Pain scale 0/10 In general this is a well appearing African-American female in no acute distress, ambulating with a walker. She's alert and oriented x4 and appropriate throughout the examination. Cardiopulmonary assessment is negative for acute distress and she exhibits normal effort.    Lab Findings: Lab Results  Component Value Date   WBC 12.1* 09/29/2015   HGB 12.3 09/29/2015   HCT 37.3 09/29/2015   MCV 81.1 09/29/2015   PLT 341 09/29/2015     Radiographic Findings: Dg Chest Port 1 View  09/25/2015  CLINICAL DATA:  Leukocytosis. Fall 2 weeks ago. Stage for colon cancer. Nodules on chest x-ray from 2014. EXAM: PORTABLE CHEST 1 VIEW COMPARISON:  01/01/2015 FINDINGS: Right IJ Port-A-Cath unchanged. Lungs are adequately inflated without consolidation or effusion. Cardiomediastinal silhouette is within normal. Minimal curvature of the thoracic spine convex right with mild degenerative change. IMPRESSION: No active disease. Electronically Signed   By: Marin Olp M.D.   On: 09/25/2015 11:10    Impression/Plan:  1. Stage IV colorectal cancer with brain metastasis. Clinically, the patient appears to be doing very well, and has made significant recovery since her brain metastasis was noted. The patient will see Dr. Burr Medico after this encounter for further discussion of additional treatmnet. We will order an MRI of the brain in a two months and she will follow up with [place] afterwards.  Carola Rhine, PAC  This document serves as a record of services personally performed by Kyung Rudd, MD. It was created on his behalf by Darcus Austin, a trained medical scribe. The creation of this record is based on the scribe's personal observations and the provider's statements to them. This document has been checked and approved by the attending provider.

## 2015-10-20 NOTE — Progress Notes (Signed)
[place] [facility] ONCOLOGY OFFICE PROGRESS NOTE   DIAGNOSIS: Metastatic colon cancer to liver and peritoneum  Chief complaint: Follow-up colon cancer  CURRENT TREATMENT:  Observation  Oncology History   Colorectal cancer, stage IV, obstructing s/p colonic diversion   Staging form: Colon and Rectum, AJCC 7th Edition     Clinical: Stage IVA (TX, N0, M1a) - Signed by Concha Norway, MD on 09/15/2013       Colorectal cancer, stage IV, obstructing s/p stent x2, ostomy, then [facility] 03/06/2015   05/22/2013 - 05/27/2013 Hospital Admission A/w abdominal distention and constipation for 2 days CT scan of her abdomen and pelvis showed area of bowel wall thickening at the rectosigmoid junction concerning for malignancy.   05/22/2013 Imaging CT of Abdomen: bowel wall thickening with shouldering of the proximal and distal margins, involving therectosigmoid junction, supicous liver spleen mets. Colonsocopy recommended.    05/23/2013 Tumor Marker CEA 225.7   05/24/2013 Imaging MRI of abdomen: 3.7 x 4.4 cm enhancing lesion along the superior spleen, indeterminate but worrisome for metastasis.2.7 x 3.9 cm enhancing lesion in the medial segment left hepatic lobe,    05/25/2013 Pathology Results Diagnosis Rectum, biopsy, proximal - INVASIVE ADENOCARCINOMA.   05/25/2013 Procedure Flex sig: (Dr. Ardis Hughs). 5-6 cm obstructing rectosigmoid mass distal end 10 cm from anal verge; mass biopsed and then stented 9 cm long, 22 cm diameter uncovered SEM.    05/25/2013 Initial Diagnosis Colorectal cancer, stage IV   06/25/2013 Procedure R port a cath placement.   07/09/2013 - 08/20/2013 Chemotherapy FOLFOX q 2 weeks started.  Not elgible for clinical trial and bevacimab based on stent and high risk for perforation. She received a total of 4 cycles.    07/30/2013 Tumor Marker KRAS positive.  p53, APC identified. Mismatch Repair (MMR) preserved.    08/06/2013 Adverse Reaction Complains of darkening of her hands with peeling.    08/27/2013 Family History She was seen by Genetic couselor.  She declined testing today, but will call and reschedule an appointment should she decide to pursue testing.   09/07/2013 Imaging CT abdomen: Mixed response to therapy. Response to therapy of hepatic metastasis. right hepatic hemangiomas are again identified. Other too small to characterize liver lesions are felt to be similar but are indeterminate.. Enlargement of a splenic lesions   09/13/2013 - 06/10/2014 Chemotherapy Second line chemo FOLFIRI for a total of 16 cycles    09/13/2013 Tumor Marker CEA 33.5   09/13/2013 Treatment Plan Change Reviewed scans consistent with mixed response.  Switch to FOLFIRI, she received a total of 16 cycles, with a few breaks due to hospitalization and chemo holiday.    10/29/2013 Adverse Reaction Patient reports intense abdominal cramping lasting the first few days on chemotherapy.  Starting Hyoscyamine 0.125 mg q 6 hours prn.    11/15/2013 - 11/15/2013 Hospital Admission Patient presented to ED with migrated stent.  Stent retrieved by Dr. Ardis Hughs.  Imaging negative for perforation.   Discussed in conference with referral to Dr. Zella Richer made.    11/23/2013 Imaging CT Chest. 1. Stable left lower lobe 5 mm pulmonary nodule. 2. Interval increase in size splenic mass is concerning formetastatic lesion.3. Interval calcification of the left hepatic lobe metastasis. Noevidence of active residual disease by CT.   07/01/2014 Imaging CT restaging scan showed stable disease, improved liver lesion.    07/16/2014 - 10/24/2014 Chemotherapy maintenance chemo with capacitain 1078m/m2 (18045m bid started, dose decresed to 150021mid on C1D8 due to tolerance issue. Pt declined surgery. Chemo held  due to hospitalization and disease progression.    08/16/2014 - 08/20/2014 Hospital Admission She was admitted for bowel obstruction from sigmoid colon mass. She declined surgery, and had sigmoid colon stent placement. Her symptoms resolved  afterwards.   10/25/2014 - 10/31/2014 Hospital Admission Admitted for bowel obstruction, CT scan showed sigmoid colon tumor growth through the stent, pt underwent Transverse loop colostomy on 10/28/2014.    11/13/2014 - 11/18/2014 Hospital Admission she was admitted for transverse loop colostomy prolapse, managed conservatively. CT chest showed RLL PE, discharged home on Xarelto    01/03/2015 - 01/07/2015 Hospital Admission palliative repair of loop colostomy prolapse with partial colectomy 01/03/15   03/05/2015 Imaging CT chest, abdomen and pelvis showed known colorectal  Them, multiple new liver metastasis,. Noticed enhancing splenic lesion continues to enlarge, nonobstructive calculus in the left renal collecting system.   03/06/2015 Surgery Laparoscopic low anterior rectosigmoid to resection, splenectomy, takedown loop colostomy, vaginal cuff peritoneal biopsy, colon polypectomyx4    03/06/2015 Pathology Results Well differentiated adenocarcinoma in rectosigmoid colon, 32 lymph nodes were negative, vaginal cuff biopsy was positive for metastatic adenocarcinoma, [place] with plasma cell infiltrate and fibrosis, no metastatic carcinoma.   09/12/2015 - 09/17/2015 Hospital Admission Pt presented with b/l leg weakness, was admitted for brain metastasis    09/13/2015 Imaging Brain MRI with and without contrast showed a 1.7 cm left frontoparietal mass with moderate vasogenic edema, consistent with solitary metastasis   09/18/2015 - 09/18/2015 Radiation Therapy SRS to solitary brain met    09/18/2015 -  Radiation Therapy Stereotactic radiation to oligo brain metastases   CURRENT THERAPY: Observation  INTERVAL HISTORY:  [name] [name] 65 y.o. female with a history of Stage IV colon cancer is here for follow-up. She was hospitalized in early March for bilateral leg weakness, workup revealed a 1.7 cm left frontoparietal mass. She received stereotactic radiation to the brain metastasis on March 16, and tolerated procedure very  well. She was in the rehabilitation for 2 weeks for intense rehabilitation, and was subsequently discharged home. She has been recovering well, is able to move around with a walker. She has good spirits, denies any significant pain, except occasional left pelvic/hip pain, no abdominal discomfort, nausea, or other symptoms. She has good appetite and eating well.  Past Medical History  Diagnosis Date  . Colon cancer ([place])     dx'd 2014  . Heart murmur     ? as a child   . Pulmonary embolism ([place]) 11/2014   . Anemia     hx of     ALLERGIES:  is allergic to oxycontin and codeine.  MEDICATIONS: has a current medication list which includes the following prescription(s): acetaminophen, cyanocobalamin, iron, polyethylene glycol powder, and rivaroxaban.  SURGICAL HISTORY:  Past Surgical History  Procedure Laterality Date  . Abdominal hysterectomy  2005  . Cesarean section  1976  . Flexible sigmoidoscopy N/A 05/25/2013    Procedure: FLEXIBLE SIGMOIDOSCOPY;  Surgeon: [name], MD;  Location: [facility];  Service: Endoscopy;  Laterality: N/A;  needs floro  . Colonic stent placement N/A 05/25/2013    Procedure: COLONIC STENT PLACEMENT;  Surgeon: [name], MD;  Location: [facility];  Service: Endoscopy;  Laterality: N/A;  . Portacath placement Right 06/25/2013    Procedure: ULTRA SOUND GUIDED INSERTION PORT-A-CATH;  Surgeon: [name], MD;  [facility];  Service: General;  Laterality: Right;  . Flexible sigmoidoscopy N/A 08/19/2014    Procedure: FLEXIBLE SIGMOIDOSCOPY;  Surgeon: Gatha Mayer,  MD;  Location: [facility];  Service: Endoscopy;  Laterality: N/A;  . Colonic stent placement N/A 08/19/2014    Procedure: COLONIC STENT PLACEMENT;  Surgeon: Gatha Mayer, MD;  Location: [facility];  Service: Endoscopy;  Laterality: N/A;  . Flexible sigmoidoscopy N/A 08/19/2014    Procedure: FLEXIBLE SIGMOIDOSCOPY;  Surgeon: Gatha Mayer, MD;  Location:  [facility];  Service: Endoscopy;  Laterality: N/A;  . Colonic stent placement N/A 08/19/2014    Procedure: COLONIC STENT PLACEMENT;  Surgeon: Gatha Mayer, MD;  Location: [facility];  Service: Endoscopy;  Laterality: N/A;  . Colon resection N/A 10/28/2014    Procedure: Transverse loop colostomy;  Surgeon: [name], MD;  Location: [facility];  Service: General;  Laterality: N/A;  . Colostomy revision N/A 01/03/2015    Procedure: REVISION OF TRANSVERSE LOOP COLOSTOMY WITH PARTIAL  COLECTOMY;  Surgeon: [name], MD;  Location: [facility];  Service: General;  Laterality: N/A;  . Laparoscopic low anterior resection  03/06/2015    03/06/2015  . Laparoscopic splenectomy  03/06/2015  . Colon resection N/A 03/06/2015    Procedure: LAPAROSCOPIC takedown loop colostomy ;  Surgeon: Michael Boston, MD;  Location: [facility];  Service: General;  Laterality: N/A;  . 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 Bowel resection  03/06/2015    Procedure: LOW ANTERIOR BOWEL RESECTION, rigid proctoscopy;  Surgeon: Michael Boston, MD;  Location: [facility];  Service: General;;  . Polypectomy  03/06/2015    Procedure: transverse POLYPECTOMY x 4;  Surgeon: Michael Boston, MD;  Location: [facility];  Service: General;;    REVIEW OF SYSTEMS:   Constitutional: Denies fevers, chills or abnormal weight loss Eyes: Denies blurriness of vision Ears, nose, mouth, throat, and face: Denies mucositis or sore throat Respiratory: Denies cough, dyspnea or wheezes Cardiovascular: Denies palpitation, chest discomfort or lower extremity swelling Gastrointestinal:  Denies nausea, heartburn or change in bowel habits Skin: Denies abnormal skin rashes Lymphatics: Denies new lymphadenopathy or easy bruising Neurological:Denies numbness, tingling or new weaknesses Behavioral/Psych: Mood is stable, no new changes  All other systems were reviewed with the  patient and are negative.  PHYSICAL EXAMINATION: ECOG PERFORMANCE STATUS: 3 Blood pressure 133/82, pulse 107, temperature 98.5 F (36.9 C), temperature source Oral, resp. rate 20, height 5' 5.5" (1.664 m), weight 148 lb 1.6 oz (67.178 kg), SpO2 100 %. GENERAL:alert, no distress and comfortable; well-developed, well nourished.  SKIN: skin color, texture, turgor are normal, no rashes or significant lesions; + R Port, hyperpigmentation of her hands.  EYES: normal, Conjunctiva are pink and non-injected, sclera clear  OROPHARYNX:no exudate, no erythema and lips, buccal mucosa  NECK: supple, thyroid normal size, non-tender, without nodularity  LYMPH: no palpable lymphadenopathy in the cervical, axillary or supraclavicular  LUNGS: clear to auscultation and percussion with normal breathing effort  HEART: Tachycardic with regular rhythm and no murmurs and no lower extremity edema  ABDOMEN:abdomen soft, non-tender and normal bowel sounds. Well-healed surgical scar in the right upper quadrant.  Bowel sounds normal.   Musculoskeletal:no cyanosis of digits and no clubbing  NEURO: alert & oriented x 3 with fluent speech, slightly weakness on the right lower extremity, she is able to walk independently with a walker. EXTREMITIES: no leg swollen.  Labs:  CBC Latest Ref Rng 09/29/2015 09/26/2015 09/25/2015  WBC 4.0 - 10.5 K/uL 12.1(H) 14.0(H) 13.5(H)  Hemoglobin 12.0 - 15.0 g/dL 12.3 12.7  12.0  Hematocrit 36.0 - 46.0 % 37.3 38.5 35.3(L)  Platelets 150 - 400 K/uL 341 387 412(H)   CMP Latest Ref Rng 09/29/2015 09/26/2015 09/25/2015  Glucose 65 - 99 mg/dL 168(H) 114(H) 127(H)  BUN 6 - 20 mg/dL '17 16 15  '$ Creatinine 0.44 - 1.00 mg/dL 0.79 0.79 0.72  Sodium 135 - 145 mmol/L 132(L) 132(L) 133(L)  Potassium 3.5 - 5.1 mmol/L 4.5 4.9 5.0  Chloride 101 - 111 mmol/L 103 102 103  CO2 22 - 32 mmol/L 17(L) 22 24  Calcium 8.9 - 10.3 mg/dL 11.7(H) 12.0(H) 12.0(H)  Total Protein 6.5 - 8.1 g/dL - - -  Total Bilirubin 0.3  - 1.2 mg/dL - - -  Alkaline Phos 38 - 126 U/L - - -  AST 15 - 41 U/L - - -  ALT 14 - 54 U/L - - -    PATHOLOGY REPORT Diagnosis 03/06/2015 1. Vagina, biopsy, vaginal cuff - METASTATIC ADENOCARCINOMA, SEE COMMENT. 2. Colon, polyp(s), transverse - TUBULAR ADENOMA WITH HIGH GRADE DYSPLASIA (X2), SEE COMMENT. - TUBULAR ADENOMA (X2). - NO INVASIVE CARCINOMA IDENTIFIED. 3. Colon, colostomy stoma - FINDINGS CONSISTENT WITH COLOSTOMY STOMA. - NO MALIGNANCY IDENTIFIED. 4. Colon, segmental resection for tumor, rectosigmoid cancer - INVASIVE ADENOCARCINOMA, WELL DIFFERENTIATED, SPANNING 7.6 CM. - TUMOR INVADES THROUGH SEROSA. - RESECTION MARGINS ARE NEGATIVE. - THIRTY-TWO OF THIRTY-TWO LYMPH NODES NEGATIVE FOR CARCINOMA (0/32). - SEE ONCOLOGY TABLE. 5. Colon, resection margin (donut), proximal - BENIGN COLONIC MUCOSA. - NO DYSPLASIA OR MALIGNANCY. 6. Colon, resection margin (donut), distal - BENIGN COLONIC MUCOSA. - NO DYSPLASIA OR MALIGNANCY. 7. Spleen, possible metastasis - SPLEEN WITH PLASMA CELL INFILTRATE AND FIBROSIS, SEE COMMENT. - NO METASTATIC CARCINOMA IDENTIFIED.  Pathologic Staging: ypT4a, ypN0, ypM1b   MOLECULAR FEATURES ([facility]):   RADIOGRAPHIC STUDIES: Brain MRI w wo contrast 09/27/2015 IMPRESSION: 1.7 cm left frontoparietal mass with moderate vasogenic edema, most consistent with solitary metastasis. No midline shift.   ASSESSMENT: KELENA GARROW 65 y.o. female with a history of stage IV colon adenocarcinoma, KRAS mutation(+), on capecitabine maintenance therapy..   1. 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 Now her primary rectosigmoid colon cancer has been removed, she still has multiple small liver metastasis (biopsy confirmed in December 2014), and a small peritoneal disease (positive vaginal cuff biopsy) -She is clinically doing very  well, asymptomatic. Again, she is reluctant to restart chemotherapy, due to the concern of side effects from chemotherapy. We previously discussed that early chemo is likely prolong her life more than wait and see, but she really appreciate her current quality of life more than quantity of life. After lengthy discussion again, we decided to continue observation for now -She previously tolerated 5/fu or capecitabine poorly. Single agent irinotecan with avastin (she never had before) would be next line therapy if she needs it in future. -Due to the K-ras mutation, she is not candidate for EGFR inhibitor therapy -Her tumor has stable MSI, unlikely will response to immunotherapy -We discussed the option of clinical trial, she is not interested. -She recently developed a solitary brain metastasis, received stereotactic radiation. -She does not want systemic therapy, she otherwise feels well, no other complaints except occasional left hip pelvic pain. If her pain gets worse, I'll obtain a restaging CT scan, to see if she is a candidate for palliative radiation. -I'll see her back in 2 months for follow-up.  2. PE diagnosed on  11/13/2014 -Continue Xarelto. She is tolerating well, will continue, no clinical signs of bleeding.  3. Anemia, secondary to chemo, and surgery  -improved, hemoglobin 10.6 today   -continue ferrous sulfate -Continue monitoring, will repeat ferritin and iron study every 3 month, to see if she needs IV feraheme   4. Chronic Hypercalcemia   -She tolerated the Zometa last time. Calcium level came down some, stable, 11.3 today -We'll continue watch. Consider Zometa if her calcium>12    PLAN -RTC in 4 weeks for port flush and I'll see her back in 2 months with lab    All questions were answered. The patient knows to call the clinic with any problems, questions or concerns. We can certainly see the patient much sooner if necessary.  I spent 20 minutes counseling the patient face to  face. The total time spent in the appointment was 25 minutes.  Truitt Merle  10/20/2015

## 2015-10-20 NOTE — Patient Instructions (Signed)

## 2015-10-20 NOTE — Progress Notes (Addendum)
Ms. Kamaile Bissey ambulating with walker today.and denies any dizziness when changing from sitting to standing,nor when ambulating with her walker    No headaches nor blurred vision. Denies any changes in fine motor movement.  Note elevated, but steady pulse.  BP 130/62 mmHg  Pulse 126  Temp(Src) 98.4 F (36.9 C)  Ht 5' 5.5" (1.664 m)  Wt 148 lb 6.4 oz (67.314 kg)  BMI 24.31 kg/m2   BP 128/59 mmHg  Pulse 138  Temp(Src) 98.4 F (36.9 C)  Ht 5' 5.5" (1.664 m)  Wt 148 lb 6.4 oz (67.314 kg)  BMI 24.31 kg/m2

## 2015-10-20 NOTE — Telephone Encounter (Signed)
per of to sch pt appt-gave pt copy of av

## 2015-10-21 LAB — CEA (PARALLEL TESTING): CEA: 227.6 ng/mL — AB

## 2015-10-21 LAB — CEA: CEA1: 468.6 ng/mL — AB (ref 0.0–4.7)

## 2015-10-24 ENCOUNTER — Telehealth: Payer: Self-pay | Admitting: *Deleted

## 2015-10-24 ENCOUNTER — Encounter: Payer: Self-pay | Admitting: *Deleted

## 2015-10-24 ENCOUNTER — Other Ambulatory Visit: Payer: Self-pay | Admitting: *Deleted

## 2015-10-24 DIAGNOSIS — C19 Malignant neoplasm of rectosigmoid junction: Secondary | ICD-10-CM

## 2015-10-24 DIAGNOSIS — C7931 Secondary malignant neoplasm of brain: Secondary | ICD-10-CM

## 2015-10-24 NOTE — Progress Notes (Signed)
Called [facility] [phone]

## 2015-10-24 NOTE — Telephone Encounter (Signed)
Received call from [facility] stating that pt c/o of return of symptoms of heaviness in R hand & R leg & some ataxia/clumsiness with hand.  She is not on any decadron & [facility] RN states that she wasn't on any at 4/5 visit.  Discussed with Dr Burr Medico & she requested that Dr Lisbeth Renshaw be notified.  Called & spoke with his RN & asked that she notify Tiffany of any new orders. Pt also reports having bad abd cramps.  Reported also to Dr Burr Medico.  Nurse also states pt needs a script for a w/c this will be faxed to [phone]

## 2015-10-24 NOTE — Progress Notes (Signed)
Received a call from [facility] Dr Ernestina Penna office, stating she spoke with Tiffany from [place].  I called Tiffany, she reports patient is having heaviness to right hand, leg and ataxia, which started on Saturday (today is Friday) she reports it is increasing.  She reports she is not taking Decadron and hasn't since March 30th, without taper.  Spoke with Dr. Lisbeth Renshaw who is refilling Decadron 4 mg bid.  He would like Sheppard Plumber to call in a weeks time April 27th to check patients symptoms and discuss taper.

## 2015-10-24 NOTE — Progress Notes (Signed)
Received a call from [facility] Dr Ernestina Penna office, stating she spoke with Tiffany from [place].  I called Tiffany, she reports patient is having heaviness to right hand, leg and ataxia, which started on Saturday (today is Friday) she reports it is increasing.  She reports she is not taking Decadron and hasn't since March 30th, without taper.  Spoke with Dr. Lisbeth Renshaw who is refilling Decadron 4 mg bid.  He would like Sheppard Plumber to call in a weeks time April 27th to check patients symptoms and discuss taper.

## 2015-10-24 NOTE — Progress Notes (Signed)
Called [facility] [phone] per Dr. Ida Rogue request.  Called in Decadron 4 mg twice a day PO dispense 40 tablets.  Notified patients care giver.

## 2015-10-25 ENCOUNTER — Encounter (HOSPITAL_COMMUNITY): Payer: Self-pay

## 2015-10-25 ENCOUNTER — Emergency Department (HOSPITAL_COMMUNITY): Payer: Medicaid Other

## 2015-10-25 ENCOUNTER — Inpatient Hospital Stay (HOSPITAL_COMMUNITY)
Admission: EM | Admit: 2015-10-25 | Discharge: 2015-10-28 | DRG: 054 | Disposition: A | Discharge status: Home Health | Payer: Medicaid Other | Attending: Internal Medicine | Admitting: Internal Medicine

## 2015-10-25 DIAGNOSIS — Z79899 Other long term (current) drug therapy: Secondary | ICD-10-CM

## 2015-10-25 DIAGNOSIS — L8990 Pressure ulcer of unspecified site, unspecified stage: Secondary | ICD-10-CM | POA: Diagnosis present

## 2015-10-25 DIAGNOSIS — R531 Weakness: Secondary | ICD-10-CM | POA: Diagnosis present

## 2015-10-25 DIAGNOSIS — T85528A Displacement of other gastrointestinal prosthetic devices, implants and grafts, initial encounter: Secondary | ICD-10-CM | POA: Diagnosis present

## 2015-10-25 DIAGNOSIS — C7931 Secondary malignant neoplasm of brain: Principal | ICD-10-CM | POA: Diagnosis present

## 2015-10-25 DIAGNOSIS — M6289 Other specified disorders of muscle: Secondary | ICD-10-CM | POA: Diagnosis not present

## 2015-10-25 DIAGNOSIS — E440 Moderate protein-calorie malnutrition: Secondary | ICD-10-CM | POA: Diagnosis present

## 2015-10-25 DIAGNOSIS — C189 Malignant neoplasm of colon, unspecified: Secondary | ICD-10-CM | POA: Diagnosis present

## 2015-10-25 DIAGNOSIS — C19 Malignant neoplasm of rectosigmoid junction: Secondary | ICD-10-CM | POA: Diagnosis present

## 2015-10-25 DIAGNOSIS — Z7901 Long term (current) use of anticoagulants: Secondary | ICD-10-CM

## 2015-10-25 DIAGNOSIS — Z885 Allergy status to narcotic agent status: Secondary | ICD-10-CM

## 2015-10-25 DIAGNOSIS — Z7952 Long term (current) use of systemic steroids: Secondary | ICD-10-CM

## 2015-10-25 DIAGNOSIS — G936 Cerebral edema: Secondary | ICD-10-CM | POA: Diagnosis present

## 2015-10-25 DIAGNOSIS — Z6824 Body mass index (BMI) 24.0-24.9, adult: Secondary | ICD-10-CM

## 2015-10-25 DIAGNOSIS — K219 Gastro-esophageal reflux disease without esophagitis: Secondary | ICD-10-CM | POA: Diagnosis present

## 2015-10-25 DIAGNOSIS — Z86711 Personal history of pulmonary embolism: Secondary | ICD-10-CM

## 2015-10-25 DIAGNOSIS — D638 Anemia in other chronic diseases classified elsewhere: Secondary | ICD-10-CM | POA: Diagnosis present

## 2015-10-25 DIAGNOSIS — C787 Secondary malignant neoplasm of liver and intrahepatic bile duct: Secondary | ICD-10-CM | POA: Diagnosis present

## 2015-10-25 DIAGNOSIS — I2699 Other pulmonary embolism without acute cor pulmonale: Secondary | ICD-10-CM | POA: Diagnosis present

## 2015-10-25 LAB — COMPREHENSIVE METABOLIC PANEL
ALBUMIN: 3.2 g/dL — AB (ref 3.5–5.0)
ALK PHOS: 63 U/L (ref 38–126)
ALT: 13 U/L — AB (ref 14–54)
ANION GAP: 11 (ref 5–15)
AST: 19 U/L (ref 15–41)
BILIRUBIN TOTAL: 0.4 mg/dL (ref 0.3–1.2)
BUN: 10 mg/dL (ref 6–20)
CALCIUM: 12.1 mg/dL — AB (ref 8.9–10.3)
CO2: 20 mmol/L — AB (ref 22–32)
CREATININE: 0.53 mg/dL (ref 0.44–1.00)
Chloride: 107 mmol/L (ref 101–111)
GFR calc Af Amer: >60 mL/min (ref >60)
GFR calc non Af Amer: >60 mL/min (ref >60)
GLUCOSE: 130 mg/dL — AB (ref 65–99)
Potassium: 3.9 mmol/L (ref 3.5–5.1)
Sodium: 138 mmol/L (ref 135–145)
TOTAL PROTEIN: 6.8 g/dL (ref 6.5–8.1)

## 2015-10-25 LAB — PROTIME-INR
INR: 1.14 (ref 0.00–1.49)
PROTHROMBIN TIME: 14.8 s (ref 11.6–15.2)

## 2015-10-25 LAB — URINALYSIS, ROUTINE W REFLEX MICROSCOPIC
BILIRUBIN URINE: NEGATIVE
Glucose, UA: NEGATIVE mg/dL
Hgb urine dipstick: NEGATIVE
Ketones, ur: NEGATIVE mg/dL
NITRITE: NEGATIVE
PH: 6.5 (ref 5.0–8.0)
Protein, ur: NEGATIVE mg/dL
SPECIFIC GRAVITY, URINE: 1.005 (ref 1.005–1.030)

## 2015-10-25 LAB — URINE MICROSCOPIC-ADD ON: Bacteria, UA: NONE SEEN

## 2015-10-25 LAB — CBC
HEMATOCRIT: 29.9 % — AB (ref 36.0–46.0)
HEMOGLOBIN: 9.7 g/dL — AB (ref 12.0–15.0)
MCH: 27.8 pg (ref 26.0–34.0)
MCHC: 32.4 g/dL (ref 30.0–36.0)
MCV: 85.7 fL (ref 78.0–100.0)
Platelets: 623 10*3/uL — ABNORMAL HIGH (ref 150–400)
RBC: 3.49 MIL/uL — AB (ref 3.87–5.11)
RDW: 17.1 % — ABNORMAL HIGH (ref 11.5–15.5)
WBC: 4.5 10*3/uL (ref 4.0–10.5)

## 2015-10-25 LAB — I-STAT TROPONIN, ED: Troponin i, poc: 0 ng/mL (ref 0.00–0.08)

## 2015-10-25 MED ORDER — HYDROCODONE-ACETAMINOPHEN 5-325 MG PO TABS
2.0000 | ORAL_TABLET | Freq: Once | ORAL | Status: AC
  Administered 2015-10-25: 2 via ORAL
  Filled 2015-10-25: qty 2

## 2015-10-25 MED ORDER — POLYETHYLENE GLYCOL 3350 17 GM/SCOOP PO POWD: 1.0000 | ORAL | Status: DC | PRN

## 2015-10-25 MED ORDER — CYCLOBENZAPRINE HCL 5 MG PO TABS: 5.0000 mg | ORAL_TABLET | Freq: Three times a day (TID) | ORAL | Status: DC | PRN

## 2015-10-25 MED ORDER — DEXAMETHASONE SODIUM PHOSPHATE 4 MG/ML IJ SOLN
4.0000 mg | Freq: Four times a day (QID) | INTRAMUSCULAR | Status: DC
  Administered 2015-10-25 – 2015-10-28 (×11): 4 mg via INTRAVENOUS
  Filled 2015-10-25 (×12): qty 1

## 2015-10-25 MED ORDER — RIVAROXABAN 20 MG PO TABS
20.0000 mg | ORAL_TABLET | Freq: Every day | ORAL | Status: DC
Start: 2015-10-25 — End: 2015-10-28
  Administered 2015-10-25 – 2015-10-27 (×3): 20 mg via ORAL
  Filled 2015-10-25 (×3): qty 1

## 2015-10-25 MED ORDER — POLYETHYLENE GLYCOL 3350 17 G PO PACK: 17.0000 g | PACK | Freq: Every day | ORAL | Status: DC | PRN

## 2015-10-25 MED ORDER — ACETAMINOPHEN 500 MG PO TABS
500.0000 mg | ORAL_TABLET | Freq: Four times a day (QID) | ORAL | Status: DC | PRN
Start: 2015-10-25 — End: 2015-10-28
  Administered 2015-10-26: 500 mg via ORAL
  Filled 2015-10-25: qty 1

## 2015-10-25 MED ORDER — SODIUM CHLORIDE 0.9 % IV BOLUS (SEPSIS)
500.0000 mL | Freq: Once | INTRAVENOUS | Status: AC
  Administered 2015-10-25: 500 mL via INTRAVENOUS

## 2015-10-25 MED ORDER — PANTOPRAZOLE SODIUM 40 MG PO TBEC: 40.0000 mg | DELAYED_RELEASE_TABLET | Freq: Every day | ORAL | Status: DC

## 2015-10-25 MED ORDER — DEXAMETHASONE SODIUM PHOSPHATE 10 MG/ML IJ SOLN
20.0000 mg | Freq: Once | INTRAMUSCULAR | Status: AC
  Administered 2015-10-25: 20 mg via INTRAVENOUS
  Filled 2015-10-25: qty 2

## 2015-10-25 MED ORDER — FERROUS SULFATE 325 (65 FE) MG PO TABS
325.0000 mg | ORAL_TABLET | Freq: Every day | ORAL | Status: DC
  Administered 2015-10-26 – 2015-10-28 (×3): 325 mg via ORAL
  Filled 2015-10-25 (×3): qty 1

## 2015-10-25 MED ORDER — DOCUSATE SODIUM 100 MG PO CAPS: 100.0000 mg | ORAL_CAPSULE | Freq: Two times a day (BID) | ORAL | Status: DC | PRN

## 2015-10-25 MED ORDER — ONDANSETRON HCL 4 MG/2ML IJ SOLN: 4.0000 mg | Freq: Four times a day (QID) | INTRAMUSCULAR | Status: DC | PRN

## 2015-10-25 MED ORDER — PANTOPRAZOLE SODIUM 40 MG PO TBEC
40.0000 mg | DELAYED_RELEASE_TABLET | Freq: Every day | ORAL | Status: DC
  Administered 2015-10-26 – 2015-10-28 (×3): 40 mg via ORAL
  Filled 2015-10-25 (×3): qty 1

## 2015-10-25 MED ORDER — VITAMIN B-12 100 MCG PO TABS
100.0000 ug | ORAL_TABLET | Freq: Every morning | ORAL | Status: DC
  Administered 2015-10-26 – 2015-10-28 (×3): 100 ug via ORAL
  Filled 2015-10-25 (×3): qty 1

## 2015-10-25 MED ORDER — SODIUM CHLORIDE 0.9 % IV SOLN
INTRAVENOUS | Status: DC
  Administered 2015-10-25 – 2015-10-27 (×2): via INTRAVENOUS

## 2015-10-25 MED ORDER — ENSURE ENLIVE PO LIQD
237.0000 mL | Freq: Two times a day (BID) | ORAL | Status: DC
  Administered 2015-10-26 – 2015-10-28 (×5): 237 mL via ORAL

## 2015-10-25 MED ORDER — ONDANSETRON HCL 4 MG PO TABS: 4.0000 mg | ORAL_TABLET | Freq: Four times a day (QID) | ORAL | Status: DC | PRN

## 2015-10-25 NOTE — ED Provider Notes (Signed)
CSN: [id number]     Arrival date & time 10/25/15  1453 History   First MD Initiated Contact with Patient 10/25/15 1504     Chief Complaint  Patient presents with  . Weakness     (Consider location/radiation/quality/duration/timing/severity/associated sxs/prior Treatment) HPI Comments: 65 year old female with history of colon cancer, recent diagnosis of brain tumor status post 1 session of radiation treatment presents for increasing right-sided weakness. The patient reports she has had right-sided weakness since before her diagnosis of her tumor. She states that progressively over the last week or so she has had increased weakness and is making it more difficult to ambulate with her walker. She denies fevers, chills, cough, congestion, changes in urinary or bladder patterns. She denies chest pain or shortness of breath. She denies any headache. She said that she has been eating and drinking well at home.  Patient is a 65 y.o. female presenting with weakness.  Weakness Pertinent negatives include no chest pain, no abdominal pain, no headaches and no shortness of breath.    Past Medical History  Diagnosis Date  . Colon cancer ([place])     dx'd 2014  . Heart murmur     ? as a child   . Pulmonary embolism ([place]) 11/2014   . Anemia     hx of    Past Surgical History  Procedure Laterality Date  . Abdominal hysterectomy  2005  . Cesarean section  1976  . Flexible sigmoidoscopy N/A 05/25/2013    Procedure: FLEXIBLE SIGMOIDOSCOPY;  Surgeon: [name], MD;  Location: [facility];  Service: Endoscopy;  Laterality: N/A;  needs floro  . Colonic stent placement N/A 05/25/2013    Procedure: COLONIC STENT PLACEMENT;  Surgeon: [name], MD;  Location: [facility];  Service: Endoscopy;  Laterality: N/A;  . Portacath placement Right 06/25/2013    Procedure: ULTRA SOUND GUIDED INSERTION PORT-A-CATH;  Surgeon: [name], MD;  Location: [facility];  Service: General;   Laterality: Right;  . Flexible sigmoidoscopy N/A 08/19/2014    Procedure: FLEXIBLE SIGMOIDOSCOPY;  Surgeon: Gatha Mayer, MD;  Location: [facility];  Service: Endoscopy;  Laterality: N/A;  . Colonic stent placement N/A 08/19/2014    Procedure: COLONIC STENT PLACEMENT;  Surgeon: Gatha Mayer, MD;  Location: [facility];  Service: Endoscopy;  Laterality: N/A;  . Flexible sigmoidoscopy N/A 08/19/2014    Procedure: FLEXIBLE SIGMOIDOSCOPY;  Surgeon: Gatha Mayer, MD;  Location: [facility];  Service: Endoscopy;  Laterality: N/A;  . Colonic stent placement N/A 08/19/2014    Procedure: COLONIC STENT PLACEMENT;  Surgeon: Gatha Mayer, MD;  Location: [facility];  Service: Endoscopy;  Laterality: N/A;  . Colon resection N/A 10/28/2014    Procedure: Transverse loop colostomy;  Surgeon: [name], MD;  Location: [facility];  Service: General;  Laterality: N/A;  . Colostomy revision N/A 01/03/2015    Procedure: REVISION OF TRANSVERSE LOOP COLOSTOMY WITH PARTIAL  COLECTOMY;  Surgeon: [name], MD;  Location: [facility];  Service: General;  Laterality: N/A;  . Laparoscopic low anterior resection  03/06/2015    03/06/2015  . Laparoscopic splenectomy  03/06/2015  . Colon resection N/A 03/06/2015    Procedure: LAPAROSCOPIC takedown loop colostomy ;  Surgeon: Michael Boston, MD;  Location: [facility];  Service: General;  Laterality: N/A;  . 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 Bowel resection  03/06/2015  Procedure: LOW ANTERIOR BOWEL RESECTION, rigid proctoscopy;  Surgeon: Michael Boston, MD;  Location: [facility];  Service: General;;  . Polypectomy  03/06/2015    Procedure: transverse POLYPECTOMY x 4;  Surgeon: Michael Boston, MD;  Location: [facility];  Service: General;;   Family History  Problem Relation Age of Onset  . Colon cancer Sister 34  . Diabetes Neg Hx   . Thyroid disease Neg Hx   . Breast cancer  Maternal Aunt   . Prostate cancer Maternal Uncle   . Bone cancer Maternal Grandfather   . Prostate cancer Maternal Uncle    Social History  Substance Use Topics  . Smoking status: Former Smoker -- 1 years    Types: Cigarettes    Quit date: 05/24/1971  . Smokeless tobacco: Never Used  . Alcohol Use: No   OB History    No data available     Review of Systems  Constitutional: Negative for fever, chills and fatigue.  HENT: Negative for congestion, postnasal drip, rhinorrhea and sore throat.   Eyes: Negative for pain and visual disturbance.  Respiratory: Negative for cough, chest tightness, shortness of breath and wheezing.   Cardiovascular: Negative for chest pain, palpitations and leg swelling.  Gastrointestinal: Negative for nausea, vomiting, abdominal pain and diarrhea.  Genitourinary: Negative for dysuria, urgency and hematuria.  Musculoskeletal: Negative for myalgias, back pain, neck pain and neck stiffness.  Skin: Negative for rash and wound.  Neurological: Positive for weakness (right sided). Negative for dizziness, seizures, facial asymmetry, speech difficulty, light-headedness, numbness and headaches.      Allergies  Oxycontin and Codeine  Home Medications   Prior to Admission medications   Medication Sig Start Date End Date Taking? Authorizing Provider  acetaminophen (TYLENOL) 500 MG tablet Take 500 mg by mouth every 6 (six) hours as needed for moderate pain or headache.    Yes Historical Provider, MD  Cyanocobalamin (VITAMIN B 12 PO) Take 1 tablet by mouth every morning.   Yes Historical Provider, MD  dexamethasone (DECADRON) 2 MG tablet Take 4 mg by mouth 2 (two) times daily. 10/02/15  Yes Historical Provider, MD  IRON PO Take 1 tablet by mouth every morning.   Yes Historical Provider, MD  pantoprazole (PROTONIX) 40 MG tablet Take 1 tablet by mouth daily. 10/01/15  Yes Historical Provider, MD  polyethylene glycol powder (GLYCOLAX/MIRALAX) powder TAKE 34 G (2 DOSES)  BY MOUTH DAILY AS NEEDED FOR MILD CONSTIPATION OR MODERATE CONSTIPATION 03/03/14  Yes Concha Norway, MD  rivaroxaban (XARELTO) 20 MG TABS tablet Take 1 tablet (20 mg total) by mouth daily with supper. 10/01/15  Yes Daniel J Angiulli, PA-C   BP 118/56 mmHg  Pulse 94  Temp(Src) 98.3 F (36.8 C) (Oral)  Resp 14  Ht 5' 5.5" (1.664 m)  Wt 150 lb 9.6 oz (68.312 kg)  BMI 24.67 kg/m2  SpO2 100% Physical Exam  Constitutional: She is oriented to person, place, and time. She appears well-developed and well-nourished. No distress.  HENT:  Head: Normocephalic and atraumatic.  Right Ear: External ear normal.  Left Ear: External ear normal.  Nose: Nose normal.  Mouth/Throat: Oropharynx is clear and moist. No oropharyngeal exudate.  Eyes: EOM are normal. Pupils are equal, round, and reactive to light.  Neck: Normal range of motion. Neck supple.  Cardiovascular: Normal rate, regular rhythm, normal heart sounds and intact distal pulses.   No murmur heard. Pulmonary/Chest: Effort normal. No respiratory distress. She has no wheezes. She has no rales.  Abdominal: Soft. She  exhibits no distension and no mass. There is no tenderness.  Musculoskeletal: Normal range of motion. She exhibits no edema or tenderness.  Neurological: She is alert and oriented to person, place, and time. She is not disoriented. No cranial nerve deficit.  Mildly decreased strength in the right hand.  Right leg with significantly more weakness and inability to lift/hold leg off of the bed.    Skin: Skin is warm and dry. No rash noted. She is not diaphoretic.  Vitals reviewed.   ED Course  Procedures (including critical care time) Labs Review Labs Reviewed  CBC - Abnormal; Notable for the following:    RBC 3.49 (*)    Hemoglobin 9.7 (*)    HCT 29.9 (*)    RDW 17.1 (*)    Platelets 623 (*)    All other components within normal limits  URINALYSIS, ROUTINE W REFLEX MICROSCOPIC (NOT AT [facility]) - Abnormal; Notable for the following:     Leukocytes, UA TRACE (*)    All other components within normal limits  COMPREHENSIVE METABOLIC PANEL - Abnormal; Notable for the following:    CO2 20 (*)    Glucose, Bld 130 (*)    Calcium 12.1 (*)    Albumin 3.2 (*)    ALT 13 (*)    All other components within normal limits  URINE MICROSCOPIC-ADD ON - Abnormal; Notable for the following:    Squamous Epithelial / LPF 0-5 (*)    All other components within normal limits  PROTIME-INR  BASIC METABOLIC PANEL  CBC  I-STAT TROPOININ, ED    Imaging Review Dg Chest 2 View  10/25/2015  CLINICAL DATA:  Right-sided weakness beginning several days ago. Metastatic colon carcinoma. EXAM: CHEST  2 VIEW COMPARISON:  09/25/2015 FINDINGS: Right-sided Port-A-Cath remains in appropriate position. Heart size is normal. Both lungs are clear. No evidence of pneumothorax or pleural effusion. IMPRESSION: No active cardiopulmonary disease. Electronically Signed   By: Earle Gell M.D.   On: 10/25/2015 17:26   Ct Head Wo Contrast  10/25/2015  CLINICAL DATA:  Right-sided weakness, history of left parietal metastatic lesion EXAM: CT HEAD WITHOUT CONTRAST TECHNIQUE: Contiguous axial images were obtained from the base of the skull through the vertex without intravenous contrast. COMPARISON:  09/15/2015 FINDINGS: The bony calvarium is intact. The known metastatic lesion is again noted at the vertex in the left parietal lobe similar to that seen on the prior exam. The degree of surrounding vasogenic edema has increased from the prior exam now measuring roughly 7.4 by 4.8 cm in greatest AP and transverse dimensions respectively. No definitive midline shift is seen. No findings to suggest acute hemorrhage or acute infarct are noted. IMPRESSION: Findings consistent with the known metastatic lesion in the left parietal lobe near the vertex. The degree of surrounding vasogenic edema has increased in size when compare with the prior exam. This would correspond with the patient's  given clinical history of not taking prescribed steroids. Electronically Signed   By: Inez Catalina M.D.   On: 10/25/2015 17:37   I have personally reviewed and evaluated these images and lab results as part of my medical decision-making.   EKG Interpretation   Date/Time:  Saturday October 25 2015 15:01:38 EDT Ventricular Rate:  107 PR Interval:  194 QRS Duration: 73 QT Interval:  300 QTC Calculation: 400 R Axis:   65 Text Interpretation:  Sinus tachycardia Abnormal T, consider ischemia,  diffuse leads Borderline ST elevation, lateral leads New T wave inversions  with deep  t waves in III, aVF, V3, V4 Confirmed by Lamaria Hildebrandt ([id number])  on 10/25/2015 3:09:26 PM Also confirmed by Alfonse Spruce, Lonnell Chaput ([id number]), editor  [place], [place], [place] [id number])  on 10/25/2015 4:48:15 PM      MDM  Patient was seen and evaluated in stable condition. Patient mildly tachycardic. Right-sided weakness noted on examination consistent with history. CTA had increased edema around her known tumor. Otherwise results relatively unremarkable without other explanation for her increased weakness. This increased edema is likely secondary to the patient's discontinuation of her outpatient steroids. Discussed case with Dr. Marin Olp on call for Dr. Burr Medico from oncology.  He recommended patient be given 20 mg of Decadron now and then 8 mg every 6 hours. Case was discussed with Dr. Blaine Hamper who agreed with admission. Because of the patient's oncology history Dr. Blaine Hamper recommended the patient be admitted over at [facility]. I agreed with this plan. Final diagnoses:  Weakness    1. Right sided weakness  2. Brain tumor    Harvel Quale, MD 10/26/15 [phone]

## 2015-10-25 NOTE — ED Notes (Signed)
Pt reports increased right sided weakness beginning Saturday after overexertion.  Pt was d/c on 3/31 for brain tumor that was treated with radiation.  Pt presented with right sided weakness prior to admission.  Pt reports her right side has gotten increasingly weaker since Saturday.  Pt also abruptly stopped her steroids 2 weeks ago and just started back yesterday.

## 2015-10-25 NOTE — H&P (Signed)
History and Physical    [name] [name] [id number] DOB: April 15, 1951 DOA: 10/25/2015  Referring MD/NP/PA:   PCP: Kristine Garbe, MD   Outpatient Specialists: oncology, Dr .Burr Medico  Patient coming from:  Home  Chief Complaint: Right-sided weakness  HPI: [name] [name] is a 65 y.o. 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  Pt has hx of colon cancer which metastasized to the brain. She is status post 1 session of radiation treatment. She is supposed to take Decadron, but has not taken it for 2 weeks. She states that she has increasing right-sided weakness. The patient reports she has had right-sided weakness since before her diagnosis of her tumor, but it has been worsening over the last week. She feels more difficult to ambulate with her walker. She does not have urinary incontinence or loss of control for bowel movement. No vision change or hearing loss. Patient does not have chest pain, shortness of breath, cough, fever, chills, nausea, vomiting, abdominal pain or diarrhea. No symptoms of UTI.  ED Course: pt was found to have WBC 4.5, temperature normal, present a tachycardia, transient tachypnea, electrolytes renal function okay, negative chest x-ray for acute abnormalities, urinalysis with trace amount of leukocytes, calcium 12.1. CT head showed worsening edema. Patient is admitted to inpatient for further depression treatment.  Can patient participate in ADLs? Some   Review of Systems:   General: no fevers, chills, no changes in body weight,  has fatigue HEENT: no blurry vision, hearing changes or sore throat Pulm: no dyspnea, coughing, wheezing CV: no chest pain, no palpitations Abd: no nausea, vomiting, abdominal pain, diarrhea, constipation GU: no dysuria, burning on urination, increased urinary frequency, hematuria    Ext: no leg edema Neuro: has right sided weakness. no vision change or hearing loss Skin: no rash MSK: No muscle spasm, no deformity, no limitation of range of movement in spin Heme: No easy bruising.  Travel history: No recent long distant travel.  Allergy:  Allergies  Allergen Reactions  . Oxycontin [Oxycodone Hcl] Anaphylaxis, Hives and Itching  . Codeine Itching and Nausea And Vomiting    Past Medical History  Diagnosis Date  . Colon cancer ([place])     dx'd 2014  . Heart murmur     ? as a child   . Pulmonary embolism ([place]) 11/2014   . Anemia     hx of     Past Surgical History  Procedure Laterality Date  . Abdominal hysterectomy  2005  . Cesarean section  1976  . Flexible sigmoidoscopy N/A 05/25/2013    Procedure: FLEXIBLE SIGMOIDOSCOPY;  Surgeon: [name], MD;  Location: [facility];  Service: Endoscopy;  Laterality: N/A;  needs floro  . Colonic stent placement N/A 05/25/2013    Procedure: COLONIC STENT PLACEMENT;  Surgeon: [name], MD;  Location: [facility];  Service: Endoscopy;  Laterality: N/A;  . Portacath placement Right 06/25/2013    Procedure: ULTRA SOUND GUIDED INSERTION PORT-A-CATH;  Surgeon: [name], MD;  Location: [facility];  Service: General;  Laterality: Right;  . Flexible sigmoidoscopy N/A 08/19/2014    Procedure: FLEXIBLE SIGMOIDOSCOPY;  Surgeon: Gatha Mayer, MD;  Location: [facility];  Service: Endoscopy;  Laterality: N/A;  . Colonic stent placement N/A 08/19/2014    Procedure: COLONIC STENT PLACEMENT;  Surgeon: Gatha Mayer, MD;  [facility]: [facility];  Service: Endoscopy;  Laterality: N/A;  . Flexible sigmoidoscopy N/A 08/19/2014    Procedure: FLEXIBLE SIGMOIDOSCOPY;  Surgeon: Gatha Mayer, MD;  [facility]: [facility];  Service: Endoscopy;  Laterality: N/A;  . Colonic stent placement N/A 08/19/2014    Procedure: COLONIC STENT PLACEMENT;  Surgeon: Gatha Mayer, MD;  Location: [facility];  Service:  Endoscopy;  Laterality: N/A;  . Colon resection N/A 10/28/2014    Procedure: Transverse loop colostomy;  Surgeon: [name], MD;  Location: [facility];  Service: General;  Laterality: N/A;  . Colostomy revision N/A 01/03/2015    Procedure: REVISION OF TRANSVERSE LOOP COLOSTOMY WITH PARTIAL  COLECTOMY;  Surgeon: [name], MD;  Location: [facility];  Service: General;  Laterality: N/A;  . Laparoscopic low anterior resection  03/06/2015    03/06/2015  . Laparoscopic splenectomy  03/06/2015  . Colon resection N/A 03/06/2015    Procedure: LAPAROSCOPIC takedown loop colostomy ;  Surgeon: Michael Boston, MD;  Location: [facility];  Service: General;  Laterality: N/A;  . 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 Bowel resection  03/06/2015    Procedure: LOW ANTERIOR BOWEL RESECTION, rigid proctoscopy;  Surgeon: Michael Boston, MD;  Location: [facility];  Service: General;;  . Polypectomy  03/06/2015    Procedure: transverse POLYPECTOMY x 4;  Surgeon: Michael Boston, MD;  Location: [facility];  Service: General;;    Social History:  reports that she quit smoking about 44 years ago. Her smoking use included Cigarettes. She quit after 1 year of use. She has never used smokeless tobacco. She reports that she does not drink alcohol or use illicit drugs.  Family History:  Family History  Problem Relation Age of Onset  . Colon cancer Sister 48  . Diabetes Neg Hx   . Thyroid disease Neg Hx   . Breast cancer Maternal Aunt   . Prostate cancer Maternal Uncle   . Bone cancer Maternal Grandfather   . Prostate cancer Maternal Uncle      Prior to Admission medications   Medication Sig Start Date End Date Taking? Authorizing Provider  acetaminophen (TYLENOL) 500 MG tablet Take 500 mg by mouth every 6 (six) hours as needed for moderate pain or headache.    Yes Historical Provider, MD  Cyanocobalamin (VITAMIN B 12 PO) Take 1 tablet  by mouth every morning.   Yes Historical Provider, MD  dexamethasone (DECADRON) 2 MG tablet Take 4 mg by mouth 2 (two) times daily. 10/02/15  Yes Historical Provider, MD  IRON PO Take 1 tablet by mouth every morning.   Yes Historical Provider, MD  pantoprazole (PROTONIX) 40 MG tablet Take 1 tablet by mouth daily. 10/01/15  Yes Historical Provider, MD  polyethylene glycol powder (GLYCOLAX/MIRALAX) powder TAKE 34 G (2 DOSES) BY MOUTH DAILY AS NEEDED FOR MILD CONSTIPATION OR MODERATE CONSTIPATION 03/03/14  Yes Concha Norway, MD  rivaroxaban (XARELTO) 20 MG TABS tablet Take 1 tablet (20 mg total) by mouth daily with supper. 10/01/15  Yes Lavon Paganini Angiulli, PA-C    Physical Exam: Filed Vitals:   10/25/15 2100 10/25/15 2129 10/25/15 2146 10/26/15 0532  BP: 111/71  118/56 119/71  Pulse: 92  94 88  Temp:  98.2 F (36.8 C) 98.3 F (36.8 C) 98 F (36.7 C)  TempSrc:  Oral Oral Oral  Resp: 23  14 16   Height:   5' 5.5" (1.664 m)   Weight:  68.312 kg (150 lb 9.6 oz)   SpO2: 100%  100% 100%   General: Not in acute distress HEENT:       Eyes: PERRL, EOMI, no scleral icterus.       ENT: No discharge from the ears and nose, no pharynx injection, no tonsillar enlargement.        Neck: No JVD, no bruit, no mass felt. Heme: No neck lymph node enlargement. Cardiac: S1/S2, RRR, No murmurs, No gallops or rubs. Pulm: No rales, wheezing, rhonchi or rubs. Abd: Soft, nondistended, nontender, no rebound pain, no organomegaly, BS present. GU: No hematuria Ext: No pitting leg edema bilaterally. 2+DP/PT pulse bilaterally. Musculoskeletal: No joint deformities, No joint redness or warmth, no limitation of ROM in spin. Skin: No rashes.  Neuro: Alert, oriented X3, cranial nerves II-XII grossly intact, moves all extremities normally. Muscle strength 4/5 in right leg, 5/5 in other extremities, sensation to light touch intact. Negative Babinski's sign. Normal finger to nose test. Psych: Patient is not psychotic, no  suicidal or hemocidal ideation.  Labs on Admission: I have personally reviewed following labs and imaging studies  CBC:  Recent Labs Lab 10/20/15 1024 10/25/15 1559 10/26/15 0522  WBC 3.4* 4.5 5.5  NEUTROABS 2.2  --   --   HGB 10.3* 9.7* 10.6*  HCT 31.3* 29.9* 32.7*  MCV 85.1 85.7 85.4  PLT 307 623* [id number]*   Basic Metabolic Panel:  Recent Labs Lab 10/20/15 1024 10/25/15 1558 10/26/15 0522  NA 137 138 140  K 4.3 3.9 4.4  CL  --  107 110  CO2 24 20* 21*  GLUCOSE 106 130* 154*  BUN 11.2 10 12   CREATININE 0.7 0.53 0.54  CALCIUM 12.3* 12.1* 11.0*   GFR: Estimated Creatinine Clearance: 66.1 mL/min (by C-G formula based on Cr of 0.54). Liver Function Tests:  Recent Labs Lab 10/20/15 1024 10/25/15 1558  AST 16 19  ALT 14 13*  ALKPHOS 79 63  BILITOT <0.30 0.4  PROT 7.1 6.8  ALBUMIN 3.3* 3.2*   No results for input(s): LIPASE, AMYLASE in the last 168 hours. No results for input(s): AMMONIA in the last 168 hours. Coagulation Profile:  Recent Labs Lab 10/25/15 2050  INR 1.14   Cardiac Enzymes: No results for input(s): CKTOTAL, CKMB, CKMBINDEX, TROPONINI in the last 168 hours. BNP (last 3 results) No results for input(s): PROBNP in the last 8760 hours. HbA1C: No results for input(s): HGBA1C in the last 72 hours. CBG: No results for input(s): GLUCAP in the last 168 hours. Lipid Profile: No results for input(s): CHOL, HDL, LDLCALC, TRIG, CHOLHDL, LDLDIRECT in the last 72 hours. Thyroid Function Tests: No results for input(s): TSH, T4TOTAL, FREET4, T3FREE, THYROIDAB in the last 72 hours. Anemia Panel: No results for input(s): VITAMINB12, FOLATE, FERRITIN, TIBC, IRON, RETICCTPCT in the last 72 hours. Urine analysis:    Component Value Date/Time   COLORURINE YELLOW 10/25/2015 [place] 10/25/2015 1604   LABSPEC 1.005 10/25/2015 1604   PHURINE 6.5 10/25/2015 1604   GLUCOSEU NEGATIVE 10/25/2015 1604   HGBUR NEGATIVE 10/25/2015 1604    BILIRUBINUR NEGATIVE 10/25/2015 1604   KETONESUR NEGATIVE 10/25/2015 1604   PROTEINUR NEGATIVE 10/25/2015 1604   UROBILINOGEN 0.2 11/13/2014 2008   NITRITE NEGATIVE 10/25/2015 1604   LEUKOCYTESUR TRACE* 10/25/2015 1604   Sepsis Labs: @LABRCNTIP (procalcitonin:4,lacticidven:4) )No results found for this or any previous visit (from the past 240 hour(s)).   Radiological Exams on Admission: Dg Chest 2 View  10/25/2015  CLINICAL DATA:  Right-sided weakness beginning several days ago. Metastatic colon carcinoma. EXAM: CHEST  2 VIEW COMPARISON:  09/25/2015 FINDINGS: Right-sided Port-A-Cath remains in appropriate position. Heart size is normal. Both lungs are clear. No evidence of pneumothorax or pleural effusion. IMPRESSION: No active cardiopulmonary disease. Electronically Signed   By: Earle Gell M.D.   On: 10/25/2015 17:26   Ct Head Wo Contrast  10/25/2015  CLINICAL DATA:  Right-sided weakness, history of left parietal metastatic lesion EXAM: CT HEAD WITHOUT CONTRAST TECHNIQUE: Contiguous axial images were obtained from the base of the skull through the vertex without intravenous contrast. COMPARISON:  09/15/2015 FINDINGS: The bony calvarium is intact. The known metastatic lesion is again noted at the vertex in the left parietal lobe similar to that seen on the prior exam. The degree of surrounding vasogenic edema has increased from the prior exam now measuring roughly 7.4 by 4.8 cm in greatest AP and transverse dimensions respectively. No definitive midline shift is seen. No findings to suggest acute hemorrhage or acute infarct are noted. IMPRESSION: Findings consistent with the known metastatic lesion in the left parietal lobe near the vertex. The degree of surrounding vasogenic edema has increased in size when compare with the prior exam. This would correspond with the patient's given clinical history of not taking prescribed steroids. Electronically Signed   By: Inez Catalina M.D.   On: 10/25/2015  17:37     EKG: Independently reviewed. QTC 400, T-wave inversion in inferior leads, V3-V6.  Assessment/Plan Principal Problem:   Right sided weakness Active Problems:   Hypercalcemia   Colorectal cancer, stage IV, obstructing s/p stent x2, ostomy, then LAR 03/06/2015   Migrated colon stent   Pulmonary embolism ([facility])   Long-term (current) use of anticoagulants   Brain metastases ([place])   Metastatic colon cancer to liver ([facility])   Anemia of chronic disease   Protein-calorie malnutrition, moderate ([facility])   Pressure ulcer   Right sided weakness: CT-head showed worsening edema. This is most likely due to stopping taking Decadron. EDP discussed with oncologist, Dr. Jonette Eva, who recommended to restart Decadron.  -will admit tele bed for observation -Frequent neuro check -Decadron 20 mg 1, followed by 4 mg every 6 hours -pt/ot  Hypercalcemia: Ca 12.1. Mental status is normal. This is due to metastasized cancer. -IV fluids: Normal saline 500 mL x 2, followed by 25 mL per hour  Colorectal cancer:  stage IV, metastasized to brain and liver. Obstructing s/p stent x2, ostomy, then LAR 03/06/2015. S/p of brain XRT. Followed up by dr. Burr Medico -f/u with Dr. Burr Medico  Hx of Pulmonary embolism ([place]) -on Xarelto  Protein-calorie malnutrition, moderate ([place]): -Ensure   DVT ppx: on Xarelto Code Status: Full code Family Communication: Yes, patient's daughter at bed side Disposition Plan:  Anticipate discharge back to previous home environment Consults called: EDP dicussed with oncology, dr. Jonette Eva Admission status:  obs / tele   Date of Service 10/26/2015    Ivor Costa [facility] Pager [phone]  If 7PM-7AM, please contact night-coverage [email or web address] Password [facility] 10/26/2015, 7:36 AM

## 2015-10-26 DIAGNOSIS — D638 Anemia in other chronic diseases classified elsewhere: Secondary | ICD-10-CM | POA: Diagnosis not present

## 2015-10-26 DIAGNOSIS — R531 Weakness: Secondary | ICD-10-CM | POA: Diagnosis present

## 2015-10-26 DIAGNOSIS — C787 Secondary malignant neoplasm of liver and intrahepatic bile duct: Secondary | ICD-10-CM | POA: Diagnosis present

## 2015-10-26 DIAGNOSIS — K219 Gastro-esophageal reflux disease without esophagitis: Secondary | ICD-10-CM | POA: Diagnosis present

## 2015-10-26 DIAGNOSIS — E440 Moderate protein-calorie malnutrition: Secondary | ICD-10-CM | POA: Diagnosis present

## 2015-10-26 DIAGNOSIS — Z7952 Long term (current) use of systemic steroids: Secondary | ICD-10-CM | POA: Diagnosis not present

## 2015-10-26 DIAGNOSIS — Z6824 Body mass index (BMI) 24.0-24.9, adult: Secondary | ICD-10-CM | POA: Diagnosis not present

## 2015-10-26 DIAGNOSIS — C7931 Secondary malignant neoplasm of brain: Secondary | ICD-10-CM | POA: Diagnosis not present

## 2015-10-26 DIAGNOSIS — M6289 Other specified disorders of muscle: Secondary | ICD-10-CM

## 2015-10-26 DIAGNOSIS — Z7901 Long term (current) use of anticoagulants: Secondary | ICD-10-CM | POA: Diagnosis not present

## 2015-10-26 DIAGNOSIS — Z79899 Other long term (current) drug therapy: Secondary | ICD-10-CM | POA: Diagnosis not present

## 2015-10-26 DIAGNOSIS — Z885 Allergy status to narcotic agent status: Secondary | ICD-10-CM | POA: Diagnosis not present

## 2015-10-26 DIAGNOSIS — C19 Malignant neoplasm of rectosigmoid junction: Secondary | ICD-10-CM | POA: Diagnosis not present

## 2015-10-26 DIAGNOSIS — L8990 Pressure ulcer of unspecified site, unspecified stage: Secondary | ICD-10-CM | POA: Diagnosis present

## 2015-10-26 DIAGNOSIS — G936 Cerebral edema: Secondary | ICD-10-CM | POA: Diagnosis present

## 2015-10-26 DIAGNOSIS — Z86711 Personal history of pulmonary embolism: Secondary | ICD-10-CM | POA: Diagnosis not present

## 2015-10-26 LAB — BASIC METABOLIC PANEL
Anion gap: 9 (ref 5–15)
BUN: 12 mg/dL (ref 6–20)
CO2: 21 mmol/L — ABNORMAL LOW (ref 22–32)
Calcium: 11 mg/dL — ABNORMAL HIGH (ref 8.9–10.3)
Chloride: 110 mmol/L (ref 101–111)
Creatinine, Ser: 0.54 mg/dL (ref 0.44–1.00)
GLUCOSE: 154 mg/dL — AB (ref 65–99)
Potassium: 4.4 mmol/L (ref 3.5–5.1)
SODIUM: 140 mmol/L (ref 135–145)

## 2015-10-26 LAB — CBC
HEMATOCRIT: 32.7 % — AB (ref 36.0–46.0)
HEMOGLOBIN: 10.6 g/dL — AB (ref 12.0–15.0)
MCH: 27.7 pg (ref 26.0–34.0)
MCHC: 32.4 g/dL (ref 30.0–36.0)
MCV: 85.4 fL (ref 78.0–100.0)
Platelets: 791 10*3/uL — ABNORMAL HIGH (ref 150–400)
RBC: 3.83 MIL/uL — ABNORMAL LOW (ref 3.87–5.11)
RDW: 17.5 % — ABNORMAL HIGH (ref 11.5–15.5)
WBC: 5.5 10*3/uL (ref 4.0–10.5)

## 2015-10-26 MED ORDER — HYDROCODONE-ACETAMINOPHEN 5-325 MG PO TABS
1.0000 | ORAL_TABLET | Freq: Four times a day (QID) | ORAL | Status: DC | PRN
  Administered 2015-10-26 – 2015-10-28 (×7): 2 via ORAL
  Filled 2015-10-26 (×7): qty 2

## 2015-10-26 NOTE — Progress Notes (Signed)
PROGRESS NOTE    [name] [name]  [id number] DOB: 05-Jan-1951 DOA: 10/25/2015 PCP: Kristine Garbe, MD  Outpatient Specialists: Dr.Feng Brief Narrative:[name] [name] is a 65 y.o. 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 Pt has hx of colon cancer which metastasized to the brain, s/p 1 session of radiation treatment. She is supposed to take Decadron, but has not taken it for 2 weeks. She states that she has increasing right-sided weakness.   Assessment & Plan:   Right sided weakness:  -CT-head showed worsening edema around known L brain metastasis. This is most likely due to stopping taking Decadron. -s/p SRS on 09/18/15 -resumed decadron and clinically improving -will ask Pt to eval -will d/w Dr.Feng regarding safety of anticoagulation in this scenario  Hypercalcemia: on malignancy -ca was 12, yesterday, now 11 -continue IVF, s/p Zometa last month -Cmet, i cal in am  Colorectal cancer: stage IV, metastasized to brain and liver. Obstructing s/p stent x2, ostomy, then LAR 03/06/2015.  -s/p SRS 09/18/15 -under observation, followed by Dr.Feng  Hx of Pulmonary embolism ([place]) -on Xarelto  Protein-calorie malnutrition, moderate ([place]): -Ensure  DVT ppx: on Xarelto  Code Status: Full code Family Communication: none at bed side Disposition Plan: Home tomorrow if stable  Consultants: d/w Dr.Ennever yetserday  Procedures:  Antimicrobials:  Subjective: Feels better, arm and leg improving Objective: Filed Vitals:   10/25/15 2100 10/25/15 2129 10/25/15 2146 10/26/15 0532  BP: 111/71  118/56 119/71  Pulse: 92  94 88  Temp:  98.2 F (36.8 C) 98.3 F (36.8 C) 98 F (36.7 C)  TempSrc:  Oral Oral Oral  Resp: 23  14 16   Height:   5' 5.5" (1.664 m)   Weight:   68.312 kg (150 lb 9.6 oz)   SpO2: 100%  100% 100%     Intake/Output Summary (Last 24 hours) at 10/26/15 1112 Last data filed at 10/26/15 1004  Gross per 24 hour  Intake 1392.5 ml  Output   2750 ml  Net -1357.5 ml   Filed Weights   10/25/15 2146  Weight: 68.312 kg (150 lb 9.6 oz)    Examination:  General exam: Appears calm and comfortable  Respiratory system: Clear to auscultation. Respiratory effort normal. Cardiovascular system: S1 & S2 heard, RRR. No JVD, murmurs, rubs, gallops or clicks. No pedal edema. Gastrointestinal system: Abdomen is nondistended, soft and nontender. No organomegaly or masses felt. Normal bowel sounds heard. Central nervous system: Alert and oriented. Mild R leg weakness and R hand weakness Extremities: Symmetric 5 x 5 power. Skin: No rashes, lesions or ulcers Psychiatry: Judgement and insight appear normal. Mood & affect appropriate.     Data Reviewed: I have personally reviewed following labs and imaging studies  CBC:  Recent Labs Lab 10/20/15 1024 10/25/15 1559 10/26/15 0522  WBC 3.4* 4.5 5.5  NEUTROABS 2.2  --   --   HGB 10.3* 9.7* 10.6*  HCT 31.3* 29.9* 32.7*  MCV 85.1 85.7 85.4  PLT 307 623* [id number]*   Basic Metabolic Panel:  Recent Labs Lab 10/20/15 1024 10/25/15 1558 10/26/15 0522  NA 137 138 140  K 4.3 3.9 4.4  CL  --  107 110  CO2 24 20* 21*  GLUCOSE 106 130* 154*  BUN 11.2 10 12   CREATININE 0.7 0.53 0.54  CALCIUM 12.3* 12.1* 11.0*   GFR: Estimated Creatinine Clearance: 66.1 mL/min (  by C-G formula based on Cr of 0.54). Liver Function Tests:  Recent Labs Lab 10/20/15 1024 10/25/15 1558  AST 16 19  ALT 14 13*  ALKPHOS 79 63  BILITOT <0.30 0.4  PROT 7.1 6.8  ALBUMIN 3.3* 3.2*   No results for input(s): LIPASE, AMYLASE in the last 168 hours. No results for input(s): AMMONIA in the last 168 hours. Coagulation Profile:  Recent Labs Lab 10/25/15 2050  INR 1.14   Cardiac Enzymes: No results for input(s): CKTOTAL, CKMB, CKMBINDEX, TROPONINI in the last 168  hours. BNP (last 3 results) No results for input(s): PROBNP in the last 8760 hours. HbA1C: No results for input(s): HGBA1C in the last 72 hours. CBG: No results for input(s): GLUCAP in the last 168 hours. Lipid Profile: No results for input(s): CHOL, HDL, LDLCALC, TRIG, CHOLHDL, LDLDIRECT in the last 72 hours. Thyroid Function Tests: No results for input(s): TSH, T4TOTAL, FREET4, T3FREE, THYROIDAB in the last 72 hours. Anemia Panel: No results for input(s): VITAMINB12, FOLATE, FERRITIN, TIBC, IRON, RETICCTPCT in the last 72 hours. Urine analysis:    Component Value Date/Time   COLORURINE YELLOW 10/25/2015 [place] 10/25/2015 1604   LABSPEC 1.005 10/25/2015 1604   PHURINE 6.5 10/25/2015 1604   GLUCOSEU NEGATIVE 10/25/2015 1604   HGBUR NEGATIVE 10/25/2015 1604   BILIRUBINUR NEGATIVE 10/25/2015 1604   KETONESUR NEGATIVE 10/25/2015 1604   PROTEINUR NEGATIVE 10/25/2015 1604   UROBILINOGEN 0.2 11/13/2014 2008   NITRITE NEGATIVE 10/25/2015 1604   LEUKOCYTESUR TRACE* 10/25/2015 1604   Sepsis Labs: @LABRCNTIP (procalcitonin:4,lacticidven:4)  )No results found for this or any previous visit (from the past 240 hour(s)).       Radiology Studies: Dg Chest 2 View  10/25/2015  CLINICAL DATA:  Right-sided weakness beginning several days ago. Metastatic colon carcinoma. EXAM: CHEST  2 VIEW COMPARISON:  09/25/2015 FINDINGS: Right-sided Port-A-Cath remains in appropriate position. Heart size is normal. Both lungs are clear. No evidence of pneumothorax or pleural effusion. IMPRESSION: No active cardiopulmonary disease. Electronically Signed   By: Earle Gell M.D.   On: 10/25/2015 17:26   Ct Head Wo Contrast  10/25/2015  CLINICAL DATA:  Right-sided weakness, history of left parietal metastatic lesion EXAM: CT HEAD WITHOUT CONTRAST TECHNIQUE: Contiguous axial images were obtained from the base of the skull through the vertex without intravenous contrast. COMPARISON:  09/15/2015  FINDINGS: The bony calvarium is intact. The known metastatic lesion is again noted at the vertex in the left parietal lobe similar to that seen on the prior exam. The degree of surrounding vasogenic edema has increased from the prior exam now measuring roughly 7.4 by 4.8 cm in greatest AP and transverse dimensions respectively. No definitive midline shift is seen. No findings to suggest acute hemorrhage or acute infarct are noted. IMPRESSION: Findings consistent with the known metastatic lesion in the left parietal lobe near the vertex. The degree of surrounding vasogenic edema has increased in size when compare with the prior exam. This would correspond with the patient's given clinical history of not taking prescribed steroids. Electronically Signed   By: Inez Catalina M.D.   On: 10/25/2015 17:37        Scheduled Meds: . dexamethasone  4 mg Intravenous Q6H  . feeding supplement (ENSURE ENLIVE)  237 mL Oral BID BM  . ferrous sulfate  325 mg Oral QPC breakfast  . pantoprazole  40 mg Oral Daily  . rivaroxaban  20 mg Oral Q supper  . vitamin B-12  100 mcg Oral  q morning - 10a   Continuous Infusions: . sodium chloride 125 mL/hr at 10/25/15 2316        Time spent: 73min    Domenic Polite, MD [facility] Pager [phone]  If 7PM-7AM, please contact night-coverage [email or web address] Password [email or web address] 10/26/2015, 11:12 AM

## 2015-10-26 NOTE — Evaluation (Signed)
Physical Therapy Evaluation Patient Details Name: [name] [name] MRN: [id number] DOB: [birth year] Today's Date: 10/26/2015   History of Present Illness  [name] [name] is a 65 y.o. 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 Per patient yesterday she was not able to move her R UE or LE at all, and today she is excited that some movment has begun to come back. They started her back on her steriod.   Clinical Impression  Pt with hemiparesis returning this week on R side, however since admission yesterday, patient has seen improvement with some ability to move R side (see ntoes for assessment). Pt to benefit from continued PT here and postacute in [place] setting then with transition to [place] setting to work with neuromuscular strengthening in [place], for transfers , gait and all mobility.     Follow Up Recommendations Home health PT (pt stated even though set back with present hemiparesis , husband can assist her at home at this level . MD stated medicare to begiin in [place] and pt can go [facility]. )    Equipment Recommendations  None recommended by PT    Recommendations for Other Services       Precautions / Restrictions        Mobility  Bed Mobility Overal bed mobility: Needs Assistance Bed Mobility: Supine to Sit;Sit to Supine     Supine to sit: Min assist Sit to supine: Min assist   General bed mobility comments: pt needs cues for awareness and focused to move R arm in safe places for bed mbility. Uses Left LE to scoop R LE for sit to supine. SFor supine to sit, pt needed assistance for R LE movment off bed in safe manner.   Transfers Overall transfer level: Needs assistance Equipment used: Rolling walker (2 wheeled) Transfers: Sit to/from [employer] Sit to Stand: Mod assist (assist with RUE on  walker, and assist with R LE to assure control and prevent buckling )   Squat pivot transfers: Mod assist (assist for [facility] transfer by blocking out RLE to prevent buckling and promote control for quat pivot  both directions. Needed second person for hygien as I help pt for standing. )     General transfer comment: see notes for each transfer listed above   Ambulation/Gait Ambulation/Gait assistance:  (unable to try today due to weakness and instability with standing . Began pre gait activies while assessing R stabilitya dn control at RW in front of bed. weight shifting R and L and mini squats for lift off to stimulate R WBing and control. )              Stairs            Wheelchair Mobility    Modified Rankin (Stroke Patients Only)       Balance Overall balance assessment: Needs assistance Sitting-balance support: Feet supported;No upper extremity supported Sitting balance-Leahy Scale: Normal                                       Pertinent Vitals/Pain Pain Assessment: No/denies pain    Home Living Family/patient expects to be discharged to:: Private residence Living Arrangements: Spouse/significant other Available Help at Discharge: Family;Friend(s);Available 24 hours/day Type of Home: House Home Access: Level  entry     Home Layout: One level Home Equipment: [place] - single point;Walker - 2 wheels      Prior Function Level of Independence: Independent         Comments: After pt recetnly left CIR , until 1 week ago she had become independent back to walking without any device, laundry, cooking, etc. Then a week ago R sided weakness started back and until yesterday with NO movment was present on R side.      Hand Dominance        Extremity/Trunk Assessment   Upper Extremity Assessment:  (R hand grip strong, but persistent until intentional cues and thought. )           Lower Extremity Assessment: RLE deficits/detail RLE Deficits /  Details: 0/5 DF, PF, and hamstring (knee flexion), 3+/5 quad, hip flexion, and hip ABduction, and 3_/5 hip adduction . All sensation fully intact for UE and LE today    Cervical / Trunk Assessment: Other exceptions  Communication   Communication: No difficulties  Cognition Arousal/Alertness: Awake/alert Behavior During Therapy: WFL for tasks assessed/performed Overall Cognitive Status: Within Functional Limits for tasks assessed                      General Comments      Exercises Other Exercises Other Exercises: perfromed pre gait activities in standing (see ntoes in gait section).  Other Exercises: also perfomed supine PNF patterned exercises with RLE. whole patterned knee/hip flexion patern with knee /hip extension pattern. Pt had good strength with this exercise. 10x Other Exercises: assisted bridging 10x Other Exercises: hip AB/AD control exercise in hooklying 10x       Assessment/Plan    PT Assessment Patient needs continued PT services  PT Diagnosis Difficulty walking;Hemiplegia dominant side   PT Problem List Decreased strength;Decreased activity tolerance;Decreased mobility;Decreased balance  PT Treatment Interventions Gait training;Functional mobility training;Therapeutic activities;Therapeutic exercise;Neuromuscular re-education;Patient/family education   PT Goals (Current goals can be found in the Care Plan section) Acute Rehab PT Goals Patient Stated Goal: I want to get this arm and leg working again, God has not brought me this far for nothing!!  PT Goal Formulation: With patient Time For Goal Achievement: 11/08/15 Potential to Achieve Goals: Good    Frequency Min 4X/week   Barriers to discharge        Co-evaluation               End of Session Equipment Utilized During Treatment: Gait belt Activity Tolerance: Patient tolerated treatment well (very motivated and thankful patient ) Patient left: in bed;with call bell/phone within reach;with  bed alarm set;with family/visitor present Nurse Communication: Mobility status    Functional Assessment Tool Used: clinical judgement Functional Limitation: Mobility: Walking and moving around Mobility: Walking and Moving Around Current Status [id number]): At least 40 percent but less than 60 percent impaired, limited or restricted Mobility: Walking and Moving Around Goal Status [phone]): At least 1 percent but less than 20 percent impaired, limited or restricted    Time: [id number] PT Time Calculation (min) (ACUTE ONLY): 49 min   Charges:   PT Evaluation $PT Eval Moderate Complexity: 1 Procedure PT Treatments $Therapeutic Activity: 23-37 mins $Neuromuscular Re-education: 8-22 mins   PT G Codes:   PT G-Codes **NOT FOR INPATIENT CLASS** Functional Assessment Tool Used: clinical judgement Functional Limitation: Mobility: Walking and moving around Mobility: Walking and Moving Around Current Status [id number]): At least 40 percent but less than 60 percent impaired, limited  or restricted Mobility: Walking and Moving Around Goal Status [phone]): At least 1 percent but less than 20 percent impaired, limited or restricted    Clide Dales 10/26/2015, 1:59 PM  Clide Dales, PT Pager: [phone] 10/26/2015

## 2015-10-27 LAB — COMPREHENSIVE METABOLIC PANEL
ALBUMIN: 3.5 g/dL (ref 3.5–5.0)
ALT: 13 U/L — ABNORMAL LOW (ref 14–54)
ANION GAP: 7 (ref 5–15)
AST: 13 U/L — ABNORMAL LOW (ref 15–41)
Alkaline Phosphatase: 59 U/L (ref 38–126)
BUN: 14 mg/dL (ref 6–20)
CHLORIDE: 111 mmol/L (ref 101–111)
CO2: 23 mmol/L (ref 22–32)
Calcium: 11.7 mg/dL — ABNORMAL HIGH (ref 8.9–10.3)
Creatinine, Ser: 0.51 mg/dL (ref 0.44–1.00)
GFR calc Af Amer: >60 mL/min (ref >60)
GFR calc non Af Amer: >60 mL/min (ref >60)
GLUCOSE: 137 mg/dL — AB (ref 65–99)
POTASSIUM: 4.3 mmol/L (ref 3.5–5.1)
SODIUM: 141 mmol/L (ref 135–145)
Total Bilirubin: 0.3 mg/dL (ref 0.3–1.2)
Total Protein: 6.9 g/dL (ref 6.5–8.1)

## 2015-10-27 LAB — MAGNESIUM: Magnesium: 2.3 mg/dL (ref 1.7–2.4)

## 2015-10-27 LAB — CBC
HEMATOCRIT: 32.3 % — AB (ref 36.0–46.0)
Hemoglobin: 10.4 g/dL — ABNORMAL LOW (ref 12.0–15.0)
MCH: 27.6 pg (ref 26.0–34.0)
MCHC: 32.2 g/dL (ref 30.0–36.0)
MCV: 85.7 fL (ref 78.0–100.0)
Platelets: 844 10*3/uL — ABNORMAL HIGH (ref 150–400)
RBC: 3.77 MIL/uL — ABNORMAL LOW (ref 3.87–5.11)
RDW: 17.3 % — ABNORMAL HIGH (ref 11.5–15.5)
WBC: 8.3 10*3/uL (ref 4.0–10.5)

## 2015-10-27 MED ORDER — ZOLEDRONIC ACID 4 MG/5ML IV CONC
4.0000 mg | Freq: Once | INTRAVENOUS | Status: AC
  Administered 2015-10-27: 4 mg via INTRAVENOUS
  Filled 2015-10-27: qty 5

## 2015-10-27 NOTE — Progress Notes (Signed)
Pt had 5 beats of V-tach at 06:30 this am.  Notified NP on call who added a Mag to am labs.  Will continue to monitor patient.

## 2015-10-27 NOTE — Evaluation (Signed)
Occupational Therapy Evaluation Patient Details Name: [name] [name] MRN: [id number] DOB: 12-12-1950 Today's Date: 10/27/2015    History of Present Illness [name] [name] is a 65 y.o. 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 Per patient yesterday she was not able to move her R UE or LE at all, and today she is excited that some movment has begun to come back. They started her back on her steriod.    Clinical Impression  Pt admitted with RUe and RLE weakness. Pt currently with functional limitations due to the deficits listed below (see OT Problem List). Pt will benefit from skilled OT to increase their safety and independence with ADL and functional mobility for ADL to facilitate discharge to venue listed below.     Follow Up Recommendations  Home health OT;CIR;SNF;Other (comment) (depending on DC plan and progress)    Equipment Recommendations  None recommended by OT       Precautions / Restrictions Precautions Precautions: Fall Precaution Comments: decreased awareness and function RUE      Mobility Bed Mobility   Bed Mobility: Rolling;Supine to Sit Rolling: Min assist   Supine to sit: Min assist Sit to supine: Min assist   General bed mobility comments: VC to protect RUE.  Pts needs VC to tend to RUE.  Explained this to pt.  Need to educate caregivers  Transfers                 General transfer comment: pt declined OOB.         ADL Overall ADL's : Needs assistance/impaired       Grooming Details (indicate cue type and reason): encouraged pt to use RUE and this was VERy hard for pt. Pt seemed to have some inattention to R side as well as some challenges following directions                                               Pertinent Vitals/Pain Pain Assessment: No/denies  pain     Hand Dominance  R- encouraged pt to use RUE as much as possible. Attempted reaching for items incorporating grasp and release and this was challenging.  Pt seemed to have some inattention to RUE during functional task   Extremity/Trunk Assessment Upper Extremity Assessment Upper Extremity Assessment: RUE deficits/detail RUE Deficits / Details: Pt able to move RUE against gravity but with decreased control.   RUE Coordination: decreased fine motor;decreased gross motor           Communication Communication Communication: No difficulties   Cognition Arousal/Alertness: Awake/alert   Overall Cognitive Status: Impaired/Different from baseline Area of Impairment: Following commands;Awareness;Problem solving             Problem Solving: Difficulty sequencing;Requires verbal cues;Requires tactile cues     General Comments       Exercises    AROM Of RUE in all planes of movement. When pt performed RUE shoulder flexion- OT ask pt to hold. Pt not able to hold in position. RUE seemed to drift and pt with decrease awareness        Home Living Family/patient expects to be discharged to:: Private residence Living Arrangements: Spouse/significant other Available Help at Discharge: Family;Friend(s);Available 24 hours/day Type of Home:  House Home Access: Level entry     Home Layout: One level     Bathroom Shower/Tub: Tub/shower unit;Walk-in shower   Bathroom Toilet: Standard Bathroom Accessibility: Yes   Home Equipment: Cane - single point;Walker - 2 wheels          Prior Functioning/Environment Level of Independence: Independent        Comments: After pt recetnly left CIR , until 1 week ago she had become independent back to walking without any device, laundry, cooking, etc. Then a week ago R sided weakness started back and until yesterday with NO movment was present on R side.     OT Diagnosis: Generalized weakness;Hemiplegia dominant side;Ataxia;Altered  mental status;Cognitive deficits   OT Problem List: Decreased strength;Decreased activity tolerance;Impaired balance (sitting and/or standing);Decreased safety awareness;Decreased cognition;Impaired UE functional use;Decreased coordination;Decreased range of motion   OT Treatment/Interventions: Self-care/ADL training;DME and/or AE instruction;Patient/family education;Neuromuscular education    OT Goals(Current goals can be found in the care plan section) Acute Rehab OT Goals Patient Stated Goal: I want to get this arm and leg working again, God has not brought me this far for nothing!!  OT Goal Formulation: With patient Time For Goal Achievement: 2015/11/26 Potential to Achieve Goals: Good  OT Frequency: Min 3X/week              End of Session Nurse Communication: Mobility status  Activity Tolerance: Patient limited by fatigue Patient left: in bed;with call bell/phone within reach;with family/visitor present   Time: 1455-1514 OT Time Calculation (min): 19 min Charges:  OT General Charges $OT Visit: 1 Procedure OT Evaluation $OT Eval Moderate Complexity: 1 Procedure G-Codes:    Betsy Pries 2015-11-26, 3:48 PM

## 2015-10-27 NOTE — Progress Notes (Signed)
Physical Therapy Treatment Patient Details Name: [name] [name] MRN: [id number] DOB: Mar 16, 1951 Today's Date: 10/27/2015    History of Present Illness [name] [name] is a 65 y.o. 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 Per patient yesterday she was not able to move her R UE or LE at all, and today she is excited that some movment has begun to come back. They started her back on her steriod.     PT Comments    Assisted pt OOB to amb using B platform EVA walker due to noted R UE/R LE weakness.  Spouse assisted by following with recliner.    Follow Up Recommendations  Home health PT (per chart review, spouse wants to take pt home)     Equipment Recommendations       Recommendations for Other Services       Precautions / Restrictions Precautions Precautions: Fall Precaution Comments: R UE/LE hamiparesis Restrictions Weight Bearing Restrictions: No    Mobility  Bed Mobility Overal bed mobility: Needs Assistance Bed Mobility: Supine to Sit Rolling: Min assist   Supine to sit: Min assist;Mod assist Sit to supine: Min assist   General bed mobility comments: decreased functional use R UE nad R LE weakness, pt required assist to complete.   Transfers Overall transfer level: Needs assistance Equipment used: None Transfers: Sit to/from Stand Sit to Stand: Mod assist         General transfer comment: 50% VC's on proper hand placement and assist to steady.   Ambulation/Gait Ambulation/Gait assistance: Max assist Ambulation Distance (Feet): 24 Feet Assistive device: Bilateral platform walker (EVA walker) Gait Pattern/deviations: Step-to pattern;Step-through pattern;Narrow base of support;Drifts right/left;Antalgic Gait velocity: decreased   General Gait Details: used B platform EVA walker for increased  support.  Very unsteady gait with noted R LE foot slap, 50% scissor gait and R LE hyper step.  Several lateral LOB with poor self correction.  Spouse assisted by following with recliner.     Stairs            Wheelchair Mobility    Modified Rankin (Stroke Patients Only)       Balance                                    Cognition Arousal/Alertness: Awake/alert Behavior During Therapy: WFL for tasks assessed/performed Overall Cognitive Status: Impaired/Different from baseline Area of Impairment: Following commands;Awareness;Problem solving             Problem Solving: Difficulty sequencing;Requires verbal cues;Requires tactile cues      Exercises      General Comments        Pertinent Vitals/Pain Pain Assessment: No/denies pain    Home Living Family/patient expects to be discharged to:: Private residence Living Arrangements: Spouse/significant other Available Help at Discharge: Family;Friend(s);Available 24 hours/day Type of Home: House Home Access: Level entry   Home Layout: One level Home Equipment: Cane - single point;Walker - 2 wheels      Prior Function Level of Independence: Independent      Comments: After pt recetnly left CIR , until 1 week ago she had become independent back to walking without any device, laundry, cooking, etc. Then a week ago R sided weakness started back and until yesterday with NO movment was present on R  side.    PT Goals (current goals can now be found in the care plan section) Acute Rehab PT Goals Patient Stated Goal: I want to get this arm and leg working again, God has not brought me this far for nothing!!  Progress towards PT goals: Progressing toward goals    Frequency  Min 4X/week    PT Plan Current plan remains appropriate    Co-evaluation             End of Session Equipment Utilized During Treatment: Gait belt Activity Tolerance: Treatment limited secondary to medical complications  (Comment) Patient left: in bed;with call bell/phone within reach;with bed alarm set;with family/visitor present     Time: 1510-1540 PT Time Calculation (min) (ACUTE ONLY): 30 min  Charges:  $Gait Training: 8-22 mins $Therapeutic Activity: 8-22 mins                    G Codes:      Rica Koyanagi  PTA [facility]  Acute  Rehab Pager      [phone]

## 2015-10-27 NOTE — Progress Notes (Signed)
PROGRESS NOTE    [name] [name]  [id number] DOB: 1950-09-06 DOA: 10/25/2015 PCP: Kristine Garbe, MD  Outpatient Specialists: Dr.Feng and Dr.Moody Brief Narrative:[name] [name] is a 65 y.o. 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 Pt has hx of colon cancer which metastasized to the brain, s/p 1 session of radiation treatment. She was supposed to take Decadron, but had not taken it for 2 weeks after she ran out. Restarted on IV decadron and clinically improving  Assessment & Plan:   Right sided weakness:  -CT-head showed worsening edema around known L brain metastasis, most likely due to stopping taking Decadron. -s/p SRS on 09/18/15 for solitary brain mets -resumed decadron and clinically improving -s/p PT eval: Home health PT recommended -d/w Dr.Feng regarding continuing xarelto at this dose  Hypercalcemia of malignancy -Ca was 12 on admission then down to 11, and 11.7 today -cut down IVF, s/p Zometa last month -will give another dose of Zometa today  Colorectal cancer: stage IV, metastasized to brain and liver. Obstructing s/p stent x2, ostomy, then LAR 03/06/2015.  -s/p SRS 09/18/15 -under observation, followed by Dr.Feng  Hx of Pulmonary embolism ([place]) -in 11/2014 -on Xarelto  Protein-calorie malnutrition, moderate ([place]): -Ensure  DVT ppx: on Xarelto  Code Status: Full code Family Communication: none at bed side Disposition Plan: Home tomorrow if stable  Consultants: d/w Dr.Feng Procedures:  Antimicrobials:  Subjective: Feels better, arm and leg improving, feels stronger, Objective: Filed Vitals:   10/26/15 0532 10/26/15 1510 10/26/15 2028 10/27/15 0457  BP: 119/71 118/58 121/63 124/69  Pulse: 88 86 79 73  Temp: 98 F (36.7 C) 98.2 F (36.8 C) 97.9 F (36.6 C) 97.7 F (36.5 C)  TempSrc:  Oral Oral Oral Oral  Resp: 16 20 16 16   Height:      Weight:      SpO2: 100% 98% 100% 99%    Intake/Output Summary (Last 24 hours) at 10/27/15 1030 Last data filed at 10/27/15 1007  Gross per 24 hour  Intake 2317.5 ml  Output   3000 ml  Net -682.5 ml   Filed Weights   10/25/15 2146  Weight: 68.312 kg (150 lb 9.6 oz)    Examination:  General exam: Appears calm and comfortable  Respiratory system: Clear to auscultation. Respiratory effort normal. Cardiovascular system: S1 & S2 heard, RRR. No JVD, murmurs, rubs, gallops or clicks. No pedal edema. Gastrointestinal system: Abdomen is nondistended, soft and nontender. No organomegaly or masses felt. Normal bowel sounds heard. Central nervous system: Alert and oriented. Mild R leg weakness and R hand weakness, improving from yesterday  Extremities: Symmetric 5 x 5 power. Skin: No rashes, lesions or ulcers Psychiatry: Judgement and insight appear normal. Mood & affect appropriate.     Data Reviewed: I have personally reviewed following labs and imaging studies  CBC:  Recent Labs Lab 10/25/15 1559 10/26/15 0522 10/27/15 0456  WBC 4.5 5.5 8.3  HGB 9.7* 10.6* 10.4*  HCT 29.9* 32.7* 32.3*  MCV 85.7 85.4 85.7  PLT 623* 791* 0000000*   Basic Metabolic Panel:  Recent Labs Lab 10/25/15 1558 10/26/15 0522 10/27/15 0456  NA 138 140 141  K 3.9 4.4 4.3  CL 107 110 111  CO2 20* 21* 23  GLUCOSE 130* 154* 137*  BUN 10 12 14   CREATININE 0.53 0.54 0.51  CALCIUM 12.1* 11.0* 11.7*  MG  --   --  2.3   GFR: Estimated Creatinine Clearance: 66.1 mL/min (by C-G formula based on Cr of 0.51). Liver Function Tests:  Recent Labs Lab 10/25/15 1558 10/27/15 0456  AST 19 13*  ALT 13* 13*  ALKPHOS 63 59  BILITOT 0.4 0.3  PROT 6.8 6.9  ALBUMIN 3.2* 3.5   No results for input(s): LIPASE, AMYLASE in the last 168 hours. No results for input(s): AMMONIA in the last 168 hours. Coagulation Profile:  Recent Labs Lab 10/25/15 2050    INR 1.14   Cardiac Enzymes: No results for input(s): CKTOTAL, CKMB, CKMBINDEX, TROPONINI in the last 168 hours. BNP (last 3 results) No results for input(s): PROBNP in the last 8760 hours. HbA1C: No results for input(s): HGBA1C in the last 72 hours. CBG: No results for input(s): GLUCAP in the last 168 hours. Lipid Profile: No results for input(s): CHOL, HDL, LDLCALC, TRIG, CHOLHDL, LDLDIRECT in the last 72 hours. Thyroid Function Tests: No results for input(s): TSH, T4TOTAL, FREET4, T3FREE, THYROIDAB in the last 72 hours. Anemia Panel: No results for input(s): VITAMINB12, FOLATE, FERRITIN, TIBC, IRON, RETICCTPCT in the last 72 hours. Urine analysis:    Component Value Date/Time   COLORURINE YELLOW 10/25/2015 [place] 10/25/2015 1604   LABSPEC 1.005 10/25/2015 1604   PHURINE 6.5 10/25/2015 1604   GLUCOSEU NEGATIVE 10/25/2015 1604   HGBUR NEGATIVE 10/25/2015 1604   BILIRUBINUR NEGATIVE 10/25/2015 1604   KETONESUR NEGATIVE 10/25/2015 1604   PROTEINUR NEGATIVE 10/25/2015 1604   UROBILINOGEN 0.2 11/13/2014 2008   NITRITE NEGATIVE 10/25/2015 1604   LEUKOCYTESUR TRACE* 10/25/2015 1604   Sepsis Labs: @LABRCNTIP (procalcitonin:4,lacticidven:4)  )No results found for this or any previous visit (from the past 240 hour(s)).       Radiology Studies: Dg Chest 2 View  10/25/2015  CLINICAL DATA:  Right-sided weakness beginning several days ago. Metastatic colon carcinoma. EXAM: CHEST  2 VIEW COMPARISON:  09/25/2015 FINDINGS: Right-sided Port-A-Cath remains in appropriate position. Heart size is normal. Both lungs are clear. No evidence of pneumothorax or pleural effusion. IMPRESSION: No active cardiopulmonary disease. Electronically Signed   By: Earle Gell M.D.   On: 10/25/2015 17:26   Ct Head Wo Contrast  10/25/2015  CLINICAL DATA:  Right-sided weakness, history of left parietal metastatic lesion EXAM: CT HEAD WITHOUT CONTRAST TECHNIQUE: Contiguous axial images were  obtained from the base of the skull through the vertex without intravenous contrast. COMPARISON:  09/15/2015 FINDINGS: The bony calvarium is intact. The known metastatic lesion is again noted at the vertex in the left parietal lobe similar to that seen on the prior exam. The degree of surrounding vasogenic edema has increased from the prior exam now measuring roughly 7.4 by 4.8 cm in greatest AP and transverse dimensions respectively. No definitive midline shift is seen. No findings to suggest acute hemorrhage or acute infarct are noted. IMPRESSION: Findings consistent with the known metastatic lesion in the left parietal lobe near the vertex. The degree of surrounding vasogenic edema has increased in size when compare with the prior exam. This would correspond with the patient's given clinical history of not taking prescribed steroids. Electronically Signed   By: Inez Catalina M.D.   On: 10/25/2015 17:37        Scheduled Meds: . dexamethasone  4 mg Intravenous Q6H  . feeding supplement (ENSURE ENLIVE)  237 mL Oral BID BM  . ferrous sulfate  325 mg Oral QPC breakfast  . pantoprazole  40 mg Oral Daily  . rivaroxaban  20 mg Oral  Q supper  . vitamin B-12  100 mcg Oral q morning - 10a   Continuous Infusions:     LOS: 1 day    Time spent: 80min    Domenic Polite, MD [facility] Pager [phone]  If 7PM-7AM, please contact night-coverage [email or web address] Password [email or web address] 10/27/2015, 10:30 AM

## 2015-10-28 LAB — COMPREHENSIVE METABOLIC PANEL
ALT: 16 U/L (ref 14–54)
ANION GAP: 12 (ref 5–15)
AST: 19 U/L (ref 15–41)
Albumin: 3.9 g/dL (ref 3.5–5.0)
Alkaline Phosphatase: 68 U/L (ref 38–126)
BILIRUBIN TOTAL: 0.3 mg/dL (ref 0.3–1.2)
BUN: 17 mg/dL (ref 6–20)
CALCIUM: 12.7 mg/dL — AB (ref 8.9–10.3)
CHLORIDE: 104 mmol/L (ref 101–111)
CO2: 23 mmol/L (ref 22–32)
Creatinine, Ser: 0.63 mg/dL (ref 0.44–1.00)
GFR calc Af Amer: >60 mL/min (ref >60)
GFR calc non Af Amer: >60 mL/min (ref >60)
Glucose, Bld: 157 mg/dL — ABNORMAL HIGH (ref 65–99)
POTASSIUM: 3.9 mmol/L (ref 3.5–5.1)
SODIUM: 139 mmol/L (ref 135–145)
Total Protein: 7.9 g/dL (ref 6.5–8.1)

## 2015-10-28 LAB — CALCIUM, IONIZED: CALCIUM, IONIZED, SERUM: 7.1 mg/dL — AB (ref 4.5–5.6)

## 2015-10-28 MED ORDER — DEXAMETHASONE 2 MG PO TABS: 4.0000 mg | ORAL_TABLET | Freq: Three times a day (TID) | ORAL | Status: DC

## 2015-10-28 MED ORDER — HYDROCODONE-ACETAMINOPHEN 5-325 MG PO TABS: 1.0000 | ORAL_TABLET | Freq: Four times a day (QID) | ORAL | Status: DC | PRN

## 2015-10-28 NOTE — Progress Notes (Signed)
Spoke with pt and daughter at bedside concerning [facility]. [facility] was selected. Referral given to in house rep. Pt asked about WC that was ordered prior to admission. [facility] was called to check on WC.

## 2015-10-28 NOTE — Discharge Summary (Signed)
Physician Discharge Summary  [name] [name] [id number] DOB: 07-Jan-1951 DOA: 10/25/2015  PCP: Kristine Garbe, MD  Admit date: 10/25/2015 Discharge date: 10/28/2015  Time spent: 45 minutes  Recommendations for Outpatient Follow-up:  1. Dr.Feng at [facility] in 1 week 2. Dr.Moody in 1 week, office will call pt 3. Home health PT/OT/Aide  Discharge Diagnoses:  Principal Problem:   Right sided weakness   Solitary brain metastasis   Hypercalcemia   Colorectal cancer, stage IV, obstructing s/p stent x2, ostomy, then LAR 03/06/2015   Migrated colon stent   Pulmonary embolism (HCC)   Long-term (current) use of anticoagulants   Brain metastases ([place])   Metastatic colon cancer to liver ([place])   Anemia of chronic disease   Protein-calorie malnutrition, moderate ([place])   Pressure ulcer   Discharge Condition: stable  Diet recommendation: heart healthy  Filed Weights   10/25/15 2146  Weight: 68.312 kg (150 lb 9.6 oz)    History of present illness:  Chief Complaint: Right-sided weakness HPI: [name] [name] is a 65 y.o. 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 Pt has hx of colon cancer which metastasized to the brain, she status post SRS to brain last month. She is supposed to take Decadron, but had not taken it for 2 weeks. She reported increasing right-sided weakness. The patient reports she has had right-sided weakness since before her diagnosis of her tumor, but it has been worsening over the last week. She found it more difficult to ambulate with her walker  Hospital Course:  Right sided weakness:  -CT-head on admission showed worsening edema around known L brain metastasis, most likely due to stopping taking Decadron. -s/p SRS on 09/18/15 for solitary brain mets -resumed decadron and clinically improved -Seen by  Dr.Feng her oncologist yetserday -s/p PT eval: Home health PT/OT recommended, Dr.Feng advised to continue Decadron 4mg  TID at discharge and it will be titrated down as outpatient -I also d/w Dr.Feng regarding xarelto, she recommended continuing this for now since its a small brain metastasis  Hypercalcemia of malignancy -Ca was 12 on admission then down to 11, and 11.7 4/24 -cut down IVF, s/p Zometa last month -given another dose of Zometa 4/24  Colorectal cancer: stage IV, metastasized to brain and liver. Obstructing s/p stent x2, ostomy, then LAR 03/06/2015.  -s/p SRS 09/18/15 -under observation, followed by Dr.Feng -close FU with Dr.Feng and Dr.Moody in 1 week  Hx of Pulmonary embolism ([place]) -in 11/2014 -on Xarelto, continue for now, see above discussion  Protein-calorie malnutrition, moderate ([place]): -Ensure/supplements advised  Consultations:  Dr.Feng, Oncology  Discharge Exam: Filed Vitals:   10/27/15 2153 10/28/15 0614  BP: 107/54 117/62  Pulse: 84 72  Temp: 98.7 F (37.1 C) 98.4 F (36.9 C)  Resp: 18 18    General: AAOx3 Cardiovascular: S1s2/RRR Respiratory: CTAB  Discharge Instructions   Discharge Instructions    Diet - low sodium heart healthy    Complete by:  As directed      Increase activity slowly    Complete by:  As directed           Current Discharge Medication List    CONTINUE these medications which have CHANGED   Details  dexamethasone (DECADRON) 2 MG tablet Take 2 tablets (4 mg total) by mouth 3 (three) times daily. Qty: 90 tablet, Refills: 0      CONTINUE  these medications which have NOT CHANGED   Details  acetaminophen (TYLENOL) 500 MG tablet Take 500 mg by mouth every 6 (six) hours as needed for moderate pain or headache.     Cyanocobalamin (VITAMIN B 12 PO) Take 1 tablet by mouth every morning.    IRON PO Take 1 tablet by mouth every morning.    pantoprazole (PROTONIX) 40 MG tablet Take 1 tablet by mouth daily. Refills: 1     polyethylene glycol powder (GLYCOLAX/MIRALAX) powder TAKE 34 G (2 DOSES) BY MOUTH DAILY AS NEEDED FOR MILD CONSTIPATION OR MODERATE CONSTIPATION Qty: 255 g, Refills: 0    rivaroxaban (XARELTO) 20 MG TABS tablet Take 1 tablet (20 mg total) by mouth daily with supper. Qty: 30 tablet, Refills: 3       Allergies  Allergen Reactions  . Oxycontin [Oxycodone Hcl] Anaphylaxis, Hives and Itching  . Codeine Itching and Nausea And Vomiting   Follow-up Information    Follow up with Truitt Merle, MD In 1 week.   Specialties:  Hematology, Oncology   Contact information:   [place] [address] [phone]       Follow up with Kyung Rudd, MD In 1 week.   Specialty:  Radiation Oncology   Why:  Office will call you   Contact information:   [place] [place] [address] [phone]        The results of significant diagnostics from this hospitalization (including imaging, microbiology, ancillary and laboratory) are listed below for reference.    Significant Diagnostic Studies: Dg Chest 2 View  10/25/2015  CLINICAL DATA:  Right-sided weakness beginning several days ago. Metastatic colon carcinoma. EXAM: CHEST  2 VIEW COMPARISON:  09/25/2015 FINDINGS: Right-sided Port-A-Cath remains in appropriate position. Heart size is normal. Both lungs are clear. No evidence of pneumothorax or pleural effusion. IMPRESSION: No active cardiopulmonary disease. Electronically Signed   By: Earle Gell M.D.   On: 10/25/2015 17:26   Ct Head Wo Contrast  10/25/2015  CLINICAL DATA:  Right-sided weakness, history of left parietal metastatic lesion EXAM: CT HEAD WITHOUT CONTRAST TECHNIQUE: Contiguous axial images were obtained from the base of the skull through the vertex without intravenous contrast. COMPARISON:  09/15/2015 FINDINGS: The bony calvarium is intact. The known metastatic lesion is again noted at the vertex in the left parietal lobe similar to that seen on the prior exam. The degree of  surrounding vasogenic edema has increased from the prior exam now measuring roughly 7.4 by 4.8 cm in greatest AP and transverse dimensions respectively. No definitive midline shift is seen. No findings to suggest acute hemorrhage or acute infarct are noted. IMPRESSION: Findings consistent with the known metastatic lesion in the left parietal lobe near the vertex. The degree of surrounding vasogenic edema has increased in size when compare with the prior exam. This would correspond with the patient's given clinical history of not taking prescribed steroids. Electronically Signed   By: Inez Catalina M.D.   On: 10/25/2015 17:37    Microbiology: No results found for this or any previous visit (from the past 240 hour(s)).   Labs: Basic Metabolic Panel:  Recent Labs Lab 10/25/15 1558 10/26/15 0522 10/27/15 0456  NA 138 140 141  K 3.9 4.4 4.3  CL 107 110 111  CO2 20* 21* 23  GLUCOSE 130* 154* 137*  BUN 10 12 14   CREATININE 0.53 0.54 0.51  CALCIUM 12.1* 11.0* 11.7*  MG  --   --  2.3  Liver Function Tests:  Recent Labs Lab 10/25/15 1558 10/27/15 0456  AST 19 13*  ALT 13* 13*  ALKPHOS 63 59  BILITOT 0.4 0.3  PROT 6.8 6.9  ALBUMIN 3.2* 3.5   No results for input(s): LIPASE, AMYLASE in the last 168 hours. No results for input(s): AMMONIA in the last 168 hours. CBC:  Recent Labs Lab 10/25/15 1559 10/26/15 0522 10/27/15 0456  WBC 4.5 5.5 8.3  HGB 9.7* 10.6* 10.4*  HCT 29.9* 32.7* 32.3*  MCV 85.7 85.4 85.7  PLT 623* 791* 844*   Cardiac Enzymes: No results for input(s): CKTOTAL, CKMB, CKMBINDEX, TROPONINI in the last 168 hours. BNP: BNP (last 3 results) No results for input(s): BNP in the last 8760 hours.  ProBNP (last 3 results) No results for input(s): PROBNP in the last 8760 hours.  CBG: No results for input(s): GLUCAP in the last 168 hours.     SignedDomenic Polite MD.  [facility] 10/28/2015, 11:10 AM

## 2015-10-28 NOTE — Progress Notes (Signed)
Occupational Therapy Treatment Patient Details Name: [name] [name] MRN: [id number] DOB: 07-16-1950 Today's Date: 10/28/2015    History of present illness [name] [name] is a 65 y.o. 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 Per patient yesterday she was not able to move her R UE or LE at all, and today she is excited that some movment has begun to come back. They started her back on her steriod.    OT comments  Patient making slow progress towards OT goals, will continue plan of care for now. Pt is limited by decreased strength and coordination in RUE/RLE, as well as decreased proprioception on right side. Pt with decreased safety awareness, decreased problem solving, and decreased ability to follow one step commands consistently. According to daughter, they plan to hire caregivers as patient's family works during the day. Pt will need strict 24/7 supervision/assistance as it is unsafe for her to transfer by herself, she requires mod assist for basic stand pivot transfers. At this time, OT is recommending CIR as I clinically believe pt will benefit from CIR to increase independence and overall safety with ADLs and functional mobility prior to discharging back home.    Follow Up Recommendations  CIR;Supervision/Assistance - 24 hour (IF pt going home, do recommend 24/7 supervision/assistance and HHOT)    Equipment Recommendations  None recommended by OT    Recommendations for Other Services  None at this time  Precautions / Restrictions Precautions Precautions: Fall Precaution Comments: R UE/LE hemiparesis Restrictions Weight Bearing Restrictions: No    Mobility Bed Mobility Overal bed mobility: Needs Assistance Bed Mobility: Supine to Sit     Supine to sit: Min assist     General bed mobility comments: decreased  functional use R UE and R LE weakness, pt required assistance for RLE management and bringing right hip towards EOB. Pt with decreased awareness of where R side of body is in space; decreased proprioception.   Transfers Overall transfer level: Needs assistance Equipment used: None Transfers: Sit to/from [facility] Sit to Stand: Mod assist Stand pivot transfers: Mod assist       General transfer comment: Multimodal cueing for safety, hand placement, sequencing, RLE management during transfers.     Balance Overall balance assessment: Needs assistance Sitting-balance support: Feet supported;No upper extremity supported Sitting balance-Leahy Scale: Fair     Standing balance support: No upper extremity supported;During functional activity Standing balance-Leahy Scale: Poor Standing balance comment: pt with decreased right side proprioception    ADL Overall ADL's : Needs assistance/impaired Eating/Feeding: Minimal assistance Eating/Feeding Details (indicate cue type and reason): mainly using LUE due to weakness and decreased coordination in RUE Grooming: Minimal assistance Grooming Details (indicate cue type and reason): Mainly using LUE due to weakness and decreased coordination in RUE. Pt continues to present with inattention to right side and difficult following directions.  Upper Body Bathing: Sitting;Moderate assistance   Lower Body Bathing: Moderate assistance;Sit to/from stand   Upper Body Dressing : Moderate assistance;Sitting   Lower Body Dressing: Maximal assistance;Sit to/from stand   Toilet Transfer: Moderate assistance;Stand-pivot;BSC   Toileting- Clothing Manipulation and Hygiene: Moderate assistance;Sit to/from Nurse, children's Details (indicate cue type and reason): did not occur, safety concern at this time Functional mobility during ADLs: Moderate assistance;Cueing for safety;Cueing for sequencing General ADL Comments: Pt limited by  decreased ability to  problem solve and follow one step commands. Pt also limited due to decreased strength and coordination in RUE, as well as inattention to right side.      Cognition   Behavior During Therapy: WFL for tasks assessed/performed Overall Cognitive Status: Impaired/Different from baseline Area of Impairment: Following commands;Awareness;Problem solving;Safety/judgement     Memory: Decreased short-term memory  Following Commands: Follows one step commands inconsistently;Follows one step commands with increased time Safety/Judgement: Decreased awareness of safety;Decreased awareness of deficits Awareness: Intellectual Problem Solving: Difficulty sequencing;Requires verbal cues;Requires tactile cues        Exercises General Exercises - Upper Extremity Shoulder Flexion: AAROM;Right;10 reps;Seated Shoulder Extension: AAROM;Right;10 reps;Seated Elbow Flexion: Strengthening;Right;10 reps;Seated Elbow Extension: Strengthening;Right;10 reps;Seated Wrist Flexion: AAROM;Right;10 reps;Seated Wrist Extension: AAROM;Right;10 reps;Seated Digit Composite Flexion: AROM;Right;10 reps;Seated Composite Extension: AROM;Right;10 reps;Seated           Pertinent Vitals/ Pain       Pain Assessment: No/denies pain   Frequency Min 3X/week     Progress Toward Goals  OT Goals(current goals can now befound in the care plan section)  Progress towards OT goals: Progressing toward goals  Acute Rehab OT Goals Patient Stated Goal: go home OT Goal Formulation: With patient Time For Goal Achievement: 11/04/15 (enerted in error during evaluation) Potential to Achieve Goals: Good  Plan Discharge plan needs to be updated    End of Session Equipment Utilized During Treatment: Gait belt   Activity Tolerance Patient tolerated treatment well   Patient Left in chair;with call bell/phone within reach;with chair alarm set  Nurse Communication Other (comment) (pt with +urination)     Time:  [id number] OT Time Calculation (min): 25 min  Charges: OT General Charges $OT Visit: 1 Procedure OT Treatments $Self Care/Home Management : 8-22 mins $Therapeutic Exercise: 8-22 mins  Chrys Racer , MS, OTR/L, CLT Pager: [phone]  10/28/2015, 1:36 PM

## 2015-10-30 ENCOUNTER — Telehealth: Payer: Self-pay | Admitting: *Deleted

## 2015-10-30 NOTE — Telephone Encounter (Signed)
error 

## 2015-10-30 NOTE — Telephone Encounter (Signed)
Called patient concerning her  Symptoms, she stated she is taking decadron 3x a day and has improved gratly ,feeling in her feet again, and hand, she stated no head aches,mausea, she feels much better,will relay this to Dr. Lisbeth Renshaw, will need to taper will call back after speaking with MD,patient thanked this Rn for checking on her 10:11 AM

## 2015-10-30 NOTE — Telephone Encounter (Signed)
She had essentially told me that she had tapered off... Not stopped abruptly! Since she was symptomatic and came in to the ED with weakness and CT revealed edema, I'd agree she needs to stay on steroids, and we can help or defer to Dr. Marin Olp on how to taper her steroids once she leaves the hospital.

## 2015-10-31 ENCOUNTER — Telehealth: Payer: Self-pay | Admitting: *Deleted

## 2015-10-31 NOTE — Telephone Encounter (Signed)
[name] [name] called requesting recent office notes for obtaining a wheelchair.  Forwarded call to our HIM department.

## 2015-10-31 NOTE — Telephone Encounter (Signed)
Called patient and spoke with [name] [name] and her mother  For decadron taper, gave instruction to take  4mg  tablet  3x day for 1 more week,then 4mg  tablet  Twice a day for 1 week, then 4mg  tablet daily x 1 week, then 1/2 tablet(2mg ) daily x 1 week, patient and mother heard verbal read back and also will mail instructions printed as well to her home, patient stated she is moving  Extremities around better, no headaches, thanked me for calling again  9:45 AM

## 2015-11-05 ENCOUNTER — Telehealth: Payer: Self-pay | Admitting: *Deleted

## 2015-11-05 NOTE — Telephone Encounter (Signed)
Tiffany @[facility] called to report that patient's insurance payor has changed and she is now eligible for OT and PT.  She would like a verbal order to get a OT and PT evaluation.  Discussed with Dr. Burr Medico and she is fine with this.  Called and spoke with Tiffany and gave her the verbal order from Dr.Feng.

## 2015-11-07 ENCOUNTER — Encounter: Payer: Self-pay | Admitting: Hematology

## 2015-11-07 NOTE — Progress Notes (Signed)
Faxed notes and form for wheelchair  [phone]

## 2015-11-10 ENCOUNTER — Telehealth: Payer: Self-pay | Admitting: *Deleted

## 2015-11-10 ENCOUNTER — Other Ambulatory Visit: Payer: Self-pay | Admitting: Hematology

## 2015-11-10 DIAGNOSIS — I2782 Chronic pulmonary embolism: Secondary | ICD-10-CM

## 2015-11-10 MED ORDER — RIVAROXABAN 20 MG PO TABS: 20.0000 mg | ORAL_TABLET | Freq: Every day | ORAL | Status: DC

## 2015-11-10 NOTE — Telephone Encounter (Signed)
"  I'm calling to ask Dr. Burr Medico to send my Xarelto refill to [facility] on [address]." Confirmed address of [facility] with this call.   Will notify Dr. Burr Medico of this request.  Patient reports she has not missed any doses.

## 2015-11-10 NOTE — Telephone Encounter (Signed)
I have refilled for her today.  [name] [name]  11/10/2015

## 2015-11-11 MED ORDER — RIVAROXABAN 20 MG PO TABS: 20.0000 mg | ORAL_TABLET | Freq: Every day | ORAL | Status: DC

## 2015-11-14 ENCOUNTER — Other Ambulatory Visit: Payer: Self-pay

## 2015-11-14 DIAGNOSIS — Z95828 Presence of other vascular implants and grafts: Secondary | ICD-10-CM

## 2015-11-17 ENCOUNTER — Other Ambulatory Visit: Payer: Medicaid Other

## 2015-11-17 ENCOUNTER — Ambulatory Visit (HOSPITAL_BASED_OUTPATIENT_CLINIC_OR_DEPARTMENT_OTHER): Payer: Medicare Other

## 2015-11-17 ENCOUNTER — Ambulatory Visit: Payer: Medicaid Other | Admitting: Hematology

## 2015-11-17 VITALS — BP 126/72 | HR 123 | Resp 18

## 2015-11-17 DIAGNOSIS — Z95828 Presence of other vascular implants and grafts: Secondary | ICD-10-CM

## 2015-11-17 DIAGNOSIS — Z452 Encounter for adjustment and management of vascular access device: Secondary | ICD-10-CM

## 2015-11-17 DIAGNOSIS — C19 Malignant neoplasm of rectosigmoid junction: Secondary | ICD-10-CM | POA: Diagnosis not present

## 2015-11-17 MED ORDER — HEPARIN SOD (PORK) LOCK FLUSH 100 UNIT/ML IV SOLN
500.0000 [IU] | Freq: Once | INTRAVENOUS | Status: AC | PRN
  Administered 2015-11-17: 500 [IU] via INTRAVENOUS
  Filled 2015-11-17: qty 5

## 2015-11-17 MED ORDER — SODIUM CHLORIDE 0.9 % IJ SOLN
10.0000 mL | INTRAMUSCULAR | Status: DC | PRN
Start: 2015-11-17 — End: 2015-11-17
  Administered 2015-11-17: 10 mL via INTRAVENOUS
  Filled 2015-11-17: qty 10

## 2015-11-17 NOTE — Patient Instructions (Signed)

## 2015-11-20 DIAGNOSIS — C7931 Secondary malignant neoplasm of brain: Secondary | ICD-10-CM | POA: Diagnosis not present

## 2015-11-20 DIAGNOSIS — G8191 Hemiplegia, unspecified affecting right dominant side: Secondary | ICD-10-CM | POA: Diagnosis not present

## 2015-11-20 DIAGNOSIS — C189 Malignant neoplasm of colon, unspecified: Secondary | ICD-10-CM | POA: Diagnosis not present

## 2015-11-20 DIAGNOSIS — I10 Essential (primary) hypertension: Secondary | ICD-10-CM | POA: Diagnosis not present

## 2015-11-20 NOTE — Addendum Note (Signed)
Encounter addended by: Kary Kos, MD on: 11/20/2015  9:43 AM<BR>     Documentation filed: Notes Section

## 2015-11-20 NOTE — Procedures (Signed)
  Name: ZARRIYAH DING  MRN: [id number]  Date: 09/18/2015   DOB: 1950/10/19  Stereotactic Radiosurgery Operative Note  PRE-OPERATIVE DIAGNOSIS:  Solitary Brain Metastasis  POST-OPERATIVE DIAGNOSIS:  Solitary Brain Metastasis  PROCEDURE:  Stereotactic Radiosurgery  SURGEON:  Eliyah Mcshea P, MD  NARRATIVE: The patient underwent a radiation treatment planning session in the radiation oncology simulation suite under the care of the radiation oncology physician and physicist.  I participated closely in the radiation treatment planning afterwards. The patient underwent planning CT which was fused to 3T high resolution MRI with 1 mm axial slices.  These images were fused on the planning system.  We contoured the gross target volumes and subsequently expanded this to yield the Planning Target Volume. I actively participated in the planning process.  I helped to define and review the target contours and also the contours of the optic pathway, eyes, brainstem and selected nearby organs at risk.  All the dose constraints for critical structures were reviewed and compared to AAPM Task Group 101.  The prescription dose conformity was reviewed.  I approved the plan electronically.    Accordingly, Park Breed was brought to the TrueBeam stereotactic radiation treatment linac and placed in the custom immobilization mask.  The patient was aligned according to the IR fiducial markers with BrainLab Exactrac, then orthogonal x-rays were used in ExacTrac with the 6DOF robotic table and the shifts were made to align the patient  Park Breed received stereotactic radiosurgery uneventfully.    The detailed description of the procedure is recorded in the radiation oncology procedure note.  I was present for the duration of the procedure.  DISPOSITION:  Following delivery, the patient was transported to nursing in stable condition and monitored for possible acute effects to be discharged to home in stable condition  with follow-up in one month.  Latana Colin P, MD 11/20/2015 9:43 AM

## 2015-11-24 ENCOUNTER — Inpatient Hospital Stay (HOSPITAL_COMMUNITY)
Admission: EM | Admit: 2015-11-24 | Discharge: 2015-11-27 | DRG: 375 | Disposition: A | Discharge status: Home / Self Care | Payer: Medicare Other | Attending: Internal Medicine | Admitting: Internal Medicine

## 2015-11-24 ENCOUNTER — Encounter (HOSPITAL_COMMUNITY): Payer: Self-pay | Admitting: Emergency Medicine

## 2015-11-24 DIAGNOSIS — Z885 Allergy status to narcotic agent status: Secondary | ICD-10-CM

## 2015-11-24 DIAGNOSIS — C787 Secondary malignant neoplasm of liver and intrahepatic bile duct: Secondary | ICD-10-CM | POA: Diagnosis present

## 2015-11-24 DIAGNOSIS — Z79899 Other long term (current) drug therapy: Secondary | ICD-10-CM

## 2015-11-24 DIAGNOSIS — M549 Dorsalgia, unspecified: Secondary | ICD-10-CM

## 2015-11-24 DIAGNOSIS — R1012 Left upper quadrant pain: Secondary | ICD-10-CM | POA: Diagnosis present

## 2015-11-24 DIAGNOSIS — Z800 Family history of malignant neoplasm of digestive organs: Secondary | ICD-10-CM

## 2015-11-24 DIAGNOSIS — C19 Malignant neoplasm of rectosigmoid junction: Principal | ICD-10-CM | POA: Diagnosis present

## 2015-11-24 DIAGNOSIS — R262 Difficulty in walking, not elsewhere classified: Secondary | ICD-10-CM | POA: Diagnosis present

## 2015-11-24 DIAGNOSIS — M79606 Pain in leg, unspecified: Secondary | ICD-10-CM | POA: Diagnosis present

## 2015-11-24 DIAGNOSIS — R109 Unspecified abdominal pain: Secondary | ICD-10-CM | POA: Diagnosis present

## 2015-11-24 DIAGNOSIS — Z90710 Acquired absence of both cervix and uterus: Secondary | ICD-10-CM

## 2015-11-24 DIAGNOSIS — Z87891 Personal history of nicotine dependence: Secondary | ICD-10-CM

## 2015-11-24 DIAGNOSIS — Z803 Family history of malignant neoplasm of breast: Secondary | ICD-10-CM

## 2015-11-24 DIAGNOSIS — Z7901 Long term (current) use of anticoagulants: Secondary | ICD-10-CM

## 2015-11-24 DIAGNOSIS — I2699 Other pulmonary embolism without acute cor pulmonale: Secondary | ICD-10-CM | POA: Diagnosis present

## 2015-11-24 DIAGNOSIS — I2782 Chronic pulmonary embolism: Secondary | ICD-10-CM | POA: Diagnosis present

## 2015-11-24 DIAGNOSIS — D649 Anemia, unspecified: Secondary | ICD-10-CM

## 2015-11-24 DIAGNOSIS — Z808 Family history of malignant neoplasm of other organs or systems: Secondary | ICD-10-CM

## 2015-11-24 DIAGNOSIS — C189 Malignant neoplasm of colon, unspecified: Secondary | ICD-10-CM | POA: Diagnosis present

## 2015-11-24 DIAGNOSIS — Z7952 Long term (current) use of systemic steroids: Secondary | ICD-10-CM

## 2015-11-24 DIAGNOSIS — C7931 Secondary malignant neoplasm of brain: Secondary | ICD-10-CM | POA: Diagnosis present

## 2015-11-24 DIAGNOSIS — M62838 Other muscle spasm: Secondary | ICD-10-CM | POA: Insufficient documentation

## 2015-11-24 LAB — COMPREHENSIVE METABOLIC PANEL
ALT: 17 U/L (ref 14–54)
ANION GAP: 8 (ref 5–15)
AST: 30 U/L (ref 15–41)
Albumin: 3.3 g/dL — ABNORMAL LOW (ref 3.5–5.0)
Alkaline Phosphatase: 59 U/L (ref 38–126)
BILIRUBIN TOTAL: 0.8 mg/dL (ref 0.3–1.2)
BUN: 18 mg/dL (ref 6–20)
CO2: 22 mmol/L (ref 22–32)
Calcium: 12.1 mg/dL — ABNORMAL HIGH (ref 8.9–10.3)
Chloride: 107 mmol/L (ref 101–111)
Creatinine, Ser: 0.66 mg/dL (ref 0.44–1.00)
Glucose, Bld: 124 mg/dL — ABNORMAL HIGH (ref 65–99)
POTASSIUM: 4.4 mmol/L (ref 3.5–5.1)
Sodium: 137 mmol/L (ref 135–145)
TOTAL PROTEIN: 6.9 g/dL (ref 6.5–8.1)

## 2015-11-24 LAB — CBC WITH DIFFERENTIAL/PLATELET
Basophils Absolute: 0 10*3/uL (ref 0.0–0.1)
Basophils Relative: 0 %
EOS PCT: 6 %
Eosinophils Absolute: 0.6 10*3/uL (ref 0.0–0.7)
HCT: 25.1 % — ABNORMAL LOW (ref 36.0–46.0)
Hemoglobin: 8.1 g/dL — ABNORMAL LOW (ref 12.0–15.0)
LYMPHS ABS: 1.6 10*3/uL (ref 0.7–4.0)
LYMPHS PCT: 15 %
MCH: 27.1 pg (ref 26.0–34.0)
MCHC: 32.3 g/dL (ref 30.0–36.0)
MCV: 83.9 fL (ref 78.0–100.0)
MONOS PCT: 6 %
Monocytes Absolute: 0.7 10*3/uL (ref 0.1–1.0)
Neutro Abs: 8 10*3/uL — ABNORMAL HIGH (ref 1.7–7.7)
Neutrophils Relative %: 73 %
PLATELETS: 460 10*3/uL — AB (ref 150–400)
RBC: 2.99 MIL/uL — AB (ref 3.87–5.11)
RDW: 16.2 % — ABNORMAL HIGH (ref 11.5–15.5)
WBC: 11 10*3/uL — AB (ref 4.0–10.5)

## 2015-11-24 LAB — LIPASE, BLOOD: LIPASE: 44 U/L (ref 11–51)

## 2015-11-24 MED ORDER — ONDANSETRON HCL 4 MG/2ML IJ SOLN
4.0000 mg | Freq: Once | INTRAMUSCULAR | Status: DC
  Filled 2015-11-24: qty 2

## 2015-11-24 MED ORDER — HYDROMORPHONE HCL 1 MG/ML IJ SOLN
1.0000 mg | Freq: Once | INTRAMUSCULAR | Status: DC
  Filled 2015-11-24: qty 1

## 2015-11-24 MED ORDER — SODIUM CHLORIDE 0.9 % IV BOLUS (SEPSIS)
1000.0000 mL | Freq: Once | INTRAVENOUS | Status: AC
  Administered 2015-11-24: 1000 mL via INTRAVENOUS

## 2015-11-24 MED ORDER — HYDROMORPHONE HCL 1 MG/ML IJ SOLN
1.0000 mg | Freq: Once | INTRAMUSCULAR | Status: AC
  Administered 2015-11-25: 1 mg via INTRAVENOUS
  Filled 2015-11-24: qty 1

## 2015-11-24 MED ORDER — HYDROMORPHONE HCL 1 MG/ML IJ SOLN
1.0000 mg | Freq: Once | INTRAMUSCULAR | Status: AC
  Administered 2015-11-24: 1 mg via INTRAMUSCULAR

## 2015-11-24 NOTE — ED Notes (Signed)
Pt brought in by [facility] with c/o right leg pain  Pt states she has hx of colon cancer and has had abd cramps today that are worse than normal and then started having cramping, spasms in her right leg that started about 7pm tonight  Pt states last time she had pain like that they found a tumor in her head and she had to have radiation treatment x 1 for that  Pt states she took a hydrocodone earlier for the abd pain  Pt crying and yelling out in triage

## 2015-11-24 NOTE — ED Notes (Addendum)
Pt from home states she worked out with her rehab team and worked her right leg extra hard and is now having spasms in that leg

## 2015-11-24 NOTE — ED Provider Notes (Signed)
CSN: [id number]     Arrival date & time 11/24/15  2056 History   First MD Initiated Contact with Patient 11/24/15 2151     Chief Complaint  Patient presents with  . Leg Pain     (Consider location/radiation/quality/duration/timing/severity/associated sxs/prior Treatment) HPI  Started having abdominal cramps today, nausea, took 1000mg  tylenol, then took vicodin around 7pm or 8pm.  Leg started jumping, having spasms, never had that before.  Continued having spasms.  Severe pain.  Left sided abdominal cramping,.  Nothing helping the pain.  Abdominal pain Pain has been present but getting worse today.  No diarrhea or constipation. No urinary symptoms.  Just came off of prednisone on Saturday.    During exam, pt reports severe RLE pain, begins shaking RLE with non-rhythmic movements and shouting in pain.  Past Medical History  Diagnosis Date  . Colon cancer ([place])     dx'd 2014  . Heart murmur     ? as a child   . Pulmonary embolism ([place]) 11/2014   . Anemia     hx of    Past Surgical History  Procedure Laterality Date  . Abdominal hysterectomy  2005  . Cesarean section  1976  . Flexible sigmoidoscopy N/A 05/25/2013    Procedure: FLEXIBLE SIGMOIDOSCOPY;  Surgeon: [name], MD;  Location: [facility];  Service: Endoscopy;  Laterality: N/A;  needs floro  . Colonic stent placement N/A 05/25/2013    Procedure: COLONIC STENT PLACEMENT;  Surgeon: [name], MD;  Location: [facility];  Service: Endoscopy;  Laterality: N/A;  . Portacath placement Right 06/25/2013    Procedure: ULTRA SOUND GUIDED INSERTION PORT-A-CATH;  Surgeon: [name], MD;  Location: [place];  Service: General;  Laterality: Right;  . Flexible sigmoidoscopy N/A 08/19/2014    Procedure: FLEXIBLE SIGMOIDOSCOPY;  Surgeon: Gatha Mayer, MD;  Location: [facility];  Service: Endoscopy;  Laterality: N/A;  . Colonic stent placement N/A 08/19/2014    Procedure: COLONIC STENT PLACEMENT;   Surgeon: Gatha Mayer, MD;  Location: [facility];  Service: Endoscopy;  Laterality: N/A;  . Flexible sigmoidoscopy N/A 08/19/2014    Procedure: FLEXIBLE SIGMOIDOSCOPY;  Surgeon: Gatha Mayer, MD;  Location: [facility];  Service: Endoscopy;  Laterality: N/A;  . Colonic stent placement N/A 08/19/2014    Procedure: COLONIC STENT PLACEMENT;  Surgeon: Gatha Mayer, MD;  Location: [facility];  Service: Endoscopy;  Laterality: N/A;  . Colon resection N/A 10/28/2014    Procedure: Transverse loop colostomy;  Surgeon: [name], MD;  Location: [facility];  Service: General;  Laterality: N/A;  . Colostomy revision N/A 01/03/2015    Procedure: REVISION OF TRANSVERSE LOOP COLOSTOMY WITH PARTIAL  COLECTOMY;  Surgeon: [name], MD;  Location: [facility];  Service: General;  Laterality: N/A;  . Laparoscopic low anterior resection  03/06/2015    03/06/2015  . Laparoscopic splenectomy  03/06/2015  . Colon resection N/A 03/06/2015    Procedure: LAPAROSCOPIC takedown loop colostomy ;  Surgeon: Michael Boston, MD;  Location: [facility];  Service: General;  Laterality: N/A;  . 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 Bowel resection  03/06/2015    Procedure: LOW ANTERIOR BOWEL RESECTION, rigid proctoscopy;  Surgeon: Michael Boston, MD;  Location: [facility];  Service: General;;  . Polypectomy  03/06/2015    Procedure: transverse POLYPECTOMY x 4;  Surgeon: Michael Boston, MD;  Location: [facility];  Service: General;;   Family History  Problem Relation Age of Onset  . Colon cancer Sister 47  . Diabetes Neg Hx   . Thyroid disease Neg Hx   . Breast cancer Maternal Aunt   . Prostate cancer Maternal Uncle   . Bone cancer Maternal Grandfather   . Prostate cancer Maternal Uncle    Social History  Substance Use Topics  . Smoking status: Former Smoker -- 1 years    Types: Cigarettes    Quit date: 05/24/1971  . Smokeless  tobacco: Never Used  . Alcohol Use: No   OB History    No data available     Review of Systems  Constitutional: Negative for fever.  HENT: Negative for sore throat.   Eyes: Negative for visual disturbance.  Respiratory: Negative for cough and shortness of breath.   Cardiovascular: Negative for chest pain.  Gastrointestinal: Positive for nausea and abdominal pain. Negative for vomiting, diarrhea and constipation.  Genitourinary: Negative for dysuria and difficulty urinating.  Musculoskeletal: Positive for back pain. Negative for neck pain.  Skin: Negative for rash.  Neurological: Negative for syncope, weakness, numbness and headaches.      Allergies  Oxycontin and Codeine  Home Medications   Prior to Admission medications   Medication Sig Start Date End Date Taking? Authorizing Provider  acetaminophen (TYLENOL) 500 MG tablet Take 500 mg by mouth every 6 (six) hours as needed for moderate pain or headache.    Yes Historical Provider, MD  Cyanocobalamin (VITAMIN B 12 PO) Take 1 tablet by mouth every morning.   Yes Historical Provider, MD  HYDROcodone-acetaminophen (NORCO/VICODIN) 5-325 MG tablet Take 1-2 tablets by mouth every 6 (six) hours as needed for moderate pain. 10/28/15  Yes Domenic Polite, MD  IRON PO Take 1 tablet by mouth every morning.   Yes Historical Provider, MD  pantoprazole (PROTONIX) 40 MG tablet Take 1 tablet by mouth daily. 10/01/15  Yes Historical Provider, MD  polyethylene glycol powder (GLYCOLAX/MIRALAX) powder TAKE 34 G (2 DOSES) BY MOUTH DAILY AS NEEDED FOR MILD CONSTIPATION OR MODERATE CONSTIPATION 03/03/14  Yes Concha Norway, MD  rivaroxaban (XARELTO) 20 MG TABS tablet Take 1 tablet (20 mg total) by mouth daily with supper. 11/11/15  Yes Truitt Merle, MD  dexamethasone (DECADRON) 2 MG tablet Take 2 tablets (4 mg total) by mouth 3 (three) times daily. 10/28/15   Domenic Polite, MD   BP 121/77 mmHg  Pulse 85  Temp(Src) 98.9 F (37.2 C) (Oral)  Resp 16  SpO2  100% Physical Exam  Constitutional: She is oriented to person, place, and time. She appears well-developed and well-nourished. She appears distressed.  HENT:  Head: Normocephalic and atraumatic.  Eyes: Conjunctivae and EOM are normal.  Neck: Normal range of motion.  Cardiovascular: Normal rate, regular rhythm, normal heart sounds and intact distal pulses.  Exam reveals no gallop and no friction rub.   No murmur heard. Pulmonary/Chest: Effort normal and breath sounds normal. No respiratory distress. She has no wheezes. She has no rales.  Abdominal: Soft. She exhibits no distension. There is tenderness in the left upper quadrant and left lower quadrant. There is guarding. There is no rebound.  Musculoskeletal: She exhibits no edema or tenderness.  Neurological: She is alert and oriented to person, place, and time. No cranial nerve deficit. GCS eye subscore is 4. GCS verbal subscore is 5. GCS motor subscore is 6.  Strength right lower ext  Skin: Skin is warm and dry. No rash  noted. She is not diaphoretic (pain). No erythema.  Nursing note and vitals reviewed.   ED Course  Procedures (including critical care time) Labs Review Labs Reviewed  CBC WITH DIFFERENTIAL/PLATELET - Abnormal; Notable for the following:    WBC 11.0 (*)    RBC 2.99 (*)    Hemoglobin 8.1 (*)    HCT 25.1 (*)    RDW 16.2 (*)    Platelets 460 (*)    Neutro Abs 8.0 (*)    All other components within normal limits  COMPREHENSIVE METABOLIC PANEL - Abnormal; Notable for the following:    Glucose, Bld 124 (*)    Calcium 12.1 (*)    Albumin 3.3 (*)    All other components within normal limits  URINALYSIS, ROUTINE W REFLEX MICROSCOPIC (NOT AT [facility]) - Abnormal; Notable for the following:    Hgb urine dipstick TRACE (*)    All other components within normal limits  URINE MICROSCOPIC-ADD ON - Abnormal; Notable for the following:    Squamous Epithelial / LPF 0-5 (*)    Bacteria, UA RARE (*)    All other components within  normal limits  URINE CULTURE  LIPASE, BLOOD  POC OCCULT BLOOD, ED  TYPE AND SCREEN    Imaging Review No results found. I have personally reviewed and evaluated these images and lab results as part of my medical decision-making.   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  Patient with history of right-sided weakness and ataxia secondary to her solitary brain metastases, however episode of spasm and cramping witnessed, and does not appear consistent with focal seizure, is not rhythmic. Her strength appears to be normal on exam and pulses WNL with no edema and doubt acute arterial, or venous obstruction.  Hemoglobin decreased to 8 over the last month. Patient does report 1 episode of bright red blood per rectum last night, however reports normal stool today. On rectal exam, however patient was noted to have stool with dark blood mixed in which was hemoccult positive. Do not feel pt needs emergent GI consult this evening, however feel that given pt on xarelto with drop in hgb and recent report of BRBPR, pt would benefit from observation and serial hgb.  In addition, pt reports severe pain and requires admission for pain control.  Currently awaiting results of CT abdomen, however called Dr. Hal Hope and discussed admission. CT head ordered to evaluate metastases and one order of decadron given.   Gareth Morgan, MD 11/25/15 [phone]

## 2015-11-25 ENCOUNTER — Telehealth: Payer: Self-pay | Admitting: *Deleted

## 2015-11-25 ENCOUNTER — Emergency Department (HOSPITAL_COMMUNITY): Payer: Medicare Other

## 2015-11-25 ENCOUNTER — Encounter (HOSPITAL_COMMUNITY): Payer: Self-pay

## 2015-11-25 ENCOUNTER — Observation Stay (HOSPITAL_COMMUNITY): Payer: Medicare Other

## 2015-11-25 DIAGNOSIS — D630 Anemia in neoplastic disease: Secondary | ICD-10-CM

## 2015-11-25 DIAGNOSIS — R252 Cramp and spasm: Secondary | ICD-10-CM

## 2015-11-25 DIAGNOSIS — M62838 Other muscle spasm: Secondary | ICD-10-CM

## 2015-11-25 DIAGNOSIS — C7931 Secondary malignant neoplasm of brain: Secondary | ICD-10-CM

## 2015-11-25 DIAGNOSIS — M79606 Pain in leg, unspecified: Secondary | ICD-10-CM | POA: Diagnosis not present

## 2015-11-25 DIAGNOSIS — C189 Malignant neoplasm of colon, unspecified: Secondary | ICD-10-CM | POA: Diagnosis not present

## 2015-11-25 DIAGNOSIS — R109 Unspecified abdominal pain: Secondary | ICD-10-CM | POA: Diagnosis present

## 2015-11-25 DIAGNOSIS — Z86711 Personal history of pulmonary embolism: Secondary | ICD-10-CM

## 2015-11-25 LAB — CBC WITH DIFFERENTIAL/PLATELET
BASOS PCT: 0 %
Basophils Absolute: 0 10*3/uL (ref 0.0–0.1)
EOS ABS: 0 10*3/uL (ref 0.0–0.7)
Eosinophils Relative: 0 %
HEMATOCRIT: 26.6 % — AB (ref 36.0–46.0)
HEMOGLOBIN: 8.6 g/dL — AB (ref 12.0–15.0)
LYMPHS ABS: 0.6 10*3/uL — AB (ref 0.7–4.0)
Lymphocytes Relative: 6 %
MCH: 27.7 pg (ref 26.0–34.0)
MCHC: 32.3 g/dL (ref 30.0–36.0)
MCV: 85.8 fL (ref 78.0–100.0)
MONO ABS: 0.1 10*3/uL (ref 0.1–1.0)
MONOS PCT: 1 %
NEUTROS PCT: 93 %
Neutro Abs: 9.3 10*3/uL — ABNORMAL HIGH (ref 1.7–7.7)
Platelets: 498 10*3/uL — ABNORMAL HIGH (ref 150–400)
RBC: 3.1 MIL/uL — ABNORMAL LOW (ref 3.87–5.11)
RDW: 16.6 % — AB (ref 11.5–15.5)
WBC: 10 10*3/uL (ref 4.0–10.5)

## 2015-11-25 LAB — URINALYSIS, ROUTINE W REFLEX MICROSCOPIC
BILIRUBIN URINE: NEGATIVE
Glucose, UA: NEGATIVE mg/dL
KETONES UR: NEGATIVE mg/dL
Leukocytes, UA: NEGATIVE
NITRITE: NEGATIVE
PROTEIN: NEGATIVE mg/dL
Specific Gravity, Urine: 1.012 (ref 1.005–1.030)
pH: 7 (ref 5.0–8.0)

## 2015-11-25 LAB — TYPE AND SCREEN
ABO/RH(D): AB POS
Antibody Screen: NEGATIVE

## 2015-11-25 LAB — BASIC METABOLIC PANEL
Anion gap: 7 (ref 5–15)
BUN: 13 mg/dL (ref 6–20)
CALCIUM: 11.7 mg/dL — AB (ref 8.9–10.3)
CHLORIDE: 106 mmol/L (ref 101–111)
CO2: 22 mmol/L (ref 22–32)
CREATININE: 0.51 mg/dL (ref 0.44–1.00)
GFR calc non Af Amer: >60 mL/min (ref >60)
GLUCOSE: 151 mg/dL — AB (ref 65–99)
Potassium: 4.3 mmol/L (ref 3.5–5.1)
Sodium: 135 mmol/L (ref 135–145)

## 2015-11-25 LAB — HEPATIC FUNCTION PANEL
ALK PHOS: 65 U/L (ref 38–126)
ALT: 19 U/L (ref 14–54)
AST: 21 U/L (ref 15–41)
Albumin: 3.5 g/dL (ref 3.5–5.0)
BILIRUBIN TOTAL: 0.6 mg/dL (ref 0.3–1.2)
Bilirubin, Direct: <0.1 mg/dL — ABNORMAL LOW (ref 0.1–0.5)
TOTAL PROTEIN: 7.5 g/dL (ref 6.5–8.1)

## 2015-11-25 LAB — URINE MICROSCOPIC-ADD ON

## 2015-11-25 MED ORDER — DEXAMETHASONE SODIUM PHOSPHATE 4 MG/ML IJ SOLN
4.0000 mg | Freq: Two times a day (BID) | INTRAMUSCULAR | Status: DC
  Administered 2015-11-25 – 2015-11-26 (×4): 4 mg via INTRAVENOUS
  Filled 2015-11-25 (×6): qty 1

## 2015-11-25 MED ORDER — GADOBENATE DIMEGLUMINE 529 MG/ML IV SOLN
13.0000 mL | Freq: Once | INTRAVENOUS | Status: AC | PRN
  Administered 2015-11-25: 13 mL via INTRAVENOUS

## 2015-11-25 MED ORDER — POLYETHYLENE GLYCOL 3350 17 G PO PACK
17.0000 g | PACK | Freq: Every day | ORAL | Status: DC | PRN
  Administered 2015-11-25: 17 g via ORAL
  Filled 2015-11-25: qty 1

## 2015-11-25 MED ORDER — ONDANSETRON HCL 4 MG/2ML IJ SOLN: 4.0000 mg | Freq: Four times a day (QID) | INTRAMUSCULAR | Status: DC | PRN

## 2015-11-25 MED ORDER — PANTOPRAZOLE SODIUM 40 MG PO TBEC: 40.0000 mg | DELAYED_RELEASE_TABLET | Freq: Every day | ORAL | Status: DC

## 2015-11-25 MED ORDER — HYDROMORPHONE HCL 1 MG/ML IJ SOLN
1.0000 mg | INTRAMUSCULAR | Status: DC | PRN
  Administered 2015-11-25 – 2015-11-26 (×7): 1 mg via INTRAVENOUS
  Filled 2015-11-25 (×6): qty 1

## 2015-11-25 MED ORDER — RIVAROXABAN 20 MG PO TABS
20.0000 mg | ORAL_TABLET | Freq: Every day | ORAL | Status: DC
  Administered 2015-11-25 – 2015-11-26 (×2): 20 mg via ORAL
  Filled 2015-11-25 (×3): qty 1

## 2015-11-25 MED ORDER — ACETAMINOPHEN 325 MG PO TABS
650.0000 mg | ORAL_TABLET | Freq: Four times a day (QID) | ORAL | Status: DC | PRN
  Administered 2015-11-25 – 2015-11-27 (×4): 650 mg via ORAL
  Filled 2015-11-25 (×4): qty 2

## 2015-11-25 MED ORDER — DIATRIZOATE MEGLUMINE & SODIUM 66-10 % PO SOLN
15.0000 mL | Freq: Once | ORAL | Status: AC
  Administered 2015-11-25: 15 mL via ORAL

## 2015-11-25 MED ORDER — DEXAMETHASONE SODIUM PHOSPHATE 10 MG/ML IJ SOLN
10.0000 mg | Freq: Once | INTRAMUSCULAR | Status: AC
Start: 2015-11-25 — End: 2015-11-25
  Administered 2015-11-25: 10 mg via INTRAVENOUS
  Filled 2015-11-25: qty 1

## 2015-11-25 MED ORDER — PANTOPRAZOLE SODIUM 40 MG IV SOLR
40.0000 mg | INTRAVENOUS | Status: DC
  Administered 2015-11-25 – 2015-11-26 (×2): 40 mg via INTRAVENOUS
  Filled 2015-11-25 (×3): qty 40

## 2015-11-25 MED ORDER — SODIUM CHLORIDE 0.9 % IV SOLN
INTRAVENOUS | Status: AC
  Administered 2015-11-25 – 2015-11-26 (×2): via INTRAVENOUS

## 2015-11-25 MED ORDER — HYDROMORPHONE HCL 1 MG/ML IJ SOLN
1.0000 mg | INTRAMUSCULAR | Status: DC | PRN
  Administered 2015-11-25: 1 mg via INTRAVENOUS
  Filled 2015-11-25: qty 1

## 2015-11-25 MED ORDER — HYDROMORPHONE HCL 1 MG/ML IJ SOLN
1.0000 mg | Freq: Once | INTRAMUSCULAR | Status: AC
  Administered 2015-11-25: 1 mg via INTRAVENOUS
  Filled 2015-11-25: qty 1

## 2015-11-25 MED ORDER — IOPAMIDOL (ISOVUE-300) INJECTION 61%
100.0000 mL | Freq: Once | INTRAVENOUS | Status: AC | PRN
  Administered 2015-11-25: 100 mL via INTRAVENOUS

## 2015-11-25 MED ORDER — ONDANSETRON HCL 4 MG PO TABS: 4.0000 mg | ORAL_TABLET | Freq: Four times a day (QID) | ORAL | Status: DC | PRN

## 2015-11-25 MED ORDER — ACETAMINOPHEN 650 MG RE SUPP: 650.0000 mg | Freq: Four times a day (QID) | RECTAL | Status: DC | PRN

## 2015-11-25 MED ORDER — POLYETHYLENE GLYCOL 3350 17 GM/SCOOP PO POWD: 1.0000 | Freq: Every day | ORAL | Status: DC | PRN

## 2015-11-25 NOTE — Progress Notes (Signed)
[facility]  Patient Status: Active (receiving services up to time of hospitalization)  [facility] is providing the following services: RN and PT  If patient discharges after hours, please call [phone].   [name] [name] 11/25/2015, 12:47 PM

## 2015-11-25 NOTE — Telephone Encounter (Signed)
:  Could you let Dr. Burr Medico know I was admitted to the hospital early this morning.  [place] room 1504-01.  The staff said they would let her know, I want to make sure because I really need to see her."

## 2015-11-25 NOTE — Progress Notes (Signed)
I met briefly with [name] [name] this afternoon.  She reports that her pain in currently much better controlled.  She states that she spoke with Dr. Burr Medico today and needs time to think prior to further conversations.  She requested to hold on further conversations this afternoon.  She is agreeable to my following up tomorrow.  [name] Rough, MD [place] Team [phone]

## 2015-11-25 NOTE — Progress Notes (Signed)
[name] [name]   DOB:07-25-1950   [id number]   [id number]  Subjective: Patient is well-known to me, was admitted last night for right leg spasm and abdominal pain. She came off steroids last Saturday, and her right leg spasm started 2 days ago.   Objective:  Filed Vitals:   11/25/15 0931 11/25/15 1326  BP: 141/74 117/59  Pulse: 112 103  Temp: 98.4 F (36.9 C)   Resp: 16 18    Body mass index is 24.51 kg/(m^2). No intake or output data in the 24 hours ending 11/25/15 1812   Sclerae unicteric  Oropharynx clear  No peripheral adenopathy  Lungs clear -- no rales or rhonchi  Heart regular rate and rhythm  Abdomen benign  MSK no focal spinal tenderness, no peripheral edema  Neuro (+) right side weakness    CBG (last 3)  No results for input(s): GLUCAP in the last 72 hours.   Labs:  Lab Results  Component Value Date   WBC 10.0 11/25/2015   HGB 8.6* 11/25/2015   HCT 26.6* 11/25/2015   MCV 85.8 11/25/2015   PLT 498* 11/25/2015   NEUTROABS 9.3* 11/25/2015    @LASTCHEMISTRY @  Urine Studies No results for input(s): UHGB, CRYS in the last 72 hours.  Invalid input(s): UACOL, UAPR, USPG, UPH, UTP, UGL, UKET, UBIL, UNIT, UROB, ULEU, UEPI, UWBC, URBC, UBAC, CAST, UCOM, BILUA  Basic Metabolic Panel:  Recent Labs Lab 11/24/15 2256 11/25/15 0906  NA 137 135  K 4.4 4.3  CL 107 106  CO2 22 22  GLUCOSE 124* 151*  BUN 18 13  CREATININE 0.66 0.51  CALCIUM 12.1* 11.7*   GFR Estimated Creatinine Clearance: 64.8 mL/min (by C-G formula based on Cr of 0.51). Liver Function Tests:  Recent Labs Lab 11/24/15 2256 11/25/15 0906  AST 30 21  ALT 17 19  ALKPHOS 59 65  BILITOT 0.8 0.6  PROT 6.9 7.5  ALBUMIN 3.3* 3.5    Recent Labs Lab 11/24/15 2256  LIPASE 44   No results for input(s): AMMONIA in the last 168 hours. Coagulation profile No results for input(s): INR, PROTIME in the last 168 hours.  CBC:  Recent Labs Lab 11/24/15 2256 11/25/15 0906  WBC  11.0* 10.0  NEUTROABS 8.0* 9.3*  HGB 8.1* 8.6*  HCT 25.1* 26.6*  MCV 83.9 85.8  PLT 460* 498*   Cardiac Enzymes: No results for input(s): CKTOTAL, CKMB, CKMBINDEX, TROPONINI in the last 168 hours. BNP: Invalid input(s): POCBNP CBG: No results for input(s): GLUCAP in the last 168 hours. D-Dimer No results for input(s): DDIMER in the last 72 hours. Hgb A1c No results for input(s): HGBA1C in the last 72 hours. Lipid Profile No results for input(s): CHOL, HDL, LDLCALC, TRIG, CHOLHDL, LDLDIRECT in the last 72 hours. Thyroid function studies No results for input(s): TSH, T4TOTAL, T3FREE, THYROIDAB in the last 72 hours.  Invalid input(s): FREET3 Anemia work up No results for input(s): VITAMINB12, FOLATE, FERRITIN, TIBC, IRON, RETICCTPCT in the last 72 hours. Microbiology No results found for this or any previous visit (from the past 240 hour(s)).    Studies:  Ct Head Wo Contrast  11/25/2015  CLINICAL DATA:  Evaluate known brain metastases. Acute onset of leg pain and spasms. Initial encounter. EXAM: CT HEAD WITHOUT CONTRAST TECHNIQUE: Contiguous axial images were obtained from the base of the skull through the vertex without intravenous contrast. COMPARISON:  CT of the head performed 10/25/2015 FINDINGS: The known metastatic lesion at the left convexity is more prominent than on  the prior study, with persistent surrounding decreased attenuation. The visualized mass measures approximately 2.1 cm. No definite additional masses are identified on CT. No acute infarction or hemorrhage is seen. The posterior fossa, including the cerebellum, brainstem and fourth ventricle, is within normal limits. The third and lateral ventricles, and basal ganglia are unremarkable in appearance. There is no evidence of fracture; visualized osseous structures are unremarkable in appearance. The orbits are within normal limits. The paranasal sinuses and mastoid air cells are well-aerated. No significant soft tissue  abnormalities are seen. IMPRESSION: 1. Known metastatic lesion at the left convexity is more prominent, measuring 2.1 cm, with persistent surrounding decreased attenuation. 2. No definite additional masses seen on noncontrast CT, though MRI with contrast would be far more sensitive for metastatic lesions. Electronically Signed   By: Garald Balding M.D.   On: 11/25/2015 04:57   Mr Thoracic Spine W Wo Contrast  11/25/2015  CLINICAL DATA:  Back pain.  Metastatic colon cancer. EXAM: MRI THORACIC AND LUMBAR SPINE WITHOUT AND WITH CONTRAST TECHNIQUE: Multiplanar and multiecho pulse sequences of the thoracic and lumbar spine were obtained without and with intravenous contrast. CONTRAST:  33mL MULTIHANCE GADOBENATE DIMEGLUMINE 529 MG/ML IV SOLN COMPARISON:  MRI cervical and thoracic spine 09/13/2015 FINDINGS: MRI THORACIC SPINE FINDINGS Alignment:  Normal.  Mild levoscoliosis Vertebrae: Negative for fracture or metastatic disease. Bone marrow signal normal. No enhancing lesions postcontrast administration Cord: Normal signal and morphology of the spinal cord. No cord compression. Paraspinal and other soft tissues: Negative Disc levels: Mild thoracic disc degeneration. Small left-sided disc protrusion T8-9. Negative for spinal stenosis. MRI LUMBAR SPINE FINDINGS Segmentation:  S1 partially lumbarized.  Small disc space at S1-S2. Alignment:  Normal alignment Vertebrae: Negative for fracture. No bone marrow lesion identified. Negative for metastatic disease in the lumbar spine. Conus medullaris: Extends to the mid L2 level and appears normal. Paraspinal and other soft tissues: Soft tissue anterior to the S2 vertebral body most consistent with tumor. This enhances. This appears to represent tumor on the prior CT abdomen pelvis 11/25/2015. This is touching the vertebral body without definite invasion of the bone marrow. Multiple hepatic metastatic lesions are noted Disc levels: L1-2:  Negative L2-3:  Mild disc and mild facet  degeneration without spinal stenosis L3-4: Mild disc bulging and mild facet degeneration without spinal stenosis L4-5: Disc degeneration with disc bulging and mild spurring. Moderate facet hypertrophy. Mild narrowing of the spinal canal. Mild right foraminal narrowing L5-S1: Moderate disc degeneration with disc space narrowing and diffuse endplate spurring. No significant spinal or foraminal stenosis. IMPRESSION: Negative for metastatic disease in the thoracic spine. Negative for metastatic disease in the lumbar spine. Chronic lumbar degenerative changes without acute disc protrusion Abnormal soft tissue in the presacral space at S2 compatible with tumor as noted on prior CT. Multiple liver metastasis. Electronically Signed   By: Franchot Gallo M.D.   On: 11/25/2015 17:19   Mr Lumbar Spine W Wo Contrast  11/25/2015  CLINICAL DATA:  Back pain.  Metastatic colon cancer. EXAM: MRI THORACIC AND LUMBAR SPINE WITHOUT AND WITH CONTRAST TECHNIQUE: Multiplanar and multiecho pulse sequences of the thoracic and lumbar spine were obtained without and with intravenous contrast. CONTRAST:  60mL MULTIHANCE GADOBENATE DIMEGLUMINE 529 MG/ML IV SOLN COMPARISON:  MRI cervical and thoracic spine 09/13/2015 FINDINGS: MRI THORACIC SPINE FINDINGS Alignment:  Normal.  Mild levoscoliosis Vertebrae: Negative for fracture or metastatic disease. Bone marrow signal normal. No enhancing lesions postcontrast administration Cord: Normal signal and morphology of the spinal  cord. No cord compression. Paraspinal and other soft tissues: Negative Disc levels: Mild thoracic disc degeneration. Small left-sided disc protrusion T8-9. Negative for spinal stenosis. MRI LUMBAR SPINE FINDINGS Segmentation:  S1 partially lumbarized.  Small disc space at S1-S2. Alignment:  Normal alignment Vertebrae: Negative for fracture. No bone marrow lesion identified. Negative for metastatic disease in the lumbar spine. Conus medullaris: Extends to the mid L2 level and  appears normal. Paraspinal and other soft tissues: Soft tissue anterior to the S2 vertebral body most consistent with tumor. This enhances. This appears to represent tumor on the prior CT abdomen pelvis 11/25/2015. This is touching the vertebral body without definite invasion of the bone marrow. Multiple hepatic metastatic lesions are noted Disc levels: L1-2:  Negative L2-3:  Mild disc and mild facet degeneration without spinal stenosis L3-4: Mild disc bulging and mild facet degeneration without spinal stenosis L4-5: Disc degeneration with disc bulging and mild spurring. Moderate facet hypertrophy. Mild narrowing of the spinal canal. Mild right foraminal narrowing L5-S1: Moderate disc degeneration with disc space narrowing and diffuse endplate spurring. No significant spinal or foraminal stenosis. IMPRESSION: Negative for metastatic disease in the thoracic spine. Negative for metastatic disease in the lumbar spine. Chronic lumbar degenerative changes without acute disc protrusion Abnormal soft tissue in the presacral space at S2 compatible with tumor as noted on prior CT. Multiple liver metastasis. Electronically Signed   By: Franchot Gallo M.D.   On: 11/25/2015 17:19   Ct Abdomen Pelvis W Contrast  11/25/2015  CLINICAL DATA:  Acute onset of generalized abdominal pain and cramping. Leukocytosis. Initial encounter. EXAM: CT ABDOMEN AND PELVIS WITH CONTRAST TECHNIQUE: Multidetector CT imaging of the abdomen and pelvis was performed using the standard protocol following bolus administration of intravenous contrast. CONTRAST:  165mL ISOVUE-300 IOPAMIDOL (ISOVUE-300) INJECTION 61% COMPARISON:  CT of the abdomen and pelvis from 03/03/2015, and MRI of the lumbar spine performed 09/13/2015 FINDINGS: A few small nodules are noted at the left lung base, measuring up to 6 mm in size. Numerous hepatic lesions have increased significantly in size from the prior study, the largest of which demonstrates 6.0 cm, with central  calcification. This is compatible with worsening diffuse metastatic disease to the liver. The spleen has been resected since the prior study. The large 7.4 cm hemangioma at the right hepatic lobe is grossly stable in appearance. The gallbladder is within normal limits. The pancreas and adrenal glands are unremarkable. Nonobstructing left renal stones measure up to 9 mm in size. There is incomplete rotation of the right kidney. The kidneys are otherwise unremarkable. There is no evidence of hydronephrosis. No obstructing ureteral stones are seen. No perinephric stranding is appreciated. No free fluid is identified. The small bowel is unremarkable in appearance. The stomach is within normal limits. No acute vascular abnormalities are seen. The appendix is normal in caliber, without evidence of appendicitis. Postoperative change is noted about the proximal transverse colon. Postoperative change is also seen about the mid sigmoid colon, with apparent recurrent irregular mass measuring approximately 5.6 x 4.4 x 3.3 cm, surrounding soft tissue inflammation and presacral stranding, and some degree of dysmotility given stool more proximally. Additional mass extends superiorly from the mid sigmoid colon along the retroperitoneum on the left side, measuring up to 2.6 cm in size. The bladder is moderately distended and grossly unremarkable. Patient is status post hysterectomy. The recurrent mass extends adjacent to the vaginal cuff. No inguinal lymphadenopathy is seen. No acute osseous abnormalities are identified. IMPRESSION: 1. Recurrence of  malignancy at the site of surgery at the mid sigmoid colon, with irregular mass measuring approximately 5.6 x 4.4 x 3.3 cm, surrounding soft tissue inflammation and presacral stranding, and some degree of bowel dysmotility given stool more proximally. 2. Additional mass extends superiorly from the mid sigmoid colon along the retroperitoneum on the left side, measuring up 2.6 cm in size.  3. Numerous hepatic lesions have increased significantly in size, measuring up to 6.0 cm, with central calcification, compatible with worsening diffuse metastatic disease the liver. 4. Few small nodules at the left lung base, measuring up to 6 mm in size. These are new or more prominent than on the prior study. Metastatic disease is a concern. 5. 7.4 cm hemangioma at the right hepatic lobe is grossly stable. 6. Nonobstructing left renal stones measure up to 9 mm in size. Electronically Signed   By: Garald Balding M.D.   On: 11/25/2015 04:12    Assessment: 65 y.o.  1. Stage IV colon cancer, with disease progression  2. Right leg spasm, likely related to her brain mets and recent off steroids 3. Abdominal pain secondary to #1 4. Anemia in neoplastic disease  5. History of PE on Xarelto 6. Hypercalcemia   Plan:  -She has improved with steroids, continue dexamethasone at current dose 4mg  bid, she would like need dexa 4-8mg /day  indefinitely  -The patient has previously declined systemic chemotherapy, which remains the same at this point. -We discussed the goal of care is palliation. I reviewed the CT scan findings, which showed significant cancer prognosis. -I discussed hospice with her, which I think is appropriate at this stage. Her life expectancy is likely a few months, no more than 6 months, and she will likely develop more symptoms related to her cancer metastasis.  To better manage her symptoms at home, avoid future ED visit and hospitalization, I think hospice is very appropriate.  -She initially states she is still able to take care of herself at home, but agrees to think about hospice.  -I agree with pain management and supportive care.  -please repeat iron study to see if she needs iv iron, last iron study on 10/20/15 was normal.  -I would continue Xarelto if no further significant GI bleeding.    Truitt Merle, MD 11/25/2015  6:12 PM

## 2015-11-25 NOTE — Care Management Note (Signed)
Case Management Note  Patient Details  Name: [name] [name] MRN: [id number] Date of Birth: Jun 07, 1951  Subjective/Objective: 65 y/o f admitted w/abd pain. Hx: Colon Ca. From home.                   Action/Plan:d/c plan home.   Expected Discharge Date:   (unknown)               Expected Discharge Plan:  Home/Self Care  In-House Referral:     Discharge planning Services  CM Consult  Post Acute Care Choice:    Choice offered to:     DME Arranged:    DME Agency:     HH Arranged:    HH Agency:     Status of Service:  In process, will continue to follow  Medicare Important Message Given:    Date Medicare IM Given:    Medicare IM give by:    Date Additional Medicare IM Given:    Additional Medicare Important Message give by:     If discussed at [place] of Stay Meetings, dates discussed:    Additional Comments:  Dessa Phi, RN 11/25/2015, 3:22 PM

## 2015-11-25 NOTE — Progress Notes (Signed)
Patient seen and examined, vital stable, on prn pain meds, awaiting for mri, oncology and palliative care consulted.  Likely home with home hospice in 1-2 days

## 2015-11-25 NOTE — ED Notes (Signed)
Pt stated that POC occult blood had been performed by provider.

## 2015-11-25 NOTE — ED Notes (Signed)
Writer called main lab for lab draws, pt has an [place] IV

## 2015-11-25 NOTE — ED Notes (Signed)
Awaiting admission orders for pt.

## 2015-11-25 NOTE — H&P (Signed)
History and Physical    [name] [name] [id number] DOB: 05-22-1951 DOA: 11/24/2015  PCP: Kristine Garbe, MD  Patient coming from: Home.  Chief Complaint: Lower extremity spasms and pain and abdominal pain.  HPI: [name] [name] is a 65 y.o. 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 Patient states she was on Decadron until 3 days ago for the brain metastases which was slowly weaned off. Patient started having spasms of the lower extremities with difficulty walking and in the ER patient also had some mid back pain. Patient also had been having abdominal pain which has been worsening last few days mostly in the left upper quadrant but no nausea vomiting or diarrhea. Patient did notice some blood in the stools yesterday. CT abdomen and pelvis done in the ER shows recurrence of the cancer at the surgical site with multiple metastasis and intermittent changes. Patient was given 1 dose of Decadron in the ER following which patient spasms of lower extremity improved and at this time is resolved.  ED Course: IV steroids was given in the ER following which patient's lower extremity pain and spasm has improved.  Review of Systems: As per HPI otherwise 10 point review of systems negative.    Past Medical History  Diagnosis Date  . Colon cancer ([place])     dx'd 2014  . Heart murmur     ? as a child   . Pulmonary embolism ([place]) 11/2014   . Anemia     hx of     Past Surgical History  Procedure Laterality Date  . Abdominal hysterectomy  2005  . Cesarean section  1976  . Flexible sigmoidoscopy N/A 05/25/2013    Procedure: FLEXIBLE SIGMOIDOSCOPY;  Surgeon: [name], MD;  Location: [facility];  Service: Endoscopy;  Laterality: N/A;  needs floro  . Colonic stent placement N/A 05/25/2013    Procedure: COLONIC  STENT PLACEMENT;  Surgeon: [name], MD;  Location: [facility];  Service: Endoscopy;  Laterality: N/A;  . Portacath placement Right 06/25/2013    Procedure: ULTRA SOUND GUIDED INSERTION PORT-A-CATH;  Surgeon: [name], MD;  Location: [facility];  Service: General;  Laterality: Right;  . Flexible sigmoidoscopy N/A 08/19/2014    Procedure: FLEXIBLE SIGMOIDOSCOPY;  Surgeon: Gatha Mayer, MD;  Location: [facility];  Service: Endoscopy;  Laterality: N/A;  . Colonic stent placement N/A 08/19/2014    Procedure: COLONIC STENT PLACEMENT;  Surgeon: Gatha Mayer, MD;  Location: [facility];  Service: Endoscopy;  Laterality: N/A;  . Flexible sigmoidoscopy N/A 08/19/2014    Procedure: FLEXIBLE SIGMOIDOSCOPY;  Surgeon: Gatha Mayer, MD;  Location: [facility];  Service: Endoscopy;  Laterality: N/A;  . Colonic stent placement N/A 08/19/2014    Procedure: COLONIC STENT PLACEMENT;  Surgeon: Gatha Mayer, MD;  Location: [facility];  Service: Endoscopy;  Laterality: N/A;  . Colon resection N/A 10/28/2014    Procedure: Transverse loop colostomy;  Surgeon: [name], MD;  Location: [facility];  Service: General;  Laterality: N/A;  . Colostomy revision N/A 01/03/2015    Procedure: REVISION OF TRANSVERSE LOOP COLOSTOMY WITH PARTIAL  COLECTOMY;  Surgeon: [name], MD;  Location: [facility];  Service: General;  Laterality: N/A;  . Laparoscopic low anterior resection  03/06/2015    03/06/2015  . Laparoscopic splenectomy  03/06/2015  . Colon resection N/A 03/06/2015    Procedure: LAPAROSCOPIC takedown loop colostomy ;  Surgeon: Michael Boston, MD;  Location: [facility];  Service: General;  Laterality: N/A;  . 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 Bowel resection  03/06/2015    Procedure: LOW ANTERIOR BOWEL RESECTION, rigid proctoscopy;  Surgeon: Michael Boston, MD;  Location: [facility];   Service: General;;  . Polypectomy  03/06/2015    Procedure: transverse POLYPECTOMY x 4;  Surgeon: Michael Boston, MD;  Location: [facility];  Service: General;;     reports that she quit smoking about 44 years ago. Her smoking use included Cigarettes. She quit after 1 year of use. She has never used smokeless tobacco. She reports that she does not drink alcohol or use illicit drugs.  Allergies  Allergen Reactions  . Oxycontin [Oxycodone Hcl] Anaphylaxis, Hives and Itching  . Codeine Itching and Nausea And Vomiting    Family History  Problem Relation Age of Onset  . Colon cancer Sister 31  . Diabetes Neg Hx   . Thyroid disease Neg Hx   . Breast cancer Maternal Aunt   . Prostate cancer Maternal Uncle   . Bone cancer Maternal Grandfather   . Prostate cancer Maternal Uncle     Prior to Admission medications   Medication Sig Start Date End Date Taking? Authorizing Provider  acetaminophen (TYLENOL) 500 MG tablet Take 500 mg by mouth every 6 (six) hours as needed for moderate pain or headache.    Yes Historical Provider, MD  Cyanocobalamin (VITAMIN B 12 PO) Take 1 tablet by mouth every morning.   Yes Historical Provider, MD  HYDROcodone-acetaminophen (NORCO/VICODIN) 5-325 MG tablet Take 1-2 tablets by mouth every 6 (six) hours as needed for moderate pain. 10/28/15  Yes Domenic Polite, MD  IRON PO Take 1 tablet by mouth every morning.   Yes Historical Provider, MD  pantoprazole (PROTONIX) 40 MG tablet Take 1 tablet by mouth daily. 10/01/15  Yes Historical Provider, MD  polyethylene glycol powder (GLYCOLAX/MIRALAX) powder TAKE 34 G (2 DOSES) BY MOUTH DAILY AS NEEDED FOR MILD CONSTIPATION OR MODERATE CONSTIPATION 03/03/14  Yes Concha Norway, MD  rivaroxaban (XARELTO) 20 MG TABS tablet Take 1 tablet (20 mg total) by mouth daily with supper. 11/11/15  Yes Truitt Merle, MD  dexamethasone (DECADRON) 2 MG tablet Take 2 tablets (4 mg total) by mouth 3 (three) times daily. 10/28/15   Domenic Polite, MD    Physical  Exam: Filed Vitals:   11/25/15 0330 11/25/15 0436 11/25/15 0604 11/25/15 0639  BP: 120/71 112/64 128/55 118/63  Pulse: 101 94 98 96  Temp:      TempSrc:      Resp: 17 17 16 16   SpO2: 98% 97% 99% 98%      Constitutional: Not in distress. Filed Vitals:   11/25/15 0330 11/25/15 0436 11/25/15 0604 11/25/15 0639  BP: 120/71 112/64 128/55 118/63  Pulse: 101 94 98 96  Temp:      TempSrc:      Resp: 17 17 16 16   SpO2: 98% 97% 99% 98%   Eyes: Anicteric no pallor. ENMT: No discharge from the ears eyes nose or mouth. Neck: No mass felt. No JVD appreciated. Respiratory: No rhonchi or crepitations. Cardiovascular: S1 and S2 heard. Abdomen: Soft nontender bowel sounds present. Mild tenderness in left upper quadrant. Musculoskeletal: No edema. Skin: No rash. Neurologic: Alert  awake oriented to time place and person. Moves all extremities. No hyperreflexia. Psychiatric: Appears normal.   Labs on Admission: I have personally reviewed following labs and imaging studies  CBC:  Recent Labs Lab 11/24/15 2256  WBC 11.0*  NEUTROABS 8.0*  HGB 8.1*  HCT 25.1*  MCV 83.9  PLT 123456*   Basic Metabolic Panel:  Recent Labs Lab 11/24/15 2256  NA 137  K 4.4  CL 107  CO2 22  GLUCOSE 124*  BUN 18  CREATININE 0.66  CALCIUM 12.1*   GFR: CrCl cannot be calculated (Unknown ideal weight.). Liver Function Tests:  Recent Labs Lab 11/24/15 2256  AST 30  ALT 17  ALKPHOS 59  BILITOT 0.8  PROT 6.9  ALBUMIN 3.3*    Recent Labs Lab 11/24/15 2256  LIPASE 44   No results for input(s): AMMONIA in the last 168 hours. Coagulation Profile: No results for input(s): INR, PROTIME in the last 168 hours. Cardiac Enzymes: No results for input(s): CKTOTAL, CKMB, CKMBINDEX, TROPONINI in the last 168 hours. BNP (last 3 results) No results for input(s): PROBNP in the last 8760 hours. HbA1C: No results for input(s): HGBA1C in the last 72 hours. CBG: No results for input(s): GLUCAP in the  last 168 hours. Lipid Profile: No results for input(s): CHOL, HDL, LDLCALC, TRIG, CHOLHDL, LDLDIRECT in the last 72 hours. Thyroid Function Tests: No results for input(s): TSH, T4TOTAL, FREET4, T3FREE, THYROIDAB in the last 72 hours. Anemia Panel: No results for input(s): VITAMINB12, FOLATE, FERRITIN, TIBC, IRON, RETICCTPCT in the last 72 hours. Urine analysis:    Component Value Date/Time   COLORURINE YELLOW 11/25/2015 0100   APPEARANCEUR CLEAR 11/25/2015 0100   LABSPEC 1.012 11/25/2015 0100   PHURINE 7.0 11/25/2015 0100   GLUCOSEU NEGATIVE 11/25/2015 0100   HGBUR TRACE* 11/25/2015 0100   BILIRUBINUR NEGATIVE 11/25/2015 0100   KETONESUR NEGATIVE 11/25/2015 0100   PROTEINUR NEGATIVE 11/25/2015 0100   UROBILINOGEN 0.2 11/13/2014 2008   NITRITE NEGATIVE 11/25/2015 0100   LEUKOCYTESUR NEGATIVE 11/25/2015 0100   Sepsis Labs: @LABRCNTIP (procalcitonin:4,lacticidven:4) )No results found for this or any previous visit (from the past 240 hour(s)).   Radiological Exams on Admission: Ct Head Wo Contrast  11/25/2015  CLINICAL DATA:  Evaluate known brain metastases. Acute onset of leg pain and spasms. Initial encounter. EXAM: CT HEAD WITHOUT CONTRAST TECHNIQUE: Contiguous axial images were obtained from the base of the skull through the vertex without intravenous contrast. COMPARISON:  CT of the head performed 10/25/2015 FINDINGS: The known metastatic lesion at the left convexity is more prominent than on the prior study, with persistent surrounding decreased attenuation. The visualized mass measures approximately 2.1 cm. No definite additional masses are identified on CT. No acute infarction or hemorrhage is seen. The posterior fossa, including the cerebellum, brainstem and fourth ventricle, is within normal limits. The third and lateral ventricles, and basal ganglia are unremarkable in appearance. There is no evidence of fracture; visualized osseous structures are unremarkable in appearance. The  orbits are within normal limits. The paranasal sinuses and mastoid air cells are well-aerated. No significant soft tissue abnormalities are seen. IMPRESSION: 1. Known metastatic lesion at the left convexity is more prominent, measuring 2.1 cm, with persistent surrounding decreased attenuation. 2. No definite additional masses seen on noncontrast CT, though MRI with contrast would be far more sensitive for metastatic lesions. Electronically Signed   By: Garald Balding M.D.   On: 11/25/2015 04:57   Ct Abdomen Pelvis W Contrast  11/25/2015  CLINICAL DATA:  Acute  onset of generalized abdominal pain and cramping. Leukocytosis. Initial encounter. EXAM: CT ABDOMEN AND PELVIS WITH CONTRAST TECHNIQUE: Multidetector CT imaging of the abdomen and pelvis was performed using the standard protocol following bolus administration of intravenous contrast. CONTRAST:  161mL ISOVUE-300 IOPAMIDOL (ISOVUE-300) INJECTION 61% COMPARISON:  CT of the abdomen and pelvis from 03/03/2015, and MRI of the lumbar spine performed 09/13/2015 FINDINGS: A few small nodules are noted at the left lung base, measuring up to 6 mm in size. Numerous hepatic lesions have increased significantly in size from the prior study, the largest of which demonstrates 6.0 cm, with central calcification. This is compatible with worsening diffuse metastatic disease to the liver. The spleen has been resected since the prior study. The large 7.4 cm hemangioma at the right hepatic lobe is grossly stable in appearance. The gallbladder is within normal limits. The pancreas and adrenal glands are unremarkable. Nonobstructing left renal stones measure up to 9 mm in size. There is incomplete rotation of the right kidney. The kidneys are otherwise unremarkable. There is no evidence of hydronephrosis. No obstructing ureteral stones are seen. No perinephric stranding is appreciated. No free fluid is identified. The small bowel is unremarkable in appearance. The stomach is within  normal limits. No acute vascular abnormalities are seen. The appendix is normal in caliber, without evidence of appendicitis. Postoperative change is noted about the proximal transverse colon. Postoperative change is also seen about the mid sigmoid colon, with apparent recurrent irregular mass measuring approximately 5.6 x 4.4 x 3.3 cm, surrounding soft tissue inflammation and presacral stranding, and some degree of dysmotility given stool more proximally. Additional mass extends superiorly from the mid sigmoid colon along the retroperitoneum on the left side, measuring up to 2.6 cm in size. The bladder is moderately distended and grossly unremarkable. Patient is status post hysterectomy. The recurrent mass extends adjacent to the vaginal cuff. No inguinal lymphadenopathy is seen. No acute osseous abnormalities are identified. IMPRESSION: 1. Recurrence of malignancy at the site of surgery at the mid sigmoid colon, with irregular mass measuring approximately 5.6 x 4.4 x 3.3 cm, surrounding soft tissue inflammation and presacral stranding, and some degree of bowel dysmotility given stool more proximally. 2. Additional mass extends superiorly from the mid sigmoid colon along the retroperitoneum on the left side, measuring up 2.6 cm in size. 3. Numerous hepatic lesions have increased significantly in size, measuring up to 6.0 cm, with central calcification, compatible with worsening diffuse metastatic disease the liver. 4. Few small nodules at the left lung base, measuring up to 6 mm in size. These are new or more prominent than on the prior study. Metastatic disease is a concern. 5. 7.4 cm hemangioma at the right hepatic lobe is grossly stable. 6. Nonobstructing left renal stones measure up to 9 mm in size. Electronically Signed   By: Garald Balding M.D.   On: 11/25/2015 04:12     Assessment/Plan Principal Problem:   Abdominal pain Active Problems:   Hypercalcemia   Colorectal cancer, stage IV, obstructing s/p  stent x2, ostomy, then LAR 03/06/2015   Pulmonary embolism ([place])   Brain metastases ([facility])   Metastatic colon cancer to liver ([place])   Lower extremity pain    #1. Worsening abdominal pain probably from progressive metastatic colon cancer stage IV - for now I have placed patient on IV Dilaudid for pain control. Discussed with patient's oncologist for further management. #2. Lower extremity spasm and pain - improved with Decadron which I have restarted again. Since patient  also had some mid back pain I have ordered MRI of the T and L-spine. Further recommendations per oncologist. #3. Anemia with mild bleeding rectally - patient noticed some blood in the stools yesterday and patient stool occult blood positive. I have discussed with patient that I will change xarelto to heparin but patient wants to continue xarelto. Closely monitor CBC I have type and screen. There is any further worsening of hemoglobin may have 2 hold anticoagulation. #4. History of PE on xarelto see #3. #5. Hypercalcemia - patient did receive zoledronic acid last month during admission. Patient received IV fluids in the ER. I have ordered repeat metabolic panel and if calcium remains persistently high may need a repeat dose of zoledronic acid.  I have requested palliative care consult.  DVT prophylaxis: Xarelto. Code Status: Full code.  Family Communication: No family at the bedside.  Disposition Plan: Home versus skilled nursing facility. To be decided.  Consults called: Palliative care.  Admission status: Observation. Telemetry.    Rise Patience MD [facility] Pager [phone].  If 7PM-7AM, please contact night-coverage [email or web address] Password [email or web address]  11/25/2015, 8:01 AM

## 2015-11-26 DIAGNOSIS — Z7189 Other specified counseling: Secondary | ICD-10-CM

## 2015-11-26 DIAGNOSIS — Z803 Family history of malignant neoplasm of breast: Secondary | ICD-10-CM | POA: Diagnosis not present

## 2015-11-26 DIAGNOSIS — Z90710 Acquired absence of both cervix and uterus: Secondary | ICD-10-CM | POA: Diagnosis not present

## 2015-11-26 DIAGNOSIS — I2782 Chronic pulmonary embolism: Secondary | ICD-10-CM | POA: Diagnosis present

## 2015-11-26 DIAGNOSIS — Z79899 Other long term (current) drug therapy: Secondary | ICD-10-CM | POA: Diagnosis not present

## 2015-11-26 DIAGNOSIS — Z885 Allergy status to narcotic agent status: Secondary | ICD-10-CM | POA: Diagnosis not present

## 2015-11-26 DIAGNOSIS — R1084 Generalized abdominal pain: Secondary | ICD-10-CM | POA: Diagnosis not present

## 2015-11-26 DIAGNOSIS — C189 Malignant neoplasm of colon, unspecified: Secondary | ICD-10-CM | POA: Diagnosis not present

## 2015-11-26 DIAGNOSIS — C787 Secondary malignant neoplasm of liver and intrahepatic bile duct: Secondary | ICD-10-CM | POA: Diagnosis present

## 2015-11-26 DIAGNOSIS — Z808 Family history of malignant neoplasm of other organs or systems: Secondary | ICD-10-CM | POA: Diagnosis not present

## 2015-11-26 DIAGNOSIS — Z515 Encounter for palliative care: Secondary | ICD-10-CM | POA: Diagnosis not present

## 2015-11-26 DIAGNOSIS — Z7901 Long term (current) use of anticoagulants: Secondary | ICD-10-CM | POA: Diagnosis not present

## 2015-11-26 DIAGNOSIS — C19 Malignant neoplasm of rectosigmoid junction: Principal | ICD-10-CM

## 2015-11-26 DIAGNOSIS — R1012 Left upper quadrant pain: Secondary | ICD-10-CM | POA: Diagnosis present

## 2015-11-26 DIAGNOSIS — Z7952 Long term (current) use of systemic steroids: Secondary | ICD-10-CM | POA: Diagnosis not present

## 2015-11-26 DIAGNOSIS — Z87891 Personal history of nicotine dependence: Secondary | ICD-10-CM | POA: Diagnosis not present

## 2015-11-26 DIAGNOSIS — M62838 Other muscle spasm: Secondary | ICD-10-CM | POA: Diagnosis present

## 2015-11-26 DIAGNOSIS — Z800 Family history of malignant neoplasm of digestive organs: Secondary | ICD-10-CM | POA: Diagnosis not present

## 2015-11-26 DIAGNOSIS — R262 Difficulty in walking, not elsewhere classified: Secondary | ICD-10-CM | POA: Diagnosis present

## 2015-11-26 DIAGNOSIS — C7931 Secondary malignant neoplasm of brain: Secondary | ICD-10-CM | POA: Diagnosis present

## 2015-11-26 LAB — URINE CULTURE

## 2015-11-26 MED ORDER — HYDROMORPHONE HCL 2 MG PO TABS
4.0000 mg | ORAL_TABLET | ORAL | Status: DC | PRN
  Administered 2015-11-26 – 2015-11-27 (×5): 4 mg via ORAL
  Filled 2015-11-26 (×5): qty 2

## 2015-11-26 MED ORDER — HYDROMORPHONE HCL 1 MG/ML IJ SOLN
1.0000 mg | INTRAMUSCULAR | Status: DC | PRN
  Administered 2015-11-26: 1 mg via INTRAVENOUS
  Filled 2015-11-26 (×2): qty 1

## 2015-11-26 NOTE — Progress Notes (Signed)
PROGRESS NOTE    JAXSON LEHAN  [id number] DOB: 11/06/1950 DOA: 11/24/2015 PCP: Kristine Garbe, MD    Brief Narrative: Mrs. 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 In the emergency room included a CT scan of abdomen and pelvis with IV contrast that revealed recurrence of malignancy at mid sigmoid colon, irregular mass measuring 5.6 x 4.4 x 3.3 cm. Radiology also reported numerous hepatic lesions increasing in size compared to prior study. She was evaluated by her oncologist Dr. Burr Medico who recommended taking palliative approach. The palliative medicine was consulted to facilitate medical goals of care discussions.         Assessment & Plan:   Principal Problem:   Abdominal pain Active Problems:   Hypercalcemia   Colorectal cancer, stage IV, obstructing s/p stent x2, ostomy, then LAR 03/06/2015   Pulmonary embolism ([place])   Brain metastases ([facility])   Metastatic colon cancer to liver [facility])   Lower extremity pain   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 [facility])   Muscle spasm of right leg   1.  Stage IV metastatic colon cancer. -CT scan of abdomen and pelvis performed on admission showing evidence of progression of her colon cancer. Radiology reporting a 5.6 x 4.4 x 3.3 cm irregular mass at the mid sigmoid colon associated with numerous hepatic lesions -Medical oncology consulted, she was evaluated by Dr. Burr Medico. Patient had previous to decline systemic chemotherapy -Dr Burr Medico recommending taking palliative approach. -Dr Domingo Cocking of palliative medicine consulted -From a pain management standpoint she reported improvement with systemic steroids. Plan to continue dexamethasone 4 mg IV every 12 hours -She is likely approaching  terminal stage and would benefit from hospice and palliative medicine involvement.   2.  History of pulmonary embolism. -She remains on Xarelto -She had reported mild rectal bleeding. Lab work showing stable hemoglobin of 8.6. Plan to continue monitoring CBC.  3.  Hypercalcemia of malignancy. -[place] treated with zoledronic acid -Lab work showing calcium of 11.7. -Repeat labs in a.m.   DVT prophylaxis: Fully anticoagulated with Xarelto Code Status: Remains a full code Family Communication:  Disposition Plan: Anticipate discharge home in the next 24-48 hours once pain controlled and goals of care established.   Consultants:   Medical oncology Palliative medicine    Subjective: She reports feeling better, pain symptoms controlled  Objective: Filed Vitals:   11/25/15 2200 11/26/15 0459 11/26/15 0600 11/26/15 1447  BP: 109/62  107/53 115/57  Pulse: 81  83 87  Temp: 98.3 F (36.8 C)  98.5 F (36.9 C) 98.1 F (36.7 C)  TempSrc: Oral  Oral Oral  Resp: 16  16 16   Height:      Weight:  66.6 kg (146 lb 13.2 oz) 66.3 kg (146 lb 2.6 oz)   SpO2: 100%  100% 98%    Intake/Output Summary (Last 24 hours) at 11/26/15 1612 Last data filed at 11/26/15 1300  Gross per 24 hour  Intake    480 ml  Output      4 ml  Net    476 ml   Filed Weights   11/25/15 0931 11/26/15 0459 11/26/15 0600  Weight: 66.8 kg (147 lb 4.3 oz) 66.6 kg (146 lb 13.2 oz) 66.3 kg (146 lb 2.6 oz)    Examination:  General exam: She is calm, cooperative, pleasant, no acute distress  Respiratory system: Clear to auscultation. Respiratory effort normal. Cardiovascular system: S1 & S2 heard, RRR. No JVD, murmurs, rubs, gallops or clicks. No pedal edema. Gastrointestinal system: Abdomen is nondistended, soft and nontender. No organomegaly or masses felt. Normal bowel sounds heard. Central nervous system: Alert and oriented. No focal neurological deficits. Extremities: Symmetric 5 x 5 power. Skin: No rashes,  lesions or ulcers Psychiatry: Judgement and insight appear normal. Mood & affect appropriate.     Data Reviewed: I have personally reviewed following labs and imaging studies  CBC:  Recent Labs Lab 11/24/15 2256 11/25/15 0906  WBC 11.0* 10.0  NEUTROABS 8.0* 9.3*  HGB 8.1* 8.6*  HCT 25.1* 26.6*  MCV 83.9 85.8  PLT 460* [id number]*   Basic Metabolic Panel:  Recent Labs Lab 11/24/15 2256 11/25/15 0906  NA 137 135  K 4.4 4.3  CL 107 106  CO2 22 22  GLUCOSE 124* 151*  BUN 18 13  CREATININE 0.66 0.51  CALCIUM 12.1* 11.7*   GFR: Estimated Creatinine Clearance: 64.8 mL/min (by C-G formula based on Cr of 0.51). Liver Function Tests:  Recent Labs Lab 11/24/15 2256 11/25/15 0906  AST 30 21  ALT 17 19  ALKPHOS 59 65  BILITOT 0.8 0.6  PROT 6.9 7.5  ALBUMIN 3.3* 3.5    Recent Labs Lab 11/24/15 2256  LIPASE 44   No results for input(s): AMMONIA in the last 168 hours. Coagulation Profile: No results for input(s): INR, PROTIME in the last 168 hours. Cardiac Enzymes: No results for input(s): CKTOTAL, CKMB, CKMBINDEX, TROPONINI in the last 168 hours. BNP (last 3 results) No results for input(s): PROBNP in the last 8760 hours. HbA1C: No results for input(s): HGBA1C in the last 72 hours. CBG: No results for input(s): GLUCAP in the last 168 hours. Lipid Profile: No results for input(s): CHOL, HDL, LDLCALC, TRIG, CHOLHDL, LDLDIRECT in the last 72 hours. Thyroid Function Tests: No results for input(s): TSH, T4TOTAL, FREET4, T3FREE, THYROIDAB in the last 72 hours. Anemia Panel: No results for input(s): VITAMINB12, FOLATE, FERRITIN, TIBC, IRON, RETICCTPCT in the last 72 hours. Sepsis Labs: No results for input(s): PROCALCITON, LATICACIDVEN in the last 168 hours.  Recent Results (from the past 240 hour(s))  Urine culture     Status: Abnormal   Collection Time: 11/25/15  1:00 AM  Result Value Ref Range Status   Specimen Description URINE, RANDOM  Final   Special Requests  NONE  Final   Culture MULTIPLE SPECIES PRESENT, SUGGEST RECOLLECTION (A)  Final   Report Status 11/26/2015 FINAL  Final         Radiology Studies: Ct Head Wo Contrast  11/25/2015  CLINICAL DATA:  Evaluate known brain metastases. Acute onset of leg pain and spasms. Initial encounter. EXAM: CT HEAD WITHOUT CONTRAST TECHNIQUE: Contiguous axial images were obtained from the base of the skull through the vertex without intravenous contrast. COMPARISON:  CT of the head performed 10/25/2015 FINDINGS: The known metastatic lesion at the left convexity is more prominent than on the prior study, with persistent surrounding decreased attenuation. The visualized mass measures approximately 2.1 cm. No definite additional masses are identified on CT. No acute infarction or hemorrhage is seen. The posterior fossa, including the cerebellum, brainstem and fourth ventricle, is within normal limits. The third and lateral ventricles, and basal ganglia are unremarkable in appearance. There is no evidence of fracture; visualized osseous structures are unremarkable in appearance. The orbits are within normal  limits. The paranasal sinuses and mastoid air cells are well-aerated. No significant soft tissue abnormalities are seen. IMPRESSION: 1. Known metastatic lesion at the left convexity is more prominent, measuring 2.1 cm, with persistent surrounding decreased attenuation. 2. No definite additional masses seen on noncontrast CT, though MRI with contrast would be far more sensitive for metastatic lesions. Electronically Signed   By: Garald Balding M.D.   On: 11/25/2015 04:57   Mr Thoracic Spine W Wo Contrast  11/25/2015  CLINICAL DATA:  Back pain.  Metastatic colon cancer. EXAM: MRI THORACIC AND LUMBAR SPINE WITHOUT AND WITH CONTRAST TECHNIQUE: Multiplanar and multiecho pulse sequences of the thoracic and lumbar spine were obtained without and with intravenous contrast. CONTRAST:  48mL MULTIHANCE GADOBENATE DIMEGLUMINE 529  MG/ML IV SOLN COMPARISON:  MRI cervical and thoracic spine 09/13/2015 FINDINGS: MRI THORACIC SPINE FINDINGS Alignment:  Normal.  Mild levoscoliosis Vertebrae: Negative for fracture or metastatic disease. Bone marrow signal normal. No enhancing lesions postcontrast administration Cord: Normal signal and morphology of the spinal cord. No cord compression. Paraspinal and other soft tissues: Negative Disc levels: Mild thoracic disc degeneration. Small left-sided disc protrusion T8-9. Negative for spinal stenosis. MRI LUMBAR SPINE FINDINGS Segmentation:  S1 partially lumbarized.  Small disc space at S1-S2. Alignment:  Normal alignment Vertebrae: Negative for fracture. No bone marrow lesion identified. Negative for metastatic disease in the lumbar spine. Conus medullaris: Extends to the mid L2 level and appears normal. Paraspinal and other soft tissues: Soft tissue anterior to the S2 vertebral body most consistent with tumor. This enhances. This appears to represent tumor on the prior CT abdomen pelvis 11/25/2015. This is touching the vertebral body without definite invasion of the bone marrow. Multiple hepatic metastatic lesions are noted Disc levels: L1-2:  Negative L2-3:  Mild disc and mild facet degeneration without spinal stenosis L3-4: Mild disc bulging and mild facet degeneration without spinal stenosis L4-5: Disc degeneration with disc bulging and mild spurring. Moderate facet hypertrophy. Mild narrowing of the spinal canal. Mild right foraminal narrowing L5-S1: Moderate disc degeneration with disc space narrowing and diffuse endplate spurring. No significant spinal or foraminal stenosis. IMPRESSION: Negative for metastatic disease in the thoracic spine. Negative for metastatic disease in the lumbar spine. Chronic lumbar degenerative changes without acute disc protrusion Abnormal soft tissue in the presacral space at S2 compatible with tumor as noted on prior CT. Multiple liver metastasis. Electronically Signed    By: Franchot Gallo M.D.   On: 11/25/2015 17:19   Mr Lumbar Spine W Wo Contrast  11/25/2015  CLINICAL DATA:  Back pain.  Metastatic colon cancer. EXAM: MRI THORACIC AND LUMBAR SPINE WITHOUT AND WITH CONTRAST TECHNIQUE: Multiplanar and multiecho pulse sequences of the thoracic and lumbar spine were obtained without and with intravenous contrast. CONTRAST:  30mL MULTIHANCE GADOBENATE DIMEGLUMINE 529 MG/ML IV SOLN COMPARISON:  MRI cervical and thoracic spine 09/13/2015 FINDINGS: MRI THORACIC SPINE FINDINGS Alignment:  Normal.  Mild levoscoliosis Vertebrae: Negative for fracture or metastatic disease. Bone marrow signal normal. No enhancing lesions postcontrast administration Cord: Normal signal and morphology of the spinal cord. No cord compression. Paraspinal and other soft tissues: Negative Disc levels: Mild thoracic disc degeneration. Small left-sided disc protrusion T8-9. Negative for spinal stenosis. MRI LUMBAR SPINE FINDINGS Segmentation:  S1 partially lumbarized.  Small disc space at S1-S2. Alignment:  Normal alignment Vertebrae: Negative for fracture. No bone marrow lesion identified. Negative for metastatic disease in the lumbar spine. Conus medullaris: Extends to the mid L2 level and appears normal. Paraspinal and  other soft tissues: Soft tissue anterior to the S2 vertebral body most consistent with tumor. This enhances. This appears to represent tumor on the prior CT abdomen pelvis 11/25/2015. This is touching the vertebral body without definite invasion of the bone marrow. Multiple hepatic metastatic lesions are noted Disc levels: L1-2:  Negative L2-3:  Mild disc and mild facet degeneration without spinal stenosis L3-4: Mild disc bulging and mild facet degeneration without spinal stenosis L4-5: Disc degeneration with disc bulging and mild spurring. Moderate facet hypertrophy. Mild narrowing of the spinal canal. Mild right foraminal narrowing L5-S1: Moderate disc degeneration with disc space narrowing and  diffuse endplate spurring. No significant spinal or foraminal stenosis. IMPRESSION: Negative for metastatic disease in the thoracic spine. Negative for metastatic disease in the lumbar spine. Chronic lumbar degenerative changes without acute disc protrusion Abnormal soft tissue in the presacral space at S2 compatible with tumor as noted on prior CT. Multiple liver metastasis. Electronically Signed   By: Franchot Gallo M.D.   On: 11/25/2015 17:19   Ct Abdomen Pelvis W Contrast  11/25/2015  CLINICAL DATA:  Acute onset of generalized abdominal pain and cramping. Leukocytosis. Initial encounter. EXAM: CT ABDOMEN AND PELVIS WITH CONTRAST TECHNIQUE: Multidetector CT imaging of the abdomen and pelvis was performed using the standard protocol following bolus administration of intravenous contrast. CONTRAST:  145mL ISOVUE-300 IOPAMIDOL (ISOVUE-300) INJECTION 61% COMPARISON:  CT of the abdomen and pelvis from 03/03/2015, and MRI of the lumbar spine performed 09/13/2015 FINDINGS: A few small nodules are noted at the left lung base, measuring up to 6 mm in size. Numerous hepatic lesions have increased significantly in size from the prior study, the largest of which demonstrates 6.0 cm, with central calcification. This is compatible with worsening diffuse metastatic disease to the liver. The spleen has been resected since the prior study. The large 7.4 cm hemangioma at the right hepatic lobe is grossly stable in appearance. The gallbladder is within normal limits. The pancreas and adrenal glands are unremarkable. Nonobstructing left renal stones measure up to 9 mm in size. There is incomplete rotation of the right kidney. The kidneys are otherwise unremarkable. There is no evidence of hydronephrosis. No obstructing ureteral stones are seen. No perinephric stranding is appreciated. No free fluid is identified. The small bowel is unremarkable in appearance. The stomach is within normal limits. No acute vascular abnormalities  are seen. The appendix is normal in caliber, without evidence of appendicitis. Postoperative change is noted about the proximal transverse colon. Postoperative change is also seen about the mid sigmoid colon, with apparent recurrent irregular mass measuring approximately 5.6 x 4.4 x 3.3 cm, surrounding soft tissue inflammation and presacral stranding, and some degree of dysmotility given stool more proximally. Additional mass extends superiorly from the mid sigmoid colon along the retroperitoneum on the left side, measuring up to 2.6 cm in size. The bladder is moderately distended and grossly unremarkable. Patient is status post hysterectomy. The recurrent mass extends adjacent to the vaginal cuff. No inguinal lymphadenopathy is seen. No acute osseous abnormalities are identified. IMPRESSION: 1. Recurrence of malignancy at the site of surgery at the mid sigmoid colon, with irregular mass measuring approximately 5.6 x 4.4 x 3.3 cm, surrounding soft tissue inflammation and presacral stranding, and some degree of bowel dysmotility given stool more proximally. 2. Additional mass extends superiorly from the mid sigmoid colon along the retroperitoneum on the left side, measuring up 2.6 cm in size. 3. Numerous hepatic lesions have increased significantly in size, measuring up to  6.0 cm, with central calcification, compatible with worsening diffuse metastatic disease the liver. 4. Few small nodules at the left lung base, measuring up to 6 mm in size. These are new or more prominent than on the prior study. Metastatic disease is a concern. 5. 7.4 cm hemangioma at the right hepatic lobe is grossly stable. 6. Nonobstructing left renal stones measure up to 9 mm in size. Electronically Signed   By: Garald Balding M.D.   On: 11/25/2015 04:12        Scheduled Meds: . dexamethasone  4 mg Intravenous Q12H  . ondansetron (ZOFRAN) IV  4 mg Intravenous Once  . pantoprazole (PROTONIX) IV  40 mg Intravenous Q24H  . rivaroxaban   20 mg Oral Q supper   Continuous Infusions:    LOS: 1 day    Time spent: 30 minutes    Kelvin Cellar, MD [facility] Pager [phone]  If 7PM-7AM, please contact night-coverage [email or web address] Password [email or web address] 11/26/2015, 4:12 PM

## 2015-11-27 DIAGNOSIS — C787 Secondary malignant neoplasm of liver and intrahepatic bile duct: Secondary | ICD-10-CM

## 2015-11-27 LAB — BASIC METABOLIC PANEL
ANION GAP: 6 (ref 5–15)
BUN: 13 mg/dL (ref 6–20)
CHLORIDE: 112 mmol/L — AB (ref 101–111)
CO2: 20 mmol/L — AB (ref 22–32)
Calcium: 10.9 mg/dL — ABNORMAL HIGH (ref 8.9–10.3)
Creatinine, Ser: 0.54 mg/dL (ref 0.44–1.00)
GFR calc Af Amer: >60 mL/min (ref >60)
GFR calc non Af Amer: >60 mL/min (ref >60)
GLUCOSE: 126 mg/dL — AB (ref 65–99)
POTASSIUM: 4.4 mmol/L (ref 3.5–5.1)
Sodium: 138 mmol/L (ref 135–145)

## 2015-11-27 LAB — CBC
HEMATOCRIT: 26 % — AB (ref 36.0–46.0)
HEMOGLOBIN: 8.2 g/dL — AB (ref 12.0–15.0)
MCH: 27.3 pg (ref 26.0–34.0)
MCHC: 31.5 g/dL (ref 30.0–36.0)
MCV: 86.7 fL (ref 78.0–100.0)
Platelets: 572 10*3/uL — ABNORMAL HIGH (ref 150–400)
RBC: 3 MIL/uL — AB (ref 3.87–5.11)
RDW: 16.4 % — AB (ref 11.5–15.5)
WBC: 8.8 10*3/uL (ref 4.0–10.5)

## 2015-11-27 MED ORDER — HYDROMORPHONE HCL 4 MG PO TABS: 4.0000 mg | ORAL_TABLET | ORAL | Status: DC | PRN

## 2015-11-27 MED ORDER — DEXAMETHASONE 2 MG PO TABS: 4.0000 mg | ORAL_TABLET | Freq: Three times a day (TID) | ORAL | Status: DC

## 2015-11-27 MED ORDER — DEXAMETHASONE 4 MG PO TABS
4.0000 mg | ORAL_TABLET | Freq: Two times a day (BID) | ORAL | Status: DC
  Administered 2015-11-27: 4 mg via ORAL
  Filled 2015-11-27 (×2): qty 1

## 2015-11-27 NOTE — Consult Note (Signed)
Consultation Note Date: 11/27/2015   Patient Name: [name] [name]  DOB: 1951-06-13  MRN: [id number]  Age / Sex: 65 y.o., female  PCP: Lin Landsman, MD Referring Physician: Kelvin Cellar, MD  Reason for Consultation: Establishing goals of care  HPI/Patient Profile: 65 y.o. 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   Clinical Assessment and Goals of Care: I met today with [name] [name].  We talked about what is most important to her. She reports that her faith, her family, being at home are the most important things to her. She states that she knows that she has terminal cancer, but she believes herself to be a miracle (based on the fact she is still alive). She states that God has not used her for her purpose and that he is going to continue to allow her to live, "until it is His time."  She has had a lot of side effects to treatments in the past, and is not interested in pursuing any further disease modifying therapy, such as chemotherapy.  She reports that her pain and weakness in her leg has improved since being readmitted to the hospital and restarting steroids.     We had a long discussion regarding her clinical course to this point in time as well as pathways moving forward.  We discussed that the hospital can be useful as long as she is getting well enough from care she receives at the hospital to enjoy time at home, but we are reaching a point where if her goal is to be at home, she would likely be better served to plan on being at home and bringing care to her at home through support of organization such as hospice rather repeated trips to the hospital. We discussed hospice as a tool that may be beneficial in this goal as she has reached a point where we are trying to  fix problems that are not fixable.  She was very polite during my conversation with her, but I do not think that she was really listening most talking with her about potential of enrollment with hospice support. She is very adamant God will continue to take care of her and states, "He will show me what to do."  SUMMARY OF RECOMMENDATIONS   - From our discussion, I think that hospice would serve [name] [name] well with her stated goals of being at home, feeling as well as possible for as long as possible, spending time in her faith, and spending time with family.  I told her I feel that this bottle of care would be the best way to support her at home.  At the same time, she will not really engage me in conversation regarding potential for hospice support. She is very adamant that God will continue to care for her and she does not need to worry about things such as this. She told me  that she will talk with her family about everything we discussed and will pray on the matter. I do not foresee her coming to a point emotionally where she would consider discharge with hospice support following this admission. - I attempted to speak with her about ensuring care she receives in the hospital is in line with her goals of being well enough to return home. Attempted to speak about CODE STATUS in regards to this. She is not receptive this conversation. - I left a copy of  Hard Choices for [place] for her to review. - She reports that her pain is better controlled today. We talked about working to transition to oral regimen. I placed order for oral medication with IV backup in case oral route is ineffective.  I think the large change she has seen is due to restarting steroids. Would recommend continuing this. - We'll continue to follow  Code Status/Advance Care Planning:  Full code   Symptom Managas above Palliative Prophylaxis:   Aspiration, Bowel Regimen, Delirium Protocol and Frequent Pain  Assessment  Psycho-social/Spiritual:   Desire for further Chaplaincy support:no  Additional Recommendations: Caregiving  Support/Resources  Prognosis:   < 6 months and she would qualify for hospice if so desired in any point in the future   Discharge Planning: To Be Determined. Likely, I think she would choose to return home with home health      Primary Diagnoses: Present on Admission:  . Pulmonary embolism ([place]) . Metastatic colon cancer to liver ([place]) . Hypercalcemia . Colorectal cancer, stage IV, obstructing s/p stent x2, ostomy, then LAR 03/06/2015 . Brain metastases ([place]) . Abdominal pain . Lower extremity pain  I have reviewed the medical record, interviewed the patient and family, and examined the patient. The following aspects are pertinent.  Past Medical History  Diagnosis Date  . Colon cancer ([place])     dx'd 2014  . Heart murmur     ? as a child   . Pulmonary embolism ([place]) 11/2014   . Anemia     hx of    Social History   Social History  . Marital Status: Married    Spouse Name: N/A  . Number of Children: 3  . Years of Education: N/A   Occupational History  .     Social History Main Topics  . Smoking status: Former Smoker -- 1 years    Types: Cigarettes    Quit date: 05/24/1971  . Smokeless tobacco: Never Used  . Alcohol Use: No  . Drug Use: No  . Sexual Activity: Not Asked   Other Topics Concern  . None   Social History Narrative   Family History  Problem Relation Age of Onset  . Colon cancer Sister 55  . Diabetes Neg Hx   . Thyroid disease Neg Hx   . Breast cancer Maternal Aunt   . Prostate cancer Maternal Uncle   . Bone cancer Maternal Grandfather   . Prostate cancer Maternal Uncle    Scheduled Meds: . dexamethasone  4 mg Intravenous Q12H  . ondansetron (ZOFRAN) IV  4 mg Intravenous Once  . pantoprazole (PROTONIX) IV  40 mg Intravenous Q24H  . rivaroxaban  20 mg Oral Q supper   Continuous Infusions:  PRN Meds:.acetaminophen  **OR** acetaminophen, HYDROmorphone (DILAUDID) injection, HYDROmorphone, ondansetron **OR** ondansetron (ZOFRAN) IV, polyethylene glycol Medications Prior to Admission:  Prior to Admission medications   Medication Sig Start Date End Date Taking? Authorizing Provider  acetaminophen (TYLENOL) 500 MG tablet Take  500 mg by mouth every 6 (six) hours as needed for moderate pain or headache.    Yes Historical Provider, MD  Cyanocobalamin (VITAMIN B 12 PO) Take 1 tablet by mouth every morning.   Yes Historical Provider, MD  HYDROcodone-acetaminophen (NORCO/VICODIN) 5-325 MG tablet Take 1-2 tablets by mouth every 6 (six) hours as needed for moderate pain. 10/28/15  Yes Domenic Polite, MD  IRON PO Take 1 tablet by mouth every morning.   Yes Historical Provider, MD  pantoprazole (PROTONIX) 40 MG tablet Take 1 tablet by mouth daily. 10/01/15  Yes Historical Provider, MD  polyethylene glycol powder (GLYCOLAX/MIRALAX) powder TAKE 34 G (2 DOSES) BY MOUTH DAILY AS NEEDED FOR MILD CONSTIPATION OR MODERATE CONSTIPATION 03/03/14  Yes Concha Norway, MD  rivaroxaban (XARELTO) 20 MG TABS tablet Take 1 tablet (20 mg total) by mouth daily with supper. 11/11/15  Yes Truitt Merle, MD  dexamethasone (DECADRON) 2 MG tablet Take 2 tablets (4 mg total) by mouth 3 (three) times daily. 10/28/15   Domenic Polite, MD   Allergies  Allergen Reactions  . Oxycontin [Oxycodone Hcl] Anaphylaxis, Hives and Itching  . Codeine Itching and Nausea And Vomiting   Review of Systems  Constitutional: Positive for activity change, fatigue and unexpected weight change.  Gastrointestinal: Positive for abdominal pain.  Musculoskeletal: Positive for myalgias and back pain.  Psychiatric/Behavioral: Positive for sleep disturbance.  All other systems reviewed and are negative.   Physical Exam  General: Alert, awake, in no acute distress.  HEENT: No bruits, no goiter, no JVD Heart: Regular rate and rhythm. No murmur appreciated. Lungs: Good air movement,  clear Abdomen: Soft, nontender, nondistended, positive bowel sounds.  Ext: No significant edema Skin: Warm and dry Neuro: Grossly intact, nonfocal.   Vital Signs: BP 116/58 mmHg  Pulse 83  Temp(Src) 97.8 F (36.6 C) (Oral)  Resp 18  Ht '5\' 5"'  (1.651 m)  Wt 67.3 kg (148 lb 5.9 oz)  BMI 24.69 kg/m2  SpO2 100% Pain Assessment: 0-10 POSS *See Group Information*: 1-Acceptable,Awake and alert Pain Score: 3    SpO2: SpO2: 100 % O2 Device:SpO2: 100 % O2 Flow Rate: .   IO: Intake/output summary:  Intake/Output Summary (Last 24 hours) at 11/27/15 0841 Last data filed at 11/27/15 4034  Gross per 24 hour  Intake    720 ml  Output      6 ml  Net    714 ml    LBM: Last BM Date: 11/25/15 Baseline Weight: Weight: 66.8 kg (147 lb 4.3 oz) Most recent weight: Weight: 67.3 kg (148 lb 5.9 oz)     Palliative Assessment/Data:   Flowsheet Rows        Most Recent Value   Intake Tab    Referral Department  Hospitalist   Unit at Time of Referral  Med/Surg Unit   Palliative Care Primary Diagnosis  Cancer   Date Notified  11/25/15   Palliative Care Type  Return patient Palliative Care   Reason for referral  Clarify Goals of Care   Date of Admission  11/24/15   Date first seen by Palliative Care  11/25/15   # of days Palliative referral response time  0 Day(s)   # of days IP prior to Palliative referral  1   Clinical Assessment    Psychosocial & Spiritual Assessment    Palliative Care Outcomes       Time In: 1030 Time Out: 1145 Time Total: 75 Greater than 50%  of this time was spent counseling  and coordinating care related to the above assessment and plan.  Signed by: [name], MD   Please contact Palliative Medicine Team phone at [phone] for questions and concerns.  For individual provider: See Shea Evans

## 2015-11-27 NOTE — Discharge Summary (Signed)
Physician Discharge Summary  [name] [name] [id number] DOB: Aug 07, 1950 DOA: 11/24/2015  PCP: Kristine Garbe, MD  Admit date: 11/24/2015 Discharge date: 11/27/2015  Time spent: 35 minutes   Recommendations for Outpatient Follow-up:  1. 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 Her medical oncologist Dr.Feng recommending shifting her focus of care to comfort. I discussed home hospice, patient for now expressed wishes to resume home health services and will discuss hospice services with her family, making a decision at a certain point in the future. 2. She was discharged on dexamethasone 4 mg by mouth 3 times a day along with Dilaudid 4 mg every 3 hours as needed. Please follow-up on pain symptoms.   Discharge Diagnoses:  Principal Problem:   Abdominal pain Active Problems:   Hypercalcemia   Colorectal cancer, stage IV, obstructing s/p stent x2, ostomy, then LAR 03/06/2015   Pulmonary embolism (HCC)   Brain metastases ([place])   Metastatic colon cancer to liver [facility])   Lower extremity pain   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 [facility])   Muscle spasm of right leg   Discharge Condition: Stable  Diet recommendation: As tolerated  Filed Weights   11/26/15 0459 11/26/15 0600 11/27/15 0500  Weight: 66.6 kg (146 lb 13.2 oz) 66.3 kg (146 lb 2.6 oz) 67.3 kg (148 lb 5.9 oz)    History of present illness:  [name] [name] is a 65 y.o. 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 Patient states she was on Decadron until 3 days ago for the brain metastases which was slowly weaned off. Patient started having spasms of the lower extremities with difficulty walking and in the ER patient also had some mid back pain. Patient also had been having  abdominal pain which has been worsening last few days mostly in the left upper quadrant but no nausea vomiting or diarrhea. Patient did notice some blood in the stools yesterday. CT abdomen and pelvis done in the ER shows recurrence of the cancer at the surgical site with multiple metastasis and intermittent changes. Patient was given 1 dose of Decadron in the ER following which patient spasms of lower extremity improved and at this time is resolved.  Hospital Course:  [name] [name] is a pleasant 65 year old female with a past medical history of stage IV metastatic colon cancer involving liver and brain, having history of pulmonary embolism anticoagulated with Xarelto, admitted to the medicine service on 11/25/2015 when she presented with complaints of right lower extremity pain and spasms. Workup in the emergency room included a CT scan of abdomen and pelvis with IV contrast that revealed recurrence of malignancy at mid sigmoid colon, irregular mass measuring 5.6 x 4.4 x 3.3 cm. Radiology also reported numerous hepatic lesions increasing in size compared to prior study. She was evaluated by her oncologist Dr. Burr Medico who recommended taking palliative approach. The palliative medicine was consulted to facilitate medical goals of care discussions.She had been recently taken off of steroids. Decadron was restarted during this hospitalization after which she had significant clinical improvement. Palliative medicine recommending Dilaudid 4 mg by mouth every 3 hours as needed for pain management. We discussed receiving care from hospice services in the home setting. She stated that she was not ready to make that decision and preferred to  discuss this more with her family members. She wishes to put it in "God's hands" and have her strong family support involved in her care at home. She also requested that home health services for physical therapy pre-reordered on discharge. I spoke with case manager, side face-to-face for  resumption of home health services for physical therapy and nursing. She was discharged to her home in stable condition on 11/27/2015.   Consultations:  Palliative medicine  Medical oncology  Discharge Exam: Filed Vitals:   11/26/15 2145 11/27/15 0606  BP: 112/49 116/58  Pulse: 73 83  Temp: 98.4 F (36.9 C) 97.8 F (36.6 C)  Resp: 18 18    General: Nontoxic-appearing, she is awake and alert following commands Cardiovascular: Regular rate and rhythm normal S1-S2 Respiratory: Normal respiratory effort lungs are clear to auscultation bilaterally Abdomen: Soft nontender nondistended Extremities: No edema  Discharge Instructions   Discharge Instructions    Call MD for:  difficulty breathing, headache or visual disturbances    Complete by:  As directed      Call MD for:  extreme fatigue    Complete by:  As directed      Call MD for:  hives    Complete by:  As directed      Call MD for:  persistant dizziness or light-headedness    Complete by:  As directed      Call MD for:  persistant nausea and vomiting    Complete by:  As directed      Call MD for:  redness, tenderness, or signs of infection (pain, swelling, redness, odor or green/yellow discharge around incision site)    Complete by:  As directed      Call MD for:  severe uncontrolled pain    Complete by:  As directed      Call MD for:  temperature >100.4    Complete by:  As directed      Call MD for:    Complete by:  As directed      Diet - low sodium heart healthy    Complete by:  As directed      Increase activity slowly    Complete by:  As directed           Current Discharge Medication List    START taking these medications   Details  HYDROmorphone (DILAUDID) 4 MG tablet Take 1 tablet (4 mg total) by mouth every 3 (three) hours as needed for severe pain (First line medication.  Please give prior to giving IV route.). Qty: 30 tablet, Refills: 0      CONTINUE these medications which have CHANGED    Details  dexamethasone (DECADRON) 2 MG tablet Take 2 tablets (4 mg total) by mouth 3 (three) times daily. Qty: 90 tablet, Refills: 0      CONTINUE these medications which have NOT CHANGED   Details  acetaminophen (TYLENOL) 500 MG tablet Take 500 mg by mouth every 6 (six) hours as needed for moderate pain or headache.     IRON PO Take 1 tablet by mouth every morning.    pantoprazole (PROTONIX) 40 MG tablet Take 1 tablet by mouth daily. Refills: 1    polyethylene glycol powder (GLYCOLAX/MIRALAX) powder TAKE 34 G (2 DOSES) BY MOUTH DAILY AS NEEDED FOR MILD CONSTIPATION OR MODERATE CONSTIPATION Qty: 255 g, Refills: 0    rivaroxaban (XARELTO) 20 MG TABS tablet Take 1 tablet (20 mg total) by mouth daily with supper. Qty: 30 tablet, Refills: 3  Associated Diagnoses: Other chronic pulmonary embolism without acute cor pulmonale (HCC)      STOP taking these medications     Cyanocobalamin (VITAMIN B 12 PO)      HYDROcodone-acetaminophen (NORCO/VICODIN) 5-325 MG tablet        Allergies  Allergen Reactions  . Oxycontin [Oxycodone Hcl] Anaphylaxis, Hives and Itching  . Codeine Itching and Nausea And Vomiting   Follow-up Information    Follow up with REESE,BETTI D, MD In 1 week.   Specialty:  Family Medicine   Contact information:   [id number] [id number] [phone]       Follow up with Truitt Merle, MD In 2 weeks.   Specialties:  Hematology, Oncology   Contact information:   [place] [id number] [phone]        The results of significant diagnostics from this hospitalization (including imaging, microbiology, ancillary and laboratory) are listed below for reference.    Significant Diagnostic Studies: Ct Head Wo Contrast  11/25/2015  CLINICAL DATA:  Evaluate known brain metastases. Acute onset of leg pain and spasms. Initial encounter. EXAM: CT HEAD WITHOUT CONTRAST TECHNIQUE: Contiguous axial images were obtained from the base  of the skull through the vertex without intravenous contrast. COMPARISON:  CT of the head performed 10/25/2015 FINDINGS: The known metastatic lesion at the left convexity is more prominent than on the prior study, with persistent surrounding decreased attenuation. The visualized mass measures approximately 2.1 cm. No definite additional masses are identified on CT. No acute infarction or hemorrhage is seen. The posterior fossa, including the cerebellum, brainstem and fourth ventricle, is within normal limits. The third and lateral ventricles, and basal ganglia are unremarkable in appearance. There is no evidence of fracture; visualized osseous structures are unremarkable in appearance. The orbits are within normal limits. The paranasal sinuses and mastoid air cells are well-aerated. No significant soft tissue abnormalities are seen. IMPRESSION: 1. Known metastatic lesion at the left convexity is more prominent, measuring 2.1 cm, with persistent surrounding decreased attenuation. 2. No definite additional masses seen on noncontrast CT, though MRI with contrast would be far more sensitive for metastatic lesions. Electronically Signed   By: Garald Balding M.D.   On: 11/25/2015 04:57   Mr Thoracic Spine W Wo Contrast  11/25/2015  CLINICAL DATA:  Back pain.  Metastatic colon cancer. EXAM: MRI THORACIC AND LUMBAR SPINE WITHOUT AND WITH CONTRAST TECHNIQUE: Multiplanar and multiecho pulse sequences of the thoracic and lumbar spine were obtained without and with intravenous contrast. CONTRAST:  27mL MULTIHANCE GADOBENATE DIMEGLUMINE 529 MG/ML IV SOLN COMPARISON:  MRI cervical and thoracic spine 09/13/2015 FINDINGS: MRI THORACIC SPINE FINDINGS Alignment:  Normal.  Mild levoscoliosis Vertebrae: Negative for fracture or metastatic disease. Bone marrow signal normal. No enhancing lesions postcontrast administration Cord: Normal signal and morphology of the spinal cord. No cord compression. Paraspinal and other soft tissues:  Negative Disc levels: Mild thoracic disc degeneration. Small left-sided disc protrusion T8-9. Negative for spinal stenosis. MRI LUMBAR SPINE FINDINGS Segmentation:  S1 partially lumbarized.  Small disc space at S1-S2. Alignment:  Normal alignment Vertebrae: Negative for fracture. No bone marrow lesion identified. Negative for metastatic disease in the lumbar spine. Conus medullaris: Extends to the mid L2 level and appears normal. Paraspinal and other soft tissues: Soft tissue anterior to the S2 vertebral body most consistent with tumor. This enhances. This appears to represent tumor on the prior CT abdomen pelvis 11/25/2015. This is touching the vertebral body without  definite invasion of the bone marrow. Multiple hepatic metastatic lesions are noted Disc levels: L1-2:  Negative L2-3:  Mild disc and mild facet degeneration without spinal stenosis L3-4: Mild disc bulging and mild facet degeneration without spinal stenosis L4-5: Disc degeneration with disc bulging and mild spurring. Moderate facet hypertrophy. Mild narrowing of the spinal canal. Mild right foraminal narrowing L5-S1: Moderate disc degeneration with disc space narrowing and diffuse endplate spurring. No significant spinal or foraminal stenosis. IMPRESSION: Negative for metastatic disease in the thoracic spine. Negative for metastatic disease in the lumbar spine. Chronic lumbar degenerative changes without acute disc protrusion Abnormal soft tissue in the presacral space at S2 compatible with tumor as noted on prior CT. Multiple liver metastasis. Electronically Signed   By: Franchot Gallo M.D.   On: 11/25/2015 17:19   Mr Lumbar Spine W Wo Contrast  11/25/2015  CLINICAL DATA:  Back pain.  Metastatic colon cancer. EXAM: MRI THORACIC AND LUMBAR SPINE WITHOUT AND WITH CONTRAST TECHNIQUE: Multiplanar and multiecho pulse sequences of the thoracic and lumbar spine were obtained without and with intravenous contrast. CONTRAST:  59mL MULTIHANCE GADOBENATE  DIMEGLUMINE 529 MG/ML IV SOLN COMPARISON:  MRI cervical and thoracic spine 09/13/2015 FINDINGS: MRI THORACIC SPINE FINDINGS Alignment:  Normal.  Mild levoscoliosis Vertebrae: Negative for fracture or metastatic disease. Bone marrow signal normal. No enhancing lesions postcontrast administration Cord: Normal signal and morphology of the spinal cord. No cord compression. Paraspinal and other soft tissues: Negative Disc levels: Mild thoracic disc degeneration. Small left-sided disc protrusion T8-9. Negative for spinal stenosis. MRI LUMBAR SPINE FINDINGS Segmentation:  S1 partially lumbarized.  Small disc space at S1-S2. Alignment:  Normal alignment Vertebrae: Negative for fracture. No bone marrow lesion identified. Negative for metastatic disease in the lumbar spine. Conus medullaris: Extends to the mid L2 level and appears normal. Paraspinal and other soft tissues: Soft tissue anterior to the S2 vertebral body most consistent with tumor. This enhances. This appears to represent tumor on the prior CT abdomen pelvis 11/25/2015. This is touching the vertebral body without definite invasion of the bone marrow. Multiple hepatic metastatic lesions are noted Disc levels: L1-2:  Negative L2-3:  Mild disc and mild facet degeneration without spinal stenosis L3-4: Mild disc bulging and mild facet degeneration without spinal stenosis L4-5: Disc degeneration with disc bulging and mild spurring. Moderate facet hypertrophy. Mild narrowing of the spinal canal. Mild right foraminal narrowing L5-S1: Moderate disc degeneration with disc space narrowing and diffuse endplate spurring. No significant spinal or foraminal stenosis. IMPRESSION: Negative for metastatic disease in the thoracic spine. Negative for metastatic disease in the lumbar spine. Chronic lumbar degenerative changes without acute disc protrusion Abnormal soft tissue in the presacral space at S2 compatible with tumor as noted on prior CT. Multiple liver metastasis.  Electronically Signed   By: Franchot Gallo M.D.   On: 11/25/2015 17:19   Ct Abdomen Pelvis W Contrast  11/25/2015  CLINICAL DATA:  Acute onset of generalized abdominal pain and cramping. Leukocytosis. Initial encounter. EXAM: CT ABDOMEN AND PELVIS WITH CONTRAST TECHNIQUE: Multidetector CT imaging of the abdomen and pelvis was performed using the standard protocol following bolus administration of intravenous contrast. CONTRAST:  170mL ISOVUE-300 IOPAMIDOL (ISOVUE-300) INJECTION 61% COMPARISON:  CT of the abdomen and pelvis from 03/03/2015, and MRI of the lumbar spine performed 09/13/2015 FINDINGS: A few small nodules are noted at the left lung base, measuring up to 6 mm in size. Numerous hepatic lesions have increased significantly in size from the prior study, the  largest of which demonstrates 6.0 cm, with central calcification. This is compatible with worsening diffuse metastatic disease to the liver. The spleen has been resected since the prior study. The large 7.4 cm hemangioma at the right hepatic lobe is grossly stable in appearance. The gallbladder is within normal limits. The pancreas and adrenal glands are unremarkable. Nonobstructing left renal stones measure up to 9 mm in size. There is incomplete rotation of the right kidney. The kidneys are otherwise unremarkable. There is no evidence of hydronephrosis. No obstructing ureteral stones are seen. No perinephric stranding is appreciated. No free fluid is identified. The small bowel is unremarkable in appearance. The stomach is within normal limits. No acute vascular abnormalities are seen. The appendix is normal in caliber, without evidence of appendicitis. Postoperative change is noted about the proximal transverse colon. Postoperative change is also seen about the mid sigmoid colon, with apparent recurrent irregular mass measuring approximately 5.6 x 4.4 x 3.3 cm, surrounding soft tissue inflammation and presacral stranding, and some degree of  dysmotility given stool more proximally. Additional mass extends superiorly from the mid sigmoid colon along the retroperitoneum on the left side, measuring up to 2.6 cm in size. The bladder is moderately distended and grossly unremarkable. Patient is status post hysterectomy. The recurrent mass extends adjacent to the vaginal cuff. No inguinal lymphadenopathy is seen. No acute osseous abnormalities are identified. IMPRESSION: 1. Recurrence of malignancy at the site of surgery at the mid sigmoid colon, with irregular mass measuring approximately 5.6 x 4.4 x 3.3 cm, surrounding soft tissue inflammation and presacral stranding, and some degree of bowel dysmotility given stool more proximally. 2. Additional mass extends superiorly from the mid sigmoid colon along the retroperitoneum on the left side, measuring up 2.6 cm in size. 3. Numerous hepatic lesions have increased significantly in size, measuring up to 6.0 cm, with central calcification, compatible with worsening diffuse metastatic disease the liver. 4. Few small nodules at the left lung base, measuring up to 6 mm in size. These are new or more prominent than on the prior study. Metastatic disease is a concern. 5. 7.4 cm hemangioma at the right hepatic lobe is grossly stable. 6. Nonobstructing left renal stones measure up to 9 mm in size. Electronically Signed   By: Garald Balding M.D.   On: 11/25/2015 04:12    Microbiology: Recent Results (from the past 240 hour(s))  Urine culture     Status: Abnormal   Collection Time: 11/25/15  1:00 AM  Result Value Ref Range Status   Specimen Description URINE, RANDOM  Final   Special Requests NONE  Final   Culture MULTIPLE SPECIES PRESENT, SUGGEST RECOLLECTION (A)  Final   Report Status 11/26/2015 FINAL  Final     Labs: Basic Metabolic Panel:  Recent Labs Lab 11/24/15 2256 11/25/15 0906 11/27/15 0508  NA 137 135 138  K 4.4 4.3 4.4  CL 107 106 112*  CO2 22 22 20*  GLUCOSE 124* 151* 126*  BUN 18 13  13   CREATININE 0.66 0.51 0.54  CALCIUM 12.1* 11.7* 10.9*   Liver Function Tests:  Recent Labs Lab 11/24/15 2256 11/25/15 0906  AST 30 21  ALT 17 19  ALKPHOS 59 65  BILITOT 0.8 0.6  PROT 6.9 7.5  ALBUMIN 3.3* 3.5    Recent Labs Lab 11/24/15 2256  LIPASE 44   No results for input(s): AMMONIA in the last 168 hours. CBC:  Recent Labs Lab 11/24/15 2256 11/25/15 0906 11/27/15 0508  WBC 11.0* 10.0  8.8  NEUTROABS 8.0* 9.3*  --   HGB 8.1* 8.6* 8.2*  HCT 25.1* 26.6* 26.0*  MCV 83.9 85.8 86.7  PLT 460* 498* 572*   Cardiac Enzymes: No results for input(s): CKTOTAL, CKMB, CKMBINDEX, TROPONINI in the last 168 hours. BNP: BNP (last 3 results) No results for input(s): BNP in the last 8760 hours.  ProBNP (last 3 results) No results for input(s): PROBNP in the last 8760 hours.  CBG: No results for input(s): GLUCAP in the last 168 hours.     Signed:  Kelvin Cellar MD.  [facility] 11/27/2015, 9:41 AM

## 2015-12-02 ENCOUNTER — Other Ambulatory Visit: Payer: Self-pay | Admitting: Family Medicine

## 2015-12-02 MED ORDER — PANTOPRAZOLE SODIUM 40 MG PO TBEC: 40.0000 mg | DELAYED_RELEASE_TABLET | Freq: Every day | ORAL | Status: DC

## 2015-12-03 ENCOUNTER — Encounter: Payer: Self-pay | Admitting: Radiation Oncology

## 2015-12-04 ENCOUNTER — Encounter: Payer: Self-pay | Admitting: Radiation Oncology

## 2015-12-04 ENCOUNTER — Telehealth: Payer: Self-pay | Admitting: *Deleted

## 2015-12-04 NOTE — Telephone Encounter (Signed)
CALLED PATIENT TO INFORM OF FU ON 12-08-15 @ 1 PM, LVM FOR A RETURN CALL

## 2015-12-08 ENCOUNTER — Encounter: Payer: Self-pay | Admitting: Radiation Oncology

## 2015-12-08 ENCOUNTER — Ambulatory Visit
Admission: RE | Admit: 2015-12-08 | Discharge: 2015-12-08 | Disposition: A | Discharge status: Home / Self Care | Payer: Medicare Other | Source: Ambulatory Visit | Attending: Radiation Oncology | Admitting: Radiation Oncology

## 2015-12-08 VITALS — BP 142/63 | HR 112 | Temp 98.3°F | Resp 20 | Ht 65.0 in | Wt 143.7 lb

## 2015-12-08 DIAGNOSIS — C7931 Secondary malignant neoplasm of brain: Secondary | ICD-10-CM | POA: Diagnosis not present

## 2015-12-08 DIAGNOSIS — C19 Malignant neoplasm of rectosigmoid junction: Secondary | ICD-10-CM | POA: Insufficient documentation

## 2015-12-08 DIAGNOSIS — N200 Calculus of kidney: Secondary | ICD-10-CM | POA: Insufficient documentation

## 2015-12-08 DIAGNOSIS — M5124 Other intervertebral disc displacement, thoracic region: Secondary | ICD-10-CM | POA: Diagnosis not present

## 2015-12-08 DIAGNOSIS — M47816 Spondylosis without myelopathy or radiculopathy, lumbar region: Secondary | ICD-10-CM | POA: Diagnosis not present

## 2015-12-08 DIAGNOSIS — Z7901 Long term (current) use of anticoagulants: Secondary | ICD-10-CM | POA: Insufficient documentation

## 2015-12-08 DIAGNOSIS — D1803 Hemangioma of intra-abdominal structures: Secondary | ICD-10-CM | POA: Insufficient documentation

## 2015-12-08 DIAGNOSIS — C189 Malignant neoplasm of colon, unspecified: Secondary | ICD-10-CM | POA: Diagnosis present

## 2015-12-08 HISTORY — DX: Personal history of irradiation: Z92.3

## 2015-12-08 MED ORDER — FLUCONAZOLE 100 MG PO TABS: 100.0000 mg | ORAL_TABLET | Freq: Every day | ORAL | Status: DC

## 2015-12-08 NOTE — Progress Notes (Signed)
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 ExacTrac Snap verification was performed for each couch angle.   Narrative:  The patient returns today for routine follow-up since her radiotherapy. The patient presented to the ED on 11/24/15 for right leg and abdominal pain and right leg spasms. The patient was subsequently admitted to the hospital. CT of the abd/pelvis with contrast on 11/25/15 showed recurrence at the site of surgery at the mid sigmoid colon measuring 5.6 x 4.4 x 3.3 cm, an additional mas extending from the mid sigmoid colon along the retroperitoneum on the left side measuring 2.6 cm, numerous hepatic lesions that have increased in size, and the appearance of small nodules at the left lung base concerning for metastatic disease. CT of the head showed that the known metastatic lesion at the left convexity is more prominent and measuring 2.1 cm with persistent surrounding decreased attenuation. MRI of the spine noted new new metastases, except for the tumor in the presacral space at S2 which was previously noted on prior CT scans. Dr. Burr Medico met the patient on 11/25/15 to discuss hospice care.  The patient is taking decadron 4 mg TID and has enough to last through this Sunday. The nurse noted thrush on her tongue. The patient has sore gums, blurry vision, and poor taste. The patient denies headaches, dizziness, nausea, and the patient states her coordination is better. The patient states her right leg feels "stable" with a "fuzzy" feeling under the sole of her foot at times.  ALLERGIES:  is allergic to oxycontin and  codeine.  Meds: Current Outpatient Prescriptions  Medication Sig Dispense Refill  . acetaminophen (TYLENOL) 500 MG tablet Take 500 mg by mouth every 6 (six) hours as needed for moderate pain or headache.     . cyanocobalamin 100 MCG tablet Take 100 mcg by mouth.    . dexamethasone (DECADRON) 2 MG tablet Take 2 tablets (4 mg total) by mouth 3 (three) times daily. 90 tablet 0  . HYDROmorphone (DILAUDID) 4 MG tablet Take 1 tablet (4 mg total) by mouth every 3 (three) hours as needed for severe pain (First line medication.  Please give prior to giving IV route.). 30 tablet 0  . IRON PO Take 1 tablet by mouth every morning.    . pantoprazole (PROTONIX) 40 MG tablet Take 1 tablet (40 mg total) by mouth daily. 30 tablet 0  . polyethylene glycol powder (GLYCOLAX/MIRALAX) powder TAKE 34 G (2 DOSES) BY MOUTH DAILY AS NEEDED FOR MILD CONSTIPATION OR MODERATE CONSTIPATION 255 g 0  . rivaroxaban (XARELTO) 20 MG TABS tablet Take 1 tablet (20 mg total) by mouth daily with supper. 30 tablet 3  . fluconazole (DIFLUCAN) 100 MG tablet Take 1 tablet (100 mg total) by mouth daily. Take 2 tabs x 1, then 1 tab QD x 9 additional days. 11 tablet 0   No current facility-administered medications for this encounter.    Physical Findings:  height is '5\' 5"'  (1.651 m) and weight is 143 lb 11.2 oz (65.182 kg). Her oral temperature is 98.3 F (36.8 C). Her blood pressure is 142/63 and her pulse is  112. Her respiration is 20 and oxygen saturation is 100%.  In general this is a well appearing African-American female in no acute distress. She's alert and oriented x4 and appropriate throughout the examination. The patient has thrush present on the oral exam.  Lab Findings: Lab Results  Component Value Date   WBC 8.8 11/27/2015   HGB 8.2* 11/27/2015   HCT 26.0* 11/27/2015   MCV 86.7 11/27/2015   PLT 572* 11/27/2015     Radiographic Findings: Ct Head Wo Contrast  11/25/2015  CLINICAL DATA:  Evaluate known brain  metastases. Acute onset of leg pain and spasms. Initial encounter. EXAM: CT HEAD WITHOUT CONTRAST TECHNIQUE: Contiguous axial images were obtained from the base of the skull through the vertex without intravenous contrast. COMPARISON:  CT of the head performed 10/25/2015 FINDINGS: The known metastatic lesion at the left convexity is more prominent than on the prior study, with persistent surrounding decreased attenuation. The visualized mass measures approximately 2.1 cm. No definite additional masses are identified on CT. No acute infarction or hemorrhage is seen. The posterior fossa, including the cerebellum, brainstem and fourth ventricle, is within normal limits. The third and lateral ventricles, and basal ganglia are unremarkable in appearance. There is no evidence of fracture; visualized osseous structures are unremarkable in appearance. The orbits are within normal limits. The paranasal sinuses and mastoid air cells are well-aerated. No significant soft tissue abnormalities are seen. IMPRESSION: 1. Known metastatic lesion at the left convexity is more prominent, measuring 2.1 cm, with persistent surrounding decreased attenuation. 2. No definite additional masses seen on noncontrast CT, though MRI with contrast would be far more sensitive for metastatic lesions. Electronically Signed   By: Garald Balding M.D.   On: 11/25/2015 04:57   Mr Thoracic Spine W Wo Contrast  11/25/2015  CLINICAL DATA:  Back pain.  Metastatic colon cancer. EXAM: MRI THORACIC AND LUMBAR SPINE WITHOUT AND WITH CONTRAST TECHNIQUE: Multiplanar and multiecho pulse sequences of the thoracic and lumbar spine were obtained without and with intravenous contrast. CONTRAST:  41m MULTIHANCE GADOBENATE DIMEGLUMINE 529 MG/ML IV SOLN COMPARISON:  MRI cervical and thoracic spine 09/13/2015 FINDINGS: MRI THORACIC SPINE FINDINGS Alignment:  Normal.  Mild levoscoliosis Vertebrae: Negative for fracture or metastatic disease. Bone marrow signal normal.  No enhancing lesions postcontrast administration Cord: Normal signal and morphology of the spinal cord. No cord compression. Paraspinal and other soft tissues: Negative Disc levels: Mild thoracic disc degeneration. Small left-sided disc protrusion T8-9. Negative for spinal stenosis. MRI LUMBAR SPINE FINDINGS Segmentation:  S1 partially lumbarized.  Small disc space at S1-S2. Alignment:  Normal alignment Vertebrae: Negative for fracture. No bone marrow lesion identified. Negative for metastatic disease in the lumbar spine. Conus medullaris: Extends to the mid L2 level and appears normal. Paraspinal and other soft tissues: Soft tissue anterior to the S2 vertebral body most consistent with tumor. This enhances. This appears to represent tumor on the prior CT abdomen pelvis 11/25/2015. This is touching the vertebral body without definite invasion of the bone marrow. Multiple hepatic metastatic lesions are noted Disc levels: L1-2:  Negative L2-3:  Mild disc and mild facet degeneration without spinal stenosis L3-4: Mild disc bulging and mild facet degeneration without spinal stenosis L4-5: Disc degeneration with disc bulging and mild spurring. Moderate facet hypertrophy. Mild narrowing of the spinal canal. Mild right foraminal narrowing L5-S1: Moderate disc degeneration with disc space narrowing and diffuse endplate spurring. No significant spinal or foraminal stenosis. IMPRESSION: Negative for metastatic disease in the thoracic spine. Negative  for metastatic disease in the lumbar spine. Chronic lumbar degenerative changes without acute disc protrusion Abnormal soft tissue in the presacral space at S2 compatible with tumor as noted on prior CT. Multiple liver metastasis. Electronically Signed   By: Franchot Gallo M.D.   On: 11/25/2015 17:19   Mr Lumbar Spine W Wo Contrast  11/25/2015  CLINICAL DATA:  Back pain.  Metastatic colon cancer. EXAM: MRI THORACIC AND LUMBAR SPINE WITHOUT AND WITH CONTRAST TECHNIQUE:  Multiplanar and multiecho pulse sequences of the thoracic and lumbar spine were obtained without and with intravenous contrast. CONTRAST:  75m MULTIHANCE GADOBENATE DIMEGLUMINE 529 MG/ML IV SOLN COMPARISON:  MRI cervical and thoracic spine 09/13/2015 FINDINGS: MRI THORACIC SPINE FINDINGS Alignment:  Normal.  Mild levoscoliosis Vertebrae: Negative for fracture or metastatic disease. Bone marrow signal normal. No enhancing lesions postcontrast administration Cord: Normal signal and morphology of the spinal cord. No cord compression. Paraspinal and other soft tissues: Negative Disc levels: Mild thoracic disc degeneration. Small left-sided disc protrusion T8-9. Negative for spinal stenosis. MRI LUMBAR SPINE FINDINGS Segmentation:  S1 partially lumbarized.  Small disc space at S1-S2. Alignment:  Normal alignment Vertebrae: Negative for fracture. No bone marrow lesion identified. Negative for metastatic disease in the lumbar spine. Conus medullaris: Extends to the mid L2 level and appears normal. Paraspinal and other soft tissues: Soft tissue anterior to the S2 vertebral body most consistent with tumor. This enhances. This appears to represent tumor on the prior CT abdomen pelvis 11/25/2015. This is touching the vertebral body without definite invasion of the bone marrow. Multiple hepatic metastatic lesions are noted Disc levels: L1-2:  Negative L2-3:  Mild disc and mild facet degeneration without spinal stenosis L3-4: Mild disc bulging and mild facet degeneration without spinal stenosis L4-5: Disc degeneration with disc bulging and mild spurring. Moderate facet hypertrophy. Mild narrowing of the spinal canal. Mild right foraminal narrowing L5-S1: Moderate disc degeneration with disc space narrowing and diffuse endplate spurring. No significant spinal or foraminal stenosis. IMPRESSION: Negative for metastatic disease in the thoracic spine. Negative for metastatic disease in the lumbar spine. Chronic lumbar degenerative  changes without acute disc protrusion Abnormal soft tissue in the presacral space at S2 compatible with tumor as noted on prior CT. Multiple liver metastasis. Electronically Signed   By: CFranchot GalloM.D.   On: 11/25/2015 17:19   Ct Abdomen Pelvis W Contrast  11/25/2015  CLINICAL DATA:  Acute onset of generalized abdominal pain and cramping. Leukocytosis. Initial encounter. EXAM: CT ABDOMEN AND PELVIS WITH CONTRAST TECHNIQUE: Multidetector CT imaging of the abdomen and pelvis was performed using the standard protocol following bolus administration of intravenous contrast. CONTRAST:  1028mISOVUE-300 IOPAMIDOL (ISOVUE-300) INJECTION 61% COMPARISON:  CT of the abdomen and pelvis from 03/03/2015, and MRI of the lumbar spine performed 09/13/2015 FINDINGS: A few small nodules are noted at the left lung base, measuring up to 6 mm in size. Numerous hepatic lesions have increased significantly in size from the prior study, the largest of which demonstrates 6.0 cm, with central calcification. This is compatible with worsening diffuse metastatic disease to the liver. The spleen has been resected since the prior study. The large 7.4 cm hemangioma at the right hepatic lobe is grossly stable in appearance. The gallbladder is within normal limits. The pancreas and adrenal glands are unremarkable. Nonobstructing left renal stones measure up to 9 mm in size. There is incomplete rotation of the right kidney. The kidneys are otherwise unremarkable. There is no evidence of hydronephrosis. No obstructing  ureteral stones are seen. No perinephric stranding is appreciated. No free fluid is identified. The small bowel is unremarkable in appearance. The stomach is within normal limits. No acute vascular abnormalities are seen. The appendix is normal in caliber, without evidence of appendicitis. Postoperative change is noted about the proximal transverse colon. Postoperative change is also seen about the mid sigmoid colon, with apparent  recurrent irregular mass measuring approximately 5.6 x 4.4 x 3.3 cm, surrounding soft tissue inflammation and presacral stranding, and some degree of dysmotility given stool more proximally. Additional mass extends superiorly from the mid sigmoid colon along the retroperitoneum on the left side, measuring up to 2.6 cm in size. The bladder is moderately distended and grossly unremarkable. Patient is status post hysterectomy. The recurrent mass extends adjacent to the vaginal cuff. No inguinal lymphadenopathy is seen. No acute osseous abnormalities are identified. IMPRESSION: 1. Recurrence of malignancy at the site of surgery at the mid sigmoid colon, with irregular mass measuring approximately 5.6 x 4.4 x 3.3 cm, surrounding soft tissue inflammation and presacral stranding, and some degree of bowel dysmotility given stool more proximally. 2. Additional mass extends superiorly from the mid sigmoid colon along the retroperitoneum on the left side, measuring up 2.6 cm in size. 3. Numerous hepatic lesions have increased significantly in size, measuring up to 6.0 cm, with central calcification, compatible with worsening diffuse metastatic disease the liver. 4. Few small nodules at the left lung base, measuring up to 6 mm in size. These are new or more prominent than on the prior study. Metastatic disease is a concern. 5. 7.4 cm hemangioma at the right hepatic lobe is grossly stable. 6. Nonobstructing left renal stones measure up to 9 mm in size. Electronically Signed   By: Garald Balding M.D.   On: 11/25/2015 04:12    Impression/Plan:  1. Stage IV colorectal cancer with brain metastasis. The patient states that she feels "good" and wants to keep her independence. The patient should begin a Decadron taper today. She will take 4 mg BID. I will also prescribe Fluconazole for the patient's thrush and call the patient's pharmacy on Monday for a refill of her Decadron after learning how she does this week after a reduced  dose. The patient's pharmacy is the [facility] on [address].  ------------------------------------------------  Jodelle Gross, MD, PhD  This document serves as a record of services personally performed by Kyung Rudd, MD. It was created on his behalf by Darcus Austin, a trained medical scribe. The creation of this record is based on the scribe's personal observations and the provider's statements to them. This document has been checked and approved by the attending provider.

## 2015-12-08 NOTE — Progress Notes (Addendum)
Follow up s/p SRS brain3/16/17, MRI results from 11/25/15, patient jtakes decadron 2mg  tablets,( 4 mg  Total )TID  Has enough to last through this Sunday, , has positive thrush on tongue,  Sore gums as well,and is having blurry vision now, taste of foods poor,  No head aches, no dizzy ness,  No nausea, coordination much better stated patient,  BP 142/63 mmHg  Pulse 112  Temp(Src) 98.3 F (36.8 C) (Oral)  Resp 20  Ht 5\' 5"  (1.651 m)  Wt 143 lb 11.2 oz (65.182 kg)  BMI 23.91 kg/m2  SpO2 100%  Took manual Pulse, regular ,skips a beat  At times,  Wt Readings from Last 3 Encounters:  12/08/15 143 lb 11.2 oz (65.182 kg)  11/27/15 148 lb 5.9 oz (67.3 kg)  10/25/15 150 lb 9.6 oz (68.312 kg)   1:07 PM

## 2015-12-14 ENCOUNTER — Encounter: Payer: Self-pay | Admitting: Radiation Oncology

## 2015-12-15 ENCOUNTER — Other Ambulatory Visit (HOSPITAL_BASED_OUTPATIENT_CLINIC_OR_DEPARTMENT_OTHER): Payer: Medicare Other

## 2015-12-15 ENCOUNTER — Ambulatory Visit (HOSPITAL_BASED_OUTPATIENT_CLINIC_OR_DEPARTMENT_OTHER): Payer: Medicare Other

## 2015-12-15 ENCOUNTER — Ambulatory Visit (HOSPITAL_BASED_OUTPATIENT_CLINIC_OR_DEPARTMENT_OTHER): Payer: Medicare Other | Admitting: Hematology

## 2015-12-15 ENCOUNTER — Other Ambulatory Visit: Payer: Medicaid Other

## 2015-12-15 ENCOUNTER — Telehealth: Payer: Self-pay | Admitting: Hematology

## 2015-12-15 ENCOUNTER — Encounter: Payer: Self-pay | Admitting: Hematology

## 2015-12-15 ENCOUNTER — Telehealth: Payer: Self-pay | Admitting: *Deleted

## 2015-12-15 VITALS — BP 121/66 | HR 134 | Temp 100.0°F | Resp 18 | Ht 65.0 in | Wt 139.3 lb

## 2015-12-15 DIAGNOSIS — C7931 Secondary malignant neoplasm of brain: Secondary | ICD-10-CM | POA: Diagnosis not present

## 2015-12-15 DIAGNOSIS — R509 Fever, unspecified: Secondary | ICD-10-CM

## 2015-12-15 DIAGNOSIS — C19 Malignant neoplasm of rectosigmoid junction: Secondary | ICD-10-CM | POA: Diagnosis present

## 2015-12-15 DIAGNOSIS — D638 Anemia in other chronic diseases classified elsewhere: Secondary | ICD-10-CM

## 2015-12-15 DIAGNOSIS — Z86711 Personal history of pulmonary embolism: Secondary | ICD-10-CM

## 2015-12-15 DIAGNOSIS — C787 Secondary malignant neoplasm of liver and intrahepatic bile duct: Secondary | ICD-10-CM | POA: Diagnosis not present

## 2015-12-15 DIAGNOSIS — C786 Secondary malignant neoplasm of retroperitoneum and peritoneum: Secondary | ICD-10-CM

## 2015-12-15 DIAGNOSIS — D6481 Anemia due to antineoplastic chemotherapy: Secondary | ICD-10-CM

## 2015-12-15 DIAGNOSIS — Z95828 Presence of other vascular implants and grafts: Secondary | ICD-10-CM

## 2015-12-15 DIAGNOSIS — I2699 Other pulmonary embolism without acute cor pulmonale: Secondary | ICD-10-CM

## 2015-12-15 LAB — CBC & DIFF AND RETIC
BASO%: 0 % (ref 0.0–2.0)
Basophils Absolute: 0 10*3/uL (ref 0.0–0.1)
EOS%: 0 % (ref 0.0–7.0)
Eosinophils Absolute: 0 10*3/uL (ref 0.0–0.5)
HEMATOCRIT: 29.2 % — AB (ref 34.8–46.6)
HGB: 9.3 g/dL — ABNORMAL LOW (ref 11.6–15.9)
IMMATURE RETIC FRACT: 8.6 % (ref 1.60–10.00)
LYMPH%: 3.6 % — AB (ref 14.0–49.7)
MCH: 26.3 pg (ref 25.1–34.0)
MCHC: 31.8 g/dL (ref 31.5–36.0)
MCV: 82.7 fL (ref 79.5–101.0)
MONO#: 0.7 10*3/uL (ref 0.1–0.9)
MONO%: 3.6 % (ref 0.0–14.0)
NEUT#: 17.1 10*3/uL — ABNORMAL HIGH (ref 1.5–6.5)
NEUT%: 92.8 % — AB (ref 38.4–76.8)
PLATELETS: 403 10*3/uL — AB (ref 145–400)
RBC: 3.53 10*6/uL — AB (ref 3.70–5.45)
RDW: 15.4 % — ABNORMAL HIGH (ref 11.2–14.5)
Retic %: 2.27 % — ABNORMAL HIGH (ref 0.70–2.10)
Retic Ct Abs: 80.13 10*3/uL (ref 33.70–90.70)
WBC: 18.5 10*3/uL — ABNORMAL HIGH (ref 3.9–10.3)
lymph#: 0.7 10*3/uL — ABNORMAL LOW (ref 0.9–3.3)

## 2015-12-15 LAB — COMPREHENSIVE METABOLIC PANEL
ALBUMIN: 3.4 g/dL — AB (ref 3.5–5.0)
ALK PHOS: 76 U/L (ref 40–150)
ALT: 28 U/L (ref 0–55)
ANION GAP: 9 meq/L (ref 3–11)
AST: 19 U/L (ref 5–34)
BUN: 22.8 mg/dL (ref 7.0–26.0)
CALCIUM: 13.7 mg/dL — AB (ref 8.4–10.4)
CO2: 24 meq/L (ref 22–29)
Chloride: 97 mEq/L — ABNORMAL LOW (ref 98–109)
Creatinine: 0.8 mg/dL (ref 0.6–1.1)
Glucose: 107 mg/dl (ref 70–140)
Potassium: 4.5 mEq/L (ref 3.5–5.1)
Sodium: 130 mEq/L — ABNORMAL LOW (ref 136–145)
TOTAL PROTEIN: 7.2 g/dL (ref 6.4–8.3)

## 2015-12-15 LAB — IRON AND TIBC
%SAT: 7 % — ABNORMAL LOW (ref 21–57)
IRON: 25 ug/dL — AB (ref 41–142)
TIBC: 354 ug/dL (ref 236–444)
UIBC: 329 ug/dL (ref 120–384)

## 2015-12-15 LAB — URINALYSIS, MICROSCOPIC - CHCC
BILIRUBIN (URINE): NEGATIVE
Glucose: NEGATIVE mg/dL
KETONES: NEGATIVE mg/dL
Nitrite: NEGATIVE
Protein: NEGATIVE mg/dL
SPECIFIC GRAVITY, URINE: 1.02 (ref 1.003–1.035)
Urobilinogen, UR: 0.2 mg/dL (ref 0.2–1)
pH: 6 (ref 4.6–8.0)

## 2015-12-15 LAB — FERRITIN: FERRITIN: 34 ng/mL (ref 9–269)

## 2015-12-15 MED ORDER — SODIUM CHLORIDE 0.9 % IJ SOLN
10.0000 mL | INTRAMUSCULAR | Status: DC | PRN
  Administered 2015-12-15: 10 mL via INTRAVENOUS
  Filled 2015-12-15: qty 10

## 2015-12-15 MED ORDER — HEPARIN SOD (PORK) LOCK FLUSH 100 UNIT/ML IV SOLN
500.0000 [IU] | Freq: Once | INTRAVENOUS | Status: AC | PRN
  Administered 2015-12-15: 500 [IU] via INTRAVENOUS
  Filled 2015-12-15: qty 5

## 2015-12-15 NOTE — Progress Notes (Signed)
[facility] ONCOLOGY OFFICE PROGRESS NOTE   DIAGNOSIS: Metastatic colon cancer to liver and peritoneum  Chief complaint: Follow-up colon cancer  CURRENT TREATMENT:  Observation  Oncology History   Colorectal cancer, stage IV, obstructing s/p colonic diversion   Staging form: Colon and Rectum, AJCC 7th Edition     Clinical: Stage IVA (TX, N0, M1a) - Signed by Concha Norway, MD on 09/15/2013       Colorectal cancer, stage IV, obstructing s/p stent x2, ostomy, then [facility] 03/06/2015   05/22/2013 - 05/27/2013 Hospital Admission A/w abdominal distention and constipation for 2 days CT scan of her abdomen and pelvis showed area of bowel wall thickening at the rectosigmoid junction concerning for malignancy.   05/22/2013 Imaging CT of Abdomen: bowel wall thickening with shouldering of the proximal and distal margins, involving therectosigmoid junction, supicous liver spleen mets. Colonsocopy recommended.    05/23/2013 Tumor Marker CEA 225.7   05/24/2013 Imaging MRI of abdomen: 3.7 x 4.4 cm enhancing lesion along the superior spleen, indeterminate but worrisome for metastasis.2.7 x 3.9 cm enhancing lesion in the medial segment left hepatic lobe,    05/25/2013 Pathology Results Diagnosis Rectum, biopsy, proximal - INVASIVE ADENOCARCINOMA.   05/25/2013 Procedure Flex sig: (Dr. Ardis Hughs). 5-6 cm obstructing rectosigmoid mass distal end 10 cm from anal verge; mass biopsed and then stented 9 cm long, 22 cm diameter uncovered SEM.    05/25/2013 Initial Diagnosis Colorectal cancer, stage IV   06/25/2013 Procedure R port a cath placement.   07/09/2013 - 08/20/2013 Chemotherapy FOLFOX q 2 weeks started.  Not elgible for clinical trial and bevacimab based on stent and high risk for perforation. She received a total of 4 cycles.    07/30/2013 Tumor Marker KRAS positive.  p53, APC identified. Mismatch Repair (MMR) preserved.    08/06/2013 Adverse Reaction Complains of darkening of her hands with peeling.    08/27/2013 Family History She was seen by Genetic couselor.  She declined testing today, but will call and reschedule an appointment should she decide to pursue testing.   09/07/2013 Imaging CT abdomen: Mixed response to therapy. Response to therapy of hepatic metastasis. right hepatic hemangiomas are again identified. Other too small to characterize liver lesions are felt to be similar but are indeterminate.. Enlargement of a splenic lesions   09/13/2013 - 06/10/2014 Chemotherapy Second line chemo FOLFIRI for a total of 16 cycles    09/13/2013 Tumor Marker CEA 33.5   09/13/2013 Treatment Plan Change Reviewed scans consistent with mixed response.  Switch to FOLFIRI, she received a total of 16 cycles, with a few breaks due to hospitalization and chemo holiday.    10/29/2013 Adverse Reaction Patient reports intense abdominal cramping lasting the first few days on chemotherapy.  Starting Hyoscyamine 0.125 mg q 6 hours prn.    11/15/2013 - 11/15/2013 Hospital Admission Patient presented to ED with migrated stent.  Stent retrieved by Dr. Ardis Hughs.  Imaging negative for perforation.   Discussed in conference with referral to Dr. Zella Richer made.    11/23/2013 Imaging CT Chest. 1. Stable left lower lobe 5 mm pulmonary nodule. 2. Interval increase in size splenic mass is concerning formetastatic lesion.3. Interval calcification of the left hepatic lobe metastasis. Noevidence of active residual disease by CT.   07/01/2014 Imaging CT restaging scan showed stable disease, improved liver lesion.    07/16/2014 - 10/24/2014 Chemotherapy maintenance chemo with capacitain 1078m/m2 (18045m bid started, dose decresed to 150021mid on C1D8 due to tolerance issue. Pt declined surgery. Chemo held  due to hospitalization and disease progression.    08/16/2014 - 08/20/2014 Hospital Admission She was admitted for bowel obstruction from sigmoid colon mass. She declined surgery, and had sigmoid colon stent placement. Her symptoms resolved  afterwards.   10/25/2014 - 10/31/2014 Hospital Admission Admitted for bowel obstruction, CT scan showed sigmoid colon tumor growth through the stent, pt underwent Transverse loop colostomy on 10/28/2014.    11/13/2014 - 11/18/2014 Hospital Admission she was admitted for transverse loop colostomy prolapse, managed conservatively. CT chest showed RLL PE, discharged home on Xarelto    01/03/2015 - 01/07/2015 Hospital Admission palliative repair of loop colostomy prolapse with partial colectomy 01/03/15   03/05/2015 Imaging CT chest, abdomen and pelvis showed known colorectal  Them, multiple new liver metastasis,. Noticed enhancing splenic lesion continues to enlarge, nonobstructive calculus in the left renal collecting system.   03/06/2015 Surgery Laparoscopic low anterior rectosigmoid to resection, splenectomy, takedown loop colostomy, vaginal cuff peritoneal biopsy, colon polypectomyx4    03/06/2015 Pathology Results Well differentiated adenocarcinoma in rectosigmoid colon, 32 lymph nodes were negative, vaginal cuff biopsy was positive for metastatic adenocarcinoma, [place] with plasma cell infiltrate and fibrosis, no metastatic carcinoma.   09/12/2015 - 09/17/2015 Hospital Admission Pt presented with b/l leg weakness, was admitted for brain metastasis    09/13/2015 Imaging Brain MRI with and without contrast showed a 1.7 cm left frontoparietal mass with moderate vasogenic edema, consistent with solitary metastasis   09/18/2015 - 09/18/2015 Radiation Therapy SRS to solitary brain met    09/18/2015 -  Radiation Therapy Stereotactic radiation to oligo brain metastases   CURRENT THERAPY:   supportive care   INTERVAL HISTORY:  [name] [name] 65 y.o. female with a history of Stage IV colon cancer is here for follow-up. She was hospitalized in late May for persistent leg cramps Stopped she stopped steroids. Her symptom quickly improved when steroids were started. She was discharged subsequently. She has improved significantly  since then, she is able to walk independently, occasionally use walker or cane when she goes out. Her right-sided weakness has much improved, she is able to function well at home. She denies any significant headaches, or other neurological symptoms. She was seen by Dr. Lisbeth Renshaw last week, who suggested tapering her steroids. She has good appetite, and skin some weight. She has occasional left-sided abdominal cramps, bowel movements normal, no other new complaints.  Past Medical History  Diagnosis Date  . Colon cancer ([place])     dx'd 2014  . Heart murmur     ? as a child   . Pulmonary embolism ([place]) 11/2014   . Anemia     hx of   . S/P radiation therapy 09/18/15    SRS left frontoparietal 20Gy    ALLERGIES:  is allergic to oxycontin and codeine.  MEDICATIONS: has a current medication list which includes the following prescription(s): acetaminophen, cyanocobalamin, dexamethasone, fluconazole, hydromorphone, iron, pantoprazole, polyethylene glycol powder, and rivaroxaban, and the following Facility-Administered Medications: sodium chloride.  SURGICAL HISTORY:  Past Surgical History  Procedure Laterality Date  . Abdominal hysterectomy  2005  . Cesarean section  1976  . Flexible sigmoidoscopy N/A 05/25/2013    Procedure: FLEXIBLE SIGMOIDOSCOPY;  Surgeon: [name], MD;  Location: [facility];  Service: Endoscopy;  Laterality: N/A;  needs floro  . Colonic stent placement N/A 05/25/2013    Procedure: COLONIC STENT PLACEMENT;  Surgeon: [name], MD;  Location: [facility];  Service: Endoscopy;  Laterality: N/A;  . Portacath placement Right 06/25/2013  Procedure: ULTRA SOUND GUIDED INSERTION PORT-A-CATH;  Surgeon: [name], MD;  Location: [facility];  Service: General;  Laterality: Right;  . Flexible sigmoidoscopy N/A 08/19/2014    Procedure: FLEXIBLE SIGMOIDOSCOPY;  Surgeon: Gatha Mayer, MD;  Location: [facility];  Service: Endoscopy;  Laterality: N/A;    . Colonic stent placement N/A 08/19/2014    Procedure: COLONIC STENT PLACEMENT;  Surgeon: Gatha Mayer, MD;  Location: [facility];  Service: Endoscopy;  Laterality: N/A;  . Flexible sigmoidoscopy N/A 08/19/2014    Procedure: FLEXIBLE SIGMOIDOSCOPY;  Surgeon: Gatha Mayer, MD;  Location: [facility];  Service: Endoscopy;  Laterality: N/A;  . Colonic stent placement N/A 08/19/2014    Procedure: COLONIC STENT PLACEMENT;  Surgeon: Gatha Mayer, MD;  Location: [facility];  Service: Endoscopy;  Laterality: N/A;  . Colon resection N/A 10/28/2014    Procedure: Transverse loop colostomy;  Surgeon: [name], MD;  Location: [facility];  Service: General;  Laterality: N/A;  . Colostomy revision N/A 01/03/2015    Procedure: REVISION OF TRANSVERSE LOOP COLOSTOMY WITH PARTIAL  COLECTOMY;  Surgeon: [name], MD;  Location: [facility];  Service: General;  Laterality: N/A;  . Laparoscopic low anterior resection  03/06/2015    03/06/2015  . Laparoscopic splenectomy  03/06/2015  . Colon resection N/A 03/06/2015    Procedure: LAPAROSCOPIC takedown loop colostomy ;  Surgeon: Michael Boston, MD;  Location: [facility];  Service: General;  Laterality: N/A;  . 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 Bowel resection  03/06/2015    Procedure: LOW ANTERIOR BOWEL RESECTION, rigid proctoscopy;  Surgeon: Michael Boston, MD;  Location: [facility];  Service: General;;  . Polypectomy  03/06/2015    Procedure: transverse POLYPECTOMY x 4;  Surgeon: Michael Boston, MD;  Location: [facility];  Service: General;;    REVIEW OF SYSTEMS:   Constitutional: Denies fevers, chills or abnormal weight loss Eyes: Denies blurriness of vision Ears, nose, mouth, throat, and face: Denies mucositis or sore throat Respiratory: Denies cough, dyspnea or wheezes Cardiovascular: Denies palpitation, chest discomfort or lower extremity  swelling Gastrointestinal:  Denies nausea, heartburn or change in bowel habits Skin: Denies abnormal skin rashes Lymphatics: Denies new lymphadenopathy or easy bruising Neurological:Denies numbness, tingling or new weaknesses Behavioral/Psych: Mood is stable, no new changes  All other systems were reviewed with the patient and are negative.  PHYSICAL EXAMINATION: ECOG PERFORMANCE STATUS: 1-2 Blood pressure 121/66, pulse 134, temperature 100 F (37.8 C), temperature source Oral, resp. rate 18, height '5\' 5"'$  (1.651 m), weight 139 lb 4.8 oz (63.186 kg), SpO2 100 %. GENERAL:alert, no distress and comfortable; well-developed, well nourished.  SKIN: skin color, texture, turgor are normal, no rashes or significant lesions; + R Port, hyperpigmentation of her hands.  EYES: normal, Conjunctiva are pink and non-injected, sclera clear  OROPHARYNX:no exudate, no erythema and lips, buccal mucosa  NECK: supple, thyroid normal size, non-tender, without nodularity  LYMPH: no palpable lymphadenopathy in the cervical, axillary or supraclavicular  LUNGS: clear to auscultation and percussion with normal breathing effort  HEART: Tachycardic with regular rhythm and no murmurs and no lower extremity edema  ABDOMEN:abdomen soft, non-tender and normal bowel sounds. Well-healed surgical scar in the right upper quadrant.  Bowel sounds normal.   Musculoskeletal:no cyanosis of digits and no clubbing  NEURO: alert & oriented x 3 with fluent speech, slightly weakness on the right  lower extremity, she is able to walk independently without any assistance EXTREMITIES: no leg swollen.  Labs:  CBC Latest Ref Rng 12/15/2015 11/27/2015 11/25/2015  WBC 3.9 - 10.3 10e3/uL 18.5(H) 8.8 10.0  Hemoglobin 11.6 - 15.9 g/dL 9.3(L) 8.2(L) 8.6(L)  Hematocrit 34.8 - 46.6 % 29.2(L) 26.0(L) 26.6(L)  Platelets 145 - 400 10e3/uL 403(H) 572(H) 498(H)   CMP Latest Ref Rng 12/15/2015 11/27/2015 11/25/2015  Glucose 70 - 140 mg/dl 107 126(H) 151(H)   BUN 7.0 - 26.0 mg/dL 22.'8 13 13  '$ Creatinine 0.6 - 1.1 mg/dL 0.8 0.54 0.51  Sodium 136 - 145 mEq/L 130(L) 138 135  Potassium 3.5 - 5.1 mEq/L 4.5 4.4 4.3  Chloride 101 - 111 mmol/L - 112(H) 106  CO2 22 - 29 mEq/L 24 20(L) 22  Calcium 8.4 - 10.4 mg/dL 13.7(HH) 10.9(H) 11.7(H)  Total Protein 6.4 - 8.3 g/dL 7.2 - 7.5  Total Bilirubin 0.20 - 1.20 mg/dL <0.30 - 0.6  Alkaline Phos 40 - 150 U/L 76 - 65  AST 5 - 34 U/L 19 - 21  ALT 0 - 55 U/L 28 - 19    PATHOLOGY REPORT Diagnosis 03/06/2015 1. Vagina, biopsy, vaginal cuff - METASTATIC ADENOCARCINOMA, SEE COMMENT. 2. Colon, polyp(s), transverse - TUBULAR ADENOMA WITH HIGH GRADE DYSPLASIA (X2), SEE COMMENT. - TUBULAR ADENOMA (X2). - NO INVASIVE CARCINOMA IDENTIFIED. 3. Colon, colostomy stoma - FINDINGS CONSISTENT WITH COLOSTOMY STOMA. - NO MALIGNANCY IDENTIFIED. 4. Colon, segmental resection for tumor, rectosigmoid cancer - INVASIVE ADENOCARCINOMA, WELL DIFFERENTIATED, SPANNING 7.6 CM. - TUMOR INVADES THROUGH SEROSA. - RESECTION MARGINS ARE NEGATIVE. - THIRTY-TWO OF THIRTY-TWO LYMPH NODES NEGATIVE FOR CARCINOMA (0/32). - SEE ONCOLOGY TABLE. 5. Colon, resection margin (donut), proximal - BENIGN COLONIC MUCOSA. - NO DYSPLASIA OR MALIGNANCY. 6. Colon, resection margin (donut), distal - BENIGN COLONIC MUCOSA. - NO DYSPLASIA OR MALIGNANCY. 7. Spleen, possible metastasis - SPLEEN WITH PLASMA CELL INFILTRATE AND FIBROSIS, SEE COMMENT. - NO METASTATIC CARCINOMA IDENTIFIED.  Pathologic Staging: ypT4a, ypN0, ypM1b   MOLECULAR FEATURES ([facility]):   RADIOGRAPHIC STUDIES: CT abdomen and pelvis with contrast 11/25/2015 IMPRESSION: 1. Recurrence of malignancy at the site of surgery at the mid sigmoid colon, with irregular mass measuring approximately 5.6 x 4.4 x 3.3 cm, surrounding soft tissue inflammation and presacral stranding, and some degree of bowel dysmotility given stool more proximally. 2. Additional mass extends  superiorly from the mid sigmoid colon along the retroperitoneum on the left side, measuring up 2.6 cm in size. 3. Numerous hepatic lesions have increased significantly in size, measuring up to 6.0 cm, with central calcification, compatible with worsening diffuse metastatic disease the liver. 4. Few small nodules at the left lung base, measuring up to 6 mm in size. These are new or more prominent than on the prior study. Metastatic disease is a concern. 5. 7.4 cm hemangioma at the right hepatic lobe is grossly stable. 6. Nonobstructing left renal stones measure up to 9 mm in size.    ASSESSMENT: [name] [name] 65 y.o. female with a history of stage IV colon adenocarcinoma, KRAS mutation(+), on capecitabine maintenance therapy..   1. 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 Now her primary rectosigmoid colon cancer has been removed, she still has multiple small liver metastasis (biopsy confirmed in December 2014), and a small peritoneal disease (positive vaginal cuff biopsy) -She is clinically doing very well, asymptomatic. Again, she is reluctant to restart chemotherapy, due to  the concern of side effects from chemotherapy. We previously discussed that early chemo is likely prolong her life more than wait and see, but she really appreciate her current quality of life more than quantity of life. After lengthy discussion again, we decided to continue observation for now -She previously tolerated 5/fu or capecitabine poorly. Single agent irinotecan with avastin (she never had before) would be next line therapy if she needs it in future. -Due to the K-ras mutation, she is not candidate for EGFR inhibitor therapy -Her tumor has stable MSI, unlikely will response to immunotherapy -We discussed the option of clinical trial, she is not interested. -She recently developed a solitary  brain metastasis, received stereotactic radiation, still on steroids. -She does not want systemic therapy, she otherwise feels well, no other complaints except occasional left hip pelvic pain. If her pain gets worse. -I reviewed her CT abdomen and pelvis from 11/25/2015 which was done in the hospital, which showed significant disease progression. She is not very symptomatically right now, we will continue supportive care, and consider palliative radiation if she is symptomatically -I previously discussed hospice with patient when she was hospitalized, she is not ready yet.  2. Brain metastasis -Status post S/P RT, CT had on 11/25/2015 showed slightly increased the size of metastatic lesion, with persistent surrounding edema. -She became symptomatically when she was weaned off steroids in May, Dr. Lisbeth Renshaw is tapering down her dexamethasone again. -She may need small maintenance dose of dexamethasone to prevent symptoms  3. PE diagnosed on 11/13/2014 -Continue Xarelto. She is tolerating well, will continue, no clinical signs of bleeding.  4. Anemia, secondary to chemo, and surgery  -improved, hemoglobin 9.3 today   -continue ferrous sulfate -She is not symptomatic, we'll hold on blood transfusion. -Continue monitoring, will repeat ferritin and iron study every 3 month, to see if she needs IV feraheme   4. Chronic Hypercalcemia, Secondary to hyperparathyroidism -she has elevated PTH and low PTH-rp -will hold on her parathyroidectomy due to her widely spread to metastatic colon cancer -Her calcium is 13.7 today, I'll arrange Zometa infusion in the next few days. -I encouraged her to drink more water to home   PLAN -RTC in 6 weeks for port flush, lab and follow up  -Zometa in the next few days -I'll ask Dr. Lisbeth Renshaw to refill her dexamethasone   All questions were answered. The patient knows to call the clinic with any problems, questions or concerns. We can certainly see the patient much sooner  if necessary.  I spent 20 minutes counseling the patient face to face. The total time spent in the appointment was 25 minutes.  Truitt Merle  12/15/2015

## 2015-12-15 NOTE — Telephone Encounter (Signed)
spoke w/ pt confirmed 6/14 apt

## 2015-12-15 NOTE — Telephone Encounter (Signed)
Gave pt apt & avs °

## 2015-12-15 NOTE — Telephone Encounter (Signed)
Per staff message and POF I have scheduled appts. Advised scheduler of appts. JMW  

## 2015-12-15 NOTE — Telephone Encounter (Signed)
Notified pt of u/a results & reminded to call if temp 100.4 or greater or any symptoms of infection.  Pt expressed understanding.

## 2015-12-15 NOTE — Telephone Encounter (Signed)
-----   Message from Truitt Merle, MD sent at 12/15/2015  3:36 PM EDT ----- [name] [name],   Please let her know that her UA was negative for UTI. Watch her temperature at home, and call us if she have fever T>100.4, thanks  Truitt Merle

## 2015-12-15 NOTE — Patient Instructions (Signed)

## 2015-12-16 ENCOUNTER — Other Ambulatory Visit: Payer: Self-pay | Admitting: Radiation Oncology

## 2015-12-16 LAB — URINE CULTURE

## 2015-12-16 MED ORDER — DEXAMETHASONE 2 MG PO TABS: ORAL_TABLET | ORAL | Status: DC

## 2015-12-17 ENCOUNTER — Other Ambulatory Visit: Payer: Self-pay | Admitting: Radiation Oncology

## 2015-12-17 ENCOUNTER — Telehealth: Payer: Self-pay | Admitting: *Deleted

## 2015-12-17 ENCOUNTER — Ambulatory Visit (HOSPITAL_BASED_OUTPATIENT_CLINIC_OR_DEPARTMENT_OTHER): Payer: Medicare Other

## 2015-12-17 DIAGNOSIS — Z95828 Presence of other vascular implants and grafts: Secondary | ICD-10-CM

## 2015-12-17 MED ORDER — ZOLEDRONIC ACID 4 MG/100ML IV SOLN
4.0000 mg | Freq: Once | INTRAVENOUS | Status: AC
  Administered 2015-12-17: 4 mg via INTRAVENOUS
  Filled 2015-12-17: qty 100

## 2015-12-17 MED ORDER — SODIUM CHLORIDE 0.9 % IJ SOLN
10.0000 mL | INTRAMUSCULAR | Status: DC | PRN
  Administered 2015-12-17: 10 mL via INTRAVENOUS
  Filled 2015-12-17: qty 10

## 2015-12-17 MED ORDER — HEPARIN SOD (PORK) LOCK FLUSH 100 UNIT/ML IV SOLN
500.0000 [IU] | Freq: Once | INTRAVENOUS | Status: AC | PRN
  Administered 2015-12-17: 500 [IU] via INTRAVENOUS
  Filled 2015-12-17: qty 5

## 2015-12-17 MED ORDER — DEXAMETHASONE 2 MG PO TABS: ORAL_TABLET | ORAL | Status: DC

## 2015-12-17 MED ORDER — SODIUM CHLORIDE 0.9 % IV SOLN
Freq: Once | INTRAVENOUS | Status: AC
  Administered 2015-12-17: 09:00:00 via INTRAVENOUS

## 2015-12-17 NOTE — Telephone Encounter (Signed)
Patient called and stated she will run out of her decadron today needs refill, she is taking 2 tabs 2 x day right now, will call her when MD  Sends in rx 12:18 PM

## 2015-12-17 NOTE — Telephone Encounter (Signed)
Called patient RX has been called in to her pharmacy with tapering instructions  To be given to her by pharmacy, patient thanked Dr. Lisbeth Renshaw and this RN for return call 12:53 PM

## 2015-12-17 NOTE — Telephone Encounter (Signed)
Called and left vm to call me at [phone] to check how she is doing and MD to taper her decadron may need refill 8:41 AM

## 2015-12-17 NOTE — Telephone Encounter (Signed)
Returned call x 2 to patient to clarify decadron taper,  Start  To take a 2mg  tablet  2x day for 1 week Then take a 2mg  tablet daily for 1 week Then take a  2mg  tablet take every other day for 1 week, then stop, patient wrote down and repeated back af=gain  3:15 PM'

## 2015-12-17 NOTE — Patient Instructions (Signed)
[facility] Discharge Instructions for Patients Receiving Chemotherapy  Today you received the following chemotherapy agents: Zometa.  To help prevent nausea and vomiting after your treatment, we encourage you to take your nausea medication.  If you develop nausea and vomiting that is not controlled by your nausea medication, call the clinic.   BELOW ARE SYMPTOMS THAT SHOULD BE REPORTED IMMEDIATELY:  *FEVER GREATER THAN 100.5 F  *CHILLS WITH OR WITHOUT FEVER  NAUSEA AND VOMITING THAT IS NOT CONTROLLED WITH YOUR NAUSEA MEDICATION  *UNUSUAL SHORTNESS OF BREATH  *UNUSUAL BRUISING OR BLEEDING  TENDERNESS IN MOUTH AND THROAT WITH OR WITHOUT PRESENCE OF ULCERS  *URINARY PROBLEMS  *BOWEL PROBLEMS  UNUSUAL RASH Items with * indicate a potential emergency and should be followed up as soon as possible.  Feel free to call the clinic you have any questions or concerns. The clinic phone number is [phone].  Please show the [facility] at check-in to the Emergency Department and triage nurse.

## 2015-12-17 NOTE — Telephone Encounter (Signed)
-----   Message from Kyung Rudd, MD sent at 12/17/2015 12:39 PM EDT ----- I've called in a prescription for her to taper her steroids. The taper instructions are in her prescription and should be given to her by the pharmacy.

## 2015-12-18 ENCOUNTER — Other Ambulatory Visit: Payer: Self-pay | Admitting: Radiation Therapy

## 2015-12-18 DIAGNOSIS — C7931 Secondary malignant neoplasm of brain: Secondary | ICD-10-CM

## 2015-12-18 DIAGNOSIS — C7949 Secondary malignant neoplasm of other parts of nervous system: Principal | ICD-10-CM

## 2016-01-02 ENCOUNTER — Ambulatory Visit
Admission: RE | Admit: 2016-01-02 | Discharge: 2016-01-02 | Disposition: A | Discharge status: Home / Self Care | Payer: Medicare Other | Source: Ambulatory Visit | Attending: Radiation Oncology | Admitting: Radiation Oncology

## 2016-01-02 DIAGNOSIS — C7949 Secondary malignant neoplasm of other parts of nervous system: Principal | ICD-10-CM

## 2016-01-02 DIAGNOSIS — C7931 Secondary malignant neoplasm of brain: Secondary | ICD-10-CM

## 2016-01-02 MED ORDER — GADOBENATE DIMEGLUMINE 529 MG/ML IV SOLN
13.0000 mL | Freq: Once | INTRAVENOUS | Status: AC | PRN
  Administered 2016-01-02: 13 mL via INTRAVENOUS

## 2016-01-02 NOTE — Progress Notes (Addendum)
S/P SRS tx to the Brain: 09/18/15  CT Scan of  Brain 01/02/16: Left Frontal -Parietal Lobe has increased in size compared to 09/15/2015 exam.  Headache:None  Ataxia:None,using a cane as needed.  ? Falls:None  Blurred Vision:Less blurred vision since lower dosage of Decadron 2 mg qod.  Fine Motor Movement:No problems Pain: Denies pain at this time Decadron/Dosage If still taking and check for Thrush:2mg  daily to stop tomorrow and has thrush now. Asked for a refill on her Dilaudid. BP 113/41 mmHg  Pulse 125  Temp(Src) 98.1 F (36.7 C) (Oral)  Resp 16  Ht 5\' 5"  (1.651 m)  Wt 144 lb (65.318 kg)  BMI 23.96 kg/m2  SpO2 100%

## 2016-01-05 ENCOUNTER — Telehealth: Payer: Self-pay | Admitting: *Deleted

## 2016-01-05 ENCOUNTER — Ambulatory Visit
Admission: RE | Admit: 2016-01-05 | Discharge: 2016-01-05 | Disposition: A | Discharge status: Home / Self Care | Payer: Medicare Other | Source: Ambulatory Visit | Attending: Radiation Oncology | Admitting: Radiation Oncology

## 2016-01-05 ENCOUNTER — Encounter: Payer: Self-pay | Admitting: Radiation Oncology

## 2016-01-05 VITALS — BP 113/41 | HR 125 | Temp 98.1°F | Resp 16 | Ht 65.0 in | Wt 144.0 lb

## 2016-01-05 DIAGNOSIS — C189 Malignant neoplasm of colon, unspecified: Secondary | ICD-10-CM

## 2016-01-05 DIAGNOSIS — Z87891 Personal history of nicotine dependence: Secondary | ICD-10-CM | POA: Insufficient documentation

## 2016-01-05 DIAGNOSIS — R109 Unspecified abdominal pain: Secondary | ICD-10-CM | POA: Insufficient documentation

## 2016-01-05 DIAGNOSIS — Z7901 Long term (current) use of anticoagulants: Secondary | ICD-10-CM | POA: Diagnosis not present

## 2016-01-05 DIAGNOSIS — C7931 Secondary malignant neoplasm of brain: Secondary | ICD-10-CM | POA: Insufficient documentation

## 2016-01-05 DIAGNOSIS — B379 Candidiasis, unspecified: Secondary | ICD-10-CM | POA: Insufficient documentation

## 2016-01-05 DIAGNOSIS — C19 Malignant neoplasm of rectosigmoid junction: Secondary | ICD-10-CM

## 2016-01-05 DIAGNOSIS — R1084 Generalized abdominal pain: Secondary | ICD-10-CM

## 2016-01-05 DIAGNOSIS — B370 Candidal stomatitis: Secondary | ICD-10-CM

## 2016-01-05 MED ORDER — FLUCONAZOLE 100 MG PO TABS: 100.0000 mg | ORAL_TABLET | Freq: Every day | ORAL | Status: DC

## 2016-01-05 MED ORDER — HYDROMORPHONE HCL 4 MG PO TABS: 4.0000 mg | ORAL_TABLET | ORAL | Status: DC | PRN

## 2016-01-05 NOTE — Telephone Encounter (Signed)
Called [name] [name] back to let her know she could pick up her prescription.

## 2016-01-05 NOTE — Progress Notes (Addendum)
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 ExacTrac Snap verification was performed for each couch angle.   Narrative:  The patient returns today for routine follow-up and MRI review. The patient was seen by Dr. Lisbeth Renshaw a few weeks ago and was started on a dexamethasone taper after a CT revealed increased edema present. She is currently on 43m daily and has plans to finish this tomorrow. Dr. FBurr Medicomet the patient on 11/25/15 to discuss hospice care, and she is not interested in proceeding with this currently. She had an MRI scan last week on 01/02/16 which revealed stability of her disease.  On review of systems, the patient reports that she is doing well overall. She denies any headaches, blurred vision, double vision, auditory changes,  chest pain, shortness of breath, cough, fevers, chills, night sweats, unintended weight changes. She has noticed some lower abdominal cramping since she saw Dr. FBurr Medico She requests a prescription for diluadid. She has had intermittent episodes more regularly of constipation and is going to start miralax tonight. She deniesbladder disturbances, and denies  nausea or vomiting. She denies any new musculoskeletal or joint aches or pains. A complete review of systems is obtained and is otherwise negative.   Past Medical History:  Past Medical History  Diagnosis Date  . [place]     dx'd 2014  . Heart murmur     ? as a child   . Pulmonary embolism ([facility] 11/2014   . Anemia     hx of   . S/P radiation therapy 09/18/15    SRS left frontoparietal 20Gy    Past Surgical History: Past Surgical  History  Procedure Laterality Date  . Abdominal hysterectomy  2005  . Cesarean section  1976  . Flexible sigmoidoscopy N/A 05/25/2013    Procedure: FLEXIBLE SIGMOIDOSCOPY;  Surgeon: DMilus Banister MD;  Location: [facility];  Service: Endoscopy;  Laterality: N/A;  needs floro  . Colonic stent placement N/A 05/25/2013    Procedure: COLONIC STENT PLACEMENT;  Surgeon: DMilus Banister MD;  Location: [facility];  Service: Endoscopy;  Laterality: N/A;  . Portacath placement Right 06/25/2013    Procedure: ULTRA SOUND GUIDED INSERTION PORT-A-CATH;  Surgeon: TOdis Hollingshead MD;  [facility]  Service: General;  Laterality: Right;  . Flexible sigmoidoscopy N/A 08/19/2014    Procedure: FLEXIBLE SIGMOIDOSCOPY;  Surgeon: CGatha Mayer MD;  [facility];  Service: Endoscopy;  Laterality: N/A;  . Colonic stent placement N/A 08/19/2014    Procedure: COLONIC STENT PLACEMENT;  Surgeon: CGatha Mayer MD;  Location: [facility];  Service: Endoscopy;  Laterality: N/A;  . Flexible sigmoidoscopy N/A 08/19/2014    Procedure: FLEXIBLE SIGMOIDOSCOPY;  Surgeon: CGatha Mayer MD;  Location: [facility];  Service: Endoscopy;  Laterality: N/A;  . Colonic stent placement N/A 08/19/2014    Procedure: COLONIC STENT PLACEMENT;  Surgeon: CGatha Mayer MD;  Location: [facility];  Service: Endoscopy;  Laterality: N/A;  . Colon resection N/A 10/28/2014    Procedure: Transverse loop colostomy;  Surgeon: TJackolyn Confer MD;  Location: [facility];  Service: General;  Laterality: N/A;  . Colostomy revision N/A 01/03/2015    Procedure: REVISION OF TRANSVERSE LOOP COLOSTOMY WITH PARTIAL  COLECTOMY;  Surgeon: [name], MD;  Location: [facility];  Service: General;  Laterality: N/A;  . Laparoscopic low anterior resection  03/06/2015    03/06/2015  . Laparoscopic splenectomy  03/06/2015  . Colon resection N/A 03/06/2015    Procedure: LAPAROSCOPIC takedown loop colostomy ;  Surgeon: Michael Boston,  MD;  Location: [facility];  Service: General;  Laterality: N/A;  . 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 Bowel resection  03/06/2015    Procedure: LOW ANTERIOR BOWEL RESECTION, rigid proctoscopy;  Surgeon: Michael Boston, MD;  Location: [facility];  Service: General;;  . Polypectomy  03/06/2015    Procedure: transverse POLYPECTOMY x 4;  Surgeon: Michael Boston, MD;  Location: [facility];  Service: General;;    Social History:  Social History   Social History  . Marital Status: Married    Spouse Name: N/A  . Number of Children: 3  . Years of Education: N/A   Occupational History  .     Social History Main Topics  . Smoking status: Former Smoker -- 1 years    Types: Cigarettes    Quit date: 05/24/1971  . Smokeless tobacco: Never Used  . Alcohol Use: No  . Drug Use: No  . Sexual Activity: Not on file   Other Topics Concern  . Not on file   Social History Narrative    Family History: Family History  Problem Relation Age of Onset  . Colon cancer Sister 2  . Diabetes Neg Hx   . Thyroid disease Neg Hx   . Breast cancer Maternal Aunt   . Prostate cancer Maternal Uncle   . Bone cancer Maternal Grandfather   . Prostate cancer Maternal Uncle     ALLERGIES:  is allergic to oxycontin and codeine.  Meds: Current Outpatient Prescriptions  Medication Sig Dispense Refill  . acetaminophen (TYLENOL) 500 MG tablet Take 500 mg by mouth every 6 (six) hours as needed for moderate pain or headache.     . cyanocobalamin 100 MCG tablet Take 100 mcg by mouth.    . dexamethasone (DECADRON) 2 MG tablet Take 9m BID for 1 week, then 2 mg daily for 1 week, then 235mevery other day for one week then stop 60 tablet 0  . IRON PO Take 1 tablet by mouth every morning.    . polyethylene glycol powder (GLYCOLAX/MIRALAX) powder TAKE 34 G (2 DOSES) BY MOUTH DAILY AS NEEDED FOR MILD CONSTIPATION OR MODERATE  CONSTIPATION 255 g 0  . rivaroxaban (XARELTO) 20 MG TABS tablet Take 1 tablet (20 mg total) by mouth daily with supper. 30 tablet 3  . fluconazole (DIFLUCAN) 100 MG tablet Take 1 tablet (100 mg total) by mouth daily. Take 2 tabs x 1, then 1 tab QD x 9 additional days. 3 tablet 0  . HYDROmorphone (DILAUDID) 4 MG tablet Take 1 tablet (4 mg total) by mouth every 4 (four) hours as needed for severe pain. 60 tablet 0  . pantoprazole (PROTONIX) 40 MG tablet Take 1 tablet (40 mg total) by mouth daily. (Patient not taking: Reported on 01/05/2016) 30 tablet 0   No current facility-administered medications for this encounter.    Physical Findings:  height is '5\' 5"'  (1.651 m) and weight is 144 lb (65.318 kg). Her oral  temperature is 98.1 F (36.7 C). Her blood pressure is 113/41 and her pulse is 125. Her respiration is 16 and oxygen saturation is 100%.  In general this is a well appearing African-American female in no acute distress. She's alert and oriented x4 and appropriate throughout the examination. Cardiopulmonary assessment is negative for acute distress and she exhibits normal effort. She is normocephalic atraumatic, EOMs are intact. Oral mucosa is evaluated and white plaquing is noted consistent with oral candida.   Lab Findings: Lab Results  Component Value Date   WBC 18.5* 12/15/2015   HGB 9.3* 12/15/2015   HCT 29.2* 12/15/2015   MCV 82.7 12/15/2015   PLT 403* 12/15/2015     Radiographic Findings: Mr Kizzie Fantasia Contrast  01/02/2016  CLINICAL DATA:  65 year old female with colon cancer and metastatic brain disease post stereotactic radiation therapy. 3 month restaging. Subsequent encounter. EXAM: MRI HEAD WITHOUT AND WITH CONTRAST TECHNIQUE: Multiplanar, multiecho pulse sequences of the brain and surrounding structures were obtained without and with intravenous contrast. CONTRAST:  56m MULTIHANCE GADOBENATE DIMEGLUMINE 529 MG/ML IV SOLN COMPARISON:  11/25/2015 CT.  09/15/2015 and 09/13/2015  MR. FINDINGS: Peripheral enhancing centrally necrotic mass medial aspect of the posterior left frontal -parietal lobe has increased in size compared to 09/15/2015 exam. This currently measures 1.4 x 1.4 x 1 cm versus prior 1.3 x 1.1 x 0.8 cm. Significant decrease (although incomplete clearance) of surrounding vasogenic edema and mass effect. The patient appears to be on steroids which may contribute to the decreased surrounding vasogenic edema. Left frontal 8 mm extra-axial enhancing lesion most suggestive of a meningioma without change. No new intracranial enhancing lesion. No acute infarct or intracranial hemorrhage. No hydrocephalus. Cervical spondylotic changes C4-5. Major intracranial vascular structures are patent. No acute orbital abnormality. IMPRESSION: Peripheral enhancing centrally necrotic mass medial aspect of the posterior left frontal -parietal lobe has increased in size compared to 09/15/2015 exam. This currently measures 1.4 x 1.4 x 1 cm versus prior 1.3 x 1.1 x 0.8 cm. Significant decrease (although incomplete clearance) of surrounding vasogenic edema and mass effect. The patient appears to be on steroids which may contribute to the decreased surrounding vasogenic edema. Left frontal 8 mm extra-axial enhancing lesion most suggestive of a meningioma without change. No new intracranial enhancing lesion. Electronically Signed   By: SGenia DelM.D.   On: 01/02/2016 13:15    Impression/Plan:  1. Stage IV colorectal cancer with brain metastasis. The patient continues to do well in terms of her functionality, and continues to remain independent. She continues to follow up with Dr. FBurr Medicowho is currently contemplating hospice care.. She is continuing to which left be followed closely as requested a prescription refill of her Dilaudid pain medicine.  We did also discuss the MRI scan results, and are pleased with her progress. Her tumor appears to be responding and was presented at conference this  morning. We would recommend that she have a repeat MRI scan in 3 months time. She is encouraged to call she has questions or concerns arise prior to that visit. She will continue her taper of Decadron and will plan to discontinue this tomorrow. 2. Abdominal pain. As above a new prestricption for Dilaudid was provided to the patient. She will continue to follow this closely with [place] Dr. FBurr Medico and is contemplating hospice care.  3. Thrush. A prescription was provided to her pharmacy for Diflucan #3 100 mg tablets, 1 tablet every other day for 3 doses. She is encouraged to  call if her symptoms have not improved by Monday of next week.    Carola Rhine, PAC

## 2016-01-05 NOTE — Telephone Encounter (Signed)
Mrs. Cota called about her Diflucan prescription asked if the drugstore could be called to correct the way she should take the medication. Spoke with Shona Simpson, P.A. To take the medication every other day three pills.  Called the [facility] and gave the directions.

## 2016-01-12 ENCOUNTER — Other Ambulatory Visit: Payer: Self-pay | Admitting: Hematology

## 2016-01-14 ENCOUNTER — Telehealth: Payer: Self-pay | Admitting: *Deleted

## 2016-01-14 NOTE — Telephone Encounter (Signed)
Patient called reporting she "went to grab a chair on Saturday (01-10-2016) and fell.  Feet and ankles were swollen moreso on the LEFT.  No redness or change in color or warmth that I can tell.  We put ice on and swelling went down Sat.  This morning I looked down after my shower and ankles swollen again.  No trouble walking it just feels funny at times then eases up.  My appetite is not good but I drink four to five 20 oz bottled waters" Advised she contact PCP or Urgent Care to evaluate for sprain, fracture etc.   "Dr. Burr Medico usually advises me on what to do.  Return number [phone].

## 2016-01-14 NOTE — Telephone Encounter (Signed)
I called pt back, she states no pain or bruise at left ankle and feet, no sure if it's ankle sprain. She is using ice bag, swelling improved some this afternoon. She will call tomorrow if she needs to be seen by our symptom management clinic.   Truitt Merle  01/14/2016

## 2016-01-16 ENCOUNTER — Telehealth: Payer: Self-pay

## 2016-01-16 ENCOUNTER — Ambulatory Visit (HOSPITAL_BASED_OUTPATIENT_CLINIC_OR_DEPARTMENT_OTHER): Payer: Medicare Other

## 2016-01-16 ENCOUNTER — Ambulatory Visit (HOSPITAL_COMMUNITY)
Admission: RE | Admit: 2016-01-16 | Discharge: 2016-01-16 | Disposition: A | Discharge status: Home / Self Care | Payer: Medicare Other | Source: Ambulatory Visit | Attending: Hematology | Admitting: Hematology

## 2016-01-16 ENCOUNTER — Inpatient Hospital Stay (HOSPITAL_COMMUNITY)
Admission: EM | Admit: 2016-01-16 | Discharge: 2016-01-18 | DRG: 438 | Disposition: A | Discharge status: Home / Self Care | Payer: Medicare Other | Attending: Internal Medicine | Admitting: Internal Medicine

## 2016-01-16 ENCOUNTER — Emergency Department (HOSPITAL_COMMUNITY): Payer: Medicare Other

## 2016-01-16 ENCOUNTER — Ambulatory Visit (HOSPITAL_BASED_OUTPATIENT_CLINIC_OR_DEPARTMENT_OTHER): Payer: Medicare Other | Admitting: Nurse Practitioner

## 2016-01-16 ENCOUNTER — Encounter (HOSPITAL_COMMUNITY): Payer: Self-pay | Admitting: *Deleted

## 2016-01-16 ENCOUNTER — Encounter: Payer: Self-pay | Admitting: Nurse Practitioner

## 2016-01-16 VITALS — BP 111/73 | HR 140 | Temp 98.4°F | Resp 18 | Ht 65.0 in | Wt 146.5 lb

## 2016-01-16 DIAGNOSIS — Z800 Family history of malignant neoplasm of digestive organs: Secondary | ICD-10-CM | POA: Diagnosis not present

## 2016-01-16 DIAGNOSIS — Z90710 Acquired absence of both cervix and uterus: Secondary | ICD-10-CM

## 2016-01-16 DIAGNOSIS — Z923 Personal history of irradiation: Secondary | ICD-10-CM | POA: Diagnosis not present

## 2016-01-16 DIAGNOSIS — E43 Unspecified severe protein-calorie malnutrition: Secondary | ICD-10-CM | POA: Diagnosis present

## 2016-01-16 DIAGNOSIS — D638 Anemia in other chronic diseases classified elsewhere: Secondary | ICD-10-CM | POA: Diagnosis not present

## 2016-01-16 DIAGNOSIS — E860 Dehydration: Secondary | ICD-10-CM | POA: Diagnosis not present

## 2016-01-16 DIAGNOSIS — Z885 Allergy status to narcotic agent status: Secondary | ICD-10-CM

## 2016-01-16 DIAGNOSIS — E871 Hypo-osmolality and hyponatremia: Secondary | ICD-10-CM | POA: Diagnosis present

## 2016-01-16 DIAGNOSIS — Z7901 Long term (current) use of anticoagulants: Secondary | ICD-10-CM | POA: Diagnosis not present

## 2016-01-16 DIAGNOSIS — C189 Malignant neoplasm of colon, unspecified: Secondary | ICD-10-CM | POA: Diagnosis not present

## 2016-01-16 DIAGNOSIS — E8809 Other disorders of plasma-protein metabolism, not elsewhere classified: Secondary | ICD-10-CM | POA: Insufficient documentation

## 2016-01-16 DIAGNOSIS — R609 Edema, unspecified: Secondary | ICD-10-CM

## 2016-01-16 DIAGNOSIS — K859 Acute pancreatitis without necrosis or infection, unspecified: Principal | ICD-10-CM | POA: Diagnosis present

## 2016-01-16 DIAGNOSIS — D649 Anemia, unspecified: Secondary | ICD-10-CM | POA: Diagnosis present

## 2016-01-16 DIAGNOSIS — C7951 Secondary malignant neoplasm of bone: Secondary | ICD-10-CM | POA: Diagnosis not present

## 2016-01-16 DIAGNOSIS — C19 Malignant neoplasm of rectosigmoid junction: Secondary | ICD-10-CM | POA: Diagnosis present

## 2016-01-16 DIAGNOSIS — R000 Tachycardia, unspecified: Secondary | ICD-10-CM | POA: Diagnosis present

## 2016-01-16 DIAGNOSIS — Z95828 Presence of other vascular implants and grafts: Secondary | ICD-10-CM

## 2016-01-16 DIAGNOSIS — Z9049 Acquired absence of other specified parts of digestive tract: Secondary | ICD-10-CM

## 2016-01-16 DIAGNOSIS — Z87891 Personal history of nicotine dependence: Secondary | ICD-10-CM | POA: Diagnosis not present

## 2016-01-16 DIAGNOSIS — I10 Essential (primary) hypertension: Secondary | ICD-10-CM | POA: Diagnosis not present

## 2016-01-16 DIAGNOSIS — C7931 Secondary malignant neoplasm of brain: Secondary | ICD-10-CM | POA: Diagnosis not present

## 2016-01-16 DIAGNOSIS — C787 Secondary malignant neoplasm of liver and intrahepatic bile duct: Secondary | ICD-10-CM

## 2016-01-16 DIAGNOSIS — E46 Unspecified protein-calorie malnutrition: Secondary | ICD-10-CM

## 2016-01-16 DIAGNOSIS — Z808 Family history of malignant neoplasm of other organs or systems: Secondary | ICD-10-CM

## 2016-01-16 DIAGNOSIS — R110 Nausea: Secondary | ICD-10-CM

## 2016-01-16 DIAGNOSIS — Z79899 Other long term (current) drug therapy: Secondary | ICD-10-CM

## 2016-01-16 DIAGNOSIS — C7800 Secondary malignant neoplasm of unspecified lung: Secondary | ICD-10-CM | POA: Diagnosis present

## 2016-01-16 DIAGNOSIS — R1032 Left lower quadrant pain: Secondary | ICD-10-CM

## 2016-01-16 DIAGNOSIS — R600 Localized edema: Secondary | ICD-10-CM

## 2016-01-16 DIAGNOSIS — Z803 Family history of malignant neoplasm of breast: Secondary | ICD-10-CM

## 2016-01-16 DIAGNOSIS — Z86711 Personal history of pulmonary embolism: Secondary | ICD-10-CM

## 2016-01-16 LAB — URINALYSIS, ROUTINE W REFLEX MICROSCOPIC
Bilirubin Urine: NEGATIVE
Glucose, UA: NEGATIVE mg/dL
Hgb urine dipstick: NEGATIVE
Ketones, ur: NEGATIVE mg/dL
Nitrite: NEGATIVE
Protein, ur: NEGATIVE mg/dL
Specific Gravity, Urine: 1.023 (ref 1.005–1.030)
pH: 6.5 (ref 5.0–8.0)

## 2016-01-16 LAB — COMPREHENSIVE METABOLIC PANEL
ALBUMIN: 1.9 g/dL — AB (ref 3.5–5.0)
ALT: 21 U/L (ref 0–55)
AST: 25 U/L (ref 5–34)
Alkaline Phosphatase: 188 U/L — ABNORMAL HIGH (ref 40–150)
Anion Gap: 12 mEq/L — ABNORMAL HIGH (ref 3–11)
BUN: 8.5 mg/dL (ref 7.0–26.0)
CALCIUM: 11.5 mg/dL — AB (ref 8.4–10.4)
CHLORIDE: 101 meq/L (ref 98–109)
CO2: 21 mEq/L — ABNORMAL LOW (ref 22–29)
CREATININE: 0.7 mg/dL (ref 0.6–1.1)
EGFR: >90 mL/min/{1.73_m2} (ref >90)
GLUCOSE: 115 mg/dL (ref 70–140)
Potassium: 4.2 mEq/L (ref 3.5–5.1)
SODIUM: 133 meq/L — AB (ref 136–145)
Total Bilirubin: <0.3 mg/dL (ref 0.20–1.20)
Total Protein: 6.8 g/dL (ref 6.4–8.3)

## 2016-01-16 LAB — CBC WITH DIFFERENTIAL/PLATELET
BASO%: 0.1 % (ref 0.0–2.0)
Basophils Absolute: 0 10*3/uL (ref 0.0–0.1)
EOS%: 0 % (ref 0.0–7.0)
Eosinophils Absolute: 0 10*3/uL (ref 0.0–0.5)
HEMATOCRIT: 14 % — AB (ref 34.8–46.6)
HEMOGLOBIN: 4.2 g/dL — AB (ref 11.6–15.9)
LYMPH#: 1.2 10*3/uL (ref 0.9–3.3)
LYMPH%: 7.3 % — ABNORMAL LOW (ref 14.0–49.7)
MCH: 22 pg — ABNORMAL LOW (ref 25.1–34.0)
MCHC: 30 g/dL — AB (ref 31.5–36.0)
MCV: 73.3 fL — ABNORMAL LOW (ref 79.5–101.0)
MONO#: 1.3 10*3/uL — AB (ref 0.1–0.9)
MONO%: 7.8 % (ref 0.0–14.0)
NEUT%: 84.8 % — ABNORMAL HIGH (ref 38.4–76.8)
NEUTROS ABS: 13.6 10*3/uL — AB (ref 1.5–6.5)
Platelets: 637 10*3/uL — ABNORMAL HIGH (ref 145–400)
RBC: 1.91 10*6/uL — ABNORMAL LOW (ref 3.70–5.45)
RDW: 19.1 % — ABNORMAL HIGH (ref 11.2–14.5)
WBC: 16.1 10*3/uL — AB (ref 3.9–10.3)
nRBC: 4 % — ABNORMAL HIGH (ref 0–0)

## 2016-01-16 LAB — URINE MICROSCOPIC-ADD ON

## 2016-01-16 LAB — LIPASE, BLOOD: Lipase: 167 U/L — ABNORMAL HIGH (ref 11–51)

## 2016-01-16 LAB — FERRITIN: Ferritin: 79 ng/ml (ref 9–269)

## 2016-01-16 LAB — IRON AND TIBC
Iron: <11 ug/dL — ABNORMAL LOW (ref 41–142)
TIBC: 209 ug/dL — ABNORMAL LOW (ref 236–444)

## 2016-01-16 LAB — OCCULT BLOOD X 1 CARD TO LAB, STOOL: Fecal Occult Bld: NEGATIVE

## 2016-01-16 LAB — PREPARE RBC (CROSSMATCH)

## 2016-01-16 MED ORDER — IOPAMIDOL (ISOVUE-300) INJECTION 61%
100.0000 mL | Freq: Once | INTRAVENOUS | Status: AC | PRN
  Administered 2016-01-16: 100 mL via INTRAVENOUS

## 2016-01-16 MED ORDER — PANTOPRAZOLE SODIUM 40 MG PO TBEC
40.0000 mg | DELAYED_RELEASE_TABLET | Freq: Once | ORAL | Status: AC
  Administered 2016-01-16: 40 mg via ORAL
  Filled 2016-01-16: qty 1

## 2016-01-16 MED ORDER — SODIUM CHLORIDE 0.9% FLUSH
10.0000 mL | INTRAVENOUS | Status: DC | PRN
Start: 2016-01-16 — End: 2016-01-18
  Administered 2016-01-17 – 2016-01-18 (×2): 10 mL
  Filled 2016-01-16 (×2): qty 40

## 2016-01-16 MED ORDER — SODIUM CHLORIDE 0.9% FLUSH
3.0000 mL | Freq: Two times a day (BID) | INTRAVENOUS | Status: DC
  Administered 2016-01-17: 3 mL via INTRAVENOUS

## 2016-01-16 MED ORDER — SODIUM CHLORIDE 0.9 % IV SOLN
INTRAVENOUS | Status: DC
  Administered 2016-01-17 – 2016-01-18 (×4): via INTRAVENOUS

## 2016-01-16 MED ORDER — SODIUM CHLORIDE 0.9 % IJ SOLN
10.0000 mL | INTRAMUSCULAR | Status: DC | PRN
  Administered 2016-01-16: 10 mL via INTRAVENOUS
  Filled 2016-01-16: qty 10

## 2016-01-16 MED ORDER — FAMOTIDINE IN NACL 20-0.9 MG/50ML-% IV SOLN
20.0000 mg | Freq: Once | INTRAVENOUS | Status: AC
  Administered 2016-01-16: 20 mg via INTRAVENOUS
  Filled 2016-01-16: qty 50

## 2016-01-16 MED ORDER — SODIUM CHLORIDE 0.9 % IV SOLN
10.0000 mL/h | Freq: Once | INTRAVENOUS | Status: AC
  Administered 2016-01-16: 10 mL/h via INTRAVENOUS

## 2016-01-16 MED ORDER — MORPHINE SULFATE (PF) 4 MG/ML IV SOLN
4.0000 mg | Freq: Once | INTRAVENOUS | Status: AC
  Administered 2016-01-16: 4 mg via INTRAVENOUS
  Filled 2016-01-16: qty 1

## 2016-01-16 MED ORDER — HYDROMORPHONE HCL 1 MG/ML IJ SOLN
1.0000 mg | INTRAMUSCULAR | Status: DC | PRN
  Administered 2016-01-17 (×3): 2 mg via INTRAVENOUS
  Administered 2016-01-17: 1 mg via INTRAVENOUS
  Administered 2016-01-17 – 2016-01-18 (×4): 2 mg via INTRAVENOUS
  Filled 2016-01-16 (×2): qty 2
  Filled 2016-01-16: qty 1
  Filled 2016-01-16 (×5): qty 2

## 2016-01-16 NOTE — Assessment & Plan Note (Signed)
Patient has a history of pulmonary embolism in the past; and continues to take Xarelto on a daily basis as directed.  Hemoglobin has markedly decreased down to 4.2 today.  Patient denies any known obvious bleeding or bruising issues.  Patient will be transported to the emergency department for further evaluation and management today.

## 2016-01-16 NOTE — Assessment & Plan Note (Signed)
Patient's most recent restaging scan revealed progression of disease.  Patient is currently undergoing Zometa infusions on a every three-month basis.  Her last Zometa infusion was 12/17/2015.  Per last chart note-Dr. Burr Medico spoke extensively to the patient regarding the possibility of palliative care and for hospice care.  Patient refused either at that time.  Patient presented to the [facility] today with complaint of progressive and more acute left lower quadrant pain.  She states that she has been taking the Dilaudid tablets every 4 hours; and her pain continues to progress.  She states that she remains nausea; but has had no vomiting.  She states she had a very large normal bowel movement just last night.  Exam today reveals bowel sounds positive in all 4 quads, and abdomen soft.  There was no obvious tenderness with palpation today.  However, patient's blood counts reveal drastic drop in hemoglobin down to 4.2 today.  Patient denies any obvious bleeding issues whatsoever.  Did not perform a rectal exam or obtain a Hemoccult stool.  Despite patient's blood hemoglobin-patient's vital signs fairly stable with tachycardia.  At last check of 119.  Reviewed all findings with Dr. Burr Medico; and she felt that patient's new onset symptoms and progressive abdominal discomfort is most likely acute progression of her disease.  She recommended consideration for blood transfusions and a CT scan to confirm.  Note: Patient stated today that she wants to remain a full code.  Patient will be transported to the emergency department for further evaluation and management.

## 2016-01-16 NOTE — Assessment & Plan Note (Signed)
Patient admits to chronic nausea and very poor appetite.  She has had minimal oral intake recently; and feels dehydrated.  Sodium was down to 133 today.  Also, it was noted that patient's heart rate was elevated on initial check up to 140; but later decreased down to 119.  Blood pressure remained stable at 110/56.  Patient will be transported to the emergency department today for further evaluation and management.

## 2016-01-16 NOTE — Progress Notes (Signed)
SYMPTOM MANAGEMENT CLINIC    Chief Complaint: Abdominal pain  HPI:  [name] [name] 65 y.o. female diagnosed with colorectal cancer; with both brain and liver metastasis.  Currently undergoing Zometa infusions only.   Patient last received chemotherapy in 2016; and received her final radiation treatments in March 2017.  She currently receives Zometa on an every three-month basis.  Her last Zometa infusion was 12/17/2015.  Patient presented to the [facility] today with complaint of nausea, poor oral intake, dehydration, and aggressive left lower quadrant abdominal discomfort.  She is also noted increased edema to her bilateral lower extremities as well.  She denies any recent fevers or chills.  She continues to take Xarelto as directed.  See further notes for details of today's visit.  Due to all of patient's issues-patient will be transported directly to the emergency department for further evaluation and management.  Brief history.  Report were called to the emergency department charge nurse; prior to the patient being transported via wheelchair to the emergency department per the [place].  Also, reviewed all findings with Dr. Burr Medico; who was in complete agreement with the plan to transfer the patient to the emergency department for further evaluation and management.  Discussion with both the patient and her family regarding option of palliative care and for sure hospice-but patient stated that she was not ready to consider those options.  Also, reviewed CODE STATUS options; and patient confirmed that she wants to remain a full code for the time being.  Patient is scheduled to return for labs, flush, and a visit on 01/26/2016.  Oncology History   Colorectal cancer, stage IV, obstructing s/p colonic diversion   Staging form: Colon and Rectum, AJCC 7th Edition     Clinical: Stage IVA (TX, N0, M1a) - Signed by Concha Norway, MD on 09/15/2013       Colorectal cancer, stage  IV, obstructing s/p stent x2, ostomy, then [facility] 03/06/2015   05/22/2013 - 05/27/2013 Hospital Admission A/w abdominal distention and constipation for 2 days CT scan of her abdomen and pelvis showed area of bowel wall thickening at the rectosigmoid junction concerning for malignancy.   05/22/2013 Imaging CT of Abdomen: bowel wall thickening with shouldering of the proximal and distal margins, involving therectosigmoid junction, supicous liver spleen mets. Colonsocopy recommended.    05/23/2013 Tumor Marker CEA 225.7   05/24/2013 Imaging MRI of abdomen: 3.7 x 4.4 cm enhancing lesion along the superior spleen, indeterminate but worrisome for metastasis.2.7 x 3.9 cm enhancing lesion in the medial segment left hepatic lobe,    05/25/2013 Pathology Results Diagnosis Rectum, biopsy, proximal - INVASIVE ADENOCARCINOMA.   05/25/2013 Procedure Flex sig: (Dr. Ardis Hughs). 5-6 cm obstructing rectosigmoid mass distal end 10 cm from anal verge; mass biopsed and then stented 9 cm long, 22 cm diameter uncovered SEM.    05/25/2013 Initial Diagnosis Colorectal cancer, stage IV   06/25/2013 Procedure R port a cath placement.   07/09/2013 - 08/20/2013 Chemotherapy FOLFOX q 2 weeks started.  Not elgible for clinical trial and bevacimab based on stent and high risk for perforation. She received a total of 4 cycles.    07/30/2013 Tumor Marker KRAS positive.  p53, APC identified. Mismatch Repair (MMR) preserved.    08/06/2013 Adverse Reaction Complains of darkening of her hands with peeling.    08/27/2013 Family History She was seen by Genetic couselor.  She declined testing today, but will call and reschedule an appointment should she decide to pursue testing.  09/07/2013 Imaging CT abdomen: Mixed response to therapy. Response to therapy of hepatic metastasis. right hepatic hemangiomas are again identified. Other too small to characterize liver lesions are felt to be similar but are indeterminate.. Enlargement of a splenic lesions    09/13/2013 - 06/10/2014 Chemotherapy Second line chemo FOLFIRI for a total of 16 cycles    09/13/2013 Tumor Marker CEA 33.5   09/13/2013 Treatment Plan Change Reviewed scans consistent with mixed response.  Switch to FOLFIRI, she received a total of 16 cycles, with a few breaks due to hospitalization and chemo holiday.    10/29/2013 Adverse Reaction Patient reports intense abdominal cramping lasting the first few days on chemotherapy.  Starting Hyoscyamine 0.125 mg q 6 hours prn.    11/15/2013 - 11/15/2013 Hospital Admission Patient presented to ED with migrated stent.  Stent retrieved by Dr. Ardis Hughs.  Imaging negative for perforation.   Discussed in conference with referral to Dr. Zella Richer made.    11/23/2013 Imaging CT Chest. 1. Stable left lower lobe 5 mm pulmonary nodule. 2. Interval increase in size splenic mass is concerning formetastatic lesion.3. Interval calcification of the left hepatic lobe metastasis. Noevidence of active residual disease by CT.   07/01/2014 Imaging CT restaging scan showed stable disease, improved liver lesion.    07/16/2014 - 10/24/2014 Chemotherapy maintenance chemo with capacitain 1065m/m2 (18015m bid started, dose decresed to 150060mid on C1D8 due to tolerance issue. Pt declined surgery. Chemo held due to hospitalization and disease progression.    08/16/2014 - 08/20/2014 Hospital Admission She was admitted for bowel obstruction from sigmoid colon mass. She declined surgery, and had sigmoid colon stent placement. Her symptoms resolved afterwards.   10/25/2014 - 10/31/2014 Hospital Admission Admitted for bowel obstruction, CT scan showed sigmoid colon tumor growth through the stent, pt underwent Transverse loop colostomy on 10/28/2014.    11/13/2014 - 11/18/2014 Hospital Admission she was admitted for transverse loop colostomy prolapse, managed conservatively. CT chest showed RLL PE, discharged home on Xarelto    01/03/2015 - 01/07/2015 Hospital Admission palliative repair of loop  colostomy prolapse with partial colectomy 01/03/15   03/05/2015 Imaging CT chest, abdomen and pelvis showed known colorectal  Them, multiple new liver metastasis,. Noticed enhancing splenic lesion continues to enlarge, nonobstructive calculus in the left renal collecting system.   03/06/2015 Surgery Laparoscopic low anterior rectosigmoid to resection, splenectomy, takedown loop colostomy, vaginal cuff peritoneal biopsy, colon polypectomyx4    03/06/2015 Pathology Results Well differentiated adenocarcinoma in rectosigmoid colon, 32 lymph nodes were negative, vaginal cuff biopsy was positive for metastatic adenocarcinoma, [place] with plasma cell infiltrate and fibrosis, no metastatic carcinoma.   09/12/2015 - 09/17/2015 Hospital Admission Pt presented with b/l leg weakness, was admitted for brain metastasis    09/13/2015 Imaging Brain MRI with and without contrast showed a 1.7 cm left frontoparietal mass with moderate vasogenic edema, consistent with solitary metastasis   09/18/2015 - 09/18/2015 Radiation Therapy SRS to solitary brain met    09/18/2015 -  Radiation Therapy Stereotactic radiation to oligo brain metastases    Review of Systems  Constitutional: Positive for chills, weight loss and malaise/fatigue. Negative for fever.  Gastrointestinal: Positive for nausea and abdominal pain. Negative for vomiting, diarrhea, constipation, blood in stool and melena.  Neurological: Positive for weakness.  All other systems reviewed and are negative.   Past Medical History  Diagnosis Date  . [place]   [place]  . Heart murmur     ? as a child   .  Pulmonary embolism ([place]) 11/2014   . Anemia     hx of   . S/P radiation therapy 09/18/15    SRS left frontoparietal 20Gy    Past Surgical History  Procedure Laterality Date  . Abdominal hysterectomy  2005  . Cesarean section  1976  . Flexible sigmoidoscopy N/A 05/25/2013    Procedure: FLEXIBLE SIGMOIDOSCOPY;  Surgeon: [name], MD;  Location:  [facility];  Service: Endoscopy;  Laterality: N/A;  needs floro  . Colonic stent placement N/A 05/25/2013    Procedure: COLONIC STENT PLACEMENT;  Surgeon: [name], MD;  Location: [facility];  Service: Endoscopy;  Laterality: N/A;  . Portacath placement Right 06/25/2013    Procedure: ULTRA SOUND GUIDED INSERTION PORT-A-CATH;  Surgeon: [name], MD;  Location: [facility];  Service: General;  Laterality: Right;  . Flexible sigmoidoscopy N/A 08/19/2014    Procedure: FLEXIBLE SIGMOIDOSCOPY;  Surgeon: Gatha Mayer, MD;  Location: [facility];  Service: Endoscopy;  Laterality: N/A;  . Colonic stent placement N/A 08/19/2014    Procedure: COLONIC STENT PLACEMENT;  Surgeon: Gatha Mayer, MD;  Location: [facility];  Service: Endoscopy;  Laterality: N/A;  . Flexible sigmoidoscopy N/A 08/19/2014    Procedure: FLEXIBLE SIGMOIDOSCOPY;  Surgeon: Gatha Mayer, MD;  Location: [facility];  Service: Endoscopy;  Laterality: N/A;  . Colonic stent placement N/A 08/19/2014    Procedure: COLONIC STENT PLACEMENT;  Surgeon: Gatha Mayer, MD;  Location: [facility];  Service: Endoscopy;  Laterality: N/A;  . Colon resection N/A 10/28/2014    Procedure: Transverse loop colostomy;  Surgeon: [name], MD;  Location: [facility];  Service: General;  Laterality: N/A;  . Colostomy revision N/A 01/03/2015    Procedure: REVISION OF TRANSVERSE LOOP COLOSTOMY WITH PARTIAL  COLECTOMY;  Surgeon: [name], MD;  Location: [facility];  Service: General;  Laterality: N/A;  . Laparoscopic low anterior resection  03/06/2015    03/06/2015  . Laparoscopic splenectomy  03/06/2015  . Colon resection N/A 03/06/2015    Procedure: LAPAROSCOPIC takedown loop colostomy ;  Surgeon: Michael Boston, MD;  Location: [facility];  Service: General;  Laterality: N/A;  . 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 Bowel  resection  03/06/2015    Procedure: LOW ANTERIOR BOWEL RESECTION, rigid proctoscopy;  Surgeon: Michael Boston, MD;  Location: [facility];  Service: General;;  . 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; Pulmonary embolism ([place]); Long-term (current) use of anticoagulants; Parastomal hernia; Brain metastases ([place]); Right leg weakness; Metastatic colon cancer to liver ([place]); Anemia of chronic disease; Chronic pulmonary embolism ([place]); Ataxia; Abnormality of gait; Hemiparesis ([place]); Acute septic pulmonary embolism without acute cor pulmonale ([place]); Benign essential HTN; Recurrent aphthous stomatitis; Absolute anemia; Hypercalcemia of malignancy; Hyponatremia; Right sided weakness; Protein-calorie malnutrition, moderate ([place]); Pressure ulcer; Port catheter in place; Abdominal pain; 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 [facility]); Nausea without vomiting; Dehydration; Hypoalbuminemia due to protein-calorie malnutrition ([place]); Abdominal pain, acute, left lower quadrant; and Peripheral edema on her problem list.    is allergic to oxycontin and codeine.  Medication List       This list is accurate as of: 01/16/16  2:31 PM.  Always use your most recent med list.               acetaminophen 500 MG tablet  Commonly known as:  TYLENOL  Take 500 mg by mouth every 6 (six) hours as needed for moderate pain or headache.     cyanocobalamin 100 MCG tablet  Take 100 mcg by mouth. Reported on 01/16/2016     dexamethasone 2 MG tablet  Commonly known as:  DECADRON  Take 35m BID for 1 week, then 2 mg daily for 1 week, then 236mevery other day for one week then stop     fluconazole 100 MG tablet  Commonly  known as:  DIFLUCAN  Take 1 tablet (100 mg total) by mouth daily. Take 2 tabs x 1, then 1 tab QD x 9 additional days.     HYDROmorphone 4 MG tablet  Commonly known as:  DILAUDID  Take 1 tablet (4 mg total) by mouth every 4 (four) hours as needed for severe pain.     IRON PO  Take 1 tablet by mouth every morning.     pantoprazole 40 MG tablet  Commonly known as:  PROTONIX  Take 1 tablet (40 mg total) by mouth daily.     polyethylene glycol powder powder  Commonly known as:  GLYCOLAX/MIRALAX  TAKE 34 G (2 DOSES) BY MOUTH DAILY AS NEEDED FOR MILD CONSTIPATION OR MODERATE CONSTIPATION     XARELTO 20 MG Tabs tablet  Generic drug:  rivaroxaban  TAKE 1 TABLET BY MOUTH EVERY DAY WITH SUPPER         PHYSICAL EXAMINATION  Oncology Vitals 01/16/2016 01/16/2016  Height - -  Weight - -  Weight (lbs) - -  BMI (kg/m2) - -  Temp - -  Pulse 114 115  Resp 16 18  SpO2 97 100  BSA (m2) - -   BP Readings from Last 2 Encounters:  01/16/16 128/64  01/16/16 111/73    Physical Exam  Constitutional: She is oriented to person, place, and time. She appears dehydrated. She appears unhealthy. She has a sickly appearance. She appears distressed.  HENT:  Head: Normocephalic and atraumatic.  Mouth/Throat: Oropharynx is clear and moist.  Eyes: Conjunctivae and EOM are normal. Pupils are equal, round, and reactive to light. Right eye exhibits no discharge. Left eye exhibits no discharge. No scleral icterus.  Neck: Normal range of motion. Neck supple. No JVD present. No tracheal deviation present. No thyromegaly present.  Cardiovascular: Normal heart sounds and intact distal pulses.   Tachycardia  Pulmonary/Chest: Effort normal and breath sounds normal. No respiratory distress. She has no wheezes. She has no rales. She exhibits no tenderness.  Abdominal: Soft. She exhibits no distension and no mass. There is no tenderness. There is no rebound and no guarding.  Musculoskeletal: Normal range of motion.  She exhibits edema. She exhibits no tenderness.  +2 edema to bilateral lower extremities; with the left slightly greater than the right  Lymphadenopathy:    She has no cervical adenopathy.  Neurological: She is alert and oriented to person, place, and time. Gait normal.  Skin: Skin is warm and dry. No rash noted. No erythema. No pallor.  Psychiatric: Affect normal.  Nursing note and vitals reviewed.   LABORATORY DATA:. Admission on 01/16/2016  Component Date Value Ref Range Status  . Order Confirmation 01/16/2016 ORDER PROCESSED BY BLOOD BANK  Final  . ABO/RH(D) 01/16/2016 AB POS   Final  . Antibody Screen 01/16/2016 NEG   Final  . Sample Expiration 01/16/2016 01/19/2016   Final  . Unit Number 01/16/2016 [id number]   Final  . Blood Component Type 01/16/2016 RED CELLS,LR   Final  . Unit division 01/16/2016 00   Final  . Status of Unit 01/16/2016 ALLOCATED   Final  . Transfusion Status 01/16/2016 OK TO TRANSFUSE   Final  . Crossmatch Result 01/16/2016 Compatible   Final  . Unit Number 01/16/2016 [id number]   Final  . Blood Component Type 01/16/2016 RED CELLS,LR   Final  . Unit division 01/16/2016 00   Final  . Status of Unit 01/16/2016 ALLOCATED   Final  . Transfusion Status 01/16/2016 OK TO TRANSFUSE   Final  . Crossmatch Result 01/16/2016 Compatible   Final  . Unit Number 01/16/2016 [id number]   Final  . Blood Component Type 01/16/2016 RED CELLS,LR   Final  . Unit division 01/16/2016 00   Final  . Status of Unit 01/16/2016 ALLOCATED   Final  . Transfusion Status 01/16/2016 OK TO TRANSFUSE   Final  . Crossmatch Result 01/16/2016 Compatible   Final  Appointment on 01/16/2016  Component Date Value Ref Range Status  . Ferritin 01/16/2016 79  9 - 269 ng/ml Final  . Iron 01/16/2016 <11* 41 - 142 ug/dL Final  . TIBC 17/84/3332 209* 236 - 444 ug/dL Final  . UIBC 74/57/9396 Not calculated due to Iron < linear range  120 - 384 ug/dL Final  . %SAT 50/52/5798 Not calculated due  to Iron < linear range.  21 - 57 % Final  . WBC 01/16/2016 16.1* 3.9 - 10.3 10e3/uL Final  . NEUT# 01/16/2016 13.6* 1.5 - 6.5 10e3/uL Final  . HGB 01/16/2016 4.2* 11.6 - 15.9 g/dL Final  . HCT 87/67/8118 14.0* 34.8 - 46.6 % Final  . Platelets 01/16/2016 637* 145 - 400 10e3/uL Final  . MCV 01/16/2016 73.3* 79.5 - 101.0 fL Final  . MCH 01/16/2016 22.0* 25.1 - 34.0 pg Final  . MCHC 01/16/2016 30.0* 31.5 - 36.0 g/dL Final  . RBC 37/40/0214 1.91* 3.70 - 5.45 10e6/uL Final  . RDW 01/16/2016 19.1* 11.2 - 14.5 % Final  . lymph# 01/16/2016 1.2  0.9 - 3.3 10e3/uL Final  . MONO# 01/16/2016 1.3* 0.1 - 0.9 10e3/uL Final  . Eosinophils Absolute 01/16/2016 0.0  0.0 - 0.5 10e3/uL Final  . Basophils Absolute 01/16/2016 0.0  0.0 - 0.1 10e3/uL Final  . NEUT% 01/16/2016 84.8* 38.4 - 76.8 % Final  . LYMPH% 01/16/2016 7.3* 14.0 - 49.7 % Final  . MONO% 01/16/2016 7.8  0.0 - 14.0 % Final  . EOS% 01/16/2016 0.0  0.0 - 7.0 % Final  . BASO% 01/16/2016 0.1  0.0 - 2.0 % Final  . nRBC 01/16/2016 4* 0 - 0 % Final  . Sodium 01/16/2016 133* 136 - 145 mEq/L Final  . Potassium 01/16/2016 4.2  3.5 - 5.1 mEq/L Final  . Chloride 01/16/2016 101  98 - 109 mEq/L Final  . CO2 01/16/2016 21* 22 - 29 mEq/L Final  . Glucose 01/16/2016 115  70 - 140 mg/dl Final   Glucose reference range is for nonfasting patients. Fasting glucose reference range is 70- 100.  Marland Kitchen BUN 01/16/2016 8.5  7.0 - 26.0 mg/dL Final  . Creatinine 06/91/4588 0.7  0.6 - 1.1 mg/dL Final  . Total Bilirubin 01/16/2016 <0.30  0.20 - 1.20 mg/dL Final  . Alkaline Phosphatase 01/16/2016  188* 40 - 150 U/L Final  . AST 01/16/2016 25  5 - 34 U/L Final  . ALT 01/16/2016 21  0 - 55 U/L Final  . Total Protein 01/16/2016 6.8  6.4 - 8.3 g/dL Final  . Albumin 01/16/2016 1.9* 3.5 - 5.0 g/dL Final  . Calcium 01/16/2016 11.5* 8.4 - 10.4 mg/dL Final  . Anion Gap 01/16/2016 12* 3 - 11 mEq/L Final  . EGFR 01/16/2016 >90  >90 ml/min/1.73 m2 Final   eGFR is calculated using the  CKD-EPI Creatinine Equation (2009)    RADIOGRAPHIC STUDIES: Ct Abdomen Pelvis W Contrast  01/16/2016  CLINICAL DATA:  Metastatic colon cancer, anemia, constipation EXAM: CT ABDOMEN AND PELVIS WITH CONTRAST TECHNIQUE: Multidetector CT imaging of the abdomen and pelvis was performed using the standard protocol following bolus administration of intravenous contrast. CONTRAST:  125m ISOVUE-300 IOPAMIDOL (ISOVUE-300) INJECTION 61% COMPARISON:  11/25/2015 FINDINGS: Lower chest:  Bilateral pulmonary nodules, including: --5 mm left lower lobe nodule (series 4/ image 13), unchanged --5 mm right lower lobe nodule (series 4/ image 21), new --9 mm nodule at the left lung base (series 4/ image 44), previously 6 mm Hepatobiliary: Progression of numerous widespread/multifocal hepatic metastases. Index lesion in the medial segment left hepatic lobe measures 5.6 x 7.7 cm (series 2/ image 26), previously 4.5 x 6.0 cm. Gallbladder is unremarkable. No intrahepatic or extrahepatic ductal dilatation. Pancreas: Fluid/ stranding adjacent to the pancreatic tail (series 2/ image 24), with possible additional stranding along the uncinate process and pancreaticoduodenal groove (series 2/ image 45), correlate for acute pancreatitis. Spleen: Surgically absent. Splenule in the left upper abdomen (series 2/ image 26). Adrenals/Urinary Tract: Adrenal glands are within normal limits. Right kidney is unremarkable, noting an extrarenal pelvis. Left kidney is notable for two nonobstructing calculi measuring up to 7 mm in the lower pole (series 2/image 40). No ureteral or bladder calculi.  No hydronephrosis. Bladder is within normal limits. Stomach/Bowel: Stomach is within normal limits. Inflammatory changes along the proximal duodenum (series 2/ image 46), suggesting duodenitis, although its possibly secondary to pancreatic inflammatory changes. No evidence of bowel obstruction. Normal appendix (series 2/ image 54). Status post partial colectomy  with suture line in the mid abdomen (series 2/ image 48). Status post partial left hemicolectomy with suture line in the left pelvis (series 2/image 34). Associated 4.1 x 2.9 cm soft tissue mass at the surgical margin (series 2/image 73), previously 4.4 x 3.3 cm, stable versus mildly decreased. Extension posteriorly into the presacral region, where it measures 2.7 x 4.1 cm (series 62/image 70), previously 4.1 x 2.8 cm, unchanged. Extension into the pericolonic soft tissues with associated 3.1 x 2.2 cm soft tissue implant/node (series 2/image 36), previously 3.2 x 2.2 cm, unchanged. Vascular/Lymphatic: No evidence of abdominal aortic aneurysm. Additional 1.8 x 2.3 cm soft tissue implant/left common iliac node (series 2/image 63), previously 2.6 x 2.0 cm, stable versus mildly decreased. Reproductive: Status post hysterectomy. Other: Small volume of pelvic ascites, increased. Musculoskeletal: Degenerative changes of the visualized thoracolumbar spine, most prominent at L5-S1. IMPRESSION: Peripancreatic fluid/inflammatory changes, correlate for acute pancreatitis. Additional inflammatory changes involving the proximal duodenum, suggesting duodenitis, possibly secondary. Status post left hemicolectomy. Associated 4.1 cm soft tissue mass at the surgical margin, stable versus mildly increased. Mass extends posteriorly into the presacral space. Adjacent pericolonic extension anteriorly/inferiorly. Associated 2.3 cm soft tissue implant/left common iliac node, mildly decreased. Progression of multifocal hepatic metastases, including a dominant 7.7 cm mass in the left hepatic lobe, increased. Progression of small  pulmonary metastases in the bilateral lower lobes, measuring up to 9 mm. Additional ancillary findings as above. Electronically Signed   By: Julian Hy M.D.   On: 01/16/2016 17:24    ASSESSMENT/PLAN:    Peripheral edema Patient states that she tripped and fell this past weekend; and within 24 hours.  She  noted that both of her feet or edematous.  She denies any known injury or trauma specifically to her lower extremities or feet with the fall.  She also denies any loss of consciousness with fall.  She states that she has no calf pain.  She also denies any chest pain, chest pressure, shortness of breath, or pain with inspiration.  Exam today reveals +2 edema to bilateral lower extremities; with no calf tenderness.  It does appear that the left lower extremity is slightly larger than the right.  Patient will be transported to the emergency department for further evaluation and management.  Differential diagnosis for new onset peripheral edema would include need to rule out DVT; despite continued use of Xarelto oral therapy.  Also, high possibility that patient's new onset lower extremity edema is secondary to progressive disease.  Nausea without vomiting Patient states she's been experiencing increasing, intermittent nausea; but no vomiting.  She states that she has not taken any nausea medications for treatment of the nausea.  As of yet.  She has also had decreased appetite and feels dehydrated today.  Long-term (current) use of anticoagulants Patient has a history of pulmonary embolism in the past; and continues to take Xarelto on a daily basis as directed.  Hemoglobin has markedly decreased down to 4.2 today.  Patient denies any known obvious bleeding or bruising issues.  Patient will be transported to the emergency department for further evaluation and management today.  Hypoalbuminemia due to protein-calorie malnutrition (HCC) Albumin has also markedly decreased from 3.4 down to 1.9.  Patient states she's had some chronic nausea and decreased appetite with subsequent poor oral intake and dehydration.  Patient was encourage to push protein in her diet is much as possible.  Hypercalcemia of malignancy Corrected calcium was 13.2 today.  Patient is complaining of increased fatigue and generalized  weakness; but denies any neurological changes.  Most likely, hypercalcemia secondary to disease progression.  Patient will be transported to the emergency department today for further evaluation and management.  Dehydration Patient admits to chronic nausea and very poor appetite.  She has had minimal oral intake recently; and feels dehydrated.  Sodium was down to 133 today.  Also, it was noted that patient's heart rate was elevated on initial check up to 140; but later decreased down to 119.  Blood pressure remained stable at 110/56.  Patient will be transported to the emergency department today for further evaluation and management.  Colorectal cancer, stage IV, obstructing s/p stent x2, ostomy, then LAR 03/06/2015 Patient last received chemotherapy in 2016; and received her final radiation treatments in March 2017.  She currently receives Zometa on an every three-month basis.  Her last Zometa infusion was 12/17/2015.  Patient presented to the [facility] today with complaint of nausea, poor oral intake, dehydration, and aggressive left lower quadrant abdominal discomfort.  She is also noted increased edema to her bilateral lower extremities as well.  She denies any recent fevers or chills.  She continues to take Xarelto as directed.  See further notes for details of today's visit.  Due to all of patient's issues-patient will be transported directly to the emergency department for  further evaluation and management.  Brief history.  Report were called to the emergency department charge nurse; prior to the patient being transported via wheelchair to the emergency department per the [facility].  Also, reviewed all findings with Dr. Burr Medico; who was in complete agreement with the plan to transfer the patient to the emergency department for further evaluation and management.  Discussion with both the patient and her family regarding option of palliative care and for sure hospice-but patient  stated that she was not ready to consider those options.  Also, reviewed CODE STATUS options; and patient confirmed that she wants to remain a full code for the time being.  Patient is scheduled to return for labs, flush, and a visit on 01/26/2016.    Abdominal pain, acute, left lower quadrant Patient's most recent restaging scan revealed progression of disease.  Patient is currently undergoing Zometa infusions on a every three-month basis.  Her last Zometa infusion was 12/17/2015.  Per last chart note-Dr. Burr Medico spoke extensively to the patient regarding the possibility of palliative care and for hospice care.  Patient refused either at that time.  Patient presented to the [facility] today with complaint of progressive and more acute left lower quadrant pain.  She states that she has been taking the Dilaudid tablets every 4 hours; and her pain continues to progress.  She states that she remains nausea; but has had no vomiting.  She states she had a very large normal bowel movement just last night.  Exam today reveals bowel sounds positive in all 4 quads, and abdomen soft.  There was no obvious tenderness with palpation today.  However, patient's blood counts reveal drastic drop in hemoglobin down to 4.2 today.  Patient denies any obvious bleeding issues whatsoever.  Did not perform a rectal exam or obtain a Hemoccult stool.  Despite patient's blood hemoglobin-patient's vital signs fairly stable with tachycardia.  At last check of 119.  Reviewed all findings with Dr. Burr Medico; and she felt that patient's new onset symptoms and progressive abdominal discomfort is most likely acute progression of her disease.  She recommended consideration for blood transfusions and a CT scan to confirm.  Note: Patient stated today that she wants to remain a full code.  Patient will be transported to the emergency department for further evaluation and management.    Patient stated understanding of all  instructions; and was in agreement with this plan of care. The patient knows to call the clinic with any problems, questions or concerns.   Total time spent with patient was 40 minutes;  with greater than 75 percent of that time spent in face to face counseling regarding patient's symptoms,  and coordination of care and follow up.  Disclaimer:This dictation was prepared with Dragon/digital dictation along with [employer]. Any transcriptional errors that result from this process are unintentional.  Drue Second, NP 01/16/2016

## 2016-01-16 NOTE — Assessment & Plan Note (Signed)
Patient states that she tripped and fell this past weekend; and within 24 hours.  She noted that both of her feet or edematous.  She denies any known injury or trauma specifically to her lower extremities or feet with the fall.  She also denies any loss of consciousness with fall.  She states that she has no calf pain.  She also denies any chest pain, chest pressure, shortness of breath, or pain with inspiration.  Exam today reveals +2 edema to bilateral lower extremities; with no calf tenderness.  It does appear that the left lower extremity is slightly larger than the right.  Patient will be transported to the emergency department for further evaluation and management.  Differential diagnosis for new onset peripheral edema would include need to rule out DVT; despite continued use of Xarelto oral therapy.  Also, high possibility that patient's new onset lower extremity edema is secondary to progressive disease.

## 2016-01-16 NOTE — H&P (Addendum)
History and Physical    [name] [name] [id number] DOB: September 09, 1950 DOA: 01/16/2016  PCP: Kristine Garbe, MD  Outpatient Specialists: Dr. Burr Medico, Oncology Patient coming from: home  Chief Complaint: Abdominal pain  Patient wishes only certain family members to know her medical problems (husband or 2 daughters only), no other family members or friends  HPI: [name] [name] is a 65 y.o. female with medical history significant of colon cancer diagnosed in 2014 status post colectomy, stent placement, as well as chemotherapy finished in 2016 and final radiation treatment in 2017 in March, chronic hypercalcemia of malignancy receiving Zometa every 3 months, most recent one was about a month ago. Patient presented to the emergency room brought in from the [facility] with severe left lower quadrant abdominal pain, nausea, dehydration, and weakness. She has had poor by mouth intake over the last several days. She also has noticed bilateral lower extremity swelling, left more than the right. She denies any fever or chills. She denies any chest pain, she denies any palpitations. She endorses generalized weakness however denies any focal weakness. She has no blood in her stool or blood in her urine. She denies any dark tarry stools.  ED Course: In the ED, patient was found to have a hemoglobin of 4.2, went a CT scan of the abdomen and pelvis which showed peripancreatic fluid/and further changes,? Acute pancreatitis, as well as what appears to be extension of her metastatic disease with progression of hepatic metastasis, progression of pulmonary metastasis and possible increase soft tissue mass at the surgical margin where she had her left hemicolectomy. TRH was asked for admission for symptomatically anemia and poorly controlled pain.  Review of Systems: As per HPI otherwise 10 point review of systems negative.   Past Medical History  Diagnosis Date  . Colon cancer ([facility])     dx'd 2014  . Heart murmur      ? as a child   . Pulmonary embolism ([facility]) 11/2014   . Anemia     hx of   . S/P radiation therapy 09/18/15    SRS left frontoparietal 20Gy    Past Surgical History  Procedure Laterality Date  . Abdominal hysterectomy  2005  . Cesarean section  1976  . Flexible sigmoidoscopy N/A 05/25/2013    Procedure: FLEXIBLE SIGMOIDOSCOPY;  Surgeon: [name], MD;  Location: [facility];  Service: Endoscopy;  Laterality: N/A;  needs floro  . Colonic stent placement N/A 05/25/2013    Procedure: COLONIC STENT PLACEMENT;  Surgeon: [name], MD;  Location: [facility];  Service: Endoscopy;  Laterality: N/A;  . Portacath placement Right 06/25/2013    Procedure: ULTRA SOUND GUIDED INSERTION PORT-A-CATH;  Surgeon: [name], MD;  Location: [facility];  Service: General;  Laterality: Right;  . Flexible sigmoidoscopy N/A 08/19/2014    Procedure: FLEXIBLE SIGMOIDOSCOPY;  Surgeon: Gatha Mayer, MD;  Location: [facility];  Service: Endoscopy;  Laterality: N/A;  . Colonic stent placement N/A 08/19/2014    Procedure: COLONIC STENT PLACEMENT;  Surgeon: Gatha Mayer, MD;  Location: [facility];  Service: Endoscopy;  Laterality: N/A;  . Flexible sigmoidoscopy N/A 08/19/2014    Procedure: FLEXIBLE SIGMOIDOSCOPY;  Surgeon: Gatha Mayer, MD;  Location: [facility];  Service: Endoscopy;  Laterality: N/A;  . Colonic stent placement N/A 08/19/2014    Procedure: COLONIC STENT PLACEMENT;  Surgeon: Gatha Mayer, MD;  Location: [facility];  Service: Endoscopy;  Laterality: N/A;  . Colon resection N/A 10/28/2014  Procedure: Transverse loop colostomy;  Surgeon: [name], MD;  Location: [facility];  Service: General;  Laterality: N/A;  . Colostomy revision N/A 01/03/2015    Procedure: REVISION OF TRANSVERSE LOOP COLOSTOMY WITH PARTIAL  COLECTOMY;  Surgeon: [name], MD;  Location: [facility];  Service: General;  Laterality: N/A;  . Laparoscopic low anterior resection  03/06/2015     03/06/2015  . Laparoscopic splenectomy  03/06/2015  . Colon resection N/A 03/06/2015    Procedure: LAPAROSCOPIC takedown loop colostomy ;  Surgeon: Michael Boston, MD;  Location: [facility];  Service: General;  Laterality: N/A;  . 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 Bowel resection  03/06/2015    Procedure: LOW ANTERIOR BOWEL RESECTION, rigid proctoscopy;  Surgeon: Michael Boston, MD;  Location: [facility];  Service: General;;  . Polypectomy  03/06/2015    Procedure: transverse POLYPECTOMY x 4;  Surgeon: Michael Boston, MD;  Location: [facility];  Service: General;;     reports that she quit smoking about 44 years ago. Her smoking use included Cigarettes. She quit after 1 year of use. She has never used smokeless tobacco. She reports that she does not drink alcohol or use illicit drugs.  Allergies  Allergen Reactions  . Oxycontin [Oxycodone Hcl] Anaphylaxis, Hives and Itching  . Codeine Itching and Nausea And Vomiting    Family History  Problem Relation Age of Onset  . Colon cancer Sister 75  . Diabetes Neg Hx   . Thyroid disease Neg Hx   . Breast cancer Maternal Aunt   . Prostate cancer Maternal Uncle   . Bone cancer Maternal Grandfather   . Prostate cancer Maternal Uncle     Prior to Admission medications   Medication Sig Start Date End Date Taking? Authorizing Provider  acetaminophen (TYLENOL) 500 MG tablet Take 500 mg by mouth every 6 (six) hours as needed for moderate pain or headache.    Yes Historical Provider, MD  HYDROmorphone (DILAUDID) 4 MG tablet Take 1 tablet (4 mg total) by mouth every 4 (four) hours as needed for severe pain. 01/05/16  Yes Hayden Pedro, PA-C  IRON PO Take 1 tablet by mouth every morning.   Yes Historical Provider, MD  polyethylene glycol powder (GLYCOLAX/MIRALAX) powder TAKE 34 G (2 DOSES) BY MOUTH DAILY AS NEEDED FOR MILD CONSTIPATION OR MODERATE  CONSTIPATION 03/03/14  Yes Concha Norway, MD  vitamin B-12 (CYANOCOBALAMIN) 500 MCG tablet Take 500 mcg by mouth daily.   Yes Historical Provider, MD  XARELTO 20 MG TABS tablet TAKE 1 TABLET BY MOUTH EVERY DAY WITH SUPPER 01/14/16  Yes Truitt Merle, MD  dexamethasone (DECADRON) 2 MG tablet Take 2mg  BID for 1 week, then 2 mg daily for 1 week, then 2mg  every other day for one week then stop Patient not taking: Reported on 01/16/2016 12/17/15   Kyung Rudd, MD  fluconazole (DIFLUCAN) 100 MG tablet Take 1 tablet (100 mg total) by mouth daily. Take 2 tabs x 1, then 1 tab QD x 9 additional days. Patient not taking: Reported on 01/16/2016 01/05/16   Hayden Pedro, PA-C  pantoprazole (PROTONIX) 40 MG tablet Take 1 tablet (40 mg total) by mouth daily. Patient not taking: Reported on 01/16/2016 12/02/15   Cathlyn Parsons, PA-C    Physical Exam: Filed Vitals:   01/16/16 1438 01/16/16 1616 01/16/16 1718  BP: 128/72 134/70 128/64  Pulse: 120 115  114  Temp: 98.6 F (37 C)    TempSrc: Oral    Resp: 18 18 16   SpO2: 100% 100% 97%      Constitutional: Appears in pain Filed Vitals:   01/16/16 1438 01/16/16 1616 01/16/16 1718  BP: 128/72 134/70 128/64  Pulse: 120 115 114  Temp: 98.6 F (37 C)    TempSrc: Oral    Resp: 18 18 16   SpO2: 100% 100% 97%   Eyes: PERRL, lids and conjunctivae normal ENMT: Mucous membranes are moist. Respiratory: clear to auscultation bilaterally, no wheezing, no crackles. Normal respiratory effort. No accessory muscle use.  Cardiovascular: Regular rate and rhythm, no murmurs / rubs / gallops. 1+ Lower extremity edema. 2+ pedal pulses. Abdomen: Diffuse tenderness throughout, mostly epigastric and left lower quadrant. Bowel sounds positive. Musculoskeletal: no clubbing / cyanosis.  Neurologic: Nonfocal  Psychiatric: Normal judgment and insight. Alert and oriented x 3. Normal mood.   Labs on Admission: I have personally reviewed following labs and imaging  studies  CBC:  Recent Labs Lab 01/16/16 1153  WBC 16.1*  NEUTROABS 13.6*  HGB 4.2*  HCT 14.0*  MCV 73.3*  PLT [id number]*   Basic Metabolic Panel:  Recent Labs Lab 01/16/16 1154  NA 133*  K 4.2  CO2 21*  GLUCOSE 115  BUN 8.5  CREATININE 0.7  CALCIUM 11.5*   GFR: Estimated Creatinine Clearance: 64.8 mL/min (by C-G formula based on Cr of 0.7). Liver Function Tests:  Recent Labs Lab 01/16/16 1154  AST 25  ALT 21  ALKPHOS 188*  BILITOT <0.30  PROT 6.8  ALBUMIN 1.9*   No results for input(s): LIPASE, AMYLASE in the last 168 hours. No results for input(s): AMMONIA in the last 168 hours. Coagulation Profile: No results for input(s): INR, PROTIME in the last 168 hours. Cardiac Enzymes: No results for input(s): CKTOTAL, CKMB, CKMBINDEX, TROPONINI in the last 168 hours. BNP (last 3 results) No results for input(s): PROBNP in the last 8760 hours. HbA1C: No results for input(s): HGBA1C in the last 72 hours. CBG: No results for input(s): GLUCAP in the last 168 hours. Lipid Profile: No results for input(s): CHOL, HDL, LDLCALC, TRIG, CHOLHDL, LDLDIRECT in the last 72 hours. Thyroid Function Tests: No results for input(s): TSH, T4TOTAL, FREET4, T3FREE, THYROIDAB in the last 72 hours. Anemia Panel:  Recent Labs  01/16/16 1154  FERRITIN 79  TIBC 209*  IRON <11*   Urine analysis:    Component Value Date/Time   COLORURINE YELLOW 11/25/2015 0100   APPEARANCEUR CLEAR 11/25/2015 0100   LABSPEC 1.020 12/15/2015 1430   LABSPEC 1.012 11/25/2015 0100   PHURINE 6.0 12/15/2015 1430   PHURINE 7.0 11/25/2015 0100   GLUCOSEU Negative 12/15/2015 1430   GLUCOSEU NEGATIVE 11/25/2015 0100   HGBUR Moderate 12/15/2015 1430   HGBUR TRACE* 11/25/2015 0100   BILIRUBINUR Negative 12/15/2015 1430   BILIRUBINUR NEGATIVE 11/25/2015 0100   KETONESUR Negative 12/15/2015 1430   KETONESUR NEGATIVE 11/25/2015 0100   PROTEINUR Negative 12/15/2015 1430   PROTEINUR NEGATIVE 11/25/2015 0100    UROBILINOGEN 0.2 12/15/2015 1430   UROBILINOGEN 0.2 11/13/2014 2008   NITRITE Negative 12/15/2015 1430   NITRITE NEGATIVE 11/25/2015 0100   LEUKOCYTESUR Trace 12/15/2015 1430   LEUKOCYTESUR NEGATIVE 11/25/2015 0100    Radiological Exams on Admission: Ct Abdomen Pelvis W Contrast  01/16/2016  CLINICAL DATA:  Metastatic colon cancer, anemia, constipation EXAM: CT ABDOMEN AND PELVIS WITH CONTRAST TECHNIQUE: Multidetector CT imaging of the abdomen and pelvis was performed using the standard protocol  following bolus administration of intravenous contrast. CONTRAST:  142mL ISOVUE-300 IOPAMIDOL (ISOVUE-300) INJECTION 61% COMPARISON:  11/25/2015 FINDINGS: Lower chest:  Bilateral pulmonary nodules, including: --5 mm left lower lobe nodule (series 4/ image 13), unchanged --5 mm right lower lobe nodule (series 4/ image 21), new --9 mm nodule at the left lung base (series 4/ image 44), previously 6 mm Hepatobiliary: Progression of numerous widespread/multifocal hepatic metastases. Index lesion in the medial segment left hepatic lobe measures 5.6 x 7.7 cm (series 2/ image 26), previously 4.5 x 6.0 cm. Gallbladder is unremarkable. No intrahepatic or extrahepatic ductal dilatation. Pancreas: Fluid/ stranding adjacent to the pancreatic tail (series 2/ image 24), with possible additional stranding along the uncinate process and pancreaticoduodenal groove (series 2/ image 45), correlate for acute pancreatitis. Spleen: Surgically absent. Splenule in the left upper abdomen (series 2/ image 26). Adrenals/Urinary Tract: Adrenal glands are within normal limits. Right kidney is unremarkable, noting an extrarenal pelvis. Left kidney is notable for two nonobstructing calculi measuring up to 7 mm in the lower pole (series 2/image 40). No ureteral or bladder calculi.  No hydronephrosis. Bladder is within normal limits. Stomach/Bowel: Stomach is within normal limits. Inflammatory changes along the proximal duodenum (series 2/ image  46), suggesting duodenitis, although its possibly secondary to pancreatic inflammatory changes. No evidence of bowel obstruction. Normal appendix (series 2/ image 54). Status post partial colectomy with suture line in the mid abdomen (series 2/ image 48). Status post partial left hemicolectomy with suture line in the left pelvis (series 2/image 34). Associated 4.1 x 2.9 cm soft tissue mass at the surgical margin (series 2/image 73), previously 4.4 x 3.3 cm, stable versus mildly decreased. Extension posteriorly into the presacral region, where it measures 2.7 x 4.1 cm (series 62/image 70), previously 4.1 x 2.8 cm, unchanged. Extension into the pericolonic soft tissues with associated 3.1 x 2.2 cm soft tissue implant/node (series 2/image 36), previously 3.2 x 2.2 cm, unchanged. Vascular/Lymphatic: No evidence of abdominal aortic aneurysm. Additional 1.8 x 2.3 cm soft tissue implant/left common iliac node (series 2/image 63), previously 2.6 x 2.0 cm, stable versus mildly decreased. Reproductive: Status post hysterectomy. Other: Small volume of pelvic ascites, increased. Musculoskeletal: Degenerative changes of the visualized thoracolumbar spine, most prominent at L5-S1. IMPRESSION: Peripancreatic fluid/inflammatory changes, correlate for acute pancreatitis. Additional inflammatory changes involving the proximal duodenum, suggesting duodenitis, possibly secondary. Status post left hemicolectomy. Associated 4.1 cm soft tissue mass at the surgical margin, stable versus mildly increased. Mass extends posteriorly into the presacral space. Adjacent pericolonic extension anteriorly/inferiorly. Associated 2.3 cm soft tissue implant/left common iliac node, mildly decreased. Progression of multifocal hepatic metastases, including a dominant 7.7 cm mass in the left hepatic lobe, increased. Progression of small pulmonary metastases in the bilateral lower lobes, measuring up to 9 mm. Additional ancillary findings as above.  Electronically Signed   By: Julian Hy M.D.   On: 01/16/2016 17:24   Assessment/Plan Active Problems:   Colorectal cancer, stage IV, obstructing s/p stent x2, ostomy, then LAR 03/06/2015   Metastatic colon cancer to liver (HCC)   Anemia of chronic disease   Benign essential HTN   Absolute anemia   Hypercalcemia of malignancy   Hyponatremia   Abdominal pain, acute, left lower quadrant   Symptomatic anemia   Severe anemia   Symptomatically anemia - Unclear etiology of her blood loss, she has no blood in her stool or dark tarry stools. Fecal occult pending, will be obtained in the ED - Transfuse 3 units of packed red  blood cells - Clinically monitor  Colorectal cancer, stage IV, with liver and lung metastasis and progression of intra-abdominal masses, also with brain metastasis - Her primary oncologist discussed with patient regarding goals of care and approach patient with regard to hospice or palliative approach, however patient is against this. - Currently is not getting chemotherapy  Goals of care - I discussed with patient bedside regarding her tumor progression and extension of her metastasis, and progressive symptoms which are likely related to that, and the fact that this is not treatable - She wishes to remain a full code and does not want to think about hospice or palliative care at this point  Acute pancreatitis - She has symptoms consistent with this and CT findings, meats 2 out of 3 criteria - Lipase is pending - Continue IV fluids, pain control, nothing by mouth to allow bowel rest  Poorly controlled abdominal pain - While nothing by mouth, place on IV Dilaudid  LE swelling - She has a degree of dry swelling due to likely malnutrition however given the symmetry on the lower extremities will obtain a DVT ultrasound  Tachycardia - sinus rhythm due to anemia and tumor burden - Monitor on telemetry for now  Severe protein calorie malnutrition  Hyponatremia -  Likely in the setting of poor by mouth intake, monitor sodium and fluids  History of PE in 2016 - For now hold her Xarelto given uncertainty as to what's causing her anemia    DVT prophylaxis: SCD  Code Status: Full  Family Communication: no family bedside Disposition Plan: admit to tele Consults called: none   Admission status: inpatient    Marzetta Board, MD [facility] Pager [phone]  If 7PM-7AM, please contact night-coverage [email or web address] Password [facility]  01/16/2016, 6:08 PM

## 2016-01-16 NOTE — Assessment & Plan Note (Signed)
Corrected calcium was 13.2 today.  Patient is complaining of increased fatigue and generalized weakness; but denies any neurological changes.  Most likely, hypercalcemia secondary to disease progression.  Patient will be transported to the emergency department today for further evaluation and management.

## 2016-01-16 NOTE — Assessment & Plan Note (Signed)
Patient last received chemotherapy in 2016; and received her final radiation treatments in March 2017.  She currently receives Zometa on an every three-month basis.  Her last Zometa infusion was 12/17/2015.  Patient presented to the [facility] today with complaint of nausea, poor oral intake, dehydration, and aggressive left lower quadrant abdominal discomfort.  She is also noted increased edema to her bilateral lower extremities as well.  She denies any recent fevers or chills.  She continues to take Xarelto as directed.  See further notes for details of today's visit.  Due to all of patient's issues-patient will be transported directly to the emergency department for further evaluation and management.  Brief history.  Report were called to the emergency department charge nurse; prior to the patient being transported via wheelchair to the emergency department per the [facility].  Also, reviewed all findings with Dr. Burr Medico; who was in complete agreement with the plan to transfer the patient to the emergency department for further evaluation and management.  Discussion with both the patient and her family regarding option of palliative care and for sure hospice-but patient stated that she was not ready to consider those options.  Also, reviewed CODE STATUS options; and patient confirmed that she wants to remain a full code for the time being.  Patient is scheduled to return for labs, flush, and a visit on 01/26/2016.

## 2016-01-16 NOTE — Patient Instructions (Signed)

## 2016-01-16 NOTE — Telephone Encounter (Signed)
Pt called stating that she is having stomach problems. She was constipated and moved her bowels with miralax and prune juice today. She threw up last night. Her stomach is sore and tight. It is sore when she breaths. This started yesterday. S/w Selena Lesser and called pt back to come in for [facility] visit. She will need to arrange a ride to [facility]. POF sent.

## 2016-01-16 NOTE — ED Notes (Signed)
Pt brought in from cancer center, reportedly she has hx of colorectal cancer and brain mets. She called cancer center c/o "not feeling well, constipation" and "stomach feeling tight". While at [facility]er center pt was found to have hemoglobin of 4.1 as well as other critical labs.

## 2016-01-16 NOTE — ED Notes (Signed)
Patient transported to CT 

## 2016-01-16 NOTE — Assessment & Plan Note (Addendum)
Albumin has also markedly decreased from 3.4 down to 1.9.  Patient states she's had some chronic nausea and decreased appetite with subsequent poor oral intake and dehydration.  Patient was encourage to push protein in her diet is much as possible.

## 2016-01-16 NOTE — Assessment & Plan Note (Signed)
Patient states she's been experiencing increasing, intermittent nausea; but no vomiting.  She states that she has not taken any nausea medications for treatment of the nausea.  As of yet.  She has also had decreased appetite and feels dehydrated today.

## 2016-01-16 NOTE — ED Provider Notes (Signed)
CSN: [id number]     Arrival date & time 01/16/16  1431 History   First MD Initiated Contact with Patient 01/16/16 1536     Chief Complaint  Patient presents with  . Abnormal Lab     (Consider location/radiation/quality/duration/timing/severity/associated sxs/prior Treatment) HPI   65 year old female sent for evaluation of severe anemia. She has a past history of metastatic colorectal cancer. Labs resulted from earlier today with a hemoglobin of 4.1. One month ago was 9.3. She denies any overt bleeding. No melena. In the last few days she has had some lower extremity edema. The past 24 hours she has had upper abdominal pain. Mild nausea. No vomiting. She does feel very weak. She does not feel motivated to do anything. She denies any dyspnea. No dizziness or lightheadedness. 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  Past Medical History  Diagnosis Date  . Colon cancer ([place])     dx'd 2014  . Heart murmur     ? as a child   . Pulmonary embolism ([place]) 11/2014   . Anemia     hx of   . S/P radiation therapy 09/18/15    SRS left frontoparietal 20Gy   Past Surgical History  Procedure Laterality Date  . Abdominal hysterectomy  2005  . Cesarean section  1976  . Flexible sigmoidoscopy N/A 05/25/2013    Procedure: FLEXIBLE SIGMOIDOSCOPY;  Surgeon: [name], MD;  Location: [facility];  Service: Endoscopy;  Laterality: N/A;  needs floro  . Colonic stent placement N/A 05/25/2013    Procedure: COLONIC STENT PLACEMENT;  Surgeon: [name], MD;  Location: [facility];  Service: Endoscopy;  Laterality: N/A;  . Portacath placement Right 06/25/2013    Procedure: ULTRA SOUND GUIDED INSERTION PORT-A-CATH;  Surgeon: [name], MD;  Location: [facility];  Service: General;  Laterality: Right;  . Flexible sigmoidoscopy N/A 08/19/2014    Procedure: FLEXIBLE SIGMOIDOSCOPY;  Surgeon: Gatha Mayer, MD;  Location: [facility];  Service: Endoscopy;  Laterality:  N/A;  . Colonic stent placement N/A 08/19/2014    Procedure: COLONIC STENT PLACEMENT;  Surgeon: Gatha Mayer, MD;  Location: [facility];  Service: Endoscopy;  Laterality: N/A;  . Flexible sigmoidoscopy N/A 08/19/2014    Procedure: FLEXIBLE SIGMOIDOSCOPY;  Surgeon: Gatha Mayer, MD;  Location: [facility];  Service: Endoscopy;  Laterality: N/A;  . Colonic stent placement N/A 08/19/2014    Procedure: COLONIC STENT PLACEMENT;  Surgeon: Gatha Mayer, MD;  Location: [facility];  Service: Endoscopy;  Laterality: N/A;  . Colon resection N/A 10/28/2014    Procedure: Transverse loop colostomy;  Surgeon: [name], MD;  Location: [facility];  Service: General;  Laterality: N/A;  . Colostomy revision N/A 01/03/2015    Procedure: REVISION OF TRANSVERSE LOOP COLOSTOMY WITH PARTIAL  COLECTOMY;  Surgeon: [name], MD;  Location: [facility];  Service: General;  Laterality: N/A;  . Laparoscopic low anterior resection  03/06/2015    03/06/2015  . Laparoscopic splenectomy  03/06/2015  . Colon resection N/A 03/06/2015    Procedure: LAPAROSCOPIC takedown loop colostomy ;  Surgeon: Michael Boston, MD;  Location: [facility];  Service: General;  Laterality: N/A;  . 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 Bowel resection  03/06/2015    Procedure: LOW ANTERIOR BOWEL RESECTION, rigid proctoscopy;  Surgeon: Michael Boston, MD;  Location: [facility];  Service: General;;  . Polypectomy  03/06/2015    Procedure: transverse POLYPECTOMY x 4;  Surgeon: Michael Boston, MD;  Location: [facility];  Service: General;;   Family History  Problem Relation Age of Onset  . Colon cancer Sister 36  . Diabetes Neg Hx   . Thyroid disease Neg Hx   . Breast cancer Maternal Aunt   . Prostate cancer Maternal Uncle   . Bone cancer Maternal Grandfather   . Prostate cancer Maternal Uncle    Social History  Substance Use Topics  . Smoking status:  Former Smoker -- 1 years    Types: Cigarettes    Quit date: 05/24/1971  . Smokeless tobacco: Never Used  . Alcohol Use: No   OB History    No data available     Review of Systems  All systems reviewed and negative, other than as noted in HPI.   Allergies  Oxycontin and Codeine  Home Medications   Prior to Admission medications   Medication Sig Start Date End Date Taking? Authorizing Provider  acetaminophen (TYLENOL) 500 MG tablet Take 500 mg by mouth every 6 (six) hours as needed for moderate pain or headache.    Yes Historical Provider, MD  HYDROmorphone (DILAUDID) 4 MG tablet Take 1 tablet (4 mg total) by mouth every 4 (four) hours as needed for severe pain. 01/05/16  Yes Hayden Pedro, PA-C  IRON PO Take 1 tablet by mouth every morning.   Yes Historical Provider, MD  polyethylene glycol powder (GLYCOLAX/MIRALAX) powder TAKE 34 G (2 DOSES) BY MOUTH DAILY AS NEEDED FOR MILD CONSTIPATION OR MODERATE CONSTIPATION 03/03/14  Yes Concha Norway, MD  vitamin B-12 (CYANOCOBALAMIN) 500 MCG tablet Take 500 mcg by mouth daily.   Yes Historical Provider, MD  XARELTO 20 MG TABS tablet TAKE 1 TABLET BY MOUTH EVERY DAY WITH SUPPER 01/14/16  Yes Truitt Merle, MD  dexamethasone (DECADRON) 2 MG tablet Take 2mg  BID for 1 week, then 2 mg daily for 1 week, then 2mg  every other day for one week then stop Patient not taking: Reported on 01/16/2016 12/17/15   Kyung Rudd, MD  fluconazole (DIFLUCAN) 100 MG tablet Take 1 tablet (100 mg total) by mouth daily. Take 2 tabs x 1, then 1 tab QD x 9 additional days. Patient not taking: Reported on 01/16/2016 01/05/16   Hayden Pedro, PA-C  pantoprazole (PROTONIX) 40 MG tablet Take 1 tablet (40 mg total) by mouth daily. Patient not taking: Reported on 01/16/2016 12/02/15   Lavon Paganini Angiulli, PA-C   BP 134/70 mmHg  Pulse 115  Temp(Src) 98.6 F (37 C) (Oral)  Resp 18  SpO2 100% Physical Exam  Constitutional: She appears well-developed and well-nourished. No  distress.  HENT:  Head: Normocephalic and atraumatic.  Eyes: Conjunctivae are normal. Right eye exhibits no discharge. Left eye exhibits no discharge.  Neck: Neck supple.  Cardiovascular: Normal rate, regular rhythm and normal heart sounds.  Exam reveals no gallop and no friction rub.   No murmur heard. Pulmonary/Chest: Effort normal and breath sounds normal. No respiratory distress.  Abdominal: Soft. She exhibits no distension. There is tenderness.  Epigastric and LUQ tenderness w/o rebound or guarding  Musculoskeletal: She exhibits no edema or tenderness.  Neurological: She is alert.  Skin: Skin is warm and dry.  Psychiatric: She has a normal mood and affect. Her behavior is normal. Thought content normal.  Nursing note and vitals reviewed.   ED Course  Procedures (including critical care time)  CRITICAL CARE  Performed by: Virgel Manifold Total critical care time: 35 minutes Critical care time was exclusive of separately billable procedures and treating other patients. Critical care was necessary to treat or prevent imminent or life-threatening deterioration. Critical care was time spent personally by me on the following activities: development of treatment plan with patient and/or surrogate as well as nursing, discussions with consultants, evaluation of patient's response to treatment, examination of patient, obtaining history from patient or surrogate, ordering and performing treatments and interventions, ordering and review of laboratory studies, ordering and review of radiographic studies, pulse oximetry and re-evaluation of patient's condition.  Labs Review Labs Reviewed  URINALYSIS, ROUTINE W REFLEX MICROSCOPIC (NOT AT [facility]) - Abnormal; Notable for the following:    Leukocytes, UA TRACE (*)    All other components within normal limits  LIPASE, BLOOD - Abnormal; Notable for the following:    Lipase 167 (*)    All other components within normal limits  URINE MICROSCOPIC-ADD ON  - Abnormal; Notable for the following:    Squamous Epithelial / LPF 0-5 (*)    Bacteria, UA RARE (*)    All other components within normal limits  CBC - Abnormal; Notable for the following:    WBC 14.6 (*)    RBC 3.08 (*)    Hemoglobin 8.1 (*)    HCT 24.1 (*)    RDW 17.2 (*)    Platelets 594 (*)    All other components within normal limits  BASIC METABOLIC PANEL - Abnormal; Notable for the following:    Calcium 10.5 (*)    All other components within normal limits  CBC - Abnormal; Notable for the following:    WBC 14.1 (*)    RBC 3.01 (*)    Hemoglobin 7.9 (*)    HCT 23.6 (*)    RDW 17.7 (*)    Platelets 459 (*)    All other components within normal limits  URINALYSIS, ROUTINE W REFLEX MICROSCOPIC (NOT AT [facility]) - Abnormal; Notable for the following:    Hgb urine dipstick TRACE (*)    All other components within normal limits  URINE MICROSCOPIC-ADD ON - Abnormal; Notable for the following:    Squamous Epithelial / LPF 0-5 (*)    Bacteria, UA RARE (*)    All other components within normal limits  OCCULT BLOOD X 1 CARD TO LAB, STOOL  PREPARE RBC (CROSSMATCH)  TYPE AND SCREEN    Imaging Review Ct Abdomen Pelvis W Contrast  01/16/2016  CLINICAL DATA:  Metastatic colon cancer, anemia, constipation EXAM: CT ABDOMEN AND PELVIS WITH CONTRAST TECHNIQUE: Multidetector CT imaging of the abdomen and pelvis was performed using the standard protocol following bolus administration of intravenous contrast. CONTRAST:  117mL ISOVUE-300 IOPAMIDOL (ISOVUE-300) INJECTION 61% COMPARISON:  11/25/2015 FINDINGS: Lower chest:  Bilateral pulmonary nodules, including: --5 mm left lower lobe nodule (series 4/ image 13), unchanged --5 mm right lower lobe nodule (series 4/ image 21), new --9 mm nodule at the left lung base (series 4/ image 44), previously 6 mm Hepatobiliary: Progression of numerous widespread/multifocal hepatic metastases. Index lesion in the medial segment left hepatic lobe measures 5.6 x 7.7  cm (series 2/ image 26), previously 4.5 x 6.0 cm. Gallbladder is unremarkable. No intrahepatic or extrahepatic ductal dilatation. Pancreas: Fluid/ stranding adjacent to the pancreatic tail (series 2/ image 24), with possible additional stranding along the uncinate process and pancreaticoduodenal groove (series 2/ image 45), correlate for acute pancreatitis. Spleen: Surgically absent. Splenule in the left upper abdomen (series 2/  image 26). Adrenals/Urinary Tract: Adrenal glands are within normal limits. Right kidney is unremarkable, noting an extrarenal pelvis. Left kidney is notable for two nonobstructing calculi measuring up to 7 mm in the lower pole (series 2/image 40). No ureteral or bladder calculi.  No hydronephrosis. Bladder is within normal limits. Stomach/Bowel: Stomach is within normal limits. Inflammatory changes along the proximal duodenum (series 2/ image 46), suggesting duodenitis, although its possibly secondary to pancreatic inflammatory changes. No evidence of bowel obstruction. Normal appendix (series 2/ image 54). Status post partial colectomy with suture line in the mid abdomen (series 2/ image 48). Status post partial left hemicolectomy with suture line in the left pelvis (series 2/image 34). Associated 4.1 x 2.9 cm soft tissue mass at the surgical margin (series 2/image 73), previously 4.4 x 3.3 cm, stable versus mildly decreased. Extension posteriorly into the presacral region, where it measures 2.7 x 4.1 cm (series 62/image 70), previously 4.1 x 2.8 cm, unchanged. Extension into the pericolonic soft tissues with associated 3.1 x 2.2 cm soft tissue implant/node (series 2/image 36), previously 3.2 x 2.2 cm, unchanged. Vascular/Lymphatic: No evidence of abdominal aortic aneurysm. Additional 1.8 x 2.3 cm soft tissue implant/left common iliac node (series 2/image 63), previously 2.6 x 2.0 cm, stable versus mildly decreased. Reproductive: Status post hysterectomy. Other: Small volume of pelvic  ascites, increased. Musculoskeletal: Degenerative changes of the visualized thoracolumbar spine, most prominent at L5-S1. IMPRESSION: Peripancreatic fluid/inflammatory changes, correlate for acute pancreatitis. Additional inflammatory changes involving the proximal duodenum, suggesting duodenitis, possibly secondary. Status post left hemicolectomy. Associated 4.1 cm soft tissue mass at the surgical margin, stable versus mildly increased. Mass extends posteriorly into the presacral space. Adjacent pericolonic extension anteriorly/inferiorly. Associated 2.3 cm soft tissue implant/left common iliac node, mildly decreased. Progression of multifocal hepatic metastases, including a dominant 7.7 cm mass in the left hepatic lobe, increased. Progression of small pulmonary metastases in the bilateral lower lobes, measuring up to 9 mm. Additional ancillary findings as above. Electronically Signed   By: Julian Hy M.D.   On: 01/16/2016 17:24   I have personally reviewed and evaluated these images and lab results as part of my medical decision-making.   EKG Interpretation None      MDM   Final diagnoses:  Severe anemia  Acute pancreatitis, unspecified pancreatitis type    65 year old female with severe anemia. Probably GI given her history of colon cancer.  Upper abdominal pain with CT changes consistent with pancreatitis. Lipase added. Symptom control. Blood transfusion. Admission.    Virgel Manifold, MD 01/19/16 [phone]

## 2016-01-16 NOTE — Assessment & Plan Note (Deleted)
Corrected calcium was 13.2 today.  Patient is complaining of increased fatigue and generalized weakness; but denies any neurological changes.  Most likely, hypercalcemia secondary to disease progression.  Patient will be transported to the emergency department today for further evaluation and management.

## 2016-01-16 NOTE — ED Notes (Signed)
Bed: WA02 Expected date:  Expected time:  Means of arrival:  Comments: [facility]

## 2016-01-17 ENCOUNTER — Inpatient Hospital Stay (HOSPITAL_COMMUNITY): Payer: Medicare Other

## 2016-01-17 DIAGNOSIS — R609 Edema, unspecified: Secondary | ICD-10-CM

## 2016-01-17 DIAGNOSIS — D638 Anemia in other chronic diseases classified elsewhere: Secondary | ICD-10-CM

## 2016-01-17 DIAGNOSIS — I10 Essential (primary) hypertension: Secondary | ICD-10-CM

## 2016-01-17 LAB — BASIC METABOLIC PANEL
ANION GAP: 6 (ref 5–15)
BUN: 6 mg/dL (ref 6–20)
CO2: 24 mmol/L (ref 22–32)
Calcium: 10.5 mg/dL — ABNORMAL HIGH (ref 8.9–10.3)
Chloride: 107 mmol/L (ref 101–111)
Creatinine, Ser: 0.49 mg/dL (ref 0.44–1.00)
GFR calc Af Amer: >60 mL/min (ref >60)
GLUCOSE: 98 mg/dL (ref 65–99)
POTASSIUM: 3.9 mmol/L (ref 3.5–5.1)
Sodium: 137 mmol/L (ref 135–145)

## 2016-01-17 LAB — CBC
HCT: 24.1 % — ABNORMAL LOW (ref 36.0–46.0)
Hemoglobin: 8.1 g/dL — ABNORMAL LOW (ref 12.0–15.0)
MCH: 26.3 pg (ref 26.0–34.0)
MCHC: 33.6 g/dL (ref 30.0–36.0)
MCV: 78.2 fL (ref 78.0–100.0)
PLATELETS: 594 10*3/uL — AB (ref 150–400)
RBC: 3.08 MIL/uL — AB (ref 3.87–5.11)
RDW: 17.2 % — ABNORMAL HIGH (ref 11.5–15.5)
WBC: 14.6 10*3/uL — AB (ref 4.0–10.5)

## 2016-01-17 MED ORDER — RIVAROXABAN 20 MG PO TABS
20.0000 mg | ORAL_TABLET | Freq: Every day | ORAL | Status: DC
  Administered 2016-01-17: 20 mg via ORAL
  Filled 2016-01-17: qty 1

## 2016-01-17 NOTE — Progress Notes (Signed)
Preliminary results by tech - Venous Duplex Lower Ext. Completed. Negative for deep and superficial vein thrombosis in both legs.  Bobby Ragan, BS, RDMS, RVT  

## 2016-01-17 NOTE — Progress Notes (Addendum)
PROGRESS NOTE  [name] [name] [id number] DOB: 11/26/1950 DOA: 01/16/2016 PCP: Kristine Garbe, MD   LOS: 1 day   Brief Narrative: Patient wishes only certain family members to know her medical problems (husband or 2 daughters only), no other family members or friends  [name] [name] is a 65 y.o. female with medical history significant of colon cancer diagnosed in 2014 status post colectomy, stent placement, as well as chemotherapy finished in 2016 and final radiation treatment in 2017 in March, chronic hypercalcemia of malignancy receiving Zometa every 3 months, most recent one was about a month ago. Patient presented to the emergency room brought in from the [place] with severe left lower quadrant abdominal pain, nausea, dehydration, and weakness  Assessment & Plan: Active Problems:   Colorectal cancer, stage IV, obstructing s/p stent x2, ostomy, then LAR 03/06/2015   Metastatic colon cancer to liver (HCC)   Anemia of chronic disease   Benign essential HTN   Absolute anemia   Hypercalcemia of malignancy   Hyponatremia   Abdominal pain, acute, left lower quadrant   Symptomatic anemia   Severe anemia   Symptomatically anemia - Unclear etiology of her blood loss, ?related to extensive malignancy - FOBT negative, CT abdomen pelvis without bleeding, UA without blood - responded appropriately to 3 units of packed red blood cells - resume Xarelto since no bleeding. Closely monitor CBC. Discussed with Dr. Burr Medico over the phone  Colorectal cancer, stage IV, with liver and lung metastasis and progression of intra-abdominal masses, also with brain metastasis - Her primary oncologist discussed with patient regarding goals of care and approach patient with regard to hospice or palliative approach, however patient is against this. - Currently is not getting chemotherapy  Goals of care - I discussed with patient bedside on admission regarding her tumor progression and extension of her  metastasis, and progressive symptoms which are likely related to that, and the fact that this is not treatable - She wishes to remain a full code and does not want to think about hospice or palliative care at this point   Acute pancreatitis - ? Related to malignancy, no ductal dilatation on CT, LFTs normal - improving, no further nausea, abdominal pain significantly improved today, start clears  Hypercalcemia of malignancy - on Zometa as an outpatient - improved with fluids  Abdominal pain - due to metastatic cancer, pancreatitis - improved  LE swelling - She has a degree of dry swelling due to likely malnutrition  - DVT US negative for DVT. On Xarelto anyway  Tachycardia - sinus rhythm due to anemia and tumor burden - improved with correction of her anemia  Severe protein calorie malnutrition  Hyponatremia - resolved with hydration  History of PE in 2016 - resume Xarelto as above.    DVT prophylaxis: Xarelto Code Status: Full Family Communication: no family bedside Disposition Plan: home when ready, 1-2 days  Consultants:   None   Procedures:  None   Antimicrobials:  None    Subjective: - no chest pain, shortness of breath - abdominal pain significantly better this morning, no nausea. She is hungry   Objective: Filed Vitals:   01/16/16 2304 01/17/16 0127 01/17/16 0143 01/17/16 0359  BP: 122/63 126/66 114/64 122/71  Pulse: 105 90 87 96  Temp: 99.1 F (37.3 C) 98.4 F (36.9 C) 98.4 F (36.9 C) 98.4 F (36.9 C)  TempSrc: Oral Oral Oral Oral  Resp: 16 19 20 20   Height:      Weight:  SpO2: 100% 100% 100% 100%    Intake/Output Summary (Last 24 hours) at 01/17/16 1214 Last data filed at 01/17/16 1123  Gross per 24 hour  Intake 1266.67 ml  Output    850 ml  Net 416.67 ml   Filed Weights   01/16/16 2140  Weight: 66 kg (145 lb 8.1 oz)    Examination: Constitutional: NAD Filed Vitals:   01/16/16 2304 01/17/16 0127 01/17/16 0143 01/17/16  0359  BP: 122/63 126/66 114/64 122/71  Pulse: 105 90 87 96  Temp: 99.1 F (37.3 C) 98.4 F (36.9 C) 98.4 F (36.9 C) 98.4 F (36.9 C)  TempSrc: Oral Oral Oral Oral  Resp: 16 19 20 20   Height:      Weight:      SpO2: 100% 100% 100% 100%   Eyes: PERRL, lids and conjunctivae normal Respiratory: clear to auscultation bilaterally, no wheezing, no crackles. Normal respiratory effort. No accessory muscle use.  Cardiovascular: Regular rate and rhythm, no murmurs / rubs / gallops. + LE edema.  Abdomen: + tenderness bilateral lower quadrants, improved. Bowel sounds positive.  Musculoskeletal: no clubbing / cyanosis. Neurologic: non focal    Data Reviewed: I have personally reviewed following labs and imaging studies  CBC:  Recent Labs Lab 01/16/16 1153 01/17/16 0610  WBC 16.1* 14.6*  NEUTROABS 13.6*  --   HGB 4.2* 8.1*  HCT 14.0* 24.1*  MCV 73.3* 78.2  PLT 637* [id number]*   Basic Metabolic Panel:  Recent Labs Lab 01/16/16 1154 01/17/16 0610  NA 133* 137  K 4.2 3.9  CL  --  107  CO2 21* 24  GLUCOSE 115 98  BUN 8.5 6  CREATININE 0.7 0.49  CALCIUM 11.5* 10.5*   GFR: Estimated Creatinine Clearance: 61.8 mL/min (by C-G formula based on Cr of 0.49). Liver Function Tests:  Recent Labs Lab 01/16/16 1154  AST 25  ALT 21  ALKPHOS 188*  BILITOT <0.30  PROT 6.8  ALBUMIN 1.9*    Recent Labs Lab 01/16/16 1829  LIPASE 167*   No results for input(s): AMMONIA in the last 168 hours. Coagulation Profile: No results for input(s): INR, PROTIME in the last 168 hours. Cardiac Enzymes: No results for input(s): CKTOTAL, CKMB, CKMBINDEX, TROPONINI in the last 168 hours. BNP (last 3 results) No results for input(s): PROBNP in the last 8760 hours. HbA1C: No results for input(s): HGBA1C in the last 72 hours. CBG: No results for input(s): GLUCAP in the last 168 hours. Lipid Profile: No results for input(s): CHOL, HDL, LDLCALC, TRIG, CHOLHDL, LDLDIRECT in the last 72  hours. Thyroid Function Tests: No results for input(s): TSH, T4TOTAL, FREET4, T3FREE, THYROIDAB in the last 72 hours. Anemia Panel:  Recent Labs  01/16/16 1154  FERRITIN 79  TIBC 209*  IRON <11*   Urine analysis:    Component Value Date/Time   COLORURINE YELLOW 01/16/2016 1841   APPEARANCEUR CLEAR 01/16/2016 1841   LABSPEC 1.023 01/16/2016 1841   LABSPEC 1.020 12/15/2015 1430   PHURINE 6.5 01/16/2016 1841   PHURINE 6.0 12/15/2015 1430   GLUCOSEU NEGATIVE 01/16/2016 1841   GLUCOSEU Negative 12/15/2015 1430   HGBUR NEGATIVE 01/16/2016 1841   HGBUR Moderate 12/15/2015 1430   BILIRUBINUR NEGATIVE 01/16/2016 1841   BILIRUBINUR Negative 12/15/2015 1430   KETONESUR NEGATIVE 01/16/2016 1841   KETONESUR Negative 12/15/2015 1430   PROTEINUR NEGATIVE 01/16/2016 1841   PROTEINUR Negative 12/15/2015 1430   UROBILINOGEN 0.2 12/15/2015 1430   UROBILINOGEN 0.2 11/13/2014 2008   NITRITE NEGATIVE 01/16/2016 1841  NITRITE Negative 12/15/2015 1430   LEUKOCYTESUR TRACE* 01/16/2016 1841   LEUKOCYTESUR Trace 12/15/2015 1430   Sepsis Labs: Invalid input(s): PROCALCITONIN, LACTICIDVEN  No results found for this or any previous visit (from the past 240 hour(s)).    Radiology Studies: Ct Abdomen Pelvis W Contrast  01/16/2016  CLINICAL DATA:  Metastatic colon cancer, anemia, constipation EXAM: CT ABDOMEN AND PELVIS WITH CONTRAST TECHNIQUE: Multidetector CT imaging of the abdomen and pelvis was performed using the standard protocol following bolus administration of intravenous contrast. CONTRAST:  146mL ISOVUE-300 IOPAMIDOL (ISOVUE-300) INJECTION 61% COMPARISON:  11/25/2015 FINDINGS: Lower chest:  Bilateral pulmonary nodules, including: --5 mm left lower lobe nodule (series 4/ image 13), unchanged --5 mm right lower lobe nodule (series 4/ image 21), new --9 mm nodule at the left lung base (series 4/ image 44), previously 6 mm Hepatobiliary: Progression of numerous widespread/multifocal hepatic  metastases. Index lesion in the medial segment left hepatic lobe measures 5.6 x 7.7 cm (series 2/ image 26), previously 4.5 x 6.0 cm. Gallbladder is unremarkable. No intrahepatic or extrahepatic ductal dilatation. Pancreas: Fluid/ stranding adjacent to the pancreatic tail (series 2/ image 24), with possible additional stranding along the uncinate process and pancreaticoduodenal groove (series 2/ image 45), correlate for acute pancreatitis. Spleen: Surgically absent. Splenule in the left upper abdomen (series 2/ image 26). Adrenals/Urinary Tract: Adrenal glands are within normal limits. Right kidney is unremarkable, noting an extrarenal pelvis. Left kidney is notable for two nonobstructing calculi measuring up to 7 mm in the lower pole (series 2/image 40). No ureteral or bladder calculi.  No hydronephrosis. Bladder is within normal limits. Stomach/Bowel: Stomach is within normal limits. Inflammatory changes along the proximal duodenum (series 2/ image 46), suggesting duodenitis, although its possibly secondary to pancreatic inflammatory changes. No evidence of bowel obstruction. Normal appendix (series 2/ image 54). Status post partial colectomy with suture line in the mid abdomen (series 2/ image 48). Status post partial left hemicolectomy with suture line in the left pelvis (series 2/image 34). Associated 4.1 x 2.9 cm soft tissue mass at the surgical margin (series 2/image 73), previously 4.4 x 3.3 cm, stable versus mildly decreased. Extension posteriorly into the presacral region, where it measures 2.7 x 4.1 cm (series 62/image 70), previously 4.1 x 2.8 cm, unchanged. Extension into the pericolonic soft tissues with associated 3.1 x 2.2 cm soft tissue implant/node (series 2/image 36), previously 3.2 x 2.2 cm, unchanged. Vascular/Lymphatic: No evidence of abdominal aortic aneurysm. Additional 1.8 x 2.3 cm soft tissue implant/left common iliac node (series 2/image 63), previously 2.6 x 2.0 cm, stable versus mildly  decreased. Reproductive: Status post hysterectomy. Other: Small volume of pelvic ascites, increased. Musculoskeletal: Degenerative changes of the visualized thoracolumbar spine, most prominent at L5-S1. IMPRESSION: Peripancreatic fluid/inflammatory changes, correlate for acute pancreatitis. Additional inflammatory changes involving the proximal duodenum, suggesting duodenitis, possibly secondary. Status post left hemicolectomy. Associated 4.1 cm soft tissue mass at the surgical margin, stable versus mildly increased. Mass extends posteriorly into the presacral space. Adjacent pericolonic extension anteriorly/inferiorly. Associated 2.3 cm soft tissue implant/left common iliac node, mildly decreased. Progression of multifocal hepatic metastases, including a dominant 7.7 cm mass in the left hepatic lobe, increased. Progression of small pulmonary metastases in the bilateral lower lobes, measuring up to 9 mm. Additional ancillary findings as above. Electronically Signed   By: Julian Hy M.D.   On: 01/16/2016 17:24     Scheduled Meds: . sodium chloride flush  3 mL Intravenous Q12H   Continuous Infusions: . sodium chloride  100 mL/hr at 01/17/16 0405    Marzetta Board, MD, PhD [facility] Pager [phone]  If 7PM-7AM, please contact night-coverage [email or web address] Password [facility] 01/17/2016, 12:14 PM

## 2016-01-18 DIAGNOSIS — E871 Hypo-osmolality and hyponatremia: Secondary | ICD-10-CM

## 2016-01-18 LAB — URINE MICROSCOPIC-ADD ON

## 2016-01-18 LAB — CBC
HCT: 23.6 % — ABNORMAL LOW (ref 36.0–46.0)
HEMOGLOBIN: 7.9 g/dL — AB (ref 12.0–15.0)
MCH: 26.2 pg (ref 26.0–34.0)
MCHC: 33.5 g/dL (ref 30.0–36.0)
MCV: 78.4 fL (ref 78.0–100.0)
PLATELETS: 459 10*3/uL — AB (ref 150–400)
RBC: 3.01 MIL/uL — AB (ref 3.87–5.11)
RDW: 17.7 % — ABNORMAL HIGH (ref 11.5–15.5)
WBC: 14.1 10*3/uL — AB (ref 4.0–10.5)

## 2016-01-18 LAB — URINALYSIS, ROUTINE W REFLEX MICROSCOPIC
Bilirubin Urine: NEGATIVE
Glucose, UA: NEGATIVE mg/dL
Ketones, ur: NEGATIVE mg/dL
LEUKOCYTES UA: NEGATIVE
NITRITE: NEGATIVE
PH: 6.5 (ref 5.0–8.0)
Protein, ur: NEGATIVE mg/dL
SPECIFIC GRAVITY, URINE: 1.014 (ref 1.005–1.030)

## 2016-01-18 MED ORDER — HEPARIN SOD (PORK) LOCK FLUSH 100 UNIT/ML IV SOLN
500.0000 [IU] | INTRAVENOUS | Status: AC | PRN
  Administered 2016-01-18: 500 [IU]

## 2016-01-18 MED ORDER — HYDROMORPHONE HCL 4 MG PO TABS
4.0000 mg | ORAL_TABLET | ORAL | Status: DC | PRN
  Administered 2016-01-18: 4 mg via ORAL
  Filled 2016-01-18: qty 1

## 2016-01-18 NOTE — Discharge Instructions (Signed)
Follow with REESE,BETTI D, MD in 5-7 days  Please get a complete blood count and chemistry panel checked by your Primary MD at your next visit, and again as instructed by your Primary MD. Please get your medications reviewed and adjusted by your Primary MD.  Please request your Primary MD to go over all Hospital Tests and Procedure/Radiological results at the follow up, please get all Hospital records sent to your Prim MD by signing hospital release before you go home.  If you had Pneumonia of Lung problems at the Hospital: Please get a 2 view Chest X ray done in 6-8 weeks after hospital discharge or sooner if instructed by your Primary MD.  If you have Congestive Heart Failure: Please call your Cardiologist or Primary MD anytime you have any of the following symptoms:  1) 3 pound weight gain in 24 hours or 5 pounds in 1 week  2) shortness of breath, with or without a dry hacking cough  3) swelling in the hands, feet or stomach  4) if you have to sleep on extra pillows at night in order to breathe  Follow cardiac low salt diet and 1.5 lit/day fluid restriction.  If you have diabetes Accuchecks 4 times/day, Once in AM empty stomach and then before each meal. Log in all results and show them to your primary doctor at your next visit. If any glucose reading is under 80 or above 300 call your primary MD immediately.  If you have Seizure/Convulsions/Epilepsy: Please do not drive, operate heavy machinery, participate in activities at heights or participate in high speed sports until you have seen by Primary MD or a Neurologist and advised to do so again.  If you had Gastrointestinal Bleeding: Please ask your Primary MD to check a complete blood count within one week of discharge or at your next visit. Your endoscopic/colonoscopic biopsies that are pending at the time of discharge, will also need to followed by your Primary MD.  Get Medicines reviewed and adjusted. Please take all your  medications with you for your next visit with your Primary MD  Please request your Primary MD to go over all hospital tests and procedure/radiological results at the follow up, please ask your Primary MD to get all Hospital records sent to his/her office.  If you experience worsening of your admission symptoms, develop shortness of breath, life threatening emergency, suicidal or homicidal thoughts you must seek medical attention immediately by calling 911 or calling your MD immediately  if symptoms less severe.  You must read complete instructions/literature along with all the possible adverse reactions/side effects for all the Medicines you take and that have been prescribed to you. Take any new Medicines after you have completely understood and accpet all the possible adverse reactions/side effects.   Do not drive or operate heavy machinery when taking Pain medications.   Do not take more than prescribed Pain, Sleep and Anxiety Medications  Special Instructions: If you have smoked or chewed Tobacco  in the last 2 yrs please stop smoking, stop any regular Alcohol  and or any Recreational drug use.  Wear Seat belts while driving.  Please note You were cared for by a hospitalist during your hospital stay. If you have any questions about your discharge medications or the care you received while you were in the hospital after you are discharged, you can call the unit and asked to speak with the hospitalist on call if the hospitalist that took care of you is not available. Once  you are discharged, your primary care physician will handle any further medical issues. Please note that NO REFILLS for any discharge medications will be authorized once you are discharged, as it is imperative that you return to your primary care physician (or establish a relationship with a primary care physician if you do not have one) for your aftercare needs so that they can reassess your need for medications and monitor your  lab values.  You can reach the hospitalist office at phone [phone] or fax [phone]   If you do not have a primary care physician, you can call [phone] for a physician referral.  Activity: As tolerated with Full fall precautions use walker/cane & assistance as needed  Diet: regular  Disposition Home

## 2016-01-18 NOTE — Discharge Summary (Signed)
Physician Discharge Summary  [name] [name] [id number] DOB: [birth year] DOA: 01/16/2016  PCP: [name] Garbe, MD  Admit date: 01/16/2016 Discharge date: 01/18/2016  Admitted From: home Disposition:  home  Recommendations for Outpatient Follow-up:  1. Follow up with [name] [name] in 1 week  [place] Walk: none Equipment/Devices: none  Discharge Condition: stable CODE STATUS: Full Diet recommendation: regular  HPI: [name] [name] is a 65 y.o. female with medical history significant of colon cancer diagnosed in 2014 status post colectomy, stent placement, as well as chemotherapy finished in 2016 and final radiation treatment in 2017 in March, chronic hypercalcemia of malignancy receiving Zometa every 3 months, most recent one was about a month ago. Patient presented to the emergency room brought in from the [facility] with severe left lower quadrant abdominal pain, nausea, dehydration, and weakness  Hospital Course: Discharge Diagnoses:  Active Problems:   Colorectal cancer, stage IV, obstructing s/p stent x2, ostomy, then LAR 03/06/2015   Metastatic colon cancer to liver (HCC)   Anemia of chronic disease   Benign essential HTN   Absolute anemia   Hypercalcemia of malignancy   Hyponatremia   Abdominal pain, acute, left lower quadrant   Symptomatic anemia   Severe anemia  Symptomatic anemia - Unclear etiology of her blood loss, ?related to extensive malignancy, FOBT negative, CT abdomen pelvis without bleeding, UA without blood. Responded appropriately to 3 units of packed red blood cells Hb 4.2 >> 8.1 >> 7.9. She had an episode of "pink urine", UA sent, reviewed with Urology, without evidence of bleeding. Discussed with [name] [name] over the phone, her Xarelto was resumed. She has remained stable without evidence of active bleeding and was discharged home in stable condition with close outpatient follow up.  Colorectal cancer, stage IV, with liver and lung metastasis and progression of  intra-abdominal masses, also with brain metastasis - Her primary oncologist discussed with patient regarding goals of care and approach patient with regard to hospice or palliative approach, however patient is against this. Goals of care - I discussed with patient bedside on admission regarding her tumor progression and extension of her metastasis, and progressive symptoms which are likely related to that, and the fact that this is not treatable. She wishes to remain a full code and does not want to think about hospice or palliative care at this point  Acute pancreatitis - ? Related to malignancy, no ductal dilatation on CT, LFTs normal. She was kept NPO, IVF, improved rapidly and tolerating a regular diet. Overall abdominal pain with significant improvement.  Hypercalcemia of malignancy - on Zometa as an outpatient, improved with fluids Abdominal pain - due to metastatic cancer, pancreatitis, improved LE swelling - She has a degree of dry swelling due to likely malnutrition. DVT US negative for DVT. On Xarelto anyway Tachycardia - sinus rhythm due to anemia and tumor burden. Improved with correction of her anemia Severe protein calorie malnutrition Hyponatremia - resolved with hydration History of PE in 2016 - resume Xarelto as above.    Discharge Instructions     Medication List    TAKE these medications        acetaminophen 500 MG tablet  Commonly known as:  TYLENOL  Take 500 mg by mouth every 6 (six) hours as needed for moderate pain or headache.     dexamethasone 2 MG tablet  Commonly known as:  DECADRON  Take 2mg  BID for 1 week, then 2 mg daily for 1 week, then 2mg  every other day  for one week then stop     fluconazole 100 MG tablet  Commonly known as:  DIFLUCAN  Take 1 tablet (100 mg total) by mouth daily. Take 2 tabs x 1, then 1 tab QD x 9 additional days.     HYDROmorphone 4 MG tablet  Commonly known as:  DILAUDID  Take 1 tablet (4 mg total) by mouth every 4 (four) hours as  needed for severe pain.     IRON PO  Take 1 tablet by mouth every morning.     pantoprazole 40 MG tablet  Commonly known as:  PROTONIX  Take 1 tablet (40 mg total) by mouth daily.     polyethylene glycol powder powder  Commonly known as:  GLYCOLAX/MIRALAX  TAKE 34 G (2 DOSES) BY MOUTH DAILY AS NEEDED FOR MILD CONSTIPATION OR MODERATE CONSTIPATION     vitamin B-12 500 MCG tablet  Commonly known as:  CYANOCOBALAMIN  Take 500 mcg by mouth daily.     XARELTO 20 MG Tabs tablet  Generic drug:  rivaroxaban  TAKE 1 TABLET BY MOUTH EVERY DAY WITH SUPPER           Follow-up Information    Follow up with [name] Merle, MD. Schedule an appointment as soon as possible for a visit in 1 week.   Specialties:  Hematology, Oncology   Contact information:   [address] [place] [address] [phone]      Allergies  Allergen Reactions  . Oxycontin [Oxycodone Hcl] Anaphylaxis, Hives and Itching  . Codeine Itching and Nausea And Vomiting    Consultations:  None   Procedures/Studies:  Mr [name] [name] Contrast  01/02/2016  CLINICAL DATA:  65 year old female with colon cancer and metastatic brain disease post stereotactic radiation therapy. 3 month restaging. Subsequent encounter. EXAM: MRI HEAD WITHOUT AND WITH CONTRAST TECHNIQUE: Multiplanar, multiecho pulse sequences of the brain and surrounding structures were obtained without and with intravenous contrast. CONTRAST:  20mL MULTIHANCE GADOBENATE DIMEGLUMINE 529 MG/ML IV SOLN COMPARISON:  11/25/2015 CT.  09/15/2015 and 09/13/2015 MR. FINDINGS: Peripheral enhancing centrally necrotic mass medial aspect of the posterior left frontal -parietal lobe has increased in size compared to 09/15/2015 exam. This currently measures 1.4 x 1.4 x 1 cm versus prior 1.3 x 1.1 x 0.8 cm. Significant decrease (although incomplete clearance) of surrounding vasogenic edema and mass effect. The patient appears to be on steroids which may contribute to the decreased  surrounding vasogenic edema. Left frontal 8 mm extra-axial enhancing lesion most suggestive of a meningioma without change. No new intracranial enhancing lesion. No acute infarct or intracranial hemorrhage. No hydrocephalus. Cervical spondylotic changes C4-5. Major intracranial vascular structures are patent. No acute orbital abnormality. IMPRESSION: Peripheral enhancing centrally necrotic mass medial aspect of the posterior left frontal -parietal lobe has increased in size compared to 09/15/2015 exam. This currently measures 1.4 x 1.4 x 1 cm versus prior 1.3 x 1.1 x 0.8 cm. Significant decrease (although incomplete clearance) of surrounding vasogenic edema and mass effect. The patient appears to be on steroids which may contribute to the decreased surrounding vasogenic edema. Left frontal 8 mm extra-axial enhancing lesion most suggestive of a meningioma without change. No new intracranial enhancing lesion. Electronically Signed   By: Genia Del M.D.   On: 01/02/2016 13:15   Ct Abdomen Pelvis W Contrast  01/16/2016  CLINICAL DATA:  Metastatic colon cancer, anemia, constipation EXAM: CT ABDOMEN AND PELVIS WITH CONTRAST TECHNIQUE: Multidetector CT imaging of the abdomen and pelvis was performed  using the standard protocol following bolus administration of intravenous contrast. CONTRAST:  163mL ISOVUE-300 IOPAMIDOL (ISOVUE-300) INJECTION 61% COMPARISON:  11/25/2015 FINDINGS: Lower chest:  Bilateral pulmonary nodules, including: --5 mm left lower lobe nodule (series 4/ image 13), unchanged --5 mm right lower lobe nodule (series 4/ image 21), new --9 mm nodule at the left lung base (series 4/ image 44), previously 6 mm Hepatobiliary: Progression of numerous widespread/multifocal hepatic metastases. Index lesion in the medial segment left hepatic lobe measures 5.6 x 7.7 cm (series 2/ image 26), previously 4.5 x 6.0 cm. Gallbladder is unremarkable. No intrahepatic or extrahepatic ductal dilatation. Pancreas: Fluid/  stranding adjacent to the pancreatic tail (series 2/ image 24), with possible additional stranding along the uncinate process and pancreaticoduodenal groove (series 2/ image 45), correlate for acute pancreatitis. Spleen: Surgically absent. Splenule in the left upper abdomen (series 2/ image 26). Adrenals/Urinary Tract: Adrenal glands are within normal limits. Right kidney is unremarkable, noting an extrarenal pelvis. Left kidney is notable for two nonobstructing calculi measuring up to 7 mm in the lower pole (series 2/image 40). No ureteral or bladder calculi.  No hydronephrosis. Bladder is within normal limits. Stomach/Bowel: Stomach is within normal limits. Inflammatory changes along the proximal duodenum (series 2/ image 46), suggesting duodenitis, although its possibly secondary to pancreatic inflammatory changes. No evidence of bowel obstruction. Normal appendix (series 2/ image 54). Status post partial colectomy with suture line in the mid abdomen (series 2/ image 48). Status post partial left hemicolectomy with suture line in the left pelvis (series 2/image 34). Associated 4.1 x 2.9 cm soft tissue mass at the surgical margin (series 2/image 73), previously 4.4 x 3.3 cm, stable versus mildly decreased. Extension posteriorly into the presacral region, where it measures 2.7 x 4.1 cm (series 62/image 70), previously 4.1 x 2.8 cm, unchanged. Extension into the pericolonic soft tissues with associated 3.1 x 2.2 cm soft tissue implant/node (series 2/image 36), previously 3.2 x 2.2 cm, unchanged. Vascular/Lymphatic: No evidence of abdominal aortic aneurysm. Additional 1.8 x 2.3 cm soft tissue implant/left common iliac node (series 2/image 63), previously 2.6 x 2.0 cm, stable versus mildly decreased. Reproductive: Status post hysterectomy. Other: Small volume of pelvic ascites, increased. Musculoskeletal: Degenerative changes of the visualized thoracolumbar spine, most prominent at L5-S1. IMPRESSION: Peripancreatic  fluid/inflammatory changes, correlate for acute pancreatitis. Additional inflammatory changes involving the proximal duodenum, suggesting duodenitis, possibly secondary. Status post left hemicolectomy. Associated 4.1 cm soft tissue mass at the surgical margin, stable versus mildly increased. Mass extends posteriorly into the presacral space. Adjacent pericolonic extension anteriorly/inferiorly. Associated 2.3 cm soft tissue implant/left common iliac node, mildly decreased. Progression of multifocal hepatic metastases, including a dominant 7.7 cm mass in the left hepatic lobe, increased. Progression of small pulmonary metastases in the bilateral lower lobes, measuring up to 9 mm. Additional ancillary findings as above. Electronically Signed   By: Julian Hy M.D.   On: 01/16/2016 17:24     Subjective: - no chest pain, shortness of breath, no abdominal pain, nausea or vomiting.    Discharge Exam: Filed Vitals:   01/17/16 2111 01/18/16 0533  BP: 120/68 102/63  Pulse: 106 95  Temp: 99.4 F (37.4 C) 97.9 F (36.6 C)  Resp: 18 24   Filed Vitals:   01/17/16 0359 01/17/16 1442 01/17/16 2111 01/18/16 0533  BP: 122/71 105/57 120/68 102/63  Pulse: 96 99 106 95  Temp: 98.4 F (36.9 C) 98.3 F (36.8 C) 99.4 F (37.4 C) 97.9 F (36.6 C)  TempSrc: Oral  Oral Oral Oral  Resp: 20 18 18 24   Height:      Weight:      SpO2: 100% 98% 100% 99%   General: Pt is alert, awake, not in acute distress Cardiovascular: RRR, S1/S2 +, no rubs, no gallops Respiratory: CTA bilaterally, no wheezing, no rhonchi Abdominal: Soft, NT, ND, bowel sounds + Extremities: no edema, no cyanosis   The results of significant diagnostics from this hospitalization (including imaging, microbiology, ancillary and laboratory) are listed below for reference.    Labs: BNP (last 3 results) No results for input(s): BNP in the last 8760 hours. Basic Metabolic Panel:  Recent Labs Lab 01/16/16 1154 01/17/16 0610  NA  133* 137  K 4.2 3.9  CL  --  107  CO2 21* 24  GLUCOSE 115 98  BUN 8.5 6  CREATININE 0.7 0.49  CALCIUM 11.5* 10.5*   Liver Function Tests:  Recent Labs Lab 01/16/16 1154  AST 25  ALT 21  ALKPHOS 188*  BILITOT <0.30  PROT 6.8  ALBUMIN 1.9*    Recent Labs Lab 01/16/16 1829  LIPASE 167*   No results for input(s): AMMONIA in the last 168 hours. CBC:  Recent Labs Lab 01/16/16 1153 01/17/16 0610 01/18/16 0500  WBC 16.1* 14.6* 14.1*  NEUTROABS 13.6*  --   --   HGB 4.2* 8.1* 7.9*  HCT 14.0* 24.1* 23.6*  MCV 73.3* 78.2 78.4  PLT 637* 594* 459*    Anemia work up  Recent Labs  01/16/16 1154  FERRITIN 79  TIBC 209*  IRON <11*   Urinalysis    Component Value Date/Time   COLORURINE YELLOW 01/18/2016 0753   APPEARANCEUR CLEAR 01/18/2016 0753   LABSPEC 1.014 01/18/2016 0753   LABSPEC 1.020 12/15/2015 1430   PHURINE 6.5 01/18/2016 0753   PHURINE 6.0 12/15/2015 1430   GLUCOSEU NEGATIVE 01/18/2016 0753   GLUCOSEU Negative 12/15/2015 1430   HGBUR TRACE* 01/18/2016 0753   HGBUR Moderate 12/15/2015 1430   BILIRUBINUR NEGATIVE 01/18/2016 0753   BILIRUBINUR Negative 12/15/2015 1430   KETONESUR NEGATIVE 01/18/2016 0753   KETONESUR Negative 12/15/2015 1430   PROTEINUR NEGATIVE 01/18/2016 0753   PROTEINUR Negative 12/15/2015 1430   UROBILINOGEN 0.2 12/15/2015 1430   UROBILINOGEN 0.2 11/13/2014 2008   NITRITE NEGATIVE 01/18/2016 0753   NITRITE Negative 12/15/2015 1430   LEUKOCYTESUR NEGATIVE 01/18/2016 0753   LEUKOCYTESUR Trace 12/15/2015 1430   Time coordinating discharge: Over 30 minutes  SIGNED:  Marzetta Board, MD  [facility] 01/18/2016, 4:25 PM Pager [phone]  If 7PM-7AM, please contact night-coverage [email or web address] Password [email or web address]

## 2016-01-19 LAB — TYPE AND SCREEN
ABO/RH(D): AB POS
Antibody Screen: NEGATIVE
Unit division: 0
Unit division: 0
Unit division: 0

## 2016-01-20 ENCOUNTER — Telehealth: Payer: Self-pay

## 2016-01-20 ENCOUNTER — Ambulatory Visit (HOSPITAL_BASED_OUTPATIENT_CLINIC_OR_DEPARTMENT_OTHER): Payer: Medicare Other | Admitting: Nurse Practitioner

## 2016-01-20 ENCOUNTER — Ambulatory Visit (HOSPITAL_BASED_OUTPATIENT_CLINIC_OR_DEPARTMENT_OTHER): Payer: Medicare Other

## 2016-01-20 VITALS — BP 123/61 | HR 128 | Temp 98.4°F | Resp 18 | Ht 60.5 in | Wt 154.4 lb

## 2016-01-20 DIAGNOSIS — C19 Malignant neoplasm of rectosigmoid junction: Secondary | ICD-10-CM | POA: Diagnosis present

## 2016-01-20 DIAGNOSIS — C787 Secondary malignant neoplasm of liver and intrahepatic bile duct: Secondary | ICD-10-CM

## 2016-01-20 DIAGNOSIS — R609 Edema, unspecified: Secondary | ICD-10-CM

## 2016-01-20 DIAGNOSIS — C189 Malignant neoplasm of colon, unspecified: Secondary | ICD-10-CM

## 2016-01-20 DIAGNOSIS — Z95828 Presence of other vascular implants and grafts: Secondary | ICD-10-CM

## 2016-01-20 DIAGNOSIS — E46 Unspecified protein-calorie malnutrition: Secondary | ICD-10-CM

## 2016-01-20 DIAGNOSIS — R600 Localized edema: Secondary | ICD-10-CM

## 2016-01-20 DIAGNOSIS — K1230 Oral mucositis (ulcerative), unspecified: Secondary | ICD-10-CM

## 2016-01-20 DIAGNOSIS — E8809 Other disorders of plasma-protein metabolism, not elsewhere classified: Secondary | ICD-10-CM

## 2016-01-20 LAB — COMPREHENSIVE METABOLIC PANEL
ALBUMIN: 1.8 g/dL — AB (ref 3.5–5.0)
ALK PHOS: 195 U/L — AB (ref 40–150)
ALT: 27 U/L (ref 0–55)
ANION GAP: 8 meq/L (ref 3–11)
AST: 39 U/L — AB (ref 5–34)
BUN: 5.4 mg/dL — AB (ref 7.0–26.0)
CALCIUM: 11 mg/dL — AB (ref 8.4–10.4)
CHLORIDE: 101 meq/L (ref 98–109)
CO2: 23 mEq/L (ref 22–29)
CREATININE: 0.6 mg/dL (ref 0.6–1.1)
EGFR: >90 mL/min/{1.73_m2} (ref >90)
Glucose: 114 mg/dl (ref 70–140)
POTASSIUM: 3.6 meq/L (ref 3.5–5.1)
Sodium: 132 mEq/L — ABNORMAL LOW (ref 136–145)
Total Bilirubin: 0.46 mg/dL (ref 0.20–1.20)
Total Protein: 6.4 g/dL (ref 6.4–8.3)

## 2016-01-20 LAB — CBC WITH DIFFERENTIAL/PLATELET
BASO%: 0.8 % (ref 0.0–2.0)
BASOS ABS: 0.1 10*3/uL (ref 0.0–0.1)
EOS%: 0.1 % (ref 0.0–7.0)
Eosinophils Absolute: 0 10*3/uL (ref 0.0–0.5)
HEMATOCRIT: 24.8 % — AB (ref 34.8–46.6)
HEMOGLOBIN: 7.8 g/dL — AB (ref 11.6–15.9)
LYMPH#: 0.7 10*3/uL — AB (ref 0.9–3.3)
LYMPH%: 4.5 % — ABNORMAL LOW (ref 14.0–49.7)
MCH: 25.1 pg (ref 25.1–34.0)
MCHC: 31.5 g/dL (ref 31.5–36.0)
MCV: 79.7 fL (ref 79.5–101.0)
MONO#: 1.2 10*3/uL — AB (ref 0.1–0.9)
MONO%: 8.1 % (ref 0.0–14.0)
NEUT#: 13 10*3/uL — ABNORMAL HIGH (ref 1.5–6.5)
NEUT%: 86.5 % — AB (ref 38.4–76.8)
PLATELETS: 440 10*3/uL — AB (ref 145–400)
RBC: 3.11 10*6/uL — ABNORMAL LOW (ref 3.70–5.45)
RDW: 20 % — AB (ref 11.2–14.5)
WBC: 15 10*3/uL — ABNORMAL HIGH (ref 3.9–10.3)

## 2016-01-20 MED ORDER — FLUCONAZOLE 100 MG PO TABS: 100.0000 mg | ORAL_TABLET | Freq: Every day | ORAL | Status: DC

## 2016-01-20 MED ORDER — HEPARIN SOD (PORK) LOCK FLUSH 100 UNIT/ML IV SOLN
500.0000 [IU] | Freq: Once | INTRAVENOUS | Status: DC | PRN
  Filled 2016-01-20: qty 5

## 2016-01-20 MED ORDER — SODIUM CHLORIDE 0.9 % IJ SOLN
10.0000 mL | INTRAMUSCULAR | Status: DC | PRN
  Administered 2016-01-20: 10 mL via INTRAVENOUS
  Filled 2016-01-20: qty 10

## 2016-01-20 MED ORDER — MAGIC MOUTHWASH W/LIDOCAINE: 5.0000 mL | Freq: Four times a day (QID) | ORAL | Status: DC | PRN

## 2016-01-20 MED FILL — FLUCONAZOLE 100 MG TABLET: 100 | 10 days supply | Qty: 10 | Fill #0

## 2016-01-20 MED FILL — MAGIC MOUTHWASH W/LIDO 1:1: 6 days supply | Qty: 120 | Fill #0

## 2016-01-20 NOTE — Telephone Encounter (Signed)
Pt lvm requesting a call back for the same problem with her feet swelling, not feeling good and no appetite.  Pt got out of hospital Sunday. Was feeling pretty good. Her feet had gone down.  Yesterday, her feet swelled. Family put ice on feet and elevated it. It helped a little. This morning the feet swollen again. She does not understand why her feet did not start swelling until after she fell 2 saturdays ago No appetite, is forcing herself to eat. Is constipated, LBM last night with 2 dulcolax. Abdomen is cramping.  She is very tired.  Will set her up to see [facility] today.

## 2016-01-20 NOTE — Patient Instructions (Signed)

## 2016-01-22 ENCOUNTER — Encounter: Payer: Self-pay | Admitting: Nurse Practitioner

## 2016-01-22 ENCOUNTER — Telehealth: Payer: Self-pay | Admitting: Nurse Practitioner

## 2016-01-22 ENCOUNTER — Telehealth: Payer: Self-pay | Admitting: *Deleted

## 2016-01-22 ENCOUNTER — Telehealth: Payer: Self-pay | Admitting: Oncology

## 2016-01-22 NOTE — Telephone Encounter (Signed)
Attempted to call pt back; but no answer. Unable to leave voice mail.  No answering service.   Will cancel all appts for 7/24; and switch all appts to 8/1. New POF sent. Scheduling alerted; and will then notify patient of all changes.

## 2016-01-22 NOTE — Assessment & Plan Note (Signed)
Patient states that she tripped and fell this past weekend; and within 24 hours.  She noted that both of her feet or edematous.  She denies any known injury or trauma specifically to her lower extremities or feet with the fall.  She also denies any loss of consciousness with fall.  She states that she has no calf pain.  She also denies any chest pain, chest pressure, shortness of breath, or pain with inspiration.  Exam today reveals +2 edema to bilateral lower extremities; with no calf tenderness.  It does appear that the left lower extremity is slightly larger than the right.  Patient will be transported to the emergency department for further evaluation and management.  Differential diagnosis for new onset peripheral edema would include need to rule out DVT; despite continued use of Xarelto oral therapy.  Also, high possibility that patient's new onset lower extremity edema is secondary to progressive disease. ______________________________________  Update: Patient underwent a bilateral Doppler ultrasound of her lower legs while admitted on 01/17/2016; which was negative for DVT.  Most likely, patient's continued/chronic edema to her bilateral lower extremities is secondary to hypoalbuminemia.  Patient was encouraged to elevate her legs above the level of for heart when arresting; and to also consider compression stockings as well.  She was encouraged to remain as active as possible as well.

## 2016-01-22 NOTE — Progress Notes (Signed)
SYMPTOM MANAGEMENT CLINIC    Chief Complaint: Abdominal pain  HPI:  [name] [name] 65 y.o. female diagnosed with colorectal cancer; with both brain and liver metastasis.  Currently undergoing Zometa infusions only.   Patient last received chemotherapy in 2016; and received her final radiation treatments in March 2017.  She currently receives Zometa on an every three-month basis.  Her last Zometa infusion was 12/17/2015.  Patient presented to the [facility] [place] today with complaint of nausea, poor oral intake, dehydration, and aggressive left lower quadrant abdominal discomfort.  She is also noted increased edema to her bilateral lower extremities as well.  She denies any recent fevers or chills.  She continues to take Xarelto as directed.  See further notes for details of today's visit.  Due to all of patient's issues-patient will be transported directly to the emergency department for further evaluation and management.  Brief history.  Report were called to the emergency department charge nurse; prior to the patient being transported via wheelchair to the emergency department per the [place].  Also, reviewed all findings with Dr. Burr Medico; who was in complete agreement with the plan to transfer the patient to the emergency department for further evaluation and management.  Discussion with both the patient and her family regarding option of palliative care and for sure hospice-but patient stated that she was not ready to consider those options.  Also, reviewed CODE STATUS options; and patient confirmed that she wants to remain a full code for the time being.  Patient is scheduled to return for labs, flush, and a visit on 01/26/2016.  Oncology History   Colorectal cancer, stage IV, obstructing s/p colonic diversion   Staging form: Colon and Rectum, AJCC 7th Edition     Clinical: Stage IVA (TX, N0, M1a) - Signed by Concha Norway, MD on 09/15/2013       Colorectal cancer, stage  IV, obstructing s/p stent x2, ostomy, then [facility] 03/06/2015   05/22/2013 - 05/27/2013 Hospital Admission A/w abdominal distention and constipation for 2 days CT scan of her abdomen and pelvis showed area of bowel wall thickening at the rectosigmoid junction concerning for malignancy.   05/22/2013 Imaging CT of Abdomen: bowel wall thickening with shouldering of the proximal and distal margins, involving therectosigmoid junction, supicous liver spleen mets. Colonsocopy recommended.    05/23/2013 Tumor Marker CEA 225.7   05/24/2013 Imaging MRI of abdomen: 3.7 x 4.4 cm enhancing lesion along the superior spleen, indeterminate but worrisome for metastasis.2.7 x 3.9 cm enhancing lesion in the medial segment left hepatic lobe,    05/25/2013 Pathology Results Diagnosis Rectum, biopsy, proximal - INVASIVE ADENOCARCINOMA.   05/25/2013 Procedure Flex sig: (Dr. Ardis Hughs). 5-6 cm obstructing rectosigmoid mass distal end 10 cm from anal verge; mass biopsed and then stented 9 cm long, 22 cm diameter uncovered SEM.    05/25/2013 Initial Diagnosis Colorectal cancer, stage IV   06/25/2013 Procedure R port a cath placement.   07/09/2013 - 08/20/2013 Chemotherapy FOLFOX q 2 weeks started.  Not elgible for clinical trial and bevacimab based on stent and high risk for perforation. She received a total of 4 cycles.    07/30/2013 Tumor Marker KRAS positive.  p53, APC identified. Mismatch Repair (MMR) preserved.    08/06/2013 Adverse Reaction Complains of darkening of her hands with peeling.    08/27/2013 Family History She was seen by Genetic couselor.  She declined testing today, but will call and reschedule an appointment should she decide to pursue testing.  09/07/2013 Imaging CT abdomen: Mixed response to therapy. Response to therapy of hepatic metastasis. right hepatic hemangiomas are again identified. Other too small to characterize liver lesions are felt to be similar but are indeterminate.. Enlargement of a splenic lesions    09/13/2013 - 06/10/2014 Chemotherapy Second line chemo FOLFIRI for a total of 16 cycles    09/13/2013 Tumor Marker CEA 33.5   09/13/2013 Treatment Plan Change Reviewed scans consistent with mixed response.  Switch to FOLFIRI, she received a total of 16 cycles, with a few breaks due to hospitalization and chemo holiday.    10/29/2013 Adverse Reaction Patient reports intense abdominal cramping lasting the first few days on chemotherapy.  Starting Hyoscyamine 0.125 mg q 6 hours prn.    11/15/2013 - 11/15/2013 Hospital Admission Patient presented to ED with migrated stent.  Stent retrieved by Dr. Ardis Hughs.  Imaging negative for perforation.   Discussed in conference with referral to Dr. Zella Richer made.    11/23/2013 Imaging CT Chest. 1. Stable left lower lobe 5 mm pulmonary nodule. 2. Interval increase in size splenic mass is concerning formetastatic lesion.3. Interval calcification of the left hepatic lobe metastasis. Noevidence of active residual disease by CT.   07/01/2014 Imaging CT restaging scan showed stable disease, improved liver lesion.    07/16/2014 - 10/24/2014 Chemotherapy maintenance chemo with capacitain '1000mg'$ /m2 ('1800mg'$ ) bid started, dose decresed to '1500mg'$  bid on C1D8 due to tolerance issue. Pt declined surgery. Chemo held due to hospitalization and disease progression.    08/16/2014 - 08/20/2014 Hospital Admission She was admitted for bowel obstruction from sigmoid colon mass. She declined surgery, and had sigmoid colon stent placement. Her symptoms resolved afterwards.   10/25/2014 - 10/31/2014 Hospital Admission Admitted for bowel obstruction, CT scan showed sigmoid colon tumor growth through the stent, pt underwent Transverse loop colostomy on 10/28/2014.    11/13/2014 - 11/18/2014 Hospital Admission she was admitted for transverse loop colostomy prolapse, managed conservatively. CT chest showed RLL PE, discharged home on Xarelto    01/03/2015 - 01/07/2015 Hospital Admission palliative repair of loop  colostomy prolapse with partial colectomy 01/03/15   03/05/2015 Imaging CT chest, abdomen and pelvis showed known colorectal  Them, multiple new liver metastasis,. Noticed enhancing splenic lesion continues to enlarge, nonobstructive calculus in the left renal collecting system.   03/06/2015 Surgery Laparoscopic low anterior rectosigmoid to resection, splenectomy, takedown loop colostomy, vaginal cuff peritoneal biopsy, colon polypectomyx4    03/06/2015 Pathology Results Well differentiated adenocarcinoma in rectosigmoid colon, 32 lymph nodes were negative, vaginal cuff biopsy was positive for metastatic adenocarcinoma, [place] with plasma cell infiltrate and fibrosis, no metastatic carcinoma.   09/12/2015 - 09/17/2015 Hospital Admission Pt presented with b/l leg weakness, was admitted for brain metastasis    09/13/2015 Imaging Brain MRI with and without contrast showed a 1.7 cm left frontoparietal mass with moderate vasogenic edema, consistent with solitary metastasis   09/18/2015 - 09/18/2015 Radiation Therapy SRS to solitary brain met    09/18/2015 -  Radiation Therapy Stereotactic radiation to oligo brain metastases    Review of Systems  Constitutional: Positive for fever, chills, weight loss and malaise/fatigue.  Gastrointestinal: Positive for nausea and abdominal pain. Negative for vomiting, diarrhea, constipation, blood in stool and melena.  Neurological: Positive for weakness.  All other systems reviewed and are negative.   Past Medical History  Diagnosis Date  . Colon cancer ([place])     dx'd 2014  . Heart murmur     ? as a child   . Pulmonary embolism (  [place]) 11/2014   . Anemia     hx of   . S/P radiation therapy 09/18/15    SRS left frontoparietal 20Gy    Past Surgical History  Procedure Laterality Date  . Abdominal hysterectomy  2005  . Cesarean section  1976  . Flexible sigmoidoscopy N/A 05/25/2013    Procedure: FLEXIBLE SIGMOIDOSCOPY;  Surgeon: [name], MD;  Location: [facility]  END[facility]COPY;  Service: Endoscopy;  Laterality: N/A;  needs floro  . Colonic stent placement N/A 05/25/2013    Procedure: COLONIC STENT PLACEMENT;  Surgeon: [name], MD;  Location: [facility] END[facility];  Service: Endoscopy;  Laterality: N/A;  . Portacath placement Right 06/25/2013    Procedure: ULTRA SOUND GUIDED INSERTION PORT-A-CATH;  Surgeon: [name], MD;  Location: [facility];  Service: General;  Laterality: Right;  . Flexible sigmoidoscopy N/A 08/19/2014    Procedure: FLEXIBLE SIGMOIDOSCOPY;  Surgeon: Gatha Mayer, MD;  Location: [facility];  Service: Endoscopy;  Laterality: N/A;  . Colonic stent placement N/A 08/19/2014    Procedure: COLONIC STENT PLACEMENT;  Surgeon: Gatha Mayer, MD;  Location: [facility];  Service: Endoscopy;  Laterality: N/A;  . Flexible sigmoidoscopy N/A 08/19/2014    Procedure: FLEXIBLE SIGMOIDOSCOPY;  Surgeon: Gatha Mayer, MD;  Location: [facility];  Service: Endoscopy;  Laterality: N/A;  . Colonic stent placement N/A 08/19/2014    Procedure: COLONIC STENT PLACEMENT;  Surgeon: Gatha Mayer, MD;  Location: [facility];  Service: Endoscopy;  Laterality: N/A;  . Colon resection N/A 10/28/2014    Procedure: Transverse loop colostomy;  Surgeon: [name], MD;  Location: [facility];  Service: General;  Laterality: N/A;  . Colostomy revision N/A 01/03/2015    Procedure: REVISION OF TRANSVERSE LOOP COLOSTOMY WITH PARTIAL  COLECTOMY;  Surgeon: [name], MD;  Location: [facility];  Service: General;  Laterality: N/A;  . Laparoscopic low anterior resection  03/06/2015    03/06/2015  . Laparoscopic splenectomy  03/06/2015  . Colon resection N/A 03/06/2015    Procedure: LAPAROSCOPIC takedown loop colostomy ;  Surgeon: Michael Boston, MD;  Location: [facility];  Service: General;  Laterality: N/A;  . 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 Bowel  resection  03/06/2015    Procedure: LOW ANTERIOR BOWEL RESECTION, rigid proctoscopy;  Surgeon: Michael Boston, MD;  Location: [facility];  Service: General;;  . 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; Pulmonary embolism ([place]); Long-term (current) use of anticoagulants; Parastomal hernia; Brain metastases ([place]); Right leg weakness; Metastatic colon cancer to liver ([place]); Anemia of chronic disease; Chronic pulmonary embolism ([place]); Ataxia; Abnormality of gait; Hemiparesis ([place]); Acute septic pulmonary embolism without acute cor pulmonale ([place]); Benign essential HTN; Recurrent aphthous stomatitis; Absolute anemia; Hypercalcemia of malignancy; Hyponatremia; Right sided weakness; Protein-calorie malnutrition, moderate ([place]); Pressure ulcer; Port catheter in place; Abdominal pain; 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 [facility]); Nausea without vomiting; Dehydration; Hypoalbuminemia due to protein-calorie malnutrition ([place]); Abdominal pain, acute, left lower quadrant; Peripheral edema; Symptomatic anemia; and Severe anemia on her problem list.    is allergic to oxycontin and codeine.  Medication List       This list is accurate as of: 01/20/16 11:59 PM.  Always use your most recent med list.               acetaminophen 500 MG tablet  Commonly known as:  TYLENOL  Take 500 mg by mouth every 6 (six) hours as needed for moderate pain or headache.     dexamethasone 2 MG tablet  Commonly known as:  DECADRON  Take '2mg'$  BID for 1 week, then 2 mg daily for 1 week, then '2mg'$  every other day for one week then stop     fluconazole 100 MG tablet  Commonly known as:  DIFLUCAN  Take 1 tablet (100 mg  total) by mouth daily.     HYDROmorphone 4 MG tablet  Commonly known as:  DILAUDID  Take 1 tablet (4 mg total) by mouth every 4 (four) hours as needed for severe pain.     IRON PO  Take 1 tablet by mouth every morning.     magic mouthwash w/lidocaine Soln  Take 5 mLs by mouth 4 (four) times daily as needed for mouth pain.     pantoprazole 40 MG tablet  Commonly known as:  PROTONIX  Take 1 tablet (40 mg total) by mouth daily.     polyethylene glycol powder powder  Commonly known as:  GLYCOLAX/MIRALAX  TAKE 34 G (2 DOSES) BY MOUTH DAILY AS NEEDED FOR MILD CONSTIPATION OR MODERATE CONSTIPATION     vitamin B-12 500 MCG tablet  Commonly known as:  CYANOCOBALAMIN  Take 500 mcg by mouth daily.     XARELTO 20 MG Tabs tablet  Generic drug:  rivaroxaban  TAKE 1 TABLET BY MOUTH EVERY DAY WITH SUPPER         PHYSICAL EXAMINATION  Oncology Vitals 01/20/2016 01/18/2016  Height 154 cm -  Weight 70.035 kg -  Weight (lbs) 154 lbs 6 oz -  BMI (kg/m2) 29.66 kg/m2 -  Temp 98.4 97.9  Pulse 128 95  Resp 18 24  SpO2 100 99  BSA (m2) 1.73 m2 -   BP Readings from Last 2 Encounters:  01/20/16 123/61  01/18/16 102/63    Physical Exam  Constitutional: She is oriented to person, place, and time. She appears unhealthy.  HENT:  Head: Normocephalic and atraumatic.  Mouth/Throat: Oropharynx is clear and moist.  Eyes: Conjunctivae and EOM are normal. Pupils are equal, round, and reactive to light. Right eye exhibits no discharge. Left eye exhibits no discharge. No scleral icterus.  Neck: Normal range of motion. Neck supple. No JVD present. No tracheal deviation present. No thyromegaly present.  Cardiovascular: Normal heart sounds and intact distal pulses.   Tachycardia  Pulmonary/Chest: Effort normal and breath sounds normal. No respiratory distress. She has no wheezes. She has no rales. She exhibits no tenderness.  Abdominal: Soft. She exhibits no distension and no mass. There is no  tenderness. There is no rebound and no guarding.  Musculoskeletal: Normal range of motion. She exhibits edema. She exhibits no tenderness.  +2 edema to bilateral lower extremities; with the left slightly greater than the right  Lymphadenopathy:    She has no cervical adenopathy.  Neurological: She is alert and oriented to person, place, and time. Gait normal.  Skin: Skin is warm and dry. No rash noted. No erythema. No pallor.  Psychiatric: Affect normal.  Nursing note and vitals reviewed.   LABORATORY DATA:. Appointment on 01/20/2016  Component Date Value Ref Range Status  .  WBC 01/20/2016 15.0* 3.9 - 10.3 10e3/uL Final  . NEUT# 01/20/2016 13.0* 1.5 - 6.5 10e3/uL Final  . HGB 01/20/2016 7.8* 11.6 - 15.9 g/dL Final  . HCT 01/20/2016 24.8* 34.8 - 46.6 % Final  . Platelets 01/20/2016 440* 145 - 400 10e3/uL Final  . MCV 01/20/2016 79.7  79.5 - 101.0 fL Final  . MCH 01/20/2016 25.1  25.1 - 34.0 pg Final  . MCHC 01/20/2016 31.5  31.5 - 36.0 g/dL Final  . RBC 01/20/2016 3.11* 3.70 - 5.45 10e6/uL Final  . RDW 01/20/2016 20.0* 11.2 - 14.5 % Final  . lymph# 01/20/2016 0.7* 0.9 - 3.3 10e3/uL Final  . MONO# 01/20/2016 1.2* 0.1 - 0.9 10e3/uL Final  . Eosinophils Absolute 01/20/2016 0.0  0.0 - 0.5 10e3/uL Final  . Basophils Absolute 01/20/2016 0.1  0.0 - 0.1 10e3/uL Final  . NEUT% 01/20/2016 86.5* 38.4 - 76.8 % Final  . LYMPH% 01/20/2016 4.5* 14.0 - 49.7 % Final  . MONO% 01/20/2016 8.1  0.0 - 14.0 % Final  . EOS% 01/20/2016 0.1  0.0 - 7.0 % Final  . BASO% 01/20/2016 0.8  0.0 - 2.0 % Final  . Sodium 01/20/2016 132* 136 - 145 mEq/L Final  . Potassium 01/20/2016 3.6  3.5 - 5.1 mEq/L Final  . Chloride 01/20/2016 101  98 - 109 mEq/L Final  . CO2 01/20/2016 23  22 - 29 mEq/L Final  . Glucose 01/20/2016 114  70 - 140 mg/dl Final   Glucose reference range is for nonfasting patients. Fasting glucose reference range is 70- 100.  [place] BUN 01/20/2016 5.4* 7.0 - 26.0 mg/dL Final  . Creatinine 01/20/2016  0.6  0.6 - 1.1 mg/dL Final  . Total Bilirubin 01/20/2016 0.46  0.20 - 1.20 mg/dL Final  . Alkaline Phosphatase 01/20/2016 195* 40 - 150 U/L Final  . AST 01/20/2016 39* 5 - 34 U/L Final  . ALT 01/20/2016 27  0 - 55 U/L Final  . Total Protein 01/20/2016 6.4  6.4 - 8.3 g/dL Final  . Albumin 01/20/2016 1.8* 3.5 - 5.0 g/dL Final  . Calcium 01/20/2016 11.0* 8.4 - 10.4 mg/dL Final  . Anion Gap 01/20/2016 8  3 - 11 mEq/L Final  . EGFR 01/20/2016 >90  >90 ml/min/1.73 m2 Final   eGFR is calculated using the CKD-EPI Creatinine Equation (2009)  Admission on 01/16/2016, Discharged on 01/18/2016  Component Date Value Ref Range Status  . Order Confirmation 01/16/2016 ORDER PROCESSED BY BLOOD BANK   Final  . Fecal Occult Bld 01/16/2016 NEGATIVE  NEGATIVE Final  . ABO/RH(D) 01/16/2016 AB POS   Final  . Antibody Screen 01/16/2016 NEG   Final  . Sample Expiration 01/16/2016 01/19/2016   Final  . Unit Number 01/16/2016 [id number]   Final  . Blood Component Type 01/16/2016 RED CELLS,LR   Final  . Unit division 01/16/2016 00   Final  . Status of Unit 01/16/2016 ISSUED,FINAL   Final  . Transfusion Status 01/16/2016 OK TO TRANSFUSE   Final  . Crossmatch Result 01/16/2016 Compatible   Final  . Unit Number 01/16/2016 [id number]   Final  . Blood Component Type 01/16/2016 RED CELLS,LR   Final  . Unit division 01/16/2016 00   Final  . Status of Unit 01/16/2016 ISSUED,FINAL   Final  . Transfusion Status 01/16/2016 OK TO TRANSFUSE   Final  . Crossmatch Result 01/16/2016 Compatible   Final  . Unit Number 01/16/2016 [id number]   Final  . Blood Component Type 01/16/2016 RED CELLS,LR  Final  . Unit division 01/16/2016 00   Final  . Status of Unit 01/16/2016 ISSUED,FINAL   Final  . Transfusion Status 01/16/2016 OK TO TRANSFUSE   Final  . Crossmatch Result 01/16/2016 Compatible   Final  . Color, Urine 01/16/2016 YELLOW  YELLOW Final  . APPearance 01/16/2016 CLEAR  CLEAR Final  . Specific Gravity, Urine  01/16/2016 1.023  1.005 - 1.030 Final  . pH 01/16/2016 6.5  5.0 - 8.0 Final  . Glucose, UA 01/16/2016 NEGATIVE  NEGATIVE mg/dL Final  . Hgb urine dipstick 01/16/2016 NEGATIVE  NEGATIVE Final  . Bilirubin Urine 01/16/2016 NEGATIVE  NEGATIVE Final  . Ketones, ur 01/16/2016 NEGATIVE  NEGATIVE mg/dL Final  . Protein, ur 01/16/2016 NEGATIVE  NEGATIVE mg/dL Final  . Nitrite 01/16/2016 NEGATIVE  NEGATIVE Final  . Leukocytes, UA 01/16/2016 TRACE* NEGATIVE Final  . Lipase 01/16/2016 167* 11 - 51 U/L Final  . Squamous Epithelial / LPF 01/16/2016 0-5* NONE SEEN Final  . WBC, UA 01/16/2016 0-5  0 - 5 WBC/hpf Final  . RBC / HPF 01/16/2016 0-5  0 - 5 RBC/hpf Final  . Bacteria, UA 01/16/2016 RARE* NONE SEEN Final  . WBC 01/17/2016 14.6* 4.0 - 10.5 K/uL Final  . RBC 01/17/2016 3.08* 3.87 - 5.11 MIL/uL Final  . Hemoglobin 01/17/2016 8.1* 12.0 - 15.0 g/dL Final  . HCT 01/17/2016 24.1* 36.0 - 46.0 % Final  . MCV 01/17/2016 78.2  78.0 - 100.0 fL Final  . MCH 01/17/2016 26.3  26.0 - 34.0 pg Final  . MCHC 01/17/2016 33.6  30.0 - 36.0 g/dL Final  . RDW 01/17/2016 17.2* 11.5 - 15.5 % Final  . Platelets 01/17/2016 594* 150 - 400 K/uL Final  . Sodium 01/17/2016 137  135 - 145 mmol/L Final  . Potassium 01/17/2016 3.9  3.5 - 5.1 mmol/L Final  . Chloride 01/17/2016 107  101 - 111 mmol/L Final  . CO2 01/17/2016 24  22 - 32 mmol/L Final  . Glucose, Bld 01/17/2016 98  65 - 99 mg/dL Final  . BUN 01/17/2016 6  6 - 20 mg/dL Final  . Creatinine, Ser 01/17/2016 0.49  0.44 - 1.00 mg/dL Final  . Calcium 01/17/2016 10.5* 8.9 - 10.3 mg/dL Final  . GFR calc non Af Amer 01/17/2016 >60  >60 mL/min Final  . GFR calc Af Amer 01/17/2016 >60  >60 mL/min Final   Comment: (NOTE) The eGFR has been calculated using the CKD EPI equation. This calculation has not been validated in all clinical situations. eGFR's persistently <60 mL/min signify possible Chronic Kidney Disease.   . Anion gap 01/17/2016 6  5 - 15 Final  . WBC  01/18/2016 14.1* 4.0 - 10.5 K/uL Final  . RBC 01/18/2016 3.01* 3.87 - 5.11 MIL/uL Final  . Hemoglobin 01/18/2016 7.9* 12.0 - 15.0 g/dL Final  . HCT 01/18/2016 23.6* 36.0 - 46.0 % Final  . MCV 01/18/2016 78.4  78.0 - 100.0 fL Final  . MCH 01/18/2016 26.2  26.0 - 34.0 pg Final  . MCHC 01/18/2016 33.5  30.0 - 36.0 g/dL Final  . RDW 01/18/2016 17.7* 11.5 - 15.5 % Final  . Platelets 01/18/2016 459* 150 - 400 K/uL Final  . Color, Urine 01/18/2016 YELLOW  YELLOW Final  . APPearance 01/18/2016 CLEAR  CLEAR Final  . Specific Gravity, Urine 01/18/2016 1.014  1.005 - 1.030 Final  . pH 01/18/2016 6.5  5.0 - 8.0 Final  . Glucose, UA 01/18/2016 NEGATIVE  NEGATIVE mg/dL Final  . Hgb urine dipstick  01/18/2016 TRACE* NEGATIVE Final  . Bilirubin Urine 01/18/2016 NEGATIVE  NEGATIVE Final  . Ketones, ur 01/18/2016 NEGATIVE  NEGATIVE mg/dL Final  . Protein, ur 40/52/6092 NEGATIVE  NEGATIVE mg/dL Final  . Nitrite 56/63/5038 NEGATIVE  NEGATIVE Final  . Leukocytes, UA 01/18/2016 NEGATIVE  NEGATIVE Final  . Squamous Epithelial / LPF 01/18/2016 0-5* NONE SEEN Final  . WBC, UA 01/18/2016 0-5  0 - 5 WBC/hpf Final  . RBC / HPF 01/18/2016 0-5  0 - 5 RBC/hpf Final  . Bacteria, UA 01/18/2016 RARE* NONE SEEN Final  . Urine-Other 01/18/2016 MUCOUS PRESENT   Final    RADIOGRAPHIC STUDIES: No results found.  ASSESSMENT/PLAN:    Peripheral edema Patient states that she tripped and fell this past weekend; and within 24 hours.  She noted that both of her feet or edematous.  She denies any known injury or trauma specifically to her lower extremities or feet with the fall.  She also denies any loss of consciousness with fall.  She states that she has no calf pain.  She also denies any chest pain, chest pressure, shortness of breath, or pain with inspiration.  Exam today reveals +2 edema to bilateral lower extremities; with no calf tenderness.  It does appear that the left lower extremity is slightly larger than the  right.  Patient will be transported to the emergency department for further evaluation and management.  Differential diagnosis for new onset peripheral edema would include need to rule out DVT; despite continued use of Xarelto oral therapy.  Also, high possibility that patient's new onset lower extremity edema is secondary to progressive disease. ______________________________________  Update: Patient underwent a bilateral Doppler ultrasound of her lower legs while admitted on 01/17/2016; which was negative for DVT.  Most likely, patient's continued/chronic edema to her bilateral lower extremities is secondary to hypoalbuminemia.  Patient was encouraged to elevate her legs above the level of for heart when arresting; and to also consider compression stockings as well.  She was encouraged to remain as active as possible as well.    Hypoalbuminemia due to protein-calorie malnutrition ([facility]) Albumin remains low and has slightly decreased down to 1.8 today.  Patient continues with chronic bilateral lower extremity edema.  Doppler ultrasound of bilateral lower legs was negative for DVT.  Patient was advised to elevate her legs above the heart whenever she is at rest and try compression stockings patches also encouraged remain as active as possible.  Patient was also encouraged to push protein in her diet is much as possible.  Hypercalcemia of malignancy Patient last received Zometa for hypercalcemia approximately one month ago.  Corrected calcium today was 12.76.  Her hypercalcemia improved with IV fluid rehydration while admitted.  We'll continue to monitor closely.  Colorectal cancer, stage IV, obstructing s/p stent x2, ostomy, then LAR 03/06/2015 Patient last received chemotherapy in 2016; and received her final radiation treatments in March 2017.  She currently receives Zometa on an every three-month basis.  Her last Zometa infusion was 12/17/2015.  Patient presented to the cancer Center today with  complaint of nausea, poor oral intake, dehydration, and aggressive left lower quadrant abdominal discomfort.  She is also noted increased edema to her bilateral lower extremities as well.  She denies any recent fevers or chills.  She continues to take Xarelto as directed.  See further notes for details of today's visit.  Due to all of patient's issues-patient will be transported directly to the emergency department for further evaluation and management.  Brief  history.  Report were called to the emergency department charge nurse; prior to the patient being transported via wheelchair to the emergency department per the [place].  Also, reviewed all findings with Dr. Burr Medico; who was in complete agreement with the plan to transfer the patient to the emergency department for further evaluation and management.  Discussion with both the patient and her family regarding option of palliative care and for sure hospice-but patient stated that she was not ready to consider those options.  Also, reviewed CODE STATUS options; and patient confirmed that she wants to remain a full code for the time being.  Patient is scheduled to return for labs, flush, and a visit on 01/26/2016. ________________________________  Update: Patient presented back to the [facility] today for follow-up of her chronic lower extremity edema.  See further notes for details.  Per Dr. Burr Medico- we will cancel all appointments scheduled for 01/26/2016; and instead see the patient for labs, flush, and a visit on 02/03/2016.  Patient is to call in the interim with any new worries or concerns whatsoever.        Patient stated understanding of all instructions; and was in agreement with this plan of care. The patient knows to call the clinic with any problems, questions or concerns.   Total time spent with patient was 25 minutes;  with greater than 75 percent of that time spent in face to face counseling regarding patient's  symptoms,  and coordination of care and follow up.  Disclaimer:This dictation was prepared with Dragon/digital dictation along with [employer]. Any transcriptional errors that result from this process are unintentional.  Drue Second, NP 01/22/2016    Addendum I have seen the patient, examined her. I agree with the assessment and and plan and have edited the notes.   I had a long conversation with Tymika regarding the goal of care. Her overall condition has significantly deteriorated lately due to her cancer progression. She is not interested in chemotherapy. She agrees with the goal of care is palliation, to preserve her quality of life. I encouraged her to consider hospice, so she can spend more time at home with her loved ones. We discussed what hospice can offer, and I'll remain to be her M.D. when she is on hospice. She initially was very reluctant, but became more receptive after the discussion. She is going to discuss with her family members. She is agreeable with DO NOT RESUSCITATE and DO NOT INTUBATE  I'll see her back in a few weeks with repeat lab and blood transfusion if needed.  Truitt Merle  01/22/2016

## 2016-01-22 NOTE — Telephone Encounter (Signed)
Patient called.  She saw Selena Lesser NP on 7/18 with lab and flush.  During that visit Dr. Burr Medico came in and said she wanted to see her in 2 weeks (02/03/16) instead of this coming Monday 7/24.  Patient still has appointment for 7/24 and there are no appts. For 02/03/16.  There does not seem to be a POF for appts.on 02/03/16.  Would you please review this and put in a POF if patient needs to change 7/24 appt.to 02/03/16.  Patient call back is [phone].

## 2016-01-22 NOTE — Assessment & Plan Note (Signed)
Albumin remains low and has slightly decreased down to 1.8 today.  Patient continues with chronic bilateral lower extremity edema.  Doppler ultrasound of bilateral lower legs was negative for DVT.  Patient was advised to elevate her legs above the heart whenever she is at rest and try compression stockings patches also encouraged remain as active as possible.  Patient was also encouraged to push protein in her diet is much as possible.

## 2016-01-22 NOTE — Assessment & Plan Note (Signed)
Patient last received chemotherapy in 2016; and received her final radiation treatments in March 2017.  She currently receives Zometa on an every three-month basis.  Her last Zometa infusion was 12/17/2015.  Patient presented to the [facility] today with complaint of nausea, poor oral intake, dehydration, and aggressive left lower quadrant abdominal discomfort.  She is also noted increased edema to her bilateral lower extremities as well.  She denies any recent fevers or chills.  She continues to take Xarelto as directed.  See further notes for details of today's visit.  Due to all of patient's issues-patient will be transported directly to the emergency department for further evaluation and management.  Brief history.  Report were called to the emergency department charge nurse; prior to the patient being transported via wheelchair to the emergency department per the [facility].  Also, reviewed all findings with Dr. Burr Medico; who was in complete agreement with the plan to transfer the patient to the emergency department for further evaluation and management.  Discussion with both the patient and her family regarding option of palliative care and for sure hospice-but patient stated that she was not ready to consider those options.  Also, reviewed CODE STATUS options; and patient confirmed that she wants to remain a full code for the time being.  Patient is scheduled to return for labs, flush, and a visit on 01/26/2016. ________________________________  Update: Patient presented back to the [facility] today for follow-up of her chronic lower extremity edema.  See further notes for details.  Per Dr. Burr Medico- we will cancel all appointments scheduled for 01/26/2016; and instead see the patient for labs, flush, and a visit on 02/03/2016.  Patient is to call in the interim with any new worries or concerns whatsoever.

## 2016-01-22 NOTE — Assessment & Plan Note (Signed)
Patient last received Zometa for hypercalcemia approximately one month ago.  Corrected calcium today was 12.76.  Her hypercalcemia improved with IV fluid rehydration while admitted.  We'll continue to monitor closely.

## 2016-01-23 ENCOUNTER — Telehealth: Payer: Self-pay | Admitting: Hematology

## 2016-01-23 ENCOUNTER — Telehealth: Payer: Self-pay

## 2016-01-23 NOTE — Telephone Encounter (Signed)
spoke w/ pt confirmed 8/7 apt times

## 2016-01-23 NOTE — Telephone Encounter (Signed)
Called pt to follow up with her after her visit on Tuesday.  Pt states that she is feeling much better.  I also wanted to inform her of her schedule change and she stated that someone had already called and confirmed the appt change with her.  Confirmed times again and told pt to call if she needed anything.  Pt verbalized understanding

## 2016-01-26 ENCOUNTER — Ambulatory Visit: Payer: Medicare Other | Admitting: Hematology

## 2016-01-26 ENCOUNTER — Other Ambulatory Visit: Payer: Medicare Other

## 2016-01-28 ENCOUNTER — Telehealth: Payer: Self-pay

## 2016-01-28 NOTE — Telephone Encounter (Signed)
Pt called stating "her magic mouthwash isn't working and she would like something else"  When I called the pt back she states that "her mouth and tongue burn when drinking soda and she would like something that will make that go away".  Told pt that she should avoid acidic drinks and food and that she should be swishing and swallowing the magic mouthwash 4 times a day and to try to drink things that are low in acid like water and milk.  That she can also gargle with salt water and salt water and baking soda.  If she doesn't feel better by Monday to call us back.  Pt verbalized understanding.

## 2016-02-09 ENCOUNTER — Emergency Department (HOSPITAL_COMMUNITY): Payer: Medicare Other

## 2016-02-09 ENCOUNTER — Ambulatory Visit: Payer: Medicare Other | Admitting: Hematology

## 2016-02-09 ENCOUNTER — Other Ambulatory Visit: Payer: Medicare Other

## 2016-02-09 ENCOUNTER — Observation Stay (HOSPITAL_COMMUNITY): Payer: Medicare Other

## 2016-02-09 ENCOUNTER — Encounter (HOSPITAL_COMMUNITY): Payer: Self-pay | Admitting: Emergency Medicine

## 2016-02-09 ENCOUNTER — Inpatient Hospital Stay (HOSPITAL_COMMUNITY)
Admission: EM | Admit: 2016-02-09 | Discharge: 2016-02-15 | DRG: 374 | Disposition: A | Discharge status: Hospice | Payer: Medicare Other | Attending: Internal Medicine | Admitting: Internal Medicine

## 2016-02-09 DIAGNOSIS — D649 Anemia, unspecified: Secondary | ICD-10-CM

## 2016-02-09 DIAGNOSIS — Z7901 Long term (current) use of anticoagulants: Secondary | ICD-10-CM

## 2016-02-09 DIAGNOSIS — Z87891 Personal history of nicotine dependence: Secondary | ICD-10-CM

## 2016-02-09 DIAGNOSIS — R601 Generalized edema: Secondary | ICD-10-CM

## 2016-02-09 DIAGNOSIS — E43 Unspecified severe protein-calorie malnutrition: Secondary | ICD-10-CM | POA: Diagnosis present

## 2016-02-09 DIAGNOSIS — C7931 Secondary malignant neoplasm of brain: Secondary | ICD-10-CM | POA: Diagnosis present

## 2016-02-09 DIAGNOSIS — R1032 Left lower quadrant pain: Secondary | ICD-10-CM | POA: Diagnosis present

## 2016-02-09 DIAGNOSIS — E440 Moderate protein-calorie malnutrition: Secondary | ICD-10-CM | POA: Diagnosis present

## 2016-02-09 DIAGNOSIS — R64 Cachexia: Secondary | ICD-10-CM | POA: Diagnosis present

## 2016-02-09 DIAGNOSIS — Z515 Encounter for palliative care: Secondary | ICD-10-CM | POA: Diagnosis present

## 2016-02-09 DIAGNOSIS — K5669 Other intestinal obstruction: Secondary | ICD-10-CM | POA: Diagnosis present

## 2016-02-09 DIAGNOSIS — C189 Malignant neoplasm of colon, unspecified: Secondary | ICD-10-CM | POA: Diagnosis not present

## 2016-02-09 DIAGNOSIS — Z5111 Encounter for antineoplastic chemotherapy: Secondary | ICD-10-CM

## 2016-02-09 DIAGNOSIS — E46 Unspecified protein-calorie malnutrition: Secondary | ICD-10-CM | POA: Diagnosis present

## 2016-02-09 DIAGNOSIS — D630 Anemia in neoplastic disease: Secondary | ICD-10-CM | POA: Diagnosis not present

## 2016-02-09 DIAGNOSIS — Z923 Personal history of irradiation: Secondary | ICD-10-CM

## 2016-02-09 DIAGNOSIS — C7800 Secondary malignant neoplasm of unspecified lung: Secondary | ICD-10-CM | POA: Diagnosis present

## 2016-02-09 DIAGNOSIS — Z933 Colostomy status: Secondary | ICD-10-CM

## 2016-02-09 DIAGNOSIS — R1084 Generalized abdominal pain: Secondary | ICD-10-CM | POA: Diagnosis not present

## 2016-02-09 DIAGNOSIS — C19 Malignant neoplasm of rectosigmoid junction: Principal | ICD-10-CM | POA: Diagnosis present

## 2016-02-09 DIAGNOSIS — Z9081 Acquired absence of spleen: Secondary | ICD-10-CM

## 2016-02-09 DIAGNOSIS — I2699 Other pulmonary embolism without acute cor pulmonale: Secondary | ICD-10-CM | POA: Diagnosis not present

## 2016-02-09 DIAGNOSIS — Z800 Family history of malignant neoplasm of digestive organs: Secondary | ICD-10-CM

## 2016-02-09 DIAGNOSIS — Z79899 Other long term (current) drug therapy: Secondary | ICD-10-CM

## 2016-02-09 DIAGNOSIS — K56699 Other intestinal obstruction unspecified as to partial versus complete obstruction: Secondary | ICD-10-CM

## 2016-02-09 DIAGNOSIS — Z9049 Acquired absence of other specified parts of digestive tract: Secondary | ICD-10-CM

## 2016-02-09 DIAGNOSIS — Z803 Family history of malignant neoplasm of breast: Secondary | ICD-10-CM

## 2016-02-09 DIAGNOSIS — R197 Diarrhea, unspecified: Secondary | ICD-10-CM

## 2016-02-09 DIAGNOSIS — Z8719 Personal history of other diseases of the digestive system: Secondary | ICD-10-CM

## 2016-02-09 DIAGNOSIS — R0602 Shortness of breath: Secondary | ICD-10-CM

## 2016-02-09 DIAGNOSIS — K922 Gastrointestinal hemorrhage, unspecified: Secondary | ICD-10-CM | POA: Diagnosis present

## 2016-02-09 DIAGNOSIS — Z808 Family history of malignant neoplasm of other organs or systems: Secondary | ICD-10-CM

## 2016-02-09 DIAGNOSIS — R52 Pain, unspecified: Secondary | ICD-10-CM

## 2016-02-09 DIAGNOSIS — Z885 Allergy status to narcotic agent status: Secondary | ICD-10-CM

## 2016-02-09 DIAGNOSIS — C787 Secondary malignant neoplasm of liver and intrahepatic bile duct: Secondary | ICD-10-CM | POA: Diagnosis present

## 2016-02-09 DIAGNOSIS — R600 Localized edema: Secondary | ICD-10-CM | POA: Diagnosis present

## 2016-02-09 DIAGNOSIS — R112 Nausea with vomiting, unspecified: Secondary | ICD-10-CM

## 2016-02-09 DIAGNOSIS — Z86711 Personal history of pulmonary embolism: Secondary | ICD-10-CM

## 2016-02-09 DIAGNOSIS — Z90710 Acquired absence of both cervix and uterus: Secondary | ICD-10-CM

## 2016-02-09 DIAGNOSIS — R188 Other ascites: Secondary | ICD-10-CM

## 2016-02-09 DIAGNOSIS — D72829 Elevated white blood cell count, unspecified: Secondary | ICD-10-CM | POA: Diagnosis present

## 2016-02-09 LAB — CBC WITH DIFFERENTIAL/PLATELET
BASOS PCT: 0 %
Basophils Absolute: 0 10*3/uL (ref 0.0–0.1)
EOS ABS: 0 10*3/uL (ref 0.0–0.7)
Eosinophils Relative: 0 %
HCT: 21.3 % — ABNORMAL LOW (ref 36.0–46.0)
Hemoglobin: 6.8 g/dL — CL (ref 12.0–15.0)
LYMPHS ABS: 1.4 10*3/uL (ref 0.7–4.0)
Lymphocytes Relative: 9 %
MCH: 24.5 pg — AB (ref 26.0–34.0)
MCHC: 31.9 g/dL (ref 30.0–36.0)
MCV: 76.9 fL — AB (ref 78.0–100.0)
MONO ABS: 0.6 10*3/uL (ref 0.1–1.0)
Monocytes Relative: 4 %
NEUTROS ABS: 13 10*3/uL — AB (ref 1.7–7.7)
Neutrophils Relative %: 87 %
PLATELETS: 730 10*3/uL — AB (ref 150–400)
RBC: 2.77 MIL/uL — ABNORMAL LOW (ref 3.87–5.11)
RDW: 23.7 % — AB (ref 11.5–15.5)
WBC: 15 10*3/uL — ABNORMAL HIGH (ref 4.0–10.5)

## 2016-02-09 LAB — FOLATE: FOLATE: 5.1 ng/mL — AB (ref >5.9)

## 2016-02-09 LAB — VITAMIN B12

## 2016-02-09 LAB — COMPREHENSIVE METABOLIC PANEL
ALT: 17 U/L (ref 14–54)
AST: 30 U/L (ref 15–41)
Albumin: 1.9 g/dL — ABNORMAL LOW (ref 3.5–5.0)
Alkaline Phosphatase: 247 U/L — ABNORMAL HIGH (ref 38–126)
Anion gap: 10 (ref 5–15)
BUN: 8 mg/dL (ref 6–20)
CHLORIDE: 101 mmol/L (ref 101–111)
CO2: 22 mmol/L (ref 22–32)
CREATININE: 0.39 mg/dL — AB (ref 0.44–1.00)
Calcium: 9.4 mg/dL (ref 8.9–10.3)
Glucose, Bld: 96 mg/dL (ref 65–99)
Potassium: 4 mmol/L (ref 3.5–5.1)
Sodium: 133 mmol/L — ABNORMAL LOW (ref 135–145)
Total Bilirubin: 0.8 mg/dL (ref 0.3–1.2)
Total Protein: 6.3 g/dL — ABNORMAL LOW (ref 6.5–8.1)

## 2016-02-09 LAB — URINALYSIS, ROUTINE W REFLEX MICROSCOPIC
Bilirubin Urine: NEGATIVE
Glucose, UA: NEGATIVE mg/dL
HGB URINE DIPSTICK: NEGATIVE
Ketones, ur: NEGATIVE mg/dL
LEUKOCYTES UA: NEGATIVE
NITRITE: NEGATIVE
PROTEIN: NEGATIVE mg/dL
pH: 6.5 (ref 5.0–8.0)

## 2016-02-09 LAB — PROTIME-INR
INR: 2.14
PROTHROMBIN TIME: 24.2 s — AB (ref 11.4–15.2)

## 2016-02-09 LAB — IRON AND TIBC
IRON: 6 ug/dL — AB (ref 28–170)
Saturation Ratios: 4 % — ABNORMAL LOW (ref 10.4–31.8)
TIBC: 144 ug/dL — ABNORMAL LOW (ref 250–450)
UIBC: 138 ug/dL

## 2016-02-09 LAB — HEMOGLOBIN AND HEMATOCRIT, BLOOD
HEMATOCRIT: 25.9 % — AB (ref 36.0–46.0)
HEMOGLOBIN: 8.5 g/dL — AB (ref 12.0–15.0)

## 2016-02-09 LAB — LIPASE, BLOOD: LIPASE: 71 U/L — AB (ref 11–51)

## 2016-02-09 LAB — RETICULOCYTES
RBC.: 2.58 MIL/uL — ABNORMAL LOW (ref 3.87–5.11)
Retic Count, Absolute: 41.3 10*3/uL (ref 19.0–186.0)
Retic Ct Pct: 1.6 % (ref 0.4–3.1)

## 2016-02-09 LAB — FERRITIN: FERRITIN: 179 ng/mL (ref 11–307)

## 2016-02-09 MED ORDER — SODIUM CHLORIDE 0.9 % IV SOLN
Freq: Once | INTRAVENOUS | Status: AC
  Administered 2016-02-09: 11:00:00 via INTRAVENOUS

## 2016-02-09 MED ORDER — VITAMINS A & D EX OINT
TOPICAL_OINTMENT | CUTANEOUS | Status: AC
  Administered 2016-02-09: 5
  Filled 2016-02-09: qty 5

## 2016-02-09 MED ORDER — MORPHINE SULFATE (PF) 2 MG/ML IV SOLN
2.0000 mg | INTRAVENOUS | Status: DC | PRN
  Administered 2016-02-09: 2 mg via INTRAVENOUS
  Filled 2016-02-09: qty 1

## 2016-02-09 MED ORDER — SODIUM CHLORIDE 0.9% FLUSH
3.0000 mL | Freq: Two times a day (BID) | INTRAVENOUS | Status: DC
  Administered 2016-02-09 – 2016-02-14 (×3): 3 mL via INTRAVENOUS

## 2016-02-09 MED ORDER — HYDROMORPHONE HCL 1 MG/ML IJ SOLN
1.0000 mg | Freq: Once | INTRAMUSCULAR | Status: AC
  Administered 2016-02-09: 1 mg via INTRAVENOUS
  Filled 2016-02-09: qty 1

## 2016-02-09 MED ORDER — LIDOCAINE-PRILOCAINE 2.5-2.5 % EX CREA
TOPICAL_CREAM | Freq: Once | CUTANEOUS | Status: AC
  Administered 2016-02-09: 11:00:00 via TOPICAL
  Filled 2016-02-09: qty 5

## 2016-02-09 MED ORDER — ONDANSETRON HCL 4 MG PO TABS: 4.0000 mg | ORAL_TABLET | Freq: Four times a day (QID) | ORAL | Status: DC | PRN

## 2016-02-09 MED ORDER — IRON 325 (65 FE) MG PO TABS: ORAL_TABLET | Freq: Every morning | ORAL | Status: DC

## 2016-02-09 MED ORDER — FERROUS SULFATE 325 (65 FE) MG PO TABS
325.0000 mg | ORAL_TABLET | Freq: Every day | ORAL | Status: DC
  Administered 2016-02-09 – 2016-02-15 (×6): 325 mg via ORAL
  Filled 2016-02-09 (×7): qty 1

## 2016-02-09 MED ORDER — SODIUM CHLORIDE 0.9% FLUSH
10.0000 mL | Freq: Two times a day (BID) | INTRAVENOUS | Status: DC
  Administered 2016-02-10: 20 mL
  Administered 2016-02-14: 10 mL

## 2016-02-09 MED ORDER — DIPHENHYDRAMINE HCL 50 MG/ML IJ SOLN: 25.0000 mg | Freq: Four times a day (QID) | INTRAMUSCULAR | Status: DC | PRN

## 2016-02-09 MED ORDER — ONDANSETRON HCL 4 MG/2ML IJ SOLN: 4.0000 mg | Freq: Four times a day (QID) | INTRAMUSCULAR | Status: DC | PRN

## 2016-02-09 MED ORDER — SODIUM CHLORIDE 0.9% FLUSH
10.0000 mL | INTRAVENOUS | Status: DC | PRN
  Administered 2016-02-10 – 2016-02-14 (×3): 10 mL
  Filled 2016-02-09 (×3): qty 40

## 2016-02-09 MED ORDER — FUROSEMIDE 10 MG/ML IJ SOLN
20.0000 mg | Freq: Once | INTRAMUSCULAR | Status: AC
Start: 2016-02-09 — End: 2016-02-09
  Administered 2016-02-09: 20 mg via INTRAVENOUS
  Filled 2016-02-09: qty 2

## 2016-02-09 MED ORDER — SODIUM CHLORIDE 0.9 % IV BOLUS (SEPSIS)
1000.0000 mL | Freq: Once | INTRAVENOUS | Status: AC
  Administered 2016-02-09: 1000 mL via INTRAVENOUS

## 2016-02-09 MED ORDER — ONDANSETRON HCL 4 MG/2ML IJ SOLN
4.0000 mg | Freq: Once | INTRAMUSCULAR | Status: DC
  Filled 2016-02-09: qty 2

## 2016-02-09 MED ORDER — PANTOPRAZOLE SODIUM 40 MG PO TBEC
40.0000 mg | DELAYED_RELEASE_TABLET | Freq: Every day | ORAL | Status: DC
  Administered 2016-02-09 – 2016-02-15 (×6): 40 mg via ORAL
  Filled 2016-02-09 (×6): qty 1

## 2016-02-09 MED ORDER — DIATRIZOATE MEGLUMINE & SODIUM 66-10 % PO SOLN
15.0000 mL | Freq: Once | ORAL | Status: AC
  Administered 2016-02-09: 15 mL via ORAL

## 2016-02-09 MED ORDER — HYDROMORPHONE HCL 1 MG/ML IJ SOLN
1.0000 mg | INTRAMUSCULAR | Status: DC | PRN
  Administered 2016-02-09 – 2016-02-13 (×20): 1 mg via INTRAVENOUS
  Filled 2016-02-09 (×22): qty 1

## 2016-02-09 MED ORDER — VITAMIN B-12 500 MCG PO TABS
500.0000 ug | ORAL_TABLET | Freq: Every day | ORAL | Status: DC
  Administered 2016-02-09 – 2016-02-15 (×6): 500 ug via ORAL
  Filled 2016-02-09 (×7): qty 1

## 2016-02-09 MED ORDER — SODIUM CHLORIDE 0.9% FLUSH: 10.0000 mL | INTRAVENOUS | Status: DC | PRN

## 2016-02-09 MED ORDER — SODIUM CHLORIDE 0.9 % IV SOLN
INTRAVENOUS | Status: AC
  Administered 2016-02-09: 17:00:00 via INTRAVENOUS

## 2016-02-09 MED ORDER — IOPAMIDOL (ISOVUE-300) INJECTION 61%
100.0000 mL | Freq: Once | INTRAVENOUS | Status: AC | PRN
  Administered 2016-02-09: 100 mL via INTRAVENOUS

## 2016-02-09 MED ORDER — HYDROMORPHONE HCL 2 MG PO TABS
4.0000 mg | ORAL_TABLET | ORAL | Status: DC | PRN
  Administered 2016-02-09 – 2016-02-15 (×6): 4 mg via ORAL
  Filled 2016-02-09 (×10): qty 2

## 2016-02-09 NOTE — ED Provider Notes (Signed)
[facility] DEPT Provider Note   CSN: [id number] Arrival date & time: 02/09/16  0448  First Provider Contact:  None       History   Chief Complaint Chief Complaint  Patient presents with  . Abdominal Pain    HPI [name] [name] is a 65 y.o. female with a past medical history of colon cancer status post resection one year ago, presenting today with worsening epigastric and left upper quadrant abdominal pain. Patient states this has been going on for 1 week and slowly worsening. She has had associated nausea and vomiting. She also states she's had diarrhea. She feels like she has distention and swelling of her abdomen. She denies any fevers or problems with urination. She has no vaginal complaints. She takes Dilaudid at home for pain control and this has mildly helped. Nothing makes the symptoms worse. EMS gave her 4 mg of Zofran in route to the emergency department. There are no further complaints. She denies any history of SBO.  10 Systems reviewed and are negative for acute change except as noted in the HPI.    HPI  Past Medical History:  Diagnosis Date  . Anemia    hx of   . Colon cancer ([place])    dx'd 2014  . Heart murmur    ? as a child   . Pulmonary embolism ([place]) 11/2014   . S/P radiation therapy 09/18/15   SRS left frontoparietal 20Gy    Patient Active Problem List   Diagnosis Date Noted  . Nausea without vomiting 01/16/2016  . Dehydration 01/16/2016  . Hypoalbuminemia due to protein-calorie malnutrition ([place]) 01/16/2016  . Abdominal pain, acute, left lower quadrant 01/16/2016  . Peripheral edema 01/16/2016  . Symptomatic anemia 01/16/2016  . Severe anemia 01/16/2016  . Abdominal pain 11/25/2015  . 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 [facility])   . Port catheter in place 11/14/2015  . Pressure ulcer 10/26/2015  . Right sided weakness 10/25/2015  . Protein-calorie malnutrition, moderate ([place]) 10/25/2015  . Hyponatremia   . Hypercalcemia of malignancy   .  Absolute anemia   . Recurrent aphthous stomatitis   . Benign essential HTN   . Abnormality of gait   . Hemiparesis ([place])   . Acute septic pulmonary embolism without acute cor pulmonale ([facility])   . Anemia of chronic disease   . Chronic pulmonary embolism ([place])   . Ataxia   . Metastatic colon cancer to liver ([place])   . Brain metastases ([place]) 09/13/2015  . Right leg weakness 09/13/2015  . Long-term (current) use of anticoagulants 03/03/2015  . 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 Pulmonary embolism ([place]) 11/14/2014  . 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 Anxiety   . Upper abdominal pain   . 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 Colorectal cancer, stage IV, obstructing s/p stent x2, ostomy, then LAR 03/06/2015 06/05/2013  . Family history of colorectal cancer 06/05/2013  . Tachycardia 06/05/2013    Past Surgical History:  Procedure Laterality Date  . ABDOMINAL HYSTERECTOMY  2005  . BOWEL RESECTION  03/06/2015   Procedure: LOW ANTERIOR BOWEL RESECTION, rigid proctoscopy;  Surgeon: Michael Boston, MD;  Location: [facility];  Service: General;;  . [place]  . COLON RESECTION N/A 10/28/2014   Procedure: Transverse loop colostomy;  Surgeon: [name], MD;  Location: [facility];  Service: General;  Laterality: N/A;  . COLON RESECTION N/A 03/06/2015   Procedure: LAPAROSCOPIC takedown loop colostomy ;  Surgeon: Michael Boston, MD;  Location: [facility];  Service: General;  Laterality: N/A;  . COLONIC STENT PLACEMENT N/A 05/25/2013   Procedure: COLONIC STENT PLACEMENT;  Surgeon: [name], MD;  Location: [facility];  Service: Endoscopy;  Laterality: N/A;  . COLONIC STENT PLACEMENT N/A 08/19/2014   Procedure: COLONIC STENT PLACEMENT;  Surgeon: Gatha Mayer, MD;  Location: [facility];  Service: Endoscopy;  Laterality: N/A;  . COLONIC STENT PLACEMENT N/A 08/19/2014   Procedure: COLONIC STENT PLACEMENT;  Surgeon: Gatha Mayer, MD;  [facility];  Service: Endoscopy;   Laterality: N/A;  . COLOSTOMY REVISION N/A 01/03/2015   Procedure: REVISION OF TRANSVERSE LOOP COLOSTOMY WITH PARTIAL  COLECTOMY;  Surgeon: [name], MD;  [facility]: [facility];  Service: General;  Laterality: N/A;  . FLEXIBLE SIGMOIDOSCOPY N/A 05/25/2013   Procedure: Beryle Quant;  Surgeon: [name], MD;  Location: [facility];  Service: Endoscopy;  Laterality: N/A;  needs floro  . FLEXIBLE SIGMOIDOSCOPY N/A 08/19/2014   Procedure: FLEXIBLE SIGMOIDOSCOPY;  Surgeon: Gatha Mayer, MD;  [facility];  Service: Endoscopy;  Laterality: N/A;  . FLEXIBLE SIGMOIDOSCOPY N/A 08/19/2014   Procedure: FLEXIBLE SIGMOIDOSCOPY;  Surgeon: Gatha Mayer, MD;  [facility];  Service: Endoscopy;  Laterality: N/A;  . LAPAROSCOPIC LOW ANTERIOR RESECTION  03/06/2015   03/06/2015  . LAPAROSCOPIC SPLENECTOMY  03/06/2015  . POLYPECTOMY  03/06/2015   Procedure: transverse POLYPECTOMY x 4;  Surgeon: Michael Boston, MD;  [facility];  Service: General;;  . PORTACATH PLACEMENT Right 06/25/2013   Procedure: ULTRA SOUND GUIDED INSERTION PORT-A-CATH;  Surgeon: [name], MD;  Location: [facility];  Service: General;  Laterality: Right;  . SPLENECTOMY, TOTAL N/A 03/06/2015   Procedure: SPLENECTOMY;  Surgeon: Michael Boston, MD;  [facility];  Service: General;  Laterality: N/A;    OB History    No data available       Home Medications    Prior to Admission medications   Medication Sig Start Date End Date Taking? Authorizing Provider  acetaminophen (TYLENOL) 500 MG tablet Take 500 mg by mouth every 6 (six) hours as needed for moderate pain or headache.    Yes Historical Provider, MD  HYDROmorphone (DILAUDID) 4 MG tablet Take 1 tablet (4 mg total) by mouth every 4 (four) hours as needed for severe pain. 01/05/16  Yes Hayden Pedro, PA-C  IRON PO Take 1 tablet by mouth every morning.   Yes Historical Provider, MD  polyethylene glycol powder  (GLYCOLAX/MIRALAX) powder TAKE 34 G (2 DOSES) BY MOUTH DAILY AS NEEDED FOR MILD CONSTIPATION OR MODERATE CONSTIPATION 03/03/14  Yes Concha Norway, MD  vitamin B-12 (CYANOCOBALAMIN) 500 MCG tablet Take 500 mcg by mouth daily.   Yes Historical Provider, MD  XARELTO 20 MG TABS tablet TAKE 1 TABLET BY MOUTH EVERY DAY WITH SUPPER 01/14/16  Yes Truitt Merle, MD  dexamethasone (DECADRON) 2 MG tablet Take 2mg  BID for 1 week, then 2 mg daily for 1 week, then 2mg  every other day for one week then stop Patient not taking: Reported on 02/09/2016 12/17/15   Kyung Rudd, MD  fluconazole (DIFLUCAN) 100 MG tablet Take 1 tablet (100 mg total) by mouth daily. Patient not taking: Reported on 02/09/2016 01/20/16   Susanne Borders, NP  magic mouthwash w/lidocaine SOLN Take 5 mLs by mouth 4 (four) times daily as needed for mouth pain. Patient not taking: Reported on 02/09/2016 01/20/16   Susanne Borders, NP  pantoprazole (PROTONIX) 40 MG tablet  Take 1 tablet (40 mg total) by mouth daily. Patient not taking: Reported on 01/20/2016 12/02/15   Lavon Paganini Angiulli, PA-C    Family History Family History  Problem Relation Age of Onset  . Colon cancer Sister 81  . Breast cancer Maternal Aunt   . Prostate cancer Maternal Uncle   . Bone cancer Maternal Grandfather   . Prostate cancer Maternal Uncle   . Diabetes Neg Hx   . Thyroid disease Neg Hx     Social History Social History  Substance Use Topics  . Smoking status: Former Smoker    Years: 1.00    Types: Cigarettes    Quit date: 05/24/1971  . Smokeless tobacco: Never Used  . Alcohol use No     Allergies   Oxycontin [oxycodone hcl] and Codeine   Review of Systems Review of Systems   Physical Exam Updated Vital Signs BP 121/66   Pulse 109   Temp 98.3 F (36.8 C)   Resp 18   SpO2 99%   Physical Exam  Constitutional: She is oriented to person, place, and time. She appears well-developed and well-nourished. No distress.  HENT:  Head: Normocephalic and atraumatic.    Nose: Nose normal.  Mouth/Throat: Oropharynx is clear and moist. No oropharyngeal exudate.  Eyes: Conjunctivae and EOM are normal. Pupils are equal, round, and reactive to light. No scleral icterus.  Neck: Normal range of motion. Neck supple. No JVD present. No tracheal deviation present. No thyromegaly present.  Cardiovascular: Regular rhythm and normal heart sounds.  Exam reveals no gallop and no friction rub.   No murmur heard. Tachycardic  Pulmonary/Chest: Effort normal and breath sounds normal. No respiratory distress. She has no wheezes. She exhibits no tenderness.  Abdominal: Soft. Bowel sounds are normal. She exhibits distension. She exhibits no mass. There is tenderness. There is no rebound and no guarding.  Tenderness to palpation in the midepigastric and left upper quadrant. There is abdominal distention.  Genitourinary: Rectal exam shows guaiac positive stool.  Musculoskeletal: Normal range of motion. She exhibits edema. She exhibits no tenderness.  Left lower extremity 2+ edema.  Lymphadenopathy:    She has no cervical adenopathy.  Neurological: She is alert and oriented to person, place, and time. No cranial nerve deficit. She exhibits normal muscle tone.  Skin: Skin is warm and dry. No rash noted. No erythema. No pallor.  Nursing note and vitals reviewed.    ED Treatments / Results  Labs (all labs ordered are listed, but only abnormal results are displayed) Labs Reviewed  CBC WITH DIFFERENTIAL/PLATELET - Abnormal; Notable for the following:       Result Value   WBC 15.0 (*)    RBC 2.77 (*)    Hemoglobin 6.8 (*)    HCT 21.3 (*)    MCV 76.9 (*)    MCH 24.5 (*)    RDW 23.7 (*)    Platelets 730 (*)    Neutro Abs 13.0 (*)    All other components within normal limits  COMPREHENSIVE METABOLIC PANEL - Abnormal; Notable for the following:    Sodium 133 (*)    Creatinine, Ser 0.39 (*)    Total Protein 6.3 (*)    Albumin 1.9 (*)    Alkaline Phosphatase 247 (*)    All  other components within normal limits  LIPASE, BLOOD - Abnormal; Notable for the following:    Lipase 71 (*)    All other components within normal limits  PROTIME-INR - Abnormal; Notable for the  following:    Prothrombin Time 24.2 (*)    All other components within normal limits  TYPE AND SCREEN    EKG  EKG Interpretation None       Radiology No results found.  Procedures Procedures (including critical care time)  Medications Ordered in ED Medications  sodium chloride 0.9 % bolus 1,000 mL (0 mLs Intravenous Stopped 02/09/16 0603)  HYDROmorphone (DILAUDID) injection 1 mg (1 mg Intravenous Given 02/09/16 0512)  diatrizoate meglumine-sodium (GASTROGRAFIN) 66-10 % solution 15 mL (15 mLs Oral Given by Other 02/09/16 [id number])     Initial Impression / Assessment and Plan / ED Course  I have reviewed the triage vital signs and the nursing notes.  Pertinent labs & imaging results that were available during my care of the patient were reviewed by me and considered in my medical decision making (see chart for details).  Clinical Course    Patient presents to the emergency department for abdominal pain. She has a high-risk history with colon cancer. Will perform CT scan of the abdomen for further evaluation. She is also expecting focal tenderness. Hemoglobin is 6.8, this is down from her last lab draw 3 weeks ago 7.9. Hemoccult was positive with brown stool. Patient will require admission for further evaluation of her anemia.  Final Clinical Impressions(s) / ED Diagnoses   Final diagnoses:  None    New Prescriptions New Prescriptions   No medications on file     Everlene Balls, MD 02/09/16 [phone]

## 2016-02-09 NOTE — Care Management Obs Status (Signed)
[facility] NOTIFICATION   Patient Details  Name: [name] [name] MRN: [id number] Date of Birth: Nov 29, 1950   Medicare Observation Status Notification Given:  Yes    MahabirJuliann Pulse, RN 02/09/2016, 10:12 AM

## 2016-02-09 NOTE — ED Triage Notes (Signed)
Brought in by EMS from home with c/o abdominal pain.  Per EMS, pt reported that she has been having diffused abdominal pain with N/V/D and "distention".   Pt has hx of Colon CA.  Was given Zofran 4 mg IV en route to ED.

## 2016-02-09 NOTE — Consult Note (Signed)
Reason for Consult: Bowel obstruction Referring Physician: Dr. Lala Lund  Do NOT DISCUSS CONDITION OR OPTIONS IN FRONT OF ANY OTHER PERSONS. SHE DOES NOT WANT ANYONE IN THE ROOM WHEN YOU DISCUSS HER CONDITION   [name] [name] is an 65 y.o. female.  HPI: Patient's a stage IV colon cancer patient of Dr. Burr Medico, she has metastasis to her brain liver and lungs. She is on Xarelto for pulmonary embolism, has symptomatic anemia, and is refusing chemotherapy. She presents with a 2-3 day history of constipation, Saturday 02/07/16 she developed nausea and vomiting. Yesterday she had some loose stool come through, but no normal bowel movement. She is taking in some oral nutrition, but not very much. She is lost 9 pounds since 01/22/16, her last office visit that I can see. At that time she was transported to the emergency department. Discussions at that time regarding palliative care and hospice care. Patient stated she was not ready to consider those options and remain a full code at that time.  Workup in the emergency room shows she is afebrile but somewhat tachycardic. Blood pressure is stable. Sodium is 133 alkaline phosphatase is elevated lipase is slightly elevated WBC is 15,000. Hemoglobin/hematocrit 6.8/21.3. Platelet count was 730,000.  Chest x-ray on admission shows a possible metastatic right upper lobe pulmonary nodule which is new since April. CT scan shows she is post left hemicolectomy with a possible malignant obstruction involving distal colonic anastomosis windows within the wall thickening involving the upstream colon. No evidence of perforation. There is progression of the hepatic metastatic disease even compared with the study on 01/16/16. Reference mass within the right liver lobe now measures 5.5 cm was previously 3.8 cm. Grossly unchanged pulmonary metastatic disease.  Past Medical History:  Diagnosis Date  . Anemia    hx of   . Colon cancer ([place])    dx'd 2014  . Heart murmur    ?  as a child   . Pulmonary embolism ([place]) 11/2014   . S/P radiation therapy 09/18/15   SRS left frontoparietal 20Gy    Past Surgical History:  Procedure Laterality Date  . ABDOMINAL HYSTERECTOMY  2005  . BOWEL RESECTION  03/06/2015   Procedure: LOW ANTERIOR BOWEL RESECTION, rigid proctoscopy;  Surgeon: Michael Boston, MD;  Location: [facility];  Service: General;;  . [place]  . COLON RESECTION N/A 10/28/2014   Procedure: Transverse loop colostomy;  Surgeon: [name], MD;  Location: [facility];  Service: General;  Laterality: N/A;  . COLON RESECTION N/A 03/06/2015   Procedure: LAPAROSCOPIC takedown loop colostomy ;  Surgeon: Michael Boston, MD;  Location: [facility];  Service: General;  Laterality: N/A;  . COLONIC STENT PLACEMENT N/A 05/25/2013   Procedure: COLONIC STENT PLACEMENT;  Surgeon: [name], MD;  Location: [facility];  Service: Endoscopy;  Laterality: N/A;  . COLONIC STENT PLACEMENT N/A 08/19/2014   Procedure: COLONIC STENT PLACEMENT;  Surgeon: Gatha Mayer, MD;  Location: [facility];  Service: Endoscopy;  Laterality: N/A;  . COLONIC STENT PLACEMENT N/A 08/19/2014   Procedure: COLONIC STENT PLACEMENT;  Surgeon: Gatha Mayer, MD;  Location: [facility];  Service: Endoscopy;  Laterality: N/A;  . COLOSTOMY REVISION N/A 01/03/2015   Procedure: REVISION OF TRANSVERSE LOOP COLOSTOMY WITH PARTIAL  COLECTOMY;  Surgeon: [name], MD;  Location: [facility];  Service: General;  Laterality: N/A;  . FLEXIBLE SIGMOIDOSCOPY N/A 05/25/2013   Procedure: Beryle Quant;  Surgeon: [name], MD;  [facility]: [facility];  Service: Endoscopy;  Laterality: N/A;  needs floro  . FLEXIBLE SIGMOIDOSCOPY N/A 08/19/2014   Procedure: FLEXIBLE SIGMOIDOSCOPY;  Surgeon: Gatha Mayer, MD;  [facility];  Service: Endoscopy;  Laterality: N/A;  . FLEXIBLE SIGMOIDOSCOPY N/A 08/19/2014   Procedure: FLEXIBLE SIGMOIDOSCOPY;  Surgeon: Gatha Mayer, MD;  [facility];   Service: Endoscopy;  Laterality: N/A;  . LAPAROSCOPIC LOW ANTERIOR RESECTION  03/06/2015   03/06/2015  . LAPAROSCOPIC SPLENECTOMY  03/06/2015  . POLYPECTOMY  03/06/2015   Procedure: transverse POLYPECTOMY x 4;  Surgeon: Michael Boston, MD;  Location: [facility];  Service: General;;  . PORTACATH PLACEMENT Right 06/25/2013   Procedure: ULTRA SOUND GUIDED INSERTION PORT-A-CATH;  Surgeon: [name], MD;  Location: [facility];  Service: General;  Laterality: Right;  . SPLENECTOMY, TOTAL N/A 03/06/2015   Procedure: SPLENECTOMY;  Surgeon: Michael Boston, MD;  Location: [facility];  Service: General;  Laterality: N/A;    Family History  Problem Relation Age of Onset  . Colon cancer Sister 84  . Breast cancer Maternal Aunt   . Prostate cancer Maternal Uncle   . Bone cancer Maternal Grandfather   . Prostate cancer Maternal Uncle   . Diabetes Neg Hx   . Thyroid disease Neg Hx     Social History:  reports that she quit smoking about 44 years ago. Her smoking use included Cigarettes. She quit after 1.00 year of use. She has never used smokeless tobacco. She reports that she does not drink alcohol or use drugs.  Allergies:  Allergies  Allergen Reactions  . Oxycontin [Oxycodone Hcl] Anaphylaxis, Hives and Itching  . Codeine Itching and Nausea And Vomiting    Prior to Admission medications   Medication Sig Start Date End Date Taking? Authorizing Provider  acetaminophen (TYLENOL) 500 MG tablet Take 500 mg by mouth every 6 (six) hours as needed for moderate pain or headache.    Yes Historical Provider, MD  HYDROmorphone (DILAUDID) 4 MG tablet Take 1 tablet (4 mg total) by mouth every 4 (four) hours as needed for severe pain. 01/05/16  Yes Hayden Pedro, PA-C  IRON PO Take 1 tablet by mouth every morning.   Yes Historical Provider, MD  polyethylene glycol powder (GLYCOLAX/MIRALAX) powder TAKE 34 G (2 DOSES) BY MOUTH DAILY AS NEEDED FOR MILD CONSTIPATION OR MODERATE CONSTIPATION 03/03/14  Yes  Concha Norway, MD  vitamin B-12 (CYANOCOBALAMIN) 500 MCG tablet Take 500 mcg by mouth daily.   Yes Historical Provider, MD  XARELTO 20 MG TABS tablet TAKE 1 TABLET BY MOUTH EVERY DAY WITH SUPPER 01/14/16  Yes Truitt Merle, MD  dexamethasone (DECADRON) 2 MG tablet Take 55m BID for 1 week, then 2 mg daily for 1 week, then 264mevery other day for one week then stop Patient not taking: Reported on 02/09/2016 12/17/15   JoKyung RuddMD  fluconazole (DIFLUCAN) 100 MG tablet Take 1 tablet (100 mg total) by mouth daily. Patient not taking: Reported on 02/09/2016 01/20/16   CySusanne BordersNP  magic mouthwash w/lidocaine SOLN Take 5 mLs by mouth 4 (four) times daily as needed for mouth pain. Patient not taking: Reported on 02/09/2016 01/20/16   CySusanne BordersNP  pantoprazole (PROTONIX) 40 MG tablet Take 1 tablet (40 mg total) by mouth daily. Patient not taking: Reported on 01/20/2016 12/02/15   DaLavon Paganiningiulli, PA-C   \  Results for orders placed or performed during the hospital encounter of 02/09/16 (from the past 48 hour(s))  CBC with Differential/Platelet     Status: Abnormal   Collection Time: 02/09/16  5:06 AM  Result Value Ref Range   WBC 15.0 (H) 4.0 - 10.5 K/uL   RBC 2.77 (L) 3.87 - 5.11 MIL/uL   Hemoglobin 6.8 (LL) 12.0 - 15.0 g/dL    Comment: REPEATED TO VERIFY CRITICAL RESULT CALLED TO, READ BACK BY AND VERIFIED WITH: Les Pou RN 1219 02/09/16 A NAVARRO    HCT 21.3 (L) 36.0 - 46.0 %   MCV 76.9 (L) 78.0 - 100.0 fL   MCH 24.5 (L) 26.0 - 34.0 pg   MCHC 31.9 30.0 - 36.0 g/dL   RDW 23.7 (H) 11.5 - 15.5 %   Platelets 730 (H) 150 - 400 K/uL   Neutrophils Relative % 87 %   Lymphocytes Relative 9 %   Monocytes Relative 4 %   Eosinophils Relative 0 %   Basophils Relative 0 %   Neutro Abs 13.0 (H) 1.7 - 7.7 K/uL   Lymphs Abs 1.4 0.7 - 4.0 K/uL   Monocytes Absolute 0.6 0.1 - 1.0 K/uL   Eosinophils Absolute 0.0 0.0 - 0.7 K/uL   Basophils Absolute 0.0 0.0 - 0.1 K/uL   RBC Morphology TARGET CELLS      Comment: POLYCHROMASIA PRESENT   Smear Review LARGE PLATELETS PRESENT   Comprehensive metabolic panel     Status: Abnormal   Collection Time: 02/09/16  5:06 AM  Result Value Ref Range   Sodium 133 (L) 135 - 145 mmol/L   Potassium 4.0 3.5 - 5.1 mmol/L   Chloride 101 101 - 111 mmol/L   CO2 22 22 - 32 mmol/L   Glucose, Bld 96 65 - 99 mg/dL   BUN 8 6 - 20 mg/dL   Creatinine, Ser 0.39 (L) 0.44 - 1.00 mg/dL   Calcium 9.4 8.9 - 10.3 mg/dL   Total Protein 6.3 (L) 6.5 - 8.1 g/dL   Albumin 1.9 (L) 3.5 - 5.0 g/dL   AST 30 15 - 41 U/L   ALT 17 14 - 54 U/L   Alkaline Phosphatase 247 (H) 38 - 126 U/L   Total Bilirubin 0.8 0.3 - 1.2 mg/dL   GFR calc non Af Amer >60 >60 mL/min   GFR calc Af Amer >60 >60 mL/min    Comment: (NOTE) The eGFR has been calculated using the CKD EPI equation. This calculation has not been validated in all clinical situations. eGFR's persistently <60 mL/min signify possible Chronic Kidney Disease.    Anion gap 10 5 - 15  Lipase, blood     Status: Abnormal   Collection Time: 02/09/16  5:06 AM  Result Value Ref Range   Lipase 71 (H) 11 - 51 U/L  Protime-INR     Status: Abnormal   Collection Time: 02/09/16  5:06 AM  Result Value Ref Range   Prothrombin Time 24.2 (H) 11.4 - 15.2 seconds   INR 2.14   Type and screen     Status: None (Preliminary result)   Collection Time: 02/09/16  5:40 AM  Result Value Ref Range   ABO/RH(D) AB POS    Antibody Screen NEG    Sample Expiration 02/12/2016    Unit Number [id number]    Blood Component Type RED CELLS,LR    Unit division 00    Status of Unit ISSUED    Transfusion Status OK TO TRANSFUSE    Crossmatch Result Compatible    Unit Number [id number]    Blood Component Type RED CELLS,LR  Unit division 00    Status of Unit ALLOCATED    Transfusion Status OK TO TRANSFUSE    Crossmatch Result Compatible   Reticulocytes     Status: Abnormal   Collection Time: 02/09/16  8:02 AM  Result Value Ref Range   Retic Ct Pct  1.6 0.4 - 3.1 %   RBC. 2.58 (L) 3.87 - 5.11 MIL/uL   Retic Count, Manual 41.3 19.0 - 186.0 K/uL    Ct Abdomen Pelvis W Contrast  Result Date: 02/09/2016 CLINICAL DATA:  Diffuse abdominal pain. Abdominal distention. History of colon cancer. Elevated lipase. EXAM: CT ABDOMEN AND PELVIS WITH CONTRAST TECHNIQUE: Multidetector CT imaging of the abdomen and pelvis was performed using the standard protocol following bolus administration of intravenous contrast. CONTRAST:  140m ISOVUE-300 IOPAMIDOL (ISOVUE-300) INJECTION 61% COMPARISON:  CT abdomen pelvis - 01/16/2016 FINDINGS: Lower chest: Re- demonstrated bilateral pulmonary nodules, the largest of which within the left lower lobe measures 0.9 cm in diameter (image 42, series 4). Minimal subsegmental atelectasis within the bilateral lower lobes. No pleural effusion. Normal heart size.  No pericardial effusion. Hepatobiliary: Normal hepatic contour. Re- demonstrated innumerable hypo attenuating hepatic metastases, progressed in short interval since the 01/2013 examination with dominant mass within subcapsular aspect the posterior segment of the right lobe of the liver now measuring approximately 5.7 x 4.7 cm (image 22, series 2), previously, 4.4 x 3.5 cm, dominant mass within the posterior subcapsular aspect of the lateral segment of the left lobe of the liver now measuring approximately 4.5 x 4.6 cm (image 34, series 2), previously, 3.6 x 3.4 cm and dominant mass within the central aspect of the right lobe of liver now measuring 5.5 x 4.5 cm (image 20, series 2), previously, 3.8 x 3.2 cm. There is a small amounts of intra-abdominal ascites within the bilateral pericolic gutters. The portal vein remains patent. Normal appearance of the gallbladder given degree distention. No radiopaque gallstones. No intra extrahepatic biliary duct dilatation. Pancreas: Previously questioned peripancreatic stranding is less conspicuous on the present examination. There is  homogeneous enhancement of the pancreatic parenchyma. No definitive pancreatic ductal dilatation. No discrete pancreatic mass on this non pancreatic protocol CT scan. Spleen: Surgically absent. A splenule is noted within the left upper abdominal quadrant. Adrenals/Urinary Tract: There is homogeneous enhancement and excretion of the bilateral kidneys. Punctate (approximately 0.8 cm) nonobstructing stone within the inferior pole of the left kidney as well as an additional punctate (approximately 2 mm) nonobstructing stone within the superior pole the left kidney. No definite right-sided renal stones. Note is again made of mild external rotation of the right kidney. No urinary obstruction or perinephric stranding. Normal appearance of the urinary bladder given degree distention. Stomach/Bowel: Ingested enteric contrast extends to the level of the distal small bowel. Post left hemicolectomy with additional enteric suture line located within the mid upper abdomen. Exophytic soft tissue mass adjacent to the posterior aspect of the surgical anastomosis is grossly unchanged measuring approximately 4.3 x 2.0 cm (coronal image 109, series 5), as is an adjacent exophytic soft tissue mass about the anterior lateral aspect of the anastomosis measuring approximately 2.1 x 3.3 cm (axial image 73, series 2), however there is now apparent malignant obstruction of the distal anastomosis with upstream distention of the colon. There is diffuse wall thickening involving the colon upstream from the anastomosis. No pneumoperitoneum, pneumatosis or portal venous gas. Normal appearance of the terminal ileum and appendix. There is a small amount of fluid seen throughout the abdomen without  definable/drainable fluid collection. Vascular/Lymphatic: Minimal amount of atherosclerotic plaque within a normal caliber abdominal aorta. The major branch vessels of the abdominal aorta appear patent on this non CTA examination. No bulky retroperitoneal,  mesenteric, pelvic or inguinal lymphadenopathy. Reproductive: Post hysterectomy. No discrete adnexal lesion. There is a small amount free fluid in the pelvic cul-de-sac. Other: Well-healed midline abdominal incision. Mild diffuse body wall anasarca, most conspicuous about the lateral aspects of the bilateral thighs, left greater than right. Musculoskeletal: No acute or aggressive osseous abnormalities. An intraosseous hemangioma is noted within the T12 vertebral body. Mild-to-moderate multilevel lumbar spine DDD, worse at L4-L5 with disc space height loss, endplate irregularity and sclerosis. IMPRESSION: 1. Post left hemicolectomy with concern for development of malignant obstruction involving the distal colonic anastomosis with wall thickening involving the upstream colon. No evidence of perforation or definable/drainable fluid collection. 2. Progression of hepatic metastatic disease, even compared with recent prior examination performed 01/16/2016 - reference mass within the right lobe of liver now measures 5.5 cm, previously, 3.8 cm. 3. Decreased conspicuity of previously questioned peripancreatic and duodenal stranding. 4. Grossly unchanged pulmonary metastatic disease within the imaged lung bases. Electronically Signed   By: Sandi Mariscal M.D.   On: 02/09/2016 08:06   Dg Chest Port 1 View  Result Date: 02/09/2016 CLINICAL DATA:  65 year old female with shortness of breath on exertion. Initial encounter. Current history of metastatic colon cancer. EXAM: PORTABLE CHEST 1 VIEW COMPARISON:  Chest radiographs 10/25/2015 and earlier. FINDINGS: Portable AP semi upright view at 0935 hours. Stable right chest porta cath. Lower lung volumes with increased elevation of the diaphragm. Associated linear and platelike atelectasis at both lung bases. No pneumothorax pulmonary edema. No pleural effusion evident. Stable cardiac size and mediastinal contours. Suggestion of a new 9 mm right upper lobe lung nodule since April  (arrow). However, this is well-circumscribed for its size and might be artifact. No other acute pulmonary finding. Paucity of bowel gas in the visualized upper abdomen. IMPRESSION: 1. Possible metastatic right upper lobe pulmonary nodule new since April, but might be artifact (such as something external or on the skin surface). Chest CT (IV contrast preferred) would best evaluate further. 2. Otherwise low lung volumes today with lung base atelectasis. Electronically Signed   By: Genevie Ann M.D.   On: 02/09/2016 10:05   Dg Abd Portable 1v  Result Date: 02/09/2016 CLINICAL DATA:  RIGHT lower quadrant abdominal pain, nausea, vomiting and diarrhea, history colon cancer EXAM: PORTABLE ABDOMEN - 1 VIEW COMPARISON:  CT abdomen and pelvis 02/09/2016 FINDINGS: GI contrast within nondistended small bowel loops in the RIGHT mid abdomen. Gaseous distention of transverse colon. Excreted contrast material within renal collecting systems, ureters, and bladder. Dilatation of LEFT renal collecting system and LEFT ureter. Bones unremarkable. IMPRESSION: Gaseous distention of transverse colon without small bowel dilatation. LEFT hydronephrosis and hydroureter. Electronically Signed   By: Lavonia Dana M.D.   On: 02/09/2016 08:41    Review of Systems  Constitutional: Positive for weight loss (9 pounds since July).  HENT: Negative.   Eyes: Negative.   Respiratory: Negative.   Cardiovascular: Negative.   Gastrointestinal: Positive for abdominal pain, constipation, diarrhea, nausea and vomiting.       Note she was constipated for 2-3 days, prior to onset of nausea and vomiting on 02/07/16. She did have a small bowel movement yesterday p.m. She does she's been able to the foot eating very small amounts.  Genitourinary: Negative.   Musculoskeletal: Negative.   Skin: Negative.  Neurological: Negative.   Endo/Heme/Allergies: Bruises/bleeds easily.  Psychiatric/Behavioral:       She is very adamant about not wanting anyone else  in the room when you talk to her about her condition.   Blood pressure (!) 115/59, pulse (!) 102, temperature 97.7 F (36.5 C), temperature source Axillary, resp. rate 20, weight 66 kg (145 lb 8.1 oz), SpO2 100 %. Physical Exam  Constitutional: She is oriented to person, place, and time.  Chronically ill-appearing woman currently in no acute distress.  HENT:  Head: Normocephalic and atraumatic.  Eyes: Right eye exhibits no discharge. Left eye exhibits no discharge. No scleral icterus.  Neck: Normal range of motion. Neck supple. No JVD present. No thyromegaly present.  Cardiovascular: Normal rate, regular rhythm, normal heart sounds and intact distal pulses.   Respiratory: Effort normal and breath sounds normal. No respiratory distress. She has no wheezes. She has no rales. She exhibits no tenderness.  She has a port left upper chest.  GI: She exhibits no distension and no mass. There is tenderness (Mostly over the right side of her abdomen.). There is no rebound and no guarding.  She is not very distended. Early no nausea or vomiting. No regular BM for at least 3 days now. She did has some loose stool earlier yesterday afternoon. She remains tender over the right side of her abdomen. Few bowel sounds.  Musculoskeletal: She exhibits no edema, tenderness or deformity.  Lymphadenopathy:    She has no cervical adenopathy.  Neurological: She is alert and oriented to person, place, and time. No cranial nerve deficit.  Skin: Skin is warm and dry. No rash noted. No erythema. No pallor.  Psychiatric: She has a normal mood and affect. Her behavior is normal. Judgment and thought content normal.    Assessment/Plan: Colon Obstruction with 4.3 x 2 cm and 2.1 x 3.3 cm exophytic soft tissue masses Recurrent stage IV colon cancer Status post left hemicolectomy, status post colostomy reversal Status post splenectomy Regression of hepatic metastatic disease compared with 01/16/16. Unchanged pulmonary  metastatic disease Anemia with transfusion today  Plan: I told the patient we would not insert an NG in unless she has further nausea or vomiting. I'll review the CT scans with Dr. Lucia Gaskins and will make further recommendations after he's had an opportunity to review the studies.  JENNINGS,WILLARD 02/09/2016, 11:06 AM   Agree with above. Her last surgery was by Dr. Johney Maine on 03/26/2015 in which he reversed her colostomy.  She had problems with prolapse of her loop colostomy (done 10/28/2014 by Dr. Zella Richer) and never she liked it.  She now returns with colonic obstruction. CT scan on 02/09/2016 shows what appears to be a malignant obstruction involving her distal colonic anastomosis. [name] [name] has refused further chemo tx. She does not want a colostomy - which is all I have to offer. This area may be stented, but I am not sure how well that would work, and it did not work well last time. She has refused Hospice in the past.  I have spoken with Dr. Burr Medico about the patient. Her options are limited.  Alphonsa Overall, MD, [facility] Pager: [phone] Office phone:  [phone]

## 2016-02-09 NOTE — H&P (Signed)
TRH H&P   Patient Demographics:    [name] [name], is a 65 y.o. female  MRN: [id number]   DOB - [birth year]  Admit Date - 02/09/2016  Outpatient Primary MD for the patient is Kristine Garbe, MD  Outpatient Specialists: Dr Burr Medico   Patient coming from: Home  Chief Complaint  Patient presents with  . Abdominal Pain      HPI:    [name] [name]  is a 65 y.o. 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 Burr Medico and patient has been unrealistic about her disease and refusing chemotherapy so far, history of recurrent anemia, PE on xaralto, recent admission for symptomatic anemia one month ago.  Patient with above history comes to the ER with 2 day history of generalized nonradiating abdominal pain nausea vomiting and no diarrhea, in the ER CT scan suggestive of possible malignant obstruction of her GI tract, she was also found to have reoccurrence of her anemia and weakly Hemoccult positive. I was called to admit the patient. Currently she is relatively symptom-free, I suspect she is downplaying her symptoms, I discussed her case with general surgery and oncology and she is being admitted for bowel obstruction.    Review of systems:    In addition to the HPI above,  No Fever-chills, No Headache, No changes with Vision or hearing, No problems swallowing food or Liquids, No Chest pain, Cough or Shortness of Breath, GI symptoms as above, No Blood in stool or Urine, No dysuria, No new skin rashes or bruises, No new joints pains-aches,  No new weakness, tingling, numbness in any extremity, No recent weight gain or loss, No polyuria,  polydypsia or polyphagia, No significant Mental Stressors.  A full 10 point Review of Systems was done, except as stated above, all other Review of Systems were negative.   With Past History of the following :    Past Medical History:  Diagnosis Date  . Anemia    hx of   . Colon cancer ([place])    dx'd 2014  . Heart murmur    ? as a child   . Pulmonary embolism ([place]) 11/2014   . S/P radiation therapy 09/18/15   SRS left frontoparietal 20Gy      Past Surgical History:  Procedure Laterality Date  . ABDOMINAL HYSTERECTOMY  2005  . BOWEL RESECTION  03/06/2015   Procedure: LOW ANTERIOR BOWEL RESECTION, rigid proctoscopy;  Surgeon: Michael Boston, MD;  Location: [facility];  Service: General;;  . [place]  . COLON RESECTION N/A 10/28/2014   Procedure: Transverse loop colostomy;  Surgeon: [name], MD;  Location: [facility];  Service: General;  Laterality: N/A;  . COLON RESECTION N/A 03/06/2015   Procedure: LAPAROSCOPIC takedown loop colostomy ;  Surgeon: Michael Boston, MD;  Location: [facility];  Service: General;  Laterality: N/A;  . COLONIC STENT PLACEMENT N/A 05/25/2013   Procedure: COLONIC STENT PLACEMENT;  Surgeon: [name], MD;  Location: [facility];  Service: Endoscopy;  Laterality: N/A;  . COLONIC STENT PLACEMENT N/A 08/19/2014   Procedure: COLONIC STENT PLACEMENT;  Surgeon: Gatha Mayer, MD;  Location: [facility];  Service: Endoscopy;  Laterality: N/A;  . COLONIC STENT PLACEMENT N/A 08/19/2014   Procedure: COLONIC STENT PLACEMENT;  Surgeon: Gatha Mayer, MD;  Location: [facility];  Service: Endoscopy;  Laterality: N/A;  . COLOSTOMY REVISION N/A 01/03/2015   Procedure: REVISION OF TRANSVERSE LOOP COLOSTOMY WITH PARTIAL  COLECTOMY;  Surgeon: [name], MD;  Location: [facility];  Service: General;  Laterality: N/A;  . FLEXIBLE SIGMOIDOSCOPY N/A 05/25/2013   Procedure: Beryle Quant;  Surgeon: [name], MD;  Location: [facility];  Service:  Endoscopy;  Laterality: N/A;  needs floro  . FLEXIBLE SIGMOIDOSCOPY N/A 08/19/2014   Procedure: FLEXIBLE SIGMOIDOSCOPY;  Surgeon: Gatha Mayer, MD;  [facility]: [facility];  Service: Endoscopy;  Laterality: N/A;  . FLEXIBLE SIGMOIDOSCOPY N/A 08/19/2014   Procedure: FLEXIBLE SIGMOIDOSCOPY;  Surgeon: Gatha Mayer, MD;  [facility]: [facility];  Service: Endoscopy;  Laterality: N/A;  . LAPAROSCOPIC LOW ANTERIOR RESECTION  03/06/2015   03/06/2015  . LAPAROSCOPIC SPLENECTOMY  03/06/2015  . POLYPECTOMY  03/06/2015   Procedure: transverse POLYPECTOMY x 4;  Surgeon: Michael Boston, MD;  Location: [facility];  Service: General;;  . PORTACATH PLACEMENT Right 06/25/2013   Procedure: ULTRA SOUND GUIDED INSERTION PORT-A-CATH;  Surgeon: [name], MD;  Location: [place] [facility];  Service: General;  Laterality: Right;  . SPLENECTOMY, TOTAL N/A 03/06/2015   Procedure: SPLENECTOMY;  Surgeon: Michael Boston, MD;  Location: [facility];  Service: General;  Laterality: N/A;      Social History:     Social History  Substance Use Topics  . Smoking status: Former Smoker    Years: 1.00    Types: Cigarettes    Quit date: 05/24/1971  . Smokeless tobacco: Never Used  . Alcohol use No         Family History :     Family History  Problem Relation Age of Onset  . Colon cancer Sister 29  . Breast cancer Maternal Aunt   . Prostate cancer Maternal Uncle   . Bone cancer Maternal Grandfather   . Prostate cancer Maternal Uncle   . Diabetes Neg Hx   . Thyroid disease Neg Hx        Home Medications:   Prior to Admission medications   Medication Sig Start Date End Date Taking? Authorizing Provider  acetaminophen (TYLENOL) 500 MG tablet Take 500 mg by mouth every 6 (six) hours as needed for moderate pain or headache.    Yes Historical Provider, MD  HYDROmorphone (DILAUDID) 4 MG tablet Take 1 tablet (4 mg total) by mouth every 4 (four) hours as needed for severe pain. 01/05/16  Yes Hayden Pedro, PA-C    IRON PO Take 1 tablet by mouth every morning.   Yes Historical Provider, MD  polyethylene glycol powder (GLYCOLAX/MIRALAX) powder TAKE 34 G (2 DOSES) BY MOUTH DAILY AS NEEDED FOR MILD CONSTIPATION OR MODERATE CONSTIPATION 03/03/14  Yes Concha Norway, MD  vitamin B-12 (CYANOCOBALAMIN) 500 MCG tablet Take 500 mcg by mouth daily.   Yes Historical Provider, MD  XARELTO 20 MG TABS tablet TAKE 1 TABLET BY MOUTH EVERY DAY WITH SUPPER 01/14/16  Yes Truitt Merle, MD  dexamethasone (DECADRON) 2 MG tablet Take 2mg  BID for 1 week, then 2 mg daily for 1 week, then 2mg  every other day for one week then stop Patient not taking: Reported on 02/09/2016 12/17/15   Kyung Rudd, MD  fluconazole (DIFLUCAN) 100 MG tablet Take 1 tablet (100 mg total) by mouth daily. Patient not taking: Reported on 02/09/2016 01/20/16   Susanne Borders, NP  magic mouthwash w/lidocaine SOLN Take 5 mLs by mouth 4 (four) times daily as needed for mouth pain. Patient not taking: Reported on 02/09/2016 01/20/16   Susanne Borders, NP  pantoprazole (PROTONIX) 40 MG tablet Take 1 tablet (40 mg total) by mouth daily. Patient not taking: Reported on 01/20/2016 12/02/15   Lavon Paganini Angiulli, PA-C     Allergies:     Allergies  Allergen Reactions  . Oxycontin [Oxycodone Hcl] Anaphylaxis, Hives and Itching  . Codeine Itching and Nausea And Vomiting     Physical Exam:   Vitals  Blood pressure 122/60, pulse (!) 117, temperature 98.2 F (36.8 C), temperature source Oral, resp. rate 18, weight 66 kg (145 lb 8.1 oz), SpO2 100 %.   1. General Frail middle-aged African-American female lying in hospital bed in no apparent distress,  2. Normal affect and insight, Not Suicidal or Homicidal, Awake Alert, Oriented X 3.  3. No F.N deficits, ALL C.Nerves Intact, Strength 5/5 all 4 extremities, Sensation intact all 4 extremities, Plantars down going.  4. Ears and Eyes appear Normal, Conjunctivae clear, PERRLA. Moist Oral Mucosa.  5. Supple Neck, No JVD, No cervical  lymphadenopathy appriciated, No Carotid Bruits.  6. Symmetrical Chest wall movement, Good air movement bilaterally, CTAB.  7. RRR, No Gallops, Rubs or Murmurs, No Parasternal Heave.  8. Positive Bowel Sounds, abdomen is distended, No tenderness, No organomegaly appriciated,No rebound -guarding or rigidity.  9.  No Cyanosis, Normal Skin Turgor, No Skin Rash or Bruise. Trace edema  10. Good muscle tone,  joints appear normal , no effusions, Normal ROM.  11. No Palpable Lymph Nodes in Neck or Axillae      Data Review:    CBC  Recent Labs Lab 02/09/16 0506  WBC 15.0*  HGB 6.8*  HCT 21.3*  PLT 730*  MCV 76.9*  MCH 24.5*  MCHC 31.9  RDW 23.7*  LYMPHSABS 1.4  MONOABS 0.6  EOSABS 0.0  BASOSABS 0.0   ------------------------------------------------------------------------------------------------------------------  Chemistries   Recent Labs Lab 02/09/16 0506  NA 133*  K 4.0  CL 101  CO2 22  GLUCOSE 96  BUN 8  CREATININE 0.39*  CALCIUM 9.4  AST 30  ALT 17  ALKPHOS 247*  BILITOT 0.8   ------------------------------------------------------------------------------------------------------------------ estimated creatinine clearance is 61.8 mL/min (by C-G formula based on SCr of 0.8 mg/dL). ------------------------------------------------------------------------------------------------------------------ No results for input(s): TSH, T4TOTAL, T3FREE, THYROIDAB in the last 72 hours.  Invalid input(s): FREET3  Coagulation profile  Recent Labs Lab 02/09/16 0506  INR 2.14   ------------------------------------------------------------------------------------------------------------------- No results for input(s):  DDIMER in the last 72 hours. -------------------------------------------------------------------------------------------------------------------  Cardiac Enzymes No results for input(s): CKMB, TROPONINI, MYOGLOBIN in the last 168 hours.  Invalid  input(s): CK ------------------------------------------------------------------------------------------------------------------ No results found for: BNP   ---------------------------------------------------------------------------------------------------------------  Urinalysis    Component Value Date/Time   COLORURINE YELLOW 01/18/2016 [place] 01/18/2016 0753   LABSPEC 1.014 01/18/2016 0753   LABSPEC 1.020 12/15/2015 1430   PHURINE 6.5 01/18/2016 0753   GLUCOSEU NEGATIVE 01/18/2016 0753   GLUCOSEU Negative 12/15/2015 1430   HGBUR TRACE (A) 01/18/2016 0753   BILIRUBINUR NEGATIVE 01/18/2016 0753   BILIRUBINUR Negative 12/15/2015 1430   KETONESUR NEGATIVE 01/18/2016 0753   PROTEINUR NEGATIVE 01/18/2016 0753   UROBILINOGEN 0.2 12/15/2015 1430   NITRITE NEGATIVE 01/18/2016 0753   LEUKOCYTESUR NEGATIVE 01/18/2016 0753   LEUKOCYTESUR Trace 12/15/2015 1430    ----------------------------------------------------------------------------------------------------------------   Imaging Results:    Ct Abdomen Pelvis W Contrast  Result Date: 02/09/2016 CLINICAL DATA:  Diffuse abdominal pain. Abdominal distention. History of colon cancer. Elevated lipase. EXAM: CT ABDOMEN AND PELVIS WITH CONTRAST TECHNIQUE: Multidetector CT imaging of the abdomen and pelvis was performed using the standard protocol following bolus administration of intravenous contrast. CONTRAST:  168mL ISOVUE-300 IOPAMIDOL (ISOVUE-300) INJECTION 61% COMPARISON:  CT abdomen pelvis - 01/16/2016 FINDINGS: Lower chest: Re- demonstrated bilateral pulmonary nodules, the largest of which within the left lower lobe measures 0.9 cm in diameter (image 42, series 4). Minimal subsegmental atelectasis within the bilateral lower lobes. No pleural effusion. Normal heart size.  No pericardial effusion. Hepatobiliary: Normal hepatic contour. Re- demonstrated innumerable hypo attenuating hepatic metastases, progressed in short  interval since the 01/2013 examination with dominant mass within subcapsular aspect the posterior segment of the right lobe of the liver now measuring approximately 5.7 x 4.7 cm (image 22, series 2), previously, 4.4 x 3.5 cm, dominant mass within the posterior subcapsular aspect of the lateral segment of the left lobe of the liver now measuring approximately 4.5 x 4.6 cm (image 34, series 2), previously, 3.6 x 3.4 cm and dominant mass within the central aspect of the right lobe of liver now measuring 5.5 x 4.5 cm (image 20, series 2), previously, 3.8 x 3.2 cm. There is a small amounts of intra-abdominal ascites within the bilateral pericolic gutters. The portal vein remains patent. Normal appearance of the gallbladder given degree distention. No radiopaque gallstones. No intra extrahepatic biliary duct dilatation. Pancreas: Previously questioned peripancreatic stranding is less conspicuous on the present examination. There is homogeneous enhancement of the pancreatic parenchyma. No definitive pancreatic ductal dilatation. No discrete pancreatic mass on this non pancreatic protocol CT scan. Spleen: Surgically absent. A splenule is noted within the left upper abdominal quadrant. Adrenals/Urinary Tract: There is homogeneous enhancement and excretion of the bilateral kidneys. Punctate (approximately 0.8 cm) nonobstructing stone within the inferior pole of the left kidney as well as an additional punctate (approximately 2 mm) nonobstructing stone within the superior pole the left kidney. No definite right-sided renal stones. Note is again made of mild external rotation of the right kidney. No urinary obstruction or perinephric stranding. Normal appearance of the urinary bladder given degree distention. Stomach/Bowel: Ingested enteric contrast extends to the level of the distal small bowel. Post left hemicolectomy with additional enteric suture line located within the mid upper abdomen. Exophytic soft tissue mass adjacent  to the posterior aspect of the surgical anastomosis is grossly unchanged measuring approximately 4.3 x 2.0 cm (coronal image 109, series 5), as is an adjacent exophytic soft tissue mass about  the anterior lateral aspect of the anastomosis measuring approximately 2.1 x 3.3 cm (axial image 73, series 2), however there is now apparent malignant obstruction of the distal anastomosis with upstream distention of the colon. There is diffuse wall thickening involving the colon upstream from the anastomosis. No pneumoperitoneum, pneumatosis or portal venous gas. Normal appearance of the terminal ileum and appendix. There is a small amount of fluid seen throughout the abdomen without definable/drainable fluid collection. Vascular/Lymphatic: Minimal amount of atherosclerotic plaque within a normal caliber abdominal aorta. The major branch vessels of the abdominal aorta appear patent on this non CTA examination. No bulky retroperitoneal, mesenteric, pelvic or inguinal lymphadenopathy. Reproductive: Post hysterectomy. No discrete adnexal lesion. There is a small amount free fluid in the pelvic cul-de-sac. Other: Well-healed midline abdominal incision. Mild diffuse body wall anasarca, most conspicuous about the lateral aspects of the bilateral thighs, left greater than right. Musculoskeletal: No acute or aggressive osseous abnormalities. An intraosseous hemangioma is noted within the T12 vertebral body. Mild-to-moderate multilevel lumbar spine DDD, worse at L4-L5 with disc space height loss, endplate irregularity and sclerosis. IMPRESSION: 1. Post left hemicolectomy with concern for development of malignant obstruction involving the distal colonic anastomosis with wall thickening involving the upstream colon. No evidence of perforation or definable/drainable fluid collection. 2. Progression of hepatic metastatic disease, even compared with recent prior examination performed 01/16/2016 - reference mass within the right lobe of  liver now measures 5.5 cm, previously, 3.8 cm. 3. Decreased conspicuity of previously questioned peripancreatic and duodenal stranding. 4. Grossly unchanged pulmonary metastatic disease within the imaged lung bases. Electronically Signed   By: Sandi Mariscal M.D.   On: 02/09/2016 08:06   Dg Abd Portable 1v  Result Date: 02/09/2016 CLINICAL DATA:  RIGHT lower quadrant abdominal pain, nausea, vomiting and diarrhea, history colon cancer EXAM: PORTABLE ABDOMEN - 1 VIEW COMPARISON:  CT abdomen and pelvis 02/09/2016 FINDINGS: GI contrast within nondistended small bowel loops in the RIGHT mid abdomen. Gaseous distention of transverse colon. Excreted contrast material within renal collecting systems, ureters, and bladder. Dilatation of LEFT renal collecting system and LEFT ureter. Bones unremarkable. IMPRESSION: Gaseous distention of transverse colon without small bowel dilatation. LEFT hydronephrosis and hydroureter. Electronically Signed   By: Lavonia Dana M.D.   On: 02/09/2016 08:41      Assessment & Plan:     1. Abdominal pain, evidence of possible malignant obstruction. Patient is now passing stool likely postobstructive liquefication diarrhea, she appears symptom-free now, I have discussed her case with general surgery they will see the patient's own. We'll keep her nothing by mouth except medications. We will defer NG tube and further management to general surgery.  2. Symptomatically anemia with weakly Hemoccult positive stool. Patient has stage IV colon cancer, recently admitted for anemia and transfusion, I do not think she is actively bleeding, she most likely has a small Mallory-Weiss tear due to nausea vomiting and she was also on xaralto. For now PPI IV drip, hold xaralto, transfused 2 units of packed RBC and monitor H&H.  3. Stage IV metastatic colon cancer. Patient under the care of Dr. Annamaria Boots who has been consulted, patient so far has been resistant to chemotherapy, her long-term prognosis appears  extremely poor, will involve palliative care as well. Note on CT scan not much ascites was noted will monitor.  4. History of PE. For now holding anticoagulation due to #2 and 1 above, if stable heparin drip can be cautiously started on 02/10/2016 and monitored. For now SCDs.  5. Bilateral lower extremity edema. Likely third spacing due to protein calorie malnutrition. Once stable low-dose diuretic, she had lower extremity Dopplers one month ago which were negative.  6. Severe protein calorie malnutrition. Once taking oral diet place on protein supplements.    DVT Prophylaxis SCDs for now, if stable xaralto from 02/10/2016  AM Labs Ordered, also please review Full Orders  Family Communication: Admission, patients condition and plan of care including tests being ordered have been discussed with the patient and Family who indicate understanding and agree with the plan and Code Status.  Code Status Full  Likely DC to  Home in 1-2 days  Condition GUARDED    Consults called: Dr Burr Medico, Berlinda Last care , [facility]  Admission status: Obs   Time spent in minutes : 35   Lala Lund K M.D on 02/09/2016 at 9:28 AM  Between 7am to 7pm - Pager - [phone]. After 7pm go to [email or web address] - password [facility]  [facility] - Office  [phone]

## 2016-02-09 NOTE — Progress Notes (Deleted)
[facility]COL[facility] OFF[facility] PROGRESS NOTE   DIAGNOSIS: Metastatic colon cancer to liver and peritoneum  Chief complaint: Follow-up colon cancer  CURRENT TREATMENT:  Observation  Oncology History   Colorectal cancer, stage IV, obstructing s/p colonic diversion   Staging form: Colon and Rectum, AJCC 7th Edition     Clinical: Stage IVA (TX, N0, M1a) - Signed by Concha Norway, MD on 09/15/2013       Colorectal cancer, stage IV, obstructing s/p stent x2, ostomy, then [facility] 03/06/2015   05/22/2013 - 05/27/2013 Hospital Admission    A/w abdominal distention and constipation for 2 days CT scan of her abdomen and pelvis showed area of bowel wall thickening at the rectosigmoid junction concerning for malignancy.     05/22/2013 Imaging    CT of Abdomen: bowel wall thickening with shouldering of the proximal and distal margins, involving therectosigmoid junction, supicous liver spleen mets. Colonsocopy recommended.      05/23/2013 Tumor Marker    CEA 225.7     05/24/2013 Imaging    MRI of abdomen: 3.7 x 4.4 cm enhancing lesion along the superior spleen, indeterminate but worrisome for metastasis.2.7 x 3.9 cm enhancing lesion in the medial segment left hepatic lobe,      05/25/2013 Pathology Results    Diagnosis Rectum, biopsy, proximal - INVASIVE ADENOCARCINOMA.     05/25/2013 Procedure    Flex sig: (Dr. Ardis Hughs). 5-6 cm obstructing rectosigmoid mass distal end 10 cm from anal verge; mass biopsed and then stented 9 cm long, 22 cm diameter uncovered SEM.      05/25/2013 Initial Diagnosis    Colorectal cancer, stage IV     06/25/2013 Procedure    R port a cath placement.     07/09/2013 - 08/20/2013 Chemotherapy    FOLFOX q 2 weeks started.  Not elgible for clinical trial and bevacimab based on stent and high risk for perforation. She received a total of 4 cycles.      07/30/2013 Tumor Marker    KRAS positive.  p53, APC identified. Mismatch Repair (MMR) preserved.      08/06/2013  Adverse Reaction    Complains of darkening of her hands with peeling.      08/27/2013 Family History    She was seen by Genetic couselor.  She declined testing today, but will call and reschedule an appointment should she decide to pursue testing.     09/07/2013 Imaging    CT abdomen: Mixed response to therapy. Response to therapy of hepatic metastasis. right hepatic hemangiomas are again identified. Other too small to characterize liver lesions are felt to be similar but are indeterminate.. Enlargement of a splenic lesions     09/13/2013 - 06/10/2014 Chemotherapy    Second line chemo FOLFIRI for a total of 16 cycles      09/13/2013 Tumor Marker    CEA 33.5     09/13/2013 Treatment Plan Change    Reviewed scans consistent with mixed response.  Switch to FOLFIRI, she received a total of 16 cycles, with a few breaks due to hospitalization and chemo holiday.      10/29/2013 Adverse Reaction    Patient reports intense abdominal cramping lasting the first few days on chemotherapy.  Starting Hyoscyamine 0.125 mg q 6 hours prn.      11/15/2013 - 11/15/2013 Hospital Admission    Patient presented to ED with migrated stent.  Stent retrieved by Dr. Ardis Hughs.  Imaging negative for perforation.   Discussed in conference with referral to  Dr. Zella Richer made.      11/23/2013 Imaging    CT Chest. 1. Stable left lower lobe 5 mm pulmonary nodule. 2. Interval increase in size splenic mass is concerning formetastatic lesion.3. Interval calcification of the left hepatic lobe metastasis. Noevidence of active residual disease by CT.     07/01/2014 Imaging    CT restaging scan showed stable disease, improved liver lesion.      07/16/2014 - 10/24/2014 Chemotherapy    maintenance chemo with capacitain '1000mg'$ /m2 ('1800mg'$ ) bid started, dose decresed to '1500mg'$  bid on C1D8 due to tolerance issue. Pt declined surgery. Chemo held due to hospitalization and disease progression.      08/16/2014 - 08/20/2014 Hospital Admission    She  was admitted for bowel obstruction from sigmoid colon mass. She declined surgery, and had sigmoid colon stent placement. Her symptoms resolved afterwards.     10/25/2014 - 10/31/2014 Hospital Admission    Admitted for bowel obstruction, CT scan showed sigmoid colon tumor growth through the stent, pt underwent Transverse loop colostomy on 10/28/2014.      11/13/2014 - 11/18/2014 Hospital Admission    she was admitted for transverse loop colostomy prolapse, managed conservatively. CT chest showed RLL PE, discharged home on Xarelto      01/03/2015 - 01/07/2015 Hospital Admission    palliative repair of loop colostomy prolapse with partial colectomy 01/03/15     03/05/2015 Imaging    CT chest, abdomen and pelvis showed known colorectal  Them, multiple new liver metastasis,. Noticed enhancing splenic lesion continues to enlarge, nonobstructive calculus in the left renal collecting system.     03/06/2015 Surgery    Laparoscopic low anterior rectosigmoid to resection, splenectomy, takedown loop colostomy, vaginal cuff peritoneal biopsy, colon polypectomyx4      03/06/2015 Pathology Results    Well differentiated adenocarcinoma in rectosigmoid colon, 32 lymph nodes were negative, vaginal cuff biopsy was positive for metastatic adenocarcinoma, [place] with plasma cell infiltrate and fibrosis, no metastatic carcinoma.     09/12/2015 - 09/17/2015 Hospital Admission    Pt presented with b/l leg weakness, was admitted for brain metastasis      09/13/2015 Imaging    Brain MRI with and without contrast showed a 1.7 cm left frontoparietal mass with moderate vasogenic edema, consistent with solitary metastasis     09/18/2015 - 09/18/2015 Radiation Therapy    SRS to solitary brain met      09/18/2015 -  Radiation Therapy    Stereotactic radiation to oligo brain metastases     CURRENT THERAPY:   supportive care   INTERVAL HISTORY:  [name] [name] 65 y.o. female with a history of Stage IV colon cancer is here for  follow-up. She was hospitalized in late May for persistent leg cramps Stopped she stopped steroids. Her symptom quickly improved when steroids were started. She was discharged subsequently. She has improved significantly since then, she is able to walk independently, occasionally use walker or cane when she goes out. Her right-sided weakness has much improved, she is able to function well at home. She denies any significant headaches, or other neurological symptoms. She was seen by Dr. Lisbeth Renshaw last week, who suggested tapering her steroids. She has good appetite, and skin some weight. She has occasional left-sided abdominal cramps, bowel movements normal, no other new complaints.  Past Medical History:  Diagnosis Date  . Anemia    hx of   . Colon cancer ([place])    dx'd 2014  . Heart murmur    ?  as a child   . Pulmonary embolism ([facility]) 11/2014   . S/P radiation therapy 09/18/15   SRS left frontoparietal 20Gy    ALLERGIES:  is allergic to oxycontin [oxycodone hcl] and codeine.  MEDICATIONS: has a current medication list which includes the following prescription(s): acetaminophen, dexamethasone, fluconazole, hydromorphone, iron, magic mouthwash w/lidocaine, pantoprazole, polyethylene glycol powder, vitamin b-12, and xarelto, and the following Facility-Administered Medications: sodium chloride, diphenhydramine, and furosemide.  SURGICAL HISTORY:  Past Surgical History:  Procedure Laterality Date  . ABDOMINAL HYSTERECTOMY  2005  . BOWEL RESECTION  03/06/2015   Procedure: LOW ANTERIOR BOWEL RESECTION, rigid proctoscopy;  Surgeon: Karie Soda, MD;  Location: [facility];  Service: General;;  . CESAREAN SECTION  1976  . COLON RESECTION N/A 10/28/2014   Procedure: Transverse loop colostomy;  Surgeon: Avel Peace, MD;  Location: [facility];  Service: General;  Laterality: N/A;  . COLON RESECTION N/A 03/06/2015   Procedure: LAPAROSCOPIC takedown loop colostomy ;  Surgeon: Karie Soda, MD;  Location: [facility];   Service: General;  Laterality: N/A;  . COLONIC STENT PLACEMENT N/A 05/25/2013   Procedure: COLONIC STENT PLACEMENT;  Surgeon: Rachael Fee, MD;  Location: [facility];  Service: Endoscopy;  Laterality: N/A;  . COLONIC STENT PLACEMENT N/A 08/19/2014   Procedure: COLONIC STENT PLACEMENT;  Surgeon: Iva Boop, MD;  Location: [facility];  Service: Endoscopy;  Laterality: N/A;  . COLONIC STENT PLACEMENT N/A 08/19/2014   Procedure: COLONIC STENT PLACEMENT;  Surgeon: Iva Boop, MD;  Location: [facility];  Service: Endoscopy;  Laterality: N/A;  . COLOSTOMY REVISION N/A 01/03/2015   Procedure: REVISION OF TRANSVERSE LOOP COLOSTOMY WITH PARTIAL  COLECTOMY;  Surgeon: Avel Peace, MD;  Location: [facility];  Service: General;  Laterality: N/A;  . FLEXIBLE SIGMOIDOSCOPY N/A 05/25/2013   Procedure: Arnell Sieving;  Surgeon: Rachael Fee, MD;  Location: [facility];  Service: Endoscopy;  Laterality: N/A;  needs floro  . FLEXIBLE SIGMOIDOSCOPY N/A 08/19/2014   Procedure: FLEXIBLE SIGMOIDOSCOPY;  Surgeon: Iva Boop, MD;  Location: [facility];  Service: Endoscopy;  Laterality: N/A;  . FLEXIBLE SIGMOIDOSCOPY N/A 08/19/2014   Procedure: FLEXIBLE SIGMOIDOSCOPY;  Surgeon: Iva Boop, MD;  Location: [facility];  Service: Endoscopy;  Laterality: N/A;  . LAPAROSCOPIC LOW ANTERIOR RESECTION  03/06/2015   03/06/2015  . LAPAROSCOPIC SPLENECTOMY  03/06/2015  . POLYPECTOMY  03/06/2015   Procedure: transverse POLYPECTOMY x 4;  Surgeon: Karie Soda, MD;  Location: [facility];  Service: General;;  . PORTACATH PLACEMENT Right 06/25/2013   Procedure: ULTRA SOUND GUIDED INSERTION PORT-A-CATH;  Surgeon: Adolph Pollack, MD;  Location:  SURGERY CENTER;  Service: General;  Laterality: Right;  . SPLENECTOMY, TOTAL N/A 03/06/2015   Procedure: SPLENECTOMY;  Surgeon: Karie Soda, MD;  Location: [facility];  Service: General;  Laterality: N/A;    REVIEW OF SYSTEMS:   Constitutional: Denies fevers,  chills or abnormal weight loss Eyes: Denies blurriness of vision Ears, nose, mouth, throat, and face: Denies mucositis or sore throat Respiratory: Denies cough, dyspnea or wheezes Cardiovascular: Denies palpitation, chest discomfort or lower extremity swelling Gastrointestinal:  Denies nausea, heartburn or change in bowel habits Skin: Denies abnormal skin rashes Lymphatics: Denies new lymphadenopathy or easy bruising Neurological:Denies numbness, tingling or new weaknesses Behavioral/Psych: Mood is stable, no new changes  All other systems were reviewed with the patient and are negative.  PHYSICAL EXAMINATION: ECOG PERFORMANCE STATUS: 1-2 There were no vitals taken for this visit. GENERAL:alert, no distress and comfortable; well-developed, well  nourished.  SKIN: skin color, texture, turgor are normal, no rashes or significant lesions; + R Port, hyperpigmentation of her hands.  EYES: normal, Conjunctiva are pink and non-injected, sclera clear  OROPHARYNX:no exudate, no erythema and lips, buccal mucosa  NECK: supple, thyroid normal size, non-tender, without nodularity  LYMPH: no palpable lymphadenopathy in the cervical, axillary or supraclavicular  LUNGS: clear to auscultation and percussion with normal breathing effort  HEART: Tachycardic with regular rhythm and no murmurs and no lower extremity edema  ABDOMEN:abdomen soft, non-tender and normal bowel sounds. Well-healed surgical scar in the right upper quadrant.  Bowel sounds normal.   Musculoskeletal:no cyanosis of digits and no clubbing  NEURO: alert & oriented x 3 with fluent speech, slightly weakness on the right lower extremity, she is able to walk independently without any assistance EXTREMITIES: no leg swollen.  Labs:  CBC Latest Ref Rng & Units 02/09/2016 01/20/2016 01/18/2016  WBC 4.0 - 10.5 K/uL 15.0(H) 15.0(H) 14.1(H)  Hemoglobin 12.0 - 15.0 g/dL 6.8(LL) 7.8(L) 7.9(L)  Hematocrit 36.0 - 46.0 % 21.3(L) 24.8(L) 23.6(L)    Platelets 150 - 400 K/uL 730(H) 440(H) 459(H)   CMP Latest Ref Rng & Units 02/09/2016 01/20/2016 01/17/2016  Glucose 65 - 99 mg/dL 96 114 98  BUN 6 - 20 mg/dL 8 5.4(L) 6  Creatinine 0.44 - 1.00 mg/dL 0.39(L) 0.6 0.49  Sodium 135 - 145 mmol/L 133(L) 132(L) 137  Potassium 3.5 - 5.1 mmol/L 4.0 3.6 3.9  Chloride 101 - 111 mmol/L 101 - 107  CO2 22 - 32 mmol/L _0 Calcium 8.9 - 10.3 mg/dL 9.4 11.0(H) 10.5(H)  Total Protein 6.5 - 8.1 g/dL 6.3(L) 6.4 -  Total Bilirubin 0.3 - 1.2 mg/dL 0.8 0.46 -  Alkaline Phos 38 - 126 U/L 247(H) 195(H) -  AST 15 - 41 U/L 30 39(H) -  ALT 14 - 54 U/L 17 27 -    PATHOLOGY REPORT Diagnosis 03/06/2015 1. Vagina, biopsy, vaginal cuff - METASTATIC ADENOCARCINOMA, SEE COMMENT. 2. Colon, polyp(s), transverse - TUBULAR ADENOMA WITH HIGH GRADE DYSPLASIA (X2), SEE COMMENT. - TUBULAR ADENOMA (X2). - NO INVASIVE CARCINOMA IDENTIFIED. 3. Colon, colostomy stoma - FINDINGS CONSISTENT WITH COLOSTOMY STOMA. - NO MALIGNANCY IDENTIFIED. 4. Colon, segmental resection for tumor, rectosigmoid cancer - INVASIVE ADENOCARCINOMA, WELL DIFFERENTIATED, SPANNING 7.6 CM. - TUMOR INVADES THROUGH SEROSA. - RESECTION MARGINS ARE NEGATIVE. - THIRTY-TWO OF THIRTY-TWO LYMPH NODES NEGATIVE FOR CARCINOMA (0/32). - SEE ONCOLOGY TABLE. 5. Colon, resection margin (donut), proximal - BENIGN COLONIC MUCOSA. - NO DYSPLASIA OR MALIGNANCY. 6. Colon, resection margin (donut), distal - BENIGN COLONIC MUCOSA. - NO DYSPLASIA OR MALIGNANCY. 7. Spleen, possible metastasis - SPLEEN WITH PLASMA CELL INFILTRATE AND FIBROSIS, SEE COMMENT. - NO METASTATIC CARCINOMA IDENTIFIED.  Pathologic Staging: ypT4a, ypN0, ypM1b   MOLECULAR FEATURES ([facility]):   RADIOGRAPHIC STUDIES: CT abdomen and pelvis with contrast 11/25/2015 IMPRESSION: 1. Recurrence of malignancy at the site of surgery at the mid sigmoid colon, with irregular mass measuring approximately 5.6 x 4.4 x 3.3 cm, surrounding soft  tissue inflammation and presacral stranding, and some degree of bowel dysmotility given stool more proximally. 2. Additional mass extends superiorly from the mid sigmoid colon along the retroperitoneum on the left side, measuring up 2.6 cm in size. 3. Numerous hepatic lesions have increased significantly in size, measuring up to 6.0 cm, with central calcification, compatible with worsening diffuse metastatic disease the liver. 4. Few small nodules at the left lung base, measuring up to 6 mm in size. These are  new or more prominent than on the prior study. Metastatic disease is a concern. 5. 7.4 cm hemangioma at the right hepatic lobe is grossly stable. 6. Nonobstructing left renal stones measure up to 9 mm in size.    ASSESSMENT: TATIONA STECH 65 y.o. female with a history of stage IV colon adenocarcinoma, KRAS mutation(+), on capecitabine maintenance therapy..   1. 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 Now her primary rectosigmoid colon cancer has been removed, she still has multiple small liver metastasis (biopsy confirmed in December 2014), and a small peritoneal disease (positive vaginal cuff biopsy) -She is clinically doing very well, asymptomatic. Again, she is reluctant to restart chemotherapy, due to the concern of side effects from chemotherapy. We previously discussed that early chemo is likely prolong her life more than wait and see, but she really appreciate her current quality of life more than quantity of life. After lengthy discussion again, we decided to continue observation for now -She previously tolerated 5/fu or capecitabine poorly. Single agent irinotecan with avastin (she never had before) would be next line therapy if she needs it in future. -Due to the K-ras mutation, she is not candidate for EGFR inhibitor therapy -Her tumor has stable MSI,  unlikely will response to immunotherapy -We discussed the option of clinical trial, she is not interested. -She recently developed a solitary brain metastasis, received stereotactic radiation, still on steroids. -She does not want systemic therapy, she otherwise feels well, no other complaints except occasional left hip pelvic pain. If her pain gets worse. -I reviewed her CT abdomen and pelvis from 11/25/2015 which was done in the hospital, which showed significant disease progression. She is not very symptomatically right now, we will continue supportive care, and consider palliative radiation if she is symptomatically -I previously discussed hospice with patient when she was hospitalized, she is not ready yet.  2. Brain metastasis -Status post S/P RT, CT had on 11/25/2015 showed slightly increased the size of metastatic lesion, with persistent surrounding edema. -She became symptomatically when she was weaned off steroids in May, Dr. Lisbeth Renshaw is tapering down her dexamethasone again. -She may need small maintenance dose of dexamethasone to prevent symptoms  3. PE diagnosed on 11/13/2014 -Continue Xarelto. She is tolerating well, will continue, no clinical signs of bleeding.  4. Anemia, secondary to chemo, and surgery  -improved, hemoglobin 9.3 today   -continue ferrous sulfate -She is not symptomatic, we'll hold on blood transfusion. -Continue monitoring, will repeat ferritin and iron study every 3 month, to see if she needs IV feraheme   4. Chronic Hypercalcemia, Secondary to hyperparathyroidism -she has elevated PTH and low PTH-rp -will hold on her parathyroidectomy due to her widely spread to metastatic colon cancer -Her calcium is 13.7 today, I'll arrange Zometa infusion in the next few days. -I encouraged her to drink more water to home   PLAN -RTC in 6 weeks for port flush, lab and follow up  -Zometa in the next few days -I'll ask Dr. Lisbeth Renshaw to refill her dexamethasone   All  questions were answered. The patient knows to call the clinic with any problems, questions or concerns. We can certainly see the patient much sooner if necessary.  I spent 20 minutes counseling the patient face to face. The total time spent in the appointment was 25 minutes.  Truitt Merle  02/09/2016

## 2016-02-09 NOTE — ED Notes (Signed)
Bed: [id number] Expected date:  Expected time:  Means of arrival:  Comments: EMS abd pain / N/V hx of colon Ca

## 2016-02-09 NOTE — Progress Notes (Signed)
[name] [name]   DOB:07-19-1950   [id number]   [id number]   Oncology follow up note   Subjective: Pt is well-known to me, I have been following her for metastatic colon cancer, she has been off chemo and on observation since 10/2014. She presented with worsening abdominal pain, nausea, diarrhea with small amount watery stool. No fever or chills.   Objective:  Vitals:   02/09/16 1427 02/09/16 1726  BP: (!) 112/50 (!) 116/53  Pulse: (!) 114 98  Resp: 16 16  Temp: 98 F (36.7 C) 98.2 F (36.8 C)    Body mass index is 27.95 kg/m.  Intake/Output Summary (Last 24 hours) at 02/09/16 2108 Last data filed at 02/09/16 1900  Gross per 24 hour  Intake          1802.75 ml  Output              200 ml  Net          1602.75 ml     Sclerae unicteric  Oropharynx clear, a few small mucosal ulcers in her in the lower lips  No peripheral adenopathy  Lungs clear -- no rales or rhonchi  Heart regular rate and rhythm  Abdomen soft with mild tenderness in the left side abdomen  MSK no focal spinal tenderness, (+) bilateral low extremity edema  Neuro (+) bilateral leg weakness 4/5     CBG (last 3)  No results for input(s): GLUCAP in the last 72 hours.   Labs:  Lab Results  Component Value Date   WBC 15.0 (H) 02/09/2016   HGB 6.8 (LL) 02/09/2016   HCT 21.3 (L) 02/09/2016   MCV 76.9 (L) 02/09/2016   PLT 730 (H) 02/09/2016   NEUTROABS 13.0 (H) 02/09/2016    Urine Studies No results for input(s): UHGB, CRYS in the last 72 hours.  Invalid input(s): UACOL, UAPR, USPG, UPH, UTP, UGL, UKET, UBIL, UNIT, UROB, ULEU, UEPI, UWBC, URBC, UBAC, CAST, UCOM, BILUA  Basic Metabolic Panel:  Recent Labs Lab 02/09/16 0506  NA 133*  K 4.0  CL 101  CO2 22  GLUCOSE 96  BUN 8  CREATININE 0.39*  CALCIUM 9.4   GFR Estimated Creatinine Clearance: 61.8 mL/min (by C-G formula based on SCr of 0.8 mg/dL). Liver Function Tests:  Recent Labs Lab 02/09/16 0506  AST 30  ALT 17  ALKPHOS 247*   BILITOT 0.8  PROT 6.3*  ALBUMIN 1.9*    Recent Labs Lab 02/09/16 0506  LIPASE 71*   No results for input(s): AMMONIA in the last 168 hours. Coagulation profile  Recent Labs Lab 02/09/16 0506  INR 2.14    CBC:  Recent Labs Lab 02/09/16 0506  WBC 15.0*  NEUTROABS 13.0*  HGB 6.8*  HCT 21.3*  MCV 76.9*  PLT 730*   Cardiac Enzymes: No results for input(s): CKTOTAL, CKMB, CKMBINDEX, TROPONINI in the last 168 hours. BNP: Invalid input(s): POCBNP CBG: No results for input(s): GLUCAP in the last 168 hours. D-Dimer No results for input(s): DDIMER in the last 72 hours. Hgb A1c No results for input(s): HGBA1C in the last 72 hours. Lipid Profile No results for input(s): CHOL, HDL, LDLCALC, TRIG, CHOLHDL, LDLDIRECT in the last 72 hours. Thyroid function studies No results for input(s): TSH, T4TOTAL, T3FREE, THYROIDAB in the last 72 hours.  Invalid input(s): FREET3 Anemia work up  Recent Labs  02/09/16 0802  VITAMINB12 >7,500*  FOLATE 5.1*  FERRITIN 179  TIBC 144*  IRON 6*  RETICCTPCT 1.6  Microbiology No results found for this or any previous visit (from the past 240 hour(s)).    Studies:  Ct Abdomen Pelvis W Contrast  Result Date: 02/09/2016 CLINICAL DATA:  Diffuse abdominal pain. Abdominal distention. History of colon cancer. Elevated lipase. EXAM: CT ABDOMEN AND PELVIS WITH CONTRAST TECHNIQUE: Multidetector CT imaging of the abdomen and pelvis was performed using the standard protocol following bolus administration of intravenous contrast. CONTRAST:  15mL ISOVUE-300 IOPAMIDOL (ISOVUE-300) INJECTION 61% COMPARISON:  CT abdomen pelvis - 01/16/2016 FINDINGS: Lower chest: Re- demonstrated bilateral pulmonary nodules, the largest of which within the left lower lobe measures 0.9 cm in diameter (image 42, series 4). Minimal subsegmental atelectasis within the bilateral lower lobes. No pleural effusion. Normal heart size.  No pericardial effusion. Hepatobiliary:  Normal hepatic contour. Re- demonstrated innumerable hypo attenuating hepatic metastases, progressed in short interval since the 01/2013 examination with dominant mass within subcapsular aspect the posterior segment of the right lobe of the liver now measuring approximately 5.7 x 4.7 cm (image 22, series 2), previously, 4.4 x 3.5 cm, dominant mass within the posterior subcapsular aspect of the lateral segment of the left lobe of the liver now measuring approximately 4.5 x 4.6 cm (image 34, series 2), previously, 3.6 x 3.4 cm and dominant mass within the central aspect of the right lobe of liver now measuring 5.5 x 4.5 cm (image 20, series 2), previously, 3.8 x 3.2 cm. There is a small amounts of intra-abdominal ascites within the bilateral pericolic gutters. The portal vein remains patent. Normal appearance of the gallbladder given degree distention. No radiopaque gallstones. No intra extrahepatic biliary duct dilatation. Pancreas: Previously questioned peripancreatic stranding is less conspicuous on the present examination. There is homogeneous enhancement of the pancreatic parenchyma. No definitive pancreatic ductal dilatation. No discrete pancreatic mass on this non pancreatic protocol CT scan. Spleen: Surgically absent. A splenule is noted within the left upper abdominal quadrant. Adrenals/Urinary Tract: There is homogeneous enhancement and excretion of the bilateral kidneys. Punctate (approximately 0.8 cm) nonobstructing stone within the inferior pole of the left kidney as well as an additional punctate (approximately 2 mm) nonobstructing stone within the superior pole the left kidney. No definite right-sided renal stones. Note is again made of mild external rotation of the right kidney. No urinary obstruction or perinephric stranding. Normal appearance of the urinary bladder given degree distention. Stomach/Bowel: Ingested enteric contrast extends to the level of the distal small bowel. Post left hemicolectomy  with additional enteric suture line located within the mid upper abdomen. Exophytic soft tissue mass adjacent to the posterior aspect of the surgical anastomosis is grossly unchanged measuring approximately 4.3 x 2.0 cm (coronal image 109, series 5), as is an adjacent exophytic soft tissue mass about the anterior lateral aspect of the anastomosis measuring approximately 2.1 x 3.3 cm (axial image 73, series 2), however there is now apparent malignant obstruction of the distal anastomosis with upstream distention of the colon. There is diffuse wall thickening involving the colon upstream from the anastomosis. No pneumoperitoneum, pneumatosis or portal venous gas. Normal appearance of the terminal ileum and appendix. There is a small amount of fluid seen throughout the abdomen without definable/drainable fluid collection. Vascular/Lymphatic: Minimal amount of atherosclerotic plaque within a normal caliber abdominal aorta. The major branch vessels of the abdominal aorta appear patent on this non CTA examination. No bulky retroperitoneal, mesenteric, pelvic or inguinal lymphadenopathy. Reproductive: Post hysterectomy. No discrete adnexal lesion. There is a small amount free fluid in the pelvic cul-de-sac. Other: Well-healed  midline abdominal incision. Mild diffuse body wall anasarca, most conspicuous about the lateral aspects of the bilateral thighs, left greater than right. Musculoskeletal: No acute or aggressive osseous abnormalities. An intraosseous hemangioma is noted within the T12 vertebral body. Mild-to-moderate multilevel lumbar spine DDD, worse at L4-L5 with disc space height loss, endplate irregularity and sclerosis. IMPRESSION: 1. Post left hemicolectomy with concern for development of malignant obstruction involving the distal colonic anastomosis with wall thickening involving the upstream colon. No evidence of perforation or definable/drainable fluid collection. 2. Progression of hepatic metastatic disease,  even compared with recent prior examination performed 01/16/2016 - reference mass within the right lobe of liver now measures 5.5 cm, previously, 3.8 cm. 3. Decreased conspicuity of previously questioned peripancreatic and duodenal stranding. 4. Grossly unchanged pulmonary metastatic disease within the imaged lung bases. Electronically Signed   By: Sandi Mariscal M.D.   On: 02/09/2016 08:06   Dg Chest Port 1 View  Result Date: 02/09/2016 CLINICAL DATA:  65 year old female with shortness of breath on exertion. Initial encounter. Current history of metastatic colon cancer. EXAM: PORTABLE CHEST 1 VIEW COMPARISON:  Chest radiographs 10/25/2015 and earlier. FINDINGS: Portable AP semi upright view at 0935 hours. Stable right chest porta cath. Lower lung volumes with increased elevation of the diaphragm. Associated linear and platelike atelectasis at both lung bases. No pneumothorax pulmonary edema. No pleural effusion evident. Stable cardiac size and mediastinal contours. Suggestion of a new 9 mm right upper lobe lung nodule since April (arrow). However, this is well-circumscribed for its size and might be artifact. No other acute pulmonary finding. Paucity of bowel gas in the visualized upper abdomen. IMPRESSION: 1. Possible metastatic right upper lobe pulmonary nodule new since April, but might be artifact (such as something external or on the skin surface). Chest CT (IV contrast preferred) would best evaluate further. 2. Otherwise low lung volumes today with lung base atelectasis. Electronically Signed   By: Genevie Ann M.D.   On: 02/09/2016 10:05   Dg Abd Portable 1v  Result Date: 02/09/2016 CLINICAL DATA:  RIGHT lower quadrant abdominal pain, nausea, vomiting and diarrhea, history colon cancer EXAM: PORTABLE ABDOMEN - 1 VIEW COMPARISON:  CT abdomen and pelvis 02/09/2016 FINDINGS: GI contrast within nondistended small bowel loops in the RIGHT mid abdomen. Gaseous distention of transverse colon. Excreted contrast  material within renal collecting systems, ureters, and bladder. Dilatation of LEFT renal collecting system and LEFT ureter. Bones unremarkable. IMPRESSION: Gaseous distention of transverse colon without small bowel dilatation. LEFT hydronephrosis and hydroureter. Electronically Signed   By: Lavonia Dana M.D.   On: 02/09/2016 08:41    Assessment: 65 y.o.   1. Colonic bowel obstruction secondary to cancer recurrence at anastomosis  2. Metastatic colon cancer, currently off chemo, on observation, per pt's choice  3. History of PE, on Xaralto  4. Anemia in neoplastic disease, worsening 5. Anasarca 6. Severe protein malnutrition  Plan:  -I have reviewed her lab and scan findings and discussed with her -I have spoken with Dr. Lucia Gaskins earlier today who recommend diverging colostomy for malignant bowel obstruction. 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 But she is willing to consider surgical resection without colostomy if it is feasible. Due to her prior multiple surgeries, this may not be feasible, but will defer to Dr. Lucia Gaskins. -I discussed the alternative option of colonic stenting. She is most interested in this. She had colonic stenting by Dr. Ardis Hughs and Carlean Purl before. I have spoken with Dr. Carlean Purl today  and he will see her tomorrow  -I have discussed hospice with her before and again recommend hospice upon discharge. She will think about it -code status was addressed with her again, I recommend DNR/DNI, she still wishes full code, but states she would like to withdraw her life support if she is coded and remains to be on life support afterwards  -Please transfuse her to keep Hb>8 -OK to hold Xarelto for now, and restarted after procedure or surgery. She is on SCDs -she is NPO now, wishes to have some ice chips, I ordered -please give magic mouth wash for her mouth ulcers and pain  I will follow up, please call me for questions.    Truitt Merle,  MD 02/09/2016  9:08 PM

## 2016-02-10 DIAGNOSIS — Z87891 Personal history of nicotine dependence: Secondary | ICD-10-CM | POA: Diagnosis not present

## 2016-02-10 DIAGNOSIS — C7931 Secondary malignant neoplasm of brain: Secondary | ICD-10-CM | POA: Diagnosis present

## 2016-02-10 DIAGNOSIS — R1084 Generalized abdominal pain: Secondary | ICD-10-CM | POA: Diagnosis present

## 2016-02-10 DIAGNOSIS — R64 Cachexia: Secondary | ICD-10-CM | POA: Diagnosis present

## 2016-02-10 DIAGNOSIS — E43 Unspecified severe protein-calorie malnutrition: Secondary | ICD-10-CM | POA: Diagnosis present

## 2016-02-10 DIAGNOSIS — R600 Localized edema: Secondary | ICD-10-CM | POA: Diagnosis present

## 2016-02-10 DIAGNOSIS — K922 Gastrointestinal hemorrhage, unspecified: Secondary | ICD-10-CM | POA: Diagnosis present

## 2016-02-10 DIAGNOSIS — Z86711 Personal history of pulmonary embolism: Secondary | ICD-10-CM | POA: Diagnosis not present

## 2016-02-10 DIAGNOSIS — Z8719 Personal history of other diseases of the digestive system: Secondary | ICD-10-CM | POA: Diagnosis not present

## 2016-02-10 DIAGNOSIS — D649 Anemia, unspecified: Secondary | ICD-10-CM | POA: Diagnosis not present

## 2016-02-10 DIAGNOSIS — R1013 Epigastric pain: Secondary | ICD-10-CM

## 2016-02-10 DIAGNOSIS — Z923 Personal history of irradiation: Secondary | ICD-10-CM | POA: Diagnosis not present

## 2016-02-10 DIAGNOSIS — K56699 Other intestinal obstruction unspecified as to partial versus complete obstruction: Secondary | ICD-10-CM

## 2016-02-10 DIAGNOSIS — Z90710 Acquired absence of both cervix and uterus: Secondary | ICD-10-CM | POA: Diagnosis not present

## 2016-02-10 DIAGNOSIS — R1031 Right lower quadrant pain: Secondary | ICD-10-CM | POA: Diagnosis not present

## 2016-02-10 DIAGNOSIS — C7949 Secondary malignant neoplasm of other parts of nervous system: Secondary | ICD-10-CM

## 2016-02-10 DIAGNOSIS — Z9049 Acquired absence of other specified parts of digestive tract: Secondary | ICD-10-CM | POA: Diagnosis not present

## 2016-02-10 DIAGNOSIS — D630 Anemia in neoplastic disease: Secondary | ICD-10-CM | POA: Diagnosis present

## 2016-02-10 DIAGNOSIS — Z803 Family history of malignant neoplasm of breast: Secondary | ICD-10-CM | POA: Diagnosis not present

## 2016-02-10 DIAGNOSIS — Z515 Encounter for palliative care: Secondary | ICD-10-CM

## 2016-02-10 DIAGNOSIS — Z933 Colostomy status: Secondary | ICD-10-CM | POA: Diagnosis not present

## 2016-02-10 DIAGNOSIS — C189 Malignant neoplasm of colon, unspecified: Secondary | ICD-10-CM | POA: Diagnosis not present

## 2016-02-10 DIAGNOSIS — I2699 Other pulmonary embolism without acute cor pulmonale: Secondary | ICD-10-CM | POA: Diagnosis present

## 2016-02-10 DIAGNOSIS — C7800 Secondary malignant neoplasm of unspecified lung: Secondary | ICD-10-CM | POA: Diagnosis present

## 2016-02-10 DIAGNOSIS — Z9081 Acquired absence of spleen: Secondary | ICD-10-CM | POA: Diagnosis not present

## 2016-02-10 DIAGNOSIS — C19 Malignant neoplasm of rectosigmoid junction: Secondary | ICD-10-CM | POA: Diagnosis present

## 2016-02-10 DIAGNOSIS — Z7901 Long term (current) use of anticoagulants: Secondary | ICD-10-CM | POA: Diagnosis not present

## 2016-02-10 DIAGNOSIS — D72829 Elevated white blood cell count, unspecified: Secondary | ICD-10-CM | POA: Diagnosis present

## 2016-02-10 DIAGNOSIS — K5669 Other intestinal obstruction: Secondary | ICD-10-CM

## 2016-02-10 DIAGNOSIS — R1032 Left lower quadrant pain: Secondary | ICD-10-CM | POA: Diagnosis not present

## 2016-02-10 DIAGNOSIS — C787 Secondary malignant neoplasm of liver and intrahepatic bile duct: Secondary | ICD-10-CM | POA: Diagnosis present

## 2016-02-10 DIAGNOSIS — Z800 Family history of malignant neoplasm of digestive organs: Secondary | ICD-10-CM | POA: Diagnosis not present

## 2016-02-10 LAB — BASIC METABOLIC PANEL
Anion gap: 9 (ref 5–15)
BUN: 6 mg/dL (ref 6–20)
CALCIUM: 8.9 mg/dL (ref 8.9–10.3)
CO2: 22 mmol/L (ref 22–32)
CREATININE: 0.38 mg/dL — AB (ref 0.44–1.00)
Chloride: 107 mmol/L (ref 101–111)
GFR calc Af Amer: >60 mL/min (ref >60)
GFR calc non Af Amer: >60 mL/min (ref >60)
GLUCOSE: 67 mg/dL (ref 65–99)
Potassium: 3.1 mmol/L — ABNORMAL LOW (ref 3.5–5.1)
Sodium: 138 mmol/L (ref 135–145)

## 2016-02-10 LAB — TYPE AND SCREEN
ABO/RH(D): AB POS
Antibody Screen: NEGATIVE
Unit division: 0
Unit division: 0

## 2016-02-10 LAB — CBC
HCT: 25.4 % — ABNORMAL LOW (ref 36.0–46.0)
Hemoglobin: 8.1 g/dL — ABNORMAL LOW (ref 12.0–15.0)
MCH: 25 pg — AB (ref 26.0–34.0)
MCHC: 31.9 g/dL (ref 30.0–36.0)
MCV: 78.4 fL (ref 78.0–100.0)
PLATELETS: 686 10*3/uL — AB (ref 150–400)
RBC: 3.24 MIL/uL — ABNORMAL LOW (ref 3.87–5.11)
RDW: 20.3 % — ABNORMAL HIGH (ref 11.5–15.5)
WBC: 15.9 10*3/uL — ABNORMAL HIGH (ref 4.0–10.5)

## 2016-02-10 MED ORDER — POTASSIUM CHLORIDE CRYS ER 20 MEQ PO TBCR
40.0000 meq | EXTENDED_RELEASE_TABLET | ORAL | Status: AC
  Administered 2016-02-10 (×2): 40 meq via ORAL
  Filled 2016-02-10 (×2): qty 2

## 2016-02-10 MED ORDER — SODIUM CHLORIDE 0.9 % IV SOLN
INTRAVENOUS | Status: DC
  Administered 2016-02-10: 16:00:00 via INTRAVENOUS

## 2016-02-10 MED ORDER — MAGIC MOUTHWASH
5.0000 mL | Freq: Four times a day (QID) | ORAL | Status: DC
  Administered 2016-02-10 – 2016-02-15 (×17): 5 mL via ORAL
  Filled 2016-02-10 (×22): qty 5

## 2016-02-10 MED ORDER — MAGIC MOUTHWASH W/LIDOCAINE
5.0000 mL | Freq: Three times a day (TID) | ORAL | Status: DC
  Filled 2016-02-10 (×2): qty 5

## 2016-02-10 NOTE — Progress Notes (Signed)
PROGRESS NOTE    [name] [name]  [id number] DOB: 1951/05/29 DOA: 02/09/2016  PCP: Kristine Garbe, MD   Brief Narrative:  65 y/o female with metastatic colon cancer who is currently on observation presents with abdominal pain, nausea and vomiting. CT suggests a possible malignant obstruction in distal colon.   Subjective: Currently complaint free.   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  - not on chemo at this time    Symptomatic anemia - transfused 2 U PRBC    GI bleed - hemo occult positive likely due to cancer   severe protein calorie malnutrition and b/l pedal edema - due to underlying illness   DVT prophylaxis: sCDs Code Status: Full code Family Communication:  Disposition Plan: home when stable Consultants:   GI  gen surgery   oncology Procedures:   none Antimicrobials:  Anti-infectives    None       Objective: Vitals:   02/10/16 0436 02/10/16 0633 02/10/16 1100 02/10/16 1348  BP:  (!) 115/52 119/60 (!) 120/54  Pulse:  100 (!) 104 (!) 110  Resp:  18  16  Temp:  98.4 F (36.9 C)  98.4 F (36.9 C)  TempSrc:  Oral  Oral  SpO2:  100%  98%  Weight: 69.4 kg (153 lb)     Height:        Intake/Output Summary (Last 24 hours) at 02/10/16 1804 Last data filed at 02/10/16 1500  Gross per 24 hour  Intake           828.75 ml  Output              200 ml  Net           628.75 ml   Filed Weights   02/09/16 0856 02/09/16 2120 02/10/16 0436  Weight: 66 kg (145 lb 8.1 oz) 66 kg (145 lb 8.1 oz) 69.4 kg (153 lb)    Examination: General exam: Appears comfortable  HEENT: PERRLA, oral mucosa moist, no sclera icterus or thrush Respiratory system: Clear to  auscultation. Respiratory effort normal. Cardiovascular system: S1 & S2 heard, RRR.  No murmurs  Gastrointestinal system: Abdomen soft, non-tender, nondistended. Normal bowel sound. No organomegaly Central nervous system: Alert and oriented. No focal neurological deficits. Extremities: No cyanosis, clubbing or edema Skin: No rashes or ulcers Psychiatry:  Mood & affect appropriate.     Data Reviewed: I have personally reviewed following labs and imaging studies  CBC:  Recent Labs Lab 02/09/16 0506 02/09/16 2157 02/10/16 0320  WBC 15.0*  --  15.9*  NEUTROABS 13.0*  --   --   HGB 6.8* 8.5* 8.1*  HCT 21.3* 25.9* 25.4*  MCV 76.9*  --  78.4  PLT 730*  --  [id number]*   Basic Metabolic Panel:  Recent Labs Lab 02/09/16 0506 02/10/16 0320  NA 133* 138  K 4.0 3.1*  CL 101 107  CO2 22 22  GLUCOSE 96 67  BUN 8 6  CREATININE 0.39* 0.38*  CALCIUM 9.4 8.9   GFR: Estimated Creatinine Clearance: 62.6 mL/min (by C-G formula based on SCr of 0.8 mg/dL). Liver Function Tests:  Recent Labs Lab 02/09/16 0506  AST 30  ALT 17  ALKPHOS 247*  BILITOT 0.8  PROT 6.3*  ALBUMIN 1.9*    Recent Labs Lab 02/09/16 0506  LIPASE 71*   No results for input(s): AMMONIA in the last 168 hours. Coagulation Profile:  Recent Labs Lab 02/09/16 0506  INR 2.14   Cardiac Enzymes: No results for input(s): CKTOTAL, CKMB, CKMBINDEX, TROPONINI in the last 168 hours. BNP (last 3 results) No results for input(s): PROBNP in the last 8760 hours. HbA1C: No results for input(s): HGBA1C in the last 72 hours. CBG: No results for input(s): GLUCAP in the last 168 hours. Lipid Profile: No results for input(s): CHOL, HDL, LDLCALC, TRIG, CHOLHDL, LDLDIRECT in the last 72 hours. Thyroid Function Tests: No results for input(s): TSH, T4TOTAL, FREET4, T3FREE, THYROIDAB in the last 72 hours. Anemia Panel:  Recent Labs  02/09/16 0802  VITAMINB12 >7,500*  FOLATE 5.1*  FERRITIN 179  TIBC 144*  IRON 6*    RETICCTPCT 1.6   Urine analysis:    Component Value Date/Time   COLORURINE YELLOW 02/09/2016 [place] 02/09/2016 0937   LABSPEC >1.046 (H) 02/09/2016 0937   LABSPEC 1.020 12/15/2015 1430   PHURINE 6.5 02/09/2016 0937   GLUCOSEU NEGATIVE 02/09/2016 0937   GLUCOSEU Negative 12/15/2015 1430   HGBUR NEGATIVE 02/09/2016 0937   BILIRUBINUR NEGATIVE 02/09/2016 0937   BILIRUBINUR Negative 12/15/2015 1430   KETONESUR NEGATIVE 02/09/2016 0937   PROTEINUR NEGATIVE 02/09/2016 0937   UROBILINOGEN 0.2 12/15/2015 1430   NITRITE NEGATIVE 02/09/2016 0937   LEUKOCYTESUR NEGATIVE 02/09/2016 0937   LEUKOCYTESUR Trace 12/15/2015 1430   Sepsis Labs: @LABRCNTIP (procalcitonin:4,lacticidven:4) )No results found for this or any previous visit (from the past 240 hour(s)).       Radiology Studies: Ct Abdomen Pelvis W Contrast  Result Date: 02/09/2016 CLINICAL DATA:  Diffuse abdominal pain. Abdominal distention. History of colon cancer. Elevated lipase. EXAM: CT ABDOMEN AND PELVIS WITH CONTRAST TECHNIQUE: Multidetector CT imaging of the abdomen and pelvis was performed using the standard protocol following bolus administration of intravenous contrast. CONTRAST:  115mL ISOVUE-300 IOPAMIDOL (ISOVUE-300) INJECTION 61% COMPARISON:  CT abdomen pelvis - 01/16/2016 FINDINGS: Lower chest: Re- demonstrated bilateral pulmonary nodules, the largest of which within the left lower lobe measures 0.9 cm in diameter (image 42, series 4). Minimal subsegmental atelectasis within the bilateral lower lobes. No pleural effusion. Normal heart size.  No pericardial effusion. Hepatobiliary: Normal hepatic contour. Re- demonstrated innumerable hypo attenuating hepatic metastases, progressed in short interval since the 01/2013 examination with dominant mass within subcapsular aspect the posterior segment of the right lobe of the liver now measuring approximately 5.7 x 4.7 cm (image 22, series 2), previously, 4.4 x 3.5  cm, dominant mass within the posterior subcapsular aspect of the lateral segment of the left lobe of the liver now measuring approximately 4.5 x 4.6 cm (image 34, series 2), previously, 3.6 x 3.4 cm and dominant mass within the central aspect of the right lobe of liver now measuring 5.5 x 4.5 cm (image 20, series 2), previously, 3.8 x 3.2 cm. There is a small amounts of intra-abdominal ascites within the bilateral pericolic gutters. The portal vein remains patent. Normal appearance of the gallbladder given degree distention. No radiopaque gallstones. No intra extrahepatic biliary duct dilatation. Pancreas: Previously questioned peripancreatic stranding is less conspicuous on the present examination. There is homogeneous enhancement of the pancreatic parenchyma. No definitive pancreatic ductal dilatation. No discrete pancreatic mass on this non pancreatic protocol CT scan. Spleen: Surgically absent. A splenule is noted within  the left upper abdominal quadrant. Adrenals/Urinary Tract: There is homogeneous enhancement and excretion of the bilateral kidneys. Punctate (approximately 0.8 cm) nonobstructing stone within the inferior pole of the left kidney as well as an additional punctate (approximately 2 mm) nonobstructing stone within the superior pole the left kidney. No definite right-sided renal stones. Note is again made of mild external rotation of the right kidney. No urinary obstruction or perinephric stranding. Normal appearance of the urinary bladder given degree distention. Stomach/Bowel: Ingested enteric contrast extends to the level of the distal small bowel. Post left hemicolectomy with additional enteric suture line located within the mid upper abdomen. Exophytic soft tissue mass adjacent to the posterior aspect of the surgical anastomosis is grossly unchanged measuring approximately 4.3 x 2.0 cm (coronal image 109, series 5), as is an adjacent exophytic soft tissue mass about the anterior lateral aspect  of the anastomosis measuring approximately 2.1 x 3.3 cm (axial image 73, series 2), however there is now apparent malignant obstruction of the distal anastomosis with upstream distention of the colon. There is diffuse wall thickening involving the colon upstream from the anastomosis. No pneumoperitoneum, pneumatosis or portal venous gas. Normal appearance of the terminal ileum and appendix. There is a small amount of fluid seen throughout the abdomen without definable/drainable fluid collection. Vascular/Lymphatic: Minimal amount of atherosclerotic plaque within a normal caliber abdominal aorta. The major branch vessels of the abdominal aorta appear patent on this non CTA examination. No bulky retroperitoneal, mesenteric, pelvic or inguinal lymphadenopathy. Reproductive: Post hysterectomy. No discrete adnexal lesion. There is a small amount free fluid in the pelvic cul-de-sac. Other: Well-healed midline abdominal incision. Mild diffuse body wall anasarca, most conspicuous about the lateral aspects of the bilateral thighs, left greater than right. Musculoskeletal: No acute or aggressive osseous abnormalities. An intraosseous hemangioma is noted within the T12 vertebral body. Mild-to-moderate multilevel lumbar spine DDD, worse at L4-L5 with disc space height loss, endplate irregularity and sclerosis. IMPRESSION: 1. Post left hemicolectomy with concern for development of malignant obstruction involving the distal colonic anastomosis with wall thickening involving the upstream colon. No evidence of perforation or definable/drainable fluid collection. 2. Progression of hepatic metastatic disease, even compared with recent prior examination performed 01/16/2016 - reference mass within the right lobe of liver now measures 5.5 cm, previously, 3.8 cm. 3. Decreased conspicuity of previously questioned peripancreatic and duodenal stranding. 4. Grossly unchanged pulmonary metastatic disease within the imaged lung bases.  Electronically Signed   By: Sandi Mariscal M.D.   On: 02/09/2016 08:06   Dg Chest Port 1 View  Result Date: 02/09/2016 CLINICAL DATA:  65 year old female with shortness of breath on exertion. Initial encounter. Current history of metastatic colon cancer. EXAM: PORTABLE CHEST 1 VIEW COMPARISON:  Chest radiographs 10/25/2015 and earlier. FINDINGS: Portable AP semi upright view at 0935 hours. Stable right chest porta cath. Lower lung volumes with increased elevation of the diaphragm. Associated linear and platelike atelectasis at both lung bases. No pneumothorax pulmonary edema. No pleural effusion evident. Stable cardiac size and mediastinal contours. Suggestion of a new 9 mm right upper lobe lung nodule since April (arrow). However, this is well-circumscribed for its size and might be artifact. No other acute pulmonary finding. Paucity of bowel gas in the visualized upper abdomen. IMPRESSION: 1. Possible metastatic right upper lobe pulmonary nodule new since April, but might be artifact (such as something external or on the skin surface). Chest CT (IV contrast preferred) would best evaluate further. 2. Otherwise low lung volumes today with lung  base atelectasis. Electronically Signed   By: Genevie Ann M.D.   On: 02/09/2016 10:05   Dg Abd Portable 1v  Result Date: 02/09/2016 CLINICAL DATA:  RIGHT lower quadrant abdominal pain, nausea, vomiting and diarrhea, history colon cancer EXAM: PORTABLE ABDOMEN - 1 VIEW COMPARISON:  CT abdomen and pelvis 02/09/2016 FINDINGS: GI contrast within nondistended small bowel loops in the RIGHT mid abdomen. Gaseous distention of transverse colon. Excreted contrast material within renal collecting systems, ureters, and bladder. Dilatation of LEFT renal collecting system and LEFT ureter. Bones unremarkable. IMPRESSION: Gaseous distention of transverse colon without small bowel dilatation. LEFT hydronephrosis and hydroureter. Electronically Signed   By: Lavonia Dana M.D.   On: 02/09/2016  08:41      Scheduled Meds: . ferrous sulfate  325 mg Oral Q breakfast  . magic mouthwash  5 mL Oral QID  . pantoprazole  40 mg Oral Daily  . sodium chloride flush  10-40 mL Intracatheter Q12H  . sodium chloride flush  3 mL Intravenous Q12H  . vitamin B-12  500 mcg Oral Daily   Continuous Infusions: . sodium chloride 100 mL/hr at 02/10/16 1553     LOS: 0 days    Time spent in minutes: 4    [place], MD [facility] Pager: [email or web address] Password [facility] 02/10/2016, 6:04 PM

## 2016-02-10 NOTE — Consult Note (Signed)
Consultation Note Date: 02/10/2016   Patient Name: [name] [name]  DOB: Jun 26, 1951  MRN: [id number]  Age / Sex: 65 y.o., female  PCP: Lin Landsman, MD Referring Physician: Debbe Odea, MD  Reason for Consultation: Establishing goals of care  HPI/Patient Profile: 65 y.o. female  admitted on 02/09/2016   Clinical Assessment and Goals of Care:  Life limiting illness: Metastatic colon cancer  65 year old lady with a past medical history significant for metastatic colon cancer. She follows closely with medical oncology. Chart review notes that she has been off chemotherapy and has been on observation since April 2016. Patient presented with abdominal discomfort and nausea diarrhea passing small amount of watery stools. She has been admitted for colonic bowel obstruction deemed likely secondary to cancer recurrence at anastomosis site. She has underlying history of pulmonary embolism and is maintained on oral anticoagulation with Xarelto. 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 Appreciate oncology input. Note reviewed in detail. Patient has been seen and evaluated by surgery. Patient is to be evaluated shortly by gastroenterology. As per surgery, their final recommendations are for diverging colostomy for malignant bowel obstruction. It is reported that the patient has firmly declined this intervention. Another alternative that is being explored at this time is one of colonic stenting. Medical oncology notes also state that the subject of DO NOT RESUSCITATE/DO NOT INTUBATE and addition of hospice as an extra layer of support has been broached with the patient.  Palliative care consultation has been placed for advancement of the above-mentioned conversation, additional discussions in  decision-making.  Patient is an age-appropriate appearing weak frail cachectic lady sitting up in bed. I introduced myself and palliative care as follows: Palliative medicine is specialized medical care for people living with serious illness. It focuses on providing relief from the symptoms and stress of a serious illness. The goal is to improve quality of life for both the patient and the family.  Patient complains of abdominal discomfort and distention. She has epigastric discomfort. Discussed with patient about pain management. Discussed with patient about serious life limiting illness of stage IV colorectal cancer with recent imaging showing possibly malignant obstruction, worsening hepatic metastases. Gently approached goals of care decision-making. Patient awaiting input and discussion with gastroenterology, however she is open to having further discussions with the palliative team on 02-11-16. We will follow-up. Thank you for the consult. Continue current mode of care for now. No family present at the bedside at the time of initial palliative consultation today.  NEXT OF KIN  Spouse Bernard at [phone]. Patient also has 2 daughters.  SUMMARY OF RECOMMENDATIONS    Full code for now.  Patient is against having colostomy She is awaiting further discussions with GI Briefly introduced palliative care, patient is agreeable to having further conversations with [place] on 02-11-16.  Continue current scope of management for now.   Code Status/Advance Care Planning:  Full code    Symptom Management:    continue  current management.   Palliative Prophylaxis:   Bowel Regimen   Psycho-social/Spiritual:   Desire for further Chaplaincy support:no  Additional Recommendations: Caregiving  Support/Resources  Prognosis:   Unable to determine  Discharge Planning: To Be Determined      Primary Diagnoses: Present on Admission: . GI bleed . Symptomatic anemia . Colorectal cancer, stage IV,  obstructing s/p stent x2, ostomy, then LAR 03/06/2015 . 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 Pulmonary embolism ([place])   I have reviewed the medical record, interviewed the patient and family, and examined the patient. The following aspects are pertinent.  Past Medical History:  Diagnosis Date  . Anemia    hx of   . Colon cancer ([place])    dx'd 2014  . Heart murmur    ? as a child   . Pulmonary embolism ([place]) 11/2014   . S/P radiation therapy 09/18/15   SRS left frontoparietal 20Gy   Social History   Social History  . Marital status: Married    Spouse name: N/A  . Number of children: 3  . Years of education: N/A   Occupational History  .  From Home   Social History Main Topics  . Smoking status: Former Smoker    Years: 1.00    Types: Cigarettes    Quit date: 05/24/1971  . Smokeless tobacco: Never Used  . Alcohol use No  . Drug use: No  . Sexual activity: Not Asked   Other Topics Concern  . None   Social History Narrative  . None   Family History  Problem Relation Age of Onset  . Colon cancer Sister 4  . Breast cancer Maternal Aunt   . Prostate cancer Maternal Uncle   . Bone cancer Maternal Grandfather   . Prostate cancer Maternal Uncle   . Diabetes Neg Hx   . Thyroid disease Neg Hx    Scheduled Meds: . ferrous sulfate  325 mg Oral Q breakfast  . magic mouthwash  5 mL Oral QID  . pantoprazole  40 mg Oral Daily  . potassium chloride  40 mEq Oral Q4H  . sodium chloride flush  10-40 mL Intracatheter Q12H  . sodium chloride flush  3 mL Intravenous Q12H  . vitamin B-12  500 mcg Oral Daily   Continuous Infusions: . sodium chloride 100 mL/hr at 02/10/16 1100   PRN Meds:.diphenhydrAMINE, HYDROmorphone (DILAUDID) injection, HYDROmorphone, ondansetron **OR** ondansetron (ZOFRAN) IV, sodium chloride flush, sodium chloride flush Medications Prior to Admission:  Prior to Admission medications   Medication Sig Start Date End Date Taking? Authorizing Provider    acetaminophen (TYLENOL) 500 MG tablet Take 500 mg by mouth every 6 (six) hours as needed for moderate pain or headache.    Yes Historical Provider, MD  HYDROmorphone (DILAUDID) 4 MG tablet Take 1 tablet (4 mg total) by mouth every 4 (four) hours as needed for severe pain. 01/05/16  Yes Hayden Pedro, PA-C  IRON PO Take 1 tablet by mouth every morning.   Yes Historical Provider, MD  polyethylene glycol powder (GLYCOLAX/MIRALAX) powder TAKE 34 G (2 DOSES) BY MOUTH DAILY AS NEEDED FOR MILD CONSTIPATION OR MODERATE CONSTIPATION 03/03/14  Yes Concha Norway, MD  vitamin B-12 (CYANOCOBALAMIN) 500 MCG tablet Take 500 mcg by mouth daily.   Yes Historical Provider, MD  XARELTO 20 MG TABS tablet TAKE 1 TABLET BY MOUTH EVERY DAY WITH SUPPER 01/14/16  Yes Truitt Merle, MD  dexamethasone (DECADRON) 2 MG tablet Take 2mg  BID for 1 week, then 2  mg daily for 1 week, then 2mg  every other day for one week then stop Patient not taking: Reported on 02/09/2016 12/17/15   Kyung Rudd, MD  fluconazole (DIFLUCAN) 100 MG tablet Take 1 tablet (100 mg total) by mouth daily. Patient not taking: Reported on 02/09/2016 01/20/16   Susanne Borders, NP  magic mouthwash w/lidocaine SOLN Take 5 mLs by mouth 4 (four) times daily as needed for mouth pain. Patient not taking: Reported on 02/09/2016 01/20/16   Susanne Borders, NP  pantoprazole (PROTONIX) 40 MG tablet Take 1 tablet (40 mg total) by mouth daily. Patient not taking: Reported on 01/20/2016 12/02/15   Lavon Paganini Angiulli, PA-C   Allergies  Allergen Reactions  . Oxycontin [Oxycodone Hcl] Anaphylaxis, Hives and Itching  . Codeine Itching and Nausea And Vomiting   Review of Systems + for abdominal pain, + for abdominal distension.   Physical Exam NAD Weak frail Abdomen distended Pain in epigastric area S1 S2 Lungs clear  + LE edema  Vital Signs: BP (!) 120/54 (BP Location: Right Arm)   Pulse (!) 110   Temp 98.4 F (36.9 C) (Oral)   Resp 16   Ht 5' (1.524 m)   Wt 69.4 kg  (153 lb)   SpO2 98%   BMI 29.88 kg/m  Pain Assessment: 0-10   Pain Score: 3    SpO2: SpO2: 98 % O2 Device:SpO2: 98 % O2 Flow Rate: .   IO: Intake/output summary:  Intake/Output Summary (Last 24 hours) at 02/10/16 1523 Last data filed at 02/10/16 1338  Gross per 24 hour  Intake           454.75 ml  Output              200 ml  Net           254.75 ml    LBM: Last BM Date: 02/10/16 Baseline Weight: Weight: 66 kg (145 lb 8.1 oz) Most recent weight: Weight: 69.4 kg (153 lb)     Palliative Assessment/Data:   Flowsheet Rows   Flowsheet Row Most Recent Value  Intake Tab  Referral Department  Hospitalist  Unit at Time of Referral  Oncology Unit  Palliative Care Primary Diagnosis  Cancer  Date Notified  02/09/16  Palliative Care Type  New Palliative care  Reason for referral  Clarify Goals of Care  Date of Admission  02/09/16  Date first seen by Palliative Care  02/10/16  # of days IP prior to Palliative referral  0  Clinical Assessment  Palliative Performance Scale Score  30%  Pain Max last 24 hours  6  Pain Min Last 24 hours  5  Dyspnea Max Last 24 Hours  5  Dyspnea Min Last 24 hours  4  Nausea Max Last 24 Hours  5  Nausea Min Last 24 Hours  4  Psychosocial & Spiritual Assessment  Palliative Care Outcomes  Patient/Family meeting held?  Yes  Who was at the meeting?  patient.   Palliative Care Outcomes  Clarified goals of care  Palliative Care follow-up planned  Yes, Home      Time In:  1400  Time Out:  1500 Time Total:  60 min Greater than 50%  of this time was spent counseling and coordinating care related to the above assessment and plan.  Signed by: Loistine Chance, MD  [phone]  Please contact Palliative Medicine Team phone at [phone] for questions and concerns.  For individual provider: See Amion password [email or web address].

## 2016-02-10 NOTE — Consult Note (Signed)
Referring Provider: Dr. Burr Medico Primary Care Physician:  Kristine Garbe, MD Primary Gastroenterologist:  ?  Reason for Consultation:  Colonic obstruction  HPI: [name] [name] is a 65 y.o. 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 Burr Medico.  Has not been on chemo since some time in 2016.  Also has history of recurrent anemia, PE on xarelto, recent admission for symptomatic anemia one month ago.  Is on decadron for brain mets/swelling.  Patient with above history comes to the ER with 2 day history of generalized non-radiating abdominal pain, nausea, vomiting.  In the ER CT scan suggestive of possible malignant obstruction of her GI tract (listed below).  She was also found to have reoccurrence of her anemia and weakly Hemoccult positive; Hgb 6.8 grams.  Was transfused with 2 units PRBC's.  Was seen by surgery.  Patient does not want another colostomy as she had issues with prolapsing colostomy previously.  GI being consulted for consideration of colonic stent placement.  This has been attempted with her on two other occasions during her course.  Is passong gas and having liquid stool and tolerating liquid diet w/o vomiting. Not much pain.  IMPRESSION: Peripancreatic fluid/inflammatory changes, correlate for acute pancreatitis.  Additional inflammatory changes involving the proximal duodenum, suggesting duodenitis, possibly secondary.  Status post left hemicolectomy. Associated 4.1 cm soft tissue mass at the surgical margin, stable versus mildly increased. Mass extends posteriorly into the presacral space. Adjacent pericolonic extension anteriorly/inferiorly.  Associated 2.3 cm soft tissue implant/left common iliac node, mildly decreased.  Progression of multifocal hepatic metastases, including a dominant 7.7 cm mass in the left hepatic lobe, increased.  Progression of small pulmonary metastases in  the bilateral lower lobes, measuring up to 9 mm.  Additional ancillary findings as above.  She tells me that she is feeling better today.  Passing some stool.   Past Medical History:  Diagnosis Date  . Anemia    hx of   . Colon cancer ([place])    dx'd 2014  . Heart murmur    ? as a child   . Pulmonary embolism ([place]) 11/2014   . S/P radiation therapy 09/18/15   SRS left frontoparietal 20Gy    Past Surgical History:  Procedure Laterality Date  . ABDOMINAL HYSTERECTOMY  2005  . BOWEL RESECTION  03/06/2015   Procedure: LOW ANTERIOR BOWEL RESECTION, rigid proctoscopy;  Surgeon: Michael Boston, MD;  Location: [facility];  Service: General;;  . [place]  . COLON RESECTION N/A 10/28/2014   Procedure: Transverse loop colostomy;  Surgeon: [name], MD;  Location: [facility];  Service: General;  Laterality: N/A;  . COLON RESECTION N/A 03/06/2015   Procedure: LAPAROSCOPIC takedown loop colostomy ;  Surgeon: Michael Boston, MD;  Location: [facility];  Service: General;  Laterality: N/A;  . COLONIC STENT PLACEMENT N/A 05/25/2013   Procedure: COLONIC STENT PLACEMENT;  Surgeon: [name], MD;  Location: [facility];  Service: Endoscopy;  Laterality: N/A;  . COLONIC STENT PLACEMENT N/A 08/19/2014   Procedure: COLONIC STENT PLACEMENT;  Surgeon: Gatha Mayer, MD;  [facility]: [facility];  Service: Endoscopy;  Laterality: N/A;  . COLONIC STENT PLACEMENT N/A 08/19/2014   Procedure: COLONIC STENT PLACEMENT;  Surgeon: Gatha Mayer, MD;  [facility];  Service: Endoscopy;  Laterality: N/A;  . COLOSTOMY REVISION N/A 01/03/2015   Procedure: REVISION OF TRANSVERSE LOOP COLOSTOMY WITH PARTIAL  COLECTOMY;  Surgeon: [name], MD;  [facility]: [facility];  Service: General;  Laterality: N/A;  . FLEXIBLE SIGMOIDOSCOPY N/A 05/25/2013   Procedure: Beryle Quant;  Surgeon: [name], MD;  Location: [facility];  Service: Endoscopy;  Laterality: N/A;  needs floro  . FLEXIBLE  SIGMOIDOSCOPY N/A 08/19/2014   Procedure: FLEXIBLE SIGMOIDOSCOPY;  Surgeon: Gatha Mayer, MD;  [facility];  Service: Endoscopy;  Laterality: N/A;  . FLEXIBLE SIGMOIDOSCOPY N/A 08/19/2014   Procedure: FLEXIBLE SIGMOIDOSCOPY;  Surgeon: Gatha Mayer, MD;  [facility];  Service: Endoscopy;  Laterality: N/A;  . LAPAROSCOPIC LOW ANTERIOR RESECTION  03/06/2015   03/06/2015  . LAPAROSCOPIC SPLENECTOMY  03/06/2015  . POLYPECTOMY  03/06/2015   Procedure: transverse POLYPECTOMY x 4;  Surgeon: Michael Boston, MD;  Location: [facility];  Service: General;;  . PORTACATH PLACEMENT Right 06/25/2013   Procedure: ULTRA SOUND GUIDED INSERTION PORT-A-CATH;  Surgeon: [name], MD;  Location: [facility];  Service: General;  Laterality: Right;  . SPLENECTOMY, TOTAL N/A 03/06/2015   Procedure: SPLENECTOMY;  Surgeon: Michael Boston, MD;  Location: [facility];  Service: General;  Laterality: N/A;    Prior to Admission medications   Medication Sig Start Date End Date Taking? Authorizing Provider  acetaminophen (TYLENOL) 500 MG tablet Take 500 mg by mouth every 6 (six) hours as needed for moderate pain or headache.    Yes Historical Provider, MD  HYDROmorphone (DILAUDID) 4 MG tablet Take 1 tablet (4 mg total) by mouth every 4 (four) hours as needed for severe pain. 01/05/16  Yes Hayden Pedro, PA-C  IRON PO Take 1 tablet by mouth every morning.   Yes Historical Provider, MD  polyethylene glycol powder (GLYCOLAX/MIRALAX) powder TAKE 34 G (2 DOSES) BY MOUTH DAILY AS NEEDED FOR MILD CONSTIPATION OR MODERATE CONSTIPATION 03/03/14  Yes Concha Norway, MD  vitamin B-12 (CYANOCOBALAMIN) 500 MCG tablet Take 500 mcg by mouth daily.   Yes Historical Provider, MD  XARELTO 20 MG TABS tablet TAKE 1 TABLET BY MOUTH EVERY DAY WITH SUPPER 01/14/16  Yes Truitt Merle, MD  dexamethasone (DECADRON) 2 MG tablet Take 2mg  BID for 1 week, then 2 mg daily for 1 week, then 2mg  every other day for one week then  stop Patient not taking: Reported on 02/09/2016 12/17/15   Kyung Rudd, MD  fluconazole (DIFLUCAN) 100 MG tablet Take 1 tablet (100 mg total) by mouth daily. Patient not taking: Reported on 02/09/2016 01/20/16   Susanne Borders, NP  magic mouthwash w/lidocaine SOLN Take 5 mLs by mouth 4 (four) times daily as needed for mouth pain. Patient not taking: Reported on 02/09/2016 01/20/16   Susanne Borders, NP  pantoprazole (PROTONIX) 40 MG tablet Take 1 tablet (40 mg total) by mouth daily. Patient not taking: Reported on 01/20/2016 12/02/15   Lavon Paganini Angiulli, PA-C    Current Facility-Administered Medications  Medication Dose Route Frequency Provider Last Rate Last Dose  . 0.9 %  sodium chloride infusion   Intravenous Continuous Thurnell Lose, MD 75 mL/hr at 02/09/16 1725    . diphenhydrAMINE (BENADRYL) injection 25 mg  25 mg Intravenous Q6H PRN Thurnell Lose, MD      . ferrous sulfate tablet 325 mg  325 mg Oral Q breakfast Thurnell Lose, MD   325 mg at 02/10/16 0819  . HYDROmorphone (DILAUDID) injection 1 mg  1 mg Intravenous Q4H PRN Thurnell Lose, MD   1 mg at 02/10/16 0650  . HYDROmorphone (DILAUDID)  tablet 4 mg  4 mg Oral Q4H PRN Thurnell Lose, MD   4 mg at 02/09/16 1039  . ondansetron (ZOFRAN) tablet 4 mg  4 mg Oral Q6H PRN Thurnell Lose, MD       Or  . ondansetron (ZOFRAN) injection 4 mg  4 mg Intravenous Q6H PRN Thurnell Lose, MD      . pantoprazole (PROTONIX) EC tablet 40 mg  40 mg Oral Daily Thurnell Lose, MD   40 mg at 02/09/16 1039  . sodium chloride flush (NS) 0.9 % injection 10-40 mL  10-40 mL Intracatheter Q12H Thurnell Lose, MD      . sodium chloride flush (NS) 0.9 % injection 10-40 mL  10-40 mL Intracatheter PRN Thurnell Lose, MD      . sodium chloride flush (NS) 0.9 % injection 10-40 mL  10-40 mL Intracatheter PRN Thurnell Lose, MD   10 mL at 02/10/16 0322  . sodium chloride flush (NS) 0.9 % injection 3 mL  3 mL Intravenous Q12H Thurnell Lose, MD   3 mL at  02/09/16 2200  . vitamin B-12 (CYANOCOBALAMIN) tablet 500 mcg  500 mcg Oral Daily Thurnell Lose, MD   500 mcg at 02/09/16 1343    Allergies as of 02/09/2016 - Review Complete 02/09/2016  Allergen Reaction Noted  . Oxycontin [oxycodone hcl] Anaphylaxis, Hives, and Itching 11/13/2014  . Codeine Itching and Nausea And Vomiting 05/22/2013    Family History  Problem Relation Age of Onset  . Colon cancer Sister 68  . Breast cancer Maternal Aunt   . Prostate cancer Maternal Uncle   . Bone cancer Maternal Grandfather   . Prostate cancer Maternal Uncle   . Diabetes Neg Hx   . Thyroid disease Neg Hx     Social History   Social History  . Marital status: Married    Spouse name: N/A  . Number of children: 3  . Years of education: N/A   Occupational History  .  From Home   Social History Main Topics  . Smoking status: Former Smoker    Years: 1.00    Types: Cigarettes    Quit date: 05/24/1971  . Smokeless tobacco: Never Used  . Alcohol use No  . Drug use: No  . Sexual activity: Not on file   Other Topics Concern  . Not on file   Social History Narrative  . No narrative on file    Review of Systems:  + fatigue and weakness Ten point ROS is O/W negative except as mentioned in HPI.  Physical Exam: Vital signs in last 24 hours: Temp:  [97.7 F (36.5 C)-98.7 F (37.1 C)] 98.4 F (36.9 C) (08/08 [id number]) Pulse Rate:  [98-114] 100 (08/08 0633) Resp:  [16-20] 18 (08/08 0633) BP: (112-132)/(50-68) 115/52 (08/08 0633) SpO2:  [100 %] 100 % (08/08 [id number]) Weight:  [145 lb 8.1 oz (66 kg)-153 lb (69.4 kg)] 153 lb (69.4 kg) (08/08 0436) Last BM Date: 02/10/16 General:  Alert, Well-developed, well-nourished, pleasant and cooperative in NAD Head:  Normocephalic and atraumatic. Eyes:  Sclera clear, no icterus.  Conjunctiva pink. Ears:  Normal auditory acuity. Mouth:  No deformity or lesions.   Lungs:  Clear throughout to auscultation.  No wheezes, crackles, or rhonchi.  Heart:   Regular rate and rhythm; no murmurs, clicks, rubs,  or gallops. Abdomen:  Soft, slightly distended.  BS present but quiet/hypoactive.  Mild TTP. Msk:  Symmetrical without gross deformities. Pulses:  Normal  pulses noted. Extremities:  Without clubbing or edema. Neurologic:  Alert and oriented x 4;  grossly normal neurologically. Skin:  Intact without significant lesions or rashes. Psych:  Alert and cooperative. Normal mood and affect.  Intake/Output from previous day: 08/07 0701 - 08/08 0700 In: 802.8 [I.V.:118.8; Blood:684] Out: 200 [Urine:200]  Lab Results:  Recent Labs  02/09/16 0506 02/09/16 2157 02/10/16 0320  WBC 15.0*  --  15.9*  HGB 6.8* 8.5* 8.1*  HCT 21.3* 25.9* 25.4*  PLT 730*  --  686*   BMET  Recent Labs  02/09/16 0506 02/10/16 0320  NA 133* 138  K 4.0 3.1*  CL 101 107  CO2 22 22  GLUCOSE 96 67  BUN 8 6  CREATININE 0.39* 0.38*  CALCIUM 9.4 8.9   LFT  Recent Labs  02/09/16 0506  PROT 6.3*  ALBUMIN 1.9*  AST 30  ALT 17  ALKPHOS 247*  BILITOT 0.8   PT/INR  Recent Labs  02/09/16 0506  LABPROT 24.2*  INR 2.14   Studies/Results: Ct Abdomen Pelvis W Contrast  Result Date: 02/09/2016 CLINICAL DATA:  Diffuse abdominal pain. Abdominal distention. History of colon cancer. Elevated lipase. EXAM: CT ABDOMEN AND PELVIS WITH CONTRAST TECHNIQUE: Multidetector CT imaging of the abdomen and pelvis was performed using the standard protocol following bolus administration of intravenous contrast. CONTRAST:  143mL ISOVUE-300 IOPAMIDOL (ISOVUE-300) INJECTION 61% COMPARISON:  CT abdomen pelvis - 01/16/2016 FINDINGS: Lower chest: Re- demonstrated bilateral pulmonary nodules, the largest of which within the left lower lobe measures 0.9 cm in diameter (image 42, series 4). Minimal subsegmental atelectasis within the bilateral lower lobes. No pleural effusion. Normal heart size.  No pericardial effusion. Hepatobiliary: Normal hepatic contour. Re- demonstrated  innumerable hypo attenuating hepatic metastases, progressed in short interval since the 01/2013 examination with dominant mass within subcapsular aspect the posterior segment of the right lobe of the liver now measuring approximately 5.7 x 4.7 cm (image 22, series 2), previously, 4.4 x 3.5 cm, dominant mass within the posterior subcapsular aspect of the lateral segment of the left lobe of the liver now measuring approximately 4.5 x 4.6 cm (image 34, series 2), previously, 3.6 x 3.4 cm and dominant mass within the central aspect of the right lobe of liver now measuring 5.5 x 4.5 cm (image 20, series 2), previously, 3.8 x 3.2 cm. There is a small amounts of intra-abdominal ascites within the bilateral pericolic gutters. The portal vein remains patent. Normal appearance of the gallbladder given degree distention. No radiopaque gallstones. No intra extrahepatic biliary duct dilatation. Pancreas: Previously questioned peripancreatic stranding is less conspicuous on the present examination. There is homogeneous enhancement of the pancreatic parenchyma. No definitive pancreatic ductal dilatation. No discrete pancreatic mass on this non pancreatic protocol CT scan. Spleen: Surgically absent. A splenule is noted within the left upper abdominal quadrant. Adrenals/Urinary Tract: There is homogeneous enhancement and excretion of the bilateral kidneys. Punctate (approximately 0.8 cm) nonobstructing stone within the inferior pole of the left kidney as well as an additional punctate (approximately 2 mm) nonobstructing stone within the superior pole the left kidney. No definite right-sided renal stones. Note is again made of mild external rotation of the right kidney. No urinary obstruction or perinephric stranding. Normal appearance of the urinary bladder given degree distention. Stomach/Bowel: Ingested enteric contrast extends to the level of the distal small bowel. Post left hemicolectomy with additional enteric suture line  located within the mid upper abdomen. Exophytic soft tissue mass adjacent to the posterior aspect of  the surgical anastomosis is grossly unchanged measuring approximately 4.3 x 2.0 cm (coronal image 109, series 5), as is an adjacent exophytic soft tissue mass about the anterior lateral aspect of the anastomosis measuring approximately 2.1 x 3.3 cm (axial image 73, series 2), however there is now apparent malignant obstruction of the distal anastomosis with upstream distention of the colon. There is diffuse wall thickening involving the colon upstream from the anastomosis. No pneumoperitoneum, pneumatosis or portal venous gas. Normal appearance of the terminal ileum and appendix. There is a small amount of fluid seen throughout the abdomen without definable/drainable fluid collection. Vascular/Lymphatic: Minimal amount of atherosclerotic plaque within a normal caliber abdominal aorta. The major branch vessels of the abdominal aorta appear patent on this non CTA examination. No bulky retroperitoneal, mesenteric, pelvic or inguinal lymphadenopathy. Reproductive: Post hysterectomy. No discrete adnexal lesion. There is a small amount free fluid in the pelvic cul-de-sac. Other: Well-healed midline abdominal incision. Mild diffuse body wall anasarca, most conspicuous about the lateral aspects of the bilateral thighs, left greater than right. Musculoskeletal: No acute or aggressive osseous abnormalities. An intraosseous hemangioma is noted within the T12 vertebral body. Mild-to-moderate multilevel lumbar spine DDD, worse at L4-L5 with disc space height loss, endplate irregularity and sclerosis. IMPRESSION: 1. Post left hemicolectomy with concern for development of malignant obstruction involving the distal colonic anastomosis with wall thickening involving the upstream colon. No evidence of perforation or definable/drainable fluid collection. 2. Progression of hepatic metastatic disease, even compared with recent prior  examination performed 01/16/2016 - reference mass within the right lobe of liver now measures 5.5 cm, previously, 3.8 cm. 3. Decreased conspicuity of previously questioned peripancreatic and duodenal stranding. 4. Grossly unchanged pulmonary metastatic disease within the imaged lung bases. Electronically Signed   By: Sandi Mariscal M.D.   On: 02/09/2016 08:06   Dg Chest Port 1 View  Result Date: 02/09/2016 CLINICAL DATA:  65 year old female with shortness of breath on exertion. Initial encounter. Current history of metastatic colon cancer. EXAM: PORTABLE CHEST 1 VIEW COMPARISON:  Chest radiographs 10/25/2015 and earlier. FINDINGS: Portable AP semi upright view at 0935 hours. Stable right chest porta cath. Lower lung volumes with increased elevation of the diaphragm. Associated linear and platelike atelectasis at both lung bases. No pneumothorax pulmonary edema. No pleural effusion evident. Stable cardiac size and mediastinal contours. Suggestion of a new 9 mm right upper lobe lung nodule since April (arrow). However, this is well-circumscribed for its size and might be artifact. No other acute pulmonary finding. Paucity of bowel gas in the visualized upper abdomen. IMPRESSION: 1. Possible metastatic right upper lobe pulmonary nodule new since April, but might be artifact (such as something external or on the skin surface). Chest CT (IV contrast preferred) would best evaluate further. 2. Otherwise low lung volumes today with lung base atelectasis. Electronically Signed   By: Genevie Ann M.D.   On: 02/09/2016 10:05   Dg Abd Portable 1v  Result Date: 02/09/2016 CLINICAL DATA:  RIGHT lower quadrant abdominal pain, nausea, vomiting and diarrhea, history colon cancer EXAM: PORTABLE ABDOMEN - 1 VIEW COMPARISON:  CT abdomen and pelvis 02/09/2016 FINDINGS: GI contrast within nondistended small bowel loops in the RIGHT mid abdomen. Gaseous distention of transverse colon. Excreted contrast material within renal collecting  systems, ureters, and bladder. Dilatation of LEFT renal collecting system and LEFT ureter. Bones unremarkable. IMPRESSION: Gaseous distention of transverse colon without small bowel dilatation. LEFT hydronephrosis and hydroureter. Electronically Signed   By: Crist Infante.D.  On: 02/09/2016 08:41   IMPRESSION:  *65 year old female with colon obstruction from malignant appearing colonic stricture from recurrent stage IV colon cancer (mets to liver, lung, brain).  She is status post left hemicolectomy, status post colostomy reversal (had issues with prolapse of her colostomy).  Refusing another colostomy.  Has not received chemo/treatment since 2016.  Had colonic stents placed previously. -Recurrent anemia:  Likely related to chronic disease/malignancy and possibly some low-grade blood loss from that as well.  Hgb improved and stable s/p 2 units PRBC's. -History of PE:  On Xarelto, but on hold currently.  PLAN: -Dr. Carlean Purl to review and see if she is a candidate for stent placement.  ZEHR, JESSICA D.  02/10/2016, 9:01 AM  Pager number [facility] Attending   I have taken an interval history, reviewed the chart and examined the patient. I agree with the Advanced Practitioner's note, impression and recommendations.   I have reviewed her CT scan - last abd/pelvis study  Complicated situation in this unfortunate woman with progressive st IV colon cancer.  2 main issues re: possible colonic stent  1) technically possible 2) Is she coagulopathic and even if not intrinsically so from liver tumor burden would xarelto use lead to problems with bleeding from stent. 3) I also have some concern about diffusely metastatic disease including brain mets and time left - she is not in distress fron colon obstruction or at least much at this time.   Will start with recheck of coags and revisit tomorrow. I have explaine my thoughts and plans to her and daughter.  Gatha Mayer, MD,  [facility] [phone] (pager) [phone] after 5 PM, weekends and holidays  02/10/2016 8:18 PM

## 2016-02-10 NOTE — Progress Notes (Addendum)
Subjective: No real change, she did have some stool and said it was more formed.  Not much in the way of bowel sounds.  Still very adamant about not having colostomy.  GI to see today.    Objective: Vital signs in last 24 hours: Temp:  [97.7 F (36.5 C)-98.7 F (37.1 C)] 98.4 F (36.9 C) (08/08 [id number]) Pulse Rate:  [97-117] 100 (08/08 0633) Resp:  [16-20] 18 (08/08 0633) BP: (112-132)/(50-68) 115/52 (08/08 0633) SpO2:  [100 %] 100 % (08/08 [id number]) Weight:  [66 kg (145 lb 8.1 oz)-69.4 kg (153 lb)] 69.4 kg (153 lb) (08/08 0436) Last BM Date: 02/09/16 NPO BM x 3 Urine 200 recorded Afebrile, VSS K+ 3.1 WBC is still up, H/H is better. Intake/Output from previous day: 08/07 0701 - 08/08 0700 In: 802.8 [I.V.:118.8; Blood:684] Out: 200 [Urine:200] Intake/Output this shift: No intake/output data recorded.  General appearance: alert, cooperative and no distress GI: soft, non tender, not very distended.  Warm pack to abdomen, but not in any distress.  BS hypoacitive,  + Bm reported.  Lab Results:   Recent Labs  02/09/16 0506 02/09/16 2157 02/10/16 0320  WBC 15.0*  --  15.9*  HGB 6.8* 8.5* 8.1*  HCT 21.3* 25.9* 25.4*  PLT 730*  --  686*    BMET  Recent Labs  02/09/16 0506 02/10/16 0320  NA 133* 138  K 4.0 3.1*  CL 101 107  CO2 22 22  GLUCOSE 96 67  BUN 8 6  CREATININE 0.39* 0.38*  CALCIUM 9.4 8.9   PT/INR  Recent Labs  02/09/16 0506  LABPROT 24.2*  INR 2.14     Recent Labs Lab 02/09/16 0506  AST 30  ALT 17  ALKPHOS 247*  BILITOT 0.8  PROT 6.3*  ALBUMIN 1.9*     Lipase     Component Value Date/Time   LIPASE 71 (H) 02/09/2016 0506     Studies/Results: Ct Abdomen Pelvis W Contrast  Result Date: 02/09/2016 CLINICAL DATA:  Diffuse abdominal pain. Abdominal distention. History of colon cancer. Elevated lipase. EXAM: CT ABDOMEN AND PELVIS WITH CONTRAST TECHNIQUE: Multidetector CT imaging of the abdomen and pelvis was performed using the standard  protocol following bolus administration of intravenous contrast. CONTRAST:  139mL ISOVUE-300 IOPAMIDOL (ISOVUE-300) INJECTION 61% COMPARISON:  CT abdomen pelvis - 01/16/2016 FINDINGS: Lower chest: Re- demonstrated bilateral pulmonary nodules, the largest of which within the left lower lobe measures 0.9 cm in diameter (image 42, series 4). Minimal subsegmental atelectasis within the bilateral lower lobes. No pleural effusion. Normal heart size.  No pericardial effusion. Hepatobiliary: Normal hepatic contour. Re- demonstrated innumerable hypo attenuating hepatic metastases, progressed in short interval since the 01/2013 examination with dominant mass within subcapsular aspect the posterior segment of the right lobe of the liver now measuring approximately 5.7 x 4.7 cm (image 22, series 2), previously, 4.4 x 3.5 cm, dominant mass within the posterior subcapsular aspect of the lateral segment of the left lobe of the liver now measuring approximately 4.5 x 4.6 cm (image 34, series 2), previously, 3.6 x 3.4 cm and dominant mass within the central aspect of the right lobe of liver now measuring 5.5 x 4.5 cm (image 20, series 2), previously, 3.8 x 3.2 cm. There is a small amounts of intra-abdominal ascites within the bilateral pericolic gutters. The portal vein remains patent. Normal appearance of the gallbladder given degree distention. No radiopaque gallstones. No intra extrahepatic biliary duct dilatation. Pancreas: Previously questioned peripancreatic stranding is less conspicuous on the present  examination. There is homogeneous enhancement of the pancreatic parenchyma. No definitive pancreatic ductal dilatation. No discrete pancreatic mass on this non pancreatic protocol CT scan. Spleen: Surgically absent. A splenule is noted within the left upper abdominal quadrant. Adrenals/Urinary Tract: There is homogeneous enhancement and excretion of the bilateral kidneys. Punctate (approximately 0.8 cm) nonobstructing stone  within the inferior pole of the left kidney as well as an additional punctate (approximately 2 mm) nonobstructing stone within the superior pole the left kidney. No definite right-sided renal stones. Note is again made of mild external rotation of the right kidney. No urinary obstruction or perinephric stranding. Normal appearance of the urinary bladder given degree distention. Stomach/Bowel: Ingested enteric contrast extends to the level of the distal small bowel. Post left hemicolectomy with additional enteric suture line located within the mid upper abdomen. Exophytic soft tissue mass adjacent to the posterior aspect of the surgical anastomosis is grossly unchanged measuring approximately 4.3 x 2.0 cm (coronal image 109, series 5), as is an adjacent exophytic soft tissue mass about the anterior lateral aspect of the anastomosis measuring approximately 2.1 x 3.3 cm (axial image 73, series 2), however there is now apparent malignant obstruction of the distal anastomosis with upstream distention of the colon. There is diffuse wall thickening involving the colon upstream from the anastomosis. No pneumoperitoneum, pneumatosis or portal venous gas. Normal appearance of the terminal ileum and appendix. There is a small amount of fluid seen throughout the abdomen without definable/drainable fluid collection. Vascular/Lymphatic: Minimal amount of atherosclerotic plaque within a normal caliber abdominal aorta. The major branch vessels of the abdominal aorta appear patent on this non CTA examination. No bulky retroperitoneal, mesenteric, pelvic or inguinal lymphadenopathy. Reproductive: Post hysterectomy. No discrete adnexal lesion. There is a small amount free fluid in the pelvic cul-de-sac. Other: Well-healed midline abdominal incision. Mild diffuse body wall anasarca, most conspicuous about the lateral aspects of the bilateral thighs, left greater than right. Musculoskeletal: No acute or aggressive osseous abnormalities.  An intraosseous hemangioma is noted within the T12 vertebral body. Mild-to-moderate multilevel lumbar spine DDD, worse at L4-L5 with disc space height loss, endplate irregularity and sclerosis. IMPRESSION: 1. Post left hemicolectomy with concern for development of malignant obstruction involving the distal colonic anastomosis with wall thickening involving the upstream colon. No evidence of perforation or definable/drainable fluid collection. 2. Progression of hepatic metastatic disease, even compared with recent prior examination performed 01/16/2016 - reference mass within the right lobe of liver now measures 5.5 cm, previously, 3.8 cm. 3. Decreased conspicuity of previously questioned peripancreatic and duodenal stranding. 4. Grossly unchanged pulmonary metastatic disease within the imaged lung bases. Electronically Signed   By: Sandi Mariscal M.D.   On: 02/09/2016 08:06   Dg Chest Port 1 View  Result Date: 02/09/2016 CLINICAL DATA:  65 year old female with shortness of breath on exertion. Initial encounter. Current history of metastatic colon cancer. EXAM: PORTABLE CHEST 1 VIEW COMPARISON:  Chest radiographs 10/25/2015 and earlier. FINDINGS: Portable AP semi upright view at 0935 hours. Stable right chest porta cath. Lower lung volumes with increased elevation of the diaphragm. Associated linear and platelike atelectasis at both lung bases. No pneumothorax pulmonary edema. No pleural effusion evident. Stable cardiac size and mediastinal contours. Suggestion of a new 9 mm right upper lobe lung nodule since April (arrow). However, this is well-circumscribed for its size and might be artifact. No other acute pulmonary finding. Paucity of bowel gas in the visualized upper abdomen. IMPRESSION: 1. Possible metastatic right upper lobe pulmonary nodule  new since April, but might be artifact (such as something external or on the skin surface). Chest CT (IV contrast preferred) would best evaluate further. 2. Otherwise low  lung volumes today with lung base atelectasis. Electronically Signed   By: Genevie Ann M.D.   On: 02/09/2016 10:05   Dg Abd Portable 1v  Result Date: 02/09/2016 CLINICAL DATA:  RIGHT lower quadrant abdominal pain, nausea, vomiting and diarrhea, history colon cancer EXAM: PORTABLE ABDOMEN - 1 VIEW COMPARISON:  CT abdomen and pelvis 02/09/2016 FINDINGS: GI contrast within nondistended small bowel loops in the RIGHT mid abdomen. Gaseous distention of transverse colon. Excreted contrast material within renal collecting systems, ureters, and bladder. Dilatation of LEFT renal collecting system and LEFT ureter. Bones unremarkable. IMPRESSION: Gaseous distention of transverse colon without small bowel dilatation. LEFT hydronephrosis and hydroureter. Electronically Signed   By: Lavonia Dana M.D.   On: 02/09/2016 08:41   Prior to Admission medications   Medication Sig Start Date End Date Taking? Authorizing Provider  acetaminophen (TYLENOL) 500 MG tablet Take 500 mg by mouth every 6 (six) hours as needed for moderate pain or headache.    Yes Historical Provider, MD  HYDROmorphone (DILAUDID) 4 MG tablet Take 1 tablet (4 mg total) by mouth every 4 (four) hours as needed for severe pain. 01/05/16  Yes Hayden Pedro, PA-C  IRON PO Take 1 tablet by mouth every morning.   Yes Historical Provider, MD  polyethylene glycol powder (GLYCOLAX/MIRALAX) powder TAKE 34 G (2 DOSES) BY MOUTH DAILY AS NEEDED FOR MILD CONSTIPATION OR MODERATE CONSTIPATION 03/03/14  Yes Concha Norway, MD  vitamin B-12 (CYANOCOBALAMIN) 500 MCG tablet Take 500 mcg by mouth daily.   Yes Historical Provider, MD  XARELTO 20 MG TABS tablet TAKE 1 TABLET BY MOUTH EVERY DAY WITH SUPPER 01/14/16  Yes Truitt Merle, MD  dexamethasone (DECADRON) 2 MG tablet Take 2mg  BID for 1 week, then 2 mg daily for 1 week, then 2mg  every other day for one week then stop Patient not taking: Reported on 02/09/2016 12/17/15   Kyung Rudd, MD  fluconazole (DIFLUCAN) 100 MG tablet Take 1  tablet (100 mg total) by mouth daily. Patient not taking: Reported on 02/09/2016 01/20/16   Susanne Borders, NP  magic mouthwash w/lidocaine SOLN Take 5 mLs by mouth 4 (four) times daily as needed for mouth pain. Patient not taking: Reported on 02/09/2016 01/20/16   Susanne Borders, NP  pantoprazole (PROTONIX) 40 MG tablet Take 1 tablet (40 mg total) by mouth daily. Patient not taking: Reported on 01/20/2016 12/02/15   Lavon Paganini Angiulli, PA-C    Medications: . ferrous sulfate  325 mg Oral Q breakfast  . pantoprazole  40 mg Oral Daily  . sodium chloride flush  10-40 mL Intracatheter Q12H  . sodium chloride flush  3 mL Intravenous Q12H  . vitamin B-12  500 mcg Oral Daily   . sodium chloride 75 mL/hr at 02/09/16 1725    Assessment/Plan Colon Obstruction with 4.3 x 2 cm and 2.1 x 3.3 cm exophytic soft tissue masses Recurrent stage IV colon cancer Status post left hemicolectomy, status post colostomy reversal Status post splenectomy Regression of hepatic metastatic disease compared with 01/16/16. Unchanged pulmonary metastatic disease Anemia with transfusion 02/09/16   FEN: DVT: SCD only  ID:  none   Plan:  Will follow with you, nothing new to add at this point.       LOS: 0 days    JENNINGS,WILLARD 02/10/2016 [phone]  Agree with  above. There is not much to add from a surgery point of view. It is very difficult how well she understands what is going on. Being seen by GI and Dr. Carlean Purl for possible stent placement. She was also seen by palliative care.  She wants some ice cream, liquids.  That is okay.  It won't hurt her, though she may get nauseated.   Alphonsa Overall, MD, [facility] Surgery Pager: [phone] Office phone:  [phone]

## 2016-02-11 DIAGNOSIS — D649 Anemia, unspecified: Secondary | ICD-10-CM

## 2016-02-11 DIAGNOSIS — Z515 Encounter for palliative care: Secondary | ICD-10-CM

## 2016-02-11 DIAGNOSIS — I2699 Other pulmonary embolism without acute cor pulmonale: Secondary | ICD-10-CM

## 2016-02-11 LAB — CBC
HCT: 25.9 % — ABNORMAL LOW (ref 36.0–46.0)
Hemoglobin: 8.4 g/dL — ABNORMAL LOW (ref 12.0–15.0)
MCH: 25.5 pg — ABNORMAL LOW (ref 26.0–34.0)
MCHC: 32.4 g/dL (ref 30.0–36.0)
MCV: 78.5 fL (ref 78.0–100.0)
PLATELETS: 520 10*3/uL — AB (ref 150–400)
RBC: 3.3 MIL/uL — ABNORMAL LOW (ref 3.87–5.11)
RDW: 20.9 % — AB (ref 11.5–15.5)
WBC: 14.5 10*3/uL — ABNORMAL HIGH (ref 4.0–10.5)

## 2016-02-11 LAB — BASIC METABOLIC PANEL
ANION GAP: 9 (ref 5–15)
BUN: 5 mg/dL — AB (ref 6–20)
CALCIUM: 9 mg/dL (ref 8.9–10.3)
CO2: 19 mmol/L — ABNORMAL LOW (ref 22–32)
CREATININE: 0.39 mg/dL — AB (ref 0.44–1.00)
Chloride: 109 mmol/L (ref 101–111)
GFR calc Af Amer: >60 mL/min (ref >60)
GLUCOSE: 105 mg/dL — AB (ref 65–99)
Potassium: 3.9 mmol/L (ref 3.5–5.1)
Sodium: 137 mmol/L (ref 135–145)

## 2016-02-11 LAB — APTT: APTT: 32 s (ref 24–36)

## 2016-02-11 LAB — PROTIME-INR
INR: 1.31
PROTHROMBIN TIME: 16.4 s — AB (ref 11.4–15.2)

## 2016-02-11 MED ORDER — SODIUM CHLORIDE 0.9 % IV SOLN: INTRAVENOUS | Status: DC

## 2016-02-11 MED ORDER — ALTEPLASE 2 MG IJ SOLR
2.0000 mg | Freq: Once | INTRAMUSCULAR | Status: AC
  Administered 2016-02-11: 2 mg
  Filled 2016-02-11: qty 2

## 2016-02-11 NOTE — Progress Notes (Signed)
   Patient Name: [name] [name] Date of Encounter: 02/11/2016, 1:39 PM    Subjective  Tolerating full liquids Passing gas Some loose stools Denies sig pain   Objective  BP (!) 108/54 (BP Location: Left Arm)   Pulse (!) 111   Temp 98.9 F (37.2 C) (Oral)   Resp 18   Ht 5' (1.524 m)   Wt 149 lb 0.5 oz (67.6 kg)   SpO2 100%   BMI 29.11 kg/m   NAD  Lab Results  Component Value Date   INR 1.31 02/11/2016   INR 2.14 02/09/2016   INR 1.14 10/25/2015   CBC Latest Ref Rng & Units 02/11/2016 02/10/2016 02/09/2016  WBC 4.0 - 10.5 K/uL 14.5(H) 15.9(H) -  Hemoglobin 12.0 - 15.0 g/dL 8.4(L) 8.1(L) 8.5(L)  Hematocrit 36.0 - 46.0 % 25.9(L) 25.4(L) 25.9(L)  Platelets 150 - 400 K/uL 520(H) 686(H) -      Assessment and Plan  Metastatic colon cancer with colonic stricture and dilated proximal colon. Moderate sxs.  Plan for attempt at colonic stent tomorrow.  We reviewed risks of perforation, death, bleeding, infection and pain as well as usual risks of endoscopy. She wants to take this step in an  to prevent complete bowel obstruction. She is comfortable with idea that it could make her worse though not likely to do so.  Use of Xarelto in future is up in the air - could exacerbate bleeding. Will decide after stent is in.  Gatha Mayer, MD, [facility] Gastroenterology [phone] (pager) [phone] after 5 PM, weekends and holidays  02/11/2016 1:43 PM   Gatha Mayer, MD, [facility] Gastroenterology [phone] (pager) [phone] after 5 PM, weekends and holidays  02/11/2016 1:39 PM

## 2016-02-11 NOTE — Progress Notes (Signed)
KORBYN KOZLOFF   DOB:03/18/1951   [id number]   [id number]   Oncology follow up note   Subjective: Chaniya feels better overall, her abdominal pain has much improved, she is on liquid to soft diet now tolerating well. She is scheduled to have chronic stent placement  by Dr. Carlean Purl tomorrow.  Objective:  Vitals:   02/11/16 0538 02/11/16 1440  BP: (!) 108/54 106/66  Pulse: (!) 111 (!) 117  Resp: 18 20  Temp: 98.9 F (37.2 C) 98.5 F (36.9 C)    Body mass index is 29.11 kg/m.  Intake/Output Summary (Last 24 hours) at 02/11/16 2007 Last data filed at 02/11/16 1600  Gross per 24 hour  Intake             1440 ml  Output                3 ml  Net             1437 ml     Sclerae unicteric  Oropharynx clear, a few small mucosal ulcers in her in the lower lips  No peripheral adenopathy  Lungs clear -- no rales or rhonchi  Heart regular rate and rhythm  Abdomen soft with mild tenderness in the left side abdomen  MSK no focal spinal tenderness, (+) bilateral low extremity edema  Neuro (+) bilateral leg weakness 4/5     CBG (last 3)  No results for input(s): GLUCAP in the last 72 hours.   Labs:  Lab Results  Component Value Date   WBC 14.5 (H) 02/11/2016   HGB 8.4 (L) 02/11/2016   HCT 25.9 (L) 02/11/2016   MCV 78.5 02/11/2016   PLT 520 (H) 02/11/2016   NEUTROABS 13.0 (H) 02/09/2016    Urine Studies No results for input(s): UHGB, CRYS in the last 72 hours.  Invalid input(s): UACOL, UAPR, USPG, UPH, UTP, UGL, UKET, UBIL, UNIT, UROB, ULEU, UEPI, UWBC, URBC, UBAC, CAST, [place], [place]  Basic Metabolic Panel:  Recent Labs Lab 02/09/16 0506 02/10/16 0320 02/11/16 0542  NA 133* 138 137  K 4.0 3.1* 3.9  CL 101 107 109  CO2 22 22 19*  GLUCOSE 96 67 105*  BUN 8 6 5*  CREATININE 0.39* 0.38* 0.39*  CALCIUM 9.4 8.9 9.0   GFR Estimated Creatinine Clearance: 61.7 mL/min (by C-G formula based on SCr of 0.8 mg/dL). Liver Function Tests:  Recent Labs Lab  02/09/16 0506  AST 30  ALT 17  ALKPHOS 247*  BILITOT 0.8  PROT 6.3*  ALBUMIN 1.9*    Recent Labs Lab 02/09/16 0506  LIPASE 71*   No results for input(s): AMMONIA in the last 168 hours. Coagulation profile  Recent Labs Lab 02/09/16 0506 02/11/16 0542  INR 2.14 1.31    CBC:  Recent Labs Lab 02/09/16 0506 02/09/16 2157 02/10/16 0320 02/11/16 0542  WBC 15.0*  --  15.9* 14.5*  NEUTROABS 13.0*  --   --   --   HGB 6.8* 8.5* 8.1* 8.4*  HCT 21.3* 25.9* 25.4* 25.9*  MCV 76.9*  --  78.4 78.5  PLT 730*  --  686* 520*   Cardiac Enzymes: No results for input(s): CKTOTAL, CKMB, CKMBINDEX, TROPONINI in the last 168 hours. BNP: Invalid input(s): POCBNP CBG: No results for input(s): GLUCAP in the last 168 hours. D-Dimer No results for input(s): DDIMER in the last 72 hours. Hgb A1c No results for input(s): HGBA1C in the last 72 hours. Lipid Profile No results for input(s): CHOL, HDL, LDLCALC,  TRIG, CHOLHDL, LDLDIRECT in the last 72 hours. Thyroid function studies No results for input(s): TSH, T4TOTAL, T3FREE, THYROIDAB in the last 72 hours.  Invalid input(s): FREET3 Anemia work up  Recent Labs  02/09/16 0802  VITAMINB12 >7,500*  FOLATE 5.1*  FERRITIN 179  TIBC 144*  IRON 6*  RETICCTPCT 1.6   Microbiology No results found for this or any previous visit (from the past 240 hour(s)).    Studies:  No results found.  Assessment: 65 y.o.   1. Colonic bowel obstruction secondary to cancer recurrence at anastomosis  2. Metastatic colon cancer, currently off chemo, on observation, per pt's choice  3. History of PE, on Xaralto (on hold now) 4. 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 Anasarca 6. Severe protein malnutrition  Plan:  -Appreciate surgical and GI input, she is scheduled for chronic stent placement tomorrow -Regarding her anticoagulation after stent placement: I will favor to continue anticoagulation unless there is  clinically significant bleeding, especially from the colon mass. She is at a very high risk for recurrent thrombosis due to her underlying malignancy and immobility. If there is very mild bleeding from the tumor after stent placement, we can low her Xarelto dose to 15 mg daily. Patient refuses Coumadin -The goal of therapy is palliation.  I will follow up, please call me for questions.    Truitt Merle, MD 02/11/2016  8:07 PM

## 2016-02-11 NOTE — Progress Notes (Signed)
PROGRESS NOTE    [name] [name]  [id number] DOB: [birth year] DOA: 02/09/2016  PCP: Kristine Garbe, MD   Brief Narrative:  64 y/o female with metastatic colon cancer who is currently on observation presents with abdominal pain, nausea and vomiting. CT suggests a possible malignant obstruction in distal colon.   Subjective: Eating without difficulty. Having BMs. No vomiting or abdominal pain.   Assessment & Plan:   Active Problems:   Colorectal cancer, stage IV, obstructing s/p stent x2, ostomy, then LAR 03/06/2015 - surgery and GI evaluting and discussing best way to relieve her obstruction- she does not want a diverting colostomy - Dr Carlean Purl has decided along with the patient that he will attempt to place a stent tomorrow- unfortunately, she will remain a high risk for bleeding if the stent causes erosion into the bowel wall- anticoagulation may need to be discontinued in the long run if the stent causes this complication    Pulmonary embolism in 2016 - holding anticoagulation for now - may need to d/c if GI bleed continues     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  - not on chemo at this time- palliative care has been suggested by her oncologist but she has declined it for now  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 - on Zometa    Symptomatic anemia - possibly due to slow chronic GI bleed from cancer - transfused 2 U PRBC    GI bleed - hemo occult positive likely due to cancer   severe protein calorie malnutrition and b/l pedal edema - due to underlying illness   DVT prophylaxis: SCDs Code Status: Full code Family Communication:  Disposition Plan: home when stable Consultants:   GI  gen surgery   oncology Procedures:   none Antimicrobials:  Anti-infectives    None       Objective: Vitals:   02/10/16 1100 02/10/16 1348 02/10/16 2103 02/11/16 0538  BP: 119/60 (!) 120/54 (!) 111/56 (!) 108/54  Pulse: (!) 104 (!) 110 (!) 123 (!) 111  Resp:  16 17 18     Temp:  98.4 F (36.9 C) 99 F (37.2 C) 98.9 F (37.2 C)  TempSrc:  Oral Oral Oral  SpO2:  98% 100% 100%  Weight:    67.6 kg (149 lb 0.5 oz)  Height:        Intake/Output Summary (Last 24 hours) at 02/11/16 1159 Last data filed at 02/11/16 0700  Gross per 24 hour  Intake             2310 ml  Output                1 ml  Net             2309 ml   Filed Weights   02/09/16 2120 02/10/16 0436 02/11/16 0538  Weight: 66 kg (145 lb 8.1 oz) 69.4 kg (153 lb) 67.6 kg (149 lb 0.5 oz)    Examination: General exam: Appears comfortable  HEENT: PERRLA, oral mucosa moist, no sclera icterus or thrush Respiratory system: Clear to auscultation. Respiratory effort normal. Cardiovascular system: S1 & S2 heard, RRR.  No murmurs  Gastrointestinal system: Abdomen soft, non-tender, nondistended. Normal bowel sound. No organomegaly Central nervous system: Alert and oriented. No focal neurological deficits. Extremities: No cyanosis, clubbing or edema Skin: No rashes or ulcers Psychiatry:  Mood & affect appropriate.     Data Reviewed: I have personally reviewed following labs and imaging studies  CBC:  Recent Labs Lab 02/09/16 0506 02/09/16  2157 02/10/16 0320 02/11/16 0542  WBC 15.0*  --  15.9* 14.5*  NEUTROABS 13.0*  --   --   --   HGB 6.8* 8.5* 8.1* 8.4*  HCT 21.3* 25.9* 25.4* 25.9*  MCV 76.9*  --  78.4 78.5  PLT 730*  --  686* [id number]*   Basic Metabolic Panel:  Recent Labs Lab 02/09/16 0506 02/10/16 0320 02/11/16 0542  NA 133* 138 137  K 4.0 3.1* 3.9  CL 101 107 109  CO2 22 22 19*  GLUCOSE 96 67 105*  BUN 8 6 5*  CREATININE 0.39* 0.38* 0.39*  CALCIUM 9.4 8.9 9.0   GFR: Estimated Creatinine Clearance: 61.7 mL/min (by C-G formula based on SCr of 0.8 mg/dL). Liver Function Tests:  Recent Labs Lab 02/09/16 0506  AST 30  ALT 17  ALKPHOS 247*  BILITOT 0.8  PROT 6.3*  ALBUMIN 1.9*    Recent Labs Lab 02/09/16 0506  LIPASE 71*   No results for input(s): AMMONIA in  the last 168 hours. Coagulation Profile:  Recent Labs Lab 02/09/16 0506 02/11/16 0542  INR 2.14 1.31   Cardiac Enzymes: No results for input(s): CKTOTAL, CKMB, CKMBINDEX, TROPONINI in the last 168 hours. BNP (last 3 results) No results for input(s): PROBNP in the last 8760 hours. HbA1C: No results for input(s): HGBA1C in the last 72 hours. CBG: No results for input(s): GLUCAP in the last 168 hours. Lipid Profile: No results for input(s): CHOL, HDL, LDLCALC, TRIG, CHOLHDL, LDLDIRECT in the last 72 hours. Thyroid Function Tests: No results for input(s): TSH, T4TOTAL, FREET4, T3FREE, THYROIDAB in the last 72 hours. Anemia Panel:  Recent Labs  02/09/16 0802  VITAMINB12 >7,500*  FOLATE 5.1*  FERRITIN 179  TIBC 144*  IRON 6*  RETICCTPCT 1.6   Urine analysis:    Component Value Date/Time   COLORURINE YELLOW 02/09/2016 [place] 02/09/2016 0937   LABSPEC >1.046 (H) 02/09/2016 0937   LABSPEC 1.020 12/15/2015 1430   PHURINE 6.5 02/09/2016 0937   GLUCOSEU NEGATIVE 02/09/2016 0937   GLUCOSEU Negative 12/15/2015 1430   HGBUR NEGATIVE 02/09/2016 0937   BILIRUBINUR NEGATIVE 02/09/2016 0937   BILIRUBINUR Negative 12/15/2015 1430   KETONESUR NEGATIVE 02/09/2016 0937   PROTEINUR NEGATIVE 02/09/2016 0937   UROBILINOGEN 0.2 12/15/2015 1430   NITRITE NEGATIVE 02/09/2016 0937   LEUKOCYTESUR NEGATIVE 02/09/2016 0937   LEUKOCYTESUR Trace 12/15/2015 1430   Sepsis Labs: @LABRCNTIP (procalcitonin:4,lacticidven:4) )No results found for this or any previous visit (from the past 240 hour(s)).       Radiology Studies: No results found.    Scheduled Meds: . ferrous sulfate  325 mg Oral Q breakfast  . magic mouthwash  5 mL Oral QID  . pantoprazole  40 mg Oral Daily  . sodium chloride flush  10-40 mL Intracatheter Q12H  . sodium chloride flush  3 mL Intravenous Q12H  . vitamin B-12  500 mcg Oral Daily   Continuous Infusions: . sodium chloride 100 mL/hr at  02/10/16 1553     LOS: 1 day    Time spent in minutes: 2    [place], MD [facility] Pager: [email or web address] Password [email or web address] 02/11/2016, 11:59 AM

## 2016-02-12 ENCOUNTER — Inpatient Hospital Stay (HOSPITAL_COMMUNITY): Payer: Medicare Other

## 2016-02-12 ENCOUNTER — Inpatient Hospital Stay (HOSPITAL_COMMUNITY): Payer: Medicare Other | Admitting: Certified Registered"

## 2016-02-12 ENCOUNTER — Encounter (HOSPITAL_COMMUNITY)
Admission: EM | Disposition: A | Discharge status: Hospice | Payer: Self-pay | Source: Home / Self Care | Attending: Internal Medicine

## 2016-02-12 ENCOUNTER — Encounter (HOSPITAL_COMMUNITY): Payer: Self-pay

## 2016-02-12 HISTORY — PX: FLEXIBLE SIGMOIDOSCOPY: SHX5431

## 2016-02-12 SURGERY — SIGMOIDOSCOPY, FLEXIBLE
Anesthesia: Moderate Sedation

## 2016-02-12 MED ORDER — PROPOFOL 10 MG/ML IV BOLUS
INTRAVENOUS | Status: DC | PRN
  Administered 2016-02-12 (×2): 50 mg via INTRAVENOUS
  Administered 2016-02-12: 40 mg via INTRAVENOUS
  Administered 2016-02-12: 80 mg via INTRAVENOUS
  Administered 2016-02-12: 60 mg via INTRAVENOUS
  Administered 2016-02-12: 20 mg via INTRAVENOUS
  Administered 2016-02-12: 50 mg via INTRAVENOUS
  Administered 2016-02-12: 20 mg via INTRAVENOUS

## 2016-02-12 MED ORDER — FENTANYL CITRATE (PF) 100 MCG/2ML IJ SOLN
INTRAMUSCULAR | Status: AC
  Filled 2016-02-12: qty 2

## 2016-02-12 MED ORDER — PROPOFOL 10 MG/ML IV BOLUS
INTRAVENOUS | Status: AC
  Filled 2016-02-12: qty 20

## 2016-02-12 MED ORDER — FENTANYL CITRATE (PF) 100 MCG/2ML IJ SOLN
25.0000 ug | Freq: Once | INTRAMUSCULAR | Status: AC
  Administered 2016-02-12: 25 ug via INTRAVENOUS

## 2016-02-12 MED ORDER — LIDOCAINE HCL (PF) 2 % IJ SOLN: INTRAMUSCULAR | Status: DC | PRN

## 2016-02-12 SURGICAL SUPPLY — 22 items

## 2016-02-12 NOTE — Anesthesia Postprocedure Evaluation (Signed)
Anesthesia Post Note  Patient: [name] [name]  Procedure(s) Performed: Procedure(s) (LRB): FLEXIBLE SIGMOIDOSCOPY (N/A)  Patient location during evaluation: PACU Anesthesia Type: MAC Level of consciousness: awake and alert Pain management: pain level controlled Vital Signs Assessment: post-procedure vital signs reviewed and stable Respiratory status: spontaneous breathing, nonlabored ventilation, respiratory function stable and patient connected to nasal cannula oxygen Cardiovascular status: stable and blood pressure returned to baseline Anesthetic complications: no    Last Vitals:  Vitals:   02/12/16 1430 02/12/16 1436  BP: (!) 117/59 (!) 117/59  Pulse: (!) 112 (!) 107  Resp: 19 (!) 23  Temp:      Last Pain:  Vitals:   02/12/16 1412  TempSrc:   PainSc: 3                  Jaylene Arrowood [name]

## 2016-02-12 NOTE — Op Note (Signed)
[facility] Patient Name: [name] [name] Procedure Date: 02/12/2016 MRN: [id number] Attending MD: Gatha Mayer , MD Date of Birth: 08/14/1950 CSN: [id number] Age: 65 Admit Type: Inpatient Procedure:                SIGMOIDOSCOPY WITH STENT PLACEMENT Indications:              Colonic obstruction, For therapy of colonic                            obstruction Providers:                Gatha Mayer, MD Referring MD:              Medicines:                Monitored Anesthesia Care Complications:            No immediate complications. Estimated Blood Loss:     Estimated blood loss was minimal. Procedure:                Pre-Anesthesia Assessment:                           - Prior to the procedure, a History and Physical                            was performed, and patient medications and                            allergies were reviewed. The patient's tolerance of                            previous anesthesia was also reviewed. The risks                            and benefits of the procedure and the sedation                            options and risks were discussed with the patient.                            All questions were answered, and informed consent                            was obtained. Prior Anticoagulants: The patient                            last took Xarelto (rivaroxaban) 3 days prior to the                            procedure. ASA Grade Assessment: III - A patient                            with severe systemic disease. After reviewing the  risks and benefits, the patient was deemed in                            satisfactory condition to undergo the procedure.                           After obtaining informed consent, the colonoscope                            was passed under direct vision. Throughout the                            procedure, the patient's blood pressure, pulse, and                            oxygen  saturations were monitored continuously. The                            Colonoscope was introduced through the anus and                            advanced to the the colocolonic anastomosis. The                            colonoscopy was technically difficult and complex                            due to a partially obstructing mass. The patient                            tolerated the procedure well. The quality of the                            bowel preparation was good. No anatomical landmarks                            were photographed. Findings:      The perianal and digital rectal examinations were normal.      A malignant-appearing, intrinsic severe stenosis was found in the distal       sigmoid colon and was non-traversed. Using fluoroscopy and a biliary       wire and a biliary stone retrieval balloon was able to pass the wire and       then the balloon catheter through stricture and confirm dilated proximal       colon. This was stented with a 22 mm x 9 cm WallFlex stent under       fluoroscopic guidance. Estimated blood loss was minimal.      The exam was otherwise without abnormality. 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 From                            recurrent colon cancer. Prosthesis placed. 40mm x 9  cm wallflex stent                           - The examination was otherwise normal.                           - No specimens collected. Moderate Sedation:      Please see anesthesia notes, moderate sedation not given Recommendation:           - Return patient to hospital ward for ongoing care.                           - Full liquid diet.                           - Continue present medications.                           - Resume Xarelto (rivaroxaban) at prior dose                            tomorrow.                           - No repeat colonoscopy. Procedure Code(s):        --- Professional  ---                           [phone], Sigmoidoscopy, flexible; with placement of                            endoscopic stent (includes pre- and post-dilation                            and guide wire passage, when performed) Diagnosis Code(s):        --- Professional ---                           K56.69, Other intestinal obstruction                           K56.60, Unspecified intestinal obstruction CPT copyright 2016 [employer]. All rights reserved. The codes documented in this report are preliminary and upon coder review may  be revised to meet current compliance requirements. Gatha Mayer, MD 02/12/2016 4:02:30 PM This report has been signed electronically. Number of Addenda: 0

## 2016-02-12 NOTE — Progress Notes (Signed)
PROGRESS NOTE    [name] [name]  [id number] DOB: Dec 20, 1950 DOA: 02/09/2016  PCP: Kristine Garbe, MD   Brief Narrative:  65 y/o female with metastatic colon cancer who is currently on observation presents with abdominal pain, nausea and vomiting. CT suggests a possible malignant obstruction in distal colon.   Subjective: No complaints today.    Assessment & Plan:   Active Problems:   Colorectal cancer, stage IV, obstructing s/p stent x2, ostomy, then LAR 03/06/2015 - surgery and GI evaluting and discussing best way to relieve her obstruction- she does not want a diverting colostomy - Dr Carlean Purl has decided along with the patient that he will attempt to place a stent today - - unfortunately, she will remain a high risk for bleeding if the stent causes erosion into the bowel wall- anticoagulation may need to be discontinued in the long run if the stent causes this complication    Pulmonary embolism in 2016 - holding anticoagulation for now - may need to d/c if GI bleed continues     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  - not on chemo at this time- palliative care has been suggested by her oncologist but she has declined it for now  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) 108 (!) 112 (!) 107 (!) 106  Resp: (!) 25 19 (!) 23 20  Temp:    98.6 F (37 C)  TempSrc:     Oral  SpO2: 98% 97% 99% 98%  Weight:      Height:        Intake/Output Summary (Last 24 hours) at 02/12/16 1540 Last data filed at 02/12/16 1346  Gross per 24 hour  Intake             1020 ml  Output                4 ml  Net             1016 ml   Filed Weights   02/11/16 0538 02/12/16 0610 02/12/16 1135  Weight: 67.6 kg (149 lb 0.5 oz) 69.1 kg (152 lb 5.4 oz) 68.9 kg (152 lb)    Examination: General exam: Appears comfortable  HEENT: PERRLA, oral mucosa moist, no sclera icterus or thrush Respiratory system: Clear to auscultation. Respiratory effort normal. Cardiovascular system: S1 & S2 heard, RRR.  No murmurs  Gastrointestinal system: Abdomen soft, non-tender, nondistended. Normal bowel sound. No organomegaly Central nervous system: Alert and oriented. No focal neurological deficits. Extremities: No cyanosis, clubbing or edema Skin: No rashes or ulcers Psychiatry:  Mood & affect appropriate.     Data Reviewed: I have personally reviewed following labs and imaging studies  CBC:  Recent Labs Lab 02/09/16 0506 02/09/16 2157 02/10/16 0320 02/11/16 0542  WBC 15.0*  --  15.9* 14.5*  NEUTROABS 13.0*  --   --   --   HGB 6.8* 8.5* 8.1* 8.4*  HCT 21.3* 25.9* 25.4* 25.9*  MCV 76.9*  --  78.4 78.5  PLT 730*  --  686* 123456*   Basic Metabolic Panel:  Recent Labs Lab 02/09/16 0506 02/10/16 0320 02/11/16 0542  NA 133* 138 137  K 4.0 3.1* 3.9  CL 101 107 109  CO2 22 22 19*  GLUCOSE 96 67 105*  BUN 8 6 5*  CREATININE 0.39* 0.38* 0.39*  CALCIUM 9.4 8.9 9.0   GFR: Estimated Creatinine Clearance: 62.4 mL/min (by C-G formula based on SCr of 0.8 mg/dL). Liver Function Tests:  Recent Labs Lab 02/09/16 0506  AST 30  ALT 17  ALKPHOS 247*  BILITOT 0.8  PROT 6.3*  ALBUMIN 1.9*    Recent Labs Lab 02/09/16 0506  LIPASE 71*   No results for input(s): AMMONIA in the last 168 hours. Coagulation Profile:  Recent Labs Lab 02/09/16 0506 02/11/16 0542  INR 2.14  1.31   Cardiac Enzymes: No results for input(s): CKTOTAL, CKMB, CKMBINDEX, TROPONINI in the last 168 hours. BNP (last 3 results) No results for input(s): PROBNP in the last 8760 hours. HbA1C: No results for input(s): HGBA1C in the last 72 hours. CBG: No results for input(s): GLUCAP in the last 168 hours. Lipid Profile: No results for input(s): CHOL, HDL, LDLCALC, TRIG, CHOLHDL, LDLDIRECT in the last 72 hours. Thyroid Function Tests: No results for input(s): TSH, T4TOTAL, FREET4, T3FREE, THYROIDAB in the last 72 hours. Anemia Panel: No results for input(s): VITAMINB12, FOLATE, FERRITIN, TIBC, IRON, RETICCTPCT in the last 72 hours. Urine analysis:    Component Value Date/Time   COLORURINE YELLOW 02/09/2016 [place] 02/09/2016 0937   LABSPEC >1.046 (H) 02/09/2016 0937   LABSPEC 1.020 12/15/2015 1430   PHURINE 6.5 02/09/2016 0937   GLUCOSEU NEGATIVE 02/09/2016 0937   GLUCOSEU Negative 12/15/2015 1430   HGBUR NEGATIVE 02/09/2016 0937   BILIRUBINUR NEGATIVE 02/09/2016 0937   BILIRUBINUR Negative 12/15/2015 1430   KETONESUR NEGATIVE 02/09/2016 0937   PROTEINUR NEGATIVE 02/09/2016 0937   UROBILINOGEN 0.2 12/15/2015 1430   NITRITE NEGATIVE 02/09/2016 0937   LEUKOCYTESUR NEGATIVE 02/09/2016 0937   LEUKOCYTESUR Trace 12/15/2015 1430   Sepsis Labs: @LABRCNTIP (procalcitonin:4,lacticidven:4) )No results found for this or any previous visit (from the past 240 hour(s)).       Radiology Studies: Dg Abd 1 View - Kub  Result Date: 02/12/2016 CLINICAL DATA:  Colonic stent placement; fluoro time 6 min 32 sec EXAM: ABDOMEN - 1 VIEW; DG C-ARM 1-60 MIN-NO REPORT COMPARISON:  None. FINDINGS: Three provided images show placement of a stent from the rectum to the lower sigmoid colon. Contrast outlines the distal colon above the stent. IMPRESSION: Fluoroscopic imaging provided for colonic stent placement. Electronically Signed   By: Lajean Manes M.D.   On: 02/12/2016 13:59   Dg  C-arm 1-60 Min-no Report  Result Date: 02/12/2016 CLINICAL DATA: colonic stent C-ARM 1-60 MINUTES Fluoroscopy was utilized by the requesting physician.  No radiographic interpretation.      Scheduled Meds: . ferrous sulfate  325 mg Oral Q breakfast  . magic mouthwash  5 mL Oral QID  . pantoprazole  40 mg Oral Daily  . sodium chloride flush  10-40 mL Intracatheter Q12H  . sodium chloride flush  3 mL Intravenous Q12H  . vitamin B-12  500 mcg Oral Daily   Continuous Infusions: . sodium chloride 100 mL/hr at 02/10/16 1553  LOS: 2 days    Time spent in minutes: 53    [place], MD [facility] Pager: [email or web address] Password [email or web address] 02/12/2016, 3:40 PM

## 2016-02-12 NOTE — Anesthesia Preprocedure Evaluation (Signed)
Anesthesia Evaluation  Patient identified by MRN, date of birth, ID band Patient awake    Reviewed: Allergy & Precautions, NPO status , Patient's Chart, lab work & pertinent test results  Airway Mallampati: I  TM Distance: >3 FB Neck ROM: Full    Dental   Pulmonary former smoker,    Pulmonary exam normal        Cardiovascular Normal cardiovascular exam     Neuro/Psych    GI/Hepatic   Endo/Other    Renal/GU      Musculoskeletal   Abdominal   Peds  Hematology   Anesthesia Other Findings   Reproductive/Obstetrics                             Anesthesia Physical Anesthesia Plan  ASA: III  Anesthesia Plan: MAC   Post-op Pain Management:    Induction: Intravenous  Airway Management Planned: Mask  Additional Equipment:   Intra-op Plan:   Post-operative Plan:   Informed Consent: I have reviewed the patients History and Physical, chart, labs and discussed the procedure including the risks, benefits and alternatives for the proposed anesthesia with the patient or authorized representative who has indicated his/her understanding and acceptance.     Plan Discussed with: CRNA and Surgeon  Anesthesia Plan Comments:         Anesthesia Quick Evaluation

## 2016-02-12 NOTE — Brief Op Note (Signed)
02/09/2016 - 02/12/2016  1:43 PM  PATIENT:  [name] [name]  65 y.o. female  PRE-OPERATIVE DIAGNOSIS:  Colonic obstruction  POST-OPERATIVE DIAGNOSIS:  colonic stent placed  PROCEDURE:  Procedure(s): COLONOSCOPY WITH PROPOFOL (N/A)  SURGEON:  Surgeon(s) and Role:    * Gatha Mayer, MD - Primary      ANESTHESIA:   MAC  EBL:  Total I/O In: -  Out: 3 [Urine:2; Stool:1]  Sigmoidoscopy - distal sigmoid colon cancer at anastomosis - obstructing. Probed with biliary wire and biliary retrial balloon under fluoro and was able to cross the obstruction - conformed location with conrast.  22x 90 mm SEMS Wallstent successfully placed - distal clip placement for guidance.

## 2016-02-12 NOTE — Transfer of Care (Signed)
Immediate Anesthesia Transfer of Care Note  Patient: [name] [name]  Procedure(s) Performed: Procedure(s): COLONOSCOPY WITH PROPOFOL (N/A)  Patient Location: PACU  Anesthesia Type:MAC  Level of Consciousness:  sedated, patient cooperative and responds to stimulation  Airway & Oxygen Therapy:Patient Spontanous Breathing   Post-op Assessment:  Report given to PACU RN and Post -op Vital signs reviewed and stable  Post vital signs:  Reviewed and stable  Last Vitals:  Vitals:   02/12/16 0610 02/12/16 1135  BP: 111/61 (!) 122/59  Pulse: (!) 102 (!) 114  Resp: 18 19  Temp: 37.7 C [id number] C    Complications: No apparent anesthesia complications

## 2016-02-13 ENCOUNTER — Encounter (HOSPITAL_COMMUNITY): Payer: Self-pay | Admitting: Internal Medicine

## 2016-02-13 LAB — CBC
HCT: 27.2 % — ABNORMAL LOW (ref 36.0–46.0)
HEMATOCRIT: 23.5 % — AB (ref 36.0–46.0)
HEMOGLOBIN: 7.5 g/dL — AB (ref 12.0–15.0)
Hemoglobin: 8.9 g/dL — ABNORMAL LOW (ref 12.0–15.0)
MCH: 25.2 pg — AB (ref 26.0–34.0)
MCH: 25.9 pg — AB (ref 26.0–34.0)
MCHC: 31.9 g/dL (ref 30.0–36.0)
MCHC: 32.7 g/dL (ref 30.0–36.0)
MCV: 78.9 fL (ref 78.0–100.0)
MCV: 79.3 fL (ref 78.0–100.0)
PLATELETS: 412 10*3/uL — AB (ref 150–400)
Platelets: 431 10*3/uL — ABNORMAL HIGH (ref 150–400)
RBC: 2.98 MIL/uL — ABNORMAL LOW (ref 3.87–5.11)
RBC: 3.43 MIL/uL — ABNORMAL LOW (ref 3.87–5.11)
RDW: 21.9 % — AB (ref 11.5–15.5)
RDW: 20.4 % — AB (ref 11.5–15.5)
WBC: 13 10*3/uL — ABNORMAL HIGH (ref 4.0–10.5)
WBC: 13.6 10*3/uL — ABNORMAL HIGH (ref 4.0–10.5)

## 2016-02-13 LAB — PREPARE RBC (CROSSMATCH)

## 2016-02-13 MED ORDER — SODIUM CHLORIDE 0.9 % IV SOLN
125.0000 mg | Freq: Once | INTRAVENOUS | Status: AC
  Administered 2016-02-13: 125 mg via INTRAVENOUS
  Filled 2016-02-13: qty 10

## 2016-02-13 MED ORDER — FOLIC ACID 5 MG/ML IJ SOLN
1.0000 mg | Freq: Every day | INTRAMUSCULAR | Status: DC
  Administered 2016-02-13 – 2016-02-14 (×2): 1 mg via INTRAVENOUS
  Filled 2016-02-13 (×3): qty 0.2

## 2016-02-13 MED ORDER — SODIUM CHLORIDE 0.9 % IV SOLN
25.0000 mg | Freq: Once | INTRAVENOUS | Status: AC
  Administered 2016-02-13: 25 mg via INTRAVENOUS
  Filled 2016-02-13: qty 2

## 2016-02-13 MED ORDER — RIVAROXABAN 20 MG PO TABS
20.0000 mg | ORAL_TABLET | Freq: Every day | ORAL | Status: DC
  Administered 2016-02-13: 20 mg via ORAL
  Filled 2016-02-13 (×2): qty 1

## 2016-02-13 MED ORDER — SODIUM CHLORIDE 0.9 % IV SOLN
Freq: Once | INTRAVENOUS | Status: AC
  Administered 2016-02-13: 16:00:00 via INTRAVENOUS

## 2016-02-13 NOTE — Progress Notes (Signed)
Progress Note   Subjective  Chief Complaint: Colon Cancer stage IV with obstruction and s/p stent, GI Bleed  Pt found sitting up in bed, tolerating full liquid diet. Expresses that she had a lot of abdominal pain last night and into this morning, just received pain meds which are helping. Did pass multiple loose stools last night. Denies any nausea/vomiting or further blood.    Objective   Vital signs in last 24 hours: Temp:  [98.3 F (36.8 C)-99.4 F (37.4 C)] 98.3 F (36.8 C) (08/11 0435) Pulse Rate:  [106-117] 109 (08/11 0435) Resp:  [17-29] 18 (08/11 0435) BP: (99-123)/(46-62) 115/58 (08/11 0435) SpO2:  [96 %-100 %] 96 % (08/11 0435) Weight:  [150 lb 2.1 oz (68.1 kg)-152 lb (68.9 kg)] 150 lb 2.1 oz (68.1 kg) (08/11 0435) Last BM Date: 02/13/16 General: African American female in NAD Heart:  Regular rate and rhythm; no murmurs Lungs: Respirations even and unlabored, lungs CTA bilaterally Abdomen:  Soft, mild ttp LUQ and nondistended. Normal bowel sounds. Extremities:  Without edema. Neurologic:  Alert and oriented,  grossly normal neurologically. Psych:  Cooperative. Normal mood and affect.  Intake/Output from previous day: 08/10 0701 - 08/11 0700 In: 1380 [P.O.:480; I.V.:900] Out: 3 [Urine:2; Stool:1]  Lab Results:  Recent Labs  02/11/16 0542 02/13/16 0420  WBC 14.5* 13.0*  HGB 8.4* 7.5*  HCT 25.9* 23.5*  PLT 520* 431*   BMET  Recent Labs  02/11/16 0542  NA 137  K 3.9  CL 109  CO2 19*  GLUCOSE 105*  BUN 5*  CREATININE 0.39*  CALCIUM 9.0   LFT No results for input(s): PROT, ALBUMIN, AST, ALT, ALKPHOS, BILITOT, BILIDIR, IBILI in the last 72 hours. PT/INR  Recent Labs  02/11/16 0542  LABPROT 16.4*  INR 1.31    Studies/Results: Dg Abd 1 View - Kub  Result Date: 02/12/2016 CLINICAL DATA:  Colonic stent placement; fluoro time 6 min 32 sec EXAM: ABDOMEN - 1 VIEW; DG C-ARM 1-60 MIN-NO REPORT COMPARISON:  None. FINDINGS: Three provided images  show placement of a stent from the rectum to the lower sigmoid colon. Contrast outlines the distal colon above the stent. IMPRESSION: Fluoroscopic imaging provided for colonic stent placement. Electronically Signed   By: Lajean Manes M.D.   On: 02/12/2016 13:59   Dg C-arm 1-60 Min-no Report  Result Date: 02/12/2016 CLINICAL DATA: colonic stent C-ARM 1-60 MINUTES Fluoroscopy was utilized by the requesting physician.  No radiographic interpretation.   Colonoscopy 02/12/16-Dr. Carlean Purl Findings:      The perianal and digital rectal examinations were normal.      A malignant-appearing, intrinsic severe stenosis was found in the distal       sigmoid colon and was non-traversed. Using fluoroscopy and a biliary       wire and a biliary stone retrieval balloon was able to pass the wire and       then the balloon catheter through stricture and confirm dilated proximal       colon. This was stented with a 22 mm x 9 cm WallFlex stent under       fluoroscopic guidance. Estimated blood loss was minimal.      The exam was otherwise without abnormality. 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 From  recurrent colon cancer. Prosthesis placed. 56mm x 9                            cm wallflex stent                           - The examination was otherwise normal.                           - No specimens collected.    Assessment / Plan:    Metastatic colon cancer: with colonic stricture and dilated proximal colon-s/p stent placement 02/12/16, with some abdominal pain and loose stools overnight, to be expected, otherwise tolerating full liquid diet- can resume Xarelto from GI viewpoint, will need to weight risk/benefits going forward   Discussed above with Dr. Carlean Purl. We will sign off.   LOS: 3 days   Levin Erp  02/13/2016, 8:48 AM  Pager # [phone]    [facility] Attending   I have taken an interval history,  reviewed the chart and examined the patient. I agree with the Advanced Practitioner's note, impression and recommendations.   She is ok this afternoon. Do not think any problems from the stent beyond some pain as is expected as the stent expands against the tumor - this usually subsides.  Gatha Mayer, MD, [facility] [phone] (pager) [phone] after 5 PM, weekends and holidays  02/13/2016 3:29 PM    Do not think any complication from

## 2016-02-13 NOTE — Progress Notes (Signed)
PROGRESS NOTE    [name] [name]  [id number] DOB: 07-05-1951 DOA: 02/09/2016  PCP: Kristine Garbe, MD   Brief Narrative:  65 y/o female with metastatic colon cancer who is currently on observation presents with abdominal pain, nausea and vomiting. CT suggests a possible malignant obstruction in distal colon.   Subjective: Has some abdominal pain and diarrhea last night. Feels better this AM.   Assessment & Plan:   Active Problems: Colorectal cancer, stage IV, obstructing s/p stent x2, ostomy, then LAR 03/06/2015 - surgery and GI evaluting and discussing best way to relieve her obstruction- she does not want a diverting colostomy - Dr Carlean Purl has decided along with the patient to have a (palliative) stent placed which was done on 8/10 - unfortunately, she will remain a high risk for bleeding if the stent causes erosion into the bowel wall- anticoagulation may need to be discontinued in the long run if the stent causes this complication  Pulmonary embolism in 2016 - no GI bleed at this time- will resume Xarelto today - may need to d/c if GI bleed continues  Anemia - slow blood loss due to cancer - transfuse 1 U PRBC today- has received 2 U PRBC already - folic acid is low- although ferritin is normal, Iron saturation is low- will replace both IV     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  - not on chemo at this time - palliative care has been suggested by her oncologist but she has declined it for now  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 - on Zometa    Symptomatic anemia - possibly due to slow chronic GI bleed from cancer - transfused 2 U PRBC   Severe protein calorie malnutrition and b/l pedal edema - due to underlying illness   DVT prophylaxis: SCDs Code Status: Full code Family Communication:  Disposition Plan: home when stable Consultants:   GI  gen surgery   oncology Procedures:   none Antimicrobials:  Anti-infectives    None        Objective: Vitals:   02/12/16 2048 02/13/16 0435 02/13/16 1300 02/13/16 1320  BP: (!) 115/58 (!) 115/58 (!) 116/59 (!) 115/53  Pulse: (!) 117 (!) 109 (!) 115 (!) 116  Resp: 20 18 18 18   Temp: 99.4 F (37.4 C) 98.3 F (36.8 C) 99.1 F (37.3 C) 99.1 F (37.3 C)  TempSrc: Oral Oral Oral Oral  SpO2: 98% 96% 97% 100%  Weight:  68.1 kg (150 lb 2.1 oz)    Height:        Intake/Output Summary (Last 24 hours) at 02/13/16 1440 Last data filed at 02/13/16 0650  Gross per 24 hour  Intake              480 ml  Output                0 ml  Net              480 ml   Filed Weights   02/12/16 0610 02/12/16 1135 02/13/16 0435  Weight: 69.1 kg (152 lb 5.4 oz) 68.9 kg (152 lb) 68.1 kg (150 lb 2.1 oz)    Examination: General exam: Appears comfortable  HEENT: PERRLA, oral mucosa moist, no sclera icterus or thrush Respiratory system: Clear to auscultation. Respiratory effort normal. Cardiovascular system: S1 & S2 heard, RRR.  No murmurs  Gastrointestinal system: Abdomen soft, non-tender, nondistended. Normal bowel sound. No organomegaly Central nervous system: Alert and oriented. No focal neurological deficits. Extremities: No cyanosis,  clubbing or edema Skin: No rashes or ulcers Psychiatry:  Mood & affect appropriate.     Data Reviewed: I have personally reviewed following labs and imaging studies  CBC:  Recent Labs Lab 02/09/16 0506 02/09/16 2157 02/10/16 0320 02/11/16 0542 02/13/16 0420  WBC 15.0*  --  15.9* 14.5* 13.0*  NEUTROABS 13.0*  --   --   --   --   HGB 6.8* 8.5* 8.1* 8.4* 7.5*  HCT 21.3* 25.9* 25.4* 25.9* 23.5*  MCV 76.9*  --  78.4 78.5 78.9  PLT 730*  --  686* 520* [id number]*   Basic Metabolic Panel:  Recent Labs Lab 02/09/16 0506 02/10/16 0320 02/11/16 0542  NA 133* 138 137  K 4.0 3.1* 3.9  CL 101 107 109  CO2 22 22 19*  GLUCOSE 96 67 105*  BUN 8 6 5*  CREATININE 0.39* 0.38* 0.39*  CALCIUM 9.4 8.9 9.0   GFR: Estimated Creatinine Clearance: 61.9  mL/min (by C-G formula based on SCr of 0.8 mg/dL). Liver Function Tests:  Recent Labs Lab 02/09/16 0506  AST 30  ALT 17  ALKPHOS 247*  BILITOT 0.8  PROT 6.3*  ALBUMIN 1.9*    Recent Labs Lab 02/09/16 0506  LIPASE 71*   No results for input(s): AMMONIA in the last 168 hours. Coagulation Profile:  Recent Labs Lab 02/09/16 0506 02/11/16 0542  INR 2.14 1.31   Cardiac Enzymes: No results for input(s): CKTOTAL, CKMB, CKMBINDEX, TROPONINI in the last 168 hours. BNP (last 3 results) No results for input(s): PROBNP in the last 8760 hours. HbA1C: No results for input(s): HGBA1C in the last 72 hours. CBG: No results for input(s): GLUCAP in the last 168 hours. Lipid Profile: No results for input(s): CHOL, HDL, LDLCALC, TRIG, CHOLHDL, LDLDIRECT in the last 72 hours. Thyroid Function Tests: No results for input(s): TSH, T4TOTAL, FREET4, T3FREE, THYROIDAB in the last 72 hours. Anemia Panel: No results for input(s): VITAMINB12, FOLATE, FERRITIN, TIBC, IRON, RETICCTPCT in the last 72 hours. Urine analysis:    Component Value Date/Time   COLORURINE YELLOW 02/09/2016 [place] 02/09/2016 0937   LABSPEC >1.046 (H) 02/09/2016 0937   LABSPEC 1.020 12/15/2015 1430   PHURINE 6.5 02/09/2016 0937   GLUCOSEU NEGATIVE 02/09/2016 0937   GLUCOSEU Negative 12/15/2015 1430   HGBUR NEGATIVE 02/09/2016 0937   BILIRUBINUR NEGATIVE 02/09/2016 0937   BILIRUBINUR Negative 12/15/2015 1430   KETONESUR NEGATIVE 02/09/2016 0937   PROTEINUR NEGATIVE 02/09/2016 0937   UROBILINOGEN 0.2 12/15/2015 1430   NITRITE NEGATIVE 02/09/2016 0937   LEUKOCYTESUR NEGATIVE 02/09/2016 0937   LEUKOCYTESUR Trace 12/15/2015 1430   Sepsis Labs: @LABRCNTIP (procalcitonin:4,lacticidven:4) )No results found for this or any previous visit (from the past 240 hour(s)).       Radiology Studies: Dg Abd 1 View - Kub  Result Date: 02/12/2016 CLINICAL DATA:  Colonic stent placement; fluoro time 6 min 32  sec EXAM: ABDOMEN - 1 VIEW; DG C-ARM 1-60 MIN-NO REPORT COMPARISON:  None. FINDINGS: Three provided images show placement of a stent from the rectum to the lower sigmoid colon. Contrast outlines the distal colon above the stent. IMPRESSION: Fluoroscopic imaging provided for colonic stent placement. Electronically Signed   By: Lajean Manes M.D.   On: 02/12/2016 13:59   Dg C-arm 1-60 Min-no Report  Result Date: 02/12/2016 CLINICAL DATA: colonic stent C-ARM 1-60 MINUTES Fluoroscopy was utilized by the requesting physician.  No radiographic interpretation.      Scheduled Meds: . sodium chloride   Intravenous Once  .  ferrous sulfate  325 mg Oral Q breakfast  . magic mouthwash  5 mL Oral QID  . pantoprazole  40 mg Oral Daily  . sodium chloride flush  10-40 mL Intracatheter Q12H  . sodium chloride flush  3 mL Intravenous Q12H  . vitamin B-12  500 mcg Oral Daily   Continuous Infusions:     LOS: 3 days    Time spent in minutes: 71    Tyreisha Ungar, MD [facility] Pager: [email or web address] Password [email or web address] 02/13/2016, 2:40 PM

## 2016-02-13 NOTE — Progress Notes (Signed)
Pt hemoglobin this am 7.5. On-call paged, will carry out any new orders.

## 2016-02-13 NOTE — Progress Notes (Signed)
Pt stented yesterday and GI has signed off/restarting Xarelto.  If there is an issue we can assist with, please call.  We will come back anytime you need [place].     Agree with above.  Alphonsa Overall, MD, [facility] Pager: [phone] Office phone:  [phone]

## 2016-02-14 ENCOUNTER — Inpatient Hospital Stay (HOSPITAL_COMMUNITY): Payer: Medicare Other

## 2016-02-14 DIAGNOSIS — R1032 Left lower quadrant pain: Secondary | ICD-10-CM

## 2016-02-14 DIAGNOSIS — R1031 Right lower quadrant pain: Secondary | ICD-10-CM

## 2016-02-14 LAB — CBC
HEMATOCRIT: 27.1 % — AB (ref 36.0–46.0)
Hemoglobin: 8.8 g/dL — ABNORMAL LOW (ref 12.0–15.0)
MCH: 25.9 pg — AB (ref 26.0–34.0)
MCHC: 32.5 g/dL (ref 30.0–36.0)
MCV: 79.7 fL (ref 78.0–100.0)
PLATELETS: 417 10*3/uL — AB (ref 150–400)
RBC: 3.4 MIL/uL — ABNORMAL LOW (ref 3.87–5.11)
RDW: 21 % — AB (ref 11.5–15.5)
WBC: 21.7 10*3/uL — AB (ref 4.0–10.5)

## 2016-02-14 MED ORDER — HYDROMORPHONE HCL 2 MG PO TABS
4.0000 mg | ORAL_TABLET | ORAL | Status: DC | PRN
  Administered 2016-02-14 (×2): 4 mg via ORAL
  Filled 2016-02-14: qty 2

## 2016-02-14 MED ORDER — POLYETHYLENE GLYCOL 3350 17 G PO PACK
17.0000 g | PACK | Freq: Every day | ORAL | Status: DC
  Administered 2016-02-14 – 2016-02-15 (×2): 17 g via ORAL
  Filled 2016-02-14 (×2): qty 1

## 2016-02-14 MED ORDER — HYDROMORPHONE HCL 4 MG PO TABS: 4.0000 mg | ORAL_TABLET | ORAL | 0 refills | Status: DC | PRN

## 2016-02-14 NOTE — Progress Notes (Signed)
PROGRESS NOTE    [name] [name]  [id number] DOB: [birth year] DOA: 02/09/2016  PCP: Kristine Garbe, MD   Brief Narrative:  65 y/o female with metastatic colon cancer who is currently on observation presents with abdominal pain, nausea and vomiting. CT suggests a possible malignant obstruction in distal colon.   Subjective: Continues to have abdominal pain. Passing small amounts of liquid stool.   Assessment & Plan:   Active Problems: Colorectal cancer, stage IV, obstructing s/p stent x2, ostomy, then LAR 03/06/2015 - surgery and GI evaluting and discussing best way to relieve her obstruction- she does not want a diverting colostomy - Dr Carlean Purl has decided along with the patient to have a (palliative) stent placed which was done on 8/10 - unfortunately, she will remain a high risk for bleeding if the stent causes erosion into the bowel wall- anticoagulation may need to be discontinued in the long run if the stent causes this complication - control pain with medications- she still has stool above the stent- will give Miralax and soap suds enema- OK to send home per GI - will monitor over night for stool output  Pulmonary embolism in 2016 - no GI bleed at this time-  resumed Xarelto today - may need to d/c if GI bleed continues or increases - discussed with patient in detail  Anemia - slow blood loss due to cancer - transfuse 1 U PRBC today- has received 2 U PRBC already - folic acid is low- although ferritin is normal, Iron saturation is low- replaced both IV     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) 126 (!) 110 (!) 130 (!) 110  Resp: 16  (!) 22   Temp: 100.1 F (37.8 C) 99.8 F (37.7 C) 99.8 F (37.7 C) 99.6 F (37.6 C)  TempSrc: Oral Oral Oral Oral  SpO2: 98%  98%   Weight:   67.9 kg (149 lb 11.1 oz)   Height:        Intake/Output Summary (Last 24 hours) at 02/14/16 1231 Last data filed at 02/14/16 0700  Gross per 24 hour  Intake              520 ml  Output                0 ml  Net              520 ml   Filed Weights   02/12/16 1135 02/13/16 0435 02/14/16 0443  Weight: 68.9 kg (152 lb) 68.1 kg (150 lb 2.1 oz) 67.9 kg (149 lb 11.1 oz)    Examination: General exam: Appears comfortable  HEENT: PERRLA, oral mucosa moist, no sclera icterus or thrush Respiratory system: Clear to auscultation. Respiratory effort normal. Cardiovascular system: S1 & S2 heard, RRR.  No murmurs  Gastrointestinal system: Abdomen soft, mildly distended  and tender,  Normal bowel sound. No organomegaly Central nervous system: Alert and oriented. No focal neurological deficits. Extremities: No cyanosis, clubbing or edema Skin: No rashes or ulcers Psychiatry:  Mood & affect appropriate.     Data Reviewed: I have personally reviewed following labs and imaging studies  CBC:  Recent Labs Lab 02/09/16 0506  02/10/16 0320 02/11/16 0542 02/13/16 0420 02/13/16 1709 02/14/16 0410  WBC 15.0*  --  15.9* 14.5* 13.0* 13.6* 21.7*  NEUTROABS 13.0*  --   --   --   --   --   --   HGB 6.8*  < > 8.1* 8.4* 7.5* 8.9* 8.8*  HCT 21.3*  < > 25.4* 25.9* 23.5* 27.2* 27.1*  MCV 76.9*  --  78.4 78.5 78.9 79.3 79.7  PLT 730*  --  686* 520* 431* 412* 417*  < > = values in this interval not displayed. Basic Metabolic Panel:  Recent Labs Lab 02/09/16 0506 02/10/16 0320  02/11/16 0542  NA 133* 138 137  K 4.0 3.1* 3.9  CL 101 107 109  CO2 22 22 19*  GLUCOSE 96 67 105*  BUN 8 6 5*  CREATININE 0.39* 0.38* 0.39*  CALCIUM 9.4 8.9 9.0   GFR: Estimated Creatinine Clearance: 61.9 mL/min (by C-G formula based on SCr of 0.8 mg/dL). Liver Function Tests:  Recent Labs Lab 02/09/16 0506  AST 30  ALT 17  ALKPHOS 247*  BILITOT 0.8  PROT 6.3*  ALBUMIN 1.9*    Recent Labs Lab 02/09/16 0506  LIPASE 71*   No results for input(s): AMMONIA in the last 168 hours. Coagulation Profile:  Recent Labs Lab 02/09/16 0506 02/11/16 0542  INR 2.14 1.31   Cardiac Enzymes: No results for input(s): CKTOTAL, CKMB, CKMBINDEX, TROPONINI in the last 168 hours. BNP (last 3 results) No results for input(s): PROBNP in the last 8760 hours. HbA1C: No results for input(s): HGBA1C in the last 72 hours. CBG: No results for input(s): GLUCAP in the last 168 hours. Lipid Profile: No results for input(s): CHOL, HDL, LDLCALC, TRIG, CHOLHDL, LDLDIRECT in the last 72 hours. Thyroid Function Tests: No results for input(s): TSH, T4TOTAL, FREET4, T3FREE, THYROIDAB in the last 72 hours. Anemia Panel: No results for input(s): VITAMINB12, FOLATE, FERRITIN, TIBC, IRON, RETICCTPCT in the last 72 hours. Urine analysis:    Component Value Date/Time   COLORURINE YELLOW 02/09/2016 [place] 02/09/2016 0937   LABSPEC >1.046 (H) 02/09/2016 0937   LABSPEC 1.020 12/15/2015 1430   PHURINE 6.5 02/09/2016 0937   GLUCOSEU NEGATIVE 02/09/2016 0937   GLUCOSEU Negative 12/15/2015 1430   HGBUR NEGATIVE 02/09/2016 0937   BILIRUBINUR NEGATIVE 02/09/2016 0937   BILIRUBINUR Negative 12/15/2015 1430   KETONESUR NEGATIVE 02/09/2016 0937   PROTEINUR NEGATIVE 02/09/2016 0937   UROBILINOGEN 0.2 12/15/2015 1430   NITRITE NEGATIVE 02/09/2016 0937   LEUKOCYTESUR NEGATIVE 02/09/2016 0937   LEUKOCYTESUR Trace 12/15/2015 1430   Sepsis  Labs: @LABRCNTIP (procalcitonin:4,lacticidven:4) )No results found for this or any previous visit (from the past 240 hour(s)).       Radiology Studies: Dg Abd 1 View - Kub  Result Date: 02/12/2016 CLINICAL DATA:  Colonic stent placement; fluoro time 6 min 32 sec EXAM: ABDOMEN - 1 VIEW; DG C-ARM 1-60 MIN-NO REPORT COMPARISON:  None. FINDINGS: Three provided images show placement of a stent from the rectum to the lower sigmoid colon. Contrast outlines the distal colon above the stent. IMPRESSION: Fluoroscopic imaging provided for colonic stent placement. Electronically Signed   By:  Lajean Manes M.D.   On: 02/12/2016 13:59   Dg Abd Portable 1v  Result Date: 02/14/2016 CLINICAL DATA:  Lower abdominal pain. Colon cancer with malignant distal last of [place] colonic stricture requiring stent placement. EXAM: PORTABLE ABDOMEN - 1 VIEW COMPARISON:  02/09/2016 abdominal radiograph and CT abdomen/pelvis. FINDINGS: Distal colonic stent is seen in the pelvis. There is narrowing of the midportion of the stent. No dilated small bowel loops or air-fluid levels. Persistent moderate stool in the remnant colon. No evidence of pneumatosis or pneumoperitoneum. Left lower renal 6 mm stone. Hepatomegaly. IMPRESSION: 1. Distal colonic stent in the pelvis, with narrowing of the midportion of the stent. Persistent moderate stool in the remnant colon, suggesting a degree of persistent distal large bowel obstruction. No dilated small bowel loops. No pneumoperitoneum. 2. Left renal stone. 3. Hepatomegaly. Electronically Signed   By: [name] M.D.   On: 02/14/2016 08:31   Dg C-arm 1-60 Min-no Report  Result Date: 02/12/2016 CLINICAL DATA: colonic stent C-ARM 1-60 MINUTES Fluoroscopy was utilized by the requesting physician.  No radiographic interpretation.      Scheduled Meds: . ferrous sulfate  325 mg Oral Q breakfast  . folic acid  1 mg Intravenous Daily  . magic mouthwash  5 mL Oral QID  . pantoprazole  40 mg  Oral Daily  . polyethylene glycol  17 g Oral Daily  . rivaroxaban  20 mg Oral Q supper  . sodium chloride flush  10-40 mL Intracatheter Q12H  . sodium chloride flush  3 mL Intravenous Q12H  . vitamin B-12  500 mcg Oral Daily   Continuous Infusions:     LOS: 4 days    Time spent in minutes: 66    [place], MD [facility] Pager: [email or web address] Password [facility] 02/14/2016, 12:31 PM

## 2016-02-14 NOTE — Progress Notes (Signed)
   Patient Name: [name] [name] Date of Encounter: 02/14/2016, 11:57 AM    Subjective  Had some more pain overnight - she does think she is better with the stent compared to before Having liquid stools  Says she talked to her cousin and is interested in pursuing hospice  Objective  BP (!) 112/46 (BP Location: Left Arm)   Pulse (!) 110   Temp 99.6 F (37.6 C) (Oral) Comment: recheck  Resp (!) 22   Ht 5' (1.524 m)   Wt 149 lb 11.1 oz (67.9 kg)   SpO2 98%   BMI 29.23 kg/m  Abd is less distended than pre stent is soft and non-tender  CBC Latest Ref Rng & Units 02/14/2016 02/13/2016 02/13/2016  WBC 4.0 - 10.5 K/uL 21.7(H) 13.6(H) 13.0(H)  Hemoglobin 12.0 - 15.0 g/dL 8.8(L) 8.9(L) 7.5(L)  Hematocrit 36.0 - 46.0 % 27.1(L) 27.2(L) 23.5(L)  Platelets 150 - 400 K/uL 417(H) 412(H) 431(H)   KUB - viewed by me - stent is in appropriate position and shape. 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

## 2016-02-15 DIAGNOSIS — E440 Moderate protein-calorie malnutrition: Secondary | ICD-10-CM

## 2016-02-15 MED ORDER — HEPARIN SOD (PORK) LOCK FLUSH 100 UNIT/ML IV SOLN
500.0000 [IU] | INTRAVENOUS | Status: AC | PRN
  Administered 2016-02-15: 500 [IU]

## 2016-02-15 MED ORDER — FOLIC ACID 1 MG PO TABS
1.0000 mg | ORAL_TABLET | Freq: Every day | ORAL | Status: DC
  Administered 2016-02-15: 1 mg via ORAL
  Filled 2016-02-15: qty 1

## 2016-02-15 MED ORDER — FOLIC ACID 1 MG PO TABS: 1.0000 mg | ORAL_TABLET | Freq: Every day | ORAL | 0 refills | Status: AC

## 2016-02-15 MED ORDER — SENNOSIDES-DOCUSATE SODIUM 8.6-50 MG PO TABS: 2.0000 | ORAL_TABLET | Freq: Every day | ORAL | Status: AC

## 2016-02-15 NOTE — Care Management Important Message (Signed)
Important Message  Patient Details  Name: [name] [name] MRN: [id number] Date of Birth: [birth year]   Medicare Important Message Given:  Yes    Erenest Rasher, RN 02/15/2016, 11:17 AM

## 2016-02-15 NOTE — Care Management Note (Addendum)
Case Management Note  Patient Details  Name: [name] [name] MRN: [id number] Date of Birth: July 14, 1950  Subjective/Objective:     colon cancer stage IV with obstruction and s/p stent, GI bleed                Action/Plan: Discharge Planning: AVS reviewed:  NCM spoke to pt at bedside. States she lives at home with husband, [name]. Pt states she has RW and bedside commode at home. She wants to dc home with Hospice. Provided pt with Hospice list and she will speak with her cousin to see which one they used. Will continue to follow to arrange Home Hospice.   1215 NCM spoke to pt and requested [facility], contacted [facility] Referral line. Waiting call back. Faxed orders, H&P and progress note to [place] [phone]. Made aware pt is requesting hospital bed. Pt is ok dc home with out hospital bed today states she has to clear area for bed at home. But does have a bed she can sleep in until her hospital bed arrives.   02/15/2016 Pt states she does want to receive hospital bed prior to going home. She will contact her brother to see if he can clear area for bed.   Alvie Heidelberg MD  Expected Discharge Date: 02/15/2016               Expected Discharge Plan:  [place]  In-House Referral:  NA  Discharge planning Services  CM Consult  Post Acute Care Choice:  Hospice Choice offered to:  Patient  DME Arranged:  Hospital bed DME Agency:     HH Arranged:  RN [facility] Agency:     Status of Service:  In process, will continue to follow  If discussed at Long Length of Stay Meetings, dates discussed:    Additional Comments:  Erenest Rasher, RN 02/15/2016, 11:28 AM

## 2016-02-15 NOTE — Progress Notes (Addendum)
Pt is A&Ox4, ambulatory.Discharge instructions reviewed with pt and spouse. Questions, concerns denied. Spouse was at bedside.

## 2016-02-15 NOTE — Discharge Summary (Signed)
Physician Discharge Summary  [name] [name] [id number] DOB: [birth year] DOA: 02/09/2016  PCP: Kristine Garbe, MD  Admit date: 02/09/2016 Discharge date: 02/15/2016  Admitted From: HOME Disposition:  Home with hospice   Recommendations for Outpatient Follow-up:  1. 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 CT suggests a possible malignant obstruction in distal colon.    Hospital Course:  Active Problems: Colorectal cancer, stage IV, obstructing s/p stent x2, ostomy, then LAR 03/06/2015 - surgery and GI evaluting and discussing best way to relieve her obstruction- she does not want a diverting colostomy - Dr Carlean Purl spoke with patient in detail and decision made to have a (palliative) stent placed which was done on 8/10 - unfortunately, she will remain a high risk for bleeding if the stent causes erosion into the bowel wall- anticoagulation may need to be discontinued in the long run if the stent causes this complication - control pain with medications- she still had stool above the stent-  given Miralax and soap suds enema with good results- OK to send home per GI - will ensure she has a bowel regimen  to prevent constipation- prevention of constipation has been discussed with her in detail.   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  - not on chemo at this time - palliative care has been suggested by her oncologist- will go home with hospice - DOES NOT YET WANT TO BE A DNR BUT WANTS LIFE SUPPORT TO BE DISCONTINUED IN 1 WK IF SHE IS NOT ABLE TO BE WEANED FROM VENTILATOR  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   folic acid 1 MG tablet Commonly known as:  FOLVITE Take 1 tablet (1 mg total) by mouth daily.  HYDROmorphone 4 MG tablet Commonly known as:  DILAUDID Take 1 tablet (4 mg total) by mouth every 4 (four) hours as needed for severe pain.   IRON PO Take 1 tablet by mouth every morning.   polyethylene glycol powder powder Commonly known as:  GLYCOLAX/MIRALAX TAKE 34 G (2 DOSES) BY MOUTH DAILY AS NEEDED FOR MILD CONSTIPATION OR MODERATE CONSTIPATION   senna-docusate 8.6-50 MG tablet Commonly known as:  Senokot-S Take 2 tablets by mouth at bedtime.   vitamin B-12 500 MCG tablet Commonly known as:  CYANOCOBALAMIN Take 500 mcg by  mouth daily.   XARELTO 20 MG Tabs tablet Generic drug:  rivaroxaban TAKE 1 TABLET BY MOUTH EVERY DAY WITH SUPPER       Allergies  Allergen Reactions  . Oxycontin [Oxycodone Hcl] Anaphylaxis, Hives and Itching  . Codeine Itching and Nausea And Vomiting     Procedures/Studies: Stent placement in distal sigmoid 8/10  Dg Abd 1 View - Kub  Result Date: 02/12/2016 CLINICAL DATA:  Colonic stent placement; fluoro time 6 min 32 sec EXAM: ABDOMEN - 1 VIEW; DG C-ARM 1-60 MIN-NO REPORT COMPARISON:  None. FINDINGS: Three provided images show placement of a stent from the rectum to the lower sigmoid colon. Contrast outlines the distal colon above the stent. IMPRESSION: Fluoroscopic imaging provided for colonic stent placement. Electronically Signed   By: Lajean Manes M.D.   On: 02/12/2016 13:59   Ct Abdomen Pelvis W Contrast  Result Date: 02/09/2016 CLINICAL DATA:  Diffuse abdominal pain. Abdominal distention. History of colon cancer. Elevated lipase. EXAM: CT ABDOMEN AND PELVIS WITH CONTRAST TECHNIQUE: Multidetector CT imaging of the abdomen and pelvis was performed using the standard protocol following bolus administration of intravenous contrast. CONTRAST:  123mL ISOVUE-300 IOPAMIDOL (ISOVUE-300) INJECTION 61% COMPARISON:  CT abdomen pelvis - 01/16/2016 FINDINGS: Lower chest: Re- demonstrated bilateral pulmonary nodules, the largest of which within the left lower lobe measures 0.9 cm in diameter (image 42, series 4). Minimal subsegmental atelectasis within the bilateral lower lobes. No pleural effusion. Normal heart size.  No pericardial effusion. Hepatobiliary: Normal hepatic contour. Re- demonstrated innumerable hypo attenuating hepatic metastases, progressed in short interval since the 01/2013 examination with dominant mass within subcapsular aspect the posterior segment of the right lobe of the liver now measuring approximately 5.7 x 4.7 cm (image 22, series 2), previously, 4.4 x 3.5 cm, dominant  mass within the posterior subcapsular aspect of the lateral segment of the left lobe of the liver now measuring approximately 4.5 x 4.6 cm (image 34, series 2), previously, 3.6 x 3.4 cm and dominant mass within the central aspect of the right lobe of liver now measuring 5.5 x 4.5 cm (image 20, series 2), previously, 3.8 x 3.2 cm. There is a small amounts of intra-abdominal ascites within the bilateral pericolic gutters. The portal vein remains patent. Normal appearance of the gallbladder given degree distention. No radiopaque gallstones. No intra extrahepatic biliary duct dilatation. Pancreas: Previously questioned peripancreatic stranding is less conspicuous on the present examination. There is homogeneous enhancement of the pancreatic parenchyma. No definitive pancreatic ductal dilatation. No discrete pancreatic mass on this non pancreatic protocol CT scan. Spleen: Surgically absent. A splenule is noted within the left upper abdominal quadrant. Adrenals/Urinary Tract: There is homogeneous enhancement and excretion of the bilateral kidneys. Punctate (approximately 0.8 cm) nonobstructing stone within the inferior pole of the left kidney as well as an additional punctate (approximately 2 mm) nonobstructing stone within the superior pole the left kidney. No definite right-sided renal  stones. Note is again made of mild external rotation of the right kidney. No urinary obstruction or perinephric stranding. Normal appearance of the urinary bladder given degree distention. Stomach/Bowel: Ingested enteric contrast extends to the level of the distal small bowel. Post left hemicolectomy with additional enteric suture line located within the mid upper abdomen. Exophytic soft tissue mass adjacent to the posterior aspect of the surgical anastomosis is grossly unchanged measuring approximately 4.3 x 2.0 cm (coronal image 109, series 5), as is an adjacent exophytic soft tissue mass about the anterior lateral aspect of the  anastomosis measuring approximately 2.1 x 3.3 cm (axial image 73, series 2), however there is now apparent malignant obstruction of the distal anastomosis with upstream distention of the colon. There is diffuse wall thickening involving the colon upstream from the anastomosis. No pneumoperitoneum, pneumatosis or portal venous gas. Normal appearance of the terminal ileum and appendix. There is a small amount of fluid seen throughout the abdomen without definable/drainable fluid collection. Vascular/Lymphatic: Minimal amount of atherosclerotic plaque within a normal caliber abdominal aorta. The major branch vessels of the abdominal aorta appear patent on this non CTA examination. No bulky retroperitoneal, mesenteric, pelvic or inguinal lymphadenopathy. Reproductive: Post hysterectomy. No discrete adnexal lesion. There is a small amount free fluid in the pelvic cul-de-sac. Other: Well-healed midline abdominal incision. Mild diffuse body wall anasarca, most conspicuous about the lateral aspects of the bilateral thighs, left greater than right. Musculoskeletal: No acute or aggressive osseous abnormalities. An intraosseous hemangioma is noted within the T12 vertebral body. Mild-to-moderate multilevel lumbar spine DDD, worse at L4-L5 with disc space height loss, endplate irregularity and sclerosis. IMPRESSION: 1. Post left hemicolectomy with concern for development of malignant obstruction involving the distal colonic anastomosis with wall thickening involving the upstream colon. No evidence of perforation or definable/drainable fluid collection. 2. Progression of hepatic metastatic disease, even compared with recent prior examination performed 01/16/2016 - reference mass within the right lobe of liver now measures 5.5 cm, previously, 3.8 cm. 3. Decreased conspicuity of previously questioned peripancreatic and duodenal stranding. 4. Grossly unchanged pulmonary metastatic disease within the imaged lung bases. Electronically  Signed   By: Sandi Mariscal M.D.   On: 02/09/2016 08:06   Ct Abdomen Pelvis W Contrast  Result Date: 01/16/2016 CLINICAL DATA:  Metastatic colon cancer, anemia, constipation EXAM: CT ABDOMEN AND PELVIS WITH CONTRAST TECHNIQUE: Multidetector CT imaging of the abdomen and pelvis was performed using the standard protocol following bolus administration of intravenous contrast. CONTRAST:  157mL ISOVUE-300 IOPAMIDOL (ISOVUE-300) INJECTION 61% COMPARISON:  11/25/2015 FINDINGS: Lower chest:  Bilateral pulmonary nodules, including: --5 mm left lower lobe nodule (series 4/ image 13), unchanged --5 mm right lower lobe nodule (series 4/ image 21), new --9 mm nodule at the left lung base (series 4/ image 44), previously 6 mm Hepatobiliary: Progression of numerous widespread/multifocal hepatic metastases. Index lesion in the medial segment left hepatic lobe measures 5.6 x 7.7 cm (series 2/ image 26), previously 4.5 x 6.0 cm. Gallbladder is unremarkable. No intrahepatic or extrahepatic ductal dilatation. Pancreas: Fluid/ stranding adjacent to the pancreatic tail (series 2/ image 24), with possible additional stranding along the uncinate process and pancreaticoduodenal groove (series 2/ image 45), correlate for acute pancreatitis. Spleen: Surgically absent. Splenule in the left upper abdomen (series 2/ image 26). Adrenals/Urinary Tract: Adrenal glands are within normal limits. Right kidney is unremarkable, noting an extrarenal pelvis. Left kidney is notable for two nonobstructing calculi measuring up to 7 mm in the lower pole (series 2/image 40).  No ureteral or bladder calculi.  No hydronephrosis. Bladder is within normal limits. Stomach/Bowel: Stomach is within normal limits. Inflammatory changes along the proximal duodenum (series 2/ image 46), suggesting duodenitis, although its possibly secondary to pancreatic inflammatory changes. No evidence of bowel obstruction. Normal appendix (series 2/ image 54). Status post partial  colectomy with suture line in the mid abdomen (series 2/ image 48). Status post partial left hemicolectomy with suture line in the left pelvis (series 2/image 34). Associated 4.1 x 2.9 cm soft tissue mass at the surgical margin (series 2/image 73), previously 4.4 x 3.3 cm, stable versus mildly decreased. Extension posteriorly into the presacral region, where it measures 2.7 x 4.1 cm (series 62/image 70), previously 4.1 x 2.8 cm, unchanged. Extension into the pericolonic soft tissues with associated 3.1 x 2.2 cm soft tissue implant/node (series 2/image 36), previously 3.2 x 2.2 cm, unchanged. Vascular/Lymphatic: No evidence of abdominal aortic aneurysm. Additional 1.8 x 2.3 cm soft tissue implant/left common iliac node (series 2/image 63), previously 2.6 x 2.0 cm, stable versus mildly decreased. Reproductive: Status post hysterectomy. Other: Small volume of pelvic ascites, increased. Musculoskeletal: Degenerative changes of the visualized thoracolumbar spine, most prominent at L5-S1. IMPRESSION: Peripancreatic fluid/inflammatory changes, correlate for acute pancreatitis. Additional inflammatory changes involving the proximal duodenum, suggesting duodenitis, possibly secondary. Status post left hemicolectomy. Associated 4.1 cm soft tissue mass at the surgical margin, stable versus mildly increased. Mass extends posteriorly into the presacral space. Adjacent pericolonic extension anteriorly/inferiorly. Associated 2.3 cm soft tissue implant/left common iliac node, mildly decreased. Progression of multifocal hepatic metastases, including a dominant 7.7 cm mass in the left hepatic lobe, increased. Progression of small pulmonary metastases in the bilateral lower lobes, measuring up to 9 mm. Additional ancillary findings as above. Electronically Signed   By: Julian Hy M.D.   On: 01/16/2016 17:24   Dg Chest Port 1 View  Result Date: 02/09/2016 CLINICAL DATA:  65 year old female with shortness of breath on  exertion. Initial encounter. Current history of metastatic colon cancer. EXAM: PORTABLE CHEST 1 VIEW COMPARISON:  Chest radiographs 10/25/2015 and earlier. FINDINGS: Portable AP semi upright view at 0935 hours. Stable right chest porta cath. Lower lung volumes with increased elevation of the diaphragm. Associated linear and platelike atelectasis at both lung bases. No pneumothorax pulmonary edema. No pleural effusion evident. Stable cardiac size and mediastinal contours. Suggestion of a new 9 mm right upper lobe lung nodule since April (arrow). However, this is well-circumscribed for its size and might be artifact. No other acute pulmonary finding. Paucity of bowel gas in the visualized upper abdomen. IMPRESSION: 1. Possible metastatic right upper lobe pulmonary nodule new since April, but might be artifact (such as something external or on the skin surface). Chest CT (IV contrast preferred) would best evaluate further. 2. Otherwise low lung volumes today with lung base atelectasis. Electronically Signed   By: Genevie Ann M.D.   On: 02/09/2016 10:05   Dg Abd Portable 1v  Result Date: 02/14/2016 CLINICAL DATA:  Lower abdominal pain. Colon cancer with malignant distal last of [place] colonic stricture requiring stent placement. EXAM: PORTABLE ABDOMEN - 1 VIEW COMPARISON:  02/09/2016 abdominal radiograph and CT abdomen/pelvis. FINDINGS: Distal colonic stent is seen in the pelvis. There is narrowing of the midportion of the stent. No dilated small bowel loops or air-fluid levels. Persistent moderate stool in the remnant colon. No evidence of pneumatosis or pneumoperitoneum. Left lower renal 6 mm stone. Hepatomegaly. IMPRESSION: 1. Distal colonic stent in the pelvis, with narrowing of  the midportion of the stent. Persistent moderate stool in the remnant colon, suggesting a degree of persistent distal large bowel obstruction. No dilated small bowel loops. No pneumoperitoneum. 2. Left renal stone. 3. Hepatomegaly.  Electronically Signed   By: [name] M.D.   On: 02/14/2016 08:31   Dg Abd Portable 1v  Result Date: 02/09/2016 CLINICAL DATA:  RIGHT lower quadrant abdominal pain, nausea, vomiting and diarrhea, history colon cancer EXAM: PORTABLE ABDOMEN - 1 VIEW COMPARISON:  CT abdomen and pelvis 02/09/2016 FINDINGS: GI contrast within nondistended small bowel loops in the RIGHT mid abdomen. Gaseous distention of transverse colon. Excreted contrast material within renal collecting systems, ureters, and bladder. Dilatation of LEFT renal collecting system and LEFT ureter. Bones unremarkable. IMPRESSION: Gaseous distention of transverse colon without small bowel dilatation. LEFT hydronephrosis and hydroureter. Electronically Signed   By: Lavonia Dana M.D.   On: 02/09/2016 08:41   Dg C-arm 1-60 Min-no Report  Result Date: 02/12/2016 CLINICAL DATA: colonic stent C-ARM 1-60 MINUTES Fluoroscopy was utilized by the requesting physician.  No radiographic interpretation.       Discharge Exam: Vitals:   02/14/16 2108 02/15/16 0412  BP: (!) 111/45 (!) 101/57  Pulse: (!) 120 (!) 111  Resp: 16 16  Temp: 99.6 F (37.6 C) 98.4 F (36.9 C)   Vitals:   02/14/16 1357 02/14/16 2108 02/15/16 0412 02/15/16 0446  BP: 125/61 (!) 111/45 (!) 101/57   Pulse: (!) 120 (!) 120 (!) 111   Resp: 16 16 16    Temp: 98.6 F (37 C) 99.6 F (37.6 C) 98.4 F (36.9 C)   TempSrc: Oral Oral Oral   SpO2: 100% 100% 100%   Weight:    69.5 kg (153 lb 3.5 oz)  Height:        General: Pt is alert, awake, not in acute distress Cardiovascular: RRR, S1/S2 +, no rubs, no gallops Respiratory: CTA bilaterally, no wheezing, no rhonchi Abdominal: Soft, NT, ND, bowel sounds + Extremities: no edema, no cyanosis    The results of significant diagnostics from this hospitalization (including imaging, microbiology, ancillary and laboratory) are listed below for reference.     Microbiology: No results found for this or any previous visit  (from the past 240 hour(s)).   Labs: BNP (last 3 results) No results for input(s): BNP in the last 8760 hours. Basic Metabolic Panel:  Recent Labs Lab 02/09/16 0506 02/10/16 0320 02/11/16 0542  NA 133* 138 137  K 4.0 3.1* 3.9  CL 101 107 109  CO2 22 22 19*  GLUCOSE 96 67 105*  BUN 8 6 5*  CREATININE 0.39* 0.38* 0.39*  CALCIUM 9.4 8.9 9.0   Liver Function Tests:  Recent Labs Lab 02/09/16 0506  AST 30  ALT 17  ALKPHOS 247*  BILITOT 0.8  PROT 6.3*  ALBUMIN 1.9*    Recent Labs Lab 02/09/16 0506  LIPASE 71*   No results for input(s): AMMONIA in the last 168 hours. CBC:  Recent Labs Lab 02/09/16 0506  02/10/16 0320 02/11/16 0542 02/13/16 0420 02/13/16 1709 02/14/16 0410  WBC 15.0*  --  15.9* 14.5* 13.0* 13.6* 21.7*  NEUTROABS 13.0*  --   --   --   --   --   --   HGB 6.8*  < > 8.1* 8.4* 7.5* 8.9* 8.8*  HCT 21.3*  < > 25.4* 25.9* 23.5* 27.2* 27.1*  MCV 76.9*  --  78.4 78.5 78.9 79.3 79.7  PLT 730*  --  686* 520* 431* 412*  417*  < > = values in this interval not displayed. Cardiac Enzymes: No results for input(s): CKTOTAL, CKMB, CKMBINDEX, TROPONINI in the last 168 hours. BNP: Invalid input(s): POCBNP CBG: No results for input(s): GLUCAP in the last 168 hours. D-Dimer No results for input(s): DDIMER in the last 72 hours. Hgb A1c No results for input(s): HGBA1C in the last 72 hours. Lipid Profile No results for input(s): CHOL, HDL, LDLCALC, TRIG, CHOLHDL, LDLDIRECT in the last 72 hours. Thyroid function studies No results for input(s): TSH, T4TOTAL, T3FREE, THYROIDAB in the last 72 hours.  Invalid input(s): FREET3 Anemia work up No results for input(s): VITAMINB12, FOLATE, FERRITIN, TIBC, IRON, RETICCTPCT in the last 72 hours. Urinalysis    Component Value Date/Time   COLORURINE YELLOW 02/09/2016 0937   APPEARANCEUR CLEAR 02/09/2016 0937   LABSPEC >1.046 (H) 02/09/2016 0937   LABSPEC 1.020 12/15/2015 1430   PHURINE 6.5 02/09/2016 0937    GLUCOSEU NEGATIVE 02/09/2016 0937   GLUCOSEU Negative 12/15/2015 1430   HGBUR NEGATIVE 02/09/2016 0937   BILIRUBINUR NEGATIVE 02/09/2016 0937   BILIRUBINUR Negative 12/15/2015 1430   KETONESUR NEGATIVE 02/09/2016 0937   PROTEINUR NEGATIVE 02/09/2016 0937   UROBILINOGEN 0.2 12/15/2015 1430   NITRITE NEGATIVE 02/09/2016 0937   LEUKOCYTESUR NEGATIVE 02/09/2016 0937   LEUKOCYTESUR Trace 12/15/2015 1430   Sepsis Labs Invalid input(s): PROCALCITONIN,  WBC,  LACTICIDVEN Microbiology No results found for this or any previous visit (from the past 240 hour(s)).   Time coordinating discharge: Over 30 minutes  SIGNED:   Debbe Odea, MD  [facility] 02/15/2016, 12:02 PM Pager   If 7PM-7AM, please contact night-coverage [email or web address] Password [email or web address]

## 2016-02-15 NOTE — Plan of Care (Signed)
Problem: Health Behavior/Discharge Planning: Goal: Ability to manage health-related needs will improve Outcome: Completed/Met Date Met: 02/15/16 EDUCATION PROVIDED REGARDING CONTINUED PLAN OF CARE.  Problem: Fluid Volume: Goal: Ability to maintain a balanced intake and output will improve Outcome: Completed/Met Date Met: 02/15/16 DISCUSSED THE IMPORTANCE OF NUTRITION AND HYDRATION.

## 2016-02-15 NOTE — Progress Notes (Signed)
Notified by Joen Laura of family request for Hospice and [facility] services at home after discharge. Chart and patient information currently under review to confirm hospice eligibility.   Spoke with patient and daughter at bedside to initiate education related to hospice philosophy, services and team approach to care. Family verbalized understanding of the information provided. Per discussion, plan is for discharge to home by personal vehicle with husband today.   Patient will need prescriptions for discharge comfort medications.   DME needs discussed and family requested hospital bed with 1/2 rails for delivery to the home today. [facility] to contact family to arrange time for delivery.  Patient denies needing this delivered prior to hospital discharge.  [facility] aware of the above.  Completed discharge summary will need to be faxed to [facility] at [phone] when final.   [facility] information and contact numbers have been given to patient during visit.   Please call with any questions.  Thank You,  Freddi Starr RN, [facility] Liaison  [phone]

## 2016-02-16 ENCOUNTER — Telehealth: Payer: Self-pay | Admitting: *Deleted

## 2016-02-16 ENCOUNTER — Telehealth: Payer: Self-pay

## 2016-02-16 LAB — TYPE AND SCREEN
ABO/RH(D): AB POS
ANTIBODY SCREEN: NEGATIVE
UNIT DIVISION: 0

## 2016-02-16 NOTE — Telephone Encounter (Signed)
Returned call to pt & informed to resume xarelto at 20 mg daily (same dose) per Dr Burr Medico.  She was d/c to Hospice.  Pt does want to come back in to see Dr Burr Medico.  Order placed to return in 3 wks.

## 2016-02-16 NOTE — Telephone Encounter (Signed)
Received message from [facility] @ [place] wanting to know if Dr. Burr Medico will be the attending for hospice;  Pt has first appt with hospice today at 3 pm.  Dr. Burr Medico notified. Spoke with Evette and informed her that Dr. Burr Medico will be the attending;  Hospice standing orders to be activated, and for hospice providers to assist with symptom management as needed.  Evette voiced undertanding.

## 2016-02-16 NOTE — Telephone Encounter (Signed)
Received vm call from pt asking what she is supposed to do with her xarelto.  She thinks she is supposed to cut down on dose & has been off since in the hospital.  She was d/c on Sun.  She is waiting for call back. [phone].  Note to Dr Burr Medico.

## 2016-02-16 NOTE — Telephone Encounter (Signed)
Evette with Hospice called asking if Dr Burr Medico will be the attending for the pt. Pt went home from hospital with hospice yesterday. Evette's number is [phone]

## 2016-02-17 ENCOUNTER — Other Ambulatory Visit: Payer: Self-pay | Admitting: *Deleted

## 2016-02-17 ENCOUNTER — Telehealth: Payer: Self-pay | Admitting: *Deleted

## 2016-02-17 DIAGNOSIS — C7931 Secondary malignant neoplasm of brain: Principal | ICD-10-CM

## 2016-02-17 DIAGNOSIS — C189 Malignant neoplasm of colon, unspecified: Secondary | ICD-10-CM

## 2016-02-17 MED ORDER — NYSTATIN 100000 UNIT/ML MT SUSP: 5.0000 mL | Freq: Four times a day (QID) | OROMUCOSAL | 0 refills | Status: AC

## 2016-02-17 MED ORDER — POTASSIUM CHLORIDE CRYS ER 20 MEQ PO TBCR: 20.0000 meq | EXTENDED_RELEASE_TABLET | Freq: Every day | ORAL | 1 refills | Status: DC

## 2016-02-17 MED ORDER — PROCHLORPERAZINE MALEATE 10 MG PO TABS
10.0000 mg | ORAL_TABLET | Freq: Four times a day (QID) | ORAL | 0 refills | Status: AC | PRN
Start: 2016-02-17 — End: ?

## 2016-02-17 MED ORDER — FUROSEMIDE 20 MG PO TABS: 20.0000 mg | ORAL_TABLET | ORAL | 1 refills | Status: AC

## 2016-02-17 NOTE — Telephone Encounter (Signed)
Received call from [place], [place] @ [facility] requesting several medications for pt : 1.    Pt has severe thrush in mouth. 2.    Pt has bilateral 2+ edema in lower extremities. 3.    Pt has more nausea. Dr. Burr Medico notified.   Spoke with Junie Panning and informed her that scripts will be sent in to pt's pharmacy for above problems.  Erin voiced understanding and stated she would update pt's chart. Erin's     Phone     [phone].

## 2016-02-26 ENCOUNTER — Telehealth: Payer: Self-pay | Admitting: Hematology

## 2016-02-26 ENCOUNTER — Telehealth: Payer: Self-pay | Admitting: *Deleted

## 2016-02-26 ENCOUNTER — Other Ambulatory Visit: Payer: Self-pay | Admitting: *Deleted

## 2016-02-26 DIAGNOSIS — C7931 Secondary malignant neoplasm of brain: Principal | ICD-10-CM

## 2016-02-26 DIAGNOSIS — C189 Malignant neoplasm of colon, unspecified: Secondary | ICD-10-CM

## 2016-02-26 DIAGNOSIS — R1084 Generalized abdominal pain: Secondary | ICD-10-CM

## 2016-02-26 MED ORDER — HYDROMORPHONE HCL 4 MG PO TABS: 4.0000 mg | ORAL_TABLET | ORAL | 0 refills | Status: DC | PRN

## 2016-02-26 MED ORDER — POTASSIUM CHLORIDE 20 MEQ/15ML (10%) PO SOLN: 20.0000 meq | Freq: Every day | ORAL | 0 refills | Status: AC

## 2016-02-26 NOTE — Telephone Encounter (Signed)
Received call from [place], [place] @ [facility] re:  Pt leg swelling has gone down with Lasix.  Erin requested if Kdur could be changed to liquid form for easier swallowing.  Pt also needed refill of Dilaudid.   Spoke with Junie Panning, and informed her meds will be refilled.  Gave Erin pt's appts for 03/10/16.   Pt was also informed of appts by [name], lead scheduler today. Erin's    Phone      [phone].

## 2016-02-26 NOTE — Telephone Encounter (Signed)
Spoke with patient lab/fu 9/6 @ 11:45 am. Appointments scheduled per desk nurse and pending new scheduled message being sent for 3 wk f/u (from 8/14).

## 2016-03-04 ENCOUNTER — Other Ambulatory Visit: Payer: Self-pay | Admitting: *Deleted

## 2016-03-04 ENCOUNTER — Telehealth: Payer: Self-pay | Admitting: *Deleted

## 2016-03-04 ENCOUNTER — Encounter: Payer: Self-pay | Admitting: *Deleted

## 2016-03-04 DIAGNOSIS — C189 Malignant neoplasm of colon, unspecified: Secondary | ICD-10-CM

## 2016-03-04 MED ORDER — MORPHINE SULFATE (CONCENTRATE) 20 MG/ML PO SOLN: 10.0000 mg | Freq: Four times a day (QID) | ORAL | 0 refills | Status: AC | PRN

## 2016-03-04 MED ORDER — FENTANYL 12 MCG/HR TD PT72: 12.5000 ug | MEDICATED_PATCH | TRANSDERMAL | 0 refills | Status: AC

## 2016-03-04 MED ORDER — MORPHINE SULFATE 10 MG/5ML PO SOLN: 15.0000 mg | Freq: Four times a day (QID) | ORAL | 0 refills | Status: DC | PRN

## 2016-03-04 NOTE — Telephone Encounter (Signed)
Received call from [place], [place] with [facility] wanting to inform Dr. Burr Medico re:  Pt has been taking pain med Dilaudid every 4 hours as needed for pain.  However, pt has been very confused with Dilaudid and sleepy.  Pt had unwitnessed  fall in the bathroom 2 days ago - pt was found by husband.  Pt also has more nausea/vomiting with Dilaudid.   Junie Panning would like to know if pt can be switched to Fentanyl patch - so pt would not have to take too many po meds;  Pt also takes Nystatin for yeast in mouth.  [facility] [address] [facility] per hospice nurse request. Erin[facility]    Phone      [phone].

## 2016-03-04 NOTE — Telephone Encounter (Signed)
Received call from [place], pharmacist @ [facility] [address] [place] re:  Pharmacy only stocks Roxanol 20 mg/ml.   Dr. Burr Medico notified.  Refaxed script with Roxanol 20 mg/ml - sig.   10 -  15 mg every 6 hours as needed for breakthrough pain.   Also faxed script for Fentanyl 12.5 mcg to pharmacy.

## 2016-03-10 ENCOUNTER — Encounter: Payer: Self-pay | Admitting: Hematology

## 2016-03-10 ENCOUNTER — Ambulatory Visit (HOSPITAL_BASED_OUTPATIENT_CLINIC_OR_DEPARTMENT_OTHER): Payer: Medicare Other | Admitting: Hematology

## 2016-03-10 ENCOUNTER — Ambulatory Visit: Payer: Medicare Other

## 2016-03-10 ENCOUNTER — Other Ambulatory Visit (HOSPITAL_BASED_OUTPATIENT_CLINIC_OR_DEPARTMENT_OTHER)

## 2016-03-10 ENCOUNTER — Ambulatory Visit (HOSPITAL_BASED_OUTPATIENT_CLINIC_OR_DEPARTMENT_OTHER): Payer: Medicare Other

## 2016-03-10 ENCOUNTER — Ambulatory Visit (HOSPITAL_COMMUNITY)
Admission: RE | Admit: 2016-03-10 | Discharge: 2016-03-10 | Disposition: A | Discharge status: Home / Self Care | Source: Ambulatory Visit | Attending: Hematology | Admitting: Hematology

## 2016-03-10 VITALS — BP 103/47 | HR 132 | Temp 98.3°F | Resp 17 | Ht 60.0 in | Wt 147.0 lb

## 2016-03-10 DIAGNOSIS — C19 Malignant neoplasm of rectosigmoid junction: Secondary | ICD-10-CM

## 2016-03-10 DIAGNOSIS — D638 Anemia in other chronic diseases classified elsewhere: Secondary | ICD-10-CM

## 2016-03-10 DIAGNOSIS — D649 Anemia, unspecified: Secondary | ICD-10-CM | POA: Insufficient documentation

## 2016-03-10 DIAGNOSIS — I2699 Other pulmonary embolism without acute cor pulmonale: Secondary | ICD-10-CM | POA: Diagnosis not present

## 2016-03-10 DIAGNOSIS — D6481 Anemia due to antineoplastic chemotherapy: Secondary | ICD-10-CM

## 2016-03-10 DIAGNOSIS — C787 Secondary malignant neoplasm of liver and intrahepatic bile duct: Secondary | ICD-10-CM

## 2016-03-10 DIAGNOSIS — Z95828 Presence of other vascular implants and grafts: Secondary | ICD-10-CM

## 2016-03-10 DIAGNOSIS — C7931 Secondary malignant neoplasm of brain: Secondary | ICD-10-CM | POA: Diagnosis not present

## 2016-03-10 DIAGNOSIS — Z5111 Encounter for antineoplastic chemotherapy: Secondary | ICD-10-CM | POA: Diagnosis not present

## 2016-03-10 LAB — COMPREHENSIVE METABOLIC PANEL
ALT: 69 U/L — ABNORMAL HIGH (ref 0–55)
ANION GAP: 11 meq/L (ref 3–11)
AST: 92 U/L — ABNORMAL HIGH (ref 5–34)
Albumin: 1.4 g/dL — ABNORMAL LOW (ref 3.5–5.0)
Alkaline Phosphatase: 688 U/L — ABNORMAL HIGH (ref 40–150)
BILIRUBIN TOTAL: 1.7 mg/dL — AB (ref 0.20–1.20)
BUN: 8.1 mg/dL (ref 7.0–26.0)
CHLORIDE: 101 meq/L (ref 98–109)
CO2: 21 meq/L — AB (ref 22–29)
Calcium: 10.2 mg/dL (ref 8.4–10.4)
Creatinine: 0.6 mg/dL (ref 0.6–1.1)
Glucose: 118 mg/dl (ref 70–140)
Potassium: 4.5 mEq/L (ref 3.5–5.1)
Sodium: 134 mEq/L — ABNORMAL LOW (ref 136–145)
TOTAL PROTEIN: 5.9 g/dL — AB (ref 6.4–8.3)

## 2016-03-10 LAB — CBC & DIFF AND RETIC
BASO%: 0.1 % (ref 0.0–2.0)
BASOS ABS: 0 10*3/uL (ref 0.0–0.1)
EOS ABS: 0 10*3/uL (ref 0.0–0.5)
EOS%: 0.1 % (ref 0.0–7.0)
HCT: 20.7 % — ABNORMAL LOW (ref 34.8–46.6)
HGB: 6.5 g/dL — CL (ref 11.6–15.9)
LYMPH%: 5.2 % — AB (ref 14.0–49.7)
MCH: 24.6 pg — ABNORMAL LOW (ref 25.1–34.0)
MCHC: 31.4 g/dL — AB (ref 31.5–36.0)
MCV: 78.4 fL — AB (ref 79.5–101.0)
MONO#: 1 10*3/uL — ABNORMAL HIGH (ref 0.1–0.9)
MONO%: 6.1 % (ref 0.0–14.0)
NEUT#: 15.1 10*3/uL — ABNORMAL HIGH (ref 1.5–6.5)
NEUT%: 88.5 % — ABNORMAL HIGH (ref 38.4–76.8)
PLATELETS: 533 10*3/uL — AB (ref 145–400)
RBC: 2.64 10*6/uL — AB (ref 3.70–5.45)
RDW: 22.5 % — ABNORMAL HIGH (ref 11.2–14.5)
WBC: 17.1 10*3/uL — ABNORMAL HIGH (ref 3.9–10.3)
lymph#: 0.9 10*3/uL (ref 0.9–3.3)
nRBC: 0 % (ref 0–0)

## 2016-03-10 LAB — FERRITIN

## 2016-03-10 LAB — IRON AND TIBC: TIBC: 85 ug/dL — AB (ref 236–444)

## 2016-03-10 LAB — PREPARE RBC (CROSSMATCH)

## 2016-03-10 MED ORDER — RIVAROXABAN 15 MG PO TABS: 15.0000 mg | ORAL_TABLET | Freq: Every day | ORAL | 2 refills | Status: AC

## 2016-03-10 MED ORDER — ACETAMINOPHEN 325 MG PO TABS
ORAL_TABLET | ORAL | Status: AC
  Filled 2016-03-10: qty 2

## 2016-03-10 MED ORDER — ACETAMINOPHEN 325 MG PO TABS
650.0000 mg | ORAL_TABLET | Freq: Once | ORAL | Status: AC
  Administered 2016-03-10: 650 mg via ORAL

## 2016-03-10 MED ORDER — SODIUM CHLORIDE 0.9% FLUSH
10.0000 mL | INTRAVENOUS | Status: AC | PRN
  Administered 2016-03-10: 10 mL
  Filled 2016-03-10: qty 10

## 2016-03-10 MED ORDER — SODIUM CHLORIDE 0.9 % IJ SOLN
10.0000 mL | INTRAMUSCULAR | Status: DC | PRN
  Administered 2016-03-10: 10 mL via INTRAVENOUS
  Filled 2016-03-10: qty 10

## 2016-03-10 MED ORDER — HEPARIN SOD (PORK) LOCK FLUSH 100 UNIT/ML IV SOLN
500.0000 [IU] | Freq: Once | INTRAVENOUS | Status: AC | PRN
  Administered 2016-03-10: 500 [IU] via INTRAVENOUS
  Filled 2016-03-10: qty 5

## 2016-03-10 MED ORDER — HEPARIN SOD (PORK) LOCK FLUSH 100 UNIT/ML IV SOLN
500.0000 [IU] | Freq: Every day | INTRAVENOUS | Status: AC | PRN
  Administered 2016-03-10: 500 [IU]
  Filled 2016-03-10: qty 5

## 2016-03-10 MED ORDER — DIPHENHYDRAMINE HCL 25 MG PO CAPS
ORAL_CAPSULE | ORAL | Status: AC
  Filled 2016-03-10: qty 1

## 2016-03-10 MED ORDER — SODIUM CHLORIDE 0.9 % IV SOLN
250.0000 mL | Freq: Once | INTRAVENOUS | Status: AC
  Administered 2016-03-10: 250 mL via INTRAVENOUS

## 2016-03-10 MED ORDER — DIPHENHYDRAMINE HCL 25 MG PO TABS
25.0000 mg | ORAL_TABLET | Freq: Once | ORAL | Status: AC
  Administered 2016-03-10: 25 mg via ORAL
  Filled 2016-03-10: qty 1

## 2016-03-10 NOTE — Patient Instructions (Signed)
Blood Transfusion, Care After  These instructions give you information about caring for yourself after your procedure. Your doctor may also give you more specific instructions. Call your doctor if you have any problems or questions after your procedure.   HOME CARE   Take medicines only as told by your doctor. Ask your doctor if you can take an over-the-counter pain reliever if you have a fever or headache a day or two after your procedure.   Return to your normal activities as told by your doctor.  GET HELP IF:    You develop redness or irritation at your IV site.   You have a fever, chills, or a headache that does not go away.   Your pee (urine) is darker than normal.   Your urine turns:    Pink.    Red.    Brown.   The white part of your eye turns yellow (jaundice).   You feel weak after doing your normal activities.  GET HELP RIGHT AWAY IF:    You have trouble breathing.   You have fever and chills and you also have:    Anxiety.    Chest or back pain.    Flushed or pink skin.    Clammy or sweaty skin.    A fast heartbeat.    A sick feeling in your stomach (nausea).     This information is not intended to replace advice given to you by your health care provider. Make sure you discuss any questions you have with your health care provider.     Document Released: 07/12/2014 Document Reviewed: 07/12/2014  Elsevier Interactive Patient Education 2016 Elsevier Inc.

## 2016-03-10 NOTE — Progress Notes (Signed)
[facility] ONCOLOGY OFFICE PROGRESS NOTE   DIAGNOSIS: Metastatic colon cancer to liver and peritoneum  Chief complaint: Follow-up colon cancer  CURRENT TREATMENT:  Hospice care    Oncology History   Colorectal cancer, stage IV, obstructing s/p colonic diversion   Staging form: Colon and Rectum, AJCC 7th Edition     Clinical: Stage IVA (TX, N0, M1a) - Signed by Concha Norway, MD on 09/15/2013       Colorectal cancer, stage IV, obstructing s/p stent x2, ostomy, then [facility] 03/06/2015   05/22/2013 - 05/27/2013 Hospital Admission    A/w abdominal distention and constipation for 2 days CT scan of her abdomen and pelvis showed area of bowel wall thickening at the rectosigmoid junction concerning for malignancy.      05/22/2013 Imaging    CT of Abdomen: bowel wall thickening with shouldering of the proximal and distal margins, involving therectosigmoid junction, supicous liver spleen mets. Colonsocopy recommended.       05/23/2013 Tumor Marker    CEA 225.7      05/24/2013 Imaging    MRI of abdomen: 3.7 x 4.4 cm enhancing lesion along the superior spleen, indeterminate but worrisome for metastasis.2.7 x 3.9 cm enhancing lesion in the medial segment left hepatic lobe,       05/25/2013 Pathology Results    Diagnosis Rectum, biopsy, proximal - INVASIVE ADENOCARCINOMA.      05/25/2013 Procedure    Flex sig: (Dr. Ardis Hughs). 5-6 cm obstructing rectosigmoid mass distal end 10 cm from anal verge; mass biopsed and then stented 9 cm long, 22 cm diameter uncovered SEM.       05/25/2013 Initial Diagnosis    Colorectal cancer, stage IV      06/25/2013 Procedure    R port a cath placement.      07/09/2013 - 08/20/2013 Chemotherapy    FOLFOX q 2 weeks started.  Not elgible for clinical trial and bevacimab based on stent and high risk for perforation. She received a total of 4 cycles.       07/30/2013 Tumor Marker    KRAS positive.  p53, APC identified. Mismatch Repair (MMR)  preserved.       08/06/2013 Adverse Reaction    Complains of darkening of her hands with peeling.       08/27/2013 Family History    She was seen by Genetic couselor.  She declined testing today, but will call and reschedule an appointment should she decide to pursue testing.      09/07/2013 Imaging    CT abdomen: Mixed response to therapy. Response to therapy of hepatic metastasis. right hepatic hemangiomas are again identified. Other too small to characterize liver lesions are felt to be similar but are indeterminate.. Enlargement of a splenic lesions      09/13/2013 - 06/10/2014 Chemotherapy    Second line chemo FOLFIRI for a total of 16 cycles       09/13/2013 Tumor Marker    CEA 33.5      09/13/2013 Treatment Plan Change    Reviewed scans consistent with mixed response.  Switch to FOLFIRI, she received a total of 16 cycles, with a few breaks due to hospitalization and chemo holiday.       10/29/2013 Adverse Reaction    Patient reports intense abdominal cramping lasting the first few days on chemotherapy.  Starting Hyoscyamine 0.125 mg q 6 hours prn.       11/15/2013 - 11/15/2013 Hospital Admission    Patient presented to ED with migrated  stent.  Stent retrieved by Dr. Ardis Hughs.  Imaging negative for perforation.   Discussed in conference with referral to Dr. Zella Richer made.       11/23/2013 Imaging    CT Chest. 1. Stable left lower lobe 5 mm pulmonary nodule. 2. Interval increase in size splenic mass is concerning formetastatic lesion.3. Interval calcification of the left hepatic lobe metastasis. Noevidence of active residual disease by CT.      07/01/2014 Imaging    CT restaging scan showed stable disease, improved liver lesion.       07/16/2014 - 10/24/2014 Chemotherapy    maintenance chemo with capacitain 1020m/m2 (18070m bid started, dose decresed to 150058mid on C1D8 due to tolerance issue. Pt declined surgery. Chemo held due to hospitalization and disease progression.         08/16/2014 - 08/20/2014 Hospital Admission    She was admitted for bowel obstruction from sigmoid colon mass. She declined surgery, and had sigmoid colon stent placement. Her symptoms resolved afterwards.      10/25/2014 - 10/31/2014 Hospital Admission    Admitted for bowel obstruction, CT scan showed sigmoid colon tumor growth through the stent, pt underwent Transverse loop colostomy on 10/28/2014.       11/13/2014 - 11/18/2014 Hospital Admission    she was admitted for transverse loop colostomy prolapse, managed conservatively. CT chest showed RLL PE, discharged home on Xarelto       01/03/2015 - 01/07/2015 Hospital Admission    palliative repair of loop colostomy prolapse with partial colectomy 01/03/15      03/05/2015 Imaging    CT chest, abdomen and pelvis showed known colorectal  Them, multiple new liver metastasis,. Noticed enhancing splenic lesion continues to enlarge, nonobstructive calculus in the left renal collecting system.      03/06/2015 Surgery    Laparoscopic low anterior rectosigmoid to resection, splenectomy, takedown loop colostomy, vaginal cuff peritoneal biopsy, colon polypectomyx4       03/06/2015 Pathology Results    Well differentiated adenocarcinoma in rectosigmoid colon, 32 lymph nodes were negative, vaginal cuff biopsy was positive for metastatic adenocarcinoma, [place] with plasma cell infiltrate and fibrosis, no metastatic carcinoma.      09/12/2015 - 09/17/2015 Hospital Admission    Pt presented with b/l leg weakness, was admitted for brain metastasis       09/13/2015 Imaging    Brain MRI with and without contrast showed a 1.7 cm left frontoparietal mass with moderate vasogenic edema, consistent with solitary metastasis      09/18/2015 - 09/18/2015 Radiation Therapy    SRS to solitary brain met       02/09/2016 - 02/15/2016 Hospital Admission    Patient was admitted for biopsy ejection and worsening abdominal pain. She underwent colonic stent placement, and was  discharged home with home hospice.      CURRENT THERAPY:   supportive care   INTERVAL HISTORY:  [name] [name] 65o. female with a history of Stage IV colon cancer is here for follow-up. She was admitted to hospital on months ago for bowel obstruction symptoms and abdominal pain. CT scan showed bowel obstruction from her recurrent colon cancer, and she finally underwent chronic stent placement. She was discharged home with hospice care, and dilated. The hospice nurse called me last week to adjust her pain medication, but she has not a pickup the new prescription yet. She came in today for follow-up. She has been very fatigued lately, able to do self-care, such as dressing and showering, and not  much other activities. She walks with a walker. She states her pain has been mild to moderate, she takes dilaudid twice a day. Her family reports significant drowsiness and hallucination from dilaudid. Her appetite is moderate, she eats small meals, her weight has been stable. She has bilateral lower extremity swelling, she is taking Xarelto 20 mg daily, history sometimes being black, no overt GI bleeding.  Past Medical History:  Diagnosis Date  . Anemia    hx of   . Colon cancer ([place])    dx'd 2014  . Heart murmur    ? as a child   . Pulmonary embolism ([place]) 11/2014   . S/P radiation therapy 09/18/15   SRS left frontoparietal 20Gy    ALLERGIES:  is allergic to oxycontin [oxycodone hcl] and codeine.  MEDICATIONS: has a current medication list which includes the following prescription(s): acetaminophen, folic acid, furosemide, iron, nystatin, polyethylene glycol powder, potassium chloride, prochlorperazine, senna-docusate, vitamin b-12, fentanyl, morphine, and rivaroxaban, and the following Facility-Administered Medications: heparin lock flush and sodium chloride flush.  SURGICAL HISTORY:  Past Surgical History:  Procedure Laterality Date  . ABDOMINAL HYSTERECTOMY  2005  . BOWEL RESECTION   03/06/2015   Procedure: LOW ANTERIOR BOWEL RESECTION, rigid proctoscopy;  Surgeon: Michael Boston, MD;  Location: [facility];  Service: General;;  . [place]  . COLON RESECTION N/A 10/28/2014   Procedure: Transverse loop colostomy;  Surgeon: [name], MD;  Location: [facility];  Service: General;  Laterality: N/A;  . COLON RESECTION N/A 03/06/2015   Procedure: LAPAROSCOPIC takedown loop colostomy ;  Surgeon: Michael Boston, MD;  Location: [facility];  Service: General;  Laterality: N/A;  . COLONIC STENT PLACEMENT N/A 05/25/2013   Procedure: COLONIC STENT PLACEMENT;  Surgeon: [name], MD;  Location[facility];  Service: Endoscopy;  Laterality: N/A;  . COLONIC STENT PLACEMENT N/A 08/19/2014   Procedure: COLONIC STENT PLACEMENT;  Surgeon: Gatha Mayer, MD;  [facility];  Service: Endoscopy;  Laterality: N/A;  . COLONIC STENT PLACEMENT N/A 08/19/2014   Procedure: COLONIC STENT PLACEMENT;  Surgeon: Gatha Mayer, MD;  [facility];  Service: Endoscopy;  Laterality: N/A;  . COLOSTOMY REVISION N/A 01/03/2015   Procedure: REVISION OF TRANSVERSE LOOP COLOSTOMY WITH PARTIAL  COLECTOMY;  Surgeon: [name], MD;  [facility];  Service: General;  Laterality: N/A;  . FLEXIBLE SIGMOIDOSCOPY N/A 05/25/2013   Procedure: Beryle Quant;  Surgeon: [name], MD;  Location: [facility];  Service: Endoscopy;  Laterality: N/A;  needs floro  . FLEXIBLE SIGMOIDOSCOPY N/A 08/19/2014   Procedure: FLEXIBLE SIGMOIDOSCOPY;  Surgeon: Gatha Mayer, MD;  [facility];  Service: Endoscopy;  Laterality: N/A;  . FLEXIBLE SIGMOIDOSCOPY N/A 08/19/2014   Procedure: FLEXIBLE SIGMOIDOSCOPY;  Surgeon: Gatha Mayer, MD;  [facility];  Service: Endoscopy;  Laterality: N/A;  . FLEXIBLE SIGMOIDOSCOPY N/A 02/12/2016   Procedure: FLEXIBLE SIGMOIDOSCOPY;  Surgeon: Gatha Mayer, MD;  [facility];  Service: Endoscopy;  Laterality: N/A;  . LAPAROSCOPIC  LOW ANTERIOR RESECTION  03/06/2015   03/06/2015  . LAPAROSCOPIC SPLENECTOMY  03/06/2015  . POLYPECTOMY  03/06/2015   Procedure: transverse POLYPECTOMY x 4;  Surgeon: Michael Boston, MD;  Location[facility];  Service: General;;  . PORTACATH PLACEMENT Right 06/25/2013   Procedure: ULTRA SOUND GUIDED INSERTION PORT-A-CATH;  Surgeon: [name], MD;  Location: [facility];  Service: General;  Laterality: Right;  . SPLENECTOMY, TOTAL N/A 03/06/2015   Procedure:  SPLENECTOMY;  Surgeon: Michael Boston, MD;  [facility];  Service: General;  Laterality: N/A;    REVIEW OF SYSTEMS:   Constitutional: Denies fevers, chills or abnormal weight loss Eyes: Denies blurriness of vision Ears, nose, mouth, throat, and face: Denies mucositis or sore throat Respiratory: Denies cough, dyspnea or wheezes Cardiovascular: Denies palpitation, chest discomfort or lower extremity swelling Gastrointestinal:  Denies nausea, heartburn or change in bowel habits Skin: Denies abnormal skin rashes Lymphatics: Denies new lymphadenopathy or easy bruising Neurological:Denies numbness, tingling or new weaknesses Behavioral/Psych: Mood is stable, no new changes  All other systems were reviewed with the patient and are negative.  PHYSICAL EXAMINATION: ECOG PERFORMANCE STATUS: 1-2 Blood pressure (!) 103/47, pulse (!) 132, temperature 98.3 F (36.8 C), temperature source Oral, resp. rate 17, height 5' (1.524 m), weight 147 lb (66.7 kg), SpO2 99 %. GENERAL:alert, no distress and comfortable; well-developed, well nourished.  SKIN: skin color, texture, turgor are normal, no rashes or significant lesions; + R Port, hyperpigmentation of her hands.  EYES: normal, Conjunctiva are pink and non-injected, sclera clear  OROPHARYNX:no exudate, no erythema and lips, buccal mucosa  NECK: supple, thyroid normal size, non-tender, without nodularity  LYMPH: no palpable lymphadenopathy in the cervical, axillary or supraclavicular    LUNGS: clear to auscultation and percussion with normal breathing effort  HEART: Tachycardic with regular rhythm and no murmurs and no lower extremity edema  ABDOMEN:abdomen soft, non-tender and normal bowel sounds. Well-healed surgical scar in the right upper quadrant.  Bowel sounds normal.   Musculoskeletal:no cyanosis of digits and no clubbing  NEURO: alert & oriented x 3 with fluent speech, slightly weakness on the right lower extremity, she is able to walk independently without any assistance EXTREMITIES: no leg swollen.  Labs:  CBC Latest Ref Rng & Units 03/10/2016 02/14/2016 02/13/2016  WBC 3.9 - 10.3 10e3/uL 17.1(H) 21.7(H) 13.6(H)  Hemoglobin 11.6 - 15.9 g/dL 6.5 Repeated and Verified(LL) 8.8(L) 8.9(L)  Hematocrit 34.8 - 46.6 % 20.7(L) 27.1(L) 27.2(L)  Platelets 145 - 400 10e3/uL 533(H) 417(H) 412(H)   CMP Latest Ref Rng & Units 03/10/2016 02/11/2016 02/10/2016  Glucose 70 - 140 mg/dl 118 105(H) 67  BUN 7.0 - 26.0 mg/dL 8.1 5(L) 6  Creatinine 0.6 - 1.1 mg/dL 0.6 0.39(L) 0.38(L)  Sodium 136 - 145 mEq/L 134(L) 137 138  Potassium 3.5 - 5.1 mEq/L 4.5 3.9 3.1(L)  Chloride 101 - 111 mmol/L - 109 107  CO2 22 - 29 mEq/L 21(L) 19(L) 22  Calcium 8.4 - 10.4 mg/dL 10.2 9.0 8.9  Total Protein 6.4 - 8.3 g/dL 5.9(L) - -  Total Bilirubin 0.20 - 1.20 mg/dL 1.70(H) - -  Alkaline Phos 40 - 150 U/L 688(H) - -  AST 5 - 34 U/L 92(H) - -  ALT 0 - 55 U/L 69(H) - -    PATHOLOGY REPORT Diagnosis 03/06/2015 1. Vagina, biopsy, vaginal cuff - METASTATIC ADENOCARCINOMA, SEE COMMENT. 2. Colon, polyp(s), transverse - TUBULAR ADENOMA WITH HIGH GRADE DYSPLASIA (X2), SEE COMMENT. - TUBULAR ADENOMA (X2). - NO INVASIVE CARCINOMA IDENTIFIED. 3. Colon, colostomy stoma - FINDINGS CONSISTENT WITH COLOSTOMY STOMA. - NO MALIGNANCY IDENTIFIED. 4. Colon, segmental resection for tumor, rectosigmoid cancer - INVASIVE ADENOCARCINOMA, WELL DIFFERENTIATED, SPANNING 7.6 CM. - TUMOR INVADES THROUGH SEROSA. - RESECTION  MARGINS ARE NEGATIVE. - THIRTY-TWO OF THIRTY-TWO LYMPH NODES NEGATIVE FOR CARCINOMA (0/32). - SEE ONCOLOGY TABLE. 5. Colon, resection margin (donut), proximal - BENIGN COLONIC MUCOSA. - NO DYSPLASIA OR MALIGNANCY. 6. Colon, resection margin (donut), distal - BENIGN  COLONIC MUCOSA. - NO DYSPLASIA OR MALIGNANCY. 7. Spleen, possible metastasis - SPLEEN WITH PLASMA CELL INFILTRATE AND FIBROSIS, SEE COMMENT. - NO METASTATIC CARCINOMA IDENTIFIED.  Pathologic Staging: ypT4a, ypN0, ypM1b   MOLECULAR FEATURES ([facility]):   RADIOGRAPHIC STUDIES: CT abdomen and pelvis with contrast 11/25/2015 IMPRESSION: 1. Recurrence of malignancy at the site of surgery at the mid sigmoid colon, with irregular mass measuring approximately 5.6 x 4.4 x 3.3 cm, surrounding soft tissue inflammation and presacral stranding, and some degree of bowel dysmotility given stool more proximally. 2. Additional mass extends superiorly from the mid sigmoid colon along the retroperitoneum on the left side, measuring up 2.6 cm in size. 3. Numerous hepatic lesions have increased significantly in size, measuring up to 6.0 cm, with central calcification, compatible with worsening diffuse metastatic disease the liver. 4. Few small nodules at the left lung base, measuring up to 6 mm in size. These are new or more prominent than on the prior study. Metastatic disease is a concern. 5. 7.4 cm hemangioma at the right hepatic lobe is grossly stable. 6. Nonobstructing left renal stones measure up to 9 mm in size.    ASSESSMENT: SHIFA BRISBON 65 y.o. female with a history of stage IV colon adenocarcinoma, KRAS mutation(+), on capecitabine maintenance therapy..   1. 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 -We again discussed the goal of care is palliative, to preserve her quality of life. -She would like to continue hospice care, but would like to get blood transfusion if needed in our cancer center. -I'll arrange 2 units of blood transfusion this week, and check her blood counts every 3 weeks to see if she needs blood transfusion -I'll change your atenolol to morphine 15 mg every 4-6 hours, as needed for pain control, prescription was sent to her pharmacy last week, she will fill it today  2. Brain metastasis -Status post S/P RT, CT had on 11/25/2015 showed slightly increased the size of metastatic lesion, with persistent surrounding edema. -Clinically stable, we'll continue monitoring   3. PE diagnosed on 11/13/2014 -Continue Xarelto -Due to her significant anemia, I suspect that she has GI bleeding from her tumor, we'll decrease her Xarelto to 15 mg daily   4. Anemia, secondary to chemo, and surgery  -She has intermittent worsening anemia, likely related to her GI bleeding from tumor and underline cancer progression   -continue ferrous sulfate -I'll arrange her lab and a blood transfusion every 3 weeks   5. malnutrition, deconditioning  -Secondary to cancer progression   6. Goal of care -Patient and her family understand her goal of care is palliative and preserve her quality of life -I have discussed CODE STATUS with her several times, last one was a month ago when she was in the hospital. She still wants to be full code, but okay to withdrawal life support after 2 weeks without progress    PLAN -I discussed her lab results with her. Type and cross today, 1 unit of blood today and second unit on Saturday. My nurse discussed with hospice program, which is the patient is covered by hospice service. -I'll schedule her lab on Friday every 3 weeks, and blood transfusion on the following Sunday  -She will start morphine 15 mg as needed every4-6hours she'll hold on fentanyl patch  for  now -She will continue hospice care at home -I'll see her back in 6 weeks   All questions were answered. The patient knows to call the clinic with any problems, questions or concerns. We can certainly see the patient much sooner if necessary.  I spent 20 minutes counseling the patient face to face. The total time spent in the appointment was 25 minutes.  Truitt Merle  03/10/2016

## 2016-03-10 NOTE — Progress Notes (Signed)
Spoke with Reggy Eye, nursing supervisor with [facility].   Per Reggy Eye,  Dr. Lyman Speller stated blood transfusion and/or IVF will be covered by Hospice.

## 2016-03-11 LAB — RETICULOCYTES: RETICULOCYTE COUNT: 2.2 % (ref 0.6–2.6)

## 2016-03-12 ENCOUNTER — Other Ambulatory Visit: Payer: Self-pay | Admitting: Medical Oncology

## 2016-03-13 ENCOUNTER — Ambulatory Visit: Payer: Medicare Other

## 2016-03-13 DIAGNOSIS — D649 Anemia, unspecified: Secondary | ICD-10-CM | POA: Diagnosis not present

## 2016-03-13 DIAGNOSIS — D638 Anemia in other chronic diseases classified elsewhere: Secondary | ICD-10-CM

## 2016-03-13 MED ORDER — SODIUM CHLORIDE 0.9% FLUSH
10.0000 mL | INTRAVENOUS | Status: AC | PRN
  Administered 2016-03-13: 10 mL
  Filled 2016-03-13: qty 10

## 2016-03-13 MED ORDER — SODIUM CHLORIDE 0.9 % IV SOLN
250.0000 mL | Freq: Once | INTRAVENOUS | Status: AC
Start: 2016-03-13 — End: 2016-03-13
  Administered 2016-03-13: 250 mL via INTRAVENOUS

## 2016-03-13 MED ORDER — HEPARIN SOD (PORK) LOCK FLUSH 100 UNIT/ML IV SOLN
500.0000 [IU] | Freq: Every day | INTRAVENOUS | Status: AC | PRN
  Administered 2016-03-13: 500 [IU]
  Filled 2016-03-13: qty 5

## 2016-03-13 NOTE — Patient Instructions (Signed)
Blood Transfusion, Care After °Refer to this sheet in the next few weeks. These instructions provide you with information about caring for yourself after your procedure. Your health care provider may also give you more specific instructions. Your treatment has been planned according to current medical practices, but problems sometimes occur. Call your health care provider if you have any problems or questions after your procedure. °WHAT TO EXPECT AFTER THE PROCEDURE °After your procedure, it is common to have: °· Bruising and soreness at the IV site. °· Chills or fever. °· Headache. °HOME CARE INSTRUCTIONS °· Take medicines only as directed by your health care provider. Ask your health care provider if you can take an over-the-counter pain reliever in case you have a fever or headache a day or two after your transfusion. °· Return to your normal activities as directed by your health care provider. °SEEK MEDICAL CARE IF:  °· You develop redness or irritation at your IV site. °· You have persistent fever, chills, or headache. °· Your urine is darker than normal. °· Your urine turns pink, red, or brown.   °· The white part of your eye turns yellow (jaundice).   °· You feel weak after doing your normal activities.   °SEEK IMMEDIATE MEDICAL CARE IF:  °· You have trouble breathing. °· You have fever and chills along with: °¨ Anxiety. °¨ Chest or back pain. °¨ Flushed skin. °¨ Clammy skin. °¨ A rapid heartbeat. °¨ Nausea. °  °This information is not intended to replace advice given to you by your health care provider. Make sure you discuss any questions you have with your health care provider. °  °Document Released: 07/12/2014 Document Reviewed: 07/12/2014 °Elsevier Interactive Patient Education ©2016 Elsevier Inc. ° °

## 2016-03-15 LAB — TYPE AND SCREEN
ABO/RH(D): AB POS
ANTIBODY SCREEN: NEGATIVE
UNIT DIVISION: 0
Unit division: 0

## 2016-03-22 ENCOUNTER — Telehealth: Payer: Self-pay | Admitting: *Deleted

## 2016-03-25 ENCOUNTER — Encounter: Payer: Self-pay | Admitting: Radiation Therapy

## 2016-03-25 NOTE — Progress Notes (Signed)
[name] [name] [address] [place], [place], died 03/24/16.

## 2016-04-02 ENCOUNTER — Other Ambulatory Visit: Payer: Medicare Other

## 2016-04-04 NOTE — Telephone Encounter (Signed)
Received call from [place], [place] @ [facility] re:   Pt expired at home today Monday 04/20/16 at  315 pm. [facility]    Phone      [phone].

## 2016-04-04 DEATH — deceased

## 2016-04-23 ENCOUNTER — Other Ambulatory Visit: Payer: Medicare Other

## 2016-04-23 ENCOUNTER — Ambulatory Visit: Payer: Medicare Other | Admitting: Hematology

## 2016-04-29 IMAGING — CR DG CHEST 2V
2 series · 2 of 2 positions shown · non-contrast
Comparison: 09/25/2015

CLINICAL DATA: Right-sided weakness beginning several days ago.
Metastatic colon carcinoma.

EXAM:
CHEST  2 VIEW

[chest lat]
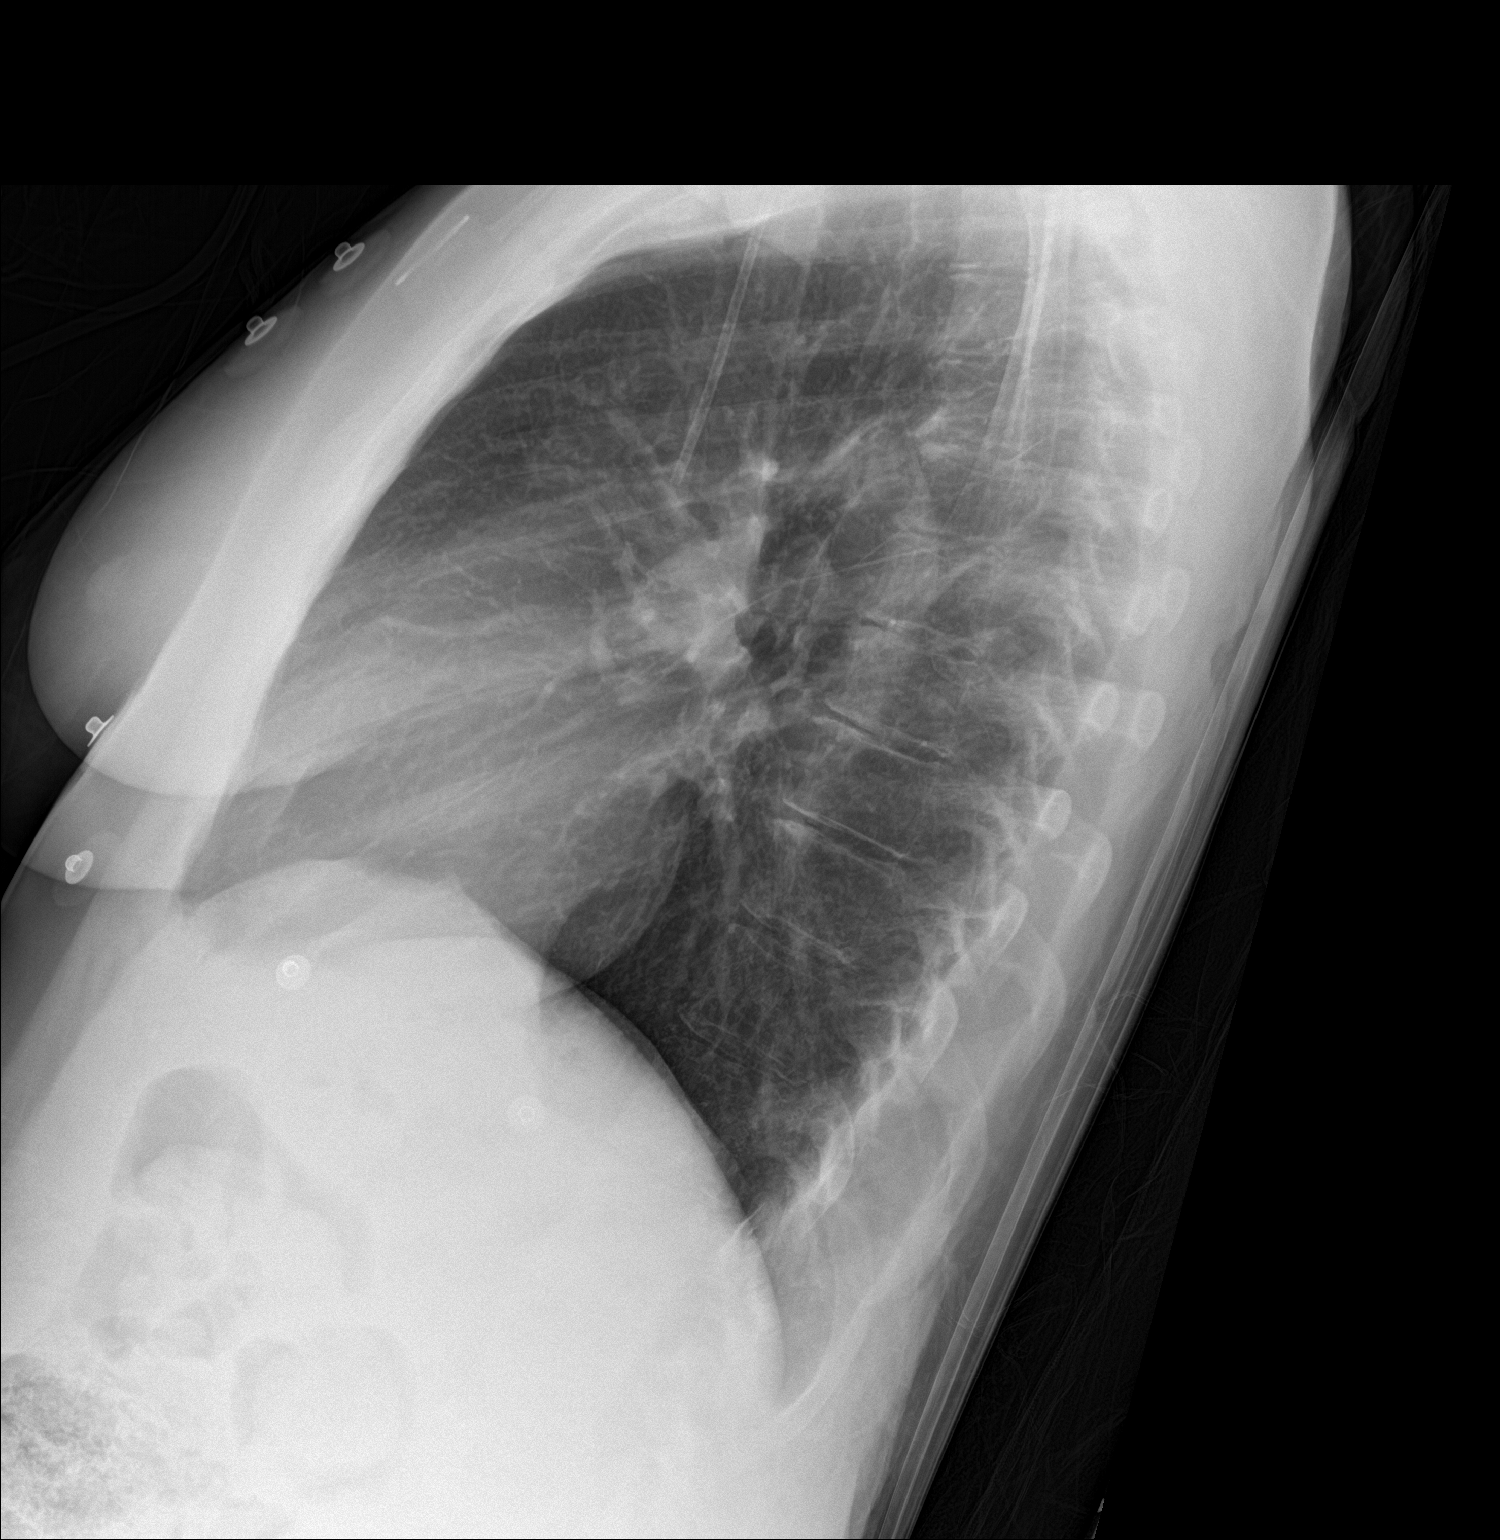

[chest ap]
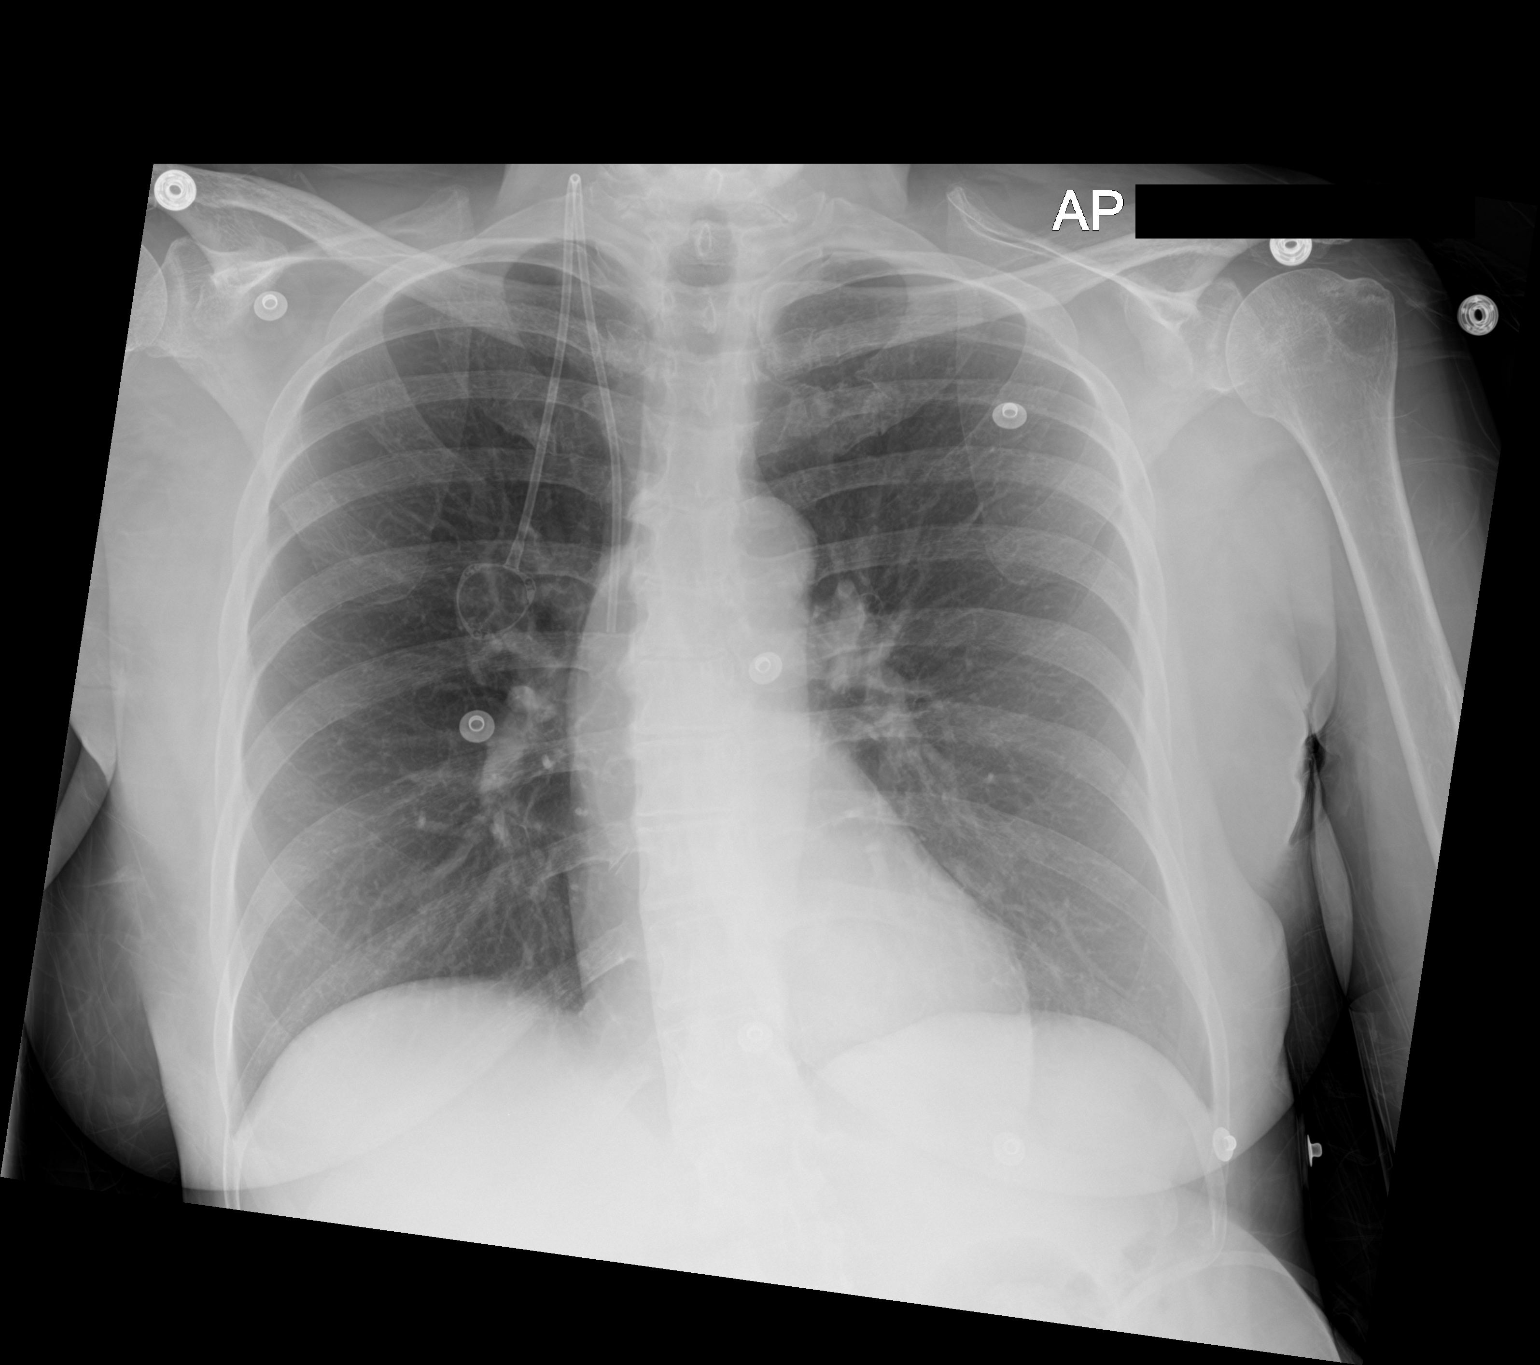

[2 of 2 positions shown; findings below may reference images not displayed]

FINDINGS: Right-sided Port-A-Cath remains in appropriate position. Heart size
is normal. Both lungs are clear. No evidence of pneumothorax or
pleural effusion.
IMPRESSION: No active cardiopulmonary disease.

## 2016-04-29 IMAGING — CT CT HEAD W/O CM
2 series · 15 of 30 positions shown, 19 images · non-contrast
Comparison: 09/15/2015

CLINICAL DATA: Right-sided weakness, history of left parietal
metastatic lesion

EXAM:
CT HEAD WITHOUT CONTRAST
TECHNIQUE: Contiguous axial images were obtained from the base of the skull
through the vertex without intravenous contrast.

[Series 201: head w/o, idose (1) · axial · non-contrast · 0.49mm/px · z∈[+352,+482]mm · 13 of 32 slices shown, 17 images]
[im 3/32  brain]
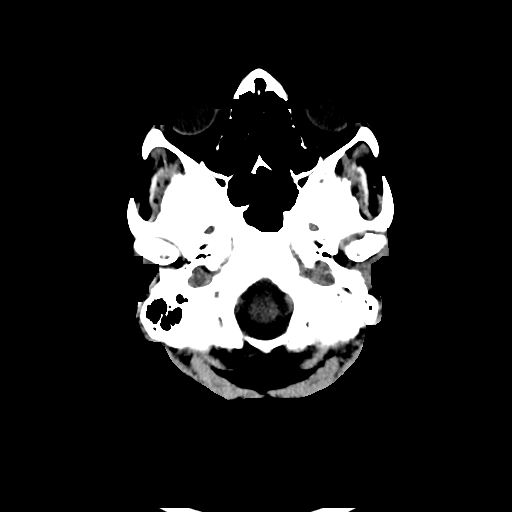
[im 3/32  bone]
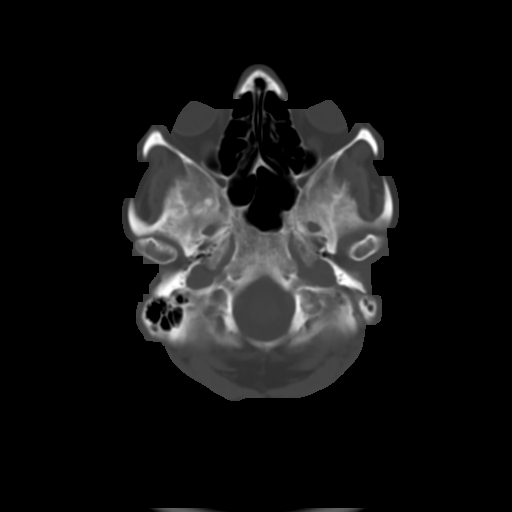
[im 5/32  brain]
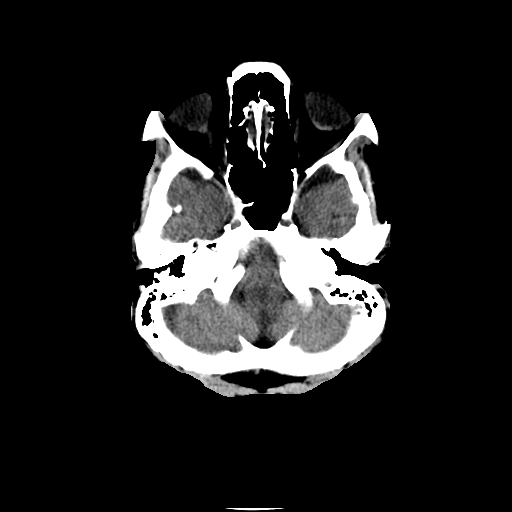
[im 7/32  brain]
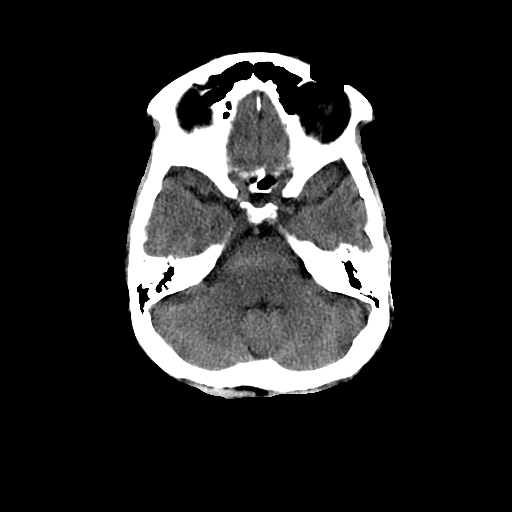
[im 9/32  brain]
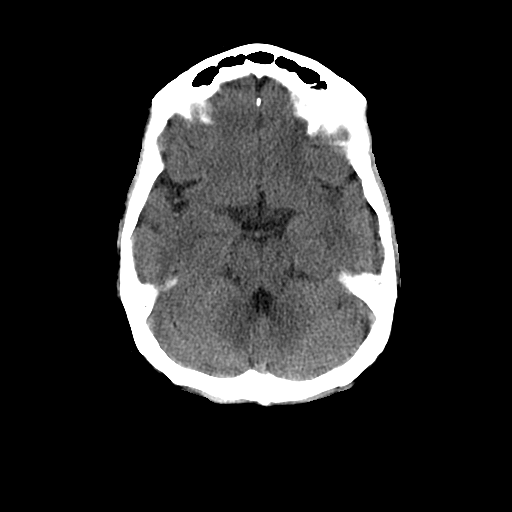
[im 12/32  brain]
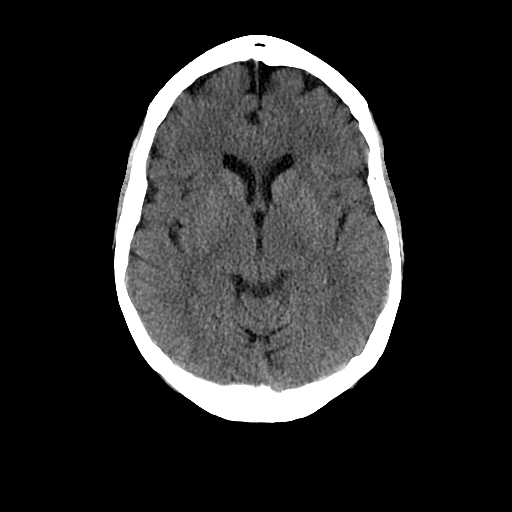
[im 12/32  bone]
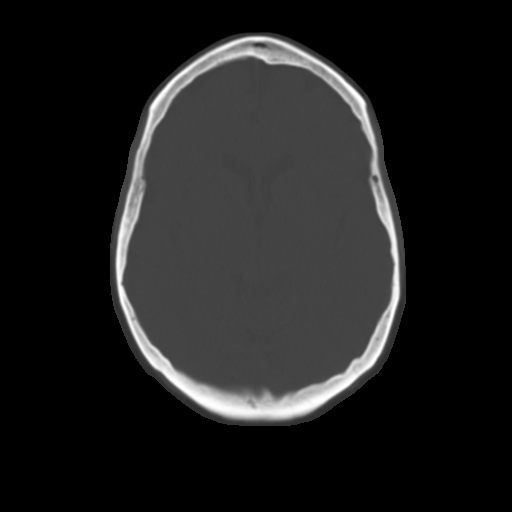
[im 14/32  brain]
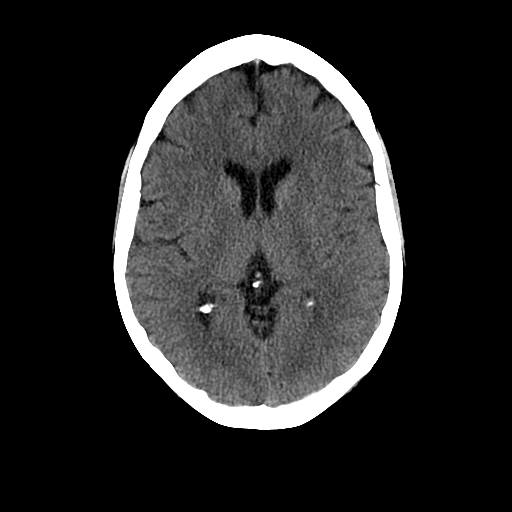
[im 16/32  brain]
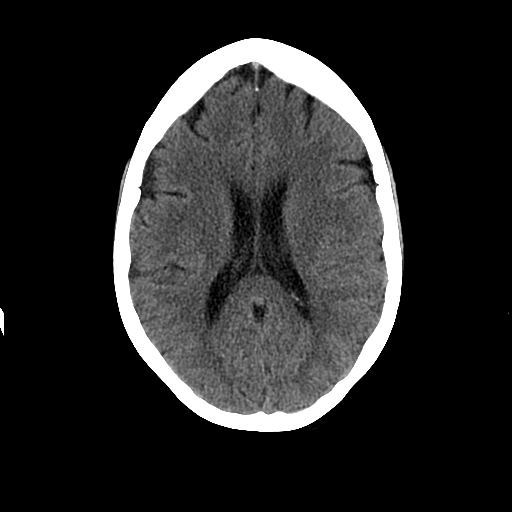
[im 18/32  brain]
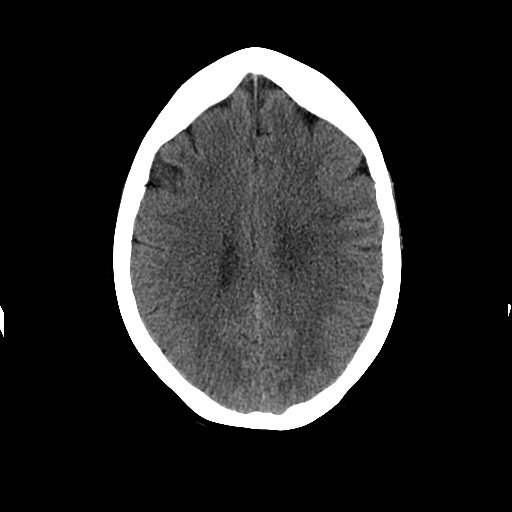
[im 20/32  brain]
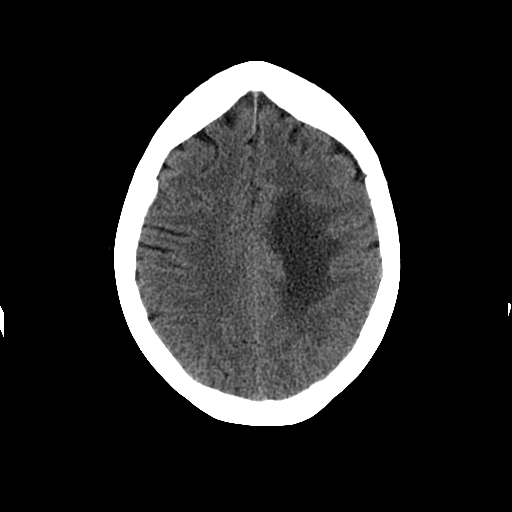
[im 20/32  bone]
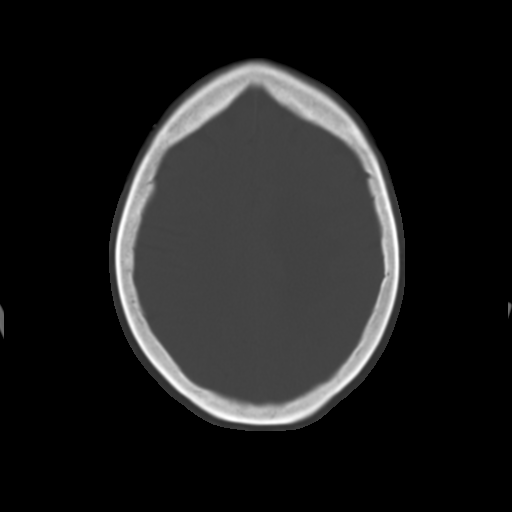
[im 23/32  brain]
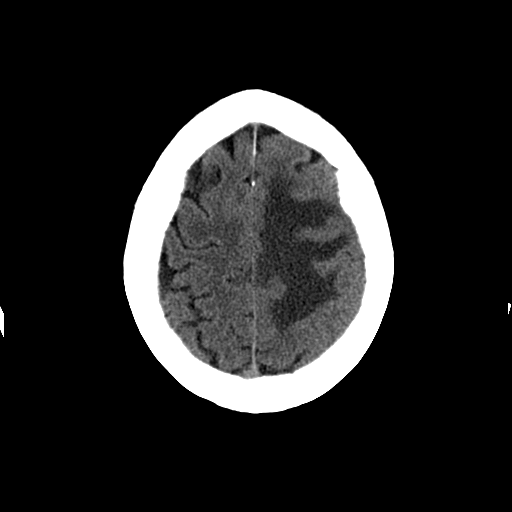
[im 25/32  brain]
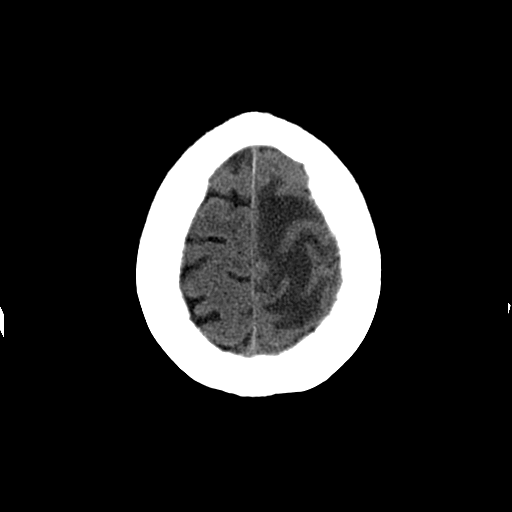
[im 27/32  brain]
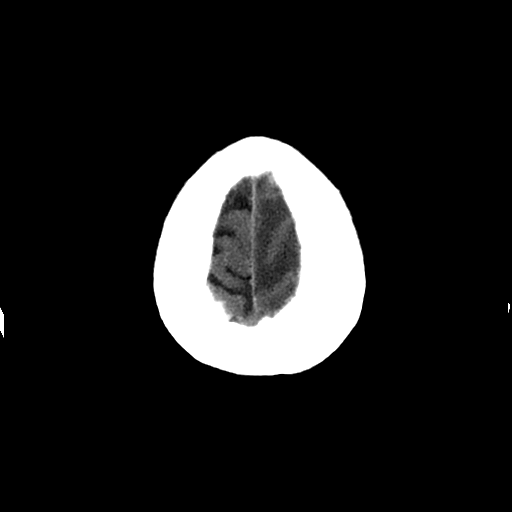
[im 29/32  brain]
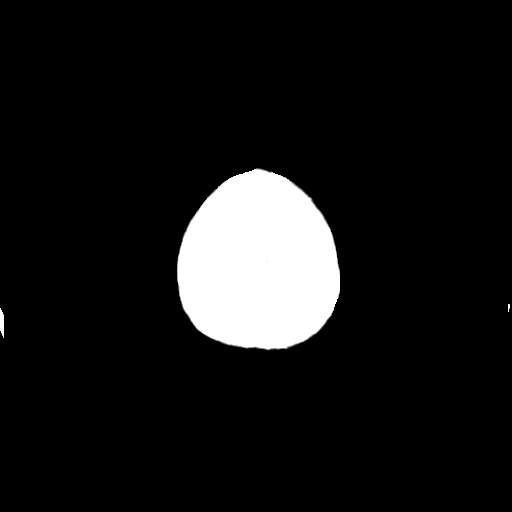
[im 29/32  bone]
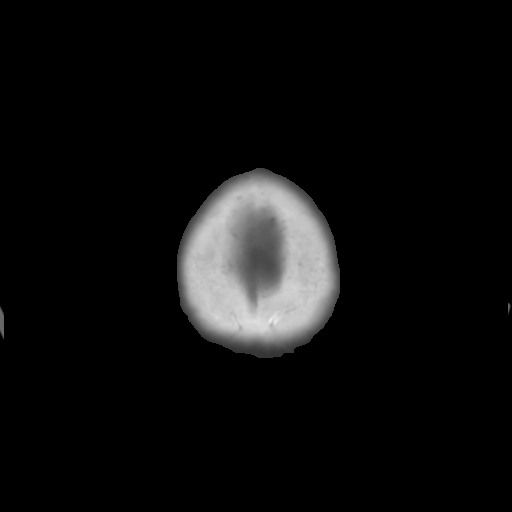

[Series 202: head w/o bone, idose (1) · axial · non-contrast · 0.49mm/px · z∈[+352,+372]mm · 2 of 32 slices shown]
[im 3/32  bone]
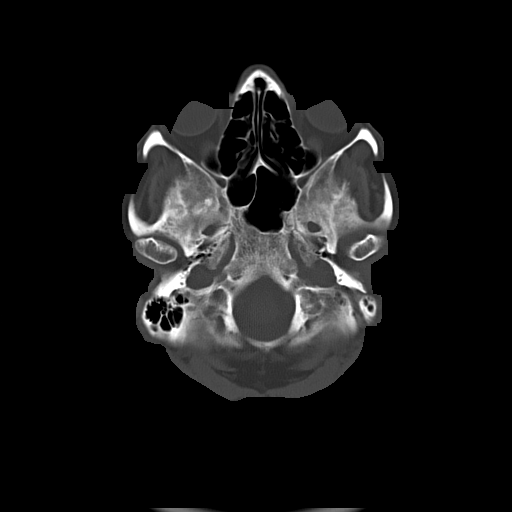
[im 7/32  bone]
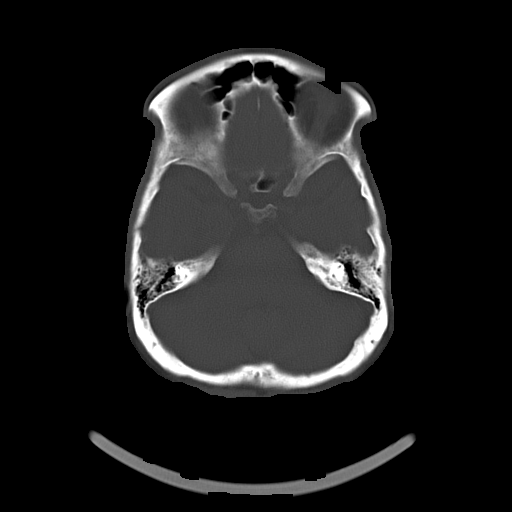

[15 of 30 positions shown; findings below may reference images not displayed]

FINDINGS: The bony calvarium is intact. The known metastatic lesion is again
noted at the vertex in the left parietal lobe similar to that seen
on the prior exam. The degree of surrounding vasogenic edema has
increased from the prior exam now measuring roughly 7.4 by 4.8 cm in
greatest AP and transverse dimensions respectively. No definitive
midline shift is seen. No findings to suggest acute hemorrhage or
acute infarct are noted.
IMPRESSION: Findings consistent with the known metastatic lesion in the left
parietal lobe near the vertex. The degree of surrounding vasogenic
edema has increased in size when compare with the prior exam. This
would correspond with the patient's given clinical history of not
taking prescribed steroids.

## 2016-06-19 ENCOUNTER — Other Ambulatory Visit: Payer: Self-pay | Admitting: Nurse Practitioner
